# Patient Record
Sex: Female | Born: 1935
Health system: Southern US, Community
[De-identification: ages and names within clinical notes are randomized; demographics above are authoritative.]

## PROBLEM LIST (undated history)

## (undated) DIAGNOSIS — M8580 Other specified disorders of bone density and structure, unspecified site: Secondary | ICD-10-CM

## (undated) DIAGNOSIS — E785 Hyperlipidemia, unspecified: Secondary | ICD-10-CM

## (undated) DIAGNOSIS — M65842 Other synovitis and tenosynovitis, left hand: Secondary | ICD-10-CM

## (undated) DIAGNOSIS — D649 Anemia, unspecified: Secondary | ICD-10-CM

## (undated) DIAGNOSIS — E119 Type 2 diabetes mellitus without complications: Secondary | ICD-10-CM

## (undated) DIAGNOSIS — N289 Disorder of kidney and ureter, unspecified: Secondary | ICD-10-CM

## (undated) DIAGNOSIS — R569 Unspecified convulsions: Secondary | ICD-10-CM

## (undated) DIAGNOSIS — Z85038 Personal history of other malignant neoplasm of large intestine: Secondary | ICD-10-CM

## (undated) DIAGNOSIS — I639 Cerebral infarction, unspecified: Secondary | ICD-10-CM

## (undated) HISTORY — PX: REFRACTIVE SURGERY: SHX103

## (undated) HISTORY — DX: Personal history of other malignant neoplasm of large intestine: Z85.038

## (undated) HISTORY — DX: Anemia, unspecified: D64.9

## (undated) HISTORY — PX: CARPAL TUNNEL RELEASE: SHX101

## (undated) HISTORY — PX: TONSILLECTOMY AND ADENOIDECTOMY: SUR1326

## (undated) HISTORY — DX: Disorder of kidney and ureter, unspecified: N28.9

## (undated) HISTORY — DX: Type 2 diabetes mellitus without complications: E11.9

## (undated) HISTORY — DX: Other synovitis and tenosynovitis, left hand: M65.842

## (undated) HISTORY — DX: Cerebral infarction, unspecified: I63.9

## (undated) HISTORY — PX: APPENDECTOMY: SHX54

## (undated) HISTORY — DX: Other specified disorders of bone density and structure, unspecified site: M85.80

## (undated) HISTORY — DX: Hyperlipidemia, unspecified: E78.5

## (undated) HISTORY — DX: Unspecified convulsions: R56.9

## (undated) HISTORY — PX: OTHER SURGICAL HISTORY: SHX169

---

## 1996-06-04 DIAGNOSIS — Z85038 Personal history of other malignant neoplasm of large intestine: Secondary | ICD-10-CM

## 1996-06-04 HISTORY — DX: Personal history of other malignant neoplasm of large intestine: Z85.038

## 1996-06-04 HISTORY — PX: COLECTOMY: SHX59

## 1997-11-19 ENCOUNTER — Other Ambulatory Visit: Admission: RE | Admit: 1997-11-19 | Discharge: 1997-11-19 | Payer: Self-pay | Admitting: Gynecology

## 1999-03-29 ENCOUNTER — Ambulatory Visit (HOSPITAL_COMMUNITY): Admission: RE | Admit: 1999-03-29 | Discharge: 1999-03-29 | Payer: Self-pay | Admitting: Internal Medicine

## 1999-03-29 ENCOUNTER — Encounter: Payer: Self-pay | Admitting: Internal Medicine

## 1999-05-03 ENCOUNTER — Other Ambulatory Visit: Admission: RE | Admit: 1999-05-03 | Discharge: 1999-05-03 | Payer: Self-pay | Admitting: Obstetrics & Gynecology

## 2000-02-23 ENCOUNTER — Encounter: Admission: RE | Admit: 2000-02-23 | Discharge: 2000-03-01 | Payer: Self-pay | Admitting: Orthopedic Surgery

## 2002-07-15 ENCOUNTER — Other Ambulatory Visit: Admission: RE | Admit: 2002-07-15 | Discharge: 2002-07-15 | Payer: Self-pay | Admitting: Obstetrics & Gynecology

## 2004-04-18 ENCOUNTER — Ambulatory Visit: Payer: Self-pay | Admitting: Internal Medicine

## 2004-05-05 ENCOUNTER — Ambulatory Visit: Payer: Self-pay | Admitting: Internal Medicine

## 2004-06-04 HISTORY — PX: CATARACT EXTRACTION: SUR2

## 2004-08-02 ENCOUNTER — Other Ambulatory Visit: Admission: RE | Admit: 2004-08-02 | Discharge: 2004-08-02 | Payer: Self-pay | Admitting: Obstetrics & Gynecology

## 2004-08-21 ENCOUNTER — Ambulatory Visit: Payer: Self-pay | Admitting: Internal Medicine

## 2004-12-21 ENCOUNTER — Ambulatory Visit: Payer: Self-pay | Admitting: Internal Medicine

## 2004-12-26 ENCOUNTER — Ambulatory Visit: Payer: Self-pay | Admitting: Internal Medicine

## 2005-04-05 ENCOUNTER — Ambulatory Visit: Payer: Self-pay | Admitting: Internal Medicine

## 2005-08-21 ENCOUNTER — Ambulatory Visit: Payer: Self-pay | Admitting: Internal Medicine

## 2005-09-03 ENCOUNTER — Ambulatory Visit: Payer: Self-pay | Admitting: Internal Medicine

## 2006-03-11 ENCOUNTER — Ambulatory Visit: Payer: Self-pay | Admitting: Internal Medicine

## 2006-04-05 ENCOUNTER — Ambulatory Visit: Payer: Self-pay | Admitting: Internal Medicine

## 2006-05-03 ENCOUNTER — Ambulatory Visit: Payer: Self-pay | Admitting: Internal Medicine

## 2006-06-04 HISTORY — PX: COLONOSCOPY: SHX174

## 2006-06-19 ENCOUNTER — Ambulatory Visit: Payer: Self-pay | Admitting: Internal Medicine

## 2006-06-19 LAB — CONVERTED CEMR LAB
ALT: 23 units/L (ref 0–40)
AST: 20 units/L (ref 0–37)
BUN: 14 mg/dL (ref 6–23)
Creatinine, Ser: 0.9 mg/dL (ref 0.4–1.2)
Hgb A1c MFr Bld: 6.9 % — ABNORMAL HIGH (ref 4.6–6.0)
Triglycerides: 122 mg/dL (ref 0–149)
VLDL: 24 mg/dL (ref 0–40)

## 2006-07-15 ENCOUNTER — Ambulatory Visit: Payer: Self-pay | Admitting: Internal Medicine

## 2006-10-22 ENCOUNTER — Ambulatory Visit: Payer: Self-pay | Admitting: Internal Medicine

## 2006-10-22 LAB — CONVERTED CEMR LAB
AST: 23 units/L (ref 0–37)
Albumin: 3.9 g/dL (ref 3.5–5.2)
Alkaline Phosphatase: 58 units/L (ref 39–117)
BUN: 11 mg/dL (ref 6–23)
Basophils Absolute: 0 10*3/uL (ref 0.0–0.1)
Calcium: 9.7 mg/dL (ref 8.4–10.5)
Chloride: 109 meq/L (ref 96–112)
Creatinine, Ser: 0.9 mg/dL (ref 0.4–1.2)
Creatinine,U: 67.4 mg/dL
Eosinophils Absolute: 0.2 10*3/uL (ref 0.0–0.6)
GFR calc non Af Amer: 65 mL/min
HCT: 39.3 % (ref 36.0–46.0)
Hgb A1c MFr Bld: 7 % — ABNORMAL HIGH (ref 4.6–6.0)
MCHC: 33.9 g/dL (ref 30.0–36.0)
MCV: 91.5 fL (ref 78.0–100.0)
Monocytes Relative: 9.9 % (ref 3.0–11.0)
Platelets: 206 10*3/uL (ref 150–400)
RBC: 4.29 M/uL (ref 3.87–5.11)
RDW: 12.6 % (ref 11.5–14.6)
Sodium: 141 meq/L (ref 135–145)
TSH: 1.86 microintl units/mL (ref 0.35–5.50)
Total Bilirubin: 0.9 mg/dL (ref 0.3–1.2)

## 2006-10-23 DIAGNOSIS — E785 Hyperlipidemia, unspecified: Secondary | ICD-10-CM

## 2006-10-23 DIAGNOSIS — E1165 Type 2 diabetes mellitus with hyperglycemia: Secondary | ICD-10-CM | POA: Insufficient documentation

## 2006-10-29 ENCOUNTER — Encounter: Payer: Self-pay | Admitting: Internal Medicine

## 2006-10-29 ENCOUNTER — Ambulatory Visit: Payer: Self-pay | Admitting: Internal Medicine

## 2006-12-02 ENCOUNTER — Encounter: Payer: Self-pay | Admitting: Internal Medicine

## 2007-01-15 ENCOUNTER — Ambulatory Visit: Payer: Self-pay | Admitting: Gastroenterology

## 2007-01-30 ENCOUNTER — Encounter: Payer: Self-pay | Admitting: Internal Medicine

## 2007-01-30 ENCOUNTER — Ambulatory Visit: Payer: Self-pay | Admitting: Gastroenterology

## 2007-01-30 ENCOUNTER — Encounter: Payer: Self-pay | Admitting: Gastroenterology

## 2007-04-24 ENCOUNTER — Ambulatory Visit: Payer: Self-pay | Admitting: Internal Medicine

## 2007-06-02 ENCOUNTER — Telehealth (INDEPENDENT_AMBULATORY_CARE_PROVIDER_SITE_OTHER): Payer: Self-pay | Admitting: *Deleted

## 2007-09-03 ENCOUNTER — Encounter: Payer: Self-pay | Admitting: Internal Medicine

## 2007-09-05 ENCOUNTER — Telehealth (INDEPENDENT_AMBULATORY_CARE_PROVIDER_SITE_OTHER): Payer: Self-pay | Admitting: *Deleted

## 2007-11-13 ENCOUNTER — Ambulatory Visit: Payer: Self-pay | Admitting: Internal Medicine

## 2007-11-13 DIAGNOSIS — M8580 Other specified disorders of bone density and structure, unspecified site: Secondary | ICD-10-CM | POA: Insufficient documentation

## 2007-11-13 DIAGNOSIS — Z85038 Personal history of other malignant neoplasm of large intestine: Secondary | ICD-10-CM | POA: Insufficient documentation

## 2007-11-13 DIAGNOSIS — H4020X0 Unspecified primary angle-closure glaucoma, stage unspecified: Secondary | ICD-10-CM | POA: Insufficient documentation

## 2007-11-24 ENCOUNTER — Encounter: Payer: Self-pay | Admitting: Internal Medicine

## 2008-01-28 ENCOUNTER — Ambulatory Visit: Payer: Self-pay | Admitting: Internal Medicine

## 2008-01-28 ENCOUNTER — Encounter (INDEPENDENT_AMBULATORY_CARE_PROVIDER_SITE_OTHER): Payer: Self-pay | Admitting: *Deleted

## 2008-01-28 LAB — CONVERTED CEMR LAB: OCCULT 2: NEGATIVE

## 2008-02-25 DIAGNOSIS — C4491 Basal cell carcinoma of skin, unspecified: Secondary | ICD-10-CM

## 2008-02-25 HISTORY — DX: Basal cell carcinoma of skin, unspecified: C44.91

## 2008-03-10 ENCOUNTER — Ambulatory Visit: Payer: Self-pay | Admitting: Internal Medicine

## 2008-03-15 ENCOUNTER — Encounter (INDEPENDENT_AMBULATORY_CARE_PROVIDER_SITE_OTHER): Payer: Self-pay | Admitting: *Deleted

## 2008-04-20 ENCOUNTER — Telehealth (INDEPENDENT_AMBULATORY_CARE_PROVIDER_SITE_OTHER): Payer: Self-pay | Admitting: *Deleted

## 2008-06-14 ENCOUNTER — Telehealth (INDEPENDENT_AMBULATORY_CARE_PROVIDER_SITE_OTHER): Payer: Self-pay | Admitting: *Deleted

## 2008-09-07 ENCOUNTER — Ambulatory Visit: Payer: Self-pay | Admitting: Internal Medicine

## 2008-09-07 LAB — CONVERTED CEMR LAB: Hgb A1c MFr Bld: 6.9 % — ABNORMAL HIGH (ref 4.6–6.5)

## 2008-09-09 ENCOUNTER — Telehealth (INDEPENDENT_AMBULATORY_CARE_PROVIDER_SITE_OTHER): Payer: Self-pay | Admitting: *Deleted

## 2008-09-09 ENCOUNTER — Encounter (INDEPENDENT_AMBULATORY_CARE_PROVIDER_SITE_OTHER): Payer: Self-pay | Admitting: *Deleted

## 2008-10-04 ENCOUNTER — Ambulatory Visit: Payer: Self-pay | Admitting: Internal Medicine

## 2008-10-15 ENCOUNTER — Encounter (INDEPENDENT_AMBULATORY_CARE_PROVIDER_SITE_OTHER): Payer: Self-pay | Admitting: *Deleted

## 2008-10-21 ENCOUNTER — Encounter: Payer: Self-pay | Admitting: Internal Medicine

## 2008-11-15 ENCOUNTER — Encounter: Payer: Self-pay | Admitting: Internal Medicine

## 2009-01-17 ENCOUNTER — Ambulatory Visit: Payer: Self-pay | Admitting: Internal Medicine

## 2009-01-17 LAB — CONVERTED CEMR LAB
ALT: 14 U/L (ref 0–35)
AST: 17 U/L (ref 0–37)
Albumin: 3.8 g/dL (ref 3.5–5.2)
Alkaline Phosphatase: 49 U/L (ref 39–117)
BUN: 22 mg/dL (ref 6–23)
Basophils Absolute: 0.1 K/uL (ref 0.0–0.1)
Basophils Relative: 1.1 % (ref 0.0–3.0)
Bilirubin, Direct: 0 mg/dL (ref 0.0–0.3)
CO2: 22 meq/L (ref 19–32)
Calcium: 9.3 mg/dL (ref 8.4–10.5)
Chloride: 112 meq/L (ref 96–112)
Cholesterol: 122 mg/dL (ref 0–200)
Creatinine, Ser: 1.3 mg/dL — ABNORMAL HIGH (ref 0.4–1.2)
Creatinine,U: 124.3 mg/dL
Eosinophils Absolute: 0.6 K/uL (ref 0.0–0.7)
Eosinophils Relative: 8.7 % — ABNORMAL HIGH (ref 0.0–5.0)
GFR calc non Af Amer: 42.49 mL/min (ref >60)
Glucose, Bld: 63 mg/dL — ABNORMAL LOW (ref 70–99)
HCT: 34.3 % — ABNORMAL LOW (ref 36.0–46.0)
HDL: 36.5 mg/dL — ABNORMAL LOW (ref >39.00)
Hemoglobin: 11.5 g/dL — ABNORMAL LOW (ref 12.0–15.0)
Hgb A1c MFr Bld: 6.3 % (ref 4.6–6.5)
LDL Cholesterol: 68 mg/dL (ref 0–99)
Lymphocytes Relative: 29.2 % (ref 12.0–46.0)
Lymphs Abs: 2.1 K/uL (ref 0.7–4.0)
MCHC: 33.4 g/dL (ref 30.0–36.0)
MCV: 91.3 fL (ref 78.0–100.0)
Microalb Creat Ratio: 29.8 mg/g (ref 0.0–30.0)
Microalb, Ur: 3.7 mg/dL — ABNORMAL HIGH (ref 0.0–1.9)
Monocytes Absolute: 0.6 K/uL (ref 0.1–1.0)
Monocytes Relative: 8.8 % (ref 3.0–12.0)
Neutro Abs: 3.8 K/uL (ref 1.4–7.7)
Neutrophils Relative %: 52.2 % (ref 43.0–77.0)
Platelets: 163 K/uL (ref 150.0–400.0)
Potassium: 4.3 meq/L (ref 3.5–5.1)
RBC: 3.76 M/uL — ABNORMAL LOW (ref 3.87–5.11)
RDW: 13.9 % (ref 11.5–14.6)
Sodium: 142 meq/L (ref 135–145)
TSH: 1.75 u[IU]/mL (ref 0.35–5.50)
Total Bilirubin: 0.7 mg/dL (ref 0.3–1.2)
Total CHOL/HDL Ratio: 3
Total Protein: 6.7 g/dL (ref 6.0–8.3)
Triglycerides: 87 mg/dL (ref 0.0–149.0)
VLDL: 17.4 mg/dL (ref 0.0–40.0)
WBC: 7.2 10*3/microliter (ref 4.5–10.5)

## 2009-01-21 ENCOUNTER — Telehealth: Payer: Self-pay | Admitting: Internal Medicine

## 2009-01-24 ENCOUNTER — Ambulatory Visit: Payer: Self-pay | Admitting: Internal Medicine

## 2009-01-24 DIAGNOSIS — N183 Chronic kidney disease, stage 3 unspecified: Secondary | ICD-10-CM | POA: Insufficient documentation

## 2009-01-24 DIAGNOSIS — D62 Acute posthemorrhagic anemia: Secondary | ICD-10-CM | POA: Insufficient documentation

## 2009-01-24 DIAGNOSIS — N189 Chronic kidney disease, unspecified: Secondary | ICD-10-CM | POA: Insufficient documentation

## 2009-01-24 DIAGNOSIS — E1122 Type 2 diabetes mellitus with diabetic chronic kidney disease: Secondary | ICD-10-CM

## 2009-02-08 ENCOUNTER — Ambulatory Visit: Payer: Self-pay | Admitting: Internal Medicine

## 2009-02-08 ENCOUNTER — Encounter (INDEPENDENT_AMBULATORY_CARE_PROVIDER_SITE_OTHER): Payer: Self-pay | Admitting: *Deleted

## 2009-02-08 LAB — CONVERTED CEMR LAB: OCCULT 2: NEGATIVE

## 2009-03-31 ENCOUNTER — Ambulatory Visit: Payer: Self-pay | Admitting: Internal Medicine

## 2009-05-05 ENCOUNTER — Telehealth (INDEPENDENT_AMBULATORY_CARE_PROVIDER_SITE_OTHER): Payer: Self-pay | Admitting: *Deleted

## 2009-05-18 ENCOUNTER — Ambulatory Visit: Payer: Self-pay | Admitting: Internal Medicine

## 2009-05-23 LAB — CONVERTED CEMR LAB
Basophils Relative: 1 % (ref 0.0–3.0)
Eosinophils Absolute: 0.2 10*3/uL (ref 0.0–0.7)
Hemoglobin: 11.9 g/dL — ABNORMAL LOW (ref 12.0–15.0)
Lymphocytes Relative: 29 % (ref 12.0–46.0)
MCHC: 33 g/dL (ref 30.0–36.0)
MCV: 91.9 fL (ref 78.0–100.0)
Neutro Abs: 4.5 10*3/uL (ref 1.4–7.7)
RBC: 3.93 M/uL (ref 3.87–5.11)

## 2009-06-08 ENCOUNTER — Ambulatory Visit: Payer: Self-pay | Admitting: Internal Medicine

## 2009-06-08 LAB — CONVERTED CEMR LAB
Ketones, urine, test strip: NEGATIVE
Nitrite: NEGATIVE
Protein, U semiquant: NEGATIVE
Urobilinogen, UA: 0.2

## 2009-06-09 ENCOUNTER — Encounter: Payer: Self-pay | Admitting: Internal Medicine

## 2009-06-10 ENCOUNTER — Telehealth (INDEPENDENT_AMBULATORY_CARE_PROVIDER_SITE_OTHER): Payer: Self-pay | Admitting: *Deleted

## 2009-06-10 ENCOUNTER — Encounter (INDEPENDENT_AMBULATORY_CARE_PROVIDER_SITE_OTHER): Payer: Self-pay | Admitting: *Deleted

## 2009-06-10 LAB — CONVERTED CEMR LAB
BUN: 22 mg/dL (ref 6–23)
Hgb A1c MFr Bld: 6.6 % — ABNORMAL HIGH (ref 4.6–6.5)
Potassium: 5.3 meq/L — ABNORMAL HIGH (ref 3.5–5.1)

## 2009-06-13 ENCOUNTER — Ambulatory Visit: Payer: Self-pay | Admitting: Internal Medicine

## 2009-06-13 ENCOUNTER — Telehealth: Payer: Self-pay | Admitting: Internal Medicine

## 2009-06-13 ENCOUNTER — Encounter: Admission: RE | Admit: 2009-06-13 | Discharge: 2009-06-13 | Payer: Self-pay | Admitting: Internal Medicine

## 2009-06-13 DIAGNOSIS — R1900 Intra-abdominal and pelvic swelling, mass and lump, unspecified site: Secondary | ICD-10-CM | POA: Insufficient documentation

## 2009-06-13 DIAGNOSIS — I10 Essential (primary) hypertension: Secondary | ICD-10-CM | POA: Insufficient documentation

## 2009-06-14 ENCOUNTER — Telehealth (INDEPENDENT_AMBULATORY_CARE_PROVIDER_SITE_OTHER): Payer: Self-pay | Admitting: *Deleted

## 2009-06-14 ENCOUNTER — Encounter (INDEPENDENT_AMBULATORY_CARE_PROVIDER_SITE_OTHER): Payer: Self-pay | Admitting: *Deleted

## 2009-06-15 ENCOUNTER — Encounter: Payer: Self-pay | Admitting: Internal Medicine

## 2009-06-16 ENCOUNTER — Ambulatory Visit (HOSPITAL_COMMUNITY): Admission: RE | Admit: 2009-06-16 | Discharge: 2009-06-16 | Payer: Self-pay | Admitting: Urology

## 2009-07-28 ENCOUNTER — Ambulatory Visit: Payer: Self-pay | Admitting: Internal Medicine

## 2009-07-28 LAB — CONVERTED CEMR LAB
BUN: 21 mg/dL (ref 6–23)
Creatinine, Ser: 1.3 mg/dL — ABNORMAL HIGH (ref 0.4–1.2)

## 2009-08-04 ENCOUNTER — Ambulatory Visit: Payer: Self-pay | Admitting: Internal Medicine

## 2009-08-04 DIAGNOSIS — Z794 Long term (current) use of insulin: Secondary | ICD-10-CM

## 2009-08-04 DIAGNOSIS — E1121 Type 2 diabetes mellitus with diabetic nephropathy: Secondary | ICD-10-CM

## 2009-08-04 DIAGNOSIS — E118 Type 2 diabetes mellitus with unspecified complications: Secondary | ICD-10-CM | POA: Insufficient documentation

## 2009-08-04 DIAGNOSIS — E119 Type 2 diabetes mellitus without complications: Secondary | ICD-10-CM | POA: Insufficient documentation

## 2009-08-10 ENCOUNTER — Telehealth: Payer: Self-pay | Admitting: Internal Medicine

## 2009-08-19 ENCOUNTER — Ambulatory Visit: Payer: Self-pay | Admitting: Internal Medicine

## 2009-08-22 ENCOUNTER — Ambulatory Visit: Payer: Self-pay | Admitting: Internal Medicine

## 2009-08-22 LAB — CONVERTED CEMR LAB
ALT: 13 units/L (ref 0–35)
AST: 17 units/L (ref 0–37)
Alkaline Phosphatase: 69 units/L (ref 39–117)
Bilirubin, Direct: 0.1 mg/dL (ref 0.0–0.3)
Creatinine, Ser: 1.5 mg/dL — ABNORMAL HIGH (ref 0.4–1.2)
Creatinine,U: 91.2 mg/dL
Hgb A1c MFr Bld: 9.8 % — ABNORMAL HIGH (ref 4.6–6.5)
LDL Cholesterol: 97 mg/dL (ref 0–99)
Microalb Creat Ratio: 30.7 mg/g — ABNORMAL HIGH (ref 0.0–30.0)
Microalb, Ur: 2.8 mg/dL — ABNORMAL HIGH (ref 0.0–1.9)
Total CHOL/HDL Ratio: 3
Total Protein: 7.2 g/dL (ref 6.0–8.3)

## 2009-09-26 ENCOUNTER — Ambulatory Visit: Payer: Self-pay | Admitting: Internal Medicine

## 2009-09-29 LAB — CONVERTED CEMR LAB
Creatinine,U: 67.7 mg/dL
Microalb Creat Ratio: 32.5 mg/g — ABNORMAL HIGH (ref 0.0–30.0)

## 2009-10-03 ENCOUNTER — Ambulatory Visit: Payer: Self-pay | Admitting: Internal Medicine

## 2009-10-03 DIAGNOSIS — IMO0001 Reserved for inherently not codable concepts without codable children: Secondary | ICD-10-CM | POA: Insufficient documentation

## 2009-11-14 ENCOUNTER — Telehealth (INDEPENDENT_AMBULATORY_CARE_PROVIDER_SITE_OTHER): Payer: Self-pay | Admitting: *Deleted

## 2009-11-16 ENCOUNTER — Encounter: Payer: Self-pay | Admitting: Internal Medicine

## 2009-11-23 ENCOUNTER — Telehealth (INDEPENDENT_AMBULATORY_CARE_PROVIDER_SITE_OTHER): Payer: Self-pay | Admitting: *Deleted

## 2009-11-25 ENCOUNTER — Encounter (INDEPENDENT_AMBULATORY_CARE_PROVIDER_SITE_OTHER): Payer: Self-pay | Admitting: *Deleted

## 2009-12-13 ENCOUNTER — Ambulatory Visit: Payer: Self-pay | Admitting: Internal Medicine

## 2009-12-13 LAB — CONVERTED CEMR LAB
Albumin: 3.9 g/dL (ref 3.5–5.2)
BUN: 36 mg/dL — ABNORMAL HIGH (ref 6–23)
Basophils Absolute: 0.1 10*3/uL (ref 0.0–0.1)
Basophils Relative: 0.7 % (ref 0.0–3.0)
Cholesterol: 167 mg/dL (ref 0–200)
Eosinophils Absolute: 0.2 10*3/uL (ref 0.0–0.7)
Hemoglobin: 10.8 g/dL — ABNORMAL LOW (ref 12.0–15.0)
LDL Cholesterol: 100 mg/dL — ABNORMAL HIGH (ref 0–99)
Lymphocytes Relative: 28.3 % (ref 12.0–46.0)
MCHC: 33.6 g/dL (ref 30.0–36.0)
MCV: 81.9 fL (ref 78.0–100.0)
Monocytes Absolute: 0.7 10*3/uL (ref 0.1–1.0)
Neutro Abs: 4.6 10*3/uL (ref 1.4–7.7)
Potassium: 4.5 meq/L (ref 3.5–5.1)
RBC: 3.94 M/uL (ref 3.87–5.11)
RDW: 16.5 % — ABNORMAL HIGH (ref 11.5–14.6)
Total Bilirubin: 0.6 mg/dL (ref 0.3–1.2)
Triglycerides: 126 mg/dL (ref 0.0–149.0)
VLDL: 25.2 mg/dL (ref 0.0–40.0)

## 2009-12-16 ENCOUNTER — Ambulatory Visit: Payer: Self-pay | Admitting: Internal Medicine

## 2009-12-26 ENCOUNTER — Encounter: Payer: Self-pay | Admitting: Internal Medicine

## 2010-01-04 ENCOUNTER — Encounter: Payer: Self-pay | Admitting: Internal Medicine

## 2010-01-04 ENCOUNTER — Encounter (INDEPENDENT_AMBULATORY_CARE_PROVIDER_SITE_OTHER): Payer: Self-pay | Admitting: *Deleted

## 2010-03-06 ENCOUNTER — Encounter: Payer: Self-pay | Admitting: Internal Medicine

## 2010-03-09 ENCOUNTER — Encounter (INDEPENDENT_AMBULATORY_CARE_PROVIDER_SITE_OTHER): Payer: Self-pay | Admitting: *Deleted

## 2010-03-13 ENCOUNTER — Ambulatory Visit: Payer: Self-pay | Admitting: Gastroenterology

## 2010-03-27 ENCOUNTER — Ambulatory Visit: Payer: Self-pay | Admitting: Gastroenterology

## 2010-04-18 ENCOUNTER — Ambulatory Visit: Payer: Self-pay | Admitting: Internal Medicine

## 2010-04-24 LAB — CONVERTED CEMR LAB
Basophils Relative: 0.5 % (ref 0.0–3.0)
Creatinine, Ser: 1.4 mg/dL — ABNORMAL HIGH (ref 0.4–1.2)
Eosinophils Absolute: 0.2 10*3/uL (ref 0.0–0.7)
Eosinophils Relative: 2.7 % (ref 0.0–5.0)
Folate: >20 ng/mL
HCT: 33.9 % — ABNORMAL LOW (ref 36.0–46.0)
Hemoglobin: 11.1 g/dL — ABNORMAL LOW (ref 12.0–15.0)
Lymphs Abs: 1.8 10*3/uL (ref 0.7–4.0)
MCHC: 32.6 g/dL (ref 30.0–36.0)
MCV: 82.3 fL (ref 78.0–100.0)
Monocytes Absolute: 0.6 10*3/uL (ref 0.1–1.0)
Neutro Abs: 4.5 10*3/uL (ref 1.4–7.7)
RBC: 4.12 M/uL (ref 3.87–5.11)
WBC: 7.1 10*3/uL (ref 4.5–10.5)

## 2010-04-25 ENCOUNTER — Ambulatory Visit: Payer: Self-pay | Admitting: Internal Medicine

## 2010-06-19 ENCOUNTER — Ambulatory Visit
Admission: RE | Admit: 2010-06-19 | Discharge: 2010-06-19 | Payer: Self-pay | Source: Home / Self Care | Attending: Internal Medicine | Admitting: Internal Medicine

## 2010-06-19 ENCOUNTER — Other Ambulatory Visit: Payer: Self-pay | Admitting: Internal Medicine

## 2010-06-19 LAB — POTASSIUM: Potassium: 4.5 mEq/L (ref 3.5–5.1)

## 2010-06-19 LAB — BUN: BUN: 31 mg/dL — ABNORMAL HIGH (ref 6–23)

## 2010-06-19 LAB — CREATININE, SERUM: Creatinine, Ser: 1.3 mg/dL — ABNORMAL HIGH (ref 0.4–1.2)

## 2010-06-19 LAB — HEMOGLOBIN A1C: Hgb A1c MFr Bld: 10.7 % — ABNORMAL HIGH (ref 4.6–6.5)

## 2010-06-26 ENCOUNTER — Ambulatory Visit
Admission: RE | Admit: 2010-06-26 | Discharge: 2010-06-26 | Payer: Self-pay | Source: Home / Self Care | Attending: Internal Medicine | Admitting: Internal Medicine

## 2010-06-28 ENCOUNTER — Telehealth: Payer: Self-pay | Admitting: Internal Medicine

## 2010-06-29 ENCOUNTER — Encounter: Payer: Self-pay | Admitting: Internal Medicine

## 2010-07-02 LAB — CONVERTED CEMR LAB
Albumin: 4.1 g/dL (ref 3.5–5.2)
BUN: 16 mg/dL (ref 6–23)
Basophils Absolute: 0 10*3/uL (ref 0.0–0.1)
Basophils Absolute: 0.1 10*3/uL (ref 0.0–0.1)
Basophils Relative: 0.5 % (ref 0.0–1.0)
Cholesterol: 141 mg/dL (ref 0–200)
Creatinine, Ser: 0.9 mg/dL (ref 0.4–1.2)
Creatinine,U: 113.9 mg/dL
Eosinophils Absolute: 0.2 10*3/uL (ref 0.0–0.7)
Eosinophils Relative: 2.5 % (ref 0.0–5.0)
GFR calc Af Amer: 79 mL/min
GFR calc non Af Amer: 65 mL/min
HCT: 39.4 % (ref 36.0–46.0)
HDL: 35 mg/dL — ABNORMAL LOW (ref >39.0)
Hgb A1c MFr Bld: 6.5 % — ABNORMAL HIGH (ref 4.6–6.0)
Iron: 76 ug/dL (ref 42–145)
Lymphocytes Relative: 26.4 % (ref 12.0–46.0)
MCHC: 33.7 g/dL (ref 30.0–36.0)
MCV: 91.9 fL (ref 78.0–100.0)
Microalb Creat Ratio: 19.3 mg/g (ref 0.0–30.0)
Microalb, Ur: 2.2 mg/dL — ABNORMAL HIGH (ref 0.0–1.9)
Monocytes Absolute: 0.6 10*3/uL (ref 0.1–1.0)
Monocytes Relative: 8.1 % (ref 3.0–12.0)
Platelets: 172 10*3/uL (ref 150.0–400.0)
Platelets: 206 10*3/uL (ref 150–400)
RBC: 4.29 M/uL (ref 3.87–5.11)
RDW: 14 % (ref 11.5–14.6)
Total Bilirubin: 0.8 mg/dL (ref 0.3–1.2)
Transferrin: 271 mg/dL (ref 212.0–360.0)
VLDL: 20 mg/dL (ref 0–40)
Vitamin B-12: 872 pg/mL (ref 211–911)
WBC: 7.1 10*3/uL (ref 4.5–10.5)

## 2010-07-06 NOTE — Progress Notes (Signed)
Summary: Refill Request  Phone Note Refill Request Message from:  Patient on November 14, 2009 8:39 AM  Refills Requested: Medication #1:  LIPITOR 40 MG  TABS take one tablet at bedtime   Dosage confirmed as above?Dosage Confirmed   Supply Requested: 1 month Please call in rx to [facility] in [place]. Patients phone number [phone]  Initial call taken by: Harold Barban,  November 14, 2009 8:41 AM    Prescriptions: LIPITOR 40 MG  TABS (ATORVASTATIN CALCIUM) take one tablet at bedtime  #90 x 3   Entered by:   Shonna Chock   Authorized by:   Marga Melnick MD   Signed by:   Shonna Chock on 11/14/2009   Method used:   Electronically to        [facility]. [address] [phone]* (retail)       [address], [place]  [address]       Ph: [phone]       Fax: [phone]   RxID:   [id number]

## 2010-07-06 NOTE — Consult Note (Signed)
Summary: [facility]  [facility]   Imported By: Lanelle Bal 06/27/2009 10:37:09  _____________________________________________________________________  External Attachment:    Type:   Image     Comment:   External Document

## 2010-07-06 NOTE — Progress Notes (Signed)
Summary: start med  Phone Note Call from Patient Call back at [phone]   Caller: Daughter Summary of Call: pt was rx 2 meds today and is unsure if she still need to get these med since see is not going to get CT of the abdomen and pelvis. pt rx HYDROCODONE-ACETAMINOPHEN 5-500 MG TAB dr hopper pls advise  Follow-up for Phone Call        pt aware. to pick med up if pain persist  and other med is bp med...........Marland KitchenFelecia Deloach CMA  June 13, 2009 5:28 PM

## 2010-07-06 NOTE — Assessment & Plan Note (Signed)
Summary: rto 4 months/cbs   Vital Signs:  Patient profile:   77 year old female Weight:      133.6 pounds BMI:     26.19 Pulse rate:   72 / minute Resp:     16 per minute BP sitting:   136 / 70  (left arm) Cuff size:   large  Vitals Entered By: Shonna Chock CMA (April 25, 2010 8:59 AM) CC: 4 month follow-up ( copy of labs given) , Diarrhea, Type 2 diabetes mellitus follow-up   CC:  4 month follow-up ( copy of labs given) , Diarrhea, and Type 2 diabetes mellitus follow-up.  History of Present Illness:       Labs reviewed ; severely uncontrolled DM .A1c 10.6% which means average sugar = 258 & risk 112%. She increased Lantus to 10 u in am & 5 u at bedtime  rather than continuing once daily dosing.        An acute interval issue is diarrhea.She had diarrhea  only on 11/19  .  The patient reports 4 stools per day and semiformed/loose stools, "black" but w/o  blood in stool, mucus in stool, greasy stools, malodorous stools, or  fecal urgency.  Associated symptoms include nausea and vomiting X 2 on 11/19.  The patient denied  fever, abdominal pain, abdominal cramps, and lightheadedness. Rx: Pepto - Bismol with response.  She is now just weak. Type 2 Diabetes Mellitus Follow-Up        The patient reports weight loss, but denies polyuria, polydipsia, blurred vision, self managed hypoglycemia, and numbness of extremities.  The patient denies the following symptoms: neuropathic pain, chest pain, orthostatic symptoms, poor wound healing, vision loss, and foot ulcer.  Since the last visit the patient reports fair  dietary compliance and not exercising regularly.  The patient has been measuring capillary blood glucose before breakfast ( 150-170), 2 hrs after  lunch  and after dinner( < 280).    Current Medications (verified): 1)  Januvia 100 Mg  Tabs (Sitagliptin Phosphate) .... Take 1/2  Tablet By Mouth Every Day 2)  Freestyle Lite   Strp (Glucose Blood) .... Check Up To Three Times Daily As  Directed 3)  Lipitor 40 Mg  Tabs (Atorvastatin Calcium) .... Take One Tablet At Bedtime 4)  Ramipril 10 .Marland Kitchen.. 1 Once Daily 5)  Carvedilol 12.5 Mg Tabs (Carvedilol) .... Take 1 Tab Two Times A Day 6)  Lantus Solostar 100 Unit/ml Soln (Insulin Glargine) .Marland Kitchen.. 10 Units in Am, 5 Units Pm 7)  Bd Pen Needles 31g X 5/16 .Marland Kitchen.. 10units Daily and Increase By 2 Nightly Util Fbs Is  150-180 On Average .  Dx: 250.00 8)  Furosemide 40 Mg Tabs (Furosemide) .... Take 1 Tab Once Daily 9)  Osteo Bi-Flex Joint Shield  Tabs (Misc Natural Products) .Marland Kitchen.. 1 By Mouth Once Daily 10)  Aspirin 81 Mg Tabs (Aspirin) .Marland Kitchen.. 1 By Mouth Once Daily 11)  Vitamin D3 1000 Unit Tabs (Cholecalciferol) .Marland Kitchen.. 1 By Mouth Once Daily 12)  Folic Acid 400 Mcg Tabs (Folic Acid) .Marland Kitchen.. 1 By Mouth Once Daily 13)  Fish Oil 1000 Mg Caps (Omega-3 Fatty Acids) .Marland Kitchen.. 1 By Mouth Once Daily 14)  Calcium 600 Mg Tabs (Calcium) .Marland Kitchen.. 1 By Mouth Once Daily  Allergies: 1)  ! Pravachol  Physical Exam  General:  well-nourished,in no acute distress; alert,appropriate and cooperative throughout examination Eyes:  No corneal or conjunctival inflammation noted.No icterus Mouth:  Oral mucosa and oropharynx without lesions or exudates.  Tongue slightly dry. No pharyngeal erythema.   Lungs:  Normal respiratory effort, chest expands symmetrically. Lungs are clear to auscultation, no crackles or wheezes. Heart:  normal rate, regular rhythm, no gallop, no rub, no JVD, no HJR, and grade 1/2  /6 systolic murmur.   Abdomen:  Bowel sounds positive,abdomen soft and non-tender without masses, organomegaly or hernias noted. Pulses:  R and L carotid,radia  pulses are full and equal bilaterally. Pedapulses slightly decreased Extremities:  No clubbing, cyanosis, edema. Nails painted Neurologic:  alert & oriented X3 and sensation decreased  to light touch over feet except R sole.   Skin:  Intact without suspicious lesions or rashes. No jaundice. Slight tenting Cervical Nodes:   No lymphadenopathy noted Axillary Nodes:  No palpable lymphadenopathy Psych:  memory intact for recent and remote, normally interactive, and good eye contact.     Impression & Recommendations:  Problem # 1:  DIARRHEA (ICD-787.91) self limited  Problem # 2:  VOMITING (ICD-787.03) self limited  Problem # 3:  DIABETES MELLITUS, UNCONTROLLED (ICD-250.02)  The following medications were removed from the medication list:    Januvia 100 Mg Tabs (Sitagliptin phosphate) .Marland Kitchen... Take 1/2  tablet by mouth every day Her updated medication list for this problem includes:    Lantus Solostar 100 Unit/ml Soln (Insulin glargine) .Marland KitchenMarland KitchenMarland KitchenMarland Kitchen 10 units in am, 5 units pm    Aspirin 81 Mg Tabs (Aspirin) .Marland Kitchen... 1 by mouth once daily    Tradjenta 5 Mg Tabs (Linagliptin) .Marland Kitchen... 1 once daily with largest meal  Problem # 4:  RENAL INSUFFICIENCY (ICD-588.9)  Complete Medication List: 1)  Freestyle Lite Strp (Glucose blood) .... Check up to three times daily as directed 2)  Lipitor 40 Mg Tabs (Atorvastatin calcium) .... Take one tablet at bedtime 3)  Ramipril 10  .Marland Kitchen.. 1 once daily 4)  Carvedilol 12.5 Mg Tabs (Carvedilol) .... Take 1 tab two times a day 5)  Lantus Solostar 100 Unit/ml Soln (Insulin glargine) .Marland Kitchen.. 10 units in am, 5 units pm 6)  Bd Pen Needles 31g X 5/16  .Marland Kitchen.. 10units daily and increase by 2 nightly util fbs is  150-180 on average .  dx: 250.00 7)  Furosemide 40 Mg Tabs (Furosemide) .... Take 1 tab once daily 8)  Osteo Bi-flex Joint Shield Tabs (Misc natural products) .Marland Kitchen.. 1 by mouth once daily 9)  Aspirin 81 Mg Tabs (Aspirin) .Marland Kitchen.. 1 by mouth once daily 10)  Vitamin D3 1000 Unit Tabs (Cholecalciferol) .Marland Kitchen.. 1 by mouth once daily 11)  Folic Acid 400 Mcg Tabs (Folic acid) .Marland Kitchen.. 1 by mouth once daily 12)  Fish Oil 1000 Mg Caps (Omega-3 fatty acids) .Marland Kitchen.. 1 by mouth once daily 13)  Calcium 600 Mg Tabs (Calcium) .Marland Kitchen.. 1 by mouth once daily 14)  Tradjenta 5 Mg Tabs (Linagliptin) .Marland Kitchen.. 1 once daily with largest  meal  Patient Instructions: 1)  Avoid High Fructose Corn Syrup sugar as discussed.  Increase Lantus ONCE daily dose by 2 units / day every Mon if previous week's fasting glucose averages > 150 ( Ex increase to 17 units this week , etc) . 2)  Please schedule a follow-up appointment in 2 months. 3)  BUN,creat ,K+ prior to visit, ICD-9:588.9 4)  HbgA1C prior to visit, ICD-9:250.02 Prescriptions: TRADJENTA 5 MG TABS (LINAGLIPTIN) 1 once daily with largest meal  #30 x 2   Entered and Authorized by:   Marga Melnick MD   Signed by:   Marga Melnick MD on 04/25/2010   Method used:  Print then Give to Patient   RxID:   [id number]    Orders Added: 1)  Est. Patient Level IV [14782]

## 2010-07-06 NOTE — Letter (Signed)
Summary: Results Follow up Letter  [facility]  [address] [place], [place] [address]   Phone: [phone]  Fax: [phone]    03/15/2008 MRN: [id number]  Taylor Castaneda [address] [place], [place]  [address]  Dear [name],  The following are the results of your recent test(s):  Test         Result    Pap Smear:        Normal _____  Not Normal _____ Comments: ______________________________________________________ Cholesterol: LDL(Bad cholesterol):         Your goal is less than:         HDL (Good cholesterol):       Your goal is more than: Comments:  ______________________________________________________ Mammogram:        Normal _____  Not Normal _____ Comments:  ___________________________________________________________________ Hemoccult:        Normal _____  Not normal _______ Comments:    _____________________________________________________________________ Other Tests: PLEASE SEE ATTACHED LABS FROM 03/10/08 AND COMMENTS    We routinely do not discuss normal results over the telephone.  If you desire a copy of the results, or you have any questions about this information we can discuss them at your next office visit.   Sincerely,

## 2010-07-06 NOTE — Assessment & Plan Note (Signed)
Summary: f/u labs   Vital Signs:  Patient profile:   77 year old female Weight:      131.2 pounds Pulse rate:   64 / minute Resp:     17 per minute BP sitting:   148 / 72  (left arm) Cuff size:   large  Vitals Entered By: Shonna Chock (August 22, 2009 2:53 PM) CC: 1.) Follow-up on lbas (copy givne), refill meds if to continue  2.) ? If B/P med causing leg numbness-symptom started when med was increased Comments REVIEWED MED LIST, PATIENT AGREED DOSE AND INSTRUCTION CORRECT    CC:  1.) Follow-up on lbas (copy givne) and refill meds if to continue  2.) ? If B/P med causing leg numbness-symptom started when med was increased.  History of Present Illness: Labs reviewed ; A1c 9.8%, creat 1.5. Creat was 1.8 in 06/2009. BP dropping; average 134/69. FBS also dropping ; the FBS initially was > 250, now 130-180 range on 10 units of insulin.Two hours after a eal up to 400.Taking Januvia in am. No hypoglycemia. She has "semi numbness " of  feet & legs to knees bilaterally since Ramipril , insulin & Carvedilol  started  Allergies: 1)  ! Pravachol  Review of Systems GU:  Denies incontinence. Neuro:  Complains of numbness; denies brief paralysis, tingling, and weakness.  Physical Exam  General:   alert,appropriate and cooperative throughout examination Pulses:  R and L dorsalis pedis and posterior tibial pulses are full and equal bilaterally Extremities:  No clubbing, cyanosis, edema. Neurologic:  alert & oriented X3, strength normal in all extremities, sensation intact to light touch but sensation variable  to pinprick over legs , and DTRs symmetrical and normal.     Impression & Recommendations:  Problem # 1:  DIABETES MELLITUS, UNCONTROLLED (ICD-250.02)  Her updated medication list for this problem includes:    Januvia 100 Mg Tabs (Sitagliptin phosphate) .Marland Kitchen... Take one tablet by mouth every day    Lantus Solostar 100 Unit/ml Soln (Insulin glargine) .Marland KitchenMarland KitchenMarland KitchenMarland Kitchen 10 units at bedtime  Complete  Medication List: 1)  Januvia 100 Mg Tabs (Sitagliptin phosphate) .... Take one tablet by mouth every day 2)  Freestyle Lite Strp (Glucose blood) .... Check up to three times daily as directed 3)  Lipitor 40 Mg Tabs (Atorvastatin calcium) .... Take one tablet at bedtime 4)  Ramipril 10  .Marland Kitchen.. 1 once daily 5)  Carvedilol 12.5 Mg Tabs (Carvedilol) .... Take 1 tab two times a day 6)  Lantus Solostar 100 Unit/ml Soln (Insulin glargine) .Marland Kitchen.. 10 units at bedtime 7)  Bd Pen Needles 31g X 5/16  .Marland Kitchen.. 10units daily and increase by 2 nightly util fbs is  150-180 on average .  dx: 250.00 8)  Furosemide 40 Mg Tabs (Furosemide) .... Take 1 tab once daily  Patient Instructions: 1)  Check your blood sugars regularly. If your readings are usually above 150 on average prior week increase Lantus daily dose by 2 units  . Call if FBS  below 90 you should contact our office. 2)  Please schedule a follow-up appointment in 2 months. 3)  BUN,creat, K+ prior to visit, ICD-9: 4)  HbgA1C prior to visit, ICD-9:

## 2010-07-06 NOTE — Assessment & Plan Note (Signed)
Summary: rto 2 months review lab/cbs   Vital Signs:  Patient profile:   77 year old female Weight:      136.4 pounds Pulse rate:   72 / minute Resp:     17 per minute BP sitting:   130 / 78  (left arm) Cuff size:   large  Vitals Entered By: Shonna Chock CMA (December 16, 2009 10:03 AM) CC: 2 Month follow-up, discuss labs (copy given). BS Average lowest:158, Highest:25, Type 2 diabetes mellitus follow-up Comments REVIEWED MED LIST, PATIENT AGREED DOSE AND INSTRUCTION CORRECT    CC:  2 Month follow-up, discuss labs (copy given). BS Average lowest:158, Highest:25, and Type 2 diabetes mellitus follow-up.  History of Present Illness: Labs reviewed & risks discussed. A1c 9.7%(average sugar 232 & 94% increased risk(prev A1c 10%). The patient reports weight gain of 5#, but denies polyuria, polydipsia, blurred vision, self managed hypoglycemia, and numbness of extremities.  The patient denies the following symptoms: neuropathic pain, chest pain, vomiting, orthostatic symptoms, poor wound healing, intermittent claudication, vision loss, and foot ulcer.  Since the last visit the patient reports fair dietary compliance ("many weddings"), compliance with medications, not exercising regularly, and monitoring blood glucose.  The patient has been measuring capillary blood glucose before breakfast  (average 158 in May & 173 in June)and after dinner (< 256).  Since the last visit, the patient reports having had eye care by an ophthalmologist and no foot care. Hyperlipidemia Follow-Up      The patient also presents for Hyperlipidemia follow-up.  The patient denies muscle aches, GI upset, abdominal pain, flushing, itching, constipation, diarrhea, and fatigue.  The patient denies the following symptoms: chest pain/pressure, exercise intolerance, dypsnea, palpitations, syncope, and pedal edema.  Compliance with medications (by patient report) has been near 100%.  Adjunctive measures currently used by the patient  include ASA and fish oil supplements. Lipids are @ goal.                                                   Anemia slightly worse than in 08/10(HCT 34.7) but creat up to 1.5 from 1.4.Recalled for Colonoscopy .  Allergies: 1)  ! Pravachol  Review of Systems ENT:  Denies nosebleeds. Resp:  Denies coughing up blood. GI:  Denies bloody stools, dark tarry stools, and indigestion. GU:  Denies hematuria. Heme:  Denies abnormal bruising and bleeding.  Physical Exam  General:  Appears younger than age;well-nourished,in no acute distress; alert,appropriate and cooperative throughout examination Lungs:  Normal respiratory effort, chest expands symmetrically. Lungs are clear to auscultation, no crackles or wheezes. Heart:  Normal rate and regular rhythm. S1 and S2 normal without gallop, murmur, click, rub .S4 Abdomen:  Bowel sounds positive,abdomen soft and non-tender without masses, organomegaly or hernias noted. Pulses:  R and L carotid,radial,dorsalis pedis and posterior tibial pulses are full and equal bilaterally Extremities:  No clubbing, cyanosis, edema.Good nail health Neurologic:  alert & oriented X3 and sensation intact to light touch over feet.   Skin:  Intact without suspicious lesions or rashes Psych:  memory intact for recent and remote, normally interactive, and good eye contact.     Impression & Recommendations:  Problem # 1:  DIABETES MELLITUS, UNCONTROLLED (ICD-250.02)  Her updated medication list for this problem includes:    Januvia 100 Mg Tabs (Sitagliptin phosphate) .Marland Kitchen... Take 1/2  tablet  by mouth every day    Lantus Solostar 100 Unit/ml Soln (Insulin glargine) .Marland KitchenMarland KitchenMarland KitchenMarland Kitchen 10 units at bedtime    Aspirin 81 Mg Tabs (Aspirin) .Marland Kitchen... 1 by mouth once daily  Problem # 2:  HYPERTENSION (ICD-401.9) controlled Her updated medication list for this problem includes:    Carvedilol 12.5 Mg Tabs (Carvedilol) .Marland Kitchen... Take 1 tab two times a day    Furosemide 40 Mg Tabs (Furosemide) .Marland Kitchen... Take  1 tab once daily  Problem # 3:  RENAL INSUFFICIENCY (ICD-588.9)  Problem # 4:  ANEMIA-NOS (ICD-285.9) probably  Her updated medication list for this problem includes:    Folic Acid 400 Mcg Tabs (Folic acid) .Marland Kitchen... 1 by mouth once daily  Problem # 5:  HYPERLIPIDEMIA (ICD-272.4) Lipids @ goal Her updated medication list for this problem includes:    Lipitor 40 Mg Tabs (Atorvastatin calcium) .Marland Kitchen... Take one tablet at bedtime  Complete Medication List: 1)  Januvia 100 Mg Tabs (Sitagliptin phosphate) .... Take 1/2  tablet by mouth every day 2)  Freestyle Lite Strp (Glucose blood) .... Check up to three times daily as directed 3)  Lipitor 40 Mg Tabs (Atorvastatin calcium) .... Take one tablet at bedtime 4)  Ramipril 10  .Marland Kitchen.. 1 once daily 5)  Carvedilol 12.5 Mg Tabs (Carvedilol) .... Take 1 tab two times a day 6)  Lantus Solostar 100 Unit/ml Soln (Insulin glargine) .Marland Kitchen.. 10 units at bedtime 7)  Bd Pen Needles 31g X 5/16  .Marland Kitchen.. 10units daily and increase by 2 nightly util fbs is  150-180 on average .  dx: 250.00 8)  Furosemide 40 Mg Tabs (Furosemide) .... Take 1 tab once daily 9)  Osteo Bi-flex Joint Shield Tabs (Misc natural products) .Marland Kitchen.. 1 by mouth once daily 10)  Aspirin 81 Mg Tabs (Aspirin) .Marland Kitchen.. 1 by mouth once daily 11)  Vitamin D3 1000 Unit Tabs (Cholecalciferol) .Marland Kitchen.. 1 by mouth once daily 12)  Folic Acid 400 Mcg Tabs (Folic acid) .Marland Kitchen.. 1 by mouth once daily 13)  Fish Oil 1000 Mg Caps (Omega-3 fatty acids) .Marland Kitchen.. 1 by mouth once daily 14)  Calcium 600 Mg Tabs (Calcium) .Marland Kitchen.. 1 by mouth once daily  Patient Instructions: 1)  Low carb & < 30 grams of HFCS sugar /day as discussed. Increase Lantus by 2 units once daily every week until FBS approx 150.Check your blood sugars regularly. If your readings are usually above : 150 or below 90 you should contact our office. 2)  Please schedule a follow-up appointment in 4 months. 3)  BUN,creat, K+prior to visit, ICD-9:585 4)  HbgA1C prior to visit,  ICD-9:250.02 5)  CBC w/ Diff, iron panel, B12/folate prior to visit, ICD-9:285.9

## 2010-07-06 NOTE — Assessment & Plan Note (Signed)
Summary: 2 month followup///sph   Vital Signs:  Patient profile:   77 year old female Weight:      141.6 pounds BMI:     27.75 Pulse rate:   80 / minute Resp:     15 per minute BP sitting:   148 / 80  (left arm) Cuff size:   large  Vitals Entered By: Shonna Chock CMA (June 26, 2010 9:35 AM) CC: 1.) 2 month follow-up on labs (copy given)   2.) Discuss furosemide-? if patient needs to continue taking med and refill Lantus, Type 2 diabetes mellitus follow-up   CC:  1.) 2 month follow-up on labs (copy given)   2.) Discuss furosemide-? if patient needs to continue taking med and refill Lantus and Type 2 diabetes mellitus follow-up.  History of Present Illness:      This is a 77 year old woman who presents for Hypertension follow-up.  The patient reports fatigue, but denies lightheadedness, urinary frequency, headaches, and edema.  Associated symptoms include dyspnea.  The patient denies the following associated symptoms: chest pain, chest pressure, palpitations, and syncope.  Compliance with medications (by patient report) has been near 100%.  The patient reports exercising occasionally.  Adjunctive measures currently used by the patient include salt restriction.  BP not monitored regularly, but "good when I check it".  Type 2 Diabetes Mellitus Follow-Up      The patient is also here for Type 2 diabetes mellitus follow-up.  The patient reports weight gain of 8# overf the holidays, but denies polydipsia, blurred vision, self managed hypoglycemia, and numbness of extremities.  The patient denies the following symptoms: neuropathic pain, orthostatic symptoms, poor wound healing, intermittent claudication, vision loss, and foot ulcer.  Since the last visit the patient reports fair  dietary compliance.  The patient has been measuring capillary blood glucose before breakfast, average150-160  and after dinner, < 270.  Since the last visit, the patient reports having had no eye care and no foot care.     Current Medications (verified): 1)  Freestyle Lite   Strp (Glucose Blood) .... Check Up To Three Times Daily As Directed 2)  Lipitor 40 Mg  Tabs (Atorvastatin Calcium) .... Take One Tablet At Bedtime 3)  Ramipril 10 .Marland Kitchen.. 1 Once Daily 4)  Carvedilol 12.5 Mg Tabs (Carvedilol) .... Take 1 Tab Two Times A Day 5)  Lantus Solostar 100 Unit/ml Soln (Insulin Glargine) .Marland Kitchen.. 10 Units in Am, 5 Units Pm 6)  Bd Pen Needles 31g X 5/16 .Marland Kitchen.. 10units Daily and Increase By 2 Nightly Util Fbs Is  150-180 On Average .  Dx: 250.00 7)  Furosemide 40 Mg Tabs (Furosemide) .... Take 1 Tab Once Daily 8)  Osteo Bi-Flex Joint Shield  Tabs (Misc Natural Products) .Marland Kitchen.. 1 By Mouth Once Daily 9)  Aspirin 81 Mg Tabs (Aspirin) .Marland Kitchen.. 1 By Mouth Once Daily 10)  Vitamin D3 1000 Unit Tabs (Cholecalciferol) .Marland Kitchen.. 1 By Mouth Once Daily 11)  Folic Acid 400 Mcg Tabs (Folic Acid) .Marland Kitchen.. 1 By Mouth Once Daily 12)  Fish Oil 1000 Mg Caps (Omega-3 Fatty Acids) .Marland Kitchen.. 1 By Mouth Once Daily 13)  Calcium 600 Mg Tabs (Calcium) .Marland Kitchen.. 1 By Mouth Once Daily 14)  Tradjenta 5 Mg Tabs (Linagliptin) .Marland Kitchen.. 1 Once Daily With Largest Meal  Allergies: 1)  ! Pravachol  Physical Exam  General:  Appears younger than age,in no acute distress; alert,appropriate and cooperative throughout examination Lungs:  Normal respiratory effort, chest expands symmetrically. Lungs are clear to  auscultation, no crackles or wheezes. Heart:  normal rate, regular rhythm, no gallop, no rub, no JVD, and grade 1/2-1 /6 systolic murmur.   Pulses:  R and L carotid,radial,dorsalis pedis and posterior tibial pulses are full and equal bilaterally Extremities:  No clubbing, cyanosis, edema. Deformed R great toe Neurologic:  alert & oriented X3 and sensation intact to light touch.   Skin:  Intact without suspicious lesions or rashes,skin dry over feet Psych:  memory intact for recent and remote, normally interactive, and good eye contact.     Impression &  Recommendations:  Problem # 1:  DIABETES MELLITUS, UNCONTROLLED (ICD-250.02)  progressive; high post prnadial glucoses Her updated medication list for this problem includes:    Lantus Solostar 100 Unit/ml Soln (Insulin glargine) .Marland Kitchen...  24 units each am    Aspirin 81 Mg Tabs (Aspirin) .Marland Kitchen... 1 by mouth once daily    Tradjenta 5 Mg Tabs (Linagliptin) .Marland Kitchen... 1 once daily with largest meal  Orders: Endocrinology Referral (Endocrine)  Problem # 2:  HYPERTENSION (ICD-401.9)  suboptimal control Her updated medication list for this problem includes:    Furosemide 40 Mg Tabs (Furosemide) .Marland Kitchen... Take 1 tab once daily    Carvedilol 25 Mg Tabs (Carvedilol) .Marland Kitchen... 1 two times a day  Orders: Prescription Created Electronically [phone])  Problem # 3:  RENAL INSUFFICIENCY (ICD-588.9) stable to improved  Complete Medication List: 1)  Freestyle Lite Strp (Glucose blood) .... Check up to three times daily as directed 2)  Lipitor 40 Mg Tabs (Atorvastatin calcium) .... Take one tablet at bedtime 3)  Ramipril 10  .Marland Kitchen.. 1 once daily 4)  Carvedilol 25 Mg Tabs (carvedilol)  .... Take 1 tab two times a day 5)  Lantus Solostar 100 Unit/ml Soln (Insulin glargine) .... 30 units once daily 6)  Bd Pen Needles 31g X 5/16  7)  Furosemide 40 Mg Tabs (Furosemide) .... Take 1 tab once daily 8)  Osteo Bi-flex Joint Shield Tabs (Misc natural products) .Marland Kitchen.. 1 by mouth once daily 9)  Aspirin 81 Mg Tabs (Aspirin) .Marland Kitchen.. 1 by mouth once daily 10)  Vitamin D3 1000 Unit Tabs (Cholecalciferol) .Marland Kitchen.. 1 by mouth once daily 11)  Folic Acid 400 Mcg Tabs (Folic acid) .Marland Kitchen.. 1 by mouth once daily 12)  Fish Oil 1000 Mg Caps (Omega-3 fatty acids) .Marland Kitchen.. 1 by mouth once daily 13)  Calcium 600 Mg Tabs (Calcium) .Marland Kitchen.. 1 by mouth once daily 14)  Tradjenta 5 Mg Tabs (Linagliptin) .Marland Kitchen.. 1 once daily with largest meal 15)  Carvedilol 25 Mg Tabs (Carvedilol) .Marland Kitchen.. 1 two times a day  Patient Instructions: 1)  Check your Blood Pressure regularly. If  it is above: 135/85 ON AVERAGE with increased Carvedilol  you should  call.Consider Podiatry referral. 2)  Check your feet each night for sore areas, calluses or signs of infection. 3)  Check your blood sugars regularly. If your readings are usually above :  150 or below 90 fasting  you should contact our office. 4)  See your eye doctor yearly to check for diabetic eye damage. Prescriptions: LANTUS SOLOSTAR 100 UNIT/ML SOLN (INSULIN GLARGINE) 30 units once daily  #5 pens x 1   Entered and Authorized by:   Marga Melnick MD   Signed by:   Marga Melnick MD on 06/26/2010   Method used:   Faxed to ...       [facility] (retail)       [address]       [place],  [place]    [place]       Ph: [phone]       Fax: [phone]   RxID:   [id number] CARVEDILOL 25 MG TABS (CARVEDILOL) 1 two times a day  #180 x 1   Entered and Authorized by:   Marga Melnick MD   Signed by:   Marga Melnick MD on 06/26/2010   Method used:   Faxed to ...       [facility] (retail)       [address]       [place], [place]    [place]       Ph: [phone]       Fax: [phone]   RxID:   [id number] CARVEDILOL 25 MG TABS (CARVEDILOL) Take 1 tab two times a day  #180 x 1   Entered and Authorized by:   Marga Melnick MD   Signed by:   Marga Melnick MD on 06/26/2010   Method used:   Faxed to ...       [facility] (retail)       [address]       [place], [place]    [place]       Ph: [phone]       Fax: [phone]   RxID:   [id number]    Orders Added: 1)  Est. Patient Level IV [25427] 2)  Prescription Created Electronically [phone] 3)  Endocrinology Referral [Endocrine]

## 2010-07-06 NOTE — Assessment & Plan Note (Signed)
Summary: 2 month roa//lch   Vital Signs:  Patient profile:   77 year old female Weight:      131.8 pounds Pulse rate:   64 / minute Resp:     15 per minute BP sitting:   124 / 78  (left arm) Cuff size:   large  Vitals Entered By: Shonna Chock (Oct 03, 2009 9:14 AM) CC: 2 Month follow-up (discuss labs-copy given) Comments REVIEWED MED LIST, PATIENT AGREED DOSE AND INSTRUCTION CORRECT    CC:  2 Month follow-up (discuss labs-copy given).  History of Present Illness: FBS in April  averaged 182; A1c was 10% which is an average of 240 with 100% increased risk. She is on 12 units of Lantus in am. Labs reviewed & risks discussed. Creatinine has  improved to 1.4; she is drinking > 40 oz water / day.No hypoglycemia; weight stable. CVE as walking 2 mpd 3X/week w/o chest pain. She questions Carvedilol  as cause of dyspnea & paresthesias in feet.DOE  only with increased speed of walking  & with inclines. No DOE on treadmill @ 3 mph with 5% incline.  Allergies: 1)  ! Pravachol  Review of Systems CV:  Complains of shortness of breath with exertion; denies bluish discoloration of lips or nails, chest pain or discomfort, difficulty breathing at night, difficulty breathing while lying down, leg cramps with exertion, swelling of feet, and swelling of hands. Resp:  Denies cough and sputum productive. MS:  Complains of cramps; Nocturnal leg cramps. She is on 600 mg Ca++ once daily & vitamin D 1000 International Units once daily . Neuro:  Complains of numbness; denies tingling.  Physical Exam  General:  Appears yonger than age,well-nourished,in no acute distress; alert,appropriate and cooperative throughout examination Lungs:  Normal respiratory effort, chest expands symmetrically. Lungs are clear to auscultation, no crackles or wheezes. Heart:  normal rate, regular rhythm, no murmur, no gallop, no rub, no JVD, and no HJR.  Heart sounds distant Abdomen:  Bowel sounds positive,abdomen soft and  non-tender without masses, organomegaly or hernias noted. No AAA or bruits Pulses:  R and L carotid,radial,dorsalis pedis and posterior tibial pulses are full and equal bilaterally Extremities:  No clubbing, cyanosis, edema. Neurologic:  alert & oriented X3 and sensation intact to light touch over feet.   Skin:  Intact without suspicious lesions or rashes Psych:  memory intact for recent and remote, normally interactive, and good eye contact.     Impression & Recommendations:  Problem # 1:  DIABETES MELLITUS, UNCONTROLLED (ICD-250.02)  Her updated medication list for this problem includes:    Januvia 100 Mg Tabs (Sitagliptin phosphate) .Marland Kitchen... Take 1/2  tablet by mouth every day    Lantus Solostar 100 Unit/ml Soln (Insulin glargine) .Marland KitchenMarland KitchenMarland KitchenMarland Kitchen 10 units at bedtime  Problem # 2:  RENAL INSUFFICIENCY (ICD-588.9) improved  Problem # 3:  MUSCLE PAIN (ICD-729.1)  Problem # 4:  ANEMIA-NOS (ICD-285.9) PMH of  Problem # 5:  HYPERTENSION (ICD-401.9) controlled Her updated medication list for this problem includes:    Carvedilol 12.5 Mg Tabs (Carvedilol) .Marland Kitchen... Take 1 tab two times a day    Furosemide 40 Mg Tabs (Furosemide) .Marland Kitchen... Take 1 tab once daily  Problem # 6:  HYPERLIPIDEMIA (ICD-272.4)  Her updated medication list for this problem includes:    Lipitor 40 Mg Tabs (Atorvastatin calcium) .Marland Kitchen... Take one tablet at bedtime  Complete Medication List: 1)  Januvia 100 Mg Tabs (Sitagliptin phosphate) .... Take 1/2  tablet by mouth every day 2)  Freestyle Lite Strp (Glucose blood) .... Check up to three times daily as directed 3)  Lipitor 40 Mg Tabs (Atorvastatin calcium) .... Take one tablet at bedtime 4)  Ramipril 10  .Marland Kitchen.. 1 once daily 5)  Carvedilol 12.5 Mg Tabs (Carvedilol) .... Take 1 tab two times a day 6)  Lantus Solostar 100 Unit/ml Soln (Insulin glargine) .Marland Kitchen.. 10 units at bedtime 7)  Bd Pen Needles 31g X 5/16  .Marland Kitchen.. 10units daily and increase by 2 nightly util fbs is  150-180 on average .   dx: 250.00 8)  Furosemide 40 Mg Tabs (Furosemide) .... Take 1 tab once daily  Patient Instructions: 1)  Increase Lantus by 2 units every 3 days until FBS is < 160 on average . Increase calcium to twice a day. 2)  Please schedule a follow-up appointment in 2 months. 3)  BUN,creat, K+ prior to visit, ICD-9:588.9 4)  Hepatic Panel prior to visit, ICD-9:995.20 5)  Lipid Panel prior to visit, ICD-9:272.4 6)  CBC w/ Diff prior to visit, ICD-9:285.9 7)  HbgA1C prior to visit, ICD-9:250.02; vitamin D level : 268.9.

## 2010-07-06 NOTE — Letter (Signed)
Summary: Colonoscopy Letter  [facility]  [address] [place], [place] [address]   Phone: [phone]  Fax: [phone]      November 25, 2009 MRN: [id number]   Taylor Castaneda [address] [place], [place]  [address]   Dear [name],   According to your medical record, it is time for you to schedule a Colonoscopy. The American Cancer Society recommends this procedure as a method to detect early colon cancer. Patients with a family history of colon cancer, or a personal history of colon polyps or inflammatory bowel disease are at increased risk.  This letter has beeen generated based on the recommendations made at the time of your procedure. If you feel that in your particular situation this may no longer apply, please contact our office.  Please call our office at [phone] to schedule this appointment or to update your records at your earliest convenience.  Thank you for cooperating with [place] to provide you with the very best care possible.   Sincerely,  Judie Petit T. Russella Dar, M.D.  [facility] [phone]

## 2010-07-06 NOTE — Miscellaneous (Signed)
Summary: [facility] PV  Clinical Lists Changes  Medications: Added new medication of MOVIPREP 100 GM  SOLR (PEG-KCL-NACL-NASULF-NA ASC-C) As per prep instructions. - Signed Rx of MOVIPREP 100 GM  SOLR (PEG-KCL-NACL-NASULF-NA ASC-C) As per prep instructions.;  #1 x 0;  Signed;  Entered by: Ezra Sites RN;  Authorized by: Meryl Dare MD Clementeen Graham;  Method used: Electronically to [facility] [address], [place], [place]  [address], Ph: [phone], Fax: [phone] Observations: Added new observation of ALLERGY REV: Done (03/13/2010 9:36)    Prescriptions: MOVIPREP 100 GM  SOLR (PEG-KCL-NACL-NASULF-NA ASC-C) As per prep instructions.  #1 x 0   Entered by:   Ezra Sites RN   Authorized by:   Meryl Dare MD [facility]   Signed by:   Ezra Sites RN on 03/13/2010   Method used:   Electronically to        [facility] [address] (retail)       [address]       [place], [place]  [address]       Ph: [phone]       Fax: [phone]   RxID:   [id number]

## 2010-07-06 NOTE — Letter (Signed)
Summary: Moviprep Instructions  [facility]  [address]   [place], [place] [address]   Phone: [phone]  Fax: [phone]       Taylor Castaneda    1932-12-19    MRN: [id number]        Procedure Day Dorna Bloom: Monday, 03-27-10     Arrival Time: 8:00 a.m.     Procedure Time: 9:00 a.m.     Location of Procedure:                    x   [facility] (4th Floor)  PREPARATION FOR COLONOSCOPY WITH MOVIPREP   Starting 5 days prior to your procedure 03-22-10  do not eat nuts, seeds, popcorn, corn, beans, peas,  salads, or any raw vegetables.  Do not take any fiber supplements (e.g. Metamucil, Citrucel, and Benefiber).  THE DAY BEFORE YOUR PROCEDURE         DATE: 03-26-10   DAY: Sunday  1.  Drink clear liquids the entire day-NO SOLID FOOD  2.  Do not drink anything colored red or purple.  Avoid juices with pulp.  No orange juice.  3.  Drink at least 64 oz. (8 glasses) of fluid/clear liquids during the day to prevent dehydration and help the prep work efficiently.  CLEAR LIQUIDS INCLUDE: Water Jello Ice Popsicles Tea (sugar ok, no milk/cream) Powdered fruit flavored drinks Coffee (sugar ok, no milk/cream) Gatorade Juice: apple, white grape, white cranberry  Lemonade Clear bullion, consomm, broth Carbonated beverages (any kind) Strained chicken noodle soup Hard Candy                             4.  In the morning, mix first dose of MoviPrep solution:    Empty 1 Pouch A and 1 Pouch B into the disposable container    Add lukewarm drinking water to the top line of the container. Mix to dissolve    Refrigerate (mixed solution should be used within 24 hrs)  5.  Begin drinking the prep at 5:00 p.m. The MoviPrep container is divided by 4 marks.   Every 15 minutes drink the solution down to the next mark (approximately 8 oz) until the full liter is complete.   6.  Follow completed prep with 16 oz of clear liquid of your choice (Nothing red or purple).   Continue to drink clear liquids until bedtime.  7.  Before going to bed, mix second dose of MoviPrep solution:    Empty 1 Pouch A and 1 Pouch B into the disposable container    Add lukewarm drinking water to the top line of the container. Mix to dissolve    Refrigerate  THE DAY OF YOUR PROCEDURE      DATE: 03-27-10  DAY: Monday  Beginning at 4:00 a.m. (5 hours before procedure):         1. Every 15 minutes, drink the solution down to the next mark (approx 8 oz) until the full liter is complete.  2. Follow completed prep with 16 oz. of clear liquid of your choice.    3. You may drink clear liquids until  7:00 a.m.  (2 HOURS BEFORE PROCEDURE).   MEDICATION INSTRUCTIONS  Unless otherwise instructed, you should take regular prescription medications with a small sip of water   as early as possible the morning of your procedure.  Diabetic patients - see separate instructions.        OTHER  INSTRUCTIONS  You will need a responsible adult at least 75 years of age to accompany you and drive you home.   This person must remain in the waiting room during your procedure.  Wear loose fitting clothing that is easily removed.  Leave jewelry and other valuables at home.  However, you may wish to bring a book to read or  an iPod/MP3 player to listen to music as you wait for your procedure to start.  Remove all body piercing jewelry and leave at home.  Total time from sign-in until discharge is approximately 2-3 hours.  You should go home directly after your procedure and rest.  You can resume normal activities the  day after your procedure.  The day of your procedure you should not:   Drive   Make legal decisions   Operate machinery   Drink alcohol   Return to work  You will receive specific instructions about eating, activities and medications before you leave.    The above instructions have been reviewed and explained to me by   Ezra Sites RN  March 13, 2010 10:10  AM     I fully understand and can verbalize these instructions _____________________________ Date _________

## 2010-07-06 NOTE — Progress Notes (Signed)
Summary: PRE-CERT FOR CT ABD/PELVIS  Phone Note From Other Clinic   Caller: [facility] Summary of Call: PER PHONE CALL FROM [facility], PT SENT TO THEIR OFFICE FOR ABDOMINAL U/S.  A CT OF ABD & PELVIS NOW BEING ORDERED & THEY NEED ME TO PRE-CERT.  I HAVE PRE-CERTED W/PT'S INSURANCE, AND AUTHORIZATION IS [id number], GOOD THRU 07-12-2009.  I CALLED & S/W KAREN - [facility], PH [phone], SHE IS AWARE. Initial call taken by: Magdalen Spatz [facility],  June 13, 2009 3:48 PM

## 2010-07-06 NOTE — Progress Notes (Signed)
Summary: endo Referral status  Phone Note Call from Patient   Caller: Daughter--[name] Summary of Call: Patient daughter Misty Stanley left message on triage requesting the status of endo referral. She notes that maybe the patient could see Dr. Laurene Footman if he is network. Please calll her at [phone]. Initial call taken by: Lucious Groves CMA,  June 28, 2010 8:36 AM  Follow-up for Phone Call        I spoke with daughter Misty Stanley who asked that we not refer to Dr. Everardo All.  I also informed her that Dr. Dagoberto Ligas is no longer taking new patients.  Daughter Misty Stanley is aware we will refer patient to Dr. Talmage Nap if in network, call completed. Magdalen Spatz [facility]  June 28, 2010 8:51 AM

## 2010-07-06 NOTE — Assessment & Plan Note (Signed)
Summary: fu on labs/kdc   Vital Signs:  Patient profile:   77 year old female Weight:      133.4 pounds BMI:     26.15 Pulse rate:   72 / minute Resp:     16 per minute BP sitting:   164 / 98  (left arm) Cuff size:   large  Vitals Entered By: Shonna Chock (August 04, 2009 8:15 AM) CC: Follow-up visit: discuss labs, b/p and bloodsugars, Hypertension Management Comments REVIEWED MED LIST, PATIENT AGREED DOSE AND INSTRUCTION CORRECT    CC:  Follow-up visit: discuss labs, b/p and bloodsugars, and Hypertension Management.  History of Present Illness: Labs reviewed & risks discussed. Creatinine down from 1.8 to 1.3 with 40 oz water daily in spite of average BP 160/73(range 134/62-198/85). FBS 173-288; no hypoglycemia. Off Metformin & Glimiperide but still on Januvia.A1c up from 6.6 % to 9%.On decreased carb diet, no soft drinks.CVE as 3-4X/week on treadmill X 40 min @ 3.1 mph w/o C-P symptoms.  Hypertension History:      She denies headache, chest pain, palpitations, dyspnea with exertion, orthopnea, PND, peripheral edema, visual symptoms, neurologic problems, syncope, and side effects from treatment.  She notes no problems with any antihypertensive medication side effects.  DOE only with stairs, not on treadmill.  Further comments include: see BP data.        Positive major cardiovascular risk factors include female age 75 years old or older, diabetes, hyperlipidemia, hypertension, and family history for ischemic heart disease (males less than 46 years old).  Negative major cardiovascular risk factors include non-tobacco-user status.     Allergies: 1)  ! Pravachol  Review of Systems Eyes:  Denies blurring, double vision, and vision loss-both eyes; No DM retinopathy 03/2009. CV:  Denies leg cramps with exertion, lightheadness, and near fainting. Derm:  Denies poor wound healing. Neuro:  Denies numbness and tingling; No burning in feet. Endo:  Complains of excessive urination; denies  excessive hunger and excessive thirst.  Physical Exam  General:  Appears much younger than age,well-nourished,in no acute distress; alert,appropriate and cooperative throughout examination Lungs:  Normal respiratory effort, chest expands symmetrically. Lungs are clear to auscultation, no crackles or wheezes. Heart:  regular rhythm, no murmur, no gallop, no rub, no JVD, no HJR, bradycardia, S4 Abdomen:  Bowel sounds positive,abdomen soft and non-tender without masses, organomegaly or hernias noted. Pulses:  R and L carotid,radial,dorsalis pedis and posterior tibial pulses are full and equal bilaterally Extremities:  No clubbing, cyanosis, edema. Mild nail deformity Neurologic:  alert & oriented X3, sensation intact to light touch over feet, and DTRs symmetrical and normal.   Skin:  Intact without suspicious lesions or rashes Psych:  memory intact for recent and remote, normally interactive, and good eye contact.  Intelligent    Impression & Recommendations:  Problem # 1:  DIABETES MELLITUS, UNCONTROLLED (ICD-250.02)  The following medications were removed from the medication list:    Benazepril Hcl 40 Mg Tabs (Benazepril hcl) .Marland Kitchen... Take 1/2 tablet by mouth daily Her updated medication list for this problem includes:    Januvia 100 Mg Tabs (Sitagliptin phosphate) .Marland Kitchen... Take one tablet by mouth every day    Lantus Solostar 100 Unit/ml Soln (Insulin glargine) .Marland KitchenMarland KitchenMarland KitchenMarland Kitchen 10 units at bedtime  Orders: Prescription Created Electronically [phone])  Problem # 2:  HYPERTENSION (ICD-401.9)  Uncontrolled The following medications were removed from the medication list:    Benazepril Hcl 40 Mg Tabs (Benazepril hcl) .Marland Kitchen... Take 1/2 tablet by mouth daily  Metoprolol Tartrate 25 Mg Tabs (Metoprolol tartrate) .Marland Kitchen... 1 two times a day to keep bp < 140/90 Her updated medication list for this problem includes:    Carvedilol 6.25 Mg Tabs (Carvedilol) .Marland Kitchen... 1/2 two times a day  Orders: Prescription Created  Electronically [phone])  Problem # 3:  RENAL INSUFFICIENCY (ICD-588.9) Improved  Complete Medication List: 1)  Januvia 100 Mg Tabs (Sitagliptin phosphate) .... Take one tablet by mouth every day 2)  Freestyle Lite Strp (Glucose blood) .... Check up to three times daily as directed 3)  Lipitor 40 Mg Tabs (Atorvastatin calcium) .... Take one tablet at bedtime 4)  Ramipril 10  .Marland Kitchen.. 1 once daily 5)  Carvedilol 6.25 Mg Tabs (Carvedilol) .... 1/2 two times a day 6)  Lantus Solostar 100 Unit/ml Soln (Insulin glargine) .Marland Kitchen.. 10 units at bedtime 7)  Bd Pen Needles 31g X 5/16  .Marland Kitchen.. 10units daily and increase by 2 nightly util fbs is  150-180 on average .  dx: 250.00  Hypertension Assessment/Plan:      The patient's hypertensive risk group is category C: Target organ damage and/or diabetes.  Her calculated 10 year risk of coronary heart disease is 27 %.  Today's blood pressure is 164/98.    Patient Instructions: 1)  Please schedule a follow-up appointment in 2 months. 2)  BUN,creat,K+ prior to visit, ICD-9: 3)  Hepatic Panel prior to visit, ICD-9: 4)  Lipid Panel prior to visit, ICD-9: 5)  HbgA1C prior to visit, ICD-9: 6)  Urine Microalbumin prior to visit, ICD-9: Consume LESS THAN 25 grams of sugar /day from foods & drinks with High Fructose Corn Syrup as #1,2, or #3 on label. Increase Lantus by 2 units / day every week util FBS is  150-180 on average . 7)  Check your blood sugars regularly. If your readings are usually above :180  or below 90 you should contact our office. Prescriptions: BD PEN NEEDLES 31G X 5/16 10units daily and increase by 2 nightly util FBS is  150-180 on average .  DX: 250.00  #39mo supply x 3   Entered by:   Shonna Chock   Authorized by:   Marga Melnick MD   Signed by:   Shonna Chock on 08/04/2009   Method used:   Faxed to ...       [facility] [address] [phone]* (retail)       [address] [place], [place]  [address]       Ph: [phone]       Fax: [phone]    RxID:   [id number] LANTUS SOLOSTAR 100 UNIT/ML SOLN (INSULIN GLARGINE) 10 units at bedtime  #1 month x 2   Entered and Authorized by:   Marga Melnick MD   Signed by:   Marga Melnick MD on 08/04/2009   Method used:   Faxed to ...       [facility] [address] [phone]* (retail)       [address] [place], [place]  [address]       Ph: [phone]       Fax: [phone]   RxID:   [id number] CARVEDILOL 6.25 MG TABS (CARVEDILOL) 1/2 two times a day  #90 x 1   Entered and Authorized by:   Marga Melnick MD   Signed by:   Marga Melnick MD on 08/04/2009   Method used:   Faxed to ...       [facility]  [employer] [phone]* (retail)       [address], [place]  [address]       Ph: [phone]       Fax: [phone]   RxID:   [id number] RAMIPRIL 10 1 once daily  #90 x 1   Entered and Authorized by:   Marga Melnick MD   Signed by:   Marga Melnick MD on 08/04/2009   Method used:   Faxed to ...       [facility] [address] [phone]* (retail)       [address], [place]  [address]       Ph: [phone]       Fax: [phone]   RxID:   [id number]

## 2010-07-06 NOTE — Progress Notes (Signed)
Summary: CT Results  Phone Note Call from Patient Call back at [phone]   Caller: Daughter: Thalia Bloodgood Summary of Call: Messsage left on VM: Patient's daughter would like for [place] to call her house with CT Results, ? if she should continue Septra that was started at Urgent Care for UTI.   I spoke with Misty Stanley and discussed per Dr.Hopper she can stop Septra and just take pain med that was RX'ed. We will refer to urology. Copy of CT mailed, Patient will need Fasting labs BMP,Hepatic, CBCD, Uric Acid (789.09,592.0), will schedule once Urology appointment schedule, patient would like for them to be the same day./Chrae [facility]  June 14, 2009 10:17 AM

## 2010-07-06 NOTE — Letter (Signed)
Summary: Results Follow up Letter  [name] [facility]  [address] [place], [place] [address]   Phone: [phone]  Fax: [phone]    06/14/2009 MRN: [id number]  Taylor Castaneda [address] [place], [place]  [address]  Dear [name],  The following are the results of your recent test(s):  Test         Result    Pap Smear:        Normal _____  Not Normal _____ Comments: ______________________________________________________ Cholesterol: LDL(Bad cholesterol):         Your goal is less than:         HDL (Good cholesterol):       Your goal is more than: Comments:  ______________________________________________________ Mammogram:        Normal _____  Not Normal _____ Comments:  ___________________________________________________________________ Hemoccult:        Normal _____  Not normal _______ Comments:    _____________________________________________________________________ Other Tests: Please see attached CT done on 06/13/2009    We routinely do not discuss normal results over the telephone.  If you desire a copy of the results, or you have any questions about this information we can discuss them at your next office visit.   Sincerely,

## 2010-07-06 NOTE — Letter (Signed)
Summary: Results Follow up Letter  [place] [facility]  [address] [place], [place] [address]   Phone: [phone]  Fax: [phone]    06/10/2009 MRN: [id number]  Taylor Castaneda [address] [place], [place]  [address]  Dear Ms. Badia,  The following are the results of your recent test(s):  Test         Result    Pap Smear:        Normal _____  Not Normal _____ Comments: ______________________________________________________ Cholesterol: LDL(Bad cholesterol):         Your goal is less than:         HDL (Good cholesterol):       Your goal is more than: Comments:  ______________________________________________________ Mammogram:        Normal _____  Not Normal _____ Comments:  ___________________________________________________________________ Hemoccult:        Normal _____  Not normal _______ Comments:    _____________________________________________________________________ Other Tests:Please see attached labs.    We routinely do not discuss normal results over the telephone.  If you desire a copy of the results, or you have any questions about this information we can discuss them at your next office visit.   Sincerely,    Felecia,CMA

## 2010-07-06 NOTE — Procedures (Signed)
Summary: Colonoscopy  Patient: Taylor Castaneda Note: All result statuses are Final unless otherwise noted.  Tests: (1) Colonoscopy (COL)   COL Colonoscopy           DONE     [facility]     [address]     [place], [place]  [address]           COLONOSCOPY PROCEDURE REPORT           PATIENT:  [name], [name]  MR#:  [id number]     BIRTHDATE:  [birth year], 77 yrs. old  GENDER:  female     ENDOSCOPIST:  Judie Petit T. Russella Dar, MD, [facility]           PROCEDURE DATE:  03/27/2010     PROCEDURE:  Colonoscopy [address]     ASA CLASS:  Class II     INDICATIONS:  1) Elevated Risk Screening  2) history  of colon     cancer  3) history of pre-cancerous (adenomatous) colon polyps     ;T1, N0, 1998 and polyps in 01/2007.     MEDICATIONS:   Fentanyl 50 mcg IV, Versed 6 mg IV     DESCRIPTION OF PROCEDURE:   After the risks benefits and     alternatives of the procedure were thoroughly explained, informed     consent was obtained.  Digital rectal exam was performed and     revealed no abnormalities.   The LB [id number] endoscope     was introduced through the anus and advanced to the anastomosis,     without limitations.  The quality of the prep was good, using     MoviPrep.  The instrument was then slowly withdrawn as the colon     was fully examined.     <<PROCEDUREIMAGES>>     FINDINGS:  The right colon was surgically resected and an     ileo-colonic anastamosis was seen in the mid transverse colon.     The transverse, splenic flexure, descending, sigmoid colon, and     rectum appeared unremarkable. Retroflexed views in the rectum     revealed internal hemorrhoids, small.  The time to cecum =  2.25     minutes. The scope was then withdrawn (time =  6  min) from the     patient and the procedure completed.           COMPLICATIONS:  None           ENDOSCOPIC IMPRESSION:     1) Prior right hemi-colectomy     2) Anastomosis in the mid transverse colon     3) Internal hemorrhoids        RECOMMENDATIONS:     1) Repeat Colonoscopy in 3 years.           Venita Lick. Russella Dar, MD, [facility]           n.     eSIGNED:   Venita Lick. Stark at 03/27/2010 09:22 AM           Hessie Dibble, [id number]  Note: An exclamation mark (!) indicates a result that was not dispersed into the flowsheet. Document Creation Date: 03/27/2010 9:22 AM _______________________________________________________________________  (1) Order result status: Final Collection or observation date-time: 03/27/2010 09:17 Requested date-time:  Receipt date-time:  Reported date-time:  Referring Physician:   Ordering Physician: Claudette Head [phone]) Specimen Source:  Source: Launa Grill Order Number: [id number] Lab site:   Appended Document: Colonoscopy    Clinical Lists  Changes  Observations: Added new observation of COLONNXTDUE: 03/2013 (03/27/2010 13:33)

## 2010-07-06 NOTE — Letter (Signed)
Summary: Previsit letter  [facility]  [address] [place], [place] [address]   Phone: [phone]  Fax: [phone]       01/04/2010 MRN: [id number]  Taylor Castaneda [address] [place], [place]  [address]  Dear [name],  Welcome to the Gastroenterology Division at [facility].    You are scheduled to see a nurse for your pre-procedure visit on 03-13-10 at 10:00a.m. on the 3rd floor at [facility], [address]  We ask that you try to arrive at our office 15 minutes prior to your appointment time to allow for check-in.  Your nurse visit will consist of discussing your medical and surgical history, your immediate family medical history, and your medications.    Please bring a complete list of all your medications or, if you prefer, bring the medication bottles and we will list them.  We will need to be aware of both prescribed and over the counter drugs.  We will need to know exact dosage information as well.  If you are on blood thinners (Coumadin, Plavix, Aggrenox, Ticlid, etc.) please call our office today/prior to your appointment, as we need to consult with your physician about holding your medication.   Please be prepared to read and sign documents such as consent forms, a financial agreement, and acknowledgement forms.  If necessary, and with your consent, a friend or relative is welcome to sit-in on the nurse visit with you.  Please bring your insurance card so that we may make a copy of it.  If your insurance requires a referral to see a specialist, please bring your referral form from your primary care physician.  No co-pay is required for this nurse visit.     If you cannot keep your appointment, please call [phone] to cancel or reschedule prior to your appointment date.  This allows [place] the opportunity to schedule an appointment for another patient in need of care.    Thank you for choosing [facility] for your medical  needs.  We appreciate the opportunity to care for you.  Please visit [place] at our website  to learn more about our practice.                     Sincerely.                                                                                                                   The Gastroenterology Division

## 2010-07-06 NOTE — Assessment & Plan Note (Signed)
Summary: *2 lines-8am flu shot+ lab=bun-creat-k+:585-hbga1c:250.02-cbc...  Nurse Visit   Allergies: 1)  ! Pravachol  Orders Added: 1)  Venipuncture [36415] 2)  Specimen Handling [99000] 3)  TLB-Creatinine, Blood [82565-CREA] 4)  TLB-BUN (Urea Nitrogen) [84520-BUN] 5)  TLB-Potassium (K+) [84132-K] 6)  TLB-CBC Platelet - w/Differential [85025-CBCD] 7)  TLB-B12 + Folate Pnl [82746_82607-B12/FOL] 8)  TLB-A1C / Hgb A1C (Glycohemoglobin) [83036-A1C] 9)  TLB-Iron, (Fe) Total [83540-FE] 10)  Flu Vaccine 40yrs + MEDICARE PATIENTS [Q2039] 11)  Administration Flu vaccine - MCR [G0008]            Flu Vaccine Consent Questions     Do you have a history of severe allergic reactions to this vaccine? no    Any prior history of allergic reactions to egg and/or gelatin? no    Do you have a sensitivity to the preservative Thimersol? no    Do you have a past history of Guillan-Barre Syndrome? no    Do you currently have an acute febrile illness? no    Have you ever had a severe reaction to latex? no    Vaccine information given and explained to patient? yes    Are you currently pregnant? no    Lot Number:[id number]   Exp Date:12/02/2010   Site Given  Left Deltoid IM

## 2010-07-06 NOTE — Assessment & Plan Note (Signed)
Summary: kidney infection/kdc   Vital Signs:  Patient profile:   77 year old female Weight:      134.2 pounds Temp:     98.9 degrees F oral Pulse rate:   80 / minute Resp:     17 per minute BP sitting:   142 / 78  (left arm) Cuff size:   large  Vitals Entered By: Shonna Chock (June 08, 2009 3:04 PM) CC: Nauseated and left lower back pain since 8pm yesterday. Patient vomitted x 1 Comments REVIEWED MED LIST, PATIENT AGREED DOSE AND INSTRUCTION CORRECT    CC:  Nauseated and left lower back pain since 8pm yesterday. Patient vomitted x 1.  History of Present Illness: Intermittent  L flank pain up to 7-8  since 06/05/2009; constant since last night with N&V X1.Sleep affected. Rx: OTC Advil w/o benefit. No PMH recurrent UTI or calculi. DM : FBS averages 80; 39(!!!) this am.  Anticoagulation Management History:      Positive risk factors for bleeding include an age of 66 years or older and presence of serious comorbidities.  The bleeding index is 'intermediate risk'.  Positive CHADS2 values include History of Diabetes.  Negative CHADS2 values include Age > 61 years old.     Allergies: 1)  ! Pravachol  Review of Systems General:  Complains of chills; denies fever and sweats. GI:  Denies abdominal pain, bloody stools, and dark tarry stools. GU:  Complains of hematuria; denies discharge and dysuria. Derm:  Denies lesion(s) and rash.  Physical Exam  General:  Appears younger than age,in no acute distress; alert,appropriate and cooperative throughout examination Abdomen:  Bowel sounds positive,abdomen soft and non-tender without masses, organomegaly or hernias noted. Msk:  No deformity or scoliosis noted of thoracic or lumbar spine.   Sat up w/o help. Pain to percussion L flank  Extremities:  Neg SLR bilaterally Neurologic:  strength normal in all extremities and DTRs symmetrical and normal.   Skin:  Intact without suspicious lesions or rashes Psych:  memory intact for recent and  remote, normally interactive, and good eye contact.     Impression & Recommendations:  Problem # 1:  UTI (ICD-599.0)  Her updated medication list for this problem includes:    Ciprofloxacin Hcl 500 Mg Tabs (Ciprofloxacin hcl) .Marland Kitchen... 1 two times a day  Problem # 2:  DIABETES MELLITUS, CONTROLLED (ICD-250.00)  Hypoglycemia  Her updated medication list for this problem includes:    Januvia 100 Mg Tabs (Sitagliptin phosphate) .Marland Kitchen... Take one tablet by mouth every day    Benazepril Hcl 40 Mg Tabs (Benazepril hcl) .Marland Kitchen... Take 1/2 tablet by mouth daily    Metformin Hcl 1000 Mg Tabs (Metformin hcl) .Marland Kitchen... 1 by mouth bid  Orders: Venipuncture ([address]) TLB-Creatinine, Blood (82565-CREA) TLB-Potassium (K+) (84132-K) TLB-BUN (Urea Nitrogen) (84520-BUN) TLB-A1C / Hgb A1C (Glycohemoglobin) (83036-A1C)  Complete Medication List: 1)  Januvia 100 Mg Tabs (Sitagliptin phosphate) .... Take one tablet by mouth every day 2)  Benazepril Hcl 40 Mg Tabs (Benazepril hcl) .... Take 1/2 tablet by mouth daily 3)  Freestyle Lite Strp (Glucose blood) .... Check up to three times daily as directed 4)  Metformin Hcl 1000 Mg Tabs (Metformin hcl) .Marland Kitchen.. 1 by mouth bid 5)  Lipitor 40 Mg Tabs (Atorvastatin calcium) .... Take one tablet at bedtime 6)  Ciprofloxacin Hcl 500 Mg Tabs (Ciprofloxacin hcl) .Marland Kitchen.. 1 two times a day  Other Orders: T-Culture, Urine (98119-14782) UA Dipstick w/o Micro (manual) (95621)  Patient Instructions: 1)  Drink as  much fluid as you can tolerate for the next few days. Stop Glimiperide Prescriptions: CIPROFLOXACIN HCL 500 MG TABS (CIPROFLOXACIN HCL) 1 two times a day  #20 x 0   Entered and Authorized by:   Marga Melnick MD   Signed by:   Marga Melnick MD on 06/08/2009   Method used:   Faxed to ...       [facility] [address]* (retail)       [address]       [place], [place]  [address]       Ph: [phone]       Fax: [phone]   RxID:   [id number]   Laboratory  Results   Urine Tests    Routine Urinalysis   Color: straw Appearance: Cloudy Glucose: negative   (Normal Range: Negative) Bilirubin: negative   (Normal Range: Negative) Ketone: negative   (Normal Range: Negative) Spec. Gravity: 1.015   (Normal Range: 1.003-1.035) Blood: large   (Normal Range: Negative) pH: 6.0   (Normal Range: 5.0-8.0) Protein: negative   (Normal Range: Negative) Urobilinogen: 0.2   (Normal Range: 0-1) Nitrite: negative   (Normal Range: Negative) Leukocyte Esterace: large   (Normal Range: Negative)

## 2010-07-06 NOTE — Progress Notes (Signed)
Summary: lab results  Phone Note Outgoing Call   Call placed by: [facility] CMA,  June 10, 2009 10:08 AM Summary of Call: left message to call  office..............................Marland KitchenFelecia Deloach CMA  June 10, 2009 10:08 AM   Creatinine (kidney  function) was 1.3 in 01/2009. Stop Metformin. Stay well hydrated , drinking up to 40 oz of water daily. Recheck BUN, creat, K+, A1c in 7 weeks. Consume LESS THAN 25 grams of sugar / day from LABELED foods & drinks.  Follow-up for Phone Call        pt aware, letter mailed ...............Marland KitchenFelecia Deloach CMA  June 10, 2009 11:14 AM

## 2010-07-06 NOTE — Letter (Signed)
Summary: Diabetic Instructions  [facility]  [address]   [place], [place] [address]   Phone: [phone]  Fax: [phone]    Taylor Castaneda May 21, 1933 MRN: [id number]    x    ORAL DIABETIC MEDICATION INSTRUCTIONS  The day before your procedure:   Take your diabetic pill as you do normally  The day of your procedure:   Do not take your diabetic pill    We will check your blood sugar levels during the admission process and again in Recovery before discharging you home  ________________________________________________________________________  _  _   INSULIN (LONG ACTING) MEDICATION INSTRUCTIONS (Lantus, NPH, 70/30, Humulin, Novolin-N)   The day before your procedure:   Take  your regular evening dose    The day of your procedure:   Do not take your morning dose

## 2010-07-06 NOTE — Progress Notes (Signed)
Summary: reaction to med  Phone Note Call from Patient Call back at [facility] Phone [phone] Call back at [phone]   Caller: Patient Summary of Call: Pt left VM that since start ramipril 10mg , carvedilol 6.25mg  and lantus she has had swelling and numbness in both leg from knee down. pt denies any SOB, dizziness, chest pain  or pressure, tenderness, or warmness to touch. pt BP on average 170/80,  BS average is 200..............Marland KitchenFelecia Deloach CMA  August 10, 2009 8:22 AM   Follow-up for Phone Call        increase Carvedilol to 12.5 two times a day 6.25 mg two times a day ; add Furosemide 40 mg once daily #30; BUN,creat, K+ in 1 week with OV next day. Call for increasing SOB, swelling Follow-up by: Marga Melnick MD,  August 10, 2009 8:48 AM  Additional Follow-up for Phone Call Additional follow up Details #1::        pt aware, ov and labs scheduled, rx sent to pharmacy...............Marland KitchenFelecia Deloach CMA  August 10, 2009 9:25 AM     New/Updated Medications: CARVEDILOL 12.5 MG TABS (CARVEDILOL) Take 1 tab two times a day FUROSEMIDE 40 MG TABS (FUROSEMIDE) take 1 tab once daily Prescriptions: CARVEDILOL 12.5 MG TABS (CARVEDILOL) Take 1 tab two times a day  #90 x 0   Entered by:   Jeremy Johann CMA   Authorized by:   Marga Melnick MD   Signed by:   Jeremy Johann CMA on 08/10/2009   Method used:   Faxed to ...       [facility] [address] [phone]* (retail)       [address] [place], [place]  [address]       Ph: [phone]       Fax: [phone]   RxID:   [id number] FUROSEMIDE 40 MG TABS (FUROSEMIDE) take 1 tab once daily  #30 x 0   Entered by:   Jeremy Johann CMA   Authorized by:   Marga Melnick MD   Signed by:   Jeremy Johann CMA on 08/10/2009   Method used:   Faxed to ...       [facility] [address] [phone]* (retail)       [address] [place]  [address]       Ph: [phone]       Fax: [phone]   RxID:   [id number]

## 2010-07-06 NOTE — Letter (Signed)
Summary: [facility]   Imported By: Lanelle Bal 11/24/2009 12:44:34  _____________________________________________________________________  External Attachment:    Type:   Image     Comment:   External Document

## 2010-07-06 NOTE — Assessment & Plan Note (Signed)
Summary: F/U KIDNEY INF/RH......   Vital Signs:  Patient profile:   77 year old female Height:      60 inches Weight:      132 pounds Temp:     98.3 degrees F oral Pulse rate:   78 / minute Resp:     17 per minute BP sitting:   160 / 62  (left arm)  Vitals Entered By: Jeremy Johann CMA (June 13, 2009 12:20 PM) CC: f/u kidney infection, lower back pain, nausea Comments REVIEWED MED LIST, PATIENT AGREED DOSE AND INSTRUCTION CORRECT    CC:  f/u kidney infection, lower back pain, and nausea.  History of Present Illness: She was seen @ [facility] 06/12/2009 afternoon  in [place] for L flank pain  & nausea ; Dr Larina Bras   diagnosed pyelonephritis & gave her Septra DS & IM Rocephin.? AAA questioned by Dr Larina Bras; no Xrays done; UA & blood tests done. Tramadol increased to 1 every 3 hrs with ES Tylenol  with improvement. Labs here reviewed : creat 1.8 from 1.3 in 01/2009; A1c 6.6%. Metformin D/Ced as of last Weds (01/05).   Allergies: 1)  ! Pravachol  Review of Systems General:  Complains of chills; denies fever and sweats. GU:  Denies discharge, dysuria, and hematuria; "trace blood in urine @ UC".  Physical Exam  General:  in no acute distress; alert,appropriate and cooperative throughout examination Lungs:  Normal respiratory effort, chest expands symmetrically. Lungs are clear to auscultation, no crackles or wheezes. Heart:  normal rate, regular rhythm, no gallop, no rub, no JVD, no HJR, and grade 1 /6 systolic murmur.   Abdomen:  Bowel sounds positive,abdomen soft and non-tender without  organomegaly or hernias noted. Ill defined pulsatile mass L periumbilical area Msk:  Slight L flank tenderness Pulses:  R and L carotid,radial  and posterior tibial pulses are full and equal bilaterally. Decreased PTP Extremities:  No clubbing, cyanosis, edema. Neurologic:  alert & oriented X3.   Skin:  Intact without suspicious lesions or rashes Cervical Nodes:  No lymphadenopathy noted Axillary  Nodes:  No palpable lymphadenopathy Psych:  memory intact for recent and remote, normally interactive, and good eye contact.     Impression & Recommendations:  Problem # 1:  ABDOMINAL MASS (ICD-789.30)  L periumbilical area; R/O dissecting AAA compromising renal function & causing L flank pain : [place] of abdomen  Orders: Radiology Referral (Radiology)  Problem # 2:  RENAL INSUFFICIENCY (ICD-588.9)  Orders:Stop Metformin; avoid IV contrast if possible Radiology Referral (Radiology)  Problem # 3:  DIABETES MELLITUS, CONTROLLED (ICD-250.00)  The following medications were removed from the medication list:    Metformin Hcl 1000 Mg Tabs (Metformin hcl) .Marland Kitchen... 1 by mouth bid Her updated medication list for this problem includes:    Januvia 100 Mg Tabs (Sitagliptin phosphate) .Marland Kitchen... Take one tablet by mouth every day    Benazepril Hcl 40 Mg Tabs (Benazepril hcl) .Marland Kitchen... Take 1/2 tablet by mouth daily  Problem # 4:  HYPERTENSION (ICD-401.9)  Her updated medication list for this problem includes:    Benazepril Hcl 40 Mg Tabs (Benazepril hcl) .Marland Kitchen... Take 1/2 tablet by mouth daily    Metoprolol Tartrate 25 Mg Tabs (Metoprolol tartrate) .Marland Kitchen... 1 two times a day to keep bp < 140/90  Complete Medication List: 1)  Januvia 100 Mg Tabs (Sitagliptin phosphate) .... Take one tablet by mouth every day 2)  Benazepril Hcl 40 Mg Tabs (Benazepril hcl) .... Take 1/2 tablet by mouth daily 3)  Freestyle  Lite Strp (Glucose blood) .... Check up to three times daily as directed 4)  Lipitor 40 Mg Tabs (Atorvastatin calcium) .... Take one tablet at bedtime 5)  Tramadol Hcl 50 Mg Tabs (Tramadol hcl) .Marland Kitchen.. 1 q 6 hrs as needed pain 6)  Sulfamethoxazole-tmp Ds 800-160 Mg Tabs (Sulfamethoxazole-trimethoprim) .... Take 1 tab two times a day 7)  Hydrocodone-acetaminophen 5-500 Mg Tabs (Hydrocodone-acetaminophen) .Marland Kitchen.. 1 q 4 hrs as needed pain 8)  Metoprolol Tartrate 25 Mg Tabs (Metoprolol tartrate) .Marland Kitchen.. 1 two times a day to  keep bp < 140/90  Patient Instructions: 1)  Take typed  record to Xray & to all MDs seen. Prescriptions: METOPROLOL TARTRATE 25 MG TABS (METOPROLOL TARTRATE) 1 two times a day to keep BP < 140/90  #60 x 5   Entered and Authorized by:   Marga Melnick MD   Signed by:   Marga Melnick MD on 06/13/2009   Method used:   Print then Give to Patient   RxID:   [id number] HYDROCODONE-ACETAMINOPHEN 5-500 MG TABS (HYDROCODONE-ACETAMINOPHEN) 1 q 4 hrs as needed pain  #30 x 0   Entered and Authorized by:   Marga Melnick MD   Signed by:   Marga Melnick MD on 06/13/2009   Method used:   Print then Give to Patient   RxID:   [id number]

## 2010-07-06 NOTE — Progress Notes (Signed)
Summary: Refill Request  Phone Note Refill Request Message from:  Patient on November 23, 2009 8:55 AM  Refills Requested: Medication #1:  JANUVIA 100 MG  TABS Take 1/2  tablet by mouth every day   Dosage confirmed as above?Dosage Confirmed   Supply Requested: 1 month [facility] [address]  Next Appointment Scheduled: July 12th 2011 Initial call taken by: Lavell Islam,  November 23, 2009 8:55 AM    Prescriptions: JANUVIA 100 MG  TABS (SITAGLIPTIN PHOSPHATE) Take 1/2  tablet by mouth every day  #15 x 2   Entered by:   Shonna Chock   Authorized by:   Marga Melnick MD   Signed by:   Shonna Chock on 11/23/2009   Method used:   Electronically to        [facility] [address] [phone]* (retail)       [address] [place]  [address]       Ph: [phone]       Fax: [phone]   RxID:   [id number]

## 2010-07-07 NOTE — Medication Information (Signed)
[facility]   Imported By: Lanelle Bal 03/10/2010 12:19:57  _____________________________________________________________________  External Attachment:    Type:   Image     Comment:   External Document

## 2010-07-13 ENCOUNTER — Ambulatory Visit: Payer: Self-pay | Admitting: Endocrinology

## 2010-07-17 ENCOUNTER — Other Ambulatory Visit: Payer: Self-pay | Admitting: Obstetrics & Gynecology

## 2010-07-20 NOTE — Consult Note (Signed)
[facility]   Imported By: Maryln Gottron 07/10/2010 15:12:45  _____________________________________________________________________  External Attachment:    Type:   Image     Comment:   External Document

## 2010-07-28 ENCOUNTER — Telehealth (INDEPENDENT_AMBULATORY_CARE_PROVIDER_SITE_OTHER): Payer: Self-pay | Admitting: *Deleted

## 2010-08-01 NOTE — Progress Notes (Signed)
Summary: refill  Phone Note Refill Request Message from:  Fax from Pharmacy on July 28, 2010 11:35 AM  Refills Requested: Medication #1:  FREESTYLE LITE   STRP check up to three times daily as directed [facility] [address] - fax [phone]  Initial call taken by: Okey Regal Spring,  July 28, 2010 11:37 AM    Prescriptions: FREESTYLE LITE   STRP (GLUCOSE BLOOD) check up to three times daily as directed  #100 Each x 2   Entered by:   Shonna Chock CMA   Authorized by:   Marga Melnick MD   Signed by:   Shonna Chock CMA on 07/28/2010   Method used:   Electronically to        [facility] [address] (retail)       [address]       [place], [place]  [address]       Ph: [phone]       Fax: [phone]   RxID:   [id number]

## 2010-08-16 LAB — GLUCOSE, CAPILLARY

## 2010-08-20 LAB — GLUCOSE, CAPILLARY: Glucose-Capillary: 124 mg/dL — ABNORMAL HIGH (ref 70–99)

## 2010-08-26 ENCOUNTER — Other Ambulatory Visit: Payer: Self-pay | Admitting: Internal Medicine

## 2010-09-27 ENCOUNTER — Other Ambulatory Visit: Payer: Self-pay | Admitting: Internal Medicine

## 2010-10-03 ENCOUNTER — Encounter: Payer: Self-pay | Admitting: Internal Medicine

## 2010-10-05 ENCOUNTER — Ambulatory Visit (INDEPENDENT_AMBULATORY_CARE_PROVIDER_SITE_OTHER): Payer: Medicare Other | Admitting: Internal Medicine

## 2010-10-05 ENCOUNTER — Ambulatory Visit (INDEPENDENT_AMBULATORY_CARE_PROVIDER_SITE_OTHER)
Admission: RE | Admit: 2010-10-05 | Discharge: 2010-10-05 | Disposition: A | Discharge status: Home / Self Care | Payer: Medicare Other | Source: Ambulatory Visit | Attending: Internal Medicine | Admitting: Internal Medicine

## 2010-10-05 ENCOUNTER — Encounter: Payer: Self-pay | Admitting: Internal Medicine

## 2010-10-05 VITALS — BP 110/66 | HR 67 | Temp 98.3°F | Wt 143.0 lb

## 2010-10-05 DIAGNOSIS — R0609 Other forms of dyspnea: Secondary | ICD-10-CM

## 2010-10-05 DIAGNOSIS — R0600 Dyspnea, unspecified: Secondary | ICD-10-CM

## 2010-10-05 DIAGNOSIS — D649 Anemia, unspecified: Secondary | ICD-10-CM

## 2010-10-05 DIAGNOSIS — M949 Disorder of cartilage, unspecified: Secondary | ICD-10-CM

## 2010-10-05 DIAGNOSIS — M899 Disorder of bone, unspecified: Secondary | ICD-10-CM

## 2010-10-05 DIAGNOSIS — M8580 Other specified disorders of bone density and structure, unspecified site: Secondary | ICD-10-CM

## 2010-10-05 DIAGNOSIS — IMO0001 Reserved for inherently not codable concepts without codable children: Secondary | ICD-10-CM

## 2010-10-05 DIAGNOSIS — R0989 Other specified symptoms and signs involving the circulatory and respiratory systems: Secondary | ICD-10-CM

## 2010-10-05 LAB — CBC WITH DIFFERENTIAL/PLATELET
Basophils Relative: 0.6 % (ref 0.0–3.0)
Eosinophils Absolute: 0.2 10*3/uL (ref 0.0–0.7)
MCHC: 33.1 g/dL (ref 30.0–36.0)
MCV: 83 fl (ref 78.0–100.0)
Monocytes Absolute: 0.7 10*3/uL (ref 0.1–1.0)
Neutrophils Relative %: 61.5 % (ref 43.0–77.0)
RBC: 4.02 Mil/uL (ref 3.87–5.11)

## 2010-10-05 LAB — POTASSIUM: Potassium: 3.9 mEq/L (ref 3.5–5.1)

## 2010-10-05 NOTE — Patient Instructions (Addendum)
Complete stool cards. Please have a chest x-ray completed to assess the shortness of breath. Your EKG did not show any signs of ischemia to suggest that the shortness of breath is an anginal variant.

## 2010-10-05 NOTE — Assessment & Plan Note (Signed)
Dr Talmage Nap; A1c 9.7% in 06/2010

## 2010-10-05 NOTE — Progress Notes (Signed)
  Subjective:    Patient ID: Taylor Castaneda, female    DOB: 09/23/1932, 77 y.o.   MRN: [id number]  HPI She presents with exertional dyspnea. It's been present for several months but worse recently. Initially it was with walking a significant distance; now occurs after walking 100 feet.  She denies any chest pain ,palpitations or pleuritic chest pain. She denies any cough or sputum production. She's had no hemoptysis.  She does have some shortness of breath at times when she turns over in bed but no true PND.  She has a past history of anemia which she believes was caused by pravastatin. This was according to Dr. Dan Humphreys in [facility].  At this time she denies abdominal pain, rectal bleeding, or black or tarry stools. She also has no significant dyspepsia.  She does have muscle cramps and pain in the calves and thighs, particularly at night. She has history of osteopenia.  Colonoscopy has been negative in 2011.    Review of Systems      Objective:   Physical Exam on exam she is in no acute distress;  no increased work of breathing is present. O2 sats are 97% on room air.  Her skin is warm and dry without tenting or jaundice.  There is some pallor of conjunctivae.  Her chest is clear without rales, rhonchi, or wheezing  She has a regular rhythm without significant murmur or gallop.  She has no lymphadenopathy in the head, neck or axilla.  There is no neck vein distention or hepatojugular reflux. She is no abdominal tenderness or masses.  No edema is present. She has no clubbing or cyanosis. There is slight discomfort to compression of the right calf.  All pulses are intact but the posterior tibial pulses are decreased.        Assessment & Plan:  #1 exertional dyspnea  #2 conjunctival pallor in the context of past medical history of anemia  #3 diabetes uncontrolled; the A1c has improved slightly.  #4 muscle pain  #5 history of osteopenia Plan: #1 EKG  will be done to rule out an anginal variant in the context of uncontrolled diabetes  #2 chest x-ray will be ordered  #3 CBC, vitamin D, Mg, Calcium, K+, CPK  will be checked.

## 2010-10-13 ENCOUNTER — Other Ambulatory Visit: Payer: Self-pay | Admitting: *Deleted

## 2010-10-13 DIAGNOSIS — D649 Anemia, unspecified: Secondary | ICD-10-CM

## 2010-10-16 ENCOUNTER — Other Ambulatory Visit (INDEPENDENT_AMBULATORY_CARE_PROVIDER_SITE_OTHER): Payer: Medicare Other

## 2010-10-16 DIAGNOSIS — D649 Anemia, unspecified: Secondary | ICD-10-CM

## 2010-10-16 DIAGNOSIS — Z79899 Other long term (current) drug therapy: Secondary | ICD-10-CM

## 2010-10-16 LAB — FOLATE: Folate: >24.8 ng/mL (ref >5.9)

## 2010-10-16 LAB — VITAMIN B12: Vitamin B-12: 193 pg/mL — ABNORMAL LOW (ref 211–911)

## 2010-10-23 ENCOUNTER — Other Ambulatory Visit (INDEPENDENT_AMBULATORY_CARE_PROVIDER_SITE_OTHER): Payer: Medicare Other

## 2010-10-23 ENCOUNTER — Telehealth: Payer: Self-pay

## 2010-10-23 DIAGNOSIS — Z1211 Encounter for screening for malignant neoplasm of colon: Secondary | ICD-10-CM

## 2010-10-23 LAB — HEMOCCULT GUIAC POC 1CARD (OFFICE)
Card #2 Fecal Occult Blod, POC: NEGATIVE
Fecal Occult Blood, POC: NEGATIVE

## 2010-10-23 NOTE — Telephone Encounter (Signed)
Message copied by Stephan Minister on Mon Oct 23, 2010  2:52 PM ------      Message from: Marga Melnick      Created: Mon Oct 23, 2010  8:12 AM       Needs B12 shots      ----- Message -----         From: SYSTEM         Sent: 10/21/2010  12:00 AM           To: Pecola Lawless, MD

## 2010-10-23 NOTE — Telephone Encounter (Signed)
Reason for call: patient needs to schedule appointment for B12 injections

## 2010-10-23 NOTE — Progress Notes (Signed)
B12 deficiency is present; 1000 mcg injection weekly x4 and then monthly thereafter is recommended. After 6 months; B12 level should be rechecked along with a CBC and differential. (281.1) Hopp

## 2010-10-24 NOTE — Telephone Encounter (Signed)
Patient called back and left message on Triage VM stating she was returning call  I called patient back and left message on her Voice mail with Dr.Hopper's recommendation for b12 injection. Patient instructed to call and schedule nurse visit for B12 injection once weekly x 1 month then once monthly

## 2010-10-31 ENCOUNTER — Ambulatory Visit (INDEPENDENT_AMBULATORY_CARE_PROVIDER_SITE_OTHER): Payer: Medicare Other | Admitting: *Deleted

## 2010-10-31 DIAGNOSIS — E538 Deficiency of other specified B group vitamins: Secondary | ICD-10-CM

## 2010-10-31 MED ORDER — CYANOCOBALAMIN 1000 MCG/ML IJ SOLN
1000.0000 ug | Freq: Once | INTRAMUSCULAR | Status: AC
  Administered 2010-10-31: 1000 ug via INTRAMUSCULAR

## 2010-11-01 ENCOUNTER — Other Ambulatory Visit: Payer: Self-pay | Admitting: Internal Medicine

## 2010-11-03 ENCOUNTER — Encounter (HOSPITAL_BASED_OUTPATIENT_CLINIC_OR_DEPARTMENT_OTHER)
Admission: RE | Admit: 2010-11-03 | Discharge: 2010-11-03 | Disposition: A | Discharge status: Home / Self Care | Payer: Medicare Other | Source: Ambulatory Visit | Attending: Orthopedic Surgery | Admitting: Orthopedic Surgery

## 2010-11-03 ENCOUNTER — Telehealth: Payer: Self-pay | Admitting: Internal Medicine

## 2010-11-03 LAB — BASIC METABOLIC PANEL
BUN: 36 mg/dL — ABNORMAL HIGH (ref 6–23)
CO2: 25 mEq/L (ref 19–32)
Chloride: 104 mEq/L (ref 96–112)
Creatinine, Ser: 1.7 mg/dL — ABNORMAL HIGH (ref 0.4–1.2)
Glucose, Bld: 147 mg/dL — ABNORMAL HIGH (ref 70–99)

## 2010-11-03 NOTE — Telephone Encounter (Signed)
Ok to get b12 shots at [facility]

## 2010-11-07 ENCOUNTER — Ambulatory Visit (HOSPITAL_BASED_OUTPATIENT_CLINIC_OR_DEPARTMENT_OTHER)
Admission: RE | Admit: 2010-11-07 | Discharge: 2010-11-07 | Disposition: A | Discharge status: Home / Self Care | Payer: Medicare Other | Source: Ambulatory Visit | Attending: Orthopedic Surgery | Admitting: Orthopedic Surgery

## 2010-11-07 DIAGNOSIS — I10 Essential (primary) hypertension: Secondary | ICD-10-CM | POA: Insufficient documentation

## 2010-11-07 DIAGNOSIS — Z01812 Encounter for preprocedural laboratory examination: Secondary | ICD-10-CM | POA: Insufficient documentation

## 2010-11-07 DIAGNOSIS — E119 Type 2 diabetes mellitus without complications: Secondary | ICD-10-CM | POA: Insufficient documentation

## 2010-11-07 DIAGNOSIS — M65849 Other synovitis and tenosynovitis, unspecified hand: Secondary | ICD-10-CM | POA: Insufficient documentation

## 2010-11-07 DIAGNOSIS — M65839 Other synovitis and tenosynovitis, unspecified forearm: Secondary | ICD-10-CM | POA: Insufficient documentation

## 2010-11-07 DIAGNOSIS — Z01810 Encounter for preprocedural cardiovascular examination: Secondary | ICD-10-CM | POA: Insufficient documentation

## 2010-11-07 LAB — GLUCOSE, CAPILLARY: Glucose-Capillary: 207 mg/dL — ABNORMAL HIGH (ref 70–99)

## 2010-11-08 ENCOUNTER — Ambulatory Visit: Payer: Medicare Other

## 2010-11-08 ENCOUNTER — Ambulatory Visit (INDEPENDENT_AMBULATORY_CARE_PROVIDER_SITE_OTHER): Payer: Medicare Other | Admitting: Internal Medicine

## 2010-11-08 DIAGNOSIS — E538 Deficiency of other specified B group vitamins: Secondary | ICD-10-CM

## 2010-11-08 MED ORDER — CYANOCOBALAMIN 1000 MCG/ML IJ SOLN
1000.0000 ug | Freq: Once | INTRAMUSCULAR | Status: AC
  Administered 2010-11-08: 1000 ug via INTRAMUSCULAR

## 2010-11-08 NOTE — Progress Notes (Signed)
B-12 given IM right deltoid. 

## 2010-11-15 ENCOUNTER — Ambulatory Visit (INDEPENDENT_AMBULATORY_CARE_PROVIDER_SITE_OTHER): Payer: Medicare Other | Admitting: Family Medicine

## 2010-11-15 DIAGNOSIS — D649 Anemia, unspecified: Secondary | ICD-10-CM

## 2010-11-15 MED ORDER — CYANOCOBALAMIN 1000 MCG/ML IJ SOLN
1000.0000 ug | Freq: Once | INTRAMUSCULAR | Status: AC
  Administered 2010-11-15: 1000 ug via INTRAMUSCULAR

## 2010-11-16 NOTE — Progress Notes (Signed)
  Subjective:    Patient ID: Taylor Castaneda, female    DOB: [birth year], 77 y.o.   MRN: [id number]  HPI  Here for inj only  Review of Systems     Objective:   Physical Exam        Assessment & Plan:

## 2010-11-22 ENCOUNTER — Ambulatory Visit (INDEPENDENT_AMBULATORY_CARE_PROVIDER_SITE_OTHER): Payer: Medicare Other | Admitting: Family Medicine

## 2010-11-22 ENCOUNTER — Other Ambulatory Visit: Payer: Self-pay | Admitting: Internal Medicine

## 2010-11-22 DIAGNOSIS — D649 Anemia, unspecified: Secondary | ICD-10-CM

## 2010-11-22 MED ORDER — CYANOCOBALAMIN 1000 MCG/ML IJ SOLN
1000.0000 ug | Freq: Once | INTRAMUSCULAR | Status: AC
  Administered 2010-11-22: 1000 ug via INTRAMUSCULAR

## 2010-11-23 NOTE — Progress Notes (Signed)
  Subjective:    Patient ID: Taylor Castaneda, female    DOB: 08/27/1932, 78 y.o.   MRN: [id number]  HPI  Here for inj only  Review of Systems     Objective:   Physical Exam        Assessment & Plan:   

## 2010-11-24 ENCOUNTER — Telehealth: Payer: Self-pay | Admitting: Internal Medicine

## 2010-11-24 NOTE — Telephone Encounter (Signed)
Pt aware to recheck in 1 month then will be rechecked in 4 months.

## 2010-11-24 NOTE — Telephone Encounter (Signed)
Patient had 4th b-12 - is she to have inj  Once a month or should she have level checked before getting another b-12 inj

## 2010-12-04 ENCOUNTER — Other Ambulatory Visit: Payer: Self-pay | Admitting: Internal Medicine

## 2010-12-18 ENCOUNTER — Other Ambulatory Visit: Payer: Self-pay | Admitting: Internal Medicine

## 2010-12-18 NOTE — Telephone Encounter (Signed)
Patient needs to schedule CPX/Fasting labs

## 2010-12-22 NOTE — Op Note (Signed)
NAMEMarland Kitchen  JANAUTICA, NETZLEY NO.:  [id number]  MEDICAL RECORD NO.:  [id number]  LOCATION:                                 FACILITY:  PHYSICIAN:  Cindee Salt, M.D.            DATE OF BIRTH:  DATE OF PROCEDURE: DATE OF DISCHARGE:                              OPERATIVE REPORT   PREOPERATIVE DIAGNOSIS:  Stenosing tenosynovitis, right ring finger.  POSTOPERATIVE DIAGNOSIS:  Stenosing tenosynovitis, right ring finger.  OPERATION:  Release A1 pulley, debridement, flexor digitorum profundus, partial rupture.  SURGEON:  Cindee Salt, MD  ASSISTANT:  Betha Loa, MD  ANESTHESIA:  Forearm based IV regional with local infiltration.  DATE OF OPERATION:  November 07, 2010.  ANESTHESIOLOGIST:  Sheldon Silvan, MD  HISTORY:  The patient is a 77 year old female with a history of triggering of her right ring finger unresponsive to conservative treatment.  She recalls no history of injury.  She has elected to undergo surgical release.  Her postoperative course have been discussed along with risks and complications.  She is aware there is no guarantee with surgery, possibility of infection, recurrence of injury to arteries, nerves, tendons incomplete relief of symptoms, dystrophy. Preoperative area, the patient is seen.  The extremity marked by both the patient and surgeon.  Antibiotic given.  PROCEDURE:  The patient was brought to the operating room where forearm- based IV regional anesthetic was carried out without difficulty.  She was prepped using ChloraPrep, supine position, right arm free.  A 3- minute dry time was allowed.  Time-out taken confirming the patient procedure.  An oblique incision was made over the A1 pulley of the right ring finger, carried down through subcutaneous tissue.  Bleeders were electrocauterized with bipolar.  Retractor was placed.  The A1 pulley was identified.  A flexor sheath cyst was ruptured. The A1 pulley was then released in its radial aspect.   Small incision made in the central aspect of A2.  With flexion/extension of the finger, a portion of tendon was immediately apparent.  This was quite deep in the wound.  The wound was then extended.  A partial rupture of the profundus tendon was noted. Tenosynovectomy was performed.  The extent of the rupture was unable to be determined through the incision.  This was extended proximally in a zigzag manner.  The tenosynovectomy was completed.  A 20% rupture of the profundus tendon was noted.  This was entirely debrided.  No exostoses were palpable in the course of the incision.  No further triggering was noted.  The wound was irrigated.  Skin closed with interrupted 5-0 Vicryl Rapide sutures.  A local infiltration with 0.25% Marcaine without epinephrine was given, approximately 5 mL was used.  Sterile compressive dressing was applied.  Deflation of the tourniquet, all fingers immediately pinked.  She was taken to the recovery room for observation in satisfactory condition.  She will be discharged home to return to [facility] in 1 week on Vicodin.          ______________________________ Cindee Salt, M.D.     GK/MEDQ  D:  11/07/2010  T:  11/07/2010  Job:  [id number]  cc:   Titus Dubin. Alwyn Ren, MD,FACP,FCCP  Electronically Signed by Cindee Salt M.D. on 12/22/2010 09:16:17 AM

## 2010-12-25 ENCOUNTER — Ambulatory Visit (INDEPENDENT_AMBULATORY_CARE_PROVIDER_SITE_OTHER): Payer: Medicare Other | Admitting: *Deleted

## 2010-12-25 DIAGNOSIS — E538 Deficiency of other specified B group vitamins: Secondary | ICD-10-CM

## 2010-12-25 MED ORDER — CYANOCOBALAMIN 1000 MCG/ML IJ SOLN
1000.0000 ug | Freq: Once | INTRAMUSCULAR | Status: AC
  Administered 2010-12-25: 1000 ug via INTRAMUSCULAR

## 2011-01-01 ENCOUNTER — Other Ambulatory Visit: Payer: Self-pay | Admitting: Internal Medicine

## 2011-01-25 ENCOUNTER — Ambulatory Visit (INDEPENDENT_AMBULATORY_CARE_PROVIDER_SITE_OTHER): Payer: Medicare Other | Admitting: *Deleted

## 2011-01-25 DIAGNOSIS — E538 Deficiency of other specified B group vitamins: Secondary | ICD-10-CM

## 2011-01-25 MED ORDER — CYANOCOBALAMIN 1000 MCG/ML IJ SOLN
1000.0000 ug | Freq: Once | INTRAMUSCULAR | Status: AC
  Administered 2011-01-25: 1000 ug via INTRAMUSCULAR

## 2011-02-09 ENCOUNTER — Other Ambulatory Visit: Payer: Self-pay | Admitting: Internal Medicine

## 2011-02-09 MED ORDER — GLUCOSE BLOOD VI STRP: ORAL_STRIP | Status: DC

## 2011-02-09 NOTE — Telephone Encounter (Signed)
RX sent to pharmacy  

## 2011-02-23 ENCOUNTER — Ambulatory Visit (INDEPENDENT_AMBULATORY_CARE_PROVIDER_SITE_OTHER)
Admission: RE | Admit: 2011-02-23 | Discharge: 2011-02-23 | Disposition: A | Discharge status: Home / Self Care | Payer: Medicare Other | Source: Ambulatory Visit | Attending: Internal Medicine | Admitting: Internal Medicine

## 2011-02-23 ENCOUNTER — Ambulatory Visit (INDEPENDENT_AMBULATORY_CARE_PROVIDER_SITE_OTHER): Payer: Medicare Other | Admitting: Internal Medicine

## 2011-02-23 ENCOUNTER — Encounter: Payer: Self-pay | Admitting: Internal Medicine

## 2011-02-23 ENCOUNTER — Telehealth: Payer: Self-pay | Admitting: *Deleted

## 2011-02-23 ENCOUNTER — Ambulatory Visit (INDEPENDENT_AMBULATORY_CARE_PROVIDER_SITE_OTHER): Payer: Medicare Other | Admitting: *Deleted

## 2011-02-23 DIAGNOSIS — E785 Hyperlipidemia, unspecified: Secondary | ICD-10-CM

## 2011-02-23 DIAGNOSIS — R0600 Dyspnea, unspecified: Secondary | ICD-10-CM

## 2011-02-23 DIAGNOSIS — N259 Disorder resulting from impaired renal tubular function, unspecified: Secondary | ICD-10-CM

## 2011-02-23 DIAGNOSIS — I10 Essential (primary) hypertension: Secondary | ICD-10-CM

## 2011-02-23 DIAGNOSIS — R0989 Other specified symptoms and signs involving the circulatory and respiratory systems: Secondary | ICD-10-CM

## 2011-02-23 DIAGNOSIS — E538 Deficiency of other specified B group vitamins: Secondary | ICD-10-CM

## 2011-02-23 DIAGNOSIS — R0609 Other forms of dyspnea: Secondary | ICD-10-CM

## 2011-02-23 DIAGNOSIS — IMO0001 Reserved for inherently not codable concepts without codable children: Secondary | ICD-10-CM

## 2011-02-23 DIAGNOSIS — M25512 Pain in left shoulder: Secondary | ICD-10-CM

## 2011-02-23 DIAGNOSIS — D510 Vitamin B12 deficiency anemia due to intrinsic factor deficiency: Secondary | ICD-10-CM

## 2011-02-23 DIAGNOSIS — M25519 Pain in unspecified shoulder: Secondary | ICD-10-CM

## 2011-02-23 DIAGNOSIS — M25511 Pain in right shoulder: Secondary | ICD-10-CM

## 2011-02-23 LAB — LIPID PANEL
LDL Cholesterol: 48 mg/dL (ref 0–99)
Total CHOL/HDL Ratio: 3

## 2011-02-23 LAB — BASIC METABOLIC PANEL
CO2: 27 mEq/L (ref 19–32)
Calcium: 9.1 mg/dL (ref 8.4–10.5)
Chloride: 105 mEq/L (ref 96–112)
Glucose, Bld: 131 mg/dL — ABNORMAL HIGH (ref 70–99)
Potassium: 3.3 mEq/L — ABNORMAL LOW (ref 3.5–5.1)
Sodium: 143 mEq/L (ref 135–145)

## 2011-02-23 LAB — CBC WITH DIFFERENTIAL/PLATELET
Basophils Absolute: 0.1 10*3/uL (ref 0.0–0.1)
Eosinophils Relative: 4.2 % (ref 0.0–5.0)
HCT: 32.7 % — ABNORMAL LOW (ref 36.0–46.0)
Hemoglobin: 10.6 g/dL — ABNORMAL LOW (ref 12.0–15.0)
Lymphocytes Relative: 18 % (ref 12.0–46.0)
Lymphs Abs: 1.8 10*3/uL (ref 0.7–4.0)
Monocytes Relative: 5.9 % (ref 3.0–12.0)
Neutro Abs: 6.9 10*3/uL (ref 1.4–7.7)
RBC: 3.75 Mil/uL — ABNORMAL LOW (ref 3.87–5.11)
RDW: 20 % — ABNORMAL HIGH (ref 11.5–14.6)
WBC: 9.7 10*3/uL (ref 4.5–10.5)

## 2011-02-23 LAB — HEPATIC FUNCTION PANEL
ALT: 15 U/L (ref 0–35)
AST: 25 U/L (ref 0–37)
Albumin: 3.7 g/dL (ref 3.5–5.2)
Alkaline Phosphatase: 95 U/L (ref 39–117)
Bilirubin, Direct: 0.1 mg/dL (ref 0.0–0.3)
Total Protein: 7.2 g/dL (ref 6.0–8.3)

## 2011-02-23 LAB — SEDIMENTATION RATE: Sed Rate: 46 mm/hr — ABNORMAL HIGH (ref 0–22)

## 2011-02-23 MED ORDER — CYANOCOBALAMIN 1000 MCG/ML IJ SOLN
1000.0000 ug | Freq: Once | INTRAMUSCULAR | Status: AC
Start: 2011-02-23 — End: 2011-02-23
  Administered 2011-02-23: 1000 ug via INTRAMUSCULAR

## 2011-02-23 NOTE — Progress Notes (Signed)
Subjective:    Patient ID: Taylor Castaneda, female    DOB: 1932/11/23, 77 y.o.   MRN: [id number]  HPI #1 SHOULDER  PAIN: Location: R> L   Onset: 2 mos ago Trgger/Injury: no   Severity: up to 7 Pain is described as: dull, aching  Worse with: supine    Better with: Aleve 2 twice a day  Pain radiates to: to elbow   Impaired range of motion: yes History of repetitive motion:  no  History of overuse or hyperextension:  no  History of trauma:  no   Past history of similar problem:  no  Symptoms Numbness/tingling:  no  Weakness:  no  Red Flags Fever:  no  Rash: no Bowel/bladder dysfunction:  no  There is no personal or family history of significant rheumatologic issues.  #2 DYSPNEA: Onset:9 mos ago Character: DOE & with waist flexion; no PNDyspnea Cough:no Sputum production:no Hemoptysis:no Wheezing:@ night Chest pain, edema, palpitations:edema X 3 -4 weeks Treatment/efficacy:none Use of rescue inhaler or  maintenance inhaler:no Smoking:quit in 1973, smoked X 3 years  As 1 pack every 2 weeks Past medical history: Asthma, Allergies, emphysema:no Family history pulmonary disease: no     Review of Systems  Diabetes is well controlled with an average fasting blood sugar 123 with increased doses of Lantus and Humalog 3 times a day by Dr. Talmage Nap     Objective:   Physical Exam Gen.: Healthy and well-nourished in appearance. Alert, appropriate and cooperative throughout exam. Head: Normocephalic without obvious abnormalities Eyes: No corneal or conjunctival inflammation noted. Extraocular motion intact.  Mouth: Oral mucosa and oropharynx reveal no lesions or exudates. Dentures Neck: No deformities, masses, or tenderness noted. Range of motion & . Thyroid normal. There is no neck vein distention at 15 Lungs: Normal respiratory effort; chest expands symmetrically. Lungs are clear to auscultation without rales, wheezes, or increased work of breathing. Heart: Normal rate and  rhythm. Normal S1 and S2. No gallop, click, or rub. S4 w/o  murmur. Heart sounds are somewhat distant  due to  breast tissue. Abdomen: Bowel sounds normal; abdomen soft and nontender. No masses, organomegaly or hernias noted. She has no hepatojugular reflux                                                                                    Musculoskeletal/extremities: Lordosis noted of  the thoracic  ; asymmetry of the thoracic muscles with the right greater than the left suggesting possible subclinical scoliosis. No clubbing, cyanosis, or deformity noted. Range of motion decreased in the upper extremities. No significant crepitus noted at the shoulders .Tone & strength  Normal.Joints essentially normal; minimal DIP osteoarthritic changes. Nail health  good. Tissues over the ankles are tight with trace pitting edema. Vascular: Carotid, radial artery, dorsalis pedis and  posterior tibial pulses are full and equal. No bruits present. Neurologic: Alert and oriented x3. Deep tendon reflexes symmetrical and normal.          Skin: Intact without suspicious lesions or rashes. Lymph: No cervical, axillary  lymphadenopathy present. Psych: Mood and affect are normal. Normally interactive  Assessment & Plan:  #1 bilateral shoulder pain, rule out polymyalgia rheumatica. Problematic is her use of nonsteroidals with increased cardiac and gastric risk  #2 dyspnea, mainly dyspnea on exertion. Rule out cardiac etiology as well as pulmonary components. Waist flexion causing shortness of breath suggests hiatal hernia.  #3 diabetes, well controlled based on fasting blood sugars ;A1c was 8.2%  8/30  Plan: See orders and recommendations.   EKG reveals no ischemic changes. She has an incomplete right bundle branch block. Voltage is low as expected due to breast tissue. This also is the cause of the distant heart sounds noted  above.

## 2011-02-23 NOTE — Telephone Encounter (Signed)
Discuss with patient, xray order put in

## 2011-02-23 NOTE — Patient Instructions (Addendum)
Your BP goal = AVERAGE < 135/85. Avoid ingestion of  excess salt/sodium.Cook with pepper & other spices . Use the salt substitute "No Salt"(unless your potassium has been elevated) OR the Mrs Sharilyn Sites products to season food @ the table. Avoid foods which taste salty or "vinegary" as their sodium contentet will be high. Weigh weekly & keep a diary. Order for x-rays entered into  the computer; these will be performed at [address] [facility]. across from [facility]. No appointment is necessary.

## 2011-02-23 NOTE — Telephone Encounter (Signed)
Message copied by Verdene Rio on Fri Feb 23, 2011 12:43 PM ------      Message from: Pecola Lawless      Created: Fri Feb 23, 2011 12:17 PM       Chest x-ray suggests mild fluid overload, that is  slight heart failure. Please increase the Lasix to twice a day. Recheck the chest x-ray in one week. Restrict salt as discussed.

## 2011-03-01 ENCOUNTER — Other Ambulatory Visit: Payer: Self-pay | Admitting: Internal Medicine

## 2011-03-01 MED ORDER — FUROSEMIDE 40 MG PO TABS: 40.0000 mg | ORAL_TABLET | Freq: Every day | ORAL | Status: DC

## 2011-03-01 NOTE — Telephone Encounter (Signed)
RX sent

## 2011-03-02 ENCOUNTER — Telehealth: Payer: Self-pay

## 2011-03-02 ENCOUNTER — Encounter: Payer: Self-pay | Admitting: Internal Medicine

## 2011-03-02 ENCOUNTER — Ambulatory Visit: Payer: Medicare Other | Admitting: Internal Medicine

## 2011-03-02 ENCOUNTER — Ambulatory Visit (INDEPENDENT_AMBULATORY_CARE_PROVIDER_SITE_OTHER)
Admission: RE | Admit: 2011-03-02 | Discharge: 2011-03-02 | Disposition: A | Discharge status: Home / Self Care | Payer: Medicare Other | Source: Ambulatory Visit | Attending: Internal Medicine | Admitting: Internal Medicine

## 2011-03-02 DIAGNOSIS — R0609 Other forms of dyspnea: Secondary | ICD-10-CM

## 2011-03-02 DIAGNOSIS — R0989 Other specified symptoms and signs involving the circulatory and respiratory systems: Secondary | ICD-10-CM

## 2011-03-02 DIAGNOSIS — R0600 Dyspnea, unspecified: Secondary | ICD-10-CM

## 2011-03-02 NOTE — Telephone Encounter (Signed)
Message copied by Edgardo Roys on Fri Mar 02, 2011  4:22 PM ------      Message from: Pecola Lawless      Created: Fri Mar 02, 2011  4:06 PM       Good; the chest Xray reveals no active cardiac or pulmonary process. Decrease Lasix back to once daily. [employer]

## 2011-03-02 NOTE — Telephone Encounter (Signed)
Spoke with patient, patient informed about Chest Xray Results, patient ok'd all instruction. Copy of report to be mailed

## 2011-03-05 ENCOUNTER — Encounter: Payer: Self-pay | Admitting: Internal Medicine

## 2011-03-05 ENCOUNTER — Ambulatory Visit (INDEPENDENT_AMBULATORY_CARE_PROVIDER_SITE_OTHER): Payer: Medicare Other | Admitting: Internal Medicine

## 2011-03-05 DIAGNOSIS — E876 Hypokalemia: Secondary | ICD-10-CM

## 2011-03-05 DIAGNOSIS — IMO0001 Reserved for inherently not codable concepts without codable children: Secondary | ICD-10-CM

## 2011-03-05 DIAGNOSIS — M353 Polymyalgia rheumatica: Secondary | ICD-10-CM

## 2011-03-05 DIAGNOSIS — N259 Disorder resulting from impaired renal tubular function, unspecified: Secondary | ICD-10-CM

## 2011-03-05 DIAGNOSIS — J811 Chronic pulmonary edema: Secondary | ICD-10-CM

## 2011-03-05 DIAGNOSIS — D649 Anemia, unspecified: Secondary | ICD-10-CM

## 2011-03-05 DIAGNOSIS — Z79899 Other long term (current) drug therapy: Secondary | ICD-10-CM

## 2011-03-05 LAB — CBC WITH DIFFERENTIAL/PLATELET
Basophils Relative: 0.2 % (ref 0.0–3.0)
Eosinophils Absolute: 0.1 10*3/uL (ref 0.0–0.7)
Eosinophils Relative: 0.5 % (ref 0.0–5.0)
HCT: 39.6 % (ref 36.0–46.0)
Lymphs Abs: 4.4 10*3/uL — ABNORMAL HIGH (ref 0.7–4.0)
MCHC: 32.5 g/dL (ref 30.0–36.0)
MCV: 87.4 fl (ref 78.0–100.0)
Monocytes Absolute: 1.5 10*3/uL — ABNORMAL HIGH (ref 0.1–1.0)
Neutro Abs: 12.9 10*3/uL — ABNORMAL HIGH (ref 1.4–7.7)
Neutrophils Relative %: 68.2 % (ref 43.0–77.0)
RBC: 4.53 Mil/uL (ref 3.87–5.11)
WBC: 19 10*3/uL (ref 4.5–10.5)

## 2011-03-05 LAB — BASIC METABOLIC PANEL
CO2: 20 mEq/L (ref 19–32)
Chloride: 102 mEq/L (ref 96–112)
Creatinine, Ser: 2 mg/dL — ABNORMAL HIGH (ref 0.4–1.2)
Potassium: 4.8 mEq/L (ref 3.5–5.1)

## 2011-03-05 LAB — SEDIMENTATION RATE: Sed Rate: 20 mm/hr (ref 0–22)

## 2011-03-05 NOTE — Patient Instructions (Signed)
Your BP goal = AVERAGE < 135/85. Avoid ingestion of  excess salt/sodium.Cook with pepper & other spices . Use the salt substitute "No Salt"(unless your potassium has been elevated) OR the Mrs Sharilyn Sites products to season food @ the table. Avoid foods which taste salty or "vinegary" as their sodium contentet will be high.  Weigh  each Monday; report any weight gain greater than 5 pounds.

## 2011-03-05 NOTE — Progress Notes (Signed)
  Subjective:    Patient ID: Taylor Castaneda, female    DOB: 11-21-1932, 77 y.o.   MRN: [id number]  HPI She is clinically significantly improved  with the furosemide 40 mg twice a day and the prednisone 10 mg twice a day. She has lost about 15 pounds with diuresis. Chest x-ray 9/28 compared to 9/21 showed decreased  pulmonary edema. The shoulder girdle symptoms have improved significantly with the prednisone. Significant abnormalities on 9/21 included a potassium of 3.3, creatinine 1.6, CK 214, hematocrit 32.7 and sedimentation rate of 46.    Review of Systems     Objective:   Physical Exam She is in no acute distress; she appears healthy and well-nourished.  Chest is clear without increased work of breathing, rhonchi, rales or wheezes.  She has a slow S4; heart sounds are distant  She has no neck vein distention at 15. There is no hepatojugular reflux.  She has no pedal edema. Posterior tibial pulses are slightly decreased. There are no ischemic changes the feet.  She has markedly increased range of motion of the neck and upper extremities. Acutely she exhibited decreased mental sensorium. Her daughter realizes quickly. Clinically there was improvement with ingestion of 8.4 ounces of fruit during (20 g of sugar). Her mental status improved dramatically with this. Note: she took Lantus & Humalog this am & had not eaten.        Assessment & Plan:  #1 volume overload with mild pulmonary edema; clinical improvement following increased diuresis and sodium restriction #2 probable polymyalgia rheumatica #3 hypokalemia  #4 renal insufficiency  #5 anemia, probably secondary to #4  #5 diabetes, uncontrolled, labile  #7 acute hypoglycemic episode, responsive to fruit drink and a small piece of candy  Plan: #1 she obviously she has uncontrolled diabetes but marked response to the Humalog as noted. We'll work closely with her Endocrinologist.  #2 referral will be made to a  Rheumatologist for optimal management of presumed polymyalgia rheumatica. Problematic we'll be treating the PMR without exacerbating her diabetes  #3 see labs and orders.

## 2011-03-06 ENCOUNTER — Telehealth: Payer: Self-pay | Admitting: *Deleted

## 2011-03-06 NOTE — Telephone Encounter (Signed)
Discuss with patient  

## 2011-03-06 NOTE — Telephone Encounter (Signed)
Pt is scheduled to fly out to [place] on Thursday and is to return on Monday. Pt daughter wants to know if it is ok to travel and if not why.  Pt seen on yesterday. Please advise

## 2011-03-06 NOTE — Telephone Encounter (Signed)
Okay to fly to [place]; decrease potassium to 20 mEq once a day. Decrease Lasix to once a day as well. Repeat the kidney function test (BMET)  in 3 weeks. The potassium dose may need to be decreased further.

## 2011-03-07 ENCOUNTER — Telehealth: Payer: Self-pay | Admitting: *Deleted

## 2011-03-07 NOTE — Telephone Encounter (Signed)
Pt c/o decrease engery, wanting to sleep all the time and loss of appetite. Pt seen on 03-05-11. Pt also notes that she has since cancel her trip to [place]. Please advise

## 2011-03-07 NOTE — Telephone Encounter (Signed)
Recheck BMET at Monday 10/8 to follow renal function

## 2011-03-07 NOTE — Telephone Encounter (Signed)
Discuss with patient, appt scheduled. Pt also advise to watch for any black/tarry stool or blood in stool. Pt ok info.

## 2011-03-08 ENCOUNTER — Inpatient Hospital Stay (HOSPITAL_COMMUNITY)
Admission: EM | Admit: 2011-03-08 | Discharge: 2011-03-10 | DRG: 683 | Disposition: A | Discharge status: Home / Self Care | Payer: Medicare Other | Attending: Internal Medicine | Admitting: Internal Medicine

## 2011-03-08 ENCOUNTER — Emergency Department (HOSPITAL_COMMUNITY): Payer: Medicare Other

## 2011-03-08 DIAGNOSIS — N189 Chronic kidney disease, unspecified: Secondary | ICD-10-CM | POA: Diagnosis present

## 2011-03-08 DIAGNOSIS — N179 Acute kidney failure, unspecified: Principal | ICD-10-CM | POA: Diagnosis present

## 2011-03-08 DIAGNOSIS — Z794 Long term (current) use of insulin: Secondary | ICD-10-CM

## 2011-03-08 DIAGNOSIS — IMO0002 Reserved for concepts with insufficient information to code with codable children: Secondary | ICD-10-CM

## 2011-03-08 DIAGNOSIS — I129 Hypertensive chronic kidney disease with stage 1 through stage 4 chronic kidney disease, or unspecified chronic kidney disease: Secondary | ICD-10-CM | POA: Diagnosis present

## 2011-03-08 DIAGNOSIS — D72829 Elevated white blood cell count, unspecified: Secondary | ICD-10-CM | POA: Diagnosis present

## 2011-03-08 DIAGNOSIS — E119 Type 2 diabetes mellitus without complications: Secondary | ICD-10-CM | POA: Diagnosis present

## 2011-03-08 DIAGNOSIS — N390 Urinary tract infection, site not specified: Secondary | ICD-10-CM | POA: Diagnosis present

## 2011-03-08 DIAGNOSIS — E875 Hyperkalemia: Secondary | ICD-10-CM | POA: Diagnosis present

## 2011-03-08 DIAGNOSIS — E860 Dehydration: Secondary | ICD-10-CM | POA: Diagnosis present

## 2011-03-08 DIAGNOSIS — E785 Hyperlipidemia, unspecified: Secondary | ICD-10-CM | POA: Diagnosis present

## 2011-03-08 DIAGNOSIS — M353 Polymyalgia rheumatica: Secondary | ICD-10-CM | POA: Diagnosis present

## 2011-03-08 DIAGNOSIS — Z7982 Long term (current) use of aspirin: Secondary | ICD-10-CM

## 2011-03-08 LAB — URINALYSIS, ROUTINE W REFLEX MICROSCOPIC
Bilirubin Urine: NEGATIVE
Hgb urine dipstick: NEGATIVE
Protein, ur: NEGATIVE mg/dL
Specific Gravity, Urine: 1.016 (ref 1.005–1.030)
Urobilinogen, UA: 0.2 mg/dL (ref 0.0–1.0)
pH: 5 (ref 5.0–8.0)

## 2011-03-08 LAB — COMPREHENSIVE METABOLIC PANEL
AST: 16 U/L (ref 0–37)
Alkaline Phosphatase: 104 U/L (ref 39–117)
CO2: 20 mEq/L (ref 19–32)
Chloride: 99 mEq/L (ref 96–112)
Creatinine, Ser: 2.18 mg/dL — ABNORMAL HIGH (ref 0.50–1.10)
GFR calc non Af Amer: 21 mL/min — ABNORMAL LOW (ref >90)
Potassium: 5.4 mEq/L — ABNORMAL HIGH (ref 3.5–5.1)
Total Bilirubin: 0.5 mg/dL (ref 0.3–1.2)

## 2011-03-08 LAB — CBC
HCT: 41.6 % (ref 36.0–46.0)
MCV: 83.7 fL (ref 78.0–100.0)
Platelets: 301 10*3/uL (ref 150–400)
RBC: 4.97 MIL/uL (ref 3.87–5.11)
WBC: 20.9 10*3/uL — ABNORMAL HIGH (ref 4.0–10.5)

## 2011-03-08 LAB — DIFFERENTIAL
Basophils Absolute: 0 10*3/uL (ref 0.0–0.1)
Eosinophils Absolute: 0.1 10*3/uL (ref 0.0–0.7)
Lymphocytes Relative: 19 % (ref 12–46)
Lymphs Abs: 3.9 10*3/uL (ref 0.7–4.0)
Neutrophils Relative %: 71 % (ref 43–77)

## 2011-03-08 LAB — URINE MICROSCOPIC-ADD ON

## 2011-03-09 LAB — CBC
HCT: 38.1 % (ref 36.0–46.0)
Hemoglobin: 12.5 g/dL (ref 12.0–15.0)
MCH: 27.6 pg (ref 26.0–34.0)
MCHC: 32.8 g/dL (ref 30.0–36.0)
MCV: 84.1 fL (ref 78.0–100.0)
RBC: 4.53 MIL/uL (ref 3.87–5.11)

## 2011-03-09 LAB — COMPREHENSIVE METABOLIC PANEL
BUN: 91 mg/dL — ABNORMAL HIGH (ref 6–23)
CO2: 19 mEq/L (ref 19–32)
Calcium: 9.2 mg/dL (ref 8.4–10.5)
Creatinine, Ser: 1.97 mg/dL — ABNORMAL HIGH (ref 0.50–1.10)
GFR calc Af Amer: 27 mL/min — ABNORMAL LOW (ref >90)
GFR calc non Af Amer: 23 mL/min — ABNORMAL LOW (ref >90)
Glucose, Bld: 91 mg/dL (ref 70–99)
Total Protein: 6.6 g/dL (ref 6.0–8.3)

## 2011-03-09 LAB — TSH: TSH: 2.243 u[IU]/mL (ref 0.350–4.500)

## 2011-03-09 LAB — GLUCOSE, CAPILLARY: Glucose-Capillary: 142 mg/dL — ABNORMAL HIGH (ref 70–99)

## 2011-03-09 LAB — POCT I-STAT TROPONIN I

## 2011-03-09 LAB — MAGNESIUM: Magnesium: 2.5 mg/dL (ref 1.5–2.5)

## 2011-03-10 LAB — BASIC METABOLIC PANEL
BUN: 80 mg/dL — ABNORMAL HIGH (ref 6–23)
CO2: 19 mEq/L (ref 19–32)
Calcium: 10.4 mg/dL (ref 8.4–10.5)
Chloride: 102 mEq/L (ref 96–112)
Creatinine, Ser: 1.63 mg/dL — ABNORMAL HIGH (ref 0.50–1.10)
GFR calc Af Amer: 34 mL/min — ABNORMAL LOW (ref >90)

## 2011-03-10 LAB — CBC
HCT: 39.6 % (ref 36.0–46.0)
MCH: 28.4 pg (ref 26.0–34.0)
MCV: 84.6 fL (ref 78.0–100.0)
Platelets: 235 10*3/uL (ref 150–400)
RDW: 16.6 % — ABNORMAL HIGH (ref 11.5–15.5)
WBC: 12.5 10*3/uL — ABNORMAL HIGH (ref 4.0–10.5)

## 2011-03-12 ENCOUNTER — Other Ambulatory Visit: Payer: Medicare Other

## 2011-03-13 NOTE — Discharge Summary (Signed)
NAMETYERA, HANSLEY NO.:  [id number]  MEDICAL RECORD NO.:  [id number]  LOCATION:  5506                         FACILITY:  [facility]  PHYSICIAN:  Lonia Blood, M.D.       DATE OF BIRTH:  November 15, 1932  DATE OF ADMISSION:  03/08/2011 DATE OF DISCHARGE:  03/10/2011                              DISCHARGE SUMMARY   PRIMARY CARE PHYSICIAN:  Titus Dubin. Alwyn Ren, MD,FACP,FCCP  DISCHARGE DIAGNOSES: 1. Urinary tract infection. 2. Acute on chronic renal failure. 3. Diabetes mellitus type 2. 4. Polymyalgia rheumatica. 5. Hypertension. 6. Hyperlipidemia.  DISCHARGE MEDICATIONS: 1. Tylenol 650 mg by mouth every 4 hours as needed for pain. 2. Aspirin 81 mg daily. 3. Calcium 1 tablet daily. 4. Coreg 25 mg twice a day. 5. Cefuroxime 500 mg twice a day. 6. Fish oil 1000 mg daily. 7. Folic acid 0.4 mg daily. 8. Lasix 40 mg daily as needed. 9. Humalog 8 units 3 times a day with meals. 10.Lantus 40 units in the morning. 11.Lipitor 40 mg daily. 12.Osteo Bi-Flex 2 tablets over-the-counter daily. 13.Potassium chloride 20 mEq daily as needed if taking Lasix. 14.Prednisone 10 mg daily. 15.Ramipril 10 mg daily. 16.Vitamin B12 over-the-counter daily. 17.Vitamin D 1000 units daily. 18.Vitamin E 1 tablet daily.  CONDITION ON DISCHARGE:  Ms. Bulman was discharged in good condition.  PHYSICAL EXAMINATION:  GENERAL:  Alert, oriented, no acute distress. VITAL SIGNS:  Temperature 97.4, heart rate 67, respirations 20, blood pressure 130/71, saturation 98% on room air.  She is euvolemic without any shortness of breath, alert, oriented, able to ambulate the floors up and down without problems.  Discharge creatinine is 1.6.  Discharge BUN is 80.  The patient will follow up with Dr. Marga Melnick on Friday, March 16, 2011.  PROCEDURE DONE THIS ADMISSION:  No procedures done.  CONSULTATION:  No consultation obtained.  HISTORY AND PHYSICAL:  Refer to dictated H and P done by Dr.  Mikeal Hawthorne.  Briefly, Ms. Crumbley is a 77 year old woman with multiple medical problems, who presented to the emergency room with increased lethargy and weakness.  She had a creatinine elevated to 2.1.  She was referred for further workup and observation.  HOSPITAL COURSE: 1. Acute on chronic renal failure.  Ms. Steinhauser received 1 day of     intravenous fluids.  Her creatinine improved from a level of 2.1 to     level of 1.8.  This was closer to her baseline creatinine of 1.6.     The patient was titrated down on her steroids and on her Lasix and     told to follow up closely with her primary care physician, Dr.     Marga Melnick. 2. Urinary tract infection.  Ms. Scheper was treated with intravenous     Rocephin.  She was switched to oral cefuroxime to complete 7 days     of antibiotics. 3. Polymyalgia rheumatica.  Ms. Edgecombe was tapered down on prednisone     to 10 mg daily and she will follow up with her primary care     physician to see if the symptoms are improving.     Lonia Blood, M.D.  SL/MEDQ  D:  03/10/2011  T:  03/10/2011  Job:  [id number]  cc:   Titus Dubin. Alwyn Ren, MD,FACP,FCCP  Electronically Signed by Lonia Blood M.D. on 03/13/2011 05:08:13 PM

## 2011-03-15 LAB — CULTURE, BLOOD (ROUTINE X 2)
Culture  Setup Time: 201210051050
Culture: NO GROWTH

## 2011-03-16 ENCOUNTER — Encounter: Payer: Self-pay | Admitting: Internal Medicine

## 2011-03-16 ENCOUNTER — Ambulatory Visit (INDEPENDENT_AMBULATORY_CARE_PROVIDER_SITE_OTHER): Payer: Medicare Other | Admitting: Internal Medicine

## 2011-03-16 DIAGNOSIS — Z23 Encounter for immunization: Secondary | ICD-10-CM

## 2011-03-16 DIAGNOSIS — N259 Disorder resulting from impaired renal tubular function, unspecified: Secondary | ICD-10-CM

## 2011-03-16 DIAGNOSIS — N390 Urinary tract infection, site not specified: Secondary | ICD-10-CM

## 2011-03-16 DIAGNOSIS — M353 Polymyalgia rheumatica: Secondary | ICD-10-CM

## 2011-03-16 DIAGNOSIS — IMO0001 Reserved for inherently not codable concepts without codable children: Secondary | ICD-10-CM

## 2011-03-16 LAB — CBC WITH DIFFERENTIAL/PLATELET
Basophils Absolute: 0.1 10*3/uL (ref 0.0–0.1)
Basophils Relative: 0.4 % (ref 0.0–3.0)
Eosinophils Absolute: 0.3 10*3/uL (ref 0.0–0.7)
Lymphocytes Relative: 32 % (ref 12.0–46.0)
MCHC: 32.5 g/dL (ref 30.0–36.0)
MCV: 88.9 fl (ref 78.0–100.0)
Monocytes Absolute: 1.2 10*3/uL — ABNORMAL HIGH (ref 0.1–1.0)
Neutrophils Relative %: 59.7 % (ref 43.0–77.0)
Platelets: 211 10*3/uL (ref 150.0–400.0)
RBC: 4.33 Mil/uL (ref 3.87–5.11)
RDW: 18.2 % — ABNORMAL HIGH (ref 11.5–14.6)

## 2011-03-16 LAB — BASIC METABOLIC PANEL
BUN: 49 mg/dL — ABNORMAL HIGH (ref 6–23)
Chloride: 108 mEq/L (ref 96–112)
GFR: 35.01 mL/min — ABNORMAL LOW (ref >60.00)
Potassium: 4.5 mEq/L (ref 3.5–5.1)
Sodium: 140 mEq/L (ref 135–145)

## 2011-03-16 MED ORDER — PREDNISONE 2.5 MG PO TABS: 2.5000 mg | ORAL_TABLET | ORAL | Status: DC

## 2011-03-16 NOTE — Patient Instructions (Signed)
BUN, creatinine, and GFR  all assess kidney function. To protect the kidneys it  is important to control your blood pressure and sugar. You should also stay well hydrated. Drink to thirst, up to 40 ounces of water a day. Progressive kidney impairment is typically associated with anemia which is unrelated to  iron deficiency or B12 deficiency.   

## 2011-03-16 NOTE — Progress Notes (Signed)
  Subjective:    Patient ID: Taylor Castaneda, female    DOB: Apr 24, 1933, 77 y.o.   MRN: [id number]  HPI She was hospitalized 10/4-10/6 with a urinary tract infection and dehydration. Her BUN was 93 and creatinine 2.18. Hospital records reviewed and results discussed.  Despite white blood count of 20,900; blood cultures were negative. Results of urine culture are not in the EMR. She completed cefuroxime 500 mg twice a day yesterday.   She denied dysuria, hematuria, or pyuria. She presented with profound fatigue. She denied chest pain, abdominal pain, shortness of breath, melena, or rectal bleeding.  Her upper arm/shoulder arthralgia symptoms have dramatically improved with low dose prednisone. This was decreased to 10 mg in the hospital.  FBS since D/C: 93-138; no 2 hr post meal not checked.    Review of Systems     Objective:   Physical Exam   She appears healthy and well-nourished.  She has no lymphadenopathy and the neck or axilla.  Heart rhythm is regular; heart sounds are somewhat distant  Chest is clear with no rales or increased work of breathing  She has excellent range of motion of the shoulders with no pain.  Abdomen is soft and nontender with no masses or distention.        Assessment & Plan:  #1 urinary tract infection; clinically resolving  #2 polymyalgia rheumatica; dramatically improved on low-dose prednisone  #3 diabetes; control improving  #4 renal insufficiency with recent dehydration  Plan: #1 rheumatology assessment as ordered. Prednisone will be weaned in the interim  #2 followup with endocrinologist  #3 recheck renal function. Encourage hydration.

## 2011-03-16 NOTE — Progress Notes (Signed)
Addended by: Edgardo Roys on: 03/16/2011 09:13 AM   Modules accepted: Orders

## 2011-03-17 NOTE — H&P (Signed)
  [name], Taylor Castaneda NO.:  [id number]  MEDICAL RECORD NO.:  [id number]  LOCATION:  5506                         FACILITY:  [facility]  PHYSICIAN:  Lonia Blood, M.D.      DATE OF BIRTH:  [birth year]  DATE OF ADMISSION:  03/08/2011 DATE OF DISCHARGE:                             HISTORY & PHYSICAL   ADDENDUM.  PRIMARY CARE PHYSICIAN:  Titus Dubin. Alwyn Ren, MD, FACP, [facility]   Please refer to my consult note earlier which is now an admission H and P.  Further findings on the patient included evidence of acute renal insufficiency.  Her creatinine is now 2.18, apparently it was normal just a few weeks ago.  She seemed to be also mildly dehydrated with BUN of 93 when previously it was normal.  We will, therefore, proceed to admit the patient for observation and hydrate the patient overnight.  I will also continue with IV antibiotics for her UTI.  Her potassium was mildly elevated at 5.4 also, so we will work her off for possible hyperkalemia.     Lonia Blood, M.D.     Verlin Grills  D:  03/09/2011  T:  03/09/2011  Job:  [id number]  Electronically Signed by Lonia Blood M.D. on 03/17/2011 03:24:19 PM

## 2011-03-17 NOTE — Consult Note (Signed)
Taylor Castaneda, Taylor Castaneda NO.:  [id number]  MEDICAL RECORD NO.:  [id number]  LOCATION:  [facility]                         FACILITY:  [facility]  PHYSICIAN:  Lonia Blood, M.D.      DATE OF BIRTH:  Nov 08, 1932  DATE OF CONSULTATION:  03/09/2011 DATE OF DISCHARGE:                                CONSULTATION   PRIMARY CARE PHYSICIAN:  Titus Dubin. Alwyn Ren, MD, FACP, FCCP  PRESENTING COMPLAINT:  Fatigue and sleepiness.  HISTORY OF PRESENT ILLNESS:  The patient is a 77 year old female who presented to the emergency room with excessive sleepiness and tiredness for about a week.  She feels weaker, all she wants to do is to sleep, but she is awake, alert, oriented, no confusion.  She denied any fever or chills.  No nausea, vomiting, or diarrhea.  She was diagnosed with polymyalgia rheumatica about 2 weeks ago, started on prednisone taper. She is currently on 10 mg of prednisone a day.  The patient was found to have evidence of urinary tract infection in the ED, although she is afebrile, but she has leukocytosis.  She has been on steroid which may explain the leukocytosis and the probably mild immunosuppression from prednisone.  PAST MEDICAL HISTORY:  Significant for diabetes and hypertension as well as PMR, and hyperlipidemia.  ALLERGIES:  No known drug allergies.  MEDICATIONS:  Aspirin, Coreg, folic acid, Lantus, ramipril, Lasix, Lipitor, and Humulin U.  SOCIAL HISTORY:  The patient lives in [place] with family.  No tobacco.  No alcohol.  No IV drug use.  FAMILY HISTORY:  Noncontributory.  REVIEW OF SYSTEMS:  All system are reviewed and are negative except per HPI.  PHYSICAL EXAMINATION:  VITAL SIGNS:  Temperature 97.7, blood pressure 131/47, pulse 79, respiratory rate 24, and saturations 99% on room air. GENERAL:  She is awake, alert, oriented, very pleasant woman who is in no acute distress. HEENT:  PERRL.  EOMI.  No pallor.  No jaundice.  No rhinorrhea. NECK:  Supple.  No  visible JVD.  No lymphadenopathy. RESPIRATORY:  Good air entry bilaterally.  No wheezes.  No rales.  No crackles. CARDIOVASCULAR:  She has S1 and S2.  No audible murmur. ABDOMEN:  Soft, full, and nontender with positive bowel sounds. EXTREMITIES:  No edema, cyanosis, or clubbing. SKIN:  No rashes.  No ulcers.  LABORATORY DATA:  Urinalysis showed a hazy urine with positive leukocyte esterase, few epithelial cells, wbc's 11-20 with few bacteria.  White count 20.9 with left shift, ANC of 14.8, hemoglobin 14.4, and platelet 301.  Sodium 133, potassium 5.4, chloride 99, CO2 of 20, her glucose is 204, BUN 93, creatinine 2.18, and calcium of 10.4.  The rest of the LFTs within normal.  Chest x-ray showed no evidence of active pulmonary disease.  Her EKG showed normal sinus rhythm with a rate of 63, no significant ST-T wave changes.  ASSESSMENT:  This is a 77 year old female presenting with fatigue and evidence of urinary tract infection.  I have held discussion with the patient about options.  The patient will rather go home on oral antibiotics since she is not having any fever or chills.  Also, she is able to  eat and drink.  The only thing is she feels sleepy and weak and tired, but she is able to do all her ADLs.  My recommendation therefore will be to send the patient home on oral Septra double strength 1 tablet b.i.d. for the next 5 days.  I have discussed with the patient and her daughter that if she is not actually any better or if she has any spike of fever or gets weaker, we will be glad to see her back here and probably admit her for inpatient treatment.  She has already got a dose of 1 g Rocephin today, so she can resume with the Septra in the morning.     Lonia Blood, M.D.     Verlin Grills  D:  03/09/2011  T:  03/09/2011  Job:  [id number]  Electronically Signed by Lonia Blood M.D. on 03/17/2011 03:24:08 PM

## 2011-03-20 ENCOUNTER — Telehealth: Payer: Self-pay

## 2011-03-20 LAB — URINE CULTURE: Colony Count: >=100000

## 2011-03-20 MED ORDER — CIPROFLOXACIN HCL 500 MG PO TABS: ORAL_TABLET | ORAL | Status: DC

## 2011-03-20 NOTE — Telephone Encounter (Signed)
Left message on voicemail with patient's results and informed her rx called in, patient to call and schedule appointment for lab (urine sample) culture [phone]

## 2011-03-20 NOTE — Telephone Encounter (Signed)
Message copied by Edgardo Roys on Tue Mar 20, 2011  5:10 PM ------      Message from: Pecola Lawless      Created: Tue Mar 20, 2011  2:03 PM       Criteria for a significant Urinary Tract Infection (UTI) are: over 100,000 colonies of a single, not multiple  Bacteria.      Please take Rx for UTI; Cipro 500 mg twice a day X 10 days, then reculture urine.

## 2011-03-21 ENCOUNTER — Ambulatory Visit: Payer: Medicare Other

## 2011-03-23 ENCOUNTER — Ambulatory Visit (INDEPENDENT_AMBULATORY_CARE_PROVIDER_SITE_OTHER): Payer: Medicare Other | Admitting: *Deleted

## 2011-03-23 ENCOUNTER — Other Ambulatory Visit: Payer: Self-pay | Admitting: Internal Medicine

## 2011-03-23 DIAGNOSIS — Z23 Encounter for immunization: Secondary | ICD-10-CM

## 2011-03-26 ENCOUNTER — Other Ambulatory Visit: Payer: Self-pay | Admitting: Internal Medicine

## 2011-03-30 ENCOUNTER — Other Ambulatory Visit: Payer: Self-pay | Admitting: Internal Medicine

## 2011-03-30 DIAGNOSIS — N390 Urinary tract infection, site not specified: Secondary | ICD-10-CM

## 2011-04-02 ENCOUNTER — Other Ambulatory Visit: Payer: Medicare Other

## 2011-04-02 DIAGNOSIS — N390 Urinary tract infection, site not specified: Secondary | ICD-10-CM

## 2011-04-02 NOTE — Progress Notes (Signed)
Labs only

## 2011-04-04 LAB — URINE CULTURE: Colony Count: >=100000

## 2011-04-05 ENCOUNTER — Telehealth: Payer: Self-pay | Admitting: *Deleted

## 2011-04-05 MED ORDER — NITROFURANTOIN MONOHYD MACRO 100 MG PO CAPS: 100.0000 mg | ORAL_CAPSULE | Freq: Two times a day (BID) | ORAL | Status: AC

## 2011-04-05 NOTE — Telephone Encounter (Signed)
RX sent to pharm on file.

## 2011-04-05 NOTE — Telephone Encounter (Signed)
Message copied by Luisa Dago on Thu Apr 05, 2011  2:54 PM ------      Message from: Pecola Lawless      Created: Thu Apr 05, 2011  2:51 PM       Despite 10 days of ciprofloxacin; urinary tract  infection persists. Please take generic Macrobid 100 mg twice a day for 10 days and then reculture the urine. If this fails to eradicate infection; urology referral as indicated.

## 2011-04-08 ENCOUNTER — Other Ambulatory Visit: Payer: Self-pay | Admitting: Internal Medicine

## 2011-04-09 ENCOUNTER — Telehealth: Payer: Self-pay

## 2011-04-09 NOTE — Telephone Encounter (Signed)
Patient called back and requested for paperwork to be mailed. Done

## 2011-04-09 NOTE — Telephone Encounter (Signed)
Left message on voicemail for patient, informing her paperwork for insurance that was dropped off on 04-02-11 is ready. Patient to call and inform me if she would like paperwork faxed, mailed or pick-up

## 2011-04-17 ENCOUNTER — Other Ambulatory Visit: Payer: Self-pay | Admitting: Internal Medicine

## 2011-04-18 NOTE — Telephone Encounter (Signed)
Dr.Hopper please advise on request for prednisone 

## 2011-04-18 NOTE — Telephone Encounter (Signed)
Decrease dose to 5 mg daily dispense 30; office visit followup prior to next refill

## 2011-05-06 ENCOUNTER — Other Ambulatory Visit: Payer: Self-pay | Admitting: Internal Medicine

## 2011-05-15 ENCOUNTER — Other Ambulatory Visit: Payer: Self-pay | Admitting: Internal Medicine

## 2011-05-15 NOTE — Telephone Encounter (Signed)
OK but repeat sed rate monthly

## 2011-05-15 NOTE — Telephone Encounter (Signed)
Dr.Hopper please advise 

## 2011-06-13 ENCOUNTER — Other Ambulatory Visit: Payer: Self-pay | Admitting: Internal Medicine

## 2011-06-14 NOTE — Telephone Encounter (Signed)
OK times one; she should be monitoring her sedimentation rate every 4-6 weeks.

## 2011-06-14 NOTE — Telephone Encounter (Signed)
Please advise refill on Prednisone refill?

## 2011-06-15 ENCOUNTER — Other Ambulatory Visit: Payer: Self-pay | Admitting: Internal Medicine

## 2011-06-15 DIAGNOSIS — Z0000 Encounter for general adult medical examination without abnormal findings: Secondary | ICD-10-CM

## 2011-06-15 MED ORDER — PREDNISONE 5 MG PO TABS: 5.0000 mg | ORAL_TABLET | Freq: Every day | ORAL | Status: DC

## 2011-06-15 NOTE — Telephone Encounter (Signed)
Addended by: Court Joy on: 06/15/2011 08:14 AM   Modules accepted: Orders

## 2011-06-15 NOTE — Telephone Encounter (Signed)
Patient informed and is calling [facility] to make lab appt for sed rate

## 2011-06-18 ENCOUNTER — Other Ambulatory Visit: Payer: Medicare Other

## 2011-06-18 ENCOUNTER — Other Ambulatory Visit (INDEPENDENT_AMBULATORY_CARE_PROVIDER_SITE_OTHER): Payer: Medicare Other

## 2011-06-18 DIAGNOSIS — Z0000 Encounter for general adult medical examination without abnormal findings: Secondary | ICD-10-CM

## 2011-06-19 NOTE — Telephone Encounter (Signed)
Medication updated

## 2011-07-09 ENCOUNTER — Other Ambulatory Visit: Payer: Self-pay | Admitting: Internal Medicine

## 2011-07-17 ENCOUNTER — Other Ambulatory Visit: Payer: Self-pay | Admitting: Internal Medicine

## 2011-07-17 DIAGNOSIS — R700 Elevated erythrocyte sedimentation rate: Secondary | ICD-10-CM

## 2011-07-18 ENCOUNTER — Other Ambulatory Visit (INDEPENDENT_AMBULATORY_CARE_PROVIDER_SITE_OTHER): Payer: Medicare Other

## 2011-07-18 DIAGNOSIS — R700 Elevated erythrocyte sedimentation rate: Secondary | ICD-10-CM

## 2011-07-18 LAB — SEDIMENTATION RATE: Sed Rate: 48 mm/hr — ABNORMAL HIGH (ref 0–22)

## 2011-07-25 ENCOUNTER — Other Ambulatory Visit: Payer: Self-pay | Admitting: Internal Medicine

## 2011-07-26 NOTE — Telephone Encounter (Signed)
Please verify whether she has seen an endocrinologist; I believe I referred her. Endocrinologist should order this.

## 2011-07-26 NOTE — Telephone Encounter (Signed)
Dr.Hopper please advise: last  A1c 06/19/10  (10.7), last OV 03/16/11

## 2011-07-26 NOTE — Telephone Encounter (Signed)
Left message on voicemail for patient to call and verify if she is seeing a specialist, informed patient if yes she will need to contact he pharmacy also and inform them to send rx to specialist

## 2011-07-27 ENCOUNTER — Telehealth: Payer: Self-pay | Admitting: Internal Medicine

## 2011-07-27 NOTE — Telephone Encounter (Signed)
Noted  

## 2011-07-27 NOTE — Telephone Encounter (Signed)
Patient called & stated she is seeing Dr. Talmage Nap, for her Diabetes now.

## 2011-08-01 ENCOUNTER — Telehealth: Payer: Self-pay | Admitting: Internal Medicine

## 2011-08-01 NOTE — Telephone Encounter (Signed)
Patient states she needs to come in for labs. She would like to come on 3-20. What does she need?

## 2011-08-01 NOTE — Telephone Encounter (Signed)
Per Dr.Hopper Sed Rate

## 2011-08-22 ENCOUNTER — Other Ambulatory Visit (INDEPENDENT_AMBULATORY_CARE_PROVIDER_SITE_OTHER): Payer: Medicare Other

## 2011-08-22 DIAGNOSIS — C3490 Malignant neoplasm of unspecified part of unspecified bronchus or lung: Secondary | ICD-10-CM

## 2011-08-24 ENCOUNTER — Telehealth: Payer: Self-pay

## 2011-08-24 NOTE — Telephone Encounter (Signed)
Message copied by Maurice Small on Fri Aug 24, 2011  2:41 PM ------      Message from: Pecola Lawless      Created: Thu Aug 23, 2011  6:29 PM       Sedimentation rate is now normal; please see me before refilling the prednisone

## 2011-08-24 NOTE — Telephone Encounter (Signed)
Spoke with patient, patient to be seen Monday at 1:00 pm

## 2011-08-27 ENCOUNTER — Encounter: Payer: Self-pay | Admitting: Internal Medicine

## 2011-08-27 ENCOUNTER — Ambulatory Visit (INDEPENDENT_AMBULATORY_CARE_PROVIDER_SITE_OTHER): Payer: Medicare Other | Admitting: Internal Medicine

## 2011-08-27 VITALS — BP 140/68 | HR 83 | Wt 153.2 lb

## 2011-08-27 DIAGNOSIS — M810 Age-related osteoporosis without current pathological fracture: Secondary | ICD-10-CM

## 2011-08-27 DIAGNOSIS — M353 Polymyalgia rheumatica: Secondary | ICD-10-CM | POA: Insufficient documentation

## 2011-08-27 DIAGNOSIS — R0609 Other forms of dyspnea: Secondary | ICD-10-CM

## 2011-08-27 DIAGNOSIS — R0600 Dyspnea, unspecified: Secondary | ICD-10-CM

## 2011-08-27 DIAGNOSIS — R0989 Other specified symptoms and signs involving the circulatory and respiratory systems: Secondary | ICD-10-CM

## 2011-08-27 DIAGNOSIS — R635 Abnormal weight gain: Secondary | ICD-10-CM

## 2011-08-27 MED ORDER — PREDNISONE 2 MG PO TBEC: 2.0000 mg | DELAYED_RELEASE_TABLET | Freq: Every day | ORAL | Status: DC

## 2011-08-27 NOTE — Progress Notes (Signed)
  Subjective:    Patient ID: Taylor Castaneda, female    DOB: [birth year], 78 y.o.   MRN: [id number]  HPI She is not having shoulder pain since starting the prednisone in 9/12. She remains on 5 mg each evening. She has been on prednisone for approximately 6 months.  Her bone mineral density studies are being performed to an outside facility. She will verify when it was last done. The interval should be every 25 months normally. Because of her steroid administration and diagnosis of osteoporosis; bone density should be monitored every 12 months.    Review of Systems She sees Dr Talmage Nap who increased Lantus to 50 u & Humalog to 10 u pre each meal for A1c of 9%. F/U 09/26/11. She states her daughter feel she needs a stress test because of exertional dyspnea walking to mailbox. She is walking on a treadmill at 2-2.5 miles per hour for 30 minutes 3 times a week without chest pain, palpitations, shortness of breath, or claudication.  She's had a 20 pound weight gain since October; she questions whether this is related to Lantus     Objective:   Physical Exam Gen.: Healthy and well-nourished in appearance. Alert, appropriate and cooperative throughout exam. Appears younger than stated age   Eyes: No corneal or conjunctival inflammation noted.  Mouth: Oral mucosa and oropharynx reveal no lesions or exudates. Complete dentures Neck: No deformities, masses, or tenderness noted. Range of motion & Thyroid normal Lungs: Normal respiratory effort; chest expands symmetrically. Lungs are clear to auscultation without rales, wheezes, or increased work of breathing. Heart: Normal rate and rhythm. Normal S1 and S2. No gallop, click, or rub. S 4 w/o  murmur. Abdomen: Bowel sounds normal; abdomen soft and nontender. No masses, organomegaly or hernias noted.                                             Musculoskeletal/extremities: lordosis noted of  the thoracic  Spine; R thoracic musculature > L suggesting scoliosis. No  clubbing, cyanosis, edema, or deformity noted. Range of motion  normal .Tone & strength  Normal.Crepitus & fusiform changes L knee. Nail health  Good except R great toenail. Vascular: Carotid, radial artery, dorsalis pedis and  posterior tibial pulses are full and equal. No bruits present. Neurologic: Alert and oriented x3. Deep tendon reflexes symmetrical and normal.          Skin: Intact without suspicious lesions or rashes. Lymph: No cervical, axillary lymphadenopathy present. Psych: Mood and affect are normal. Normally interactive                                                                                         Assessment & Plan:  #1 dyspnea on exertion, variable.  EKG reveals early repolarization changes which are stable. There is no ischemic change. She was incomplete right bundle branch block which is also stable. Because of her daughter's concerns and the comorbidities; referral will be made to cardiology.  Plan: See orders and recommendations

## 2011-08-27 NOTE — Patient Instructions (Addendum)
Please  schedule sed rate in 3& 1/2 weeks. PLEASE BRING THESE INSTRUCTIONS TO FOLLOW UP  LAB APPOINTMENT.This will guarantee correct labs are drawn, eliminating need for repeat blood sampling ( needle sticks ! ). Diagnoses /Codes: PMR  Eat a low-fat diet with lots of fruits and vegetables, up to 7-9 servings per day. Consume less than 30 ( preferably ZERO) grams of sugar per day from foods & drinks with High Fructose Corn Syrup (HFCS) sugar as #1,2,3 or # 4 on label.Whole [employer], [employer] & [employer] do not carry products with HFCS. Follow a  low carb nutrition program such as [place] or [employer]  to prevent Diabetes progression . White carbohydrates (potatoes, rice, bread, and pasta) have a high spike of sugar and a high load of sugar. For example a  baked potato has a cup of sugar and a  french fry  2 teaspoons of sugar. Yams, wild  rice, whole grained bread &  wheat pasta have been much lower spike and load of  sugar. Portions should be the size of a deck of cards or your palm.

## 2011-08-27 NOTE — Assessment & Plan Note (Signed)
Bone mineral density should be performed  @ 12 months due  to the greater than 6 months of steroids for polymyalgia rheumatica

## 2011-08-27 NOTE — Assessment & Plan Note (Signed)
Prednisone will be decreased by 1 mg per month with monitor of  symptoms

## 2011-09-04 ENCOUNTER — Telehealth: Payer: Self-pay | Admitting: Internal Medicine

## 2011-09-04 ENCOUNTER — Other Ambulatory Visit: Payer: Self-pay | Admitting: Internal Medicine

## 2011-09-04 DIAGNOSIS — R0600 Dyspnea, unspecified: Secondary | ICD-10-CM

## 2011-09-04 DIAGNOSIS — M810 Age-related osteoporosis without current pathological fracture: Secondary | ICD-10-CM

## 2011-09-04 NOTE — Telephone Encounter (Signed)
Patient states it has been 2-yrs since her last bone density test & she would like to know if Alwyn Ren wants her to have another one? Also, she states she was to get a referral to a cardiologist & would like to follow up on that as well? Please call at [phone]

## 2011-09-04 NOTE — Telephone Encounter (Signed)
Dr.Hopper please advise 

## 2011-09-07 ENCOUNTER — Other Ambulatory Visit (INDEPENDENT_AMBULATORY_CARE_PROVIDER_SITE_OTHER): Payer: Medicare Other

## 2011-09-07 DIAGNOSIS — E785 Hyperlipidemia, unspecified: Secondary | ICD-10-CM

## 2011-09-18 ENCOUNTER — Ambulatory Visit (INDEPENDENT_AMBULATORY_CARE_PROVIDER_SITE_OTHER): Payer: Medicare Other | Admitting: Cardiovascular Disease

## 2011-09-18 ENCOUNTER — Encounter: Payer: Self-pay | Admitting: Cardiovascular Disease

## 2011-09-18 VITALS — BP 166/73 | HR 76 | Ht 60.0 in | Wt 155.0 lb

## 2011-09-18 DIAGNOSIS — IMO0001 Reserved for inherently not codable concepts without codable children: Secondary | ICD-10-CM

## 2011-09-18 DIAGNOSIS — E785 Hyperlipidemia, unspecified: Secondary | ICD-10-CM

## 2011-09-18 DIAGNOSIS — I10 Essential (primary) hypertension: Secondary | ICD-10-CM

## 2011-09-18 DIAGNOSIS — R0600 Dyspnea, unspecified: Secondary | ICD-10-CM

## 2011-09-18 DIAGNOSIS — R0989 Other specified symptoms and signs involving the circulatory and respiratory systems: Secondary | ICD-10-CM

## 2011-09-18 DIAGNOSIS — R0609 Other forms of dyspnea: Secondary | ICD-10-CM

## 2011-09-18 NOTE — Progress Notes (Signed)
Patient ID: Taylor Castaneda, female   DOB: [birth year], 78 y.o.   MRN: [id number] 78 yo with progressive dyspnea over two years.  Long standing DM on insulin last two years.  No chest pain but marked change in exertional dyspnea.  Had LE edema last year and palced on diuretic.  No previous stress test or echo.  Compliant with meds  Sees Balin for DM.  Thinks her weight has gone up since being on insulin.  Quit smoking in 70's with no history of chronic lung disease and normal CXR last year.  No cough, sputum fever.  No history of DVT or PE.  DM poorly controlled with A1c over 9 !!  ROS: Denies fever, malais, weight loss, blurry vision, decreased visual acuity, cough, sputum, SOB, hemoptysis, pleuritic pain, palpitaitons, heartburn, abdominal pain, melena, lower extremity edema, claudication, or rash.  All other systems reviewed and negative   General: Affect appropriate Healthy:  appears stated age HEENT: normal Neck supple with no adenopathy JVP normal no bruits no thyromegaly Lungs clear with no wheezing and good diaphragmatic motion Heart:  S1/S2 no murmur,rub, gallop or click PMI normal Abdomen: benighn, BS positve, no tenderness, no AAA no bruit.  No HSM or HJR Distal pulses intact with no bruits No edema Neuro non-focal Skin warm and dry No muscular weakness  Medications Current Outpatient Prescriptions  Medication Sig Dispense Refill  . aspirin 81 MG tablet Take 81 mg by mouth daily.        Marland Kitchen atorvastatin (LIPITOR) 40 MG tablet TAKE 1 TABLET BY MOUTH EVERY NIGHT AT BEDTIME  90 tablet  3  . B-D ULTRAFINE III SHORT PEN 31G X 8 MM MISC USE AS DIRECTED WITH INSULIN  100 each  3  . calcium carbonate (OS-CAL) 600 MG TABS Take 600 mg by mouth daily.        . carvedilol (COREG) 25 MG tablet TAKE 1 TABLET BY MOUTH TWICE DAILY  180 tablet  2  . Cholecalciferol (VITAMIN D-3) 5000 UNITS TABS Take 1 tablet by mouth daily.      Marland Kitchen CINNAMON PO Take by mouth daily.      . folic acid (FOLVITE) 400  MCG tablet Take 400 mcg by mouth daily.        . furosemide (LASIX) 40 MG tablet Take 40 mg by mouth. Patient to take only if she gains 2 pounds, if no weight gain patient is to hold       . glucose blood (FREESTYLE LITE) test strip Use as instructed  100 each  2  . insulin glargine (LANTUS SOLOSTAR) 100 UNIT/ML injection        . insulin lispro (HUMALOG KWIKPEN) 100 UNIT/ML injection Inject into the skin 3 (three) times daily before meals. 10 units three times daily with each meal      . Misc Natural Products (OSTEO BI-FLEX JOINT SHIELD) TABS Take by mouth. 2 by mouth daily      . Omega-3 Fatty Acids (FISH OIL) 1000 MG CAPS Take 1,000 mg by mouth daily.        . potassium chloride (KLOR-CON) 20 MEQ packet Take 20 mEq by mouth daily. Only when pt takes lasix      . potassium chloride SA (K-DUR,KLOR-CON) 20 MEQ tablet TAKE 1 TABLET BY MOUTH TWICE DAILY  60 tablet  5  . PredniSONE 2 MG TBEC Take 2 mg by mouth daily. 2 pills daily (4 mg total )  60 tablet  0  . ramipril (ALTACE)  10 MG capsule TAKE ONE CAPSULE BY MOUTH DAILY  90 capsule  2  . vitamin E 400 UNIT capsule Take 400 Units by mouth daily.          Allergies Pravastatin sodium  Family History: Family History  Problem Relation Age of Onset  . Heart attack Father 75  . Stroke Father 73  . Diabetes Father   . Hypertension Father   . Breast cancer Mother   . Diabetes Sister   . Breast cancer Sister     Social History: History   Social History  . Marital Status: Widowed    Spouse Name: N/A    Number of Children: N/A  . Years of Education: N/A   Occupational History  . Not on file.   Social History Main Topics  . Smoking status: Former Games developer  . Smokeless tobacco: Not on file   Comment: Smoked for 3 years in 1970's  . Alcohol Use: Yes     Social  . Drug Use: No  . Sexually Active: Not on file   Other Topics Concern  . Not on file   Social History Narrative  . No narrative on file     Electrocardiogram:  Assessment and Plan

## 2011-09-18 NOTE — Assessment & Plan Note (Signed)
Well controlled.  Continue current medications and low sodium Dash type diet.    

## 2011-09-18 NOTE — Assessment & Plan Note (Signed)
F/U Dr Romero Belling.  Intolerant to [place]  Consider different form of insulin given weight gain

## 2011-09-18 NOTE — Patient Instructions (Signed)
Your physician recommends that you schedule a follow-up appointment in: PENDING TEST RESULTS Your physician has requested that you have a lexiscan myoview. For further information please visit [email or web address] Please follow instruction sheet, as given. DX DYSPENA Your physician has requested that you have an echocardiogram. Echocardiography is a painless test that uses sound waves to create images of your heart. It provides your doctor with information about the size and shape of your heart and how well your heart's chambers and valves are working. This procedure takes approximately one hour. There are no restrictions for this procedure.  DX DYSPNEA

## 2011-09-18 NOTE — Assessment & Plan Note (Signed)
No obvious pulmonary etiology.  R/O anginal equivalent in IDDM with poor control.  Lexiscan myovue and echo

## 2011-09-18 NOTE — Assessment & Plan Note (Signed)
Cholesterol is at goal.  Continue current dose of statin and diet Rx.  No myalgias or side effects.  F/U  LFT's in 6 months. Lab Results  Component Value Date   LDLCALC 48 02/23/2011

## 2011-09-25 ENCOUNTER — Ambulatory Visit (HOSPITAL_COMMUNITY): Payer: Medicare Other | Attending: Cardiology

## 2011-09-25 ENCOUNTER — Other Ambulatory Visit: Payer: Self-pay

## 2011-09-25 DIAGNOSIS — E669 Obesity, unspecified: Secondary | ICD-10-CM | POA: Insufficient documentation

## 2011-09-25 DIAGNOSIS — R0989 Other specified symptoms and signs involving the circulatory and respiratory systems: Secondary | ICD-10-CM | POA: Insufficient documentation

## 2011-09-25 DIAGNOSIS — I519 Heart disease, unspecified: Secondary | ICD-10-CM | POA: Insufficient documentation

## 2011-09-25 DIAGNOSIS — E785 Hyperlipidemia, unspecified: Secondary | ICD-10-CM | POA: Insufficient documentation

## 2011-09-25 DIAGNOSIS — R0609 Other forms of dyspnea: Secondary | ICD-10-CM | POA: Insufficient documentation

## 2011-09-25 DIAGNOSIS — R0600 Dyspnea, unspecified: Secondary | ICD-10-CM

## 2011-09-25 DIAGNOSIS — I10 Essential (primary) hypertension: Secondary | ICD-10-CM | POA: Insufficient documentation

## 2011-09-25 DIAGNOSIS — E119 Type 2 diabetes mellitus without complications: Secondary | ICD-10-CM | POA: Insufficient documentation

## 2011-09-25 DIAGNOSIS — I059 Rheumatic mitral valve disease, unspecified: Secondary | ICD-10-CM | POA: Insufficient documentation

## 2011-09-26 ENCOUNTER — Ambulatory Visit (HOSPITAL_COMMUNITY): Payer: Medicare Other | Attending: Cardiovascular Disease | Admitting: Radiology

## 2011-09-26 ENCOUNTER — Telehealth: Payer: Self-pay | Admitting: Cardiovascular Disease

## 2011-09-26 VITALS — BP 208/85 | Ht 60.0 in | Wt 154.0 lb

## 2011-09-26 DIAGNOSIS — Z8249 Family history of ischemic heart disease and other diseases of the circulatory system: Secondary | ICD-10-CM | POA: Insufficient documentation

## 2011-09-26 DIAGNOSIS — R0600 Dyspnea, unspecified: Secondary | ICD-10-CM

## 2011-09-26 DIAGNOSIS — E119 Type 2 diabetes mellitus without complications: Secondary | ICD-10-CM | POA: Insufficient documentation

## 2011-09-26 DIAGNOSIS — Z794 Long term (current) use of insulin: Secondary | ICD-10-CM | POA: Insufficient documentation

## 2011-09-26 DIAGNOSIS — R0989 Other specified symptoms and signs involving the circulatory and respiratory systems: Secondary | ICD-10-CM | POA: Insufficient documentation

## 2011-09-26 DIAGNOSIS — E663 Overweight: Secondary | ICD-10-CM | POA: Insufficient documentation

## 2011-09-26 DIAGNOSIS — R0602 Shortness of breath: Secondary | ICD-10-CM

## 2011-09-26 DIAGNOSIS — E785 Hyperlipidemia, unspecified: Secondary | ICD-10-CM | POA: Insufficient documentation

## 2011-09-26 DIAGNOSIS — R0609 Other forms of dyspnea: Secondary | ICD-10-CM | POA: Insufficient documentation

## 2011-09-26 DIAGNOSIS — I10 Essential (primary) hypertension: Secondary | ICD-10-CM | POA: Insufficient documentation

## 2011-09-26 DIAGNOSIS — Z87891 Personal history of nicotine dependence: Secondary | ICD-10-CM | POA: Insufficient documentation

## 2011-09-26 MED ORDER — TECHNETIUM TC 99M TETROFOSMIN IV KIT
11.0000 | PACK | Freq: Once | INTRAVENOUS | Status: AC | PRN
  Administered 2011-09-26: 11 via INTRAVENOUS

## 2011-09-26 MED ORDER — REGADENOSON 0.4 MG/5ML IV SOLN
0.4000 mg | Freq: Once | INTRAVENOUS | Status: AC
  Administered 2011-09-26: 0.4 mg via INTRAVENOUS

## 2011-09-26 MED ORDER — TECHNETIUM TC 99M TETROFOSMIN IV KIT
33.0000 | PACK | Freq: Once | INTRAVENOUS | Status: AC | PRN
  Administered 2011-09-26: 33 via INTRAVENOUS

## 2011-09-26 NOTE — Progress Notes (Signed)
[facility] SITE 3 NUCLEAR MED [address] [place] [address] [phone]  Cardiology Nuclear Med Study  MALISSA SLAY is a 78 y.o. female     MRN : [id number]     DOB: 12/10/1932  Procedure Date: 09/26/2011  Nuclear Med Background Indication for Stress Test:  Evaluation for Ischemia History:  No previous documented CAD. Cardiac Risk Factors: Family History - CAD, History of Smoking, Hypertension, IDDM Type 2, Lipids and Overweight  Symptoms:  DOE   Nuclear Pre-Procedure Caffeine/Decaff Intake:  None NPO After: 9:30pm   Lungs:  clear O2 Sat: 98% on room air. IV 0.9% NS with Angio Cath:  22g  IV Site: R Hand  IV Started by:  Bonnita Levan, RN  Chest Size (in):  36 Cup Size: D  Height: 5' (1.524 m)  Weight:  154 lb (69.854 kg)  BMI:  Body mass index is 30.08 kg/(m^2). Tech Comments:  Took Coreg this AM, BS @ 5:30 AM=220    Nuclear Med Study 1 or 2 day study: 1 day  Stress Test Type:  Treadmill/Lexiscan  Reading MD: Charlton Haws, MD  Order Authorizing Provider:  Charlton Haws, MD  Resting Radionuclide: Technetium 81m Tetrofosmin  Resting Radionuclide Dose: 11.0 mCi   Stress Radionuclide:  Technetium 28m Tetrofosmin  Stress Radionuclide Dose: 33.0 mCi           Stress Protocol Rest HR: 63 Stress HR: 107  Rest BP: Sitting 208/85  Standing  183/79 Stress BP: 173/49  Exercise Time (min): 2:00 METS: n/a   Predicted Max HR: 143 bpm % Max HR: 74.83 bpm Rate Pressure Product: 62952   Dose of Adenosine (mg):  n/a Dose of Lexiscan: 0.4 mg  Dose of Atropine (mg): n/a Dose of Dobutamine: n/a mcg/kg/min (at max HR)  Stress Test Technologist: Smiley Houseman, CMA-N  Nuclear Technologist:  Domenic Polite, CNMT     Rest Procedure:  Myocardial perfusion imaging was performed at rest 45 minutes following the intravenous administration of Technetium 24m Tetrofosmin.  Rest ECG: No acute changes  Stress Procedure:  The patient received IV Lexiscan 0.4 mg over  15-seconds.  Technetium 65m Tetrofosmin injected at 30-seconds.  There were no significant changes with Lexiscan.  Quantitative spect images were obtained after a 45 minute delay.  Stress ECG: No significant change from baseline ECG  QPS Raw Data Images:  Normal; no motion artifact; normal heart/lung ratio. Stress Images:  Normal homogeneous uptake in all areas of the myocardium. Rest Images:  Normal homogeneous uptake in all areas of the myocardium. Subtraction (SDS):  Normal Transient Ischemic Dilatation (Normal <1.22):  1.11 Lung/Heart Ratio (Normal <0.45):  0.51  Quantitative Gated Spect Images QGS EDV:  38 ml QGS ESV:  8 ml  Impression Exercise Capacity:  Lexiscan with low level exercise. BP Response:  Normal blood pressure response. Clinical Symptoms:  No chest pain. ECG Impression:  No significant ST segment change suggestive of ischemia. Comparison with Prior Nuclear Study: No previous nuclear study performed  Overall Impression:  Normal stress nuclear study.  LV Ejection Fraction: 78%.  LV Wall Motion:  NL LV Function; NL Wall Motion   Charlton Haws

## 2011-09-26 NOTE — Telephone Encounter (Signed)
LMTCB ./CY 

## 2011-09-26 NOTE — Telephone Encounter (Signed)
Fu call °Pt returning your call  °

## 2011-09-27 ENCOUNTER — Other Ambulatory Visit (HOSPITAL_COMMUNITY): Payer: Medicare Other

## 2011-09-27 NOTE — Telephone Encounter (Signed)
PT AWARE OF ECHO AND MYOVIEW RESULTS .Taylor Castaneda

## 2011-09-27 NOTE — Telephone Encounter (Signed)
Follow-up:     Patient returned you phone call. Please call back.

## 2011-09-28 ENCOUNTER — Other Ambulatory Visit: Payer: Self-pay | Admitting: Internal Medicine

## 2011-10-18 ENCOUNTER — Other Ambulatory Visit: Payer: Self-pay | Admitting: Internal Medicine

## 2011-10-23 ENCOUNTER — Other Ambulatory Visit: Payer: Self-pay | Admitting: Internal Medicine

## 2011-10-24 NOTE — Telephone Encounter (Signed)
change Rx to prednisone 1 mg TWO pills daily , #60. Repeat sedimentation rate in 3 weeks

## 2011-10-24 NOTE — Telephone Encounter (Signed)
Dr.Hopper please advise 

## 2011-11-25 ENCOUNTER — Other Ambulatory Visit: Payer: Self-pay | Admitting: Internal Medicine

## 2011-11-25 DIAGNOSIS — M353 Polymyalgia rheumatica: Secondary | ICD-10-CM

## 2011-11-26 NOTE — Telephone Encounter (Signed)
Patient needs to call for a NON- fasting lab appointment (Per Dr.Hopper), future order placed

## 2011-12-03 ENCOUNTER — Other Ambulatory Visit (INDEPENDENT_AMBULATORY_CARE_PROVIDER_SITE_OTHER): Payer: Medicare Other

## 2011-12-03 DIAGNOSIS — M353 Polymyalgia rheumatica: Secondary | ICD-10-CM

## 2011-12-03 LAB — SEDIMENTATION RATE: Sed Rate: 20 mm/hr (ref 0–22)

## 2011-12-03 NOTE — Progress Notes (Signed)
Labs only

## 2011-12-10 ENCOUNTER — Other Ambulatory Visit: Payer: Self-pay | Admitting: Internal Medicine

## 2012-01-07 ENCOUNTER — Other Ambulatory Visit: Payer: Self-pay | Admitting: Internal Medicine

## 2012-01-08 NOTE — Telephone Encounter (Signed)
Dr.Hopper please advise 

## 2012-01-08 NOTE — Telephone Encounter (Signed)
No change in dose; recheck sedimentation rate in 4-6 weeks

## 2012-01-23 ENCOUNTER — Other Ambulatory Visit: Payer: Self-pay | Admitting: Internal Medicine

## 2012-01-23 DIAGNOSIS — M353 Polymyalgia rheumatica: Secondary | ICD-10-CM

## 2012-01-24 NOTE — Telephone Encounter (Signed)
Left message on VM informing patient appointment due for labs (future order placed)

## 2012-01-24 NOTE — Telephone Encounter (Signed)
Dr.Hopper please advise 

## 2012-01-24 NOTE — Telephone Encounter (Signed)
Sedimentation rate needs to be rechecked to see if the prednisone dose can be decreased. In the meantime refill #30 pills. Diagnosis polymyalgia rheumatica

## 2012-01-28 ENCOUNTER — Other Ambulatory Visit (INDEPENDENT_AMBULATORY_CARE_PROVIDER_SITE_OTHER): Payer: Medicare Other

## 2012-01-28 ENCOUNTER — Encounter: Payer: Self-pay | Admitting: Internal Medicine

## 2012-01-28 DIAGNOSIS — M353 Polymyalgia rheumatica: Secondary | ICD-10-CM

## 2012-02-18 ENCOUNTER — Other Ambulatory Visit: Payer: Self-pay | Admitting: Internal Medicine

## 2012-03-12 ENCOUNTER — Ambulatory Visit (INDEPENDENT_AMBULATORY_CARE_PROVIDER_SITE_OTHER): Payer: Medicare Other

## 2012-03-12 DIAGNOSIS — Z23 Encounter for immunization: Secondary | ICD-10-CM

## 2012-03-13 ENCOUNTER — Other Ambulatory Visit: Payer: Self-pay | Admitting: Internal Medicine

## 2012-03-14 NOTE — Telephone Encounter (Signed)
Ok # 30; sed rate in 2 weeks

## 2012-03-14 NOTE — Telephone Encounter (Signed)
Pt last seen on 09/04/11 and prednisone last refilled on 9/17. Please advise.

## 2012-04-09 ENCOUNTER — Other Ambulatory Visit: Payer: Self-pay | Admitting: Internal Medicine

## 2012-04-09 NOTE — Telephone Encounter (Signed)
Rx sent.    MW 

## 2012-04-14 ENCOUNTER — Other Ambulatory Visit: Payer: Self-pay | Admitting: Endocrinology

## 2012-04-14 NOTE — Telephone Encounter (Signed)
Please advise 

## 2012-04-14 NOTE — Telephone Encounter (Signed)
Chart shows no visit by me.  Please direst refill request to pcp

## 2012-04-15 NOTE — Telephone Encounter (Signed)
i can't see where i have seen this pt.  Please direct refill request to pcp

## 2012-04-15 NOTE — Telephone Encounter (Signed)
Ok to fill 

## 2012-04-16 ENCOUNTER — Other Ambulatory Visit: Payer: Self-pay | Admitting: Internal Medicine

## 2012-04-16 NOTE — Telephone Encounter (Signed)
Rx sent.    MW 

## 2012-05-19 ENCOUNTER — Other Ambulatory Visit: Payer: Self-pay | Admitting: Internal Medicine

## 2012-05-19 NOTE — Telephone Encounter (Signed)
Dr.Hopper please advise 

## 2012-05-19 NOTE — Telephone Encounter (Signed)
#   15 ; 1 pill M, W, F & Sun.Sed rate needed with OV before next refill

## 2012-05-20 NOTE — Telephone Encounter (Signed)
Pt scheduled for 06/09/12.

## 2012-06-09 ENCOUNTER — Ambulatory Visit (INDEPENDENT_AMBULATORY_CARE_PROVIDER_SITE_OTHER)
Admission: RE | Admit: 2012-06-09 | Discharge: 2012-06-09 | Disposition: A | Discharge status: Home / Self Care | Payer: Medicare Other | Source: Ambulatory Visit | Attending: Internal Medicine | Admitting: Internal Medicine

## 2012-06-09 ENCOUNTER — Encounter: Payer: Self-pay | Admitting: Internal Medicine

## 2012-06-09 ENCOUNTER — Ambulatory Visit (INDEPENDENT_AMBULATORY_CARE_PROVIDER_SITE_OTHER): Payer: Medicare Other | Admitting: Internal Medicine

## 2012-06-09 VITALS — BP 138/80 | HR 85 | Temp 98.2°F | Wt 150.0 lb

## 2012-06-09 DIAGNOSIS — R0600 Dyspnea, unspecified: Secondary | ICD-10-CM

## 2012-06-09 DIAGNOSIS — IMO0001 Reserved for inherently not codable concepts without codable children: Secondary | ICD-10-CM

## 2012-06-09 DIAGNOSIS — M353 Polymyalgia rheumatica: Secondary | ICD-10-CM

## 2012-06-09 DIAGNOSIS — D649 Anemia, unspecified: Secondary | ICD-10-CM

## 2012-06-09 DIAGNOSIS — R0609 Other forms of dyspnea: Secondary | ICD-10-CM

## 2012-06-09 DIAGNOSIS — R0989 Other specified symptoms and signs involving the circulatory and respiratory systems: Secondary | ICD-10-CM

## 2012-06-09 DIAGNOSIS — M810 Age-related osteoporosis without current pathological fracture: Secondary | ICD-10-CM

## 2012-06-09 LAB — CBC WITH DIFFERENTIAL/PLATELET
Basophils Relative: 0.5 % (ref 0.0–3.0)
Eosinophils Absolute: 0.3 10*3/uL (ref 0.0–0.7)
HCT: 38.1 % (ref 36.0–46.0)
Lymphs Abs: 2 10*3/uL (ref 0.7–4.0)
MCHC: 33.6 g/dL (ref 30.0–36.0)
MCV: 94.2 fl (ref 78.0–100.0)
Monocytes Absolute: 0.7 10*3/uL (ref 0.1–1.0)
Neutro Abs: 4.2 10*3/uL (ref 1.4–7.7)
Neutrophils Relative %: 58.6 % (ref 43.0–77.0)
RBC: 4.04 Mil/uL (ref 3.87–5.11)

## 2012-06-09 NOTE — Assessment & Plan Note (Addendum)
Prednisone 1 mg as of 01/28/12.  Sed Rate

## 2012-06-09 NOTE — Assessment & Plan Note (Signed)
R/O significant anemia

## 2012-06-09 NOTE — Assessment & Plan Note (Signed)
Vit D level.

## 2012-06-09 NOTE — Progress Notes (Signed)
  Subjective:    Patient ID: Taylor Castaneda, female    DOB: [birth year], 79 y.o.   MRN: [id number]  HPI She is here for F/U of PMR. She's been on 1 mg of prednisone daily for approximately 4 months  Past medical history/family history/social history were all reviewed and updated. Pertinent data entered into Problem List.    Review of Systems   She has some hip girdle discomfort with household tasks such as vacuuming. She has pain in her knees with walking. She is not having the shoulder girdle discomfort associated with polymyalgia rheumatica. Bone mineral density was completed 8/13 and was stable compared to 8/12. She believes she took Fosamax for approximately 2 years. Dr. Talmage Nap monitors her diabetes. She believes her last A1c was 7.3% . She describes dyspnea on exertion after 50 yards which has been stable over the past 2 years. She denies associated chest pain, palpitations, paroxysmal nocturnal dyspnea, edema, or claudication.      Objective:   Physical Exam Gen.:  well-nourished in appearance. Alert, appropriate and cooperative throughout exam. Mouth: Oral mucosa and oropharynx reveal no lesions or exudates. Dentures in good repair. Neck: No deformities, masses, or tenderness noted. Range of motion & Thyroid normal.No  NVD @ 5 degrees Lungs: Normal respiratory effort; chest expands symmetrically. Lungs are clear to auscultation without rales, wheezes, or increased work of breathing. Heart: Normal rate and rhythm. Normal S1 and S2. No gallop, click, or rub. No murmur. Abdomen: Bowel sounds normal; abdomen soft and nontender. No masses, organomegaly or hernias noted.No HJR                                                                   Musculoskeletal/extremities: Accentuated curvature of upper thoracic  spine. . No clubbing, cyanosis, edema, or deformity noted. Range of motion  normal .Tone & strength  normal.Joints normal. Nail health  Good. No knee effusion Vascular: Carotid, radial  artery, dorsalis pedis and  posterior tibial pulses are full and equal. No bruits present. Neurologic: Alert and oriented x3. Deep tendon reflexes symmetrical and normal.          Skin: Intact without suspicious lesions or rashes. Lymph: No cervical, axillary lymphadenopathy present. Psych: Mood and affect are normal. Normally interactive                                                                                         Assessment & Plan:

## 2012-06-09 NOTE — Patient Instructions (Addendum)
Order for x-rays entered into  the computer; these will be performed at [address] across from [facility]. No appointment is necessary.  Review and correct the record as indicated. Please share record with all medical staff seen.  

## 2012-06-12 LAB — VITAMIN D 1,25 DIHYDROXY: Vitamin D 1, 25 (OH)2 Total: 29 pg/mL (ref 18–72)

## 2012-06-13 ENCOUNTER — Encounter: Payer: Self-pay | Admitting: *Deleted

## 2012-06-18 ENCOUNTER — Telehealth: Payer: Self-pay | Admitting: *Deleted

## 2012-06-18 DIAGNOSIS — R0600 Dyspnea, unspecified: Secondary | ICD-10-CM

## 2012-06-18 NOTE — Telephone Encounter (Signed)
Pt called back stating that she would like to be referred to pulmonary. Left message to call office to advise Pt that referral has been place per result on x-ray.

## 2012-06-19 NOTE — Telephone Encounter (Signed)
Discuss with patient  

## 2012-07-04 ENCOUNTER — Encounter: Payer: Self-pay | Admitting: Emergency Medicine

## 2012-07-04 ENCOUNTER — Ambulatory Visit (INDEPENDENT_AMBULATORY_CARE_PROVIDER_SITE_OTHER): Payer: Medicare Other | Admitting: Emergency Medicine

## 2012-07-04 VITALS — BP 160/80 | HR 66 | Temp 97.7°F | Ht 60.0 in | Wt 154.6 lb

## 2012-07-04 DIAGNOSIS — R0600 Dyspnea, unspecified: Secondary | ICD-10-CM

## 2012-07-04 DIAGNOSIS — R0989 Other specified symptoms and signs involving the circulatory and respiratory systems: Secondary | ICD-10-CM

## 2012-07-04 DIAGNOSIS — R0609 Other forms of dyspnea: Secondary | ICD-10-CM

## 2012-07-04 NOTE — Progress Notes (Signed)
Subjective:    Patient ID: Taylor Castaneda, female    DOB: 04-20-1933, 79 y.o.   MRN: [id number]  HPI 79 yo woman, former tobacco (minimal exposure), hx of DM, renal insuff, HTN, colon CA, PMR on chronic steroids - was started on 10mg  about a year ago, weaning and just stopped this month (pain now better). She has been having exertional dyspnea for about a year. This prompted cardiac workup (reassuring) and a CXR. She does not have CP, cough or wheeze with exertion. She may hear some wheeze at night when supine but not assos w dyspnea. She has gained about 20 lbs over the last year. She has felt "bad" for the last few months, not just SOB, no energy. Hgb 12.5 in 1/14.    CXR 06/11/12 with pectus excavatum, cardiomegaly, no other abnormalities TTE 09/25/11 -- evidence for diastolic dysfxn Exercise nuclear scan 09/27/11 -- normal LV fxn, no evidence ischemia    Review of Systems  Constitutional: Positive for unexpected weight change. Negative for fever.  HENT: Negative for ear pain, nosebleeds, congestion, sore throat, rhinorrhea, sneezing, trouble swallowing, dental problem, postnasal drip and sinus pressure.   Eyes: Negative for redness and itching.  Respiratory: Positive for shortness of breath and wheezing. Negative for cough and chest tightness.   Cardiovascular: Negative for palpitations and leg swelling.  Gastrointestinal: Negative for nausea and vomiting.  Genitourinary: Negative for dysuria.  Musculoskeletal: Positive for arthralgias. Negative for joint swelling.  Skin: Negative for rash.  Neurological: Negative for headaches.  Hematological: Does not bruise/bleed easily.  Psychiatric/Behavioral: Negative for dysphoric mood. The patient is not nervous/anxious.    Past Medical History  Diagnosis Date  . Hyperlipemia   . Glaucoma(365)     Dr.Hecker  . History of colon cancer 1998    Dr. Kendrick [place]  . Diabetes mellitus, type II   . Renal insufficiency   . Anemia   . UTI (lower  urinary tract infection) 10/4-11/2010     Family History  Problem Relation Age of Onset  . Heart attack Father 49  . Stroke Father 25  . Diabetes Father   . Hypertension Father   . Breast cancer Mother   . Diabetes Sister   . Breast cancer Sister   . COPD Neg Hx   . Asthma Neg Hx      History   Social History  . Marital Status: Widowed    Spouse Name: N/A    Number of Children: 4  . Years of Education: N/A   Occupational History  . retired     Neurosurgeon   Social History Main Topics  . Smoking status: Former Smoker -- 2 years    Types: Cigarettes    Quit date: 06/04/1973  . Smokeless tobacco: Never Used     Comment: Smoked for 5 years 1965-1970 up to 1/2 pp week  . Alcohol Use: Yes     Comment:  Minimally  . Drug Use: No  . Sexually Active: Not on file   Other Topics Concern  . Not on file   Social History Narrative  . No narrative on file     Allergies  Allergen Reactions  . Pravastatin Sodium     REACTION: anemia as per Dr Dan Humphreys, [facility]     Outpatient Prescriptions Prior to Visit  Medication Sig Dispense Refill  . aspirin 81 MG tablet Take 81 mg by mouth daily.        Marland Kitchen atorvastatin (LIPITOR) 40 MG tablet TAKE 1 TABLET  BY MOUTH EVERY AT BEDTIME  90 tablet  0  . B-D ULTRAFINE III SHORT PEN 31G X 8 MM MISC USE AS DIRECTED WITH INSULIN  100 each  3  . calcium carbonate (OS-CAL) 600 MG TABS Take 600 mg by mouth daily.        . carvedilol (COREG) 25 MG tablet TAKE 1 TABLET BY MOUTH TWICE DAILY  180 tablet  2  . Cholecalciferol (VITAMIN D-3) 5000 UNITS TABS Take 1 tablet by mouth daily.      Marland Kitchen CINNAMON PO Take by mouth daily.      . folic acid (FOLVITE) 400 MCG tablet Take 400 mcg by mouth daily.        . furosemide (LASIX) 40 MG tablet TAKE 1 TABLET BY MOUTH EVERY DAY  30 tablet  5  . glucose blood (FREESTYLE LITE) test strip Use as instructed  100 each  2  . insulin glargine (LANTUS SOLOSTAR) 100 UNIT/ML injection Inject 45 Units into the skin daily.  RX'ed by Dr.Balan      . insulin lispro (HUMALOG KWIKPEN) 100 UNIT/ML injection Inject into the skin 3 (three) times daily before meals. 7 units three times daily with each meal, Rx'ed by Dr.Balan      . Misc Natural Products (OSTEO BI-FLEX JOINT SHIELD) TABS Take by mouth. 2 by mouth daily      . Omega-3 Fatty Acids (FISH OIL) 1000 MG CAPS Take 1,000 mg by mouth daily.        . potassium chloride SA (K-DUR,KLOR-CON) 20 MEQ tablet       . ramipril (ALTACE) 10 MG capsule TAKE ONE CAPSULE BY MOUTH DAILY  90 capsule  1  . vitamin E 400 UNIT capsule Take 400 Units by mouth daily.        . [DISCONTINUED] predniSONE (DELTASONE) 1 MG tablet TAKE 1 TABLET BY MOUTH EVERY DAY  30 tablet  0   Last reviewed on 06/09/2012  9:23 AM by Pecola Lawless, MD      Objective:   Physical Exam Filed Vitals:   07/04/12 1015  BP: 160/80  Pulse: 66  Temp: 97.7 F (36.5 C)   Gen: Pleasant, well-nourished, in no distress,  normal affect  ENT: No lesions,  mouth clear,  oropharynx clear, no postnasal drip  Neck: No JVD, no TMG, no carotid bruits  Lungs: No use of accessory muscles, no dullness to percussion, clear without rales or rhonchi  Cardiovascular: RRR, heart sounds normal, no murmur or gallops, no peripheral edema  Musculoskeletal: No deformities, no cyanosis or clubbing, pectus very mild on exam  Neuro: alert, non focal  Skin: Warm, no lesions or rashes    06/11/12 --  CHEST - 2 VIEW  Comparison: 03/08/2011  Findings: Mild to moderate pectus excavatum deformity. Lateral view  degraded by patient arm position.  Midline trachea. Mild cardiomegaly, accentuated by the pectus  deformity. No pleural effusion or pneumothorax. No congestive  failure.  Clear lungs.  IMPRESSION:  Cardiomegaly without congestive failure.   09/25/11 TTE --  Study Conclusions - Left ventricle: The cavity size was normal. Wall thickness was normal. Systolic function was normal. The estimated ejection fraction was  in the range of 60% to 65%. Wall motion was normal; there were no regional wall motion abnormalities. Features are consistent with a pseudonormal left ventricular filling pattern, with concomitant abnormal relaxation and increased filling pressure (grade 2 diastolic dysfunction). - Mitral valve: Calcified annulus. Mild regurgitation.       Assessment &  Plan:  Dyspnea Etiology unclear - I suspect multifactorial with wt gain, DM, deconditioning with her reassuring cards eval and absence of other pulm sx. The sx seemed to correspond with the pred for the last year (? Whether hyperglycemia contributed).  - stay of the pred if possible and manage DM  - full PFT  - walking oximetry - rov next available to review

## 2012-07-04 NOTE — Assessment & Plan Note (Addendum)
Etiology unclear - I suspect multifactorial with wt gain, DM, deconditioning with her reassuring cards eval and absence of other pulm sx. The sx seemed to correspond with the pred for the last year (? Whether hyperglycemia contributed).  - stay of the pred if possible and manage DM  - full PFT  - walking oximetry - rov next available to review

## 2012-07-04 NOTE — Patient Instructions (Addendum)
Walking oximetry today We will perform full pulmonary function testing at your next office visit Follow with Dr Delton Coombes next available with full PFT

## 2012-07-18 ENCOUNTER — Other Ambulatory Visit: Payer: Self-pay | Admitting: Internal Medicine

## 2012-07-21 ENCOUNTER — Telehealth: Payer: Self-pay | Admitting: Internal Medicine

## 2012-07-21 DIAGNOSIS — E785 Hyperlipidemia, unspecified: Secondary | ICD-10-CM

## 2012-07-21 DIAGNOSIS — T887XXA Unspecified adverse effect of drug or medicament, initial encounter: Secondary | ICD-10-CM

## 2012-07-21 MED ORDER — CARVEDILOL 25 MG PO TABS: ORAL_TABLET | ORAL | Status: DC

## 2012-07-21 NOTE — Telephone Encounter (Signed)
Last OV 06-09-12, last filled 04-09-12 #90, last lipid 02-14-11

## 2012-07-21 NOTE — Telephone Encounter (Signed)
Refill 30 days of each.Please  schedule fasting Labs : BMET,Lipids, hepatic panel, , TSH. Codes: 272.4,995.20,995.20

## 2012-07-21 NOTE — Telephone Encounter (Signed)
Refills x 2  1-Carvedolol 25MG  1po bid #180 no last fill date--last wrt 5.16.13  2-Atorvastatin 40mg  1 po qhs #90 also no last fill date -- last wrt 11.6.13

## 2012-07-22 MED ORDER — CARVEDILOL 25 MG PO TABS: ORAL_TABLET | ORAL | Status: DC

## 2012-07-22 MED ORDER — ATORVASTATIN CALCIUM 40 MG PO TABS: ORAL_TABLET | ORAL | Status: DC

## 2012-07-22 NOTE — Telephone Encounter (Signed)
Rx's sent, future orders placed

## 2012-08-08 ENCOUNTER — Ambulatory Visit: Payer: Medicare Other | Admitting: Emergency Medicine

## 2012-08-20 ENCOUNTER — Other Ambulatory Visit: Payer: Self-pay | Admitting: Internal Medicine

## 2012-08-20 NOTE — Telephone Encounter (Signed)
Future labs placed. 

## 2012-08-28 ENCOUNTER — Telehealth: Payer: Self-pay | Admitting: Internal Medicine

## 2012-08-28 NOTE — Telephone Encounter (Signed)
REFILLS NEEDED ON RAMIPAIL 10 MG CAPSULE#90  SIG: TAKE ONE CAPSULE BY MOUTH EVERY DAY. LAST FILLED : 12.23.14      AUTH REFILLS:0

## 2012-08-29 MED ORDER — RAMIPRIL 10 MG PO CAPS: ORAL_CAPSULE | ORAL | Status: DC

## 2012-08-29 NOTE — Telephone Encounter (Signed)
Sent electronically 

## 2012-09-02 ENCOUNTER — Ambulatory Visit (INDEPENDENT_AMBULATORY_CARE_PROVIDER_SITE_OTHER): Payer: Medicare Other | Admitting: Emergency Medicine

## 2012-09-02 ENCOUNTER — Encounter: Payer: Self-pay | Admitting: Emergency Medicine

## 2012-09-02 VITALS — BP 118/62 | HR 78 | Temp 99.4°F | Ht 60.0 in | Wt 146.0 lb

## 2012-09-02 DIAGNOSIS — R0600 Dyspnea, unspecified: Secondary | ICD-10-CM

## 2012-09-02 DIAGNOSIS — R0609 Other forms of dyspnea: Secondary | ICD-10-CM

## 2012-09-02 LAB — PULMONARY FUNCTION TEST

## 2012-09-02 NOTE — Progress Notes (Signed)
PFT done today. 

## 2012-09-02 NOTE — Progress Notes (Signed)
Subjective:    Patient ID: Taylor Castaneda, female    DOB: 12/29/1932, 79 y.o.   MRN: [id number]  HPI 79 yo woman, former tobacco (minimal exposure), hx of DM, renal insuff, HTN, colon CA, PMR on chronic steroids - was started on 10mg  about a year ago, weaning and just stopped this month (pain now better). She has been having exertional dyspnea for about a year. This prompted cardiac workup (reassuring) and a CXR. She does not have CP, cough or wheeze with exertion. She may hear some wheeze at night when supine but not assos w dyspnea. She has gained about 20 lbs over the last year. She has felt "bad" for the last few months, not just SOB, no energy. Hgb 12.5 in 1/14.    CXR 06/11/12 with pectus excavatum, cardiomegaly, no other abnormalities TTE 09/25/11 -- evidence for diastolic dysfxn Exercise nuclear scan 09/27/11 -- normal LV fxn, no evidence ischemia  ROV 09/02/12 -- f/u for exertional SOB. As above has had a reassuring cards eval. PFT today >> normal spiro, suggestive of restriction based on the F-V curve, mild restriction on volumes. Low DLCO that corrects for Va. She reports no real clinical changes since last time. She has lost 4 lbs over the last 3 weeks. Breathing may be slightly better.   PULMONARY FUNCTON TEST 09/02/2012  FVC 1.74  FEV1 1.37  FEV1/FVC 78.7  FVC  % Predicted 80  FEV % Predicted 94  FeF 25-75 1.33  FeF 25-75 % Predicted 1.81    Review of Systems  Constitutional: Positive for unexpected weight change. Negative for fever.  HENT: Negative for ear pain, nosebleeds, congestion, sore throat, rhinorrhea, sneezing, trouble swallowing, dental problem, postnasal drip and sinus pressure.   Eyes: Negative for redness and itching.  Respiratory: Positive for shortness of breath. Negative for cough, chest tightness and wheezing.   Cardiovascular: Negative for palpitations and leg swelling.  Gastrointestinal: Negative for nausea and vomiting.  Genitourinary: Negative for dysuria.   Musculoskeletal: Negative for joint swelling and arthralgias.  Skin: Negative for rash.  Neurological: Negative for headaches.  Hematological: Does not bruise/bleed easily.  Psychiatric/Behavioral: Negative for dysphoric mood. The patient is not nervous/anxious.       Objective:   Physical Exam Filed Vitals:   09/02/12 1401  BP: 118/62  Pulse: 78  Temp: 99.4 F (37.4 C)   Gen: Pleasant, well-nourished, in no distress,  normal affect  ENT: No lesions,  mouth clear,  oropharynx clear, no postnasal drip  Neck: No JVD, no TMG, no carotid bruits  Lungs: No use of accessory muscles, no dullness to percussion, clear without rales or rhonchi  Cardiovascular: RRR, heart sounds normal, no murmur or gallops, no peripheral edema  Musculoskeletal: No deformities, no cyanosis or clubbing, pectus very mild on exam  Neuro: alert, non focal  Skin: Warm, no lesions or rashes    06/11/12 --  CHEST - 2 VIEW  Comparison: 03/08/2011  Findings: Mild to moderate pectus excavatum deformity. Lateral view  degraded by patient arm position.  Midline trachea. Mild cardiomegaly, accentuated by the pectus  deformity. No pleural effusion or pneumothorax. No congestive  failure.  Clear lungs.  IMPRESSION:  Cardiomegaly without congestive failure.   09/25/11 TTE --  Study Conclusions - Left ventricle: The cavity size was normal. Wall thickness was normal. Systolic function was normal. The estimated ejection fraction was in the range of 60% to 65%. Wall motion was normal; there were no regional wall motion abnormalities. Features are consistent  with a pseudonormal left ventricular filling pattern, with concomitant abnormal relaxation and increased filling pressure (grade 2 diastolic dysfunction). - Mitral valve: Calcified annulus. Mild regurgitation.      Assessment & Plan:  Dyspnea PFT 4/1 are reassuring, show some mild restriction. At this point I believe that her dyspnea is largely due  to deconditioning and wt gain. Highest yield move is to increase her exercise regimen and work on conditioning. If she still has dyspnea at that time then would recommend CPST.

## 2012-09-02 NOTE — Patient Instructions (Addendum)
Your breathing tests today are reassuring.  Continue to work on your diet and exercise to build up your stamina.  Follow with Dr Delton Coombes in 6 months or sooner if you have any problems

## 2012-09-02 NOTE — Assessment & Plan Note (Signed)
PFT 4/1 are reassuring, show some mild restriction. At this point I believe that her dyspnea is largely due to deconditioning and wt gain. Highest yield move is to increase her exercise regimen and work on conditioning. If she still has dyspnea at that time then would recommend CPST.

## 2012-09-15 ENCOUNTER — Encounter: Payer: Self-pay | Admitting: Emergency Medicine

## 2012-09-22 ENCOUNTER — Other Ambulatory Visit: Payer: Self-pay | Admitting: Internal Medicine

## 2012-10-08 ENCOUNTER — Other Ambulatory Visit (INDEPENDENT_AMBULATORY_CARE_PROVIDER_SITE_OTHER): Payer: Medicare Other

## 2012-10-08 DIAGNOSIS — E785 Hyperlipidemia, unspecified: Secondary | ICD-10-CM

## 2012-10-08 DIAGNOSIS — T887XXA Unspecified adverse effect of drug or medicament, initial encounter: Secondary | ICD-10-CM

## 2012-10-08 LAB — LIPID PANEL
Cholesterol: 133 mg/dL (ref 0–200)
HDL: 34.3 mg/dL — ABNORMAL LOW (ref >39.00)
VLDL: 32.6 mg/dL (ref 0.0–40.0)

## 2012-10-08 LAB — HEPATIC FUNCTION PANEL
ALT: 28 U/L (ref 0–35)
AST: 25 U/L (ref 0–37)
Bilirubin, Direct: 0 mg/dL (ref 0.0–0.3)
Total Protein: 6.8 g/dL (ref 6.0–8.3)

## 2012-10-08 LAB — BASIC METABOLIC PANEL
GFR: 29.66 mL/min — ABNORMAL LOW (ref >60.00)
Potassium: 4.1 mEq/L (ref 3.5–5.1)
Sodium: 139 mEq/L (ref 135–145)

## 2012-10-29 ENCOUNTER — Encounter: Payer: Self-pay | Admitting: Internal Medicine

## 2012-10-29 ENCOUNTER — Ambulatory Visit (INDEPENDENT_AMBULATORY_CARE_PROVIDER_SITE_OTHER): Payer: Medicare Other | Admitting: Internal Medicine

## 2012-10-29 VITALS — BP 142/70 | HR 71 | Wt 149.0 lb

## 2012-10-29 DIAGNOSIS — E785 Hyperlipidemia, unspecified: Secondary | ICD-10-CM

## 2012-10-29 DIAGNOSIS — N259 Disorder resulting from impaired renal tubular function, unspecified: Secondary | ICD-10-CM

## 2012-10-29 DIAGNOSIS — M353 Polymyalgia rheumatica: Secondary | ICD-10-CM

## 2012-10-29 DIAGNOSIS — IMO0001 Reserved for inherently not codable concepts without codable children: Secondary | ICD-10-CM

## 2012-10-29 DIAGNOSIS — I10 Essential (primary) hypertension: Secondary | ICD-10-CM

## 2012-10-29 MED ORDER — ATORVASTATIN CALCIUM 40 MG PO TABS: ORAL_TABLET | ORAL | Status: DC

## 2012-10-29 NOTE — Assessment & Plan Note (Addendum)
Atorvastatin 40 mg has gotten her LDL to goal of less than 70. Liver function is normal.  Restrict HFCS to < 30 grams/day, preferably zero She denies myalgias but does have intermittent foot cramps. This is most likely related to pes planus. Arch supports would be recommended. Also exercises will be encouraged at bedtime.

## 2012-10-29 NOTE — Progress Notes (Signed)
  Subjective:    Patient ID: Taylor Castaneda, female    DOB: Jul 24, 1932, 79 y.o.   MRN: [id number]  HPI She returns for followup of her lipids, polymyalgia rheumatica and renal insufficiency. She sees Dr.Balan for diabetes.  She was on atorvastatin 40 mg daily until approximately 10/05/12. Labs done 5/7 revealed an LDL of 66, HDL of 34.3, and triglycerides of 163. Her prior triglycerides were 92 suggesting some dietary influence  Her creatinine has risen from 1.5-1.8. Her GFR has dropped to 29.66.      Review of Systems She denies myalgias at this time; she does have cramps in her feet at night. Her polymyalgia rheumatica is asymptomatic; her steroids were discontinued after slow wean over 18 months in January of this year.   Blood pressure average is 130/74 at home. She does have minor edema at times. Her exertional dyspnea is improving. She denies paroxysmal nocturnal dyspnea, chest pain, palpitations, or claudication. She has no abdominal pain, unexplained weight loss, melena, or rectal bleeding. A1c was 7.3% in 01/14; FBS average 115.     Objective:   Physical Exam Gen.:  well-nourished in appearance. Alert, appropriate and cooperative throughout exam.Appears younger than stated age   Eyes: No corneal or conjunctival inflammation noted. Neck: No deformities, masses, or tenderness noted.  Thyroid normal. Lungs: Normal respiratory effort; chest expands symmetrically. Lungs are clear to auscultation without rales, wheezes, or increased work of breathing. Heart: Normal rate and rhythm. Normal S1 and S2. No gallop, click, or rub. Grade 1/2 over 6 systolic murmur. Abdomen: Bowel sounds normal; abdomen soft and nontender. No masses, organomegaly or hernias noted.                               Musculoskeletal/extremities: No deformity or scoliosis noted of  the thoracic or lumbar spine.  Accentuated curvature of upper thoracic  Spine. There is some asymmetry of the posterior thoracic  musculature suggesting occult scoliosis. No clubbing, cyanosis, edema, or significant extremity  deformity noted. Tone & strength  Normal. Joints normal . Nail health good.Pes planus. Able to lie down & sit up w/o help.  Vascular: Carotid, radial artery, dorsalis pedis and  posterior tibial pulses are full and equal. No bruits present. Neurologic: Alert and oriented x3. Deep tendon reflexes symmetrical and normal.   Light touch normal over feet.  Skin: Intact without suspicious lesions or rashes. Lymph: No cervical, axillary lymphadenopathy present. Psych: Mood and affect are normal. Normally interactive                                                                                        Assessment & Plan:  See Current Assessment & Plan in Problem List under specific Diagnosis

## 2012-10-29 NOTE — Assessment & Plan Note (Signed)
To protect the kidneys it  is important to control your blood pressure and sugar. You should also stay well hydrated. Drink to thirst, up to 32 ounces of fluids per day.  Recheck BMET in 6 weeks @ Dr Willeen Cass office in 7/14

## 2012-10-29 NOTE — Assessment & Plan Note (Signed)
Because of age and comorbidities; presumed A1c goal is less than 8% unless goal is redefined by [facility]

## 2012-10-29 NOTE — Assessment & Plan Note (Signed)
Minimal Blood Pressure Goal= AVERAGE < 140/90;  Ideal is an AVERAGE < 135/85. This AVERAGE should be calculated from @ least 5-7 BP readings taken @ different times of day on different days of week. You should not respond to isolated BP readings , but rather the AVERAGE for that week .Please bring your  blood pressure cuff to office visits to verify that it is reliable.It  can also be checked against the blood pressure device at the pharmacy. Finger or wrist cuffs are not dependable; an arm cuff is.Report if BP averages > 140/90

## 2012-10-29 NOTE — Assessment & Plan Note (Signed)
Bone mineral density should be repeated in September 2015. Parenteral therapy should be considered if she becomes osteoporotic. Bisphosphonates are not recommended at this time because of her age and polypharmacy. The risk of steroids for PMR has been resolved

## 2012-10-29 NOTE — Patient Instructions (Addendum)
Wear arch supports in all shoes. Do the tennis ball  exercises for the arch ; 20 reps twice a day. You can soak  foot in warm Epsom salts before going to bed.Please perform isometric exercises before going to bed. Sit on side of the bed and raise up on toes to a count of 5. Then put pressure on the heels to a count of 5. Repeat this process 10 times. This will improve blood flow to the calves & help prevent cramps. BMET @ Dr Willeen Cass office in 7/14. Try to eliminate high fructose corn syrup from your diet or @ least consume less than 30 g/day from processed foods and drinks

## 2012-11-02 LAB — HM MAMMOGRAPHY

## 2013-01-16 ENCOUNTER — Encounter: Payer: Self-pay | Admitting: Gastroenterology

## 2013-01-18 ENCOUNTER — Other Ambulatory Visit: Payer: Self-pay | Admitting: Internal Medicine

## 2013-02-11 ENCOUNTER — Other Ambulatory Visit: Payer: Self-pay | Admitting: Internal Medicine

## 2013-02-22 ENCOUNTER — Other Ambulatory Visit: Payer: Self-pay | Admitting: Internal Medicine

## 2013-02-25 ENCOUNTER — Ambulatory Visit (INDEPENDENT_AMBULATORY_CARE_PROVIDER_SITE_OTHER): Payer: Medicare Other | Admitting: Emergency Medicine

## 2013-02-25 ENCOUNTER — Encounter: Payer: Self-pay | Admitting: Emergency Medicine

## 2013-02-25 VITALS — BP 118/60 | HR 61 | Temp 98.4°F | Ht 60.0 in | Wt 146.4 lb

## 2013-02-25 DIAGNOSIS — R0600 Dyspnea, unspecified: Secondary | ICD-10-CM

## 2013-02-25 DIAGNOSIS — R0609 Other forms of dyspnea: Secondary | ICD-10-CM

## 2013-02-25 NOTE — Telephone Encounter (Signed)
Med filled.  

## 2013-02-25 NOTE — Assessment & Plan Note (Signed)
With w/u that supports restriction and deconditioning. Discussed this with her today. We will defer CPST, continue to work on exercise and conditioning.

## 2013-02-25 NOTE — Progress Notes (Signed)
Subjective:    Patient ID: Taylor Castaneda, female    DOB: 12/13/1932, 79 y.o.   MRN: [id number]  HPI 79 yo woman, former tobacco (minimal exposure), hx of DM, renal insuff, HTN, colon CA, PMR on chronic steroids - was started on 10mg  about a year ago, weaning and just stopped this month (pain now better). She has been having exertional dyspnea for about a year. This prompted cardiac workup (reassuring) and a CXR. She does not have CP, cough or wheeze with exertion. She may hear some wheeze at night when supine but not assos w dyspnea. She has gained about 20 lbs over the last year. She has felt "bad" for the last few months, not just SOB, no energy. Hgb 12.5 in 1/14.   CXR 06/11/12 with pectus excavatum, cardiomegaly, no other abnormalities TTE 09/25/11 -- evidence for diastolic dysfxn Exercise nuclear scan 09/27/11 -- normal LV fxn, no evidence ischemia  ROV 09/02/12 -- f/u for exertional SOB. As above has had a reassuring cards eval. PFT today >> normal spiro, suggestive of restriction based on the F-V curve, mild restriction on volumes. Low DLCO that corrects for Va. She reports no real clinical changes since last time. She has lost 4 lbs over the last 3 weeks. Breathing may be slightly better.   ROV 02/25/13 -- f/u for SOB due to restriction. Last time we agreed to work on conditioning and wt loss. She hasn't really increased her exercise pattern because of leg pain w exertion, diabetic neuropathy. No flares of any kind. We discussed possible CPST, but I don't think this high yield since we suspect deconditioning.    PULMONARY FUNCTON TEST 09/02/2012  FVC 1.74  FEV1 1.37  FEV1/FVC 78.7  FVC  % Predicted 80  FEV % Predicted 94  FeF 25-75 1.33  FeF 25-75 % Predicted 1.81    Review of Systems  Constitutional: Negative for fever and unexpected weight change.  HENT: Negative for ear pain, nosebleeds, congestion, sore throat, rhinorrhea, sneezing, trouble swallowing, dental problem, postnasal drip  and sinus pressure.   Eyes: Negative for redness and itching.  Respiratory: Positive for shortness of breath. Negative for cough, chest tightness and wheezing.   Cardiovascular: Negative for palpitations and leg swelling.  Gastrointestinal: Negative for nausea and vomiting.  Genitourinary: Negative for dysuria.  Musculoskeletal: Positive for myalgias. Negative for joint swelling and arthralgias.  Skin: Negative for rash.  Neurological: Negative for headaches.  Hematological: Does not bruise/bleed easily.  Psychiatric/Behavioral: Negative for dysphoric mood. The patient is not nervous/anxious.       Objective:   Physical Exam Filed Vitals:   02/25/13 1046  BP: 118/60  Pulse: 61  Temp: 98.4 F (36.9 C)  TempSrc: Oral  Height: 5' (1.524 m)  Weight: 146 lb 6.4 oz (66.407 kg)  SpO2: 99%   Gen: Pleasant, well-nourished, in no distress,  normal affect  ENT: No lesions,  mouth clear,  oropharynx clear, no postnasal drip  Neck: No JVD, no TMG, no carotid bruits  Lungs: No use of accessory muscles, no dullness to percussion, clear without rales or rhonchi  Cardiovascular: RRR, heart sounds normal, no murmur or gallops, no peripheral edema  Musculoskeletal: No deformities, no cyanosis or clubbing, pectus very mild on exam  Neuro: alert, non focal  Skin: Warm, no lesions or rashes    06/11/12 --  CHEST - 2 VIEW  Comparison: 03/08/2011  Findings: Mild to moderate pectus excavatum deformity. Lateral view  degraded by patient arm position.  Midline  trachea. Mild cardiomegaly, accentuated by the pectus  deformity. No pleural effusion or pneumothorax. No congestive  failure.  Clear lungs.  IMPRESSION:  Cardiomegaly without congestive failure.   09/25/11 TTE --  Study Conclusions - Left ventricle: The cavity size was normal. Wall thickness was normal. Systolic function was normal. The estimated ejection fraction was in the range of 60% to 65%. Wall motion was normal; there  were no regional wall motion abnormalities. Features are consistent with a pseudonormal left ventricular filling pattern, with concomitant abnormal relaxation and increased filling pressure (grade 2 diastolic dysfunction). - Mitral valve: Calcified annulus. Mild regurgitation.      Assessment & Plan:  Dyspnea With w/u that supports restriction and deconditioning. Discussed this with her today. We will defer CPST, continue to work on exercise and conditioning.

## 2013-02-25 NOTE — Patient Instructions (Addendum)
We will continue to work on building up your exercise tolerance - keep going to the [facility] and exercising.  Follow with Dr Delton Coombes in 12 months or sooner if you have any problems

## 2013-03-18 ENCOUNTER — Ambulatory Visit (INDEPENDENT_AMBULATORY_CARE_PROVIDER_SITE_OTHER): Payer: Medicare Other

## 2013-03-18 DIAGNOSIS — Z23 Encounter for immunization: Secondary | ICD-10-CM

## 2013-04-15 ENCOUNTER — Encounter: Payer: Self-pay | Admitting: Gastroenterology

## 2013-05-12 ENCOUNTER — Other Ambulatory Visit: Payer: Self-pay | Admitting: *Deleted

## 2013-05-12 MED ORDER — FUROSEMIDE 40 MG PO TABS: ORAL_TABLET | ORAL | Status: DC

## 2013-05-12 NOTE — Telephone Encounter (Signed)
Furosemide refilled per protocol 

## 2013-06-01 ENCOUNTER — Other Ambulatory Visit: Payer: Self-pay | Admitting: *Deleted

## 2013-06-01 MED ORDER — RAMIPRIL 10 MG PO CAPS: ORAL_CAPSULE | ORAL | Status: DC

## 2013-06-04 HISTORY — PX: COLONOSCOPY: SHX174

## 2013-06-16 ENCOUNTER — Ambulatory Visit (AMBULATORY_SURGERY_CENTER): Payer: Self-pay

## 2013-06-16 VITALS — Ht 60.0 in | Wt 147.8 lb

## 2013-06-16 DIAGNOSIS — Z860100 Personal history of colon polyps, unspecified: Secondary | ICD-10-CM

## 2013-06-16 DIAGNOSIS — Z85038 Personal history of other malignant neoplasm of large intestine: Secondary | ICD-10-CM

## 2013-06-16 MED ORDER — MOVIPREP 100 G PO SOLR: ORAL | Status: DC

## 2013-06-23 ENCOUNTER — Encounter: Payer: Self-pay | Admitting: Gastroenterology

## 2013-06-30 ENCOUNTER — Encounter: Payer: Self-pay | Admitting: Gastroenterology

## 2013-06-30 ENCOUNTER — Ambulatory Visit (AMBULATORY_SURGERY_CENTER): Payer: Medicare Other | Admitting: Gastroenterology

## 2013-06-30 VITALS — BP 118/52 | HR 60 | Temp 97.4°F | Resp 12 | Ht 60.0 in | Wt 147.0 lb

## 2013-06-30 DIAGNOSIS — Z860100 Personal history of colon polyps, unspecified: Secondary | ICD-10-CM

## 2013-06-30 DIAGNOSIS — D126 Benign neoplasm of colon, unspecified: Secondary | ICD-10-CM

## 2013-06-30 DIAGNOSIS — Z85038 Personal history of other malignant neoplasm of large intestine: Secondary | ICD-10-CM

## 2013-06-30 LAB — GLUCOSE, CAPILLARY
Glucose-Capillary: 163 mg/dL — ABNORMAL HIGH (ref 70–99)
Glucose-Capillary: 161 mg/dL — ABNORMAL HIGH (ref 70–99)

## 2013-06-30 MED ORDER — SODIUM CHLORIDE 0.9 % IV SOLN: 500.0000 mL | INTRAVENOUS | Status: DC

## 2013-06-30 NOTE — Progress Notes (Signed)
Called to room to assist during endoscopic procedure.  Patient ID and intended procedure confirmed with present staff. Received instructions for my participation in the procedure from the performing physician.  

## 2013-06-30 NOTE — Patient Instructions (Signed)

## 2013-06-30 NOTE — Op Note (Signed)
[place]  [address]. [place], [address]   COLONOSCOPY PROCEDURE REPORT  PATIENT: Taylor Castaneda, Taylor Castaneda  MR#: [id number] BIRTHDATE: 01-29-1933 , 81  yrs. old GENDER: Female ENDOSCOPIST: Ladene Artist, MD, [facility] PROCEDURE DATE:  06/30/2013 PROCEDURE:   Colonoscopy with snare polypectomy First Screening Colonoscopy - Avg.  risk and is 50 yrs.  old or older - No.  Prior Negative Screening - Now for repeat screening. N/A  History of Adenoma - Now for follow-up colonoscopy & has been > or = to 3 yrs.  Yes hx of adenoma.  Has been 3 or more years since last colonoscopy.  Polyps Removed Today? Yes. ASA CLASS:   Class II INDICATIONS:High risk patient with personal history of colon cancer and Patient's personal history of adenomatous colon polyps. MEDICATIONS: MAC sedation, administered by CRNA and propofol (Diprivan) 250mg  IV DESCRIPTION OF PROCEDURE:   After the risks benefits and alternatives of the procedure were thoroughly explained, informed consent was obtained.  A digital rectal exam revealed internal hemorrhoids.   The LB [id number]  endoscope was introduced through the anus and advanced to the terminal ileum which was intubated for a short distance. No adverse events experienced. The quality of the prep was excellent, using MoviPrep  The instrument was then slowly withdrawn as the colon was fully examined.  COLON FINDINGS: A sessile polyp measuring 5 mm in size was found in the sigmoid colon.  A polypectomy was performed with a cold snare. The resection was complete and the polyp tissue was completely retrieved.   There was evidence of a prior ileocolonic surgical anastomosis.   The colon was otherwise normal.  There was no diverticulosis, inflammation, polyps or cancers unless previously stated.  Retroflexed views revealed small internal hemorrhoids. The time to cecum=2 minutes 29 seconds.  Withdrawal time=8 minutes 12 seconds.  The scope was withdrawn  and the procedure completed. COMPLICATIONS: There were no complications.  ENDOSCOPIC IMPRESSION: 1.   Sessile polyp measuring 5 mm in the sigmoid colon; polypectomy performed with a cold snare 2.   Prior ileocolonic surgical anastomosis 3.   Small internal hemorrhoids  RECOMMENDATIONS: 1.  Await pathology results 2.  Given your age, you will not need another colonoscopy for colon cancer screening or polyp surveillance.  These types of tests usually stop around the age 10.   eSigned:  Ladene Artist, MD, [facility] 06/30/2013 8:30 AM

## 2013-07-01 ENCOUNTER — Telehealth: Payer: Self-pay | Admitting: *Deleted

## 2013-07-01 NOTE — Telephone Encounter (Signed)
  Follow up Call-  Call back number 06/30/2013  Post procedure Call Back phone  # [phone]  Permission to leave phone message Yes     Patient questions:  Do you have a fever, pain , or abdominal swelling? no Pain Score  0 *  Have you tolerated food without any problems? yes  Have you been able to return to your normal activities? yes  Do you have any questions about your discharge instructions: Diet   no Medications  no Follow up visit  no  Do you have questions or concerns about your Care? no  Actions: * If pain score is 4 or above: No action needed, pain <4.

## 2013-07-08 ENCOUNTER — Encounter: Payer: Self-pay | Admitting: Gastroenterology

## 2013-07-20 ENCOUNTER — Other Ambulatory Visit: Payer: Self-pay | Admitting: Internal Medicine

## 2013-07-22 NOTE — Telephone Encounter (Signed)
Rx sent to the pharmacy by e-script.  Pt needs office visit for BP,Diabetes follow-up.//AB/CMA

## 2013-09-07 ENCOUNTER — Other Ambulatory Visit: Payer: Self-pay

## 2013-09-07 MED ORDER — FUROSEMIDE 40 MG PO TABS: ORAL_TABLET | ORAL | Status: DC

## 2013-10-07 ENCOUNTER — Other Ambulatory Visit: Payer: Self-pay | Admitting: Internal Medicine

## 2013-10-08 ENCOUNTER — Other Ambulatory Visit: Payer: Self-pay

## 2013-10-08 MED ORDER — CARVEDILOL 25 MG PO TABS: ORAL_TABLET | ORAL | Status: DC

## 2013-10-15 ENCOUNTER — Other Ambulatory Visit: Payer: Self-pay | Admitting: *Deleted

## 2013-10-15 DIAGNOSIS — M79609 Pain in unspecified limb: Secondary | ICD-10-CM

## 2013-10-23 ENCOUNTER — Other Ambulatory Visit: Payer: Self-pay

## 2013-10-23 DIAGNOSIS — E785 Hyperlipidemia, unspecified: Secondary | ICD-10-CM

## 2013-10-23 MED ORDER — ATORVASTATIN CALCIUM 40 MG PO TABS: ORAL_TABLET | ORAL | Status: DC

## 2013-11-02 HISTORY — PX: OTHER SURGICAL HISTORY: SHX169

## 2013-11-10 ENCOUNTER — Encounter: Payer: Self-pay | Admitting: Vascular Surgery

## 2013-11-11 ENCOUNTER — Encounter: Payer: Self-pay | Admitting: Vascular Surgery

## 2013-11-11 ENCOUNTER — Ambulatory Visit (HOSPITAL_COMMUNITY)
Admission: RE | Admit: 2013-11-11 | Discharge: 2013-11-11 | Disposition: A | Discharge status: Home / Self Care | Payer: Medicare Other | Source: Ambulatory Visit | Attending: Vascular Surgery | Admitting: Vascular Surgery

## 2013-11-11 ENCOUNTER — Ambulatory Visit (INDEPENDENT_AMBULATORY_CARE_PROVIDER_SITE_OTHER): Payer: Medicare Other | Admitting: Vascular Surgery

## 2013-11-11 VITALS — BP 174/64 | HR 63 | Ht 60.0 in | Wt 147.7 lb

## 2013-11-11 DIAGNOSIS — M79609 Pain in unspecified limb: Secondary | ICD-10-CM

## 2013-11-11 DIAGNOSIS — I739 Peripheral vascular disease, unspecified: Secondary | ICD-10-CM | POA: Insufficient documentation

## 2013-11-11 NOTE — Assessment & Plan Note (Signed)
I do not think her pain in her anterior legs is related to peripheral vascular disease. Her arterial Doppler study is normal and she has palpable pedal pulses. Likewise the pain is in the anterior part of her legs and sounds more musculoskeletal. I would suspect her to have calf claudication it is related to peripheral vascular disease. Likewise on exam I do not see evidence of significant chronic venous insufficiency. Have reassured her that I do not think this is anything serious and that she has excellent arterial flow bilaterally. I'll be happy to see her back at any time if any new vascular issues arise.

## 2013-11-11 NOTE — Progress Notes (Signed)
Patient ID: Taylor Castaneda, female   DOB: 1932/06/22, 80 y.o.   MRN: [id number]  Reason for Consult: New Evaluation  Claudication Referred by Hendricks Limes, MD  Subjective:     HPI:  [name] is a 80 y.o. female who for 4 months has been having some pain in her anterior legs when she walks. The pain is brought on by ambulation and takes some time to go away a once she has stopped. There were no other aggravating or alleviating factors. The pain is equal in both legs. It involved her anterior leg and not her calf muscles. She denies thigh or hip claudication. She denies any history of rest pain or history of nonhealing ulcers. She does occasionally get cramps at night.  Her risk factors for peripheral vascular disease include diabetes, hypertension, and hypercholesterolemia. In addition she has a family history of premature cardiovascular disease.  Past Medical History  Diagnosis Date  . Hyperlipemia   . Glaucoma     Dr.Hecker  . History of colon cancer 1998    Dr. Lennie Hummer  . Diabetes mellitus, type II   . Renal insufficiency   . Anemia   . UTI (lower urinary tract infection) 10/4-11/2010   Family History  Problem Relation Age of Onset  . Heart attack Father 60  . Stroke Father 60  . Diabetes Father   . Hypertension Father   . Heart disease Father     before age 64  . Breast cancer Mother   . Cancer Mother   . Diabetes Sister   . Cancer Sister   . Peripheral vascular disease Sister   . Breast cancer Sister   . COPD Neg Hx   . Asthma Neg Hx   . Colon cancer Neg Hx   . Esophageal cancer Neg Hx   . Rectal cancer Neg Hx   . Stomach cancer Neg Hx   . Hypertension Son    Past Surgical History  Procedure Laterality Date  . Colectomy  1998    For cancer  . Refractive surgery    . Carpal tunnel release      Bilateral   . Tonsillectomy and adenoidectomy    . Appendectomy    . Colonoscopy  2008    Dr.Stark, Due 2011  . Cataract extraction  2006    Implants  Bilaterally    Short Social History:  History  Substance Use Topics  . Smoking status: Former Smoker -- 2 years    Types: Cigarettes    Quit date: 06/04/1973  . Smokeless tobacco: Never Used     Comment: Smoked for 5 years 1965-1970 up to 1/2 pp week  . Alcohol Use: No     Comment:  Minimally   Allergies  Allergen Reactions  . Pravastatin Sodium     REACTION: anemia as per Dr Gilford Rile, [facility]   Review of Systems  Constitutional: Negative for chills and fever.  Eyes: Negative for loss of vision.  Respiratory: Negative for cough and wheezing.  Cardiovascular: Positive for dyspnea with exertion. Negative for chest pain, chest tightness, claudication, orthopnea and palpitations.  GI: Negative for blood in stool and vomiting.  GU: Negative for dysuria and hematuria.  Musculoskeletal: Negative for leg pain, joint pain and myalgias.  Skin: Negative for rash and wound.  Neurological: Negative for dizziness and speech difficulty.  Hematologic: Negative for bruises/bleeds easily. Psychiatric: Negative for depressed mood.     For VQI Use Only: Pre-ADM Living:  [    ]  Home   [    ]Nursing Home  [   ]Homeless Amb Status:  [   ] Walking [   ]Walking w/ assisitance  [   ]Wheelchair  [   ]Bed ridden     Objective:  Objective  Filed Vitals:   11/11/13 0900  BP: 174/64  Pulse: 63  Height: 5' (1.524 m)  Weight: 147 lb 11.2 oz (66.996 kg)  SpO2: 100%   Body mass index is 28.85 kg/(m^2).  Physical Exam  Constitutional: She is oriented to person, place, and time. She appears well-developed and well-nourished.  HENT:  Head: Normocephalic and atraumatic.  Neck: Neck supple. No JVD present. No thyromegaly present.  Cardiovascular: Normal rate, regular rhythm and normal heart sounds.  Exam reveals no friction rub.   No murmur heard. Pulses:      Radial pulses are 2+ on the right side, and 2+ on the left side.       Femoral pulses are 2+ on the right side, and 2+ on the left side.       Popliteal pulses are 2+ on the right side, and 2+ on the left side.       Dorsalis pedis pulses are 2+ on the right side, and 2+ on the left side.  Pulmonary/Chest: Breath sounds normal. She has no wheezes. She has no rales.  Abdominal: Soft. Bowel sounds are normal. There is no tenderness.  I do not palpate an aneurysm.  Musculoskeletal: Normal range of motion. She exhibits no edema.  Lymphadenopathy:    She has no cervical adenopathy.  Neurological: She is alert and oriented to person, place, and time. She has normal strength. No sensory deficit.  Skin: No lesion and no rash noted.  Psychiatric: She has a normal mood and affect.    Data: I have independently interpreted her arterial Doppler study which shows normal ABIs bilaterally. ABI is 100% on the right and 100% on the left.  Toe pressure on the right is 185 mmHg. Toe pressure on the left is 188 mmHg. Her right posterior tibial signal was triphasic. Her right dorsalis pedis signal is biphasic. Her left posterior tibial signal is biphasic. Her left dorsalis pedis signal was triphasic.  I have reviewed her records from Dr. Almetta Lovely office. She does have a history of essential hypertension which is been well controlled. Her diabetes has also been well controlled.      Assessment/Plan:     Pain in limb I do not think her pain in her anterior legs is related to peripheral vascular disease. Her arterial Doppler study is normal and she has palpable pedal pulses. Likewise the pain is in the anterior part of her legs and sounds more musculoskeletal. I would suspect her to have calf claudication it is related to peripheral vascular disease. Likewise on exam I do not see evidence of significant chronic venous insufficiency. Have reassured her that I do not think this is anything serious and that she has excellent arterial flow bilaterally. I'll be happy to see her back at any time if any new vascular issues arise.   [name]  MD [facility] #

## 2013-11-22 ENCOUNTER — Other Ambulatory Visit: Payer: Self-pay | Admitting: Internal Medicine

## 2013-12-02 ENCOUNTER — Encounter: Payer: Self-pay | Admitting: Family Medicine

## 2013-12-02 ENCOUNTER — Ambulatory Visit (INDEPENDENT_AMBULATORY_CARE_PROVIDER_SITE_OTHER): Payer: Medicare Other | Admitting: Family Medicine

## 2013-12-02 VITALS — BP 164/88 | HR 80 | Temp 98.1°F | Ht 60.25 in | Wt 148.5 lb

## 2013-12-02 DIAGNOSIS — E785 Hyperlipidemia, unspecified: Secondary | ICD-10-CM

## 2013-12-02 DIAGNOSIS — E1165 Type 2 diabetes mellitus with hyperglycemia: Secondary | ICD-10-CM

## 2013-12-02 DIAGNOSIS — M899 Disorder of bone, unspecified: Secondary | ICD-10-CM

## 2013-12-02 DIAGNOSIS — M79606 Pain in leg, unspecified: Secondary | ICD-10-CM

## 2013-12-02 DIAGNOSIS — I10 Essential (primary) hypertension: Secondary | ICD-10-CM

## 2013-12-02 DIAGNOSIS — M949 Disorder of cartilage, unspecified: Secondary | ICD-10-CM

## 2013-12-02 DIAGNOSIS — N259 Disorder resulting from impaired renal tubular function, unspecified: Secondary | ICD-10-CM

## 2013-12-02 DIAGNOSIS — M353 Polymyalgia rheumatica: Secondary | ICD-10-CM

## 2013-12-02 DIAGNOSIS — M79609 Pain in unspecified limb: Secondary | ICD-10-CM

## 2013-12-02 DIAGNOSIS — M8580 Other specified disorders of bone density and structure, unspecified site: Secondary | ICD-10-CM

## 2013-12-02 DIAGNOSIS — IMO0001 Reserved for inherently not codable concepts without codable children: Secondary | ICD-10-CM

## 2013-12-02 LAB — BASIC METABOLIC PANEL
BUN: 32 mg/dL — ABNORMAL HIGH (ref 6–23)
CHLORIDE: 107 meq/L (ref 96–112)
CO2: 25 meq/L (ref 19–32)
Calcium: 9.7 mg/dL (ref 8.4–10.5)
Creatinine, Ser: 1.7 mg/dL — ABNORMAL HIGH (ref 0.4–1.2)
GFR: 30.37 mL/min — ABNORMAL LOW (ref >60.00)
Glucose, Bld: 166 mg/dL — ABNORMAL HIGH (ref 70–99)
Potassium: 4.4 mEq/L (ref 3.5–5.1)
SODIUM: 141 meq/L (ref 135–145)

## 2013-12-02 LAB — HM DIABETES EYE EXAM

## 2013-12-02 LAB — CK: Total CK: 178 U/L — ABNORMAL HIGH (ref 7–177)

## 2013-12-02 MED ORDER — ATORVASTATIN CALCIUM 40 MG PO TABS: 40.0000 mg | ORAL_TABLET | Freq: Every day | ORAL | Status: DC

## 2013-12-02 MED ORDER — POTASSIUM CHLORIDE CRYS ER 20 MEQ PO TBCR: 20.0000 meq | EXTENDED_RELEASE_TABLET | Freq: Once | ORAL | Status: DC

## 2013-12-02 MED ORDER — RAMIPRIL 10 MG PO CAPS: ORAL_CAPSULE | ORAL | Status: DC

## 2013-12-02 MED ORDER — POTASSIUM CHLORIDE CRYS ER 10 MEQ PO TBCR: 10.0000 meq | EXTENDED_RELEASE_TABLET | Freq: Once | ORAL | Status: DC

## 2013-12-02 MED ORDER — CARVEDILOL 25 MG PO TABS: 25.0000 mg | ORAL_TABLET | Freq: Two times a day (BID) | ORAL | Status: DC

## 2013-12-02 NOTE — Patient Instructions (Addendum)
Let's restart vitamin D 2000 daily. Restart potassium. Blood work today. Let's try water aerobics for leg pain and let me know how you do. Return in next few months for fasting blood work and afterwards for wellness exam.

## 2013-12-02 NOTE — Assessment & Plan Note (Signed)
Hip girdle pain today - but pt denies similar sxs to prior PMR. Did not check ESR today. Consider next visit if persistent pain. 

## 2013-12-02 NOTE — Progress Notes (Signed)
Pre visit review using our clinic review tool, if applicable. No additional management support is needed unless otherwise documented below in the visit note. 

## 2013-12-02 NOTE — Assessment & Plan Note (Signed)
Chronic, stable. continue atorvastatin 40mg  daily. May need to do trial off statin to eval for improvement of leg pains. Check CPK today.

## 2013-12-02 NOTE — Assessment & Plan Note (Signed)
Followed by endo. Currently participant in study for injectable med in place of humalog. Endorses worse sugars since study started, rec discuss this with Dr. Chalmers Cater at upcoming appt.

## 2013-12-02 NOTE — Assessment & Plan Note (Signed)
Reviewed dx with patient. Encouraged restart vit D. Consider checking levels at next visit. F/u DEXA due 02/2014

## 2013-12-02 NOTE — Assessment & Plan Note (Signed)
Chronic. Elevated today. Attributes to white coat hypertension. On my manual recheck remaining very elevated. Will ask her to bring her home cuff to compare to our reading at next visit.

## 2013-12-02 NOTE — Progress Notes (Signed)
BP 164/88  Pulse 80  Temp(Src) 98.1 F (36.7 C) (Oral)  Ht 5' 0.25" (1.53 m)  Wt 148 lb 8 oz (67.359 kg)  BMI 28.77 kg/m2   CC: transfer from Dr. Linna Darner  Subjective:    Patient ID: Taylor Castaneda, female    DOB: June 07, 1932, 80 y.o.   MRN: [id number]  HPI: Taylor Castaneda is a 80 y.o. female presenting on 12/02/2013 for Establish Care   DM - followed by endo Dr Chalmers Cater (2002). lantus 50 u at bedtime and humalog 12 u tid with meals. Currently participating in diabetic study - taking another med instead of humalog. F/u appt 12/14/2013  Lab Results  Component Value Date   HGBA1C 10.7* 06/19/2010  last A1c was 8.3%.  HTN - has bp cuff at home. Checks QOD and running 120/70s at home. States running well controlled at home.  Renal insuff - has not established nephrology. Will check BMP today.  Noticing some leg pain with walking - describes anterior leg pain. Had normal ABIs by [facility] (1.0 and 1.03). Wants to start water aerobics. Denies back pain. H/o PMR presented in shoulders s/p prednisone therapy. States this feels different, no am stiffness/pain.  Osteopenia - last dexa 02/2012. stoped calcium and vit D.  Relevant past medical, surgical, family and social history reviewed and updated as indicated.  Allergies and medications reviewed and updated. Current Outpatient Prescriptions on File Prior to Visit  Medication Sig  . aspirin 81 MG tablet Take 81 mg by mouth daily.    . B-D ULTRAFINE III SHORT PEN 31G X 8 MM MISC USE AS DIRECTED WITH INSULIN  . furosemide (LASIX) 40 MG tablet TAKE 1 TABLET BY MOUTH EVERY DAY  . glucose blood (FREESTYLE LITE) test strip Use as instructed  . insulin glargine (LANTUS SOLOSTAR) 100 UNIT/ML injection Inject 50 Units into the skin at bedtime. RX'ed by Dr.Balan  . naproxen sodium (ANAPROX) 220 MG tablet Take 220 mg by mouth as needed.  . triamcinolone cream (KENALOG) 0.1 %   . Cholecalciferol (VITAMIN D-3 PO) Take 2,000 Units by mouth.  . insulin lispro  (HUMALOG KWIKPEN) 100 UNIT/ML injection Inject 12 Units into the skin 3 (three) times daily before meals.   . Omega-3 Fatty Acids (FISH OIL) 1000 MG CAPS Take 1,000 mg by mouth daily.     No current facility-administered medications on file prior to visit.    Review of Systems Per HPI unless specifically indicated above    Objective:    BP 164/88  Pulse 80  Temp(Src) 98.1 F (36.7 C) (Oral)  Ht 5' 0.25" (1.53 m)  Wt 148 lb 8 oz (67.359 kg)  BMI 28.77 kg/m2  Physical Exam  Nursing note and vitals reviewed. Constitutional: She appears well-developed and well-nourished. No distress.  HENT:  Mouth/Throat: Oropharynx is clear and moist. No oropharyngeal exudate.  Cardiovascular: Normal rate, regular rhythm, normal heart sounds and intact distal pulses.   No murmur heard. Pulmonary/Chest: Effort normal and breath sounds normal. No respiratory distress. She has no wheezes. She has no rales.  Musculoskeletal: She exhibits no edema.  2+ Dp bilaterally  Skin: Skin is warm and dry. No rash noted.  Psychiatric: She has a normal mood and affect.       Assessment & Plan:   Problem List Items Addressed This Visit   RENAL INSUFFICIENCY      Check Cr today. Creatinine, Ser  Date Value Ref Range Status  10/08/2012 1.8* 0.4 - 1.2 mg/dL Final  PMR (polymyalgia rheumatica)     Hip girdle pain today - but pt denies similar sxs to prior PMR. Did not check ESR today. Consider next visit if persistent pain.    Pain in limb     Normal ABIs recently. Consider neurogenic claudication. Will have her try water aerobics and then reassess. No significant CVI per [facility] recent eval. ?statin related - check CPK today.    Relevant Orders      CK   Osteopenia     Reviewed dx with patient. Encouraged restart vit D. Consider checking levels at next visit. F/u DEXA due 02/2014    HYPERTENSION - Primary     Chronic. Elevated today. Attributes to white coat hypertension. On my manual recheck  remaining very elevated. Will ask her to bring her home cuff to compare to our reading at next visit.    Relevant Medications      carvedilol (COREG) tablet      atorvastatin (LIPITOR) tablet      ramipril (ALTACE) capsule   Other Relevant Orders      Basic metabolic panel   HYPERLIPIDEMIA     Chronic, stable. continue atorvastatin 50m daily. May need to do trial off statin to eval for improvement of leg pains. Check CPK today.    Relevant Medications      carvedilol (COREG) tablet      atorvastatin (LIPITOR) tablet      ramipril (ALTACE) capsule   DIABETES MELLITUS, UNCONTROLLED     Followed by endo. Currently participant in study for injectable med in place of humalog. Endorses worse sugars since study started, rec discuss this with Dr. BChalmers Caterat upcoming appt.    Relevant Medications      atorvastatin (LIPITOR) tablet      ramipril (ALTACE) capsule       Follow up plan: Return in about 3 months (around 03/04/2014), or as needed, for medicare wellness.

## 2013-12-02 NOTE — Assessment & Plan Note (Signed)
Check Cr today. Creatinine, Ser  Date Value Ref Range Status  10/08/2012 1.8* 0.4 - 1.2 mg/dL Final

## 2013-12-02 NOTE — Assessment & Plan Note (Signed)
Normal ABIs recently. Consider neurogenic claudication. Will have her try water aerobics and then reassess. No significant CVI per [facility] recent eval. ?statin related - check CPK today.

## 2013-12-03 ENCOUNTER — Telehealth: Payer: Self-pay | Admitting: Family Medicine

## 2013-12-03 ENCOUNTER — Other Ambulatory Visit: Payer: Self-pay | Admitting: Family Medicine

## 2013-12-03 NOTE — Telephone Encounter (Signed)
Relevant patient education mailed to patient.  

## 2014-02-02 LAB — HEMOGLOBIN A1C: Hgb A1c MFr Bld: 7.6 % — AB (ref 4.0–6.0)

## 2014-03-01 ENCOUNTER — Other Ambulatory Visit: Payer: Self-pay

## 2014-03-01 NOTE — Telephone Encounter (Signed)
I see where you have seen this patient in the past. You are not listed as PCP

## 2014-03-01 NOTE — Telephone Encounter (Signed)
Seeing Dr Lynnae Sandhoff

## 2014-03-03 ENCOUNTER — Other Ambulatory Visit: Payer: Self-pay | Admitting: Internal Medicine

## 2014-03-10 ENCOUNTER — Encounter: Payer: Self-pay | Admitting: Family Medicine

## 2014-03-10 ENCOUNTER — Ambulatory Visit (INDEPENDENT_AMBULATORY_CARE_PROVIDER_SITE_OTHER): Payer: Medicare Other | Admitting: Family Medicine

## 2014-03-10 VITALS — BP 160/60 | HR 68 | Temp 98.0°F | Ht 60.25 in | Wt 148.8 lb

## 2014-03-10 DIAGNOSIS — E1129 Type 2 diabetes mellitus with other diabetic kidney complication: Secondary | ICD-10-CM

## 2014-03-10 DIAGNOSIS — E1121 Type 2 diabetes mellitus with diabetic nephropathy: Secondary | ICD-10-CM

## 2014-03-10 DIAGNOSIS — E1165 Type 2 diabetes mellitus with hyperglycemia: Secondary | ICD-10-CM

## 2014-03-10 DIAGNOSIS — E785 Hyperlipidemia, unspecified: Secondary | ICD-10-CM

## 2014-03-10 DIAGNOSIS — M79606 Pain in leg, unspecified: Secondary | ICD-10-CM

## 2014-03-10 DIAGNOSIS — Z0000 Encounter for general adult medical examination without abnormal findings: Secondary | ICD-10-CM | POA: Insufficient documentation

## 2014-03-10 DIAGNOSIS — N289 Disorder of kidney and ureter, unspecified: Secondary | ICD-10-CM

## 2014-03-10 DIAGNOSIS — Z7189 Other specified counseling: Secondary | ICD-10-CM

## 2014-03-10 DIAGNOSIS — Z23 Encounter for immunization: Secondary | ICD-10-CM

## 2014-03-10 DIAGNOSIS — M8580 Other specified disorders of bone density and structure, unspecified site: Secondary | ICD-10-CM

## 2014-03-10 DIAGNOSIS — IMO0002 Reserved for concepts with insufficient information to code with codable children: Secondary | ICD-10-CM

## 2014-03-10 DIAGNOSIS — I10 Essential (primary) hypertension: Secondary | ICD-10-CM

## 2014-03-10 LAB — RENAL FUNCTION PANEL
Albumin: 3.4 g/dL — ABNORMAL LOW (ref 3.5–5.2)
BUN: 40 mg/dL — ABNORMAL HIGH (ref 6–23)
CALCIUM: 9.6 mg/dL (ref 8.4–10.5)
CHLORIDE: 108 meq/L (ref 96–112)
CO2: 22 mEq/L (ref 19–32)
CREATININE: 1.8 mg/dL — AB (ref 0.4–1.2)
GFR: 29.17 mL/min — ABNORMAL LOW (ref >60.00)
Glucose, Bld: 161 mg/dL — ABNORMAL HIGH (ref 70–99)
Phosphorus: 3.4 mg/dL (ref 2.3–4.6)
Potassium: 5 mEq/L (ref 3.5–5.1)
Sodium: 140 mEq/L (ref 135–145)

## 2014-03-10 LAB — LIPID PANEL
Cholesterol: 155 mg/dL (ref 0–200)
HDL: 30.6 mg/dL — AB (ref >39.00)
NonHDL: 124.4
TRIGLYCERIDES: 235 mg/dL — AB (ref 0.0–149.0)
Total CHOL/HDL Ratio: 5
VLDL: 47 mg/dL — ABNORMAL HIGH (ref 0.0–40.0)

## 2014-03-10 LAB — MICROALBUMIN / CREATININE URINE RATIO
CREATININE, U: 119.4 mg/dL
MICROALB UR: 7.3 mg/dL — AB (ref 0.0–1.9)
MICROALB/CREAT RATIO: 6.1 mg/g (ref 0.0–30.0)

## 2014-03-10 LAB — CBC WITH DIFFERENTIAL/PLATELET
BASOS ABS: 0.1 10*3/uL (ref 0.0–0.1)
Basophils Relative: 0.7 % (ref 0.0–3.0)
EOS PCT: 4.5 % (ref 0.0–5.0)
Eosinophils Absolute: 0.4 10*3/uL (ref 0.0–0.7)
HEMATOCRIT: 37.7 % (ref 36.0–46.0)
Hemoglobin: 12.3 g/dL (ref 12.0–15.0)
LYMPHS PCT: 28.9 % (ref 12.0–46.0)
Lymphs Abs: 2.3 10*3/uL (ref 0.7–4.0)
MCHC: 32.6 g/dL (ref 30.0–36.0)
MCV: 97.6 fl (ref 78.0–100.0)
Monocytes Absolute: 0.8 10*3/uL (ref 0.1–1.0)
Monocytes Relative: 10.1 % (ref 3.0–12.0)
NEUTROS ABS: 4.5 10*3/uL (ref 1.4–7.7)
Neutrophils Relative %: 55.8 % (ref 43.0–77.0)
Platelets: 163 10*3/uL (ref 150.0–400.0)
RBC: 3.86 Mil/uL — AB (ref 3.87–5.11)
RDW: 14.1 % (ref 11.5–15.5)
WBC: 8.1 10*3/uL (ref 4.0–10.5)

## 2014-03-10 LAB — LDL CHOLESTEROL, DIRECT: Direct LDL: 86.7 mg/dL

## 2014-03-10 LAB — VITAMIN D 25 HYDROXY (VIT D DEFICIENCY, FRACTURES): VITD: 58.52 ng/mL (ref 30.00–100.00)

## 2014-03-10 NOTE — Patient Instructions (Addendum)
Flu shot today. prevnar today. Blood work today - depending on results we may start third blood pressure medication. See Rosaria Ferries about bone density scan. Bring me copy of your living will and health care power of attorney. Good to see you today. Call us with questions.

## 2014-03-10 NOTE — Assessment & Plan Note (Signed)
Again elevated today. Home bp cuff consistent with this. Pt attributes to white coat HTN.  Check labs today, consider starting amlodipine 2.5mg .

## 2014-03-10 NOTE — Assessment & Plan Note (Signed)
Chronic, stable. Continue lipitor 20mg  daily.

## 2014-03-10 NOTE — Assessment & Plan Note (Signed)
Chronic, stable. Followed by endo Dr Chalmers Cater. Foot exam today.

## 2014-03-10 NOTE — Addendum Note (Signed)
Addended by: Royann Shivers A on: 03/10/2014 10:24 AM   Modules accepted: Orders

## 2014-03-10 NOTE — Assessment & Plan Note (Signed)
Recheck dexa. Check vit D.

## 2014-03-10 NOTE — Assessment & Plan Note (Signed)
Normal ABIs recently. No significant CVI per [facility]. Gabapentin may be helping - will continue to monitor. Continue statin.

## 2014-03-10 NOTE — Progress Notes (Signed)
Pre visit review using our clinic review tool, if applicable. No additional management support is needed unless otherwise documented below in the visit note. 

## 2014-03-10 NOTE — Assessment & Plan Note (Signed)
Advanced directive - has living will at home. HCPOA is daughter Taylor Castaneda. Will bring me copy for our chart.

## 2014-03-10 NOTE — Assessment & Plan Note (Signed)
Recheck Cr today along with other fasting labwork.

## 2014-03-10 NOTE — Assessment & Plan Note (Signed)

## 2014-03-10 NOTE — Progress Notes (Signed)
BP 160/60  Pulse 68  Temp(Src) 98 F (36.7 C) (Oral)  Ht 5' 0.25" (1.53 m)  Wt 148 lb 12 oz (67.473 kg)  BMI 28.82 kg/m2   CC: medicare wellness visit  Subjective:    Patient ID: Taylor Castaneda, female    DOB: Nov 01, 1932, 80 y.o.   MRN: [id number]  HPI: Taylor Castaneda is a 80 y.o. female presenting on 03/10/2014 for Annual Exam   Brings bp cuff from home, 176/86 HR 70. Did not take bp meds this morning. No HA, vision changes, CP/tightness, SOB, leg swelling. BP Readings from Last 3 Encounters:  03/10/14 160/60  12/02/13 164/88  11/11/13 174/64    DM - followed by endo Dr. Suzette Battiest. Saw last month. A1c was 7.6%.  Passes hearing screen. Recent eye exam with eye doctor. Denies depression/sadness/anhedonia. No falls in the past year.  Preventative: COLONOSCOPY Date: 06/2013 1 hyperplastic polyp, ileocecal anastomosis Fuller Plan)  DEXA - Date: 02/2012 T score -2.3 at hip, rpt 2 yrs Well woman with OBGYN Dr Darin Engels - mammogram done last year. Will discuss with OBGYN Flu - today Pneumovax 2012, prevnar today  Td 1998 zostavax done per patient Advanced directive - has living will at home. HCPOA is daughter Taylor Castaneda. Will bring me copy for our chart.  Relevant past medical, surgical, family and social history reviewed and updated as indicated.  Allergies and medications reviewed and updated. Current Outpatient Prescriptions on File Prior to Visit  Medication Sig  . aspirin 81 MG tablet Take 81 mg by mouth daily.    Marland Kitchen atorvastatin (LIPITOR) 40 MG tablet Take 0.5 tablets (20 mg total) by mouth daily at 6 PM.  . B-D ULTRAFINE III SHORT PEN 31G X 8 MM MISC USE AS DIRECTED WITH INSULIN  . carvedilol (COREG) 25 MG tablet Take 1 tablet (25 mg total) by mouth 2 (two) times daily with a meal.  . Cholecalciferol (VITAMIN D-3 PO) Take 2,000 Units by mouth.  . furosemide (LASIX) 40 MG tablet TAKE 1 TABLET BY MOUTH EVERY DAY  . glucose blood (FREESTYLE LITE) test strip Use as  instructed  . insulin glargine (LANTUS SOLOSTAR) 100 UNIT/ML injection Inject 55 Units into the skin at bedtime. AG'TX by Dr.Balan  . insulin lispro (HUMALOG KWIKPEN) 100 UNIT/ML injection Inject 12 Units into the skin 3 (three) times daily before meals.   . naproxen sodium (ANAPROX) 220 MG tablet Take 220 mg by mouth as needed.  . Omega-3 Fatty Acids (FISH OIL) 1000 MG CAPS Take 1,000 mg by mouth daily.    . potassium chloride SA (K-DUR,KLOR-CON) 10 MEQ tablet Take 1 tablet (10 mEq total) by mouth once.  . ramipril (ALTACE) 10 MG capsule TAKE ONE CAPSULE BY MOUTH DAILY  . STUDY MEDICATION Inject 10 Units into the skin 3 (three) times daily before meals.  . triamcinolone cream (KENALOG) 0.1 %    No current facility-administered medications on file prior to visit.    Review of Systems Per HPI unless specifically indicated above    Objective:    BP 160/60  Pulse 68  Temp(Src) 98 F (36.7 C) (Oral)  Ht 5' 0.25" (1.53 m)  Wt 148 lb 12 oz (67.473 kg)  BMI 28.82 kg/m2  Physical Exam  Nursing note and vitals reviewed. Constitutional: She is oriented to person, place, and time. She appears well-developed and well-nourished. No distress.  HENT:  Head: Normocephalic and atraumatic.  Right Ear: Hearing, tympanic membrane, external ear and ear canal normal.  Left Ear: Hearing, tympanic membrane, external ear and ear canal normal.  Nose: Nose normal.  Mouth/Throat: Uvula is midline, oropharynx is clear and moist and mucous membranes are normal. No oropharyngeal exudate, posterior oropharyngeal edema or posterior oropharyngeal erythema.  Eyes: Conjunctivae and EOM are normal. Pupils are equal, round, and reactive to light. No scleral icterus.  Neck: Normal range of motion. Neck supple. Carotid bruit is not present. No thyromegaly present.  Cardiovascular: Normal rate, regular rhythm, normal heart sounds and intact distal pulses.   No murmur heard. Pulses:      Radial pulses are 2+ on the  right side, and 2+ on the left side.  Pulmonary/Chest: Effort normal and breath sounds normal. No respiratory distress. She has no wheezes. She has no rales.  Abdominal: Soft. Bowel sounds are normal. She exhibits no distension and no mass. There is no tenderness. There is no rebound and no guarding.  Musculoskeletal: Normal range of motion. She exhibits no edema.  Diabetic foot exam: Normal inspection No skin breakdown No calluses  Normal DP pulses Normal sensation to light touch and monofilament Nails normal  Lymphadenopathy:    She has no cervical adenopathy.  Neurological: She is alert and oriented to person, place, and time.  CN grossly intact, station and gait intact Recall 3/3  Calculation 5/5 serial 3s  Skin: Skin is warm and dry. No rash noted.  Psychiatric: She has a normal mood and affect. Her behavior is normal. Judgment and thought content normal.   Results for orders placed in visit on 03/10/14  HM MAMMOGRAPHY      Result Value Ref Range   HM Mammogram per pt with OBGYN    HEMOGLOBIN A1C      Result Value Ref Range   Hemoglobin A1C 7.6 (*) 4.0 - 6.0 %  HM DIABETES EYE EXAM      Result Value Ref Range   HM Diabetic Eye Exam No Retinopathy  No Retinopathy      Assessment & Plan:   Problem List Items Addressed This Visit   Uncontrolled type 2 diabetes mellitus with nephropathy     Chronic, stable. Followed by endo Dr Chalmers Cater. Foot exam today.    Relevant Orders      HM DIABETES FOOT EXAM (Completed)      Microalbumin / creatinine urine ratio   Renal insufficiency     Recheck Cr today along with other fasting labwork.    Relevant Orders      Renal function panel      Vit D  25 hydroxy (rtn osteoporosis monitoring)      CBC with Differential      PTH, Intact and Calcium   Pain in limb     Normal ABIs recently. No significant CVI per [facility]. Gabapentin may be helping - will continue to monitor. Continue statin.    Osteopenia     Recheck dexa. Check vit D.      Relevant Orders      DG Bone Density   Medicare annual wellness visit, subsequent - Primary     I have personally reviewed the Medicare Annual Wellness questionnaire and have noted 1. The patient's medical and social history 2. Their use of alcohol, tobacco or illicit drugs 3. Their current medications and supplements 4. The patient's functional ability including ADL's, fall risks, home safety risks and hearing or visual impairment. 5. Diet and physical activity 6. Evidence for depression or mood disorders The patients weight, height, BMI have been recorded in the  chart.  Hearing and vision has been addressed. I have made referrals, counseling and provided education to the patient based review of the above and I have provided the pt with a written personalized care plan for preventive services. Provider list updated - see scanned questionairre.  Reviewed preventative protocols and updated unless pt declined.    HLD (hyperlipidemia)     Chronic, stable. Continue lipitor 20mg  daily.    Relevant Orders      Lipid panel   Essential hypertension     Again elevated today. Home bp cuff consistent with this. Pt attributes to white coat HTN.  Check labs today, consider starting amlodipine 2.5mg .    Advanced care planning/counseling discussion     Advanced directive - has living will at home. HCPOA is daughter Taylor Castaneda. Will bring me copy for our chart.        Follow up plan: Return in about 4 months (around 07/11/2014), or as needed, for follow up visit.

## 2014-03-11 ENCOUNTER — Encounter: Payer: Self-pay | Admitting: *Deleted

## 2014-03-11 ENCOUNTER — Other Ambulatory Visit: Payer: Self-pay | Admitting: Family Medicine

## 2014-03-11 LAB — PTH, INTACT AND CALCIUM
Calcium: 9.4 mg/dL (ref 8.4–10.5)
PTH: 52 pg/mL (ref 14–64)

## 2014-03-11 MED ORDER — AMLODIPINE BESYLATE 2.5 MG PO TABS
2.5000 mg | ORAL_TABLET | Freq: Every day | ORAL | Status: DC
Start: 2014-03-11 — End: 2015-03-26

## 2014-03-11 MED ORDER — POTASSIUM CHLORIDE CRYS ER 10 MEQ PO TBCR: 10.0000 meq | EXTENDED_RELEASE_TABLET | ORAL | Status: DC

## 2014-03-15 ENCOUNTER — Other Ambulatory Visit: Payer: Self-pay | Admitting: *Deleted

## 2014-03-15 MED ORDER — CARVEDILOL 25 MG PO TABS: 25.0000 mg | ORAL_TABLET | Freq: Two times a day (BID) | ORAL | Status: DC

## 2014-03-15 MED ORDER — ATORVASTATIN CALCIUM 40 MG PO TABS: 20.0000 mg | ORAL_TABLET | Freq: Every day | ORAL | Status: DC

## 2014-03-31 LAB — HM DEXA SCAN

## 2014-04-06 ENCOUNTER — Encounter: Payer: Self-pay | Admitting: Family Medicine

## 2014-04-06 LAB — HM MAMMOGRAPHY: HM MAMMO: NORMAL

## 2014-04-07 ENCOUNTER — Encounter: Payer: Self-pay | Admitting: *Deleted

## 2014-04-07 ENCOUNTER — Encounter: Payer: Self-pay | Admitting: Family Medicine

## 2014-04-09 ENCOUNTER — Encounter: Payer: Self-pay | Admitting: *Deleted

## 2014-06-04 DIAGNOSIS — M65842 Other synovitis and tenosynovitis, left hand: Secondary | ICD-10-CM

## 2014-06-04 HISTORY — DX: Other synovitis and tenosynovitis, left hand: M65.842

## 2014-06-16 ENCOUNTER — Other Ambulatory Visit: Payer: Self-pay | Admitting: Family Medicine

## 2014-07-14 ENCOUNTER — Ambulatory Visit: Payer: Medicare Other | Admitting: Family Medicine

## 2014-07-21 ENCOUNTER — Encounter: Payer: Self-pay | Admitting: Family Medicine

## 2014-07-21 ENCOUNTER — Ambulatory Visit (INDEPENDENT_AMBULATORY_CARE_PROVIDER_SITE_OTHER): Payer: Medicare Other | Admitting: Family Medicine

## 2014-07-21 VITALS — BP 116/60 | HR 68 | Temp 97.9°F | Wt 148.5 lb

## 2014-07-21 DIAGNOSIS — E1165 Type 2 diabetes mellitus with hyperglycemia: Secondary | ICD-10-CM

## 2014-07-21 DIAGNOSIS — M8580 Other specified disorders of bone density and structure, unspecified site: Secondary | ICD-10-CM

## 2014-07-21 DIAGNOSIS — E1129 Type 2 diabetes mellitus with other diabetic kidney complication: Secondary | ICD-10-CM

## 2014-07-21 DIAGNOSIS — N289 Disorder of kidney and ureter, unspecified: Secondary | ICD-10-CM

## 2014-07-21 DIAGNOSIS — E785 Hyperlipidemia, unspecified: Secondary | ICD-10-CM

## 2014-07-21 DIAGNOSIS — I10 Essential (primary) hypertension: Secondary | ICD-10-CM

## 2014-07-21 DIAGNOSIS — IMO0002 Reserved for concepts with insufficient information to code with codable children: Secondary | ICD-10-CM

## 2014-07-21 DIAGNOSIS — E1121 Type 2 diabetes mellitus with diabetic nephropathy: Secondary | ICD-10-CM

## 2014-07-21 LAB — RENAL FUNCTION PANEL
Albumin: 3.9 g/dL (ref 3.5–5.2)
BUN: 44 mg/dL — ABNORMAL HIGH (ref 6–23)
CALCIUM: 9.7 mg/dL (ref 8.4–10.5)
CO2: 25 mEq/L (ref 19–32)
Chloride: 104 mEq/L (ref 96–112)
Creatinine, Ser: 1.89 mg/dL — ABNORMAL HIGH (ref 0.40–1.20)
GFR: 27.19 mL/min — ABNORMAL LOW (ref >60.00)
Glucose, Bld: 192 mg/dL — ABNORMAL HIGH (ref 70–99)
PHOSPHORUS: 4 mg/dL (ref 2.3–4.6)
Potassium: 4.7 mEq/L (ref 3.5–5.1)
SODIUM: 139 meq/L (ref 135–145)

## 2014-07-21 LAB — HEMOGLOBIN A1C: HEMOGLOBIN A1C: 8.1 % — AB (ref 4.6–6.5)

## 2014-07-21 LAB — HM DIABETES FOOT EXAM

## 2014-07-21 NOTE — Assessment & Plan Note (Signed)
Chronic, stable. Continue lipitor 20mg  daily.

## 2014-07-21 NOTE — Assessment & Plan Note (Signed)
Check A1c today and forward results to Dr Chalmers Cater. Foot exam today.  Pt now off study medication.

## 2014-07-21 NOTE — Assessment & Plan Note (Signed)
Recheck renal function panel today. On ACEI.

## 2014-07-21 NOTE — Patient Instructions (Addendum)
Continue current medicines as you are doing well. Blood work today - we will fax to Dr Almetta Lovely office as well. RTC 6 mo f/u visit

## 2014-07-21 NOTE — Progress Notes (Signed)
BP 116/60 mmHg  Pulse 68  Temp(Src) 97.9 F (36.6 C) (Oral)  Wt 148 lb 8 oz (67.359 kg)   CC: 4 mo f/u visit  Subjective:    Patient ID: Taylor Castaneda, female    DOB: 1932/12/18, 81 y.o.   MRN: [id number]  HPI: Taylor Castaneda is a 81 y.o. female presenting on 07/21/2014 for Follow-up   HTN - Compliant with current antihypertensive regimen of amlodipine 2.5mg  daily, carvedilol 25mg  bid, lasix 40mg  daily, and ramipril 10mg  daily.  Does check blood pressures at home: 938H systolic.  Has had some low blood pressure readings. Denies HA, vision changes, CP/tightness, SOB, leg swelling.   Noticing increasing leg cramps at night time. Taking OTC leg cramp med which helps some.  DM - followed by Dr Chalmers Cater. Last A1c was 8.3%. Has f/u appt next week.  Osteopenia - by recent DEXA. On cal/mg/vit D + extra 2000 IU vit D.  HLD - compliant with lipitor 20mg  daily without myalgias.  Relevant past medical, surgical, family and social history reviewed and updated as indicated. Interim medical history since our last visit reviewed. Allergies and medications reviewed and updated. Current Outpatient Prescriptions on File Prior to Visit  Medication Sig  . amLODipine (NORVASC) 2.5 MG tablet Take 1 tablet (2.5 mg total) by mouth daily.  Marland Kitchen aspirin 81 MG tablet Take 81 mg by mouth daily.    Marland Kitchen atorvastatin (LIPITOR) 40 MG tablet Take one-half tablet (20mg ) at daily 6PM  . B-D ULTRAFINE III SHORT PEN 31G X 8 MM MISC USE AS DIRECTED WITH INSULIN  . Calcium-Magnesium-Vitamin D 200-100-33.3 MG-MG-UNIT CAPS Take 1 tablet by mouth daily.  . carvedilol (COREG) 25 MG tablet Take 1 tablet (25 mg total) by mouth 2 (two) times daily with a meal.  . Cholecalciferol (VITAMIN D-3 PO) Take 2,000 Units by mouth.  . furosemide (LASIX) 40 MG tablet TAKE 1 TABLET BY MOUTH EVERY DAY  . gabapentin (NEURONTIN) 100 MG capsule Take 100 mg by mouth at bedtime.  . Glucosamine HCl (GLUCOSAMINE MAXIMUM STRENGTH PO) Take 1 tablet by  mouth daily.  Marland Kitchen glucose blood (FREESTYLE LITE) test strip Use as instructed  . insulin glargine (LANTUS SOLOSTAR) 100 UNIT/ML injection Inject 55 Units into the skin at bedtime. WE'XH by Dr.Balan  . insulin lispro (HUMALOG KWIKPEN) 100 UNIT/ML injection Inject 12 Units into the skin 3 (three) times daily before meals.   . Multiple Vitamin (MULTIVITAMIN) tablet Take 1 tablet by mouth daily.  . naproxen sodium (ANAPROX) 220 MG tablet Take 220 mg by mouth as needed.  . Omega-3 Fatty Acids (FISH OIL) 1000 MG CAPS Take 1,000 mg by mouth daily.    . potassium chloride (K-DUR,KLOR-CON) 10 MEQ tablet Take 1 tablet (10 mEq total) by mouth every Monday, Wednesday, and Friday.  . ramipril (ALTACE) 10 MG capsule TAKE ONE CAPSULE BY MOUTH DAILY  . triamcinolone cream (KENALOG) 0.1 %   . Biotin 10 MG TABS Take 1 tablet by mouth daily.   No current facility-administered medications on file prior to visit.    Review of Systems Per HPI unless specifically indicated above     Objective:    BP 116/60 mmHg  Pulse 68  Temp(Src) 97.9 F (36.6 C) (Oral)  Wt 148 lb 8 oz (67.359 kg)  Wt Readings from Last 3 Encounters:  07/21/14 148 lb 8 oz (67.359 kg)  03/10/14 148 lb 12 oz (67.473 kg)  12/02/13 148 lb 8 oz (67.359 kg)    Physical  Exam  Constitutional: She appears well-developed and well-nourished. No distress.  HENT:  Head: Normocephalic and atraumatic.  Right Ear: External ear normal.  Left Ear: External ear normal.  Nose: Nose normal.  Mouth/Throat: Oropharynx is clear and moist. No oropharyngeal exudate.  Eyes: Conjunctivae and EOM are normal. Pupils are equal, round, and reactive to light. No scleral icterus.  Neck: Normal range of motion. Neck supple.  Cardiovascular: Normal rate, regular rhythm, normal heart sounds and intact distal pulses.   No murmur heard. Pulmonary/Chest: Effort normal and breath sounds normal. No respiratory distress. She has no wheezes. She has no rales.    Musculoskeletal: She exhibits no edema.  See HPI for foot exam if done  Lymphadenopathy:    She has no cervical adenopathy.  Skin: Skin is warm and dry. No rash noted.  Psychiatric: She has a normal mood and affect.  Nursing note and vitals reviewed.  Results for orders placed or performed in visit on 04/07/14  HM DEXA SCAN  Result Value Ref Range   HM Dexa Scan T -2.2       Assessment & Plan:   Problem List Items Addressed This Visit    Uncontrolled type 2 diabetes mellitus with nephropathy - Primary    Check A1c today and forward results to Dr Chalmers Cater. Foot exam today.  Pt now off study medication.      Relevant Orders   Hemoglobin A1c   Renal insufficiency    Recheck renal function panel today. On ACEI.      Relevant Orders   Renal function panel   Osteopenia    Prior on fosamax x ?2 yrs. T score -2.2 at recent DEXA 04/2014.  Continue calcium and vit D 2000 IU daily.      HLD (hyperlipidemia)    Chronic, stable. Continue lipitor 20mg  daily.      Essential hypertension    Chronic, stable. Significant improvement with addition of amlodipine 2.5mg  daily - no pedal edema. Continue current regimen. Check Cr today.          Follow up plan: Return in about 6 months (around 01/19/2015), or if symptoms worsen or fail to improve, for follow up visit.

## 2014-07-21 NOTE — Assessment & Plan Note (Signed)
Prior on fosamax x ?2 yrs. T score -2.2 at recent DEXA 04/2014.  Continue calcium and vit D 2000 IU daily.

## 2014-07-21 NOTE — Progress Notes (Signed)
Pre visit review using our clinic review tool, if applicable. No additional management support is needed unless otherwise documented below in the visit note. 

## 2014-07-21 NOTE — Assessment & Plan Note (Signed)
Chronic, stable. Significant improvement with addition of amlodipine 2.5mg  daily - no pedal edema. Continue current regimen. Check Cr today.

## 2014-07-22 ENCOUNTER — Encounter: Payer: Self-pay | Admitting: *Deleted

## 2014-10-09 ENCOUNTER — Encounter: Payer: Self-pay | Admitting: Family Medicine

## 2014-12-13 LAB — HM DIABETES EYE EXAM

## 2014-12-14 ENCOUNTER — Encounter: Payer: Self-pay | Admitting: Family Medicine

## 2014-12-18 ENCOUNTER — Other Ambulatory Visit: Payer: Self-pay | Admitting: Family Medicine

## 2015-01-08 ENCOUNTER — Other Ambulatory Visit: Payer: Self-pay | Admitting: Family Medicine

## 2015-01-19 ENCOUNTER — Encounter: Payer: Self-pay | Admitting: Family Medicine

## 2015-01-19 ENCOUNTER — Ambulatory Visit (INDEPENDENT_AMBULATORY_CARE_PROVIDER_SITE_OTHER): Payer: Medicare Other | Admitting: Family Medicine

## 2015-01-19 VITALS — BP 142/64 | HR 68 | Temp 97.9°F | Wt 147.8 lb

## 2015-01-19 DIAGNOSIS — E1129 Type 2 diabetes mellitus with other diabetic kidney complication: Secondary | ICD-10-CM

## 2015-01-19 DIAGNOSIS — E1165 Type 2 diabetes mellitus with hyperglycemia: Secondary | ICD-10-CM | POA: Diagnosis not present

## 2015-01-19 DIAGNOSIS — IMO0002 Reserved for concepts with insufficient information to code with codable children: Secondary | ICD-10-CM

## 2015-01-19 DIAGNOSIS — E785 Hyperlipidemia, unspecified: Secondary | ICD-10-CM | POA: Diagnosis not present

## 2015-01-19 DIAGNOSIS — N289 Disorder of kidney and ureter, unspecified: Secondary | ICD-10-CM

## 2015-01-19 DIAGNOSIS — I10 Essential (primary) hypertension: Secondary | ICD-10-CM | POA: Diagnosis not present

## 2015-01-19 DIAGNOSIS — E1121 Type 2 diabetes mellitus with diabetic nephropathy: Secondary | ICD-10-CM

## 2015-01-19 LAB — RENAL FUNCTION PANEL
Albumin: 3.9 g/dL (ref 3.5–5.2)
BUN: 40 mg/dL — ABNORMAL HIGH (ref 6–23)
CO2: 25 meq/L (ref 19–32)
CREATININE: 1.86 mg/dL — AB (ref 0.40–1.20)
Calcium: 9.3 mg/dL (ref 8.4–10.5)
Chloride: 107 mEq/L (ref 96–112)
GFR: 27.67 mL/min — AB (ref >60.00)
Glucose, Bld: 176 mg/dL — ABNORMAL HIGH (ref 70–99)
PHOSPHORUS: 3.3 mg/dL (ref 2.3–4.6)
Potassium: 4.1 mEq/L (ref 3.5–5.1)
Sodium: 142 mEq/L (ref 135–145)

## 2015-01-19 LAB — LDL CHOLESTEROL, DIRECT: Direct LDL: 89 mg/dL

## 2015-01-19 LAB — HEMOGLOBIN A1C: Hgb A1c MFr Bld: 7.7 % — ABNORMAL HIGH (ref 4.6–6.5)

## 2015-01-19 MED ORDER — CARVEDILOL 25 MG PO TABS: 25.0000 mg | ORAL_TABLET | Freq: Two times a day (BID) | ORAL | Status: DC

## 2015-01-19 NOTE — Progress Notes (Signed)
Pre visit review using our clinic review tool, if applicable. No additional management support is needed unless otherwise documented below in the visit note. 

## 2015-01-19 NOTE — Progress Notes (Signed)
BP 142/64 mmHg  Pulse 68  Temp(Src) 97.9 F (36.6 C) (Oral)  Wt 147 lb 12 oz (67.019 kg)   CC: 6 mo f/u visit  Subjective:    Patient ID: Taylor Castaneda, female    DOB: [birth year], 81 y.o.   MRN: [id number]  HPI: Taylor Castaneda is a 81 y.o. female presenting on 01/19/2015 for Follow-up   COMPUTER SYSTEM WAS DOWN DURING THIS OFFICE VISIT - LATE ENTRY. LABS ORDERED.  DM - regularly does check sugars TID. Fasting well controlled, increase throughout rest of day. High 200s. Compliant with antihyperglycemic regimen which includes: humalog 12u with meals, lantus 55u PM. In donut hole, to discuss humalog alterantive with endo at next appt 9/25. Denies low sugars or hypoglycemic symptoms.  Denies paresthesias. Last diabetic eye exam 12/2014.  Pneumovax: 2012.  Prevnar: 2015. Lab Results  Component Value Date   HGBA1C 8.1* 07/21/2014   Diabetic Foot Exam - Simple   No data filed     HTN - Compliant with current antihypertensive regimen.  Does not check blood pressures at home. No low blood pressure symptoms of dizziness/syncope.  Denies HA, vision changes, CP/tightness, SOB, leg swelling.    HLD - compliant with lipitor without myalgias.  Relevant past medical, surgical, family and social history reviewed and updated as indicated. Interim medical history since our last visit reviewed. Allergies and medications reviewed and updated. Current Outpatient Prescriptions on File Prior to Visit  Medication Sig  . amLODipine (NORVASC) 2.5 MG tablet Take 1 tablet (2.5 mg total) by mouth daily.  Marland Kitchen aspirin 81 MG tablet Take 81 mg by mouth daily.    Marland Kitchen atorvastatin (LIPITOR) 40 MG tablet Take one-half tablet ('20mg'$ ) at daily 6PM  . B-D ULTRAFINE III SHORT PEN 31G X 8 MM MISC USE AS DIRECTED WITH INSULIN  . Biotin 10 MG TABS Take 1 tablet by mouth daily.  . Calcium-Magnesium-Vitamin D 200-100-33.3 MG-MG-UNIT CAPS Take 1 tablet by mouth daily.  . Cholecalciferol (VITAMIN D-3 PO) Take 2,000 Units by  mouth.  . furosemide (LASIX) 40 MG tablet TAKE 1 TABLET BY MOUTH EVERY DAY  . gabapentin (NEURONTIN) 100 MG capsule Take 100 mg by mouth at bedtime.  . Glucosamine HCl (GLUCOSAMINE MAXIMUM STRENGTH PO) Take 1 tablet by mouth daily.  Marland Kitchen glucose blood (FREESTYLE LITE) test strip Use as instructed  . insulin glargine (LANTUS SOLOSTAR) 100 UNIT/ML injection Inject 55 Units into the skin at bedtime. RW'ER by Dr.Balan  . insulin lispro (HUMALOG KWIKPEN) 100 UNIT/ML injection Inject 12 Units into the skin 3 (three) times daily before meals.   . naproxen sodium (ANAPROX) 220 MG tablet Take 220 mg by mouth as needed.  . Omega-3 Fatty Acids (FISH OIL) 1000 MG CAPS Take 1,000 mg by mouth daily.    . potassium chloride (K-DUR,KLOR-CON) 10 MEQ tablet Take 1 tablet (10 mEq total) by mouth every Monday, Wednesday, and Friday.  . ramipril (ALTACE) 10 MG capsule TAKE ONE CAPSULE BY MOUTH EVERY DAY  . triamcinolone cream (KENALOG) 0.1 %   . Multiple Vitamin (MULTIVITAMIN) tablet Take 1 tablet by mouth daily.   No current facility-administered medications on file prior to visit.    Review of Systems Per HPI unless specifically indicated above     Objective:    BP 142/64 mmHg  Pulse 68  Temp(Src) 97.9 F (36.6 C) (Oral)  Wt 147 lb 12 oz (67.019 kg)  Wt Readings from Last 3 Encounters:  01/19/15 147 lb 12 oz (67.019 kg)  07/21/14 148 lb 8 oz (67.359 kg)  03/10/14 148 lb 12 oz (67.473 kg)    Physical Exam  Constitutional: She appears well-developed and well-nourished. No distress.  HENT:  Head: Normocephalic and atraumatic.  Right Ear: External ear normal.  Left Ear: External ear normal.  Nose: Nose normal.  Mouth/Throat: Oropharynx is clear and moist. No oropharyngeal exudate.  Eyes: Conjunctivae and EOM are normal. Pupils are equal, round, and reactive to light. No scleral icterus.  Neck: Normal range of motion. Neck supple.  Cardiovascular: Normal rate, regular rhythm, normal heart sounds and  intact distal pulses.   No murmur heard. Pulmonary/Chest: Effort normal and breath sounds normal. No respiratory distress. She has no wheezes. She has no rales.  Musculoskeletal: She exhibits no edema.  See HPI for foot exam if done  Lymphadenopathy:    She has no cervical adenopathy.  Skin: Skin is warm and dry. No rash noted.  Psychiatric: She has a normal mood and affect.  Nursing note and vitals reviewed.  Results for orders placed or performed in visit on 01/19/15  HM DIABETES FOOT EXAM  Result Value Ref Range   HM Diabetic Foot Exam with endo        Assessment & Plan:   Problem List Items Addressed This Visit    Uncontrolled type 2 diabetes mellitus with nephropathy - Primary    Chronic but elevated readings. Check labs today. Has f/u scheduled with endo for next month. Will fax Dr Chalmers Cater today's labs.      HLD (hyperlipidemia)    Chronic, stable. Continue lipitor '40mg'$  daily without myalgias. Check d LDL today.      Relevant Medications   carvedilol (COREG) 25 MG tablet   Essential hypertension    Chronic, mildly elevated today but pt out of carvedilol over last 3 days. rec restart this - refilled to pharmacy. No other changes made today.      Relevant Medications   carvedilol (COREG) 25 MG tablet   Renal insufficiency    Check renal panel today. Continue ACEI. Avoid NSAIDs.          Follow up plan: No Follow-up on file.

## 2015-01-19 NOTE — Assessment & Plan Note (Signed)
Check renal panel today. Continue ACEI. Avoid NSAIDs.

## 2015-01-19 NOTE — Assessment & Plan Note (Signed)
Chronic but elevated readings. Check labs today. Has f/u scheduled with endo for next month. Will fax Dr Chalmers Cater today's labs.

## 2015-01-19 NOTE — Assessment & Plan Note (Addendum)
Chronic, stable. Continue lipitor '40mg'$  daily without myalgias. Check d LDL today.

## 2015-01-19 NOTE — Addendum Note (Signed)
Addended by: Daralene Milch C on: 01/19/2015 10:08 AM   Modules accepted: Orders

## 2015-01-19 NOTE — Assessment & Plan Note (Signed)
Chronic, mildly elevated today but pt out of carvedilol over last 3 days. rec restart this - refilled to pharmacy. No other changes made today.

## 2015-01-21 ENCOUNTER — Encounter: Payer: Self-pay | Admitting: *Deleted

## 2015-03-20 ENCOUNTER — Other Ambulatory Visit: Payer: Self-pay | Admitting: Family Medicine

## 2015-03-26 ENCOUNTER — Other Ambulatory Visit: Payer: Self-pay | Admitting: Family Medicine

## 2015-05-05 ENCOUNTER — Other Ambulatory Visit: Payer: Self-pay | Admitting: Family Medicine

## 2015-06-16 ENCOUNTER — Other Ambulatory Visit: Payer: Self-pay | Admitting: *Deleted

## 2015-06-16 MED ORDER — FUROSEMIDE 40 MG PO TABS: 40.0000 mg | ORAL_TABLET | Freq: Every day | ORAL | Status: DC

## 2015-07-13 DIAGNOSIS — E1165 Type 2 diabetes mellitus with hyperglycemia: Secondary | ICD-10-CM | POA: Diagnosis not present

## 2015-08-03 DIAGNOSIS — M1712 Unilateral primary osteoarthritis, left knee: Secondary | ICD-10-CM | POA: Diagnosis not present

## 2015-08-10 DIAGNOSIS — M1712 Unilateral primary osteoarthritis, left knee: Secondary | ICD-10-CM | POA: Diagnosis not present

## 2015-08-17 DIAGNOSIS — M1712 Unilateral primary osteoarthritis, left knee: Secondary | ICD-10-CM | POA: Diagnosis not present

## 2015-08-31 DIAGNOSIS — R809 Proteinuria, unspecified: Secondary | ICD-10-CM | POA: Diagnosis not present

## 2015-08-31 DIAGNOSIS — E1165 Type 2 diabetes mellitus with hyperglycemia: Secondary | ICD-10-CM | POA: Diagnosis not present

## 2015-08-31 DIAGNOSIS — I10 Essential (primary) hypertension: Secondary | ICD-10-CM | POA: Diagnosis not present

## 2015-08-31 DIAGNOSIS — E7800 Pure hypercholesterolemia, unspecified: Secondary | ICD-10-CM | POA: Diagnosis not present

## 2015-09-01 DIAGNOSIS — E1165 Type 2 diabetes mellitus with hyperglycemia: Secondary | ICD-10-CM | POA: Diagnosis not present

## 2015-09-21 ENCOUNTER — Other Ambulatory Visit: Payer: Self-pay | Admitting: *Deleted

## 2015-09-21 MED ORDER — RAMIPRIL 10 MG PO CAPS: 10.0000 mg | ORAL_CAPSULE | Freq: Every day | ORAL | Status: DC

## 2015-10-04 DIAGNOSIS — M1712 Unilateral primary osteoarthritis, left knee: Secondary | ICD-10-CM | POA: Diagnosis not present

## 2015-10-18 DIAGNOSIS — E1165 Type 2 diabetes mellitus with hyperglycemia: Secondary | ICD-10-CM | POA: Diagnosis not present

## 2015-11-22 ENCOUNTER — Other Ambulatory Visit: Payer: Self-pay | Admitting: Family Medicine

## 2015-12-01 DIAGNOSIS — E1165 Type 2 diabetes mellitus with hyperglycemia: Secondary | ICD-10-CM | POA: Diagnosis not present

## 2015-12-08 ENCOUNTER — Other Ambulatory Visit: Payer: Self-pay | Admitting: *Deleted

## 2015-12-08 MED ORDER — RAMIPRIL 10 MG PO CAPS: 10.0000 mg | ORAL_CAPSULE | Freq: Every day | ORAL | Status: DC

## 2015-12-08 MED ORDER — ATORVASTATIN CALCIUM 40 MG PO TABS: ORAL_TABLET | ORAL | Status: DC

## 2015-12-16 MED ORDER — RAMIPRIL 10 MG PO CAPS: 10.0000 mg | ORAL_CAPSULE | Freq: Every day | ORAL | Status: DC

## 2015-12-16 MED ORDER — ATORVASTATIN CALCIUM 40 MG PO TABS: ORAL_TABLET | ORAL | Status: DC

## 2015-12-16 NOTE — Addendum Note (Signed)
Addended by: Helene Shoe on: 12/16/2015 03:27 PM   Modules accepted: Orders

## 2015-12-16 NOTE — Telephone Encounter (Signed)
Taylor Castaneda with [employer] said pt requesting 90 day supply for ramipril and atorvastatin to get most benefit from ins coverage; refilled x 1; pt has cpx in 01/2016.

## 2015-12-19 DIAGNOSIS — H402212 Chronic angle-closure glaucoma, right eye, moderate stage: Secondary | ICD-10-CM | POA: Diagnosis not present

## 2015-12-19 DIAGNOSIS — H402222 Chronic angle-closure glaucoma, left eye, moderate stage: Secondary | ICD-10-CM | POA: Diagnosis not present

## 2015-12-19 DIAGNOSIS — H26492 Other secondary cataract, left eye: Secondary | ICD-10-CM | POA: Diagnosis not present

## 2015-12-19 DIAGNOSIS — Z961 Presence of intraocular lens: Secondary | ICD-10-CM | POA: Diagnosis not present

## 2015-12-26 DIAGNOSIS — E7800 Pure hypercholesterolemia, unspecified: Secondary | ICD-10-CM | POA: Diagnosis not present

## 2015-12-26 DIAGNOSIS — I10 Essential (primary) hypertension: Secondary | ICD-10-CM | POA: Diagnosis not present

## 2015-12-26 DIAGNOSIS — E1165 Type 2 diabetes mellitus with hyperglycemia: Secondary | ICD-10-CM | POA: Diagnosis not present

## 2015-12-26 DIAGNOSIS — R809 Proteinuria, unspecified: Secondary | ICD-10-CM | POA: Diagnosis not present

## 2015-12-26 DIAGNOSIS — R634 Abnormal weight loss: Secondary | ICD-10-CM | POA: Diagnosis not present

## 2016-01-13 DIAGNOSIS — E1165 Type 2 diabetes mellitus with hyperglycemia: Secondary | ICD-10-CM | POA: Diagnosis not present

## 2016-01-22 ENCOUNTER — Other Ambulatory Visit: Payer: Self-pay | Admitting: Family Medicine

## 2016-01-22 DIAGNOSIS — D649 Anemia, unspecified: Secondary | ICD-10-CM

## 2016-01-22 DIAGNOSIS — N289 Disorder of kidney and ureter, unspecified: Secondary | ICD-10-CM

## 2016-01-22 DIAGNOSIS — IMO0002 Reserved for concepts with insufficient information to code with codable children: Secondary | ICD-10-CM

## 2016-01-22 DIAGNOSIS — E1121 Type 2 diabetes mellitus with diabetic nephropathy: Secondary | ICD-10-CM

## 2016-01-22 DIAGNOSIS — E1165 Type 2 diabetes mellitus with hyperglycemia: Principal | ICD-10-CM

## 2016-01-22 DIAGNOSIS — E785 Hyperlipidemia, unspecified: Secondary | ICD-10-CM

## 2016-01-22 DIAGNOSIS — M353 Polymyalgia rheumatica: Secondary | ICD-10-CM

## 2016-01-23 ENCOUNTER — Other Ambulatory Visit (INDEPENDENT_AMBULATORY_CARE_PROVIDER_SITE_OTHER): Payer: Medicare Other

## 2016-01-23 DIAGNOSIS — E1165 Type 2 diabetes mellitus with hyperglycemia: Secondary | ICD-10-CM

## 2016-01-23 DIAGNOSIS — N289 Disorder of kidney and ureter, unspecified: Secondary | ICD-10-CM | POA: Diagnosis not present

## 2016-01-23 DIAGNOSIS — E785 Hyperlipidemia, unspecified: Secondary | ICD-10-CM | POA: Diagnosis not present

## 2016-01-23 DIAGNOSIS — R7989 Other specified abnormal findings of blood chemistry: Secondary | ICD-10-CM | POA: Diagnosis not present

## 2016-01-23 DIAGNOSIS — IMO0002 Reserved for concepts with insufficient information to code with codable children: Secondary | ICD-10-CM

## 2016-01-23 DIAGNOSIS — E1121 Type 2 diabetes mellitus with diabetic nephropathy: Secondary | ICD-10-CM

## 2016-01-23 DIAGNOSIS — M353 Polymyalgia rheumatica: Secondary | ICD-10-CM | POA: Diagnosis not present

## 2016-01-23 DIAGNOSIS — D649 Anemia, unspecified: Secondary | ICD-10-CM

## 2016-01-23 LAB — LIPID PANEL
Cholesterol: 200 mg/dL (ref 0–200)
HDL: 35.8 mg/dL — AB (ref >39.00)
NONHDL: 164.06
Total CHOL/HDL Ratio: 6
Triglycerides: 318 mg/dL — ABNORMAL HIGH (ref 0.0–149.0)
VLDL: 63.6 mg/dL — ABNORMAL HIGH (ref 0.0–40.0)

## 2016-01-23 LAB — CBC WITH DIFFERENTIAL/PLATELET
BASOS PCT: 0.9 % (ref 0.0–3.0)
Basophils Absolute: 0.1 10*3/uL (ref 0.0–0.1)
EOS PCT: 2.7 % (ref 0.0–5.0)
Eosinophils Absolute: 0.2 10*3/uL (ref 0.0–0.7)
HCT: 39.7 % (ref 36.0–46.0)
HEMOGLOBIN: 13.5 g/dL (ref 12.0–15.0)
Lymphocytes Relative: 43.5 % (ref 12.0–46.0)
Lymphs Abs: 3.5 10*3/uL (ref 0.7–4.0)
MCHC: 34 g/dL (ref 30.0–36.0)
MCV: 93.2 fl (ref 78.0–100.0)
MONO ABS: 0.9 10*3/uL (ref 0.1–1.0)
MONOS PCT: 11.2 % (ref 3.0–12.0)
Neutro Abs: 3.4 10*3/uL (ref 1.4–7.7)
Neutrophils Relative %: 41.7 % — ABNORMAL LOW (ref 43.0–77.0)
Platelets: 174 10*3/uL (ref 150.0–400.0)
RBC: 4.25 Mil/uL (ref 3.87–5.11)
RDW: 13.7 % (ref 11.5–15.5)
WBC: 8.1 10*3/uL (ref 4.0–10.5)

## 2016-01-23 LAB — RENAL FUNCTION PANEL
ALBUMIN: 4.2 g/dL (ref 3.5–5.2)
BUN: 45 mg/dL — AB (ref 6–23)
CHLORIDE: 104 meq/L (ref 96–112)
CO2: 26 meq/L (ref 19–32)
Calcium: 9.3 mg/dL (ref 8.4–10.5)
Creatinine, Ser: 1.61 mg/dL — ABNORMAL HIGH (ref 0.40–1.20)
GFR: 32.6 mL/min — ABNORMAL LOW (ref >60.00)
Glucose, Bld: 81 mg/dL (ref 70–99)
PHOSPHORUS: 4.6 mg/dL (ref 2.3–4.6)
Potassium: 4.2 mEq/L (ref 3.5–5.1)
Sodium: 140 mEq/L (ref 135–145)

## 2016-01-23 LAB — HEMOGLOBIN A1C: Hgb A1c MFr Bld: 8.3 % — ABNORMAL HIGH (ref 4.6–6.5)

## 2016-01-23 LAB — SEDIMENTATION RATE: Sed Rate: 27 mm/hr (ref 0–30)

## 2016-01-23 LAB — LDL CHOLESTEROL, DIRECT: LDL DIRECT: 119 mg/dL

## 2016-01-30 ENCOUNTER — Encounter: Payer: Self-pay | Admitting: Family Medicine

## 2016-01-30 ENCOUNTER — Ambulatory Visit (INDEPENDENT_AMBULATORY_CARE_PROVIDER_SITE_OTHER): Payer: Medicare Other | Admitting: Family Medicine

## 2016-01-30 VITALS — BP 138/62 | HR 84 | Temp 98.4°F | Ht 60.25 in | Wt 125.8 lb

## 2016-01-30 DIAGNOSIS — N183 Chronic kidney disease, stage 3 (moderate): Secondary | ICD-10-CM

## 2016-01-30 DIAGNOSIS — E1121 Type 2 diabetes mellitus with diabetic nephropathy: Secondary | ICD-10-CM

## 2016-01-30 DIAGNOSIS — I10 Essential (primary) hypertension: Secondary | ICD-10-CM

## 2016-01-30 DIAGNOSIS — IMO0002 Reserved for concepts with insufficient information to code with codable children: Secondary | ICD-10-CM

## 2016-01-30 DIAGNOSIS — Z23 Encounter for immunization: Secondary | ICD-10-CM | POA: Diagnosis not present

## 2016-01-30 DIAGNOSIS — E785 Hyperlipidemia, unspecified: Secondary | ICD-10-CM | POA: Diagnosis not present

## 2016-01-30 DIAGNOSIS — Z0000 Encounter for general adult medical examination without abnormal findings: Secondary | ICD-10-CM | POA: Diagnosis not present

## 2016-01-30 DIAGNOSIS — M8580 Other specified disorders of bone density and structure, unspecified site: Secondary | ICD-10-CM

## 2016-01-30 DIAGNOSIS — M79604 Pain in right leg: Secondary | ICD-10-CM

## 2016-01-30 DIAGNOSIS — Z7189 Other specified counseling: Secondary | ICD-10-CM

## 2016-01-30 DIAGNOSIS — M79605 Pain in left leg: Secondary | ICD-10-CM

## 2016-01-30 DIAGNOSIS — E1165 Type 2 diabetes mellitus with hyperglycemia: Secondary | ICD-10-CM

## 2016-01-30 DIAGNOSIS — M353 Polymyalgia rheumatica: Secondary | ICD-10-CM

## 2016-01-30 DIAGNOSIS — E1122 Type 2 diabetes mellitus with diabetic chronic kidney disease: Secondary | ICD-10-CM

## 2016-01-30 NOTE — Assessment & Plan Note (Signed)

## 2016-01-30 NOTE — Assessment & Plan Note (Signed)
Good pulses. Possible neurogenic claudication - has f/u with back doctor next month.  Gabapentin may be causing dizziness - will trial off and update me with effect.

## 2016-01-30 NOTE — Progress Notes (Addendum)
BP 138/62   Pulse 84   Temp 98.4 F (36.9 C) (Oral)   Ht 5' 0.25" (1.53 m)   Wt 125 lb 12 oz (57 kg)   BMI 24.36 kg/m    CC: CPE Subjective:    Patient ID: Taylor Castaneda, female    DOB: [birth year], 82 y.o.   MRN: [id number]  HPI: Taylor Castaneda is a 82 y.o. female presenting on 01/30/2016 for Annual Exam   Increasing dizziness - asks about amlodipine and gabapentin contribution.  Recently saw [facility] and received flexogenic injections. Saw Dr Reynaldo Minium. ?neurogenic claudication - pending appt with back specialist 02/16/2016. Unable to walk anymore (prior 3 mi/day).  HTN - has not been checking blood pressure at home.  DM - Endorses good sugar control (Dr Chalmers Cater). Last A1c 8% at endo.   Passes hearing screen. Recent eye exam with eye doctor. Denies depression/sadness/anhedonia. No falls in the past year.  Preventative: COLONOSCOPY Date: 06/2013 1 hyperplastic polyp, ileocecal anastomosis [facility] Plan)  DEXA - Date: 02/2012 T score -2.3 at hip, rpt 2 yrs. Calcium stopped by endo.  Well woman with OBGYN Dr Darin Engels - mammogram done last year. Will discuss with OBGYN Flu - today Pneumovax 2012, prevnar 2015 Td 1998 zostavax 2009 Advanced directive - has living will at home. HCPOA is daughter [name]. Will bring me copy for our chart. Seat belt use discussed Sunscreen use discussed. No changing moles on skin. Non smoker Alcohol - rare  Relevant past medical, surgical, family and social history reviewed and updated as indicated. Interim medical history since our last visit reviewed. Allergies and medications reviewed and updated. Current Outpatient Prescriptions on File Prior to Visit  Medication Sig  . amLODipine (NORVASC) 2.5 MG tablet TAKE 1 TABLET (2.5 MG TOTAL) BY MOUTH DAILY.  Marland Kitchen aspirin 81 MG tablet Take 81 mg by mouth daily.    Marland Kitchen atorvastatin (LIPITOR) 40 MG tablet TAKE 1/2 TABLET BY MOUTH EVERY DAY AT 6PM  . B-D ULTRAFINE III SHORT PEN 31G X 8 MM MISC USE AS  DIRECTED WITH INSULIN  . carvedilol (COREG) 25 MG tablet Take 1 tablet (25 mg total) by mouth 2 (two) times daily with a meal.  . Cholecalciferol (VITAMIN D-3 PO) Take 2,000 Units by mouth.  . furosemide (LASIX) 40 MG tablet TAKE ONE TABLET BY MOUTH ONCE DAILY  . gabapentin (NEURONTIN) 100 MG capsule Take 100 mg by mouth at bedtime.  Marland Kitchen glucose blood (FREESTYLE LITE) test strip Use as instructed  . insulin glargine (LANTUS SOLOSTAR) 100 UNIT/ML injection Inject 45 Units into the skin at bedtime. WU'JW by Dr.Balan  . insulin lispro (HUMALOG KWIKPEN) 100 UNIT/ML injection Inject 10 Units into the skin 3 (three) times daily before meals.   Marland Kitchen KLOR-CON M10 10 MEQ tablet TAKE 1 TABLET (10 MEQ TOTAL) BY MOUTH EVERY MONDAY, WEDNESDAY, AND FRIDAY.  . ramipril (ALTACE) 10 MG capsule Take 1 capsule (10 mg total) by mouth daily.  . naproxen sodium (ANAPROX) 220 MG tablet Take 220 mg by mouth as needed.   No current facility-administered medications on file prior to visit.     Review of Systems  Constitutional: Negative for activity change, appetite change, chills, fatigue, fever and unexpected weight change.  HENT: Negative for hearing loss.   Eyes: Negative for visual disturbance.  Respiratory: Negative for cough, chest tightness, shortness of breath and wheezing.   Cardiovascular: Negative for chest pain, palpitations and leg swelling.  Gastrointestinal: Negative for abdominal distention, abdominal pain,  blood in stool, constipation, diarrhea, nausea and vomiting.  Genitourinary: Negative for difficulty urinating and hematuria.  Musculoskeletal: Negative for arthralgias, myalgias and neck pain.  Skin: Negative for rash.  Neurological: Positive for dizziness (see HPI). Negative for seizures, syncope and headaches.  Hematological: Negative for adenopathy. Does not bruise/bleed easily.  Psychiatric/Behavioral: Negative for dysphoric mood. The patient is not nervous/anxious.    Per HPI unless  specifically indicated in ROS section     Objective:    BP 138/62   Pulse 84   Temp 98.4 F (36.9 C) (Oral)   Ht 5' 0.25" (1.53 m)   Wt 125 lb 12 oz (57 kg)   BMI 24.36 kg/m   Wt Readings from Last 3 Encounters:  01/30/16 125 lb 12 oz (57 kg)  01/19/15 147 lb 12 oz (67 kg)  07/21/14 148 lb 8 oz (67.4 kg)    Physical Exam  Constitutional: She is oriented to person, place, and time. She appears well-developed and well-nourished. No distress.  HENT:  Head: Normocephalic and atraumatic.  Right Ear: Hearing, tympanic membrane, external ear and ear canal normal.  Left Ear: Hearing, tympanic membrane, external ear and ear canal normal.  Nose: Nose normal.  Mouth/Throat: Uvula is midline, oropharynx is clear and moist and mucous membranes are normal. No oropharyngeal exudate, posterior oropharyngeal edema or posterior oropharyngeal erythema.  Eyes: Conjunctivae and EOM are normal. Pupils are equal, round, and reactive to light. No scleral icterus.  Neck: Normal range of motion. Neck supple. Carotid bruit is not present. No thyromegaly present.  Cardiovascular: Normal rate, regular rhythm, normal heart sounds and intact distal pulses.   No murmur heard. Pulses:      Radial pulses are 2+ on the right side, and 2+ on the left side.  Pulmonary/Chest: Effort normal and breath sounds normal. No respiratory distress. She has no wheezes. She has no rales.  Abdominal: Soft. Bowel sounds are normal. She exhibits no distension and no mass. There is no tenderness. There is no rebound and no guarding.  Musculoskeletal: Normal range of motion. She exhibits no edema.  See HPI for foot exam if done  Lymphadenopathy:    She has no cervical adenopathy.  Neurological: She is alert and oriented to person, place, and time.  CN grossly intact, station and gait intact Recall 3/3 Calculation 5/5 serial 7s  Skin: Skin is warm and dry. No rash noted.  Psychiatric: She has a normal mood and affect. Her  behavior is normal. Judgment and thought content normal.  Nursing note and vitals reviewed.  Results for orders placed or performed in visit on 01/23/16  Lipid panel  Result Value Ref Range   Cholesterol 200 0 - 200 mg/dL   Triglycerides 318.0 (H) 0.0 - 149.0 mg/dL   HDL 35.80 (L) >39.00 mg/dL   VLDL 63.6 (H) 0.0 - 40.0 mg/dL   Total CHOL/HDL Ratio 6    NonHDL 164.06   Hemoglobin A1c  Result Value Ref Range   Hgb A1c MFr Bld 8.3 (H) 4.6 - 6.5 %  Renal function panel  Result Value Ref Range   Sodium 140 135 - 145 mEq/L   Potassium 4.2 3.5 - 5.1 mEq/L   Chloride 104 96 - 112 mEq/L   CO2 26 19 - 32 mEq/L   Calcium 9.3 8.4 - 10.5 mg/dL   Albumin 4.2 3.5 - 5.2 g/dL   BUN 45 (H) 6 - 23 mg/dL   Creatinine, Ser 1.61 (H) 0.40 - 1.20 mg/dL   Glucose,  Bld 81 70 - 99 mg/dL   Phosphorus 4.6 2.3 - 4.6 mg/dL   GFR 32.60 (L) >60.00 mL/min  Sedimentation rate  Result Value Ref Range   Sed Rate 27 0 - 30 mm/hr  CBC with Differential/Platelet  Result Value Ref Range   WBC 8.1 4.0 - 10.5 K/uL   RBC 4.25 3.87 - 5.11 Mil/uL   Hemoglobin 13.5 12.0 - 15.0 g/dL   HCT 39.7 36.0 - 46.0 %   MCV 93.2 78.0 - 100.0 fl   MCHC 34.0 30.0 - 36.0 g/dL   RDW 13.7 11.5 - 15.5 %   Platelets 174.0 150.0 - 400.0 K/uL   Neutrophils Relative % 41.7 (L) 43.0 - 77.0 %   Lymphocytes Relative 43.5 12.0 - 46.0 %   Monocytes Relative 11.2 3.0 - 12.0 %   Eosinophils Relative 2.7 0.0 - 5.0 %   Basophils Relative 0.9 0.0 - 3.0 %   Neutro Abs 3.4 1.4 - 7.7 K/uL   Lymphs Abs 3.5 0.7 - 4.0 K/uL   Monocytes Absolute 0.9 0.1 - 1.0 K/uL   Eosinophils Absolute 0.2 0.0 - 0.7 K/uL   Basophils Absolute 0.1 0.0 - 0.1 K/uL  LDL cholesterol, direct  Result Value Ref Range   Direct LDL 119.0 mg/dL      Assessment & Plan:   Problem List Items Addressed This Visit    Advanced care planning/counseling discussion    Advanced directive - has living will at home. HCPOA is daughter [name]. Will bring me copy for our  chart.      CKD stage 3 due to type 2 diabetes mellitus ([place])    Discussed dx with patient, encouraged increased water intake.       Essential hypertension    Chronic stable. Continue current regimen.      Health maintenance examination    Preventative protocols reviewed and updated unless pt declined. Discussed healthy diet and lifestyle.       HLD (hyperlipidemia)    Chronic, deteriorated. Continue current regimen. Discussed high triglycerides and diet changes to improve readings.       Leg pain, bilateral    Good pulses. Possible neurogenic claudication - has f/u with back doctor next month.  Gabapentin may be causing dizziness - will trial off and update me with effect.       Medicare annual wellness visit, subsequent - Primary    I have personally reviewed the Medicare Annual Wellness questionnaire and have noted 1. The patient's medical and social history 2. Their use of alcohol, tobacco or illicit drugs 3. Their current medications and supplements 4. The patient's functional ability including ADL's, fall risks, home safety risks and hearing or visual impairment. Cognitive function has been assessed and addressed as indicated.  5. Diet and physical activity 6. Evidence for depression or mood disorders The patients weight, height, BMI have been recorded in the chart. I have made referrals, counseling and provided education to the patient based on review of the above and I have provided the pt with a written personalized care plan for preventive services. Provider list updated.. See scanned questionairre as needed for further documentation. Reviewed preventative protocols and updated unless pt declined.       Osteopenia    Reviewed vit D dosing.      PMR (polymyalgia rheumatica) ([facility])    ESR stable.      Uncontrolled type 2 diabetes mellitus with nephropathy ([facility])    Followed by endo Dr Chalmers Cater. No changes made today.  Foot exam today.        Other Visit  Diagnoses    Need for influenza vaccination       Relevant Orders   Flu Vaccine QUAD 36+ mos PF IM (Fluarix & Fluzone Quad PF) (Completed)       Follow up plan: Return in about 6 months (around 08/01/2016), or as needed, for follow up visit.  [name], MD

## 2016-01-30 NOTE — Assessment & Plan Note (Signed)
ESR stable.

## 2016-01-30 NOTE — Assessment & Plan Note (Signed)
Discussed dx with patient, encouraged increased water intake.

## 2016-01-30 NOTE — Assessment & Plan Note (Signed)
Reviewed vit D dosing.

## 2016-01-30 NOTE — Patient Instructions (Addendum)
Trial off gabapentin to see if dizziness improves. Update us with effect.  Goal blood pressure 120-140/70-90. Keep an eye on this at home, let us know if consistently high or low.  Bring me copy of your living will to update your chart.  Triglycerides were too high. Decrease added sugars, eliminate trans fats, increase fiber and limit alcohol.  All these changes together can drop triglycerides by almost 50%.  You are doing well today. Return as needed or in 6 months for follow up visit.   Health Maintenance, Female Adopting a healthy lifestyle and getting preventive care can go a long way to promote health and wellness. Talk with your health care provider about what schedule of regular examinations is right for you. This is a good chance for you to check in with your provider about disease prevention and staying healthy. In between checkups, there are plenty of things you can do on your own. Experts have done a lot of research about which lifestyle changes and preventive measures are most likely to keep you healthy. Ask your health care provider for more information. WEIGHT AND DIET  Eat a healthy diet  Be sure to include plenty of vegetables, fruits, low-fat dairy products, and lean protein.  Do not eat a lot of foods high in solid fats, added sugars, or salt.  Get regular exercise. This is one of the most important things you can do for your health.  Most adults should exercise for at least 150 minutes each week. The exercise should increase your heart rate and make you sweat (moderate-intensity exercise).  Most adults should also do strengthening exercises at least twice a week. This is in addition to the moderate-intensity exercise.  Maintain a healthy weight  Body mass index (BMI) is a measurement that can be used to identify possible weight problems. It estimates body fat based on height and weight. Your health care provider can help determine your BMI and help you achieve or maintain a  healthy weight.  For females 20 years of age and older:   A BMI below 18.5 is considered underweight.  A BMI of 18.5 to 24.9 is normal.  A BMI of 25 to 29.9 is considered overweight.  A BMI of 30 and above is considered obese.  Watch levels of cholesterol and blood lipids  You should start having your blood tested for lipids and cholesterol at 80 years of age, then have this test every 5 years.  You may need to have your cholesterol levels checked more often if:  Your lipid or cholesterol levels are high.  You are older than 80 years of age.  You are at high risk for heart disease.  CANCER SCREENING   Lung Cancer  Lung cancer screening is recommended for adults 55-80 years old who are at high risk for lung cancer because of a history of smoking.  A yearly low-dose CT scan of the lungs is recommended for people who:  Currently smoke.  Have quit within the past 15 years.  Have at least a 30-pack-year history of smoking. A pack year is smoking an average of one pack of cigarettes a day for 1 year.  Yearly screening should continue until it has been 15 years since you quit.  Yearly screening should stop if you develop a health problem that would prevent you from having lung cancer treatment.  Breast Cancer  Practice breast self-awareness. This means understanding how your breasts normally appear and feel.  It also means doing regular   breast self-exams. Let your health care provider know about any changes, no matter how small.  If you are in your 20s or 30s, you should have a clinical breast exam (CBE) by a health care provider every 1-3 years as part of a regular health exam.  If you are 63 or older, have a CBE every year. Also consider having a breast X-ray (mammogram) every year.  If you have a family history of breast cancer, talk to your health care provider about genetic screening.  If you are at high risk for breast cancer, talk to your health care provider  about having an MRI and a mammogram every year.  Breast cancer gene (BRCA) assessment is recommended for women who have family members with BRCA-related cancers. BRCA-related cancers include:  Breast.  Ovarian.  Tubal.  Peritoneal cancers.  Results of the assessment will determine the need for genetic counseling and BRCA1 and BRCA2 testing. Cervical Cancer Your health care provider may recommend that you be screened regularly for cancer of the pelvic organs (ovaries, uterus, and vagina). This screening involves a pelvic examination, including checking for microscopic changes to the surface of your cervix (Pap test). You may be encouraged to have this screening done every 3 years, beginning at age 36.  For women ages 10-65, health care providers may recommend pelvic exams and Pap testing every 3 years, or they may recommend the Pap and pelvic exam, combined with testing for human papilloma virus (HPV), every 5 years. Some types of HPV increase your risk of cervical cancer. Testing for HPV may also be done on women of any age with unclear Pap test results.  Other health care providers may not recommend any screening for nonpregnant women who are considered low risk for pelvic cancer and who do not have symptoms. Ask your health care provider if a screening pelvic exam is right for you.  If you have had past treatment for cervical cancer or a condition that could lead to cancer, you need Pap tests and screening for cancer for at least 20 years after your treatment. If Pap tests have been discontinued, your risk factors (such as having a new sexual partner) need to be reassessed to determine if screening should resume. Some women have medical problems that increase the chance of getting cervical cancer. In these cases, your health care provider may recommend more frequent screening and Pap tests. Colorectal Cancer  This type of cancer can be detected and often prevented.  Routine colorectal  cancer screening usually begins at 80 years of age and continues through 80 years of age.  Your health care provider may recommend screening at an earlier age if you have risk factors for colon cancer.  Your health care provider may also recommend using home test kits to check for hidden blood in the stool.  A small camera at the end of a tube can be used to examine your colon directly (sigmoidoscopy or colonoscopy). This is done to check for the earliest forms of colorectal cancer.  Routine screening usually begins at age 63.  Direct examination of the colon should be repeated every 5-10 years through 80 years of age. However, you may need to be screened more often if early forms of precancerous polyps or small growths are found. Skin Cancer  Check your skin from head to toe regularly.  Tell your health care provider about any new moles or changes in moles, especially if there is a change in a mole's shape or color.  Also tell your health care provider if you have a mole that is larger than the size of a pencil eraser.  Always use sunscreen. Apply sunscreen liberally and repeatedly throughout the day.  Protect yourself by wearing long sleeves, pants, a wide-brimmed hat, and sunglasses whenever you are outside. HEART DISEASE, DIABETES, AND HIGH BLOOD PRESSURE   High blood pressure causes heart disease and increases the risk of stroke. High blood pressure is more likely to develop in:  People who have blood pressure in the high end of the normal range (130-139/85-89 mm Hg).  People who are overweight or obese.  People who are African American.  If you are 18-39 years of age, have your blood pressure checked every 3-5 years. If you are 40 years of age or older, have your blood pressure checked every year. You should have your blood pressure measured twice--once when you are at a hospital or clinic, and once when you are not at a hospital or clinic. Record the average of the two  measurements. To check your blood pressure when you are not at a hospital or clinic, you can use:  An automated blood pressure machine at a pharmacy.  A home blood pressure monitor.  If you are between 55 years and 79 years old, ask your health care provider if you should take aspirin to prevent strokes.  Have regular diabetes screenings. This involves taking a blood sample to check your fasting blood sugar level.  If you are at a normal weight and have a low risk for diabetes, have this test once every three years after 80 years of age.  If you are overweight and have a high risk for diabetes, consider being tested at a younger age or more often. PREVENTING INFECTION  Hepatitis B  If you have a higher risk for hepatitis B, you should be screened for this virus. You are considered at high risk for hepatitis B if:  You were born in a country where hepatitis B is common. Ask your health care provider which countries are considered high risk.  Your parents were born in a high-risk country, and you have not been immunized against hepatitis B (hepatitis B vaccine).  You have HIV or AIDS.  You use needles to inject street drugs.  You live with someone who has hepatitis B.  You have had sex with someone who has hepatitis B.  You get hemodialysis treatment.  You take certain medicines for conditions, including cancer, organ transplantation, and autoimmune conditions. Hepatitis C  Blood testing is recommended for:  Everyone born from 1945 through 1965.  Anyone with known risk factors for hepatitis C. Sexually transmitted infections (STIs)  You should be screened for sexually transmitted infections (STIs) including gonorrhea and chlamydia if:  You are sexually active and are younger than 80 years of age.  You are older than 80 years of age and your health care provider tells you that you are at risk for this type of infection.  Your sexual activity has changed since you were  last screened and you are at an increased risk for chlamydia or gonorrhea. Ask your health care provider if you are at risk.  If you do not have HIV, but are at risk, it may be recommended that you take a prescription medicine daily to prevent HIV infection. This is called pre-exposure prophylaxis (PrEP). You are considered at risk if:  You are sexually active and do not regularly use condoms or know the HIV status of your   partner(s).  You take drugs by injection.  You are sexually active with a partner who has HIV. Talk with your health care provider about whether you are at high risk of being infected with HIV. If you choose to begin PrEP, you should first be tested for HIV. You should then be tested every 3 months for as long as you are taking PrEP.  PREGNANCY   If you are premenopausal and you may become pregnant, ask your health care provider about preconception counseling.  If you may become pregnant, take 400 to 800 micrograms (mcg) of folic acid every day.  If you want to prevent pregnancy, talk to your health care provider about birth control (contraception). OSTEOPOROSIS AND MENOPAUSE   Osteoporosis is a disease in which the bones lose minerals and strength with aging. This can result in serious bone fractures. Your risk for osteoporosis can be identified using a bone density scan.  If you are 56 years of age or older, or if you are at risk for osteoporosis and fractures, ask your health care provider if you should be screened.  Ask your health care provider whether you should take a calcium or vitamin D supplement to lower your risk for osteoporosis.  Menopause may have certain physical symptoms and risks.  Hormone replacement therapy may reduce some of these symptoms and risks. Talk to your health care provider about whether hormone replacement therapy is right for you.  HOME CARE INSTRUCTIONS   Schedule regular health, dental, and eye exams.  Stay current with your  immunizations.   Do not use any tobacco products including cigarettes, chewing tobacco, or electronic cigarettes.  If you are pregnant, do not drink alcohol.  If you are breastfeeding, limit how much and how often you drink alcohol.  Limit alcohol intake to no more than 1 drink per day for nonpregnant women. One drink equals 12 ounces of beer, 5 ounces of wine, or 1 ounces of hard liquor.  Do not use street drugs.  Do not share needles.  Ask your health care provider for help if you need support or information about quitting drugs.  Tell your health care provider if you often feel depressed.  Tell your health care provider if you have ever been abused or do not feel safe at home.   This information is not intended to replace advice given to you by your health care provider. Make sure you discuss any questions you have with your health care provider.   Document Released: 12/04/2010 Document Revised: 06/11/2014 Document Reviewed: 04/22/2013 Elsevier Interactive Patient Education [employer].

## 2016-01-30 NOTE — Assessment & Plan Note (Addendum)
Chronic, deteriorated. Continue current regimen. Discussed high triglycerides and diet changes to improve readings.

## 2016-01-30 NOTE — Progress Notes (Signed)
Pre visit review using our clinic review tool, if applicable. No additional management support is needed unless otherwise documented below in the visit note. 

## 2016-01-30 NOTE — Assessment & Plan Note (Signed)
Advanced directive - has living will at home. HCPOA is daughter Taylor Castaneda. Will bring me copy for our chart. 

## 2016-01-30 NOTE — Assessment & Plan Note (Signed)
Followed by endo Dr Chalmers Cater. No changes made today. Foot exam today.

## 2016-01-30 NOTE — Assessment & Plan Note (Signed)
Chronic stable. Continue current regimen.

## 2016-01-30 NOTE — Assessment & Plan Note (Signed)
Preventative protocols reviewed and updated unless pt declined. Discussed healthy diet and lifestyle.  

## 2016-02-16 DIAGNOSIS — M5136 Other intervertebral disc degeneration, lumbar region: Secondary | ICD-10-CM | POA: Diagnosis not present

## 2016-02-16 DIAGNOSIS — M79604 Pain in right leg: Secondary | ICD-10-CM | POA: Diagnosis not present

## 2016-02-25 DIAGNOSIS — M79604 Pain in right leg: Secondary | ICD-10-CM | POA: Diagnosis not present

## 2016-02-27 ENCOUNTER — Other Ambulatory Visit: Payer: Self-pay | Admitting: Family Medicine

## 2016-02-29 DIAGNOSIS — E1165 Type 2 diabetes mellitus with hyperglycemia: Secondary | ICD-10-CM | POA: Diagnosis not present

## 2016-03-05 DIAGNOSIS — M4807 Spinal stenosis, lumbosacral region: Secondary | ICD-10-CM | POA: Diagnosis not present

## 2016-03-05 DIAGNOSIS — M79604 Pain in right leg: Secondary | ICD-10-CM | POA: Diagnosis not present

## 2016-03-05 DIAGNOSIS — M5136 Other intervertebral disc degeneration, lumbar region: Secondary | ICD-10-CM | POA: Diagnosis not present

## 2016-03-27 DIAGNOSIS — M4807 Spinal stenosis, lumbosacral region: Secondary | ICD-10-CM | POA: Diagnosis not present

## 2016-04-09 ENCOUNTER — Other Ambulatory Visit: Payer: Self-pay | Admitting: Family Medicine

## 2016-04-10 DIAGNOSIS — E1165 Type 2 diabetes mellitus with hyperglycemia: Secondary | ICD-10-CM | POA: Diagnosis not present

## 2016-05-01 DIAGNOSIS — G609 Hereditary and idiopathic neuropathy, unspecified: Secondary | ICD-10-CM | POA: Diagnosis not present

## 2016-05-01 DIAGNOSIS — E7800 Pure hypercholesterolemia, unspecified: Secondary | ICD-10-CM | POA: Diagnosis not present

## 2016-05-01 DIAGNOSIS — E1165 Type 2 diabetes mellitus with hyperglycemia: Secondary | ICD-10-CM | POA: Diagnosis not present

## 2016-05-01 DIAGNOSIS — I10 Essential (primary) hypertension: Secondary | ICD-10-CM | POA: Diagnosis not present

## 2016-05-01 DIAGNOSIS — R809 Proteinuria, unspecified: Secondary | ICD-10-CM | POA: Diagnosis not present

## 2016-05-08 DIAGNOSIS — E1165 Type 2 diabetes mellitus with hyperglycemia: Secondary | ICD-10-CM | POA: Diagnosis not present

## 2016-05-21 DIAGNOSIS — E1165 Type 2 diabetes mellitus with hyperglycemia: Secondary | ICD-10-CM | POA: Diagnosis not present

## 2016-06-13 ENCOUNTER — Encounter: Payer: Self-pay | Admitting: Family Medicine

## 2016-06-13 ENCOUNTER — Ambulatory Visit (INDEPENDENT_AMBULATORY_CARE_PROVIDER_SITE_OTHER): Payer: Medicare Other | Admitting: Family Medicine

## 2016-06-13 VITALS — BP 136/60 | HR 62 | Temp 97.6°F | Wt 135.5 lb

## 2016-06-13 DIAGNOSIS — M79605 Pain in left leg: Secondary | ICD-10-CM | POA: Diagnosis not present

## 2016-06-13 DIAGNOSIS — M79604 Pain in right leg: Secondary | ICD-10-CM | POA: Diagnosis not present

## 2016-06-13 DIAGNOSIS — L853 Xerosis cutis: Secondary | ICD-10-CM | POA: Diagnosis not present

## 2016-06-13 DIAGNOSIS — R0600 Dyspnea, unspecified: Secondary | ICD-10-CM

## 2016-06-13 DIAGNOSIS — Z8673 Personal history of transient ischemic attack (TIA), and cerebral infarction without residual deficits: Secondary | ICD-10-CM | POA: Insufficient documentation

## 2016-06-13 DIAGNOSIS — I639 Cerebral infarction, unspecified: Secondary | ICD-10-CM | POA: Insufficient documentation

## 2016-06-13 LAB — BASIC METABOLIC PANEL
BUN: 33 mg/dL — ABNORMAL HIGH (ref 6–23)
CHLORIDE: 106 meq/L (ref 96–112)
CO2: 24 meq/L (ref 19–32)
Calcium: 9.3 mg/dL (ref 8.4–10.5)
Creatinine, Ser: 1.55 mg/dL — ABNORMAL HIGH (ref 0.40–1.20)
GFR: 34.03 mL/min — ABNORMAL LOW (ref >60.00)
GLUCOSE: 149 mg/dL — AB (ref 70–99)
Potassium: 4.7 mEq/L (ref 3.5–5.1)
SODIUM: 142 meq/L (ref 135–145)

## 2016-06-13 LAB — MAGNESIUM: Magnesium: 2.2 mg/dL (ref 1.5–2.5)

## 2016-06-13 LAB — CK: CK TOTAL: 84 U/L (ref 7–177)

## 2016-06-13 NOTE — Assessment & Plan Note (Signed)
Overall stable pulses on exam today but will proceed with arterial evaluation given age and comorbidities.  Check CPK, Mg, K today.  Pt agrees with plan.

## 2016-06-13 NOTE — Progress Notes (Signed)
BP 136/60   Pulse 62   Temp 97.6 F (36.4 C) (Oral)   Wt 135 lb 8 oz (61.5 kg)   SpO2 98%   BMI 26.24 kg/m    CC: bilateral leg pain Subjective:    Patient ID: Taylor Castaneda, female    DOB: Oct 11, 1932, 83 y.o.   MRN: [id number]  HPI: Taylor Castaneda is a 83 y.o. female presenting on 06/13/2016 for Leg Pain (both legs hurt and itch all the time when up walking or standing)   Main concern today is 1 year history bilateral leg pain most noticeable at onset of walking. Sitting and resting improves pain. She also notices pain from L hip to knee with prolonged driving. Describes dull ache from knees down to toes, not localized to calves. 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 Mild back pain but not too significant. Some leg cramps - relieved with OTC remedy.   Water aerobics 3x/wk - no leg pain with this exercise  Noticing leg itching as well over last 2-3 months - constantly using benadryl anti itch cream and OTC cortisone-10.  She had ortho evaluation for this (Dr Reynaldo Minium at [facility] ortho) s/p steroid injections into knees - with 2 wks of improvement. Then had ESI of lumbar spine with 1 month relief.   Off and on dyspnea with exertion. No chest pain, palpitations, or headaches/lightheadedness. Pt states she had normal workup 2 years ago. Fluid pill does help with this.   Relevant past medical, surgical, family and social history reviewed and updated as indicated. Interim medical history since our last visit reviewed. Allergies and medications reviewed and updated. Current Outpatient Prescriptions on File Prior to Visit  Medication Sig  . amLODipine (NORVASC) 2.5 MG tablet TAKE 1 TABLET (2.5 MG TOTAL) BY MOUTH DAILY.  Marland Kitchen aspirin 81 MG tablet Take 81 mg by mouth daily.    Marland Kitchen atorvastatin (LIPITOR) 40 MG tablet TAKE 1/2 TABLET BY MOUTH EVERY DAY AT 6PM  . B-D ULTRAFINE III SHORT PEN 31G X 8 MM MISC USE AS DIRECTED WITH INSULIN  . Biotin (BIOTIN 5000) 5 MG CAPS Take 1  capsule by mouth daily.  . carvedilol (COREG) 25 MG tablet TAKE ONE TABLET BY MOUTH TWICE DAILY  . Cholecalciferol (VITAMIN D-3 PO) Take 2,000 Units by mouth.  . furosemide (LASIX) 40 MG tablet TAKE ONE TABLET BY MOUTH ONCE DAILY  . glucose blood (FREESTYLE LITE) test strip Use as instructed  . insulin glargine (LANTUS SOLOSTAR) 100 UNIT/ML injection Inject 45 Units into the skin at bedtime. KX'FG by Dr.Balan  . insulin lispro (HUMALOG KWIKPEN) 100 UNIT/ML injection Inject 10 Units into the skin 3 (three) times daily before meals.   Marland Kitchen KLOR-CON M10 10 MEQ tablet TAKE 1 TABLET (10 MEQ TOTAL) BY MOUTH EVERY MONDAY, WEDNESDAY, AND FRIDAY.  . Multiple Vitamins-Minerals (PRESERVISION AREDS 2) CAPS Take 1 capsule by mouth daily.  . naproxen sodium (ANAPROX) 220 MG tablet Take 220 mg by mouth as needed.  . ramipril (ALTACE) 10 MG capsule Take 1 capsule (10 mg total) by mouth daily.  . vitamin B-12 (CYANOCOBALAMIN) 250 MCG tablet Take 250 mcg by mouth daily.  Marland Kitchen gabapentin (NEURONTIN) 100 MG capsule Take 100 mg by mouth at bedtime.   No current facility-administered medications on file prior to visit.     Review of Systems Per HPI unless specifically indicated in ROS section     Objective:    BP 136/60   Pulse 62  Temp 97.6 F (36.4 C) (Oral)   Wt 135 lb 8 oz (61.5 kg)   SpO2 98%   BMI 26.24 kg/m   Wt Readings from Last 3 Encounters:  06/13/16 135 lb 8 oz (61.5 kg)  01/30/16 125 lb 12 oz (57 kg)  01/19/15 147 lb 12 oz (67 kg)    Physical Exam  Constitutional: She is oriented to person, place, and time. She appears well-developed and well-nourished. No distress.  HENT:  Mouth/Throat: Oropharynx is clear and moist. No oropharyngeal exudate.  Neck: Normal range of motion. Neck supple. No thyromegaly present.  Cardiovascular: Normal rate, regular rhythm, normal heart sounds and intact distal pulses.   No murmur heard. Pulmonary/Chest: Effort normal and breath sounds normal. No  respiratory distress. She has no wheezes. She has no rales.  Musculoskeletal: She exhibits no edema.  2+ DP on right, 1+ DP on left FROM at knees without crepitus No popliteal fullness  Lymphadenopathy:    She has no cervical adenopathy.  Neurological: She is alert and oriented to person, place, and time.  Skin: Skin is warm and dry. No rash noted.  Excoriations along bilateral legs  Psychiatric: She has a normal mood and affect.  Nursing note and vitals reviewed.  Results for orders placed or performed in visit on 01/23/16  Lipid panel  Result Value Ref Range   Cholesterol 200 0 - 200 mg/dL   Triglycerides 318.0 (H) 0.0 - 149.0 mg/dL   HDL 35.80 (L) >39.00 mg/dL   VLDL 63.6 (H) 0.0 - 40.0 mg/dL   Total CHOL/HDL Ratio 6    NonHDL 164.06   Hemoglobin A1c  Result Value Ref Range   Hgb A1c MFr Bld 8.3 (H) 4.6 - 6.5 %  Renal function panel  Result Value Ref Range   Sodium 140 135 - 145 mEq/L   Potassium 4.2 3.5 - 5.1 mEq/L   Chloride 104 96 - 112 mEq/L   CO2 26 19 - 32 mEq/L   Calcium 9.3 8.4 - 10.5 mg/dL   Albumin 4.2 3.5 - 5.2 g/dL   BUN 45 (H) 6 - 23 mg/dL   Creatinine, Ser 1.61 (H) 0.40 - 1.20 mg/dL   Glucose, Bld 81 70 - 99 mg/dL   Phosphorus 4.6 2.3 - 4.6 mg/dL   GFR 32.60 (L) >60.00 mL/min  Sedimentation rate  Result Value Ref Range   Sed Rate 27 0 - 30 mm/hr  CBC with Differential/Platelet  Result Value Ref Range   WBC 8.1 4.0 - 10.5 K/uL   RBC 4.25 3.87 - 5.11 Mil/uL   Hemoglobin 13.5 12.0 - 15.0 g/dL   HCT 39.7 36.0 - 46.0 %   MCV 93.2 78.0 - 100.0 fl   MCHC 34.0 30.0 - 36.0 g/dL   RDW 13.7 11.5 - 15.5 %   Platelets 174.0 150.0 - 400.0 K/uL   Neutrophils Relative % 41.7 (L) 43.0 - 77.0 %   Lymphocytes Relative 43.5 12.0 - 46.0 %   Monocytes Relative 11.2 3.0 - 12.0 %   Eosinophils Relative 2.7 0.0 - 5.0 %   Basophils Relative 0.9 0.0 - 3.0 %   Neutro Abs 3.4 1.4 - 7.7 K/uL   Lymphs Abs 3.5 0.7 - 4.0 K/uL   Monocytes Absolute 0.9 0.1 - 1.0 K/uL    Eosinophils Absolute 0.2 0.0 - 0.7 K/uL   Basophils Absolute 0.1 0.0 - 0.1 K/uL  LDL cholesterol, direct  Result Value Ref Range   Direct LDL 119.0 mg/dL  Assessment & Plan:   Problem List Items Addressed This Visit    Dry skin dermatitis    Discussed lukewarm shower and moisturizer cream use.       Dyspnea    Exam WNL today. Check ambulatory pulse ox today.  Previously thought pulm restriction + deconditioning.       Leg pain, bilateral - Primary    Overall stable pulses on exam today but will proceed with arterial evaluation given age and comorbidities.  Check CPK, Mg, K today.  Pt agrees with plan.      Relevant Orders   Basic metabolic panel   Magnesium   VAS [place] LE ART SEG MULTI (Segm&LE Reynauds)   CK       Follow up plan: Return if symptoms worsen or fail to improve.  [name], MD

## 2016-06-13 NOTE — Assessment & Plan Note (Signed)
Discussed lukewarm shower and moisturizer cream use.

## 2016-06-13 NOTE — Assessment & Plan Note (Addendum)
Exam WNL today. Check ambulatory pulse ox today.  Previously thought pulm restriction + deconditioning.

## 2016-06-13 NOTE — Patient Instructions (Addendum)
Labs today. We will check arterial circulation Regularly use moisturizing cream like aveeno or eucerin twice daily, especially after showering Ambulatory pulse ox today to ensure oxygen level is ok.

## 2016-06-13 NOTE — Progress Notes (Signed)
Pre visit review using our clinic review tool, if applicable. No additional management support is needed unless otherwise documented below in the visit note. 

## 2016-06-14 ENCOUNTER — Other Ambulatory Visit: Payer: Self-pay | Admitting: Family Medicine

## 2016-06-18 ENCOUNTER — Encounter: Payer: Self-pay | Admitting: *Deleted

## 2016-06-26 ENCOUNTER — Other Ambulatory Visit: Payer: Self-pay | Admitting: Family Medicine

## 2016-06-26 DIAGNOSIS — I739 Peripheral vascular disease, unspecified: Secondary | ICD-10-CM

## 2016-06-27 DIAGNOSIS — H04123 Dry eye syndrome of bilateral lacrimal glands: Secondary | ICD-10-CM | POA: Diagnosis not present

## 2016-06-27 DIAGNOSIS — H21512 Anterior synechiae (iris), left eye: Secondary | ICD-10-CM | POA: Diagnosis not present

## 2016-06-27 DIAGNOSIS — H402232 Chronic angle-closure glaucoma, bilateral, moderate stage: Secondary | ICD-10-CM | POA: Diagnosis not present

## 2016-06-27 DIAGNOSIS — H02403 Unspecified ptosis of bilateral eyelids: Secondary | ICD-10-CM | POA: Diagnosis not present

## 2016-07-04 ENCOUNTER — Ambulatory Visit (HOSPITAL_COMMUNITY)
Admission: RE | Admit: 2016-07-04 | Discharge: 2016-07-04 | Disposition: A | Discharge status: Home / Self Care | Payer: Medicare Other | Source: Ambulatory Visit | Attending: Cardiovascular Disease | Admitting: Cardiovascular Disease

## 2016-07-04 DIAGNOSIS — I739 Peripheral vascular disease, unspecified: Secondary | ICD-10-CM | POA: Diagnosis not present

## 2016-07-05 ENCOUNTER — Encounter: Payer: Self-pay | Admitting: Family Medicine

## 2016-07-05 ENCOUNTER — Other Ambulatory Visit: Payer: Self-pay | Admitting: Family Medicine

## 2016-07-05 DIAGNOSIS — I739 Peripheral vascular disease, unspecified: Secondary | ICD-10-CM

## 2016-07-17 DIAGNOSIS — E1165 Type 2 diabetes mellitus with hyperglycemia: Secondary | ICD-10-CM | POA: Diagnosis not present

## 2016-07-20 ENCOUNTER — Ambulatory Visit: Payer: Medicare Other | Admitting: Internal Medicine

## 2016-07-20 ENCOUNTER — Encounter: Payer: Self-pay | Admitting: Internal Medicine

## 2016-07-20 ENCOUNTER — Ambulatory Visit (INDEPENDENT_AMBULATORY_CARE_PROVIDER_SITE_OTHER)
Admission: RE | Admit: 2016-07-20 | Discharge: 2016-07-20 | Disposition: A | Discharge status: Home / Self Care | Payer: Medicare Other | Source: Ambulatory Visit | Attending: Internal Medicine | Admitting: Internal Medicine

## 2016-07-20 VITALS — BP 102/60 | HR 75 | Temp 97.9°F

## 2016-07-20 DIAGNOSIS — M79672 Pain in left foot: Secondary | ICD-10-CM

## 2016-07-20 DIAGNOSIS — S99922A Unspecified injury of left foot, initial encounter: Secondary | ICD-10-CM | POA: Diagnosis not present

## 2016-07-20 NOTE — Progress Notes (Signed)
Subjective:    Patient ID: Taylor Castaneda, female    DOB: [birth year], 83 y.o.   MRN: [id number]  HPI Here due to fall and increasing pain Son [name] here with her  [address] in kitchen yesterday AM Sugar was low-- 60. Then drank something (didn't take humalog) Doesn't really remember what happened--fell on left side No LOC Able to get up on her own--seemed to be okay Went out to get hair done---- then "leg gave out" Seemed to be the whole leg--not knee in particular Got home and couldn't stand on it  Pain in knee but more in foot Some swelling--looks bruised Iced every 3 hours yesterday  Current Outpatient Prescriptions on File Prior to Visit  Medication Sig Dispense Refill  . amLODipine (NORVASC) 2.5 MG tablet TAKE 1 TABLET (2.5 MG TOTAL) BY MOUTH DAILY. 30 tablet 11  . aspirin 81 MG tablet Take 81 mg by mouth daily.      Marland Kitchen atorvastatin (LIPITOR) 40 MG tablet TAKE 1/2 TABLET BY MOUTH EVERY DAY AT 6PM 45 tablet 0  . B-D ULTRAFINE III SHORT PEN 31G X 8 MM MISC USE AS DIRECTED WITH INSULIN 100 each 3  . Biotin (BIOTIN 5000) 5 MG CAPS Take 1 capsule by mouth daily.    . carvedilol (COREG) 25 MG tablet TAKE ONE TABLET BY MOUTH TWICE DAILY 180 tablet 2  . Cholecalciferol (VITAMIN D-3 PO) Take 2,000 Units by mouth.    . furosemide (LASIX) 40 MG tablet TAKE ONE TABLET BY MOUTH ONCE DAILY 90 tablet 1  . glucose blood (FREESTYLE LITE) test strip Use as instructed 100 each 2  . insulin glargine (LANTUS SOLOSTAR) 100 UNIT/ML injection Inject 45 Units into the skin at bedtime. OX'BD by Dr.Balan    . insulin lispro (HUMALOG KWIKPEN) 100 UNIT/ML injection Inject 10 Units into the skin 3 (three) times daily before meals.     Marland Kitchen KLOR-CON M10 10 MEQ tablet TAKE ONE TABLET BY MOUTH ON MONDAY, WEDNESDAY, AND FRIDAY 36 tablet 3  . Multiple Vitamins-Minerals (PRESERVISION AREDS 2) CAPS Take 1 capsule by mouth daily.    . naproxen sodium (ANAPROX) 220 MG tablet Take 220 mg by mouth as needed.    . ramipril  (ALTACE) 10 MG capsule Take 1 capsule (10 mg total) by mouth daily. 90 capsule 0   No current facility-administered medications on file prior to visit.     Allergies  Allergen Reactions  . Pravastatin Sodium     REACTION: anemia as per Dr Gilford Rile, [facility]    Past Medical History:  Diagnosis Date  . Anemia   . Diabetes mellitus, type II ([place])   . Glaucoma    Dr.Hecker  . History of colon cancer 1998   Dr. Lennie Hummer  . Hyperlipemia   . Osteopenia 02/2012, 03/2014   DEXA hip -2.2  . Renal insufficiency   . Stenosing tenosynovitis of finger of left hand 2016   index - s/p steroid injection x2    Past Surgical History:  Procedure Laterality Date  . ABI  11/2013   WNL  . APPENDECTOMY    . CARPAL TUNNEL RELEASE     Bilateral   . CATARACT EXTRACTION Bilateral 2006   Implants  . COLECTOMY  1998   For cancer  . COLONOSCOPY  2008   Dr.Stark, Due 2011  . COLONOSCOPY  06/2013   1 hyperplastic polyp, ileocecal anastomosis [facility])  . dexa  02/2012, 03/2014   T score -2.2 at hip overall stable  .  REFRACTIVE SURGERY    . TONSILLECTOMY AND ADENOIDECTOMY      Family History  Problem Relation Age of Onset  . Heart attack Father 58  . Stroke Father 44  . Diabetes Father   . Hypertension Father   . Heart disease Father     before age 4  . Breast cancer Mother   . Cancer Mother   . Diabetes Sister   . Cancer Sister   . Peripheral vascular disease Sister   . Breast cancer Sister   . COPD Neg Hx   . Asthma Neg Hx   . Colon cancer Neg Hx   . Esophageal cancer Neg Hx   . Rectal cancer Neg Hx   . Stomach cancer Neg Hx   . Hypertension Son     Social History   Social History  . Marital status: Widowed    Spouse name: N/A  . Number of children: 4  . Years of education: N/A   Occupational History  . retired Retired    Regulatory affairs officer   Social History Main Topics  . Smoking status: Former Smoker    Years: 2.00    Types: Cigarettes    Quit date: 06/04/1973  . Smokeless  tobacco: Never Used     Comment: Smoked for 5 years 1965-1970 up to 1/2 pp week  . Alcohol use No     Comment: Wine once in a while  . Drug use: No  . Sexual activity: Not on file   Other Topics Concern  . Not on file   Social History Narrative         Advanced directive - has living will at home. HCPOA is daughter [name]. Will bring me copy for our chart.   Review of Systems Still on 10 humalog before meals--takes it right before eating    Objective:   Physical Exam  Musculoskeletal:  Left knee not swollen Resists full ROM but not really tender  Bruising over distal metatarsals 2-5 on left Mild tenderness but no specific point tenderness Worst is over 2nd metatarsal          Assessment & Plan:

## 2016-07-20 NOTE — Progress Notes (Signed)
Pre visit review using our clinic review tool, if applicable. No additional management support is needed unless otherwise documented below in the visit note. 

## 2016-07-20 NOTE — Assessment & Plan Note (Addendum)
Clearly bruised Was able to get up and actually drive out to hair place---but still could be fracture X-ray negative Discussed icing for another 24 hours  Tylenol Increase activity as tolerated Wrap if helps

## 2016-07-25 ENCOUNTER — Telehealth: Payer: Self-pay

## 2016-07-25 NOTE — Telephone Encounter (Signed)
Spoke to pt

## 2016-07-25 NOTE — Telephone Encounter (Signed)
I don't think soaking will help but heat (heating pad, etc) will probably help. The redness may be from changing colors of the bruise I saw on her foot

## 2016-07-25 NOTE — Telephone Encounter (Signed)
Pt left v/m; pt was seen 07/20/16 with foot pain; now lt big toe is red on top of toe but is not hurting and pt wants to know if anything needs to be done. Is still painful to bear weight on lt foot.  Pt noticed redness on 07/24/16. Pt wants to know if should continue heat and soaking. [facility] [address]. Pt request cb.

## 2016-07-30 ENCOUNTER — Other Ambulatory Visit: Payer: Self-pay | Admitting: Family Medicine

## 2016-08-01 ENCOUNTER — Encounter: Payer: Self-pay | Admitting: Family Medicine

## 2016-08-01 ENCOUNTER — Ambulatory Visit: Payer: Medicare Other | Admitting: Family Medicine

## 2016-08-01 ENCOUNTER — Ambulatory Visit (INDEPENDENT_AMBULATORY_CARE_PROVIDER_SITE_OTHER): Payer: Medicare Other | Admitting: Family Medicine

## 2016-08-01 VITALS — BP 120/56 | HR 64 | Temp 97.4°F | Wt 131.5 lb

## 2016-08-01 DIAGNOSIS — E1121 Type 2 diabetes mellitus with diabetic nephropathy: Secondary | ICD-10-CM | POA: Diagnosis not present

## 2016-08-01 DIAGNOSIS — I739 Peripheral vascular disease, unspecified: Secondary | ICD-10-CM

## 2016-08-01 DIAGNOSIS — E1165 Type 2 diabetes mellitus with hyperglycemia: Secondary | ICD-10-CM

## 2016-08-01 DIAGNOSIS — M79672 Pain in left foot: Secondary | ICD-10-CM | POA: Diagnosis not present

## 2016-08-01 DIAGNOSIS — N183 Chronic kidney disease, stage 3 unspecified: Secondary | ICD-10-CM

## 2016-08-01 DIAGNOSIS — E1122 Type 2 diabetes mellitus with diabetic chronic kidney disease: Secondary | ICD-10-CM | POA: Diagnosis not present

## 2016-08-01 DIAGNOSIS — I10 Essential (primary) hypertension: Secondary | ICD-10-CM | POA: Diagnosis not present

## 2016-08-01 DIAGNOSIS — IMO0002 Reserved for concepts with insufficient information to code with codable children: Secondary | ICD-10-CM

## 2016-08-01 NOTE — Progress Notes (Signed)
BP (!) 120/56 (BP Location: Right Arm, Cuff Size: Normal)   Pulse 64   Temp 97.4 F (36.3 C) (Oral)   Wt 131 lb 8 oz (59.6 kg)   BMI 25.47 kg/m    CC: 77mof/u visit Subjective:    Patient ID: JAYLEE LITTRELL female    DOB: [birth year] 83y.o.   MRN: [id number] HPI: JTENLEIGH BYERis a 83y.o. female presenting on 08/01/2016 for Follow-up   Here with son GDominica Severin   Seen last month by Dr LSilvio Pateafter fall with L foot injury, xray negative for fracture. Foot continues improving. Still some   Lower leg pain - ABIs showed L leg artery blockages - has been referred for vascular evaluation. Leg pain improved as she's been resting more after fall with L foot injury.   DM - followed by Dr BChalmers Cater On lantus 35u daily. Diabetic Foot Exam - Simple   Simple Foot Form Diabetic Foot exam was performed with the following findings:  Yes 08/01/2016 10:09 AM  Visual Inspection No deformities, no ulcerations, no other skin breakdown bilaterally:  Yes Sensation Testing See comments:  Yes Pulse Check Posterior Tibialis and Dorsalis pulse intact bilaterally:  Yes Comments 2+ DP on right, 1+ DP on left Diminished sensation to monofilament testing at soles      HTN - Compliant with current antihypertensive regimen of amlodipine 2.'5mg'$  daily, coreg '25mg'$  bid, lasix '40mg'$  daily, ramipril '10mg'$  daily. Does not check blood pressures at home. No low blood pressure readings or symptoms of dizziness/syncope.  Denies HA, vision changes, CP/tightness, leg swelling.  Chronic dyspnea   Relevant past medical, surgical, family and social history reviewed and updated as indicated. Interim medical history since our last visit reviewed. Allergies and medications reviewed and updated. Outpatient Medications Prior to Visit  Medication Sig Dispense Refill  . amLODipine (NORVASC) 2.5 MG tablet TAKE 1 TABLET (2.5 MG TOTAL) BY MOUTH DAILY. 30 tablet 11  . aspirin 81 MG tablet Take 81 mg by mouth daily.      .Marland Kitchenatorvastatin  (LIPITOR) 40 MG tablet TAKE 1/2 TABLET BY MOUTH EVERY DAY AT 6PM 45 tablet 0  . B-D ULTRAFINE III SHORT PEN 31G X 8 MM MISC USE AS DIRECTED WITH INSULIN 100 each 3  . Biotin (BIOTIN 5000) 5 MG CAPS Take 1 capsule by mouth daily.    . carvedilol (COREG) 25 MG tablet TAKE ONE TABLET BY MOUTH TWICE DAILY 180 tablet 2  . Cholecalciferol (VITAMIN D-3 PO) Take 2,000 Units by mouth.    . furosemide (LASIX) 40 MG tablet TAKE ONE TABLET BY MOUTH ONCE DAILY 90 tablet 1  . glucose blood (FREESTYLE LITE) test strip Use as instructed 100 each 2  . insulin glargine (LANTUS SOLOSTAR) 100 UNIT/ML injection Inject 35 Units into the skin at bedtime. RVH'QIby Dr.Balan    . insulin lispro (HUMALOG KWIKPEN) 100 UNIT/ML injection Inject 10 Units into the skin 3 (three) times daily before meals.     .Marland KitchenKLOR-CON M10 10 MEQ tablet TAKE ONE TABLET BY MOUTH ON MONDAY, WEDNESDAY, AND FRIDAY 36 tablet 3  . Multiple Vitamins-Minerals (PRESERVISION AREDS 2) CAPS Take 1 capsule by mouth daily.    . naproxen sodium (ANAPROX) 220 MG tablet Take 220 mg by mouth as needed.    . ramipril (ALTACE) 10 MG capsule TAKE ONE CAPSULE BY MOUTH ONCE DAILY 90 capsule 0   No facility-administered medications prior to visit.      Per HPI unless  specifically indicated in ROS section below Review of Systems     Objective:    BP (!) 120/56 (BP Location: Right Arm, Cuff Size: Normal)   Pulse 64   Temp 97.4 F (36.3 C) (Oral)   Wt 131 lb 8 oz (59.6 kg)   BMI 25.47 kg/m   Wt Readings from Last 3 Encounters:  08/01/16 131 lb 8 oz (59.6 kg)  06/13/16 135 lb 8 oz (61.5 kg)  01/30/16 125 lb 12 oz (57 kg)    Physical Exam  Constitutional: She appears well-developed and well-nourished. No distress.  Using walker after recent fall, planning on stopping walker as foot pain improves  HENT:  Head: Normocephalic and atraumatic.  Mouth/Throat: Oropharynx is clear and moist. No oropharyngeal exudate.  Eyes: Conjunctivae are normal. Pupils are  equal, round, and reactive to light.  Cardiovascular: Normal rate, regular rhythm, normal heart sounds and intact distal pulses.   No murmur heard. Pulmonary/Chest: Effort normal and breath sounds normal. No respiratory distress. She has no wheezes. She has no rales.  Musculoskeletal: She exhibits no edema.  Tender to palpation 2nd toe as well as at 1st MTPJ  Skin: Skin is warm and dry. No rash noted.  Resolving bruise of L sole of foot and 2nd toe  Psychiatric: She has a normal mood and affect.  Nursing note and vitals reviewed.  Results for orders placed or performed in visit on 57/32/20  Basic metabolic panel  Result Value Ref Range   Sodium 142 135 - 145 mEq/L   Potassium 4.7 3.5 - 5.1 mEq/L   Chloride 106 96 - 112 mEq/L   CO2 24 19 - 32 mEq/L   Glucose, Bld 149 (H) 70 - 99 mg/dL   BUN 33 (H) 6 - 23 mg/dL   Creatinine, Ser 1.55 (H) 0.40 - 1.20 mg/dL   Calcium 9.3 8.4 - 10.5 mg/dL   GFR 34.03 (L) >60.00 mL/min  Magnesium  Result Value Ref Range   Magnesium 2.2 1.5 - 2.5 mg/dL  CK  Result Value Ref Range   Total CK 84 7 - 177 U/L      Assessment & Plan:   Problem List Items Addressed This Visit    CKD stage 3 due to type 2 diabetes mellitus ([place])    Check labs next visit.       Essential hypertension - Primary    Chronic, stable. Continue current regimen.       Left foot pain    Anticipate bony bruise of left foot along with capsular strain of 1st MTPJ Improving.      PAD (peripheral artery disease) ([place])    Has been referred to [facility] for eval. Endorses leg pain has improved since she has rested more by necessity after recent foot injury.       Uncontrolled type 2 diabetes mellitus with nephropathy ([place])    Appreciate endo care. Foot exam today.           Follow up plan: Return in about 6 months (around 01/29/2017) for annual exam, prior fasting for blood work, medicare wellness visit.  [name], MD

## 2016-08-01 NOTE — Patient Instructions (Signed)
You are doing well today. Return as needed or in 6 months for medicare wellness visit and physical.

## 2016-08-01 NOTE — Assessment & Plan Note (Signed)
Anticipate bony bruise of left foot along with capsular strain of 1st MTPJ Improving.

## 2016-08-01 NOTE — Assessment & Plan Note (Signed)
Check labs next visit 

## 2016-08-01 NOTE — Assessment & Plan Note (Signed)
Appreciate endo care. Foot exam today.

## 2016-08-01 NOTE — Assessment & Plan Note (Signed)
Has been referred to [facility] for eval. Endorses leg pain has improved since she has rested more by necessity after recent foot injury.

## 2016-08-01 NOTE — Progress Notes (Signed)
Pre visit review using our clinic review tool, if applicable. No additional management support is needed unless otherwise documented below in the visit note. 

## 2016-08-01 NOTE — Assessment & Plan Note (Signed)
Chronic, stable. Continue current regimen. 

## 2016-08-02 ENCOUNTER — Encounter: Payer: Self-pay | Admitting: Vascular Surgery

## 2016-08-08 ENCOUNTER — Encounter: Payer: Medicare Other | Admitting: Vascular Surgery

## 2016-08-09 ENCOUNTER — Ambulatory Visit (INDEPENDENT_AMBULATORY_CARE_PROVIDER_SITE_OTHER): Payer: Medicare Other | Admitting: Vascular Surgery

## 2016-08-09 ENCOUNTER — Encounter: Payer: Self-pay | Admitting: Vascular Surgery

## 2016-08-09 VITALS — BP 132/64 | HR 62 | Temp 97.1°F | Resp 18 | Ht 60.25 in | Wt 132.6 lb

## 2016-08-09 DIAGNOSIS — I739 Peripheral vascular disease, unspecified: Secondary | ICD-10-CM | POA: Diagnosis not present

## 2016-08-09 NOTE — Progress Notes (Signed)
Patient name: Taylor Castaneda MRN: [id number] DOB: 1932-07-05 Sex: female  REASON FOR CONSULT: Peripheral vascular disease. Referred by Dr. Danise Mina.  HPI: Taylor Castaneda is a 83 y.o. female, referred for evaluation of peripheral vascular disease. She describes bilateral lower extremity claudication which is equal on both sides. Her pain is in the calf. Her symptoms are brought on by ambulation and relieved with rest. She denies any history of rest pain or nonhealing ulcers. She also has significant pain in her left knee from arthritis.  Risk factors for peripheral vascular disease include diabetes and hypertension. She denies any history of hypercholesterolemia. She is not a smoker.  Past Medical History:  Diagnosis Date  . Anemia   . Diabetes mellitus, type II ([place])   . Glaucoma    Dr.Hecker  . History of colon cancer 1998   Dr. Lennie Hummer  . Hyperlipemia   . Osteopenia 02/2012, 03/2014   DEXA hip -2.2  . Renal insufficiency   . Stenosing tenosynovitis of finger of left hand 2016   index - s/p steroid injection x2    Family History  Problem Relation Age of Onset  . Heart attack Father 39  . Stroke Father 60  . Diabetes Father   . Hypertension Father   . Heart disease Father     before age 46  . Breast cancer Mother   . Cancer Mother   . Diabetes Sister   . Cancer Sister   . Peripheral vascular disease Sister   . Breast cancer Sister   . Hypertension Son   . COPD Neg Hx   . Asthma Neg Hx   . Colon cancer Neg Hx   . Esophageal cancer Neg Hx   . Rectal cancer Neg Hx   . Stomach cancer Neg Hx     SOCIAL HISTORY: Social History   Social History  . Marital status: Widowed    Spouse name: N/A  . Number of children: 4  . Years of education: N/A   Occupational History  . retired Retired    Regulatory affairs officer   Social History Main Topics  . Smoking status: Former Smoker    Years: 2.00    Types: Cigarettes    Quit date: 06/04/1973  . Smokeless tobacco: Never Used   Comment: Smoked for 5 years 1965-1970 up to 1/2 pp week  . Alcohol use No     Comment: Wine once in a while  . Drug use: No  . Sexual activity: Not on file   Other Topics Concern  . Not on file   Social History Narrative         Advanced directive - has living will at home. HCPOA is daughter [name]. Will bring me copy for our chart.    Allergies  Allergen Reactions  . Pravastatin Sodium     REACTION: anemia as per Dr Gilford Rile, [facility]    Current Outpatient Prescriptions  Medication Sig Dispense Refill  . amLODipine (NORVASC) 2.5 MG tablet TAKE 1 TABLET (2.5 MG TOTAL) BY MOUTH DAILY. 30 tablet 11  . aspirin 81 MG tablet Take 81 mg by mouth daily.      Marland Kitchen atorvastatin (LIPITOR) 40 MG tablet TAKE 1/2 TABLET BY MOUTH EVERY DAY AT 6PM 45 tablet 0  . B-D ULTRAFINE III SHORT PEN 31G X 8 MM MISC USE AS DIRECTED WITH INSULIN 100 each 3  . Biotin (BIOTIN 5000) 5 MG CAPS Take 1 capsule by mouth daily.    . carvedilol (  COREG) 25 MG tablet TAKE ONE TABLET BY MOUTH TWICE DAILY 180 tablet 2  . Cholecalciferol (VITAMIN D-3 PO) Take 2,000 Units by mouth.    . furosemide (LASIX) 40 MG tablet TAKE ONE TABLET BY MOUTH ONCE DAILY 90 tablet 1  . glucose blood (FREESTYLE LITE) test strip Use as instructed 100 each 2  . insulin glargine (LANTUS SOLOSTAR) 100 UNIT/ML injection Inject 35 Units into the skin at bedtime. TD'DU by Dr.Balan    . insulin lispro (HUMALOG KWIKPEN) 100 UNIT/ML injection Inject 10 Units into the skin 3 (three) times daily before meals.     Marland Kitchen KLOR-CON M10 10 MEQ tablet TAKE ONE TABLET BY MOUTH ON MONDAY, WEDNESDAY, AND FRIDAY 36 tablet 3  . Multiple Vitamins-Minerals (PRESERVISION AREDS 2) CAPS Take 1 capsule by mouth daily.    . naproxen sodium (ANAPROX) 220 MG tablet Take 220 mg by mouth as needed.    . ramipril (ALTACE) 10 MG capsule TAKE ONE CAPSULE BY MOUTH ONCE DAILY 90 capsule 0   No current facility-administered medications for this visit.     REVIEW OF SYSTEMS:    '[X]'$  denotes positive finding, '[ ]'$  denotes negative finding Cardiac  Comments:  Chest pain or chest pressure:    Shortness of breath upon exertion: X   Short of breath when lying flat:    Irregular heart rhythm:        Vascular    Pain in calf, thigh, or hip brought on by ambulation: X   Pain in feet at night that wakes you up from your sleep:     Blood clot in your veins:    Leg swelling:         Pulmonary    Oxygen at home:    Productive cough:     Wheezing:         Neurologic    Sudden weakness in arms or legs:     Sudden numbness in arms or legs:     Sudden onset of difficulty speaking or slurred speech:    Temporary loss of vision in one eye:     Problems with dizziness:         Gastrointestinal    Blood in stool:     Vomited blood:         Genitourinary    Burning when urinating:     Blood in urine:        Psychiatric    Major depression:         Hematologic    Bleeding problems:    Problems with blood clotting too easily:        Skin    Rashes or ulcers:        Constitutional    Fever or chills:      PHYSICAL EXAM: Vitals:   08/09/16 0941  BP: 132/64  Pulse: 62  Resp: 18  Temp: 97.1 F (36.2 C)  TempSrc: Oral  SpO2: 99%  Weight: 132 lb 9.6 oz (60.1 kg)  Height: 5' 0.25" (1.53 m)    GENERAL: The patient is a well-nourished female, in no acute distress. The vital signs are documented above. CARDIAC: There is a regular rate and rhythm.  VASCULAR: I do not detect carotid bruits. On the right side, she has a palpable femoral, popliteal, and dorsalis pedis pulse. On the left side she has a palpable femoral pulse. I cannot palpate pedal pulses. She has no significant lower extremity swelling. PULMONARY: There is good air exchange bilaterally without  wheezing or rales. ABDOMEN: Soft and non-tender with normal pitched bowel sounds.  MUSCULOSKELETAL: There are no major deformities or cyanosis. NEUROLOGIC: No focal weakness or paresthesias are  detected. SKIN: There are no ulcers or rashes noted. PSYCHIATRIC: The patient has a normal affect.  DATA:   ARTERIAL DOPPLER STUDY: I reviewed her arterial Doppler study from 07/04/2016. She was noted to have monophasic Doppler signals in the right foot dorsalis pedis and posterior tibial positions with an ABI of 100%. Toe pressure was 149 mmHg. On the left side she had a monophasic dorsalis pedis and peroneal signal. There was no posterior tibial signal. ABI was 100%. Toe pressure was 98 mmHg. The ABIs are felt to be falsely elevated secondary to calcific disease.  ARTERIAL DUPLEX: I have reviewed her arterial duplex was done on 07/04/2016. This showed a moderate 30-49% stenosis in the right femoropopliteal segment. On the left side was a 50-75% stenosis in the tibial peroneal trunk.  MEDICAL ISSUES:  PERIPHERAL VASCULAR DISEASE: This patient has mild to moderate infrainguinal arterial occlusive disease bilaterally. She has stable claudication. Certainly given her age I would not recommend aggressive approach and I would not recommend arteriography at this point. I have encouraged her to stay as active as possible. Fortunately, she is not a smoker. I will see her back as needed.  Deitra Mayo [facility] [phone]

## 2016-08-14 DIAGNOSIS — E1165 Type 2 diabetes mellitus with hyperglycemia: Secondary | ICD-10-CM | POA: Diagnosis not present

## 2016-08-21 DIAGNOSIS — E1165 Type 2 diabetes mellitus with hyperglycemia: Secondary | ICD-10-CM | POA: Diagnosis not present

## 2016-08-21 DIAGNOSIS — I10 Essential (primary) hypertension: Secondary | ICD-10-CM | POA: Diagnosis not present

## 2016-08-21 DIAGNOSIS — R809 Proteinuria, unspecified: Secondary | ICD-10-CM | POA: Diagnosis not present

## 2016-08-21 DIAGNOSIS — E7800 Pure hypercholesterolemia, unspecified: Secondary | ICD-10-CM | POA: Diagnosis not present

## 2016-09-24 ENCOUNTER — Ambulatory Visit (INDEPENDENT_AMBULATORY_CARE_PROVIDER_SITE_OTHER): Payer: Medicare Other | Admitting: Family Medicine

## 2016-09-24 ENCOUNTER — Encounter: Payer: Self-pay | Admitting: Family Medicine

## 2016-09-24 VITALS — BP 126/60 | HR 74 | Temp 97.8°F | Wt 131.5 lb

## 2016-09-24 DIAGNOSIS — I10 Essential (primary) hypertension: Secondary | ICD-10-CM

## 2016-09-24 MED ORDER — CARVEDILOL 12.5 MG PO TABS: 12.5000 mg | ORAL_TABLET | Freq: Two times a day (BID) | ORAL | 3 refills | Status: DC

## 2016-09-24 MED ORDER — CARVEDILOL 12.5 MG PO TABS: 12.5000 mg | ORAL_TABLET | Freq: Two times a day (BID) | ORAL | 11 refills | Status: DC

## 2016-09-24 NOTE — Progress Notes (Signed)
Pre visit review using our clinic review tool, if applicable. No additional management support is needed unless otherwise documented below in the visit note. 

## 2016-09-24 NOTE — Patient Instructions (Addendum)
Let's decrease carvedilol to 12.'5mg'$  twice daily. New dose is at pharmacy.  If blood pressures are staying >160/100, may take whole carvedilol instead of half. Blood pressures today are looking good. Good to see you today, call us with questions.   DASH Eating Plan DASH stands for "Dietary Approaches to Stop Hypertension." The DASH eating plan is a healthy eating plan that has been shown to reduce high blood pressure (hypertension). It may also reduce your risk for type 2 diabetes, heart disease, and stroke. The DASH eating plan may also help with weight loss. What are tips for following this plan? General guidelines   Avoid eating more than 2,300 mg (milligrams) of salt (sodium) a day. If you have hypertension, you may need to reduce your sodium intake to 1,500 mg a day.  Limit alcohol intake to no more than 1 drink a day for nonpregnant women and 2 drinks a day for men. One drink equals 12 oz of beer, 5 oz of wine, or 1 oz of hard liquor.  Work with your health care provider to maintain a healthy body weight or to lose weight. Ask what an ideal weight is for you.  Get at least 30 minutes of exercise that causes your heart to beat faster (aerobic exercise) most days of the week. Activities may include walking, swimming, or biking.  Work with your health care provider or diet and nutrition specialist (dietitian) to adjust your eating plan to your individual calorie needs. Reading food labels   Check food labels for the amount of sodium per serving. Choose foods with less than 5 percent of the Daily Value of sodium. Generally, foods with less than 300 mg of sodium per serving fit into this eating plan.  To find whole grains, look for the word "whole" as the first word in the ingredient list. Shopping   Buy products labeled as "low-sodium" or "no salt added."  Buy fresh foods. Avoid canned foods and premade or frozen meals. Cooking   Avoid adding salt when cooking. Use salt-free  seasonings or herbs instead of table salt or sea salt. Check with your health care provider or pharmacist before using salt substitutes.  Do not fry foods. Cook foods using healthy methods such as baking, boiling, grilling, and broiling instead.  Cook with heart-healthy oils, such as olive, canola, soybean, or sunflower oil. Meal planning    Eat a balanced diet that includes:  5 or more servings of fruits and vegetables each day. At each meal, try to fill half of your plate with fruits and vegetables.  Up to 6-8 servings of whole grains each day.  Less than 6 oz of lean meat, poultry, or fish each day. A 3-oz serving of meat is about the same size as a deck of cards. One egg equals 1 oz.  2 servings of low-fat dairy each day.  A serving of nuts, seeds, or beans 5 times each week.  Heart-healthy fats. Healthy fats called Omega-3 fatty acids are found in foods such as flaxseeds and coldwater fish, like sardines, salmon, and mackerel.  Limit how much you eat of the following:  Canned or prepackaged foods.  Food that is high in trans fat, such as fried foods.  Food that is high in saturated fat, such as fatty meat.  Sweets, desserts, sugary drinks, and other foods with added sugar.  Full-fat dairy products.  Do not salt foods before eating.  Try to eat at least 2 vegetarian meals each week.  Eat more  home-cooked food and less restaurant, buffet, and fast food.  When eating at a restaurant, ask that your food be prepared with less salt or no salt, if possible. What foods are recommended? The items listed may not be a complete list. Talk with your dietitian about what dietary choices are best for you. Grains  Whole-grain or whole-wheat bread. Whole-grain or whole-wheat pasta. Brown rice. [place]. Bulgur. Whole-grain and low-sodium cereals. Pita bread. Low-fat, low-sodium crackers. Whole-wheat flour tortillas. Vegetables  Fresh or frozen vegetables (raw, steamed,  roasted, or grilled). Low-sodium or reduced-sodium tomato and vegetable juice. Low-sodium or reduced-sodium tomato sauce and tomato paste. Low-sodium or reduced-sodium canned vegetables. Fruits  All fresh, dried, or frozen fruit. Canned fruit in natural juice (without added sugar). Meat and other protein foods  Skinless chicken or [place]. Ground chicken or [place]. Pork with fat trimmed off. Fish and seafood. Egg whites. Dried beans, peas, or lentils. Unsalted nuts, nut butters, and seeds. Unsalted canned beans. Lean cuts of beef with fat trimmed off. Low-sodium, lean deli meat. Dairy  Low-fat (1%) or fat-free (skim) milk. Fat-free, low-fat, or reduced-fat cheeses. Nonfat, low-sodium ricotta or cottage cheese. Low-fat or nonfat yogurt. Low-fat, low-sodium cheese. Fats and oils  Soft margarine without trans fats. Vegetable oil. Low-fat, reduced-fat, or light mayonnaise and salad dressings (reduced-sodium). Canola, safflower, olive, soybean, and sunflower oils. Avocado. Seasoning and other foods  Herbs. Spices. Seasoning mixes without salt. Unsalted popcorn and pretzels. Fat-free sweets. What foods are not recommended? The items listed may not be a complete list. Talk with your dietitian about what dietary choices are best for you. Grains  Baked goods made with fat, such as croissants, muffins, or some breads. Dry pasta or rice meal packs. Vegetables  Creamed or fried vegetables. Vegetables in a cheese sauce. Regular canned vegetables (not low-sodium or reduced-sodium). Regular canned tomato sauce and paste (not low-sodium or reduced-sodium). Regular tomato and vegetable juice (not low-sodium or reduced-sodium). Angie Fava. Olives. Fruits  Canned fruit in a light or heavy syrup. Fried fruit. Fruit in cream or butter sauce. Meat and other protein foods  Fatty cuts of meat. Ribs. Fried meat. Berniece Salines. Sausage. Bologna and other processed lunch meats. Salami. Fatback. Hotdogs. Bratwurst. Salted nuts and  seeds. Canned beans with added salt. Canned or smoked fish. Whole eggs or egg yolks. Chicken or [place] with skin. Dairy  Whole or 2% milk, cream, and half-and-half. Whole or full-fat cream cheese. Whole-fat or sweetened yogurt. Full-fat cheese. Nondairy creamers. Whipped toppings. Processed cheese and cheese spreads. Fats and oils  Butter. Stick margarine. Lard. Shortening. Ghee. Bacon fat. Tropical oils, such as coconut, palm kernel, or palm oil. Seasoning and other foods  Salted popcorn and pretzels. Onion salt, garlic salt, seasoned salt, table salt, and sea salt. Worcestershire sauce. Tartar sauce. Barbecue sauce. Teriyaki sauce. Soy sauce, including reduced-sodium. Steak sauce. Canned and packaged gravies. Fish sauce. Oyster sauce. Cocktail sauce. Horseradish that you find on the shelf. Ketchup. Mustard. Meat flavorings and tenderizers. Bouillon cubes. Hot sauce and Tabasco sauce. Premade or packaged marinades. Premade or packaged taco seasonings. Relishes. Regular salad dressings. Where to find more information:  National Heart, Lung, and [place]: https://wilson-eaton.com/  American Heart Association: www.heart.org Summary  The DASH eating plan is a healthy eating plan that has been shown to reduce high blood pressure (hypertension). It may also reduce your risk for type 2 diabetes, heart disease, and stroke.  With the DASH eating plan, you should limit salt (sodium) intake to 2,300 mg a day.  If you have hypertension, you may need to reduce your sodium intake to 1,500 mg a day.  When on the DASH eating plan, aim to eat more fresh fruits and vegetables, whole grains, lean proteins, low-fat dairy, and heart-healthy fats.  Work with your health care provider or diet and nutrition specialist (dietitian) to adjust your eating plan to your individual calorie needs. This information is not intended to replace advice given to you by your health care provider. Make sure you discuss any questions  you have with your health care provider. Document Released: 05/10/2011 Document Revised: 05/14/2016 Document Reviewed: 05/14/2016 Elsevier Interactive Patient Education  2017 [employer].

## 2016-09-24 NOTE — Assessment & Plan Note (Addendum)
Blood pressures all well controlled in office today - home monitor comparable to in office manual cuff. Pt states she has decreased carvedilol to 12.'5mg'$  bid, with some high readings at home. She is also now off amlodipine. Recommended she continue current regimen, may take whole carvedilol '25mg'$  PRN high blood pressure readings >160/100. Pt agrees with plan. DASH diet reviewed, handout provided.

## 2016-09-24 NOTE — Progress Notes (Signed)
BP 126/60 (BP Location: Left Arm, Cuff Size: Large)   Pulse 74   Temp 97.8 F (36.6 C) (Oral)   Wt 131 lb 8 oz (59.6 kg)   BMI 25.47 kg/m    CC: check blood pressure Subjective:    Patient ID: Taylor Castaneda, female    DOB: Sep 22, 1932, 83 y.o.   MRN: [id number]  HPI: Taylor Castaneda is a 83 y.o. female presenting on 09/24/2016 for Blood Pressure Check (has been up and down)   HTN - Compliant with current antihypertensive regimen of coreg '25mg'$  bid, lasix '40mg'$  daily, ramipril '10mg'$  daily. Does check blood pressures at home: "up and down".  This morning 166/84. Today in office 100/52. Some orthostatic blood pressures noted along with occaisonal dizziness. Denies HA, vision changes, CP/tightness, leg swelling.  Chronic dyspnea.   She has stopped amlodipine.   In office: Home meter 135/77 77, 128/69 70 Office meter cuff 126/60  Relevant past medical, surgical, family and social history reviewed and updated as indicated. Interim medical history since our last visit reviewed. Allergies and medications reviewed and updated. Outpatient Medications Prior to Visit  Medication Sig Dispense Refill  . aspirin 81 MG tablet Take 81 mg by mouth daily.      Marland Kitchen atorvastatin (LIPITOR) 40 MG tablet TAKE 1/2 TABLET BY MOUTH EVERY DAY AT 6PM 45 tablet 0  . B-D ULTRAFINE III SHORT PEN 31G X 8 MM MISC USE AS DIRECTED WITH INSULIN 100 each 3  . Biotin (BIOTIN 5000) 5 MG CAPS Take 1 capsule by mouth daily.    . Cholecalciferol (VITAMIN D-3 PO) Take 2,000 Units by mouth.    . furosemide (LASIX) 40 MG tablet TAKE ONE TABLET BY MOUTH ONCE DAILY 90 tablet 1  . glucose blood (FREESTYLE LITE) test strip Use as instructed 100 each 2  . insulin glargine (LANTUS SOLOSTAR) 100 UNIT/ML injection Inject 35 Units into the skin at bedtime. YP'PJ by Dr.Balan    . insulin lispro (HUMALOG KWIKPEN) 100 UNIT/ML injection Inject 10 Units into the skin 3 (three) times daily before meals.     Marland Kitchen KLOR-CON M10 10 MEQ tablet TAKE  ONE TABLET BY MOUTH ON MONDAY, WEDNESDAY, AND FRIDAY 36 tablet 3  . Multiple Vitamins-Minerals (PRESERVISION AREDS 2) CAPS Take 1 capsule by mouth daily.    . naproxen sodium (ANAPROX) 220 MG tablet Take 220 mg by mouth as needed.    . ramipril (ALTACE) 10 MG capsule TAKE ONE CAPSULE BY MOUTH ONCE DAILY 90 capsule 0  . carvedilol (COREG) 25 MG tablet TAKE ONE TABLET BY MOUTH TWICE DAILY (Patient taking differently: TAKE ONE TABLET BY MOUTH TWICE DAILY, PATIENT STATED THAT SHE IS TAKING 1/2 TWICE A DAY) 180 tablet 2  . amLODipine (NORVASC) 2.5 MG tablet TAKE 1 TABLET (2.5 MG TOTAL) BY MOUTH DAILY. (Patient not taking: Reported on 09/24/2016) 30 tablet 11   No facility-administered medications prior to visit.      Per HPI unless specifically indicated in ROS section below Review of Systems     Objective:    BP 126/60 (BP Location: Left Arm, Cuff Size: Large)   Pulse 74   Temp 97.8 F (36.6 C) (Oral)   Wt 131 lb 8 oz (59.6 kg)   BMI 25.47 kg/m   Wt Readings from Last 3 Encounters:  09/24/16 131 lb 8 oz (59.6 kg)  08/09/16 132 lb 9.6 oz (60.1 kg)  08/01/16 131 lb 8 oz (59.6 kg)    Physical Exam  Constitutional:  She appears well-developed and well-nourished. No distress.  Using cane today  HENT:  Mouth/Throat: Oropharynx is clear and moist. No oropharyngeal exudate.  Cardiovascular: Normal rate, regular rhythm, normal heart sounds and intact distal pulses.   No murmur heard. Pulmonary/Chest: Effort normal and breath sounds normal. No respiratory distress. She has no wheezes. She has no rales.  Musculoskeletal: She exhibits no edema.  Skin: Skin is warm and dry. No rash noted.  Psychiatric: She has a normal mood and affect.  Nursing note and vitals reviewed.  Results for orders placed or performed in visit on 85/50/15  Basic metabolic panel  Result Value Ref Range   Sodium 142 135 - 145 mEq/L   Potassium 4.7 3.5 - 5.1 mEq/L   Chloride 106 96 - 112 mEq/L   CO2 24 19 - 32 mEq/L     Glucose, Bld 149 (H) 70 - 99 mg/dL   BUN 33 (H) 6 - 23 mg/dL   Creatinine, Ser 1.55 (H) 0.40 - 1.20 mg/dL   Calcium 9.3 8.4 - 10.5 mg/dL   GFR 34.03 (L) >60.00 mL/min  Magnesium  Result Value Ref Range   Magnesium 2.2 1.5 - 2.5 mg/dL  CK  Result Value Ref Range   Total CK 84 7 - 177 U/L      Assessment & Plan:   Problem List Items Addressed This Visit    Essential hypertension - Primary    Blood pressures all well controlled in office today - home monitor comparable to in office manual cuff. Pt states she has decreased carvedilol to 12.'5mg'$  bid, with some high readings at home. She is also now off amlodipine. Recommended she continue current regimen, may take whole carvedilol '25mg'$  PRN high blood pressure readings >160/100. Pt agrees with plan. DASH diet reviewed, handout provided.       Relevant Medications   carvedilol (COREG) 12.5 MG tablet       Follow up plan: Return as needed.  [name], MD

## 2016-10-18 DIAGNOSIS — E1165 Type 2 diabetes mellitus with hyperglycemia: Secondary | ICD-10-CM | POA: Diagnosis not present

## 2016-10-18 DIAGNOSIS — M1712 Unilateral primary osteoarthritis, left knee: Secondary | ICD-10-CM | POA: Diagnosis not present

## 2016-10-31 ENCOUNTER — Other Ambulatory Visit: Payer: Self-pay | Admitting: Family Medicine

## 2016-11-20 ENCOUNTER — Ambulatory Visit (INDEPENDENT_AMBULATORY_CARE_PROVIDER_SITE_OTHER)
Admission: RE | Admit: 2016-11-20 | Discharge: 2016-11-20 | Disposition: A | Discharge status: Home / Self Care | Payer: Medicare Other | Source: Ambulatory Visit | Attending: Family Medicine | Admitting: Family Medicine

## 2016-11-20 ENCOUNTER — Ambulatory Visit (INDEPENDENT_AMBULATORY_CARE_PROVIDER_SITE_OTHER): Payer: Medicare Other | Admitting: Family Medicine

## 2016-11-20 ENCOUNTER — Encounter: Payer: Self-pay | Admitting: Family Medicine

## 2016-11-20 VITALS — BP 136/78 | HR 65 | Temp 98.4°F | Ht 60.25 in | Wt 129.8 lb

## 2016-11-20 DIAGNOSIS — Z01818 Encounter for other preprocedural examination: Secondary | ICD-10-CM

## 2016-11-20 DIAGNOSIS — E1122 Type 2 diabetes mellitus with diabetic chronic kidney disease: Secondary | ICD-10-CM | POA: Diagnosis not present

## 2016-11-20 DIAGNOSIS — IMO0002 Reserved for concepts with insufficient information to code with codable children: Secondary | ICD-10-CM

## 2016-11-20 DIAGNOSIS — N183 Chronic kidney disease, stage 3 unspecified: Secondary | ICD-10-CM

## 2016-11-20 DIAGNOSIS — E1121 Type 2 diabetes mellitus with diabetic nephropathy: Secondary | ICD-10-CM | POA: Diagnosis not present

## 2016-11-20 DIAGNOSIS — M353 Polymyalgia rheumatica: Secondary | ICD-10-CM | POA: Diagnosis not present

## 2016-11-20 DIAGNOSIS — I739 Peripheral vascular disease, unspecified: Secondary | ICD-10-CM | POA: Diagnosis not present

## 2016-11-20 DIAGNOSIS — I10 Essential (primary) hypertension: Secondary | ICD-10-CM | POA: Diagnosis not present

## 2016-11-20 DIAGNOSIS — E1165 Type 2 diabetes mellitus with hyperglycemia: Secondary | ICD-10-CM | POA: Diagnosis not present

## 2016-11-20 LAB — CBC WITH DIFFERENTIAL/PLATELET
BASOS PCT: 0.9 % (ref 0.0–3.0)
Basophils Absolute: 0.1 10*3/uL (ref 0.0–0.1)
EOS PCT: 2.7 % (ref 0.0–5.0)
Eosinophils Absolute: 0.2 10*3/uL (ref 0.0–0.7)
HEMATOCRIT: 36.9 % (ref 36.0–46.0)
Hemoglobin: 12.5 g/dL (ref 12.0–15.0)
LYMPHS PCT: 27.2 % (ref 12.0–46.0)
Lymphs Abs: 2.4 10*3/uL (ref 0.7–4.0)
MCHC: 33.8 g/dL (ref 30.0–36.0)
MCV: 93.2 fl (ref 78.0–100.0)
MONOS PCT: 9.2 % (ref 3.0–12.0)
Monocytes Absolute: 0.8 10*3/uL (ref 0.1–1.0)
NEUTROS ABS: 5.4 10*3/uL (ref 1.4–7.7)
Neutrophils Relative %: 60 % (ref 43.0–77.0)
PLATELETS: 186 10*3/uL (ref 150.0–400.0)
RBC: 3.96 Mil/uL (ref 3.87–5.11)
RDW: 13.1 % (ref 11.5–15.5)
WBC: 9 10*3/uL (ref 4.0–10.5)

## 2016-11-20 LAB — PROTIME-INR
INR: 1.1 ratio — ABNORMAL HIGH (ref 0.8–1.0)
Prothrombin Time: 11.9 s (ref 9.6–13.1)

## 2016-11-20 LAB — COMPREHENSIVE METABOLIC PANEL
ALT: 14 U/L (ref 0–35)
AST: 17 U/L (ref 0–37)
Albumin: 4.2 g/dL (ref 3.5–5.2)
Alkaline Phosphatase: 101 U/L (ref 39–117)
BILIRUBIN TOTAL: 0.5 mg/dL (ref 0.2–1.2)
BUN: 30 mg/dL — ABNORMAL HIGH (ref 6–23)
CHLORIDE: 106 meq/L (ref 96–112)
CO2: 25 meq/L (ref 19–32)
Calcium: 10 mg/dL (ref 8.4–10.5)
Creatinine, Ser: 1.26 mg/dL — ABNORMAL HIGH (ref 0.40–1.20)
GFR: 43.17 mL/min — AB (ref >60.00)
GLUCOSE: 178 mg/dL — AB (ref 70–99)
POTASSIUM: 4.8 meq/L (ref 3.5–5.1)
Sodium: 141 mEq/L (ref 135–145)
Total Protein: 7 g/dL (ref 6.0–8.3)

## 2016-11-20 LAB — SEDIMENTATION RATE: Sed Rate: 32 mm/hr — ABNORMAL HIGH (ref 0–30)

## 2016-11-20 LAB — LDL CHOLESTEROL, DIRECT: Direct LDL: 92 mg/dL

## 2016-11-20 LAB — HEMOGLOBIN A1C: Hgb A1c MFr Bld: 8.1 % — ABNORMAL HIGH (ref 4.6–6.5)

## 2016-11-20 NOTE — Assessment & Plan Note (Signed)
S/p VVS eval - mild to moderate infrainguinal arterial occlusive disease bilaterally with stable claudication rec conservative management.

## 2016-11-20 NOTE — Assessment & Plan Note (Signed)
H/o this 2012 (ESR 46 at that time) s/p prednisone taper over 2 yrs, discontinued 2014. Update ESR.

## 2016-11-20 NOTE — Assessment & Plan Note (Signed)
Followed by endo. Pt endorses readings ranging from 150-200 at home. Latest recall A1c 7.8%. Update today.

## 2016-11-20 NOTE — Patient Instructions (Addendum)
Preop evaluation today with EKG and xray and labs.  Good to see you today. We will fax office note and results to Dr Alusio's office.  Good luck with upcoming surgery!  Let us know if any questions or concerns prior to surgery.

## 2016-11-20 NOTE — Assessment & Plan Note (Signed)
Healthy 83 yo. RCRI score = 1 (insulin dependent diabetic). She also has chronic kidney disease. Discussed intrinsic risks of any surgery including general anesthesia risks. She has tolerated this well in the past (last 1998). Update labs, update EKG and CXR today.  Assuming all normal, anticipate adequately low risk to proceed without further evaluation.

## 2016-11-20 NOTE — Progress Notes (Addendum)
BP 136/78   Pulse 65   Temp 98.4 F (36.9 C)   Ht 5' 0.25" (1.53 m)   Wt 129 lb 12 oz (58.9 kg)   SpO2 98%   BMI 25.13 kg/m    CC: preop evaluation  Subjective:    Patient ID: Taylor Castaneda, female    DOB: 1932/11/13, 83 y.o.   MRN: [id number]  HPI: Taylor Castaneda is a 83 y.o. female presenting on 11/20/2016 for Medical Clearance (Knee Replacement Taylor Castaneda)   Upcoming L TKA planned by Taylor Castaneda 12/17/2016. Activity limiting knee pain. Using cane over the past month. Previously participated regularly in water aerobics without dyspnea or chest discomfort, but over the past 2 months she has stopped this due to knee pain. Patient denies chest pain, tightness, shortness of breath, cough, fevers/chills, headaches, dizziness. She recently stopped gabapentin and cut carvedilol in half with improvement of dizziness.   She has had surgeries in the past - latest was colectomy for colon cancer 1998. Has tolerated GETA in the past.   DM - managed with insulin (lantus 30-35u QHS, humalog kwikpen 10u TID AC). She checks sugars 4 times daily (with each meal and at bedtime). States sugars acceptable range (usually 150-180 pre-meal, occasional 200s). She sees endocrinologist Taylor Castaneda.  Lab Results  Component Value Date   HGBA1C 8.3 (H) 01/23/2016     fmhx CAD, stroke.   PMR dx in chart - pt denies this. Not on prednisone.   She brings living will today. She states daughter Taylor Castaneda is HCPOA. Did not bring HCPOA form today, will bring this in.   Relevant past medical, surgical, family and social history reviewed and updated as indicated. Interim medical history since our last visit reviewed. Allergies and medications reviewed and updated. Outpatient Medications Prior to Visit  Medication Sig Dispense Refill  . aspirin 81 MG tablet Take 81 mg by mouth daily.      Marland Kitchen atorvastatin (LIPITOR) 40 MG tablet TAKE 1/2 TABLET BY MOUTH EVERY DAY AT 6PM 45 tablet 0  . B-D ULTRAFINE III SHORT PEN 31G X  8 MM MISC USE AS DIRECTED WITH INSULIN 100 each 3  . Biotin (BIOTIN 5000) 5 MG CAPS Take 1 capsule by mouth daily.    . Cholecalciferol (VITAMIN D-3 PO) Take 2,000 Units by mouth.    . furosemide (LASIX) 40 MG tablet TAKE ONE TABLET BY MOUTH ONCE DAILY (Patient taking differently: Take 1/2 Tablet Daily) 90 tablet 1  . glucose blood (FREESTYLE LITE) test strip Use as instructed 100 each 2  . insulin glargine (LANTUS SOLOSTAR) 100 UNIT/ML injection Inject 35 Units into the skin at bedtime. VO'ZD by Taylor Castaneda    . insulin lispro (HUMALOG KWIKPEN) 100 UNIT/ML injection Inject 10 Units into the skin 3 (three) times daily before meals.     Marland Kitchen KLOR-CON M10 10 MEQ tablet TAKE ONE TABLET BY MOUTH ON MONDAY, WEDNESDAY, AND FRIDAY 36 tablet 3  . Multiple Vitamins-Minerals (PRESERVISION AREDS 2) CAPS Take 2 capsules by mouth daily.     . naproxen sodium (ANAPROX) 220 MG tablet Take 220 mg by mouth as needed.    . ramipril (ALTACE) 10 MG capsule TAKE 1 CAPSULE BY MOUTH ONCE DAILY 90 capsule 1  . carvedilol (COREG) 12.5 MG tablet Take 1 tablet (12.5 mg total) by mouth 2 (two) times daily with a meal. (Patient taking differently: Take 12.5 mg by mouth 2 (two) times daily with a meal. Take 1/2 Tablet 2 x daily)  180 tablet 3   No facility-administered medications prior to visit.      Per HPI unless specifically indicated in ROS section below Review of Systems     Objective:    BP 136/78   Pulse 65   Temp 98.4 F (36.9 C)   Ht 5' 0.25" (1.53 m)   Wt 129 lb 12 oz (58.9 kg)   SpO2 98%   BMI 25.13 kg/m   Wt Readings from Last 3 Encounters:  11/20/16 129 lb 12 oz (58.9 kg)  09/24/16 131 lb 8 oz (59.6 kg)  08/09/16 132 lb 9.6 oz (60.1 kg)    Physical Exam  Constitutional: She appears well-developed and well-nourished. No distress.  HENT:  Mouth/Throat: Oropharynx is clear and moist. No oropharyngeal exudate.  Eyes: Conjunctivae are normal. Pupils are equal, round, and reactive to light.  Neck: Normal  range of motion. Neck supple.  Cardiovascular: Normal rate, regular rhythm, normal heart sounds and intact distal pulses.   No murmur heard. Pulmonary/Chest: Effort normal and breath sounds normal. No respiratory distress. She has no wheezes. She has no rales.  Musculoskeletal: She exhibits no edema.  Skin: Skin is warm and dry. No rash noted.  Psychiatric: She has a normal mood and affect.  Nursing note and vitals reviewed.  Results for orders placed or performed in visit on 83/25/49  Basic metabolic panel  Result Value Ref Range   Sodium 142 135 - 145 mEq/L   Potassium 4.7 3.5 - 5.1 mEq/L   Chloride 106 96 - 112 mEq/L   CO2 24 19 - 32 mEq/L   Glucose, Bld 149 (H) 70 - 99 mg/dL   BUN 33 (H) 6 - 23 mg/dL   Creatinine, Ser 1.55 (H) 0.40 - 1.20 mg/dL   Calcium 9.3 8.4 - 10.5 mg/dL   GFR 34.03 (L) >60.00 mL/min  Magnesium  Result Value Ref Range   Magnesium 2.2 1.5 - 2.5 mg/dL  CK  Result Value Ref Range   Total CK 84 7 - 177 U/L   EKG - NSR rate 60s, normal axis, intervals, no acute ST/T changes. Low voltage lateral leads.    Assessment & Plan:   Problem List Items Addressed This Visit    CKD stage 3 due to type 2 diabetes mellitus ([place])    Update labs (Cr, CBC). Will need close monitoring of fluid balance perioperatively. Already avoids NSAIDs      Relevant Orders   Comprehensive metabolic panel   CBC with Differential/Platelet   Essential hypertension   Relevant Medications   carvedilol (COREG) 12.5 MG tablet   PAD (peripheral artery disease) ([facility])    S/p VVS eval - mild to moderate infrainguinal arterial occlusive disease bilaterally with stable claudication rec conservative management.      Relevant Medications   carvedilol (COREG) 12.5 MG tablet   PMR (polymyalgia rheumatica) ([place])    H/o this 2012 (ESR 46 at that time) s/p prednisone taper over 2 yrs, discontinued 2014. Update ESR.       Relevant Orders   Sedimentation rate   Pre-op evaluation - Primary     Healthy 83 yo. RCRI score = 1 (insulin dependent diabetic). She also has chronic kidney disease. Discussed intrinsic risks of any surgery including general anesthesia risks. She has tolerated this well in the past (last 1998). Update labs, update EKG and CXR today.  Assuming all normal, anticipate adequately low risk to proceed without further evaluation.       Relevant Orders  Protime-INR   DG Chest 2 View (Completed)   EKG 12-Lead (Completed)   Uncontrolled type 2 diabetes mellitus with nephropathy ([place])    Followed by endo. Pt endorses readings ranging from 150-200 at home. Latest recall A1c 7.8%. Update today.       Relevant Orders   Hemoglobin A1c   LDL Cholesterol, Direct       Follow up plan: Return as needed.  [name], MD

## 2016-11-20 NOTE — Assessment & Plan Note (Addendum)
Update labs (Cr, CBC). Will need close monitoring of fluid balance perioperatively. Already avoids NSAIDs

## 2016-11-20 NOTE — Progress Notes (Signed)
Please place orders in EPIC as patient is being scheduled for a pre-op appointment! Thank you! 

## 2016-11-27 ENCOUNTER — Ambulatory Visit: Payer: Self-pay | Admitting: Orthopedic Surgery

## 2016-11-29 ENCOUNTER — Ambulatory Visit: Payer: Self-pay | Admitting: Orthopedic Surgery

## 2016-11-29 NOTE — Progress Notes (Signed)
Patient will need a repeat Hgb A1c on July 10th with her preop labs.  She had one recently but needs to be rechecked prior to surgery to make sure that her level is improving. Arlee Muslim, PA-C

## 2016-11-30 ENCOUNTER — Ambulatory Visit: Payer: Self-pay | Admitting: Orthopedic Surgery

## 2016-11-30 NOTE — H&P (Signed)
Lang Snow DOB: May 02, 1933 Widowed / Language: Cleophus Molt / Race: White Female Date of Admission:  12/17/2016 CC:  Left Knee Pain History of Present Illness The patient is a 83 year old female who comes in for a preoperative History and Physical. The patient is scheduled for a left total knee arthroplasty to be performed by Dr. Dione Plover. Aluisio, MD at [facility] on 12-17-2016. The patient is a 83 year old female who presented for follow up of their knee. The patient is being followed for their left knee pain and osteoarthritis. They are now over a year out from [place] series. Symptoms reported include: pain, swelling, aching, stiffness, grinding, giving way, instability and difficulty ambulating. The patient feels that they are doing poorly and report their pain level to be moderate to severe. The following medication has been used for pain control: Tylenol. The patient has not gotten any relief of their symptoms with viscosupplementation (she states the injections may have helped for about three weeks. She did not have cortisone prior to visco, since she is a diabetic.). Note for "Follow-up Knee": She states that she was doing water aerobics until she had a fall in February. She was evaluated by her pcp for a foot injury as well as knee sprain after the fall. She states that she tried to get back into the water exercises, but felt like she had more pain in her knee afterward. Unfortunately, her left knee is getting progressively worse with time. The injections have provided minimal benefit. Even though it has been a year since she had Euflexxa, she said it did not last long. She has gotten worse since she twisted the knee in February. She has not had swelling, but the knee is preventing her from doing things she desires. She is ready to get a more permanent fix for the knee. She is ready to proceed with surgery. They have been treated conservatively in the past for the above stated problem  and despite conservative measures, they continue to have progressive pain and severe functional limitations and dysfunction. They have failed non-operative management including home exercise, medications, and injections. It is felt that they would benefit from undergoing total joint replacement. Risks and benefits of the procedure have been discussed with the patient and they elect to proceed with surgery. There are no active contraindications to surgery such as ongoing infection or rapidly progressive neurological disease.    Problem List/Past Medical Bilateral lower extremity pain (M79.604, M79.605)  Acute low back pain, unspecified back pain laterality, with sciatica presence unspecified (M54.5)  Degenerative lumbar disc (M51.36)  Primary osteoarthritis of left knee (M17.12)  Lumbar spinal stenosis (M48.07)  Colon Cancer  Diabetes Mellitus, Type I  Hypercholesterolemia  Kidney Stone  Osteoporosis  Shingles  Glaucoma  Cataract  Diptheria  Measles  Mumps    Allergies Pravastatin Sodium *ANTIHYPERLIPIDEMICS*  Did not work  Family History Cancer  mother and sister Cerebrovascular Accident  father Diabetes Mellitus  father and sister Heart Disease  father and grandfather fathers side Heart disease in female family member before age 77  Father  Deceased, Myocardial infarction. age 65 Mother  Deceased, Cancer. age 52  Social History Alcohol use  current drinker; drinks wine; only occasionally per week Children  4 Current work status  retired Engineer, agricultural (Currently)  no Drug/Alcohol Rehab (Previously)  no Exercise  Inactive, running/walking, 45 - 60 minutes. Exercises rarely; does other Illicit drug use  no Living situation  live alone Marital status  widowed Number of flights of stairs before winded  1 Pain Contract  no Tobacco / smoke exposure  no Tobacco use  former smoker; smoke(d) less than 1/2 pack(s) per day Advance  Directives  Living Will, Healthcare POA Natural Steps with family  Medication History  Tylenol Arthritis Pain (650MG  Tablet ER, Oral) Active. Atorvastatin Calcium (20MG  Tablet, Oral) Active. Carvedilol (25MG  Tablet, Oral) Active. Furosemide (40MG  Tablet, Oral) Active. Klor-Con M10 (10MEQ Tablet ER, Oral) Active. Ramipril (10MG  Capsule, Oral) Active. HumaLOG KwikPen (100UNIT/ML Soln Pen-inj, Subcutaneous) Active. Lantus SoloStar (100UNIT/ML Soln Pen-inj, Subcutaneous) Active. Biotin (5000MCG Tablet, Oral) Active. PreserVision AREDS (Oral) Active. D3-1000 (1000UNIT Capsule, Oral) Active. Aspirin (81MG  Tablet, Oral) Active.   Past Surgical History Appendectomy  Carpal Tunnel Repair  bilateral Cataract Surgery  bilateral Colectomy  partial Colon Polyp Removal - Colonoscopy  Tonsillectomy  Tubal Ligation    Review of Systems General Not Present- Chills, Fatigue, Fever, Memory Loss, Night Sweats, Weight Gain and Weight Loss. Skin Not Present- Eczema, Hives, Itching, Lesions and Rash. HEENT Present- Glaucoma. Not Present- Dentures, Double Vision, Headache, Hearing Loss, Tinnitus and Visual Loss. Respiratory Present- Shortness of breath with exertion. Not Present- Allergies, Chronic Cough, Coughing up blood and Shortness of breath at rest. Cardiovascular Present- Difficulty Breathing On Exertion and Leg Cramps. Not Present- Chest Pain, Difficulty Breathing Lying Down, Murmur, Palpitations, Racing/skipping heartbeats and Swelling. Gastrointestinal Not Present- Abdominal Pain, Bloody Stool, Constipation, Diarrhea, Difficulty Swallowing, Heartburn, Jaundice, Loss of appetitie, Nausea and Vomiting. Female Genitourinary Not Present- Blood in Urine, Discharge, Flank Pain, Incontinence, Painful Urination, Urgency, Urinary frequency, Urinary Retention, Urinating at Night and Weak urinary stream. Musculoskeletal Present- Joint Stiffness and Leg Cramps. Not Present-  Back Pain, Joint Pain, Joint Swelling, Morning Stiffness, Muscle Pain, Muscle Weakness and Spasms. Neurological Present- Numbness. Not Present- Blackout spells, Difficulty with balance, Dizziness, Paralysis, Tremor and Weakness. Psychiatric Not Present- Insomnia.  Vitals  Weight: 129 lb Height: 60in Weight was reported by patient. Height was reported by patient. Body Surface Area: 1.55 m Body Mass Index: 25.19 kg/m  Pulse: 68 (Regular)  BP: 122/70 (Sitting, Right Arm, Standard)       Physical Exam General Mental Status -Alert, cooperative and good historian. General Appearance-pleasant, Not in acute distress. Orientation-Oriented X3. Build & Nutrition-Well nourished and Well developed.  Head and Neck Head-normocephalic, atraumatic . Neck Global Assessment - supple, no bruit auscultated on the right, no bruit auscultated on the left.  Eye Pupil - Bilateral-Regular and Round. Motion - Bilateral-EOMI.  ENMT Note: upper and lower dentures   Chest and Lung Exam Auscultation Breath sounds - clear at anterior chest wall and clear at posterior chest wall. Adventitious sounds - No Adventitious sounds.  Cardiovascular Auscultation Rhythm - Regular rate and rhythm. Heart Sounds - S1 WNL and S2 WNL. Murmurs & Other Heart Sounds - Auscultation of the heart reveals - No Murmurs.  Abdomen Palpation/Percussion Tenderness - Abdomen is non-tender to palpation. Rigidity (guarding) - Abdomen is soft. Auscultation Auscultation of the abdomen reveals - Bowel sounds normal.  Female Genitourinary Note: Not done, not pertinent to present illness   Musculoskeletal Note: She is alert and oriented, in no apparent distress. Evaluation of her left knee shows no effusion. Her range is about 5 to 125. There is marked crepitus on range of motion. She is tender on medial and lateral with no instability noted. Her hip exam on the left is normal. Her right knee, no  effusion, range about 0 to 130 with no tenderness or  instability.     Assessment & Plan  Primary osteoarthritis of left knee (M17.12)  Surgical Plans: Left Total Knee Replacement  Disposition: Home with Family, Plan for outpatient therapy at [facility] starting Friday 12/2016 following surgery. Prescription for Physical Therapy given.  PCP: Dr. Encarnacion Slates  IV TXA  Anesthesia Issues: None  Patient was instructed on what medications to stop prior to surgery.  Please note that the patient will have their HGB A1C level repeated on 12/11/2016 with her preop labs. Most recent HGB A1C level was noted to be high at 8.1% taken on 11/20/2016. This was discussed with the patient at her preop H&P and she was referred back to her PCP for recommendations. Her PCP did not suggest any further changes to her medications at that time and informed the patient that she could proceed with the planned orthopaedic procedure. Orthopedic studies suugest that preoperative HGB A1C levels >8% are associated with increased hospital LOS following surgery and increased complications. This was relayed to the patient and she stated that she understood the increased risks involved aasociated with the higher preop levels. She will be working on tighter glucose control prior to surgery and will have the level rechecked. If the level is improving, she will proceed with surgery. If the level increases, then the surgery will be cancelled.  Signed electronically by Joelene Millin, III PA-C

## 2016-12-03 ENCOUNTER — Ambulatory Visit: Payer: Self-pay | Admitting: Orthopedic Surgery

## 2016-12-06 ENCOUNTER — Other Ambulatory Visit: Payer: Self-pay | Admitting: Family Medicine

## 2016-12-10 NOTE — Progress Notes (Signed)
11-20-16 (EPIC) EKG, CXR, CMP (abnormal), HGA1C (elevated), CBC w/Diff (normal), PT/INR

## 2016-12-10 NOTE — Patient Instructions (Addendum)
MACKENIZE DELGADILLO  12/10/2016   Your procedure is scheduled on: 12-17-16   Report to [facility] Main  Entrance Report to Admitting at 9:05 AM   Call this number if you have problems the morning of surgery  [phone]   Remember: ONLY 1 PERSON MAY GO WITH YOU TO SHORT STAY TO GET  READY MORNING OF YOUR SURGERY.  Do not eat food or drink liquids :After Midnight.     Take these medicines the morning of surgery with A SIP OF WATER: Carvedilol (Coreg). You may also bring and use your eyedrops. DO NOT TAKE ANY DIABETIC MEDICATIONS DAY OF YOUR SURGERY                               You may not have any metal on your body including hair pins and              piercings  Do not wear jewelry, make-up, lotions, powders or perfumes, deodorant             Do not wear nail polish.  Do not shave  48 hours prior to surgery.              Do not bring valuables to the hospital. [place].  Contacts, dentures or bridgework may not be worn into surgery.  Leave suitcase in the car. After surgery it may be brought to your room.     Please read over the following fact sheets you were given: _____________________________________________________________________  How to Manage Your Diabetes Before and After Surgery  Why is it important to control my blood sugar before and after surgery? . Improving blood sugar levels before and after surgery helps healing and can limit problems. . A way of improving blood sugar control is eating a healthy diet by: o  Eating less sugar and carbohydrates o  Increasing activity/exercise o  Talking with your doctor about reaching your blood sugar goals . High blood sugars (greater than 180 mg/dL) can raise your risk of infections and slow your recovery, so you will need to focus on controlling your diabetes during the weeks before surgery. . Make sure that the doctor who takes care of your diabetes knows  about your planned surgery including the date and location.  How do I manage my blood sugar before surgery? . Check your blood sugar at least 4 times a day, starting 2 days before surgery, to make sure that the level is not too high or low. o Check your blood sugar the morning of your surgery when you wake up and every 2 hours until you get to the Short Stay unit. . If your blood sugar is less than 70 mg/dL, you will need to treat for low blood sugar: o Do not take insulin. o Treat a low blood sugar (less than 70 mg/dL) with  cup of clear juice (cranberry or apple), 4 glucose tablets, OR glucose gel. o Recheck blood sugar in 15 minutes after treatment (to make sure it is greater than 70 mg/dL). If your blood sugar is not greater than 70 mg/dL on recheck, call [phone] for further instructions. . Report your blood sugar to the short stay nurse when you get to Short Stay.  Marland Kitchen  If you are admitted to the hospital after surgery: o Your blood sugar will be checked by the staff and you will probably be given insulin after surgery (instead of oral diabetes medicines) to make sure you have good blood sugar levels. o The goal for blood sugar control after surgery is 80-180 mg/dL.   WHAT DO I DO ABOUT MY DIABETES MEDICATION?  Marland Kitchen Do not take oral diabetes medicines (pills) the morning of surgery. . Take your usual dose of Humalog the day before the procedure  . THE NIGHT BEFORE SURGERY, take  17  units of Lantus      insulin.     . . The day of surgery, do not take other diabetes injectables, including Byetta (exenatide), Bydureon (exenatide ER), Victoza (liraglutide), or Trulicity (dulaglutide).    Patient Signature:  Date:   Nurse Signature:  Date:   Reviewed and Endorsed by [facility] Patient Education Committee, August 2015           [facility] - Preparing for Surgery Before surgery, you can play an important role.  Because skin is not sterile, your skin needs to be as free of germs  as possible.  You can reduce the number of germs on your skin by washing with CHG (chlorahexidine gluconate) soap before surgery.  CHG is an antiseptic cleaner which kills germs and bonds with the skin to continue killing germs even after washing. Please DO NOT use if you have an allergy to CHG or antibacterial soaps.  If your skin becomes reddened/irritated stop using the CHG and inform your nurse when you arrive at Short Stay. Do not shave (including legs and underarms) for at least 48 hours prior to the first CHG shower.  You may shave your face/neck. Please follow these instructions carefully:  1.  Shower with CHG Soap the night before surgery and the  morning of Surgery.  2.  If you choose to wash your hair, wash your hair first as usual with your  normal  shampoo.  3.  After you shampoo, rinse your hair and body thoroughly to remove the  shampoo.                           4.  Use CHG as you would any other liquid soap.  You can apply chg directly  to the skin and wash                       Gently with a scrungie or clean washcloth.  5.  Apply the CHG Soap to your body ONLY FROM THE NECK DOWN.   Do not use on face/ open                           Wound or open sores. Avoid contact with eyes, ears mouth and genitals (private parts).                       Wash face,  Genitals (private parts) with your normal soap.             6.  Wash thoroughly, paying special attention to the area where your surgery  will be performed.  7.  Thoroughly rinse your body with warm water from the neck down.  8.  DO NOT shower/wash with your normal soap after using and rinsing off  the CHG Soap.  9.  Pat yourself dry with a clean towel.            10.  Wear clean pajamas.            11.  Place clean sheets on your bed the night of your first shower and do not  sleep with pets. Day of Surgery : Do not apply any lotions/deodorants the morning of surgery.  Please wear clean clothes to the hospital/surgery  center.  FAILURE TO FOLLOW THESE INSTRUCTIONS MAY RESULT IN THE CANCELLATION OF YOUR SURGERY PATIENT SIGNATURE_________________________________  NURSE SIGNATURE__________________________________  ________________________________________________________________________   Adam Phenix  An incentive spirometer is a tool that can help keep your lungs clear and active. This tool measures how well you are filling your lungs with each breath. Taking long deep breaths may help reverse or decrease the chance of developing breathing (pulmonary) problems (especially infection) following:  A long period of time when you are unable to move or be active. BEFORE THE PROCEDURE   If the spirometer includes an indicator to show your best effort, your nurse or respiratory therapist will set it to a desired goal.  If possible, sit up straight or lean slightly forward. Try not to slouch.  Hold the incentive spirometer in an upright position. INSTRUCTIONS FOR USE  1. Sit on the edge of your bed if possible, or sit up as far as you can in bed or on a chair. 2. Hold the incentive spirometer in an upright position. 3. Breathe out normally. 4. Place the mouthpiece in your mouth and seal your lips tightly around it. 5. Breathe in slowly and as deeply as possible, raising the piston or the ball toward the top of the column. 6. Hold your breath for 3-5 seconds or for as long as possible. Allow the piston or ball to fall to the bottom of the column. 7. Remove the mouthpiece from your mouth and breathe out normally. 8. Rest for a few seconds and repeat Steps 1 through 7 at least 10 times every 1-2 hours when you are awake. Take your time and take a few normal breaths between deep breaths. 9. The spirometer may include an indicator to show your best effort. Use the indicator as a goal to work toward during each repetition. 10. After each set of 10 deep breaths, practice coughing to be sure your lungs are  clear. If you have an incision (the cut made at the time of surgery), support your incision when coughing by placing a pillow or rolled up towels firmly against it. Once you are able to get out of bed, walk around indoors and cough well. You may stop using the incentive spirometer when instructed by your caregiver.  RISKS AND COMPLICATIONS  Take your time so you do not get dizzy or light-headed.  If you are in pain, you may need to take or ask for pain medication before doing incentive spirometry. It is harder to take a deep breath if you are having pain. AFTER USE  Rest and breathe slowly and easily.  It can be helpful to keep track of a log of your progress. Your caregiver can provide you with a simple table to help with this. If you are using the spirometer at home, follow these instructions: [place] IF:   You are having difficultly using the spirometer.  You have trouble using the spirometer as often as instructed.  Your pain medication is not giving enough relief while using the spirometer.  You  develop fever of 100.5 F (38.1 C) or higher. SEEK IMMEDIATE MEDICAL CARE IF:   You cough up bloody sputum that had not been present before.  You develop fever of 102 F (38.9 C) or greater.  You develop worsening pain at or near the incision site. MAKE SURE YOU:   Understand these instructions.  Will watch your condition.  Will get help right away if you are not doing well or get worse. Document Released: 10/01/2006 Document Revised: 08/13/2011 Document Reviewed: 12/02/2006 ExitCare Patient Information 2014 Exit[employer], [place].   ________________________________________________________________________  WHAT IS A BLOOD TRANSFUSION? Blood Transfusion Information  A transfusion is the replacement of blood or some of its parts. Blood is made up of multiple cells which provide different functions.  Red blood cells carry oxygen and are used for blood loss  replacement.  White blood cells fight against infection.  Platelets control bleeding.  Plasma helps clot blood.  Other blood products are available for specialized needs, such as hemophilia or other clotting disorders. BEFORE THE TRANSFUSION  Who gives blood for transfusions?   Healthy volunteers who are fully evaluated to make sure their blood is safe. This is blood bank blood. Transfusion therapy is the safest it has ever been in the practice of medicine. Before blood is taken from a donor, a complete history is taken to make sure that person has no history of diseases nor engages in risky social behavior (examples are intravenous drug use or sexual activity with multiple partners). The donor's travel history is screened to minimize risk of transmitting infections, such as malaria. The donated blood is tested for signs of infectious diseases, such as HIV and hepatitis. The blood is then tested to be sure it is compatible with you in order to minimize the chance of a transfusion reaction. If you or a relative donates blood, this is often done in anticipation of surgery and is not appropriate for emergency situations. It takes many days to process the donated blood. RISKS AND COMPLICATIONS Although transfusion therapy is very safe and saves many lives, the main dangers of transfusion include:   Getting an infectious disease.  Developing a transfusion reaction. This is an allergic reaction to something in the blood you were given. Every precaution is taken to prevent this. The decision to have a blood transfusion has been considered carefully by your caregiver before blood is given. Blood is not given unless the benefits outweigh the risks. AFTER THE TRANSFUSION  Right after receiving a blood transfusion, you will usually feel much better and more energetic. This is especially true if your red blood cells have gotten low (anemic). The transfusion raises the level of the red blood cells which  carry oxygen, and this usually causes an energy increase.  The nurse administering the transfusion will monitor you carefully for complications. HOME CARE INSTRUCTIONS  No special instructions are needed after a transfusion. You may find your energy is better. Speak with your caregiver about any limitations on activity for underlying diseases you may have. SEEK MEDICAL CARE IF:   Your condition is not improving after your transfusion.  You develop redness or irritation at the intravenous (IV) site. SEEK IMMEDIATE MEDICAL CARE IF:  Any of the following symptoms occur over the next 12 hours:  Shaking chills.  You have a temperature by mouth above 102 F (38.9 C), not controlled by medicine.  Chest, back, or muscle pain.  People around you feel you are not acting correctly or are confused.  Shortness of  breath or difficulty breathing.  Dizziness and fainting.  You get a rash or develop hives.  You have a decrease in urine output.  Your urine turns a dark color or changes to pink, red, or brown. Any of the following symptoms occur over the next 10 days:  You have a temperature by mouth above 102 F (38.9 C), not controlled by medicine.  Shortness of breath.  Weakness after normal activity.  The white part of the eye turns yellow (jaundice).  You have a decrease in the amount of urine or are urinating less often.  Your urine turns a dark color or changes to pink, red, or brown. Document Released: 05/18/2000 Document Revised: 08/13/2011 Document Reviewed: 01/05/2008 [facility] Patient Information 2014 [place], [place].  _______________________________________________________________________

## 2016-12-11 ENCOUNTER — Encounter (HOSPITAL_COMMUNITY): Payer: Self-pay

## 2016-12-11 ENCOUNTER — Encounter (HOSPITAL_COMMUNITY)
Admission: RE | Admit: 2016-12-11 | Discharge: 2016-12-11 | Disposition: A | Discharge status: Home / Self Care | Payer: Medicare Other | Source: Ambulatory Visit | Attending: Orthopedic Surgery | Admitting: Orthopedic Surgery

## 2016-12-11 DIAGNOSIS — Z01818 Encounter for other preprocedural examination: Secondary | ICD-10-CM | POA: Insufficient documentation

## 2016-12-11 DIAGNOSIS — M1712 Unilateral primary osteoarthritis, left knee: Secondary | ICD-10-CM | POA: Diagnosis not present

## 2016-12-11 LAB — PROTIME-INR
INR: 1.07
Prothrombin Time: 13.9 seconds (ref 11.4–15.2)

## 2016-12-11 LAB — COMPREHENSIVE METABOLIC PANEL
ALBUMIN: 4.3 g/dL (ref 3.5–5.0)
ALK PHOS: 97 U/L (ref 38–126)
ALT: 23 U/L (ref 14–54)
ANION GAP: 9 (ref 5–15)
AST: 21 U/L (ref 15–41)
BILIRUBIN TOTAL: 0.8 mg/dL (ref 0.3–1.2)
BUN: 38 mg/dL — ABNORMAL HIGH (ref 6–20)
CALCIUM: 9.8 mg/dL (ref 8.9–10.3)
CO2: 24 mmol/L (ref 22–32)
Chloride: 108 mmol/L (ref 101–111)
Creatinine, Ser: 1.09 mg/dL — ABNORMAL HIGH (ref 0.44–1.00)
GFR calc Af Amer: 53 mL/min — ABNORMAL LOW (ref >60)
GFR calc non Af Amer: 46 mL/min — ABNORMAL LOW (ref >60)
GLUCOSE: 126 mg/dL — AB (ref 65–99)
Potassium: 4.7 mmol/L (ref 3.5–5.1)
Sodium: 141 mmol/L (ref 135–145)
TOTAL PROTEIN: 7.3 g/dL (ref 6.5–8.1)

## 2016-12-11 LAB — ABO/RH: ABO/RH(D): O POS

## 2016-12-11 LAB — SURGICAL PCR SCREEN
MRSA, PCR: NEGATIVE
STAPHYLOCOCCUS AUREUS: NEGATIVE

## 2016-12-11 LAB — APTT: aPTT: 39 seconds — ABNORMAL HIGH (ref 24–36)

## 2016-12-11 LAB — GLUCOSE, CAPILLARY: Glucose-Capillary: 124 mg/dL — ABNORMAL HIGH (ref 65–99)

## 2016-12-11 NOTE — Progress Notes (Signed)
12-11-16 CMP result,  routed to Dr. Wynelle Link for review

## 2016-12-12 LAB — HEMOGLOBIN A1C
HEMOGLOBIN A1C: 7.1 % — AB (ref 4.8–5.6)
MEAN PLASMA GLUCOSE: 157 mg/dL

## 2016-12-17 ENCOUNTER — Inpatient Hospital Stay (HOSPITAL_COMMUNITY): Payer: Medicare Other | Admitting: Anesthesiology

## 2016-12-17 ENCOUNTER — Encounter (HOSPITAL_COMMUNITY)
Admission: RE | Disposition: A | Discharge status: Home / Self Care | Payer: Self-pay | Source: Ambulatory Visit | Attending: Orthopedic Surgery

## 2016-12-17 ENCOUNTER — Inpatient Hospital Stay (HOSPITAL_COMMUNITY)
Admission: RE | Admit: 2016-12-17 | Discharge: 2016-12-19 | DRG: 470 | Disposition: A | Discharge status: Home / Self Care | Payer: Medicare Other | Source: Ambulatory Visit | Attending: Orthopedic Surgery | Admitting: Orthopedic Surgery

## 2016-12-17 ENCOUNTER — Encounter (HOSPITAL_COMMUNITY): Payer: Self-pay | Admitting: *Deleted

## 2016-12-17 DIAGNOSIS — M1712 Unilateral primary osteoarthritis, left knee: Principal | ICD-10-CM | POA: Diagnosis present

## 2016-12-17 DIAGNOSIS — Z85038 Personal history of other malignant neoplasm of large intestine: Secondary | ICD-10-CM | POA: Diagnosis not present

## 2016-12-17 DIAGNOSIS — E785 Hyperlipidemia, unspecified: Secondary | ICD-10-CM | POA: Diagnosis present

## 2016-12-17 DIAGNOSIS — E119 Type 2 diabetes mellitus without complications: Secondary | ICD-10-CM | POA: Diagnosis present

## 2016-12-17 DIAGNOSIS — M179 Osteoarthritis of knee, unspecified: Secondary | ICD-10-CM | POA: Diagnosis present

## 2016-12-17 DIAGNOSIS — E1121 Type 2 diabetes mellitus with diabetic nephropathy: Secondary | ICD-10-CM

## 2016-12-17 DIAGNOSIS — M1710 Unilateral primary osteoarthritis, unspecified knee: Secondary | ICD-10-CM | POA: Diagnosis present

## 2016-12-17 DIAGNOSIS — IMO0002 Reserved for concepts with insufficient information to code with codable children: Secondary | ICD-10-CM

## 2016-12-17 DIAGNOSIS — E1122 Type 2 diabetes mellitus with diabetic chronic kidney disease: Secondary | ICD-10-CM | POA: Diagnosis not present

## 2016-12-17 DIAGNOSIS — H409 Unspecified glaucoma: Secondary | ICD-10-CM | POA: Diagnosis present

## 2016-12-17 DIAGNOSIS — M8580 Other specified disorders of bone density and structure, unspecified site: Secondary | ICD-10-CM | POA: Diagnosis not present

## 2016-12-17 DIAGNOSIS — G8918 Other acute postprocedural pain: Secondary | ICD-10-CM | POA: Diagnosis not present

## 2016-12-17 DIAGNOSIS — M25562 Pain in left knee: Secondary | ICD-10-CM | POA: Diagnosis present

## 2016-12-17 DIAGNOSIS — E1165 Type 2 diabetes mellitus with hyperglycemia: Secondary | ICD-10-CM

## 2016-12-17 DIAGNOSIS — Z87891 Personal history of nicotine dependence: Secondary | ICD-10-CM

## 2016-12-17 DIAGNOSIS — N183 Chronic kidney disease, stage 3 (moderate): Secondary | ICD-10-CM | POA: Diagnosis not present

## 2016-12-17 HISTORY — PX: TOTAL KNEE ARTHROPLASTY: SHX125

## 2016-12-17 LAB — GLUCOSE, CAPILLARY
GLUCOSE-CAPILLARY: 148 mg/dL — AB (ref 65–99)
GLUCOSE-CAPILLARY: 191 mg/dL — AB (ref 65–99)
Glucose-Capillary: 153 mg/dL — ABNORMAL HIGH (ref 65–99)
Glucose-Capillary: 252 mg/dL — ABNORMAL HIGH (ref 65–99)

## 2016-12-17 LAB — TYPE AND SCREEN
ABO/RH(D): O POS
Antibody Screen: NEGATIVE

## 2016-12-17 SURGERY — ARTHROPLASTY, KNEE, TOTAL
Anesthesia: Spinal | Site: Knee | Laterality: Left

## 2016-12-17 MED ORDER — POLYETHYLENE GLYCOL 3350 17 G PO PACK: 17.0000 g | PACK | Freq: Every day | ORAL | Status: DC | PRN

## 2016-12-17 MED ORDER — SODIUM CHLORIDE 0.9 % IV SOLN
1000.0000 mg | Freq: Once | INTRAVENOUS | Status: AC
  Administered 2016-12-17: 1000 mg via INTRAVENOUS
  Filled 2016-12-17: qty 1100

## 2016-12-17 MED ORDER — ACETAMINOPHEN 650 MG RE SUPP: 650.0000 mg | Freq: Four times a day (QID) | RECTAL | Status: DC | PRN

## 2016-12-17 MED ORDER — DEXAMETHASONE SODIUM PHOSPHATE 10 MG/ML IJ SOLN
10.0000 mg | Freq: Once | INTRAMUSCULAR | Status: AC
  Administered 2016-12-18: 10 mg via INTRAVENOUS
  Filled 2016-12-17: qty 1

## 2016-12-17 MED ORDER — SODIUM CHLORIDE 0.9 % IJ SOLN
INTRAMUSCULAR | Status: DC | PRN
  Administered 2016-12-17: 60 mL

## 2016-12-17 MED ORDER — PROMETHAZINE HCL 25 MG/ML IJ SOLN: 6.2500 mg | INTRAMUSCULAR | Status: DC | PRN

## 2016-12-17 MED ORDER — INSULIN GLARGINE 100 UNIT/ML ~~LOC~~ SOLN
35.0000 [IU] | Freq: Every day | SUBCUTANEOUS | Status: DC
  Administered 2016-12-18: 30 [IU] via SUBCUTANEOUS
  Filled 2016-12-17 (×2): qty 0.35

## 2016-12-17 MED ORDER — SODIUM CHLORIDE 0.9 % IJ SOLN
INTRAMUSCULAR | Status: AC
  Filled 2016-12-17: qty 50

## 2016-12-17 MED ORDER — FENTANYL CITRATE (PF) 100 MCG/2ML IJ SOLN
INTRAMUSCULAR | Status: AC
Start: 2016-12-17 — End: 2016-12-17
  Filled 2016-12-17: qty 2

## 2016-12-17 MED ORDER — SODIUM CHLORIDE 0.9 % IR SOLN
Status: DC | PRN
  Administered 2016-12-17: 1000 mL

## 2016-12-17 MED ORDER — DEXAMETHASONE SODIUM PHOSPHATE 10 MG/ML IJ SOLN
INTRAMUSCULAR | Status: AC
  Filled 2016-12-17: qty 1

## 2016-12-17 MED ORDER — BUPIVACAINE LIPOSOME 1.3 % IJ SUSP: 20.0000 mL | Freq: Once | INTRAMUSCULAR | Status: DC

## 2016-12-17 MED ORDER — BUPIVACAINE LIPOSOME 1.3 % IJ SUSP
20.0000 mL | Freq: Once | INTRAMUSCULAR | Status: DC
  Filled 2016-12-17: qty 20

## 2016-12-17 MED ORDER — LACTATED RINGERS IV SOLN: INTRAVENOUS | Status: DC

## 2016-12-17 MED ORDER — CLONIDINE HCL 0.1 MG PO TABS
0.1000 mg | ORAL_TABLET | Freq: Two times a day (BID) | ORAL | Status: DC | PRN
  Administered 2016-12-17: 0.1 mg via ORAL
  Filled 2016-12-17: qty 1

## 2016-12-17 MED ORDER — METHOCARBAMOL 1000 MG/10ML IJ SOLN
500.0000 mg | Freq: Four times a day (QID) | INTRAVENOUS | Status: DC | PRN
  Administered 2016-12-17: 500 mg via INTRAVENOUS
  Filled 2016-12-17: qty 550

## 2016-12-17 MED ORDER — TRAMADOL HCL 50 MG PO TABS: 50.0000 mg | ORAL_TABLET | Freq: Four times a day (QID) | ORAL | Status: DC | PRN

## 2016-12-17 MED ORDER — INSULIN ASPART 100 UNIT/ML ~~LOC~~ SOLN
10.0000 [IU] | Freq: Three times a day (TID) | SUBCUTANEOUS | Status: DC
  Administered 2016-12-18 – 2016-12-19 (×4): 10 [IU] via SUBCUTANEOUS

## 2016-12-17 MED ORDER — LACTATED RINGERS IV SOLN
INTRAVENOUS | Status: DC
  Administered 2016-12-17 (×2): via INTRAVENOUS

## 2016-12-17 MED ORDER — INSULIN LISPRO 100 UNIT/ML (KWIKPEN): 10.0000 [IU] | PEN_INJECTOR | Freq: Three times a day (TID) | SUBCUTANEOUS | Status: DC

## 2016-12-17 MED ORDER — METHOCARBAMOL 500 MG PO TABS
500.0000 mg | ORAL_TABLET | Freq: Four times a day (QID) | ORAL | Status: DC | PRN
  Administered 2016-12-18 – 2016-12-19 (×2): 500 mg via ORAL
  Filled 2016-12-17 (×2): qty 1

## 2016-12-17 MED ORDER — BUPIVACAINE LIPOSOME 1.3 % IJ SUSP
INTRAMUSCULAR | Status: DC | PRN
  Administered 2016-12-17: 20 mL

## 2016-12-17 MED ORDER — SODIUM CHLORIDE 0.9 % IJ SOLN
INTRAMUSCULAR | Status: AC
  Filled 2016-12-17: qty 10

## 2016-12-17 MED ORDER — METOCLOPRAMIDE HCL 5 MG PO TABS
5.0000 mg | ORAL_TABLET | Freq: Three times a day (TID) | ORAL | Status: DC | PRN
Start: 2016-12-17 — End: 2016-12-19

## 2016-12-17 MED ORDER — MIDAZOLAM HCL 5 MG/ML IJ SOLN: 1.0000 mg | INTRAMUSCULAR | Status: DC | PRN

## 2016-12-17 MED ORDER — SODIUM CHLORIDE 0.9 % IV SOLN
INTRAVENOUS | Status: DC
  Administered 2016-12-17 – 2016-12-18 (×2): via INTRAVENOUS

## 2016-12-17 MED ORDER — RAMIPRIL 10 MG PO CAPS
10.0000 mg | ORAL_CAPSULE | Freq: Every day | ORAL | Status: DC
  Administered 2016-12-18 – 2016-12-19 (×2): 10 mg via ORAL
  Filled 2016-12-17 (×2): qty 1

## 2016-12-17 MED ORDER — FLEET ENEMA 7-19 GM/118ML RE ENEM: 1.0000 | ENEMA | Freq: Once | RECTAL | Status: DC | PRN

## 2016-12-17 MED ORDER — MIDAZOLAM HCL 2 MG/2ML IJ SOLN
INTRAMUSCULAR | Status: AC
  Filled 2016-12-17: qty 2

## 2016-12-17 MED ORDER — ACETAMINOPHEN 325 MG PO TABS: 650.0000 mg | ORAL_TABLET | Freq: Four times a day (QID) | ORAL | Status: DC | PRN

## 2016-12-17 MED ORDER — OXYCODONE HCL 5 MG PO TABS
5.0000 mg | ORAL_TABLET | ORAL | Status: DC | PRN
  Administered 2016-12-17 – 2016-12-18 (×8): 5 mg via ORAL
  Administered 2016-12-19 (×2): 10 mg via ORAL
  Filled 2016-12-17: qty 2
  Filled 2016-12-17: qty 1
  Filled 2016-12-17: qty 2
  Filled 2016-12-17: qty 1
  Filled 2016-12-17: qty 2
  Filled 2016-12-17 (×2): qty 1
  Filled 2016-12-17 (×2): qty 2

## 2016-12-17 MED ORDER — FUROSEMIDE 20 MG PO TABS
20.0000 mg | ORAL_TABLET | Freq: Every day | ORAL | Status: DC
  Administered 2016-12-18 – 2016-12-19 (×2): 20 mg via ORAL
  Filled 2016-12-17 (×2): qty 1

## 2016-12-17 MED ORDER — ONDANSETRON HCL 4 MG/2ML IJ SOLN
INTRAMUSCULAR | Status: AC
  Filled 2016-12-17: qty 2

## 2016-12-17 MED ORDER — PROPOFOL 10 MG/ML IV BOLUS
INTRAVENOUS | Status: DC | PRN
  Administered 2016-12-17: 15 mg via INTRAVENOUS

## 2016-12-17 MED ORDER — CEFAZOLIN SODIUM-DEXTROSE 2-4 GM/100ML-% IV SOLN: 2.0000 g | INTRAVENOUS | Status: DC

## 2016-12-17 MED ORDER — RIVAROXABAN 10 MG PO TABS
10.0000 mg | ORAL_TABLET | Freq: Every day | ORAL | Status: DC
  Administered 2016-12-18 – 2016-12-19 (×2): 10 mg via ORAL
  Filled 2016-12-17 (×2): qty 1

## 2016-12-17 MED ORDER — PROPOFOL 500 MG/50ML IV EMUL
INTRAVENOUS | Status: DC | PRN
  Administered 2016-12-17: 50 ug/kg/min via INTRAVENOUS

## 2016-12-17 MED ORDER — FENTANYL CITRATE (PF) 100 MCG/2ML IJ SOLN
50.0000 ug | INTRAMUSCULAR | Status: DC | PRN
  Administered 2016-12-17: 75 ug via INTRAVENOUS

## 2016-12-17 MED ORDER — CEFAZOLIN SODIUM-DEXTROSE 1-4 GM/50ML-% IV SOLN
1.0000 g | Freq: Four times a day (QID) | INTRAVENOUS | Status: AC
  Administered 2016-12-17 – 2016-12-18 (×2): 1 g via INTRAVENOUS
  Filled 2016-12-17 (×2): qty 50

## 2016-12-17 MED ORDER — CHLORHEXIDINE GLUCONATE 4 % EX LIQD: 60.0000 mL | Freq: Once | CUTANEOUS | Status: DC

## 2016-12-17 MED ORDER — ONDANSETRON HCL 4 MG/2ML IJ SOLN: 4.0000 mg | Freq: Four times a day (QID) | INTRAMUSCULAR | Status: DC | PRN

## 2016-12-17 MED ORDER — MORPHINE SULFATE (PF) 4 MG/ML IV SOLN: 1.0000 mg | INTRAVENOUS | Status: DC | PRN

## 2016-12-17 MED ORDER — CARVEDILOL 12.5 MG PO TABS
12.5000 mg | ORAL_TABLET | Freq: Two times a day (BID) | ORAL | Status: DC
Start: 2016-12-17 — End: 2016-12-19
  Administered 2016-12-17 – 2016-12-19 (×4): 12.5 mg via ORAL
  Filled 2016-12-17 (×4): qty 1

## 2016-12-17 MED ORDER — TRANEXAMIC ACID 1000 MG/10ML IV SOLN
1000.0000 mg | INTRAVENOUS | Status: AC
  Administered 2016-12-17: 1000 mg via INTRAVENOUS
  Filled 2016-12-17: qty 1100

## 2016-12-17 MED ORDER — MENTHOL 3 MG MT LOZG: 1.0000 | LOZENGE | OROMUCOSAL | Status: DC | PRN

## 2016-12-17 MED ORDER — PHENOL 1.4 % MT LIQD: 1.0000 | OROMUCOSAL | Status: DC | PRN

## 2016-12-17 MED ORDER — PROPOFOL 10 MG/ML IV BOLUS
INTRAVENOUS | Status: AC
  Filled 2016-12-17: qty 40

## 2016-12-17 MED ORDER — ONDANSETRON HCL 4 MG PO TABS: 4.0000 mg | ORAL_TABLET | Freq: Four times a day (QID) | ORAL | Status: DC | PRN

## 2016-12-17 MED ORDER — RAMIPRIL 10 MG PO CAPS
10.0000 mg | ORAL_CAPSULE | Freq: Once | ORAL | Status: AC
  Administered 2016-12-17: 10 mg via ORAL
  Filled 2016-12-17: qty 1

## 2016-12-17 MED ORDER — DOCUSATE SODIUM 100 MG PO CAPS
100.0000 mg | ORAL_CAPSULE | Freq: Two times a day (BID) | ORAL | Status: DC
  Administered 2016-12-17 – 2016-12-19 (×4): 100 mg via ORAL
  Filled 2016-12-17 (×4): qty 1

## 2016-12-17 MED ORDER — BISACODYL 10 MG RE SUPP: 10.0000 mg | Freq: Every day | RECTAL | Status: DC | PRN

## 2016-12-17 MED ORDER — ACETAMINOPHEN 10 MG/ML IV SOLN
1000.0000 mg | Freq: Once | INTRAVENOUS | Status: AC
  Administered 2016-12-17: 1000 mg via INTRAVENOUS

## 2016-12-17 MED ORDER — CEFAZOLIN SODIUM-DEXTROSE 2-4 GM/100ML-% IV SOLN
2.0000 g | INTRAVENOUS | Status: AC
  Administered 2016-12-17: 2 g via INTRAVENOUS

## 2016-12-17 MED ORDER — ONDANSETRON HCL 4 MG/2ML IJ SOLN
INTRAMUSCULAR | Status: DC | PRN
  Administered 2016-12-17: 4 mg via INTRAVENOUS

## 2016-12-17 MED ORDER — FENTANYL CITRATE (PF) 100 MCG/2ML IJ SOLN
INTRAMUSCULAR | Status: AC
  Administered 2016-12-17: 75 ug via INTRAVENOUS
  Filled 2016-12-17: qty 2

## 2016-12-17 MED ORDER — CEFAZOLIN SODIUM-DEXTROSE 2-4 GM/100ML-% IV SOLN
INTRAVENOUS | Status: AC
  Filled 2016-12-17: qty 100

## 2016-12-17 MED ORDER — ACETAMINOPHEN 10 MG/ML IV SOLN
INTRAVENOUS | Status: AC
  Filled 2016-12-17: qty 100

## 2016-12-17 MED ORDER — ROPIVACAINE HCL 5 MG/ML IJ SOLN
INTRAMUSCULAR | Status: DC | PRN
  Administered 2016-12-17: 30 mL via PERINEURAL

## 2016-12-17 MED ORDER — DIPHENHYDRAMINE HCL 12.5 MG/5ML PO ELIX: 12.5000 mg | ORAL_SOLUTION | ORAL | Status: DC | PRN

## 2016-12-17 MED ORDER — HYDROMORPHONE HCL-NACL 0.5-0.9 MG/ML-% IV SOSY: 0.2500 mg | PREFILLED_SYRINGE | INTRAVENOUS | Status: DC | PRN

## 2016-12-17 MED ORDER — ATORVASTATIN CALCIUM 20 MG PO TABS
20.0000 mg | ORAL_TABLET | Freq: Every day | ORAL | Status: DC
  Administered 2016-12-17 – 2016-12-18 (×2): 20 mg via ORAL
  Filled 2016-12-17 (×2): qty 1

## 2016-12-17 MED ORDER — INSULIN ASPART 100 UNIT/ML ~~LOC~~ SOLN
0.0000 [IU] | Freq: Three times a day (TID) | SUBCUTANEOUS | Status: DC
  Administered 2016-12-17: 3 [IU] via SUBCUTANEOUS
  Administered 2016-12-18 (×2): 5 [IU] via SUBCUTANEOUS
  Administered 2016-12-18: 3 [IU] via SUBCUTANEOUS
  Administered 2016-12-19: 2 [IU] via SUBCUTANEOUS

## 2016-12-17 MED ORDER — DEXAMETHASONE SODIUM PHOSPHATE 10 MG/ML IJ SOLN
10.0000 mg | Freq: Once | INTRAMUSCULAR | Status: AC
  Administered 2016-12-17: 10 mg via INTRAVENOUS

## 2016-12-17 MED ORDER — FUROSEMIDE 20 MG PO TABS
20.0000 mg | ORAL_TABLET | Freq: Once | ORAL | Status: AC
  Administered 2016-12-17: 20 mg via ORAL
  Filled 2016-12-17: qty 1

## 2016-12-17 MED ORDER — METOCLOPRAMIDE HCL 5 MG/ML IJ SOLN: 5.0000 mg | Freq: Three times a day (TID) | INTRAMUSCULAR | Status: DC | PRN

## 2016-12-17 MED ORDER — ACETAMINOPHEN 500 MG PO TABS
1000.0000 mg | ORAL_TABLET | Freq: Four times a day (QID) | ORAL | Status: AC
  Administered 2016-12-17 – 2016-12-18 (×4): 1000 mg via ORAL
  Filled 2016-12-17 (×4): qty 2

## 2016-12-17 MED ORDER — BUPIVACAINE IN DEXTROSE 0.75-8.25 % IT SOLN
INTRATHECAL | Status: DC | PRN
  Administered 2016-12-17: 1.6 mL via INTRATHECAL

## 2016-12-17 SURGICAL SUPPLY — 50 items

## 2016-12-17 NOTE — Progress Notes (Signed)
Orthopedic Tech Progress Note Patient Details:  Taylor Castaneda 02/22/1933 [id number]  CPM Left Knee CPM Left Knee: On Left Knee Flexion (Degrees): 40 Left Knee Extension (Degrees): 10 Additional Comments: Trapeze bar   [name] 12/17/2016, 3:17 PM

## 2016-12-17 NOTE — Anesthesia Procedure Notes (Signed)
Date/Time: 12/17/2016 11:07 AM Performed by: Glory Buff Oxygen Delivery Method: Nasal cannula

## 2016-12-17 NOTE — Discharge Instructions (Addendum)
° °Dr. Frank Aluisio °Total Joint Specialist °[facility] °[address] °[place], [place] [address] °[phone] ° °TOTAL KNEE REPLACEMENT POSTOPERATIVE DIRECTIONS ° °Knee Rehabilitation, Guidelines Following Surgery  °Results after knee surgery are often greatly improved when you follow the exercise, range of motion and muscle strengthening exercises prescribed by your doctor. Safety measures are also important to protect the knee from further injury. Any time any of these exercises cause you to have increased pain or swelling in your knee joint, decrease the amount until you are comfortable again and slowly increase them. If you have problems or questions, call your caregiver or physical therapist for advice.  ° °HOME CARE INSTRUCTIONS  °Remove items at home which could result in a fall. This includes throw rugs or furniture in walking pathways.  °· ICE to the affected knee every three hours for 30 minutes at a time and then as needed for pain and swelling.  Continue to use ice on the knee for pain and swelling from surgery. You may notice swelling that will progress down to the foot and ankle.  This is normal after surgery.  Elevate the leg when you are not up walking on it.   °· Continue to use the breathing machine which will help keep your temperature down.  It is common for your temperature to cycle up and down following surgery, especially at night when you are not up moving around and exerting yourself.  The breathing machine keeps your lungs expanded and your temperature down. °· Do not place pillow under knee, focus on keeping the knee straight while resting ° °DIET °You may resume your previous home diet once your are discharged from the hospital. ° °DRESSING / WOUND CARE / SHOWERING °You may start showering once you are discharged home but do not submerge the incision under water. Just pat the incision dry and apply a dry gauze dressing on daily. °Change the surgical dressing  daily and reapply a dry dressing each time. ° °ACTIVITY °Walk with your walker as instructed. °Use walker as long as suggested by your caregivers. °Avoid periods of inactivity such as sitting longer than an hour when not asleep. This helps prevent blood clots.  °You may resume a sexual relationship in one month or when given the OK by your doctor.  °You may return to work once you are cleared by your doctor.  °Do not drive a car for 6 weeks or until released by you surgeon.  °Do not drive while taking narcotics. ° °WEIGHT BEARING °Weight bearing as tolerated with assist device (walker, cane, etc) as directed, use it as long as suggested by your surgeon or therapist, typically at least 4-6 weeks. ° °POSTOPERATIVE CONSTIPATION PROTOCOL °Constipation - defined medically as fewer than three stools per week and severe constipation as less than one stool per week. ° °One of the most common issues patients have following surgery is constipation.  Even if you have a regular bowel pattern at home, your normal regimen is likely to be disrupted due to multiple reasons following surgery.  Combination of anesthesia, postoperative narcotics, change in appetite and fluid intake all can affect your bowels.  In order to avoid complications following surgery, here are some recommendations in order to help you during your recovery period. ° °Colace (docusate) - Pick up an over-the-counter form of Colace or another stool softener and take twice a day as long as you are requiring postoperative pain medications.  Take with a full glass of water   daily.  If you experience loose stools or diarrhea, hold the colace until you stool forms back up.  If your symptoms do not get better within 1 week or if they get worse, check with your doctor. ° °Dulcolax (bisacodyl) - Pick up over-the-counter and take as directed by the product packaging as needed to assist with the movement of your bowels.  Take with a full glass of water.  Use this product as  needed if not relieved by Colace only.  ° °MiraLax (polyethylene glycol) - Pick up over-the-counter to have on hand.  MiraLax is a solution that will increase the amount of water in your bowels to assist with bowel movements.  Take as directed and can mix with a glass of water, juice, soda, coffee, or tea.  Take if you go more than two days without a movement. °Do not use MiraLax more than once per day. Call your doctor if you are still constipated or irregular after using this medication for 7 days in a row. ° °If you continue to have problems with postoperative constipation, please contact the office for further assistance and recommendations.  If you experience "the worst abdominal pain ever" or develop nausea or vomiting, please contact the office immediatly for further recommendations for treatment. ° °ITCHING ° If you experience itching with your medications, try taking only a single pain pill, or even half a pain pill at a time.  You can also use Benadryl over the counter for itching or also to help with sleep.  ° °TED HOSE STOCKINGS °Wear the elastic stockings on both legs for three weeks following surgery during the day but you may remove then at night for sleeping. ° °MEDICATIONS °See your medication summary on the “After Visit Summary” that the nursing staff will review with you prior to discharge.  You may have some home medications which will be placed on hold until you complete the course of blood thinner medication.  It is important for you to complete the blood thinner medication as prescribed by your surgeon.  Continue your approved medications as instructed at time of discharge. ° °PRECAUTIONS °If you experience chest pain or shortness of breath - call 911 immediately for transfer to the hospital emergency department.  °If you develop a fever greater that 101 F, purulent drainage from wound, increased redness or drainage from wound, foul odor from the wound/dressing, or calf pain - CONTACT YOUR  SURGEON.   °                                                °FOLLOW-UP APPOINTMENTS °Make sure you keep all of your appointments after your operation with your surgeon and caregivers. You should call the office at the above phone number and make an appointment for approximately two weeks after the date of your surgery or on the date instructed by your surgeon outlined in the "After Visit Summary". ° ° °RANGE OF MOTION AND STRENGTHENING EXERCISES  °Rehabilitation of the knee is important following a knee injury or an operation. After just a few days of immobilization, the muscles of the thigh which control the knee become weakened and shrink (atrophy). Knee exercises are designed to build up the tone and strength of the thigh muscles and to improve knee motion. Often times heat used for twenty to thirty minutes before working out will loosen   up your tissues and help with improving the range of motion but do not use heat for the first two weeks following surgery. These exercises can be done on a training (exercise) mat, on the floor, on a table or on a bed. Use what ever works the best and is most comfortable for you Knee exercises include:  °Leg Lifts - While your knee is still immobilized in a splint or cast, you can do straight leg raises. Lift the leg to 60 degrees, hold for 3 sec, and slowly lower the leg. Repeat 10-20 times 2-3 times daily. Perform this exercise against resistance later as your knee gets better.  °Quad and Hamstring Sets - Tighten up the muscle on the front of the thigh (Quad) and hold for 5-10 sec. Repeat this 10-20 times hourly. Hamstring sets are done by pushing the foot backward against an object and holding for 5-10 sec. Repeat as with quad sets.  °· Leg Slides: Lying on your back, slowly slide your foot toward your buttocks, bending your knee up off the floor (only go as far as is comfortable). Then slowly slide your foot back down until your leg is flat on the floor again. °· Angel Wings:  Lying on your back spread your legs to the side as far apart as you can without causing discomfort.  °A rehabilitation program following serious knee injuries can speed recovery and prevent re-injury in the future due to weakened muscles. Contact your doctor or a physical therapist for more information on knee rehabilitation.  ° °IF YOU ARE TRANSFERRED TO A SKILLED REHAB FACILITY °If the patient is transferred to a skilled rehab facility following release from the hospital, a list of the current medications will be sent to the facility for the patient to continue.  When discharged from the skilled rehab facility, please have the facility set up the patient's Home Health Physical Therapy prior to being released. Also, the skilled facility will be responsible for providing the patient with their medications at time of release from the facility to include their pain medication, the muscle relaxants, and their blood thinner medication. If the patient is still at the rehab facility at time of the two week follow up appointment, the skilled rehab facility will also need to assist the patient in arranging follow up appointment in our office and any transportation needs. ° °MAKE SURE YOU:  °Understand these instructions.  °Get help right away if you are not doing well or get worse.  ° ° °Pick up stool softner and laxative for home use following surgery while on pain medications. °Do not submerge incision under water. °Please use good hand washing techniques while changing dressing each day. °May shower starting three days after surgery. °Please use a clean towel to pat the incision dry following showers. °Continue to use ice for pain and swelling after surgery. °Do not use any lotions or creams on the incision until instructed by your surgeon. ° ° °Information on my medicine - XARELTO® (Rivaroxaban) ° °Why was Xarelto® prescribed for you? °Xarelto® was prescribed for you to reduce the risk of blood clots forming after  orthopedic surgery. The medical term for these abnormal blood clots is venous thromboembolism (VTE). ° °What do you need to know about xarelto® ? °Take your Xarelto® ONCE DAILY at the same time every day. °You may take it either with or without food. ° °If you have difficulty swallowing the tablet whole, you may crush it and mix in applesauce just prior to   taking your dose. ° °Take Xarelto® exactly as prescribed by your doctor and DO NOT stop taking Xarelto® without talking to the doctor who prescribed the medication.  Stopping without other VTE prevention medication to take the place of Xarelto® may increase your risk of developing a clot. ° °After discharge, you should have regular check-up appointments with your healthcare provider that is prescribing your Xarelto®.   ° °What do you do if you miss a dose? °If you miss a dose, take it as soon as you remember on the same day then continue your regularly scheduled once daily regimen the next day. Do not take two doses of Xarelto® on the same day.  ° °Important Safety Information °A possible side effect of Xarelto® is bleeding. You should call your healthcare provider right away if you experience any of the following: °? Bleeding from an injury or your nose that does not stop. °? Unusual colored urine (red or dark brown) or unusual colored stools (red or black). °? Unusual bruising for unknown reasons. °? A serious fall or if you hit your head (even if there is no bleeding). ° °Some medicines may interact with Xarelto® and might increase your risk of bleeding while on Xarelto®. To help avoid this, consult your healthcare provider or pharmacist prior to using any new prescription or non-prescription medications, including herbals, vitamins, non-steroidal anti-inflammatory drugs (NSAIDs) and supplements. ° °This website has more information on Xarelto®: www.xarelto.com. °

## 2016-12-17 NOTE — Anesthesia Procedure Notes (Signed)
Anesthesia Procedure Image    

## 2016-12-17 NOTE — Op Note (Signed)
OPERATIVE REPORT-TOTAL KNEE ARTHROPLASTY   Pre-operative diagnosis- Osteoarthritis  Left knee(s)  Post-operative diagnosis- Osteoarthritis Left knee(s)  Procedure-  Left  Total Knee Arthroplasty  Surgeon- Taylor Plover. Tommy Minichiello, MD  Assistant- Marcene Brawn, PA-C   Anesthesia-  Adductor canal block and spinal  EBL-* No blood loss amount entered *   Drains Hemovac  Tourniquet time- 29 minutes @ 193 mm Hg  Complications- None  Condition-PACU - hemodynamically stable.   Brief Clinical Note  Taylor Castaneda is a 83 y.o. year old female with end stage OA of her left knee with progressively worsening pain and dysfunction. She has constant pain, with activity and at rest and significant functional deficits with difficulties even with ADLs. She has had extensive non-op management including analgesics, injections of cortisone and viscosupplements, and home exercise program, but remains in significant pain with significant dysfunction. Radiographs show bone on bone arthritis medial and patellofemoral. She presents now for left Total Knee Arthroplasty.    Procedure in detail---   The patient is brought into the operating room and positioned supine on the operating table. After successful administration of  Adductor canal block and spinal,   a tourniquet is placed high on the  Left thigh(s) and the lower extremity is prepped and draped in the usual sterile fashion. Time out is performed by the operating team and then the  Left lower extremity is wrapped in Esmarch, knee flexed and the tourniquet inflated to 300 mmHg.       A midline incision is made with a ten blade through the subcutaneous tissue to the level of the extensor mechanism. A fresh blade is used to make a medial parapatellar arthrotomy. Soft tissue over the proximal medial tibia is subperiosteally elevated to the joint line with a knife and into the semimembranosus bursa with a Cobb elevator. Soft tissue over the proximal lateral tibia  is elevated with attention being paid to avoiding the patellar tendon on the tibial tubercle. The patella is everted, knee flexed 90 degrees and the ACL and PCL are removed. Findings are bone on bone medial and patellofemoral with large global osteophytes.        The drill is used to create a starting hole in the distal femur and the canal is thoroughly irrigated with sterile saline to remove the fatty contents. The 5 degree Left  valgus alignment guide is placed into the femoral canal and the distal femoral cutting block is pinned to remove 10 mm off the distal femur. Resection is made with an oscillating saw.      The tibia is subluxed forward and the menisci are removed. The extramedullary alignment guide is placed referencing proximally at the medial aspect of the tibial tubercle and distally along the second metatarsal axis and tibial crest. The block is pinned to remove 70mm off the more deficient medial  side. Resection is made with an oscillating saw. Size 2.5is the most appropriate size for the tibia and the proximal tibia is prepared with the modular drill and keel punch for that size.      The femoral sizing guide is placed and size 3 is most appropriate. Rotation is marked off the epicondylar axis and confirmed by creating a rectangular flexion gap at 90 degrees. The size 3 cutting block is pinned in this rotation and the anterior, posterior and chamfer cuts are made with the oscillating saw. The intercondylar block is then placed and that cut is made.      Trial size 2.5 tibial  component, trial size 3 posterior stabilized femur and a 12.5  mm posterior stabilized rotating platform insert trial is placed. Full extension is achieved with excellent varus/valgus and anterior/posterior balance throughout full range of motion. The patella is everted and thickness measured to be 22  mm. Free hand resection is taken to 12 mm, a 35 template is placed, lug holes are drilled, trial patella is placed, and it  tracks normally. Osteophytes are removed off the posterior femur with the trial in place. All trials are removed and the cut bone surfaces prepared with pulsatile lavage. Cement is mixed and once ready for implantation, the size 2.5 tibial implant, size  3 posterior stabilized femoral component, and the size 35 patella are cemented in place and the patella is held with the clamp. The trial insert is placed and the knee held in full extension. The Exparel (20 ml mixed with 60 ml saline) is injected into the extensor mechanism, posterior capsule, medial and lateral gutters and subcutaneous tissues.  All extruded cement is removed and once the cement is hard the permanent 12.5 mm posterior stabilized rotating platform insert is placed into the tibial tray.      The wound is copiously irrigated with saline solution and the extensor mechanism closed over a hemovac drain with #1 V-loc suture. The tourniquet is released for a total tourniquet time of 29  minutes. Flexion against gravity is 135 degrees and the patella tracks normally. Subcutaneous tissue is closed with 2.0 vicryl and subcuticular with running 4.0 Monocryl. The incision is cleaned and dried and steri-strips and a bulky sterile dressing are applied. The limb is placed into a knee immobilizer and the patient is awakened and transported to recovery in stable condition.      Please note that a surgical assistant was a medical necessity for this procedure in order to perform it in a safe and expeditious manner. Surgical assistant was necessary to retract the ligaments and vital neurovascular structures to prevent injury to them and also necessary for proper positioning of the limb to allow for anatomic placement of the prosthesis.   Taylor Plover Samiksha Pellicano, MD    12/17/2016, 12:13 PM

## 2016-12-17 NOTE — Transfer of Care (Signed)
Immediate Anesthesia Transfer of Care Note  Patient: Taylor Castaneda  Procedure(s) Performed: Procedure(s): LEFT TOTAL KNEE ARTHROPLASTY (Left)  Patient Location: PACU  Anesthesia Type:Spinal  Level of Consciousness: awake, alert  and oriented  Airway & Oxygen Therapy: Patient Spontanous Breathing and Patient connected to nasal cannula oxygen  Post-op Assessment: Report given to RN and Post -op Vital signs reviewed and stable  Post vital signs: Reviewed and stable  Last Vitals:  Vitals:   12/17/16 1034 12/17/16 1037  BP:  (!) 215/81  Pulse: 68 68  Resp: (!) 9 14    Last Pain:  Vitals:   12/17/16 0940  PainSc: 3       Patients Stated Pain Goal: 3 (19/41/74 0814)  Complications: No apparent anesthesia complications

## 2016-12-17 NOTE — Interval H&P Note (Signed)
History and Physical Interval Note:  12/17/2016 10:12 AM  Taylor Castaneda  has presented today for surgery, with the diagnosis of Osteoarthritis Left Knee  The various methods of treatment have been discussed with the patient and family. After consideration of risks, benefits and other options for treatment, the patient has consented to  Procedure(s): LEFT TOTAL KNEE ARTHROPLASTY (Left) as a surgical intervention .  The patient's history has been reviewed, patient examined, no change in status, stable for surgery.  I have reviewed the patient's chart and labs.  Questions were answered to the patient's satisfaction.     Gearlean Alf

## 2016-12-17 NOTE — Evaluation (Signed)
Physical Therapy Evaluation Patient Details Name: Taylor Castaneda MRN: [id number] DOB: 1932-11-07 Today's Date: 12/17/2016   History of Present Illness  Pt is an 83 year old female s/p L TKA  Clinical Impression  Pt is s/p TKA resulting in the deficits listed below (see PT Problem List).  Pt will benefit from skilled PT to increase their independence and safety with mobility to allow discharge to the venue listed below.  Pt assisted OOB to recliner POD #0 and plans to d/c back to her condo with her son assisting.  Deferred ambulation at this time due to high BP (RN aware and agreeable to mobility).     Follow Up Recommendations Outpatient PT;DC plan and follow up therapy as arranged by surgeon    Equipment Recommendations  Rolling walker with 5" wheels    Recommendations for Other Services       Precautions / Restrictions Precautions Precautions: Fall;Knee Required Braces or Orthoses: Knee Immobilizer - Left Restrictions Weight Bearing Restrictions: No Other Position/Activity Restrictions: WBAT      Mobility  Bed Mobility Overal bed mobility: Needs Assistance Bed Mobility: Supine to Sit     Supine to sit: Min assist;HOB elevated     General bed mobility comments: assist for trunk upright, pt received a hand to self assist  Transfers Overall transfer level: Needs assistance Equipment used: Rolling walker (2 wheeled) Transfers: Sit to/from [facility] Sit to Stand: Min assist;From elevated surface Stand pivot transfers: Min guard       General transfer comment: verbal cues for UE and LE positioning, assist to rise and steady  Ambulation/Gait                Stairs            Wheelchair Mobility    Modified Rankin (Stroke Patients Only)       Balance                                             Pertinent Vitals/Pain Pain Assessment: 0-10 Pain Score: 1  Pain Location: L knee Pain Descriptors / Indicators:  Sore Pain Intervention(s): Monitored during session;Limited activity within patient's tolerance;Repositioned    Home Living Family/patient expects to be discharged to:: Private residence Living Arrangements: Alone Available Help at Discharge: Family;Available 24 hours/day (son) Type of Home: Other(Comment) (Condo) Home Access: Elevator     Home Layout: One level Home Equipment: None      Prior Function Level of Independence: Independent               Hand Dominance        Extremity/Trunk Assessment        Lower Extremity Assessment Lower Extremity Assessment: LLE deficits/detail LLE Deficits / Details: able to perform SLR, utilized KI, ROM TBA       Communication   Communication: No difficulties  Cognition Arousal/Alertness: Awake/alert Behavior During Therapy: WFL for tasks assessed/performed Overall Cognitive Status: Within Functional Limits for tasks assessed                                        General Comments      Exercises     Assessment/Plan    PT Assessment Patient needs continued PT services  PT Problem List Decreased strength;Decreased range  of motion;Decreased mobility;Decreased knowledge of use of DME;Decreased activity tolerance;Pain;Decreased knowledge of precautions       PT Treatment Interventions Functional mobility training;Gait training;Therapeutic exercise;Therapeutic activities;Stair training;DME instruction;Patient/family education    PT Goals (Current goals can be found in the Care Plan section)  Acute Rehab PT Goals PT Goal Formulation: With patient Time For Goal Achievement: 12/22/16 Potential to Achieve Goals: Good    Frequency 7X/week   Barriers to discharge        Co-evaluation               AM-PAC PT "6 Clicks" Daily Activity  Outcome Measure Difficulty turning over in bed (including adjusting bedclothes, sheets and blankets)?: A Little Difficulty moving from lying on back to sitting  on the side of the bed? : Total Difficulty sitting down on and standing up from a chair with arms (e.g., wheelchair, bedside commode, etc,.)?: Total Help needed moving to and from a bed to chair (including a wheelchair)?: A Lot Help needed walking in hospital room?: A Lot Help needed climbing 3-5 steps with a railing? : Total 6 Click Score: 10    End of Session Equipment Utilized During Treatment: Gait belt;Left knee immobilizer Activity Tolerance: Patient tolerated treatment well Patient left: in chair;with call bell/phone within reach;with family/visitor present Nurse Communication: Mobility status PT Visit Diagnosis: Difficulty in walking, not elsewhere classified (R26.2)    Time: 1646-1700 PT Time Calculation (min) (ACUTE ONLY): 14 min   Charges:   PT Evaluation $PT Eval Low Complexity: 1 Procedure     PT G CodesCarmelia Bake, PT, DPT 12/17/2016 Pager: [phone]   York Ram E 12/17/2016, 5:26 PM

## 2016-12-17 NOTE — Progress Notes (Signed)
Assisted Dr. Kalman Shan with left, ultrasound guided, adductor canal block. Side rails up, monitors on throughout procedure. See vital signs in flow sheet. Tolerated Procedure well.

## 2016-12-17 NOTE — Anesthesia Preprocedure Evaluation (Addendum)
Anesthesia Evaluation  Patient identified by MRN, date of birth, ID band Patient awake    Reviewed: Allergy & Precautions, NPO status , Patient's Chart, lab work & pertinent test results  Airway Mallampati: II  TM Distance: >3 FB Neck ROM: Full    Dental no notable dental hx.    Pulmonary neg pulmonary ROS, former smoker,    Pulmonary exam normal breath sounds clear to auscultation       Cardiovascular hypertension, Normal cardiovascular exam Rhythm:Regular Rate:Normal     Neuro/Psych negative neurological ROS  negative psych ROS   GI/Hepatic negative GI ROS, Neg liver ROS,   Endo/Other  negative endocrine ROSdiabetes  Renal/GU Renal InsufficiencyRenal disease  negative genitourinary   Musculoskeletal negative musculoskeletal ROS (+)   Abdominal   Peds negative pediatric ROS (+)  Hematology negative hematology ROS (+)   Anesthesia Other Findings   Reproductive/Obstetrics negative OB ROS                             Anesthesia Physical Anesthesia Plan  ASA: II  Anesthesia Plan: Spinal   Post-op Pain Management:  Regional for Post-op pain   Induction: Intravenous  PONV Risk Score and Plan: 1 and Ondansetron and Dexamethasone  Airway Management Planned: Simple Face Mask  Additional Equipment:   Intra-op Plan:   Post-operative Plan: Extubation in OR  Informed Consent: I have reviewed the patients History and Physical, chart, labs and discussed the procedure including the risks, benefits and alternatives for the proposed anesthesia with the patient or authorized representative who has indicated his/her understanding and acceptance.   Dental advisory given  Plan Discussed with: CRNA and Surgeon  Anesthesia Plan Comments:         Anesthesia Quick Evaluation

## 2016-12-17 NOTE — Progress Notes (Signed)
Dr Kalman Shan aware of BP.  States pt may cont to go to room.  Instructed to ask floor to administer daily BP med Coreg asap.

## 2016-12-17 NOTE — Anesthesia Procedure Notes (Signed)
Spinal  Patient location during procedure: OR Start time: 12/17/2016 11:14 AM End time: 12/17/2016 11:18 AM Staffing Anesthesiologist: ROSE, Iona Beard Resident/CRNA: Glory Buff Performed: resident/CRNA  Preanesthetic Checklist Completed: patient identified, site marked, surgical consent, pre-op evaluation, timeout performed, IV checked, risks and benefits discussed and monitors and equipment checked Spinal Block Patient position: sitting Prep: DuraPrep Patient monitoring: heart rate, continuous pulse ox and blood pressure Approach: midline Location: L2-3 Injection technique: single-shot Needle Needle type: Quincke  Needle gauge: 22 G Needle length: 9 cm Needle insertion depth: 5 cm Assessment Sensory level: T6 Additional Notes Kit expiration date checked and verified.  -heme, -paraesthesia, +CSF pre and post injection.  Patient tolerated well.

## 2016-12-17 NOTE — H&P (View-Only) (Signed)
Patient will need a repeat Hgb A1c on July 10th with her preop labs.  She had one recently but needs to be rechecked prior to surgery to make sure that her level is improving. Arlee Muslim, PA-C

## 2016-12-17 NOTE — Anesthesia Procedure Notes (Signed)
Anesthesia Regional Block: Adductor canal block   Pre-Anesthetic Checklist: ,, timeout performed, Correct Patient, Correct Site, Correct Laterality, Correct Procedure, Correct Position, site marked, Risks and benefits discussed,  Surgical consent,  Pre-op evaluation,  At surgeon's request and post-op pain management  Laterality: Left  Prep: chloraprep       Needles:  Injection technique: Single-shot  Needle Type: Echogenic Needle     Needle Length: 9cm      Additional Needles:   Procedures: ultrasound guided,,,,,,,,  Narrative:  Start time: 12/17/2016 10:30 AM End time: 12/17/2016 10:39 AM Injection made incrementally with aspirations every 5 mL.  Performed by: Personally  Anesthesiologist: Teleshia Lemere  Additional Notes: Patient tolerated the procedure well without complications

## 2016-12-17 NOTE — Anesthesia Postprocedure Evaluation (Signed)
Anesthesia Post Note  Patient: Taylor Castaneda  Procedure(s) Performed: Procedure(s) (LRB): LEFT TOTAL KNEE ARTHROPLASTY (Left)     Patient location during evaluation: PACU Anesthesia Type: Spinal Level of consciousness: oriented and awake and alert Pain management: pain level controlled Vital Signs Assessment: post-procedure vital signs reviewed and stable Respiratory status: spontaneous breathing, respiratory function stable and patient connected to nasal cannula oxygen Cardiovascular status: blood pressure returned to baseline and stable Postop Assessment: no headache and no backache Anesthetic complications: no    Last Vitals:  Vitals:   12/17/16 1403 12/17/16 1501  BP: (!) 193/75 (!) 207/75  Pulse:  66  Resp: 16 16  Temp: (!) 36.3 C (!) 36.4 C    Last Pain:  Vitals:   12/17/16 1501  TempSrc: Oral  PainSc:                  Kamera Dubas S

## 2016-12-18 LAB — BASIC METABOLIC PANEL
ANION GAP: 10 (ref 5–15)
BUN: 31 mg/dL — ABNORMAL HIGH (ref 6–20)
CO2: 19 mmol/L — ABNORMAL LOW (ref 22–32)
Calcium: 8.4 mg/dL — ABNORMAL LOW (ref 8.9–10.3)
Chloride: 106 mmol/L (ref 101–111)
Creatinine, Ser: 1.27 mg/dL — ABNORMAL HIGH (ref 0.44–1.00)
GFR, EST AFRICAN AMERICAN: 44 mL/min — AB (ref >60)
GFR, EST NON AFRICAN AMERICAN: 38 mL/min — AB (ref >60)
Glucose, Bld: 263 mg/dL — ABNORMAL HIGH (ref 65–99)
POTASSIUM: 4.8 mmol/L (ref 3.5–5.1)
SODIUM: 135 mmol/L (ref 135–145)

## 2016-12-18 LAB — CBC
HCT: 31 % — ABNORMAL LOW (ref 36.0–46.0)
Hemoglobin: 10.7 g/dL — ABNORMAL LOW (ref 12.0–15.0)
MCH: 30.9 pg (ref 26.0–34.0)
MCHC: 34.5 g/dL (ref 30.0–36.0)
MCV: 89.6 fL (ref 78.0–100.0)
PLATELETS: 168 10*3/uL (ref 150–400)
RBC: 3.46 MIL/uL — AB (ref 3.87–5.11)
RDW: 12.8 % (ref 11.5–15.5)
WBC: 9.7 10*3/uL (ref 4.0–10.5)

## 2016-12-18 LAB — GLUCOSE, CAPILLARY
GLUCOSE-CAPILLARY: 232 mg/dL — AB (ref 65–99)
GLUCOSE-CAPILLARY: 194 mg/dL — AB (ref 65–99)
GLUCOSE-CAPILLARY: 147 mg/dL — AB (ref 65–99)
Glucose-Capillary: 244 mg/dL — ABNORMAL HIGH (ref 65–99)

## 2016-12-18 MED ORDER — OXYCODONE HCL 5 MG PO TABS: 5.0000 mg | ORAL_TABLET | ORAL | 0 refills | Status: DC | PRN

## 2016-12-18 MED ORDER — TIZANIDINE HCL 4 MG PO CAPS: 4.0000 mg | ORAL_CAPSULE | Freq: Three times a day (TID) | ORAL | 0 refills | Status: DC | PRN

## 2016-12-18 MED ORDER — TRAMADOL HCL 50 MG PO TABS: 50.0000 mg | ORAL_TABLET | Freq: Four times a day (QID) | ORAL | 0 refills | Status: DC | PRN

## 2016-12-18 MED ORDER — RIVAROXABAN 10 MG PO TABS: 10.0000 mg | ORAL_TABLET | Freq: Every day | ORAL | 0 refills | Status: DC

## 2016-12-18 NOTE — Progress Notes (Addendum)
Occupational Therapy Evaluation Patient Details Name: Taylor Castaneda MRN: [id number] DOB: 10/29/1932 Today's Date: 12/18/2016    History of Present Illness Pt is an 83 year old female s/p L TKA   Clinical Impression   PTA, pt lived alone and was independent with ADL and mobility. Previously active, driving and enjoyed water aerobics and walking. BP 153/56 after ADL and mobilizing to chair. Pt with min c/o dizziness. Will follow acutely to complete education on ADL and functional mobility for ADL to facilitate safe DC home with initial 24/7 S of son.     Follow Up Recommendations  No OT follow up;Supervision/Assistance - 24 hour (initially)    Equipment Recommendations  Other (comment) (youth RW)    Recommendations for Other Services       Precautions / Restrictions Precautions Precautions: Fall;Knee Required Braces or Orthoses: Knee Immobilizer - Left Restrictions Weight Bearing Restrictions: No Other Position/Activity Restrictions: WBAT      Mobility Bed Mobility Overal bed mobility: Modified Independent                Transfers Overall transfer level: Needs assistance Equipment used: Rolling walker (2 wheeled) Transfers: Sit to/from [employer] Transfers Sit to Stand: From elevated surface;Min guard Stand pivot transfers: Min guard       General transfer comment: vc for hand placement    Balance                                           ADL either performed or assessed with clinical judgement   ADL Overall ADL's : Needs assistance/impaired     Grooming: Set up;Sitting   Upper Body Bathing: Set up;Sitting   Lower Body Bathing: Minimal assistance;Sit to/from stand   Upper Body Dressing : Set up;Sitting   Lower Body Dressing: Minimal assistance;Sit to/from stand   Toilet Transfer: Minimal assistance;RW     Toileting - Clothing Manipulation Details (indicate cue type and reason): foley     Functional mobility during  ADLs: Rolling walker;Cueing for safety;Min guard General ADL Comments: Able to reach feet but trying to cross legs. Educated on need to bend forward to complete ADL at this time and avoid twisting of knee  Began education on home safety, home set up to maximize independence and reducing risk of falls; Educated pt on shower tranfer technique - will need to practice. Plans for son to be with her after DC     Vision Baseline Vision/History: Wears glasses Wears Glasses: Reading only       Perception     Praxis      Pertinent Vitals/Pain Pain Assessment: 0-10 Pain Score: 1  Pain Location: L knee Pain Descriptors / Indicators: Sore Pain Intervention(s): Limited activity within patient's tolerance;Repositioned     Hand Dominance Right   Extremity/Trunk Assessment Upper Extremity Assessment Upper Extremity Assessment: Overall WFL for tasks assessed   Lower Extremity Assessment Lower Extremity Assessment: Defer to PT evaluation LLE Deficits / Details: able to perform SLR   Cervical / Trunk Assessment Cervical / Trunk Assessment: Normal   Communication Communication Communication: No difficulties   Cognition Arousal/Alertness: Awake/alert Behavior During Therapy: WFL for tasks assessed/performed Overall Cognitive Status: Within Functional Limits for tasks assessed; STM deficits?  General Comments       Exercises     Shoulder Instructions      Home Living Family/patient expects to be discharged to:: Private residence Living Arrangements: Alone Available Help at Discharge: Family;Available 24 hours/day (son) Type of Home: Other(Comment) (Condo) Home Access: Elevator;Stairs to enter (1 step onto sidewalk)     Home Layout: One level     Bathroom Shower/Tub: Occupational psychologist: Handicapped height Bathroom Accessibility: Yes How Accessible: Accessible via wheelchair Home Equipment: Shower seat;Walker -  standard;Cane - single point;Wheelchair - manual;Bedside commode          Prior Functioning/Environment Level of Independence: Independent        Comments: drives; active; did water aerobics        OT Problem List: Decreased range of motion;Decreased knowledge of use of DME or AE;Decreased knowledge of precautions;Pain      OT Treatment/Interventions: Self-care/ADL training;DME and/or AE instruction;Therapeutic activities;Patient/family education    OT Goals(Current goals can be found in the care plan section) Acute Rehab OT Goals Patient Stated Goal: to get back to walking and doing aerobics OT Goal Formulation: With patient Time For Goal Achievement: 01/01/17 Potential to Achieve Goals: Good  OT Frequency: Min 2X/week   Barriers to D/C:            Co-evaluation              AM-PAC PT "6 Clicks" Daily Activity     Outcome Measure Help from another person eating meals?: None Help from another person taking care of personal grooming?: None Help from another person toileting, which includes using toliet, bedpan, or urinal?: A Little Help from another person bathing (including washing, rinsing, drying)?: A Little Help from another person to put on and taking off regular upper body clothing?: None Help from another person to put on and taking off regular lower body clothing?: A Little 6 Click Score: 21   End of Session Equipment Utilized During Treatment: Rolling walker CPM Left Knee CPM Left Knee: Off Additional Comments: Trapeze bar Nurse Communication: Mobility status  Activity Tolerance: Patient tolerated treatment well Patient left: in chair;with call bell/phone within reach  OT Visit Diagnosis: Other abnormalities of gait and mobility (R26.89);Pain Pain - Right/Left: Left Pain - part of body: Knee                Time: 1093-2355 OT Time Calculation (min): 25 min Charges:  OT General Charges $OT Visit: 1 Procedure OT Evaluation $OT Eval Low  Complexity: 1 Procedure OT Treatments $Self Care/Home Management : 8-22 mins G-Codes:     [facility], OT/L  [phone] 12/18/2016  Germain Koopmann,HILLARY 12/18/2016, 9:18 AM

## 2016-12-18 NOTE — Progress Notes (Signed)
Orthopedic Tech Progress Note Patient Details:  Taylor Castaneda 1932/11/09 [id number]  Patient ID: Taylor Castaneda, female   DOB: Oct 01, 1932, 83 y.o.   MRN: [id number]   [name] 12/18/2016, 8:32 PMPatient CPM log, Pt in CPM from 1415 till 1630 and 1830 till 2030, for a total of four hours 15 minutes.

## 2016-12-18 NOTE — Progress Notes (Signed)
Discharge plan:  Pt states she has been set up for outpatient PT at [facility] this Friday 12/21/16. Pt states she has RW, 3in1, Wheelchair and shower chair at home.  Marney Doctor RN,BSN,NCM [phone]

## 2016-12-18 NOTE — Progress Notes (Signed)
Physical Therapy Treatment Patient Details Name: Taylor Castaneda MRN: [id number] DOB: 07-11-1932 Today's Date: 12/18/2016    History of Present Illness Pt is an 83 year old female s/p L TKA    PT Comments    POD # 1 am session Pt OOB in recliner via OT.  Applied KI and instructed pt on use.  Assisted with amb a limited distance due to fatigue.  Pain min.  Returned to room and performed some TKR TE's followed by ICE.    Follow Up Recommendations  Outpatient PT;DC plan and follow up therapy as arranged by surgeon     Equipment Recommendations  Rolling walker with 5" wheels (youth)    Recommendations for Other Services       Precautions / Restrictions Precautions Precautions: Fall;Knee Precaution Comments: instructed on KI use for amb and stairs Required Braces or Orthoses: Knee Immobilizer - Left Restrictions Weight Bearing Restrictions: No Other Position/Activity Restrictions: WBAT    Mobility  Bed Mobility               General bed mobility comments: OOB in recliner   Transfers Overall transfer level: Needs assistance Equipment used: Rolling walker (2 wheeled) Transfers: Sit to/from [employer] Sit to Stand: Supervision;Min guard Stand pivot transfers: Supervision;Min guard       General transfer comment: 25% vc for hand placement and increased time  Ambulation/Gait Ambulation/Gait assistance: Min guard;Min assist Ambulation Distance (Feet): 28 Feet Assistive device: Rolling walker (2 wheeled) Gait Pattern/deviations: Step-to pattern;Step-through pattern;Decreased stance time - left;Trunk flexed Gait velocity: decreased   General Gait Details: 25% VC's on proper walker to self distance and to increase stance time on LE to increase equal gait   Stairs            Wheelchair Mobility    Modified Rankin (Stroke Patients Only)       Balance                                            Cognition  Arousal/Alertness: Awake/alert Behavior During Therapy: WFL for tasks assessed/performed Overall Cognitive Status: Within Functional Limits for tasks assessed                                        Exercises   Total Knee Replacement TE's 10 reps B LE ankle pumps 10 reps towel squeezes 10 reps knee presses 10 reps heel slides  10 reps SLR's 10 reps ABD Followed by ICE     General Comments        Pertinent Vitals/Pain Pain Assessment: 0-10 Pain Score: 3  Pain Location: L knee Pain Descriptors / Indicators: Aching;Operative site guarding Pain Intervention(s): Monitored during session;Repositioned;Ice applied;Premedicated before session    Home Living                      Prior Function            PT Goals (current goals can now be found in the care plan section) Progress towards PT goals: Progressing toward goals    Frequency    7X/week      PT Plan Current plan remains appropriate    Co-evaluation              AM-PAC PT "6 Clicks"  Daily Activity  Outcome Measure  Difficulty turning over in bed (including adjusting bedclothes, sheets and blankets)?: A Little Difficulty moving from lying on back to sitting on the side of the bed? : A Little Difficulty sitting down on and standing up from a chair with arms (e.g., wheelchair, bedside commode, etc,.)?: A Little Help needed moving to and from a bed to chair (including a wheelchair)?: A Little Help needed walking in hospital room?: A Little Help needed climbing 3-5 steps with a railing? : A Lot 6 Click Score: 17    End of Session Equipment Utilized During Treatment: Gait belt;Left knee immobilizer Activity Tolerance: Patient tolerated treatment well Patient left: in chair;with call bell/phone within reach;with family/visitor present Nurse Communication: Mobility status PT Visit Diagnosis: Difficulty in walking, not elsewhere classified (R26.2)     Time: 3833-3832 PT Time  Calculation (min) (ACUTE ONLY): 23 min  Charges:  $Gait Training: 8-22 mins $Therapeutic Exercise: 8-22 mins                    G Codes:       Rica Koyanagi  PTA [facility]  Acute  Rehab Pager      [phone]

## 2016-12-18 NOTE — Progress Notes (Signed)
Physical Therapy Treatment Patient Details Name: Taylor Castaneda MRN: [id number] DOB: 1932-08-17 Today's Date: 12/18/2016    History of Present Illness Pt is an 83 year old female s/p L TKA    PT Comments    POD # 1 pm session Assisted OOB to amb to bathroom.  Assisted with toilet transfer and VC's safety negociating in tight spaces.  Amb an increased distance.  Assisted back to bed for CPM.  Reapplied ICE  Follow Up Recommendations  Outpatient PT;DC plan and follow up therapy as arranged by surgeon     Equipment Recommendations  Pt HAS all equipment   Recommendations for Other Services       Precautions / Restrictions Precautions Precautions: Fall;Knee Precaution Comments: instructed on KI use for amb and stairs Required Braces or Orthoses: Knee Immobilizer - Left Restrictions Weight Bearing Restrictions: No Other Position/Activity Restrictions: WBAT    Mobility  Bed Mobility Overal bed mobility: Needs Assistance Bed Mobility: Sit to Supine;Supine to Sit     Supine to sit: Min guard Sit to supine: Min guard   General bed mobility comments: assist L LE and increased time  Transfers Overall transfer level: Needs assistance Equipment used: Rolling walker (2 wheeled) Transfers: Sit to/from [employer] Sit to Stand: Supervision;Min guard Stand pivot transfers: Supervision;Min guard       General transfer comment: 25% vc for hand placement and increased time  Ambulation/Gait Ambulation/Gait assistance: Min guard;Min assist Ambulation Distance (Feet): 38 Feet Assistive device: Rolling walker (2 wheeled) Gait Pattern/deviations: Step-to pattern;Step-through pattern;Decreased stance time - left;Trunk flexed Gait velocity: decreased   General Gait Details: 25% VC's on proper walker to self distance and to increase stance time on LE to increase equal gait    Tolerated an increased distance.   Stairs            Wheelchair Mobility     Modified Rankin (Stroke Patients Only)       Balance                                            Cognition Arousal/Alertness: Awake/alert Behavior During Therapy: WFL for tasks assessed/performed Overall Cognitive Status: Within Functional Limits for tasks assessed                                        Exercises      General Comments        Pertinent Vitals/Pain Pain Assessment: 0-10 Pain Score: 3  Pain Location: L knee Pain Descriptors / Indicators: Aching;Operative site guarding Pain Intervention(s): Monitored during session;Repositioned;Ice applied;Premedicated before session    Home Living                      Prior Function            PT Goals (current goals can now be found in the care plan section) Progress towards PT goals: Progressing toward goals    Frequency    7X/week      PT Plan Current plan remains appropriate    Co-evaluation              AM-PAC PT "6 Clicks" Daily Activity  Outcome Measure  Difficulty turning over in bed (including adjusting bedclothes, sheets and blankets)?: A Little Difficulty moving  from lying on back to sitting on the side of the bed? : A Little Difficulty sitting down on and standing up from a chair with arms (e.g., wheelchair, bedside commode, etc,.)?: A Little Help needed moving to and from a bed to chair (including a wheelchair)?: A Little Help needed walking in hospital room?: A Little Help needed climbing 3-5 steps with a railing? : A Lot 6 Click Score: 17    End of Session Equipment Utilized During Treatment: Gait belt;Left knee immobilizer Activity Tolerance: Patient tolerated treatment well Patient left: in bed;with call bell/phone within reach;with family/visitor present;with bed alarm set Nurse Communication: Mobility status PT Visit Diagnosis: Difficulty in walking, not elsewhere classified (R26.2)     Time: 9798-9211 PT Time Calculation (min)  (ACUTE ONLY): 16 min  Charges:  $Gait Training: 8-22 mins                     G Codes:       Rica Koyanagi  PTA [facility]  Acute  Rehab Pager      [phone]

## 2016-12-18 NOTE — Progress Notes (Signed)
   Subjective: 1 Day Post-Op Procedure(s) (LRB): LEFT TOTAL KNEE ARTHROPLASTY (Left) Patient reports pain as mild.   We will start therapy today.  Plan is to go Home after hospital stay.  Objective: Vital signs in last 24 hours: Temp:  [96.1 F (35.6 C)-97.7 F (36.5 C)] 97.6 F (36.4 C) (07/17 0917) Pulse Rate:  [57-74] 65 (07/17 0917) Resp:  [7-25] 16 (07/17 0917) BP: (137-217)/(53-93) 152/57 (07/17 0917) SpO2:  [98 %-100 %] 100 % (07/17 0917)  Intake/Output from previous day:  Intake/Output Summary (Last 24 hours) at 12/18/16 1024 Last data filed at 12/18/16 0917  Gross per 24 hour  Intake          2883.75 ml  Output             1805 ml  Net          1078.75 ml    Intake/Output this shift: Total I/O In: 120 [P.O.:120] Out: 230 [Urine:200; Drains:30]  Labs:  Recent Labs  12/18/16 0601  HGB 10.7*    Recent Labs  12/18/16 0601  WBC 9.7  RBC 3.46*  HCT 31.0*  PLT 168    Recent Labs  12/18/16 0601  NA 135  K 4.8  CL 106  CO2 19*  BUN 31*  CREATININE 1.27*  GLUCOSE 263*  CALCIUM 8.4*   No results for input(s): LABPT, INR in the last 72 hours.  EXAM General - Patient is Alert, Appropriate and Oriented Extremity - Neurologically intact Neurovascular intact No cellulitis present Compartment soft Dressing - dressing C/D/I Motor Function - intact, moving foot and toes well on exam.  Hemovac pulled without difficulty.  Past Medical History:  Diagnosis Date  . Anemia   . Diabetes mellitus, type II ([place])   . Glaucoma    Dr.Hecker  . History of colon cancer 1998   Dr. Lennie Hummer  . Hyperlipemia   . Osteopenia 02/2012, 03/2014   DEXA hip -2.2  . Renal insufficiency   . Stenosing tenosynovitis of finger of left hand 2016   index - s/p steroid injection x2    Assessment/Plan: 1 Day Post-Op Procedure(s) (LRB): LEFT TOTAL KNEE ARTHROPLASTY (Left) Principal Problem:   OA (osteoarthritis) of knee  BP much better controlled today Advance  diet Up with therapy D/C IV fluids Plan for discharge tomorrow  DVT Prophylaxis - Xarelto Weight-Bearing as tolerated to left leg   Taylor Castaneda 12/18/2016, 10:24 AM

## 2016-12-19 LAB — BASIC METABOLIC PANEL
ANION GAP: 7 (ref 5–15)
BUN: 38 mg/dL — AB (ref 6–20)
CHLORIDE: 108 mmol/L (ref 101–111)
CO2: 23 mmol/L (ref 22–32)
CREATININE: 1.29 mg/dL — AB (ref 0.44–1.00)
Calcium: 8.6 mg/dL — ABNORMAL LOW (ref 8.9–10.3)
GFR calc Af Amer: 43 mL/min — ABNORMAL LOW (ref >60)
GFR calc non Af Amer: 37 mL/min — ABNORMAL LOW (ref >60)
Glucose, Bld: 139 mg/dL — ABNORMAL HIGH (ref 65–99)
Potassium: 4.4 mmol/L (ref 3.5–5.1)
SODIUM: 138 mmol/L (ref 135–145)

## 2016-12-19 LAB — CBC
HCT: 30.9 % — ABNORMAL LOW (ref 36.0–46.0)
Hemoglobin: 10.3 g/dL — ABNORMAL LOW (ref 12.0–15.0)
MCH: 30 pg (ref 26.0–34.0)
MCHC: 33.3 g/dL (ref 30.0–36.0)
MCV: 90.1 fL (ref 78.0–100.0)
PLATELETS: 182 10*3/uL (ref 150–400)
RBC: 3.43 MIL/uL — AB (ref 3.87–5.11)
RDW: 13.2 % (ref 11.5–15.5)
WBC: 17 10*3/uL — AB (ref 4.0–10.5)

## 2016-12-19 LAB — GLUCOSE, CAPILLARY
Glucose-Capillary: 142 mg/dL — ABNORMAL HIGH (ref 65–99)
Glucose-Capillary: 95 mg/dL (ref 65–99)

## 2016-12-19 MED ORDER — OXYCODONE HCL 5 MG PO TABS
5.0000 mg | ORAL_TABLET | ORAL | Status: DC | PRN
  Administered 2016-12-19 (×2): 15 mg via ORAL
  Filled 2016-12-19 (×2): qty 3

## 2016-12-19 MED ORDER — OXYCODONE HCL 5 MG PO TABS: 5.0000 mg | ORAL_TABLET | ORAL | 0 refills | Status: DC | PRN

## 2016-12-19 NOTE — Progress Notes (Signed)
Physical Therapy Treatment Patient Details Name: Taylor Castaneda MRN: [id number] DOB: 1932/08/09 Today's Date: 12/19/2016    History of Present Illness Pt is an 83 year old female s/p L TKA    PT Comments    POD  # 2 Assisted with amb a greater distance in hallway.  Performed all supine TKR TE's following HEP handout.  Instructed on proper tech, freq as well as use of ICE.  OP starts Friday.  Pt instructed to perform HEP until then.  All mobility questions addressed.  Pt ready for D/C to home.    Follow Up Recommendations  Outpatient PT     Equipment Recommendations       Recommendations for Other Services       Precautions / Restrictions Precautions Precautions: Fall Precaution Booklet Issued: No Precaution Comments: Indep with SLR so did not use KI Required Braces or Orthoses: Knee Immobilizer - Left Restrictions Weight Bearing Restrictions: No LLE Weight Bearing: Weight bearing as tolerated    Mobility  Bed Mobility Overal bed mobility: Modified Independent Bed Mobility: Supine to Sit     Supine to sit: Modified independent (Device/Increase time)     General bed mobility comments: OOB in recliner  Transfers Overall transfer level: Needs assistance Equipment used: Rolling walker (2 wheeled) Transfers: Sit to/from Stand Sit to Stand: Supervision         General transfer comment: Supervision for safety. Cues for hand placement with RW  Ambulation/Gait Ambulation/Gait assistance: Supervision;Min guard Ambulation Distance (Feet): 55 Feet Assistive device: Rolling walker (2 wheeled) Gait Pattern/deviations: Step-to pattern;Step-through pattern;Decreased stance time - left;Trunk flexed Gait velocity: decreased   General Gait Details: <25% VC's on proper walker to self distance and to increase stance time on LE to increase equal gait    Tolerated an increased distance.   Stairs Stairs:  (no stairs at home)          Wheelchair Mobility    Modified  Rankin (Stroke Patients Only)       Balance Overall balance assessment: Needs assistance Sitting-balance support: Feet supported;No upper extremity supported Sitting balance-Leahy Scale: Good     Standing balance support: No upper extremity supported;During functional activity Standing balance-Leahy Scale: Fair Standing balance comment: Able to stand at sink and wash hands without UE support                            Cognition Arousal/Alertness: Awake/alert Behavior During Therapy: WFL for tasks assessed/performed Overall Cognitive Status: Within Functional Limits for tasks assessed                                        Exercises   Total Knee Replacement TE's 10 reps B LE ankle pumps 10 reps towel squeezes 10 reps knee presses 10 reps heel slides  10 reps SAQ's 10 reps SLR's 10 reps ABD Followed by ICE     General Comments        Pertinent Vitals/Pain Pain Assessment: 0-10 Pain Score: 6  Pain Location: L knee "more today" Pain Descriptors / Indicators: Aching;Sore;Operative site guarding Pain Intervention(s): Monitored during session;Repositioned;Ice applied    Home Living                      Prior Function            PT Goals (current goals  can now be found in the care plan section) Acute Rehab PT Goals Patient Stated Goal: go home today Progress towards PT goals: Progressing toward goals    Frequency           PT Plan Current plan remains appropriate    Co-evaluation              AM-PAC PT "6 Clicks" Daily Activity  Outcome Measure  Difficulty turning over in bed (including adjusting bedclothes, sheets and blankets)?: A Little Difficulty moving from lying on back to sitting on the side of the bed? : A Little Difficulty sitting down on and standing up from a chair with arms (e.g., wheelchair, bedside commode, etc,.)?: A Little Help needed moving to and from a bed to chair (including a wheelchair)?:  A Little Help needed walking in hospital room?: A Little Help needed climbing 3-5 steps with a railing? : A Little 6 Click Score: 18    End of Session Equipment Utilized During Treatment: Gait belt Activity Tolerance: Patient tolerated treatment well Patient left: in chair;with call bell/phone within reach;with family/visitor present Nurse Communication:  (pt ready for D/C to home one session PT) PT Visit Diagnosis: Difficulty in walking, not elsewhere classified (R26.2)     Time: 1975-8832 PT Time Calculation (min) (ACUTE ONLY): 31 min  Charges:  $Gait Training: 8-22 mins $Therapeutic Exercise: 8-22 mins                    G Codes:       {Shonteria Abeln  PTA [facility]  Acute  Rehab Pager      [phone]

## 2016-12-19 NOTE — Care Management Note (Signed)
Case Management Note  Patient Details  Name: Taylor Castaneda MRN: [id number] Date of Birth: [birth year]  Subjective/Objective:  83 y/o f admitted w/OA knee.                  Action/Plan:d/c home-otpt PT.   Expected Discharge Date:  12/19/16               Expected Discharge Plan:  Home/Self Care  In-House Referral:     Discharge planning Services  CM Consult  Post Acute Care Choice:    Choice offered to:     DME Arranged:    DME Agency:     HH Arranged:    HH Agency:     Status of Service:  Completed, signed off  If discussed at [employer] of Stay Meetings, dates discussed:    Additional Comments:  Dessa Phi, RN 12/19/2016, 1:08 PM

## 2016-12-19 NOTE — Progress Notes (Signed)
   Subjective: 2 Days Post-Op Procedure(s) (LRB): LEFT TOTAL KNEE ARTHROPLASTY (Left) Patient reports pain as moderate.   Patient seen in rounds for Dr. Wynelle Link. Patient is well, and has had no acute complaints or problems other than pain in the left knee. No SOB or chest pain. Voiding well. Positive flatus.    Objective: Vital signs in last 24 hours: Temp:  [97.7 F (36.5 C)-98.4 F (36.9 C)] 98.4 F (36.9 C) (07/18 0648) Pulse Rate:  [65-74] 74 (07/18 0806) Resp:  [16-18] 18 (07/18 0648) BP: (151-165)/(57-68) 155/57 (07/18 0806) SpO2:  [100 %] 100 % (07/18 0648)  Intake/Output from previous day:  Intake/Output Summary (Last 24 hours) at 12/19/16 1056 Last data filed at 12/19/16 0953  Gross per 24 hour  Intake             1080 ml  Output             2200 ml  Net            -1120 ml    Intake/Output this shift: Total I/O In: 240 [P.O.:240] Out: 0   Labs:  Recent Labs  12/18/16 0601 12/19/16 0541  HGB 10.7* 10.3*    Recent Labs  12/18/16 0601 12/19/16 0541  WBC 9.7 17.0*  RBC 3.46* 3.43*  HCT 31.0* 30.9*  PLT 168 182    Recent Labs  12/18/16 0601 12/19/16 0541  NA 135 138  K 4.8 4.4  CL 106 108  CO2 19* 23  BUN 31* 38*  CREATININE 1.27* 1.29*  GLUCOSE 263* 139*  CALCIUM 8.4* 8.6*    EXAM General - Patient is Alert and Oriented Extremity - Neurologically intact Intact pulses distally Dorsiflexion/Plantar flexion intact No cellulitis present Compartment soft Dressing/Incision - clean, dry, no drainage Motor Function - intact, moving foot and toes well on exam.   Past Medical History:  Diagnosis Date  . Anemia   . Diabetes mellitus, type II ([place])   . Glaucoma    Dr.Hecker  . History of colon cancer 1998   Dr. Lennie Hummer  . Hyperlipemia   . Osteopenia 02/2012, 03/2014   DEXA hip -2.2  . Renal insufficiency   . Stenosing tenosynovitis of finger of left hand 2016   index - s/p steroid injection x2    Assessment/Plan: 2 Days Post-Op  Procedure(s) (LRB): LEFT TOTAL KNEE ARTHROPLASTY (Left) Principal Problem:   OA (osteoarthritis) of knee  Estimated body mass index is 25 kg/m as calculated from the following:   Height as of this encounter: 5' (1.524 m).   Weight as of this encounter: 58.1 kg (128 lb). Advance diet Up with therapy  DVT Prophylaxis - Xarelto Weight-Bearing as tolerated   Continue therapy today. Will adjust pain meds slightly. Plan for DC home today with outpatient therapy starting Friday.    Ardeen Jourdain PA-C Orthopaedic Surgery 12/19/2016, 10:56 AM

## 2016-12-19 NOTE — Progress Notes (Signed)
Occupational Therapy Treatment Patient Details Name: Taylor Castaneda MRN: [id number] DOB: Jul 22, 1932 Today's Date: 12/19/2016    History of present illness Pt is an 83 year old female s/p L TKA   OT comments  Pt able to perform toilet transfer, grooming task standing at the sink, and walk in shower transfer with supervision. Educated on proper technique for shower transfer and LB ADL. D/c plan remains appropriate. Will continue to follow acutely.   Follow Up Recommendations  No OT follow up;Supervision/Assistance - 24 hour (initially)    Equipment Recommendations  Other (comment) (youth RW)    Recommendations for Other Services      Precautions / Restrictions Precautions Precautions: Fall;Knee Precaution Booklet Issued: No Required Braces or Orthoses: Knee Immobilizer - Left Restrictions Weight Bearing Restrictions: Yes LLE Weight Bearing: Weight bearing as tolerated       Mobility Bed Mobility Overal bed mobility: Modified Independent Bed Mobility: Supine to Sit     Supine to sit: Modified independent (Device/Increase time)     General bed mobility comments: HOB elevated. No physical assist needed  Transfers Overall transfer level: Needs assistance Equipment used: Rolling walker (2 wheeled) Transfers: Sit to/from Stand Sit to Stand: Supervision         General transfer comment: Supervision for safety. Cues for hand placement with RW    Balance Overall balance assessment: Needs assistance Sitting-balance support: Feet supported;No upper extremity supported Sitting balance-Leahy Scale: Good     Standing balance support: No upper extremity supported;During functional activity Standing balance-Leahy Scale: Fair Standing balance comment: Able to stand at sink and wash hands without UE support                           ADL either performed or assessed with clinical judgement   ADL Overall ADL's : Needs assistance/impaired     Grooming:  Supervision/safety;Standing;Wash/dry hands                 Lower Body Dressing Details (indicate cue type and reason): Educated on LB dressing technique; L leg into clothing first. Son to assist as needed upon return home Toilet Transfer: Insurance underwriter- Water quality scientist and Hygiene: Supervision/safety;Sit to/from stand   Tub/ Shower Transfer: Supervision/safety;Walk-in shower;Ambulation;Shower Technical sales engineer Details (indicate cue type and reason): Educated on walk in shower transfer technique; pt able to return demo with supervision. Discussed use of shower chair for safety and son home when attemtping to shower first few times Functional mobility during ADLs: Supervision/safety;Rolling walker       Vision       Perception     Praxis      Cognition Arousal/Alertness: Awake/alert Behavior During Therapy: WFL for tasks assessed/performed Overall Cognitive Status: Within Functional Limits for tasks assessed                                          Exercises     Shoulder Instructions       General Comments      Pertinent Vitals/ Pain       Pain Assessment: 0-10 Pain Score: 5  Pain Location: L knee Pain Descriptors / Indicators: Aching;Sore Pain Intervention(s): Monitored during session;Repositioned;Ice applied  Home Living  Prior Functioning/Environment              Frequency  Min 2X/week        Progress Toward Goals  OT Goals(current goals can now be found in the care plan section)  Progress towards OT goals: Progressing toward goals  Acute Rehab OT Goals Patient Stated Goal: go home today OT Goal Formulation: With patient/family  Plan Discharge plan remains appropriate    Co-evaluation                 AM-PAC PT "6 Clicks" Daily Activity     Outcome Measure   Help from another person  eating meals?: None Help from another person taking care of personal grooming?: A Little Help from another person toileting, which includes using toliet, bedpan, or urinal?: A Little Help from another person bathing (including washing, rinsing, drying)?: A Little Help from another person to put on and taking off regular upper body clothing?: None Help from another person to put on and taking off regular lower body clothing?: A Little 6 Click Score: 20    End of Session Equipment Utilized During Treatment: Rolling walker CPM Left Knee CPM Left Knee: Off  OT Visit Diagnosis: Other abnormalities of gait and mobility (R26.89);Pain Pain - Right/Left: Left Pain - part of body: Knee   Activity Tolerance Patient tolerated treatment well   Patient Left in chair;with call bell/phone within reach;with family/visitor present   Nurse Communication          Time: 1610-9604 OT Time Calculation (min): 13 min  Charges: OT General Charges $OT Visit: 1 Procedure OT Treatments $Self Care/Home Management : 8-22 mins  Vitor Overbaugh A. Ulice Brilliant, M.S., OTR/L Pager: [place] 12/19/2016, 9:06 AM

## 2016-12-20 NOTE — Discharge Summary (Signed)
Physician Discharge Summary   Patient ID: Taylor Castaneda MRN: [id number] DOB/AGE: 81-Sep-1934 81 y.o.  Admit date: 12/17/2016 Discharge date: 12/19/2016  Primary Diagnosis: Primary osteoarthritis left knee  Admission Diagnoses:  Past Medical History:  Diagnosis Date  . Anemia   . Diabetes mellitus, type II ([place])   . Glaucoma    Dr.Hecker  . History of colon cancer 1998   Dr. Lennie Hummer  . Hyperlipemia   . Osteopenia 02/2012, 03/2014   DEXA hip -2.2  . Renal insufficiency   . Stenosing tenosynovitis of finger of left hand 2016   index - s/p steroid injection x2   Discharge Diagnoses:   Principal Problem:   OA (osteoarthritis) of knee  Estimated body mass index is 25 kg/m as calculated from the following:   Height as of this encounter: 5' (1.524 m).   Weight as of this encounter: 58.1 kg (128 lb).  Procedure:  Procedure(s) (LRB): LEFT TOTAL KNEE ARTHROPLASTY (Left)   Consults: None  HPI: The patient is a 83 year old female who presented for follow up of their knee. The patient is being followed for their left knee pain and osteoarthritis. They are now over a year out from [place] series. Symptoms reported include: pain, swelling, aching, stiffness, grinding, giving way, instability and difficulty ambulating. The patient feels that they are doing poorly and report their pain level to be moderate to severe. The following medication has been used for pain control: Tylenol. The patient has not gotten any relief of their symptoms with viscosupplementation (she states the injections may have helped for about three weeks. She did not have cortisone prior to visco, since she is a diabetic.). Note for "Follow-up Knee": She states that she was doing water aerobics until she had a fall in February. She was evaluated by her pcp for a foot injury as well as knee sprain after the fall. She states that she tried to get back into the water exercises, but felt like she had more pain in her knee  afterward. Unfortunately, her left knee is getting progressively worse with time. The injections have provided minimal benefit. Even though it has been a year since she had Euflexxa, she said it did not last long. She has gotten worse since she twisted the knee in February. She has not had swelling, but the knee is preventing her from doing things she desires. She is ready to get a more permanent fix for the knee. She is ready to proceed with surgery. They have been treated conservatively in the past for the above stated problem and despite conservative measures, they continue to have progressive pain and severe functional limitations and dysfunction. They have failed non-operative management including home exercise, medications, and injections. It is felt that they would benefit from undergoing total joint replacement. Risks and benefits of the procedure have been discussed with the patient and they elect to proceed with surgery. There are no active contraindications to surgery such as ongoing infection or rapidly progressive neurological disease.  Laboratory Data: Admission on 12/17/2016, Discharged on 12/19/2016  Component Date Value Ref Range Status  . Glucose-Capillary 12/17/2016 153* 65 - 99 mg/dL Final  . Glucose-Capillary 12/17/2016 148* 65 - 99 mg/dL Final  . WBC 12/18/2016 9.7  4.0 - 10.5 K/uL Final  . RBC 12/18/2016 3.46* 3.87 - 5.11 MIL/uL Final  . Hemoglobin 12/18/2016 10.7* 12.0 - 15.0 g/dL Final  . HCT 12/18/2016 31.0* 36.0 - 46.0 % Final  . MCV 12/18/2016 89.6  78.0 -  100.0 fL Final  . MCH 12/18/2016 30.9  26.0 - 34.0 pg Final  . MCHC 12/18/2016 34.5  30.0 - 36.0 g/dL Final  . RDW 12/18/2016 12.8  11.5 - 15.5 % Final  . Platelets 12/18/2016 168  150 - 400 K/uL Final  . Sodium 12/18/2016 135  135 - 145 mmol/L Final  . Potassium 12/18/2016 4.8  3.5 - 5.1 mmol/L Final  . Chloride 12/18/2016 106  101 - 111 mmol/L Final  . CO2 12/18/2016 19* 22 - 32 mmol/L Final  . Glucose, Bld  12/18/2016 263* 65 - 99 mg/dL Final  . BUN 12/18/2016 31* 6 - 20 mg/dL Final  . Creatinine, Ser 12/18/2016 1.27* 0.44 - 1.00 mg/dL Final  . Calcium 12/18/2016 8.4* 8.9 - 10.3 mg/dL Final  . GFR calc non Af Amer 12/18/2016 38* >60 mL/min Final  . GFR calc Af Amer 12/18/2016 44* >60 mL/min Final   Comment: (NOTE) The eGFR has been calculated using the CKD EPI equation. This calculation has not been validated in all clinical situations. eGFR's persistently <60 mL/min signify possible Chronic Kidney Disease.   . Anion gap 12/18/2016 10  5 - 15 Final  . Glucose-Capillary 12/17/2016 191* 65 - 99 mg/dL Final  . Glucose-Capillary 12/17/2016 252* 65 - 99 mg/dL Final  . Glucose-Capillary 12/18/2016 232* 65 - 99 mg/dL Final  . Glucose-Capillary 12/18/2016 244* 65 - 99 mg/dL Final  . WBC 12/19/2016 17.0* 4.0 - 10.5 K/uL Final  . RBC 12/19/2016 3.43* 3.87 - 5.11 MIL/uL Final  . Hemoglobin 12/19/2016 10.3* 12.0 - 15.0 g/dL Final  . HCT 12/19/2016 30.9* 36.0 - 46.0 % Final  . MCV 12/19/2016 90.1  78.0 - 100.0 fL Final  . MCH 12/19/2016 30.0  26.0 - 34.0 pg Final  . MCHC 12/19/2016 33.3  30.0 - 36.0 g/dL Final  . RDW 12/19/2016 13.2  11.5 - 15.5 % Final  . Platelets 12/19/2016 182  150 - 400 K/uL Final  . Sodium 12/19/2016 138  135 - 145 mmol/L Final  . Potassium 12/19/2016 4.4  3.5 - 5.1 mmol/L Final  . Chloride 12/19/2016 108  101 - 111 mmol/L Final  . CO2 12/19/2016 23  22 - 32 mmol/L Final  . Glucose, Bld 12/19/2016 139* 65 - 99 mg/dL Final  . BUN 12/19/2016 38* 6 - 20 mg/dL Final  . Creatinine, Ser 12/19/2016 1.29* 0.44 - 1.00 mg/dL Final  . Calcium 12/19/2016 8.6* 8.9 - 10.3 mg/dL Final  . GFR calc non Af Amer 12/19/2016 37* >60 mL/min Final  . GFR calc Af Amer 12/19/2016 43* >60 mL/min Final   Comment: (NOTE) The eGFR has been calculated using the CKD EPI equation. This calculation has not been validated in all clinical situations. eGFR's persistently <60 mL/min signify possible Chronic  Kidney Disease.   . Anion gap 12/19/2016 7  5 - 15 Final  . Glucose-Capillary 12/18/2016 194* 65 - 99 mg/dL Final  . Glucose-Capillary 12/18/2016 147* 65 - 99 mg/dL Final  . Glucose-Capillary 12/19/2016 142* 65 - 99 mg/dL Final  . Glucose-Capillary 12/19/2016 95  65 - 99 mg/dL Final  Hospital Outpatient Visit on 12/11/2016  Component Date Value Ref Range Status  . aPTT 12/11/2016 39* 24 - 36 seconds Final   Comment:        IF BASELINE aPTT IS ELEVATED, SUGGEST PATIENT RISK ASSESSMENT BE USED TO DETERMINE APPROPRIATE ANTICOAGULANT THERAPY.   . Sodium 12/11/2016 141  135 - 145 mmol/L Final  . Potassium 12/11/2016 4.7  3.5 -  5.1 mmol/L Final  . Chloride 12/11/2016 108  101 - 111 mmol/L Final  . CO2 12/11/2016 24  22 - 32 mmol/L Final  . Glucose, Bld 12/11/2016 126* 65 - 99 mg/dL Final  . BUN 12/11/2016 38* 6 - 20 mg/dL Final  . Creatinine, Ser 12/11/2016 1.09* 0.44 - 1.00 mg/dL Final  . Calcium 12/11/2016 9.8  8.9 - 10.3 mg/dL Final  . Total Protein 12/11/2016 7.3  6.5 - 8.1 g/dL Final  . Albumin 12/11/2016 4.3  3.5 - 5.0 g/dL Final  . AST 12/11/2016 21  15 - 41 U/L Final  . ALT 12/11/2016 23  14 - 54 U/L Final  . Alkaline Phosphatase 12/11/2016 97  38 - 126 U/L Final  . Total Bilirubin 12/11/2016 0.8  0.3 - 1.2 mg/dL Final  . GFR calc non Af Amer 12/11/2016 46* >60 mL/min Final  . GFR calc Af Amer 12/11/2016 53* >60 mL/min Final   Comment: (NOTE) The eGFR has been calculated using the CKD EPI equation. This calculation has not been validated in all clinical situations. eGFR's persistently <60 mL/min signify possible Chronic Kidney Disease.   . Anion gap 12/11/2016 9  5 - 15 Final  . Prothrombin Time 12/11/2016 13.9  11.4 - 15.2 seconds Final  . INR 12/11/2016 1.07   Final  . ABO/RH(D) 12/11/2016 O POS   Final  . Antibody Screen 12/11/2016 NEG   Final  . Sample Expiration 12/11/2016 12/20/2016   Final  . Extend sample reason 12/11/2016 NO TRANSFUSIONS OR PREGNANCY IN THE  PAST 3 MONTHS   Final  . MRSA, PCR 12/11/2016 NEGATIVE  NEGATIVE Final  . Staphylococcus aureus 12/11/2016 NEGATIVE  NEGATIVE Final   Comment:        The Xpert SA Assay (FDA approved for NASAL specimens in patients over 73 years of age), is one component of a comprehensive surveillance program.  Test performance has been validated by [facility] for patients greater than or equal to 47 year old. It is not intended to diagnose infection nor to guide or monitor treatment.   . Hgb A1c MFr Bld 12/11/2016 7.1* 4.8 - 5.6 % Final   Comment: (NOTE)         Pre-diabetes: 5.7 - 6.4         Diabetes: >6.4         Glycemic control for adults with diabetes: <7.0   . Mean Plasma Glucose 12/11/2016 157  mg/dL Final   Comment: (NOTE) Performed At: [facility] [place], [place] [id number] Lindon Romp MD MG:[id number]   . Glucose-Capillary 12/11/2016 124* 65 - 99 mg/dL Final  . ABO/RH(D) 12/11/2016 O POS   Final  Office Visit on 11/20/2016  Component Date Value Ref Range Status  . Sodium 11/20/2016 141  135 - 145 mEq/L Final  . Potassium 11/20/2016 4.8  3.5 - 5.1 mEq/L Final  . Chloride 11/20/2016 106  96 - 112 mEq/L Final  . CO2 11/20/2016 25  19 - 32 mEq/L Final  . Glucose, Bld 11/20/2016 178* 70 - 99 mg/dL Final  . BUN 11/20/2016 30* 6 - 23 mg/dL Final  . Creatinine, Ser 11/20/2016 1.26* 0.40 - 1.20 mg/dL Final  . Total Bilirubin 11/20/2016 0.5  0.2 - 1.2 mg/dL Final  . Alkaline Phosphatase 11/20/2016 101  39 - 117 U/L Final  . AST 11/20/2016 17  0 - 37 U/L Final  . ALT 11/20/2016 14  0 - 35 U/L Final  . Total Protein 11/20/2016  7.0  6.0 - 8.3 g/dL Final  . Albumin 11/20/2016 4.2  3.5 - 5.2 g/dL Final  . Calcium 11/20/2016 10.0  8.4 - 10.5 mg/dL Final  . GFR 11/20/2016 43.17* >60.00 mL/min Final  . Hgb A1c MFr Bld 11/20/2016 8.1* 4.6 - 6.5 % Final   Glycemic Control Guidelines for People with Diabetes:Non Diabetic:  <6%Goal of Therapy: <7%Additional Action  Suggested:  >8%   . WBC 11/20/2016 9.0  4.0 - 10.5 K/uL Final  . RBC 11/20/2016 3.96  3.87 - 5.11 Mil/uL Final  . Hemoglobin 11/20/2016 12.5  12.0 - 15.0 g/dL Final  . HCT 11/20/2016 36.9  36.0 - 46.0 % Final  . MCV 11/20/2016 93.2  78.0 - 100.0 fl Final  . MCHC 11/20/2016 33.8  30.0 - 36.0 g/dL Final  . RDW 11/20/2016 13.1  11.5 - 15.5 % Final  . Platelets 11/20/2016 186.0  150.0 - 400.0 K/uL Final  . Neutrophils Relative % 11/20/2016 60.0  43.0 - 77.0 % Final  . Lymphocytes Relative 11/20/2016 27.2  12.0 - 46.0 % Final  . Monocytes Relative 11/20/2016 9.2  3.0 - 12.0 % Final  . Eosinophils Relative 11/20/2016 2.7  0.0 - 5.0 % Final  . Basophils Relative 11/20/2016 0.9  0.0 - 3.0 % Final  . Neutro Abs 11/20/2016 5.4  1.4 - 7.7 K/uL Final  . Lymphs Abs 11/20/2016 2.4  0.7 - 4.0 K/uL Final  . Monocytes Absolute 11/20/2016 0.8  0.1 - 1.0 K/uL Final  . Eosinophils Absolute 11/20/2016 0.2  0.0 - 0.7 K/uL Final  . Basophils Absolute 11/20/2016 0.1  0.0 - 0.1 K/uL Final  . INR 11/20/2016 1.1* 0.8 - 1.0 ratio Final  . Prothrombin Time 11/20/2016 11.9  9.6 - 13.1 sec Final  . Sed Rate 11/20/2016 32* 0 - 30 mm/hr Final  . Direct LDL 11/20/2016 92.0  mg/dL Final   Optimal:  <100 mg/dLNear or Above Optimal:  100-129 mg/dLBorderline High:  130-159 mg/dLHigh:  160-189 mg/dLVery High:  >190 mg/dL      Hospital Course: Taylor Castaneda is a 83 y.o. who was admitted to [facility]. They were brought to the operating room on 12/17/2016 and underwent Procedure(s): LEFT TOTAL KNEE ARTHROPLASTY.  Patient tolerated the procedure well and was later transferred to the recovery room and then to the orthopaedic floor for postoperative care.  They were given PO and IV analgesics for pain control following their surgery.  They were given 24 hours of postoperative antibiotics of  Anti-infectives    Start     Dose/Rate Route Frequency Ordered Stop   12/17/16 1800  ceFAZolin (ANCEF) IVPB 1 g/50 mL premix      1 g 100 mL/hr over 30 Minutes Intravenous Every 6 hours 12/17/16 1352 12/18/16 0201   12/17/16 0921  ceFAZolin (ANCEF) 2-4 GM/100ML-% IVPB    Comments:  Harvell, Gwendolyn  : cabinet override      12/17/16 0921 12/17/16 1119   12/17/16 0916  ceFAZolin (ANCEF) IVPB 2g/100 mL premix     2 g 200 mL/hr over 30 Minutes Intravenous On call to O.R. 12/17/16 0916 12/17/16 1149   12/17/16 0916  ceFAZolin (ANCEF) IVPB 2g/100 mL premix  Status:  Discontinued     2 g 200 mL/hr over 30 Minutes Intravenous On call to O.R. 12/17/16 [id number] 12/17/16 0919     and started on DVT prophylaxis in the form of Xarelto.   PT and OT were ordered for total joint protocol.  Discharge planning  consulted to help with postop disposition and equipment needs.  Patient had a fair night on the evening of surgery.  They started to get up OOB with therapy on day one. Hemovac drain was pulled without difficulty.  Continued to work with therapy into day two. She did have an increase in pain after therapy but responded with an adjustment in pain medication. Dressing was changed on day two and the incision was clean and dry. The patient had progressed with therapy and meeting their goals.  Incision was healing well.  Patient was seen in rounds and was ready to go home.   Diet: Cardiac diet Activity:WBAT Follow-up:in 2 weeks Disposition - Home Discharged Condition: stable   Discharge Instructions    Call MD / Call 911    Complete by:  As directed    If you experience chest pain or shortness of breath, CALL 911 and be transported to the hospital emergency room.  If you develope a fever above 101 F, pus (white drainage) or increased drainage or redness at the wound, or calf pain, call your surgeon's office.   Constipation Prevention    Complete by:  As directed    Drink plenty of fluids.  Prune juice may be helpful.  You may use a stool softener, such as Colace (over the counter) 100 mg twice a day.  Use MiraLax (over the counter)  for constipation as needed.   Diet - low sodium heart healthy    Complete by:  As directed    Diet Carb Modified    Complete by:  As directed    Discharge instructions    Complete by:  As directed    Dr. Gaynelle Arabian Total Joint Specialist [facility] [address] [place], [place] [address] [phone]  TOTAL KNEE REPLACEMENT POSTOPERATIVE DIRECTIONS  Knee Rehabilitation, Guidelines Following Surgery  Results after knee surgery are often greatly improved when you follow the exercise, range of motion and muscle strengthening exercises prescribed by your doctor. Safety measures are also important to protect the knee from further injury. Any time any of these exercises cause you to have increased pain or swelling in your knee joint, decrease the amount until you are comfortable again and slowly increase them. If you have problems or questions, call your caregiver or physical therapist for advice.   HOME CARE INSTRUCTIONS  Remove items at home which could result in a fall. This includes throw rugs or furniture in walking pathways.  ICE to the affected knee every three hours for 30 minutes at a time and then as needed for pain and swelling.  Continue to use ice on the knee for pain and swelling from surgery. You may notice swelling that will progress down to the foot and ankle.  This is normal after surgery.  Elevate the leg when you are not up walking on it.   Continue to use the breathing machine which will help keep your temperature down.  It is common for your temperature to cycle up and down following surgery, especially at night when you are not up moving around and exerting yourself.  The breathing machine keeps your lungs expanded and your temperature down. Do not place pillow under knee, focus on keeping the knee straight while resting  DIET You may resume your previous home diet once your are discharged from the hospital.  DRESSING / WOUND CARE /  SHOWERING You may start showering once you are discharged home but do not submerge the incision under  water. Just pat the incision dry and apply a dry gauze dressing on daily. Change the surgical dressing daily and reapply a dry dressing each time.  ACTIVITY Walk with your walker as instructed. Use walker as long as suggested by your caregivers. Avoid periods of inactivity such as sitting longer than an hour when not asleep. This helps prevent blood clots.  You may resume a sexual relationship in one month or when given the OK by your doctor.  You may return to work once you are cleared by your doctor.  Do not drive a car for 6 weeks or until released by you surgeon.  Do not drive while taking narcotics.  WEIGHT BEARING Weight bearing as tolerated with assist device (walker, cane, etc) as directed, use it as long as suggested by your surgeon or therapist, typically at least 4-6 weeks.  POSTOPERATIVE CONSTIPATION PROTOCOL Constipation - defined medically as fewer than three stools per week and severe constipation as less than one stool per week.  One of the most common issues patients have following surgery is constipation.  Even if you have a regular bowel pattern at home, your normal regimen is likely to be disrupted due to multiple reasons following surgery.  Combination of anesthesia, postoperative narcotics, change in appetite and fluid intake all can affect your bowels.  In order to avoid complications following surgery, here are some recommendations in order to help you during your recovery period.  Colace (docusate) - Pick up an over-the-counter form of Colace or another stool softener and take twice a day as long as you are requiring postoperative pain medications.  Take with a full glass of water daily.  If you experience loose stools or diarrhea, hold the colace until you stool forms back up.  If your symptoms do not get better within 1 week or if they get worse, check with your  doctor.  Dulcolax (bisacodyl) - Pick up over-the-counter and take as directed by the product packaging as needed to assist with the movement of your bowels.  Take with a full glass of water.  Use this product as needed if not relieved by Colace only.   MiraLax (polyethylene glycol) - Pick up over-the-counter to have on hand.  MiraLax is a solution that will increase the amount of water in your bowels to assist with bowel movements.  Take as directed and can mix with a glass of water, juice, soda, coffee, or tea.  Take if you go more than two days without a movement. Do not use MiraLax more than once per day. Call your doctor if you are still constipated or irregular after using this medication for 7 days in a row.  If you continue to have problems with postoperative constipation, please contact the office for further assistance and recommendations.  If you experience "the worst abdominal pain ever" or develop nausea or vomiting, please contact the office immediatly for further recommendations for treatment.  ITCHING  If you experience itching with your medications, try taking only a single pain pill, or even half a pain pill at a time.  You can also use Benadryl over the counter for itching or also to help with sleep.   TED HOSE STOCKINGS Wear the elastic stockings on both legs for three weeks following surgery during the day but you may remove then at night for sleeping.  MEDICATIONS See your medication summary on the "After Visit Summary" that the nursing staff will review with you prior to discharge.  You may have  some home medications which will be placed on hold until you complete the course of blood thinner medication.  It is important for you to complete the blood thinner medication as prescribed by your surgeon.  Continue your approved medications as instructed at time of discharge.  PRECAUTIONS If you experience chest pain or shortness of breath - call 911 immediately for transfer to the  hospital emergency department.  If you develop a fever greater that 101 F, purulent drainage from wound, increased redness or drainage from wound, foul odor from the wound/dressing, or calf pain - CONTACT YOUR SURGEON.                                                   FOLLOW-UP APPOINTMENTS Make sure you keep all of your appointments after your operation with your surgeon and caregivers. You should call the office at the above phone number and make an appointment for approximately two weeks after the date of your surgery or on the date instructed by your surgeon outlined in the "After Visit Summary".   RANGE OF MOTION AND STRENGTHENING EXERCISES  Rehabilitation of the knee is important following a knee injury or an operation. After just a few days of immobilization, the muscles of the thigh which control the knee become weakened and shrink (atrophy). Knee exercises are designed to build up the tone and strength of the thigh muscles and to improve knee motion. Often times heat used for twenty to thirty minutes before working out will loosen up your tissues and help with improving the range of motion but do not use heat for the first two weeks following surgery. These exercises can be done on a training (exercise) mat, on the floor, on a table or on a bed. Use what ever works the best and is most comfortable for you Knee exercises include:  Leg Lifts - While your knee is still immobilized in a splint or cast, you can do straight leg raises. Lift the leg to 60 degrees, hold for 3 sec, and slowly lower the leg. Repeat 10-20 times 2-3 times daily. Perform this exercise against resistance later as your knee gets better.  Quad and Hamstring Sets - Tighten up the muscle on the front of the thigh (Quad) and hold for 5-10 sec. Repeat this 10-20 times hourly. Hamstring sets are done by pushing the foot backward against an object and holding for 5-10 sec. Repeat as with quad sets.  Leg Slides: Lying on your back,  slowly slide your foot toward your buttocks, bending your knee up off the floor (only go as far as is comfortable). Then slowly slide your foot back down until your leg is flat on the floor again. Angel Wings: Lying on your back spread your legs to the side as far apart as you can without causing discomfort.  A rehabilitation program following serious knee injuries can speed recovery and prevent re-injury in the future due to weakened muscles. Contact your doctor or a physical therapist for more information on knee rehabilitation.   IF YOU ARE TRANSFERRED TO A SKILLED REHAB FACILITY If the patient is transferred to a skilled rehab facility following release from the hospital, a list of the current medications will be sent to the facility for the patient to continue.  When discharged from the skilled rehab facility, please have the facility set  up the patient's [place] prior to being released. Also, the skilled facility will be responsible for providing the patient with their medications at time of release from the facility to include their pain medication, the muscle relaxants, and their blood thinner medication. If the patient is still at the rehab facility at time of the two week follow up appointment, the skilled rehab facility will also need to assist the patient in arranging follow up appointment in our office and any transportation needs.  MAKE SURE YOU:  Understand these instructions.  Get help right away if you are not doing well or get worse.    Pick up stool softner and laxative for home use following surgery while on pain medications. Do not submerge incision under water. Please use good hand washing techniques while changing dressing each day. May shower starting three days after surgery. Please use a clean towel to pat the incision dry following showers. Continue to use ice for pain and swelling after surgery. Do not use any lotions or creams on the incision until  instructed by your surgeon.   Information on my medicine - XARELTO (Rivaroxaban)  Why was Xarelto prescribed for you? Xarelto was prescribed for you to reduce the risk of blood clots forming after orthopedic surgery. The medical term for these abnormal blood clots is venous thromboembolism (VTE).  What do you need to know about xarelto ? Take your Xarelto ONCE DAILY at the same time every day. You may take it either with or without food.  If you have difficulty swallowing the tablet whole, you may crush it and mix in applesauce just prior to taking your dose.  Take Xarelto exactly as prescribed by your doctor and DO NOT stop taking Xarelto without talking to the doctor who prescribed the medication.  Stopping without other VTE prevention medication to take the place of Xarelto may increase your risk of developing a clot.  After discharge, you should have regular check-up appointments with your healthcare provider that is prescribing your Xarelto.    What do you do if you miss a dose? If you miss a dose, take it as soon as you remember on the same day then continue your regularly scheduled once daily regimen the next day. Do not take two doses of Xarelto on the same day.   Important Safety Information A possible side effect of Xarelto is bleeding. You should call your healthcare provider right away if you experience any of the following: Bleeding from an injury or your nose that does not stop. Unusual colored urine (red or dark brown) or unusual colored stools (red or black). Unusual bruising for unknown reasons. A serious fall or if you hit your head (even if there is no bleeding).  Some medicines may interact with Xarelto and might increase your risk of bleeding while on Xarelto. To help avoid this, consult your healthcare provider or pharmacist prior to using any new prescription or non-prescription medications, including herbals, vitamins, non-steroidal anti-inflammatory  drugs (NSAIDs) and supplements.  This website has more information on Xarelto: [email or web address]   Increase activity slowly as tolerated    Complete by:  As directed      Allergies as of 12/19/2016      Reactions   Pravastatin Sodium    REACTION: anemia as per Dr Gilford Rile, [facility]      Medication List    STOP taking these medications   aspirin EC 81 MG tablet   BIOTIN 5000 5 MG Caps Generic drug:  Biotin   cholecalciferol 1000 units tablet Commonly known as:  VITAMIN D   ibuprofen 200 MG tablet Commonly known as:  ADVIL,MOTRIN   PRESERVISION AREDS 2 Caps   Vitamin B-12 2500 MCG Subl     TAKE these medications   atorvastatin 40 MG tablet Commonly known as:  LIPITOR TAKE ONE-HALF TABLET BY MOUTH EVERY DAY AT 6PM What changed:  See the new instructions.   B-D ULTRAFINE III SHORT PEN 31G X 8 MM Misc Generic drug:  Insulin Pen Needle USE AS DIRECTED WITH INSULIN   carvedilol 25 MG tablet Commonly known as:  COREG Take 12.5 mg by mouth 2 (two) times daily.   CLEAR EYES FOR DRY EYES 1-0.25 % Soln Generic drug:  Carboxymethylcellul-Glycerin Place 1-2 drops into both eyes 3 (three) times daily as needed (for dry/irritated eyes.).   furosemide 40 MG tablet Commonly known as:  LASIX TAKE ONE TABLET BY MOUTH ONCE DAILY What changed:  See the new instructions.   glucose blood test strip Commonly known as:  FREESTYLE LITE Use as instructed What changed:  how much to take  how to take this  when to take this  additional instructions   HUMALOG KWIKPEN 100 UNIT/ML KiwkPen Generic drug:  insulin lispro Inject 10 Units into the skin 3 (three) times daily before meals.   KLOR-CON M10 10 MEQ tablet Generic drug:  potassium chloride TAKE ONE TABLET BY MOUTH ON MONDAY, WEDNESDAY, AND FRIDAY What changed:  See the new instructions.   LANTUS SOLOSTAR 100 UNIT/ML Solostar Pen Generic drug:  Insulin Glargine Inject 35 Units into the skin at bedtime.   oxyCODONE 5  MG immediate release tablet Commonly known as:  Oxy IR/ROXICODONE Take 1-3 tablets (5-15 mg total) by mouth every 3 (three) hours as needed for breakthrough pain.   ramipril 10 MG capsule Commonly known as:  ALTACE TAKE 1 CAPSULE BY MOUTH ONCE DAILY   rivaroxaban 10 MG Tabs tablet Commonly known as:  XARELTO Take 1 tablet (10 mg total) by mouth daily with breakfast.   tiZANidine 4 MG capsule Commonly known as:  ZANAFLEX Take 1 capsule (4 mg total) by mouth 3 (three) times daily as needed for muscle spasms.   traMADol 50 MG tablet Commonly known as:  ULTRAM Take 1-2 tablets (50-100 mg total) by mouth every 6 (six) hours as needed for moderate pain.      Follow-up Information    Gaynelle Arabian, MD. Schedule an appointment as soon as possible for a visit on 01/01/2017.   Specialty:  Orthopedic Surgery Why:  Call [phone] tomorrow to make the appointment Contact information: [address] [place] [address] [phone]           Signed: Ardeen Jourdain, PA-C Orthopaedic Surgery 12/20/2016, 1:54 PM

## 2016-12-21 ENCOUNTER — Ambulatory Visit: Payer: Medicare Other | Attending: Orthopedic Surgery | Admitting: Physical Therapy

## 2016-12-21 DIAGNOSIS — Z96652 Presence of left artificial knee joint: Secondary | ICD-10-CM | POA: Diagnosis not present

## 2016-12-21 DIAGNOSIS — G8929 Other chronic pain: Secondary | ICD-10-CM

## 2016-12-21 DIAGNOSIS — M25562 Pain in left knee: Secondary | ICD-10-CM | POA: Insufficient documentation

## 2016-12-21 DIAGNOSIS — R262 Difficulty in walking, not elsewhere classified: Secondary | ICD-10-CM | POA: Diagnosis not present

## 2016-12-21 NOTE — Patient Instructions (Signed)
LEFS - 24/80  61m walk - 37.97 seconds   TUG - 41.32 seconds with RW  5x sit to stand -- essentially fully using her UEs with little to no use of her LLE   26-95 degrees with PT overpressure into flexion   0-124 on RLE   Banded HS curls with yellow t-band   Towel under heel   SLRs - mild quad lag   Gait observation - step to pattern with knee flexed throughout on LLE   No stairs in 24/7 help

## 2016-12-22 NOTE — Therapy (Addendum)
[facility] [address], [place], [address] Phone: [phone]   Fax:  [phone]  Physical Therapy Evaluation  Patient Details  Name: Taylor Castaneda MRN: [id number] Date of Birth: [birth year] No Data Recorded  Encounter Date: 12/21/2016      PT End of Session - 12/22/16 1903    Visit Number 1   Number of Visits 25   Date for PT Re-Evaluation 03/15/17   PT Start Time 0906   PT Stop Time 1002   PT Time Calculation (min) 56 min   Equipment Utilized During Treatment Gait belt   Activity Tolerance Patient tolerated treatment well   Behavior During Therapy [facility] for tasks assessed/performed      Past Medical History:  Diagnosis Date  . Anemia   . Diabetes mellitus, type II ([place])   . Glaucoma    Dr.Hecker  . History of colon cancer 1998   Dr. Lennie Hummer  . Hyperlipemia   . Osteopenia 02/2012, 03/2014   DEXA hip -2.2  . Renal insufficiency   . Stenosing tenosynovitis of finger of left hand 2016   index - s/p steroid injection x2    Past Surgical History:  Procedure Laterality Date  . ABI  11/2013   WNL  . APPENDECTOMY    . CARPAL TUNNEL RELEASE     Bilateral   . CATARACT EXTRACTION Bilateral 2006   Implants  . COLECTOMY  1998   For cancer  . COLONOSCOPY  2008   Dr.Stark, [place] 2011  . COLONOSCOPY  06/2013   1 hyperplastic polyp, ileocecal anastomosis [facility])  . dexa  02/2012, 03/2014   T score -2.2 at hip overall stable  . REFRACTIVE SURGERY    . TONSILLECTOMY AND ADENOIDECTOMY    . TOTAL KNEE ARTHROPLASTY Left 12/17/2016   Procedure: LEFT TOTAL KNEE ARTHROPLASTY;  Surgeon: Gaynelle Arabian, MD;  Location: [facility];  Service: Orthopedics;  Laterality: Left;    There were no vitals filed for this visit.       Subjective Assessment - 12/22/16 1904    Subjective Patient reports she had a L TKA on 12/17/2016. She had been a short community ambulator with a [place] prior to the operation. She had one  major fall in February on her L foot/twisting her L knee which led to her being in a w/c for roughly 2 months.    Limitations Standing;Walking;House hold activities   Diagnostic tests X-rays confirming end stage arthritis    Patient Stated Goals To be able to walk with less pain.    Currently in Pain? Yes   Pain Score 2    Pain Location Knee   Pain Orientation Left   Pain Descriptors / Indicators Aching;Operative site guarding   Pain Type Chronic pain;Surgical pain   Pain Onset More than a month ago   Pain Frequency Constant      LEFS - 24/80  84m walk - 37.97 seconds   TUG - 41.32 seconds with RW  5x sit to stand -- essentially fully using her UEs with little to no use of her LLE   26-95 degrees with PT overpressure into flexion   0-124 on RLE   Banded HS curls with yellow t-band - x 10 per side to emphasize intrinsic muscular output to increase knee flexion ROM   Towel under heel -- educated family and patient that she would need to have towel under heel, perform quad sets, to reduce knee extension deficit  SLRs - mild quad lag x 10 repetitions, though largely noted to be due to excessive knee extension loss. Appropriate for maintain for HEP  Gait observation - step to pattern with knee flexed throughout on LLE with RW, noted to be quite ineffecient and demonstrated Trendelenburg pattern bilaterally L>R. Decreased stride lengths and reduced hip extension bilaterally.   No stairs in 24/7 help            Objective measurements completed on examination: See above findings.                  PT Education - 12/22/16 1852    Education provided Yes   Education Details Necessity of increasing knee extension ROM, continue with ther-ex provided at hospital as it is still appropriate.    Person(s) Educated Patient;Child(ren)   Methods Demonstration;Explanation;Handout   Comprehension Verbalized understanding;Returned demonstration             PT Long  Term Goals - 12/22/16 1858      PT LONG TERM GOAL #1   Title Patient will report LEFS of greater than 40/80 to demonstrate improved tolerance for ADLs.    Time 12   Period Weeks   Status New     PT LONG TERM GOAL #2   Title Patient will ambulate 53m in less than 25" to demonstrate improved gait speed and reduced falls risk,    Time 12   Period Weeks   Status New     PT LONG TERM GOAL #3   Title Patient will TUG of less than 18" to demonstrate reduced falls risk with LRAD   Time 12   Period Weeks   Status New     PT LONG TERM GOAL #4   Title Patient will demonstrate less than 15 degrees of passive knee extension to demonstrate improved ability to ambulate efficiently.    Time 12   Period Weeks   Status New                Plan - 12/21/16 1256    Clinical Impression Statement Patient is an 83 y/o female s/p L TKA, she has had progressive detetioration of function prior to her surgery and demonstrates significant extension loss (24 degrees). Her loss of extension causes her gait to be inefficient and reduces her balance. She would benefit from skilled PT services to address her mobility deficits for a return to more independent lifestyle.    Clinical Presentation Stable   Clinical Decision Making Moderate   Rehab Potential Fair   PT Frequency 2x / week   PT Duration 12 weeks   PT Treatment/Interventions Cryotherapy;Electrical Stimulation;Biofeedback;Moist Heat;Gait Scientist, forensic;Therapeutic activities;Therapeutic exercise;Balance training;Taping;Neuromuscular re-education;Patient/family education;Passive range of motion;DME Instruction   PT Next Visit Plan Work on extension ROM   PT Home Exercise Plan SLR, HS curlks, knee extension with towel roll underneath her knee    Consulted and Agree with Plan of Care Patient      Patient will benefit from skilled therapeutic intervention in order to improve the following deficits and impairments:  Abnormal gait, Pain,  Decreased strength, Decreased range of motion, Decreased activity tolerance, Decreased balance, Decreased endurance, Difficulty walking, Decreased mobility, Decreased knowledge of use of DME  Visit Diagnosis: Status post left knee replacement - Plan: PT plan of care cert/re-cert  Difficulty in walking, not elsewhere classified - Plan: PT plan of care cert/re-cert  Chronic pain of left knee - Plan: PT plan of care cert/re-cert     Problem List  Patient Active Problem List   Diagnosis Date Noted  . OA (osteoarthritis) of knee 12/17/2016  . Pre-op evaluation 11/20/2016  . Left foot pain 07/20/2016  . Dry skin dermatitis 06/13/2016  . Health maintenance examination 01/30/2016  . Medicare annual wellness visit, subsequent 03/10/2014  . Advanced care planning/counseling discussion 03/10/2014  . PAD (peripheral artery disease) ([place]) 11/11/2013  . Dyspnea 09/18/2011  . PMR (polymyalgia rheumatica) ([place]) 08/27/2011  . Uncontrolled type 2 diabetes mellitus with nephropathy ([place]) 08/04/2009  . Essential hypertension 06/13/2009  . ANEMIA-NOS 01/24/2009  . CKD stage 3 due to type 2 diabetes mellitus ([place]) 01/24/2009  . GLAUCOMA, CLOSED-ANGLE 11/13/2007  . Osteopenia 11/13/2007  . COLON CANCER, HX OF 11/13/2007  . HLD (hyperlipidemia) 10/23/2006   Royce Macadamia PT, DPT, CSCS    Late Addendum exercise descriptions Royce Macadamia PT, DPT, CSCS   12/22/2016, 7:08 PM  [facility] [address] [place], [address] Phone: [phone]   Fax:  [phone]  Name: Taylor Castaneda MRN: [id number] Date of Birth: 04-May-1933

## 2016-12-24 ENCOUNTER — Other Ambulatory Visit: Payer: Self-pay | Admitting: Family Medicine

## 2016-12-25 ENCOUNTER — Ambulatory Visit: Payer: Medicare Other | Admitting: Physical Therapy

## 2016-12-25 DIAGNOSIS — R262 Difficulty in walking, not elsewhere classified: Secondary | ICD-10-CM

## 2016-12-25 DIAGNOSIS — G8929 Other chronic pain: Secondary | ICD-10-CM

## 2016-12-25 DIAGNOSIS — M25562 Pain in left knee: Secondary | ICD-10-CM

## 2016-12-25 DIAGNOSIS — Z96652 Presence of left artificial knee joint: Secondary | ICD-10-CM | POA: Diagnosis not present

## 2016-12-25 NOTE — Patient Instructions (Signed)
AROM 21-85  PROM 8-91  Sit to stands from elevated table   LAQs   SLRs  Quad sets with heel lift

## 2016-12-25 NOTE — Therapy (Signed)
[facility] [address], [place], [address] Phone: [phone]   Fax:  [phone]  Physical Therapy Treatment  Patient Details  Name: Taylor Castaneda MRN: [id number] Date of Birth: 12-Mar-1933 No Data Recorded  Encounter Date: 12/25/2016      PT End of Session - 12/25/16 1559    Visit Number 2   Number of Visits 25   Date for PT Re-Evaluation 03/15/17   PT Start Time 8127   PT Stop Time 1602   PT Time Calculation (min) 39 min   Equipment Utilized During Treatment Gait belt   Activity Tolerance Patient tolerated treatment well   Behavior During Therapy [facility] for tasks assessed/performed      Past Medical History:  Diagnosis Date  . Anemia   . Diabetes mellitus, type II ([place])   . Glaucoma    Dr.Hecker  . History of colon cancer 1998   Dr. Lennie Hummer  . Hyperlipemia   . Osteopenia 02/2012, 03/2014   DEXA hip -2.2  . Renal insufficiency   . Stenosing tenosynovitis of finger of left hand 2016   index - s/p steroid injection x2    Past Surgical History:  Procedure Laterality Date  . ABI  11/2013   WNL  . APPENDECTOMY    . CARPAL TUNNEL RELEASE     Bilateral   . CATARACT EXTRACTION Bilateral 2006   Implants  . COLECTOMY  1998   For cancer  . COLONOSCOPY  2008   Dr.Stark, Due 2011  . COLONOSCOPY  06/2013   1 hyperplastic polyp, ileocecal anastomosis [facility])  . dexa  02/2012, 03/2014   T score -2.2 at hip overall stable  . REFRACTIVE SURGERY    . TONSILLECTOMY AND ADENOIDECTOMY    . TOTAL KNEE ARTHROPLASTY Left 12/17/2016   Procedure: LEFT TOTAL KNEE ARTHROPLASTY;  Surgeon: Gaynelle Arabian, MD;  Location: [facility];  Service: Orthopedics;  Laterality: Left;    There were no vitals filed for this visit.      Subjective Assessment - 12/25/16 1552    Subjective Patient and children report she has been diligent with her HEP. They have had to adjust medications to keep her out of pain but she has  also had some hallucinations.    Limitations Standing;Walking;House hold activities   Diagnostic tests X-rays confirming end stage arthritis    Patient Stated Goals To be able to walk with less pain.    Currently in Pain? Yes   Pain Location Knee   Pain Orientation Left   Pain Descriptors / Indicators Aching;Operative site guarding   Pain Type Chronic pain;Surgical pain   Pain Onset More than a month ago   Pain Frequency Intermittent        AROM 21-85  PROM 8-91  Sit to stands from elevated table x 3 bouts x 6 repetitions with significant use of UEs, but notable for use of her LLE.  LAQs with 1.5# ankle weight x 12 repetitions x 3 sets with manual overpressure to go through full ROM into knee flexion   SLRs with 0.5-0.75# ankle weight x 10 for 3 sets to promote quadricep strengthening with more extended knee position  Quad sets with wedge underneath her heel x 10 for 2 sets with PT providing extra force/resistance into knee extension position on distal femur. Notable for increasing PROM from roughly 13-15 degrees to 8 degrees to conclude session.  PT Education - 12/25/16 1559    Education provided Yes   Person(s) Educated Patient;Child(ren)   Methods Explanation;Demonstration;Handout   Comprehension Verbalized understanding;Returned demonstration             PT Long Term Goals - 12/22/16 1858      PT LONG TERM GOAL #1   Title Patient will report LEFS of greater than 40/80 to demonstrate improved tolerance for ADLs.    Time 12   Period Weeks   Status New     PT LONG TERM GOAL #2   Title Patient will ambulate 59m in less than 25" to demonstrate improved gait speed and reduced falls risk,    Time 12   Period Weeks   Status New     PT LONG TERM GOAL #3   Title Patient will TUG of less than 18" to demonstrate reduced falls risk with LRAD   Time 12   Period Weeks   Status New     PT LONG TERM GOAL #4   Title Patient  will demonstrate less than 15 degrees of passive knee extension to demonstrate improved ability to ambulate efficiently.    Time 12   Period Weeks   Status New               Plan - 12/25/16 1600    Clinical Impression Statement Patient has made good progress with knee extension and flexion ROM indicative of positive soft tissue length changes. Her strength and pain tolerance are still quite low, impacting her mobility at this point, though she tolerates progression in loading this date. She will continue to benefit from skilled PT services to address her mobility limitations.    Clinical Presentation Stable   Clinical Decision Making Moderate   Rehab Potential Fair   PT Frequency 2x / week   PT Duration 12 weeks   PT Treatment/Interventions Cryotherapy;Electrical Stimulation;Biofeedback;Moist Heat;Gait Scientist, forensic;Therapeutic activities;Therapeutic exercise;Balance training;Taping;Neuromuscular re-education;Patient/family education;Passive range of motion;DME Instruction   PT Next Visit Plan Work on extension ROM   PT Home Exercise Plan SLR, HS curlks, knee extension with towel roll underneath her knee    Consulted and Agree with Plan of Care Patient      Patient will benefit from skilled therapeutic intervention in order to improve the following deficits and impairments:  Abnormal gait, Pain, Decreased strength, Decreased range of motion, Decreased activity tolerance, Decreased balance, Decreased endurance, Difficulty walking, Decreased mobility, Decreased knowledge of use of DME  Visit Diagnosis: Status post left knee replacement  Difficulty in walking, not elsewhere classified  Chronic pain of left knee     Problem List Patient Active Problem List   Diagnosis Date Noted  . OA (osteoarthritis) of knee 12/17/2016  . Pre-op evaluation 11/20/2016  . Left foot pain 07/20/2016  . Dry skin dermatitis 06/13/2016  . Health maintenance examination 01/30/2016  .  Medicare annual wellness visit, subsequent 03/10/2014  . Advanced care planning/counseling discussion 03/10/2014  . PAD (peripheral artery disease) ([place]) 11/11/2013  . Dyspnea 09/18/2011  . PMR (polymyalgia rheumatica) ([place]) 08/27/2011  . Uncontrolled type 2 diabetes mellitus with nephropathy ([place]) 08/04/2009  . Essential hypertension 06/13/2009  . ANEMIA-NOS 01/24/2009  . CKD stage 3 due to type 2 diabetes mellitus ([place]) 01/24/2009  . GLAUCOMA, CLOSED-ANGLE 11/13/2007  . Osteopenia 11/13/2007  . COLON CANCER, HX OF 11/13/2007  . HLD (hyperlipidemia) 10/23/2006   Royce Macadamia PT, DPT, CSCS    12/25/2016, 5:04 PM  [facility] [address] [place], [address] Phone: [phone]   Fax:  [phone]  Name: Taylor Castaneda MRN: [id number] Date of Birth: [birth year]

## 2016-12-28 ENCOUNTER — Ambulatory Visit: Payer: Medicare Other | Admitting: Physical Therapy

## 2016-12-28 ENCOUNTER — Other Ambulatory Visit: Payer: Self-pay | Admitting: Family Medicine

## 2016-12-28 DIAGNOSIS — Z96652 Presence of left artificial knee joint: Secondary | ICD-10-CM | POA: Diagnosis not present

## 2016-12-28 DIAGNOSIS — G8929 Other chronic pain: Secondary | ICD-10-CM

## 2016-12-28 DIAGNOSIS — M25562 Pain in left knee: Secondary | ICD-10-CM

## 2016-12-28 DIAGNOSIS — R262 Difficulty in walking, not elsewhere classified: Secondary | ICD-10-CM

## 2016-12-31 ENCOUNTER — Ambulatory Visit: Payer: Medicare Other | Admitting: Physical Therapy

## 2016-12-31 DIAGNOSIS — G8929 Other chronic pain: Secondary | ICD-10-CM

## 2016-12-31 DIAGNOSIS — E1165 Type 2 diabetes mellitus with hyperglycemia: Secondary | ICD-10-CM | POA: Diagnosis not present

## 2016-12-31 DIAGNOSIS — R262 Difficulty in walking, not elsewhere classified: Secondary | ICD-10-CM

## 2016-12-31 DIAGNOSIS — Z96652 Presence of left artificial knee joint: Secondary | ICD-10-CM

## 2016-12-31 DIAGNOSIS — M25562 Pain in left knee: Secondary | ICD-10-CM

## 2017-01-01 NOTE — Therapy (Signed)
[place] [facility] [address], [place], [address] Phone: [phone]   Fax:  [phone]  Physical Therapy Treatment  Patient Details  Name: Taylor Castaneda MRN: [id number] Date of Birth: 02-25-1933 No Data Recorded  Encounter Date: 12/31/2016      PT End of Session - 01/01/17 1835    Visit Number 4   Number of Visits 25   Date for PT Re-Evaluation 03/15/17   PT Start Time 7209   PT Stop Time 1530   PT Time Calculation (min) 45 min   Equipment Utilized During Treatment Gait belt   Activity Tolerance Patient tolerated treatment well   Behavior During Therapy [facility] for tasks assessed/performed      Past Medical History:  Diagnosis Date  . Anemia   . Diabetes mellitus, type II ([place])   . Glaucoma    Dr.Hecker  . History of colon cancer 1998   Dr. Lennie Hummer  . Hyperlipemia   . Osteopenia 02/2012, 03/2014   DEXA hip -2.2  . Renal insufficiency   . Stenosing tenosynovitis of finger of left hand 2016   index - s/p steroid injection x2    Past Surgical History:  Procedure Laterality Date  . ABI  11/2013   WNL  . APPENDECTOMY    . CARPAL TUNNEL RELEASE     Bilateral   . CATARACT EXTRACTION Bilateral 2006   Implants  . COLECTOMY  1998   For cancer  . COLONOSCOPY  2008   Dr.Stark, Due 2011  . COLONOSCOPY  06/2013   1 hyperplastic polyp, ileocecal anastomosis [facility])  . dexa  02/2012, 03/2014   T score -2.2 at hip overall stable  . REFRACTIVE SURGERY    . TONSILLECTOMY AND ADENOIDECTOMY    . TOTAL KNEE ARTHROPLASTY Left 12/17/2016   Procedure: LEFT TOTAL KNEE ARTHROPLASTY;  Surgeon: Gaynelle Arabian, MD;  Location: [facility];  Service: Orthopedics;  Laterality: Left;    There were no vitals filed for this visit.      Subjective Assessment - 01/01/17 1834    Subjective Patient and family report she is now walking more around the house and that her pain levels have continued to be mild, she will go in to  see PA for follow up tomorrow.    Limitations Standing;Walking;House hold activities   Diagnostic tests X-rays confirming end stage arthritis    Patient Stated Goals To be able to walk with less pain.    Currently in Pain? Yes  Reports as mild joint line pain medial>lateral   Pain Location Knee   Pain Orientation Left   Pain Descriptors / Indicators Aching   Pain Type Surgical pain;Chronic pain   Pain Onset More than a month ago   Pain Frequency Intermittent     Gait observation -- noted to have significant increase in knee extended position during gait, though still has some flexion which increase with prolonged gait. Much improved gait speed.  ROM 8-98  Leg Press 35# x 15 x 2 sets, 55# x 12 for 2 sets   Attempted step ups to 1 step, just riser with bilateral UE support.   TRX sit to stands 3 sets x 10 repetitions with cuing for how to properly use UEs   Standing hip abductions with yellow t-band x 8 for 2 sets with bilateral HHA   Manual overpressure into extension while laying supine with bolster under her ankle.  PT Education - 01/01/17 1835    Education provided Yes   Education Details Continue to focus on knee extension, will add strengthening exercises in follow up.    Person(s) Educated Patient;Child(ren)   Methods Explanation   Comprehension Verbalized understanding             PT Long Term Goals - 12/22/16 1858      PT LONG TERM GOAL #1   Title Patient will report LEFS of greater than 40/80 to demonstrate improved tolerance for ADLs.    Time 12   Period Weeks   Status New     PT LONG TERM GOAL #2   Title Patient will ambulate 63m in less than 25" to demonstrate improved gait speed and reduced falls risk,    Time 12   Period Weeks   Status New     PT LONG TERM GOAL #3   Title Patient will TUG of less than 18" to demonstrate reduced falls risk with LRAD   Time 12   Period Weeks   Status New     PT  LONG TERM GOAL #4   Title Patient will demonstrate less than 15 degrees of passive knee extension to demonstrate improved ability to ambulate efficiently.    Time 12   Period Weeks   Status New               Plan - 01/01/17 1835    Clinical Impression Statement Patient has made good progress towards knee flexion (to 98 degrees this date) and 8 degrees of knee extension. She is able to tolerate more progressive quadricep strengthening training, though will want to progress closed chain strengthening to tolerance. Her quad strength is still poor overall as she is unable to perform step ups to just a plastic riser even with bilateral UE support.    Clinical Presentation Stable   Clinical Decision Making Moderate   Rehab Potential Fair   PT Frequency 2x / week   PT Duration 12 weeks   PT Treatment/Interventions Cryotherapy;Electrical Stimulation;Biofeedback;Moist Heat;Gait Scientist, forensic;Therapeutic activities;Therapeutic exercise;Balance training;Taping;Neuromuscular re-education;Patient/family education;Passive range of motion;DME Instruction   PT Next Visit Plan Work on extension ROM   PT Home Exercise Plan SLR, HS curlks, knee extension with towel roll underneath her knee    Consulted and Agree with Plan of Care Patient      Patient will benefit from skilled therapeutic intervention in order to improve the following deficits and impairments:  Abnormal gait, Pain, Decreased strength, Decreased range of motion, Decreased activity tolerance, Decreased balance, Decreased endurance, Difficulty walking, Decreased mobility, Decreased knowledge of use of DME  Visit Diagnosis: Status post left knee replacement  Difficulty in walking, not elsewhere classified  Chronic pain of left knee     Problem List Patient Active Problem List   Diagnosis Date Noted  . OA (osteoarthritis) of knee 12/17/2016  . Pre-op evaluation 11/20/2016  . Left foot pain 07/20/2016  . Dry skin  dermatitis 06/13/2016  . Health maintenance examination 01/30/2016  . Medicare annual wellness visit, subsequent 03/10/2014  . Advanced care planning/counseling discussion 03/10/2014  . PAD (peripheral artery disease) ([place]) 11/11/2013  . Dyspnea 09/18/2011  . PMR (polymyalgia rheumatica) ([place]) 08/27/2011  . Uncontrolled type 2 diabetes mellitus with nephropathy ([place]) 08/04/2009  . Essential hypertension 06/13/2009  . ANEMIA-NOS 01/24/2009  . CKD stage 3 due to type 2 diabetes mellitus ([place]) 01/24/2009  . GLAUCOMA, CLOSED-ANGLE 11/13/2007  . Osteopenia 11/13/2007  . COLON CANCER, HX OF 11/13/2007  .  HLD (hyperlipidemia) 10/23/2006   Royce Macadamia PT, DPT, CSCS    01/01/2017, 6:38 PM   [place] [facility] [address] [place], [address] Phone: [phone]   Fax:  [phone]  Name: THOMASINA HOUSLEY MRN: [id number] Date of Birth: [birth year]

## 2017-01-01 NOTE — Therapy (Signed)
[place] [facility] [address] [place], [address] Phone: [phone]   Fax:  [phone]  Physical Therapy Treatment  Patient Details  Name: Taylor Castaneda MRN: [id number] Date of Birth: 23-Dec-1932 No Data Recorded  Encounter Date: 12/28/2016      PT End of Session - 01/01/17 0948    Visit Number 3   Number of Visits 25   Date for PT Re-Evaluation 03/15/17   PT Start Time 1130   PT Stop Time 1215   PT Time Calculation (min) 45 min   Equipment Utilized During Treatment Gait belt   Activity Tolerance Patient tolerated treatment well   Behavior During Therapy [facility] for tasks assessed/performed      Past Medical History:  Diagnosis Date  . Anemia   . Diabetes mellitus, type II ([place])   . Glaucoma    Dr.Hecker  . History of colon cancer 1998   Dr. Lennie Hummer  . Hyperlipemia   . Osteopenia 02/2012, 03/2014   DEXA hip -2.2  . Renal insufficiency   . Stenosing tenosynovitis of finger of left hand 2016   index - s/p steroid injection x2    Past Surgical History:  Procedure Laterality Date  . ABI  11/2013   WNL  . APPENDECTOMY    . CARPAL TUNNEL RELEASE     Bilateral   . CATARACT EXTRACTION Bilateral 2006   Implants  . COLECTOMY  1998   For cancer  . COLONOSCOPY  2008   Dr.Stark, Due 2011  . COLONOSCOPY  06/2013   1 hyperplastic polyp, ileocecal anastomosis [facility])  . dexa  02/2012, 03/2014   T score -2.2 at hip overall stable  . REFRACTIVE SURGERY    . TONSILLECTOMY AND ADENOIDECTOMY    . TOTAL KNEE ARTHROPLASTY Left 12/17/2016   Procedure: LEFT TOTAL KNEE ARTHROPLASTY;  Surgeon: Gaynelle Arabian, MD;  [facility];  Service: Orthopedics;  Laterality: Left;    There were no vitals filed for this visit.      Subjective Assessment - 01/01/17 0947    Subjective Patient and family report her HEP is going well, she is ambulating around the house better/more frequently and her pain has largely been  under control.    Limitations Standing;Walking;House hold activities   Diagnostic tests X-rays confirming end stage arthritis    Patient Stated Goals To be able to walk with less pain.    Currently in Pain? Other (Comment)  Reports mild discomfort in the knee joint, but very tolerable.   Pain Onset More than a month ago      ROM initially 9-90 degrees   Perform SLRs with [place] stim continuous on medial and lateral quadriceps musculature x 12 for 3 sets with 0.5, 0.75, 1.5# ankle weights progressively   Gait observed- noted to have mid-foot strike on LLE and resultant Trendelenburg due to knee extensor and glute med weakness. Cuing to have more heel strike to lock in knee and noted to have improved gait speed and upright posture during ambulation, reinforced cuing multiple times, though she appears to be limited now by passive rather than active ROM at the knee joint into extension.   Sit to stands from black mat table x 10 for 4 sets -- able to complete from minimal height from bottom on table with out use of UEs with improved LLE weightbearing.   Knee extension weighted holds and quad sets with 2-4# ankle weights over the knee joint to increase  knee ROM  Knee flexion mobs in sitting grade III x 2 minutes with rest breaks provided to tolerance.   Knee ROM to conclude session 7-96 degrees                            PT Education - 01/01/17 0948    Education provided Yes   Education Details Continue to work on knee extension ROM moreso than flexion for gait efficiency.    Person(s) Educated Patient;Child(ren)   Methods Explanation   Comprehension Verbalized understanding             PT Long Term Goals - 12/22/16 1858      PT LONG TERM GOAL #1   Title Patient will report LEFS of greater than 40/80 to demonstrate improved tolerance for ADLs.    Time 12   Period Weeks   Status New     PT LONG TERM GOAL #2   Title Patient will ambulate 54m in less than  25" to demonstrate improved gait speed and reduced falls risk,    Time 12   Period Weeks   Status New     PT LONG TERM GOAL #3   Title Patient will TUG of less than 18" to demonstrate reduced falls risk with LRAD   Time 12   Period Weeks   Status New     PT LONG TERM GOAL #4   Title Patient will demonstrate less than 15 degrees of passive knee extension to demonstrate improved ability to ambulate efficiently.    Time 12   Period Weeks   Status New               Plan - 01/01/17 0949    Clinical Impression Statement Patient demonstrates good progress with knee extension ROM throughout this session and since evaluation. She continues to have very mild lag with SLR, though much improved. She is able to increase stride length and transition from step to, into step through gait pattern, though verbal cues required. Her gait speed and balance are improving steadily with cuing.    Clinical Presentation Stable   Clinical Decision Making Moderate   Rehab Potential Fair   PT Frequency 2x / week   PT Duration 12 weeks   PT Treatment/Interventions Cryotherapy;Electrical Stimulation;Biofeedback;Moist Heat;Gait Scientist, forensic;Therapeutic activities;Therapeutic exercise;Balance training;Taping;Neuromuscular re-education;Patient/family education;Passive range of motion;DME Instruction   PT Next Visit Plan Work on extension ROM   PT Home Exercise Plan SLR, HS curlks, knee extension with towel roll underneath her knee    Consulted and Agree with Plan of Care Patient      Patient will benefit from skilled therapeutic intervention in order to improve the following deficits and impairments:  Abnormal gait, Pain, Decreased strength, Decreased range of motion, Decreased activity tolerance, Decreased balance, Decreased endurance, Difficulty walking, Decreased mobility, Decreased knowledge of use of DME  Visit Diagnosis: Status post left knee replacement  Difficulty in walking, not  elsewhere classified  Chronic pain of left knee     Problem List Patient Active Problem List   Diagnosis Date Noted  . OA (osteoarthritis) of knee 12/17/2016  . Pre-op evaluation 11/20/2016  . Left foot pain 07/20/2016  . Dry skin dermatitis 06/13/2016  . Health maintenance examination 01/30/2016  . Medicare annual wellness visit, subsequent 03/10/2014  . Advanced care planning/counseling discussion 03/10/2014  . PAD (peripheral artery disease) ([place]) 11/11/2013  . Dyspnea 09/18/2011  . PMR (polymyalgia rheumatica) ([place]) 08/27/2011  . Uncontrolled  type 2 diabetes mellitus with nephropathy ([place]) 08/04/2009  . Essential hypertension 06/13/2009  . ANEMIA-NOS 01/24/2009  . CKD stage 3 due to type 2 diabetes mellitus ([place]) 01/24/2009  . GLAUCOMA, CLOSED-ANGLE 11/13/2007  . Osteopenia 11/13/2007  . COLON CANCER, HX OF 11/13/2007  . HLD (hyperlipidemia) 10/23/2006    Royce Macadamia PT, DPT, CSCS    01/01/2017, 9:51 AM  [place] [facility] [address], [place], [address] Phone: [phone]   Fax:  [phone]  Name: Taylor Castaneda MRN: [id number] Date of Birth: May 08, 1933

## 2017-01-02 ENCOUNTER — Ambulatory Visit: Payer: Medicare Other | Attending: Orthopedic Surgery | Admitting: Physical Therapy

## 2017-01-02 DIAGNOSIS — G8929 Other chronic pain: Secondary | ICD-10-CM | POA: Insufficient documentation

## 2017-01-02 DIAGNOSIS — M25562 Pain in left knee: Secondary | ICD-10-CM | POA: Insufficient documentation

## 2017-01-02 DIAGNOSIS — Z96652 Presence of left artificial knee joint: Secondary | ICD-10-CM | POA: Insufficient documentation

## 2017-01-02 DIAGNOSIS — R262 Difficulty in walking, not elsewhere classified: Secondary | ICD-10-CM

## 2017-01-02 NOTE — Therapy (Signed)
[place] [facility] [address] [place], [address] Phone: [phone]   Fax:  [phone]  Physical Therapy Treatment  Patient Details  Name: Taylor Castaneda MRN: [id number] Date of Birth: May 28, 1933 No Data Recorded  Encounter Date: 01/02/2017      PT End of Session - 01/02/17 1248    Visit Number 5   Number of Visits 25   Date for PT Re-Evaluation 03/15/17   PT Start Time 1120   PT Stop Time 1200   PT Time Calculation (min) 40 min   Equipment Utilized During Treatment Gait belt   Activity Tolerance Patient tolerated treatment well   Behavior During Therapy [facility] for tasks assessed/performed      Past Medical History:  Diagnosis Date  . Anemia   . Diabetes mellitus, type II ([place])   . Glaucoma    Dr.Hecker  . History of colon cancer 1998   Dr. Lennie Hummer  . Hyperlipemia   . Osteopenia 02/2012, 03/2014   DEXA hip -2.2  . Renal insufficiency   . Stenosing tenosynovitis of finger of left hand 2016   index - s/p steroid injection x2    Past Surgical History:  Procedure Laterality Date  . ABI  11/2013   WNL  . APPENDECTOMY    . CARPAL TUNNEL RELEASE     Bilateral   . CATARACT EXTRACTION Bilateral 2006   Implants  . COLECTOMY  1998   For cancer  . COLONOSCOPY  2008   Dr.Stark, Due 2011  . COLONOSCOPY  06/2013   1 hyperplastic polyp, ileocecal anastomosis [facility] Plan)  . dexa  02/2012, 03/2014   T score -2.2 at hip overall stable  . REFRACTIVE SURGERY    . TONSILLECTOMY AND ADENOIDECTOMY    . TOTAL KNEE ARTHROPLASTY Left 12/17/2016   Procedure: LEFT TOTAL KNEE ARTHROPLASTY;  Surgeon: Gaynelle Arabian, MD;  Location: [facility];  Service: Orthopedics;  Laterality: Left;    There were no vitals filed for this visit.      Subjective Assessment - 01/02/17 1123    Subjective Patient reports she had her stitches/staples removed yesterday, and got a good report from her PA. She is scheduled to go back in 3 weeks.     Limitations Standing;Walking;House hold activities   Diagnostic tests X-rays confirming end stage arthritis    Patient Stated Goals To be able to walk with less pain.    Currently in Pain? Yes   Pain Onset More than a month ago      Initial extension ROM - 11 degrees  With multiple bouts of 4# ankle weight on knee and PT providing extension mobilizations was able to get to 5 degrees  Gait training -- cued patient to hit heel first, transitioning to her big toe (she had been striking with mid-foot) with cuing she was able to more consistently heel strike with RW, on video feedback this led to improved gait speed and stability. She continues to have functional limb length inequality due to passive knee extension loss (as well as quad strength loss)   Standing hip abductions with yellow t-band x 10 for 2 sets bilaterally while holding on to RW  TKEs with red t-bands x 20 for 3 sets on LLE with cuing to use quadricep to complete full extension (through the available range).  PT Education - 01/02/17 1247    Education provided Yes   Education Details Progressed HEP to TKEs and standing hip abduction with continued emphasis on knee extension mobilizations.    Person(s) Educated Patient;Child(ren)   Methods Explanation;Demonstration;Handout   Comprehension Verbalized understanding;Returned demonstration             PT Long Term Goals - 12/22/16 1858      PT LONG TERM GOAL #1   Title Patient will report LEFS of greater than 40/80 to demonstrate improved tolerance for ADLs.    Time 12   Period Weeks   Status New     PT LONG TERM GOAL #2   Title Patient will ambulate 1m in less than 25" to demonstrate improved gait speed and reduced falls risk,    Time 12   Period Weeks   Status New     PT LONG TERM GOAL #3   Title Patient will TUG of less than 18" to demonstrate reduced falls risk with LRAD   Time 12   Period Weeks   Status  New     PT LONG TERM GOAL #4   Title Patient will demonstrate less than 15 degrees of passive knee extension to demonstrate improved ability to ambulate efficiently.    Time 12   Period Weeks   Status New               Plan - 01/02/17 1248    Clinical Impression Statement Patient continues to improve with passive knee extension ROM and noted to be able to translate this with ambulation (able to improve flexion and extension cycling during ambulation). She will continue to benefit from extension focus before increasing on knee flexion.    Clinical Presentation Stable   Clinical Decision Making Moderate   Rehab Potential Fair   PT Frequency 2x / week   PT Duration 12 weeks   PT Treatment/Interventions Cryotherapy;Electrical Stimulation;Biofeedback;Moist Heat;Gait Scientist, forensic;Therapeutic activities;Therapeutic exercise;Balance training;Taping;Neuromuscular re-education;Patient/family education;Passive range of motion;DME Instruction   PT Next Visit Plan Work on extension ROM   PT Home Exercise Plan SLR, HS curlks, knee extension with towel roll underneath her knee    Consulted and Agree with Plan of Care Patient      Patient will benefit from skilled therapeutic intervention in order to improve the following deficits and impairments:  Abnormal gait, Pain, Decreased strength, Decreased range of motion, Decreased activity tolerance, Decreased balance, Decreased endurance, Difficulty walking, Decreased mobility, Decreased knowledge of use of DME  Visit Diagnosis: Status post left knee replacement  Chronic pain of left knee  Difficulty in walking, not elsewhere classified     Problem List Patient Active Problem List   Diagnosis Date Noted  . OA (osteoarthritis) of knee 12/17/2016  . Pre-op evaluation 11/20/2016  . Left foot pain 07/20/2016  . Dry skin dermatitis 06/13/2016  . Health maintenance examination 01/30/2016  . Medicare annual wellness visit,  subsequent 03/10/2014  . Advanced care planning/counseling discussion 03/10/2014  . PAD (peripheral artery disease) ([place]) 11/11/2013  . Dyspnea 09/18/2011  . PMR (polymyalgia rheumatica) ([place]) 08/27/2011  . Uncontrolled type 2 diabetes mellitus with nephropathy ([place]) 08/04/2009  . Essential hypertension 06/13/2009  . ANEMIA-NOS 01/24/2009  . CKD stage 3 due to type 2 diabetes mellitus ([place]) 01/24/2009  . GLAUCOMA, CLOSED-ANGLE 11/13/2007  . Osteopenia 11/13/2007  . COLON CANCER, HX OF 11/13/2007  . HLD (hyperlipidemia) 10/23/2006   Royce Macadamia PT, DPT, CSCS    01/02/2017, 12:50 PM  [place]  [place] [facility] [address] [place], [address] Phone: [phone]   Fax:  [phone]  Name: Taylor Castaneda MRN: [id number] Date of Birth: [birth year]

## 2017-01-04 ENCOUNTER — Encounter: Payer: Medicare Other | Admitting: Physical Therapy

## 2017-01-06 ENCOUNTER — Emergency Department
Admission: EM | Admit: 2017-01-06 | Discharge: 2017-01-07 | Disposition: A | Discharge status: Home / Self Care | Payer: Medicare Other | Attending: Emergency Medicine | Admitting: Emergency Medicine

## 2017-01-06 DIAGNOSIS — Z794 Long term (current) use of insulin: Secondary | ICD-10-CM | POA: Diagnosis not present

## 2017-01-06 DIAGNOSIS — Z96652 Presence of left artificial knee joint: Secondary | ICD-10-CM | POA: Diagnosis not present

## 2017-01-06 DIAGNOSIS — Z79899 Other long term (current) drug therapy: Secondary | ICD-10-CM | POA: Insufficient documentation

## 2017-01-06 DIAGNOSIS — R55 Syncope and collapse: Secondary | ICD-10-CM | POA: Diagnosis not present

## 2017-01-06 DIAGNOSIS — N183 Chronic kidney disease, stage 3 (moderate): Secondary | ICD-10-CM | POA: Insufficient documentation

## 2017-01-06 DIAGNOSIS — E1140 Type 2 diabetes mellitus with diabetic neuropathy, unspecified: Secondary | ICD-10-CM | POA: Insufficient documentation

## 2017-01-06 DIAGNOSIS — Z87891 Personal history of nicotine dependence: Secondary | ICD-10-CM | POA: Diagnosis not present

## 2017-01-06 DIAGNOSIS — I129 Hypertensive chronic kidney disease with stage 1 through stage 4 chronic kidney disease, or unspecified chronic kidney disease: Secondary | ICD-10-CM | POA: Insufficient documentation

## 2017-01-06 DIAGNOSIS — Z7901 Long term (current) use of anticoagulants: Secondary | ICD-10-CM | POA: Diagnosis not present

## 2017-01-06 LAB — BASIC METABOLIC PANEL
ANION GAP: 7 (ref 5–15)
BUN: 32 mg/dL — AB (ref 6–20)
CHLORIDE: 111 mmol/L (ref 101–111)
CO2: 22 mmol/L (ref 22–32)
Calcium: 8.3 mg/dL — ABNORMAL LOW (ref 8.9–10.3)
Creatinine, Ser: 1.53 mg/dL — ABNORMAL HIGH (ref 0.44–1.00)
GFR, EST AFRICAN AMERICAN: 35 mL/min — AB (ref >60)
GFR, EST NON AFRICAN AMERICAN: 31 mL/min — AB (ref >60)
Glucose, Bld: 103 mg/dL — ABNORMAL HIGH (ref 65–99)
POTASSIUM: 4.2 mmol/L (ref 3.5–5.1)
SODIUM: 140 mmol/L (ref 135–145)

## 2017-01-06 LAB — CBC
HEMATOCRIT: 28.7 % — AB (ref 35.0–47.0)
HEMOGLOBIN: 9.6 g/dL — AB (ref 12.0–16.0)
MCH: 31.4 pg (ref 26.0–34.0)
MCHC: 33.5 g/dL (ref 32.0–36.0)
MCV: 93.8 fL (ref 80.0–100.0)
Platelets: 216 10*3/uL (ref 150–440)
RBC: 3.05 MIL/uL — AB (ref 3.80–5.20)
RDW: 14.6 % — ABNORMAL HIGH (ref 11.5–14.5)
WBC: 8.4 10*3/uL (ref 3.6–11.0)

## 2017-01-06 LAB — TROPONIN I

## 2017-01-06 NOTE — ED Triage Notes (Signed)
Pt presents to ED via [facility] from home for a possible syncopal episode. Pt was eating supper at table when she fell asleep. Family woke her up and she went back out. EMS reports that she never fell out of chair. EMS reports BP of 68/34 initially. Gave 500 ml NS and brought BP up to 108/40. Pt denies pain, no complaints. CBG 127. Ate egg sandwich and ice cream for supper. Pt have knee replacement on L side 7/16, was started on oxy. Stopped that pain medicine and started on tramadol.

## 2017-01-06 NOTE — ED Provider Notes (Signed)
[facility] Emergency Department Provider Note   ____________________________________________   I have reviewed the triage vital signs and the nursing notes.   HISTORY  Chief Complaint Hypotension   History limited by: Not Limited   HPI Taylor Castaneda is a 83 y.o. female who presents to the emergency department today because of concerns for syncopal episode. This occurred at home. Occurred just prior to presentation. The patient was sitting down at the dinner table. Family felt she went to sleep so they woke her up. Shortly after she had a syncopal episode. Patient denies any concurrent chest pain or shortness of breath. No nausea or vomiting. The patient had recently been started on tramadol for the past 3 days and received 2 tramadol roughly 30 minutes prior to this episode. The time of examination patient states she feels back to baseline.   Past Medical History:  Diagnosis Date  . Anemia   . Diabetes mellitus, type II ([place])   . Glaucoma    Dr.Hecker  . History of colon cancer 1998   Dr. Lennie Hummer  . Hyperlipemia   . Osteopenia 02/2012, 03/2014   DEXA hip -2.2  . Renal insufficiency   . Stenosing tenosynovitis of finger of left hand 2016   index - s/p steroid injection x2    Patient Active Problem List   Diagnosis Date Noted  . OA (osteoarthritis) of knee 12/17/2016  . Pre-op evaluation 11/20/2016  . Left foot pain 07/20/2016  . Dry skin dermatitis 06/13/2016  . Health maintenance examination 01/30/2016  . Medicare annual wellness visit, subsequent 03/10/2014  . Advanced care planning/counseling discussion 03/10/2014  . PAD (peripheral artery disease) ([place]) 11/11/2013  . Dyspnea 09/18/2011  . PMR (polymyalgia rheumatica) ([place]) 08/27/2011  . Uncontrolled type 2 diabetes mellitus with nephropathy ([place]) 08/04/2009  . Essential hypertension 06/13/2009  . ANEMIA-NOS 01/24/2009  . CKD stage 3 due to type 2 diabetes mellitus ([place]) 01/24/2009  .  GLAUCOMA, CLOSED-ANGLE 11/13/2007  . Osteopenia 11/13/2007  . COLON CANCER, HX OF 11/13/2007  . HLD (hyperlipidemia) 10/23/2006    Past Surgical History:  Procedure Laterality Date  . ABI  11/2013   WNL  . APPENDECTOMY    . CARPAL TUNNEL RELEASE     Bilateral   . CATARACT EXTRACTION Bilateral 2006   Implants  . COLECTOMY  1998   For cancer  . COLONOSCOPY  2008   Dr.Stark, Due 2011  . COLONOSCOPY  06/2013   1 hyperplastic polyp, ileocecal anastomosis [facility] Plan)  . dexa  02/2012, 03/2014   T score -2.2 at hip overall stable  . REFRACTIVE SURGERY    . TONSILLECTOMY AND ADENOIDECTOMY    . TOTAL KNEE ARTHROPLASTY Left 12/17/2016   Procedure: LEFT TOTAL KNEE ARTHROPLASTY;  Surgeon: Gaynelle Arabian, MD;  Location: [facility];  Service: Orthopedics;  Laterality: Left;    Prior to Admission medications   Medication Sig Start Date End Date Taking? Authorizing Provider  atorvastatin (LIPITOR) 40 MG tablet TAKE ONE-HALF TABLET BY MOUTH EVERY DAY AT 6PM Patient taking differently: TAKE ONE-HALF TABLET(20 MG)  BY MOUTH EVERY DAY AT NIGHT 12/06/16   [name], MD  B-D ULTRAFINE III SHORT PEN 31G X 8 MM MISC USE AS DIRECTED WITH INSULIN 07/09/11   Hendricks Limes, MD  Carboxymethylcellul-Glycerin (CLEAR EYES FOR DRY EYES) 1-0.25 % SOLN Place 1-2 drops into both eyes 3 (three) times daily as needed (for dry/irritated eyes.).    [provider]  carvedilol (COREG) 25 MG tablet Take  12.5 mg by mouth 2 (two) times daily. 09/22/16   [provider]  furosemide (LASIX) 40 MG tablet TAKE ONE TABLET BY MOUTH ONCE DAILY 12/28/16   [name], MD  glucose blood (FREESTYLE LITE) test strip Use as instructed Patient taking differently: 1 each by Other route 4 (four) times daily. Use as instructed 02/09/11   Hendricks Limes, MD  HUMALOG KWIKPEN 100 UNIT/ML KiwkPen Inject 10 Units into the skin 3 (three) times daily before meals. 10/04/16   [provider]  KLOR-CON M10 10 MEQ  tablet TAKE ONE TABLET BY MOUTH ON MONDAY, [place], AND FRIDAY Patient taking differently: TAKE ONE TABLET BY MOUTH ON MONDAY, WEDNESDAY, AND FRIDAY IN THE MORNING 06/14/16   [name], MD  LANTUS SOLOSTAR 100 UNIT/ML Solostar Pen Inject 35 Units into the skin at bedtime. 11/12/16   [provider]  oxyCODONE (OXY IR/ROXICODONE) 5 MG immediate release tablet Take 1-3 tablets (5-15 mg total) by mouth every 3 (three) hours as needed for breakthrough pain. 12/19/16   Constable, Amber, PA-C  ramipril (ALTACE) 10 MG capsule TAKE 1 CAPSULE BY MOUTH ONCE DAILY 10/31/16   [name], MD  rivaroxaban (XARELTO) 10 MG TABS tablet Take 1 tablet (10 mg total) by mouth daily with breakfast. 12/19/16   Cecilio [name], Amber, PA-C  tiZANidine (ZANAFLEX) 4 MG capsule Take 1 capsule (4 mg total) by mouth 3 (three) times daily as needed for muscle spasms. 12/18/16   Constable, Amber, PA-C  traMADol (ULTRAM) 50 MG tablet Take 1-2 tablets (50-100 mg total) by mouth every 6 (six) hours as needed for moderate pain. 12/18/16   Cecilio [name], Amber, PA-C    Allergies Pravastatin sodium  Family History  Problem Relation Age of Onset  . Heart attack Father 63  . Stroke Father 72  . Diabetes Father   . Hypertension Father   . Heart disease Father        before age 73  . Breast cancer Mother   . Cancer Mother   . Diabetes Sister   . Cancer Sister   . Peripheral vascular disease Sister   . Breast cancer Sister   . Hypertension Son   . COPD Neg Hx   . Asthma Neg Hx   . Colon cancer Neg Hx   . Esophageal cancer Neg Hx   . Rectal cancer Neg Hx   . Stomach cancer Neg Hx     Social History Social History  Substance Use Topics  . Smoking status: Former Smoker    Years: 2.00    Types: Cigarettes    Quit date: 06/04/1973  . Smokeless tobacco: Never Used     Comment: Smoked for 5 years 1965-1970 up to 1/2 pp week  . Alcohol use Yes     Comment: Wine once in a while    Review of  Systems Constitutional: No fever/chills. Positive for syncopal episode. Eyes: No visual changes. ENT: No sore throat. Cardiovascular: Denies chest pain. Respiratory: Denies shortness of breath. Gastrointestinal: No abdominal pain.  No nausea, no vomiting.  No diarrhea.   Genitourinary: Negative for dysuria. Musculoskeletal: Positive for knee pain. Skin: Negative for rash. Neurological: Negative for headaches, focal weakness or numbness.  ____________________________________________   PHYSICAL EXAM:  VITAL SIGNS: ED Triage Vitals [01/06/17 2105]  Enc Vitals Group     BP (!) 129/47     Pulse Rate 70     Resp 16     Temp 98 F (36.7 C)     Temp Source  Oral     SpO2 98 %     Weight      Height      Head Circumference      Peak Flow      Pain Score 0   Constitutional: Alert and oriented. Well appearing and in no distress. Eyes: Conjunctivae are normal.  ENT   Head: Normocephalic and atraumatic.   Nose: No congestion/rhinnorhea.   Mouth/Throat: Mucous membranes are moist.   Neck: No stridor. Hematological/Lymphatic/Immunilogical: No cervical lymphadenopathy. Cardiovascular: Normal rate, regular rhythm.  No murmurs, rubs, or gallops.  Respiratory: Normal respiratory effort without tachypnea nor retractions. Breath sounds are clear and equal bilaterally. No wheezes/rales/rhonchi. Gastrointestinal: Soft and non tender. No rebound. No guarding.  Genitourinary: Deferred Musculoskeletal: Normal range of motion in all extremities.  Neurologic:  Normal speech and language. No gross focal neurologic deficits are appreciated.  Skin:  Skin is warm, dry and intact. No rash noted. Psychiatric: Mood and affect are normal. Speech and behavior are normal. Patient exhibits appropriate insight and judgment.  ____________________________________________    LABS (pertinent positives/negatives)  Labs Reviewed  BASIC METABOLIC PANEL - Abnormal; Notable for the following:        Result Value   Glucose, Bld 103 (*)    BUN 32 (*)    Creatinine, Ser 1.53 (*)    Calcium 8.3 (*)    GFR calc non Af Amer 31 (*)    GFR calc Af Amer 35 (*)    All other components within normal limits  CBC - Abnormal; Notable for the following:    RBC 3.05 (*)    Hemoglobin 9.6 (*)    HCT 28.7 (*)    RDW 14.6 (*)    All other components within normal limits  TROPONIN I  URINALYSIS, COMPLETE (UACMP) WITH MICROSCOPIC  TROPONIN I  CBG MONITORING, ED     ____________________________________________   EKG  I, Nance Pear, attending physician, personally viewed and interpreted this EKG  EKG Time: 2127 Rate: 67 Rhythm: normal sinus rhythm Axis: right axis deviation Intervals: qtc 429 QRS: low voltage, LPFB ST changes: no st elevation Impression: abnormal ekg  ____________________________________________    RADIOLOGY  None  ____________________________________________   PROCEDURES  Procedures  ____________________________________________   INITIAL IMPRESSION / ASSESSMENT AND PLAN / ED COURSE  Pertinent labs & imaging results that were available during my care of the patient were reviewed by me and considered in my medical decision making (see chart for details).  Patient presented to the emergency department today after syncopal episode at home. Will check blood work included troponin.  ----------------------------------------- 12:15 AM on 01/07/2017 -----------------------------------------   OBSERVATION CARE: This patient is being placed under observation care for the following reasons: Syncopal episode, repeat testing to rule out cardiac ischemia.   ----------------------------------------- 12:15 AM on 01/07/2017 -----------------------------------------  Patient continues states that she feels okay. 2nd troponin pending at time of sign out.    ____________________________________________   FINAL CLINICAL IMPRESSION(S) / ED  DIAGNOSES  Final diagnoses:  Syncope, unspecified syncope type     Note: This dictation was prepared with Dragon dictation. Any transcriptional errors that result from this process are unintentional     Nance Pear, MD 01/07/17 [phone]

## 2017-01-06 NOTE — ED Notes (Signed)
Pt denies dizziness upon standing, denies feeling weak. Family at bedside.

## 2017-01-07 ENCOUNTER — Ambulatory Visit: Payer: Medicare Other | Admitting: Physical Therapy

## 2017-01-07 DIAGNOSIS — R262 Difficulty in walking, not elsewhere classified: Secondary | ICD-10-CM

## 2017-01-07 DIAGNOSIS — Z96652 Presence of left artificial knee joint: Secondary | ICD-10-CM | POA: Diagnosis not present

## 2017-01-07 DIAGNOSIS — G8929 Other chronic pain: Secondary | ICD-10-CM

## 2017-01-07 DIAGNOSIS — M25562 Pain in left knee: Secondary | ICD-10-CM

## 2017-01-07 LAB — TROPONIN I

## 2017-01-07 NOTE — Therapy (Signed)
[facility] [address], [place], [address] Phone: [phone]   Fax:  [phone]  Physical Therapy Treatment  Patient Details  Name: Taylor Castaneda MRN: [id number] Date of Birth: 09-16-1932 No Data Recorded  Encounter Date: 01/07/2017      PT End of Session - 01/07/17 1007    Visit Number 6   Number of Visits 25   Date for PT Re-Evaluation 03/15/17   PT Start Time 0955   PT Stop Time 1040   PT Time Calculation (min) 45 min   Equipment Utilized During Treatment Gait belt   Activity Tolerance Patient tolerated treatment well   Behavior During Therapy [facility] for tasks assessed/performed      Past Medical History:  Diagnosis Date  . Anemia   . Diabetes mellitus, type II ([place])   . Glaucoma    Dr.Hecker  . History of colon cancer 1998   Dr. Lennie Hummer  . Hyperlipemia   . Osteopenia 02/2012, 03/2014   DEXA hip -2.2  . Renal insufficiency   . Stenosing tenosynovitis of finger of left hand 2016   index - s/p steroid injection x2    Past Surgical History:  Procedure Laterality Date  . ABI  11/2013   WNL  . APPENDECTOMY    . CARPAL TUNNEL RELEASE     Bilateral   . CATARACT EXTRACTION Bilateral 2006   Implants  . COLECTOMY  1998   For cancer  . COLONOSCOPY  2008   Dr.Stark, Due 2011  . COLONOSCOPY  06/2013   1 hyperplastic polyp, ileocecal anastomosis [facility])  . dexa  02/2012, 03/2014   T score -2.2 at hip overall stable  . REFRACTIVE SURGERY    . TONSILLECTOMY AND ADENOIDECTOMY    . TOTAL KNEE ARTHROPLASTY Left 12/17/2016   Procedure: LEFT TOTAL KNEE ARTHROPLASTY;  Surgeon: Gaynelle Arabian, MD;  Location: [facility];  Service: Orthopedics;  Laterality: Left;    There were no vitals filed for this visit.      Subjective Assessment - 01/07/17 1004    Subjective Patient and son report she was in the ED last night after a syncopal episode at the dinner table last night. Workup was largely negative,  and she was discharged home after fluids were provided. She reports feeling much better, her exercises have been going well.    Limitations Standing;Walking;House hold activities   Diagnostic tests X-rays confirming end stage arthritis    Patient Stated Goals To be able to walk with less pain.    Currently in Pain? No/denies      BP - 134/64 HR - 77  Knee extension - missing terminal 5 degrees still in supine, demonstrates roughly double that in gait   Prone knee hands with 9# of weights on leg (visibly able to get LE to near full extension) x 5 minutes   Extension to 3-4 degrees with bolster under heel   Steps x 3 bouts with HHA one step at a time as patient does not have adequate strength for alternating. She is able to go up steps with LLE, though difficult due to quadricep weakness.   Knee flexion mobilizations x 6 bouts x 30-45" per bout while sitting to increase knee flexion ROM, well tolerated  Flexion to 98 degrees in supine this date  Gait observation with RW on video and compared to last week, much improved swing phase and stride length, her knee extension is still lacking secondary to quadricep  weakness.                            PT Education - 01/07/17 1054    Education provided Yes   Education Details No exercises for today to allow any abnormal processes to manifest if they will before exercising again. Focus on knee flexion now.    Person(s) Educated Patient;Child(ren)   Methods Explanation;Demonstration;Handout   Comprehension Verbalized understanding;Returned demonstration             PT Long Term Goals - 12/22/16 1858      PT LONG TERM GOAL #1   Title Patient will report LEFS of greater than 40/80 to demonstrate improved tolerance for ADLs.    Time 12   Period Weeks   Status New     PT LONG TERM GOAL #2   Title Patient will ambulate 60m in less than 25" to demonstrate improved gait speed and reduced falls risk,    Time 12    Period Weeks   Status New     PT LONG TERM GOAL #3   Title Patient will TUG of less than 18" to demonstrate reduced falls risk with LRAD   Time 12   Period Weeks   Status New     PT LONG TERM GOAL #4   Title Patient will demonstrate less than 15 degrees of passive knee extension to demonstrate improved ability to ambulate efficiently.    Time 12   Period Weeks   Status New               Plan - 01/07/17 1007    Clinical Impression Statement Patient has made excellent progress with gait mechanics (now just lacking terminal knee extension otherwise within normal limits). Her knee extension ROM has improved tremendously and should normalize with low load long duration prone weight exercise provided today. Focus will now shift towards quadriceps strengthening to allow for use of SPC and ascending/descending steps.    Clinical Presentation Stable   Clinical Decision Making Moderate   Rehab Potential Fair   PT Frequency 2x / week   PT Duration 12 weeks   PT Treatment/Interventions Cryotherapy;Electrical Stimulation;Biofeedback;Moist Heat;Gait Scientist, forensic;Therapeutic activities;Therapeutic exercise;Balance training;Taping;Neuromuscular re-education;Patient/family education;Passive range of motion;DME Instruction   PT Next Visit Plan Work on extension ROM   PT Home Exercise Plan SLR, HS curlks, knee extension with towel roll underneath her knee    Consulted and Agree with Plan of Care Patient      Patient will benefit from skilled therapeutic intervention in order to improve the following deficits and impairments:  Abnormal gait, Pain, Decreased strength, Decreased range of motion, Decreased activity tolerance, Decreased balance, Decreased endurance, Difficulty walking, Decreased mobility, Decreased knowledge of use of DME  Visit Diagnosis: Status post left knee replacement  Chronic pain of left knee  Difficulty in walking, not elsewhere classified     Problem  List Patient Active Problem List   Diagnosis Date Noted  . OA (osteoarthritis) of knee 12/17/2016  . Pre-op evaluation 11/20/2016  . Left foot pain 07/20/2016  . Dry skin dermatitis 06/13/2016  . Health maintenance examination 01/30/2016  . Medicare annual wellness visit, subsequent 03/10/2014  . Advanced care planning/counseling discussion 03/10/2014  . PAD (peripheral artery disease) ([place]) 11/11/2013  . Dyspnea 09/18/2011  . PMR (polymyalgia rheumatica) ([place]) 08/27/2011  . Uncontrolled type 2 diabetes mellitus with nephropathy ([place]) 08/04/2009  . Essential hypertension 06/13/2009  . ANEMIA-NOS 01/24/2009  . CKD  stage 3 due to type 2 diabetes mellitus ([place]) 01/24/2009  . GLAUCOMA, CLOSED-ANGLE 11/13/2007  . Osteopenia 11/13/2007  . COLON CANCER, HX OF 11/13/2007  . HLD (hyperlipidemia) 10/23/2006   Royce Macadamia PT, DPT, CSCS    01/07/2017, 10:58 AM  [facility] [address], [place], [address] Phone: [phone]   Fax:  [phone]  Name: Taylor Castaneda MRN: [id number] Date of Birth: [birth year]

## 2017-01-07 NOTE — Discharge Instructions (Signed)
Fortunately today her blood work was reassuring and you did not have a heart attack. Please make an appointment to follow-up with your primary care physician in one week for reevaluation. Return to the emergency department sooner for any concerns.  It was a pleasure to take care of you today, and thank you for coming to our emergency department.  If you have any questions or concerns before leaving please ask the nurse to grab me and I'm more than happy to go through your aftercare instructions again.  If you were prescribed any opioid pain medication today such as Norco, Vicodin, Percocet, morphine, hydrocodone, or oxycodone please make sure you do not drive when you are taking this medication as it can alter your ability to drive safely.  If you have any concerns once you are home that you are not improving or are in fact getting worse before you can make it to your follow-up appointment, please do not hesitate to call [phone] and come back for further evaluation.  Darel Hong, MD  Results for orders placed or performed during the hospital encounter of 07/62/26  Basic metabolic panel  Result Value Ref Range   Sodium 140 135 - 145 mmol/L   Potassium 4.2 3.5 - 5.1 mmol/L   Chloride 111 101 - 111 mmol/L   CO2 22 22 - 32 mmol/L   Glucose, Bld 103 (H) 65 - 99 mg/dL   BUN 32 (H) 6 - 20 mg/dL   Creatinine, Ser 1.53 (H) 0.44 - 1.00 mg/dL   Calcium 8.3 (L) 8.9 - 10.3 mg/dL   GFR calc non Af Amer 31 (L) >60 mL/min   GFR calc Af Amer 35 (L) >60 mL/min   Anion gap 7 5 - 15  CBC  Result Value Ref Range   WBC 8.4 3.6 - 11.0 K/uL   RBC 3.05 (L) 3.80 - 5.20 MIL/uL   Hemoglobin 9.6 (L) 12.0 - 16.0 g/dL   HCT 28.7 (L) 35.0 - 47.0 %   MCV 93.8 80.0 - 100.0 fL   MCH 31.4 26.0 - 34.0 pg   MCHC 33.5 32.0 - 36.0 g/dL   RDW 14.6 (H) 11.5 - 14.5 %   Platelets 216 150 - 440 K/uL  Troponin I  Result Value Ref Range   Troponin I <0.03 <0.03 ng/mL  Troponin I  Result Value Ref Range   Troponin I <0.03  <0.03 ng/mL

## 2017-01-07 NOTE — ED Notes (Signed)
Pt ambulated up and down hallway with walker with steady gait noted. Pt denied any dizziness while walking.

## 2017-01-07 NOTE — Patient Instructions (Addendum)
BP - 134/64 HR - 77  Knee extension - missing terminal 5 degrees still in supine, demonstrates roughly double that in gait   Prone knee hands with 9# of weights on leg (visibly able to get LE to near full extension)  Extension to 3-4 degrees with bolster under heel

## 2017-01-07 NOTE — ED Provider Notes (Signed)
Care signed over from Dr. Archie Balboa pending second troponin. It is negative. No signs of cardiac ischemia. The patient feels comfortable going home with her daughter. Discharged home in improved condition.   Darel Hong, MD 01/07/17 [phone]

## 2017-01-09 ENCOUNTER — Ambulatory Visit: Payer: Medicare Other | Admitting: Physical Therapy

## 2017-01-09 DIAGNOSIS — G8929 Other chronic pain: Secondary | ICD-10-CM

## 2017-01-09 DIAGNOSIS — M25562 Pain in left knee: Secondary | ICD-10-CM

## 2017-01-09 DIAGNOSIS — R262 Difficulty in walking, not elsewhere classified: Secondary | ICD-10-CM

## 2017-01-09 DIAGNOSIS — Z96652 Presence of left artificial knee joint: Secondary | ICD-10-CM | POA: Diagnosis not present

## 2017-01-09 NOTE — Patient Instructions (Signed)
Gait observation   BP - 118/94 HR - 72  Knee lacking 5 degrees of extension to begin   BP 111/53  HR - 79  Total gym level 19 x 12, level 25 x 12 for 3 sets   Sit to stands x 7 with no HHA  (on 2nd set x 6 repetitions before fatigue) x 7 on the third set   Step ups to 1 riser x 6

## 2017-01-09 NOTE — Therapy (Signed)
[facility] [address], [place], [address] Phone: [phone]   Fax:  [phone]  Physical Therapy Treatment  Patient Details  Name: Taylor Castaneda MRN: [id number] Date of Birth: June 02, 1933 No Data Recorded  Encounter Date: 01/09/2017      PT End of Session - 01/09/17 1031    Visit Number 7   Number of Visits 25   Date for PT Re-Evaluation 03/15/17   PT Start Time 0945   PT Stop Time 1030   PT Time Calculation (min) 45 min   Equipment Utilized During Treatment Gait belt   Activity Tolerance Patient tolerated treatment well   Behavior During Therapy [facility] for tasks assessed/performed      Past Medical History:  Diagnosis Date  . Anemia   . Diabetes mellitus, type II ([place])   . Glaucoma    Dr.Hecker  . History of colon cancer 1998   Dr. Lennie Hummer  . Hyperlipemia   . Osteopenia 02/2012, 03/2014   DEXA hip -2.2  . Renal insufficiency   . Stenosing tenosynovitis of finger of left hand 2016   index - s/p steroid injection x2    Past Surgical History:  Procedure Laterality Date  . ABI  11/2013   WNL  . APPENDECTOMY    . CARPAL TUNNEL RELEASE     Bilateral   . CATARACT EXTRACTION Bilateral 2006   Implants  . COLECTOMY  1998   For cancer  . COLONOSCOPY  2008   Dr.Stark, Due 2011  . COLONOSCOPY  06/2013   1 hyperplastic polyp, ileocecal anastomosis [facility])  . dexa  02/2012, 03/2014   T score -2.2 at hip overall stable  . REFRACTIVE SURGERY    . TONSILLECTOMY AND ADENOIDECTOMY    . TOTAL KNEE ARTHROPLASTY Left 12/17/2016   Procedure: LEFT TOTAL KNEE ARTHROPLASTY;  Surgeon: Gaynelle Arabian, MD;  Location: [facility];  Service: Orthopedics;  Laterality: Left;    There were no vitals filed for this visit.      Subjective Assessment - 01/09/17 0948    Subjective Patient and son report sheis ambulating around the house much better, no further syncopal incidents. She does report she has been fatigued  from not sleeping well and would like to start sleeping on her side.    Limitations Standing;Walking;House hold activities   Diagnostic tests X-rays confirming end stage arthritis    Patient Stated Goals To be able to walk with less pain.    Currently in Pain? Other (Comment)  Patient reports some pain when she flexes her knee        Gait observation - excellent hip extension, roughly full knee extension at midstance and appropriate knee flexion in swing phase. She still lands more on plantar surface than her heel leading to slight knee flexion at "heel strike" however much improved from last week.  BP - 118/94 HR - 72  Knee lacking 5 degrees of extension to begin session (performed 5 bouts x 15" with bolster under her ankle of overpressure into extension to facilitate full knee extension   BP 111/53  HR - 79  Total gym level 19 x 12, level 25 x 12 for 3 sets   Sit to stands x 7 with no HHA  (on 2nd set x 6 repetitions before fatigue) x 7 on the third set   Step ups to 1 riser x 6 with bilateral HHA  PT Education - 01/09/17 1031    Education provided Yes   Education Details Keep working on getting out of the chair without use of UEs.    Person(s) Educated Patient;Child(ren)   Methods Explanation;Demonstration   Comprehension Verbalized understanding;Returned demonstration             PT Long Term Goals - 12/22/16 1858      PT LONG TERM GOAL #1   Title Patient will report LEFS of greater than 40/80 to demonstrate improved tolerance for ADLs.    Time 12   Period Weeks   Status New     PT LONG TERM GOAL #2   Title Patient will ambulate 18m in less than 25" to demonstrate improved gait speed and reduced falls risk,    Time 12   Period Weeks   Status New     PT LONG TERM GOAL #3   Title Patient will TUG of less than 18" to demonstrate reduced falls risk with LRAD   Time 12   Period Weeks   Status New     PT LONG TERM  GOAL #4   Title Patient will demonstrate less than 15 degrees of passive knee extension to demonstrate improved ability to ambulate efficiently.    Time 12   Period Weeks   Status New               Plan - 01/09/17 1032    Clinical Impression Statement Patient has made excellent progress with quadriceps strength (she is now able to transfer sit to stand without use of UEs) and is demonstrating near if not total knee extension in mid-stance. Her passive knee extension is still limited 3-5 degrees, but is being addressed in HEP.    Clinical Presentation Stable   Clinical Decision Making Moderate   Rehab Potential Fair   PT Frequency 2x / week   PT Duration 12 weeks   PT Treatment/Interventions Cryotherapy;Electrical Stimulation;Biofeedback;Moist Heat;Gait Scientist, forensic;Therapeutic activities;Therapeutic exercise;Balance training;Taping;Neuromuscular re-education;Patient/family education;Passive range of motion;DME Instruction   PT Next Visit Plan Work on extension ROM   PT Home Exercise Plan SLR, HS curlks, knee extension with towel roll underneath her knee    Consulted and Agree with Plan of Care Patient      Patient will benefit from skilled therapeutic intervention in order to improve the following deficits and impairments:  Abnormal gait, Pain, Decreased strength, Decreased range of motion, Decreased activity tolerance, Decreased balance, Decreased endurance, Difficulty walking, Decreased mobility, Decreased knowledge of use of DME  Visit Diagnosis: Status post left knee replacement  Chronic pain of left knee  Difficulty in walking, not elsewhere classified     Problem List Patient Active Problem List   Diagnosis Date Noted  . OA (osteoarthritis) of knee 12/17/2016  . Pre-op evaluation 11/20/2016  . Left foot pain 07/20/2016  . Dry skin dermatitis 06/13/2016  . Health maintenance examination 01/30/2016  . Medicare annual wellness visit, subsequent  03/10/2014  . Advanced care planning/counseling discussion 03/10/2014  . PAD (peripheral artery disease) ([place]) 11/11/2013  . Dyspnea 09/18/2011  . PMR (polymyalgia rheumatica) ([place]) 08/27/2011  . Uncontrolled type 2 diabetes mellitus with nephropathy ([place]) 08/04/2009  . Essential hypertension 06/13/2009  . ANEMIA-NOS 01/24/2009  . CKD stage 3 due to type 2 diabetes mellitus ([place]) 01/24/2009  . GLAUCOMA, CLOSED-ANGLE 11/13/2007  . Osteopenia 11/13/2007  . COLON CANCER, HX OF 11/13/2007  . HLD (hyperlipidemia) 10/23/2006   Royce Macadamia PT, DPT, CSCS    01/09/2017, 10:35 AM  [facility] [address] [place], [address] Phone: [phone]   Fax:  [phone]  Name: JASHAYLA GLATFELTER MRN: [id number] Date of Birth: 1932-06-06

## 2017-01-11 ENCOUNTER — Encounter: Payer: Medicare Other | Admitting: Occupational Therapy

## 2017-01-11 ENCOUNTER — Telehealth: Payer: Self-pay | Admitting: *Deleted

## 2017-01-11 NOTE — Telephone Encounter (Signed)
Patient daughter [name] notified

## 2017-01-11 NOTE — Telephone Encounter (Signed)
Let's hold ramipril over weekend. Thanks

## 2017-01-11 NOTE — Telephone Encounter (Signed)
Spoke to [facility], who states her mother has had "another little episode" yesterday am. She was prescribed 2 BP meds and [name] is wanting to know if they should hold one, or both of them until she is see on Monday. If she is not available, you are able to leave a detailed message with Dr Synthia Innocent advice. pls advise

## 2017-01-11 NOTE — Telephone Encounter (Signed)
pts daughter lisa shelton (dpr signed) lm at triage wanting to discuss pts BP meds. Attempted to contact lisa; lmom

## 2017-01-14 ENCOUNTER — Ambulatory Visit (INDEPENDENT_AMBULATORY_CARE_PROVIDER_SITE_OTHER): Payer: Medicare Other | Admitting: Family Medicine

## 2017-01-14 ENCOUNTER — Ambulatory Visit: Payer: Medicare Other | Admitting: Physical Therapy

## 2017-01-14 ENCOUNTER — Encounter: Payer: Self-pay | Admitting: Family Medicine

## 2017-01-14 VITALS — BP 148/62 | HR 73 | Temp 97.5°F | Wt 121.5 lb

## 2017-01-14 DIAGNOSIS — I10 Essential (primary) hypertension: Secondary | ICD-10-CM

## 2017-01-14 DIAGNOSIS — IMO0002 Reserved for concepts with insufficient information to code with codable children: Secondary | ICD-10-CM

## 2017-01-14 DIAGNOSIS — R262 Difficulty in walking, not elsewhere classified: Secondary | ICD-10-CM

## 2017-01-14 DIAGNOSIS — L89622 Pressure ulcer of left heel, stage 2: Secondary | ICD-10-CM | POA: Diagnosis not present

## 2017-01-14 DIAGNOSIS — N183 Chronic kidney disease, stage 3 unspecified: Secondary | ICD-10-CM

## 2017-01-14 DIAGNOSIS — E1121 Type 2 diabetes mellitus with diabetic nephropathy: Secondary | ICD-10-CM | POA: Diagnosis not present

## 2017-01-14 DIAGNOSIS — E1165 Type 2 diabetes mellitus with hyperglycemia: Secondary | ICD-10-CM

## 2017-01-14 DIAGNOSIS — M1712 Unilateral primary osteoarthritis, left knee: Secondary | ICD-10-CM

## 2017-01-14 DIAGNOSIS — R55 Syncope and collapse: Secondary | ICD-10-CM | POA: Diagnosis not present

## 2017-01-14 DIAGNOSIS — D62 Acute posthemorrhagic anemia: Secondary | ICD-10-CM

## 2017-01-14 DIAGNOSIS — Z96652 Presence of left artificial knee joint: Secondary | ICD-10-CM

## 2017-01-14 DIAGNOSIS — G8929 Other chronic pain: Secondary | ICD-10-CM

## 2017-01-14 DIAGNOSIS — E1122 Type 2 diabetes mellitus with diabetic chronic kidney disease: Secondary | ICD-10-CM

## 2017-01-14 DIAGNOSIS — M25562 Pain in left knee: Secondary | ICD-10-CM

## 2017-01-14 MED ORDER — FUROSEMIDE 40 MG PO TABS: 20.0000 mg | ORAL_TABLET | Freq: Every day | ORAL | 1 refills | Status: DC

## 2017-01-14 NOTE — Therapy (Signed)
[facility] [address], [place], [address] Phone: [phone]   Fax:  [phone]  Physical Therapy Treatment  Patient Details  Name: Taylor Castaneda MRN: [id number] Date of Birth: 01-Jan-1933 No Data Recorded  Encounter Date: 01/14/2017      PT End of Session - 01/14/17 1005    Visit Number 8   Number of Visits 25   Date for PT Re-Evaluation 03/15/17   PT Start Time 0920   PT Stop Time 0959   PT Time Calculation (min) 39 min   Equipment Utilized During Treatment Gait belt   Activity Tolerance Patient tolerated treatment well   Behavior During Therapy [facility] for tasks assessed/performed      Past Medical History:  Diagnosis Date  . Anemia   . Diabetes mellitus, type II ([place])   . Glaucoma    Dr.Hecker  . History of colon cancer 1998   Dr. Lennie Hummer  . Hyperlipemia   . Osteopenia 02/2012, 03/2014   DEXA hip -2.2  . Renal insufficiency   . Stenosing tenosynovitis of finger of left hand 2016   index - s/p steroid injection x2    Past Surgical History:  Procedure Laterality Date  . ABI  11/2013   WNL  . APPENDECTOMY    . CARPAL TUNNEL RELEASE     Bilateral   . CATARACT EXTRACTION Bilateral 2006   Implants  . COLECTOMY  1998   For cancer  . COLONOSCOPY  2008   Dr.Stark, Due 2011  . COLONOSCOPY  06/2013   1 hyperplastic polyp, ileocecal anastomosis [facility])  . dexa  02/2012, 03/2014   T score -2.2 at hip overall stable  . REFRACTIVE SURGERY    . TONSILLECTOMY AND ADENOIDECTOMY    . TOTAL KNEE ARTHROPLASTY Left 12/17/2016   Procedure: LEFT TOTAL KNEE ARTHROPLASTY;  Surgeon: Gaynelle Arabian, MD;  [facility];  Service: Orthopedics;  Laterality: Left;    There were no vitals filed for this visit.      Subjective Assessment - 01/14/17 0926    Subjective Patient reports she has continued to keep up with her HEP and has been ambulating around the house with a SPC and no loss of balance.     Limitations Standing;Walking;House hold activities   Diagnostic tests X-rays confirming end stage arthritis    Patient Stated Goals To be able to walk with less pain.    Currently in Pain? Other (Comment)  Very mild pain in the knee she reports.      Knee motion  7-102 degrees in supine   Total gym Level 22 squats bilateral x 12 repetitions, level 24 x 12, level 25 x 12 for 2 sets (felt a little light headed standing up)  BP - 147/43 HR -75  A-P joint mobs -- notable for swelling still in distal tibial area x 5 repetitions x 30" grade III mobilizations in maximal tolerated knee flexion position (to 103 degrees after)   Knee extension joint mobs with towel roll underneath her calcaneus and quad set/PT overpressure x 3 minutes)-- to 5 degrees after completion   Leg Press - 45# x12, 55# x12, 65# x10 repetitions   Step ups x 10 on LLE only to 2 blocks with step with bilateral HHA                             PT Education - 01/14/17 1004  Education provided Yes   Education Details Emphasize low load long duration knee flexion and extension.    Person(s) Educated Patient;Child(ren)   Methods Explanation;Demonstration   Comprehension Verbalized understanding;Returned demonstration             PT Long Term Goals - 12/22/16 1858      PT LONG TERM GOAL #1   Title Patient will report LEFS of greater than 40/80 to demonstrate improved tolerance for ADLs.    Time 12   Period Weeks   Status New     PT LONG TERM GOAL #2   Title Patient will ambulate 17m in less than 25" to demonstrate improved gait speed and reduced falls risk,    Time 12   Period Weeks   Status New     PT LONG TERM GOAL #3   Title Patient will TUG of less than 18" to demonstrate reduced falls risk with LRAD   Time 12   Period Weeks   Status New     PT LONG TERM GOAL #4   Title Patient will demonstrate less than 15 degrees of passive knee extension to demonstrate improved ability to  ambulate efficiently.    Time 12   Period Weeks   Status New               Plan - 01/14/17 1005    Clinical Impression Statement Patient continues to make excellent progress with quadricep strength, though her knee ROM is roughly unchaged from previous visit. Discussed with patient and son to switch to low load long duration stretching in both flexion and extension. Her balance in gait is much improved, and she is now appropriate for short to mid distances with a cane.    Clinical Presentation Stable   Clinical Decision Making Low   Rehab Potential Fair   PT Frequency 2x / week   PT Duration 12 weeks   PT Treatment/Interventions Cryotherapy;Electrical Stimulation;Biofeedback;Moist Heat;Gait Scientist, forensic;Therapeutic activities;Therapeutic exercise;Balance training;Taping;Neuromuscular re-education;Patient/family education;Passive range of motion;DME Instruction   PT Next Visit Plan Work on extension ROM   PT Home Exercise Plan SLR, HS curlks, knee extension with towel roll underneath her knee    Consulted and Agree with Plan of Care Patient      Patient will benefit from skilled therapeutic intervention in order to improve the following deficits and impairments:  Abnormal gait, Pain, Decreased strength, Decreased range of motion, Decreased activity tolerance, Decreased balance, Decreased endurance, Difficulty walking, Decreased mobility, Decreased knowledge of use of DME  Visit Diagnosis: Status post left knee replacement  Chronic pain of left knee  Difficulty in walking, not elsewhere classified     Problem List Patient Active Problem List   Diagnosis Date Noted  . OA (osteoarthritis) of knee 12/17/2016  . Pre-op evaluation 11/20/2016  . Left foot pain 07/20/2016  . Dry skin dermatitis 06/13/2016  . Health maintenance examination 01/30/2016  . Medicare annual wellness visit, subsequent 03/10/2014  . Advanced care planning/counseling discussion 03/10/2014   . PAD (peripheral artery disease) ([place]) 11/11/2013  . Dyspnea 09/18/2011  . PMR (polymyalgia rheumatica) ([place]) 08/27/2011  . Uncontrolled type 2 diabetes mellitus with nephropathy ([place]) 08/04/2009  . Essential hypertension 06/13/2009  . ANEMIA-NOS 01/24/2009  . CKD stage 3 due to type 2 diabetes mellitus ([place]) 01/24/2009  . GLAUCOMA, CLOSED-ANGLE 11/13/2007  . Osteopenia 11/13/2007  . COLON CANCER, HX OF 11/13/2007  . HLD (hyperlipidemia) 10/23/2006    Royce Macadamia PT, DPT, CSCS    01/14/2017, 10:10 AM  [facility] [address] [place], [address] Phone: [phone]   Fax:  [phone]  Name: Taylor Castaneda MRN: [id number] Date of Birth: Dec 20, 1932

## 2017-01-14 NOTE — Patient Instructions (Addendum)
Knee motion  7-102 degrees in supine   Total gym   BP - 147/43 HR -75  A-P joint mobs  Knee extension joint mobs  Leg Press - 45# x12, 55# x12, 65#   Step ups x 10 on LLE only to 2 blocks with step with bilateral HHA

## 2017-01-14 NOTE — Progress Notes (Signed)
BP (!) 148/62   Pulse 73   Temp (!) 97.5 F (36.4 C) (Oral)   Wt 121 lb 8 oz (55.1 kg)   SpO2 97%   BMI 23.73 kg/m    CC: ER f/u visit Subjective:    Patient ID: Taylor Castaneda, female    DOB: [birth year], 83 y.o.   MRN: [id number]  HPI: Taylor Castaneda is a 83 y.o. female presenting on 01/14/2017 for Hospitalization Follow-up   Patient was seen at ER last week after syncopal episode at home. EMS called, bp dropped to 60 systolic. Treated with IVF x1. This was in setting of 2 tramadol (new med). Records reviewed. Labs showed mild renal insufficiency with Cr 1.29 --> 1.53 (GFR 31) and Hgb 9.6. TnI neg x2.   Brings bp log - 108-148/60-80.   Over weekend we stopped ramipril due to persistent mild dizziness. She notes dizziness with home exercises.  Would like heel ulcer evaluated - obtained while recovering from recent L knee replacement.   Relevant past medical, surgical, family and social history reviewed and updated as indicated. Interim medical history since our last visit reviewed. Allergies and medications reviewed and updated. Outpatient Medications Prior to Visit  Medication Sig Dispense Refill  . atorvastatin (LIPITOR) 40 MG tablet TAKE ONE-HALF TABLET BY MOUTH EVERY DAY AT 6PM (Patient taking differently: TAKE ONE-HALF TABLET(20 MG)  BY MOUTH EVERY DAY AT NIGHT) 45 tablet 1  . B-D ULTRAFINE III SHORT PEN 31G X 8 MM MISC USE AS DIRECTED WITH INSULIN 100 each 3  . Carboxymethylcellul-Glycerin (CLEAR EYES FOR DRY EYES) 1-0.25 % SOLN Place 1-2 drops into both eyes 3 (three) times daily as needed (for dry/irritated eyes.).    Marland Kitchen carvedilol (COREG) 25 MG tablet Take 12.5 mg by mouth 2 (two) times daily.  2  . glucose blood (FREESTYLE LITE) test strip Use as instructed (Patient taking differently: 1 each by Other route 4 (four) times daily. Use as instructed) 100 each 2  . HUMALOG KWIKPEN 100 UNIT/ML KiwkPen Inject 10 Units into the skin 3 (three) times daily before meals.  4  .  KLOR-CON M10 10 MEQ tablet TAKE ONE TABLET BY MOUTH ON MONDAY, WEDNESDAY, AND FRIDAY (Patient taking differently: TAKE ONE TABLET BY MOUTH ON MONDAY, WEDNESDAY, AND FRIDAY IN THE MORNING) 36 tablet 3  . LANTUS SOLOSTAR 100 UNIT/ML Solostar Pen Inject 35 Units into the skin at bedtime.  3  . furosemide (LASIX) 40 MG tablet TAKE ONE TABLET BY MOUTH ONCE DAILY 90 tablet 0  . oxyCODONE (OXY IR/ROXICODONE) 5 MG immediate release tablet Take 1-3 tablets (5-15 mg total) by mouth every 3 (three) hours as needed for breakthrough pain. 80 tablet 0  . rivaroxaban (XARELTO) 10 MG TABS tablet Take 1 tablet (10 mg total) by mouth daily with breakfast. 20 tablet 0  . tiZANidine (ZANAFLEX) 4 MG capsule Take 1 capsule (4 mg total) by mouth 3 (three) times daily as needed for muscle spasms. 60 capsule 0  . traMADol (ULTRAM) 50 MG tablet Take 1-2 tablets (50-100 mg total) by mouth every 6 (six) hours as needed for moderate pain. 60 tablet 0  . ramipril (ALTACE) 10 MG capsule TAKE 1 CAPSULE BY MOUTH ONCE DAILY (Patient not taking: Reported on 01/14/2017) 90 capsule 1   No facility-administered medications prior to visit.      Per HPI unless specifically indicated in ROS section below Review of Systems     Objective:    BP (!) 148/62  Pulse 73   Temp (!) 97.5 F (36.4 C) (Oral)   Wt 121 lb 8 oz (55.1 kg)   SpO2 97%   BMI 23.73 kg/m   Wt Readings from Last 3 Encounters:  01/14/17 121 lb 8 oz (55.1 kg)  12/17/16 128 lb (58.1 kg)  12/11/16 128 lb 8 oz (58.3 kg)    Physical Exam  Constitutional: She appears well-developed and well-nourished. No distress.  HENT:  Mouth/Throat: Oropharynx is clear and moist. No oropharyngeal exudate.  Cardiovascular: Normal rate, regular rhythm, normal heart sounds and intact distal pulses.   No murmur heard. Pulmonary/Chest: Effort normal and breath sounds normal. No respiratory distress. She has no wheezes. She has no rales.  Musculoskeletal: She exhibits no edema.  L  knee with dressings c/d/i L lateral lower leg with patch of skin desquamation above ankle L heel with pressure ulcer with clean base without erythema  Skin: Skin is warm and dry. No rash noted.  Psychiatric: She has a normal mood and affect.  Nursing note and vitals reviewed.  Results for orders placed or performed during the hospital encounter of 62/95/28  Basic metabolic panel  Result Value Ref Range   Sodium 140 135 - 145 mmol/L   Potassium 4.2 3.5 - 5.1 mmol/L   Chloride 111 101 - 111 mmol/L   CO2 22 22 - 32 mmol/L   Glucose, Bld 103 (H) 65 - 99 mg/dL   BUN 32 (H) 6 - 20 mg/dL   Creatinine, Ser 1.53 (H) 0.44 - 1.00 mg/dL   Calcium 8.3 (L) 8.9 - 10.3 mg/dL   GFR calc non Af Amer 31 (L) >60 mL/min   GFR calc Af Amer 35 (L) >60 mL/min   Anion gap 7 5 - 15  CBC  Result Value Ref Range   WBC 8.4 3.6 - 11.0 K/uL   RBC 3.05 (L) 3.80 - 5.20 MIL/uL   Hemoglobin 9.6 (L) 12.0 - 16.0 g/dL   HCT 28.7 (L) 35.0 - 47.0 %   MCV 93.8 80.0 - 100.0 fL   MCH 31.4 26.0 - 34.0 pg   MCHC 33.5 32.0 - 36.0 g/dL   RDW 14.6 (H) 11.5 - 14.5 %   Platelets 216 150 - 440 K/uL  Troponin I  Result Value Ref Range   Troponin I <0.03 <0.03 ng/mL  Troponin I  Result Value Ref Range   Troponin I <0.03 <0.03 ng/mL      Assessment & Plan:   Problem List Items Addressed This Visit    CKD stage 3 due to type 2 diabetes mellitus (HCC)    Update Cr (after recent bump) and CBC (after recent surgery).      Relevant Orders   Renal function panel   CBC with Differential/Platelet   Essential hypertension    Ramipril on hold. She continues lasix 1/2 tab of 40mg  daily and carvedilol 12.5mg  bid. Will await labs then decide on antihypertensive regimen.       Relevant Medications   furosemide (LASIX) 40 MG tablet   OA (osteoarthritis) of knee    S/p recent L knee replacement, recovering well.       Postoperative anemia due to acute blood loss    Update CBC. rec start iron tablet daily.       Pressure  ulcer of left heel, stage 2    Wound care reviewed, discussed importance of avoiding pressure on area. Wound cleaned today and dressed with hydrocolloid dressing. Materials provided to pt/daughter. Discussed importance of nutrition.  Discussed update if not improving for [facility] wound referral. Pt and daughter agree with plan.       Syncope - Primary    With markedly low blood pressures recently. Ramipril on hold. Encouraged good hydration status. Update labs today then determine antihypertensive regimen.      Relevant Medications   furosemide (LASIX) 40 MG tablet   Uncontrolled type 2 diabetes mellitus with nephropathy ([place])    Endorses overall good control recently          Follow up plan: Return if symptoms worsen or fail to improve.  [name], MD

## 2017-01-14 NOTE — Patient Instructions (Addendum)
Labs today.  Keep an eye on pressure sore left heel - let me know if not healing.  Continue current medicines for now (off ramipril) until we receive labwork results.  Make sure to get good water intake and proteins with every meal (glucerna).  Try to get good iron rich foods or consider iron pill daily.

## 2017-01-15 DIAGNOSIS — R55 Syncope and collapse: Secondary | ICD-10-CM | POA: Insufficient documentation

## 2017-01-15 DIAGNOSIS — L89622 Pressure ulcer of left heel, stage 2: Secondary | ICD-10-CM | POA: Insufficient documentation

## 2017-01-15 LAB — RENAL FUNCTION PANEL
ALBUMIN: 4 g/dL (ref 3.5–5.2)
BUN: 32 mg/dL — ABNORMAL HIGH (ref 6–23)
CALCIUM: 9.4 mg/dL (ref 8.4–10.5)
CHLORIDE: 103 meq/L (ref 96–112)
CO2: 24 meq/L (ref 19–32)
Creatinine, Ser: 1.36 mg/dL — ABNORMAL HIGH (ref 0.40–1.20)
GFR: 39.51 mL/min — AB (ref >60.00)
GLUCOSE: 195 mg/dL — AB (ref 70–99)
POTASSIUM: 4.9 meq/L (ref 3.5–5.1)
Phosphorus: 3.9 mg/dL (ref 2.3–4.6)
Sodium: 138 mEq/L (ref 135–145)

## 2017-01-15 LAB — CBC WITH DIFFERENTIAL/PLATELET
BASOS PCT: 1.1 % (ref 0.0–3.0)
Basophils Absolute: 0.1 10*3/uL (ref 0.0–0.1)
EOS PCT: 3.8 % (ref 0.0–5.0)
Eosinophils Absolute: 0.3 10*3/uL (ref 0.0–0.7)
HCT: 35.1 % — ABNORMAL LOW (ref 36.0–46.0)
Hemoglobin: 11.5 g/dL — ABNORMAL LOW (ref 12.0–15.0)
LYMPHS ABS: 2.5 10*3/uL (ref 0.7–4.0)
Lymphocytes Relative: 29.4 % (ref 12.0–46.0)
MCHC: 32.7 g/dL (ref 30.0–36.0)
MCV: 95.9 fl (ref 78.0–100.0)
MONO ABS: 1.1 10*3/uL — AB (ref 0.1–1.0)
MONOS PCT: 13.1 % — AB (ref 3.0–12.0)
NEUTROS ABS: 4.4 10*3/uL (ref 1.4–7.7)
NEUTROS PCT: 52.6 % (ref 43.0–77.0)
Platelets: 276 10*3/uL (ref 150.0–400.0)
RBC: 3.66 Mil/uL — AB (ref 3.87–5.11)
RDW: 15.3 % (ref 11.5–15.5)
WBC: 8.3 10*3/uL (ref 4.0–10.5)

## 2017-01-15 NOTE — Assessment & Plan Note (Signed)
Update Cr (after recent bump) and CBC (after recent surgery).

## 2017-01-15 NOTE — Assessment & Plan Note (Signed)
Endorses overall good control recently

## 2017-01-15 NOTE — Assessment & Plan Note (Signed)
With markedly low blood pressures recently. Ramipril on hold. Encouraged good hydration status. Update labs today then determine antihypertensive regimen.

## 2017-01-15 NOTE — Assessment & Plan Note (Signed)
Ramipril on hold. She continues lasix 1/2 tab of 40mg  daily and carvedilol 12.5mg  bid. Will await labs then decide on antihypertensive regimen.

## 2017-01-15 NOTE — Assessment & Plan Note (Addendum)
S/p recent L knee replacement, recovering well.

## 2017-01-15 NOTE — Assessment & Plan Note (Signed)
Wound care reviewed, discussed importance of avoiding pressure on area. Wound cleaned today and dressed with hydrocolloid dressing. Materials provided to pt/daughter. Discussed importance of nutrition. Discussed update if not improving for [facility] wound referral. Pt and daughter agree with plan.

## 2017-01-15 NOTE — Assessment & Plan Note (Signed)
Update CBC. rec start iron tablet daily.

## 2017-01-16 ENCOUNTER — Ambulatory Visit: Payer: Medicare Other | Admitting: Physical Therapy

## 2017-01-16 DIAGNOSIS — Z96652 Presence of left artificial knee joint: Secondary | ICD-10-CM | POA: Diagnosis not present

## 2017-01-16 DIAGNOSIS — G8929 Other chronic pain: Secondary | ICD-10-CM

## 2017-01-16 DIAGNOSIS — R262 Difficulty in walking, not elsewhere classified: Secondary | ICD-10-CM

## 2017-01-16 DIAGNOSIS — M25562 Pain in left knee: Secondary | ICD-10-CM

## 2017-01-16 NOTE — Patient Instructions (Signed)
Prolonged low load long duration knee flexion stretch in supine with belt for overpressure (up to 104 degrees)   Prolonged low load long duration knee extension with towel roll under heel and 5# weight under heel with PT overpressure   Step ups to 1 riser x 15, 2 risers x 8, x 12 repetitions   Full squats x 15 with bilateral HHA to 90 degrees x 2 sets

## 2017-01-16 NOTE — Therapy (Signed)
[facility] [address], [place], [address] Phone: [phone]   Fax:  [phone]  Physical Therapy Treatment  Patient Details  Name: Taylor Castaneda MRN: [id number] Date of Birth: 1932-12-22 No Data Recorded  Encounter Date: 01/16/2017      PT End of Session - 01/16/17 1253    Visit Number 9   Number of Visits 25   Date for PT Re-Evaluation 03/15/17   PT Start Time 0954   PT Stop Time 1033   PT Time Calculation (min) 39 min   Equipment Utilized During Treatment Gait belt   Activity Tolerance Patient tolerated treatment well   Behavior During Therapy [facility] for tasks assessed/performed      Past Medical History:  Diagnosis Date  . Anemia   . Diabetes mellitus, type II ([place])   . Glaucoma    Dr.Hecker  . History of colon cancer 1998   Dr. Lennie Hummer  . Hyperlipemia   . Osteopenia 02/2012, 03/2014   DEXA hip -2.2  . Renal insufficiency   . Stenosing tenosynovitis of finger of left hand 2016   index - s/p steroid injection x2    Past Surgical History:  Procedure Laterality Date  . ABI  11/2013   WNL  . APPENDECTOMY    . CARPAL TUNNEL RELEASE     Bilateral   . CATARACT EXTRACTION Bilateral 2006   Implants  . COLECTOMY  1998   For cancer  . COLONOSCOPY  2008   Dr.Stark, Due 2011  . COLONOSCOPY  06/2013   1 hyperplastic polyp, ileocecal anastomosis [facility] Plan)  . dexa  02/2012, 03/2014   T score -2.2 at hip overall stable  . REFRACTIVE SURGERY    . TONSILLECTOMY AND ADENOIDECTOMY    . TOTAL KNEE ARTHROPLASTY Left 12/17/2016   Procedure: LEFT TOTAL KNEE ARTHROPLASTY;  Surgeon: Gaynelle Arabian, MD;  Location: [facility];  Service: Orthopedics;  Laterality: Left;    There were no vitals filed for this visit.      Subjective Assessment - 01/16/17 1002    Subjective Patient reports she has been diligent with her HEP, she is due to see Dr. Maureen Ralphs next Tuesday.    Limitations Standing;Walking;House hold  activities   Diagnostic tests X-rays confirming end stage arthritis    Patient Stated Goals To be able to walk with less pain.    Currently in Pain? No/denies        Prolonged low load long duration knee flexion stretch in supine with belt for overpressure (up to 104 degrees)   Prolonged low load long duration knee extension with towel roll under heel and 5# weight under heel with PT overpressure (down to 4-7 degrees of extension still). Observed gait and noted increased knee flexion to advance her operative limb, she still misses terminal knee extension in heel strike.   Step ups to 1 riser x 15, 2 risers x 8, x 12 repetitions   Full squats x 15 with bilateral HHA to 90 degrees x 2 sets                           PT Education - 01/16/17 1243    Education provided Yes   Education Details Continue with knee flexion and extension ROM exercises to maximize ROM at this point.    Person(s) Educated Patient;Child(ren)   Methods Explanation;Demonstration   Comprehension Verbalized understanding;Returned demonstration  PT Long Term Goals - 12/22/16 1858      PT LONG TERM GOAL #1   Title Patient will report LEFS of greater than 40/80 to demonstrate improved tolerance for ADLs.    Time 12   Period Weeks   Status New     PT LONG TERM GOAL #2   Title Patient will ambulate 51m in less than 25" to demonstrate improved gait speed and reduced falls risk,    Time 12   Period Weeks   Status New     PT LONG TERM GOAL #3   Title Patient will TUG of less than 18" to demonstrate reduced falls risk with LRAD   Time 12   Period Weeks   Status New     PT LONG TERM GOAL #4   Title Patient will demonstrate less than 15 degrees of passive knee extension to demonstrate improved ability to ambulate efficiently.    Time 12   Period Weeks   Status New               Plan - 01/16/17 1253    Clinical Impression Statement Patient presents with [facility], she is  now ambulating short community distances with [facility] without loss of balance or substantial increase in pain. She still appears "stiff" lacking knee flexion to advance her LLE, as well as lacking terminal knee extension still. Her quadricep strength has improved significantly and she is not demonstrating any flexion collapse at midstance.    Rehab Potential Fair   PT Frequency 2x / week   PT Duration 12 weeks   PT Treatment/Interventions Cryotherapy;Electrical Stimulation;Biofeedback;Moist Heat;Gait Scientist, forensic;Therapeutic activities;Therapeutic exercise;Balance training;Taping;Neuromuscular re-education;Patient/family education;Passive range of motion;DME Instruction   PT Next Visit Plan Work on extension ROM   PT Home Exercise Plan SLR, HS curlks, knee extension with towel roll underneath her knee    Consulted and Agree with Plan of Care Patient      Patient will benefit from skilled therapeutic intervention in order to improve the following deficits and impairments:  Abnormal gait, Pain, Decreased strength, Decreased range of motion, Decreased activity tolerance, Decreased balance, Decreased endurance, Difficulty walking, Decreased mobility, Decreased knowledge of use of DME  Visit Diagnosis: Status post left knee replacement  Difficulty in walking, not elsewhere classified  Chronic pain of left knee     Problem List Patient Active Problem List   Diagnosis Date Noted  . Pressure ulcer of left heel, stage 2 01/15/2017  . Syncope 01/15/2017  . OA (osteoarthritis) of knee 12/17/2016  . Pre-op evaluation 11/20/2016  . Dry skin dermatitis 06/13/2016  . Health maintenance examination 01/30/2016  . Medicare annual wellness visit, subsequent 03/10/2014  . Advanced care planning/counseling discussion 03/10/2014  . PAD (peripheral artery disease) ([place]) 11/11/2013  . Dyspnea 09/18/2011  . PMR (polymyalgia rheumatica) ([place]) 08/27/2011  . Uncontrolled type 2 diabetes mellitus with  nephropathy ([place]) 08/04/2009  . Essential hypertension 06/13/2009  . Postoperative anemia due to acute blood loss 01/24/2009  . CKD stage 3 due to type 2 diabetes mellitus ([place]) 01/24/2009  . GLAUCOMA, CLOSED-ANGLE 11/13/2007  . Osteopenia 11/13/2007  . COLON CANCER, HX OF 11/13/2007  . HLD (hyperlipidemia) 10/23/2006   Royce Macadamia PT, DPT, CSCS     01/16/2017, 12:59 PM  [facility] [address], [place], [address] Phone: [phone]   Fax:  [phone]  Name: Taylor Castaneda MRN: [id number] Date of Birth: [birth year]

## 2017-01-21 ENCOUNTER — Ambulatory Visit: Payer: Medicare Other | Admitting: Physical Therapy

## 2017-01-21 DIAGNOSIS — Z96652 Presence of left artificial knee joint: Secondary | ICD-10-CM

## 2017-01-21 DIAGNOSIS — M25562 Pain in left knee: Secondary | ICD-10-CM | POA: Diagnosis not present

## 2017-01-21 DIAGNOSIS — G8929 Other chronic pain: Secondary | ICD-10-CM | POA: Diagnosis not present

## 2017-01-21 DIAGNOSIS — R262 Difficulty in walking, not elsewhere classified: Secondary | ICD-10-CM

## 2017-01-21 NOTE — Patient Instructions (Signed)
11.35 seconds (TUG without device)  5x sit to stand with use of UEs -- 10.76 seconds  5x sit to stand without use of UEs - 12.18 seconds

## 2017-01-21 NOTE — Therapy (Signed)
[facility] [address], [place], [address] Phone: [phone]   Fax:  [phone]  Physical Therapy Treatment  Patient Details  Name: Taylor Castaneda MRN: [id number] Date of Birth: 15-Feb-1933 No Data Recorded  Encounter Date: 01/21/2017      PT End of Session - 01/21/17 1034    Visit Number 10   Number of Visits 25   Date for PT Re-Evaluation 03/15/17   PT Start Time 0947   PT Stop Time 1027   PT Time Calculation (min) 40 min   Equipment Utilized During Treatment Gait belt   Activity Tolerance Patient tolerated treatment well   Behavior During Therapy [facility] for tasks assessed/performed      Past Medical History:  Diagnosis Date  . Anemia   . Diabetes mellitus, type II ([place])   . Glaucoma    Dr.Hecker  . History of colon cancer 1998   Dr. Lennie Hummer  . Hyperlipemia   . Osteopenia 02/2012, 03/2014   DEXA hip -2.2  . Renal insufficiency   . Stenosing tenosynovitis of finger of left hand 2016   index - s/p steroid injection x2    Past Surgical History:  Procedure Laterality Date  . ABI  11/2013   WNL  . APPENDECTOMY    . CARPAL TUNNEL RELEASE     Bilateral   . CATARACT EXTRACTION Bilateral 2006   Implants  . COLECTOMY  1998   For cancer  . COLONOSCOPY  2008   Dr.Stark, Due 2011  . COLONOSCOPY  06/2013   1 hyperplastic polyp, ileocecal anastomosis [facility] Plan)  . dexa  02/2012, 03/2014   T score -2.2 at hip overall stable  . REFRACTIVE SURGERY    . TONSILLECTOMY AND ADENOIDECTOMY    . TOTAL KNEE ARTHROPLASTY Left 12/17/2016   Procedure: LEFT TOTAL KNEE ARTHROPLASTY;  Surgeon: Gaynelle Arabian, MD;  [facility];  Service: Orthopedics;  Laterality: Left;    There were no vitals filed for this visit.      Subjective Assessment - 01/21/17 0948    Subjective Patient reports she has been able to ambulate in the house without her cane, feels good about her balance.    Limitations  Standing;Walking;House hold activities   Diagnostic tests X-rays confirming end stage arthritis    Patient Stated Goals To be able to walk with less pain.    Currently in Pain? No/denies      11.35 seconds (TUG without device)  5x sit to stand with use of UEs -- 10.76 seconds  5x sit to stand without use of UEs - 12.18 seconds  FGA 1. 3 2. 3 3. 3 4. 3 5. 3 6. 2 7. 0 8. 1 9. 2 10. 2  22/30  Knee ROM 3-110   Gait analysis -- noted to still have "stiff" (decreased knee flexion in gait) appearance in her gait, terminal knee extension is still lacking, however much improved. Mild lateral sway as she has a functional limb length inequality, however no loss of balance noted.   Step ups to next step to emphasize knee flexion in gait x15 for 2 sets with bilateral HHA   45m walk - 11.32 seconds with no AD                           PT Education - 01/21/17 1033    Education provided Yes   Education Details Her ROM has improved  significantly, she is still lacking some extension.    Person(s) Educated Patient;Child(ren)   Methods Explanation;Demonstration   Comprehension Verbalized understanding;Returned demonstration             PT Long Term Goals - February 04, 2017 1036      PT LONG TERM GOAL #1   Title Patient will report LEFS of greater than 40/80 to demonstrate improved tolerance for ADLs.    Time 12   Period Weeks   Status On-going     PT LONG TERM GOAL #2   Title Patient will ambulate 35m in less than 25" to demonstrate improved gait speed and reduced falls risk,    Time 12   Period Weeks   Status Achieved     PT LONG TERM GOAL #3   Title Patient will TUG of less than 18" to demonstrate reduced falls risk with LRAD   Time 12   Period Weeks   Status Achieved     PT LONG TERM GOAL #4   Title Patient will demonstrate less than 15 degrees of passive knee extension to demonstrate improved ability to ambulate efficiently.    Time 12   Period  Weeks   Status Achieved               Plan - 04-Feb-2017 1035    Clinical Impression Statement Patient demonstrates significant improvement in balance, LE strength, LE power measures this date. She is demonstrating improving knee ROM (3-110 degrees), though she continues to lack full knee extension which reduces her dynamic balance in gait. She will see her MD this week and likely be dc'd from therapy after next visit.    Clinical Presentation Stable   Clinical Decision Making Low   Rehab Potential Fair   PT Frequency 2x / week   PT Duration 12 weeks   PT Treatment/Interventions Cryotherapy;Electrical Stimulation;Biofeedback;Moist Heat;Gait Scientist, forensic;Therapeutic activities;Therapeutic exercise;Balance training;Taping;Neuromuscular re-education;Patient/family education;Passive range of motion;DME Instruction   PT Next Visit Plan Work on extension ROM   PT Home Exercise Plan SLR, HS curlks, knee extension with towel roll underneath her knee    Consulted and Agree with Plan of Care Patient      Patient will benefit from skilled therapeutic intervention in order to improve the following deficits and impairments:  Abnormal gait, Pain, Decreased strength, Decreased range of motion, Decreased activity tolerance, Decreased balance, Decreased endurance, Difficulty walking, Decreased mobility, Decreased knowledge of use of DME  Visit Diagnosis: Status post left knee replacement  Chronic pain of left knee  Difficulty in walking, not elsewhere classified       G-Codes - Feb 04, 2017 1037    Functional Assessment Tool Used (Outpatient Only) 45m walk, TUG, LEFS, 5x sit to stand    Functional Limitation Mobility: Walking and moving around   Mobility: Walking and Moving Around Current Status [phone]) At least 1 percent but less than 20 percent impaired, limited or restricted   Mobility: Walking and Moving Around Goal Status [phone]) At least 1 percent but less than 20 percent impaired,  limited or restricted      Problem List Patient Active Problem List   Diagnosis Date Noted  . Pressure ulcer of left heel, stage 2 01/15/2017  . Syncope 01/15/2017  . OA (osteoarthritis) of knee 12/17/2016  . Pre-op evaluation 11/20/2016  . Dry skin dermatitis 06/13/2016  . Health maintenance examination 01/30/2016  . Medicare annual wellness visit, subsequent 03/10/2014  . Advanced care planning/counseling discussion 03/10/2014  . PAD (peripheral artery disease) ([place]) 11/11/2013  .  Dyspnea 09/18/2011  . PMR (polymyalgia rheumatica) ([place]) 08/27/2011  . Uncontrolled type 2 diabetes mellitus with nephropathy ([place]) 08/04/2009  . Essential hypertension 06/13/2009  . Postoperative anemia due to acute blood loss 01/24/2009  . CKD stage 3 due to type 2 diabetes mellitus ([place]) 01/24/2009  . GLAUCOMA, CLOSED-ANGLE 11/13/2007  . Osteopenia 11/13/2007  . COLON CANCER, HX OF 11/13/2007  . HLD (hyperlipidemia) 10/23/2006   Royce Macadamia PT, DPT, CSCS     01/21/2017, 10:37 AM  [facility] [address] [place], [address] Phone: [phone]   Fax:  [phone]  Name: ALORA GOREY MRN: [id number] Date of Birth: 1933/05/15

## 2017-01-22 DIAGNOSIS — Z96652 Presence of left artificial knee joint: Secondary | ICD-10-CM | POA: Diagnosis not present

## 2017-01-22 DIAGNOSIS — M7552 Bursitis of left shoulder: Secondary | ICD-10-CM | POA: Diagnosis not present

## 2017-01-22 DIAGNOSIS — Z471 Aftercare following joint replacement surgery: Secondary | ICD-10-CM | POA: Diagnosis not present

## 2017-01-23 ENCOUNTER — Encounter: Payer: Medicare Other | Admitting: Physical Therapy

## 2017-01-24 ENCOUNTER — Ambulatory Visit: Payer: Medicare Other | Admitting: Physical Therapy

## 2017-01-28 ENCOUNTER — Encounter: Payer: Medicare Other | Admitting: Physical Therapy

## 2017-01-30 ENCOUNTER — Encounter: Payer: Medicare Other | Admitting: Physical Therapy

## 2017-01-31 ENCOUNTER — Encounter: Payer: Medicare Other | Admitting: Physical Therapy

## 2017-02-01 DIAGNOSIS — E119 Type 2 diabetes mellitus without complications: Secondary | ICD-10-CM | POA: Diagnosis not present

## 2017-02-01 DIAGNOSIS — H402232 Chronic angle-closure glaucoma, bilateral, moderate stage: Secondary | ICD-10-CM | POA: Diagnosis not present

## 2017-02-01 DIAGNOSIS — H35033 Hypertensive retinopathy, bilateral: Secondary | ICD-10-CM | POA: Diagnosis not present

## 2017-02-01 LAB — HM DIABETES EYE EXAM

## 2017-02-06 ENCOUNTER — Ambulatory Visit: Payer: Medicare Other | Attending: Orthopedic Surgery | Admitting: Physical Therapy

## 2017-02-06 DIAGNOSIS — R262 Difficulty in walking, not elsewhere classified: Secondary | ICD-10-CM

## 2017-02-06 DIAGNOSIS — M25562 Pain in left knee: Secondary | ICD-10-CM | POA: Diagnosis not present

## 2017-02-06 DIAGNOSIS — Z96652 Presence of left artificial knee joint: Secondary | ICD-10-CM | POA: Diagnosis not present

## 2017-02-06 DIAGNOSIS — G8929 Other chronic pain: Secondary | ICD-10-CM

## 2017-02-06 NOTE — Therapy (Signed)
[place] [address], [place], [address] Phone: [phone]   Fax:  [phone]  Physical Therapy Treatment  Patient Details  Name: Taylor Castaneda MRN: [id number] Date of Birth: [birth year] No Data Recorded  Encounter Date: 02/06/2017      PT End of Session - 02/06/17 2217    Visit Number 11   Number of Visits 25   Date for PT Re-Evaluation 03/15/17   PT Start Time 0945   PT Stop Time 1015   PT Time Calculation (min) 30 min   Equipment Utilized During Treatment --   Activity Tolerance Patient tolerated treatment well   Behavior During Therapy [facility] for tasks assessed/performed      Past Medical History:  Diagnosis Date  . Anemia   . Diabetes mellitus, type II ([place])   . Glaucoma    Dr.Hecker  . History of colon cancer 1998   Dr. Lennie Hummer  . Hyperlipemia   . Osteopenia 02/2012, 03/2014   DEXA hip -2.2  . Renal insufficiency   . Stenosing tenosynovitis of finger of left hand 2016   index - s/p steroid injection x2    Past Surgical History:  Procedure Laterality Date  . ABI  11/2013   WNL  . APPENDECTOMY    . CARPAL TUNNEL RELEASE     Bilateral   . CATARACT EXTRACTION Bilateral 2006   Implants  . COLECTOMY  1998   For cancer  . COLONOSCOPY  2008   Dr.Stark, Due 2011  . COLONOSCOPY  06/2013   1 hyperplastic polyp, ileocecal anastomosis [facility] Plan)  . dexa  02/2012, 03/2014   T score -2.2 at hip overall stable  . REFRACTIVE SURGERY    . TONSILLECTOMY AND ADENOIDECTOMY    . TOTAL KNEE ARTHROPLASTY Left 12/17/2016   Procedure: LEFT TOTAL KNEE ARTHROPLASTY;  Surgeon: Gaynelle Arabian, MD;  [facility]: [facility];  Service: Orthopedics;  Laterality: Left;    There were no vitals filed for this visit.      Subjective Assessment - 02/06/17 2215    Subjective Patient and son report she is doing quite well and has been ambulating outside the house without her cane. Report she has been compliant with all  of her exercises.    Limitations Standing;Walking;House hold activities   Diagnostic tests X-rays confirming end stage arthritis    Patient Stated Goals To be able to walk with less pain.    Currently in Pain? No/denies      Assessed gait pattern without device- no lateral deviation or sway noted. She is noted to still lack terminal knee extension at heel strike on her LLE, otherwise symmetrical gait pattern with respect to temporo-spatial kinematics.   Assessed knee extension ROM in supine able to get to 5-7 degrees from full extension on LLE  She is able to perform sit to stands without using her hands  Patient and son asked PT to condense exercises. Selected exercises to address common deficit of quadricep strength and knee flexion ROM in TKA post-op patients. Included sit to stands, step ups, mini squats,and seated self knee flexion.                            PT Education - 02/06/17 2216    Education provided Yes   Education Details Revised HEP and discussed discharge plans.    Person(s) Educated Patient;Child(ren)   Methods Explanation;Demonstration   Comprehension Verbalized understanding;Returned  demonstration             PT Long Term Goals - 01/21/17 1036      PT LONG TERM GOAL #1   Title Patient will report LEFS of greater than 40/80 to demonstrate improved tolerance for ADLs.    Time 12   Period Weeks   Status On-going     PT LONG TERM GOAL #2   Title Patient will ambulate 56m in less than 25" to demonstrate improved gait speed and reduced falls risk,    Time 12   Period Weeks   Status Achieved     PT LONG TERM GOAL #3   Title Patient will TUG of less than 18" to demonstrate reduced falls risk with LRAD   Time 12   Period Weeks   Status Achieved     PT LONG TERM GOAL #4   Title Patient will demonstrate less than 15 degrees of passive knee extension to demonstrate improved ability to ambulate efficiently.    Time 12   Period Weeks    Status Achieved               Plan - 02/06/17 2217    Clinical Impression Statement Patient shows considerable improvement in balance and independence with mobility. She does still lack terminal knee extension in gait, though given the interventions and her adherence at home this is likely more anatomical. She is quite satisfied with her progress and safety with ambulation. She is appropriate for discharge.    Clinical Presentation Stable   Clinical Decision Making Low   Rehab Potential Fair   PT Frequency 2x / week   PT Duration 12 weeks   PT Treatment/Interventions Cryotherapy;Electrical Stimulation;Biofeedback;Moist Heat;Gait Scientist, forensic;Therapeutic activities;Therapeutic exercise;Balance training;Taping;Neuromuscular re-education;Patient/family education;Passive range of motion;DME Instruction   PT Next Visit Plan Work on extension ROM   PT Home Exercise Plan SLR, HS curlks, knee extension with towel roll underneath her knee    Consulted and Agree with Plan of Care Patient      Patient will benefit from skilled therapeutic intervention in order to improve the following deficits and impairments:  Abnormal gait, Pain, Decreased strength, Decreased range of motion, Decreased activity tolerance, Decreased balance, Decreased endurance, Difficulty walking, Decreased mobility, Decreased knowledge of use of DME  Visit Diagnosis: Status post left knee replacement  Difficulty in walking, not elsewhere classified  Chronic pain of left knee     Problem List Patient Active Problem List   Diagnosis Date Noted  . Pressure ulcer of left heel, stage 2 01/15/2017  . Syncope 01/15/2017  . OA (osteoarthritis) of knee 12/17/2016  . Pre-op evaluation 11/20/2016  . Dry skin dermatitis 06/13/2016  . Health maintenance examination 01/30/2016  . Medicare annual wellness visit, subsequent 03/10/2014  . Advanced care planning/counseling discussion 03/10/2014  . PAD (peripheral  artery disease) ([place]) 11/11/2013  . Dyspnea 09/18/2011  . PMR (polymyalgia rheumatica) ([place]) 08/27/2011  . Uncontrolled type 2 diabetes mellitus with nephropathy ([place]) 08/04/2009  . Essential hypertension 06/13/2009  . Postoperative anemia due to acute blood loss 01/24/2009  . CKD stage 3 due to type 2 diabetes mellitus ([place]) 01/24/2009  . GLAUCOMA, CLOSED-ANGLE 11/13/2007  . Osteopenia 11/13/2007  . COLON CANCER, HX OF 11/13/2007  . HLD (hyperlipidemia) 10/23/2006   Royce Macadamia PT, DPT, CSCS    02/06/2017, 10:19 PM  [facility] [address] [place], [address] Phone: [phone]   Fax:  [phone]  Name:  Taylor Castaneda MRN: [id number] Date of Birth: Mar 14, 1933

## 2017-02-07 ENCOUNTER — Ambulatory Visit (INDEPENDENT_AMBULATORY_CARE_PROVIDER_SITE_OTHER): Payer: Medicare Other

## 2017-02-07 ENCOUNTER — Other Ambulatory Visit: Payer: Self-pay | Admitting: Family Medicine

## 2017-02-07 VITALS — BP 140/70 | HR 73 | Temp 98.0°F | Ht 59.5 in | Wt 122.5 lb

## 2017-02-07 DIAGNOSIS — E1121 Type 2 diabetes mellitus with diabetic nephropathy: Secondary | ICD-10-CM

## 2017-02-07 DIAGNOSIS — IMO0002 Reserved for concepts with insufficient information to code with codable children: Secondary | ICD-10-CM

## 2017-02-07 DIAGNOSIS — E785 Hyperlipidemia, unspecified: Secondary | ICD-10-CM | POA: Diagnosis not present

## 2017-02-07 DIAGNOSIS — Z0000 Encounter for general adult medical examination without abnormal findings: Secondary | ICD-10-CM | POA: Diagnosis not present

## 2017-02-07 DIAGNOSIS — Z23 Encounter for immunization: Secondary | ICD-10-CM

## 2017-02-07 DIAGNOSIS — E1165 Type 2 diabetes mellitus with hyperglycemia: Secondary | ICD-10-CM

## 2017-02-07 LAB — LIPID PANEL
CHOLESTEROL: 145 mg/dL (ref 0–200)
HDL: 31.7 mg/dL — AB (ref >39.00)
NonHDL: 113.13
TRIGLYCERIDES: 228 mg/dL — AB (ref 0.0–149.0)
Total CHOL/HDL Ratio: 5
VLDL: 45.6 mg/dL — AB (ref 0.0–40.0)

## 2017-02-07 LAB — BASIC METABOLIC PANEL
BUN: 22 mg/dL (ref 6–23)
CALCIUM: 9.6 mg/dL (ref 8.4–10.5)
CO2: 24 meq/L (ref 19–32)
CREATININE: 1.14 mg/dL (ref 0.40–1.20)
Chloride: 108 mEq/L (ref 96–112)
GFR: 48.43 mL/min — ABNORMAL LOW (ref >60.00)
GLUCOSE: 141 mg/dL — AB (ref 70–99)
Potassium: 4.6 mEq/L (ref 3.5–5.1)
Sodium: 141 mEq/L (ref 135–145)

## 2017-02-07 LAB — HEMOGLOBIN A1C: Hgb A1c MFr Bld: 6.2 % (ref 4.6–6.5)

## 2017-02-07 LAB — LDL CHOLESTEROL, DIRECT: LDL DIRECT: 72 mg/dL

## 2017-02-07 NOTE — Patient Instructions (Signed)
Taylor Castaneda , Thank you for taking time to come for your Medicare Wellness Visit. I appreciate your ongoing commitment to your health goals. Please review the following plan we discussed and let me know if I can assist you in the future.   These are the goals we discussed: Goals    . Increase physical activity          Starting 02/07/2017, I will continue to do physical therapy exercises for 30 min daily.        This is a list of the screening recommended for you and due dates:  Health Maintenance  Topic Date Due  . DTaP/Tdap/Td vaccine (1 - Tdap) 02/08/2027*  . Tetanus Vaccine  02/08/2027*  . Hemoglobin A1C  06/13/2017  . Complete foot exam   08/01/2017  . Eye exam for diabetics  01/16/2018  . Flu Shot  Completed  . DEXA scan (bone density measurement)  Completed  . Pneumonia vaccines  Completed  *Topic was postponed. The date shown is not the original due date.   Preventive Care for Adults  A healthy lifestyle and preventive care can promote health and wellness. Preventive health guidelines for adults include the following key practices.  . A routine yearly physical is a good way to check with your health care provider about your health and preventive screening. It is a chance to share any concerns and updates on your health and to receive a thorough exam.  . Visit your dentist for a routine exam and preventive care every 6 months. Brush your teeth twice a day and floss once a day. Good oral hygiene prevents tooth decay and gum disease.  . The frequency of eye exams is based on your age, health, family medical history, use  of contact lenses, and other factors. Follow your health care provider's ecommendations for frequency of eye exams.  . Eat a healthy diet. Foods like vegetables, fruits, whole grains, low-fat dairy products, and lean protein foods contain the nutrients you need without too many calories. Decrease your intake of foods high in solid fats, added sugars, and salt.  Eat the right amount of calories for you. Get information about a proper diet from your health care provider, if necessary.  . Regular physical exercise is one of the most important things you can do for your health. Most adults should get at least 150 minutes of moderate-intensity exercise (any activity that increases your heart rate and causes you to sweat) each week. In addition, most adults need muscle-strengthening exercises on 2 or more days a week.  Silver Sneakers may be a benefit available to you. To determine eligibility, you may visit the website: www.silversneakers.com or contact program at (407)682-6108 Mon-Fri between 8AM-8PM.   . Maintain a healthy weight. The body mass index (BMI) is a screening tool to identify possible weight problems. It provides an estimate of body fat based on height and weight. Your health care provider can find your BMI and can help you achieve or maintain a healthy weight.   For adults 20 years and older: ? A BMI below 18.5 is considered underweight. ? A BMI of 18.5 to 24.9 is normal. ? A BMI of 25 to 29.9 is considered overweight. ? A BMI of 30 and above is considered obese.   . Maintain normal blood lipids and cholesterol levels by exercising and minimizing your intake of saturated fat. Eat a balanced diet with plenty of fruit and vegetables. Blood tests for lipids and cholesterol should begin at  age 59 and be repeated every 5 years. If your lipid or cholesterol levels are high, you are over 50, or you are at high risk for heart disease, you may need your cholesterol levels checked more frequently. Ongoing high lipid and cholesterol levels should be treated with medicines if diet and exercise are not working.  . If you smoke, find out from your health care provider how to quit. If you do not use tobacco, please do not start.  . If you choose to drink alcohol, please do not consume more than 2 drinks per day. One drink is considered to be 12 ounces (355  mL) of beer, 5 ounces (148 mL) of wine, or 1.5 ounces (44 mL) of liquor.  . If you are 63-37 years old, ask your health care provider if you should take aspirin to prevent strokes.  . Use sunscreen. Apply sunscreen liberally and repeatedly throughout the day. You should seek shade when your shadow is shorter than you. Protect yourself by wearing long sleeves, pants, a wide-brimmed hat, and sunglasses year round, whenever you are outdoors.  . Once a month, do a whole body skin exam, using a mirror to look at the skin on your back. Tell your health care provider of new moles, moles that have irregular borders, moles that are larger than a pencil eraser, or moles that have changed in shape or color.

## 2017-02-07 NOTE — Progress Notes (Signed)
PCP notes:   Health maintenance:  Flu vaccine - administered Tetanus - postponed/insurance A1C - completed Eye exam - per pt, exam in August 2018  Abnormal screenings:   Hearing - failed Fall risk - hx of fall with injury and medical treatment Depression score: 1  Patient concerns:   None  Nurse concerns:  None  Next PCP appt:   02/14/17 @ 1030

## 2017-02-07 NOTE — Progress Notes (Signed)
Pre visit review using our clinic review tool, if applicable. No additional management support is needed unless otherwise documented below in the visit note. 

## 2017-02-07 NOTE — Progress Notes (Signed)
Subjective:   Taylor Castaneda is a 83 y.o. female who presents for Medicare Annual (Subsequent) preventive examination.  Review of Systems: N/A Cardiac Risk Factors include: advanced age (>43men, >58 women);diabetes mellitus;hypertension;dyslipidemia     Objective:     Vitals: BP 140/70 (BP Location: Right Arm, Patient Position: Sitting, Cuff Size: Normal)   Pulse 73   Temp 98 F (36.7 C) (Oral)   Ht 4' 11.5" (1.511 m) Comment: no shoes  Wt 122 lb 8 oz (55.6 kg)   SpO2 99%   BMI 24.33 kg/m   Body mass index is 24.33 kg/m.   Tobacco History  Smoking Status  . Former Smoker  . Years: 2.00  . Types: Cigarettes  . Quit date: 06/04/1973  Smokeless Tobacco  . Never Used    Comment: Smoked for 5 years 1965-1970 up to 1/2 pp week     Counseling given: No   Past Medical History:  Diagnosis Date  . Anemia   . Diabetes mellitus, type II ([place])   . Glaucoma    Dr.Hecker  . History of colon cancer 1998   Dr. Lennie Hummer  . Hyperlipemia   . Osteopenia 02/2012, 03/2014   DEXA hip -2.2  . Renal insufficiency   . Stenosing tenosynovitis of finger of left hand 2016   index - s/p steroid injection x2   Past Surgical History:  Procedure Laterality Date  . ABI  11/2013   WNL  . APPENDECTOMY    . CARPAL TUNNEL RELEASE     Bilateral   . CATARACT EXTRACTION Bilateral 2006   Implants  . COLECTOMY  1998   For cancer  . COLONOSCOPY  2008   Dr.Stark, Due 2011  . COLONOSCOPY  06/2013   1 hyperplastic polyp, ileocecal anastomosis [facility])  . dexa  02/2012, 03/2014   T score -2.2 at hip overall stable  . REFRACTIVE SURGERY    . TONSILLECTOMY AND ADENOIDECTOMY    . TOTAL KNEE ARTHROPLASTY Left 12/17/2016   Procedure: LEFT TOTAL KNEE ARTHROPLASTY;  Surgeon: Gaynelle Arabian, MD;  Location: [facility];  Service: Orthopedics;  Laterality: Left;   Family History  Problem Relation Age of Onset  . Heart attack Father 29  . Stroke Father 2  . Diabetes Father   . Hypertension Father   .  Heart disease Father        before age 45  . Breast cancer Mother   . Cancer Mother   . Diabetes Sister   . Cancer Sister   . Peripheral vascular disease Sister   . Breast cancer Sister   . Hypertension Son   . COPD Neg Hx   . Asthma Neg Hx   . Colon cancer Neg Hx   . Esophageal cancer Neg Hx   . Rectal cancer Neg Hx   . Stomach cancer Neg Hx    History  Sexual Activity  . Sexual activity: No    Outpatient Encounter Prescriptions as of 02/07/2017  Medication Sig  . atorvastatin (LIPITOR) 40 MG tablet TAKE ONE-HALF TABLET BY MOUTH EVERY DAY AT 6PM (Patient taking differently: TAKE ONE-HALF TABLET(20 MG)  BY MOUTH EVERY DAY AT NIGHT)  . B-D ULTRAFINE III SHORT PEN 31G X 8 MM MISC USE AS DIRECTED WITH INSULIN  . Carboxymethylcellul-Glycerin (CLEAR EYES FOR DRY EYES) 1-0.25 % SOLN Place 1-2 drops into both eyes 3 (three) times daily as needed (for dry/irritated eyes.).  Marland Kitchen carvedilol (COREG) 25 MG tablet Take 12.5 mg by mouth 2 (two) times  daily.  . furosemide (LASIX) 40 MG tablet Take 0.5 tablets (20 mg total) by mouth daily.  Marland Kitchen glucose blood (FREESTYLE LITE) test strip Use as instructed (Patient taking differently: 1 each by Other route 4 (four) times daily. Use as instructed)  . HUMALOG KWIKPEN 100 UNIT/ML KiwkPen Inject 10 Units into the skin 3 (three) times daily before meals.  Marland Kitchen KLOR-CON M10 10 MEQ tablet TAKE ONE TABLET BY MOUTH ON MONDAY, WEDNESDAY, AND FRIDAY (Patient taking differently: TAKE ONE TABLET BY MOUTH ON MONDAY, WEDNESDAY, AND FRIDAY IN THE MORNING)  . LANTUS SOLOSTAR 100 UNIT/ML Solostar Pen Inject 35 Units into the skin at bedtime.  . [DISCONTINUED] ramipril (ALTACE) 10 MG capsule TAKE 1 CAPSULE BY MOUTH ONCE DAILY (Patient not taking: Reported on 01/14/2017)   No facility-administered encounter medications on file as of 02/07/2017.     Activities of Daily Living In your present state of health, do you have any difficulty performing the following activities: 02/07/2017  12/17/2016  Hearing? N N  Vision? N N  Difficulty concentrating or making decisions? N N  Walking or climbing stairs? Y Y  Dressing or bathing? N N  Doing errands, shopping? N N  Preparing Food and eating ? N -  Using the Toilet? N -  In the past six months, have you accidently leaked urine? N -  Do you have problems with loss of bowel control? N -  Managing your Medications? N -  Managing your Finances? N -  Housekeeping or managing your Housekeeping? N -  Some recent data might be hidden    Patient Care Team: [name], MD as PCP - General (Family Medicine) Jacelyn Pi, MD as Consulting Physician (Endocrinology)    Assessment:     Hearing Screening   125Hz  250Hz  500Hz  1000Hz  2000Hz  3000Hz  4000Hz  6000Hz  8000Hz   Right ear:   40 0 40  40    Left ear:   0 0 40  0    Vision Screening Comments: Last vision exam in August 2018 with Dr. Herbert Deaner   Exercise Activities and Dietary recommendations Current Exercise Habits: Home exercise routine, Type of exercise: Other - see comments (therapy exercises), Time (Minutes): 30, Frequency (Times/Week): 7, Weekly Exercise (Minutes/Week): 210, Intensity: Mild, Exercise limited by: orthopedic condition(s)  Goals    . Increase physical activity          Starting 02/07/2017, I will continue to do physical therapy exercises for 30 min daily.       Fall Risk Fall Risk  02/07/2017 01/30/2016 03/10/2014  Falls in the past year? Yes No No  Comment pt reports she blacked out and fell causing bruising to left foot - -  Number falls in past yr: 1 - -  Injury with Fall? Yes - -   Depression Screen PHQ 2/9 Scores 02/07/2017 01/30/2016 03/10/2014  PHQ - 2 Score 0 0 0  PHQ- 9 Score 1 - -     Cognitive Function MMSE - Mini Mental State Exam 02/07/2017  Orientation to time 5  Orientation to Place 5  Registration 3  Attention/ Calculation 0  Recall 3  Language- name 2 objects 0  Language- repeat 1  Language- follow 3 step command 3    Language- read & follow direction 0  Write a sentence 0  Copy design 0  Total score 20     PLEASE NOTE: A Mini-Cog screen was completed. Maximum score is 20. A value of 0 denotes this part of [place] MMSE was not  completed or the patient failed this part of the Mini-Cog screening.   Mini-Cog Screening Orientation to Time - Max 5 pts Orientation to Place - Max 5 pts Registration - Max 3 pts Recall - Max 3 pts Language Repeat - Max 1 pts Language Follow 3 Step Command - Max 3 pts     Immunization History  Administered Date(s) Administered  . Influenza Split 03/16/2011, 03/12/2012  . Influenza Whole 06/04/2002, 04/24/2007, 03/10/2008, 03/31/2009, 04/18/2010  . Influenza, High Dose Seasonal PF 03/18/2013  . Influenza,inj,Quad PF,6+ Mos 03/10/2014, 01/30/2016, 02/07/2017  . Pneumococcal Conjugate-13 03/10/2014  . Pneumococcal Polysaccharide-23 03/23/2011  . Td 06/08/1996  . Zoster 06/05/2007   Screening Tests Health Maintenance  Topic Date Due  . DTaP/Tdap/Td (1 - Tdap) 02/08/2027 (Originally 06/09/1996)  . TETANUS/TDAP  02/08/2027 (Originally 06/08/2006)  . HEMOGLOBIN A1C  06/13/2017  . FOOT EXAM  08/01/2017  . OPHTHALMOLOGY EXAM  01/16/2018  . INFLUENZA VACCINE  Completed  . DEXA SCAN  Completed  . PNA vac Low Risk Adult  Completed      Plan:     I have personally reviewed and addressed the Medicare Annual Wellness questionnaire and have noted the following in the patient's chart:  A. Medical and social history B. Use of alcohol, tobacco or illicit drugs  C. Current medications and supplements D. Functional ability and status E.  Nutritional status F.  Physical activity G. Advance directives H. List of other physicians I.  Hospitalizations, surgeries, and ER visits in previous 12 months J.  [place] to include hearing, vision, cognitive, depression L. Referrals and appointments - none  In addition, I have reviewed and discussed with patient certain  preventive protocols, quality metrics, and best practice recommendations. A written personalized care plan for preventive services as well as general preventive health recommendations were provided to patient.  See attached scanned questionnaire for additional information.   Signed,   Lindell Noe, MHA, BS, LPN Health Coach'

## 2017-02-08 ENCOUNTER — Encounter: Payer: Medicare Other | Admitting: Physical Therapy

## 2017-02-08 DIAGNOSIS — E1165 Type 2 diabetes mellitus with hyperglycemia: Secondary | ICD-10-CM | POA: Diagnosis not present

## 2017-02-11 ENCOUNTER — Encounter: Payer: Medicare Other | Admitting: Physical Therapy

## 2017-02-11 ENCOUNTER — Encounter: Payer: Self-pay | Admitting: Family Medicine

## 2017-02-13 ENCOUNTER — Encounter: Payer: Medicare Other | Admitting: Physical Therapy

## 2017-02-14 ENCOUNTER — Encounter: Payer: Medicare Other | Admitting: Physical Therapy

## 2017-02-14 ENCOUNTER — Ambulatory Visit (INDEPENDENT_AMBULATORY_CARE_PROVIDER_SITE_OTHER): Payer: Medicare Other | Admitting: Family Medicine

## 2017-02-14 ENCOUNTER — Encounter: Payer: Self-pay | Admitting: Family Medicine

## 2017-02-14 VITALS — BP 138/60 | HR 65 | Temp 97.8°F | Ht 60.0 in | Wt 122.5 lb

## 2017-02-14 DIAGNOSIS — R55 Syncope and collapse: Secondary | ICD-10-CM

## 2017-02-14 DIAGNOSIS — N183 Chronic kidney disease, stage 3 unspecified: Secondary | ICD-10-CM

## 2017-02-14 DIAGNOSIS — IMO0002 Reserved for concepts with insufficient information to code with codable children: Secondary | ICD-10-CM

## 2017-02-14 DIAGNOSIS — I10 Essential (primary) hypertension: Secondary | ICD-10-CM | POA: Diagnosis not present

## 2017-02-14 DIAGNOSIS — E1165 Type 2 diabetes mellitus with hyperglycemia: Secondary | ICD-10-CM

## 2017-02-14 DIAGNOSIS — E1122 Type 2 diabetes mellitus with diabetic chronic kidney disease: Secondary | ICD-10-CM

## 2017-02-14 DIAGNOSIS — Z0000 Encounter for general adult medical examination without abnormal findings: Secondary | ICD-10-CM | POA: Diagnosis not present

## 2017-02-14 DIAGNOSIS — E1121 Type 2 diabetes mellitus with diabetic nephropathy: Secondary | ICD-10-CM

## 2017-02-14 DIAGNOSIS — M1712 Unilateral primary osteoarthritis, left knee: Secondary | ICD-10-CM

## 2017-02-14 DIAGNOSIS — Z7189 Other specified counseling: Secondary | ICD-10-CM | POA: Diagnosis not present

## 2017-02-14 DIAGNOSIS — L89622 Pressure ulcer of left heel, stage 2: Secondary | ICD-10-CM

## 2017-02-14 DIAGNOSIS — E785 Hyperlipidemia, unspecified: Secondary | ICD-10-CM | POA: Diagnosis not present

## 2017-02-14 NOTE — Assessment & Plan Note (Signed)
Chronic, stable on lasix 20mg  daily and carvedilol 12.5mg  bid. rec continue to hold ramipril for now. Consider addition of low dose in future if Cr stays stable.

## 2017-02-14 NOTE — Assessment & Plan Note (Signed)
Cr stable to slightly improved. Continue to monitor.

## 2017-02-14 NOTE — Patient Instructions (Addendum)
If interested, check with pharmacy about new 2 shot shingles series (shingrix).  Check at home on health care power of attorney form (provided today)  Sometimes check sugars 2 hours after a meal instead of right before. Goal 120-180 after a meal. Keep appointment next week with Dr Chalmers Cater.  You are doing well today. Return as needed or in 6 months for follow up visit.   Health Maintenance, Female Adopting a healthy lifestyle and getting preventive care can go a long way to promote health and wellness. Talk with your health care provider about what schedule of regular examinations is right for you. This is a good chance for you to check in with your provider about disease prevention and staying healthy. In between checkups, there are plenty of things you can do on your own. Experts have done a lot of research about which lifestyle changes and preventive measures are most likely to keep you healthy. Ask your health care provider for more information. Weight and diet Eat a healthy diet  Be sure to include plenty of vegetables, fruits, low-fat dairy products, and lean protein.  Do not eat a lot of foods high in solid fats, added sugars, or salt.  Get regular exercise. This is one of the most important things you can do for your health. ? Most adults should exercise for at least 150 minutes each week. The exercise should increase your heart rate and make you sweat (moderate-intensity exercise). ? Most adults should also do strengthening exercises at least twice a week. This is in addition to the moderate-intensity exercise.  Maintain a healthy weight  Body mass index (BMI) is a measurement that can be used to identify possible weight problems. It estimates body fat based on height and weight. Your health care provider can help determine your BMI and help you achieve or maintain a healthy weight.  For females 74 years of age and older: ? A BMI below 18.5 is considered underweight. ? A BMI of 18.5 to  24.9 is normal. ? A BMI of 25 to 29.9 is considered overweight. ? A BMI of 30 and above is considered obese.  Watch levels of cholesterol and blood lipids  You should start having your blood tested for lipids and cholesterol at 81 years of age, then have this test every 5 years.  You may need to have your cholesterol levels checked more often if: ? Your lipid or cholesterol levels are high. ? You are older than 81 years of age. ? You are at high risk for heart disease.  Cancer screening Lung Cancer  Lung cancer screening is recommended for adults 45-14 years old who are at high risk for lung cancer because of a history of smoking.  A yearly low-dose CT scan of the lungs is recommended for people who: ? Currently smoke. ? Have quit within the past 15 years. ? Have at least a 30-pack-year history of smoking. A pack year is smoking an average of one pack of cigarettes a day for 1 year.  Yearly screening should continue until it has been 15 years since you quit.  Yearly screening should stop if you develop a health problem that would prevent you from having lung cancer treatment.  Breast Cancer  Practice breast self-awareness. This means understanding how your breasts normally appear and feel.  It also means doing regular breast self-exams. Let your health care provider know about any changes, no matter how small.  If you are in your 20s or 30s, you  should have a clinical breast exam (CBE) by a health care provider every 1-3 years as part of a regular health exam.  If you are 57 or older, have a CBE every year. Also consider having a breast X-ray (mammogram) every year.  If you have a family history of breast cancer, talk to your health care provider about genetic screening.  If you are at high risk for breast cancer, talk to your health care provider about having an MRI and a mammogram every year.  Breast cancer gene (BRCA) assessment is recommended for women who have family  members with BRCA-related cancers. BRCA-related cancers include: ? Breast. ? Ovarian. ? Tubal. ? Peritoneal cancers.  Results of the assessment will determine the need for genetic counseling and BRCA1 and BRCA2 testing.  Cervical Cancer Your health care provider may recommend that you be screened regularly for cancer of the pelvic organs (ovaries, uterus, and vagina). This screening involves a pelvic examination, including checking for microscopic changes to the surface of your cervix (Pap test). You may be encouraged to have this screening done every 3 years, beginning at age 29.  For women ages 57-65, health care providers may recommend pelvic exams and Pap testing every 3 years, or they may recommend the Pap and pelvic exam, combined with testing for human papilloma virus (HPV), every 5 years. Some types of HPV increase your risk of cervical cancer. Testing for HPV may also be done on women of any age with unclear Pap test results.  Other health care providers may not recommend any screening for nonpregnant women who are considered low risk for pelvic cancer and who do not have symptoms. Ask your health care provider if a screening pelvic exam is right for you.  If you have had past treatment for cervical cancer or a condition that could lead to cancer, you need Pap tests and screening for cancer for at least 20 years after your treatment. If Pap tests have been discontinued, your risk factors (such as having a new sexual partner) need to be reassessed to determine if screening should resume. Some women have medical problems that increase the chance of getting cervical cancer. In these cases, your health care provider may recommend more frequent screening and Pap tests.  Colorectal Cancer  This type of cancer can be detected and often prevented.  Routine colorectal cancer screening usually begins at 81 years of age and continues through 81 years of age.  Your health care provider may  recommend screening at an earlier age if you have risk factors for colon cancer.  Your health care provider may also recommend using home test kits to check for hidden blood in the stool.  A small camera at the end of a tube can be used to examine your colon directly (sigmoidoscopy or colonoscopy). This is done to check for the earliest forms of colorectal cancer.  Routine screening usually begins at age 26.  Direct examination of the colon should be repeated every 5-10 years through 81 years of age. However, you may need to be screened more often if early forms of precancerous polyps or small growths are found.  Skin Cancer  Check your skin from head to toe regularly.  Tell your health care provider about any new moles or changes in moles, especially if there is a change in a mole's shape or color.  Also tell your health care provider if you have a mole that is larger than the size of a pencil eraser.  Always use sunscreen. Apply sunscreen liberally and repeatedly throughout the day.  Protect yourself by wearing long sleeves, pants, a wide-brimmed hat, and sunglasses whenever you are outside.  Heart disease, diabetes, and high blood pressure  High blood pressure causes heart disease and increases the risk of stroke. High blood pressure is more likely to develop in: ? People who have blood pressure in the high end of the normal range (130-139/85-89 mm Hg). ? People who are overweight or obese. ? People who are African American.  If you are 70-28 years of age, have your blood pressure checked every 3-5 years. If you are 22 years of age or older, have your blood pressure checked every year. You should have your blood pressure measured twice-once when you are at a hospital or clinic, and once when you are not at a hospital or clinic. Record the average of the two measurements. To check your blood pressure when you are not at a hospital or clinic, you can use: ? An automated blood pressure  machine at a pharmacy. ? A home blood pressure monitor.  If you are between 28 years and 64 years old, ask your health care provider if you should take aspirin to prevent strokes.  Have regular diabetes screenings. This involves taking a blood sample to check your fasting blood sugar level. ? If you are at a normal weight and have a low risk for diabetes, have this test once every three years after 81 years of age. ? If you are overweight and have a high risk for diabetes, consider being tested at a younger age or more often. Preventing infection Hepatitis B  If you have a higher risk for hepatitis B, you should be screened for this virus. You are considered at high risk for hepatitis B if: ? You were born in a country where hepatitis B is common. Ask your health care provider which countries are considered high risk. ? Your parents were born in a high-risk country, and you have not been immunized against hepatitis B (hepatitis B vaccine). ? You have HIV or AIDS. ? You use needles to inject street drugs. ? You live with someone who has hepatitis B. ? You have had sex with someone who has hepatitis B. ? You get hemodialysis treatment. ? You take certain medicines for conditions, including cancer, organ transplantation, and autoimmune conditions.  Hepatitis C  Blood testing is recommended for: ? Everyone born from [birth year] through 1963. ? Anyone with known risk factors for hepatitis C.  Sexually transmitted infections (STIs)  You should be screened for sexually transmitted infections (STIs) including gonorrhea and chlamydia if: ? You are sexually active and are younger than 81 years of age. ? You are older than 81 years of age and your health care provider tells you that you are at risk for this type of infection. ? Your sexual activity has changed since you were last screened and you are at an increased risk for chlamydia or gonorrhea. Ask your health care provider if you are at  risk.  If you do not have HIV, but are at risk, it may be recommended that you take a prescription medicine daily to prevent HIV infection. This is called pre-exposure prophylaxis (PrEP). You are considered at risk if: ? You are sexually active and do not regularly use condoms or know the HIV status of your partner(s). ? You take drugs by injection. ? You are sexually active with a partner who has HIV.  Talk with  your health care provider about whether you are at high risk of being infected with HIV. If you choose to begin PrEP, you should first be tested for HIV. You should then be tested every 3 months for as long as you are taking PrEP. Pregnancy  If you are premenopausal and you may become pregnant, ask your health care provider about preconception counseling.  If you may become pregnant, take 400 to 800 micrograms (mcg) of folic acid every day.  If you want to prevent pregnancy, talk to your health care provider about birth control (contraception). Osteoporosis and menopause  Osteoporosis is a disease in which the bones lose minerals and strength with aging. This can result in serious bone fractures. Your risk for osteoporosis can be identified using a bone density scan.  If you are 34 years of age or older, or if you are at risk for osteoporosis and fractures, ask your health care provider if you should be screened.  Ask your health care provider whether you should take a calcium or vitamin D supplement to lower your risk for osteoporosis.  Menopause may have certain physical symptoms and risks.  Hormone replacement therapy may reduce some of these symptoms and risks. Talk to your health care provider about whether hormone replacement therapy is right for you. Follow these instructions at home:  Schedule regular health, dental, and eye exams.  Stay current with your immunizations.  Do not use any tobacco products including cigarettes, chewing tobacco, or electronic  cigarettes.  If you are pregnant, do not drink alcohol.  If you are breastfeeding, limit how much and how often you drink alcohol.  Limit alcohol intake to no more than 1 drink per day for nonpregnant women. One drink equals 12 ounces of beer, 5 ounces of wine, or 1 ounces of hard liquor.  Do not use street drugs.  Do not share needles.  Ask your health care provider for help if you need support or information about quitting drugs.  Tell your health care provider if you often feel depressed.  Tell your health care provider if you have ever been abused or do not feel safe at home. This information is not intended to replace advice given to you by your health care provider. Make sure you discuss any questions you have with your health care provider. Document Released: 12/04/2010 Document Revised: 10/27/2015 Document Reviewed: 02/22/2015 Elsevier Interactive Patient Education  [employer].

## 2017-02-14 NOTE — Progress Notes (Signed)
BP 138/60 (BP Location: Left Arm, Patient Position: Sitting, Cuff Size: Normal)   Pulse 65   Temp 97.8 F (36.6 C) (Oral)   Ht 5' (1.524 m)   Wt 122 lb 8 oz (55.6 kg)   SpO2 99%   BMI 23.92 kg/m    CC: CPE Subjective:    Patient ID: Taylor Castaneda, female    DOB: 12/26/1932, 83 y.o.   MRN: [id number]  HPI: Taylor Castaneda is a 83 y.o. female presenting on 02/14/2017 for Medicare Wellness (Provided record of recent BP readings)   Saw Taylor Castaneda last week for medicare wellness visit. Note reviewed. Failed hearing screen.   Recent L knee replacement. She has completed PT and was released from ortho care. She has home exercises, walking regularly.  She did have syncopal episode at home last month- feeling better since antihypertensives were changed - continued lasix 20mg  daily and carvedilol 12.5mg  bid. Ramipril was kept on hold. She brings bp log showing 100-140s/60-70s, HR 70-80s. Isolated to 124P systolic.   L heel ulcer - she continues neosporin and bandaids. Feels this is healing well.   Preventative: COLONOSCOPY Date: 06/2013 1 hyperplastic polyp, ileocecal anastomosis [facility] Plan)  DEXA - Date: 04/2014 T score stable at -2.2 at hip. Calcium stopped by endo.  Well woman with OBGYN Dr Stann Mainland @ [facility] - mammogram 2015. Last seen 2015. Declines repeat. She does breast exams at home.  Flu yearly Pneumovax 2012, prevnar 2015 Td 1998 zostavax 2009 shingrix - discussed Advanced directive - scanned and in chart 08/2016. HCPOA not included, but would want it to be daughter Taylor Castaneda. Asked to check at home for this, HCPOA form provided today.  Seat belt use discussed Sunscreen use discussed. No changing moles on skin. Ex smoker - quit 1975 Alcohol - 1 glass wine/month  Lives alone, no pets Neighbors keep an eye on each other - 12 unit condo.  Family nearby. Occ: retired Regulatory affairs officer Activity: walking Diet: good water, fruits/vegetables daily  Relevant past medical, surgical, family  and social history reviewed and updated as indicated. Interim medical history since our last visit reviewed. Allergies and medications reviewed and updated. Outpatient Medications Prior to Visit  Medication Sig Dispense Refill  . atorvastatin (LIPITOR) 40 MG tablet TAKE ONE-HALF TABLET BY MOUTH EVERY DAY AT 6PM (Patient taking differently: TAKE ONE-HALF TABLET(20 MG)  BY MOUTH EVERY DAY AT NIGHT) 45 tablet 1  . B-D ULTRAFINE III SHORT PEN 31G X 8 MM MISC USE AS DIRECTED WITH INSULIN 100 each 3  . Carboxymethylcellul-Glycerin (CLEAR EYES FOR DRY EYES) 1-0.25 % SOLN Place 1-2 drops into both eyes 3 (three) times daily as needed (for dry/irritated eyes.).    Marland Kitchen carvedilol (COREG) 25 MG tablet Take 12.5 mg by mouth 2 (two) times daily.  2  . furosemide (LASIX) 40 MG tablet Take 0.5 tablets (20 mg total) by mouth daily. 45 tablet 1  . glucose blood (FREESTYLE LITE) test strip Use as instructed (Patient taking differently: 1 each by Other route 4 (four) times daily. Use as instructed) 100 each 2  . HUMALOG KWIKPEN 100 UNIT/ML KiwkPen Inject 10 Units into the skin 3 (three) times daily before meals.  4  . KLOR-CON M10 10 MEQ tablet TAKE ONE TABLET BY MOUTH ON MONDAY, WEDNESDAY, AND FRIDAY (Patient taking differently: TAKE ONE TABLET BY MOUTH ON MONDAY, WEDNESDAY, AND FRIDAY IN THE MORNING) 36 tablet 3  . LANTUS SOLOSTAR 100 UNIT/ML Solostar Pen Inject 35 Units into the skin at bedtime.  3   No facility-administered medications prior to visit.      Per HPI unless specifically indicated in ROS section below Review of Systems  Constitutional: Negative for activity change, appetite change, chills, fatigue, fever and unexpected weight change.  HENT: Negative for hearing loss.   Eyes: Negative for visual disturbance.  Respiratory: Negative for cough, chest tightness, shortness of breath and wheezing.   Cardiovascular: Negative for chest pain, palpitations and leg swelling.  Gastrointestinal: Negative for  abdominal distention, abdominal pain, blood in stool, constipation, diarrhea, nausea and vomiting.  Genitourinary: Negative for difficulty urinating and hematuria.  Musculoskeletal: Negative for arthralgias, myalgias and neck pain.  Skin: Negative for rash.  Neurological: Negative for dizziness, seizures, syncope and headaches.  Hematological: Negative for adenopathy. Does not bruise/bleed easily.  Psychiatric/Behavioral: Negative for dysphoric mood. The patient is not nervous/anxious.        Objective:    BP 138/60 (BP Location: Left Arm, Patient Position: Sitting, Cuff Size: Normal)   Pulse 65   Temp 97.8 F (36.6 C) (Oral)   Ht 5' (1.524 m)   Wt 122 lb 8 oz (55.6 kg)   SpO2 99%   BMI 23.92 kg/m   Wt Readings from Last 3 Encounters:  02/14/17 122 lb 8 oz (55.6 kg)  02/07/17 122 lb 8 oz (55.6 kg)  01/14/17 121 lb 8 oz (55.1 kg)    Physical Exam  Constitutional: She is oriented to person, place, and time. She appears well-developed and well-nourished. No distress.  HENT:  Head: Normocephalic and atraumatic.  Right Ear: Hearing, tympanic membrane, external ear and ear canal normal.  Left Ear: Hearing, tympanic membrane, external ear and ear canal normal.  Nose: Nose normal.  Mouth/Throat: Uvula is midline, oropharynx is clear and moist and mucous membranes are normal. No oropharyngeal exudate, posterior oropharyngeal edema or posterior oropharyngeal erythema.  Eyes: Pupils are equal, round, and reactive to light. Conjunctivae and EOM are normal. No scleral icterus.  Neck: Normal range of motion. Neck supple. Carotid bruit is not present. No thyromegaly present.  Cardiovascular: Normal rate, regular rhythm, normal heart sounds and intact distal pulses.   No murmur heard. Pulses:      Radial pulses are 2+ on the right side, and 2+ on the left side.  Pulmonary/Chest: Effort normal and breath sounds normal. No respiratory distress. She has no wheezes. She has no rales.  Abdominal:  Soft. Bowel sounds are normal. She exhibits no distension and no mass. There is no tenderness. There is no rebound and no guarding.  Musculoskeletal: Normal range of motion. She exhibits no edema.  L knee incision healed well  Lymphadenopathy:    She has no cervical adenopathy.  Neurological: She is alert and oriented to person, place, and time.  CN grossly intact, station and gait intact  Skin: Skin is warm and dry. No rash noted.  Psychiatric: She has a normal mood and affect. Her behavior is normal. Judgment and thought content normal.  Nursing note and vitals reviewed.  Results for orders placed or performed in visit on 02/11/17  HM DIABETES EYE EXAM  Result Value Ref Range   HM Diabetic Eye Exam No Retinopathy No Retinopathy   Lab Results  Component Value Date   WBC 8.3 01/14/2017   HGB 11.5 (L) 01/14/2017   HCT 35.1 (L) 01/14/2017   MCV 95.9 01/14/2017   PLT 276.0 01/14/2017       Assessment & Plan:   Problem List Items Addressed This Visit  Advanced care planning/counseling discussion    Advanced directive - scanned and in chart 08/2016. HCPOA not included, but would want it to be daughter Taylor Castaneda. Asked to check at home for this, HCPOA form provided today.       CKD stage 3 due to type 2 diabetes mellitus (HCC)    Cr stable to slightly improved. Continue to monitor.       Relevant Medications   LANTUS SOLOSTAR 100 UNIT/ML Solostar Pen   Essential hypertension    Chronic, stable on lasix 20mg  daily and carvedilol 12.5mg  bid. rec continue to hold ramipril for now. Consider addition of low dose in future if Cr stays stable.       Health maintenance examination - Primary    Preventative protocols reviewed and updated unless pt declined. Discussed healthy diet and lifestyle.       HLD (hyperlipidemia)    Chronic. Continue lipitor. Reviewed diet choices to improve triglyceride levels.       OA (osteoarthritis) of knee    Continues healing well after recent  knee replacement.       Pressure ulcer of left heel, stage 2    This is healing well, scabbing over, no erythema or tenderness. Reviewed further home care.       RESOLVED: Syncope    No further syncope.       Uncontrolled type 2 diabetes mellitus with nephropathy ([place])    Followed by endo. Great control as evidenced by A1c 6.2%. Pt denies hypoglycemia symptoms or low sugar readings. Advised to check sugars occasionally 2 hrs after meal and bring log to endo appt next week. Continue current regimen.       Relevant Medications   LANTUS SOLOSTAR 100 UNIT/ML Solostar Pen       Follow up plan: Return in about 6 months (around 08/14/2017) for follow up visit.  [name], MD

## 2017-02-14 NOTE — Assessment & Plan Note (Signed)
Continues healing well after recent knee replacement.

## 2017-02-14 NOTE — Assessment & Plan Note (Signed)
Followed by endo. Great control as evidenced by A1c 6.2%. Pt denies hypoglycemia symptoms or low sugar readings. Advised to check sugars occasionally 2 hrs after meal and bring log to endo appt next week. Continue current regimen.

## 2017-02-14 NOTE — Assessment & Plan Note (Signed)
Chronic. Continue lipitor. Reviewed diet choices to improve triglyceride levels.

## 2017-02-14 NOTE — Assessment & Plan Note (Addendum)
Advanced directive - scanned and in chart 08/2016. HCPOA not included, but would want it to be daughter [name]. Asked to check at home for this, HCPOA form provided today.

## 2017-02-14 NOTE — Assessment & Plan Note (Signed)
This is healing well, scabbing over, no erythema or tenderness. Reviewed further home care.

## 2017-02-14 NOTE — Assessment & Plan Note (Signed)
Preventative protocols reviewed and updated unless pt declined. Discussed healthy diet and lifestyle.  

## 2017-02-14 NOTE — Assessment & Plan Note (Signed)
No further syncope.

## 2017-02-16 NOTE — Progress Notes (Signed)
I reviewed health advisor's note, was available for consultation, and agree with documentation and plan.  

## 2017-02-18 ENCOUNTER — Encounter: Payer: Medicare Other | Admitting: Physical Therapy

## 2017-02-20 ENCOUNTER — Encounter: Payer: Medicare Other | Admitting: Physical Therapy

## 2017-02-21 ENCOUNTER — Encounter: Payer: Medicare Other | Admitting: Physical Therapy

## 2017-02-21 DIAGNOSIS — R809 Proteinuria, unspecified: Secondary | ICD-10-CM | POA: Diagnosis not present

## 2017-02-21 DIAGNOSIS — E7800 Pure hypercholesterolemia, unspecified: Secondary | ICD-10-CM | POA: Diagnosis not present

## 2017-02-21 DIAGNOSIS — I10 Essential (primary) hypertension: Secondary | ICD-10-CM | POA: Diagnosis not present

## 2017-02-21 DIAGNOSIS — E1165 Type 2 diabetes mellitus with hyperglycemia: Secondary | ICD-10-CM | POA: Diagnosis not present

## 2017-02-25 ENCOUNTER — Encounter: Payer: Medicare Other | Admitting: Physical Therapy

## 2017-02-27 ENCOUNTER — Encounter: Payer: Medicare Other | Admitting: Physical Therapy

## 2017-02-28 ENCOUNTER — Encounter: Payer: Medicare Other | Admitting: Physical Therapy

## 2017-03-19 ENCOUNTER — Other Ambulatory Visit: Payer: Self-pay | Admitting: Family Medicine

## 2017-03-28 DIAGNOSIS — E1165 Type 2 diabetes mellitus with hyperglycemia: Secondary | ICD-10-CM | POA: Diagnosis not present

## 2017-04-29 ENCOUNTER — Ambulatory Visit: Payer: Medicare Other | Admitting: Internal Medicine

## 2017-04-29 ENCOUNTER — Encounter: Payer: Self-pay | Admitting: Internal Medicine

## 2017-04-29 VITALS — BP 134/64 | HR 62 | Temp 97.3°F | Wt 129.0 lb

## 2017-04-29 DIAGNOSIS — Z8739 Personal history of other diseases of the musculoskeletal system and connective tissue: Secondary | ICD-10-CM | POA: Insufficient documentation

## 2017-04-29 DIAGNOSIS — M109 Gout, unspecified: Secondary | ICD-10-CM

## 2017-04-29 LAB — RENAL FUNCTION PANEL
ALBUMIN: 4 g/dL (ref 3.5–5.2)
BUN: 33 mg/dL — AB (ref 6–23)
CO2: 29 mEq/L (ref 19–32)
CREATININE: 1.51 mg/dL — AB (ref 0.40–1.20)
Calcium: 9.7 mg/dL (ref 8.4–10.5)
Chloride: 100 mEq/L (ref 96–112)
GFR: 34.99 mL/min — ABNORMAL LOW (ref >60.00)
GLUCOSE: 247 mg/dL — AB (ref 70–99)
POTASSIUM: 4.7 meq/L (ref 3.5–5.1)
Phosphorus: 3.8 mg/dL (ref 2.3–4.6)
SODIUM: 139 meq/L (ref 135–145)

## 2017-04-29 LAB — URIC ACID: URIC ACID, SERUM: 10 mg/dL — AB (ref 2.4–7.0)

## 2017-04-29 MED ORDER — COLCHICINE-PROBENECID 0.5-500 MG PO TABS: 1.0000 | ORAL_TABLET | Freq: Two times a day (BID) | ORAL | 0 refills | Status: DC | PRN

## 2017-04-29 NOTE — Assessment & Plan Note (Signed)
Appears to have new onset of gout Very typical in right 1st MTP and now moved to more tenosynovitis of Achilles insertion on left Discussed NSAIDs---should limit Will give colchicine prn --discussed GI side effects Will check uric acid level No inciting meds noted

## 2017-04-29 NOTE — Progress Notes (Signed)
Subjective:    Patient ID: Taylor Castaneda, female    DOB: 08-03-1932, 83 y.o.   MRN: [id number]  HPI Here due to left heel pain  Last week, noted inflammation of right 1st MTP Some better now Then noticed inflammation and pain along left Achilles Very painful Has to use crutch to walk  Has tried 1 aleve --it helps some  Current Outpatient Medications on File Prior to Visit  Medication Sig Dispense Refill  . atorvastatin (LIPITOR) 40 MG tablet TAKE ONE-HALF TABLET BY MOUTH EVERY DAY AT 6PM (Patient taking differently: TAKE ONE-HALF TABLET(20 MG)  BY MOUTH EVERY DAY AT NIGHT) 45 tablet 1  . B-D ULTRAFINE III SHORT PEN 31G X 8 MM MISC USE AS DIRECTED WITH INSULIN 100 each 3  . Carboxymethylcellul-Glycerin (CLEAR EYES FOR DRY EYES) 1-0.25 % SOLN Place 1-2 drops into both eyes 3 (three) times daily as needed (for dry/irritated eyes.).    Marland Kitchen carvedilol (COREG) 25 MG tablet Take 12.5 mg by mouth 2 (two) times daily.  2  . Cholecalciferol (VITAMIN D3 PO) Take 2,500 Units by mouth daily.    . furosemide (LASIX) 40 MG tablet Take 0.5 tablets (20 mg total) by mouth daily. 45 tablet 1  . glucose blood (FREESTYLE LITE) test strip Use as instructed (Patient taking differently: 1 each by Other route 4 (four) times daily. Use as instructed) 100 each 2  . HUMALOG KWIKPEN 100 UNIT/ML KiwkPen Inject 10 Units into the skin 3 (three) times daily before meals.  4  . KLOR-CON M10 10 MEQ tablet TAKE ONE TABLET BY MOUTH ON MONDAY, WEDNESDAY, AND FRIDAY (Patient taking differently: TAKE ONE TABLET BY MOUTH ON MONDAY, WEDNESDAY, AND FRIDAY IN THE MORNING) 36 tablet 3  . LANTUS SOLOSTAR 100 UNIT/ML Solostar Pen Inject 30 Units into the skin at bedtime.     No current facility-administered medications on file prior to visit.     Allergies  Allergen Reactions  . Pravastatin Sodium     REACTION: anemia as per Dr Gilford Rile, [facility]    Past Medical History:  Diagnosis Date  . Anemia   . Diabetes mellitus,  type II ([place])   . Glaucoma    Dr.Hecker  . History of colon cancer 1998   Dr. Lennie Hummer  . Hyperlipemia   . Osteopenia 02/2012, 03/2014   DEXA hip -2.2  . Renal insufficiency   . Stenosing tenosynovitis of finger of left hand 2016   index - s/p steroid injection x2    Past Surgical History:  Procedure Laterality Date  . ABI  11/2013   WNL  . APPENDECTOMY    . CARPAL TUNNEL RELEASE     Bilateral   . CATARACT EXTRACTION Bilateral 2006   Implants  . COLECTOMY  1998   For cancer  . COLONOSCOPY  2008   Dr.Stark, Due 2011  . COLONOSCOPY  06/2013   1 hyperplastic polyp, ileocecal anastomosis [facility])  . dexa  02/2012, 03/2014   T score -2.2 at hip overall stable  . REFRACTIVE SURGERY    . TONSILLECTOMY AND ADENOIDECTOMY    . TOTAL KNEE ARTHROPLASTY Left 12/17/2016   Procedure: LEFT TOTAL KNEE ARTHROPLASTY;  Surgeon: Gaynelle Arabian, MD;  Location: [facility];  Service: Orthopedics;  Laterality: Left;    Family History  Problem Relation Age of Onset  . Heart attack Father 86  . Stroke Father 26  . Diabetes Father   . Hypertension Father   . Heart disease Father  before age 56  . Breast cancer Mother   . Cancer Mother   . Diabetes Sister   . Cancer Sister   . Peripheral vascular disease Sister   . Breast cancer Sister   . Hypertension Son   . COPD Neg Hx   . Asthma Neg Hx   . Colon cancer Neg Hx   . Esophageal cancer Neg Hx   . Rectal cancer Neg Hx   . Stomach cancer Neg Hx     Social History   Socioeconomic History  . Marital status: Widowed    Spouse name: Not on file  . Number of children: 4  . Years of education: Not on file  . Highest education level: Not on file  Social Needs  . Financial resource strain: Not on file  . Food insecurity - worry: Not on file  . Food insecurity - inability: Not on file  . Transportation needs - medical: Not on file  . Transportation needs - non-medical: Not on file  Occupational History  . Occupation: retired     Fish farm manager: Retired    Comment: Regulatory affairs officer  Tobacco Use  . Smoking status: Former Smoker    Years: 2.00    Types: Cigarettes    Last attempt to quit: 06/04/1973    Years since quitting: 43.9  . Smokeless tobacco: Never Used  . Tobacco comment: Smoked for 5 years 1965-1970 up to 1/2 pp week  Substance and Sexual Activity  . Alcohol use: Yes    Comment: 1 glass of wine per month  . Drug use: No  . Sexual activity: No  Other Topics Concern  . Not on file  Social History Narrative   Lives alone, no pets   Neighbors keep an eye on each other - 12 unit condo.    Family nearby.   Occ: retired Regulatory affairs officer   Activity: walking   Diet: good water, fruits/vegetables daily   Review of Systems  No fever No vomiting or diarrhea     Objective:   Physical Exam  Musculoskeletal:  Swelling of right 1st MTP but no longer tender. Minimal redness Red nodule along insertion of left Achilles into heel--very tender          Assessment & Plan:

## 2017-04-29 NOTE — Patient Instructions (Signed)
Please use the new medication up to twice a day until all the pain is gone (and then once a day for 2-3 more days). You can take 1 aleve daily also if the pain persists (and I will let you know about your kidney function)

## 2017-04-30 ENCOUNTER — Other Ambulatory Visit: Payer: Self-pay | Admitting: Family Medicine

## 2017-04-30 NOTE — Telephone Encounter (Signed)
Pt. Will have [facility] transfer her colchicine to her  [facility]erred [facility].

## 2017-04-30 NOTE — Telephone Encounter (Signed)
Copied from [place]. Topic: Quick Communication - Rx Refill/Question >> Apr 30, 2017  9:49 AM Scherrie Gerlach wrote: Has the patient contacted their pharmacy? {yes  Pt states the med colchicine-probenecid 0.5-500 MG tablet that was called to [facility] yesterday is not in stock and they do not know when they will get it.  Would like you to resend to [facility]   [phone]         Agent: Please be advised that RX refills may take up to 48 hours. We ask that you follow-up with your pharmacy.

## 2017-05-06 DIAGNOSIS — E1165 Type 2 diabetes mellitus with hyperglycemia: Secondary | ICD-10-CM | POA: Diagnosis not present

## 2017-06-24 ENCOUNTER — Other Ambulatory Visit: Payer: Self-pay | Admitting: Family Medicine

## 2017-08-02 ENCOUNTER — Ambulatory Visit: Payer: Medicare Other

## 2017-08-09 ENCOUNTER — Encounter: Payer: Medicare Other | Admitting: Family Medicine

## 2017-08-14 ENCOUNTER — Ambulatory Visit: Payer: Medicare Other | Admitting: Family Medicine

## 2017-08-20 ENCOUNTER — Encounter: Payer: Self-pay | Admitting: Family Medicine

## 2017-08-20 ENCOUNTER — Ambulatory Visit: Payer: Medicare Other | Admitting: Family Medicine

## 2017-08-20 VITALS — BP 120/70 | HR 74 | Temp 97.8°F | Wt 135.0 lb

## 2017-08-20 DIAGNOSIS — Z794 Long term (current) use of insulin: Secondary | ICD-10-CM

## 2017-08-20 DIAGNOSIS — E1122 Type 2 diabetes mellitus with diabetic chronic kidney disease: Secondary | ICD-10-CM | POA: Diagnosis not present

## 2017-08-20 DIAGNOSIS — I10 Essential (primary) hypertension: Secondary | ICD-10-CM

## 2017-08-20 DIAGNOSIS — E785 Hyperlipidemia, unspecified: Secondary | ICD-10-CM

## 2017-08-20 DIAGNOSIS — E1121 Type 2 diabetes mellitus with diabetic nephropathy: Secondary | ICD-10-CM | POA: Diagnosis not present

## 2017-08-20 DIAGNOSIS — N183 Chronic kidney disease, stage 3 (moderate): Secondary | ICD-10-CM | POA: Diagnosis not present

## 2017-08-20 LAB — RENAL FUNCTION PANEL
Albumin: 4 g/dL (ref 3.5–5.2)
BUN: 31 mg/dL — ABNORMAL HIGH (ref 6–23)
CALCIUM: 9.7 mg/dL (ref 8.4–10.5)
CO2: 27 mEq/L (ref 19–32)
Chloride: 103 mEq/L (ref 96–112)
Creatinine, Ser: 1.33 mg/dL — ABNORMAL HIGH (ref 0.40–1.20)
GFR: 40.48 mL/min — AB (ref >60.00)
GLUCOSE: 189 mg/dL — AB (ref 70–99)
Phosphorus: 3.8 mg/dL (ref 2.3–4.6)
Potassium: 4.6 mEq/L (ref 3.5–5.1)
Sodium: 139 mEq/L (ref 135–145)

## 2017-08-20 LAB — MICROALBUMIN / CREATININE URINE RATIO
Creatinine,U: 21.6 mg/dL
MICROALB/CREAT RATIO: 10.1 mg/g (ref 0.0–30.0)
Microalb, Ur: 2.2 mg/dL — ABNORMAL HIGH (ref 0.0–1.9)

## 2017-08-20 LAB — HEMOGLOBIN A1C: HEMOGLOBIN A1C: 8.7 % — AB (ref 4.6–6.5)

## 2017-08-20 NOTE — Assessment & Plan Note (Signed)
Chronic, stable. Continue lipitor.  

## 2017-08-20 NOTE — Assessment & Plan Note (Signed)
Chronic, stable. Continue lasix 20mg  daily, carvedilol 12.5mg  bid. Discussed possibly restarting ramipril if Cr stable/improved.

## 2017-08-20 NOTE — Patient Instructions (Addendum)
You are doing well. Labs today. Continue current medicines. We may restart ramipril pending lab results.

## 2017-08-20 NOTE — Progress Notes (Signed)
BP 120/70 (BP Location: Left Arm, Patient Position: Sitting, Cuff Size: Normal)   Pulse 74   Temp 97.8 F (36.6 C) (Oral)   Wt 135 lb (61.2 kg)   SpO2 95%   BMI 26.37 kg/m    CC: 6 mo f/u visit Subjective:    Patient ID: Taylor Castaneda, female    DOB: December 21, 1932, 84 y.o.   MRN: [id number]  HPI: Taylor Castaneda is a 84 y.o. female presenting on 08/20/2017 for 6 mo follow-up   DM - followed by endo Dr Chalmers Cater. On lantus and novolog. Last A1c 6.2% (02/2017) - upcoming appt later this month.  HLD - complaint with lipitor 40mg  1/2 tab daily without myalgias.  HTN - Compliant with current antihypertensive regimen of lasix 20mg  daily, carvedilol 12.5mg  bid. Has stayed off ramipril. Does check blood pressures at home: well controlled. No low blood pressure readings or symptoms of dizziness/syncope. Denies HA, vision changes, CP/tightness, SOB, leg swelling.  Diabetic Foot Exam - Simple   Simple Foot Form Diabetic Foot exam was performed with the following findings:  Yes 08/20/2017 10:17 AM  Visual Inspection No deformities, no ulcerations, no other skin breakdown bilaterally:  Yes Sensation Testing Intact to touch and monofilament testing bilaterally:  Yes Pulse Check Posterior Tibialis and Dorsalis pulse intact bilaterally:  Yes Comments      Knee is doing well - staying active with exercises and water aerobics.   Relevant past medical, surgical, family and social history reviewed and updated as indicated. Interim medical history since our last visit reviewed. Allergies and medications reviewed and updated. Outpatient Medications Prior to Visit  Medication Sig Dispense Refill  . atorvastatin (LIPITOR) 40 MG tablet TAKE 1/2 (ONE-HALF) TABLET BY MOUTH ONCE DAILY 45 tablet 0  . B-D ULTRAFINE III SHORT PEN 31G X 8 MM MISC USE AS DIRECTED WITH INSULIN 100 each 3  . Carboxymethylcellul-Glycerin (CLEAR EYES FOR DRY EYES) 1-0.25 % SOLN Place 1-2 drops into both eyes 3 (three) times daily as  needed (for dry/irritated eyes.).    Marland Kitchen carvedilol (COREG) 25 MG tablet Take 12.5 mg by mouth 2 (two) times daily.  2  . Cholecalciferol (VITAMIN D3 PO) Take 2,500 Units by mouth daily.    . colchicine-probenecid 0.5-500 MG tablet Take 1 tablet by mouth 2 (two) times daily as needed. 60 tablet 0  . furosemide (LASIX) 40 MG tablet Take 0.5 tablets (20 mg total) by mouth daily. 45 tablet 1  . glucose blood (FREESTYLE LITE) test strip Use as instructed (Patient taking differently: 1 each by Other route 4 (four) times daily. Use as instructed) 100 each 2  . HUMALOG KWIKPEN 100 UNIT/ML KiwkPen Inject 10 Units into the skin 3 (three) times daily before meals.  4  . KLOR-CON M10 10 MEQ tablet TAKE ONE TABLET BY MOUTH ON MONDAY, WEDNESDAY, AND FRIDAY 36 tablet 3  . LANTUS SOLOSTAR 100 UNIT/ML Solostar Pen Inject 30 Units into the skin at bedtime.    . furosemide (LASIX) 40 MG tablet TAKE 1 TABLET BY MOUTH ONCE DAILY 90 tablet 0   No facility-administered medications prior to visit.      Per HPI unless specifically indicated in ROS section below Review of Systems     Objective:    BP 120/70 (BP Location: Left Arm, Patient Position: Sitting, Cuff Size: Normal)   Pulse 74   Temp 97.8 F (36.6 C) (Oral)   Wt 135 lb (61.2 kg)   SpO2 95%   BMI 26.37 kg/m  Wt Readings from Last 3 Encounters:  08/20/17 135 lb (61.2 kg)  04/29/17 129 lb (58.5 kg)  02/14/17 122 lb 8 oz (55.6 kg)    Physical Exam  Constitutional: She appears well-developed and well-nourished. No distress.  HENT:  Head: Normocephalic and atraumatic.  Right Ear: External ear normal.  Left Ear: External ear normal.  Nose: Nose normal.  Mouth/Throat: Oropharynx is clear and moist. No oropharyngeal exudate.  Eyes: Conjunctivae and EOM are normal. Pupils are equal, round, and reactive to light. No scleral icterus.  Neck: Normal range of motion. Neck supple.  Cardiovascular: Normal rate, regular rhythm, normal heart sounds and  intact distal pulses.  No murmur heard. Pulmonary/Chest: Effort normal and breath sounds normal. No respiratory distress. She has no wheezes. She has no rales.  Musculoskeletal: She exhibits no edema.  See HPI for foot exam if done  Lymphadenopathy:    She has no cervical adenopathy.  Skin: Skin is warm and dry. No rash noted.  Psychiatric: She has a normal mood and affect.  Nursing note and vitals reviewed.  Results for orders placed or performed in visit on 04/29/17  Uric acid  Result Value Ref Range   Uric Acid, Serum 10.0 (H) 2.4 - 7.0 mg/dL  Renal function panel  Result Value Ref Range   Sodium 139 135 - 145 mEq/L   Potassium 4.7 3.5 - 5.1 mEq/L   Chloride 100 96 - 112 mEq/L   CO2 29 19 - 32 mEq/L   Calcium 9.7 8.4 - 10.5 mg/dL   Albumin 4.0 3.5 - 5.2 g/dL   BUN 33 (H) 6 - 23 mg/dL   Creatinine, Ser 1.51 (H) 0.40 - 1.20 mg/dL   Glucose, Bld 247 (H) 70 - 99 mg/dL   Phosphorus 3.8 2.3 - 4.6 mg/dL   GFR 34.99 (L) >60.00 mL/min      Assessment & Plan:   Problem List Items Addressed This Visit    CKD stage 3 due to type 2 diabetes mellitus ([place])    Update labs.       Relevant Orders   Renal function panel   Controlled type 2 diabetes mellitus with diabetic nephropathy, with long-term current use of insulin ([facility]) - Primary    Chronic, stable. Appreciate care of endo. Will update labs today including microalb and Cr. If Cr stable, discussed restarting ACEI (previously held for syncopal illness with ER eval). Continue f/u with endo.       Relevant Orders   Hemoglobin A1c   Microalbumin / creatinine urine ratio   Essential hypertension    Chronic, stable. Continue lasix 20mg  daily, carvedilol 12.5mg  bid. Discussed possibly restarting ramipril if Cr stable/improved.       HLD (hyperlipidemia)    Chronic, stable. Continue lipitor.           No orders of the defined types were placed in this encounter.  Orders Placed This Encounter  Procedures  . Hemoglobin A1c    . Renal function panel  . Microalbumin / creatinine urine ratio    Follow up plan: Return in about 6 months (around 02/20/2018) for annual exam, prior fasting for blood work, medicare wellness visit.  [name], MD

## 2017-08-20 NOTE — Assessment & Plan Note (Signed)
Update labs.  

## 2017-08-20 NOTE — Assessment & Plan Note (Signed)
Chronic, stable. Appreciate care of endo. Will update labs today including microalb and Cr. If Cr stable, discussed restarting ACEI (previously held for syncopal illness with ER eval). Continue f/u with endo.

## 2017-08-24 ENCOUNTER — Other Ambulatory Visit: Payer: Self-pay | Admitting: Family Medicine

## 2017-08-24 DIAGNOSIS — E1165 Type 2 diabetes mellitus with hyperglycemia: Secondary | ICD-10-CM | POA: Diagnosis not present

## 2017-08-24 MED ORDER — RAMIPRIL 2.5 MG PO CAPS: 2.5000 mg | ORAL_CAPSULE | Freq: Every day | ORAL | 6 refills | Status: DC

## 2017-08-29 DIAGNOSIS — R809 Proteinuria, unspecified: Secondary | ICD-10-CM | POA: Diagnosis not present

## 2017-08-29 DIAGNOSIS — I10 Essential (primary) hypertension: Secondary | ICD-10-CM | POA: Diagnosis not present

## 2017-08-29 DIAGNOSIS — E1165 Type 2 diabetes mellitus with hyperglycemia: Secondary | ICD-10-CM | POA: Diagnosis not present

## 2017-08-29 DIAGNOSIS — E7800 Pure hypercholesterolemia, unspecified: Secondary | ICD-10-CM | POA: Diagnosis not present

## 2017-09-30 ENCOUNTER — Other Ambulatory Visit: Payer: Self-pay | Admitting: Family Medicine

## 2017-09-30 NOTE — Telephone Encounter (Signed)
Electronic refill request Last office visit 08/1917 Last refill 06/24/17 #45 See allergy/contraindicaiton

## 2017-10-31 DIAGNOSIS — E1165 Type 2 diabetes mellitus with hyperglycemia: Secondary | ICD-10-CM | POA: Diagnosis not present

## 2017-12-09 DIAGNOSIS — E1165 Type 2 diabetes mellitus with hyperglycemia: Secondary | ICD-10-CM | POA: Diagnosis not present

## 2017-12-09 DIAGNOSIS — H35033 Hypertensive retinopathy, bilateral: Secondary | ICD-10-CM | POA: Diagnosis not present

## 2017-12-09 DIAGNOSIS — H402232 Chronic angle-closure glaucoma, bilateral, moderate stage: Secondary | ICD-10-CM | POA: Diagnosis not present

## 2017-12-09 DIAGNOSIS — E119 Type 2 diabetes mellitus without complications: Secondary | ICD-10-CM | POA: Diagnosis not present

## 2017-12-12 DIAGNOSIS — Z96652 Presence of left artificial knee joint: Secondary | ICD-10-CM | POA: Diagnosis not present

## 2017-12-12 DIAGNOSIS — Z471 Aftercare following joint replacement surgery: Secondary | ICD-10-CM | POA: Diagnosis not present

## 2017-12-12 DIAGNOSIS — M1712 Unilateral primary osteoarthritis, left knee: Secondary | ICD-10-CM | POA: Diagnosis not present

## 2017-12-23 ENCOUNTER — Other Ambulatory Visit: Payer: Self-pay | Admitting: Family Medicine

## 2017-12-24 NOTE — Telephone Encounter (Signed)
Pharmacy requests refill on Furosemide.  Patient's dose is 20mg  daily (1/2 of 40mg  tablet).  Last OV: 08/20/17 Next OV: 02/20/18 Last Refill: #45 on 01/14/17    Will refill X 1 to carry patient through [facility] on 02/20/18  Thanks.

## 2017-12-30 DIAGNOSIS — E7800 Pure hypercholesterolemia, unspecified: Secondary | ICD-10-CM | POA: Diagnosis not present

## 2017-12-30 DIAGNOSIS — I10 Essential (primary) hypertension: Secondary | ICD-10-CM | POA: Diagnosis not present

## 2017-12-30 DIAGNOSIS — E1165 Type 2 diabetes mellitus with hyperglycemia: Secondary | ICD-10-CM | POA: Diagnosis not present

## 2017-12-30 DIAGNOSIS — R809 Proteinuria, unspecified: Secondary | ICD-10-CM | POA: Diagnosis not present

## 2018-01-01 DIAGNOSIS — C44712 Basal cell carcinoma of skin of right lower limb, including hip: Secondary | ICD-10-CM | POA: Diagnosis not present

## 2018-01-01 DIAGNOSIS — L578 Other skin changes due to chronic exposure to nonionizing radiation: Secondary | ICD-10-CM | POA: Diagnosis not present

## 2018-01-01 DIAGNOSIS — Z85828 Personal history of other malignant neoplasm of skin: Secondary | ICD-10-CM | POA: Diagnosis not present

## 2018-01-01 DIAGNOSIS — L820 Inflamed seborrheic keratosis: Secondary | ICD-10-CM | POA: Diagnosis not present

## 2018-01-11 ENCOUNTER — Other Ambulatory Visit: Payer: Self-pay | Admitting: Family Medicine

## 2018-01-13 NOTE — Telephone Encounter (Signed)
Electronic refill request Last office visit 08/20/17 Last refill 10/01/17 #45 Upcoming appointment 02/20/18 See allergy/contraindication

## 2018-01-15 DIAGNOSIS — E1165 Type 2 diabetes mellitus with hyperglycemia: Secondary | ICD-10-CM | POA: Diagnosis not present

## 2018-01-15 NOTE — Telephone Encounter (Signed)
Noted  

## 2018-02-04 ENCOUNTER — Telehealth: Payer: Self-pay | Admitting: Family Medicine

## 2018-02-04 NOTE — Telephone Encounter (Signed)
Placed in Dr. Synthia Innocent box.

## 2018-02-04 NOTE — Telephone Encounter (Signed)
Pt dropped off parking placard form to be filled out.Placed in [place] tower

## 2018-02-04 NOTE — Telephone Encounter (Signed)
Placed in [name]'s box.

## 2018-02-05 NOTE — Telephone Encounter (Addendum)
Attempted to contact pt. No answer. No vm.  Need to notify pt her form is ready to pick up.  [Placed form at front office.]

## 2018-02-05 NOTE — Telephone Encounter (Signed)
Pt aware form is ready to be picked up.

## 2018-02-06 ENCOUNTER — Ambulatory Visit: Payer: Medicare Other

## 2018-02-06 NOTE — Telephone Encounter (Signed)
Noted  

## 2018-02-17 ENCOUNTER — Other Ambulatory Visit: Payer: Self-pay | Admitting: Family Medicine

## 2018-02-17 DIAGNOSIS — E1122 Type 2 diabetes mellitus with diabetic chronic kidney disease: Secondary | ICD-10-CM

## 2018-02-17 DIAGNOSIS — E1121 Type 2 diabetes mellitus with diabetic nephropathy: Secondary | ICD-10-CM

## 2018-02-17 DIAGNOSIS — M109 Gout, unspecified: Secondary | ICD-10-CM

## 2018-02-17 DIAGNOSIS — Z794 Long term (current) use of insulin: Secondary | ICD-10-CM

## 2018-02-17 DIAGNOSIS — N183 Chronic kidney disease, stage 3 (moderate): Secondary | ICD-10-CM

## 2018-02-17 DIAGNOSIS — E785 Hyperlipidemia, unspecified: Secondary | ICD-10-CM

## 2018-02-18 ENCOUNTER — Ambulatory Visit (INDEPENDENT_AMBULATORY_CARE_PROVIDER_SITE_OTHER): Payer: Medicare Other

## 2018-02-18 VITALS — BP 124/80 | HR 72 | Temp 98.0°F | Ht 59.5 in | Wt 134.0 lb

## 2018-02-18 DIAGNOSIS — N183 Chronic kidney disease, stage 3 unspecified: Secondary | ICD-10-CM

## 2018-02-18 DIAGNOSIS — E1121 Type 2 diabetes mellitus with diabetic nephropathy: Secondary | ICD-10-CM

## 2018-02-18 DIAGNOSIS — E785 Hyperlipidemia, unspecified: Secondary | ICD-10-CM | POA: Diagnosis not present

## 2018-02-18 DIAGNOSIS — E1122 Type 2 diabetes mellitus with diabetic chronic kidney disease: Secondary | ICD-10-CM

## 2018-02-18 DIAGNOSIS — Z23 Encounter for immunization: Secondary | ICD-10-CM | POA: Diagnosis not present

## 2018-02-18 DIAGNOSIS — Z794 Long term (current) use of insulin: Secondary | ICD-10-CM | POA: Diagnosis not present

## 2018-02-18 DIAGNOSIS — Z0000 Encounter for general adult medical examination without abnormal findings: Secondary | ICD-10-CM

## 2018-02-18 DIAGNOSIS — M109 Gout, unspecified: Secondary | ICD-10-CM

## 2018-02-18 LAB — CBC WITH DIFFERENTIAL/PLATELET
BASOS PCT: 1 % (ref 0.0–3.0)
Basophils Absolute: 0.1 10*3/uL (ref 0.0–0.1)
EOS PCT: 2.4 % (ref 0.0–5.0)
Eosinophils Absolute: 0.2 10*3/uL (ref 0.0–0.7)
HCT: 37.8 % (ref 36.0–46.0)
HEMOGLOBIN: 12.8 g/dL (ref 12.0–15.0)
LYMPHS ABS: 2.7 10*3/uL (ref 0.7–4.0)
Lymphocytes Relative: 31.8 % (ref 12.0–46.0)
MCHC: 33.7 g/dL (ref 30.0–36.0)
MCV: 95 fl (ref 78.0–100.0)
MONO ABS: 0.9 10*3/uL (ref 0.1–1.0)
Monocytes Relative: 11.2 % (ref 3.0–12.0)
Neutro Abs: 4.5 10*3/uL (ref 1.4–7.7)
Neutrophils Relative %: 53.6 % (ref 43.0–77.0)
Platelets: 156 10*3/uL (ref 150.0–400.0)
RBC: 3.98 Mil/uL (ref 3.87–5.11)
RDW: 13.1 % (ref 11.5–15.5)
WBC: 8.4 10*3/uL (ref 4.0–10.5)

## 2018-02-18 LAB — VITAMIN D 25 HYDROXY (VIT D DEFICIENCY, FRACTURES): VITD: 38.65 ng/mL (ref 30.00–100.00)

## 2018-02-18 LAB — COMPREHENSIVE METABOLIC PANEL
ALK PHOS: 96 U/L (ref 39–117)
ALT: 18 U/L (ref 0–35)
AST: 17 U/L (ref 0–37)
Albumin: 4.1 g/dL (ref 3.5–5.2)
BILIRUBIN TOTAL: 0.6 mg/dL (ref 0.2–1.2)
BUN: 47 mg/dL — ABNORMAL HIGH (ref 6–23)
CALCIUM: 9.9 mg/dL (ref 8.4–10.5)
CO2: 25 mEq/L (ref 19–32)
Chloride: 105 mEq/L (ref 96–112)
Creatinine, Ser: 1.6 mg/dL — ABNORMAL HIGH (ref 0.40–1.20)
GFR: 32.67 mL/min — AB (ref >60.00)
Glucose, Bld: 138 mg/dL — ABNORMAL HIGH (ref 70–99)
POTASSIUM: 5 meq/L (ref 3.5–5.1)
Sodium: 141 mEq/L (ref 135–145)
TOTAL PROTEIN: 7.2 g/dL (ref 6.0–8.3)

## 2018-02-18 LAB — URIC ACID: URIC ACID, SERUM: 10 mg/dL — AB (ref 2.4–7.0)

## 2018-02-18 LAB — LDL CHOLESTEROL, DIRECT: Direct LDL: 103 mg/dL

## 2018-02-18 LAB — LIPID PANEL
Cholesterol: 171 mg/dL (ref 0–200)
HDL: 31.5 mg/dL — ABNORMAL LOW (ref >39.00)
NONHDL: 139.03
TRIGLYCERIDES: 246 mg/dL — AB (ref 0.0–149.0)
Total CHOL/HDL Ratio: 5
VLDL: 49.2 mg/dL — ABNORMAL HIGH (ref 0.0–40.0)

## 2018-02-18 LAB — HEMOGLOBIN A1C: HEMOGLOBIN A1C: 8.5 % — AB (ref 4.6–6.5)

## 2018-02-18 NOTE — Patient Instructions (Addendum)
Taylor Castaneda , Thank you for taking time to come for your Medicare Wellness Visit. I appreciate your ongoing commitment to your health goals. Please review the following plan we discussed and let me know if I can assist you in the future.   These are the goals we discussed: Goals    . Increase physical activity     Starting 02/18/2018, I will continue to do water aerobics for 60 minutes 3-4 days per week and to walk at least 1/2 mile daily, weather permitting.        This is a list of the screening recommended for you and due dates:  Health Maintenance  Topic Date Due  . DTaP/Tdap/Td vaccine (1 - Tdap) 02/08/2027*  . Tetanus Vaccine  02/08/2027*  . Hemoglobin A1C  08/19/2018  . Complete foot exam   08/21/2018  . Eye exam for diabetics  02/02/2019  . Flu Shot  Completed  . DEXA scan (bone density measurement)  Completed  . Pneumonia vaccines  Completed  *Topic was postponed. The date shown is not the original due date.   Preventive Care for Adults  A healthy lifestyle and preventive care can promote health and wellness. Preventive health guidelines for adults include the following key practices.  . A routine yearly physical is a good way to check with your health care provider about your health and preventive screening. It is a chance to share any concerns and updates on your health and to receive a thorough exam.  . Visit your dentist for a routine exam and preventive care every 6 months. Brush your teeth twice a day and floss once a day. Good oral hygiene prevents tooth decay and gum disease.  . The frequency of eye exams is based on your age, health, family medical history, use  of contact lenses, and other factors. Follow your health care provider's recommendations for frequency of eye exams.  . Eat a healthy diet. Foods like vegetables, fruits, whole grains, low-fat dairy products, and lean protein foods contain the nutrients you need without too many calories. Decrease your  intake of foods high in solid fats, added sugars, and salt. Eat the right amount of calories for you. Get information about a proper diet from your health care provider, if necessary.  . Regular physical exercise is one of the most important things you can do for your health. Most adults should get at least 150 minutes of moderate-intensity exercise (any activity that increases your heart rate and causes you to sweat) each week. In addition, most adults need muscle-strengthening exercises on 2 or more days a week.  Silver Sneakers may be a benefit available to you. To determine eligibility, you may visit the website: www.silversneakers.com or contact program at (989)669-2537 Mon-Fri between 8AM-8PM.   . Maintain a healthy weight. The body mass index (BMI) is a screening tool to identify possible weight problems. It provides an estimate of body fat based on height and weight. Your health care provider can find your BMI and can help you achieve or maintain a healthy weight.   For adults 20 years and older: ? A BMI below 18.5 is considered underweight. ? A BMI of 18.5 to 24.9 is normal. ? A BMI of 25 to 29.9 is considered overweight. ? A BMI of 30 and above is considered obese.   . Maintain normal blood lipids and cholesterol levels by exercising and minimizing your intake of saturated fat. Eat a balanced diet with plenty of fruit and vegetables. Blood tests  for lipids and cholesterol should begin at age 51 and be repeated every 5 years. If your lipid or cholesterol levels are high, you are over 50, or you are at high risk for heart disease, you may need your cholesterol levels checked more frequently. Ongoing high lipid and cholesterol levels should be treated with medicines if diet and exercise are not working.  . If you smoke, find out from your health care provider how to quit. If you do not use tobacco, please do not start.  . If you choose to drink alcohol, please do not consume more than 2  drinks per day. One drink is considered to be 12 ounces (355 mL) of beer, 5 ounces (148 mL) of wine, or 1.5 ounces (44 mL) of liquor.  . If you are 25-54 years old, ask your health care provider if you should take aspirin to prevent strokes.  . Use sunscreen. Apply sunscreen liberally and repeatedly throughout the day. You should seek shade when your shadow is shorter than you. Protect yourself by wearing long sleeves, pants, a wide-brimmed hat, and sunglasses year round, whenever you are outdoors.  . Once a month, do a whole body skin exam, using a mirror to look at the skin on your back. Tell your health care provider of new moles, moles that have irregular borders, moles that are larger than a pencil eraser, or moles that have changed in shape or color.

## 2018-02-18 NOTE — Progress Notes (Signed)
PCP notes:   Health maintenance:  Flu vaccine - administered  Abnormal screenings:   None  Patient concerns:   None  Nurse concerns:  None  Next PCP appt:   02/20/18 @ 0930

## 2018-02-18 NOTE — Progress Notes (Signed)
Subjective:   Taylor Castaneda is a 84 y.o. female who presents for Medicare Annual (Subsequent) preventive examination.  Review of Systems:  N/A Cardiac Risk Factors include: advanced age (>85men, >78 women);diabetes mellitus;dyslipidemia;hypertension     Objective:     Vitals: BP 124/80 (BP Location: Right Arm, Patient Position: Sitting, Cuff Size: Normal)   Pulse 72   Temp 98 F (36.7 C) (Oral)   Ht 4' 11.5" (1.511 m) Comment: no shoes  Wt 134 lb (60.8 kg)   SpO2 98%   BMI 26.61 kg/m   Body mass index is 26.61 kg/m.  Advanced Directives 02/18/2018 02/07/2017 01/06/2017 12/17/2016 12/11/2016 08/09/2016  Does Patient Have a Medical Advance Directive? Yes Yes Yes Yes Yes Yes  Type of Paramedic of [place];Living will [place];Living will Healthcare Power of [place]  Does patient want to make changes to medical advance directive? - - - No - Patient declined - -  Copy of [place] in Chart? No - copy requested Yes - Yes Yes No - copy requested    Tobacco Social History   Tobacco Use  Smoking Status Former Smoker  . Years: 2.00  . Types: Cigarettes  . Last attempt to quit: 06/04/1973  . Years since quitting: 44.7  Smokeless Tobacco Never Used  Tobacco Comment   Smoked for 5 years 1965-1970 up to 1/2 pp week     Counseling given: No Comment: Smoked for 5 years 1965-1970 up to 1/2 pp week   Clinical Intake:  Pre-visit preparation completed: Yes  Pain : No/denies pain Pain Score: 0-No pain     Nutritional Status: BMI 25 -29 Overweight Nutritional Risks: None Diabetes: Yes CBG done?: No Did pt. bring in CBG monitor from home?: No  How often do you need to have someone help you when you read instructions, pamphlets, or other written materials from your doctor or pharmacy?: 1 - Never What is the last grade level  you completed in school?: 12th grade  Interpreter Needed?: No  Comments: pt is a widow and lives alone Information entered by :: LPinson, LPN  Past Medical History:  Diagnosis Date  . Anemia   . Diabetes mellitus, type II ([place])   . Glaucoma    Dr.Hecker  . History of colon cancer 1998   Dr. Lennie Hummer  . Hyperlipemia   . Osteopenia 02/2012, 03/2014   DEXA hip -2.2  . Renal insufficiency   . Stenosing tenosynovitis of finger of left hand 2016   index - s/p steroid injection x2   Past Surgical History:  Procedure Laterality Date  . ABI  11/2013   WNL  . APPENDECTOMY    . CARPAL TUNNEL RELEASE     Bilateral   . CATARACT EXTRACTION Bilateral 2006   Implants  . COLECTOMY  1998   For cancer  . COLONOSCOPY  2008   Dr.Stark, Due 2011  . COLONOSCOPY  06/2013   1 hyperplastic polyp, ileocecal anastomosis [facility])  . dexa  02/2012, 03/2014   T score -2.2 at hip overall stable  . REFRACTIVE SURGERY    . TONSILLECTOMY AND ADENOIDECTOMY    . TOTAL KNEE ARTHROPLASTY Left 12/17/2016   Procedure: LEFT TOTAL KNEE ARTHROPLASTY;  Surgeon: Gaynelle Arabian, MD;  Location: [facility];  Service: Orthopedics;  Laterality: Left;   Family History  Problem Relation Age of Onset  . Heart attack Father 29  .  Stroke Father 63  . Diabetes Father   . Hypertension Father   . Heart disease Father        before age 63  . Breast cancer Mother   . Cancer Mother   . Diabetes Sister   . Cancer Sister   . Peripheral vascular disease Sister   . Breast cancer Sister   . Hypertension Son   . COPD Neg Hx   . Asthma Neg Hx   . Colon cancer Neg Hx   . Esophageal cancer Neg Hx   . Rectal cancer Neg Hx   . Stomach cancer Neg Hx    Social History   Socioeconomic History  . Marital status: Widowed    Spouse name: Not on file  . Number of children: 4  . Years of education: Not on file  . Highest education level: Not on file  Occupational History  . Occupation: retired    Fish farm manager: Retired    Comment:  Scientist, research (physical sciences)  . Financial resource strain: Not on file  . Food insecurity:    Worry: Not on file    Inability: Not on file  . Transportation needs:    Medical: Not on file    Non-medical: Not on file  Tobacco Use  . Smoking status: Former Smoker    Years: 2.00    Types: Cigarettes    Last attempt to quit: 06/04/1973    Years since quitting: 44.7  . Smokeless tobacco: Never Used  . Tobacco comment: Smoked for 5 years 1965-1970 up to 1/2 pp week  Substance and Sexual Activity  . Alcohol use: Yes    Comment: 1 glass of wine per month  . Drug use: No  . Sexual activity: Never  Lifestyle  . Physical activity:    Days per week: Not on file    Minutes per session: Not on file  . Stress: Not on file  Relationships  . Social connections:    Talks on phone: Not on file    Gets together: Not on file    Attends religious service: Not on file    Active member of club or organization: Not on file    Attends meetings of clubs or organizations: Not on file    Relationship status: Not on file  Other Topics Concern  . Not on file  Social History Narrative   Lives alone, no pets   Neighbors keep an eye on each other - 12 unit condo.    Family nearby.   Occ: retired Regulatory affairs officer   Activity: walking   Diet: good water, fruits/vegetables daily    Outpatient Encounter Medications as of 02/18/2018  Medication Sig  . atorvastatin (LIPITOR) 40 MG tablet TAKE 1/2 (ONE-HALF) TABLET BY MOUTH ONCE DAILY  . B-D ULTRAFINE III SHORT PEN 31G X 8 MM MISC USE AS DIRECTED WITH INSULIN  . Carboxymethylcellul-Glycerin (CLEAR EYES FOR DRY EYES) 1-0.25 % SOLN Place 1-2 drops into both eyes 3 (three) times daily as needed (for dry/irritated eyes.).  Marland Kitchen carvedilol (COREG) 25 MG tablet Take 12.5 mg by mouth 2 (two) times daily.  . Cholecalciferol (VITAMIN D3 PO) Take 2,500 Units by mouth daily.  . colchicine-probenecid 0.5-500 MG tablet Take 1 tablet by mouth 2 (two) times daily as needed.  .  furosemide (LASIX) 40 MG tablet Take 0.5 tablets (20 mg total) by mouth daily.  Marland Kitchen glucose blood (FREESTYLE LITE) test strip Use as instructed (Patient taking differently: 1 each by Other route 4 (four) times  daily. Use as instructed)  . HUMALOG KWIKPEN 100 UNIT/ML KiwkPen Inject 10 Units into the skin 3 (three) times daily before meals.  Marland Kitchen KLOR-CON M10 10 MEQ tablet TAKE ONE TABLET BY MOUTH ON MONDAY, WEDNESDAY, AND FRIDAY  . LANTUS SOLOSTAR 100 UNIT/ML Solostar Pen Inject 30 Units into the skin at bedtime.  . ramipril (ALTACE) 2.5 MG capsule Take 1 capsule (2.5 mg total) by mouth daily.   No facility-administered encounter medications on file as of 02/18/2018.     Activities of Daily Living In your present state of health, do you have any difficulty performing the following activities: 02/18/2018  Hearing? N  Vision? N  Difficulty concentrating or making decisions? N  Walking or climbing stairs? N  Dressing or bathing? N  Doing errands, shopping? N  Preparing Food and eating ? N  Using the Toilet? N  In the past six months, have you accidently leaked urine? N  Do you have problems with loss of bowel control? N  Managing your Medications? N  Managing your Finances? N  Housekeeping or managing your Housekeeping? N  Some recent data might be hidden    Patient Care Team: [name], MD as PCP - General (Family Medicine) Jacelyn Pi, MD as Consulting Physician (Endocrinology)    Assessment:   This is a routine wellness examination for Ladawna.  Hearing Screening Comments: Hearing exam - not completed Vision Screening Comments: Vision exam in Aug 2019  Exercise Activities and Dietary recommendations Current Exercise Habits: Structured exercise class;Home exercise routine, Type of exercise: walking;Other - see comments(water aerobics 60 min 3-4x/wk; walking 1/2 mile daily), Time (Minutes): 60, Frequency (Times/Week): 4, Weekly Exercise (Minutes/Week): 240, Intensity: Moderate,  Exercise limited by: None identified  Goals    . Increase physical activity     Starting 02/18/2018, I will continue to do water aerobics for 60 minutes 3-4 days per week and to walk at least 1/2 mile daily, weather permitting.        Fall Risk Fall Risk  02/18/2018 02/07/2017 01/30/2016 03/10/2014  Falls in the past year? No Yes No No  Comment - pt reports she blacked out and fell causing bruising to left foot - -  Number falls in past yr: - 1 - -  Injury with Fall? - Yes - -   Depression Screen PHQ 2/9 Scores 02/18/2018 02/07/2017 01/30/2016 03/10/2014  PHQ - 2 Score 0 0 0 0  PHQ- 9 Score 0 1 - -     Cognitive Function MMSE - Mini Mental State Exam 02/18/2018 02/07/2017  Orientation to time 5 5  Orientation to Place 5 5  Registration 3 3  Attention/ Calculation 0 0  Recall 3 3  Language- name 2 objects 0 0  Language- repeat 1 1  Language- follow 3 step command 3 3  Language- read & follow direction 0 0  Write a sentence 0 0  Copy design 0 0  Total score 20 20       PLEASE NOTE: A Mini-Cog screen was completed. Maximum score is 20. A value of 0 denotes this part of Folstein MMSE was not completed or the patient failed this part of the Mini-Cog screening.   Mini-Cog Screening Orientation to Time - Max 5 pts Orientation to Place - Max 5 pts Registration - Max 3 pts Recall - Max 3 pts Language Repeat - Max 1 pts Language Follow 3 Step Command - Max 3 pts   Immunization History  Administered Date(s) Administered  . Influenza  Split 03/16/2011, 03/12/2012  . Influenza Whole 06/04/2002, 04/24/2007, 03/10/2008, 03/31/2009, 04/18/2010  . Influenza, High Dose Seasonal PF 03/18/2013  . Influenza,inj,Quad PF,6+ Mos 03/10/2014, 01/30/2016, 02/07/2017, 02/18/2018  . Pneumococcal Conjugate-13 03/10/2014  . Pneumococcal Polysaccharide-23 03/23/2011  . Td 06/08/1996  . Zoster 06/05/2007    Screening Tests Health Maintenance  Topic Date Due  . DTaP/Tdap/Td (1 - Tdap) 02/08/2027  (Originally 06/09/1996)  . TETANUS/TDAP  02/08/2027 (Originally 06/08/2006)  . HEMOGLOBIN A1C  08/19/2018  . FOOT EXAM  08/21/2018  . OPHTHALMOLOGY EXAM  02/02/2019  . INFLUENZA VACCINE  Completed  . DEXA SCAN  Completed  . PNA vac Low Risk Adult  Completed      Plan:   I have personally reviewed, addressed, and noted the following in the patient's chart:  A. Medical and social history B. Use of alcohol, tobacco or illicit drugs  C. Current medications and supplements D. Functional ability and status E.  Nutritional status F.  Physical activity G. Advance directives H. List of other physicians I.  Hospitalizations, surgeries, and ER visits in previous 12 months J.  [place] to include hearing, vision, cognitive, depression L. Referrals and appointments - none  In addition, I have reviewed and discussed with patient certain preventive protocols, quality metrics, and best practice recommendations. A written personalized care plan for preventive services as well as general preventive health recommendations were provided to patient.  See attached scanned questionnaire for additional information.   Signed,   Lindell Noe, MHA, BS, LPN Health Coach

## 2018-02-20 ENCOUNTER — Ambulatory Visit (INDEPENDENT_AMBULATORY_CARE_PROVIDER_SITE_OTHER): Payer: Medicare Other | Admitting: Family Medicine

## 2018-02-20 ENCOUNTER — Encounter: Payer: Self-pay | Admitting: Family Medicine

## 2018-02-20 VITALS — BP 130/62 | HR 62 | Temp 97.8°F | Ht 59.5 in | Wt 135.2 lb

## 2018-02-20 DIAGNOSIS — Z7189 Other specified counseling: Secondary | ICD-10-CM | POA: Diagnosis not present

## 2018-02-20 DIAGNOSIS — M109 Gout, unspecified: Secondary | ICD-10-CM

## 2018-02-20 DIAGNOSIS — Z794 Long term (current) use of insulin: Secondary | ICD-10-CM

## 2018-02-20 DIAGNOSIS — I10 Essential (primary) hypertension: Secondary | ICD-10-CM

## 2018-02-20 DIAGNOSIS — E785 Hyperlipidemia, unspecified: Secondary | ICD-10-CM

## 2018-02-20 DIAGNOSIS — E1122 Type 2 diabetes mellitus with diabetic chronic kidney disease: Secondary | ICD-10-CM

## 2018-02-20 DIAGNOSIS — I739 Peripheral vascular disease, unspecified: Secondary | ICD-10-CM | POA: Diagnosis not present

## 2018-02-20 DIAGNOSIS — M8580 Other specified disorders of bone density and structure, unspecified site: Secondary | ICD-10-CM

## 2018-02-20 DIAGNOSIS — E1121 Type 2 diabetes mellitus with diabetic nephropathy: Secondary | ICD-10-CM

## 2018-02-20 DIAGNOSIS — N183 Chronic kidney disease, stage 3 (moderate): Secondary | ICD-10-CM

## 2018-02-20 DIAGNOSIS — Z0000 Encounter for general adult medical examination without abnormal findings: Secondary | ICD-10-CM

## 2018-02-20 NOTE — Assessment & Plan Note (Signed)
Chronic adequate control.

## 2018-02-20 NOTE — Assessment & Plan Note (Signed)
Advanced directive - scanned and in chart 02/2018. HCPOA are [name] (daughter) then Herma Carson. Does not want prolonged life support if terminal condition.

## 2018-02-20 NOTE — Assessment & Plan Note (Signed)
Denies frequent flares. She has colchicine-probenecid PRN.

## 2018-02-20 NOTE — Assessment & Plan Note (Signed)
Chronic, trig elevated and LDL above goal. Encouraged healthy diet choices to help control cholesterol levels.  The ASCVD Risk score Taylor Bussing DC Jr., et al., 2013) failed to calculate for the following reasons:   The 2013 ASCVD risk score is only valid for ages 19 to 73

## 2018-02-20 NOTE — Assessment & Plan Note (Signed)
Chronic, deteriorated. Encouraged increased water intake (poor in general).

## 2018-02-20 NOTE — Assessment & Plan Note (Signed)
Chronic. Sees Dr Chalmers Cater. A1c elevated - encouraged closer adherence to diabetic diet.

## 2018-02-20 NOTE — Assessment & Plan Note (Signed)
Endorses good dietary calcium intake.  Continue regular walking routine and vitamin D.

## 2018-02-20 NOTE — Progress Notes (Signed)
BP 130/62 (BP Location: Left Arm, Patient Position: Sitting, Cuff Size: Normal)   Pulse 62   Temp 97.8 F (36.6 C) (Oral)   Ht 4' 11.5" (1.511 m)   Wt 135 lb 4 oz (61.3 kg)   SpO2 100%   BMI 26.86 kg/m    Hearing Screening   125Hz  250Hz  500Hz  1000Hz  2000Hz  3000Hz  4000Hz  6000Hz  8000Hz   Right ear:   40 40 20  20    Left ear:   40 40 20  40      CC: CPE Subjective:    Patient ID: Taylor Castaneda, female    DOB: Nov 07, 1932, 84 y.o.   MRN: [id number]  HPI: Taylor Castaneda is a 84 y.o. female presenting on 02/20/2018 for Annual Exam (Pt 2.)   Saw Katha Cabal this week for medicare wellness visit. Note reviewed.    Preventative: COLONOSCOPY Date: 06/2013 1 hyperplastic polyp, ileocecal anastomosis [facility])  Well woman with OBGYN Dr Stann Mainland @ [facility] - last mammogram 2015. Last seen 2015. Declines return. She does breast exams at home. suggested return to [facility] for mammo. DEXA - Date: 04/2014 T score stable at -2.2 at hip. Calcium stopped by endo. Good calcium in diet. Walks regularly.  Flu yearly Pneumovax 2012, prevnar 2015 Td 1998.  zostavax 2009 shingrix - discussed  Advanced directive - scanned and in chart 02/2018. HCPOA are [name] (daughter) then Herma Carson (son). Does not want prolonged life support if terminal condition.  Seat belt use discussed Sunscreen use discussed (doesn't use). No changing moles on skin. Ex smoker - quit 1975 Alcohol - 1 glass wine/week Dentist not regularly - has dentures Eye exam Q6 mo  Lives alone, no pets Neighbors keep an eye on each other - 12 unit condo.  Family nearby. Occ: retired Regulatory affairs officer Activity: walking, daily water aerobics at [employer] Diet: good water, fruits/vegetables daily  Relevant past medical, surgical, family and social history reviewed and updated as indicated. Interim medical history since our last visit reviewed. Allergies and medications reviewed and updated. Outpatient Medications Prior to Visit  Medication Sig  Dispense Refill  . atorvastatin (LIPITOR) 40 MG tablet TAKE 1/2 (ONE-HALF) TABLET BY MOUTH ONCE DAILY 45 tablet 1  . B-D ULTRAFINE III SHORT PEN 31G X 8 MM MISC USE AS DIRECTED WITH INSULIN 100 each 3  . Carboxymethylcellul-Glycerin (CLEAR EYES FOR DRY EYES) 1-0.25 % SOLN Place 1-2 drops into both eyes 3 (three) times daily as needed (for dry/irritated eyes.).    Marland Kitchen carvedilol (COREG) 25 MG tablet Take 12.5 mg by mouth 2 (two) times daily.  2  . Cholecalciferol (VITAMIN D3 PO) Take 2,500 Units by mouth daily.    . colchicine-probenecid 0.5-500 MG tablet Take 1 tablet by mouth 2 (two) times daily as needed. 60 tablet 0  . furosemide (LASIX) 40 MG tablet Take 0.5 tablets (20 mg total) by mouth daily. 45 tablet 0  . glucose blood (FREESTYLE LITE) test strip Use as instructed (Patient taking differently: 1 each by Other route 4 (four) times daily. Use as instructed) 100 each 2  . HUMALOG KWIKPEN 100 UNIT/ML KiwkPen Inject 10 Units into the skin 3 (three) times daily before meals.  4  . KLOR-CON M10 10 MEQ tablet TAKE ONE TABLET BY MOUTH ON MONDAY, WEDNESDAY, AND FRIDAY 36 tablet 3  . LANTUS SOLOSTAR 100 UNIT/ML Solostar Pen Inject 30 Units into the skin at bedtime.    . ramipril (ALTACE) 2.5 MG capsule Take 1 capsule (2.5 mg total)  by mouth daily. 30 capsule 6   No facility-administered medications prior to visit.      Per HPI unless specifically indicated in ROS section below Review of Systems  Constitutional: Negative for activity change, appetite change, chills, fatigue, fever and unexpected weight change.  HENT: Negative for hearing loss.   Eyes: Negative for visual disturbance.  Respiratory: Negative for cough, chest tightness, shortness of breath and wheezing.   Cardiovascular: Negative for chest pain, palpitations and leg swelling.  Gastrointestinal: Negative for abdominal distention, abdominal pain, blood in stool, constipation, diarrhea, nausea and vomiting.  Genitourinary: Negative for  difficulty urinating and hematuria.  Musculoskeletal: Negative for arthralgias, myalgias and neck pain.  Skin: Negative for rash.  Neurological: Negative for dizziness, seizures, syncope and headaches.  Hematological: Negative for adenopathy. Does not bruise/bleed easily.  Psychiatric/Behavioral: Negative for dysphoric mood. The patient is not nervous/anxious.        Objective:    BP 130/62 (BP Location: Left Arm, Patient Position: Sitting, Cuff Size: Normal)   Pulse 62   Temp 97.8 F (36.6 C) (Oral)   Ht 4' 11.5" (1.511 m)   Wt 135 lb 4 oz (61.3 kg)   SpO2 100%   BMI 26.86 kg/m   Wt Readings from Last 3 Encounters:  02/20/18 135 lb 4 oz (61.3 kg)  02/18/18 134 lb (60.8 kg)  08/20/17 135 lb (61.2 kg)    Physical Exam  Constitutional: She is oriented to person, place, and time. She appears well-developed and well-nourished. No distress.  HENT:  Head: Normocephalic and atraumatic.  Right Ear: Hearing, tympanic membrane, external ear and ear canal normal.  Left Ear: Hearing, tympanic membrane, external ear and ear canal normal.  Nose: Nose normal.  Mouth/Throat: Uvula is midline, oropharynx is clear and moist and mucous membranes are normal. No oropharyngeal exudate, posterior oropharyngeal edema or posterior oropharyngeal erythema.  Eyes: Pupils are equal, round, and reactive to light. Conjunctivae and EOM are normal. No scleral icterus.  Neck: Normal range of motion. Neck supple. No thyromegaly present.  Cardiovascular: Normal rate, regular rhythm, normal heart sounds and intact distal pulses.  No murmur heard. Pulses:      Radial pulses are 2+ on the right side, and 2+ on the left side.  Pulmonary/Chest: Effort normal and breath sounds normal. No respiratory distress. She has no wheezes. She has no rales.  Abdominal: Soft. Bowel sounds are normal. She exhibits no distension and no mass. There is no tenderness. There is no rebound and no guarding.  Musculoskeletal: Normal  range of motion. She exhibits no edema.  Lymphadenopathy:    She has no cervical adenopathy.  Neurological: She is alert and oriented to person, place, and time.  CN grossly intact, station and gait intact  Skin: Skin is warm and dry. No rash noted.  Psychiatric: She has a normal mood and affect. Her behavior is normal. Judgment and thought content normal.  Nursing note and vitals reviewed.  Results for orders placed or performed in visit on 02/18/18  VITAMIN D 25 Hydroxy (Vit-D Deficiency, Fractures)  Result Value Ref Range   VITD 38.65 30.00 - 100.00 ng/mL  Uric acid  Result Value Ref Range   Uric Acid, Serum 10.0 (H) 2.4 - 7.0 mg/dL  CBC with Differential/Platelet  Result Value Ref Range   WBC 8.4 4.0 - 10.5 K/uL   RBC 3.98 3.87 - 5.11 Mil/uL   Hemoglobin 12.8 12.0 - 15.0 g/dL   HCT 37.8 36.0 - 46.0 %   MCV  95.0 78.0 - 100.0 fl   MCHC 33.7 30.0 - 36.0 g/dL   RDW 13.1 11.5 - 15.5 %   Platelets 156.0 150.0 - 400.0 K/uL   Neutrophils Relative % 53.6 43.0 - 77.0 %   Lymphocytes Relative 31.8 12.0 - 46.0 %   Monocytes Relative 11.2 3.0 - 12.0 %   Eosinophils Relative 2.4 0.0 - 5.0 %   Basophils Relative 1.0 0.0 - 3.0 %   Neutro Abs 4.5 1.4 - 7.7 K/uL   Lymphs Abs 2.7 0.7 - 4.0 K/uL   Monocytes Absolute 0.9 0.1 - 1.0 K/uL   Eosinophils Absolute 0.2 0.0 - 0.7 K/uL   Basophils Absolute 0.1 0.0 - 0.1 K/uL  Hemoglobin A1c  Result Value Ref Range   Hgb A1c MFr Bld 8.5 (H) 4.6 - 6.5 %  Comprehensive metabolic panel  Result Value Ref Range   Sodium 141 135 - 145 mEq/L   Potassium 5.0 3.5 - 5.1 mEq/L   Chloride 105 96 - 112 mEq/L   CO2 25 19 - 32 mEq/L   Glucose, Bld 138 (H) 70 - 99 mg/dL   BUN 47 (H) 6 - 23 mg/dL   Creatinine, Ser 1.60 (H) 0.40 - 1.20 mg/dL   Total Bilirubin 0.6 0.2 - 1.2 mg/dL   Alkaline Phosphatase 96 39 - 117 U/L   AST 17 0 - 37 U/L   ALT 18 0 - 35 U/L   Total Protein 7.2 6.0 - 8.3 g/dL   Albumin 4.1 3.5 - 5.2 g/dL   Calcium 9.9 8.4 - 10.5 mg/dL   GFR  32.67 (L) >60.00 mL/min  Lipid panel  Result Value Ref Range   Cholesterol 171 0 - 200 mg/dL   Triglycerides 246.0 (H) 0.0 - 149.0 mg/dL   HDL 31.50 (L) >39.00 mg/dL   VLDL 49.2 (H) 0.0 - 40.0 mg/dL   Total CHOL/HDL Ratio 5    NonHDL 139.03   LDL cholesterol, direct  Result Value Ref Range   Direct LDL 103.0 mg/dL      Assessment & Plan:   Problem List Items Addressed This Visit    PAD (peripheral artery disease) (HCC)   Osteopenia    Endorses good dietary calcium intake.  Continue regular walking routine and vitamin D.       HLD (hyperlipidemia)    Chronic, trig elevated and LDL above goal. Encouraged healthy diet choices to help control cholesterol levels.  The ASCVD Risk score Mikey Bussing DC Jr., et al., 2013) failed to calculate for the following reasons:   The 2013 ASCVD risk score is only valid for ages 36 to 75       Health maintenance examination - Primary    Preventative protocols reviewed and updated unless pt declined. Discussed healthy diet and lifestyle.       Gout    Denies frequent flares. She has colchicine-probenecid PRN.       Essential hypertension    Chronic adequate control.       Controlled type 2 diabetes mellitus with diabetic nephropathy, with long-term current use of insulin (HCC)    Chronic. Sees Dr Chalmers Cater. A1c elevated - encouraged closer adherence to diabetic diet.       CKD stage 3 due to type 2 diabetes mellitus (HCC)    Chronic, deteriorated. Encouraged increased water intake (poor in general).       Advanced care planning/counseling discussion    Advanced directive - scanned and in chart 02/2018. HCPOA are [name] (daughter) then Herma Carson.  Does not want prolonged life support if terminal condition.          No orders of the defined types were placed in this encounter.  No orders of the defined types were placed in this encounter.   Follow up plan: Return in about 6 months (around 08/21/2018) for follow up  visit.  [name], MD

## 2018-02-20 NOTE — Assessment & Plan Note (Signed)
Preventative protocols reviewed and updated unless pt declined. Discussed healthy diet and lifestyle.  

## 2018-02-20 NOTE — Patient Instructions (Addendum)
You are doing well today.  Consider calling [facility] for repeat mammogram.  If interested, check with pharmacy about new 2 shot shingles series (shingrix).  Cholesterol and sugar were a bit elevated as was kidney function - increase water intake.  Return as needed or in 6 months for follow up visit.  Health Maintenance, Female Adopting a healthy lifestyle and getting preventive care can go a long way to promote health and wellness. Talk with your health care provider about what schedule of regular examinations is right for you. This is a good chance for you to check in with your provider about disease prevention and staying healthy. In between checkups, there are plenty of things you can do on your own. Experts have done a lot of research about which lifestyle changes and preventive measures are most likely to keep you healthy. Ask your health care provider for more information. Weight and diet Eat a healthy diet  Be sure to include plenty of vegetables, fruits, low-fat dairy products, and lean protein.  Do not eat a lot of foods high in solid fats, added sugars, or salt.  Get regular exercise. This is one of the most important things you can do for your health. ? Most adults should exercise for at least 150 minutes each week. The exercise should increase your heart rate and make you sweat (moderate-intensity exercise). ? Most adults should also do strengthening exercises at least twice a week. This is in addition to the moderate-intensity exercise.  Maintain a healthy weight  Body mass index (BMI) is a measurement that can be used to identify possible weight problems. It estimates body fat based on height and weight. Your health care provider can help determine your BMI and help you achieve or maintain a healthy weight.  For females 68 years of age and older: ? A BMI below 18.5 is considered underweight. ? A BMI of 18.5 to 24.9 is normal. ? A BMI of 25 to 29.9 is considered overweight. ? A  BMI of 30 and above is considered obese.  Watch levels of cholesterol and blood lipids  You should start having your blood tested for lipids and cholesterol at 82 years of age, then have this test every 5 years.  You may need to have your cholesterol levels checked more often if: ? Your lipid or cholesterol levels are high. ? You are older than 82 years of age. ? You are at high risk for heart disease.  Cancer screening Lung Cancer  Lung cancer screening is recommended for adults 70-56 years old who are at high risk for lung cancer because of a history of smoking.  A yearly low-dose CT scan of the lungs is recommended for people who: ? Currently smoke. ? Have quit within the past 15 years. ? Have at least a 30-pack-year history of smoking. A pack year is smoking an average of one pack of cigarettes a day for 1 year.  Yearly screening should continue until it has been 15 years since you quit.  Yearly screening should stop if you develop a health problem that would prevent you from having lung cancer treatment.  Breast Cancer  Practice breast self-awareness. This means understanding how your breasts normally appear and feel.  It also means doing regular breast self-exams. Let your health care provider know about any changes, no matter how small.  If you are in your 20s or 30s, you should have a clinical breast exam (CBE) by a health care provider every 1-3 years  as part of a regular health exam.  If you are 54 or older, have a CBE every year. Also consider having a breast X-ray (mammogram) every year.  If you have a family history of breast cancer, talk to your health care provider about genetic screening.  If you are at high risk for breast cancer, talk to your health care provider about having an MRI and a mammogram every year.  Breast cancer gene (BRCA) assessment is recommended for women who have family members with BRCA-related cancers. BRCA-related cancers  include: ? Breast. ? Ovarian. ? Tubal. ? Peritoneal cancers.  Results of the assessment will determine the need for genetic counseling and BRCA1 and BRCA2 testing.  Cervical Cancer Your health care provider may recommend that you be screened regularly for cancer of the pelvic organs (ovaries, uterus, and vagina). This screening involves a pelvic examination, including checking for microscopic changes to the surface of your cervix (Pap test). You may be encouraged to have this screening done every 3 years, beginning at age 69.  For women ages 40-65, health care providers may recommend pelvic exams and Pap testing every 3 years, or they may recommend the Pap and pelvic exam, combined with testing for human papilloma virus (HPV), every 5 years. Some types of HPV increase your risk of cervical cancer. Testing for HPV may also be done on women of any age with unclear Pap test results.  Other health care providers may not recommend any screening for nonpregnant women who are considered low risk for pelvic cancer and who do not have symptoms. Ask your health care provider if a screening pelvic exam is right for you.  If you have had past treatment for cervical cancer or a condition that could lead to cancer, you need Pap tests and screening for cancer for at least 20 years after your treatment. If Pap tests have been discontinued, your risk factors (such as having a new sexual partner) need to be reassessed to determine if screening should resume. Some women have medical problems that increase the chance of getting cervical cancer. In these cases, your health care provider may recommend more frequent screening and Pap tests.  Colorectal Cancer  This type of cancer can be detected and often prevented.  Routine colorectal cancer screening usually begins at 82 years of age and continues through 82 years of age.  Your health care provider may recommend screening at an earlier age if you have risk factors  for colon cancer.  Your health care provider may also recommend using home test kits to check for hidden blood in the stool.  A small camera at the end of a tube can be used to examine your colon directly (sigmoidoscopy or colonoscopy). This is done to check for the earliest forms of colorectal cancer.  Routine screening usually begins at age 60.  Direct examination of the colon should be repeated every 5-10 years through 82 years of age. However, you may need to be screened more often if early forms of precancerous polyps or small growths are found.  Skin Cancer  Check your skin from head to toe regularly.  Tell your health care provider about any new moles or changes in moles, especially if there is a change in a mole's shape or color.  Also tell your health care provider if you have a mole that is larger than the size of a pencil eraser.  Always use sunscreen. Apply sunscreen liberally and repeatedly throughout the day.  Protect yourself by  wearing long sleeves, pants, a wide-brimmed hat, and sunglasses whenever you are outside.  Heart disease, diabetes, and high blood pressure  High blood pressure causes heart disease and increases the risk of stroke. High blood pressure is more likely to develop in: ? People who have blood pressure in the high end of the normal range (130-139/85-89 mm Hg). ? People who are overweight or obese. ? People who are African American.  If you are 48-51 years of age, have your blood pressure checked every 3-5 years. If you are 17 years of age or older, have your blood pressure checked every year. You should have your blood pressure measured twice-once when you are at a hospital or clinic, and once when you are not at a hospital or clinic. Record the average of the two measurements. To check your blood pressure when you are not at a hospital or clinic, you can use: ? An automated blood pressure machine at a pharmacy. ? A home blood pressure monitor.  If  you are between 67 years and 58 years old, ask your health care provider if you should take aspirin to prevent strokes.  Have regular diabetes screenings. This involves taking a blood sample to check your fasting blood sugar level. ? If you are at a normal weight and have a low risk for diabetes, have this test once every three years after 82 years of age. ? If you are overweight and have a high risk for diabetes, consider being tested at a younger age or more often. Preventing infection Hepatitis B  If you have a higher risk for hepatitis B, you should be screened for this virus. You are considered at high risk for hepatitis B if: ? You were born in a country where hepatitis B is common. Ask your health care provider which countries are considered high risk. ? Your parents were born in a high-risk country, and you have not been immunized against hepatitis B (hepatitis B vaccine). ? You have HIV or AIDS. ? You use needles to inject street drugs. ? You live with someone who has hepatitis B. ? You have had sex with someone who has hepatitis B. ? You get hemodialysis treatment. ? You take certain medicines for conditions, including cancer, organ transplantation, and autoimmune conditions.  Hepatitis C  Blood testing is recommended for: ? Everyone born from [birth year] through 1963. ? Anyone with known risk factors for hepatitis C.  Sexually transmitted infections (STIs)  You should be screened for sexually transmitted infections (STIs) including gonorrhea and chlamydia if: ? You are sexually active and are younger than 82 years of age. ? You are older than 82 years of age and your health care provider tells you that you are at risk for this type of infection. ? Your sexual activity has changed since you were last screened and you are at an increased risk for chlamydia or gonorrhea. Ask your health care provider if you are at risk.  If you do not have HIV, but are at risk, it may be recommended  that you take a prescription medicine daily to prevent HIV infection. This is called pre-exposure prophylaxis (PrEP). You are considered at risk if: ? You are sexually active and do not regularly use condoms or know the HIV status of your partner(s). ? You take drugs by injection. ? You are sexually active with a partner who has HIV.  Talk with your health care provider about whether you are at high risk of being infected with  HIV. If you choose to begin PrEP, you should first be tested for HIV. You should then be tested every 3 months for as long as you are taking PrEP. Pregnancy  If you are premenopausal and you may become pregnant, ask your health care provider about preconception counseling.  If you may become pregnant, take 400 to 800 micrograms (mcg) of folic acid every day.  If you want to prevent pregnancy, talk to your health care provider about birth control (contraception). Osteoporosis and menopause  Osteoporosis is a disease in which the bones lose minerals and strength with aging. This can result in serious bone fractures. Your risk for osteoporosis can be identified using a bone density scan.  If you are 21 years of age or older, or if you are at risk for osteoporosis and fractures, ask your health care provider if you should be screened.  Ask your health care provider whether you should take a calcium or vitamin D supplement to lower your risk for osteoporosis.  Menopause may have certain physical symptoms and risks.  Hormone replacement therapy may reduce some of these symptoms and risks. Talk to your health care provider about whether hormone replacement therapy is right for you. Follow these instructions at home:  Schedule regular health, dental, and eye exams.  Stay current with your immunizations.  Do not use any tobacco products including cigarettes, chewing tobacco, or electronic cigarettes.  If you are pregnant, do not drink alcohol.  If you are  breastfeeding, limit how much and how often you drink alcohol.  Limit alcohol intake to no more than 1 drink per day for nonpregnant women. One drink equals 12 ounces of beer, 5 ounces of wine, or 1 ounces of hard liquor.  Do not use street drugs.  Do not share needles.  Ask your health care provider for help if you need support or information about quitting drugs.  Tell your health care provider if you often feel depressed.  Tell your health care provider if you have ever been abused or do not feel safe at home. This information is not intended to replace advice given to you by your health care provider. Make sure you discuss any questions you have with your health care provider. Document Released: 12/04/2010 Document Revised: 10/27/2015 Document Reviewed: 02/22/2015 Elsevier Interactive Patient Education  [employer].

## 2018-03-04 NOTE — Progress Notes (Signed)
I reviewed health advisor's note, was available for consultation, and agree with documentation and plan.  

## 2018-03-11 DIAGNOSIS — L578 Other skin changes due to chronic exposure to nonionizing radiation: Secondary | ICD-10-CM | POA: Diagnosis not present

## 2018-03-11 DIAGNOSIS — Z85828 Personal history of other malignant neoplasm of skin: Secondary | ICD-10-CM | POA: Diagnosis not present

## 2018-03-13 ENCOUNTER — Other Ambulatory Visit: Payer: Self-pay | Admitting: Family Medicine

## 2018-03-14 NOTE — Telephone Encounter (Signed)
Last labs and OV 02/2018. Last Rx 12/24/17 #45

## 2018-03-28 DIAGNOSIS — H04123 Dry eye syndrome of bilateral lacrimal glands: Secondary | ICD-10-CM | POA: Diagnosis not present

## 2018-03-28 DIAGNOSIS — H402232 Chronic angle-closure glaucoma, bilateral, moderate stage: Secondary | ICD-10-CM | POA: Diagnosis not present

## 2018-04-15 ENCOUNTER — Other Ambulatory Visit: Payer: Self-pay

## 2018-04-15 MED ORDER — RAMIPRIL 2.5 MG PO CAPS: 2.5000 mg | ORAL_CAPSULE | Freq: Every day | ORAL | 4 refills | Status: DC

## 2018-04-15 NOTE — Telephone Encounter (Signed)
Escribed

## 2018-04-22 DIAGNOSIS — H02403 Unspecified ptosis of bilateral eyelids: Secondary | ICD-10-CM | POA: Diagnosis not present

## 2018-04-22 DIAGNOSIS — H04123 Dry eye syndrome of bilateral lacrimal glands: Secondary | ICD-10-CM | POA: Diagnosis not present

## 2018-04-22 DIAGNOSIS — H402232 Chronic angle-closure glaucoma, bilateral, moderate stage: Secondary | ICD-10-CM | POA: Diagnosis not present

## 2018-05-05 DIAGNOSIS — E1165 Type 2 diabetes mellitus with hyperglycemia: Secondary | ICD-10-CM | POA: Diagnosis not present

## 2018-06-09 ENCOUNTER — Other Ambulatory Visit: Payer: Self-pay | Admitting: Family Medicine

## 2018-06-30 DIAGNOSIS — E1165 Type 2 diabetes mellitus with hyperglycemia: Secondary | ICD-10-CM | POA: Diagnosis not present

## 2018-06-30 DIAGNOSIS — E7800 Pure hypercholesterolemia, unspecified: Secondary | ICD-10-CM | POA: Diagnosis not present

## 2018-07-05 ENCOUNTER — Other Ambulatory Visit: Payer: Self-pay | Admitting: Family Medicine

## 2018-07-07 DIAGNOSIS — E7800 Pure hypercholesterolemia, unspecified: Secondary | ICD-10-CM | POA: Diagnosis not present

## 2018-07-07 DIAGNOSIS — I10 Essential (primary) hypertension: Secondary | ICD-10-CM | POA: Diagnosis not present

## 2018-07-07 DIAGNOSIS — R809 Proteinuria, unspecified: Secondary | ICD-10-CM | POA: Diagnosis not present

## 2018-07-07 DIAGNOSIS — E1165 Type 2 diabetes mellitus with hyperglycemia: Secondary | ICD-10-CM | POA: Diagnosis not present

## 2018-08-21 ENCOUNTER — Ambulatory Visit: Payer: Medicare Other | Admitting: Family Medicine

## 2018-08-25 ENCOUNTER — Other Ambulatory Visit: Payer: Self-pay | Admitting: Family Medicine

## 2018-09-11 ENCOUNTER — Ambulatory Visit: Payer: Medicare Other | Admitting: Family Medicine

## 2018-09-28 ENCOUNTER — Other Ambulatory Visit: Payer: Self-pay | Admitting: Family Medicine

## 2018-10-06 DIAGNOSIS — E1165 Type 2 diabetes mellitus with hyperglycemia: Secondary | ICD-10-CM | POA: Diagnosis not present

## 2018-10-06 DIAGNOSIS — E7800 Pure hypercholesterolemia, unspecified: Secondary | ICD-10-CM | POA: Diagnosis not present

## 2018-10-13 DIAGNOSIS — E1165 Type 2 diabetes mellitus with hyperglycemia: Secondary | ICD-10-CM | POA: Diagnosis not present

## 2018-10-13 DIAGNOSIS — R809 Proteinuria, unspecified: Secondary | ICD-10-CM | POA: Diagnosis not present

## 2018-10-13 DIAGNOSIS — I10 Essential (primary) hypertension: Secondary | ICD-10-CM | POA: Diagnosis not present

## 2018-10-13 DIAGNOSIS — E7800 Pure hypercholesterolemia, unspecified: Secondary | ICD-10-CM | POA: Diagnosis not present

## 2018-11-25 ENCOUNTER — Encounter: Payer: Self-pay | Admitting: Family Medicine

## 2018-11-25 ENCOUNTER — Other Ambulatory Visit: Payer: Self-pay

## 2018-11-25 ENCOUNTER — Ambulatory Visit (INDEPENDENT_AMBULATORY_CARE_PROVIDER_SITE_OTHER): Payer: Medicare Other | Admitting: Family Medicine

## 2018-11-25 VITALS — BP 136/62 | HR 74 | Temp 97.6°F | Ht 59.5 in | Wt 123.1 lb

## 2018-11-25 DIAGNOSIS — I739 Peripheral vascular disease, unspecified: Secondary | ICD-10-CM | POA: Diagnosis not present

## 2018-11-25 DIAGNOSIS — N183 Chronic kidney disease, stage 3 unspecified: Secondary | ICD-10-CM

## 2018-11-25 DIAGNOSIS — R252 Cramp and spasm: Secondary | ICD-10-CM

## 2018-11-25 DIAGNOSIS — E1122 Type 2 diabetes mellitus with diabetic chronic kidney disease: Secondary | ICD-10-CM

## 2018-11-25 DIAGNOSIS — Z794 Long term (current) use of insulin: Secondary | ICD-10-CM | POA: Diagnosis not present

## 2018-11-25 DIAGNOSIS — M109 Gout, unspecified: Secondary | ICD-10-CM | POA: Diagnosis not present

## 2018-11-25 DIAGNOSIS — I10 Essential (primary) hypertension: Secondary | ICD-10-CM

## 2018-11-25 DIAGNOSIS — E1121 Type 2 diabetes mellitus with diabetic nephropathy: Secondary | ICD-10-CM | POA: Diagnosis not present

## 2018-11-25 LAB — RENAL FUNCTION PANEL
Albumin: 3.9 g/dL (ref 3.5–5.2)
BUN: 36 mg/dL — ABNORMAL HIGH (ref 6–23)
CO2: 25 mEq/L (ref 19–32)
Calcium: 9.6 mg/dL (ref 8.4–10.5)
Chloride: 102 mEq/L (ref 96–112)
Creatinine, Ser: 1.37 mg/dL — ABNORMAL HIGH (ref 0.40–1.20)
GFR: 36.69 mL/min — ABNORMAL LOW (ref >60.00)
Glucose, Bld: 168 mg/dL — ABNORMAL HIGH (ref 70–99)
Phosphorus: 3.5 mg/dL (ref 2.3–4.6)
Potassium: 4.8 mEq/L (ref 3.5–5.1)
Sodium: 137 mEq/L (ref 135–145)

## 2018-11-25 LAB — POCT GLYCOSYLATED HEMOGLOBIN (HGB A1C): Hemoglobin A1C: 7.2 % — AB (ref 4.0–5.6)

## 2018-11-25 LAB — URIC ACID: Uric Acid, Serum: 7.7 mg/dL — ABNORMAL HIGH (ref 2.4–7.0)

## 2018-11-25 MED ORDER — COLCHICINE 0.6 MG PO TABS: 0.6000 mg | ORAL_TABLET | Freq: Every day | ORAL | 1 refills | Status: DC | PRN

## 2018-11-25 NOTE — Assessment & Plan Note (Signed)
Currently in acute flare - treat with colchicine 0.6mg  PRN. Update if not improving with treatment. Update urate level today. Consider allopurinol once gout flare has resolved in setting of CKD.

## 2018-11-25 NOTE — Patient Instructions (Signed)
You do have a gout flare.  Take colchicine sent to pharmacy (in place of colchicine-probenecid. Let us know if not improving with this.  Labs today to check kidneys and gout levels. A1c was improved.  We will refer you for circulation evaluation of legs.

## 2018-11-25 NOTE — Assessment & Plan Note (Signed)
Chronic, improved readings recently. Encouraged continued compliance with insulin and diabetic diet. Foot exam today.

## 2018-11-25 NOTE — Assessment & Plan Note (Signed)
Noticing worsening calf cramping with ambulation (she has recently increased walking due to decrease aquatic exercise opportunities). Update ABIs (last done ~2018).

## 2018-11-25 NOTE — Assessment & Plan Note (Signed)
Chronic, stable. Continue current regimen. 

## 2018-11-25 NOTE — Assessment & Plan Note (Signed)
Update renal panel today. Encouraged good hydration.

## 2018-11-25 NOTE — Progress Notes (Signed)
This visit was conducted in person.  BP 136/62 (BP Location: Left Arm, Patient Position: Sitting, Cuff Size: Normal)   Pulse 74   Temp 97.6 F (36.4 C) (Tympanic)   Ht 4' 11.5" (1.511 m)   Wt 123 lb 1 oz (55.8 kg)   SpO2 98%   BMI 24.44 kg/m    CC: 6 mo f/u visit Subjective:    Patient ID: Taylor Castaneda, female    DOB: 1933/04/14, 84 y.o.   MRN: [id number]  HPI: Taylor Castaneda is a 85 y.o. female presenting on 11/25/2018 for Follow-up (Here for 6 mo f/u. ) and Leg Pain (C/o leg cramps. )   Noticing increasing leg cramping when she tries to walk "charlie horse sensation". Has tried OTC leg cramp med with temporary benefit. Describes cramping of calves after walking a certain distance. She has decreased water aerobics due to Covid. She has increased walking. Improved when she sits to rest. Stretching legs also helps.   Gout - treated with PRN colchicine/probenecid. Some recent flares a few months ago.   CKD - last visit deterioration noted. She feels she stays well hydrated.   DM - followed by Dr Chalmers Cater. Latest A1c was 7.5% (last month). Does regularly check sugars - better controlled recently. Compliant with antihyperglycemic regimen which includes: lantus 30u daily, humalog SSI 5-12 u TID AC. Rare low sugars. Demoes paresthesias. Last diabetic eye exam 01/2018. Pneumovax: 2012. Prevnar: 2015. Glucometer brand: freestyle. DSME: about 10 yrs ago.  Lab Results  Component Value Date   HGBA1C 7.2 (A) 11/25/2018   Diabetic Foot Exam - Simple   Simple Foot Form Diabetic Foot exam was performed with the following findings: Yes 11/25/2018  9:20 AM  Visual Inspection No deformities, no ulcerations, no other skin breakdown bilaterally: Yes Sensation Testing Intact to touch and monofilament testing bilaterally: Yes Pulse Check See comments: Yes Comments Diminished pulses bilaterally    Lab Results  Component Value Date   MICROALBUR 2.2 (H) 08/20/2017         Relevant past  medical, surgical, family and social history reviewed and updated as indicated. Interim medical history since our last visit reviewed. Allergies and medications reviewed and updated. Outpatient Medications Prior to Visit  Medication Sig Dispense Refill  . atorvastatin (LIPITOR) 40 MG tablet TAKE 1/2 (ONE-HALF) TABLET BY MOUTH ONCE DAILY 45 tablet 1  . B-D ULTRAFINE III SHORT PEN 31G X 8 MM MISC USE AS DIRECTED WITH INSULIN 100 each 3  . Carboxymethylcellul-Glycerin (CLEAR EYES FOR DRY EYES) 1-0.25 % SOLN Place 1-2 drops into both eyes 3 (three) times daily as needed (for dry/irritated eyes.).    Marland Kitchen carvedilol (COREG) 25 MG tablet TAKE 1 TABLET BY MOUTH TWICE DAILY 180 tablet 2  . Cholecalciferol (VITAMIN D3 PO) Take 2,500 Units by mouth daily.    . furosemide (LASIX) 40 MG tablet TAKE 1/2 (ONE-HALF) TABLET BY MOUTH ONCE DAILY 45 tablet 1  . glucose blood (FREESTYLE LITE) test strip Use as instructed (Patient taking differently: 1 each by Other route 4 (four) times daily. Use as instructed) 100 each 2  . HUMALOG KWIKPEN 100 UNIT/ML KiwkPen Inject 10 Units into the skin 3 (three) times daily before meals.  4  . LANTUS SOLOSTAR 100 UNIT/ML Solostar Pen Inject 30 Units into the skin at bedtime.    . potassium chloride (K-DUR) 10 MEQ tablet TAKE 1 TABLET BY MOUTH ON MONDAY, WEDNESDAY AND FRIDAY 36 tablet 0  . ramipril (ALTACE) 2.5 MG capsule Take  1 capsule by mouth once daily 90 capsule 0  . colchicine-probenecid 0.5-500 MG tablet Take 1 tablet by mouth 2 (two) times daily as needed. 60 tablet 0   No facility-administered medications prior to visit.      Per HPI unless specifically indicated in ROS section below Review of Systems Objective:    BP 136/62 (BP Location: Left Arm, Patient Position: Sitting, Cuff Size: Normal)   Pulse 74   Temp 97.6 F (36.4 C) (Tympanic)   Ht 4' 11.5" (1.511 m)   Wt 123 lb 1 oz (55.8 kg)   SpO2 98%   BMI 24.44 kg/m   Wt Readings from Last 3 Encounters:   11/25/18 123 lb 1 oz (55.8 kg)  02/20/18 135 lb 4 oz (61.3 kg)  02/18/18 134 lb (60.8 kg)    Physical Exam Vitals signs and nursing note reviewed.  Constitutional:      General: She is not in acute distress.    Appearance: She is well-developed.  HENT:     Head: Normocephalic and atraumatic.     Right Ear: External ear normal.     Left Ear: External ear normal.     Nose: Nose normal.     Mouth/Throat:     Pharynx: No oropharyngeal exudate.  Eyes:     General: No scleral icterus.    Conjunctiva/sclera: Conjunctivae normal.     Pupils: Pupils are equal, round, and reactive to light.  Neck:     Musculoskeletal: Normal range of motion and neck supple.  Cardiovascular:     Rate and Rhythm: Normal rate and regular rhythm.     Heart sounds: Normal heart sounds. No murmur.  Pulmonary:     Effort: Pulmonary effort is normal. No respiratory distress.     Breath sounds: Normal breath sounds. No wheezing or rales.  Musculoskeletal:        General: Swelling present.     Right lower leg: No edema.     Left lower leg: No edema.     Comments:  See HPI for foot exam if done Diminished pulses BLE R medial ankle red, warm, tender  Lymphadenopathy:     Cervical: No cervical adenopathy.  Skin:    General: Skin is warm and dry.     Findings: Erythema present. No rash.       Results for orders placed or performed in visit on 11/25/18  POCT glycosylated hemoglobin (Hb A1C)  Result Value Ref Range   Hemoglobin A1C 7.2 (A) 4.0 - 5.6 %   HbA1c POC (<> result, manual entry)     HbA1c, POC (prediabetic range)     HbA1c, POC (controlled diabetic range)     Assessment & Plan:   Problem List Items Addressed This Visit    PAD (peripheral artery disease) ([place])    Noticing worsening calf cramping with ambulation (she has recently increased walking due to decrease aquatic exercise opportunities). Update ABIs (last done ~2018).       Relevant Orders   VAS [place] LE ART SEG MULTI (Segm&LE  Re[facility]s)   Essential hypertension    Chronic, stable. Continue current regimen.       Controlled type 2 diabetes mellitus with diabetic nephropathy, with long-term current use of insulin (HCC) - Primary    Chronic, improved readings recently. Encouraged continued compliance with insulin and diabetic diet. Foot exam today.       Relevant Orders   POCT glycosylated hemoglobin (Hb A1C) (Completed)   VAS [place] LE ART SEG  MULTI (Segm&LE [facility]auds)   CKD stage 3 due to type 2 diabetes mellitus ([place])    Update renal panel today. Encouraged good hydration.       Relevant Orders   Renal function panel   Acute gout    Currently in acute flare - treat with colchicine 0.6mg  PRN. Update if not improving with treatment. Update urate level today. Consider allopurinol once gout flare has resolved in setting of CKD.       Relevant Orders   Uric acid    Other Visit Diagnoses    Leg cramping       Relevant Orders   VAS [place] LE ART SEG MULTI (Segm&LE [facility]auds)       Meds ordered this encounter  Medications  . colchicine 0.6 MG tablet    Sig: Take 1 tablet (0.6 mg total) by mouth daily as needed (gout flare). First day of gout flare, may take 1 tablet twice daily.    Dispense:  30 tablet    Refill:  1   Orders Placed This Encounter  Procedures  . Renal function panel  . Uric acid  . POCT glycosylated hemoglobin (Hb A1C)    Follow up plan: No follow-ups on file.  [name], MD

## 2018-12-09 ENCOUNTER — Other Ambulatory Visit: Payer: Self-pay | Admitting: Family Medicine

## 2018-12-19 ENCOUNTER — Telehealth: Payer: Self-pay

## 2018-12-19 NOTE — Telephone Encounter (Signed)

## 2018-12-22 ENCOUNTER — Other Ambulatory Visit: Payer: Self-pay

## 2018-12-22 ENCOUNTER — Ambulatory Visit (INDEPENDENT_AMBULATORY_CARE_PROVIDER_SITE_OTHER): Payer: Medicare Other

## 2018-12-22 DIAGNOSIS — I739 Peripheral vascular disease, unspecified: Secondary | ICD-10-CM | POA: Diagnosis not present

## 2018-12-22 DIAGNOSIS — E1121 Type 2 diabetes mellitus with diabetic nephropathy: Secondary | ICD-10-CM

## 2018-12-22 DIAGNOSIS — Z794 Long term (current) use of insulin: Secondary | ICD-10-CM

## 2018-12-22 DIAGNOSIS — R252 Cramp and spasm: Secondary | ICD-10-CM | POA: Diagnosis not present

## 2019-01-08 DIAGNOSIS — H43393 Other vitreous opacities, bilateral: Secondary | ICD-10-CM | POA: Diagnosis not present

## 2019-01-08 DIAGNOSIS — H35033 Hypertensive retinopathy, bilateral: Secondary | ICD-10-CM | POA: Diagnosis not present

## 2019-01-08 DIAGNOSIS — H353132 Nonexudative age-related macular degeneration, bilateral, intermediate dry stage: Secondary | ICD-10-CM | POA: Diagnosis not present

## 2019-01-08 DIAGNOSIS — H402232 Chronic angle-closure glaucoma, bilateral, moderate stage: Secondary | ICD-10-CM | POA: Diagnosis not present

## 2019-01-28 ENCOUNTER — Ambulatory Visit (INDEPENDENT_AMBULATORY_CARE_PROVIDER_SITE_OTHER): Payer: Medicare Other | Admitting: Family Medicine

## 2019-01-28 ENCOUNTER — Other Ambulatory Visit: Payer: Self-pay

## 2019-01-28 ENCOUNTER — Encounter: Payer: Self-pay | Admitting: Family Medicine

## 2019-01-28 VITALS — BP 190/90 | HR 74 | Temp 97.8°F | Ht 59.5 in | Wt 120.4 lb

## 2019-01-28 DIAGNOSIS — N183 Chronic kidney disease, stage 3 unspecified: Secondary | ICD-10-CM

## 2019-01-28 DIAGNOSIS — E1122 Type 2 diabetes mellitus with diabetic chronic kidney disease: Secondary | ICD-10-CM

## 2019-01-28 DIAGNOSIS — I10 Essential (primary) hypertension: Secondary | ICD-10-CM

## 2019-01-28 DIAGNOSIS — R634 Abnormal weight loss: Secondary | ICD-10-CM | POA: Diagnosis not present

## 2019-01-28 DIAGNOSIS — I739 Peripheral vascular disease, unspecified: Secondary | ICD-10-CM

## 2019-01-28 DIAGNOSIS — Z85038 Personal history of other malignant neoplasm of large intestine: Secondary | ICD-10-CM | POA: Diagnosis not present

## 2019-01-28 LAB — COMPREHENSIVE METABOLIC PANEL
ALT: 23 U/L (ref 0–35)
AST: 23 U/L (ref 0–37)
Albumin: 4.1 g/dL (ref 3.5–5.2)
Alkaline Phosphatase: 136 U/L — ABNORMAL HIGH (ref 39–117)
BUN: 22 mg/dL (ref 6–23)
CO2: 24 mEq/L (ref 19–32)
Calcium: 9.6 mg/dL (ref 8.4–10.5)
Chloride: 105 mEq/L (ref 96–112)
Creatinine, Ser: 1.12 mg/dL (ref 0.40–1.20)
GFR: 46.28 mL/min — ABNORMAL LOW (ref >60.00)
Glucose, Bld: 155 mg/dL — ABNORMAL HIGH (ref 70–99)
Potassium: 4.1 mEq/L (ref 3.5–5.1)
Sodium: 139 mEq/L (ref 135–145)
Total Bilirubin: 0.6 mg/dL (ref 0.2–1.2)
Total Protein: 7.2 g/dL (ref 6.0–8.3)

## 2019-01-28 LAB — CBC WITH DIFFERENTIAL/PLATELET
Basophils Absolute: 0.1 10*3/uL (ref 0.0–0.1)
Basophils Relative: 0.8 % (ref 0.0–3.0)
Eosinophils Absolute: 0.1 10*3/uL (ref 0.0–0.7)
Eosinophils Relative: 1.9 % (ref 0.0–5.0)
HCT: 41.5 % (ref 36.0–46.0)
Hemoglobin: 13.6 g/dL (ref 12.0–15.0)
Lymphocytes Relative: 33.5 % (ref 12.0–46.0)
Lymphs Abs: 2.6 10*3/uL (ref 0.7–4.0)
MCHC: 32.9 g/dL (ref 30.0–36.0)
MCV: 94.6 fl (ref 78.0–100.0)
Monocytes Absolute: 0.7 10*3/uL (ref 0.1–1.0)
Monocytes Relative: 9.7 % (ref 3.0–12.0)
Neutro Abs: 4.2 10*3/uL (ref 1.4–7.7)
Neutrophils Relative %: 54.1 % (ref 43.0–77.0)
Platelets: 192 10*3/uL (ref 150.0–400.0)
RBC: 4.39 Mil/uL (ref 3.87–5.11)
RDW: 14.4 % (ref 11.5–15.5)
WBC: 7.7 10*3/uL (ref 4.0–10.5)

## 2019-01-28 LAB — TSH: TSH: 2.12 u[IU]/mL (ref 0.35–4.50)

## 2019-01-28 MED ORDER — RAMIPRIL 5 MG PO CAPS: 2.5000 mg | ORAL_CAPSULE | Freq: Every day | ORAL | 1 refills | Status: DC

## 2019-01-28 NOTE — Assessment & Plan Note (Signed)
Chronic, deteriorated. Update labs including Cr, CBC, TSH. Increase ramipril to 5mg  daily. Reviewed low sodium diet. DASH diet handout provided.

## 2019-01-28 NOTE — Assessment & Plan Note (Signed)
Noticing worsening claudication (described as calf cramping with exertion). Declines further vascular eval at this time.

## 2019-01-28 NOTE — Patient Instructions (Addendum)
BP is staying too high.  Increase ramipril to 5mg  daily.  Blood work today.  Your goal blood pressure is <140/90. Work on low salt/sodium diet - goal <1.5gm (1,500mg ) per day. Eat a diet high in fruits/vegetables and whole grains.  Look into mediterranean and DASH diet. Goal activity is 164min/wk of moderate intensity exercise.  This can be split into 30 minute chunks.  If you are not at this level, you can start with smaller 10-15 min increments and slowly build up activity. Look at [email or web address].org for more resources   DASH Eating Plan DASH stands for "Dietary Approaches to Stop Hypertension." The DASH eating plan is a healthy eating plan that has been shown to reduce high blood pressure (hypertension). It may also reduce your risk for type 2 diabetes, heart disease, and stroke. The DASH eating plan may also help with weight loss. What are tips for following this plan?  General guidelines  Avoid eating more than 2,300 mg (milligrams) of salt (sodium) a day. If you have hypertension, you may need to reduce your sodium intake to 1,500 mg a day.  Limit alcohol intake to no more than 1 drink a day for nonpregnant women and 2 drinks a day for men. One drink equals 12 oz of beer, 5 oz of wine, or 1 oz of hard liquor.  Work with your health care provider to maintain a healthy body weight or to lose weight. Ask what an ideal weight is for you.  Get at least 30 minutes of exercise that causes your heart to beat faster (aerobic exercise) most days of the week. Activities may include walking, swimming, or biking.  Work with your health care provider or diet and nutrition specialist (dietitian) to adjust your eating plan to your individual calorie needs. Reading food labels   Check food labels for the amount of sodium per serving. Choose foods with less than 5 percent of the Daily Value of sodium. Generally, foods with less than 300 mg of sodium per serving fit into this eating plan.  To find whole  grains, look for the word "whole" as the first word in the ingredient list. Shopping  Buy products labeled as "low-sodium" or "no salt added."  Buy fresh foods. Avoid canned foods and premade or frozen meals. Cooking  Avoid adding salt when cooking. Use salt-free seasonings or herbs instead of table salt or sea salt. Check with your health care provider or pharmacist before using salt substitutes.  Do not fry foods. Cook foods using healthy methods such as baking, boiling, grilling, and broiling instead.  Cook with heart-healthy oils, such as olive, canola, soybean, or sunflower oil. Meal planning  Eat a balanced diet that includes: ? 5 or more servings of fruits and vegetables each day. At each meal, try to fill half of your plate with fruits and vegetables. ? Up to 6-8 servings of whole grains each day. ? Less than 6 oz of lean meat, poultry, or fish each day. A 3-oz serving of meat is about the same size as a deck of cards. One egg equals 1 oz. ? 2 servings of low-fat dairy each day. ? A serving of nuts, seeds, or beans 5 times each week. ? Heart-healthy fats. Healthy fats called Omega-3 fatty acids are found in foods such as flaxseeds and coldwater fish, like sardines, salmon, and mackerel.  Limit how much you eat of the following: ? Canned or prepackaged foods. ? Food that is high in trans fat, such as fried foods. ?  Food that is high in saturated fat, such as fatty meat. ? Sweets, desserts, sugary drinks, and other foods with added sugar. ? Full-fat dairy products.  Do not salt foods before eating.  Try to eat at least 2 vegetarian meals each week.  Eat more home-cooked food and less restaurant, buffet, and fast food.  When eating at a restaurant, ask that your food be prepared with less salt or no salt, if possible. What foods are recommended? The items listed may not be a complete list. Talk with your dietitian about what dietary choices are best for  you. Grains Whole-grain or whole-wheat bread. Whole-grain or whole-wheat pasta. Brown rice. [place]. Bulgur. Whole-grain and low-sodium cereals. Pita bread. Low-fat, low-sodium crackers. Whole-wheat flour tortillas. Vegetables Fresh or frozen vegetables (raw, steamed, roasted, or grilled). Low-sodium or reduced-sodium tomato and vegetable juice. Low-sodium or reduced-sodium tomato sauce and tomato paste. Low-sodium or reduced-sodium canned vegetables. Fruits All fresh, dried, or frozen fruit. Canned fruit in natural juice (without added sugar). Meat and other protein foods Skinless chicken or [place]. Ground chicken or [place]. Pork with fat trimmed off. Fish and seafood. Egg whites. Dried beans, peas, or lentils. Unsalted nuts, nut butters, and seeds. Unsalted canned beans. Lean cuts of beef with fat trimmed off. Low-sodium, lean deli meat. Dairy Low-fat (1%) or fat-free (skim) milk. Fat-free, low-fat, or reduced-fat cheeses. Nonfat, low-sodium ricotta or cottage cheese. Low-fat or nonfat yogurt. Low-fat, low-sodium cheese. Fats and oils Soft margarine without trans fats. Vegetable oil. Low-fat, reduced-fat, or light mayonnaise and salad dressings (reduced-sodium). Canola, safflower, olive, soybean, and sunflower oils. Avocado. Seasoning and other foods Herbs. Spices. Seasoning mixes without salt. Unsalted popcorn and pretzels. Fat-free sweets. What foods are not recommended? The items listed may not be a complete list. Talk with your dietitian about what dietary choices are best for you. Grains Baked goods made with fat, such as croissants, muffins, or some breads. Dry pasta or rice meal packs. Vegetables Creamed or fried vegetables. Vegetables in a cheese sauce. Regular canned vegetables (not low-sodium or reduced-sodium). Regular canned tomato sauce and paste (not low-sodium or reduced-sodium). Regular tomato and vegetable juice (not low-sodium or reduced-sodium). Angie Fava.  Olives. Fruits Canned fruit in a light or heavy syrup. Fried fruit. Fruit in cream or butter sauce. Meat and other protein foods Fatty cuts of meat. Ribs. Fried meat. Berniece Salines. Sausage. Bologna and other processed lunch meats. Salami. Fatback. Hotdogs. Bratwurst. Salted nuts and seeds. Canned beans with added salt. Canned or smoked fish. Whole eggs or egg yolks. Chicken or [place] with skin. Dairy Whole or 2% milk, cream, and half-and-half. Whole or full-fat cream cheese. Whole-fat or sweetened yogurt. Full-fat cheese. Nondairy creamers. Whipped toppings. Processed cheese and cheese spreads. Fats and oils Butter. Stick margarine. Lard. Shortening. Ghee. Bacon fat. Tropical oils, such as coconut, palm kernel, or palm oil. Seasoning and other foods Salted popcorn and pretzels. Onion salt, garlic salt, seasoned salt, table salt, and sea salt. Worcestershire sauce. Tartar sauce. Barbecue sauce. Teriyaki sauce. Soy sauce, including reduced-sodium. Steak sauce. Canned and packaged gravies. Fish sauce. Oyster sauce. Cocktail sauce. Horseradish that you find on the shelf. Ketchup. Mustard. Meat flavorings and tenderizers. Bouillon cubes. Hot sauce and Tabasco sauce. Premade or packaged marinades. Premade or packaged taco seasonings. Relishes. Regular salad dressings. Where to find more information:  National Heart, Lung, and [name]: https://wilson-e[place].com/  American Heart Association: www.heart.org Summary  The DASH eating plan is a healthy eating plan that has been shown to reduce high blood  pressure (hypertension). It may also reduce your risk for type 2 diabetes, heart disease, and stroke.  With the DASH eating plan, you should limit salt (sodium) intake to 2,300 mg a day. If you have hypertension, you may need to reduce your sodium intake to 1,500 mg a day.  When on the DASH eating plan, aim to eat more fresh fruits and vegetables, whole grains, lean proteins, low-fat dairy, and heart-healthy  fats.  Work with your health care provider or diet and nutrition specialist (dietitian) to adjust your eating plan to your individual calorie needs. This information is not intended to replace advice given to you by your health care provider. Make sure you discuss any questions you have with your health care provider. Document Released: 05/10/2011 Document Revised: 05/03/2017 Document Reviewed: 05/14/2016 Elsevier Patient Education  2020 [employer].

## 2019-01-28 NOTE — Progress Notes (Signed)
This visit was conducted in person.  BP (!) 190/90   Pulse 74   Temp 97.8 F (36.6 C) (Skin)   Ht 4' 11.5" (1.511 m)   Wt 120 lb 7 oz (54.6 kg)   SpO2 98%   BMI 23.92 kg/m   Markedly high on repeat BP Readings from Last 3 Encounters:  01/28/19 (!) 190/90  11/25/18 136/62  02/20/18 130/62    CC: check BP Subjective:    Patient ID: Taylor Castaneda, female    DOB: 1933/04/21, 85 y.o.   MRN: [id number]  HPI: ZAKIRAH WEINGART is a 85 y.o. female presenting on 01/28/2019 for discuss blood pressure (getting high readings at home)   HTN - Compliant with current antihypertensive regimen of carvedilol 25mg  bid, lasix 20mg  daily, and ramipril 2.5mg  daily. Does check blood pressures at home: 671-245 systolic, checks 3-4 times a day. No low blood pressure readings or symptoms of dizziness/syncope.  Denies HA, vision changes, CP/tightness, SOB, leg swelling.  Noticing some leg cramping with ambulation, improves with rest.  Denies diet changes. Trying to drink good water. No new medicines or supplements.   No appetite. 15 lb weight loss in the last year. Denies dysphagia, abd pain, blood in stool or urine. Denies GERD or acid reflux. Mild early satiety - states has lost taste in food. Denies breast mass. Overdue for mammogram, declines.      Relevant past medical, surgical, family and social history reviewed and updated as indicated. Interim medical history since our last visit reviewed. Allergies and medications reviewed and updated. Outpatient Medications Prior to Visit  Medication Sig Dispense Refill  . atorvastatin (LIPITOR) 40 MG tablet TAKE 1/2 (ONE-HALF) TABLET BY MOUTH ONCE DAILY 45 tablet 1  . B-D ULTRAFINE III SHORT PEN 31G X 8 MM MISC USE AS DIRECTED WITH INSULIN 100 each 3  . Carboxymethylcellul-Glycerin (CLEAR EYES FOR DRY EYES) 1-0.25 % SOLN Place 1-2 drops into both eyes 3 (three) times daily as needed (for dry/irritated eyes.).    Marland Kitchen carvedilol (COREG) 25 MG tablet TAKE 1 TABLET  BY MOUTH TWICE DAILY 180 tablet 2  . Cholecalciferol (VITAMIN D3 PO) Take 2,500 Units by mouth daily.    . colchicine 0.6 MG tablet Take 1 tablet (0.6 mg total) by mouth daily as needed (gout flare). First day of gout flare, may take 1 tablet twice daily. 30 tablet 1  . furosemide (LASIX) 40 MG tablet TAKE 1/2 (ONE-HALF) TABLET BY MOUTH ONCE DAILY 45 tablet 1  . glucose blood (FREESTYLE LITE) test strip Use as instructed (Patient taking differently: 1 each by Other route 4 (four) times daily. Use as instructed) 100 each 2  . HUMALOG KWIKPEN 100 UNIT/ML KiwkPen Inject 10 Units into the skin 3 (three) times daily before meals.  4  . LANTUS SOLOSTAR 100 UNIT/ML Solostar Pen Inject 30 Units into the skin at bedtime.    . potassium chloride (K-DUR) 10 MEQ tablet TAKE 1 TABLET BY MOUTH ON MONDAY, WEDNESDAY AND FRIDAY 36 tablet 0  . ramipril (ALTACE) 2.5 MG capsule Take 1 capsule by mouth once daily 90 capsule 1   No facility-administered medications prior to visit.      Per HPI unless specifically indicated in ROS section below Review of Systems Objective:    BP (!) 190/90   Pulse 74   Temp 97.8 F (36.6 C) (Skin)   Ht 4' 11.5" (1.511 m)   Wt 120 lb 7 oz (54.6 kg)   SpO2 98%  BMI 23.92 kg/m   Wt Readings from Last 3 Encounters:  01/28/19 120 lb 7 oz (54.6 kg)  11/25/18 123 lb 1 oz (55.8 kg)  02/20/18 135 lb 4 oz (61.3 kg)    Physical Exam Vitals signs and nursing note reviewed.  Constitutional:      General: She is not in acute distress.    Appearance: Normal appearance. She is not ill-appearing.  HENT:     Head: Normocephalic and atraumatic.     Mouth/Throat:     Mouth: Mucous membranes are moist.     Pharynx: No posterior oropharyngeal erythema.  Eyes:     General: No scleral icterus.    Extraocular Movements: Extraocular movements intact.     Conjunctiva/sclera: Conjunctivae normal.     Pupils: Pupils are equal, round, and reactive to light.  Cardiovascular:     Rate and  Rhythm: Normal rate and regular rhythm.     Pulses: Normal pulses.     Heart sounds: Murmur (2/6 systolic RUSB) present.  Pulmonary:     Effort: Pulmonary effort is normal. No respiratory distress.     Breath sounds: Normal breath sounds. No wheezing, rhonchi or rales.  Abdominal:     General: Abdomen is flat. Bowel sounds are normal. There is no distension.     Palpations: Abdomen is soft. There is no mass.     Tenderness: There is abdominal tenderness (mild) in the epigastric area. There is no right CVA tenderness, left CVA tenderness, guarding or rebound.     Hernia: No hernia is present.  Musculoskeletal:     Right lower leg: No edema.     Left lower leg: No edema.  Skin:    Findings: No rash.  Neurological:     Mental Status: She is alert.  Psychiatric:        Mood and Affect: Mood normal.       Results for orders placed or performed in visit on 11/25/18  Renal function panel  Result Value Ref Range   Sodium 137 135 - 145 mEq/L   Potassium 4.8 3.5 - 5.1 mEq/L   Chloride 102 96 - 112 mEq/L   CO2 25 19 - 32 mEq/L   Calcium 9.6 8.4 - 10.5 mg/dL   Albumin 3.9 3.5 - 5.2 g/dL   BUN 36 (H) 6 - 23 mg/dL   Creatinine, Ser 1.37 (H) 0.40 - 1.20 mg/dL   Glucose, Bld 168 (H) 70 - 99 mg/dL   Phosphorus 3.5 2.3 - 4.6 mg/dL   GFR 36.69 (L) >60.00 mL/min  Uric acid  Result Value Ref Range   Uric Acid, Serum 7.7 (H) 2.4 - 7.0 mg/dL  POCT glycosylated hemoglobin (Hb A1C)  Result Value Ref Range   Hemoglobin A1C 7.2 (A) 4.0 - 5.6 %   HbA1c POC (<> result, manual entry)     HbA1c, POC (prediabetic range)     HbA1c, POC (controlled diabetic range)     Assessment & Plan:   Problem List Items Addressed This Visit    Weight loss    Noted today. Check labs, consider abd imaging to further eval pending lab results. Reassess at 3 wk f/u visit.       PAD (peripheral artery disease) ([place])    Noticing worsening claudication (described as calf cramping with exertion). Declines further  vascular eval at this time.       Relevant Medications   ramipril (ALTACE) 5 MG capsule   History of colon cancer   Essential hypertension -  Primary    Chronic, deteriorated. Update labs including Cr, CBC, TSH. Increase ramipril to 5mg  daily. Reviewed low sodium diet. DASH diet handout provided.       Relevant Medications   ramipril (ALTACE) 5 MG capsule   Other Relevant Orders   Comprehensive metabolic panel   TSH   CKD stage 3 due to type 2 diabetes mellitus (H[facility])   Relevant Medications   ramipril (ALTACE) 5 MG capsule   Other Relevant Orders   CBC with Differential/Platelet   Comprehensive metabolic panel       Meds ordered this encounter  Medications  . ramipril (ALTACE) 5 MG capsule    Sig: Take 1 capsule (5 mg total) by mouth daily.    Dispense:  90 capsule    Refill:  1    Note new sig   Orders Placed This Encounter  Procedures  . CBC with Differential/Platelet  . Comprehensive metabolic panel  . TSH    Follow up plan: Return in about 3 weeks (around 02/18/2019) for follow up visit.  [name], MD

## 2019-01-28 NOTE — Assessment & Plan Note (Addendum)
Noted today. Check labs, consider abd imaging to further eval pending lab results. Reassess at 3 wk f/u visit.

## 2019-01-30 ENCOUNTER — Other Ambulatory Visit: Payer: Self-pay | Admitting: Family Medicine

## 2019-02-10 IMAGING — DX DG CHEST 2V
2 series · 2 of 2 positions shown · non-contrast
Comparison: 06/09/2012

CLINICAL DATA: Preoperative evaluation

EXAM:
CHEST  2 VIEW

[chest pa]
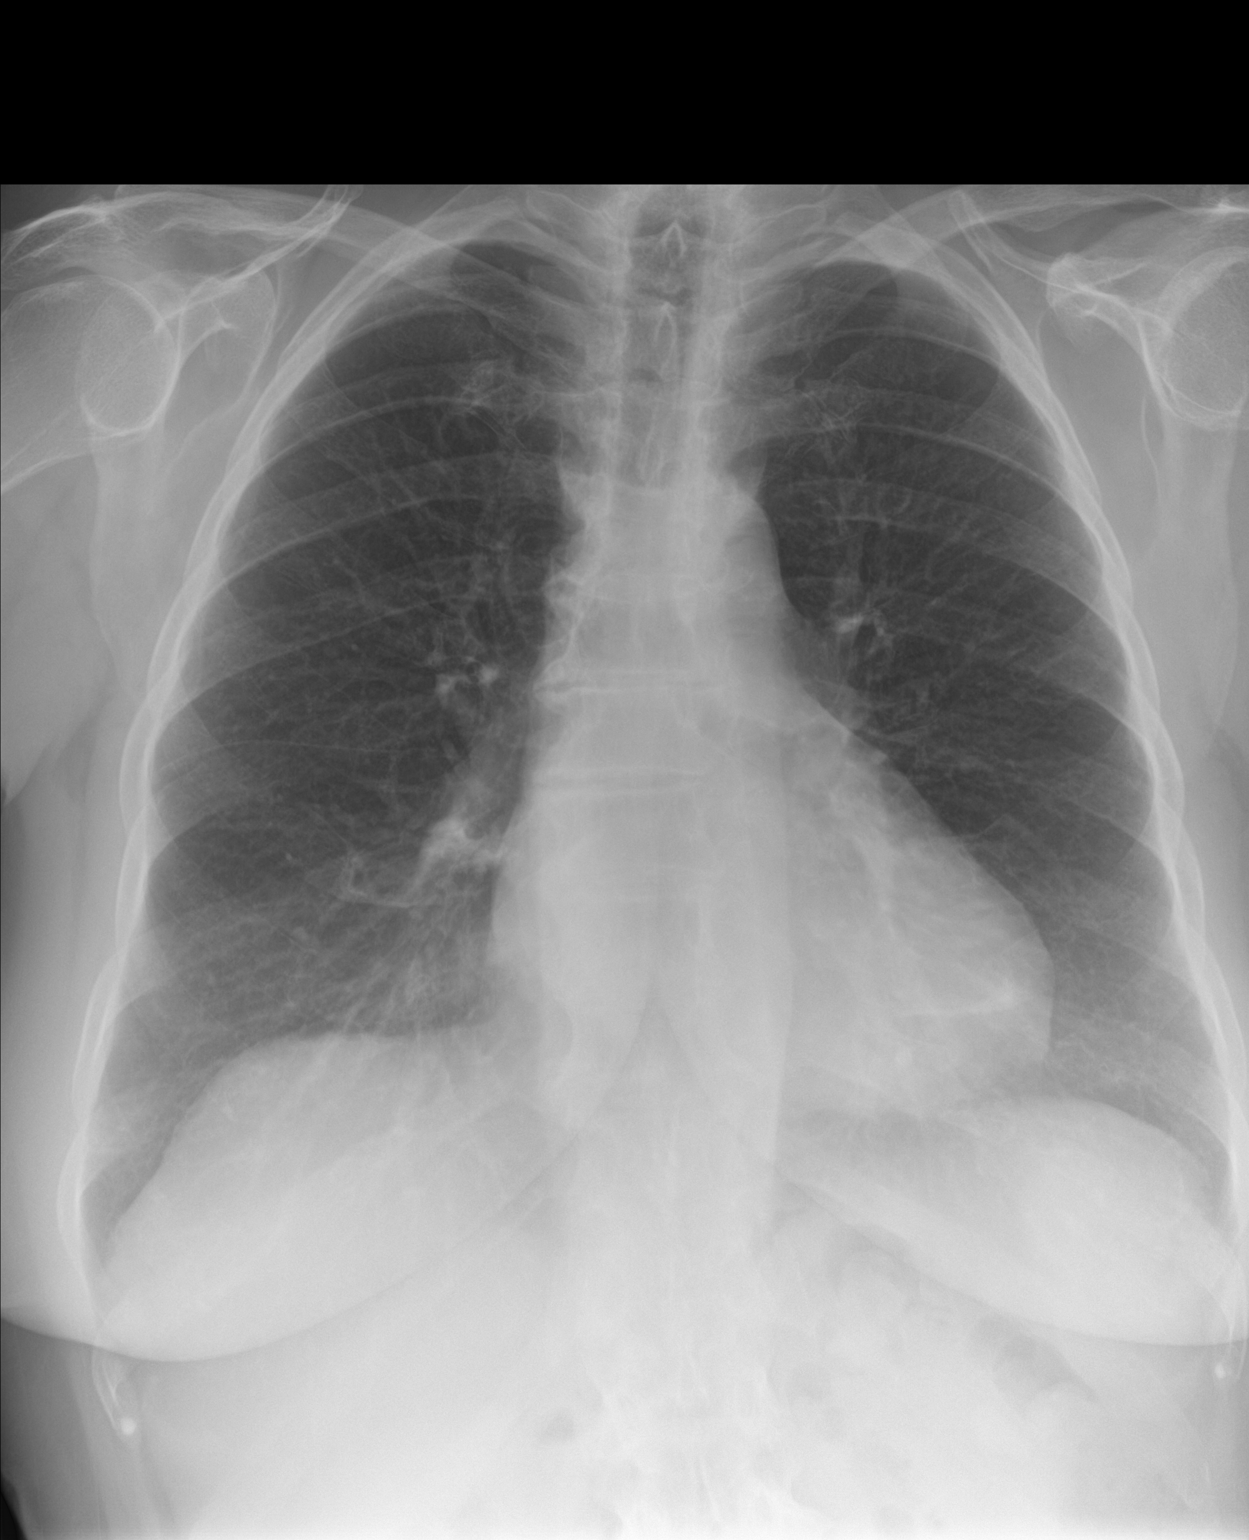

[chest lat]
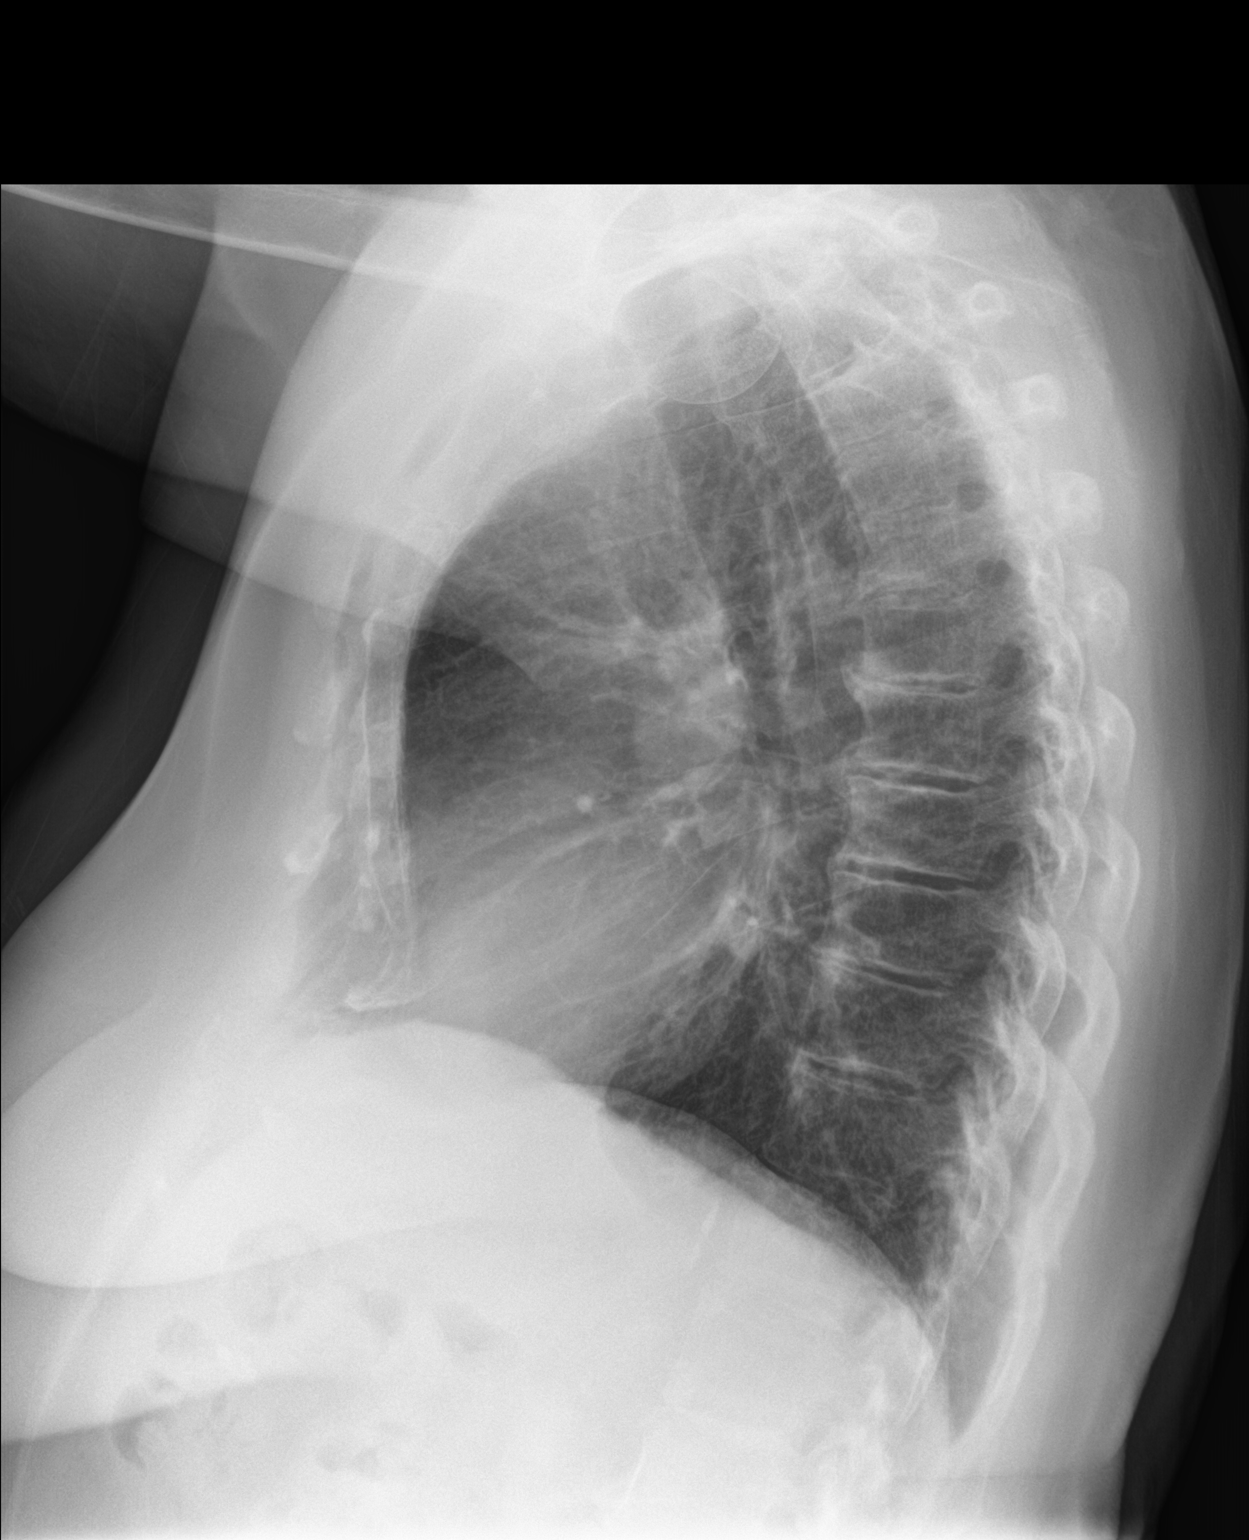

[2 of 2 positions shown; findings below may reference images not displayed]

FINDINGS: Cardiac shadow is at the upper limits of normal in size. The lungs
are well aerated bilaterally. No focal infiltrate or sizable
effusion is seen. Degenerative changes of the thoracic spine are
noted.
IMPRESSION: No acute abnormality noted.

## 2019-02-25 ENCOUNTER — Other Ambulatory Visit: Payer: Self-pay | Admitting: Family Medicine

## 2019-02-25 DIAGNOSIS — E1122 Type 2 diabetes mellitus with diabetic chronic kidney disease: Secondary | ICD-10-CM

## 2019-02-25 DIAGNOSIS — N183 Chronic kidney disease, stage 3 unspecified: Secondary | ICD-10-CM

## 2019-02-25 DIAGNOSIS — E1121 Type 2 diabetes mellitus with diabetic nephropathy: Secondary | ICD-10-CM

## 2019-02-25 DIAGNOSIS — I10 Essential (primary) hypertension: Secondary | ICD-10-CM

## 2019-02-25 DIAGNOSIS — E785 Hyperlipidemia, unspecified: Secondary | ICD-10-CM

## 2019-02-25 DIAGNOSIS — M109 Gout, unspecified: Secondary | ICD-10-CM

## 2019-02-26 ENCOUNTER — Ambulatory Visit: Payer: Medicare Other

## 2019-02-26 ENCOUNTER — Other Ambulatory Visit (INDEPENDENT_AMBULATORY_CARE_PROVIDER_SITE_OTHER): Payer: Medicare Other

## 2019-02-26 ENCOUNTER — Ambulatory Visit (INDEPENDENT_AMBULATORY_CARE_PROVIDER_SITE_OTHER): Payer: Medicare Other

## 2019-02-26 DIAGNOSIS — M109 Gout, unspecified: Secondary | ICD-10-CM

## 2019-02-26 DIAGNOSIS — Z0000 Encounter for general adult medical examination without abnormal findings: Secondary | ICD-10-CM

## 2019-02-26 DIAGNOSIS — N183 Chronic kidney disease, stage 3 (moderate): Secondary | ICD-10-CM | POA: Diagnosis not present

## 2019-02-26 DIAGNOSIS — E785 Hyperlipidemia, unspecified: Secondary | ICD-10-CM

## 2019-02-26 DIAGNOSIS — E1122 Type 2 diabetes mellitus with diabetic chronic kidney disease: Secondary | ICD-10-CM

## 2019-02-26 DIAGNOSIS — E1121 Type 2 diabetes mellitus with diabetic nephropathy: Secondary | ICD-10-CM

## 2019-02-26 DIAGNOSIS — Z794 Long term (current) use of insulin: Secondary | ICD-10-CM

## 2019-02-26 LAB — LIPID PANEL
Cholesterol: 275 mg/dL — ABNORMAL HIGH (ref 0–200)
HDL: 39.8 mg/dL (ref >39.00)
LDL Cholesterol: 206 mg/dL — ABNORMAL HIGH (ref 0–99)
NonHDL: 235.5
Total CHOL/HDL Ratio: 7
Triglycerides: 149 mg/dL (ref 0.0–149.0)
VLDL: 29.8 mg/dL (ref 0.0–40.0)

## 2019-02-26 LAB — COMPREHENSIVE METABOLIC PANEL
ALT: 17 U/L (ref 0–35)
AST: 18 U/L (ref 0–37)
Albumin: 4 g/dL (ref 3.5–5.2)
Alkaline Phosphatase: 115 U/L (ref 39–117)
BUN: 25 mg/dL — ABNORMAL HIGH (ref 6–23)
CO2: 25 mEq/L (ref 19–32)
Calcium: 9.6 mg/dL (ref 8.4–10.5)
Chloride: 103 mEq/L (ref 96–112)
Creatinine, Ser: 1.2 mg/dL (ref 0.40–1.20)
GFR: 42.73 mL/min — ABNORMAL LOW (ref >60.00)
Glucose, Bld: 82 mg/dL (ref 70–99)
Potassium: 3.9 mEq/L (ref 3.5–5.1)
Sodium: 139 mEq/L (ref 135–145)
Total Bilirubin: 0.7 mg/dL (ref 0.2–1.2)
Total Protein: 7 g/dL (ref 6.0–8.3)

## 2019-02-26 LAB — HEMOGLOBIN A1C: Hgb A1c MFr Bld: 7.4 % — ABNORMAL HIGH (ref 4.6–6.5)

## 2019-02-26 LAB — URIC ACID: Uric Acid, Serum: 7.1 mg/dL — ABNORMAL HIGH (ref 2.4–7.0)

## 2019-02-26 LAB — VITAMIN D 25 HYDROXY (VIT D DEFICIENCY, FRACTURES): VITD: 45.81 ng/mL (ref 30.00–100.00)

## 2019-02-26 NOTE — Patient Instructions (Signed)
Taylor Castaneda , Thank you for taking time to come for your Medicare Wellness Visit. I appreciate your ongoing commitment to your health goals. Please review the following plan we discussed and let me know if I can assist you in the future.   Screening recommendations/referrals: Colonoscopy: no longer required Mammogram: no longer required Bone Density: up to date, completed 03/31/2014 Recommended yearly ophthalmology/optometry visit for glaucoma screening and checkup Recommended yearly dental visit for hygiene and checkup  Vaccinations: Influenza vaccine: will get at office visit next week Pneumococcal vaccine: series completed Tdap vaccine: declined Shingles vaccine: received first dose about 2 years ago per patient     Advanced directives: copy in chart  Conditions/risks identified: diabetes, hypertension, hyperlipidemia  Next appointment: 03/03/2019 @ 8:30 am    Preventive Care 73 Years and Older, Female Preventive care refers to lifestyle choices and visits with your health care provider that can promote health and wellness. What does preventive care include?  A yearly physical exam. This is also called an annual well check.  Dental exams once or twice a year.  Routine eye exams. Ask your health care provider how often you should have your eyes checked.  Personal lifestyle choices, including:  Daily care of your teeth and gums.  Regular physical activity.  Eating a healthy diet.  Avoiding tobacco and drug use.  Limiting alcohol use.  Practicing safe sex.  Taking low-dose aspirin every day.  Taking vitamin and mineral supplements as recommended by your health care provider. What happens during an annual well check? The services and screenings done by your health care provider during your annual well check will depend on your age, overall health, lifestyle risk factors, and family history of disease. Counseling  Your health care provider may ask you questions about  your:  Alcohol use.  Tobacco use.  Drug use.  Emotional well-being.  Home and relationship well-being.  Sexual activity.  Eating habits.  History of falls.  Memory and ability to understand (cognition).  Work and work Statistician.  Reproductive health. Screening  You may have the following tests or measurements:  Height, weight, and BMI.  Blood pressure.  Lipid and cholesterol levels. These may be checked every 5 years, or more frequently if you are over 37 years old.  Skin check.  Lung cancer screening. You may have this screening every year starting at age 2 if you have a 30-pack-year history of smoking and currently smoke or have quit within the past 15 years.  Fecal occult blood test (FOBT) of the stool. You may have this test every year starting at age 8.  Flexible sigmoidoscopy or colonoscopy. You may have a sigmoidoscopy every 5 years or a colonoscopy every 10 years starting at age 11.  Hepatitis C blood test.  Hepatitis B blood test.  Sexually transmitted disease (STD) testing.  Diabetes screening. This is done by checking your blood sugar (glucose) after you have not eaten for a while (fasting). You may have this done every 1-3 years.  Bone density scan. This is done to screen for osteoporosis. You may have this done starting at age 13.  Mammogram. This may be done every 1-2 years. Talk to your health care provider about how often you should have regular mammograms. Talk with your health care provider about your test results, treatment options, and if necessary, the need for more tests. Vaccines  Your health care provider may recommend certain vaccines, such as:  Influenza vaccine. This is recommended every year.  Tetanus, diphtheria,  and acellular pertussis (Tdap, Td) vaccine. You may need a Td booster every 10 years.  Zoster vaccine. You may need this after age 50.  Pneumococcal 13-valent conjugate (PCV13) vaccine. One dose is recommended  after age 42.  Pneumococcal polysaccharide (PPSV23) vaccine. One dose is recommended after age 22. Talk to your health care provider about which screenings and vaccines you need and how often you need them. This information is not intended to replace advice given to you by your health care provider. Make sure you discuss any questions you have with your health care provider. Document Released: 06/17/2015 Document Revised: 02/08/2016 Document Reviewed: 03/22/2015 Elsevier Interactive Patient Education  2017 [place] Prevention in the Home Falls can cause injuries. They can happen to people of all ages. There are many things you can do to make your home safe and to help prevent falls. What can I do on the outside of my home?  Regularly fix the edges of walkways and driveways and fix any cracks.  Remove anything that might make you trip as you walk through a door, such as a raised step or threshold.  Trim any bushes or trees on the path to your home.  Use bright outdoor lighting.  Clear any walking paths of anything that might make someone trip, such as rocks or tools.  Regularly check to see if handrails are loose or broken. Make sure that both sides of any steps have handrails.  Any raised decks and porches should have guardrails on the edges.  Have any leaves, snow, or ice cleared regularly.  Use sand or salt on walking paths during winter.  Clean up any spills in your garage right away. This includes oil or grease spills. What can I do in the bathroom?  Use night lights.  Install grab bars by the toilet and in the tub and shower. Do not use towel bars as grab bars.  Use non-skid mats or decals in the tub or shower.  If you need to sit down in the shower, use a plastic, non-slip stool.  Keep the floor dry. Clean up any water that spills on the floor as soon as it happens.  Remove soap buildup in the tub or shower regularly.  Attach bath mats securely with  double-sided non-slip rug tape.  Do not have throw rugs and other things on the floor that can make you trip. What can I do in the bedroom?  Use night lights.  Make sure that you have a light by your bed that is easy to reach.  Do not use any sheets or blankets that are too big for your bed. They should not hang down onto the floor.  Have a firm chair that has side arms. You can use this for support while you get dressed.  Do not have throw rugs and other things on the floor that can make you trip. What can I do in the kitchen?  Clean up any spills right away.  Avoid walking on wet floors.  Keep items that you use a lot in easy-to-reach places.  If you need to reach something above you, use a strong step stool that has a grab bar.  Keep electrical cords out of the way.  Do not use floor polish or wax that makes floors slippery. If you must use wax, use non-skid floor wax.  Do not have throw rugs and other things on the floor that can make you trip. What can I do with my  stairs?  Do not leave any items on the stairs.  Make sure that there are handrails on both sides of the stairs and use them. Fix handrails that are broken or loose. Make sure that handrails are as long as the stairways.  Check any carpeting to make sure that it is firmly attached to the stairs. Fix any carpet that is loose or worn.  Avoid having throw rugs at the top or bottom of the stairs. If you do have throw rugs, attach them to the floor with carpet tape.  Make sure that you have a light switch at the top of the stairs and the bottom of the stairs. If you do not have them, ask someone to add them for you. What else can I do to help prevent falls?  Wear shoes that:  Do not have high heels.  Have rubber bottoms.  Are comfortable and fit you well.  Are closed at the toe. Do not wear sandals.  If you use a stepladder:  Make sure that it is fully opened. Do not climb a closed stepladder.  Make  sure that both sides of the stepladder are locked into place.  Ask someone to hold it for you, if possible.  Clearly mark and make sure that you can see:  Any grab bars or handrails.  First and last steps.  Where the edge of each step is.  Use tools that help you move around (mobility aids) if they are needed. These include:  Canes.  Walkers.  Scooters.  Crutches.  Turn on the lights when you go into a dark area. Replace any light bulbs as soon as they burn out.  Set up your furniture so you have a clear path. Avoid moving your furniture around.  If any of your floors are uneven, fix them.  If there are any pets around you, be aware of where they are.  Review your medicines with your doctor. Some medicines can make you feel dizzy. This can increase your chance of falling. Ask your doctor what other things that you can do to help prevent falls. This information is not intended to replace advice given to you by your health care provider. Make sure you discuss any questions you have with your health care provider. Document Released: 03/17/2009 Document Revised: 10/27/2015 Document Reviewed: 06/25/2014 Elsevier Interactive Patient Education  2017 [employer].

## 2019-02-26 NOTE — Progress Notes (Signed)
Subjective:   Taylor Castaneda is a 85 y.o. female who presents for Medicare Annual (Subsequent) preventive examination.  Review of Systems:    This visit is being conducted through telemedicine due to the COVID-19 pandemic. This patient has given me verbal consent via doximity to conduct this visit, patient states they are participating from their home address. Some vital signs may be absent or patient reported.    Patient identification: identified by name, DOB, and current address  Cardiac Risk Factors include: advanced age (>67men, >16 women);diabetes mellitus;dyslipidemia;hypertension     Objective:     Vitals: There were no vitals taken for this visit.  There is no height or weight on file to calculate BMI.  Advanced Directives 02/26/2019 02/18/2018 02/07/2017 01/06/2017 12/17/2016 12/11/2016 08/09/2016  Does Patient Have a Medical Advance Directive? Yes Yes Yes Yes Yes Yes Yes  Type of Paramedic of [place];Living will [place];Living will [place];Living will Healthcare Power of [place]  Does patient want to make changes to medical advance directive? - - - - No - Patient declined - -  Copy of [place] in Chart? Yes - validated most recent copy scanned in chart (See row information) No - copy requested Yes - Yes Yes No - copy requested    Tobacco Social History   Tobacco Use  Smoking Status Former Smoker   Years: 2.00   Types: Cigarettes   Quit date: 06/04/1973   Years since quitting: 45.7  Smokeless Tobacco Never Used  Tobacco Comment   Smoked for 5 years 1965-1970 up to 1/2 pp week     Counseling given: Not Answered Comment: Smoked for 5 years 1965-1970 up to 1/2 pp week   Clinical Intake:  Pre-visit preparation completed: Yes  Pain : No/denies pain     Nutritional Risks:  None Diabetes: Yes CBG done?: No Did pt. bring in CBG monitor from home?: No  How often do you need to have someone help you when you read instructions, pamphlets, or other written materials from your doctor or pharmacy?: 1 - Never What is the last grade level you completed in school?: 12th  Interpreter Needed?: No  Information entered by :: CJohnson, LPN  Past Medical History:  Diagnosis Date   Anemia    Diabetes mellitus, type II ([place])    Glaucoma    Dr.Hecker   History of colon cancer 1998   Dr. Lennie Hummer   Hyperlipemia    Osteopenia 02/2012, 03/2014   DEXA hip -2.2   Renal insufficiency    Stenosing tenosynovitis of finger of left hand 2016   index - s/p steroid injection x2   Past Surgical History:  Procedure Laterality Date   ABI  11/2013   WNL   APPENDECTOMY     CARPAL TUNNEL RELEASE     Bilateral    CATARACT EXTRACTION Bilateral 2006   Implants   COLECTOMY  1998   For cancer   COLONOSCOPY  2008   Dr.Stark, Due 2011   COLONOSCOPY  06/2013   1 hyperplastic polyp, ileocecal anastomosis [facility])   dexa  02/2012, 03/2014   T score -2.2 at hip overall stable   [place] Left 12/17/2016   Procedure: LEFT TOTAL KNEE ARTHROPLASTY;  Surgeon: Gaynelle Arabian, MD;  Location: [facility];  Service:  Orthopedics;  Laterality: Left;   Family History  Problem Relation Age of Onset   Heart attack Father 76   Stroke Father 13   Diabetes Father    Hypertension Father    Heart disease Father        before age 57   Breast cancer Mother    Cancer Mother    Diabetes Sister    Cancer Sister    Peripheral vascular disease Sister    Breast cancer Sister    Hypertension Son    COPD Neg Hx    Asthma Neg Hx    Colon cancer Neg Hx    Esophageal cancer Neg Hx    Rectal cancer Neg Hx    Stomach cancer Neg Hx    Social History   Socioeconomic History   Marital status:  Widowed    Spouse name: Not on file   Number of children: 4   Years of education: Not on file   Highest education level: Not on file  Occupational History   Occupation: retired    Fish farm manager: Retired    Comment: Training and development officer strain: Not hard at [employer] insecurity    Worry: Never true    Inability: Never true   Transportation needs    Medical: No    Non-medical: No  Tobacco Use   Smoking status: Former Smoker    Years: 2.00    Types: Cigarettes    Quit date: 06/04/1973    Years since quitting: 45.7   Smokeless tobacco: Never Used   Tobacco comment: Smoked for 5 years 1965-1970 up to 1/2 pp week  Substance and Sexual Activity   Alcohol use: Not Currently   Drug use: No   Sexual activity: Never  Lifestyle   Physical activity    Days per week: 3 days    Minutes per session: 60 min   Stress: Not at all  Relationships   Social connections    Talks on phone: Not on file    Gets together: Not on file    Attends religious service: Not on file    Active member of club or organization: Not on file    Attends meetings of clubs or organizations: Not on file    Relationship status: Not on file  Other Topics Concern   Not on file  Social History Narrative   Lives alone, no pets   Neighbors keep an eye on each other - 12 unit condo.    Family nearby.   Occ: retired Regulatory affairs officer   Activity: walking   Diet: good water, fruits/vegetables daily    Outpatient Encounter Medications as of 02/26/2019  Medication Sig   atorvastatin (LIPITOR) 40 MG tablet TAKE 1/2 (ONE-HALF) TABLET BY MOUTH ONCE DAILY   B-D ULTRAFINE III SHORT PEN 31G X 8 MM MISC USE AS DIRECTED WITH INSULIN   Carboxymethylcellul-Glycerin (CLEAR EYES FOR DRY EYES) 1-0.25 % SOLN Place 1-2 drops into both eyes 3 (three) times daily as needed (for dry/irritated eyes.).   carvedilol (COREG) 25 MG tablet TAKE 1 TABLET BY MOUTH TWICE DAILY   Cholecalciferol (VITAMIN D3 PO)  Take 2,500 Units by mouth daily.   colchicine 0.6 MG tablet Take 1 tablet (0.6 mg total) by mouth daily as needed (gout flare). First day of gout flare, may take 1 tablet twice daily.   furosemide (LASIX) 40 MG tablet Take 1/2 (one-half) tablet by mouth once daily   glucose blood (FREESTYLE LITE) test strip  Use as instructed (Patient taking differently: 1 each by Other route 4 (four) times daily. Use as instructed)   HUMALOG KWIKPEN 100 UNIT/ML KiwkPen Inject 10 Units into the skin 3 (three) times daily before meals.   LANTUS SOLOSTAR 100 UNIT/ML Solostar Pen Inject 30 Units into the skin at bedtime.   potassium chloride (K-DUR) 10 MEQ tablet TAKE 1  BY MOUTH ON MONDAY, WEDNESDAY AND FRIDAY   ramipril (ALTACE) 5 MG capsule Take 1 capsule (5 mg total) by mouth daily.   No facility-administered encounter medications on file as of 02/26/2019.     Activities of Daily Living In your present state of health, do you have any difficulty performing the following activities: 02/26/2019  Hearing? N  Vision? N  Difficulty concentrating or making decisions? N  Walking or climbing stairs? N  Dressing or bathing? N  Doing errands, shopping? N  Preparing Food and eating ? N  Using the Toilet? N  In the past six months, have you accidently leaked urine? N  Do you have problems with loss of bowel control? N  Managing your Medications? N  Managing your Finances? N  Housekeeping or managing your Housekeeping? N  Some recent data might be hidden    Patient Care Team: [name], MD as PCP - General (Family Medicine) Jacelyn Pi, MD as Consulting Physician (Endocrinology)    Assessment:   This is a routine wellness examination for [place].  Exercise Activities and Dietary recommendations Current Exercise Habits: Structured exercise class, Type of exercise: Other - see comments(water aerobics), Time (Minutes): 60, Frequency (Times/Week): 3, Weekly Exercise (Minutes/Week): 180, Intensity:  Moderate, Exercise limited by: None identified  Goals     Increase physical activity     Starting 02/18/2018, I will continue to do water aerobics for 60 minutes 3-4 days per week and to walk at least 1/2 mile daily, weather permitting.      Patient Stated     02/26/2019, Patient wants to maintain and continue medications as prescribed.       Fall Risk Fall Risk  02/26/2019 02/18/2018 02/07/2017 01/30/2016 03/10/2014  Falls in the past year? 0 No Yes No No  Comment - - pt reports she blacked out and fell causing bruising to left foot - -  Number falls in past yr: 0 - 1 - -  Injury with Fall? - - Yes - -  Risk for fall due to : Medication side effect - - - -  Follow up Falls evaluation completed;Falls prevention discussed - - - -   Is the patient's home free of loose throw rugs in walkways, pet beds, electrical cords, etc?   yes      Grab bars in the bathroom? yes      Handrails on the stairs?   yes      Adequate lighting?   yes  Timed Get Up and Go performed: n/a  Depression Screen PHQ 2/9 Scores 02/26/2019 02/18/2018 02/07/2017 01/30/2016  PHQ - 2 Score 0 0 0 0  PHQ- 9 Score 0 0 1 -     Cognitive Function MMSE - Mini Mental State Exam 02/26/2019 02/18/2018 02/07/2017  Orientation to time 5 5 5   Orientation to Place 5 5 5   Registration 3 3 3   Attention/ Calculation 5 0 0  Recall 3 3 3   Language- name 2 objects - 0 0  Language- repeat 1 1 1   Language- follow 3 step command - 3 3  Language- read & follow direction - 0  0  Write a sentence - 0 0  Copy design - 0 0  Total score - 20 20     Mini Cog  Mini-Cog screen was completed. Maximum score is 22. A value of 0 denotes this part of the MMSE was not completed or the patient failed this part of the Mini-Cog screening.    Immunization History  Administered Date(s) Administered   Influenza Split 03/16/2011, 03/12/2012   Influenza Whole 06/04/2002, 04/24/2007, 03/10/2008, 03/31/2009, 04/18/2010   Influenza, High Dose Seasonal PF  03/18/2013   Influenza,inj,Quad PF,6+ Mos 03/10/2014, 01/30/2016, 02/07/2017, 02/18/2018   Pneumococcal Conjugate-13 03/10/2014   Pneumococcal Polysaccharide-23 03/23/2011   Td 06/08/1996   Zoster 06/05/2007   Zoster Recombinat (Shingrix) 02/25/2017    Qualifies for Shingles Vaccine? yes  Screening Tests Health Maintenance  Topic Date Due   INFLUENZA VACCINE  01/03/2019   DTaP/Tdap/Td (1 - Tdap) 02/08/2027 (Originally 06/27/1951)   TETANUS/TDAP  02/08/2027 (Originally 06/08/2006)   HEMOGLOBIN A1C  05/27/2019   FOOT EXAM  11/25/2019   OPHTHALMOLOGY EXAM  12/26/2019   DEXA SCAN  Completed   PNA vac Low Risk Adult  Completed    Cancer Screenings: Lung: Low Dose CT Chest recommended if Age 62-80 years, 30 pack-year currently smoking OR have quit w/in 15years. Patient does not qualify. Breast:  Up to date on Mammogram? Yes, no longer required  Up to date of Bone Density/Dexa? Yes, completed 03/31/2014 Colorectal: no longer required  Additional Screenings: : Hepatitis C Screening: n/a     Plan:    Patient wants to maintain and continue taking medications as prescribed.    I have personally reviewed and noted the following in the patients chart:    Medical and social history  Use of alcohol, tobacco or illicit drugs   Current medications and supplements  Functional ability and status  Nutritional status  Physical activity  Advanced directives  List of other physicians  Hospitalizations, surgeries, and ER visits in previous 12 months  Vitals  Screenings to include cognitive, depression, and falls  Referrals and appointments  In addition, I have reviewed and discussed with patient certain preventive protocols, quality metrics, and best practice recommendations. A written personalized care plan for preventive services as well as general preventive health recommendations were provided to patient.     Andrez Grime, LPN  [id number]

## 2019-02-26 NOTE — Progress Notes (Signed)
PCP notes: none  Health Maintenance: Patient wants to get her flu vaccine at her physical next week.  Patient declined Tdap vaccine.  Advised patient that she is still due for Shingrix #2. Received first dose at Dr. Clayborn Heron office about 2 years ago.     Abnormal Screenings: none    Patient concerns: none    Nurse concerns: none    Next PCP appt.: 03/03/2019 @ 8:30 am

## 2019-03-03 ENCOUNTER — Other Ambulatory Visit: Payer: Self-pay

## 2019-03-03 ENCOUNTER — Ambulatory Visit (INDEPENDENT_AMBULATORY_CARE_PROVIDER_SITE_OTHER): Payer: Medicare Other | Admitting: Family Medicine

## 2019-03-03 ENCOUNTER — Telehealth: Payer: Self-pay | Admitting: Family Medicine

## 2019-03-03 ENCOUNTER — Encounter: Payer: Self-pay | Admitting: Family Medicine

## 2019-03-03 VITALS — BP 174/82 | HR 85 | Temp 97.7°F | Ht 60.0 in | Wt 122.4 lb

## 2019-03-03 DIAGNOSIS — M8580 Other specified disorders of bone density and structure, unspecified site: Secondary | ICD-10-CM

## 2019-03-03 DIAGNOSIS — I739 Peripheral vascular disease, unspecified: Secondary | ICD-10-CM

## 2019-03-03 DIAGNOSIS — Z23 Encounter for immunization: Secondary | ICD-10-CM

## 2019-03-03 DIAGNOSIS — Z0000 Encounter for general adult medical examination without abnormal findings: Secondary | ICD-10-CM | POA: Diagnosis not present

## 2019-03-03 DIAGNOSIS — E1121 Type 2 diabetes mellitus with diabetic nephropathy: Secondary | ICD-10-CM

## 2019-03-03 DIAGNOSIS — Z794 Long term (current) use of insulin: Secondary | ICD-10-CM

## 2019-03-03 DIAGNOSIS — E1122 Type 2 diabetes mellitus with diabetic chronic kidney disease: Secondary | ICD-10-CM | POA: Diagnosis not present

## 2019-03-03 DIAGNOSIS — Z85038 Personal history of other malignant neoplasm of large intestine: Secondary | ICD-10-CM

## 2019-03-03 DIAGNOSIS — E785 Hyperlipidemia, unspecified: Secondary | ICD-10-CM

## 2019-03-03 DIAGNOSIS — N183 Chronic kidney disease, stage 3 (moderate): Secondary | ICD-10-CM

## 2019-03-03 DIAGNOSIS — H4020X0 Unspecified primary angle-closure glaucoma, stage unspecified: Secondary | ICD-10-CM

## 2019-03-03 DIAGNOSIS — I10 Essential (primary) hypertension: Secondary | ICD-10-CM

## 2019-03-03 MED ORDER — RAMIPRIL 10 MG PO CAPS: 10.0000 mg | ORAL_CAPSULE | Freq: Every day | ORAL | 3 refills | Status: DC

## 2019-03-03 MED ORDER — CARVEDILOL 25 MG PO TABS: 25.0000 mg | ORAL_TABLET | Freq: Two times a day (BID) | ORAL | 3 refills | Status: DC

## 2019-03-03 MED ORDER — ATORVASTATIN CALCIUM 20 MG PO TABS: 20.0000 mg | ORAL_TABLET | Freq: Every day | ORAL | 3 refills | Status: DC

## 2019-03-03 MED ORDER — FUROSEMIDE 40 MG PO TABS: 20.0000 mg | ORAL_TABLET | Freq: Every day | ORAL | 3 refills | Status: DC

## 2019-03-03 MED ORDER — POTASSIUM CHLORIDE CRYS ER 10 MEQ PO TBCR: EXTENDED_RELEASE_TABLET | ORAL | 3 refills | Status: DC

## 2019-03-03 NOTE — Telephone Encounter (Signed)
I do not see any message/results showing someone from our office called pt.

## 2019-03-03 NOTE — Assessment & Plan Note (Signed)
Continue to encourage good calcium rich diet, weight bearing exercise.

## 2019-03-03 NOTE — Patient Instructions (Addendum)
Flu shot today If interested, check with pharmacy about new 2 shot shingles series (shingrix).  Call [facility] to schedule mammogram.  Sugars overall doing ok.  Cholesterol levels were much worse (LDL >200!). Make sure you're taking lipitor at home - I've refilled it for you.  Blood pressures staying too high - increase ramipril to 10mg  daily - new dose at pharmacy. Continue other medicines as up to now.  Touch base with Dr Chalmers Cater about possible continuous glucose meter.  Return in 1 month for blood pressure f/u visit.    Health Maintenance After Age 41 After age 16, you are at a higher risk for certain long-term diseases and infections as well as injuries from falls. Falls are a major cause of broken bones and head injuries in people who are older than age 14. Getting regular preventive care can help to keep you healthy and well. Preventive care includes getting regular testing and making lifestyle changes as recommended by your health care provider. Talk with your health care provider about:  Which screenings and tests you should have. A screening is a test that checks for a disease when you have no symptoms.  A diet and exercise plan that is right for you. What should I know about screenings and tests to prevent falls? Screening and testing are the best ways to find a health problem early. Early diagnosis and treatment give you the best chance of managing medical conditions that are common after age 27. Certain conditions and lifestyle choices may make you more likely to have a fall. Your health care provider may recommend:  Regular vision checks. Poor vision and conditions such as cataracts can make you more likely to have a fall. If you wear glasses, make sure to get your prescription updated if your vision changes.  Medicine review. Work with your health care provider to regularly review all of the medicines you are taking, including over-the-counter medicines. Ask your health care provider  about any side effects that may make you more likely to have a fall. Tell your health care provider if any medicines that you take make you feel dizzy or sleepy.  Osteoporosis screening. Osteoporosis is a condition that causes the bones to get weaker. This can make the bones weak and cause them to break more easily.  Blood pressure screening. Blood pressure changes and medicines to control blood pressure can make you feel dizzy.  Strength and balance checks. Your health care provider may recommend certain tests to check your strength and balance while standing, walking, or changing positions.  Foot health exam. Foot pain and numbness, as well as not wearing proper footwear, can make you more likely to have a fall.  Depression screening. You may be more likely to have a fall if you have a fear of falling, feel emotionally low, or feel unable to do activities that you used to do.  Alcohol use screening. Using too much alcohol can affect your balance and may make you more likely to have a fall. What actions can I take to lower my risk of falls? General instructions  Talk with your health care provider about your risks for falling. Tell your health care provider if: ? You fall. Be sure to tell your health care provider about all falls, even ones that seem minor. ? You feel dizzy, sleepy, or off-balance.  Take over-the-counter and prescription medicines only as told by your health care provider. These include any supplements.  Eat a healthy diet and maintain a healthy  weight. A healthy diet includes low-fat dairy products, low-fat (lean) meats, and fiber from whole grains, beans, and lots of fruits and vegetables. Home safety  Remove any tripping hazards, such as rugs, cords, and clutter.  Install safety equipment such as grab bars in bathrooms and safety rails on stairs.  Keep rooms and walkways well-lit. Activity   Follow a regular exercise program to stay fit. This will help you  maintain your balance. Ask your health care provider what types of exercise are appropriate for you.  If you need a cane or walker, use it as recommended by your health care provider.  Wear supportive shoes that have nonskid soles. Lifestyle  Do not drink alcohol if your health care provider tells you not to drink.  If you drink alcohol, limit how much you have: ? 0-1 drink a day for women. ? 0-2 drinks a day for men.  Be aware of how much alcohol is in your drink. In the [place] one drink equals one typical bottle of beer (12 oz), one-half glass of wine (5 oz), or one shot of hard liquor (1 oz).  Do not use any products that contain nicotine or tobacco, such as cigarettes and e-cigarettes. If you need help quitting, ask your health care provider. Summary  Having a healthy lifestyle and getting preventive care can help to protect your health and wellness after age 63.  Screening and testing are the best way to find a health problem early and help you avoid having a fall. Early diagnosis and treatment give you the best chance for managing medical conditions that are more common for people who are older than age 38.  Falls are a major cause of broken bones and head injuries in people who are older than age 17. Take precautions to prevent a fall at home.  Work with your health care provider to learn what changes you can make to improve your health and wellness and to prevent falls. This information is not intended to replace advice given to you by your health care provider. Make sure you discuss any questions you have with your health care provider. Document Released: 04/03/2017 Document Revised: 09/11/2018 Document Reviewed: 04/03/2017 Elsevier Patient Education  2020 [employer].

## 2019-03-03 NOTE — Assessment & Plan Note (Signed)
Chronic, deteriorated. Increase ramipril to 10mg  daily. Continue carvedilol and lasix. RTC 1 mo close f/u.

## 2019-03-03 NOTE — Assessment & Plan Note (Signed)
Chronic, stable. Sees Balan. To check with her about CGM - seems to qualify.

## 2019-03-03 NOTE — Assessment & Plan Note (Signed)
Chronic, marked deterioration. Pt initially thought she was regularly taking lipitor and that Dr Chalmers Cater has been filling lipitor. However I think she has accidentally stopped statin - as we last filled 01/2018 for 6 months only. Advised to check at home. I have refilled atorvastatin 20mg  daily to restart. She agrees with plan.  The ASCVD Risk score Mikey Bussing DC Jr., et al., 2013) failed to calculate for the following reasons:   The 2013 ASCVD risk score is only valid for ages 75 to 5

## 2019-03-03 NOTE — Assessment & Plan Note (Signed)
Chronic, stable. Continue to monitor.

## 2019-03-03 NOTE — Telephone Encounter (Signed)
Patient received a call from our office and she is not sure what it could be in regards to  I did not see anything documented

## 2019-03-03 NOTE — Progress Notes (Signed)
This visit was conducted in person.  BP (!) 174/82 (BP Location: Right Arm, Patient Position: Sitting, Cuff Size: Normal)   Pulse 85   Temp 97.7 F (36.5 C) (Temporal)   Ht 5' (1.524 m)   Wt 122 lb 7 oz (55.5 kg)   SpO2 97%   BMI 23.91 kg/m   BP markedly elevated on repeat  CC: CPE Subjective:    Patient ID: Taylor Castaneda, female    DOB: September 07, 1932, 85 y.o.   MRN: [id number]  HPI: Taylor Castaneda is a 85 y.o. female presenting on 03/03/2019 for Annual Exam (Prt 2. )   Saw health advisor last week for medicare wellness visit. Note reviewed.   No noted hearing loss during visit.  No exam data present    Clinical Support from 02/26/2019 in [place] [facility] [place]  PHQ-2 Total Score  0      Fall Risk  02/26/2019 02/18/2018 02/07/2017 01/30/2016 03/10/2014  Falls in the past year? 0 No Yes No No  Comment - - pt reports she blacked out and fell causing bruising to left foot - -  Number falls in past yr: 0 - 1 - -  Injury with Fall? - - Yes - -  Risk for fall due to : Medication side effect - - - -  Follow up Falls evaluation completed;Falls prevention discussed - - - -       HTN - last visit we increased ramipril to 5mg  daily - BP persistently elevated. Checks at home but doesn't remember readings.   DM - checks sugars 4 times a day, doing well on daily lantus and humalog TID AC. Followed by Dr Chalmers Cater endocrinology  Preventative: COLONOSCOPY Date: 06/2013 1 hyperplastic polyp, ileocecal anastomosis [facility] Plan)  Well woman with OBGYN Dr [email or web address] - last mammogram 2015. Last seen 2015. Declines return. She does breast exams at home without concerns. Suggested return to [facility] for mammo.  DEXA - Date:11/2015T scorestable at-2.2at hip.Calcium stopped by endo. Good calcium in diet. Walks regularly.  Fluyearly Pneumovax 2012, prevnar 2015 Td 1998.  zostavax 2009  shingrix - discussed - has not received yet  Advanced directive - scanned and in chart 02/2018. HCPOA  are 10-23-1969 (daughter) then [name] (son). Does not want prolonged life support if terminal condition.  Seat belt use discussed  Sunscreen use discussed. No changing moles on skin. Exsmoker- quit 1975 Alcohol -1 glass wine/week Dentist not regularly - has dentures Eye exam Q6 mo  Bowel - no constipation Bladder - no incontinence  Lives alone, no pets Neighbors keep an eye on each other - 12 unit condo.  Family nearby. Occ: retired [employer] Activity: walking, daily water aerobics at [employer] Diet: good water, fruits/vegetables daily     Relevant past medical, surgical, family and social history reviewed and updated as indicated. Interim medical history since our last visit reviewed. Allergies and medications reviewed and updated. Outpatient Medications Prior to Visit  Medication Sig Dispense Refill  . B-D ULTRAFINE III SHORT PEN 31G X 8 MM MISC USE AS DIRECTED WITH INSULIN 100 each 3  . Carboxymethylcellul-Glycerin (CLEAR EYES FOR DRY EYES) 1-0.25 % SOLN Place 1-2 drops into both eyes 3 (three) times daily as needed (for dry/irritated eyes.).    10-25-2001 Cholecalciferol (VITAMIN D3 PO) Take 2,500 Units by mouth daily.    . colchicine 0.6 MG tablet Take 1 tablet (0.6 mg total) by mouth daily as needed (gout flare). First day of gout flare, may  take 1 tablet twice daily. 30 tablet 1  . glucose blood (FREESTYLE LITE) test strip Use as instructed (Patient taking differently: 1 each by Other route 4 (four) times daily. Use as instructed) 100 each 2  . HUMALOG KWIKPEN 100 UNIT/ML KiwkPen Inject 10 Units into the skin 3 (three) times daily before meals.  4  . LANTUS SOLOSTAR 100 UNIT/ML Solostar Pen Inject 30 Units into the skin at bedtime.    Marland Kitchen atorvastatin (LIPITOR) 40 MG tablet TAKE 1/2 (ONE-HALF) TABLET BY MOUTH ONCE DAILY 45 tablet 1  . carvedilol (COREG) 25 MG tablet TAKE 1 TABLET BY MOUTH TWICE DAILY 180 tablet 2  . furosemide (LASIX) 40 MG tablet Take 1/2 (one-half) tablet by  mouth once daily 45 tablet 0  . potassium chloride (K-DUR) 10 MEQ tablet TAKE 1  BY MOUTH ON MONDAY, WEDNESDAY AND FRIDAY 36 tablet 0  . ramipril (ALTACE) 5 MG capsule Take 1 capsule (5 mg total) by mouth daily. 90 capsule 1   No facility-administered medications prior to visit.      Per HPI unless specifically indicated in ROS section below Review of Systems  Constitutional: Negative for activity change, appetite change, chills, fatigue, fever and unexpected weight change.  HENT: Negative for hearing loss.   Eyes: Negative for visual disturbance.  Respiratory: Negative for cough, chest tightness, shortness of breath and wheezing.   Cardiovascular: Negative for chest pain, palpitations and leg swelling.  Gastrointestinal: Negative for abdominal distention, abdominal pain, blood in stool, constipation, diarrhea, nausea and vomiting.  Genitourinary: Negative for difficulty urinating and hematuria.  Musculoskeletal: Negative for arthralgias, myalgias and neck pain.  Skin: Negative for rash.  Neurological: Negative for dizziness, seizures, syncope and headaches.  Hematological: Negative for adenopathy. Does not bruise/bleed easily.  Psychiatric/Behavioral: Negative for dysphoric mood. The patient is not nervous/anxious.    Objective:    BP (!) 174/82 (BP Location: Right Arm, Patient Position: Sitting, Cuff Size: Normal)   Pulse 85   Temp 97.7 F (36.5 C) (Temporal)   Ht 5' (1.524 m)   Wt 122 lb 7 oz (55.5 kg)   SpO2 97%   BMI 23.91 kg/m   Wt Readings from Last 3 Encounters:  03/03/19 122 lb 7 oz (55.5 kg)  01/28/19 120 lb 7 oz (54.6 kg)  11/25/18 123 lb 1 oz (55.8 kg)    Physical Exam Vitals signs and nursing note reviewed.  Constitutional:      General: She is not in acute distress.    Appearance: Normal appearance. She is well-developed. She is not ill-appearing.  HENT:     Head: Normocephalic and atraumatic.     Right Ear: Hearing, tympanic membrane, ear canal and external  ear normal.     Left Ear: Hearing, tympanic membrane, ear canal and external ear normal.     Nose: Nose normal.     Mouth/Throat:     Mouth: Mucous membranes are moist.     Pharynx: Oropharynx is clear. Uvula midline. No posterior oropharyngeal erythema.  Eyes:     General: No scleral icterus.    Extraocular Movements: Extraocular movements intact.     Conjunctiva/sclera: Conjunctivae normal.     Pupils: Pupils are equal, round, and reactive to light.  Neck:     Musculoskeletal: Normal range of motion and neck supple.  Cardiovascular:     Rate and Rhythm: Normal rate and regular rhythm.     Pulses: Normal pulses.          Radial pulses are  2+ on the right side and 2+ on the left side.     Heart sounds: Normal heart sounds. No murmur.  Pulmonary:     Effort: Pulmonary effort is normal. No respiratory distress.     Breath sounds: Normal breath sounds. No wheezing, rhonchi or rales.  Abdominal:     General: Abdomen is flat. Bowel sounds are normal. There is no distension.     Palpations: Abdomen is soft. There is no mass.     Tenderness: There is no abdominal tenderness. There is no guarding or rebound.     Hernia: No hernia is present.  Musculoskeletal: Normal range of motion.     Right lower leg: No edema.     Left lower leg: No edema.  Lymphadenopathy:     Cervical: No cervical adenopathy.  Skin:    General: Skin is warm and dry.     Findings: No rash.  Neurological:     General: No focal deficit present.     Mental Status: She is alert and oriented to person, place, and time.     Comments: CN grossly intact, station and gait intact  Psychiatric:        Mood and Affect: Mood normal.        Behavior: Behavior normal.        Thought Content: Thought content normal.        Judgment: Judgment normal.       Results for orders placed or performed in visit on 02/26/19  VITAMIN D 25 Hydroxy (Vit-D Deficiency, Fractures)  Result Value Ref Range   VITD 45.81 30.00 - 100.00  ng/mL  Uric acid  Result Value Ref Range   Uric Acid, Serum 7.1 (H) 2.4 - 7.0 mg/dL  Hemoglobin A1c  Result Value Ref Range   Hgb A1c MFr Bld 7.4 (H) 4.6 - 6.5 %  Comprehensive metabolic panel  Result Value Ref Range   Sodium 139 135 - 145 mEq/L   Potassium 3.9 3.5 - 5.1 mEq/L   Chloride 103 96 - 112 mEq/L   CO2 25 19 - 32 mEq/L   Glucose, Bld 82 70 - 99 mg/dL   BUN 25 (H) 6 - 23 mg/dL   Creatinine, Ser 1.20 0.40 - 1.20 mg/dL   Total Bilirubin 0.7 0.2 - 1.2 mg/dL   Alkaline Phosphatase 115 39 - 117 U/L   AST 18 0 - 37 U/L   ALT 17 0 - 35 U/L   Total Protein 7.0 6.0 - 8.3 g/dL   Albumin 4.0 3.5 - 5.2 g/dL   Calcium 9.6 8.4 - 10.5 mg/dL   GFR 42.73 (L) >60.00 mL/min  Lipid panel  Result Value Ref Range   Cholesterol 275 (H) 0 - 200 mg/dL   Triglycerides 149.0 0.0 - 149.0 mg/dL   HDL 39.80 >39.00 mg/dL   VLDL 29.8 0.0 - 40.0 mg/dL   LDL Cholesterol 206 (H) 0 - 99 mg/dL   Total CHOL/HDL Ratio 7    NonHDL 235.50    Assessment & Plan:   Problem List Items Addressed This Visit    Primary angle-closure glaucoma   PAD (peripheral artery disease) (HCC)    Continue aspirin, restart statin.  Previously endorsed claudication, declined further vascular evaluation for this.       Relevant Medications   atorvastatin (LIPITOR) 20 MG tablet   carvedilol (COREG) 25 MG tablet   ramipril (ALTACE) 10 MG capsule   furosemide (LASIX) 40 MG tablet   Osteopenia    Continue  to encourage good calcium rich diet, weight bearing exercise.       HLD (hyperlipidemia)    Chronic, marked deterioration. Pt initially thought she was regularly taking lipitor and that Dr Chalmers Cater has been filling lipitor. However I think she has accidentally stopped statin - as we last filled 01/2018 for 6 months only. Advised to check at home. I have refilled atorvastatin 20mg  daily to restart. She agrees with plan.  The ASCVD Risk score Mikey Bussing DC Jr., et al., 2013) failed to calculate for the following reasons:   The  2013 ASCVD risk score is only valid for ages 55 to 48       Relevant Medications   atorvastatin (LIPITOR) 20 MG tablet   carvedilol (COREG) 25 MG tablet   ramipril (ALTACE) 10 MG capsule   furosemide (LASIX) 40 MG tablet   History of colon cancer   Health maintenance examination - Primary    Preventative protocols reviewed and updated unless pt declined. Discussed healthy diet and lifestyle.       Essential hypertension    Chronic, deteriorated. Increase ramipril to 10mg  daily. Continue carvedilol and lasix. RTC 1 mo close f/u.       Relevant Medications   atorvastatin (LIPITOR) 20 MG tablet   carvedilol (COREG) 25 MG tablet   ramipril (ALTACE) 10 MG capsule   furosemide (LASIX) 40 MG tablet   Controlled type 2 diabetes mellitus with diabetic nephropathy, with long-term current use of insulin (HCC)    Chronic, stable. Sees Balan. To check with her about CGM - seems to qualify.       Relevant Medications   atorvastatin (LIPITOR) 20 MG tablet   ramipril (ALTACE) 10 MG capsule   CKD stage 3 due to type 2 diabetes mellitus (HCC)    Chronic, stable. Continue to monitor.      Relevant Medications   atorvastatin (LIPITOR) 20 MG tablet   ramipril (ALTACE) 10 MG capsule    Other Visit Diagnoses    Need for influenza vaccination       Relevant Orders   Flu Vaccine QUAD High Dose(Fluad) (Completed)       Meds ordered this encounter  Medications  . atorvastatin (LIPITOR) 20 MG tablet    Sig: Take 1 tablet (20 mg total) by mouth daily at 6 PM.    Dispense:  90 tablet    Refill:  3  . carvedilol (COREG) 25 MG tablet    Sig: Take 1 tablet (25 mg total) by mouth 2 (two) times daily.    Dispense:  180 tablet    Refill:  3  . ramipril (ALTACE) 10 MG capsule    Sig: Take 1 capsule (10 mg total) by mouth daily.    Dispense:  90 capsule    Refill:  3    Note new sig  . potassium chloride (K-DUR) 10 MEQ tablet    Sig: TAKE 1  BY MOUTH ON MONDAY, WEDNESDAY AND FRIDAY     Dispense:  36 tablet    Refill:  3  . furosemide (LASIX) 40 MG tablet    Sig: Take 0.5 tablets (20 mg total) by mouth daily.    Dispense:  45 tablet    Refill:  3   Orders Placed This Encounter  Procedures  . Flu Vaccine QUAD High Dose(Fluad)    Patient instructions: Flu shot today If interested, check with pharmacy about new 2 shot shingles series (shingrix).  Call [facility] to schedule mammogram.  Sugars overall doing ok.  Cholesterol levels were much worse (LDL >200!). Make sure you're taking lipitor at home - I've refilled it for you.  Blood pressures staying too high - increase ramipril to 10mg  daily - new dose at pharmacy. Continue other medicines as up to now.  Touch base with Dr Chalmers Cater about possible continuous glucose meter.  Return in 1 month for blood pressure f/u visit.   Follow up plan: Return in about 4 weeks (around 03/31/2019) for follow up visit.  [name], MD

## 2019-03-03 NOTE — Assessment & Plan Note (Signed)
Preventative protocols reviewed and updated unless pt declined. Discussed healthy diet and lifestyle.  

## 2019-03-03 NOTE — Assessment & Plan Note (Signed)
Continue aspirin, restart statin.  Previously endorsed claudication, declined further vascular evaluation for this.

## 2019-03-05 ENCOUNTER — Emergency Department (HOSPITAL_COMMUNITY): Payer: Medicare Other

## 2019-03-05 ENCOUNTER — Inpatient Hospital Stay (HOSPITAL_COMMUNITY)
Admission: EM | Admit: 2019-03-05 | Discharge: 2019-03-07 | DRG: 101 | Disposition: A | Discharge status: Home / Self Care | Payer: Medicare Other | Attending: Family Medicine | Admitting: Family Medicine

## 2019-03-05 ENCOUNTER — Inpatient Hospital Stay (HOSPITAL_COMMUNITY): Payer: Medicare Other

## 2019-03-05 ENCOUNTER — Other Ambulatory Visit: Payer: Self-pay

## 2019-03-05 DIAGNOSIS — Z85038 Personal history of other malignant neoplasm of large intestine: Secondary | ICD-10-CM

## 2019-03-05 DIAGNOSIS — Z79899 Other long term (current) drug therapy: Secondary | ICD-10-CM | POA: Diagnosis not present

## 2019-03-05 DIAGNOSIS — E118 Type 2 diabetes mellitus with unspecified complications: Secondary | ICD-10-CM

## 2019-03-05 DIAGNOSIS — M109 Gout, unspecified: Secondary | ICD-10-CM | POA: Diagnosis present

## 2019-03-05 DIAGNOSIS — M1710 Unilateral primary osteoarthritis, unspecified knee: Secondary | ICD-10-CM | POA: Diagnosis present

## 2019-03-05 DIAGNOSIS — I10 Essential (primary) hypertension: Secondary | ICD-10-CM | POA: Diagnosis not present

## 2019-03-05 DIAGNOSIS — R531 Weakness: Secondary | ICD-10-CM | POA: Diagnosis not present

## 2019-03-05 DIAGNOSIS — D631 Anemia in chronic kidney disease: Secondary | ICD-10-CM | POA: Diagnosis present

## 2019-03-05 DIAGNOSIS — E1122 Type 2 diabetes mellitus with diabetic chronic kidney disease: Secondary | ICD-10-CM | POA: Diagnosis not present

## 2019-03-05 DIAGNOSIS — Z794 Long term (current) use of insulin: Secondary | ICD-10-CM | POA: Diagnosis not present

## 2019-03-05 DIAGNOSIS — Z20828 Contact with and (suspected) exposure to other viral communicable diseases: Secondary | ICD-10-CM | POA: Diagnosis present

## 2019-03-05 DIAGNOSIS — N189 Chronic kidney disease, unspecified: Secondary | ICD-10-CM | POA: Diagnosis present

## 2019-03-05 DIAGNOSIS — H409 Unspecified glaucoma: Secondary | ICD-10-CM | POA: Diagnosis present

## 2019-03-05 DIAGNOSIS — M179 Osteoarthritis of knee, unspecified: Secondary | ICD-10-CM | POA: Diagnosis present

## 2019-03-05 DIAGNOSIS — R4701 Aphasia: Secondary | ICD-10-CM | POA: Diagnosis not present

## 2019-03-05 DIAGNOSIS — R414 Neurologic neglect syndrome: Secondary | ICD-10-CM | POA: Diagnosis present

## 2019-03-05 DIAGNOSIS — R29719 NIHSS score 19: Secondary | ICD-10-CM | POA: Diagnosis present

## 2019-03-05 DIAGNOSIS — N183 Chronic kidney disease, stage 3 unspecified: Secondary | ICD-10-CM | POA: Diagnosis present

## 2019-03-05 DIAGNOSIS — Z833 Family history of diabetes mellitus: Secondary | ICD-10-CM

## 2019-03-05 DIAGNOSIS — E119 Type 2 diabetes mellitus without complications: Secondary | ICD-10-CM

## 2019-03-05 DIAGNOSIS — Z9049 Acquired absence of other specified parts of digestive tract: Secondary | ICD-10-CM

## 2019-03-05 DIAGNOSIS — R0902 Hypoxemia: Secondary | ICD-10-CM | POA: Diagnosis not present

## 2019-03-05 DIAGNOSIS — R4182 Altered mental status, unspecified: Secondary | ICD-10-CM

## 2019-03-05 DIAGNOSIS — Z96652 Presence of left artificial knee joint: Secondary | ICD-10-CM | POA: Diagnosis not present

## 2019-03-05 DIAGNOSIS — R4702 Dysphasia: Secondary | ICD-10-CM | POA: Diagnosis not present

## 2019-03-05 DIAGNOSIS — I4891 Unspecified atrial fibrillation: Secondary | ICD-10-CM | POA: Diagnosis not present

## 2019-03-05 DIAGNOSIS — R29818 Other symptoms and signs involving the nervous system: Secondary | ICD-10-CM | POA: Diagnosis not present

## 2019-03-05 DIAGNOSIS — G40901 Epilepsy, unspecified, not intractable, with status epilepticus: Principal | ICD-10-CM | POA: Diagnosis present

## 2019-03-05 DIAGNOSIS — I129 Hypertensive chronic kidney disease with stage 1 through stage 4 chronic kidney disease, or unspecified chronic kidney disease: Secondary | ICD-10-CM | POA: Diagnosis present

## 2019-03-05 DIAGNOSIS — Z87891 Personal history of nicotine dependence: Secondary | ICD-10-CM

## 2019-03-05 DIAGNOSIS — E1151 Type 2 diabetes mellitus with diabetic peripheral angiopathy without gangrene: Secondary | ICD-10-CM | POA: Diagnosis present

## 2019-03-05 DIAGNOSIS — Z9181 History of falling: Secondary | ICD-10-CM | POA: Diagnosis not present

## 2019-03-05 DIAGNOSIS — R404 Transient alteration of awareness: Secondary | ICD-10-CM | POA: Diagnosis not present

## 2019-03-05 DIAGNOSIS — D72829 Elevated white blood cell count, unspecified: Secondary | ICD-10-CM | POA: Diagnosis present

## 2019-03-05 DIAGNOSIS — E785 Hyperlipidemia, unspecified: Secondary | ICD-10-CM | POA: Diagnosis present

## 2019-03-05 DIAGNOSIS — E1121 Type 2 diabetes mellitus with diabetic nephropathy: Secondary | ICD-10-CM

## 2019-03-05 DIAGNOSIS — E1165 Type 2 diabetes mellitus with hyperglycemia: Secondary | ICD-10-CM | POA: Diagnosis present

## 2019-03-05 DIAGNOSIS — I6522 Occlusion and stenosis of left carotid artery: Secondary | ICD-10-CM | POA: Diagnosis not present

## 2019-03-05 DIAGNOSIS — Z823 Family history of stroke: Secondary | ICD-10-CM

## 2019-03-05 LAB — COMPREHENSIVE METABOLIC PANEL
ALT: 19 U/L (ref 0–44)
AST: 21 U/L (ref 15–41)
Albumin: 3.8 g/dL (ref 3.5–5.0)
Alkaline Phosphatase: 111 U/L (ref 38–126)
Anion gap: 15 (ref 5–15)
BUN: 32 mg/dL — ABNORMAL HIGH (ref 8–23)
CO2: 20 mmol/L — ABNORMAL LOW (ref 22–32)
Calcium: 9.6 mg/dL (ref 8.9–10.3)
Chloride: 103 mmol/L (ref 98–111)
Creatinine, Ser: 1.22 mg/dL — ABNORMAL HIGH (ref 0.44–1.00)
GFR calc Af Amer: 47 mL/min — ABNORMAL LOW (ref >60)
GFR calc non Af Amer: 41 mL/min — ABNORMAL LOW (ref >60)
Glucose, Bld: 204 mg/dL — ABNORMAL HIGH (ref 70–99)
Potassium: 5 mmol/L (ref 3.5–5.1)
Sodium: 138 mmol/L (ref 135–145)
Total Bilirubin: 1.3 mg/dL — ABNORMAL HIGH (ref 0.3–1.2)
Total Protein: 6.9 g/dL (ref 6.5–8.1)

## 2019-03-05 LAB — DIFFERENTIAL
Abs Immature Granulocytes: 0.07 10*3/uL (ref 0.00–0.07)
Basophils Absolute: 0.1 10*3/uL (ref 0.0–0.1)
Basophils Relative: 0 %
Eosinophils Absolute: 0 10*3/uL (ref 0.0–0.5)
Eosinophils Relative: 0 %
Immature Granulocytes: 1 %
Lymphocytes Relative: 8 %
Lymphs Abs: 1.2 10*3/uL (ref 0.7–4.0)
Monocytes Absolute: 0.7 10*3/uL (ref 0.1–1.0)
Monocytes Relative: 5 %
Neutro Abs: 12.9 10*3/uL — ABNORMAL HIGH (ref 1.7–7.7)
Neutrophils Relative %: 86 %

## 2019-03-05 LAB — CBC
HCT: 40.9 % (ref 36.0–46.0)
Hemoglobin: 14.2 g/dL (ref 12.0–15.0)
MCH: 32.3 pg (ref 26.0–34.0)
MCHC: 34.7 g/dL (ref 30.0–36.0)
MCV: 93.2 fL (ref 80.0–100.0)
Platelets: 221 10*3/uL (ref 150–400)
RBC: 4.39 MIL/uL (ref 3.87–5.11)
RDW: 13.2 % (ref 11.5–15.5)
WBC: 14.9 10*3/uL — ABNORMAL HIGH (ref 4.0–10.5)
nRBC: 0 % (ref 0.0–0.2)

## 2019-03-05 LAB — APTT: aPTT: 33 seconds (ref 24–36)

## 2019-03-05 LAB — I-STAT CHEM 8, ED
BUN: 38 mg/dL — ABNORMAL HIGH (ref 8–23)
Calcium, Ion: 1.11 mmol/L — ABNORMAL LOW (ref 1.15–1.40)
Chloride: 107 mmol/L (ref 98–111)
Creatinine, Ser: 1 mg/dL (ref 0.44–1.00)
Glucose, Bld: 194 mg/dL — ABNORMAL HIGH (ref 70–99)
HCT: 39 % (ref 36.0–46.0)
Hemoglobin: 13.3 g/dL (ref 12.0–15.0)
Potassium: 5.4 mmol/L — ABNORMAL HIGH (ref 3.5–5.1)
Sodium: 138 mmol/L (ref 135–145)
TCO2: 22 mmol/L (ref 22–32)

## 2019-03-05 LAB — PROTIME-INR
INR: 1.1 (ref 0.8–1.2)
Prothrombin Time: 14.3 seconds (ref 11.4–15.2)

## 2019-03-05 LAB — CBG MONITORING, ED: Glucose-Capillary: 194 mg/dL — ABNORMAL HIGH (ref 70–99)

## 2019-03-05 LAB — ETHANOL: Alcohol, Ethyl (B): <10 mg/dL (ref <10)

## 2019-03-05 MED ORDER — INSULIN ASPART 100 UNIT/ML ~~LOC~~ SOLN
0.0000 [IU] | Freq: Three times a day (TID) | SUBCUTANEOUS | Status: DC
  Administered 2019-03-06: 1 [IU] via SUBCUTANEOUS
  Administered 2019-03-06 – 2019-03-07 (×2): 2 [IU] via SUBCUTANEOUS

## 2019-03-05 MED ORDER — RAMIPRIL 10 MG PO CAPS
10.0000 mg | ORAL_CAPSULE | Freq: Every day | ORAL | Status: DC
  Administered 2019-03-07: 10 mg via ORAL
  Filled 2019-03-05: qty 1

## 2019-03-05 MED ORDER — INSULIN ASPART 100 UNIT/ML ~~LOC~~ SOLN: 0.0000 [IU] | Freq: Every day | SUBCUTANEOUS | Status: DC

## 2019-03-05 MED ORDER — LORAZEPAM 2 MG/ML IJ SOLN
2.0000 mg | Freq: Once | INTRAMUSCULAR | Status: AC
  Administered 2019-03-05: 23:00:00 2 mg via INTRAVENOUS

## 2019-03-05 MED ORDER — ACETAMINOPHEN 650 MG RE SUPP: 650.0000 mg | Freq: Four times a day (QID) | RECTAL | Status: DC | PRN

## 2019-03-05 MED ORDER — HALOPERIDOL LACTATE 5 MG/ML IJ SOLN
2.0000 mg | Freq: Four times a day (QID) | INTRAMUSCULAR | Status: DC | PRN
  Administered 2019-03-05: 22:00:00 2 mg via INTRAVENOUS

## 2019-03-05 MED ORDER — INSULIN GLARGINE 100 UNIT/ML ~~LOC~~ SOLN
30.0000 [IU] | Freq: Every day | SUBCUTANEOUS | Status: DC
  Filled 2019-03-05 (×2): qty 0.3

## 2019-03-05 MED ORDER — LORAZEPAM 2 MG/ML IJ SOLN
INTRAMUSCULAR | Status: AC
  Filled 2019-03-05: qty 1

## 2019-03-05 MED ORDER — LEVETIRACETAM IN NACL 1000 MG/100ML IV SOLN
1000.0000 mg | Freq: Two times a day (BID) | INTRAVENOUS | Status: DC
  Administered 2019-03-06: 1000 mg via INTRAVENOUS
  Filled 2019-03-05: qty 100

## 2019-03-05 MED ORDER — COLCHICINE 0.6 MG PO TABS: 0.6000 mg | ORAL_TABLET | Freq: Every day | ORAL | Status: DC | PRN

## 2019-03-05 MED ORDER — FUROSEMIDE 20 MG PO TABS
20.0000 mg | ORAL_TABLET | Freq: Every day | ORAL | Status: DC
  Administered 2019-03-06: 20 mg via ORAL
  Filled 2019-03-05: qty 1

## 2019-03-05 MED ORDER — HALOPERIDOL LACTATE 5 MG/ML IJ SOLN
INTRAMUSCULAR | Status: AC
  Administered 2019-03-05: 22:00:00 2 mg via INTRAVENOUS
  Filled 2019-03-05: qty 1

## 2019-03-05 MED ORDER — CARVEDILOL 25 MG PO TABS
25.0000 mg | ORAL_TABLET | Freq: Two times a day (BID) | ORAL | Status: DC
  Administered 2019-03-06 – 2019-03-07 (×3): 25 mg via ORAL
  Filled 2019-03-05: qty 1
  Filled 2019-03-05: qty 2
  Filled 2019-03-05: qty 1

## 2019-03-05 MED ORDER — VITAMIN D3 25 MCG (1000 UNIT) PO TABS
2500.0000 [IU] | ORAL_TABLET | Freq: Every day | ORAL | Status: DC
  Administered 2019-03-07: 2500 [IU] via ORAL
  Filled 2019-03-05: qty 2.5
  Filled 2019-03-05: qty 3

## 2019-03-05 MED ORDER — ENOXAPARIN SODIUM 40 MG/0.4ML ~~LOC~~ SOLN: 40.0000 mg | SUBCUTANEOUS | Status: DC

## 2019-03-05 MED ORDER — ACETAMINOPHEN 325 MG PO TABS: 650.0000 mg | ORAL_TABLET | Freq: Four times a day (QID) | ORAL | Status: DC | PRN

## 2019-03-05 MED ORDER — ATORVASTATIN CALCIUM 10 MG PO TABS
20.0000 mg | ORAL_TABLET | Freq: Every day | ORAL | Status: DC
  Administered 2019-03-06: 20 mg via ORAL
  Filled 2019-03-05: qty 2

## 2019-03-05 MED ORDER — ONDANSETRON HCL 4 MG PO TABS: 4.0000 mg | ORAL_TABLET | Freq: Four times a day (QID) | ORAL | Status: DC | PRN

## 2019-03-05 MED ORDER — SODIUM CHLORIDE 0.9 % IV BOLUS
500.0000 mL | Freq: Once | INTRAVENOUS | Status: AC
  Administered 2019-03-06: 500 mL via INTRAVENOUS

## 2019-03-05 MED ORDER — ONDANSETRON HCL 4 MG/2ML IJ SOLN: 4.0000 mg | Freq: Four times a day (QID) | INTRAMUSCULAR | Status: DC | PRN

## 2019-03-05 MED ORDER — INSULIN LISPRO (1 UNIT DIAL) 100 UNIT/ML (KWIKPEN): 10.0000 [IU] | PEN_INJECTOR | Freq: Three times a day (TID) | SUBCUTANEOUS | Status: DC

## 2019-03-05 MED ORDER — LORAZEPAM 2 MG/ML IJ SOLN
INTRAMUSCULAR | Status: AC
  Administered 2019-03-05: 23:00:00 2 mg via INTRAVENOUS
  Filled 2019-03-05: qty 1

## 2019-03-05 MED ORDER — IOHEXOL 350 MG/ML SOLN
100.0000 mL | Freq: Once | INTRAVENOUS | Status: AC | PRN
  Administered 2019-03-05: 100 mL via INTRAVENOUS

## 2019-03-05 MED ORDER — LEVETIRACETAM IN NACL 1000 MG/100ML IV SOLN: 1000.0000 mg | Freq: Two times a day (BID) | INTRAVENOUS | Status: DC

## 2019-03-05 MED ORDER — SODIUM CHLORIDE 0.9 % IV SOLN: 100.0000 mL/h | INTRAVENOUS | Status: DC

## 2019-03-05 MED ORDER — SODIUM CHLORIDE 0.9 % IV SOLN
INTRAVENOUS | Status: DC
  Administered 2019-03-06: 01:00:00 via INTRAVENOUS
  Administered 2019-03-06: 12:00:00 1000 mL via INTRAVENOUS

## 2019-03-05 NOTE — ED Provider Notes (Signed)
[facility] EMERGENCY DEPARTMENT Provider Note   CSN: [id number] Arrival date & time: 03/05/19  2113  An emergency department physician performed an initial assessment on this suspected stroke patient at 2123.  History   Chief Complaint Chief Complaint  Patient presents with  . Code Stroke    HPI Taylor Castaneda is a 85 y.o. female.     HPI   The patient presents from home via EMS for altered mental status. She was found down at home and last seen normal at 8:00am. Ems noted on scene a right sided gaze deviation. She was transported to [facility] ED for further care.  On arrival, the patient was alert, disoriented, with a right sided gaze deviation and left sided neglect. ABC were intact. Unable to obtain further history from the patient. A Tier 2 code stroke was called on arrival.   Past Medical History:  Diagnosis Date  . Anemia   . Diabetes mellitus, type II ([place])   . Glaucoma    Dr.Hecker  . History of colon cancer 1998   Dr. Lennie Hummer  . Hyperlipemia   . Osteopenia 02/2012, 03/2014   DEXA hip -2.2  . Renal insufficiency   . Stenosing tenosynovitis of finger of left hand 2016   index - s/p steroid injection x2    Patient Active Problem List   Diagnosis Date Noted  . Status epilepticus ([place]) 03/05/2019  . Weight loss 01/28/2019  . Acute gout 04/29/2017  . OA (osteoarthritis) of knee 12/17/2016  . Dry skin dermatitis 06/13/2016  . Health maintenance examination 01/30/2016  . Medicare annual wellness visit, subsequent 03/10/2014  . Advanced care planning/counseling discussion 03/10/2014  . PAD (peripheral artery disease) ([place]) 11/11/2013  . Dyspnea 09/18/2011  . PMR (polymyalgia rheumatica) ([place]) 08/27/2011  . Controlled type 2 diabetes mellitus with diabetic nephropathy, with long-term current use of insulin ([place]) 08/04/2009  . Essential hypertension 06/13/2009  . CKD stage 3 due to type 2 diabetes mellitus ([place]) 01/24/2009  . Primary  angle-closure glaucoma 11/13/2007  . Osteopenia 11/13/2007  . History of colon cancer 11/13/2007  . HLD (hyperlipidemia) 10/23/2006    Past Surgical History:  Procedure Laterality Date  . ABI  11/2013   WNL  . APPENDECTOMY    . CARPAL TUNNEL RELEASE     Bilateral   . CATARACT EXTRACTION Bilateral 2006   Implants  . COLECTOMY  1998   For cancer  . COLONOSCOPY  2008   Dr.Stark, Due 2011  . COLONOSCOPY  06/2013   1 hyperplastic polyp, ileocecal anastomosis [facility])  . dexa  02/2012, 03/2014   T score -2.2 at hip overall stable  . REFRACTIVE SURGERY    . TONSILLECTOMY AND ADENOIDECTOMY    . TOTAL KNEE ARTHROPLASTY Left 12/17/2016   Procedure: LEFT TOTAL KNEE ARTHROPLASTY;  Surgeon: Gaynelle Arabian, MD;  Location: [facility] ORS;  Service: Orthopedics;  Laterality: Left;     OB History   No obstetric history on file.      Home Medications    Prior to Admission medications   Medication Sig Start Date End Date Taking? Authorizing Provider  atorvastatin (LIPITOR) 20 MG tablet Take 1 tablet (20 mg total) by mouth daily at 6 PM. 03/03/19  Yes [name], MD  Carboxymethylcellul-Glycerin (CLEAR EYES FOR DRY EYES) 1-0.25 % SOLN Place 1-2 drops into both eyes 3 (three) times daily as needed (for dry/irritated eyes.).   Yes [provider]  carvedilol (COREG) 25 MG tablet Take 1 tablet (25  mg total) by mouth 2 (two) times daily. 03/03/19  Yes [name], MD  Cholecalciferol (VITAMIN D3 PO) Take 2,500 Units by mouth daily.   Yes [provider]  colchicine 0.6 MG tablet Take 1 tablet (0.6 mg total) by mouth daily as needed (gout flare). First day of gout flare, may take 1 tablet twice daily. 11/25/18  Yes [name], MD  furosemide (LASIX) 40 MG tablet Take 0.5 tablets (20 mg total) by mouth daily. 03/03/19  Yes [name], MD  HUMALOG KWIKPEN 100 UNIT/ML KiwkPen Inject 1-10 Units into the skin 3 (three) times daily before meals. Sliding scale 10/04/16  Yes  [provider]  LANTUS SOLOSTAR 100 UNIT/ML Solostar Pen Inject 30 Units into the skin at bedtime. 02/14/17  Yes [name], MD  potassium chloride (K-DUR) 10 MEQ tablet TAKE 1  BY MOUTH ON MONDAY, [place] Patient taking differently: Take 10 mEq by mouth every Monday, Wednesday, and Friday.  03/03/19  Yes [name], MD  ramipril (ALTACE) 10 MG capsule Take 1 capsule (10 mg total) by mouth daily. 03/03/19  Yes [name], MD  B-D ULTRAFINE III SHORT PEN 31G X 8 MM MISC USE AS DIRECTED WITH INSULIN 07/09/11   Hendricks Limes, MD  glucose blood (FREESTYLE LITE) test strip Use as instructed Patient taking differently: 1 each by Other route 4 (four) times daily. Use as instructed 02/09/11   Hendricks Limes, MD    Family History Family History  Problem Relation Age of Onset  . Heart attack Father 38  . Stroke Father 21  . Diabetes Father   . Hypertension Father   . Heart disease Father        before age 17  . Breast cancer Mother   . Cancer Mother   . Diabetes Sister   . Cancer Sister   . Peripheral vascular disease Sister   . Breast cancer Sister   . Hypertension Son   . COPD Neg Hx   . Asthma Neg Hx   . Colon cancer Neg Hx   . Esophageal cancer Neg Hx   . Rectal cancer Neg Hx   . Stomach cancer Neg Hx     Social History Social History   Tobacco Use  . Smoking status: Former Smoker    Years: 2.00    Types: Cigarettes    Quit date: 06/04/1973    Years since quitting: 45.7  . Smokeless tobacco: Never Used  . Tobacco comment: Smoked for 5 years 1965-1970 up to 1/2 pp week  Substance Use Topics  . Alcohol use: Not Currently  . Drug use: No     Allergies   Pravastatin sodium   Review of Systems Review of Systems  Unable to perform ROS: Mental status change     Physical Exam Updated Vital Signs BP (!) 168/107   Pulse 75   Temp 99 F (37.2 C)   Resp (!) 25   Ht 5' (1.524 m)   Wt 55.5 kg   SpO2 98%   BMI 23.91 kg/m    Physical Exam Vitals signs and nursing note reviewed.  Constitutional:      Appearance: She is well-developed. She is ill-appearing.     Comments: Confused and disoriented, unable to answer questions, responded to her name with 'yes' but that was it  HENT:     Head: Normocephalic and atraumatic.  Eyes:     Conjunctiva/sclera: Conjunctivae normal.  Neck:     Musculoskeletal: Neck supple.  Cardiovascular:     Rate and Rhythm: Normal rate and regular rhythm.  Pulmonary:     Effort: Pulmonary effort is normal. No respiratory distress.     Breath sounds: Normal breath sounds.  Abdominal:     Palpations: Abdomen is soft.     Tenderness: There is no abdominal tenderness.  Skin:    General: Skin is warm and dry.  Neurological:     Mental Status: She is alert.     Comments: Mental Status: Responded to name with a 'yes' but did not follow commands. Unable to answer orientation questions.  Right sided gaze preference with left sided neglect. No facial droop. Tongue and uvula midline.   Mild bilateral upper extremity rigidity noted. Unable to follow commands but moving RHB more than LHB.      ED Treatments / Results  Labs (all labs ordered are listed, but only abnormal results are displayed) Labs Reviewed  CBC - Abnormal; Notable for the following components:      Result Value   WBC 14.9 (*)    All other components within normal limits  DIFFERENTIAL - Abnormal; Notable for the following components:   Neutro Abs 12.9 (*)    All other components within normal limits  COMPREHENSIVE METABOLIC PANEL - Abnormal; Notable for the following components:   CO2 20 (*)    Glucose, Bld 204 (*)    BUN 32 (*)    Creatinine, Ser 1.22 (*)    Total Bilirubin 1.3 (*)    GFR calc non Af Amer 41 (*)    GFR calc Af Amer 47 (*)    All other components within normal limits  URINALYSIS, ROUTINE W REFLEX MICROSCOPIC - Abnormal; Notable for the following components:   Specific Gravity, Urine >1.046 (*)     Glucose, UA 50 (*)    Hgb urine dipstick SMALL (*)    Ketones, ur 20 (*)    Protein, ur 30 (*)    All other components within normal limits  COMPREHENSIVE METABOLIC PANEL - Abnormal; Notable for the following components:   CO2 20 (*)    Glucose, Bld 166 (*)    BUN 29 (*)    Creatinine, Ser 1.25 (*)    Calcium 8.8 (*)    Total Protein 6.0 (*)    Albumin 3.1 (*)    GFR calc non Af Amer 39 (*)    GFR calc Af Amer 46 (*)    All other components within normal limits  CBC - Abnormal; Notable for the following components:   WBC 12.8 (*)    RBC 3.78 (*)    Hemoglobin 11.7 (*)    HCT 35.9 (*)    All other components within normal limits  CBG MONITORING, ED - Abnormal; Notable for the following components:   Glucose-Capillary 194 (*)    All other components within normal limits  I-STAT CHEM 8, ED - Abnormal; Notable for the following components:   Potassium 5.4 (*)    BUN 38 (*)    Glucose, Bld 194 (*)    Calcium, Ion 1.11 (*)    All other components within normal limits  SARS CORONAVIRUS 2 (TAT 6-24 HRS)  ETHANOL  PROTIME-INR  APTT  RAPID URINE DRUG SCREEN, HOSP PERFORMED    EKG None  Radiology Ct Code Stroke Cta Head W/wo Contrast  Result Date: 03/05/2019 CLINICAL DATA:  85 year old female code stroke presentation with rightward gaze deviation, seizure like activity. EXAM: CT ANGIOGRAPHY HEAD AND NECK TECHNIQUE: Multidetector CT  imaging of the head and neck was performed using the standard protocol during bolus administration of intravenous contrast. Multiplanar CT image reconstructions and MIPs were obtained to evaluate the vascular anatomy. Carotid stenosis measurements (when applicable) are obtained utilizing NASCET criteria, using the distal internal carotid diameter as the denominator. CONTRAST:  100 milliliters Omnipaque 350. COMPARISON:  Plain head CT 2139 hours today. FINDINGS: Intermittent motion artifact. CTA NECK Skeleton: Absent dentition. No acute osseous  abnormality identified. Upper chest: Negative upper lungs. Subcentimeter mediastinal lymph nodes are within normal limits. Other neck: No acute findings in the neck. Aortic arch: 3 vessel arch configuration. Mild to moderate arch atherosclerosis, most affecting the subclavian artery origins. Right carotid system: No brachiocephalic or right CCA origin stenosis. Negative right carotid bifurcation aside from minor right ECA origin calcified plaque. Negative cervical right ICA. Left carotid system: Negative left CCA. Mild calcified plaque at the left ECA origin. Negative cervical left ICA. Vertebral arteries: Calcified plaque at the right subclavian artery origin with 50% stenosis. Normal right vertebral artery origin. Patent and normal right vertebral artery to the skull base. Calcified plaque at the left subclavian artery origin with less than 50 % stenosis with respect to the distal vessel. Normal left vertebral artery origin. The left vertebral is non dominant, patent to the skull base. CTA HEAD Posterior circulation: No distal vertebral artery stenosis. Mildly dominant right V4 segment. Patent right PICA and dominant appearing left AICA origins. Patent vertebrobasilar junction without stenosis. The basilar artery is patent but diminutive. Basilar detail is mildly obscured by motion. The basilar appears to terminates at the SCA origins which are patent. Fetal type bilateral PCA origins. Bilateral PCA branches are within normal limits. Anterior circulation: Both ICA siphons are patent. On the left there is bulky cavernous segment calcified plaque with possible high-grade stenosis in the anterior genu. The left ICA terminus is patent. Normal left posterior communicating artery. On the right there is similar bulky distal cavernous and anterior genu segment calcified plaque with possible high-grade stenosis but the right ICA terminus is patent. Normal right posterior communicating artery origin. Patent MCA and ACA  origins. Anterior communicating artery and ACA branches are within normal limits. Left M1, bifurcation and left MCA branches are within normal limits. There is a greater degree of motion at the right MCA branches but the M1, right MCA bifurcation, and right MCA branches appear to remain patent. Venous sinuses: Early contrast timing, grossly patent. Anatomic variants: Dominant right vertebral artery. Fetal type bilateral PCA origins resulting in diminutive basilar artery. Review of the MIP images confirms the above findings IMPRESSION: 1. Intermittently degraded by motion but negative for large vessel occlusion. This was discussed by telephone with Dr. Samara Snide on 03/05/2019 at 2213 hours. 2. Possible high-grade bilateral ICA siphon stenosis due to calcified plaque. Stenosis appears maximal at the bilateral ICA anterior genu. 3. No posterior circulation stenosis. Bilateral subclavian artery origin plaque with up to 50% right subclavian stenosis. Electronically Signed   By: Genevie Ann M.D.   On: 03/05/2019 22:32   Ct Code Stroke Cta Neck W/wo Contrast  Result Date: 03/05/2019 CLINICAL DATA:  85 year old female code stroke presentation with rightward gaze deviation, seizure like activity. EXAM: CT ANGIOGRAPHY HEAD AND NECK TECHNIQUE: Multidetector CT imaging of the head and neck was performed using the standard protocol during bolus administration of intravenous contrast. Multiplanar CT image reconstructions and MIPs were obtained to evaluate the vascular anatomy. Carotid stenosis measurements (when applicable) are obtained utilizing NASCET criteria, using  the distal internal carotid diameter as the denominator. CONTRAST:  100 milliliters Omnipaque 350. COMPARISON:  Plain head CT 2139 hours today. FINDINGS: Intermittent motion artifact. CTA NECK Skeleton: Absent dentition. No acute osseous abnormality identified. Upper chest: Negative upper lungs. Subcentimeter mediastinal lymph nodes are within normal limits.  Other neck: No acute findings in the neck. Aortic arch: 3 vessel arch configuration. Mild to moderate arch atherosclerosis, most affecting the subclavian artery origins. Right carotid system: No brachiocephalic or right CCA origin stenosis. Negative right carotid bifurcation aside from minor right ECA origin calcified plaque. Negative cervical right ICA. Left carotid system: Negative left CCA. Mild calcified plaque at the left ECA origin. Negative cervical left ICA. Vertebral arteries: Calcified plaque at the right subclavian artery origin with 50% stenosis. Normal right vertebral artery origin. Patent and normal right vertebral artery to the skull base. Calcified plaque at the left subclavian artery origin with less than 50 % stenosis with respect to the distal vessel. Normal left vertebral artery origin. The left vertebral is non dominant, patent to the skull base. CTA HEAD Posterior circulation: No distal vertebral artery stenosis. Mildly dominant right V4 segment. Patent right PICA and dominant appearing left AICA origins. Patent vertebrobasilar junction without stenosis. The basilar artery is patent but diminutive. Basilar detail is mildly obscured by motion. The basilar appears to terminates at the SCA origins which are patent. Fetal type bilateral PCA origins. Bilateral PCA branches are within normal limits. Anterior circulation: Both ICA siphons are patent. On the left there is bulky cavernous segment calcified plaque with possible high-grade stenosis in the anterior genu. The left ICA terminus is patent. Normal left posterior communicating artery. On the right there is similar bulky distal cavernous and anterior genu segment calcified plaque with possible high-grade stenosis but the right ICA terminus is patent. Normal right posterior communicating artery origin. Patent MCA and ACA origins. Anterior communicating artery and ACA branches are within normal limits. Left M1, bifurcation and left MCA branches  are within normal limits. There is a greater degree of motion at the right MCA branches but the M1, right MCA bifurcation, and right MCA branches appear to remain patent. Venous sinuses: Early contrast timing, grossly patent. Anatomic variants: Dominant right vertebral artery. Fetal type bilateral PCA origins resulting in diminutive basilar artery. Review of the MIP images confirms the above findings IMPRESSION: 1. Intermittently degraded by motion but negative for large vessel occlusion. This was discussed by telephone with Dr. Samara Snide on 03/05/2019 at 2213 hours. 2. Possible high-grade bilateral ICA siphon stenosis due to calcified plaque. Stenosis appears maximal at the bilateral ICA anterior genu. 3. No posterior circulation stenosis. Bilateral subclavian artery origin plaque with up to 50% right subclavian stenosis. Electronically Signed   By: Genevie Ann M.D.   On: 03/05/2019 22:32   Mr Brain Wo Contrast  Result Date: 03/06/2019 CLINICAL DATA:  Altered mental status with unclear cause EXAM: MRI HEAD WITHOUT CONTRAST TECHNIQUE: Multiplanar, multiecho pulse sequences of the brain and surrounding structures were obtained without intravenous contrast. COMPARISON:  CT and CT perfusion from yesterday FINDINGS: Brain: No acute infarction, hemorrhage, hydrocephalus, extra-axial collection or mass lesion. Advanced cerebral atrophy with generalized cortical thinning. Mild small vessel ischemic gliosis in the cerebral white matter. 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 Vascular: Normal flow voids Skull and upper cervical spine: Normal marrow signal Sinuses/Orbits: Negative IMPRESSION: 1. No acute finding. 2. Advanced cerebral atrophy. Electronically Signed   By: Monte Fantasia M.D.   On: 03/06/2019  04:25   Ct Cerebral Perfusion W Contrast  Result Date: 03/05/2019 CLINICAL DATA:  85 year old female code stroke presentation with rightward gaze deviation, seizure like activity. EXAM: CT PERFUSION  BRAIN TECHNIQUE: Multiphase CT imaging of the brain was performed following IV bolus contrast injection. Subsequent parametric perfusion maps were calculated using RAPID software. CONTRAST:  165mL OMNIPAQUE IOHEXOL 350 MG/ML SOLN COMPARISON:  Plain head CT today 2139 hours.  CTA head and neck. FINDINGS: CT Brain Perfusion Findings: ASPECTS on noncontrast CT Head: 19 (age indeterminate right lentiform lacune) at 2139 hours today. CBF (<30%) Volume: None Perfusion (Tmax>6.0s) volume: None Mismatch Volume: Not applicable Infarction Location:Not applicable IMPRESSION: 1. No infarct core or ischemic penumbra detected by CT Perfusion. 2. CTA head and neck reported separately. Electronically Signed   By: Genevie Ann M.D.   On: 03/05/2019 22:35   Ct Head Code Stroke Wo Contrast  Result Date: 03/05/2019 CLINICAL DATA:  Code stroke. 85 year old female last seen normal at 0 800 hours. Rightward gaze deviation. EXAM: CT HEAD WITHOUT CONTRAST TECHNIQUE: Contiguous axial images were obtained from the base of the skull through the vertex without intravenous contrast. COMPARISON:  None. FINDINGS: Brain: Small dystrophic basal ganglia calcifications. Hypodense lacunar infarct tracking from the right corona radiata into the right lentiform. No associated hemorrhage or mass effect. Superimposed confluent bilateral cerebral white matter hypodensity. No midline shift, ventriculomegaly, mass effect, evidence of mass lesion, intracranial hemorrhage or evidence of cortically based acute infarction. Vascular: Calcified atherosclerosis at the skull base. No suspicious intracranial vascular hyperdensity. Skull: Osteopenia.  No acute osseous abnormality identified. Sinuses/Orbits: Trace paranasal sinus mucosal thickening. Other: Mild rightward gaze deviation. No other acute orbit or scalp soft tissue findings. ASPECTS [facility] Stroke Program Early CT Score) - Ganglionic level infarction (caudate, lentiform nuclei, internal capsule, insula,  M1-M3 cortex): 6 - Supraganglionic infarction (M4-M6 cortex): 3 Total score (0-10 with 10 being normal): 9 IMPRESSION: 1. Age indeterminate lacunar infarct at the right corona radiata and lentiform. No associated hemorrhage or mass effect. ASPECTS 9. 2. Underlying bilateral white matter disease. No acute intracranial hemorrhage or acute cortically based infarct identified. These results were communicated to Dr. Lorraine Lax at 9:48 pm on 03/05/2019 by text page via the [facility] messaging system. Electronically Signed   By: Genevie Ann M.D.   On: 03/05/2019 21:49    Procedures Procedures (including critical care time)  Medications Ordered in ED Medications  sodium chloride 0.9 % bolus 500 mL (0 mLs Intravenous Stopped 03/06/19 0057)    Followed by  0.9 %  sodium chloride infusion (100 mL/hr Intravenous Not Given 03/06/19 0010)  haloperidol lactate (HALDOL) injection 2 mg (2 mg Intravenous Given 03/05/19 2150)  levETIRAcetam (KEPPRA) IVPB 1000 mg/100 mL premix (0 mg Intravenous Stopped 03/06/19 0049)  insulin lispro (HUMALOG) KwikPen 10 Units (has no administration in time range)  cholecalciferol (VITAMIN D) tablet 2,500 Units (has no administration in time range)  Insulin Glargine (LANTUS) Solostar Pen 30 Units (30 Units Subcutaneous Not Given 03/06/19 0008)  colchicine tablet 0.6 mg (has no administration in time range)  atorvastatin (LIPITOR) tablet 20 mg (has no administration in time range)  carvedilol (COREG) tablet 25 mg (25 mg Oral Not Given 03/06/19 0008)  ramipril (ALTACE) capsule 10 mg (has no administration in time range)  furosemide (LASIX) tablet 20 mg (has no administration in time range)  insulin aspart (novoLOG) injection 0-9 Units (has no administration in time range)  insulin aspart (novoLOG) injection 0-5 Units (0 Units Subcutaneous Not Given 03/06/19 0009)  enoxaparin (LOVENOX) injection 40 mg (40 mg Subcutaneous Not Given 03/06/19 0056)  0.9 %  sodium chloride infusion ( Intravenous New  Bag/Given 03/06/19 0057)  acetaminophen (TYLENOL) tablet 650 mg (has no administration in time range)    Or  acetaminophen (TYLENOL) suppository 650 mg (has no administration in time range)  ondansetron (ZOFRAN) tablet 4 mg (has no administration in time range)    Or  ondansetron (ZOFRAN) injection 4 mg (has no administration in time range)  levETIRAcetam (KEPPRA) IVPB 1000 mg/100 mL premix (1,000 mg Intravenous Not Given 03/06/19 0009)  iohexol (OMNIPAQUE) 350 MG/ML injection 100 mL (100 mLs Intravenous Contrast Given 03/05/19 2222)  LORazepam (ATIVAN) injection 2 mg (2 mg Intravenous Given 03/05/19 2235)     Initial Impression / Assessment and Plan / ED Course  I have reviewed the triage vital signs and the nursing notes.  Pertinent labs & imaging results that were available during my care of the patient were reviewed by me and considered in my medical decision making (see chart for details).        The patient presented to the ED with the above neurologic findings concerning for acute CVA vs ongoing partial seizure. ABC intact on arrival, GCS 13. A CODE STROKE was called on arrival. tPA not administered as the patient was outside the window.  Vitals T 19F, BP 174/63, P 97, RR 32 with O2 92% on RA. Additional workup to include labs significant for WBC 14.9, Hgb 14.2, Plts 221. Na 138, K 5, CO2 20, BUN 32, Cr 1.22, Ca 9.6. BG 204. EKG with apparent new onset atrial fibrillation.  CT imaging performed and significant for no acute intracranial abnormality. Concern by neurology following CT imaging for status epilepticus with partial seizures. The patient was administered IV Ativan and Keppra. Her mental status improved following medication administration. On re-assessment, she was somnolent but protecting her airway. EEG was underway. Hospitalist medicine was consulted for admission for possible status epilepticus vs rule-out CVA.     Final Clinical Impressions(s) / ED Diagnoses   Final  diagnoses:  Left-sided weakness  Aphasia  Altered mental status, unspecified altered mental status type    ED Discharge Orders    None       Regan Lemming, MD 03/06/19 [id number]    Gareth Morgan, MD 03/06/19 2311

## 2019-03-05 NOTE — ED Notes (Signed)
Family is here outside.

## 2019-03-05 NOTE — Progress Notes (Signed)
EEG complete - results pending 

## 2019-03-05 NOTE — H&P (Signed)
History and Physical   Taylor Castaneda VZC:[id number] DOB: November 07, 1932 DOA: 03/05/2019  Referring MD/NP/PA: Dr. Billy Fischer  PCP: [name], MD   Outpatient Specialists: None  Patient coming from: Altered mental status  Chief Complaint: Altered mental status  HPI: Taylor Castaneda is a 85 y.o. female with medical history significant of diabetes, hypertension, glaucoma, anemia of chronic disease, chronic kidney disease and hyperlipidemia who was brought in secondary to altered mental status.  Patient was apparently found on the floor by family several hours after she was last seen normal.  Daughter last saw her normal around 8:00 in the morning but a neighbor apparently has seen her around noon.  The son went to check on her this evening and found her on the floor and called EMS.  A code stroke was initially activated in the ER but all work-up has been negative.  No TPA has been given.  Patient was seen by neurology and at this point suspected status epilepticus.  Patient has received Ativan and currently has come out and is awake.  Able to communicate.  No observed neurologic deficits.  No known sick contacts..  ED Course: Temperature is 99, blood pressure 174/63, pulse 97 respiratory 32 oxygen sat 92% on room air.  White count is 14.9, hemoglobin 14.2 and platelets 221.  Sodium is 138 potassium 5.0 chloride 103 CO2 20 BUN 32 creatinine 1.22 and calcium 9.6.  Glucose 204.  Head CT without contrast showed no acute findings.  CT angiogram showed intermittently degraded by motion but possible high-grade bilateral ICA stenosis due to plaques.  Patient will be admitted for both stroke rule out as well as treatment for status epilepticus  Review of Systems: As per HPI otherwise 10 point review of systems negative.    Past Medical History:  Diagnosis Date   Anemia    Diabetes mellitus, type II ([place])    Glaucoma    Dr.Hecker   History of colon cancer 1998   Dr. Lennie Hummer   Hyperlipemia      Osteopenia 02/2012, 03/2014   DEXA hip -2.2   Renal insufficiency    Stenosing tenosynovitis of finger of left hand 2016   index - s/p steroid injection x2    Past Surgical History:  Procedure Laterality Date   ABI  11/2013   WNL   APPENDECTOMY     CARPAL TUNNEL RELEASE     Bilateral    CATARACT EXTRACTION Bilateral 2006   Implants   COLECTOMY  1998   For cancer   COLONOSCOPY  2008   Dr.Stark, Due 2011   COLONOSCOPY  06/2013   1 hyperplastic polyp, ileocecal anastomosis [facility])   dexa  02/2012, 03/2014   T score -2.2 at hip overall stable   REFRACTIVE SURGERY     TONSILLECTOMY AND ADENOIDECTOMY     TOTAL KNEE ARTHROPLASTY Left 12/17/2016   Procedure: LEFT TOTAL KNEE ARTHROPLASTY;  Surgeon: Gaynelle Arabian, MD;  Location: [facility];  Service: Orthopedics;  Laterality: Left;     reports that she quit smoking about 45 years ago. Her smoking use included cigarettes. She quit after 2.00 years of use. She has never used smokeless tobacco. She reports previous alcohol use. She reports that she does not use drugs.  Allergies  Allergen Reactions   Pravastatin Sodium     REACTION: anemia as per Dr Gilford Rile, [facility]    Family History  Problem Relation Age of Onset   Heart attack Father 43   Stroke Father 45  Diabetes Father    Hypertension Father    Heart disease Father        before age 41   Breast cancer Mother    Cancer Mother    Diabetes Sister    Cancer Sister    Peripheral vascular disease Sister    Breast cancer Sister    Hypertension Son    COPD Neg Hx    Asthma Neg Hx    Colon cancer Neg Hx    Esophageal cancer Neg Hx    Rectal cancer Neg Hx    Stomach cancer Neg Hx      Prior to Admission medications   Medication Sig Start Date End Date Taking? Authorizing Provider  atorvastatin (LIPITOR) 20 MG tablet Take 1 tablet (20 mg total) by mouth daily at 6 PM. 03/03/19   [name], MD  B-D ULTRAFINE III SHORT PEN 31G X 8 MM  MISC USE AS DIRECTED WITH INSULIN 07/09/11   Hendricks Limes, MD  Carboxymethylcellul-Glycerin (CLEAR EYES FOR DRY EYES) 1-0.25 % SOLN Place 1-2 drops into both eyes 3 (three) times daily as needed (for dry/irritated eyes.).    [provider]  carvedilol (COREG) 25 MG tablet Take 1 tablet (25 mg total) by mouth 2 (two) times daily. 03/03/19   [name], MD  Cholecalciferol (VITAMIN D3 PO) Take 2,500 Units by mouth daily.    [provider]  colchicine 0.6 MG tablet Take 1 tablet (0.6 mg total) by mouth daily as needed (gout flare). First day of gout flare, may take 1 tablet twice daily. 11/25/18   [name], MD  furosemide (LASIX) 40 MG tablet Take 0.5 tablets (20 mg total) by mouth daily. 03/03/19   [name], MD  glucose blood (FREESTYLE LITE) test strip Use as instructed Patient taking differently: 1 each by Other route 4 (four) times daily. Use as instructed 02/09/11   Hendricks Limes, MD  HUMALOG KWIKPEN 100 UNIT/ML KiwkPen Inject 10 Units into the skin 3 (three) times daily before meals. 10/04/16   [provider]  LANTUS SOLOSTAR 100 UNIT/ML Solostar Pen Inject 30 Units into the skin at bedtime. 02/14/17   [name], MD  potassium chloride (K-DUR) 10 MEQ tablet TAKE 1  BY MOUTH ON Adelfa Koh, [facility] AND FRIDAY 03/03/19   [name], MD  ramipril (ALTACE) 10 MG capsule Take 1 capsule (10 mg total) by mouth daily. 03/03/19   [name], MD    Physical Exam: Vitals:   03/05/19 2215 03/05/19 2230 03/05/19 2242 03/05/19 2300  BP: (!) 162/93 (!) 160/97  (!) 174/63  Pulse: 97 87  94  Resp: (!) 32 (!) 27  17  Temp:      SpO2: 92% 94%  96%  Weight:   55.5 kg   Height:   5' (1.524 m)       Constitutional: NAD, confused but awake Vitals:   03/05/19 2215 03/05/19 2230 03/05/19 2242 03/05/19 2300  BP: (!) 162/93 (!) 160/97  (!) 174/63  Pulse: 97 87  94  Resp: (!) 32 (!) 27  17  Temp:      SpO2: 92% 94%  96%  Weight:   55.5  kg   Height:   5' (1.524 m)    Eyes: PERRL, lids and conjunctivae normal ENMT: Mucous membranes are moist. Posterior pharynx clear of any exudate or lesions.Normal dentition.  Neck: normal, supple, no masses, no thyromegaly Respiratory: clear to auscultation bilaterally, no wheezing, no crackles. Normal respiratory effort. No accessory  muscle use.  Cardiovascular: Irregularly irregular with controlled rate, no murmurs / rubs / gallops. No extremity edema. 2+ pedal pulses. No carotid bruits.  Abdomen: no tenderness, no masses palpated. No hepatosplenomegaly. Bowel sounds positive.  Musculoskeletal: no clubbing / cyanosis. No joint deformity upper and lower extremities. Good ROM, no contractures. Normal muscle tone.  Skin: no rashes, lesions, ulcers. No induration Neurologic: CN 2-12 grossly intact. Sensation intact, DTR normal. Strength 5/5 in all 4.  No focal neurologic deficit Psychiatric: Normal judgment and insight. Alert and oriented x 3. Normal mood.     Labs on Admission: I have personally reviewed following labs and imaging studies  CBC: Recent Labs  Lab 03/05/19 2132 03/05/19 2150  WBC  --  14.9*  NEUTROABS  --  12.9*  HGB 13.3 14.2  HCT 39.0 40.9  MCV  --  93.2  PLT  --  283   Basic Metabolic Panel: Recent Labs  Lab 03/05/19 2132 03/05/19 2150  NA 138 138  K 5.4* 5.0  CL 107 103  CO2  --  20*  GLUCOSE 194* 204*  BUN 38* 32*  CREATININE 1.00 1.22*  CALCIUM  --  9.6   GFR: Estimated Creatinine Clearance: 26.8 mL/min (A) (by C-G formula based on SCr of 1.22 mg/dL (H)). Liver Function Tests: Recent Labs  Lab 03/05/19 2150  AST 21  ALT 19  ALKPHOS 111  BILITOT 1.3*  PROT 6.9  ALBUMIN 3.8   No results for input(s): LIPASE, AMYLASE in the last 168 hours. No results for input(s): AMMONIA in the last 168 hours. Coagulation Profile: Recent Labs  Lab 03/05/19 2150  INR 1.1   Cardiac Enzymes: No results for input(s): CKTOTAL, CKMB, CKMBINDEX, TROPONINI  in the last 168 hours. BNP (last 3 results) No results for input(s): PROBNP in the last 8760 hours. HbA1C: No results for input(s): HGBA1C in the last 72 hours. CBG: Recent Labs  Lab 03/05/19 2125  GLUCAP 194*   Lipid Profile: No results for input(s): CHOL, HDL, LDLCALC, TRIG, CHOLHDL, LDLDIRECT in the last 72 hours. Thyroid Function Tests: No results for input(s): TSH, T4TOTAL, FREET4, T3FREE, THYROIDAB in the last 72 hours. Anemia Panel: No results for input(s): VITAMINB12, FOLATE, FERRITIN, TIBC, IRON, RETICCTPCT in the last 72 hours. Urine analysis:    Component Value Date/Time   COLORURINE YELLOW 03/08/2011 2011   APPEARANCEUR HAZY (A) 03/08/2011 2011   LABSPEC 1.016 03/08/2011 2011   PHURINE 5.0 03/08/2011 2011   GLUCOSEU NEGATIVE 03/08/2011 2011   HGBUR NEGATIVE 03/08/2011 2011   HGBUR large 06/08/2009 [place] 03/08/2011 2011   [place] 03/08/2011 2011   PROTEINUR NEGATIVE 03/08/2011 2011   UROBILINOGEN 0.2 03/08/2011 2011   NITRITE NEGATIVE 03/08/2011 2011   LEUKOCYTESUR LARGE (A) 03/08/2011 2011   Sepsis Labs: @LABRCNTIP (procalcitonin:4,lacticidven:4) )No results found for this or any previous visit (from the past 240 hour(s)).   Radiological Exams on Admission: Ct Code Stroke Cta Head W/wo Contrast  Result Date: 03/05/2019 CLINICAL DATA:  85 year old female code stroke presentation with rightward gaze deviation, seizure like activity. EXAM: CT ANGIOGRAPHY HEAD AND NECK TECHNIQUE: Multidetector CT imaging of the head and neck was performed using the standard protocol during bolus administration of intravenous contrast. Multiplanar CT image reconstructions and MIPs were obtained to evaluate the vascular anatomy. Carotid stenosis measurements (when applicable) are obtained utilizing NASCET criteria, using the distal internal carotid diameter as the denominator. CONTRAST:  100 milliliters Omnipaque 350. COMPARISON:  Plain head CT 2139 hours  today. FINDINGS: Intermittent motion artifact. CTA NECK Skeleton: Absent dentition. No acute osseous abnormality identified. Upper chest: Negative upper lungs. Subcentimeter mediastinal lymph nodes are within normal limits. Other neck: No acute findings in the neck. Aortic arch: 3 vessel arch configuration. Mild to moderate arch atherosclerosis, most affecting the subclavian artery origins. Right carotid system: No brachiocephalic or right CCA origin stenosis. Negative right carotid bifurcation aside from minor right ECA origin calcified plaque. Negative cervical right ICA. Left carotid system: Negative left CCA. Mild calcified plaque at the left ECA origin. Negative cervical left ICA. Vertebral arteries: Calcified plaque at the right subclavian artery origin with 50% stenosis. Normal right vertebral artery origin. Patent and normal right vertebral artery to the skull base. Calcified plaque at the left subclavian artery origin with less than 50 % stenosis with respect to the distal vessel. Normal left vertebral artery origin. The left vertebral is non dominant, patent to the skull base. CTA HEAD Posterior circulation: No distal vertebral artery stenosis. Mildly dominant right V4 segment. Patent right PICA and dominant appearing left AICA origins. Patent vertebrobasilar junction without stenosis. The basilar artery is patent but diminutive. Basilar detail is mildly obscured by motion. The basilar appears to terminates at the SCA origins which are patent. Fetal type bilateral PCA origins. Bilateral PCA branches are within normal limits. Anterior circulation: Both ICA siphons are patent. On the left there is bulky cavernous segment calcified plaque with possible high-grade stenosis in the anterior genu. The left ICA terminus is patent. Normal left posterior communicating artery. On the right there is similar bulky distal cavernous and anterior genu segment calcified plaque with possible high-grade stenosis but the  right ICA terminus is patent. Normal right posterior communicating artery origin. Patent MCA and ACA origins. Anterior communicating artery and ACA branches are within normal limits. Left M1, bifurcation and left MCA branches are within normal limits. There is a greater degree of motion at the right MCA branches but the M1, right MCA bifurcation, and right MCA branches appear to remain patent. Venous sinuses: Early contrast timing, grossly patent. Anatomic variants: Dominant right vertebral artery. Fetal type bilateral PCA origins resulting in diminutive basilar artery. Review of the MIP images confirms the above findings IMPRESSION: 1. Intermittently degraded by motion but negative for large vessel occlusion. This was discussed by telephone with Dr. Samara Snide on 03/05/2019 at 2213 hours. 2. Possible high-grade bilateral ICA siphon stenosis due to calcified plaque. Stenosis appears maximal at the bilateral ICA anterior genu. 3. No posterior circulation stenosis. Bilateral subclavian artery origin plaque with up to 50% right subclavian stenosis. Electronically Signed   By: Genevie Ann M.D.   On: 03/05/2019 22:32   Ct Code Stroke Cta Neck W/wo Contrast  Result Date: 03/05/2019 CLINICAL DATA:  85 year old female code stroke presentation with rightward gaze deviation, seizure like activity. EXAM: CT ANGIOGRAPHY HEAD AND NECK TECHNIQUE: Multidetector CT imaging of the head and neck was performed using the standard protocol during bolus administration of intravenous contrast. Multiplanar CT image reconstructions and MIPs were obtained to evaluate the vascular anatomy. Carotid stenosis measurements (when applicable) are obtained utilizing NASCET criteria, using the distal internal carotid diameter as the denominator. CONTRAST:  100 milliliters Omnipaque 350. COMPARISON:  Plain head CT 2139 hours today. FINDINGS: Intermittent motion artifact. CTA NECK Skeleton: Absent dentition. No acute osseous abnormality identified.  Upper chest: Negative upper lungs. Subcentimeter mediastinal lymph nodes are within normal limits. Other neck: No acute findings in the neck. Aortic arch: 3 vessel arch configuration.  Mild to moderate arch atherosclerosis, most affecting the subclavian artery origins. Right carotid system: No brachiocephalic or right CCA origin stenosis. Negative right carotid bifurcation aside from minor right ECA origin calcified plaque. Negative cervical right ICA. Left carotid system: Negative left CCA. Mild calcified plaque at the left ECA origin. Negative cervical left ICA. Vertebral arteries: Calcified plaque at the right subclavian artery origin with 50% stenosis. Normal right vertebral artery origin. Patent and normal right vertebral artery to the skull base. Calcified plaque at the left subclavian artery origin with less than 50 % stenosis with respect to the distal vessel. Normal left vertebral artery origin. The left vertebral is non dominant, patent to the skull base. CTA HEAD Posterior circulation: No distal vertebral artery stenosis. Mildly dominant right V4 segment. Patent right PICA and dominant appearing left AICA origins. Patent vertebrobasilar junction without stenosis. The basilar artery is patent but diminutive. Basilar detail is mildly obscured by motion. The basilar appears to terminates at the SCA origins which are patent. Fetal type bilateral PCA origins. Bilateral PCA branches are within normal limits. Anterior circulation: Both ICA siphons are patent. On the left there is bulky cavernous segment calcified plaque with possible high-grade stenosis in the anterior genu. The left ICA terminus is patent. Normal left posterior communicating artery. On the right there is similar bulky distal cavernous and anterior genu segment calcified plaque with possible high-grade stenosis but the right ICA terminus is patent. Normal right posterior communicating artery origin. Patent MCA and ACA origins. Anterior  communicating artery and ACA branches are within normal limits. Left M1, bifurcation and left MCA branches are within normal limits. There is a greater degree of motion at the right MCA branches but the M1, right MCA bifurcation, and right MCA branches appear to remain patent. Venous sinuses: Early contrast timing, grossly patent. Anatomic variants: Dominant right vertebral artery. Fetal type bilateral PCA origins resulting in diminutive basilar artery. Review of the MIP images confirms the above findings IMPRESSION: 1. Intermittently degraded by motion but negative for large vessel occlusion. This was discussed by telephone with Dr. Samara Snide on 03/05/2019 at 2213 hours. 2. Possible high-grade bilateral ICA siphon stenosis due to calcified plaque. Stenosis appears maximal at the bilateral ICA anterior genu. 3. No posterior circulation stenosis. Bilateral subclavian artery origin plaque with up to 50% right subclavian stenosis. Electronically Signed   By: Genevie Ann M.D.   On: 03/05/2019 22:32   Ct Cerebral Perfusion W Contrast  Result Date: 03/05/2019 CLINICAL DATA:  85 year old female code stroke presentation with rightward gaze deviation, seizure like activity. EXAM: CT PERFUSION BRAIN TECHNIQUE: Multiphase CT imaging of the brain was performed following IV bolus contrast injection. Subsequent parametric perfusion maps were calculated using RAPID software. CONTRAST:  178mL OMNIPAQUE IOHEXOL 350 MG/ML SOLN COMPARISON:  Plain head CT today 2139 hours.  CTA head and neck. FINDINGS: CT Brain Perfusion Findings: ASPECTS on noncontrast CT Head: 56 (age indeterminate right lentiform lacune) at 2139 hours today. CBF (<30%) Volume: None Perfusion (Tmax>6.0s) volume: None Mismatch Volume: Not applicable Infarction Location:Not applicable IMPRESSION: 1. No infarct core or ischemic penumbra detected by CT Perfusion. 2. CTA head and neck reported separately. Electronically Signed   By: Genevie Ann M.D.   On: 03/05/2019 22:35     Ct Head Code Stroke Wo Contrast  Result Date: 03/05/2019 CLINICAL DATA:  Code stroke. 85 year old female last seen normal at 0 800 hours. Rightward gaze deviation. EXAM: CT HEAD WITHOUT CONTRAST TECHNIQUE: Contiguous axial images were obtained from  the base of the skull through the vertex without intravenous contrast. COMPARISON:  None. FINDINGS: Brain: Small dystrophic basal ganglia calcifications. Hypodense lacunar infarct tracking from the right corona radiata into the right lentiform. No associated hemorrhage or mass effect. Superimposed confluent bilateral cerebral white matter hypodensity. No midline shift, ventriculomegaly, mass effect, evidence of mass lesion, intracranial hemorrhage or evidence of cortically based acute infarction. Vascular: Calcified atherosclerosis at the skull base. No suspicious intracranial vascular hyperdensity. Skull: Osteopenia.  No acute osseous abnormality identified. Sinuses/Orbits: Trace paranasal sinus mucosal thickening. Other: Mild rightward gaze deviation. No other acute orbit or scalp soft tissue findings. ASPECTS [facility] Stroke Program Early CT Score) - Ganglionic level infarction (caudate, lentiform nuclei, internal capsule, insula, M1-M3 cortex): 6 - Supraganglionic infarction (M4-M6 cortex): 3 Total score (0-10 with 10 being normal): 9 IMPRESSION: 1. Age indeterminate lacunar infarct at the right corona radiata and lentiform. No associated hemorrhage or mass effect. ASPECTS 9. 2. Underlying bilateral white matter disease. No acute intracranial hemorrhage or acute cortically based infarct identified. These results were communicated to Dr. Lorraine Lax at 9:48 pm on 03/05/2019 by text page via the [facility] messaging system. Electronically Signed   By: Genevie Ann M.D.   On: 03/05/2019 21:49    EKG: Independently reviewed.  It shows atrial fibrillation with a rate of 94.  No significant ST changes  Assessment/Plan Principal Problem:   Status epilepticus (HCC) Active  Problems:   Controlled type 2 diabetes mellitus with diabetic nephropathy, with long-term current use of insulin (HCC)   HLD (hyperlipidemia)   Essential hypertension   CKD stage 3 due to type 2 diabetes mellitus (HCC)   OA (osteoarthritis) of knee     #1 possible status epilepticus: Patient has responded to Ativan and is currently on Keppra.  Recommendations from neurology is to have 500 mg twice daily.  We will transition to that.  We will also get MRI of the brain as well as follow other neurology recommendations.  EEG will be done  #2 possible CVA: Again no evidence of stroke on CT but we will obtain MRI of the brain.  Continue work-up per neurology.  #3 diabetes: Sliding scale will be initiated.  Continue treatment  #4 essential hypertension: Continue home regimen.  Blood pressure at this point is controlled  #5 hyperlipidemia: Patient will need to be on statin.  #6 chronic kidney disease stage III: Stable at baseline  #7 atrial fibrillation: Seen on EKG.  Not previously documented.  Patient will definitely need some echocardiogram.   DVT prophylaxis: Lovenox Code Status: Full code Family Communication: Son Disposition Plan: To be determined Consults called: Dr. Lorraine Lax, neurology Admission status: Inpatient  Severity of Illness: The appropriate patient status for this patient is INPATIENT. Inpatient status is judged to be reasonable and necessary in order to provide the required intensity of service to ensure the patient's safety. The patient's presenting symptoms, physical exam findings, and initial radiographic and laboratory data in the context of their chronic comorbidities is felt to place them at high risk for further clinical deterioration. Furthermore, it is not anticipated that the patient will be medically stable for discharge from the hospital within 2 midnights of admission. The following factors support the patient status of inpatient.   " The patient's presenting  symptoms include confusion. " The worrisome physical exam findings include no observed neurologic deficit. " The initial radiographic and laboratory data are worrisome because of so far negative for his stroke. " The chronic co-morbidities include diabetes hypertension  hyperlipidemia.   * I certify that at the point of admission it is my clinical judgment that the patient will require inpatient hospital care spanning beyond 2 midnights from the point of admission due to high intensity of service, high risk for further deterioration and high frequency of surveillance required.Barbette Merino MD [facility] Pager [phone]  If 7PM-7AM, please contact night-coverage [email or web address] Password [email or web address]  03/05/2019, 11:12 PM

## 2019-03-05 NOTE — ED Triage Notes (Signed)
Patient was last seen normal at 08:00 this a.m. Per EMS, deviated gaze to the right and unequal pupils. 22 ga in left hand.

## 2019-03-05 NOTE — Consult Note (Addendum)
Requesting Physician: Dr. Shellee Milo    Chief Complaint: Altered mental status, right gaze deviation   History obtained from: Patient's daughter and Chart      HPI:                                                                                                                                       Taylor Castaneda is a 85 y.o. female with past medical history significant diabetes mellitus, colon cancer, hyperlipidemia who was found on the floor by her family confused.  Last known normal was 8 AM when she she spoke with her daughter, although her daughter provides information that she was seen by her neighbor outside the house around noon.  The patient's son went to check up on her and found her on the floor and called his sister and eventually EMS was called.  Patient arrived to [facility], ER and code stroke was activated.  NIH stroke scale was 19 with  patient not following commands, aphasia and neglect.  Her glucose was 194.  Blood pressure was 950 systolic.  Hello and check yes does have swelling EMR okay and would like to continue it at an MRI she received a stat CT head which ruled out parenchymal hemorrhage or subdural hemorrhage.  Patient did not receive TPA as she was outside the window.  CT angiogram was performed due to clinical suspicion for LVO however negative for any large vessel occlusion.  Patient required Haldol 2 mg and Ativan 2 mg due to agitation.  CT perfusion was also performed and showed no penumbra.  Due to patient still being confused with gaze deviation and left-sided neglect, weakness partial status versus seizure with postictal state was suspected. While in the CT scanner, patient had generalized tremors, appeared rhythmic.  Patient would forcibly close her eyes during this.  1 g of Keppra was ordered and and request for stat EEG was made.    Past Medical History:  Diagnosis Date  . Anemia   . Diabetes mellitus, type II ([place])   . Glaucoma    Dr.Hecker  . History of  colon cancer 1998   Dr. Lennie Hummer  . Hyperlipemia   . Osteopenia 02/2012, 03/2014   DEXA hip -2.2  . Renal insufficiency   . Stenosing tenosynovitis of finger of left hand 2016   index - s/p steroid injection x2    Past Surgical History:  Procedure Laterality Date  . ABI  11/2013   WNL  . APPENDECTOMY    . CARPAL TUNNEL RELEASE     Bilateral   . CATARACT EXTRACTION Bilateral 2006   Implants  . COLECTOMY  1998   For cancer  . COLONOSCOPY  2008   Dr.Stark, Due 2011  . COLONOSCOPY  06/2013   1 hyperplastic polyp, ileocecal anastomosis [facility])  . dexa  02/2012, 03/2014   T score -2.2 at hip overall stable  .  REFRACTIVE SURGERY    . TONSILLECTOMY AND ADENOIDECTOMY    . TOTAL KNEE ARTHROPLASTY Left 12/17/2016   Procedure: LEFT TOTAL KNEE ARTHROPLASTY;  Surgeon: Gaynelle Arabian, MD;  Location: [facility];  Service: Orthopedics;  Laterality: Left;    Family History  Problem Relation Age of Onset  . Heart attack Father 9  . Stroke Father 61  . Diabetes Father   . Hypertension Father   . Heart disease Father        before age 58  . Breast cancer Mother   . Cancer Mother   . Diabetes Sister   . Cancer Sister   . Peripheral vascular disease Sister   . Breast cancer Sister   . Hypertension Son   . COPD Neg Hx   . Asthma Neg Hx   . Colon cancer Neg Hx   . Esophageal cancer Neg Hx   . Rectal cancer Neg Hx   . Stomach cancer Neg Hx    Social History:  reports that she quit smoking about 45 years ago. Her smoking use included cigarettes. She quit after 2.00 years of use. She has never used smokeless tobacco. She reports previous alcohol use. She reports that she does not use drugs.  Allergies:  Allergies  Allergen Reactions  . Pravastatin Sodium     REACTION: anemia as per Dr Gilford Rile, [place]    Medications:                                                                                                                        I reviewed home medications   ROS:                                                                                                                                      Unable to obtain due to patient's mental status   Examination:  General: Appears well-developed . Psych: Affect appropriate to situation Eyes: No scleral injection HENT: No OP obstrucion Head: Normocephalic.  Cardiovascular: Irregular rate and rhythm Respiratory: Effort normal and breath sounds normal to anterior ascultation GI: Soft.  No distension. There is no tenderness.  Skin: WDI    Neurological Examination Mental Status: Alert, but not oriented. Answers "yes, no" but does not answer ay questions such her name. Does not name objects.  Cranial Nerves: II: Visual fields: reduced blink to threat on the left side  III,IV, VI: ptosis not present, extra-ocular motions intact bilaterally, pupils equal, round, reactive to light and accommodation VII: left facial droop  VIII: hearing normal bilaterally IX,X: uvula rises symmetrically XI: bilateral shoulder shrug XII: midline tongue extension Motor: Right : Upper extremity   5/5    Left:     Upper extremity   4/5  Lower extremity   3/5     Lower extremity   4/5 Tone and bulk:normal tone throughout; no atrophy noted Sensory: Pinprick and light touch intact throughout, bilaterally Plantars: Right: downgoing   Left: downgoing Cerebellar: Unable to assess due to mental status      Lab Results: Basic Metabolic Panel: Recent Labs  Lab 03/05/19 2132  NA 138  K 5.4*  CL 107  GLUCOSE 194*  BUN 38*  CREATININE 1.00    CBC: Recent Labs  Lab 03/05/19 2132  HGB 13.3  HCT 39.0    Coagulation Studies: No results for input(s): LABPROT, INR in the last 72 hours.  Imaging: No results found.   ASSESSMENT AND PLAN  85 y.o. female with past medical history significant diabetes mellitus, colon cancer,  hyperlipidemia who was found on the floor by her family altered and noted have gaze deviation and left side weakness.  CTA negative for LVO, CTP negative for core or perfusion deficit. Significant atrophy on CT head. Seizure with post ictal state/Todd's paresis vs partial status epilepticus likely compared to stroke.   Suspected partial seizures with postictal state/focal status epilepticus versus acute ischemic stroke due to distal vessel occlusion New onset atrial fibrillation   Recommendations Stat EEG obtaining: Shows generalized slowing, no epileptiform discharges was seen. Negative for non convulsive status epilepticus. Formal read pending Continue Keppra 500 mg twice daily MRI brain without contrast, and if positive for stroke recommend echocardiogram, aspirin, A1c and lipid profile, PT OT evaluation Seizure precautions and frequent neurochecks Check for urinary tract infection, CBC, CMP, urine drug screen: Pending     [facility] Pager Number [phone]

## 2019-03-06 ENCOUNTER — Inpatient Hospital Stay (HOSPITAL_COMMUNITY): Payer: Medicare Other

## 2019-03-06 DIAGNOSIS — E785 Hyperlipidemia, unspecified: Secondary | ICD-10-CM

## 2019-03-06 DIAGNOSIS — G40901 Epilepsy, unspecified, not intractable, with status epilepticus: Principal | ICD-10-CM

## 2019-03-06 DIAGNOSIS — E1121 Type 2 diabetes mellitus with diabetic nephropathy: Secondary | ICD-10-CM

## 2019-03-06 DIAGNOSIS — N183 Chronic kidney disease, stage 3 unspecified: Secondary | ICD-10-CM

## 2019-03-06 DIAGNOSIS — I4891 Unspecified atrial fibrillation: Secondary | ICD-10-CM

## 2019-03-06 DIAGNOSIS — Z794 Long term (current) use of insulin: Secondary | ICD-10-CM

## 2019-03-06 DIAGNOSIS — I10 Essential (primary) hypertension: Secondary | ICD-10-CM

## 2019-03-06 DIAGNOSIS — R4182 Altered mental status, unspecified: Secondary | ICD-10-CM

## 2019-03-06 LAB — COMPREHENSIVE METABOLIC PANEL
ALT: 17 U/L (ref 0–44)
AST: 19 U/L (ref 15–41)
Albumin: 3.1 g/dL — ABNORMAL LOW (ref 3.5–5.0)
Alkaline Phosphatase: 91 U/L (ref 38–126)
Anion gap: 13 (ref 5–15)
BUN: 29 mg/dL — ABNORMAL HIGH (ref 8–23)
CO2: 20 mmol/L — ABNORMAL LOW (ref 22–32)
Calcium: 8.8 mg/dL — ABNORMAL LOW (ref 8.9–10.3)
Chloride: 105 mmol/L (ref 98–111)
Creatinine, Ser: 1.25 mg/dL — ABNORMAL HIGH (ref 0.44–1.00)
GFR calc Af Amer: 46 mL/min — ABNORMAL LOW (ref >60)
GFR calc non Af Amer: 39 mL/min — ABNORMAL LOW (ref >60)
Glucose, Bld: 166 mg/dL — ABNORMAL HIGH (ref 70–99)
Potassium: 4.3 mmol/L (ref 3.5–5.1)
Sodium: 138 mmol/L (ref 135–145)
Total Bilirubin: 0.8 mg/dL (ref 0.3–1.2)
Total Protein: 6 g/dL — ABNORMAL LOW (ref 6.5–8.1)

## 2019-03-06 LAB — URINALYSIS, ROUTINE W REFLEX MICROSCOPIC
Bacteria, UA: NONE SEEN
Bilirubin Urine: NEGATIVE
Glucose, UA: 50 mg/dL — AB
Ketones, ur: 20 mg/dL — AB
Leukocytes,Ua: NEGATIVE
Nitrite: NEGATIVE
Protein, ur: 30 mg/dL — AB
Specific Gravity, Urine: >1.046 — ABNORMAL HIGH (ref 1.005–1.030)
pH: 5 (ref 5.0–8.0)

## 2019-03-06 LAB — CBG MONITORING, ED
Glucose-Capillary: 142 mg/dL — ABNORMAL HIGH (ref 70–99)
Glucose-Capillary: 115 mg/dL — ABNORMAL HIGH (ref 70–99)
Glucose-Capillary: 120 mg/dL — ABNORMAL HIGH (ref 70–99)

## 2019-03-06 LAB — GLUCOSE, CAPILLARY
Glucose-Capillary: 180 mg/dL — ABNORMAL HIGH (ref 70–99)
Glucose-Capillary: 79 mg/dL (ref 70–99)

## 2019-03-06 LAB — CBC
HCT: 35.9 % — ABNORMAL LOW (ref 36.0–46.0)
Hemoglobin: 11.7 g/dL — ABNORMAL LOW (ref 12.0–15.0)
MCH: 31 pg (ref 26.0–34.0)
MCHC: 32.6 g/dL (ref 30.0–36.0)
MCV: 95 fL (ref 80.0–100.0)
Platelets: 178 10*3/uL (ref 150–400)
RBC: 3.78 MIL/uL — ABNORMAL LOW (ref 3.87–5.11)
RDW: 13.5 % (ref 11.5–15.5)
WBC: 12.8 10*3/uL — ABNORMAL HIGH (ref 4.0–10.5)
nRBC: 0 % (ref 0.0–0.2)

## 2019-03-06 LAB — RAPID URINE DRUG SCREEN, HOSP PERFORMED
Amphetamines: NOT DETECTED
Barbiturates: NOT DETECTED
Benzodiazepines: NOT DETECTED
Cocaine: NOT DETECTED
Opiates: NOT DETECTED
Tetrahydrocannabinol: NOT DETECTED

## 2019-03-06 LAB — ECHOCARDIOGRAM COMPLETE
Height: 60 in
Weight: 1958.99 oz

## 2019-03-06 LAB — SARS CORONAVIRUS 2 (TAT 6-24 HRS): SARS Coronavirus 2: NEGATIVE

## 2019-03-06 MED ORDER — INSULIN ASPART 100 UNIT/ML ~~LOC~~ SOLN
10.0000 [IU] | Freq: Three times a day (TID) | SUBCUTANEOUS | Status: DC
  Administered 2019-03-06 – 2019-03-07 (×2): 10 [IU] via SUBCUTANEOUS

## 2019-03-06 MED ORDER — HYDRALAZINE HCL 10 MG PO TABS: 10.0000 mg | ORAL_TABLET | Freq: Four times a day (QID) | ORAL | Status: DC | PRN

## 2019-03-06 MED ORDER — ENOXAPARIN SODIUM 30 MG/0.3ML ~~LOC~~ SOLN
30.0000 mg | SUBCUTANEOUS | Status: DC
  Administered 2019-03-06 – 2019-03-07 (×2): 30 mg via SUBCUTANEOUS
  Filled 2019-03-06 (×2): qty 0.3

## 2019-03-06 MED ORDER — LEVETIRACETAM 250 MG PO TABS
250.0000 mg | ORAL_TABLET | Freq: Two times a day (BID) | ORAL | Status: DC
  Administered 2019-03-06 – 2019-03-07 (×2): 250 mg via ORAL
  Filled 2019-03-06 (×2): qty 1

## 2019-03-06 MED ORDER — LEVETIRACETAM IN NACL 500 MG/100ML IV SOLN
500.0000 mg | Freq: Two times a day (BID) | INTRAVENOUS | Status: DC
  Administered 2019-03-06: 500 mg via INTRAVENOUS
  Filled 2019-03-06 (×2): qty 100

## 2019-03-06 NOTE — ED Notes (Signed)
SDU  Breakfast ordered  

## 2019-03-06 NOTE — Progress Notes (Signed)
New Admission Note:   Arrival Method: Bed  Mental Orientation: Alert and oriented Telemetry: Assessment: Completed Skin: Intact  IV: right hand  Pain: 0/10  Tubes: none  Safety Measures: Safety Fall Prevention Plan has been given, discussed and signed Admission: Completed [facility] Orientation: Patient has been orientated to the room, unit and staff.  Family: Son  Orders have been reviewed and implemented. Will continue to monitor the patient. Call light has been placed within reach and bed alarm has been activated.   Kailee Essman RN [facility] Phone: [phone]

## 2019-03-06 NOTE — Progress Notes (Signed)
PROGRESS NOTE    Taylor Castaneda  JIR:[id number] DOB: [birth year] DOA: 03/05/2019 PCP: [name], MD   Brief Narrative: Taylor Castaneda is a 85 y.o. female with medical history significant of diabetes, hypertension, glaucoma, anemia of chronic disease, chronic kidney disease and hyperlipidemia. She presented secondary to altered mental status with concern for seizures.   Assessment & Plan:   Principal Problem:   Status epilepticus ([place]) Active Problems:   Controlled type 2 diabetes mellitus with diabetic nephropathy, with long-term current use of insulin (HCC)   HLD (hyperlipidemia)   Essential hypertension   CKD stage 3 due to type 2 diabetes mellitus (HCC)   OA (osteoarthritis) of knee   Altered mental status   Altered mental status Concern for possible partial seizure with postictal state/focal status epilepticus. Patient's mental status is improving steadily. MRI obtained which was negative for acute stroke but significant for cerebral atrophy. EEG non-specific. -Neurology recommendations -Telemetry floor -Continue Keppra per neurology  Diabetes mellitus, type 2 Patient is on insulin therapy as an outpatient -Continue Lantus and SSI  Essential hypertension Patient is on Coreg and lisinopril as an outpatient. Currently uncontrolled -Continue Coreg and lisinopril -Hydralazine prn  Hyperlipidemia Last LDL of 206 from labs in September. Patient is on Lipitor. -Continue lipitor  History of gout On colchicine as needed  Leukocytosis Mildly elevated. No evidence of infection.  CKD stage III Stable.  Atrial fibrillation Rate controlled. Apparently new diagnosis. Seemed sinus rhythm today. -Transthoracic Echocardiogram -Continue Coreg  Other medication Patient is on furosemide. Unsure for what diagnosis. -Hold furosemide for now   DVT prophylaxis: Lovenox Code Status:   Code Status: Full Code Family Communication: Daughter at bedside Disposition Plan:  Discharge pending PT/OT and neuro recs in addition to continued medical improvement   Consultants:   Neurology  Procedures:   10/2: EEG  Antimicrobials:  None    Subjective: Patient reports no issues.  Objective: Vitals:   03/06/19 0730 03/06/19 0900 03/06/19 1030 03/06/19 1100  BP: (!) 159/70 (!) 159/61 (!) 162/57 (!) 144/54  Pulse: 75 69 67 67  Resp: 20 20 19  (!) 26  Temp:      SpO2: 98% 99% 99% 95%  Weight:      Height:        Intake/Output Summary (Last 24 hours) at 03/06/2019 1137 Last data filed at 03/06/2019 0057 Gross per 24 hour  Intake 500 ml  Output --  Net 500 ml   Filed Weights   03/05/19 2242  Weight: 55.5 kg    Examination:  General exam: Appears calm and comfortable Respiratory system: Clear to auscultation. Respiratory effort normal. Cardiovascular system: S1 & S2 heard, RRR. No murmurs, rubs, gallops or clicks. Gastrointestinal system: Abdomen is nondistended, soft and nontender. No organomegaly or masses felt. Normal bowel sounds heard. Central nervous system: Alert and oriented to person and that she is in a hospital. She is oriented to the current president. No focal neurological deficits. Extremity weakness. Attempts to follow commands with some difficulty with coordination/task following on finger to nose Extremities: No edema. No calf tenderness Skin: No cyanosis. No rashes Psychiatry: Judgement and insight appear normal. Mood & affect appropriate.     Data Reviewed: I have personally reviewed following labs and imaging studies  CBC: Recent Labs  Lab 03/05/19 2132 03/05/19 2150 03/06/19 0312  WBC  --  14.9* 12.8*  NEUTROABS  --  12.9*  --   HGB 13.3 14.2 11.7*  HCT 39.0 40.9 35.9*  MCV  --  93.2 95.0  PLT  --  221 299   Basic Metabolic Panel: Recent Labs  Lab 03/05/19 2132 03/05/19 2150 03/06/19 0312  NA 138 138 138  K 5.4* 5.0 4.3  CL 107 103 105  CO2  --  20* 20*  GLUCOSE 194* 204* 166*  BUN 38* 32* 29*   CREATININE 1.00 1.22* 1.25*  CALCIUM  --  9.6 8.8*   GFR: Estimated Creatinine Clearance: 26.2 mL/min (A) (by C-G formula based on SCr of 1.25 mg/dL (H)). Liver Function Tests: Recent Labs  Lab 03/05/19 2150 03/06/19 0312  AST 21 19  ALT 19 17  ALKPHOS 111 91  BILITOT 1.3* 0.8  PROT 6.9 6.0*  ALBUMIN 3.8 3.1*   No results for input(s): LIPASE, AMYLASE in the last 168 hours. No results for input(s): AMMONIA in the last 168 hours. Coagulation Profile: Recent Labs  Lab 03/05/19 2150  INR 1.1   Cardiac Enzymes: No results for input(s): CKTOTAL, CKMB, CKMBINDEX, TROPONINI in the last 168 hours. BNP (last 3 results) No results for input(s): PROBNP in the last 8760 hours. HbA1C: No results for input(s): HGBA1C in the last 72 hours. CBG: Recent Labs  Lab 03/05/19 2125 03/06/19 0745  GLUCAP 194* 142*   Lipid Profile: No results for input(s): CHOL, HDL, LDLCALC, TRIG, CHOLHDL, LDLDIRECT in the last 72 hours. Thyroid Function Tests: No results for input(s): TSH, T4TOTAL, FREET4, T3FREE, THYROIDAB in the last 72 hours. Anemia Panel: No results for input(s): VITAMINB12, FOLATE, FERRITIN, TIBC, IRON, RETICCTPCT in the last 72 hours. Sepsis Labs: No results for input(s): PROCALCITON, LATICACIDVEN in the last 168 hours.  Recent Results (from the past 240 hour(s))  SARS CORONAVIRUS 2 (TAT 6-24 HRS) Nasopharyngeal Nasopharyngeal Swab     Status: None   Collection Time: 03/05/19 10:56 PM   Specimen: Nasopharyngeal Swab  Result Value Ref Range Status   SARS Coronavirus 2 NEGATIVE NEGATIVE Final    Comment: (NOTE) SARS-CoV-2 target nucleic acids are NOT DETECTED. The SARS-CoV-2 RNA is generally detectable in upper and lower respiratory specimens during the acute phase of infection. Negative results do not preclude SARS-CoV-2 infection, do not rule out co-infections with other pathogens, and should not be used as the sole basis for treatment or other patient management  decisions. Negative results must be combined with clinical observations, patient history, and epidemiological information. The expected result is Negative. Fact Sheet for Patients: SugarRoll.be Fact Sheet for Healthcare Providers: https://www.woods-mathews.com/ This test is not yet approved or cleared by the [place] FDA and  has been authorized for detection and/or diagnosis of SARS-CoV-2 by FDA under an Emergency Use Authorization (EUA). This EUA will remain  in effect (meaning this test can be used) for the duration of the COVID-19 declaration under Section 56 4(b)(1) of the Act, 21 U.S.C. section 360bbb-3(b)(1), unless the authorization is terminated or revoked sooner. Performed at [facility], [place] [address], [place], [place] [address]          Radiology Studies: Ct Code Stroke Cta Head W/wo Contrast  Result Date: 03/05/2019 CLINICAL DATA:  85 year old female code stroke presentation with rightward gaze deviation, seizure like activity. EXAM: CT ANGIOGRAPHY HEAD AND NECK TECHNIQUE: Multidetector CT imaging of the head and neck was performed using the standard protocol during bolus administration of intravenous contrast. Multiplanar CT image reconstructions and MIPs were obtained to evaluate the vascular anatomy. Carotid stenosis measurements (when applicable) are obtained utilizing NASCET criteria, using the distal internal carotid diameter as the denominator. CONTRAST:  100 milliliters Omnipaque 350. COMPARISON:  Plain head CT 2139 hours today. FINDINGS: Intermittent motion artifact. CTA NECK Skeleton: Absent dentition. No acute osseous abnormality identified. Upper chest: Negative upper lungs. Subcentimeter mediastinal lymph nodes are within normal limits. Other neck: No acute findings in the neck. Aortic arch: 3 vessel arch configuration. Mild to moderate arch atherosclerosis, most affecting the subclavian artery origins. Right  carotid system: No brachiocephalic or right CCA origin stenosis. Negative right carotid bifurcation aside from minor right ECA origin calcified plaque. Negative cervical right ICA. Left carotid system: Negative left CCA. Mild calcified plaque at the left ECA origin. Negative cervical left ICA. Vertebral arteries: Calcified plaque at the right subclavian artery origin with 50% stenosis. Normal right vertebral artery origin. Patent and normal right vertebral artery to the skull base. Calcified plaque at the left subclavian artery origin with less than 50 % stenosis with respect to the distal vessel. Normal left vertebral artery origin. The left vertebral is non dominant, patent to the skull base. CTA HEAD Posterior circulation: No distal vertebral artery stenosis. Mildly dominant right V4 segment. Patent right PICA and dominant appearing left AICA origins. Patent vertebrobasilar junction without stenosis. The basilar artery is patent but diminutive. Basilar detail is mildly obscured by motion. The basilar appears to terminates at the SCA origins which are patent. Fetal type bilateral PCA origins. Bilateral PCA branches are within normal limits. Anterior circulation: Both ICA siphons are patent. On the left there is bulky cavernous segment calcified plaque with possible high-grade stenosis in the anterior genu. The left ICA terminus is patent. Normal left posterior communicating artery. On the right there is similar bulky distal cavernous and anterior genu segment calcified plaque with possible high-grade stenosis but the right ICA terminus is patent. Normal right posterior communicating artery origin. Patent MCA and ACA origins. Anterior communicating artery and ACA branches are within normal limits. Left M1, bifurcation and left MCA branches are within normal limits. There is a greater degree of motion at the right MCA branches but the M1, right MCA bifurcation, and right MCA branches appear to remain patent. Venous  sinuses: Early contrast timing, grossly patent. Anatomic variants: Dominant right vertebral artery. Fetal type bilateral PCA origins resulting in diminutive basilar artery. Review of the MIP images confirms the above findings IMPRESSION: 1. Intermittently degraded by motion but negative for large vessel occlusion. This was discussed by telephone with Dr. Samara Snide on 03/05/2019 at 2213 hours. 2. Possible high-grade bilateral ICA siphon stenosis due to calcified plaque. Stenosis appears maximal at the bilateral ICA anterior genu. 3. No posterior circulation stenosis. Bilateral subclavian artery origin plaque with up to 50% right subclavian stenosis. Electronically Signed   By: Genevie Ann M.D.   On: 03/05/2019 22:32   Ct Code Stroke Cta Neck W/wo Contrast  Result Date: 03/05/2019 CLINICAL DATA:  85 year old female code stroke presentation with rightward gaze deviation, seizure like activity. EXAM: CT ANGIOGRAPHY HEAD AND NECK TECHNIQUE: Multidetector CT imaging of the head and neck was performed using the standard protocol during bolus administration of intravenous contrast. Multiplanar CT image reconstructions and MIPs were obtained to evaluate the vascular anatomy. Carotid stenosis measurements (when applicable) are obtained utilizing NASCET criteria, using the distal internal carotid diameter as the denominator. CONTRAST:  100 milliliters Omnipaque 350. COMPARISON:  Plain head CT 2139 hours today. FINDINGS: Intermittent motion artifact. CTA NECK Skeleton: Absent dentition. No acute osseous abnormality identified. Upper chest: Negative upper lungs. Subcentimeter mediastinal lymph nodes are within normal limits. Other neck: No  acute findings in the neck. Aortic arch: 3 vessel arch configuration. Mild to moderate arch atherosclerosis, most affecting the subclavian artery origins. Right carotid system: No brachiocephalic or right CCA origin stenosis. Negative right carotid bifurcation aside from minor right ECA  origin calcified plaque. Negative cervical right ICA. Left carotid system: Negative left CCA. Mild calcified plaque at the left ECA origin. Negative cervical left ICA. Vertebral arteries: Calcified plaque at the right subclavian artery origin with 50% stenosis. Normal right vertebral artery origin. Patent and normal right vertebral artery to the skull base. Calcified plaque at the left subclavian artery origin with less than 50 % stenosis with respect to the distal vessel. Normal left vertebral artery origin. The left vertebral is non dominant, patent to the skull base. CTA HEAD Posterior circulation: No distal vertebral artery stenosis. Mildly dominant right V4 segment. Patent right PICA and dominant appearing left AICA origins. Patent vertebrobasilar junction without stenosis. The basilar artery is patent but diminutive. Basilar detail is mildly obscured by motion. The basilar appears to terminates at the SCA origins which are patent. Fetal type bilateral PCA origins. Bilateral PCA branches are within normal limits. Anterior circulation: Both ICA siphons are patent. On the left there is bulky cavernous segment calcified plaque with possible high-grade stenosis in the anterior genu. The left ICA terminus is patent. Normal left posterior communicating artery. On the right there is similar bulky distal cavernous and anterior genu segment calcified plaque with possible high-grade stenosis but the right ICA terminus is patent. Normal right posterior communicating artery origin. Patent MCA and ACA origins. Anterior communicating artery and ACA branches are within normal limits. Left M1, bifurcation and left MCA branches are within normal limits. There is a greater degree of motion at the right MCA branches but the M1, right MCA bifurcation, and right MCA branches appear to remain patent. Venous sinuses: Early contrast timing, grossly patent. Anatomic variants: Dominant right vertebral artery. Fetal type bilateral PCA  origins resulting in diminutive basilar artery. Review of the MIP images confirms the above findings IMPRESSION: 1. Intermittently degraded by motion but negative for large vessel occlusion. This was discussed by telephone with Dr. Samara Snide on 03/05/2019 at 2213 hours. 2. Possible high-grade bilateral ICA siphon stenosis due to calcified plaque. Stenosis appears maximal at the bilateral ICA anterior genu. 3. No posterior circulation stenosis. Bilateral subclavian artery origin plaque with up to 50% right subclavian stenosis. Electronically Signed   By: Genevie Ann M.D.   On: 03/05/2019 22:32   Mr Brain Wo Contrast  Result Date: 03/06/2019 CLINICAL DATA:  Altered mental status with unclear cause EXAM: MRI HEAD WITHOUT CONTRAST TECHNIQUE: Multiplanar, multiecho pulse sequences of the brain and surrounding structures were obtained without intravenous contrast. COMPARISON:  CT and CT perfusion from yesterday FINDINGS: Brain: No acute infarction, hemorrhage, hydrocephalus, extra-axial collection or mass lesion. Advanced cerebral atrophy with generalized cortical thinning. Mild small vessel ischemic gliosis in the cerebral white matter. 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 Vascular: Normal flow voids Skull and upper cervical spine: Normal marrow signal Sinuses/Orbits: Negative IMPRESSION: 1. No acute finding. 2. Advanced cerebral atrophy. Electronically Signed   By: Monte Fantasia M.D.   On: 03/06/2019 04:25   Ct Cerebral Perfusion W Contrast  Result Date: 03/05/2019 CLINICAL DATA:  85 year old female code stroke presentation with rightward gaze deviation, seizure like activity. EXAM: CT PERFUSION BRAIN TECHNIQUE: Multiphase CT imaging of the brain was performed following IV bolus contrast injection. Subsequent parametric perfusion maps were calculated using  RAPID software. CONTRAST:  112mL OMNIPAQUE IOHEXOL 350 MG/ML SOLN COMPARISON:  Plain head CT today 2139 hours.  CTA head and neck.  FINDINGS: CT Brain Perfusion Findings: ASPECTS on noncontrast CT Head: 37 (age indeterminate right lentiform lacune) at 2139 hours today. CBF (<30%) Volume: None Perfusion (Tmax>6.0s) volume: None Mismatch Volume: Not applicable Infarction Location:Not applicable IMPRESSION: 1. No infarct core or ischemic penumbra detected by CT Perfusion. 2. CTA head and neck reported separately. Electronically Signed   By: Genevie Ann M.D.   On: 03/05/2019 22:35   Ct Head Code Stroke Wo Contrast  Result Date: 03/05/2019 CLINICAL DATA:  Code stroke. 85 year old female last seen normal at 0 800 hours. Rightward gaze deviation. EXAM: CT HEAD WITHOUT CONTRAST TECHNIQUE: Contiguous axial images were obtained from the base of the skull through the vertex without intravenous contrast. COMPARISON:  None. FINDINGS: Brain: Small dystrophic basal ganglia calcifications. Hypodense lacunar infarct tracking from the right corona radiata into the right lentiform. No associated hemorrhage or mass effect. Superimposed confluent bilateral cerebral white matter hypodensity. No midline shift, ventriculomegaly, mass effect, evidence of mass lesion, intracranial hemorrhage or evidence of cortically based acute infarction. Vascular: Calcified atherosclerosis at the skull base. No suspicious intracranial vascular hyperdensity. Skull: Osteopenia.  No acute osseous abnormality identified. Sinuses/Orbits: Trace paranasal sinus mucosal thickening. Other: Mild rightward gaze deviation. No other acute orbit or scalp soft tissue findings. ASPECTS [facility] Stroke Program Early CT Score) - Ganglionic level infarction (caudate, lentiform nuclei, internal capsule, insula, M1-M3 cortex): 6 - Supraganglionic infarction (M4-M6 cortex): 3 Total score (0-10 with 10 being normal): 9 IMPRESSION: 1. Age indeterminate lacunar infarct at the right corona radiata and lentiform. No associated hemorrhage or mass effect. ASPECTS 9. 2. Underlying bilateral white matter disease. No  acute intracranial hemorrhage or acute cortically based infarct identified. These results were communicated to Dr. Lorraine Lax at 9:48 pm on 03/05/2019 by text page via the [facility] messaging system. Electronically Signed   By: Genevie Ann M.D.   On: 03/05/2019 21:49        Scheduled Meds:  atorvastatin  20 mg Oral q1800   carvedilol  25 mg Oral BID   cholecalciferol  2,500 Units Oral Daily   enoxaparin (LOVENOX) injection  30 mg Subcutaneous Q24H   furosemide  20 mg Oral Daily   insulin aspart  0-5 Units Subcutaneous QHS   insulin aspart  0-9 Units Subcutaneous TID WC   insulin aspart  10 Units Subcutaneous TID WC   Insulin Glargine  30 Units Subcutaneous QHS   ramipril  10 mg Oral Daily   Continuous Infusions:  sodium chloride     sodium chloride 100 mL/hr at 03/06/19 0057   [id number]       LOS: 1 day     Cordelia Poche, MD [facility] 03/06/2019, 11:37 AM  If 7PM-7AM, please contact night-coverage [email or web address]

## 2019-03-06 NOTE — Procedures (Signed)
Patient Name: Taylor Castaneda  MRN: [id number]  Epilepsy Attending: Lora Havens  Referring Physician/Provider: Dr. Karena Addison Aroor Date: 03/05/2019 Duration: 23.44 minutes  Patient history: 85 year old female with altered mental status and possible gaze deviation.  EEG to assess for subclinical status.  Level of alertness: Sedated  AEDs during EEG study: Keppra, Ativan  Technical aspects: This EEG study was done with scalp electrodes positioned according to the 10-20 International system of electrode placement. Electrical activity was acquired at a sampling rate of 500Hz  and reviewed with a high frequency filter of 70Hz  and a low frequency filter of 1Hz . EEG data were recorded continuously and digitally stored.   Description: EEG showed continuous low voltage 2 to 3 Hz delta slowing.  EEG was reactive to tactile stimuli.  Hyperventilation and photic stimulation were not performed.  Abnormality -Continuous slow, generalized   IMPRESSION: This study is suggestive of moderate to severe diffuse encephalopathy, likely secondary to sedated state.  No seizures or epileptiform discharges were seen throughout the recording.  Byron Tipping Barbra Sarks

## 2019-03-06 NOTE — Progress Notes (Signed)
  Echocardiogram 2D Echocardiogram has been performed.  Taylor Castaneda 03/06/2019, 3:58 PM

## 2019-03-06 NOTE — ED Notes (Signed)
Urine culture sent with urine sample. 

## 2019-03-06 NOTE — Progress Notes (Signed)
Subjective: I spoke with the patient's son, on his discovering her down, she had rhythmic twitching of the right upper extremity associated with eye blinking and her eyes "rolled back in her head" this lasted for approximately 10 minutes stopping about the time her daughter got there.  Over the course of the day, she has continually improved.  She reports that she does have episodes where she "blanks out" and it is time at home, but these have not been witnessed by others, and significance is slightly less clear because daughter reports that she does have some trouble with her short-term memory.  Exam: Vitals:   03/06/19 1430 03/06/19 1500  BP: (!) 153/61 (!) 145/65  Pulse: 62 63  Resp: (!) 27 (!) 26  Temp:    SpO2: 99% 100%   Gen: In bed, NAD Resp: non-labored breathing, no acute distress Abd: soft, nt  Neuro: MS: Awake, alert, oriented to month but not year CN: Visual fields full, EOMI Motor: Moves all extremities well  Pertinent Labs: Creatinine 1.25, GFR 39 EEG and MRI are negative  Impression: 85 year old female with what I suspect is new onset seizures.  Given her short-term memory loss, likely secondary to early dementia.  Given the prolonged nature, and possible spells described at home, I would favor starting antiepileptic therapy.  She does have some mild renal insufficiency, so I would start with a low-dose of Keppra 250 twice daily.  Recommendations: 1) Keppra 250 twice daily 2) follow-up with outpatient neurology, neurology will sign off please call if further questions or concerns.  Roland Rack, MD [facility] [phone]  If 7pm- 7am, please page neurology on call as listed in [place].

## 2019-03-06 NOTE — ED Notes (Signed)
Patient transported to MRI 

## 2019-03-06 NOTE — Plan of Care (Signed)
  Problem: Activity: Goal: Risk for activity intolerance will decrease Outcome: Progressing   

## 2019-03-07 LAB — GLUCOSE, CAPILLARY
Glucose-Capillary: 105 mg/dL — ABNORMAL HIGH (ref 70–99)
Glucose-Capillary: 193 mg/dL — ABNORMAL HIGH (ref 70–99)

## 2019-03-07 MED ORDER — LEVETIRACETAM 250 MG PO TABS: 250.0000 mg | ORAL_TABLET | Freq: Two times a day (BID) | ORAL | 0 refills | Status: DC

## 2019-03-07 MED ORDER — ASPIRIN 81 MG PO TBEC: 81.0000 mg | DELAYED_RELEASE_TABLET | Freq: Every day | ORAL | 0 refills | Status: DC

## 2019-03-07 NOTE — Progress Notes (Signed)
Nsg Discharge Note  Admit Date:  03/05/2019 Discharge date: 03/07/2019   FLORINDA TAFLINGER to be D/C'd home with daughter per MD order.  AVS completed. Patient/caregiver able to verbalize understanding.  Discharge Medication: Allergies as of 03/07/2019      Reactions   Pravastatin Sodium    REACTION: anemia as per Dr Gilford Rile, [facility]      Medication List    TAKE these medications   aspirin 81 MG EC tablet Take 1 tablet (81 mg total) by mouth daily. Swallow whole.   atorvastatin 20 MG tablet Commonly known as: LIPITOR Take 1 tablet (20 mg total) by mouth daily at 6 PM.   B-D ULTRAFINE III SHORT PEN 31G X 8 MM Misc Generic drug: Insulin Pen Needle USE AS DIRECTED WITH INSULIN   carvedilol 25 MG tablet Commonly known as: COREG Take 1 tablet (25 mg total) by mouth 2 (two) times daily.   Clear Eyes for Dry Eyes 1-0.25 % Soln Generic drug: Carboxymethylcellul-Glycerin Place 1-2 drops into both eyes 3 (three) times daily as needed (for dry/irritated eyes.).   colchicine 0.6 MG tablet Take 1 tablet (0.6 mg total) by mouth daily as needed (gout flare). First day of gout flare, may take 1 tablet twice daily.   furosemide 40 MG tablet Commonly known as: LASIX Take 0.5 tablets (20 mg total) by mouth daily.   glucose blood test strip Commonly known as: FREESTYLE LITE Use as instructed What changed:   how much to take  how to take this  when to take this   HumaLOG KwikPen 100 UNIT/ML KwikPen Generic drug: insulin lispro Inject 1-10 Units into the skin 3 (three) times daily before meals. Sliding scale   Lantus SoloStar 100 UNIT/ML Solostar Pen Generic drug: Insulin Glargine Inject 30 Units into the skin at bedtime.   levETIRAcetam 250 MG tablet Commonly known as: KEPPRA Take 1 tablet (250 mg total) by mouth 2 (two) times daily.   potassium chloride 10 MEQ tablet Commonly known as: KLOR-CON TAKE 1  BY MOUTH ON MONDAY, WEDNESDAY AND FRIDAY What changed:   how much to  take  how to take this  when to take this  additional instructions   ramipril 10 MG capsule Commonly known as: ALTACE Take 1 capsule (10 mg total) by mouth daily.   VITAMIN D3 PO Take 2,500 Units by mouth daily.       Discharge Assessment: Vitals:   03/07/19 0439 03/07/19 0915  BP: (!) 153/50 (!) 160/60  Pulse: 64 66  Resp: 16 18  Temp: 98 F (36.7 C) 97.7 F (36.5 C)  SpO2: 99% 99%   Skin clean, dry and intact without evidence of skin break down, no evidence of skin tears noted. IV catheter discontinued intact. Site without signs and symptoms of complications - no redness or edema noted at insertion site, patient denies c/o pain - only slight tenderness at site.  Dressing with slight pressure applied.  D/c Instructions-Education: Discharge instructions given to patient/family with verbalized understanding. D/c education completed with patient/family including follow up instructions, medication list, d/c activities limitations if indicated, with other d/c instructions as indicated by MD - patient able to verbalize understanding, all questions fully answered. Patient instructed to return to ED, call 911, or call MD for any changes in condition.  Patient escorted via [place], and D/C home via private auto.  Atilano Ina, RN 03/07/2019 4:13 PM

## 2019-03-07 NOTE — Evaluation (Signed)
Physical Therapy Evaluation Patient Details Name: Taylor Castaneda MRN: [id number] DOB: July 16, 1932 Today's Date: 03/07/2019   History of Present Illness  Pt is an 85 y/o female admitted after being found down in the floor by family members. MRI of the brain was negative for any acute findings. Neurology was consulted and suspect new onset seizures. PMH including but not limited to DM, HTN, glaucoma and colon cancer.    Clinical Impression  Pt presented supine in bed with HOB elevated, awake and willing to participate in therapy session. Prior to admission, pt reported that she was independent with ADLs, IADLs and occasionally uses a cane to ambulate. Pt's daughter present throughout session as well. Pt lives alone but stated that she will have family with her 24/7 after d/c until she recovers. At the time of evaluation, pt moving well during bed mobility and transfers. However, very unsteady with ambulation and requiring frequent min A for balance. Pt would continue to benefit from skilled physical therapy services at this time while admitted and after d/c to address the below listed limitations in order to improve overall safety and independence with functional mobility.     Follow Up Recommendations Outpatient PT;Supervision/Assistance - 24 hour    Equipment Recommendations  None recommended by PT;Other (comment)(pt reported having a RW at home)    Recommendations for Other Services       Precautions / Restrictions Precautions Precautions: Fall Restrictions Weight Bearing Restrictions: No      Mobility  Bed Mobility Overal bed mobility: Needs Assistance Bed Mobility: Supine to Sit     Supine to sit: Min guard     General bed mobility comments: increased time and effort needed, min guard for safety  Transfers Overall transfer level: Needs assistance Equipment used: None Transfers: Sit to/from Stand Sit to Stand: Min guard         General transfer comment: min guard for  safety with transition into standing from seated position on bed  Ambulation/Gait Ambulation/Gait assistance: Min assist;Min guard Gait Distance (Feet): 50 Feet Assistive device: 1 person hand held assist;IV Pole Gait Pattern/deviations: Step-through pattern;Decreased step length - right;Decreased step length - left;Decreased stride length;Drifts right/left;Narrow base of support Gait velocity: decreased   General Gait Details: pt initially using a shuffling type gait pattern when navigating within her room. Once in the hallway pt with improved step length; however, consistant R drift requiring frequent min A to maintain balance. Pt also colliding with objects on her right frequently  Stairs            Wheelchair Mobility    Modified Rankin (Stroke Patients Only)       Balance Overall balance assessment: Needs assistance Sitting-balance support: Feet supported Sitting balance-Leahy Scale: Good     Standing balance support: Single extremity supported;Bilateral upper extremity supported Standing balance-Leahy Scale: Poor                               Pertinent Vitals/Pain Pain Assessment: No/denies pain    Home Living Family/patient expects to be discharged to:: Private residence Living Arrangements: Alone Available Help at Discharge: Family;Available 24 hours/day Type of Home: House Home Access: Level entry     Home Layout: One level Home Equipment: Walker - 2 wheels;Cane - single point      Prior Function Level of Independence: Independent with assistive device(s)         Comments: ambulates with use of a cane PRN  Hand Dominance        Extremity/Trunk Assessment   Upper Extremity Assessment Upper Extremity Assessment: Overall WFL for tasks assessed    Lower Extremity Assessment Lower Extremity Assessment: Overall WFL for tasks assessed    Cervical / Trunk Assessment Cervical / Trunk Assessment: Kyphotic  Communication    Communication: No difficulties  Cognition Arousal/Alertness: Awake/alert Behavior During Therapy: WFL for tasks assessed/performed Overall Cognitive Status: Impaired/Different from baseline Area of Impairment: Safety/judgement                         Safety/Judgement: Decreased awareness of safety;Decreased awareness of deficits            General Comments      Exercises     Assessment/Plan    PT Assessment Patient needs continued PT services  PT Problem List Decreased balance;Decreased mobility;Decreased coordination;Decreased safety awareness       PT Treatment Interventions DME instruction;Gait training;Stair training;Functional mobility training;Therapeutic activities;Therapeutic exercise;Balance training;Neuromuscular re-education;Patient/family education    PT Goals (Current goals can be found in the Care Plan section)  Acute Rehab PT Goals Patient Stated Goal: return to independence PT Goal Formulation: With patient/family Time For Goal Achievement: 03/21/19 Potential to Achieve Goals: Good    Frequency Min 3X/week   Barriers to discharge        Co-evaluation               AM-PAC PT "6 Clicks" Mobility  Outcome Measure Help needed turning from your back to your side while in a flat bed without using bedrails?: None Help needed moving from lying on your back to sitting on the side of a flat bed without using bedrails?: None Help needed moving to and from a bed to a chair (including a wheelchair)?: A Little Help needed standing up from a chair using your arms (e.g., wheelchair or bedside chair)?: A Little Help needed to walk in hospital room?: A Little Help needed climbing 3-5 steps with a railing? : A Lot 6 Click Score: 19    End of Session Equipment Utilized During Treatment: Gait belt Activity Tolerance: Patient tolerated treatment well Patient left: in bed;with call bell/phone within reach;with family/visitor present;Other  (comment)(seated EOB) Nurse Communication: Mobility status PT Visit Diagnosis: Other abnormalities of gait and mobility (R26.89);Difficulty in walking, not elsewhere classified (R26.2)    Time: 1126-1150 PT Time Calculation (min) (ACUTE ONLY): 24 min   Charges:   PT Evaluation $PT Eval Moderate Complexity: 1 Mod PT Treatments $Gait Training: 8-22 mins        Sherie Don, [place], DPT  Acute Rehabilitation Services Pager [phone] Office [place] 03/07/2019, 12:13 PM

## 2019-03-07 NOTE — Discharge Summary (Signed)
Physician Discharge Summary  NOA [name]:[id number] DOB: 1932-06-23 DOA: 03/05/2019  PCP: [name], MD  Admit date: 03/05/2019 Discharge date: 03/07/2019  Admitted From: Home Disposition: Home  Recommendations for Outpatient Follow-up:  1. Follow up with PCP in 1 week 2. Please obtain BMP/CBC in one week 3. Please follow up on the following pending results: None  Home Health: Outpatient PT Equipment/Devices: None (patient has a RW at home)  Discharge Condition: Stable CODE STATUS: Full code Diet recommendation: Heart healthy   Brief/Interim Summary:  Admission HPI written by Gala Romney, MD   Chief Complaint: Altered mental status  HPI: Taylor Castaneda is a 85 y.o. female with medical history significant of diabetes, hypertension, glaucoma, anemia of chronic disease, chronic kidney disease and hyperlipidemia who was brought in secondary to altered mental status.  Patient was apparently found on the floor by family several hours after she was last seen normal.  Daughter last saw her normal around 8:00 in the morning but a neighbor apparently has seen her around noon.  The son went to check on her this evening and found her on the floor and called EMS.  A code stroke was initially activated in the ER but all work-up has been negative.  No TPA has been given.  Patient was seen by neurology and at this point suspected status epilepticus.  Patient has received Ativan and currently has come out and is awake.  Able to communicate.  No observed neurologic deficits.  No known sick contacts..  ED Course: Temperature is 99, blood pressure 174/63, pulse 97 respiratory 32 oxygen sat 92% on room air.  White count is 14.9, hemoglobin 14.2 and platelets 221.  Sodium is 138 potassium 5.0 chloride 103 CO2 20 BUN 32 creatinine 1.22 and calcium 9.6.  Glucose 204.  Head CT without contrast showed no acute findings.  CT angiogram showed intermittently degraded by motion but possible  high-grade bilateral ICA stenosis due to plaques.  Patient will be admitted for both stroke rule out as well as treatment for status epilepticus    Hospital course:  Altered mental status Concern for possible partial seizure with postictal state/focal status epilepticus. Neurology consulted and patient started on Keppra 200 mg BID. Mental status improved to baseline. MRI negative for stroke. EEG non-specific.  Diabetes mellitus, type 2 Patient is on insulin therapy as an outpatient. Continue on discharge.  Essential hypertension Patient is on Coreg and lisinopril as an outpatient. Currently uncontrolled. Continue Coreg and Lisinopril.  Hyperlipidemia Last LDL of 206 from labs in September. Patient is on Lipitor. Continue on discharge.  History of gout On colchicine as needed  Leukocytosis Mildly elevated. No evidence of infection.  CKD stage III Stable.  Atrial fibrillation Transient. Did not start anticoagulation secondary to history of falls. Will need to follow-up with cardiology. Rate controlled on Coreg. Converted to sinus rhythm on discharge. Discharged with aspirin as well as Coreg.   Discharge Diagnoses:  Principal Problem:   Status epilepticus (Taylor Castaneda) Active Problems:   Controlled type 2 diabetes mellitus with diabetic nephropathy, with long-term current use of insulin (HCC)   HLD (hyperlipidemia)   Essential hypertension   CKD stage 3 due to type 2 diabetes mellitus (HCC)   OA (osteoarthritis) of knee   Altered mental status    Discharge Instructions  Discharge Instructions    Diet - low sodium heart healthy   Complete by: As directed    Increase activity slowly   Complete by: As directed  Allergies as of 03/07/2019      Reactions   Pravastatin Sodium    REACTION: anemia as per Dr Gilford Rile, [facility]      Medication List    TAKE these medications   aspirin 81 MG EC tablet Take 1 tablet (81 mg total) by mouth daily. Swallow whole.     atorvastatin 20 MG tablet Commonly known as: LIPITOR Take 1 tablet (20 mg total) by mouth daily at 6 PM.   B-D ULTRAFINE III SHORT PEN 31G X 8 MM Misc Generic drug: Insulin Pen Needle USE AS DIRECTED WITH INSULIN   carvedilol 25 MG tablet Commonly known as: COREG Take 1 tablet (25 mg total) by mouth 2 (two) times daily.   Clear Eyes for Dry Eyes 1-0.25 % Soln Generic drug: Carboxymethylcellul-Glycerin Place 1-2 drops into both eyes 3 (three) times daily as needed (for dry/irritated eyes.).   colchicine 0.6 MG tablet Take 1 tablet (0.6 mg total) by mouth daily as needed (gout flare). First day of gout flare, may take 1 tablet twice daily.   furosemide 40 MG tablet Commonly known as: LASIX Take 0.5 tablets (20 mg total) by mouth daily.   glucose blood test strip Commonly known as: FREESTYLE LITE Use as instructed What changed:   how much to take  how to take this  when to take this   HumaLOG KwikPen 100 UNIT/ML KwikPen Generic drug: insulin lispro Inject 1-10 Units into the skin 3 (three) times daily before meals. Sliding scale   Lantus SoloStar 100 UNIT/ML Solostar Pen Generic drug: Insulin Glargine Inject 30 Units into the skin at bedtime.   levETIRAcetam 250 MG tablet Commonly known as: KEPPRA Take 1 tablet (250 mg total) by mouth 2 (two) times daily.   potassium chloride 10 MEQ tablet Commonly known as: KLOR-CON TAKE 1  BY MOUTH ON MONDAY, WEDNESDAY AND FRIDAY What changed:   how much to take  how to take this  when to take this  additional instructions   ramipril 10 MG capsule Commonly known as: ALTACE Take 1 capsule (10 mg total) by mouth daily.   VITAMIN D3 PO Take 2,500 Units by mouth daily.      Follow-up Information    [name], MD. Schedule an appointment as soon as possible for a visit in 1 week(s).   Specialty: Family Medicine Contact information: [place] [address] [phone]         [facility]. Schedule an appointment as soon as possible for a visit in 2 week(s).   Specialty: Neurology Why: Seizures Contact information: [place] [phone]         Allergies  Allergen Reactions   Pravastatin Sodium     REACTION: anemia as per Dr Gilford Rile, [facility]    Consultations:  Neurology   Procedures/Studies: Ct Code Stroke Cta Head W/wo Contrast  Result Date: 03/05/2019 CLINICAL DATA:  85 year old female code stroke presentation with rightward gaze deviation, seizure like activity. EXAM: CT ANGIOGRAPHY HEAD AND NECK TECHNIQUE: Multidetector CT imaging of the head and neck was performed using the standard protocol during bolus administration of intravenous contrast. Multiplanar CT image reconstructions and MIPs were obtained to evaluate the vascular anatomy. Carotid stenosis measurements (when applicable) are obtained utilizing NASCET criteria, using the distal internal carotid diameter as the denominator. CONTRAST:  100 milliliters Omnipaque 350. COMPARISON:  Plain head CT 2139 hours today. FINDINGS: Intermittent motion artifact. CTA NECK Skeleton: Absent dentition. No  acute osseous abnormality identified. Upper chest: Negative upper lungs. Subcentimeter mediastinal lymph nodes are within normal limits. Other neck: No acute findings in the neck. Aortic arch: 3 vessel arch configuration. Mild to moderate arch atherosclerosis, most affecting the subclavian artery origins. Right carotid system: No brachiocephalic or right CCA origin stenosis. Negative right carotid bifurcation aside from minor right ECA origin calcified plaque. Negative cervical right ICA. Left carotid system: Negative left CCA. Mild calcified plaque at the left ECA origin. Negative cervical left ICA. Vertebral arteries: Calcified plaque at the right subclavian artery origin with 50% stenosis. Normal right vertebral artery origin. Patent and  normal right vertebral artery to the skull base. Calcified plaque at the left subclavian artery origin with less than 50 % stenosis with respect to the distal vessel. Normal left vertebral artery origin. The left vertebral is non dominant, patent to the skull base. CTA HEAD Posterior circulation: No distal vertebral artery stenosis. Mildly dominant right V4 segment. Patent right PICA and dominant appearing left AICA origins. Patent vertebrobasilar junction without stenosis. The basilar artery is patent but diminutive. Basilar detail is mildly obscured by motion. The basilar appears to terminates at the SCA origins which are patent. Fetal type bilateral PCA origins. Bilateral PCA branches are within normal limits. Anterior circulation: Both ICA siphons are patent. On the left there is bulky cavernous segment calcified plaque with possible high-grade stenosis in the anterior genu. The left ICA terminus is patent. Normal left posterior communicating artery. On the right there is similar bulky distal cavernous and anterior genu segment calcified plaque with possible high-grade stenosis but the right ICA terminus is patent. Normal right posterior communicating artery origin. Patent MCA and ACA origins. Anterior communicating artery and ACA branches are within normal limits. Left M1, bifurcation and left MCA branches are within normal limits. There is a greater degree of motion at the right MCA branches but the M1, right MCA bifurcation, and right MCA branches appear to remain patent. Venous sinuses: Early contrast timing, grossly patent. Anatomic variants: Dominant right vertebral artery. Fetal type bilateral PCA origins resulting in diminutive basilar artery. Review of the MIP images confirms the above findings IMPRESSION: 1. Intermittently degraded by motion but negative for large vessel occlusion. This was discussed by telephone with Dr. Samara Snide on 03/05/2019 at 2213 hours. 2. Possible high-grade bilateral ICA  siphon stenosis due to calcified plaque. Stenosis appears maximal at the bilateral ICA anterior genu. 3. No posterior circulation stenosis. Bilateral subclavian artery origin plaque with up to 50% right subclavian stenosis. Electronically Signed   By: Genevie Ann M.D.   On: 03/05/2019 22:32   Ct Code Stroke Cta Neck W/wo Contrast  Result Date: 03/05/2019 CLINICAL DATA:  85 year old female code stroke presentation with rightward gaze deviation, seizure like activity. EXAM: CT ANGIOGRAPHY HEAD AND NECK TECHNIQUE: Multidetector CT imaging of the head and neck was performed using the standard protocol during bolus administration of intravenous contrast. Multiplanar CT image reconstructions and MIPs were obtained to evaluate the vascular anatomy. Carotid stenosis measurements (when applicable) are obtained utilizing NASCET criteria, using the distal internal carotid diameter as the denominator. CONTRAST:  100 milliliters Omnipaque 350. COMPARISON:  Plain head CT 2139 hours today. FINDINGS: Intermittent motion artifact. CTA NECK Skeleton: Absent dentition. No acute osseous abnormality identified. Upper chest: Negative upper lungs. Subcentimeter mediastinal lymph nodes are within normal limits. Other neck: No acute findings in the neck. Aortic arch: 3 vessel arch configuration. Mild to moderate arch atherosclerosis, most affecting the subclavian artery origins.  Right carotid system: No brachiocephalic or right CCA origin stenosis. Negative right carotid bifurcation aside from minor right ECA origin calcified plaque. Negative cervical right ICA. Left carotid system: Negative left CCA. Mild calcified plaque at the left ECA origin. Negative cervical left ICA. Vertebral arteries: Calcified plaque at the right subclavian artery origin with 50% stenosis. Normal right vertebral artery origin. Patent and normal right vertebral artery to the skull base. Calcified plaque at the left subclavian artery origin with less than 50 %  stenosis with respect to the distal vessel. Normal left vertebral artery origin. The left vertebral is non dominant, patent to the skull base. CTA HEAD Posterior circulation: No distal vertebral artery stenosis. Mildly dominant right V4 segment. Patent right PICA and dominant appearing left AICA origins. Patent vertebrobasilar junction without stenosis. The basilar artery is patent but diminutive. Basilar detail is mildly obscured by motion. The basilar appears to terminates at the SCA origins which are patent. Fetal type bilateral PCA origins. Bilateral PCA branches are within normal limits. Anterior circulation: Both ICA siphons are patent. On the left there is bulky cavernous segment calcified plaque with possible high-grade stenosis in the anterior genu. The left ICA terminus is patent. Normal left posterior communicating artery. On the right there is similar bulky distal cavernous and anterior genu segment calcified plaque with possible high-grade stenosis but the right ICA terminus is patent. Normal right posterior communicating artery origin. Patent MCA and ACA origins. Anterior communicating artery and ACA branches are within normal limits. Left M1, bifurcation and left MCA branches are within normal limits. There is a greater degree of motion at the right MCA branches but the M1, right MCA bifurcation, and right MCA branches appear to remain patent. Venous sinuses: Early contrast timing, grossly patent. Anatomic variants: Dominant right vertebral artery. Fetal type bilateral PCA origins resulting in diminutive basilar artery. Review of the MIP images confirms the above findings IMPRESSION: 1. Intermittently degraded by motion but negative for large vessel occlusion. This was discussed by telephone with Dr. Samara Snide on 03/05/2019 at 2213 hours. 2. Possible high-grade bilateral ICA siphon stenosis due to calcified plaque. Stenosis appears maximal at the bilateral ICA anterior genu. 3. No posterior  circulation stenosis. Bilateral subclavian artery origin plaque with up to 50% right subclavian stenosis. Electronically Signed   By: Genevie Ann M.D.   On: 03/05/2019 22:32   Mr Brain Wo Contrast  Result Date: 03/06/2019 CLINICAL DATA:  Altered mental status with unclear cause EXAM: MRI HEAD WITHOUT CONTRAST TECHNIQUE: Multiplanar, multiecho pulse sequences of the brain and surrounding structures were obtained without intravenous contrast. COMPARISON:  CT and CT perfusion from yesterday FINDINGS: Brain: No acute infarction, hemorrhage, hydrocephalus, extra-axial collection or mass lesion. Advanced cerebral atrophy with generalized cortical thinning. Mild small vessel ischemic gliosis in the cerebral white matter. 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 Vascular: Normal flow voids Skull and upper cervical spine: Normal marrow signal Sinuses/Orbits: Negative IMPRESSION: 1. No acute finding. 2. Advanced cerebral atrophy. Electronically Signed   By: Monte Fantasia M.D.   On: 03/06/2019 04:25   Ct Cerebral Perfusion W Contrast  Result Date: 03/05/2019 CLINICAL DATA:  85 year old female code stroke presentation with rightward gaze deviation, seizure like activity. EXAM: CT PERFUSION BRAIN TECHNIQUE: Multiphase CT imaging of the brain was performed following IV bolus contrast injection. Subsequent parametric perfusion maps were calculated using RAPID software. CONTRAST:  152mL OMNIPAQUE IOHEXOL 350 MG/ML SOLN COMPARISON:  Plain head CT today 2139 hours.  CTA head and  neck. FINDINGS: CT Brain Perfusion Findings: ASPECTS on noncontrast CT Head: 40 (age indeterminate right lentiform lacune) at 2139 hours today. CBF (<30%) Volume: None Perfusion (Tmax>6.0s) volume: None Mismatch Volume: Not applicable Infarction Location:Not applicable IMPRESSION: 1. No infarct core or ischemic penumbra detected by CT Perfusion. 2. CTA head and neck reported separately. Electronically Signed   By: Genevie Ann M.D.   On:  03/05/2019 22:35   Ct Head Code Stroke Wo Contrast  Result Date: 03/05/2019 CLINICAL DATA:  Code stroke. 85 year old female last seen normal at 0 800 hours. Rightward gaze deviation. EXAM: CT HEAD WITHOUT CONTRAST TECHNIQUE: Contiguous axial images were obtained from the base of the skull through the vertex without intravenous contrast. COMPARISON:  None. FINDINGS: Brain: Small dystrophic basal ganglia calcifications. Hypodense lacunar infarct tracking from the right corona radiata into the right lentiform. No associated hemorrhage or mass effect. Superimposed confluent bilateral cerebral white matter hypodensity. No midline shift, ventriculomegaly, mass effect, evidence of mass lesion, intracranial hemorrhage or evidence of cortically based acute infarction. Vascular: Calcified atherosclerosis at the skull base. No suspicious intracranial vascular hyperdensity. Skull: Osteopenia.  No acute osseous abnormality identified. Sinuses/Orbits: Trace paranasal sinus mucosal thickening. Other: Mild rightward gaze deviation. No other acute orbit or scalp soft tissue findings. ASPECTS [facility] Stroke Program Early CT Score) - Ganglionic level infarction (caudate, lentiform nuclei, internal capsule, insula, M1-M3 cortex): 6 - Supraganglionic infarction (M4-M6 cortex): 3 Total score (0-10 with 10 being normal): 9 IMPRESSION: 1. Age indeterminate lacunar infarct at the right corona radiata and lentiform. No associated hemorrhage or mass effect. ASPECTS 9. 2. Underlying bilateral white matter disease. No acute intracranial hemorrhage or acute cortically based infarct identified. These results were communicated to Dr. Lorraine Lax at 9:48 pm on 03/05/2019 by text page via the [facility] messaging system. Electronically Signed   By: Genevie Ann M.D.   On: 03/05/2019 21:49     03/06/2019: Transthoracic Echocardiogram IMPRESSIONS    1. Left ventricular ejection fraction, by visual estimation, is 60 to 65%. The left ventricle has normal  function. There is moderately increased left ventricular hypertrophy.  2. Elevated left atrial and left ventricular end-diastolic pressures.  3. Left ventricular diastolic Doppler parameters are consistent with pseudonormalization pattern of LV diastolic filling.  4. Global right ventricle has normal systolic function.The right ventricular size is normal. No increase in right ventricular wall thickness.  5. Left atrial size was normal.  6. Right atrial size was normal.  7. Mild mitral annular calcification.  8. The mitral valve is abnormal. Mild mitral valve regurgitation.  9. The tricuspid valve is grossly normal. Tricuspid valve regurgitation was not visualized by color flow Doppler. 10. The aortic valve is tricuspid Aortic valve regurgitation was not visualized by color flow Doppler. Mild to moderate aortic valve sclerosis/calcification without any evidence of aortic stenosis. 11. The pulmonic valve was grossly normal. Pulmonic valve regurgitation is not visualized by color flow Doppler. 12. The inferior vena cava is normal in size with greater than 50% respiratory variability, suggesting right atrial pressure of 3 mmHg.  FINDINGS  Left Ventricle: Left ventricular ejection fraction, by visual estimation, is 60 to 65%. The left ventricle has normal function. There is moderately increased left ventricular hypertrophy. The left ventricular diastology could not be evaluatedsecondary  to atrial fibrillation. Spectral Doppler shows Left ventricular diastolic Doppler parameters are consistent with pseudonormalization pattern of LV diastolic filling. Elevated left atrial and left ventricular end-diastolic pressures.  Right Ventricle: The right ventricular size is normal. No increase in  right ventricular wall thickness. Global RV systolic function is has normal systolic function.  Left Atrium: Left atrial size was normal in size.  Right Atrium: Right atrial size was normal in size  Pericardium:  There is no evidence of pericardial effusion.  Mitral Valve: The mitral valve is abnormal. There is mild thickening of the mitral valve leaflet(s). Mild mitral annular calcification. MV peak gradient, 6.7 mmHg. Mild mitral valve regurgitation.  Tricuspid Valve: The tricuspid valve is grossly normal. Tricuspid valve regurgitation was not visualized by color flow Doppler.  Aortic Valve: The aortic valve is tricuspid. Aortic valve regurgitation was not visualized by color flow Doppler. Mild to moderate aortic valve sclerosis/calcification is present, without any evidence of aortic stenosis. Aortic valve mean gradient  measures 3.8 mmHg. Aortic valve peak gradient measures 6.5 mmHg. Aortic valve area, by VTI measures 1.75 cm.  Pulmonic Valve: The pulmonic valve was grossly normal. Pulmonic valve regurgitation is not visualized by color flow Doppler.  Aorta: The aortic root and ascending aorta are structurally normal, with no evidence of dilitation.  Venous: The inferior vena cava is normal in size with greater than 50% respiratory variability, suggesting right atrial pressure of 3 mmHg.   Subjective: No issues overnight.  Discharge Exam: Vitals:   03/07/19 0439 03/07/19 0915  BP: (!) 153/50 (!) 160/60  Pulse: 64 66  Resp: 16 18  Temp: 98 F (36.7 C) 97.7 F (36.5 C)  SpO2: 99% 99%   Vitals:   03/06/19 1545 03/06/19 1643 03/07/19 0439 03/07/19 0915  BP: (!) 155/63 (!) 156/74 (!) 153/50 (!) 160/60  Pulse: 65 63 64 66  Resp: 19 18 16 18   Temp:  98.5 F (36.9 C) 98 F (36.7 C) 97.7 F (36.5 C)  TempSrc:  Oral Oral Oral  SpO2: 100% 100% 99% 99%  Weight:   55.8 kg   Height:        General: Pt is alert, awake, not in acute distress Cardiovascular: RRR, S1/S2 +, no rubs, no gallops Respiratory: CTA bilaterally, no wheezing, no rhonchi Abdominal: Soft, NT, ND, bowel sounds + Extremities: no edema, no cyanosis    The results of significant diagnostics from this  hospitalization (including imaging, microbiology, ancillary and laboratory) are listed below for reference.     Microbiology: Recent Results (from the past 240 hour(s))  SARS CORONAVIRUS 2 (TAT 6-24 HRS) Nasopharyngeal Nasopharyngeal Swab     Status: None   Collection Time: 03/05/19 10:56 PM   Specimen: Nasopharyngeal Swab  Result Value Ref Range Status   SARS Coronavirus 2 NEGATIVE NEGATIVE Final    Comment: (NOTE) SARS-CoV-2 target nucleic acids are NOT DETECTED. The SARS-CoV-2 RNA is generally detectable in upper and lower respiratory specimens during the acute phase of infection. Negative results do not preclude SARS-CoV-2 infection, do not rule out co-infections with other pathogens, and should not be used as the sole basis for treatment or other patient management decisions. Negative results must be combined with clinical observations, patient history, and epidemiological information. The expected result is Negative. Fact Sheet for Patients: SugarRoll.be Fact Sheet for Healthcare Providers: https://www.woods-mathews.com/ This test is not yet approved or cleared by the [place] FDA and  has been authorized for detection and/or diagnosis of SARS-CoV-2 by FDA under an Emergency Use Authorization (EUA). This EUA will remain  in effect (meaning this test can be used) for the duration of the COVID-19 declaration under Section 56 4(b)(1) of the Act, 21 U.S.C. section 360bbb-3(b)(1), unless the authorization is terminated or revoked  sooner. Performed at [facility], [place] [address] [place], [place] [address]      Labs: BNP (last 3 results) No results for input(s): BNP in the last 8760 hours. Basic Metabolic Panel: Recent Labs  Lab 03/05/19 2132 03/05/19 2150 03/06/19 0312  NA 138 138 138  K 5.4* 5.0 4.3  CL 107 103 105  CO2  --  20* 20*  GLUCOSE 194* 204* 166*  BUN 38* 32* 29*  CREATININE 1.00 1.22* 1.25*  CALCIUM   --  9.6 8.8*   Liver Function Tests: Recent Labs  Lab 03/05/19 2150 03/06/19 0312  AST 21 19  ALT 19 17  ALKPHOS 111 91  BILITOT 1.3* 0.8  PROT 6.9 6.0*  ALBUMIN 3.8 3.1*   No results for input(s): LIPASE, AMYLASE in the last 168 hours. No results for input(s): AMMONIA in the last 168 hours. CBC: Recent Labs  Lab 03/05/19 2132 03/05/19 2150 03/06/19 0312  WBC  --  14.9* 12.8*  NEUTROABS  --  12.9*  --   HGB 13.3 14.2 11.7*  HCT 39.0 40.9 35.9*  MCV  --  93.2 95.0  PLT  --  221 178   Cardiac Enzymes: No results for input(s): CKTOTAL, CKMB, CKMBINDEX, TROPONINI in the last 168 hours. BNP: Invalid input(s): POCBNP CBG: Recent Labs  Lab 03/06/19 1225 03/06/19 1648 03/06/19 2048 03/07/19 0700 03/07/19 1108  GLUCAP 115* 180* 79 105* 193*   D-Dimer No results for input(s): DDIMER in the last 72 hours. Hgb A1c No results for input(s): HGBA1C in the last 72 hours. Lipid Profile No results for input(s): CHOL, HDL, LDLCALC, TRIG, CHOLHDL, LDLDIRECT in the last 72 hours. Thyroid function studies No results for input(s): TSH, T4TOTAL, T3FREE, THYROIDAB in the last 72 hours.  Invalid input(s): FREET3 Anemia work up No results for input(s): VITAMINB12, FOLATE, FERRITIN, TIBC, IRON, RETICCTPCT in the last 72 hours. Urinalysis    Component Value Date/Time   COLORURINE YELLOW 03/06/2019 0050   APPEARANCEUR CLEAR 03/06/2019 0050   LABSPEC >1.046 (H) 03/06/2019 0050   PHURINE 5.0 03/06/2019 0050   GLUCOSEU 50 (A) 03/06/2019 0050   HGBUR SMALL (A) 03/06/2019 0050   HGBUR large 06/08/2009 1435   BILIRUBINUR NEGATIVE 03/06/2019 0050   KETONESUR 20 (A) 03/06/2019 0050   PROTEINUR 30 (A) 03/06/2019 0050   UROBILINOGEN 0.2 03/08/2011 2011   NITRITE NEGATIVE 03/06/2019 0050   LEUKOCYTESUR NEGATIVE 03/06/2019 0050   Sepsis Labs Invalid input(s): PROCALCITONIN,  WBC,  LACTICIDVEN Microbiology Recent Results (from the past 240 hour(s))  SARS CORONAVIRUS 2 (TAT 6-24 HRS)  Nasopharyngeal Nasopharyngeal Swab     Status: None   Collection Time: 03/05/19 10:56 PM   Specimen: Nasopharyngeal Swab  Result Value Ref Range Status   SARS Coronavirus 2 NEGATIVE NEGATIVE Final    Comment: (NOTE) SARS-CoV-2 target nucleic acids are NOT DETECTED. The SARS-CoV-2 RNA is generally detectable in upper and lower respiratory specimens during the acute phase of infection. Negative results do not preclude SARS-CoV-2 infection, do not rule out co-infections with other pathogens, and should not be used as the sole basis for treatment or other patient management decisions. Negative results must be combined with clinical observations, patient history, and epidemiological information. The expected result is Negative. Fact Sheet for Patients: SugarRoll.be Fact Sheet for Healthcare Providers: https://www.woods-mathews.com/ This test is not yet approved or cleared by the [place] FDA and  has been authorized for detection and/or diagnosis of SARS-CoV-2 by FDA under an Emergency Use Authorization (EUA).  This EUA will remain  in effect (meaning this test can be used) for the duration of the COVID-19 declaration under Section 56 4(b)(1) of the Act, 21 U.S.C. section 360bbb-3(b)(1), unless the authorization is terminated or revoked sooner. Performed at [facility], [place] [address] [place], [place] [address]      Time coordinating discharge: 35 minutes  SIGNED:   Cordelia Poche, MD [facility] 03/07/2019, 1:39 PM

## 2019-03-07 NOTE — TOC Transition Note (Signed)
Transition of Care [facility]) - CM/SW Discharge Note   Patient Details  Name: Taylor Castaneda MRN: [id number] Date of Birth: 1932/07/08  [facility]) CM/SW Contact:  Carles Collet, RN Phone Number: 03/07/2019, 2:41 PM   Clinical Narrative:   Spoke to patient's daughter at bedside. They would like outpatient PT at [facility] off [address]. Referral placed through Epic    Final next level of care: Home/Self Care Barriers to Discharge: No Barriers Identified   Patient Goals and CMS Choice        Discharge Placement                       Discharge Plan and Services                                     Social Determinants of Health (SDOH) Interventions     Readmission Risk Interventions No flowsheet data found.

## 2019-03-07 NOTE — Discharge Instructions (Addendum)
Taylor Castaneda,  You were in the hospital because of a seizure. You were seen by a neurologist and started on seizure medications. Please follow-up with your primary care physician.  Per [facility] statutes, patients with seizures are not allowed to drive until  they have been seizure-free for six months. Use caution when using heavy equipment or power tools. Avoid working on ladders or at heights. Take showers instead of baths. Ensure the water temperature is not too high on the home water heater. Do not go swimming alone. When caring for infants or small children, sit down when holding, feeding, or changing them to minimize risk of injury to the child in the event you have a seizure.    Also, Maintain good sleep hygiene. Avoid alcohol.   --> Call 911 and bring the patient back to the ED if:               A.  The seizure lasts longer than 5 minutes.                  B.  The patient doesn't awaken shortly after the seizure             C.  The patient has new problems such as difficulty seeing, speaking or moving             D.  The patient was injured during the seizure             E.  The patient has a temperature over 102 F (39C)             F.  The patient vomited and now is having trouble breathing

## 2019-03-09 ENCOUNTER — Telehealth: Payer: Self-pay

## 2019-03-09 NOTE — Telephone Encounter (Signed)
Left message for patient to call back to complete TCM call. Patient already had an appointment for hospital follow up with DR G on 03/18/2019

## 2019-03-10 ENCOUNTER — Ambulatory Visit: Payer: Medicare Other | Admitting: Diagnostic Neuroimaging

## 2019-03-10 ENCOUNTER — Encounter: Payer: Self-pay | Admitting: Diagnostic Neuroimaging

## 2019-03-10 ENCOUNTER — Other Ambulatory Visit: Payer: Self-pay

## 2019-03-10 VITALS — BP 157/64 | HR 68 | Temp 96.6°F | Ht 60.0 in | Wt 123.6 lb

## 2019-03-10 DIAGNOSIS — R569 Unspecified convulsions: Secondary | ICD-10-CM | POA: Diagnosis not present

## 2019-03-10 DIAGNOSIS — R413 Other amnesia: Secondary | ICD-10-CM | POA: Diagnosis not present

## 2019-03-10 MED ORDER — LEVETIRACETAM 250 MG PO TABS: 250.0000 mg | ORAL_TABLET | Freq: Two times a day (BID) | ORAL | 4 refills | Status: DC

## 2019-03-10 NOTE — Patient Instructions (Addendum)
NEW ONSET SEIZURE - continue levetiracetam 250mg  twice a day  - According to [place] law, you can not drive unless you are seizure / syncope free for at least 6 months and under physician's care.  - Please maintain precautions. Do not participate in activities where a loss of awareness could harm you or someone else. No swimming alone, no tub bathing, no hot tubs, no driving, no operating motorized vehicles (cars, ATVs, motocycles, etc), lawnmowers, power tools or firearms. No standing at heights, such as rooftops, ladders or stairs. Avoid hot objects such as stoves, heaters, open fires. Wear a helmet when riding a bicycle, scooter, skateboard, etc. and avoid areas of traffic. Set your water heater to 120 degrees or less.    MEMORY LOSS (? mild dementia) - safety / supervision issues reviewed - caregiver resources provided - no driving; caution with finances

## 2019-03-10 NOTE — Telephone Encounter (Signed)
Please call daughter [name] back to go over TCM. She said pt is staying with her.   CB  [phone] Cell # [phone] if you can't reach on cb #.

## 2019-03-10 NOTE — Telephone Encounter (Signed)
LM requesting call back to complete TCM and confirm hospital follow up.   

## 2019-03-10 NOTE — Progress Notes (Signed)
[facility]  PATIENT: Taylor Castaneda DOB: [birth year]  REFERRING CLINICIAN: hospital HISTORY FROM: patient and daughter  REASON FOR VISIT: new consult    HISTORICAL  CHIEF COMPLAINT:  Chief Complaint  Patient presents with  . Seizures    rm 7 New Pt, hospital referral/FU, dgtr- [name], "work up for stroke was negative"    HISTORY OF PRESENT ILLNESS:   85 year old female here for evaluation of seizure.  Patient was found unresponsive at home on 03/05/2019 at 8:00 in the evening by her family who was trying to reach her by phone.  Neighbor noted she was acting erratically around 4 PM.  She was found on the kitchen floor with eye movements, shaking and convulsions.  Blood sugar at home was 202.  Patient was taken to the hospital for evaluation.  Patient had right gaze preference and left neglect.  Code stroke was activated.  Patient was diagnosed with status epilepticus and treated with antiseizure medications.  MRI of the brain showed no acute findings.  Of note patient has had some mild memory problems in the last few months.  She was living alone.  She was able to drive, take care of ADLs.  Now patient living with daughter.  She is not driving.  She is tolerating levetiracetam 250 mg twice a day.   REVIEW OF SYSTEMS: Full 14 system review of systems performed and negative with exception of: As per HPI.  ALLERGIES: Allergies  Allergen Reactions  . Pravastatin Sodium     REACTION: anemia as per Dr Gilford Rile, [facility] MEDICATIONS: Outpatient Medications Prior to Visit  Medication Sig Dispense Refill  . aspirin 81 MG EC tablet Take 1 tablet (81 mg total) by mouth daily. Swallow whole. 30 tablet 0  . atorvastatin (LIPITOR) 20 MG tablet Take 1 tablet (20 mg total) by mouth daily at 6 PM. 90 tablet 3  . B-D ULTRAFINE III SHORT PEN 31G X 8 MM MISC USE AS DIRECTED WITH INSULIN 100 each 3  . Carboxymethylcellul-Glycerin (CLEAR EYES FOR DRY EYES) 1-0.25 % SOLN  Place 1-2 drops into both eyes 3 (three) times daily as needed (for dry/irritated eyes.).    Marland Kitchen carvedilol (COREG) 25 MG tablet Take 1 tablet (25 mg total) by mouth 2 (two) times daily. 180 tablet 3  . Cholecalciferol (VITAMIN D3 PO) Take 2,500 Units by mouth daily.    . colchicine 0.6 MG tablet Take 1 tablet (0.6 mg total) by mouth daily as needed (gout flare). First day of gout flare, may take 1 tablet twice daily. 30 tablet 1  . furosemide (LASIX) 40 MG tablet Take 0.5 tablets (20 mg total) by mouth daily. 45 tablet 3  . glucose blood (FREESTYLE LITE) test strip Use as instructed (Patient taking differently: 1 each by Other route 4 (four) times daily. Use as instructed) 100 each 2  . HUMALOG KWIKPEN 100 UNIT/ML KiwkPen Inject 1-10 Units into the skin 3 (three) times daily before meals. Sliding scale  4  . LANTUS SOLOSTAR 100 UNIT/ML Solostar Pen Inject 30 Units into the skin at bedtime.    . levETIRAcetam (KEPPRA) 250 MG tablet Take 1 tablet (250 mg total) by mouth 2 (two) times daily. 60 tablet 0  . potassium chloride (K-DUR) 10 MEQ tablet TAKE 1  BY MOUTH ON MONDAY, [facility] AND FRIDAY (Patient taking differently: Take 10 mEq by mouth every Monday, Wednesday, and Friday. ) 36 tablet 3  . ramipril (ALTACE) 10 MG capsule Take 1 capsule (  10 mg total) by mouth daily. 90 capsule 3   No facility-administered medications prior to visit.     PAST MEDICAL HISTORY: Past Medical History:  Diagnosis Date  . Anemia   . Diabetes mellitus, type II ([place])   . Glaucoma    Dr.Hecker  . History of colon cancer 1998   Dr. Lennie Hummer  . Hyperlipemia   . Osteopenia 02/2012, 03/2014   DEXA hip -2.2  . Renal insufficiency   . Seizures ([place])   . Stenosing tenosynovitis of finger of left hand 2016   index - s/p steroid injection x2    PAST SURGICAL HISTORY: Past Surgical History:  Procedure Laterality Date  . ABI  11/2013   WNL  . APPENDECTOMY    . CARPAL TUNNEL RELEASE     Bilateral   . CATARACT  EXTRACTION Bilateral 2006   Implants  . COLECTOMY  1998   For cancer  . COLONOSCOPY  2008   Dr.Stark, Due 2011  . COLONOSCOPY  06/2013   1 hyperplastic polyp, ileocecal anastomosis [facility])  . dexa  02/2012, 03/2014   T score -2.2 at hip overall stable  . REFRACTIVE SURGERY    . TONSILLECTOMY AND ADENOIDECTOMY    . TOTAL KNEE ARTHROPLASTY Left 12/17/2016   Procedure: LEFT TOTAL KNEE ARTHROPLASTY;  Surgeon: Gaynelle Arabian, MD;  Location: [facility];  Service: Orthopedics;  Laterality: Left;    FAMILY HISTORY: Family History  Problem Relation Age of Onset  . Heart attack Father 8  . Stroke Father 55  . Diabetes Father   . Hypertension Father   . Heart disease Father        before age 71  . Breast cancer Mother   . Cancer Mother   . Diabetes Sister   . Cancer Sister   . Peripheral vascular disease Sister   . Breast cancer Sister   . Hypertension Son   . COPD Neg Hx   . Asthma Neg Hx   . Colon cancer Neg Hx   . Esophageal cancer Neg Hx   . Rectal cancer Neg Hx   . Stomach cancer Neg Hx     SOCIAL HISTORY: Social History   Socioeconomic History  . Marital status: Widowed    Spouse name: Not on file  . Number of children: 4  . Years of education: Not on file  . Highest education level: High school graduate  Occupational History  . Occupation: retired    Fish farm manager: Retired    Comment: Scientist, research (physical sciences)  . Financial resource strain: Not hard at all  . Food insecurity    Worry: Never true    Inability: Never true  . Transportation needs    Medical: No    Non-medical: No  Tobacco Use  . Smoking status: Former Smoker    Years: 2.00    Types: Cigarettes    Quit date: 06/04/1973    Years since quitting: 45.7  . Smokeless tobacco: Never Used  . Tobacco comment: Smoked for 5 years 1965-1970 up to 1/2 pp week  Substance and Sexual Activity  . Alcohol use: Not Currently  . Drug use: No  . Sexual activity: Never  Lifestyle  . Physical activity    Days per week: 3  days    Minutes per session: 60 min  . Stress: Not at all  Relationships  . Social Herbalist on phone: Not on file    Gets together: Not on file  Attends religious service: Not on file    Active member of club or organization: Not on file    Attends meetings of clubs or organizations: Not on file    Relationship status: Not on file  . Intimate partner violence    Fear of current or ex partner: No    Emotionally abused: No    Physically abused: No    Forced sexual activity: No  Other Topics Concern  . Not on file  Social History Narrative   03/10/19 Lives alone, no pets   Neighbors keep an eye on each other - 12 unit condo.    Family nearby.   Occ: retired Regulatory affairs officer   Activity: walking   Diet: good water, fruits/vegetables daily   Caffeine- some tea, occas soda     PHYSICAL EXAM  GENERAL EXAM/CONSTITUTIONAL: Vitals:  Vitals:   03/10/19 1358  BP: (!) 157/64  Pulse: 68  Temp: (!) 96.6 F (35.9 C)  Weight: 123 lb 9.6 oz (56.1 kg)  Height: 5' (1.524 m)     Body mass index is 24.14 kg/m. Wt Readings from Last 3 Encounters:  03/10/19 123 lb 9.6 oz (56.1 kg)  03/07/19 123 lb (55.8 kg)  03/03/19 122 lb 7 oz (55.5 kg)     Patient is in no distress; well developed, nourished and groomed; neck is supple  CARDIOVASCULAR:  Examination of carotid arteries is normal; no carotid bruits  Regular rate and rhythm, no murmurs  Examination of peripheral vascular system by observation and palpation is normal  EYES:  Ophthalmoscopic exam of optic discs and posterior segments is normal; no papilledema or hemorrhages  No exam data present  MUSCULOSKELETAL:  Gait, strength, tone, movements noted in Neurologic exam below  NEUROLOGIC: MENTAL STATUS:  MMSE - [place] Exam 03/10/2019 02/26/2019 02/18/2018  Orientation to time 4 5 5   Orientation to Place 4 5 5   Registration 3 3 3   Attention/ Calculation 1 5 0  Recall 2 3 3   Language- name 2 objects  2 - 0  Language- repeat 1 1 1   Language- follow 3 step command 3 - 3  Language- read & follow direction 1 - 0  Write a sentence 1 - 0  Copy design 0 - 0  Total score 22 - 20    awake, alert, oriented to person  [facility] memory   DECR attention and concentration  language fluent, comprehension intact, naming intact  fund of knowledge appropriate  CRANIAL NERVE:   2nd - no papilledema on fundoscopic exam  2nd, 3rd, 4th, 6th - pupils equal and reactive to light, visual fields full to confrontation, extraocular muscles intact, no nystagmus  5th - facial sensation symmetric  7th - facial strength symmetric  8th - hearing intact  9th - palate elevates symmetrically, uvula midline  11th - shoulder shrug symmetric  12th - tongue protrusion midline  MOTOR:   normal bulk and tone, full strength in the BUE, BLE  SENSORY:   normal and symmetric to light touch, temperature, vibration  COORDINATION:   finger-nose-finger, fine finger movements normal  REFLEXES:   deep tendon reflexes TRACE and symmetric  GAIT/STATION:   narrow based gait; USING WALKER     DIAGNOSTIC DATA (LABS, IMAGING, TESTING) - I reviewed patient records, labs, notes, testing and imaging myself where available.  Lab Results  Component Value Date   WBC 12.8 (H) 03/06/2019   HGB 11.7 (L) 03/06/2019   HCT 35.9 (L) 03/06/2019   MCV 95.0 03/06/2019  PLT 178 03/06/2019      Component Value Date/Time   NA 138 03/06/2019 0312   K 4.3 03/06/2019 0312   CL 105 03/06/2019 0312   CO2 20 (L) 03/06/2019 0312   GLUCOSE 166 (H) 03/06/2019 0312   BUN 29 (H) 03/06/2019 0312   CREATININE 1.25 (H) 03/06/2019 0312   CALCIUM 8.8 (L) 03/06/2019 0312   PROT 6.0 (L) 03/06/2019 0312   ALBUMIN 3.1 (L) 03/06/2019 0312   AST 19 03/06/2019 0312   ALT 17 03/06/2019 0312   ALKPHOS 91 03/06/2019 0312   BILITOT 0.8 03/06/2019 0312   GFRNONAA 39 (L) 03/06/2019 0312   GFRAA 46 (L) 03/06/2019 0312   Lab Results   Component Value Date   CHOL 275 (H) 02/26/2019   HDL 39.80 02/26/2019   LDLCALC 206 (H) 02/26/2019   LDLDIRECT 103.0 02/18/2018   TRIG 149.0 02/26/2019   CHOLHDL 7 02/26/2019   Lab Results  Component Value Date   HGBA1C 7.4 (H) 02/26/2019   Lab Results  Component Value Date   VITAMINB12 >1500 (H) 03/05/2011   Lab Results  Component Value Date   TSH 2.12 01/28/2019    03/06/19 EEG  - This study is suggestive of moderate to severe diffuse encephalopathy, likely secondary to sedated state.  No seizures or epileptiform discharges were seen throughout the recording.  03/06/19 MRI brain [I reviewed images myself and agree with interpretation. Moderate-severe atrophy. Mild chronic small vessel ischemic disease. -VRP]  1. No acute finding. 2. Advanced cerebral atrophy.   ASSESSMENT AND PLAN  85 y.o. year old female here with:  Dx:  1. Seizures ([place])   2. Memory loss      PLAN:  NEW ONSET SEIZURE (? due to mild dementia) - continue levetiracetam 250mg  twice a day  - According to [place] law, you can not drive unless you are seizure / syncope free for at least 6 months and under physician's care.  - Please maintain precautions. Do not participate in activities where a loss of awareness could harm you or someone else. No swimming alone, no tub bathing, no hot tubs, no driving, no operating motorized vehicles (cars, ATVs, motocycles, etc), lawnmowers, power tools or firearms. No standing at heights, such as rooftops, ladders or stairs. Avoid hot objects such as stoves, heaters, open fires. Wear a helmet when riding a bicycle, scooter, skateboard, etc. and avoid areas of traffic. Set your water heater to 120 degrees or less.   MEMORY LOSS (? MCI vs mild dementia) - safety / supervision issues reviewed - caregiver resources provided - no driving; caution with finances  Meds ordered this encounter  Medications  . levETIRAcetam (KEPPRA) 250 MG tablet    Sig: Take 1 tablet (250 mg  total) by mouth 2 (two) times daily.    Dispense:  180 tablet    Refill:  4   Return in about 6 months (around 09/08/2019) for with NP (Amy Lomax).    Penni Bombard, MD 25/0/0370, 4:88 PM Certified in Neurology, Neurophysiology and Neuroimaging  [facility] [address], [place], [place] [address] [phone]

## 2019-03-11 NOTE — Telephone Encounter (Signed)
Transition Care Management Follow-up Telephone Call Spoke with [name], patient's daughter, ok per DPR on file.   Date discharged? 03/07/2019   How have you been since you were released from the hospital? Spoke with [name], patient's daughter, ok per DPR on file. Patient is doing pretty good. Patient is using a walker to ambulate. No more issues with seizure-tolerating new medication well at this time. Patient has slept a lot more today. Patient is eating and drinking ok.   Do you understand why you were in the hospital? Yes somewhat per daughter.   Do you understand the discharge instructions? Yes somewhat per daughter.   Where were you discharged to? Home with her daughter and her husband   Items Reviewed:  Medications reviewed: yes  Allergies reviewed: yes  Dietary changes reviewed: yes  Referrals reviewed: PT is not set up yet for the patient-daughter stating they are working on getting this arranged.   Functional Questionnaire:   Activities of Daily Living (ADLs):   She states they are independent in the following: dressing, bathing, toileting, hygiene States they require assistance with the following: ambulation with a walker, daughter fixes medication and food at this time.   Any transportation issues/concerns?:  no   Any patient concerns? [name] states neurologist yesterday told them that patient may have start of mild dementia and that she should not be left alone. [name] does not feel like patient is that bad, they are aware she should not drive for at least 6 months. [name] just wanted to get some help, some insight on how to help patient stay independent to a certain degree. Patient lives with her and her husband right now but before that her home is a condominium and patient wants to be able to go back home. [name] wants help if possible on how to assist patient with all of this.  [name] will come with patient for her follow up next week to be able to go over all of this with  Dr. Darnell Level.   Confirmed importance and date/time of follow-up visits scheduled yes  Provider Appointment booked with Dr Darnell Level 03/18/2019  Confirmed with patient if condition begins to worsen call PCP or go to the ER.  Patient was given the office number and encouraged to call back with question or concerns.  : Yes

## 2019-03-12 ENCOUNTER — Encounter: Payer: Self-pay | Admitting: Diagnostic Neuroimaging

## 2019-03-13 ENCOUNTER — Telehealth: Payer: Self-pay | Admitting: Family Medicine

## 2019-03-13 NOTE — Telephone Encounter (Signed)
Patient's Daughter [name] called to check status of faxed order that needed to be signed to start physical therapy with the patient.

## 2019-03-13 NOTE — Telephone Encounter (Signed)
Filled and in [name]'s box.  Discharged from [facility] - rec outpatient PT - ordered.

## 2019-03-16 NOTE — Telephone Encounter (Signed)
Taylor Castaneda advised that papers were faxed on 03/13/2019

## 2019-03-18 ENCOUNTER — Encounter: Payer: Self-pay | Admitting: Family Medicine

## 2019-03-18 ENCOUNTER — Other Ambulatory Visit: Payer: Self-pay

## 2019-03-18 ENCOUNTER — Ambulatory Visit (INDEPENDENT_AMBULATORY_CARE_PROVIDER_SITE_OTHER): Payer: Medicare Other | Admitting: Family Medicine

## 2019-03-18 VITALS — BP 148/68 | HR 71 | Temp 97.6°F | Ht 60.0 in | Wt 119.3 lb

## 2019-03-18 DIAGNOSIS — E785 Hyperlipidemia, unspecified: Secondary | ICD-10-CM

## 2019-03-18 DIAGNOSIS — I4891 Unspecified atrial fibrillation: Secondary | ICD-10-CM | POA: Diagnosis not present

## 2019-03-18 DIAGNOSIS — I10 Essential (primary) hypertension: Secondary | ICD-10-CM | POA: Diagnosis not present

## 2019-03-18 DIAGNOSIS — G3184 Mild cognitive impairment, so stated: Secondary | ICD-10-CM

## 2019-03-18 DIAGNOSIS — R569 Unspecified convulsions: Secondary | ICD-10-CM

## 2019-03-18 MED ORDER — ATORVASTATIN CALCIUM 40 MG PO TABS: 40.0000 mg | ORAL_TABLET | Freq: Every day | ORAL | 0 refills | Status: DC

## 2019-03-18 MED ORDER — VITAMIN D3 25 MCG (1000 UT) PO CAPS: 1.0000 | ORAL_CAPSULE | Freq: Every day | ORAL | Status: DC

## 2019-03-18 NOTE — Patient Instructions (Signed)
Check EKG today for upcoming heart doctor appointment.  Good to see you today. Medicine list updated.  Continue atorvastatin 40mg  daily.

## 2019-03-18 NOTE — Progress Notes (Signed)
This visit was conducted in person.  BP (!) 148/68 (BP Location: Right Arm, Cuff Size: Normal)   Pulse 71   Temp 97.6 F (36.4 C) (Temporal)   Ht 5' (1.524 m)   Wt 119 lb 5 oz (54.1 kg)   SpO2 98%   BMI 23.30 kg/m    CC: hosp f/u visit Subjective:    Patient ID: Taylor Castaneda, female    DOB: 1933-01-24, 85 y.o.   MRN: [id number]  HPI: Taylor Castaneda is a 85 y.o. female presenting on 03/18/2019 for Hospitalization Follow-up (Wants to discuss some meds/vitamins.  Pt accompanied by daughter, [name]. )   Here with daughter [name].   Recent hospitalization for new onset seizures that started 03/05/2019. Records reviewed. MRI brain did not show CVA but did show advanced cerebral atrophy. EEG showed diffuse encephalopathy without obvious seizure activity. TTE unrevealing. Started on keppra 250mg  bid. She did have transient episode of atrial fibrillation, anticoagulation was not started. Discharged on aspirin 81mg  daily - she states she has been taking this for years (but was not on her med list prior). Upcoming cardiology appointment. Saw neurology in follow up, rec no driving for 6 months. Tolerating keppra well with increased sleepiness noted as only possible side effect. No mood changes.   No UTI symptoms, diarrhea, cellulitis, or cough.   Tolerating higher ramipril dose without dizziness, lightheadedness. No HA, vision changes, CP/tightness, SOB, leg swelling. Has not been checking BP at home.   Admit date: 03/05/2019 Discharge date: 03/07/2019 TCM hospital f/u phone call performed 03/09/2019  Admitted From: Home Disposition: Home  Recommendations for Outpatient Follow-up:  1. Follow up with PCP in 1 week 2. Please obtain BMP/CBC in one week 3. Please follow up on the following pending results: None  Home Health: Outpatient PT Equipment/Devices: None (patient has a RW at home)  Discharge Condition: Stable CODE STATUS: Full code Diet recommendation: Heart healthy   Discharge  Diagnoses:  Principal Problem:   Status epilepticus ([place]) Active Problems:   Controlled type 2 diabetes mellitus with diabetic nephropathy, with long-term current use of insulin (HCC)   HLD (hyperlipidemia)   Essential hypertension   CKD stage 3 due to type 2 diabetes mellitus (HCC)   OA (osteoarthritis) of knee   Altered mental status     Relevant past medical, surgical, family and social history reviewed and updated as indicated. Interim medical history since our last visit reviewed. Allergies and medications reviewed and updated. Outpatient Medications Prior to Visit  Medication Sig Dispense Refill  . aspirin 81 MG EC tablet Take 1 tablet (81 mg total) by mouth daily. Swallow whole. 30 tablet 0  . B-D ULTRAFINE III SHORT PEN 31G X 8 MM MISC USE AS DIRECTED WITH INSULIN 100 each 3  . Biotin 10000 MCG TABS Take 1 tablet by mouth daily.    . Carboxymethylcellul-Glycerin (CLEAR EYES FOR DRY EYES) 1-0.25 % SOLN Place 1-2 drops into both eyes 3 (three) times daily as needed (for dry/irritated eyes.).    Marland Kitchen carvedilol (COREG) 25 MG tablet Take 1 tablet (25 mg total) by mouth 2 (two) times daily. 180 tablet 3  . Cinnamon 500 MG capsule Take 500 mg by mouth daily.    . colchicine 0.6 MG tablet Take 1 tablet (0.6 mg total) by mouth daily as needed (gout flare). First day of gout flare, may take 1 tablet twice daily. 30 tablet 1  . Ferrous Sulfate (IRON) 325 (65 Fe) MG TABS Take 1 tablet by  mouth 2 (two) times a week. Takes Mon and [employer]    . furosemide (LASIX) 40 MG tablet Take 0.5 tablets (20 mg total) by mouth daily. 45 tablet 3  . glucose blood (FREESTYLE LITE) test strip Use as instructed (Patient taking differently: 1 each by Other route 4 (four) times daily. Use as instructed) 100 each 2  . HUMALOG KWIKPEN 100 UNIT/ML KiwkPen Inject 1-10 Units into the skin 3 (three) times daily before meals. Sliding scale  4  . LANTUS SOLOSTAR 100 UNIT/ML Solostar Pen Inject 30 Units into the skin at  bedtime.    . levETIRAcetam (KEPPRA) 250 MG tablet Take 1 tablet (250 mg total) by mouth 2 (two) times daily. 180 tablet 4  . Multiple Vitamins-Minerals (PRESERVISION AREDS 2 PO) Take by mouth 2 (two) times daily.    . potassium chloride (K-DUR) 10 MEQ tablet TAKE 1  BY MOUTH ON MONDAY, [facility] AND FRIDAY (Patient taking differently: Take 10 mEq by mouth every Monday, Wednesday, and Friday. ) 36 tablet 3  . ramipril (ALTACE) 10 MG capsule Take 1 capsule (10 mg total) by mouth daily. 90 capsule 3  . atorvastatin (LIPITOR) 20 MG tablet Take 1 tablet (20 mg total) by mouth daily at 6 PM. 90 tablet 3  . Cholecalciferol (VITAMIN D3 PO) Take 2,500 Units by mouth daily.    Marland Kitchen VITAMIN E PO Take by mouth.     No facility-administered medications prior to visit.      Per HPI unless specifically indicated in ROS section below Review of Systems Objective:    BP (!) 148/68 (BP Location: Right Arm, Cuff Size: Normal)   Pulse 71   Temp 97.6 F (36.4 C) (Temporal)   Ht 5' (1.524 m)   Wt 119 lb 5 oz (54.1 kg)   SpO2 98%   BMI 23.30 kg/m   Wt Readings from Last 3 Encounters:  03/18/19 119 lb 5 oz (54.1 kg)  03/10/19 123 lb 9.6 oz (56.1 kg)  03/07/19 123 lb (55.8 kg)    Physical Exam Vitals signs and nursing note reviewed.  Constitutional:      General: She is not in acute distress.    Appearance: Normal appearance. She is not ill-appearing.     Comments: Ambulates with cane  HENT:     Mouth/Throat:     Mouth: Mucous membranes are moist.     Pharynx: Oropharynx is clear. No posterior oropharyngeal erythema.  Eyes:     Extraocular Movements: Extraocular movements intact.     Pupils: Pupils are equal, round, and reactive to light.  Cardiovascular:     Rate and Rhythm: Normal rate and regular rhythm.     Pulses: Normal pulses.     Heart sounds: Normal heart sounds. No murmur.  Pulmonary:     Effort: Pulmonary effort is normal. No respiratory distress.     Breath sounds: Normal breath  sounds. No wheezing, rhonchi or rales.  Musculoskeletal:     Right lower leg: No edema.     Left lower leg: No edema.     Comments: CN grossly intact. Grip strength intact  Neurological:     Mental Status: She is alert.  Psychiatric:        Mood and Affect: Mood normal.        Behavior: Behavior normal.       Lab Results  Component Value Date   CHOL 275 (H) 02/26/2019   HDL 39.80 02/26/2019   LDLCALC 206 (H) 02/26/2019   LDLDIRECT  103.0 02/18/2018   TRIG 149.0 02/26/2019   CHOLHDL 7 02/26/2019    EKG - NSR rate 60, normal axis, intervals, no acute ST/T changes.  Assessment & Plan:   Problem List Items Addressed This Visit    Transient atrial fibrillation ([place])    New episodes of afib noted on EKG in hospital. Discussed benefits/risks of anticoagulation. She is already on aspirin 81mg  daily. She has cardiology virtual appointment next week. Sounds regular today - will update EKG today.       Relevant Medications   atorvastatin (LIPITOR) 40 MG tablet   Other Relevant Orders   EKG 12-Lead (Completed)   Seizure ([place]) - Primary    New onset seizure, presented to ER in status epilepticus, workup largely unrevealing. ?hypoglycemia related in diabetic with insulin use. Reinforced need to avoid driving for 6 months from last seizure. She is now on low dose keppra and tolerating well besides mild sedation noted. Appreciate neuro care.       MCI (mild cognitive impairment)    New diagnosis by neurology. She has not had significant memory impairment previously and had lived independently in her [place]. Now living with daughter for the forseeable future, discussed how prolonged seizure could negatively impact cognition. Her goal is to return to her condo. Encouraged they look into life alert system. Will continue to monitor.       HLD (hyperlipidemia)    Again reviewed with patient and daughter. Confusion last visit if she was taking atorvastatin. She was actually prescribed 40mg  dose by  endo - advisdd continue this dose, med list updated.       Relevant Medications   atorvastatin (LIPITOR) 40 MG tablet   Essential hypertension    Chronic, improving readings. Continue current regimen.       Relevant Medications   atorvastatin (LIPITOR) 40 MG tablet       Meds ordered this encounter  Medications  . atorvastatin (LIPITOR) 40 MG tablet    Sig: Take 1 tablet (40 mg total) by mouth daily at 6 PM.    Dispense:  1 tablet    Refill:  0    To update pt's med list - should be on 40mg  daily - has enough at home  . Cholecalciferol (VITAMIN D3) 25 MCG (1000 UT) CAPS    Sig: Take 1 capsule (1,000 Units total) by mouth daily.    Dispense:  30 capsule   Orders Placed This Encounter  Procedures  . EKG 12-Lead    Patient Instructions  Check EKG today for upcoming heart doctor appointment.  Good to see you today. Medicine list updated.  Continue atorvastatin 40mg  daily.    Follow up plan: Return in about 4 weeks (around 04/15/2019) for follow up visit.  [name], MD

## 2019-03-18 NOTE — Assessment & Plan Note (Signed)
New episodes of afib noted on EKG in hospital. Discussed benefits/risks of anticoagulation. She is already on aspirin 81mg  daily. She has cardiology virtual appointment next week. Sounds regular today - will update EKG today.

## 2019-03-18 NOTE — Assessment & Plan Note (Signed)
Chronic, improving readings. Continue current regimen.

## 2019-03-18 NOTE — Assessment & Plan Note (Signed)
New onset seizure, presented to ER in status epilepticus, workup largely unrevealing. ?hypoglycemia related in diabetic with insulin use. Reinforced need to avoid driving for 6 months from last seizure. She is now on low dose keppra and tolerating well besides mild sedation noted. Appreciate neuro care.

## 2019-03-18 NOTE — Assessment & Plan Note (Signed)
Again reviewed with patient and daughter. Confusion last visit if she was taking atorvastatin. She was actually prescribed 40mg  dose by endo - advisdd continue this dose, med list updated.

## 2019-03-18 NOTE — Assessment & Plan Note (Signed)
New diagnosis by neurology. She has not had significant memory impairment previously and had lived independently in her [place]. Now living with daughter for the forseeable future, discussed how prolonged seizure could negatively impact cognition. Her goal is to return to her condo. Encouraged they look into life alert system. Will continue to monitor.

## 2019-03-25 ENCOUNTER — Encounter: Payer: Self-pay | Admitting: Cardiovascular Disease

## 2019-03-25 ENCOUNTER — Telehealth: Payer: Self-pay

## 2019-03-25 ENCOUNTER — Telehealth (INDEPENDENT_AMBULATORY_CARE_PROVIDER_SITE_OTHER): Payer: Medicare Other | Admitting: Cardiovascular Disease

## 2019-03-25 DIAGNOSIS — I480 Paroxysmal atrial fibrillation: Secondary | ICD-10-CM

## 2019-03-25 DIAGNOSIS — E782 Mixed hyperlipidemia: Secondary | ICD-10-CM

## 2019-03-25 DIAGNOSIS — I10 Essential (primary) hypertension: Secondary | ICD-10-CM | POA: Diagnosis not present

## 2019-03-25 NOTE — Telephone Encounter (Signed)
Patient  aware of 10/21 AVS instructions and verbalized understanding.  Letter including After Visit Summary and any other necessary documents to be mailed to the patient's address on file.  Staff message sent to Medical Records pool to mail AVS to patient address.

## 2019-03-25 NOTE — Patient Instructions (Signed)
Medication Instructions:  none If you need a refill on your cardiac medications before your next appointment, please call your pharmacy.   Lab work: none If you have labs (blood work) drawn today and your tests are completely normal, you will receive your results only by: Marland Kitchen MyChart Message (if you have MyChart) OR . A paper copy in the mail If you have any lab test that is abnormal or we need to change your treatment, we will call you to review the results.  Testing/Procedures: none  Follow-Up: At [facility], you and your health needs are our priority.  As part of our continuing mission to provide you with exceptional heart care, we have created designated Provider Care Teams.  These Care Teams include your primary Cardiologist (physician) and Advanced Practice Providers (APPs -  Physician Assistants and Nurse Practitioners) who all work together to provide you with the care you need, when you need it. . You will need a follow up appointment as needed. You may see Dr. Gwenlyn Found or one of the following Advanced Practice Providers on your designated Care Team:   . Kerin Ransom, PA-C . Daleen Snook Kroeger, PA-C . Sande Rives, PA-C

## 2019-03-25 NOTE — Progress Notes (Signed)
Virtual Visit via Telephone Note   This visit type was conducted due to national recommendations for restrictions regarding the COVID-19 Pandemic (e.g. social distancing) in an effort to limit this patient's exposure and mitigate transmission in our community.  Due to her co-morbid illnesses, this patient is at least at moderate risk for complications without adequate follow up.  This format is felt to be most appropriate for this patient at this time.  The patient did not have access to video technology/had technical difficulties with video requiring transitioning to audio format only (telephone).  All issues noted in this document were discussed and addressed.  No physical exam could be performed with this format.  Please refer to the patient's chart for her  consent to telehealth for [facility].   Date:  03/25/2019   ID:  Taylor Castaneda, DOB Jan 23, 1980, MRN [id number]  Patient Location: Home Provider Location: Home  PCP:  [name], MD  Cardiologist: Dr. Quay Burow Electrophysiologist:  None   Evaluation Performed:  New Patient Evaluation  Chief Complaint: Atrial fibrillation/hypertension  History of Present Illness:    Taylor Castaneda is a 85 y.o. widowed Caucasian female mother of 4 children, grandmother of 1 grandchild referred by Dr. Danise Mina for initial cardiovascular valuation because of recent [place] documented atrial fibrillation.  She is accompanied by her daughter Taylor Castaneda today on a telemedicine phone first visit.  She worked as a Regulatory affairs officer in the past.  Her risk factors include remote tobacco abuse, treated hypertension, hyperlipidemia and family history with a father had a myocardial infarction.  She is also diabetic and has chronic kidney disease.  She is never had a heart attack or stroke.  She denies chest pain or shortness of breath.  Ordinarily she lives alone but now is living with her daughter.  She was hospitalized at [facility] from October 1-3.   She is found down on the floor with altered mental status.  Work-up suggested that she had a grand mal seizure and she was placed on Keppra.  She was briefly in atrial fibrillation and spontaneously converted to normal sinus rhythm.  She was not placed on oral anticoagulation because of fall risk.  The patient does not have symptoms concerning for COVID-19 infection (fever, chills, cough, or new shortness of breath).    Past Medical History:  Diagnosis Date  . Anemia   . Diabetes mellitus, type II ([place])   . Glaucoma    Dr.Hecker  . History of colon cancer 1998   Dr. Lennie Hummer  . Hyperlipemia   . Osteopenia 02/2012, 03/2014   DEXA hip -2.2  . Renal insufficiency   . Seizures ([place])   . Stenosing tenosynovitis of finger of left hand 2016   index - s/p steroid injection x2   Past Surgical History:  Procedure Laterality Date  . ABI  11/2013   WNL  . APPENDECTOMY    . CARPAL TUNNEL RELEASE     Bilateral   . CATARACT EXTRACTION Bilateral 2006   Implants  . COLECTOMY  1998   For cancer  . COLONOSCOPY  2008   Dr.Stark, Due 2011  . COLONOSCOPY  06/2013   1 hyperplastic polyp, ileocecal anastomosis [facility])  . dexa  02/2012, 03/2014   T score -2.2 at hip overall stable  . REFRACTIVE SURGERY    . TONSILLECTOMY AND ADENOIDECTOMY    . TOTAL KNEE ARTHROPLASTY Left 12/17/2016   Procedure: LEFT TOTAL KNEE ARTHROPLASTY;  Surgeon: Gaynelle Arabian, MD;  Location:  [facility] ORS;  Service: Orthopedics;  Laterality: Left;     Current Meds  Medication Sig  . aspirin 81 MG EC tablet Take 1 tablet (81 mg total) by mouth daily. Swallow whole.  Marland Kitchen atorvastatin (LIPITOR) 40 MG tablet Take 1 tablet (40 mg total) by mouth daily at 6 PM.  . B-D ULTRAFINE III SHORT PEN 31G X 8 MM MISC USE AS DIRECTED WITH INSULIN  . Biotin 10000 MCG TABS Take 1 tablet by mouth daily.  . Carboxymethylcellul-Glycerin (CLEAR EYES FOR DRY EYES) 1-0.25 % SOLN Place 1-2 drops into both eyes 3 (three) times daily as needed (for  dry/irritated eyes.).  Marland Kitchen carvedilol (COREG) 25 MG tablet Take 1 tablet (25 mg total) by mouth 2 (two) times daily.  . Cholecalciferol (VITAMIN D3) 25 MCG (1000 UT) CAPS Take 1 capsule (1,000 Units total) by mouth daily.  . Cinnamon 500 MG capsule Take 500 mg by mouth daily.  . colchicine 0.6 MG tablet Take 1 tablet (0.6 mg total) by mouth daily as needed (gout flare). First day of gout flare, may take 1 tablet twice daily.  . Ferrous Sulfate (IRON) 325 (65 Fe) MG TABS Take 1 tablet by mouth 2 (two) times a week. Takes Mon and [employer]  . furosemide (LASIX) 40 MG tablet Take 0.5 tablets (20 mg total) by mouth daily.  Marland Kitchen glucose blood (FREESTYLE LITE) test strip Use as instructed (Patient taking differently: 1 each by Other route 4 (four) times daily. Use as instructed)  . HUMALOG KWIKPEN 100 UNIT/ML KiwkPen Inject 1-10 Units into the skin 3 (three) times daily before meals. Sliding scale  . LANTUS SOLOSTAR 100 UNIT/ML Solostar Pen Inject 30 Units into the skin at bedtime.  . levETIRAcetam (KEPPRA) 250 MG tablet Take 1 tablet (250 mg total) by mouth 2 (two) times daily.  . Multiple Vitamins-Minerals (PRESERVISION AREDS 2 PO) Take by mouth 2 (two) times daily.  . potassium chloride (K-DUR) 10 MEQ tablet TAKE 1  BY MOUTH ON MONDAY, [facility] AND FRIDAY (Patient taking differently: Take 10 mEq by mouth every Monday, Wednesday, and Friday. )  . ramipril (ALTACE) 10 MG capsule Take 1 capsule (10 mg total) by mouth daily.     Allergies:   Pravastatin sodium   Social History   Tobacco Use  . Smoking status: Former Smoker    Years: 2.00    Types: Cigarettes    Quit date: 06/04/1973    Years since quitting: 45.8  . Smokeless tobacco: Never Used  . Tobacco comment: Smoked for 5 years 1965-1970 up to 1/2 pp week  Substance Use Topics  . Alcohol use: Not Currently  . Drug use: No     Family Hx: The patient's family history includes Breast cancer in her mother and sister; Cancer in her mother and  sister; Diabetes in her father and sister; Heart attack (age of onset: 68) in her father; Heart disease in her father; Hypertension in her father and son; Peripheral vascular disease in her sister; Stroke (age of onset: 35) in her father. There is no history of COPD, Asthma, Colon cancer, Esophageal cancer, Rectal cancer, or Stomach cancer.  ROS:   Please see the history of present illness.     All other systems reviewed and are negative.   Prior CV studies:   The following studies were reviewed today:  2D echocardiogram performed 03/06/2019 was entirely normal  Labs/Other Tests and Data Reviewed:    EKG:  No ECG reviewed.  Recent Labs: 01/28/2019: TSH 2.12  03/06/2019: ALT 17; BUN 29; Creatinine, Ser 1.25; Hemoglobin 11.7; Platelets 178; Potassium 4.3; Sodium 138   Recent Lipid Panel Lab Results  Component Value Date/Time   CHOL 275 (H) 02/26/2019 08:55 AM   TRIG 149.0 02/26/2019 08:55 AM   HDL 39.80 02/26/2019 08:55 AM   CHOLHDL 7 02/26/2019 08:55 AM   LDLCALC 206 (H) 02/26/2019 08:55 AM   LDLDIRECT 103.0 02/18/2018 09:36 AM    Wt Readings from Last 3 Encounters:  03/25/19 119 lb (54 kg)  03/18/19 119 lb 5 oz (54.1 kg)  03/10/19 123 lb 9.6 oz (56.1 kg)     Objective:    Vital Signs:  Ht 5' (1.524 m)   Wt 119 lb (54 kg)   BMI 23.24 kg/m    VITAL SIGNS:  reviewed blood pressure on Sunday was 126/58.  A complete physical exam was not performed today since this was a virtual telemedicine phone first visit.  ASSESSMENT & PLAN:    1. Paroxysmal atrial fibrillation-recent episode of PAF in the setting of seizure activity quickly converting to sinus rhythm spontaneously.  Given the patient's fall risk it was elected not to place her on oral anticoagulation. 2. Essential hypertension-history of essential hypertension on carvedilol and Lotensin with blood pressure measured at 126/58 this past Sunday followed by her PCP 3. Hyperlipidemia-history of hyperlipidemia with lipid  profile performed 02/26/2019 revealing total cholesterol 275, LDL of 206 and HDL 39 on atorvastatin 20 which was just increased to atorvastatin 40 mg a day followed by her PCP  COVID-19 Education: The signs and symptoms of COVID-19 were discussed with the patient and how to seek care for testing (follow up with PCP or arrange E-visit).  The importance of social distancing was discussed today.  Time:   Today, I have spent 8 minutes with the patient with telehealth technology discussing the above problems.     Medication Adjustments/Labs and Tests Ordered: Current medicines are reviewed at length with the patient today.  Concerns regarding medicines are outlined above.   Tests Ordered: No orders of the defined types were placed in this encounter.   Medication Changes: No orders of the defined types were placed in this encounter.   Follow Up:  In Person prn  Signed, Quay Burow, MD  03/25/2019 10:57 AM    [place]

## 2019-03-31 ENCOUNTER — Ambulatory Visit: Payer: Medicare Other | Attending: Family Medicine | Admitting: Physical Therapy

## 2019-03-31 ENCOUNTER — Encounter: Payer: Self-pay | Admitting: Physical Therapy

## 2019-03-31 ENCOUNTER — Other Ambulatory Visit: Payer: Self-pay

## 2019-03-31 VITALS — BP 151/59 | HR 59

## 2019-03-31 DIAGNOSIS — R2681 Unsteadiness on feet: Secondary | ICD-10-CM | POA: Insufficient documentation

## 2019-03-31 DIAGNOSIS — R262 Difficulty in walking, not elsewhere classified: Secondary | ICD-10-CM

## 2019-03-31 DIAGNOSIS — R2689 Other abnormalities of gait and mobility: Secondary | ICD-10-CM | POA: Insufficient documentation

## 2019-03-31 NOTE — Patient Instructions (Signed)
Exercises Standing with Head Rotation - 10 reps - 2 sets - 1x daily - 7x weekly Standing Tandem Balance with Counter Support - 2 reps - 1 sets - 30s hold - 2x daily - 7x weekly Standing Hip Extension Kicks - 10 reps - 2 sets - 3 sec hold - 2x daily - 7x weekly  Access Code: [email or web address]

## 2019-03-31 NOTE — Therapy (Addendum)
[place] MAIN [facility] SERVICES [address] [place], [place], [address] Phone: [phone]   Fax:  [phone]  Physical Therapy Evaluation  Patient Details  Name: Taylor Castaneda MRN: [id number] Date of Birth: 1933/02/26 Referring Provider (PT): [name]   Encounter Date: 03/31/2019  PT End of Session - 03/31/19 0918    Visit Number  1    Number of Visits  17    Date for PT Re-Evaluation  05/26/19    PT Start Time  0802    PT Stop Time  0905    PT Time Calculation (min)  63 min    Equipment Utilized During Treatment  Gait belt    Activity Tolerance  Patient tolerated treatment well    Behavior During Therapy  [facility] for tasks assessed/performed       Past Medical History:  Diagnosis Date  . Anemia   . Diabetes mellitus, type II ([place])   . Glaucoma    Dr.Hecker  . History of colon cancer 1998   Dr. Lennie Hummer  . Hyperlipemia   . Osteopenia 02/2012, 03/2014   DEXA hip -2.2  . Renal insufficiency   . Seizures ([place])   . Stenosing tenosynovitis of finger of left hand 2016   index - s/p steroid injection x2    Past Surgical History:  Procedure Laterality Date  . ABI  11/2013   WNL  . APPENDECTOMY    . CARPAL TUNNEL RELEASE     Bilateral   . CATARACT EXTRACTION Bilateral 2006   Implants  . COLECTOMY  1998   For cancer  . COLONOSCOPY  2008   Dr.Stark, Due 2011  . COLONOSCOPY  06/2013   1 hyperplastic polyp, ileocecal anastomosis [facility])  . dexa  02/2012, 03/2014   T score -2.2 at hip overall stable  . REFRACTIVE SURGERY    . TONSILLECTOMY AND ADENOIDECTOMY    . TOTAL KNEE ARTHROPLASTY Left 12/17/2016   Procedure: LEFT TOTAL KNEE ARTHROPLASTY;  Surgeon: Gaynelle Arabian, MD;  Location: [facility];  Service: Orthopedics;  Laterality: Left;    Vitals:   03/31/19 0758  BP: (!) 151/59  Pulse: (!) 59     Subjective Assessment - 03/31/19 0758    Subjective  Feels slightly unsteady on her feet and is walking slowly. Is  currently living at her daughter's house until they feel comfortable with her returning to her own private residence indpependently.    Pertinent History  Pt is an 85 y/o female admitted to hospital on 03/05/19 after being found down in the floor by family members. MRI of the brain was negative for any acute findings. Neurology was consulted and suspect new onset seizures. Discharged from hospital on 03/07/19. PMH including but not limited to DM, HTN, glaucoma and colon cancer. Patient was independent with ADLs/IADLs prior to hospital admission with occasional use of cane for ambulation. Patient was able to perform bed mobility and transfers but unsteady with ambulation with 1 HHA x50 feet and gait deviation to right with decreased RLE step length, also demonstrated poor balance AEB standing balance Leahy scale score of 'poor'. Now living with her daughter, prior to admission was living alone.    How long can you sit comfortably?  2-3 hours    How long can you stand comfortably?  5-10 minutes    How long can you walk comfortably?  Able to walk around house about 8 times without SPC.    Diagnostic tests  MRI confirming absence  of stroke    Patient Stated Goals  Taylor Castaneda to return to water aerobics and walking normally.    Currently in Pain?  No/denies    Pain Score  0-No pain    Multiple Pain Sites  No         [facility] PT Assessment - 03/31/19 0001      Assessment   Medical Diagnosis  Siezure    Referring Provider (PT)  [name]    Onset Date/Surgical Date  03/05/19    Hand Dominance  Right    Next MD Visit  04/21/2019    Prior Therapy  Yes   For knee replacement     Precautions   Precautions  Knee   Left knee replacement     Balance Screen   Has the patient fallen in the past 6 months  Yes    How many times?  1    Has the patient had a decrease in activity level because of a fear of falling?   No    Is the patient reluctant to leave their home because of a fear of falling?   No       Home Social worker  Private residence    Living Arrangements  Children    Available Help at Discharge  Family    Type of [place] to enter    Entrance Stairs-Number of Steps  3    [place]  One level    [place] - 2 wheels;Cane - single point;Shower seat;Shower seat - built in;Grab bars - toilet;Grab bars - tub/shower;Hand held shower head    Additional Comments  Living with daughter currently, pt lives in a condo normally.  Has elevator to enter 2nd floor of condo which is single level residence.       Prior Function   Level of Independence  Independent    Vocation  Retired    Leisure  [employer], walking      Cognition   Overall Cognitive Status  Within Functional Limits for tasks assessed      [employer] to Stand  Able to stand without using hands and stabilize independently    Standing Unsupported  Able to stand safely 2 minutes    Sitting with Back Unsupported but Feet Supported on Floor or Stool  Able to sit safely and securely 2 minutes    Stand to Sit  Sits safely with minimal use of hands    Transfers  Able to transfer safely, minor use of hands    Standing Unsupported with Eyes Closed  Able to stand 10 seconds with supervision    Standing Unsupported with Feet Together  Able to place feet together independently and stand for 1 minute with supervision   Postural sway   From Standing, Reach Forward with Outstretched Arm  Can reach forward >5 cm safely (2")    From Standing Position, Pick up Object from Floor  Able to pick up shoe safely and easily    From Standing Position, Turn to Look Behind Over each Shoulder  Turn sideways only but maintains balance    Turn 360 Degrees  Able to turn 360 degrees safely in 4 seconds or less    Standing Unsupported, Alternately Place Feet on Step/Stool  Able to stand independently and complete 8 steps >20 seconds    Standing  Unsupported,  One Foot in [place] to plae foot ahead of the other independently and hold 30 seconds    Standing on One Leg  Able to lift leg independently and hold equal to or more than 3 seconds    Total Score  46      PAIN: No pain this date.   POSTURE: WNL   PROM/AROM: WNL  STRENGTH:  Graded on a 0-5 scale Muscle Group Left Right  Hip Flex 4+ 5  Hip Abd 5 5  Hip Add 4 4  Hip Ext 4- 4-  Hip IR/ER 5 5  Knee Flex 5 5  Knee Ext 5 5  Ankle DF 5 5  Ankle PF 4+ 5    COORDINATION: Finger nose WNL Heel-shin; left heel does not touch shin,    FUNCTIONAL MOBILITY: Able to perform bed mobility inde[endently, sit to stand independent with minimal use of armrests.    BALANCE: Able to shift weight, has postural sway with narrow BOS (feet together, semi-tandem, tandem stance)    GAIT: Step-through gait, uses cane in public but no AD at home. Slight gait deviation to right with slow steps, short step length.   OUTCOME MEASURES: TEST Outcome Interpretation  5 times sit<>stand 12.02 sec >60 yo, >15 sec indicates increased risk for falls  10 meter walk test        0.86  m/s <1.0 m/s indicates increased risk for falls; limited community ambulator  Timed up and Go       13.2   sec <14 sec indicates increased risk for falls  Berg Balance Assessment 46/56 <36/56 (100% risk for falls), 37-45 (80% risk for falls); 46-51 (>50% risk for falls); 52-55 (lower risk <25% of falls)         Objective measurements completed on examination: See above findings.    Neuro Re-ed: Patient requires CGA during all standing interventions due to limited stability and patient fear of LOB. Cueing for reduction of UE support required as well as postural alignment for optimal stability within COM   Treatment:  NMRE:  Standing semi-tandem stance with head turns x10 each LE forward, unsupported; increased postural sway in direction of head turn, occasional UE support  Standing tandem stance hold x30  sec each LE forward; frequent UE support  Therex:  Bridges x10; no challenge for patient  Standing hip ext x10 BLE with red TB, BUE support  Pt educated throughout session about proper posture and technique with exercises. Improved exercise technique, movement at target joints, use of target muscles after min to mod verbal, visual, tactile cues.   Patient demonstrates good motivation throughout today's session. Patient performed static balance intervention and LE hip strengthening exercises, required min VC and demonstration for technique. Patient challenged by maintaining narrow BOS, which improved with regressing stance and repetition.          PT Education - 03/31/19 0945    Education provided  Yes    Education Details  HEP, POC    Person(s) Educated  Patient;Child(ren)    Methods  Explanation;Handout    Comprehension  Verbalized understanding       PT Short Term Goals - 03/31/19 0865      PT SHORT TERM GOAL #1   Title  Patient will be independent in initial home exercise program to improve strength/mobility for better functional independence with ADLs.    Time  4    Period  Weeks    Status  New    Target Date  04/28/19        PT Long Term Goals - 03/31/19 0929      PT LONG TERM GOAL #1   Title  Patient will be independent in finalized home exercise program to improve strength/mobility for better functional independence with ADLs.    Time  8    Period  Weeks    Status  New    Target Date  05/26/19      PT LONG TERM GOAL #2   Title  Patient will increase 10 meter walk test to >1.63m/s as to improve gait speed for better community ambulation and to reduce fall risk.    Baseline  03/31/19: 0.86 m/s;    Time  8    Period  Weeks    Status  New    Target Date  05/26/19      PT LONG TERM GOAL #3   Title  Patient will increase Berg Balance score by > 6 points to demonstrate decreased fall risk during functional activities.    Baseline  03/31/19: 46/56;    Time   8    Period  Weeks    Status  New    Target Date  05/26/19      PT LONG TERM GOAL #4   Title  Patient will increase six minute walk test distance to >1000 for progression to community ambulator and improve gait ability    Time  8    Period  Weeks    Status  New    Target Date  05/26/19             Plan - 03/31/19 3500    Clinical Impression Statement  Patient is a 85 year old female who presents to PT eval for imbalance and gait s/p siezure. Patient has decreased balance, gait speed, hip strength, and outcomes indicate a falls risk. Patient will benefit from skilled PT intervention to improve balance stability and gait safety.    Personal Factors and Comorbidities  Age;Comorbidity 3+    Comorbidities  HTN, DM, cancer (colon), hx of siezure    Examination-Activity Limitations  Carry;Reach Overhead;Locomotion Level;Lift;Stairs;Stand    Examination-Participation Restrictions  Yard Work;Community Activity    Stability/Clinical Decision Making  Stable/Uncomplicated    Clinical Decision Making  Low    Rehab Potential  Excellent    PT Frequency  2x / week    PT Duration  8 weeks    PT Treatment/Interventions  ADLs/Self Care Home Management;Aquatic Therapy;Moist Heat;DME Instruction;Gait training;Stair training;Functional mobility training;Therapeutic activities;Therapeutic exercise;Balance training;Neuromuscular re-education;Patient/family education;Manual techniques    PT Next Visit Plan  Assess 6MWT, perform static and dynamic balance activities    PT Home Exercise Plan  Tandem stance hold, semi-tandem head turns, hip ext red TB    Consulted and Agree with Plan of Care  Patient;Family member/caregiver    Family Member Consulted  Daughter       Patient will benefit from skilled therapeutic intervention in order to improve the following deficits and impairments:  Abnormal gait, Decreased balance, Decreased endurance, Decreased mobility, Difficulty walking, Cardiopulmonary status  limiting activity, Decreased strength, Decreased coordination, Decreased activity tolerance  Visit Diagnosis: Unsteadiness on feet - Plan: PT plan of care cert/re-cert  Other abnormalities of gait and mobility - Plan: PT plan of care cert/re-cert  Difficulty in walking, not elsewhere classified - Plan: PT plan of care cert/re-cert     Problem List Patient Active Problem List   Diagnosis Date Noted  . Seizure ([place]) 03/18/2019  .  Transient atrial fibrillation ([place]) 03/18/2019  . MCI (mild cognitive impairment) 03/18/2019  . Status epilepticus ([place]) 03/05/2019  . Weight loss 01/28/2019  . Acute gout 04/29/2017  . OA (osteoarthritis) of knee 12/17/2016  . Dry skin dermatitis 06/13/2016  . Health maintenance examination 01/30/2016  . Medicare annual wellness visit, subsequent 03/10/2014  . Advanced care planning/counseling discussion 03/10/2014  . PAD (peripheral artery disease) ([place]) 11/11/2013  . Dyspnea 09/18/2011  . PMR (polymyalgia rheumatica) ([place]) 08/27/2011  . Controlled type 2 diabetes mellitus with diabetic nephropathy, with long-term current use of insulin ([place]) 08/04/2009  . Essential hypertension 06/13/2009  . CKD stage 3 due to type 2 diabetes mellitus ([place]) 01/24/2009  . Primary angle-closure glaucoma 11/13/2007  . Osteopenia 11/13/2007  . History of colon cancer 11/13/2007  . HLD (hyperlipidemia) 10/23/2006   Jeneen Rinks A. Rosana Hoes, SPT This entire session was performed under direct supervision and direction of a licensed therapist/therapist assistant . I have personally read, edited and approve of the note as written.  Trotter,Margaret PT, DPT 03/31/2019, 10:18 AM  [place] MAIN [facility] SERVICES [address] [place], [place], [address] Phone: [phone]   Fax:  [phone]  Name: Taylor Castaneda MRN: [id number] Date of Birth: 25-Nov-1932

## 2019-04-01 ENCOUNTER — Ambulatory Visit: Payer: Medicare Other | Admitting: Family Medicine

## 2019-04-02 ENCOUNTER — Ambulatory Visit: Payer: Medicare Other | Admitting: Physical Therapy

## 2019-04-02 ENCOUNTER — Other Ambulatory Visit: Payer: Self-pay

## 2019-04-02 ENCOUNTER — Encounter: Payer: Self-pay | Admitting: Physical Therapy

## 2019-04-02 VITALS — BP 160/57 | HR 56

## 2019-04-02 DIAGNOSIS — R262 Difficulty in walking, not elsewhere classified: Secondary | ICD-10-CM | POA: Diagnosis not present

## 2019-04-02 DIAGNOSIS — R2689 Other abnormalities of gait and mobility: Secondary | ICD-10-CM | POA: Diagnosis not present

## 2019-04-02 DIAGNOSIS — R2681 Unsteadiness on feet: Secondary | ICD-10-CM

## 2019-04-02 NOTE — Therapy (Signed)
[place] MAIN [facility] SERVICES [address] [place], [place], [address] Phone: [phone]   Fax:  [phone]  Physical Therapy Treatment  Patient Details  Name: Taylor Castaneda MRN: [id number] Date of Birth: 05/24/1933 Referring Provider (PT): [name]   Encounter Date: 04/02/2019  PT End of Session - 04/02/19 0800    Visit Number  2    Number of Visits  17    Date for PT Re-Evaluation  05/26/19    PT Start Time  0802    PT Stop Time  0845    PT Time Calculation (min)  43 min    Equipment Utilized During Treatment  Gait belt    Activity Tolerance  Patient tolerated treatment well    Behavior During Therapy  [facility] for tasks assessed/performed       Past Medical History:  Diagnosis Date  . Anemia   . Diabetes mellitus, type II ([place])   . Glaucoma    Dr.Hecker  . History of colon cancer 1998   Dr. Lennie Hummer  . Hyperlipemia   . Osteopenia 02/2012, 03/2014   DEXA hip -2.2  . Renal insufficiency   . Seizures ([place])   . Stenosing tenosynovitis of finger of left hand 2016   index - s/p steroid injection x2    Past Surgical History:  Procedure Laterality Date  . ABI  11/2013   WNL  . APPENDECTOMY    . CARPAL TUNNEL RELEASE     Bilateral   . CATARACT EXTRACTION Bilateral 2006   Implants  . COLECTOMY  1998   For cancer  . COLONOSCOPY  2008   Dr.Stark, Due 2011  . COLONOSCOPY  06/2013   1 hyperplastic polyp, ileocecal anastomosis Fuller Plan)  . dexa  02/2012, 03/2014   T score -2.2 at hip overall stable  . REFRACTIVE SURGERY    . TONSILLECTOMY AND ADENOIDECTOMY    . TOTAL KNEE ARTHROPLASTY Left 12/17/2016   Procedure: LEFT TOTAL KNEE ARTHROPLASTY;  Surgeon: Gaynelle Arabian, MD;  Location: [facility];  Service: Orthopedics;  Laterality: Left;    Vitals:   04/02/19 0801  BP: (!) 160/57  Pulse: (!) 56  SpO2: 100%    Subjective Assessment - 04/02/19 0801    Subjective  Patient states she is doing well today, no new falls or  concerns. Has been HEP compliant. Daughter reports she has not been drinking much water.    Pertinent History  Pt is an 85 y/o female admitted to hospital on 03/05/19 after being found down in the floor by family members. MRI of the brain was negative for any acute findings. Neurology was consulted and suspect new onset seizures. Discharged from hospital on 03/07/19. PMH including but not limited to DM, HTN, glaucoma and colon cancer. Patient was independent with ADLs/IADLs prior to hospital admission with occasional use of cane for ambulation. Patient was able to perform bed mobility and transfers but unsteady with ambulation with 1 HHA x50 feet and gait deviation to right with decreased RLE step length, also demonstrated poor balance AEB standing balance Leahy scale score of 'poor'. Now living with her daughter, prior to admission was living alone.    How long can you sit comfortably?  2-3 hours    How long can you stand comfortably?  5-10 minutes    How long can you walk comfortably?  Able to walk around house about 8 times without SPC.    Diagnostic tests  MRI confirming absence of stroke  Patient Stated Goals  [name] to return to water aerobics and walking normally.    Currently in Pain?  No/denies    Pain Score  0-No pain       Neuro Re-ed: Patient requires CGA during all standing interventions due to limited stability and patient fear of LOB. Cueing for reduction of UE support required as well as postural alignment for optimal stability within COM   TREATMENT:  Assess 6MWT Vitals pre  BP: 160/57 HR: 56 bpm SpO2: 100% Vitals post  BP: 173/56 HR: 60 bpm SpO2: 100% Distance: 590 ft, able to walk for 4.5 min before needing to stop due to cramp in left calf. Reports not drinking much water.   NMRE:  Standing in semi-tandem stance, each LE forward:  -Head turns x10 side/side; postural sway in firection of head turn  -Eyes open/closed 3x10 sec hold; challenging for patient, postural sway and  utilized intermittent UE support  -Ball pass over shoulders x10; to challenge reaching outside BOS; not as difficult  Standing march x20 with cues to lift feet high to facilitate increased SLS time  Standing march on airex foam x20; very challenging for patient, unable to pick up feet high and exhibits postural sway, requiring corrective step or UE support  Alternating toe taps to 4" step x20; increasingly more difficult with lateral sway with fatigue, reports of calf cramps  Standing calf stretch leaning into treadmill rail 2x30 sec hold BLE; reports some relief from calf tightness  -Another set of alternating toe taps to 4" step with improved form and less  stumbling and lateral sway  Alternating step ups to 6" step x10; significant postural sway, requiring UE support and corrective stepping; improved with min VC to slow down and pause at top of step to ensure she is balanced before descending.   Pt educated throughout session about proper posture and technique with exercises. Improved exercise technique, movement at target joints, use of target muscles after min to mod verbal, visual, tactile cues.   Patient demonstrates excellent motivation throughout today's session. Assessed 6MWT this date, patient able to ambulate for 4:30 min and required a rest break due to calf cramping, ambulated 590 feet, indicating that she is a falls risk and not at the community ambulator level. Patient was able to perform balance interventions with narrow BOS and compliant surfaces, required frequent UE support and corrective stepping to regain balance. Patient continues to be challenged by dynamic activities that involve SLS resulting in significant lateral sway and LOB, which improved with VC to slow down movements and repetition. Patient would continue to benefit from skilled PT to address the deficits outlined in this note.       PT Education - 04/02/19 0915    Education provided  Yes    Education Details   HEP, balance    Person(s) Educated  Patient;Child(ren)    Methods  Explanation;Demonstration;Tactile cues;Verbal cues    Comprehension  Verbalized understanding;Returned demonstration;Verbal cues required;Need further instruction       PT Short Term Goals - 03/31/19 4315      PT SHORT TERM GOAL #1   Title  Patient will be independent in initial home exercise program to improve strength/mobility for better functional independence with ADLs.    Time  4    Period  Weeks    Status  New    Target Date  04/28/19        PT Long Term Goals - 03/31/19 4008      PT  LONG TERM GOAL #1   Title  Patient will be independent in finalized home exercise program to improve strength/mobility for better functional independence with ADLs.    Time  8    Period  Weeks    Status  New    Target Date  05/26/19      PT LONG TERM GOAL #2   Title  Patient will increase 10 meter walk test to >1.33m/s as to improve gait speed for better community ambulation and to reduce fall risk.    Baseline  03/31/19: 0.86 m/s;    Time  8    Period  Weeks    Status  New    Target Date  05/26/19      PT LONG TERM GOAL #3   Title  Patient will increase Berg Balance score by > 6 points to demonstrate decreased fall risk during functional activities.    Baseline  03/31/19: 46/56;    Time  8    Period  Weeks    Status  New    Target Date  05/26/19      PT LONG TERM GOAL #4   Title  Patient will increase six minute walk test distance to >1000 for progression to community ambulator and improve gait ability    Time  8    Period  Weeks    Status  New    Target Date  05/26/19            Plan - 04/02/19 0913    Clinical Impression Statement  Patient demonstrates excellent motivation throughout today's session. Assessed 6MWT this date, patient able to ambulate for 4:30 min and required a rest break due to calf cramping, ambulated 590 feet, indicating that she is a falls risk and not at the community ambulator level.  Patient was able to perform balance interventions with narrow BOS and compliant surfaces, required frequent UE support and corrective stepping to regain balance. Patient continues to be challenged by dynamic activities that involve SLS resulting in significant lateral sway and LOB, which improved with VC to slow down movements and repetition. Patient would continue to benefit from skilled PT to address the deficits outlined in this note.    Personal Factors and Comorbidities  Age;Comorbidity 3+    Comorbidities  HTN, DM, cancer (colon), hx of siezure    Examination-Activity Limitations  Carry;Reach Overhead;Locomotion Level;Lift;Stairs;Stand    Examination-Participation Restrictions  Yard Work;Community Activity    Stability/Clinical Decision Making  Stable/Uncomplicated    Rehab Potential  Excellent    PT Frequency  2x / week    PT Duration  8 weeks    PT Treatment/Interventions  ADLs/Self Care Home Management;Aquatic Therapy;Moist Heat;DME Instruction;Gait training;Stair training;Functional mobility training;Therapeutic activities;Therapeutic exercise;Balance training;Neuromuscular re-education;Patient/family education;Manual techniques    PT Next Visit Plan  Static and dynamic balance activities, challenge SLS and step negotiation    PT Home Exercise Plan  Tandem stance hold, semi-tandem head turns, hip ext red TB    Consulted and Agree with Plan of Care  Patient;Family member/caregiver    Family Member Consulted  Daughter       Patient will benefit from skilled therapeutic intervention in order to improve the following deficits and impairments:  Abnormal gait, Decreased balance, Decreased endurance, Decreased mobility, Difficulty walking, Cardiopulmonary status limiting activity, Decreased strength, Decreased coordination, Decreased activity tolerance  Visit Diagnosis: Unsteadiness on feet  Other abnormalities of gait and mobility  Difficulty in walking, not elsewhere  classified     Problem List  Patient Active Problem List   Diagnosis Date Noted  . Seizure ([place]) 03/18/2019  . Transient atrial fibrillation ([place]) 03/18/2019  . MCI (mild cognitive impairment) 03/18/2019  . Status epilepticus ([place]) 03/05/2019  . Weight loss 01/28/2019  . Acute gout 04/29/2017  . OA (osteoarthritis) of knee 12/17/2016  . Dry skin dermatitis 06/13/2016  . Health maintenance examination 01/30/2016  . Medicare annual wellness visit, subsequent 03/10/2014  . Advanced care planning/counseling discussion 03/10/2014  . PAD (peripheral artery disease) ([place]) 11/11/2013  . Dyspnea 09/18/2011  . PMR (polymyalgia rheumatica) ([place]) 08/27/2011  . Controlled type 2 diabetes mellitus with diabetic nephropathy, with long-term current use of insulin ([place]) 08/04/2009  . Essential hypertension 06/13/2009  . CKD stage 3 due to type 2 diabetes mellitus ([place]) 01/24/2009  . Primary angle-closure glaucoma 11/13/2007  . Osteopenia 11/13/2007  . History of colon cancer 11/13/2007  . HLD (hyperlipidemia) 10/23/2006   Jeneen Rinks A. Rosana Hoes, SPT This entire session was performed under direct supervision and direction of a licensed therapist/therapist assistant . I have personally read, edited and approve of the note as written.  Trotter,Margaret PT, DPT 04/02/2019, 12:14 PM  [place] MAIN [facility] SERVICES [address] [place], [place], [address] Phone: [phone]   Fax:  [phone]  Name: SHAWANNA ZANDERS MRN: [id number] Date of Birth: Mar 22, 1933

## 2019-04-06 ENCOUNTER — Other Ambulatory Visit: Payer: Self-pay

## 2019-04-06 ENCOUNTER — Encounter: Payer: Self-pay | Admitting: Physical Therapy

## 2019-04-06 ENCOUNTER — Ambulatory Visit: Payer: Medicare Other | Attending: Family Medicine | Admitting: Physical Therapy

## 2019-04-06 DIAGNOSIS — R2681 Unsteadiness on feet: Secondary | ICD-10-CM | POA: Insufficient documentation

## 2019-04-06 DIAGNOSIS — E1165 Type 2 diabetes mellitus with hyperglycemia: Secondary | ICD-10-CM | POA: Diagnosis not present

## 2019-04-06 DIAGNOSIS — E7800 Pure hypercholesterolemia, unspecified: Secondary | ICD-10-CM | POA: Diagnosis not present

## 2019-04-06 DIAGNOSIS — R2689 Other abnormalities of gait and mobility: Secondary | ICD-10-CM | POA: Diagnosis not present

## 2019-04-06 DIAGNOSIS — R262 Difficulty in walking, not elsewhere classified: Secondary | ICD-10-CM | POA: Insufficient documentation

## 2019-04-06 LAB — BASIC METABOLIC PANEL: Creatinine: 1.4 — AB (ref <=1.1)

## 2019-04-06 LAB — HEMOGLOBIN A1C: Hemoglobin A1C: 7

## 2019-04-06 NOTE — Therapy (Signed)
[place] MAIN [facility] SERVICES [address] [place], [place], [address] Phone: [phone]   Fax:  [phone]  Physical Therapy Treatment  Patient Details  Name: Taylor Castaneda MRN: [id number] Date of Birth: Feb 26, 1933 Referring Provider (PT): [name]   Encounter Date: 04/06/2019  PT End of Session - 04/06/19 0814    Visit Number  3    Number of Visits  17    Date for PT Re-Evaluation  05/26/19    PT Start Time  0801    PT Stop Time  0842    PT Time Calculation (min)  41 min    Equipment Utilized During Treatment  Gait belt    Activity Tolerance  Patient tolerated treatment well    Behavior During Therapy  [facility] for tasks assessed/performed       Past Medical History:  Diagnosis Date  . Anemia   . Diabetes mellitus, type II ([place])   . Glaucoma    Dr.Hecker  . History of colon cancer 1998   Dr. Lennie Hummer  . Hyperlipemia   . Osteopenia 02/2012, 03/2014   DEXA hip -2.2  . Renal insufficiency   . Seizures ([place])   . Stenosing tenosynovitis of finger of left hand 2016   index - s/p steroid injection x2    Past Surgical History:  Procedure Laterality Date  . ABI  11/2013   WNL  . APPENDECTOMY    . CARPAL TUNNEL RELEASE     Bilateral   . CATARACT EXTRACTION Bilateral 2006   Implants  . COLECTOMY  1998   For cancer  . COLONOSCOPY  2008   Dr.Stark, Due 2011  . COLONOSCOPY  06/2013   1 hyperplastic polyp, ileocecal anastomosis [facility] Plan)  . dexa  02/2012, 03/2014   T score -2.2 at hip overall stable  . REFRACTIVE SURGERY    . TONSILLECTOMY AND ADENOIDECTOMY    . TOTAL KNEE ARTHROPLASTY Left 12/17/2016   Procedure: LEFT TOTAL KNEE ARTHROPLASTY;  Surgeon: Gaynelle Arabian, MD;  Location: [facility];  Service: Orthopedics;  Laterality: Left;    There were no vitals filed for this visit.  Subjective Assessment - 04/06/19 0814    Subjective  Pt reports doing well today; She does report having difficulty with tandem stance/walk with  HEP; Denies any new falls; Denies any pain;    Pertinent History  Pt is an 85 y/o female admitted to hospital on 03/05/19 after being found down in the floor by family members. MRI of the brain was negative for any acute findings. Neurology was consulted and suspect new onset seizures. Discharged from hospital on 03/07/19. PMH including but not limited to DM, HTN, glaucoma and colon cancer. Patient was independent with ADLs/IADLs prior to hospital admission with occasional use of cane for ambulation. Patient was able to perform bed mobility and transfers but unsteady with ambulation with 1 HHA x50 feet and gait deviation to right with decreased RLE step length, also demonstrated poor balance AEB standing balance Leahy scale score of 'poor'. Now living with her daughter, prior to admission was living alone.    How long can you sit comfortably?  2-3 hours    How long can you stand comfortably?  5-10 minutes    How long can you walk comfortably?  Able to walk around house about 8 times without SPC.    Diagnostic tests  MRI confirming absence of stroke    Patient Stated Goals  Wans to return to water aerobics and  walking normally.    Currently in Pain?  No/denies    Multiple Pain Sites  No                 TREATMENT:   Patient instructed in advanced balance exercise  Standing in parallel bars:  Standing on airex foam: -alternate toe taps to 4 inch step with 2-1 rail assist x15 reps bilaterally; -Standing one foot on airex, one foot on 4 inch step, BUE ball pass side/side x10 reps each foot on step -Heel/toe raises x15 reps with rail assist for balance -feet together, eyes open unsupported 30 sec hold x3 sets, eyes closed unsupported 10 sec hold x3 reps; Pt exhibits increased lean to right side and posterior loss of balance requiring min A to avoid loss of balance, especially with eyes closed;  -modified tandem stance: head turns side/side, up/down x5 reps each foot in front; Patient  required min VCs for balance stability, including to increase trunk control for less loss of balance with smaller base of support  Standing on step: Heel off step calf stretch 20 sec hold x2 reps bilaterally with B rail assist for safety;   Standing on 1/2 foam: (Flat side up) -heel/toe rocks with feet apart heel/toe rocks x15 with rail assist for safety -feet apart, BUE wand flexion x10 reps with CGA for safety and cues to improve ankle strategies for better stance control  Stepping over 1/2 bolser: Forward/backward with 2-0 rail assist x5 reps  Side step with 2-0 rail assist x5 reps each direction Required min VCs to increase hip flexion and increase step length for better foot clearance   Exercise: Standing with red tband around BLE: -hip flexion x10 bilaterally -hip extension x10 bilaterally; -hip abduction x10 bilaterally; -Side stepping x10 feet x3 laps each direction;  Patient required min-moderate verbal/tactile cues for correct exercise technique including cues to avoid trunk lean for better hip strengthening;       Response to treatment: Patient tolerated session well. She did report intermittent cramping in BLE calf; Instructed patient in calf stretches and provided a few rest breaks for comfort. Patient continues to require min A with advanced balance exercise especially with less rail assist when standing on compliant surfaces.  Advanced HEP with standing tband exercise, see patient instructions; Patient verbalized understanding;          PT Education - 04/06/19 0814    Education provided  Yes    Education Details  balance/HEP    Person(s) Educated  Patient;Child(ren)    Methods  Explanation;Verbal cues    Comprehension  Verbalized understanding;Returned demonstration;Verbal cues required;Need further instruction       PT Short Term Goals - 03/31/19 0086      PT SHORT TERM GOAL #1   Title  Patient will be independent in initial home exercise program to  improve strength/mobility for better functional independence with ADLs.    Time  4    Period  Weeks    Status  New    Target Date  04/28/19        PT Long Term Goals - 03/31/19 0929      PT LONG TERM GOAL #1   Title  Patient will be independent in finalized home exercise program to improve strength/mobility for better functional independence with ADLs.    Time  8    Period  Weeks    Status  New    Target Date  05/26/19      PT LONG TERM GOAL #2  Title  Patient will increase 10 meter walk test to >1.43m/s as to improve gait speed for better community ambulation and to reduce fall risk.    Baseline  03/31/19: 0.86 m/s;    Time  8    Period  Weeks    Status  New    Target Date  05/26/19      PT LONG TERM GOAL #3   Title  Patient will increase Berg Balance score by > 6 points to demonstrate decreased fall risk during functional activities.    Baseline  03/31/19: 46/56;    Time  8    Period  Weeks    Status  New    Target Date  05/26/19      PT LONG TERM GOAL #4   Title  Patient will increase six minute walk test distance to >1000 for progression to community ambulator and improve gait ability    Time  8    Period  Weeks    Status  New    Target Date  05/26/19            Plan - 04/06/19 1610    Clinical Impression Statement  Patient motivated and participated well within session; Instructed patient in advanced balance tasks. She did have difficulty standing on compliant surfaces especially with eyes closed and narrow base of support. She requires min A for safety to avoid loss of balance. Instructed patient in advanced LE strengthening utilizing red tband for resistance. She requires min VCs for correct exercise technique for optimal muscle strengthening. Patient would benefit from additional skilled PT intervention to improve strength, balance and gait safety;    Personal Factors and Comorbidities  Age;Comorbidity 3+    Comorbidities  HTN, DM, cancer (colon), hx of  siezure    Examination-Activity Limitations  Carry;Reach Overhead;Locomotion Level;Lift;Stairs;Stand    Examination-Participation Restrictions  Yard Work;Community Activity    Stability/Clinical Decision Making  Stable/Uncomplicated    Rehab Potential  Excellent    PT Frequency  2x / week    PT Duration  8 weeks    PT Treatment/Interventions  ADLs/Self Care Home Management;Aquatic Therapy;Moist Heat;DME Instruction;Gait training;Stair training;Functional mobility training;Therapeutic activities;Therapeutic exercise;Balance training;Neuromuscular re-education;Patient/family education;Manual techniques    PT Next Visit Plan  Static and dynamic balance activities, challenge SLS and step negotiation    PT Home Exercise Plan  Tandem stance hold, semi-tandem head turns, hip ext red TB    Consulted and Agree with Plan of Care  Patient;Family member/caregiver    Family Member Consulted  Daughter       Patient will benefit from skilled therapeutic intervention in order to improve the following deficits and impairments:  Abnormal gait, Decreased balance, Decreased endurance, Decreased mobility, Difficulty walking, Cardiopulmonary status limiting activity, Decreased strength, Decreased coordination, Decreased activity tolerance  Visit Diagnosis: Unsteadiness on feet  Other abnormalities of gait and mobility  Difficulty in walking, not elsewhere classified     Problem List Patient Active Problem List   Diagnosis Date Noted  . Seizure ([place]) 03/18/2019  . Transient atrial fibrillation ([place]) 03/18/2019  . MCI (mild cognitive impairment) 03/18/2019  . Status epilepticus ([place]) 03/05/2019  . Weight loss 01/28/2019  . Acute gout 04/29/2017  . OA (osteoarthritis) of knee 12/17/2016  . Dry skin dermatitis 06/13/2016  . Health maintenance examination 01/30/2016  . Medicare annual wellness visit, subsequent 03/10/2014  . Advanced care planning/counseling discussion 03/10/2014  . PAD (peripheral  artery disease) ([place]) 11/11/2013  . Dyspnea 09/18/2011  . PMR (polymyalgia rheumatica) (  [place]) 08/27/2011  . Controlled type 2 diabetes mellitus with diabetic nephropathy, with long-term current use of insulin ([place]) 08/04/2009  . Essential hypertension 06/13/2009  . CKD stage 3 due to type 2 diabetes mellitus ([place]) 01/24/2009  . Primary angle-closure glaucoma 11/13/2007  . Osteopenia 11/13/2007  . History of colon cancer 11/13/2007  . HLD (hyperlipidemia) 10/23/2006    Gaspard Isbell PT, DPT 04/06/2019, 8:42 AM  [place] MAIN [facility] SERVICES [address] [place], [place], [address] Phone: [phone]   Fax:  [phone]  Name: Taylor Castaneda MRN: [id number] Date of Birth: May 26, 1933

## 2019-04-06 NOTE — Patient Instructions (Signed)
Access Code: [email or web address]  URL: [email or web address]  Date: 04/06/2019  Prepared by: Blanche East   Exercises  Standing Hip Extension with Resistance at Ankles and Counter Support - 10 reps - 2 sets - 1x daily - 7x weekly  Standing Hip Abduction with Resistance at Ankles and Counter Support - 10 reps - 2 sets - 1x daily - 7x weekly  Standing Hip Flexion with Resistance at Ankles and Counter Support - 10 reps - 2 sets - 1x daily - 7x weekly  Side Stepping with Resistance at Thighs - 3 reps - 2 sets - 1x daily - 7x weekly

## 2019-04-08 ENCOUNTER — Encounter: Payer: Self-pay | Admitting: Physical Therapy

## 2019-04-08 ENCOUNTER — Other Ambulatory Visit: Payer: Self-pay

## 2019-04-08 ENCOUNTER — Ambulatory Visit: Payer: Medicare Other | Admitting: Physical Therapy

## 2019-04-08 DIAGNOSIS — R262 Difficulty in walking, not elsewhere classified: Secondary | ICD-10-CM | POA: Diagnosis not present

## 2019-04-08 DIAGNOSIS — R2689 Other abnormalities of gait and mobility: Secondary | ICD-10-CM

## 2019-04-08 DIAGNOSIS — R2681 Unsteadiness on feet: Secondary | ICD-10-CM

## 2019-04-08 NOTE — Therapy (Signed)
[place] MAIN [facility] SERVICES [address] [place], [place], [address] Phone: [phone]   Fax:  [phone]  Physical Therapy Treatment  Patient Details  Name: Taylor Castaneda MRN: [id number] Date of Birth: [birth year] Referring Provider (PT): [name]   Encounter Date: 04/08/2019  PT End of Session - 04/08/19 0939    Visit Number  4    Number of Visits  17    Date for PT Re-Evaluation  05/26/19    PT Start Time  0932    PT Stop Time  1015    PT Time Calculation (min)  43 min    Equipment Utilized During Treatment  Gait belt    Activity Tolerance  Patient tolerated treatment well    Behavior During Therapy  [facility] for tasks assessed/performed       Past Medical History:  Diagnosis Date  . Anemia   . Diabetes mellitus, type II ([place])   . Glaucoma    Dr.Hecker  . History of colon cancer 1998   Dr. Lennie Hummer  . Hyperlipemia   . Osteopenia 02/2012, 03/2014   DEXA hip -2.2  . Renal insufficiency   . Seizures ([place])   . Stenosing tenosynovitis of finger of left hand 2016   index - s/p steroid injection x2    Past Surgical History:  Procedure Laterality Date  . ABI  11/2013   WNL  . APPENDECTOMY    . CARPAL TUNNEL RELEASE     Bilateral   . CATARACT EXTRACTION Bilateral 2006   Implants  . COLECTOMY  1998   For cancer  . COLONOSCOPY  2008   Dr.Stark, Due 2011  . COLONOSCOPY  06/2013   1 hyperplastic polyp, ileocecal anastomosis [facility])  . dexa  02/2012, 03/2014   T score -2.2 at hip overall stable  . REFRACTIVE SURGERY    . TONSILLECTOMY AND ADENOIDECTOMY    . TOTAL KNEE ARTHROPLASTY Left 12/17/2016   Procedure: LEFT TOTAL KNEE ARTHROPLASTY;  Surgeon: Gaynelle Arabian, MD;  Location: [facility];  Service: Orthopedics;  Laterality: Left;    There were no vitals filed for this visit.  Subjective Assessment - 04/08/19 0938    Subjective  Patient reports doing well today; denies any pain; Reports that her balance is "ok". She  denies any new falls or changes;    Pertinent History  Pt is an 85 y/o female admitted to hospital on 03/05/19 after being found down in the floor by family members. MRI of the brain was negative for any acute findings. Neurology was consulted and suspect new onset seizures. Discharged from hospital on 03/07/19. PMH including but not limited to DM, HTN, glaucoma and colon cancer. Patient was independent with ADLs/IADLs prior to hospital admission with occasional use of cane for ambulation. Patient was able to perform bed mobility and transfers but unsteady with ambulation with 1 HHA x50 feet and gait deviation to right with decreased RLE step length, also demonstrated poor balance AEB standing balance Leahy scale score of 'poor'. Now living with her daughter, prior to admission was living alone.    How long can you sit comfortably?  2-3 hours    How long can you stand comfortably?  5-10 minutes    How long can you walk comfortably?  Able to walk around house about 8 times without SPC.    Diagnostic tests  MRI confirming absence of stroke    Patient Stated Goals  Wans to return to water aerobics and walking  normally.    Currently in Pain?  No/denies    Multiple Pain Sites  No                TREATMENT: Warm up on Nustep BUE/BLE level 2 x4 min (unbilled);              Patient instructed in advanced balance exercise  Standing in parallel bars:  Standing on airex foam: -alternate toe taps to 4 inch step with 1-0 rail assist x15 reps bilaterally; -Standing one foot on airex, one foot on 4 inch step, BUE ball pass side/side x5 reps each foot on step with increased difficulty noted requiring min A for safety and cues for weight shift for better stance control;  -Heel/toe raises x15 reps without rail assist to challenge balance, decreased AROM noted due to weakness and imbalance;  Standing on foam pad: -side step up to 6 inch step and back to foam with 2-1-0 rail assist x10 reps each LE  leading with CGA for safety and mod VCs to increase step length for better foot clearance;  Standing on airex: -feet together, eyes open unsupported 30 sec hold x3 sets, eyes closed unsupported 15 sec hold x3 reps; Pt exhibits increased lean to right side and posterior loss of balance requiring min A to avoid loss of balance, especially with eyes closed;  -modified tandem stance unsupported with eyes open 15 sec hold x1 rep each foot in front;  -modified tandem stance: head turns side/side,  x5 reps each foot in front; Patient required min VCs for balance stability, including to increase trunk control for less loss of balance with smaller base of support  Standing on step: Heel off step calf stretch 20 sec hold x2 reps bilaterally with B rail assist for safety; Requires min VCs for proper positioning for optimal stretching;    Stepping over orange hurdles #2, reciprocally: Forward  with 2  rail assist x4 reps, after 4th repetition, PT noticed that patient was dragging her left arm on the parallel bars; She denies any pain or numbness but does report fatigue; BP assessed: 110/50, pt denies any dizziness just reports fatigue;   Side step over orange hurdle #2, with 2 HHA  x5 reps each direction Required min VCs to increase hip flexion and increase step length for better foot clearance     Response to treatment: Patient tolerated session well. She did report intermittent cramping in BLE calf; Instructed patient in calf stretches and provided a few rest breaks for comfort. Patient continues to require min A with advanced balance exercise especially with less rail assist when standing on compliant surfaces.  Reports moderate fatigue at end of session;               PT Education - 04/08/19 0939    Education provided  Yes    Education Details  balance/strengthening, HEP    Person(s) Educated  Patient    Methods  Explanation;Verbal cues    Comprehension  Verbalized  understanding;Returned demonstration;Verbal cues required;Need further instruction       PT Short Term Goals - 03/31/19 0928      PT SHORT TERM GOAL #1   Title  Patient will be independent in initial home exercise program to improve strength/mobility for better functional independence with ADLs.    Time  4    Period  Weeks    Status  New    Target Date  04/28/19        PT Long Term  Goals - 03/31/19 0929      PT LONG TERM GOAL #1   Title  Patient will be independent in finalized home exercise program to improve strength/mobility for better functional independence with ADLs.    Time  8    Period  Weeks    Status  New    Target Date  05/26/19      PT LONG TERM GOAL #2   Title  Patient will increase 10 meter walk test to >1.33m/s as to improve gait speed for better community ambulation and to reduce fall risk.    Baseline  03/31/19: 0.86 m/s;    Time  8    Period  Weeks    Status  New    Target Date  05/26/19      PT LONG TERM GOAL #3   Title  Patient will increase Berg Balance score by > 6 points to demonstrate decreased fall risk during functional activities.    Baseline  03/31/19: 46/56;    Time  8    Period  Weeks    Status  New    Target Date  05/26/19      PT LONG TERM GOAL #4   Title  Patient will increase six minute walk test distance to >1000 for progression to community ambulator and improve gait ability    Time  8    Period  Weeks    Status  New    Target Date  05/26/19            Plan - 04/08/19 1019    Clinical Impression Statement  Patient motivated and participated well within session; She was instructed in advanced balance exercise. Patient continues to have increased difficulty with reducing rail assist requiring min A especially while on compliant surfaces. She exhibits increased sway and loss of balance when standing with eyes closed. Patient requires min VCs for proper exercise technique and sequencing. She did have increased fatigue with  prolonged standing requiring intermittent rest breaks. Patient would benefit from additional skilled PT intervention to improve strength, balance and gait safety;    Personal Factors and Comorbidities  Age;Comorbidity 3+    Comorbidities  HTN, DM, cancer (colon), hx of siezure    Examination-Activity Limitations  Carry;Reach Overhead;Locomotion Level;Lift;Stairs;Stand    Examination-Participation Restrictions  Yard Work;Community Activity    Stability/Clinical Decision Making  Stable/Uncomplicated    Rehab Potential  Excellent    PT Frequency  2x / week    PT Duration  8 weeks    PT Treatment/Interventions  ADLs/Self Care Home Management;Aquatic Therapy;Moist Heat;DME Instruction;Gait training;Stair training;Functional mobility training;Therapeutic activities;Therapeutic exercise;Balance training;Neuromuscular re-education;Patient/family education;Manual techniques    PT Next Visit Plan  Static and dynamic balance activities, challenge SLS and step negotiation    PT Home Exercise Plan  Tandem stance hold, semi-tandem head turns, hip ext red TB    Consulted and Agree with Plan of Care  Patient;Family member/caregiver    Family Member Consulted  Daughter       Patient will benefit from skilled therapeutic intervention in order to improve the following deficits and impairments:  Abnormal gait, Decreased balance, Decreased endurance, Decreased mobility, Difficulty walking, Cardiopulmonary status limiting activity, Decreased strength, Decreased coordination, Decreased activity tolerance  Visit Diagnosis: Unsteadiness on feet  Other abnormalities of gait and mobility  Difficulty in walking, not elsewhere classified     Problem List Patient Active Problem List   Diagnosis Date Noted  . Seizure ([place]) 03/18/2019  . Transient atrial fibrillation ([place])  03/18/2019  . MCI (mild cognitive impairment) 03/18/2019  . Status epilepticus ([place]) 03/05/2019  . Weight loss 01/28/2019  . Acute gout  04/29/2017  . OA (osteoarthritis) of knee 12/17/2016  . Dry skin dermatitis 06/13/2016  . Health maintenance examination 01/30/2016  . Medicare annual wellness visit, subsequent 03/10/2014  . Advanced care planning/counseling discussion 03/10/2014  . PAD (peripheral artery disease) ([place]) 11/11/2013  . Dyspnea 09/18/2011  . PMR (polymyalgia rheumatica) ([place]) 08/27/2011  . Controlled type 2 diabetes mellitus with diabetic nephropathy, with long-term current use of insulin ([place]) 08/04/2009  . Essential hypertension 06/13/2009  . CKD stage 3 due to type 2 diabetes mellitus ([place]) 01/24/2009  . Primary angle-closure glaucoma 11/13/2007  . Osteopenia 11/13/2007  . History of colon cancer 11/13/2007  . HLD (hyperlipidemia) 10/23/2006    Trotter,Margaret PT, DPT 04/08/2019, 10:24 AM  [place] MAIN [facility] SERVICES [address] [place], [place], [address] Phone: [phone]   Fax:  [phone]  Name: Taylor Castaneda MRN: [id number] Date of Birth: 07/05/1932

## 2019-04-13 DIAGNOSIS — R809 Proteinuria, unspecified: Secondary | ICD-10-CM | POA: Diagnosis not present

## 2019-04-13 DIAGNOSIS — E1165 Type 2 diabetes mellitus with hyperglycemia: Secondary | ICD-10-CM | POA: Diagnosis not present

## 2019-04-13 DIAGNOSIS — E7800 Pure hypercholesterolemia, unspecified: Secondary | ICD-10-CM | POA: Diagnosis not present

## 2019-04-13 DIAGNOSIS — I10 Essential (primary) hypertension: Secondary | ICD-10-CM | POA: Diagnosis not present

## 2019-04-15 ENCOUNTER — Ambulatory Visit: Payer: Medicare Other | Admitting: Family Medicine

## 2019-04-16 ENCOUNTER — Other Ambulatory Visit: Payer: Self-pay

## 2019-04-16 ENCOUNTER — Encounter: Payer: Self-pay | Admitting: Physical Therapy

## 2019-04-16 ENCOUNTER — Ambulatory Visit: Payer: Medicare Other | Admitting: Physical Therapy

## 2019-04-16 DIAGNOSIS — R2689 Other abnormalities of gait and mobility: Secondary | ICD-10-CM

## 2019-04-16 DIAGNOSIS — R262 Difficulty in walking, not elsewhere classified: Secondary | ICD-10-CM

## 2019-04-16 DIAGNOSIS — R2681 Unsteadiness on feet: Secondary | ICD-10-CM

## 2019-04-16 NOTE — Therapy (Signed)
[place] MAIN [facility] SERVICES [address] [place], [place], [address] Phone: [phone]   Fax:  [phone]  Physical Therapy Treatment  Patient Details  Name: Taylor Castaneda MRN: [id number] Date of Birth: Sep 05, 1932 Referring Provider (PT): [name]   Encounter Date: 04/16/2019  PT End of Session - 04/16/19 0846    Visit Number  5    Number of Visits  17    Date for PT Re-Evaluation  05/26/19    PT Start Time  0804    PT Stop Time  0845    PT Time Calculation (min)  41 min    Equipment Utilized During Treatment  Gait belt    Activity Tolerance  Patient tolerated treatment well    Behavior During Therapy  [facility] for tasks assessed/performed       Past Medical History:  Diagnosis Date  . Anemia   . Diabetes mellitus, type II ([place])   . Glaucoma    Dr.Hecker  . History of colon cancer 1998   Dr. Lennie Hummer  . Hyperlipemia   . Osteopenia 02/2012, 03/2014   DEXA hip -2.2  . Renal insufficiency   . Seizures ([place])   . Stenosing tenosynovitis of finger of left hand 2016   index - s/p steroid injection x2    Past Surgical History:  Procedure Laterality Date  . ABI  11/2013   WNL  . APPENDECTOMY    . CARPAL TUNNEL RELEASE     Bilateral   . CATARACT EXTRACTION Bilateral 2006   Implants  . COLECTOMY  1998   For cancer  . COLONOSCOPY  2008   Dr.Stark, Due 2011  . COLONOSCOPY  06/2013   1 hyperplastic polyp, ileocecal anastomosis [facility])  . dexa  02/2012, 03/2014   T score -2.2 at hip overall stable  . REFRACTIVE SURGERY    . TONSILLECTOMY AND ADENOIDECTOMY    . TOTAL KNEE ARTHROPLASTY Left 12/17/2016   Procedure: LEFT TOTAL KNEE ARTHROPLASTY;  Surgeon: Gaynelle Arabian, MD;  Location: [facility];  Service: Orthopedics;  Laterality: Left;    There were no vitals filed for this visit.  Subjective Assessment - 04/16/19 0812    Subjective  Patient reports doing okay. She had a fall last week and bruised the right side of her  face. She reports she was getting up out of a chair and fell forward. She reports adherence to HEP with some improvement. She is still walking outside when the weather permits.    Pertinent History  Pt is an 85 y/o female admitted to hospital on 03/05/19 after being found down in the floor by family members. MRI of the brain was negative for any acute findings. Neurology was consulted and suspect new onset seizures. Discharged from hospital on 03/07/19. PMH including but not limited to DM, HTN, glaucoma and colon cancer. Patient was independent with ADLs/IADLs prior to hospital admission with occasional use of cane for ambulation. Patient was able to perform bed mobility and transfers but unsteady with ambulation with 1 HHA x50 feet and gait deviation to right with decreased RLE step length, also demonstrated poor balance AEB standing balance Leahy scale score of 'poor'. Now living with her daughter, prior to admission was living alone.    How long can you sit comfortably?  2-3 hours    How long can you stand comfortably?  5-10 minutes    How long can you walk comfortably?  Able to walk around house about 8 times without  SPC.    Diagnostic tests  MRI confirming absence of stroke    Patient Stated Goals  Wans to return to water aerobics and walking normally.    Currently in Pain?  No/denies    Multiple Pain Sites  No               TREATMENT: Gait in hallway: x200 feet forward walking with cues to increase speed x200 feet with horizontal head turns x200 feet with vertical head turns Required close supervision for safety and cues to increase step length and improve gait speed for better dynamic balance control;   Patient instructed in advanced balance exercise  Standing in parallel bars:  Standing on 1/2 foam: (Flat side up) -heel/toe rocks with feet apart heel/toe rocks x15 with rail assist for safety -feet apart, BUE wand flexion x10 reps with CGA for safety and cues to  improve ankle strategies for better stance control -tandem stance with 2-0 rail assist 10 sec hold x3 reps each foot in front with CGA to min A for safety and cues to improve erect posture and increase weight shift for better stance control   Advanced HEP: Standing on firm surface: Heel/toe raises x10 reps with rail assist Progressed to heel raises 3 sec hold with 1 HHA x10 reps with cues for weight shift and to utilize rail assist as needed for balance.  Standing alternate march with rail assist x5 reps Progressed to slow march with 3 sec hold to challenge SLS x10 reps each LE with 1 rail assist;  Patient required cues for proper weight shift and exercise technique. She did have increased difficulty slowing exercise down requiring rail assist for balance;  Forward/backward walking x10 feet x2 laps with 1 rail assist for safety with cues to increase step length for better dynamic balance control, supervision for safety;   Exercise Leg press: BLE plate 75# [id number] with min VCs for proper positioning and to slow down LE movement for better motor control; Leg press BLE plate 75# heel raises 2x12 with min VCs for positioning and sequencing for better muscle strengthening;   Standing with red tband around BLE: Side stepping x10 feet x2 laps each direction;    Response to treatment: Patient tolerated session well. She did report intermittent cramping in BLE calf; Instructed patient in calf stretches and provided a few rest breaks for comfort. Patient continues to require min A-CGA with advanced balance exercise especially with less rail assist when standing on compliant surfaces.  Reports moderate fatigue at end of session; Advanced HEP- see patient instructions;                   PT Education - 04/16/19 0846    Education provided  Yes    Education Details  balance, strengthening, HEP    Person(s) Educated  Patient    Methods  Explanation;Verbal cues    Comprehension   Verbalized understanding;Returned demonstration;Verbal cues required;Need further instruction       PT Short Term Goals - 03/31/19 0277      PT SHORT TERM GOAL #1   Title  Patient will be independent in initial home exercise program to improve strength/mobility for better functional independence with ADLs.    Time  4    Period  Weeks    Status  New    Target Date  04/28/19        PT Long Term Goals - 03/31/19 0929      PT LONG TERM GOAL #1  Title  Patient will be independent in finalized home exercise program to improve strength/mobility for better functional independence with ADLs.    Time  8    Period  Weeks    Status  New    Target Date  05/26/19      PT LONG TERM GOAL #2   Title  Patient will increase 10 meter walk test to >1.27m/s as to improve gait speed for better community ambulation and to reduce fall risk.    Baseline  03/31/19: 0.86 m/s;    Time  8    Period  Weeks    Status  New    Target Date  05/26/19      PT LONG TERM GOAL #3   Title  Patient will increase Berg Balance score by > 6 points to demonstrate decreased fall risk during functional activities.    Baseline  03/31/19: 46/56;    Time  8    Period  Weeks    Status  New    Target Date  05/26/19      PT LONG TERM GOAL #4   Title  Patient will increase six minute walk test distance to >1000 for progression to community ambulator and improve gait ability    Time  8    Period  Weeks    Status  New    Target Date  05/26/19            Plan - 04/16/19 0846    Clinical Impression Statement  Patient motivated and participated well within session. Instructed patient in advanced LE strengthneing utilizing leg press and resistance band for strengthening. She does require min VCs for proper positioning and exercise technique for optimal strengthening. Advanced balance exercise, instructing patient in slow movement to challenge stance control. Advanced HEP, see patient instructions. Patient continues to  require CGA to min A with advanced balance exercise. She would benefit from additional skilled PT intervention to improve strength, balance and gait safety;    Personal Factors and Comorbidities  Age;Comorbidity 3+    Comorbidities  HTN, DM, cancer (colon), hx of siezure    Examination-Activity Limitations  Carry;Reach Overhead;Locomotion Level;Lift;Stairs;Stand    Examination-Participation Restrictions  Yard Work;Community Activity    Stability/Clinical Decision Making  Stable/Uncomplicated    Rehab Potential  Excellent    PT Frequency  2x / week    PT Duration  8 weeks    PT Treatment/Interventions  ADLs/Self Care Home Management;Aquatic Therapy;Moist Heat;DME Instruction;Gait training;Stair training;Functional mobility training;Therapeutic activities;Therapeutic exercise;Balance training;Neuromuscular re-education;Patient/family education;Manual techniques    PT Next Visit Plan  Static and dynamic balance activities, challenge SLS and step negotiation    PT Home Exercise Plan  Tandem stance hold, semi-tandem head turns, hip ext red TB    Consulted and Agree with Plan of Care  Patient;Family member/caregiver    Family Member Consulted  Daughter       Patient will benefit from skilled therapeutic intervention in order to improve the following deficits and impairments:  Abnormal gait, Decreased balance, Decreased endurance, Decreased mobility, Difficulty walking, Cardiopulmonary status limiting activity, Decreased strength, Decreased coordination, Decreased activity tolerance  Visit Diagnosis: Unsteadiness on feet  Other abnormalities of gait and mobility  Difficulty in walking, not elsewhere classified     Problem List Patient Active Problem List   Diagnosis Date Noted  . Seizure ([place]) 03/18/2019  . Transient atrial fibrillation ([place]) 03/18/2019  . MCI (mild cognitive impairment) 03/18/2019  . Status epilepticus ([place]) 03/05/2019  . Weight loss 01/28/2019  .  Acute gout 04/29/2017   . OA (osteoarthritis) of knee 12/17/2016  . Dry skin dermatitis 06/13/2016  . Health maintenance examination 01/30/2016  . Medicare annual wellness visit, subsequent 03/10/2014  . Advanced care planning/counseling discussion 03/10/2014  . PAD (peripheral artery disease) ([place]) 11/11/2013  . Dyspnea 09/18/2011  . PMR (polymyalgia rheumatica) ([place]) 08/27/2011  . Controlled type 2 diabetes mellitus with diabetic nephropathy, with long-term current use of insulin ([place]) 08/04/2009  . Essential hypertension 06/13/2009  . CKD stage 3 due to type 2 diabetes mellitus ([place]) 01/24/2009  . Primary angle-closure glaucoma 11/13/2007  . Osteopenia 11/13/2007  . History of colon cancer 11/13/2007  . HLD (hyperlipidemia) 10/23/2006    Niv Darley PT, DPT 04/16/2019, 8:48 AM  [place] MAIN [facility] SERVICES [address] [place], [place], [address] Phone: [phone]   Fax:  [phone]  Name: SIREEN HALK MRN: [id number] Date of Birth: 04/06/1933

## 2019-04-16 NOTE — Patient Instructions (Signed)
Access Code: [email or web address]  URL: [email or web address]  Date: 04/16/2019  Prepared by: Blanche East   Exercises  Heel rises with counter support - 10 reps - 1 sets - 1x daily - 7x weekly  Standing Heel Raise with Toes Turned Out - 10 reps - 2 sets - 1x daily - 7x weekly  Standing March with Counter Support - 10 reps - 2 sets - 1x daily - 7x weekly  Side Stepping with Resistance at Ankles and Counter Support - 5 reps - 2 sets - 1x daily - 7x weekly  Forward and Backward Walking with Eyes Closed and Counter Support - 5 reps - 2 sets - 1x daily - 7x weekly

## 2019-04-20 ENCOUNTER — Encounter: Payer: Self-pay | Admitting: Family Medicine

## 2019-04-21 ENCOUNTER — Encounter: Payer: Self-pay | Admitting: Family Medicine

## 2019-04-21 ENCOUNTER — Ambulatory Visit (INDEPENDENT_AMBULATORY_CARE_PROVIDER_SITE_OTHER): Payer: Medicare Other | Admitting: Family Medicine

## 2019-04-21 ENCOUNTER — Other Ambulatory Visit: Payer: Self-pay

## 2019-04-21 VITALS — BP 134/62 | HR 61 | Temp 97.6°F | Ht 60.0 in | Wt 117.5 lb

## 2019-04-21 DIAGNOSIS — G40901 Epilepsy, unspecified, not intractable, with status epilepticus: Secondary | ICD-10-CM | POA: Diagnosis not present

## 2019-04-21 DIAGNOSIS — N183 Chronic kidney disease, stage 3 unspecified: Secondary | ICD-10-CM

## 2019-04-21 DIAGNOSIS — E1122 Type 2 diabetes mellitus with diabetic chronic kidney disease: Secondary | ICD-10-CM

## 2019-04-21 DIAGNOSIS — I10 Essential (primary) hypertension: Secondary | ICD-10-CM | POA: Diagnosis not present

## 2019-04-21 DIAGNOSIS — E1121 Type 2 diabetes mellitus with diabetic nephropathy: Secondary | ICD-10-CM | POA: Diagnosis not present

## 2019-04-21 DIAGNOSIS — Z794 Long term (current) use of insulin: Secondary | ICD-10-CM

## 2019-04-21 NOTE — Progress Notes (Signed)
This visit was conducted in person.  BP 134/62 (BP Location: Left Arm, Patient Position: Sitting, Cuff Size: Normal)   Pulse 61   Temp 97.6 F (36.4 C) (Temporal)   Ht 5' (1.524 m)   Wt 117 lb 8 oz (53.3 kg)   SpO2 99%   BMI 22.95 kg/m    CC: HTN f/u visit Subjective:    Patient ID: Taylor Castaneda, female    DOB: 07/16/1932, 85 y.o.   MRN: [id number]  HPI: Taylor Castaneda is a 85 y.o. female presenting on 04/21/2019 for Hypertension (Here for 1 mo f/u.  Pt accompanied by daughter, [name] (temp, 97.9).)   Daughter brings BP log - 108-150/50-70s.   HTN - Compliant with current antihypertensive regimen of carvedilol 25mg  bid and ramipril 10mg  daily as well as lasix 20mg  daily. Does check blood pressures at home: brings log (see above). No low blood pressure readings or symptoms of dizziness/syncope. Denies HA, vision changes, CP/tightness, SOB, leg swelling.  She had a fall Thursday Nov 5th after getting up too quickly.      Relevant past medical, surgical, family and social history reviewed and updated as indicated. Interim medical history since our last visit reviewed. Allergies and medications reviewed and updated. Outpatient Medications Prior to Visit  Medication Sig Dispense Refill  . aspirin 81 MG EC tablet Take 1 tablet (81 mg total) by mouth daily. Swallow whole. 30 tablet 0  . atorvastatin (LIPITOR) 40 MG tablet Take 1 tablet (40 mg total) by mouth daily at 6 PM. 1 tablet 0  . B-D ULTRAFINE III SHORT PEN 31G X 8 MM MISC USE AS DIRECTED WITH INSULIN 100 each 3  . Biotin 10000 MCG TABS Take 1 tablet by mouth daily.    . Carboxymethylcellul-Glycerin (CLEAR EYES FOR DRY EYES) 1-0.25 % SOLN Place 1-2 drops into both eyes 3 (three) times daily as needed (for dry/irritated eyes.).    Marland Kitchen carvedilol (COREG) 25 MG tablet Take 1 tablet (25 mg total) by mouth 2 (two) times daily. 180 tablet 3  . Cholecalciferol (VITAMIN D3) 25 MCG (1000 UT) CAPS Take 1 capsule (1,000 Units total) by mouth  daily. 30 capsule   . Cinnamon 500 MG capsule Take 500 mg by mouth daily.    . colchicine 0.6 MG tablet Take 1 tablet (0.6 mg total) by mouth daily as needed (gout flare). First day of gout flare, may take 1 tablet twice daily. 30 tablet 1  . Ferrous Sulfate (IRON) 325 (65 Fe) MG TABS Take 1 tablet by mouth 2 (two) times a week. Takes Mon and [employer]    . furosemide (LASIX) 40 MG tablet Take 0.5 tablets (20 mg total) by mouth daily. 45 tablet 3  . glucose blood (FREESTYLE LITE) test strip Use as instructed (Patient taking differently: 1 each by Other route 4 (four) times daily. Use as instructed) 100 each 2  . HUMALOG KWIKPEN 100 UNIT/ML KiwkPen Inject 1-10 Units into the skin 3 (three) times daily before meals. Sliding scale  4  . LANTUS SOLOSTAR 100 UNIT/ML Solostar Pen Inject 30 Units into the skin at bedtime.    . levETIRAcetam (KEPPRA) 250 MG tablet Take 1 tablet (250 mg total) by mouth 2 (two) times daily. 180 tablet 4  . Multiple Vitamins-Minerals (PRESERVISION AREDS 2 PO) Take by mouth 2 (two) times daily.    . potassium chloride (K-DUR) 10 MEQ tablet TAKE 1  BY MOUTH ON MONDAY, WEDNESDAY AND FRIDAY (Patient taking differently: Take 10 mEq  by mouth every Monday, Wednesday, and Friday. ) 36 tablet 3  . ramipril (ALTACE) 10 MG capsule Take 1 capsule (10 mg total) by mouth daily. 90 capsule 3   No facility-administered medications prior to visit.      Per HPI unless specifically indicated in ROS section below Review of Systems Objective:    BP 134/62 (BP Location: Left Arm, Patient Position: Sitting, Cuff Size: Normal)   Pulse 61   Temp 97.6 F (36.4 C) (Temporal)   Ht 5' (1.524 m)   Wt 117 lb 8 oz (53.3 kg)   SpO2 99%   BMI 22.95 kg/m   Wt Readings from Last 3 Encounters:  04/21/19 117 lb 8 oz (53.3 kg)  03/25/19 119 lb (54 kg)  03/18/19 119 lb 5 oz (54.1 kg)    Physical Exam Vitals signs and nursing note reviewed.  Constitutional:      General: She is not in acute distress.     Appearance: Normal appearance. She is not ill-appearing.  Cardiovascular:     Rate and Rhythm: Normal rate and regular rhythm.     Pulses: Normal pulses.     Heart sounds: Normal heart sounds. No murmur.  Pulmonary:     Effort: Pulmonary effort is normal. No respiratory distress.     Breath sounds: Normal breath sounds. No wheezing, rhonchi or rales.  Musculoskeletal:     Right lower leg: No edema.     Left lower leg: No edema.  Neurological:     Mental Status: She is alert.  Psychiatric:        Mood and Affect: Mood normal.        Behavior: Behavior normal.       Results for orders placed or performed in visit on 78/46/96  Basic metabolic panel  Result Value Ref Range   Creatinine 1.4 (A) 0.5 - 1.1  Hemoglobin A1c  Result Value Ref Range   Hemoglobin A1C 7    Assessment & Plan:   Problem List Items Addressed This Visit    Status epilepticus ([place])    Sounds regular today.       Essential hypertension - Primary    Chronic, improved on current regimen - continue. Reassess in 4 mo f/u visit.       Controlled type 2 diabetes mellitus with diabetic nephropathy, with long-term current use of insulin ([place])    Appreciate endo care.       CKD stage 3 due to type 2 diabetes mellitus (HCC)    Stable period           No orders of the defined types were placed in this encounter.  No orders of the defined types were placed in this encounter.  Patient Instructions  You are doing well today - continue current blood pressures.  Good to see you today! Return in 4 months for follow up visit.    Follow up plan: Return in about 4 months (around 08/19/2019), or if symptoms worsen or fail to improve, for follow up visit.  [name], MD

## 2019-04-21 NOTE — Patient Instructions (Addendum)
You are doing well today - continue current blood pressures.  Good to see you today! Return in 4 months for follow up visit.

## 2019-04-21 NOTE — Assessment & Plan Note (Signed)
Appreciate endo care.

## 2019-04-21 NOTE — Assessment & Plan Note (Signed)
Stable period.  

## 2019-04-21 NOTE — Assessment & Plan Note (Signed)
Chronic, improved on current regimen - continue. Reassess in 4 mo f/u visit.

## 2019-04-21 NOTE — Assessment & Plan Note (Signed)
Sounds regular today.  ?

## 2019-04-22 ENCOUNTER — Encounter: Payer: Self-pay | Admitting: Physical Therapy

## 2019-04-22 ENCOUNTER — Ambulatory Visit: Payer: Medicare Other | Admitting: Physical Therapy

## 2019-04-22 DIAGNOSIS — R2681 Unsteadiness on feet: Secondary | ICD-10-CM | POA: Diagnosis not present

## 2019-04-22 DIAGNOSIS — R2689 Other abnormalities of gait and mobility: Secondary | ICD-10-CM | POA: Diagnosis not present

## 2019-04-22 DIAGNOSIS — R262 Difficulty in walking, not elsewhere classified: Secondary | ICD-10-CM | POA: Diagnosis not present

## 2019-04-22 NOTE — Therapy (Signed)
[place] MAIN [facility] SERVICES [address] [place], [place], [address] Phone: [phone]   Fax:  [phone]  Physical Therapy Treatment  Patient Details  Name: Taylor Castaneda MRN: [id number] Date of Birth: 02-Jun-1933 Referring Provider (PT): [name]   Encounter Date: 04/22/2019  PT End of Session - 04/22/19 0849    Visit Number  6    Number of Visits  17    Date for PT Re-Evaluation  05/26/19    PT Start Time  0847    PT Stop Time  0930    PT Time Calculation (min)  43 min    Equipment Utilized During Treatment  Gait belt    Activity Tolerance  Patient tolerated treatment well    Behavior During Therapy  [facility] for tasks assessed/performed       Past Medical History:  Diagnosis Date  . Anemia   . Diabetes mellitus, type II ([place])   . Glaucoma    Dr.Hecker  . History of colon cancer 1998   Dr. Lennie Hummer  . Hyperlipemia   . Osteopenia 02/2012, 03/2014   DEXA hip -2.2  . Renal insufficiency   . Seizures ([place])   . Stenosing tenosynovitis of finger of left hand 2016   index - s/p steroid injection x2    Past Surgical History:  Procedure Laterality Date  . ABI  11/2013   WNL  . APPENDECTOMY    . CARPAL TUNNEL RELEASE     Bilateral   . CATARACT EXTRACTION Bilateral 2006   Implants  . COLECTOMY  1998   For cancer  . COLONOSCOPY  2008   Dr.Stark, [place] 2011  . COLONOSCOPY  06/2013   1 hyperplastic polyp, ileocecal anastomosis [facility])  . dexa  02/2012, 03/2014   T score -2.2 at hip overall stable  . REFRACTIVE SURGERY    . TONSILLECTOMY AND ADENOIDECTOMY    . TOTAL KNEE ARTHROPLASTY Left 12/17/2016   Procedure: LEFT TOTAL KNEE ARTHROPLASTY;  Surgeon: Gaynelle Arabian, MD;  Location: [facility];  Service: Orthopedics;  Laterality: Left;    There were no vitals filed for this visit.  Subjective Assessment - 04/22/19 0856    Subjective  Patient reports doing okay. denies any new falls. Reports adherence with HEP with minimal  difficulty;    Pertinent History  Pt is an 85 y/o female admitted to hospital on 03/05/19 after being found down in the floor by family members. MRI of the brain was negative for any acute findings. Neurology was consulted and suspect new onset seizures. Discharged from hospital on 03/07/19. PMH including but not limited to DM, HTN, glaucoma and colon cancer. Patient was independent with ADLs/IADLs prior to hospital admission with occasional use of cane for ambulation. Patient was able to perform bed mobility and transfers but unsteady with ambulation with 1 HHA x50 feet and gait deviation to right with decreased RLE step length, also demonstrated poor balance AEB standing balance Leahy scale score of 'poor'. Now living with her daughter, prior to admission was living alone.    How long can you sit comfortably?  2-3 hours    How long can you stand comfortably?  5-10 minutes    How long can you walk comfortably?  Able to walk around house about 8 times without SPC.    Diagnostic tests  MRI confirming absence of stroke    Patient Stated Goals  Wans to return to water aerobics and walking normally.    Currently in  Pain?  No/denies    Multiple Pain Sites  No             TREATMENT: Gait in hallway: x200 feet forward walking with cues to increase speed x200 feet with horizontal head turns x200 feet with vertical head turns Required close supervision for safety and cues to increase step length and improve gait speed for better dynamic balance control;   Patient instructed in advanced balance exercise  Standing in parallel bars:  Standing on 1/2 foam: (Flat side up) -heel/toe rocks with feet apart heel/toe rocks x15 with rail assist for safety -feet apart, BUE wand flexion x10 reps with CGA for safety and cues to improve ankle strategies for better stance control  Standing on 1/2 foam (round side up) Incline standing with feet apart 2-0 rail assist 30 sec hold x2 sets with min  A for safety and cues for forward weight shift; Patient exhibit heavy posterior lean with decreased hip/ankle strategies for balance recovery; Progressed to incline standing with eyes closed unsupported 30 sec hold x1 set with min A for safety;  Stepping over orange hurdles (#2): 2# ankle weights Forward reciprocal x10 laps Side stepping #2 x5 laps each direction Required intermittent rail assist for safety;   Standing on airex beam: -tandem stance with 2-0 rail assist 10 sec hold x2 reps each foot in front; -side stepping with finger tip hold x5 laps each direction;  -feet together BUE ball pass side/side x10 reps with CGA for safety; -tandem gait with 1-0 rail assist x4 laps with CGA for safety Required CGA  For most balance exercise and cues for proper foot placement and weight shift for better stance control;   Exercise Leg press: BLE plate 75# [id number] with min VCs for proper positioning and to slow down LE movement for better motor control; Leg press BLE plate 75# heel raises 2x15 with min VCs for positioning and sequencing for better muscle strengthening;   Response to treatment: Patient tolerated session well. She was able to tolerate increased standing this session with less seated rest breaks. Patient does continue to have intermittent soreness in BLE which is relieved with standing/seated rest break. Patient requires CGA for most balance exercise. She had increased difficulty with incline standing with increased posterior loss of balance.                    PT Education - 04/22/19 0849    Education provided  Yes    Education Details  balance/strengthening, HEP    Person(s) Educated  Patient    Methods  Explanation;Verbal cues    Comprehension  Verbalized understanding;Returned demonstration;Verbal cues required;Need further instruction       PT Short Term Goals - 03/31/19 0928      PT SHORT TERM GOAL #1   Title  Patient will be independent in initial  home exercise program to improve strength/mobility for better functional independence with ADLs.    Time  4    Period  Weeks    Status  New    Target Date  04/28/19        PT Long Term Goals - 03/31/19 0929      PT LONG TERM GOAL #1   Title  Patient will be independent in finalized home exercise program to improve strength/mobility for better functional independence with ADLs.    Time  8    Period  Weeks    Status  New    Target Date  05/26/19  PT LONG TERM GOAL #2   Title  Patient will increase 10 meter walk test to >1.48m/s as to improve gait speed for better community ambulation and to reduce fall risk.    Baseline  03/31/19: 0.86 m/s;    Time  8    Period  Weeks    Status  New    Target Date  05/26/19      PT LONG TERM GOAL #3   Title  Patient will increase Berg Balance score by > 6 points to demonstrate decreased fall risk during functional activities.    Baseline  03/31/19: 46/56;    Time  8    Period  Weeks    Status  New    Target Date  05/26/19      PT LONG TERM GOAL #4   Title  Patient will increase six minute walk test distance to >1000 for progression to community ambulator and improve gait ability    Time  8    Period  Weeks    Status  New    Target Date  05/26/19            Plan - 04/22/19 3536    Clinical Impression Statement  Patient motivated and participated well within session. instructed patient in advanced balance exercise, utilizing compliant surfaces to challenge ankle strategies and stance control. Patient did exhibit decreased ankle strategies with increased posterior loss of balance especially with incline standing. She required CGA for most balance exercise with intermittent rail assist. Progressed LE strengthening with increased repetition. Reinforced HEP. patient would benefit from additional skilled PT Intervention to improve strength, balance and gait safety;    Personal Factors and Comorbidities  Age;Comorbidity 3+    Comorbidities   HTN, DM, cancer (colon), hx of siezure    Examination-Activity Limitations  Carry;Reach Overhead;Locomotion Level;Lift;Stairs;Stand    Examination-Participation Restrictions  Yard Work;Community Activity    Stability/Clinical Decision Making  Stable/Uncomplicated    Rehab Potential  Excellent    PT Frequency  2x / week    PT Duration  8 weeks    PT Treatment/Interventions  ADLs/Self Care Home Management;Aquatic Therapy;Moist Heat;DME Instruction;Gait training;Stair training;Functional mobility training;Therapeutic activities;Therapeutic exercise;Balance training;Neuromuscular re-education;Patient/family education;Manual techniques    PT Next Visit Plan  Static and dynamic balance activities, challenge SLS and step negotiation    PT Home Exercise Plan  Tandem stance hold, semi-tandem head turns, hip ext red TB    Consulted and Agree with Plan of Care  Patient;Family member/caregiver    Family Member Consulted  Daughter       Patient will benefit from skilled therapeutic intervention in order to improve the following deficits and impairments:  Abnormal gait, Decreased balance, Decreased endurance, Decreased mobility, Difficulty walking, Cardiopulmonary status limiting activity, Decreased strength, Decreased coordination, Decreased activity tolerance  Visit Diagnosis: Unsteadiness on feet  Other abnormalities of gait and mobility  Difficulty in walking, not elsewhere classified     Problem List Patient Active Problem List   Diagnosis Date Noted  . Seizure ([place]) 03/18/2019  . Transient atrial fibrillation ([place]) 03/18/2019  . MCI (mild cognitive impairment) 03/18/2019  . Status epilepticus ([place]) 03/05/2019  . Weight loss 01/28/2019  . Acute gout 04/29/2017  . OA (osteoarthritis) of knee 12/17/2016  . Dry skin dermatitis 06/13/2016  . Health maintenance examination 01/30/2016  . Medicare annual wellness visit, subsequent 03/10/2014  . Advanced care planning/counseling discussion  03/10/2014  . PAD (peripheral artery disease) ([place]) 11/11/2013  . Dyspnea 09/18/2011  . PMR (polymyalgia  rheumatica) ([place]) 08/27/2011  . Controlled type 2 diabetes mellitus with diabetic nephropathy, with long-term current use of insulin ([place]) 08/04/2009  . Essential hypertension 06/13/2009  . CKD stage 3 due to type 2 diabetes mellitus ([place]) 01/24/2009  . Primary angle-closure glaucoma 11/13/2007  . Osteopenia 11/13/2007  . History of colon cancer 11/13/2007  . HLD (hyperlipidemia) 10/23/2006    , PT, DPT 04/22/2019, 9:31 AM  [place] [facility] SERVICES [address] [place], [place], [address] Phone: [phone]   Fax:  [phone]  Name: Taylor Castaneda MRN: [id number] Date of Birth: 09/28/1932

## 2019-04-24 DIAGNOSIS — H04123 Dry eye syndrome of bilateral lacrimal glands: Secondary | ICD-10-CM | POA: Diagnosis not present

## 2019-04-24 DIAGNOSIS — H402232 Chronic angle-closure glaucoma, bilateral, moderate stage: Secondary | ICD-10-CM | POA: Diagnosis not present

## 2019-04-28 ENCOUNTER — Other Ambulatory Visit: Payer: Self-pay

## 2019-04-28 ENCOUNTER — Encounter: Payer: Self-pay | Admitting: Physical Therapy

## 2019-04-28 ENCOUNTER — Ambulatory Visit: Payer: Medicare Other | Admitting: Physical Therapy

## 2019-04-28 DIAGNOSIS — R2689 Other abnormalities of gait and mobility: Secondary | ICD-10-CM

## 2019-04-28 DIAGNOSIS — R2681 Unsteadiness on feet: Secondary | ICD-10-CM | POA: Diagnosis not present

## 2019-04-28 DIAGNOSIS — R262 Difficulty in walking, not elsewhere classified: Secondary | ICD-10-CM | POA: Diagnosis not present

## 2019-04-28 NOTE — Therapy (Signed)
[place] MAIN [facility] SERVICES [address] [place], [place], [address] Phone: [phone]   Fax:  [phone]  Physical Therapy Treatment  Patient Details  Name: Taylor Castaneda MRN: [id number] Date of Birth: [birth year] Referring Provider (PT): [name]   Encounter Date: 04/28/2019  PT End of Session - 04/28/19 0904    Visit Number  7    Number of Visits  17    Date for PT Re-Evaluation  05/26/19    PT Start Time  0851    PT Stop Time  0935    PT Time Calculation (min)  44 min    Equipment Utilized During Treatment  Gait belt    Activity Tolerance  Patient tolerated treatment well    Behavior During Therapy  [facility] for tasks assessed/performed       Past Medical History:  Diagnosis Date  . Anemia   . Diabetes mellitus, type II ([place])   . Glaucoma    Dr.Hecker  . History of colon cancer 1998   Dr. Lennie Hummer  . Hyperlipemia   . Osteopenia 02/2012, 03/2014   DEXA hip -2.2  . Renal insufficiency   . Seizures ([place])   . Stenosing tenosynovitis of finger of left hand 2016   index - s/p steroid injection x2    Past Surgical History:  Procedure Laterality Date  . ABI  11/2013   WNL  . APPENDECTOMY    . CARPAL TUNNEL RELEASE     Bilateral   . CATARACT EXTRACTION Bilateral 2006   Implants  . COLECTOMY  1998   For cancer  . COLONOSCOPY  2008   Dr.Stark, Due 2011  . COLONOSCOPY  06/2013   1 hyperplastic polyp, ileocecal anastomosis [facility])  . dexa  02/2012, 03/2014   T score -2.2 at hip overall stable  . REFRACTIVE SURGERY    . TONSILLECTOMY AND ADENOIDECTOMY    . TOTAL KNEE ARTHROPLASTY Left 12/17/2016   Procedure: LEFT TOTAL KNEE ARTHROPLASTY;  Surgeon: Gaynelle Arabian, MD;  Location: [facility];  Service: Orthopedics;  Laterality: Left;    There were no vitals filed for this visit.  Subjective Assessment - 04/28/19 0903    Subjective  Patient reports doing well; She reports still trying to walk outside daily but admits its  been difficult with weather. She denies any new falls;    Pertinent History  Pt is an 85 y/o female admitted to hospital on 03/05/19 after being found down in the floor by family members. MRI of the brain was negative for any acute findings. Neurology was consulted and suspect new onset seizures. Discharged from hospital on 03/07/19. PMH including but not limited to DM, HTN, glaucoma and colon cancer. Patient was independent with ADLs/IADLs prior to hospital admission with occasional use of cane for ambulation. Patient was able to perform bed mobility and transfers but unsteady with ambulation with 1 HHA x50 feet and gait deviation to right with decreased RLE step length, also demonstrated poor balance AEB standing balance Leahy scale score of 'poor'. Now living with her daughter, prior to admission was living alone.    How long can you sit comfortably?  2-3 hours    How long can you stand comfortably?  5-10 minutes    How long can you walk comfortably?  Able to walk around house about 8 times without SPC.    Diagnostic tests  MRI confirming absence of stroke    Patient Stated Goals  Wans to return to water  aerobics and walking normally.    Currently in Pain?  No/denies    Multiple Pain Sites  No         [facility] PT Assessment - 04/28/19 0001      6 minute walk test results    Aerobic Endurance Distance Walked  775      Berg Balance Test   Sit to Stand  Able to stand without using hands and stabilize independently    Standing Unsupported  Able to stand safely 2 minutes    Sitting with Back Unsupported but Feet Supported on Floor or Stool  Able to sit safely and securely 2 minutes    Stand to Sit  Sits safely with minimal use of hands    Transfers  Able to transfer safely, minor use of hands    Standing Unsupported with Eyes Closed  Able to stand 10 seconds safely    Standing Unsupported with Feet Together  Able to place feet together independently and stand 1 minute safely    From Standing,  Reach Forward with Outstretched Arm  Can reach confidently >25 cm (10")    From Standing Position, Pick up Object from Floor  Able to pick up shoe safely and easily    From Standing Position, Turn to Look Behind Over each Shoulder  Looks behind from both sides and weight shifts well    Turn 360 Degrees  Able to turn 360 degrees safely but slowly    Standing Unsupported, Alternately Place Feet on Step/Stool  Able to stand independently and safely and complete 8 steps in 20 seconds    Standing Unsupported, One Foot in Front  Able to plae foot ahead of the other independently and hold 30 seconds    Standing on One Leg  Tries to lift leg/unable to hold 3 seconds but remains standing independently    Total Score  50    Berg comment:  50% risk for falls, improved from 46/56 on 03/31/19            OUTCOME MEASURES: TEST Outcome (03/31/19)  Outcome 04/28/19 Interpretation  5 times sit<>stand 12.02 sec  >60 yo, >15 sec indicates increased risk for falls  10 meter walk test        0.86  m/s  0.89 m/s without AD <1.0 m/s indicates increased risk for falls; limited community ambulator  Timed up and Go       13.2   sec  <14 sec indicates increased risk for falls  Berg Balance Assessment 46/56   50/56 <36/56 (100% risk for falls), 37-45 (80% risk for falls); 46-51 (>50% risk for falls); 52-55 (lower risk <25% of falls)        TREATMENT: Gait in hallway: x200 feet forward walking with cues to increase speed x200 feet with horizontal head turns x200 feet with vertical head turns Required close supervision for safety and cues to increase step length and improve gait speed for better dynamic balance control;When walking with head turns patient often slows down with decreased foot clearance;  Patient reports increased BLE calf pain with prolonged ambulation requiring short seated rest breaks. Reinforced HEP.                PT Short Term Goals - 04/28/19 0905      PT SHORT TERM GOAL  #1   Title  Patient will be independent in initial home exercise program to improve strength/mobility for better functional independence with ADLs.    Time  4  Period  Weeks    Status  Achieved    Target Date  04/28/19        PT Long Term Goals - 04/28/19 0905      PT LONG TERM GOAL #1   Title  Patient will be independent in finalized home exercise program to improve strength/mobility for better functional independence with ADLs.    Time  8    Period  Weeks    Status  Partially Met    Target Date  05/26/19      PT LONG TERM GOAL #2   Title  Patient will increase 10 meter walk test to >1.46ms as to improve gait speed for better community ambulation and to reduce fall risk.    Baseline  03/31/19: 0.86 m/s;    Time  8    Period  Weeks    Status  Partially Met    Target Date  05/26/19      PT LONG TERM GOAL #3   Title  Patient will increase Berg Balance score by > 6 points to demonstrate decreased fall risk during functional activities.    Baseline  03/31/19: 46/56;    Time  8    Period  Weeks    Status  Partially Met    Target Date  05/26/19      PT LONG TERM GOAL #4   Title  Patient will increase six minute walk test distance to >1000 for progression to community ambulator and improve gait ability    Time  8    Period  Weeks    Status  Partially Met    Target Date  05/26/19            Plan - 04/28/19 0947    Clinical Impression Statement  Patient motivated and participated well within session. She has made improvements in her balance and is able to walk short distances mod I without AD. Patient does continue to have slower gait speed making her a limited community ambulator and leading to a slight risk for falls. She reports increased BLE calf cramping with prolonged ambulation which is relieved with rest. Patient reports speaking with her doctor about this who recommended she drink more water. She has had sonogram done of BLE with normal results. Patient would  benefit from additional skilled PT intervention to improve strength, balance and mobility;    Personal Factors and Comorbidities  Age;Comorbidity 3+    Comorbidities  HTN, DM, cancer (colon), hx of siezure    Examination-Activity Limitations  Carry;Reach Overhead;Locomotion Level;Lift;Stairs;Stand    Examination-Participation Restrictions  Yard Work;Community Activity    Stability/Clinical Decision Making  Stable/Uncomplicated    Rehab Potential  Excellent    PT Frequency  2x / week    PT Duration  8 weeks    PT Treatment/Interventions  ADLs/Self Care Home Management;Aquatic Therapy;Moist Heat;DME Instruction;Gait training;Stair training;Functional mobility training;Therapeutic activities;Therapeutic exercise;Balance training;Neuromuscular re-education;Patient/family education;Manual techniques    PT Next Visit Plan  Static and dynamic balance activities, challenge SLS and step negotiation    PT Home Exercise Plan  Tandem stance hold, semi-tandem head turns, hip ext red TB    Consulted and Agree with Plan of Care  Patient;Family member/caregiver    Family Member Consulted  Daughter       Patient will benefit from skilled therapeutic intervention in order to improve the following deficits and impairments:  Abnormal gait, Decreased balance, Decreased endurance, Decreased mobility, Difficulty walking, Cardiopulmonary status limiting activity, Decreased strength, Decreased coordination, Decreased activity  tolerance  Visit Diagnosis: Unsteadiness on feet  Other abnormalities of gait and mobility  Difficulty in walking, not elsewhere classified     Problem List Patient Active Problem List   Diagnosis Date Noted  . Seizure ([place]) 03/18/2019  . Transient atrial fibrillation ([place]) 03/18/2019  . MCI (mild cognitive impairment) 03/18/2019  . Status epilepticus ([place]) 03/05/2019  . Weight loss 01/28/2019  . Acute gout 04/29/2017  . OA (osteoarthritis) of knee 12/17/2016  . Dry skin  dermatitis 06/13/2016  . Health maintenance examination 01/30/2016  . Medicare annual wellness visit, subsequent 03/10/2014  . Advanced care planning/counseling discussion 03/10/2014  . PAD (peripheral artery disease) ([place]) 11/11/2013  . Dyspnea 09/18/2011  . PMR (polymyalgia rheumatica) ([place]) 08/27/2011  . Controlled type 2 diabetes mellitus with diabetic nephropathy, with long-term current use of insulin ([place]) 08/04/2009  . Essential hypertension 06/13/2009  . CKD stage 3 due to type 2 diabetes mellitus ([place]) 01/24/2009  . Primary angle-closure glaucoma 11/13/2007  . Osteopenia 11/13/2007  . History of colon cancer 11/13/2007  . HLD (hyperlipidemia) 10/23/2006    Trotter,Margaret PT, DPT 04/28/2019, 9:50 AM  [place] MAIN [facility] SERVICES [address] [place], [place], [address] Phone: [phone]   Fax:  [phone]  Name: Taylor Castaneda MRN: [id number] Date of Birth: 1932/06/18

## 2019-05-05 ENCOUNTER — Encounter: Payer: Self-pay | Admitting: Physical Therapy

## 2019-05-05 ENCOUNTER — Other Ambulatory Visit: Payer: Self-pay

## 2019-05-05 ENCOUNTER — Ambulatory Visit: Payer: Medicare Other | Attending: Family Medicine | Admitting: Physical Therapy

## 2019-05-05 DIAGNOSIS — R2681 Unsteadiness on feet: Secondary | ICD-10-CM | POA: Insufficient documentation

## 2019-05-05 DIAGNOSIS — R262 Difficulty in walking, not elsewhere classified: Secondary | ICD-10-CM | POA: Insufficient documentation

## 2019-05-05 DIAGNOSIS — R2689 Other abnormalities of gait and mobility: Secondary | ICD-10-CM | POA: Diagnosis not present

## 2019-05-05 NOTE — Therapy (Signed)
[place] MAIN [facility] SERVICES [address] [place], [place], [address] Phone: [phone]   Fax:  [phone]  Physical Therapy Treatment  Patient Details  Name: Taylor Castaneda MRN: [id number] Date of Birth: 1933/01/02 Referring Provider (PT): [name]   Encounter Date: 05/05/2019  PT End of Session - 05/05/19 0946    Visit Number  8    Number of Visits  17    Date for PT Re-Evaluation  05/26/19    PT Start Time  0935    PT Stop Time  1015    PT Time Calculation (min)  40 min    Equipment Utilized During Treatment  Gait belt    Activity Tolerance  Patient tolerated treatment well    Behavior During Therapy  [facility] for tasks assessed/performed       Past Medical History:  Diagnosis Date  . Anemia   . Diabetes mellitus, type II ([place])   . Glaucoma    Dr.Hecker  . History of colon cancer 1998   Dr. Lennie Hummer  . Hyperlipemia   . Osteopenia 02/2012, 03/2014   DEXA hip -2.2  . Renal insufficiency   . Seizures ([place])   . Stenosing tenosynovitis of finger of left hand 2016   index - s/p steroid injection x2    Past Surgical History:  Procedure Laterality Date  . ABI  11/2013   WNL  . APPENDECTOMY    . CARPAL TUNNEL RELEASE     Bilateral   . CATARACT EXTRACTION Bilateral 2006   Implants  . COLECTOMY  1998   For cancer  . COLONOSCOPY  2008   Dr.Stark, Due 2011  . COLONOSCOPY  06/2013   1 hyperplastic polyp, ileocecal anastomosis [facility])  . dexa  02/2012, 03/2014   T score -2.2 at hip overall stable  . REFRACTIVE SURGERY    . TONSILLECTOMY AND ADENOIDECTOMY    . TOTAL KNEE ARTHROPLASTY Left 12/17/2016   Procedure: LEFT TOTAL KNEE ARTHROPLASTY;  Surgeon: Gaynelle Arabian, MD;  Location: [facility];  Service: Orthopedics;  Laterality: Left;    There were no vitals filed for this visit.  Subjective Assessment - 05/05/19 0944    Subjective  Patient reports doing about the same. She does report increased cramping this morning with  walking. She reports taking tylenol this morning and isn't sure why her legs have been cramping more. She reports having a hard time drinking water which could be contributing to cramping;    Pertinent History  Pt is an 85 y/o female admitted to hospital on 03/05/19 after being found down in the floor by family members. MRI of the brain was negative for any acute findings. Neurology was consulted and suspect new onset seizures. Discharged from hospital on 03/07/19. PMH including but not limited to DM, HTN, glaucoma and colon cancer. Patient was independent with ADLs/IADLs prior to hospital admission with occasional use of cane for ambulation. Patient was able to perform bed mobility and transfers but unsteady with ambulation with 1 HHA x50 feet and gait deviation to right with decreased RLE step length, also demonstrated poor balance AEB standing balance Leahy scale score of 'poor'. Now living with her daughter, prior to admission was living alone.    How long can you sit comfortably?  2-3 hours    How long can you stand comfortably?  5-10 minutes    How long can you walk comfortably?  Able to walk around house about 8 times without SPC.  Diagnostic tests  MRI confirming absence of stroke    Patient Stated Goals  Wans to return to water aerobics and walking normally.    Currently in Pain?  Yes    Pain Score  5     Pain Location  Calf    Pain Orientation  Right;Left    Pain Descriptors / Indicators  Cramping    Pain Type  Acute pain    Pain Onset  Today    Pain Frequency  Intermittent    Aggravating Factors   worse with prolonged standing/walking    Pain Relieving Factors  rest/massage/stretch    Effect of Pain on Daily Activities  decreased standing tolerance;    Multiple Pain Sites  No               TREATMENT: Gait in hallway: x200 feet forward walking with cues to increase speed x200 feet with horizontal head turns calling out cards to challenge cognition/dual task. Required close  supervision and cues to increase gait speed for better gait ability with dual task.  x100 feet forward/backward walking directional change with close supervision for safety; Patient exhibits decreased step length with backward walking;  Required close supervision for safety and cues to increase step length and improve gait speed for better dynamic balance control;  She reports increased cramping in bilateral calf after walking short distances requiring short seated rest break;   Standing calf stretch 30 sec hold x2 reps bilaterally;  Patient instructed in advanced balance exercise  Standing in parallel bars:  Standing on 1/2 foam: (Flat side up) -heel/toe rocks with feet apart heel/toe rocks x15 with rail assist for safety -feet apart:  Alternate UE lift x10 reps bilaterally;  Arms by side head turns side/side x5 reps each direction Required CGA for safety and cues to improve ankle strategies to recover balance with posterior loss of balance with unsupported standing;   Weaving around cones #7 x4 sets with min A for safety; Patient required cues to turn and increase step length for better cone negotiation. She was more unsteady when weaving and turning more requiring min A for balance recovery. She does report mild dizziness with increased turning, requiring short seated rest breaks.   Walking forward picking up cones #7 with CGA for safety. Patient did have difficulty keeping balance with initial picking up cones due to unsteadiness but was able to recover with increased repetition.   Exercise: Sit<>stand with 4# yellow ball chest press x10 reps with cues to increase forward lean for better transfer ability;   Leg press: BLE plate 90# [id number] with min VCs for proper positioning and to slow down LE movement for better motor control; Leg press BLE plate 90# heel raises 2x10 with min VCs for positioning and sequencing for better muscle strengthening;   Response to  treatment: Patient tolerated session well. She was able to tolerate increased standing this session with less seated rest breaks. Patient does continue to have intermittent soreness in BLE which is relieved with standing/seated rest break. Patient requires CGA-Min A for most balance exercise. She had increased difficulty with weaving around cones/turning.                   PT Education - 05/05/19 0946    Education provided  Yes    Education Details  balance/strengthening, HEP    Person(s) Educated  Patient    Methods  Explanation;Verbal cues    Comprehension  Verbalized understanding;Returned demonstration;Verbal cues required;Need further instruction  PT Short Term Goals - 04/28/19 0905      PT SHORT TERM GOAL #1   Title  Patient will be independent in initial home exercise program to improve strength/mobility for better functional independence with ADLs.    Time  4    Period  Weeks    Status  Achieved    Target Date  04/28/19        PT Long Term Goals - 04/28/19 0905      PT LONG TERM GOAL #1   Title  Patient will be independent in finalized home exercise program to improve strength/mobility for better functional independence with ADLs.    Time  8    Period  Weeks    Status  Partially Met    Target Date  05/26/19      PT LONG TERM GOAL #2   Title  Patient will increase 10 meter walk test to >1.90ms as to improve gait speed for better community ambulation and to reduce fall risk.    Baseline  03/31/19: 0.86 m/s;    Time  8    Period  Weeks    Status  Partially Met    Target Date  05/26/19      PT LONG TERM GOAL #3   Title  Patient will increase Berg Balance score by > 6 points to demonstrate decreased fall risk during functional activities.    Baseline  03/31/19: 46/56;    Time  8    Period  Weeks    Status  Partially Met    Target Date  05/26/19      PT LONG TERM GOAL #4   Title  Patient will increase six minute walk test distance to >1000 for  progression to community ambulator and improve gait ability    Time  8    Period  Weeks    Status  Partially Met    Target Date  05/26/19            Plan - 05/05/19 1014    Clinical Impression Statement  Patient motivated and participated well within session. She was instructed in advanced balance tasks with dynamic gait tasks. She had increased difficulty with directional changes and with turning requiring min A for balance recovery. Patient reports increased cramping in BLE calf with prolonged standing requiring intermittent seated rest breaks. Patient requires min A for safety with most balance exercise. She would benefit from additional skilled PT Intervention to improve strength, balance and gait safety; Reinforced HEP;    Personal Factors and Comorbidities  Age;Comorbidity 3+    Comorbidities  HTN, DM, cancer (colon), hx of siezure    Examination-Activity Limitations  Carry;Reach Overhead;Locomotion Level;Lift;Stairs;Stand    Examination-Participation Restrictions  Yard Work;Community Activity    Stability/Clinical Decision Making  Stable/Uncomplicated    Rehab Potential  Excellent    PT Frequency  2x / week    PT Duration  8 weeks    PT Treatment/Interventions  ADLs/Self Care Home Management;Aquatic Therapy;Moist Heat;DME Instruction;Gait training;Stair training;Functional mobility training;Therapeutic activities;Therapeutic exercise;Balance training;Neuromuscular re-education;Patient/family education;Manual techniques    PT Next Visit Plan  Static and dynamic balance activities, challenge SLS and step negotiation    PT Home Exercise Plan  Tandem stance hold, semi-tandem head turns, hip ext red TB    Consulted and Agree with Plan of Care  Patient;Family member/caregiver    Family Member Consulted  Daughter       Patient will benefit from skilled therapeutic intervention in order to improve the  following deficits and impairments:  Abnormal gait, Decreased balance, Decreased  endurance, Decreased mobility, Difficulty walking, Cardiopulmonary status limiting activity, Decreased strength, Decreased coordination, Decreased activity tolerance  Visit Diagnosis: Unsteadiness on feet  Other abnormalities of gait and mobility  Difficulty in walking, not elsewhere classified     Problem List Patient Active Problem List   Diagnosis Date Noted  . Seizure ([place]) 03/18/2019  . Transient atrial fibrillation ([place]) 03/18/2019  . MCI (mild cognitive impairment) 03/18/2019  . Status epilepticus ([place]) 03/05/2019  . Weight loss 01/28/2019  . Acute gout 04/29/2017  . OA (osteoarthritis) of knee 12/17/2016  . Dry skin dermatitis 06/13/2016  . Health maintenance examination 01/30/2016  . Medicare annual wellness visit, subsequent 03/10/2014  . Advanced care planning/counseling discussion 03/10/2014  . PAD (peripheral artery disease) ([place]) 11/11/2013  . Dyspnea 09/18/2011  . PMR (polymyalgia rheumatica) ([place]) 08/27/2011  . Controlled type 2 diabetes mellitus with diabetic nephropathy, with long-term current use of insulin ([place]) 08/04/2009  . Essential hypertension 06/13/2009  . CKD stage 3 due to type 2 diabetes mellitus ([place]) 01/24/2009  . Primary angle-closure glaucoma 11/13/2007  . Osteopenia 11/13/2007  . History of colon cancer 11/13/2007  . HLD (hyperlipidemia) 10/23/2006    Trotter,Margaret PT, DPT 05/05/2019, 10:15 AM  [place] [facility] MAIN [facility] SERVICES [address] [place], [place], [address] Phone: [phone]   Fax:  [phone]  Name: Taylor Castaneda MRN: [id number] Date of Birth: 29-Nov-1932

## 2019-05-07 ENCOUNTER — Other Ambulatory Visit: Payer: Self-pay

## 2019-05-07 ENCOUNTER — Encounter: Payer: Self-pay | Admitting: Physical Therapy

## 2019-05-07 ENCOUNTER — Ambulatory Visit: Payer: Medicare Other | Admitting: Physical Therapy

## 2019-05-07 DIAGNOSIS — R2681 Unsteadiness on feet: Secondary | ICD-10-CM | POA: Diagnosis not present

## 2019-05-07 DIAGNOSIS — R262 Difficulty in walking, not elsewhere classified: Secondary | ICD-10-CM

## 2019-05-07 DIAGNOSIS — R2689 Other abnormalities of gait and mobility: Secondary | ICD-10-CM

## 2019-05-07 NOTE — Therapy (Signed)
[place] MAIN [facility] SERVICES [address] [place], [place], [address] Phone: [phone]   Fax:  [phone]  Physical Therapy Treatment  Patient Details  Name: Taylor Castaneda MRN: [id number] Date of Birth: 06-Jul-1932 Referring Provider (PT): [name]   Encounter Date: 05/07/2019  PT End of Session - 05/07/19 0801    Visit Number  9    Number of Visits  17    Date for PT Re-Evaluation  05/26/19    PT Start Time  0802    PT Stop Time  0845    PT Time Calculation (min)  43 min    Equipment Utilized During Treatment  Gait belt    Activity Tolerance  Patient tolerated treatment well    Behavior During Therapy  [facility] for tasks assessed/performed       Past Medical History:  Diagnosis Date  . Anemia   . Diabetes mellitus, type II ([place])   . Glaucoma    Dr.Hecker  . History of colon cancer 1998   Dr. Lennie Hummer  . Hyperlipemia   . Osteopenia 02/2012, 03/2014   DEXA hip -2.2  . Renal insufficiency   . Seizures ([place])   . Stenosing tenosynovitis of finger of left hand 2016   index - s/p steroid injection x2    Past Surgical History:  Procedure Laterality Date  . ABI  11/2013   WNL  . APPENDECTOMY    . CARPAL TUNNEL RELEASE     Bilateral   . CATARACT EXTRACTION Bilateral 2006   Implants  . COLECTOMY  1998   For cancer  . COLONOSCOPY  2008   Dr.Stark, Due 2011  . COLONOSCOPY  06/2013   1 hyperplastic polyp, ileocecal anastomosis [facility])  . dexa  02/2012, 03/2014   T score -2.2 at hip overall stable  . REFRACTIVE SURGERY    . TONSILLECTOMY AND ADENOIDECTOMY    . TOTAL KNEE ARTHROPLASTY Left 12/17/2016   Procedure: LEFT TOTAL KNEE ARTHROPLASTY;  Surgeon: Gaynelle Arabian, MD;  Location: [facility];  Service: Orthopedics;  Laterality: Left;    There were no vitals filed for this visit.  Subjective Assessment - 05/07/19 0945    Subjective  Patient reports doing well. She reports taking some medicine this morning to help with leg  cramps. She reports adherence with HEP. Denies any new falls.    Pertinent History  Pt is an 85 y/o female admitted to hospital on 03/05/19 after being found down in the floor by family members. MRI of the brain was negative for any acute findings. Neurology was consulted and suspect new onset seizures. Discharged from hospital on 03/07/19. PMH including but not limited to DM, HTN, glaucoma and colon cancer. Patient was independent with ADLs/IADLs prior to hospital admission with occasional use of cane for ambulation. Patient was able to perform bed mobility and transfers but unsteady with ambulation with 1 HHA x50 feet and gait deviation to right with decreased RLE step length, also demonstrated poor balance AEB standing balance Leahy scale score of 'poor'. Now living with her daughter, prior to admission was living alone.    How long can you sit comfortably?  2-3 hours    How long can you stand comfortably?  5-10 minutes    How long can you walk comfortably?  Able to walk around house about 8 times without SPC.    Diagnostic tests  MRI confirming absence of stroke    Patient Stated Goals  Wans to return to  water aerobics and walking normally.    Currently in Pain?  No/denies    Pain Onset  Today    Multiple Pain Sites  No           TREATMENT: Warm up on Nustep BUE/BLE level 2 x4 min (Unbilled); Exercise: Standing with green tband around BLE -hip abduction x10 reps bilaterally; -hip extension x10 bilaterally; -hip flexion x10 bilaterally; -side stepping x10 feet x2 laps each;  Seated LAQ with ankle DF x10 reps bilaterally to facilitate increased ROM and reduce cramping in BLE:   Sit<>stand with 4# yellow ball chest press x10 reps with cues to increase forward lean for better transfer ability;  NMR: Instructed patient in advanced balance exercise:   Resisted walking 12.5# forward/backward x2 laps (2 way) and side stepping to right, eccentric left x2 laps; Patient required min A for  safety and min VCS to increase step length especially with backwards walking for better reciprocal gait pattern;  She reports increased cramping in bilateral calf after walking short distances requiring short seated rest break;   Standing in parallel bars:  Standing on 1/2 foam: (Flat side up) -heel/toe rocks with feet apart heel/toe rocks x15 with rail assist for safety -feet apart:             BUE wand lift x10 reps bilaterally;  Holding wand in BUE vertically with trunk rotation side/side x10 reps bilaterally;  Unsupported standing mini squats x10 reps Required MinA for safety and cues to improve ankle strategies to recover balance with posterior loss of balance with unsupported standing;    Standing on 1/2 bolster (flat side up) -tandem stance with 2-0 rail assist 10 sec hold x2 reps each foot in front; -tandem stance unsupported standing with head turns side/side x5 reps each foot in front; -tandem stance unsupported standing with alternate UE lift x5 reps each foot in front; Required min A for safety; Patient able to exhibit good stance control unsupported. She had increased difficulty with head turns and UE lift requiring min A to avoid loss of balance.   Weaving around cones #6 x2 sets with CGA for safety; Patient required cues to turn and increase step length for better cone negotiation.   Side stepping over cones #6 x1 lap each direction with min A for safety and cues to increase hip flexion/increase step length for better cone negotiation;  Walking forward picking up cones #6 with CGA for safety. Patient did have difficulty keeping balance with initial picking up cones due to unsteadiness but was able to recover with increased repetition.    Response to treatment: Patient tolerated session well.She was able to tolerate increased standing this session with less seated rest breaks. Patient does continue to have intermittent soreness in BLE which is relieved with  standing/seated rest break. Patient requires CGA-Min A for most balance exercise. She had less difficulty with weaving around cones. Patient does have difficulty with compliant surfaces particularly bolster with decreased ankle strategies, often reaching for rail assist for balance recovery.   Advanced HEP providing more challenging tband for resistance.               PT Education - 05/07/19 0801    Education provided  Yes    Education Details  balance/strengthening, HEP    Person(s) Educated  Patient    Methods  Explanation;Verbal cues    Comprehension  Verbalized understanding;Returned demonstration;Verbal cues required;Need further instruction       PT Short Term Goals - 04/28/19 [phone]  PT SHORT TERM GOAL #1   Title  Patient will be independent in initial home exercise program to improve strength/mobility for better functional independence with ADLs.    Time  4    Period  Weeks    Status  Achieved    Target Date  04/28/19        PT Long Term Goals - 04/28/19 0905      PT LONG TERM GOAL #1   Title  Patient will be independent in finalized home exercise program to improve strength/mobility for better functional independence with ADLs.    Time  8    Period  Weeks    Status  Partially Met    Target Date  05/26/19      PT LONG TERM GOAL #2   Title  Patient will increase 10 meter walk test to >1.46ms as to improve gait speed for better community ambulation and to reduce fall risk.    Baseline  03/31/19: 0.86 m/s;    Time  8    Period  Weeks    Status  Partially Met    Target Date  05/26/19      PT LONG TERM GOAL #3   Title  Patient will increase Berg Balance score by > 6 points to demonstrate decreased fall risk during functional activities.    Baseline  03/31/19: 46/56;    Time  8    Period  Weeks    Status  Partially Met    Target Date  05/26/19      PT LONG TERM GOAL #4   Title  Patient will increase six minute walk test distance to >1000 for  progression to community ambulator and improve gait ability    Time  8    Period  Weeks    Status  Partially Met    Target Date  05/26/19            Plan - 05/07/19 0830    Clinical Impression Statement  Patient motivated and participated well within session. She had less calf pain this session requiring fewer rest breaks. Patient instructed in advanced strengthening exercise, increasing resistance with tband exercise for HEP. Patient instructed in advanced balance exercise to challenge stance control. Utilized compliant surfaces to challenge ankle strategies. Patient requires CGA to min A for most balance tasks. She did exhibit less unsteadiness weaving around cones this session compared to last session. She would benefit from additional skilled PT Intervention to improve strength, balance and gait safety;    Personal Factors and Comorbidities  Age;Comorbidity 3+    Comorbidities  HTN, DM, cancer (colon), hx of siezure    Examination-Activity Limitations  Carry;Reach Overhead;Locomotion Level;Lift;Stairs;Stand    Examination-Participation Restrictions  Yard Work;Community Activity    Stability/Clinical Decision Making  Stable/Uncomplicated    Rehab Potential  Excellent    PT Frequency  2x / week    PT Duration  8 weeks    PT Treatment/Interventions  ADLs/Self Care Home Management;Aquatic Therapy;Moist Heat;DME Instruction;Gait training;Stair training;Functional mobility training;Therapeutic activities;Therapeutic exercise;Balance training;Neuromuscular re-education;Patient/family education;Manual techniques    PT Next Visit Plan  Static and dynamic balance activities, challenge SLS and step negotiation    PT Home Exercise Plan  Tandem stance hold, semi-tandem head turns, hip ext red TB    Consulted and Agree with Plan of Care  Patient;Family member/caregiver    Family Member Consulted  Daughter       Patient will benefit from skilled therapeutic intervention in order to improve the  following deficits and impairments:  Abnormal gait, Decreased balance, Decreased endurance, Decreased mobility, Difficulty walking, Cardiopulmonary status limiting activity, Decreased strength, Decreased coordination, Decreased activity tolerance  Visit Diagnosis: Unsteadiness on feet  Other abnormalities of gait and mobility  Difficulty in walking, not elsewhere classified     Problem List Patient Active Problem List   Diagnosis Date Noted  . Seizure ([place]) 03/18/2019  . Transient atrial fibrillation ([place]) 03/18/2019  . MCI (mild cognitive impairment) 03/18/2019  . Status epilepticus ([place]) 03/05/2019  . Weight loss 01/28/2019  . Acute gout 04/29/2017  . OA (osteoarthritis) of knee 12/17/2016  . Dry skin dermatitis 06/13/2016  . Health maintenance examination 01/30/2016  . Medicare annual wellness visit, subsequent 03/10/2014  . Advanced care planning/counseling discussion 03/10/2014  . PAD (peripheral artery disease) ([place]) 11/11/2013  . Dyspnea 09/18/2011  . PMR (polymyalgia rheumatica) ([place]) 08/27/2011  . Controlled type 2 diabetes mellitus with diabetic nephropathy, with long-term current use of insulin ([place]) 08/04/2009  . Essential hypertension 06/13/2009  . CKD stage 3 due to type 2 diabetes mellitus ([place]) 01/24/2009  . Primary angle-closure glaucoma 11/13/2007  . Osteopenia 11/13/2007  . History of colon cancer 11/13/2007  . HLD (hyperlipidemia) 10/23/2006    Ruther Ephraim PT, DPT 05/07/2019, 9:49 AM  [place] MAIN [facility] SERVICES [address] [place], [place], [address] Phone: [phone]   Fax:  [phone]  Name: Taylor Castaneda MRN: [id number] Date of Birth: 05/31/1933

## 2019-05-12 ENCOUNTER — Ambulatory Visit: Payer: Medicare Other

## 2019-05-12 ENCOUNTER — Other Ambulatory Visit: Payer: Self-pay

## 2019-05-12 DIAGNOSIS — R2681 Unsteadiness on feet: Secondary | ICD-10-CM | POA: Diagnosis not present

## 2019-05-12 DIAGNOSIS — R2689 Other abnormalities of gait and mobility: Secondary | ICD-10-CM

## 2019-05-12 DIAGNOSIS — R262 Difficulty in walking, not elsewhere classified: Secondary | ICD-10-CM

## 2019-05-12 NOTE — Therapy (Signed)
[place] MAIN [facility] SERVICES [address] [place], [place], [address] Phone: [phone]   Fax:  [phone]  Physical Therapy Treatment Physical Therapy Progress Note   Dates of reporting period  03/31/19   to   05/12/19  Patient Details  Name: Taylor Castaneda MRN: [id number] Date of Birth: January 11, 1933 Referring Provider (PT): [name]   Encounter Date: 05/12/2019  PT End of Session - 05/12/19 0935    Visit Number  10    Number of Visits  17    Date for PT Re-Evaluation  05/26/19    PT Start Time  0856    PT Stop Time  0931    PT Time Calculation (min)  35 min    Equipment Utilized During Treatment  Gait belt    Activity Tolerance  Patient tolerated treatment well    Behavior During Therapy  [facility] for tasks assessed/performed       Past Medical History:  Diagnosis Date  . Anemia   . Diabetes mellitus, type II ([place])   . Glaucoma    Dr.Hecker  . History of colon cancer 1998   Dr. Lennie Hummer  . Hyperlipemia   . Osteopenia 02/2012, 03/2014   DEXA hip -2.2  . Renal insufficiency   . Seizures ([place])   . Stenosing tenosynovitis of finger of left hand 2016   index - s/p steroid injection x2    Past Surgical History:  Procedure Laterality Date  . ABI  11/2013   WNL  . APPENDECTOMY    . CARPAL TUNNEL RELEASE     Bilateral   . CATARACT EXTRACTION Bilateral 2006   Implants  . COLECTOMY  1998   For cancer  . COLONOSCOPY  2008   Dr.Stark, Due 2011  . COLONOSCOPY  06/2013   1 hyperplastic polyp, ileocecal anastomosis [facility])  . dexa  02/2012, 03/2014   T score -2.2 at hip overall stable  . REFRACTIVE SURGERY    . TONSILLECTOMY AND ADENOIDECTOMY    . TOTAL KNEE ARTHROPLASTY Left 12/17/2016   Procedure: LEFT TOTAL KNEE ARTHROPLASTY;  Surgeon: Gaynelle Arabian, MD;  Location: [facility];  Service: Orthopedics;  Laterality: Left;    There were no vitals filed for this visit.  Subjective Assessment - 05/12/19 0933    Subjective   Patient presents late for PT session today. Is having some cramps in her calves this morning but denies any pain or falls. Reports compliance with HEP.    Pertinent History  Pt is an 85 y/o female admitted to hospital on 03/05/19 after being found down in the floor by family members. MRI of the brain was negative for any acute findings. Neurology was consulted and suspect new onset seizures. Discharged from hospital on 03/07/19. PMH including but not limited to DM, HTN, glaucoma and colon cancer. Patient was independent with ADLs/IADLs prior to hospital admission with occasional use of cane for ambulation. Patient was able to perform bed mobility and transfers but unsteady with ambulation with 1 HHA x50 feet and gait deviation to right with decreased RLE step length, also demonstrated poor balance AEB standing balance Leahy scale score of 'poor'. Now living with her daughter, prior to admission was living alone.    How long can you sit comfortably?  2-3 hours    How long can you stand comfortably?  5-10 minutes    How long can you walk comfortably?  Able to walk around house about 8 times without SPC.  Diagnostic tests  MRI confirming absence of stroke    Patient Stated Goals  Wans to return to water aerobics and walking normally.    Currently in Pain?  No/denies    Pain Onset  Today         [facility] PT Assessment - 05/12/19 0001      Berg Balance Test   Sit to Stand  Able to stand without using hands and stabilize independently    Standing Unsupported  Able to stand safely 2 minutes    Sitting with Back Unsupported but Feet Supported on Floor or Stool  Able to sit safely and securely 2 minutes    Stand to Sit  Sits safely with minimal use of hands    Transfers  Able to transfer safely, minor use of hands    Standing Unsupported with Eyes Closed  Able to stand 10 seconds with supervision    Standing Unsupported with Feet Together  Able to place feet together independently and stand 1 minute safely     From Standing, Reach Forward with Outstretched Arm  Can reach confidently >25 cm (10")    From Standing Position, Pick up Object from Floor  Able to pick up shoe safely and easily    From Standing Position, Turn to Look Behind Over each Shoulder  Looks behind from both sides and weight shifts well    Turn 360 Degrees  Able to turn 360 degrees safely but slowly    Standing Unsupported, Alternately Place Feet on Step/Stool  Able to stand independently and complete 8 steps >20 seconds    Standing Unsupported, One Foot in Front  Able to plae foot ahead of the other independently and hold 30 seconds    Standing on One Leg  Able to lift leg independently and hold 5-10 seconds    Total Score  50           Progress note  HEP compliance: reports compliance  10 MWT: 13 seconds first trial; 11 seconds second trial= 0.9 m/s  BERG: 50/56  6 MWT: 460 ft with 3 rest breaks due to calf cramping, no AD     Standing on 1/2 foam: (Flat side up) -heel/toe rocks with feet apart heel/toe rocks x15 with rail assist for safety  Step over and back orange hurdle; decreasing UE support from BUE support to SUE   airex pad: weighted ball(1000 gr) raise 10x ; twist and pass ball to PT 5x each direction for 10x total.  airex pad: horizontal head turns 60 seconds, vertical head turns 60 seconds   Patient's condition has the potential to improve in response to therapy. Maximum improvement is yet to be obtained. The anticipated improvement is attainable and reasonable in a generally predictable time.  Patient reports she is getting better at keeping her balance but still has difficulty walking fast and without losing her balance.   Patient presents late for PT session limiting session duration. Her goals were performed with patient demonstrating progression with gait and balance. Her velocity of ambulation improved with repeated testing due to distraction with first test. Her ability to perform SLS is improved  with prolonged holds without LOB.  Patient's condition has the potential to improve in response to therapy. Maximum improvement is yet to be obtained. The anticipated improvement is attainable and reasonable in a generally predictable time.She would benefit from additional skilled PT Intervention to improve strength, balance and gait safety;          PT Education -  05/12/19 0934    Education provided  Yes    Education Details  goals, balance, body mechanics    Person(s) Educated  Patient    Methods  Explanation;Demonstration;Tactile cues;Verbal cues    Comprehension  Verbalized understanding;Returned demonstration;Verbal cues required;Tactile cues required       PT Short Term Goals - 05/12/19 0938      PT SHORT TERM GOAL #1   Title  Patient will be independent in initial home exercise program to improve strength/mobility for better functional independence with ADLs.    Time  4    Period  Weeks    Status  Achieved    Target Date  04/28/19        PT Long Term Goals - 05/12/19 0939      PT LONG TERM GOAL #1   Title  Patient will be independent in finalized home exercise program to improve strength/mobility for better functional independence with ADLs.    Baseline  12/8: HEP compliant    Time  8    Period  Weeks    Status  Achieved      PT LONG TERM GOAL #2   Title  Patient will increase 10 meter walk test to >1.70ms as to improve gait speed for better community ambulation and to reduce fall risk.    Baseline  03/31/19: 0.86 m/s; 12/8: 0.9 m/s    Time  8    Period  Weeks    Status  Partially Met    Target Date  05/26/19      PT LONG TERM GOAL #3   Title  Patient will increase Berg Balance score by > 6 points to demonstrate decreased fall risk during functional activities.    Baseline  03/31/19: 46/56; 12/8: 50/56    Time  8    Period  Weeks    Status  Partially Met    Target Date  05/26/19      PT LONG TERM GOAL #4   Title  Patient will increase six minute walk  test distance to >1000 for progression to community ambulator and improve gait ability    Baseline  12/8: 460 ft , 3 rest breaks due to calf cramps    Time  8    Period  Weeks    Status  Partially Met    Target Date  05/26/19            Plan - 05/12/19 08828   Clinical Impression Statement  Patient presents late for PT session limiting session duration. Her goals were performed with patient demonstrating progression with gait and balance. Her velocity of ambulation improved with repeated testing due to distraction with first test. Her ability to perform SLS is improved with prolonged holds without LOB.  Patient's condition has the potential to improve in response to therapy. Maximum improvement is yet to be obtained. The anticipated improvement is attainable and reasonable in a generally predictable time.She would benefit from additional skilled PT Intervention to improve strength, balance and gait safety;    Personal Factors and Comorbidities  Age;Comorbidity 3+    Comorbidities  HTN, DM, cancer (colon), hx of siezure    Examination-Activity Limitations  Carry;Reach Overhead;Locomotion Level;Lift;Stairs;Stand    Examination-Participation Restrictions  Yard Work;Community Activity    Stability/Clinical Decision Making  Stable/Uncomplicated    Rehab Potential  Excellent    PT Frequency  2x / week    PT Duration  8 weeks    PT Treatment/Interventions  ADLs/Self Care Home  Management;Aquatic Therapy;Moist Heat;DME Instruction;Gait training;Stair training;Functional mobility training;Therapeutic activities;Therapeutic exercise;Balance training;Neuromuscular re-education;Patient/family education;Manual techniques    PT Next Visit Plan  Static and dynamic balance activities, challenge SLS and step negotiation    PT Home Exercise Plan  Tandem stance hold, semi-tandem head turns, hip ext red TB    Consulted and Agree with Plan of Care  Patient;Family member/caregiver    Family Member Consulted   Daughter       Patient will benefit from skilled therapeutic intervention in order to improve the following deficits and impairments:  Abnormal gait, Decreased balance, Decreased endurance, Decreased mobility, Difficulty walking, Cardiopulmonary status limiting activity, Decreased strength, Decreased coordination, Decreased activity tolerance  Visit Diagnosis: Unsteadiness on feet  Other abnormalities of gait and mobility  Difficulty in walking, not elsewhere classified     Problem List Patient Active Problem List   Diagnosis Date Noted  . Seizure ([place]) 03/18/2019  . Transient atrial fibrillation ([place]) 03/18/2019  . MCI (mild cognitive impairment) 03/18/2019  . Status epilepticus ([place]) 03/05/2019  . Weight loss 01/28/2019  . Acute gout 04/29/2017  . OA (osteoarthritis) of knee 12/17/2016  . Dry skin dermatitis 06/13/2016  . Health maintenance examination 01/30/2016  . Medicare annual wellness visit, subsequent 03/10/2014  . Advanced care planning/counseling discussion 03/10/2014  . PAD (peripheral artery disease) ([place]) 11/11/2013  . Dyspnea 09/18/2011  . PMR (polymyalgia rheumatica) ([place]) 08/27/2011  . Controlled type 2 diabetes mellitus with diabetic nephropathy, with long-term current use of insulin ([place]) 08/04/2009  . Essential hypertension 06/13/2009  . CKD stage 3 due to type 2 diabetes mellitus ([place]) 01/24/2009  . Primary angle-closure glaucoma 11/13/2007  . Osteopenia 11/13/2007  . History of colon cancer 11/13/2007  . HLD (hyperlipidemia) 10/23/2006   Janna Arch, PT, DPT   05/12/2019, 9:42 AM  [place] MAIN [facility] SERVICES [address] [place], [place], [address] Phone: [phone]   Fax:  [phone]  Name: Taylor Castaneda MRN: [id number] Date of Birth: December 18, 1932

## 2019-05-14 ENCOUNTER — Ambulatory Visit: Payer: Medicare Other | Admitting: Physical Therapy

## 2019-05-14 ENCOUNTER — Telehealth: Payer: Self-pay | Admitting: Family Medicine

## 2019-05-14 ENCOUNTER — Other Ambulatory Visit: Payer: Self-pay

## 2019-05-14 ENCOUNTER — Encounter: Payer: Self-pay | Admitting: Physical Therapy

## 2019-05-14 DIAGNOSIS — R262 Difficulty in walking, not elsewhere classified: Secondary | ICD-10-CM | POA: Diagnosis not present

## 2019-05-14 DIAGNOSIS — R2681 Unsteadiness on feet: Secondary | ICD-10-CM | POA: Diagnosis not present

## 2019-05-14 DIAGNOSIS — R2689 Other abnormalities of gait and mobility: Secondary | ICD-10-CM

## 2019-05-14 NOTE — Telephone Encounter (Signed)
Spoke with pt's daughter, [name] (on dpr), relaying Dr. Synthia Innocent message/instructions.  Verbalizes understanding and will call back Mon, 05/18/19 with an update.

## 2019-05-14 NOTE — Telephone Encounter (Addendum)
Agree these BPs are too low - let's decrease carvedilol to 12.5mg  twice daly (1/2 tab). If still with low BP, decrease ramipril to 5mg  daily (1/2 tab).  Call us Monday with update on BP readings and episodes.  Ensure staying well hydrated as well.  Also check sugars when she has these episodes to ensure not hypoglycemia related.

## 2019-05-14 NOTE — Therapy (Signed)
[place] MAIN [facility] SERVICES [address] [place], [place], [address] Phone: [phone]   Fax:  [phone]  Physical Therapy Treatment  Patient Details  Name: Taylor Castaneda MRN: [id number] Date of Birth: 1933/03/07 Referring Provider (PT): [name]   Encounter Date: 05/14/2019  PT End of Session - 05/14/19 0847    Visit Number  11    Number of Visits  17    Date for PT Re-Evaluation  05/26/19    PT Start Time  0846    PT Stop Time  0930    PT Time Calculation (min)  44 min    Equipment Utilized During Treatment  Gait belt    Activity Tolerance  Patient tolerated treatment well    Behavior During Therapy  [facility] for tasks assessed/performed       Past Medical History:  Diagnosis Date  . Anemia   . Diabetes mellitus, type II ([place])   . Glaucoma    Dr.Hecker  . History of colon cancer 1998   Dr. Lennie Hummer  . Hyperlipemia   . Osteopenia 02/2012, 03/2014   DEXA hip -2.2  . Renal insufficiency   . Seizures ([place])   . Stenosing tenosynovitis of finger of left hand 2016   index - s/p steroid injection x2    Past Surgical History:  Procedure Laterality Date  . ABI  11/2013   WNL  . APPENDECTOMY    . CARPAL TUNNEL RELEASE     Bilateral   . CATARACT EXTRACTION Bilateral 2006   Implants  . COLECTOMY  1998   For cancer  . COLONOSCOPY  2008   Dr.Stark, Due 2011  . COLONOSCOPY  06/2013   1 hyperplastic polyp, ileocecal anastomosis [facility] Plan)  . dexa  02/2012, 03/2014   T score -2.2 at hip overall stable  . REFRACTIVE SURGERY    . TONSILLECTOMY AND ADENOIDECTOMY    . TOTAL KNEE ARTHROPLASTY Left 12/17/2016   Procedure: LEFT TOTAL KNEE ARTHROPLASTY;  Surgeon: Gaynelle Arabian, MD;  Location: [facility];  Service: Orthopedics;  Laterality: Left;    There were no vitals filed for this visit.  Subjective Assessment - 05/14/19 0858    Subjective  Patient reports doing well. Her daughter reports that they are taking Ms Taylor Castaneda home at  times but she is still living with her daughter for supervision. She reports having a spell over the weekend where she zoned out and was unresponsive. Concerned for silent seizures. BP has been monitored and was lower on the weekend but still WNL. Patient reports continued soreness in BLE calf with increased cramping.    Pertinent History  Pt is an 85 y/o female admitted to hospital on 03/05/19 after being found down in the floor by family members. MRI of the brain was negative for any acute findings. Neurology was consulted and suspect new onset seizures. Discharged from hospital on 03/07/19. PMH including but not limited to DM, HTN, glaucoma and colon cancer. Patient was independent with ADLs/IADLs prior to hospital admission with occasional use of cane for ambulation. Patient was able to perform bed mobility and transfers but unsteady with ambulation with 1 HHA x50 feet and gait deviation to right with decreased RLE step length, also demonstrated poor balance AEB standing balance Leahy scale score of 'poor'. Now living with her daughter, prior to admission was living alone.    How long can you sit comfortably?  2-3 hours    How long can you stand comfortably?  5-10  minutes    How long can you walk comfortably?  Able to walk around house about 8 times without SPC.    Diagnostic tests  MRI confirming absence of stroke    Patient Stated Goals  Wans to return to water aerobics and walking normally.    Currently in Pain?  Yes    Pain Score  5     Pain Location  Calf    Pain Orientation  Right;Left    Pain Descriptors / Indicators  Cramping    Pain Type  Chronic pain    Pain Onset  1 to 4 weeks ago    Pain Frequency  Intermittent    Aggravating Factors   worse with prolonged standing/walking    Pain Relieving Factors  rest/massage/stretch    Effect of Pain on Daily Activities  decreased standing tolerance;    Multiple Pain Sites  No            TREATMENT: NMR:  Gait in hallway: x200 feet  forward walking with cues to increase speed x100 feet with horizontal head turns  x100 feet with vertical head turns Required close supervision and cues to increase gait speed for better gait ability with dual task.  x100 feet forward/backward walking directional change with close supervision for safety; Patient exhibits decreased step length with backward walking;  Required close supervision for safety and cues to increase step length and improve gait speed for better dynamic balance control;  Instructed patient in gait in gym with dual tasks including: -picking up cones in various places from various heights on counter, floor, etc x7 cones with close supervision and cues for finding cones -picking up flower pot with poinsettia and carrying x20 feet x2 reps with close supervision. Patient exhibits good safety awareness being able to negotiate obstacles without loss of balance;   She reports increased cramping in bilateral calf after walking short distances requiring short seated rest break;   Exercise: Standing with green tband around BLE -hip abduction x15 reps bilaterally; -hip extension x15 bilaterally; -hip flexion x15 bilaterally; -side stepping x15 feet x2 laps each direction without rail assist to challenge dynamic balance;  Patient required min-moderate verbal/tactile cues for correct exercise technique including cues to avoid trunk lean and to improve foot placement for better hip strengthening;    NMR: Instructed patient in advanced balance exercise:    Standing in parallel bars:  Standing on 1/2 foam: (Flat side up) -heel/toe rocks with feet apart heel/toe rocks x15 with rail assist for safety -feet apart: BUE wand lift x10 reps bilaterally;             Holding wand in BUE vertically with trunk rotation side/side x10 reps bilaterally;             Unsupported standing mini squats x10 reps Required MinA for safety and cues to improve ankle strategies  to recover balance with posterior loss of balance with unsupported standing;    Standing on 1/2 bolster (flat side up) -tandem stance with 2-0 rail assist 10 sec hold x2 reps each foot in front; -tandem stance unsupported standing with head turns side/side, up/down x5 reps each foot in front; -tandem stance unsupported standing with alternate UE lift x5 reps each foot in front; Required min A for safety; Patient able to exhibit good stance control unsupported. She had increased difficulty with head turns and UE lift requiring min A to avoid loss of balance.    Response to treatment: Patient tolerated session well.She continues to  have calf pain requiring intermittent seated rest break.Patient requires CGA-Min Afor most balance exercise. She had less difficulty with weaving around cones. Patient does have difficulty with compliant surfaces particularly bolster with decreased ankle strategies, often reaching for rail assist for balance recovery.   Advanced HEP providing more challenging tband for resistance.                       PT Education - 05/14/19 0846    Education provided  Yes    Education Details  balance, gait safety, HEP    Person(s) Educated  Patient    Methods  Explanation;Verbal cues    Comprehension  Verbalized understanding;Returned demonstration;Verbal cues required;Need further instruction       PT Short Term Goals - 05/12/19 6153      PT SHORT TERM GOAL #1   Title  Patient will be independent in initial home exercise program to improve strength/mobility for better functional independence with ADLs.    Time  4    Period  Weeks    Status  Achieved    Target Date  04/28/19        PT Long Term Goals - 05/12/19 0939      PT LONG TERM GOAL #1   Title  Patient will be independent in finalized home exercise program to improve strength/mobility for better functional independence with ADLs.    Baseline  12/8: HEP compliant    Time  8     Period  Weeks    Status  Achieved      PT LONG TERM GOAL #2   Title  Patient will increase 10 meter walk test to >1.48ms as to improve gait speed for better community ambulation and to reduce fall risk.    Baseline  03/31/19: 0.86 m/s; 12/8: 0.9 m/s    Time  8    Period  Weeks    Status  Partially Met    Target Date  05/26/19      PT LONG TERM GOAL #3   Title  Patient will increase Berg Balance score by > 6 points to demonstrate decreased fall risk during functional activities.    Baseline  03/31/19: 46/56; 12/8: 50/56    Time  8    Period  Weeks    Status  Partially Met    Target Date  05/26/19      PT LONG TERM GOAL #4   Title  Patient will increase six minute walk test distance to >1000 for progression to community ambulator and improve gait ability    Baseline  12/8: 460 ft , 3 rest breaks due to calf cramps    Time  8    Period  Weeks    Status  Partially Met    Target Date  05/26/19            Plan - 05/14/19 07943   Clinical Impression Statement  Patient motivated and participated well within session. Instructed patient in advanced gait tasks to challeng dynamic balance with walking. She requires intermittent seated rest breaks due to increased calf cramping and soreness.Patient was able to carry items well without unsteadiness. Patient instructed in advanced LE strengthening with increased repetition. Patient does fatigue quickly and requires min VCs for correct positioning. Patient instructed in ankle ROM/strategies to improve flexibility and reduce soreness. Patient would benefit from additional skilled PT intervention to improve strength, balance and mobility;    Personal Factors and Comorbidities  Age;Comorbidity 3+  Comorbidities  HTN, DM, cancer (colon), hx of siezure    Examination-Activity Limitations  Carry;Reach Overhead;Locomotion Level;Lift;Stairs;Stand    Examination-Participation Restrictions  Yard Work;Community Activity    Stability/Clinical Decision  Making  Stable/Uncomplicated    Rehab Potential  Excellent    PT Frequency  2x / week    PT Duration  8 weeks    PT Treatment/Interventions  ADLs/Self Care Home Management;Aquatic Therapy;Moist Heat;DME Instruction;Gait training;Stair training;Functional mobility training;Therapeutic activities;Therapeutic exercise;Balance training;Neuromuscular re-education;Patient/family education;Manual techniques    PT Next Visit Plan  Static and dynamic balance activities, challenge SLS and step negotiation    PT Home Exercise Plan  Tandem stance hold, semi-tandem head turns, hip ext red TB    Consulted and Agree with Plan of Care  Patient;Family member/caregiver    Family Member Consulted  Daughter       Patient will benefit from skilled therapeutic intervention in order to improve the following deficits and impairments:  Abnormal gait, Decreased balance, Decreased endurance, Decreased mobility, Difficulty walking, Cardiopulmonary status limiting activity, Decreased strength, Decreased coordination, Decreased activity tolerance  Visit Diagnosis: Unsteadiness on feet  Other abnormalities of gait and mobility  Difficulty in walking, not elsewhere classified     Problem List Patient Active Problem List   Diagnosis Date Noted  . Seizure ([place]) 03/18/2019  . Transient atrial fibrillation ([place]) 03/18/2019  . MCI (mild cognitive impairment) 03/18/2019  . Status epilepticus ([place]) 03/05/2019  . Weight loss 01/28/2019  . Acute gout 04/29/2017  . OA (osteoarthritis) of knee 12/17/2016  . Dry skin dermatitis 06/13/2016  . Health maintenance examination 01/30/2016  . Medicare annual wellness visit, subsequent 03/10/2014  . Advanced care planning/counseling discussion 03/10/2014  . PAD (peripheral artery disease) ([place]) 11/11/2013  . Dyspnea 09/18/2011  . PMR (polymyalgia rheumatica) ([place]) 08/27/2011  . Controlled type 2 diabetes mellitus with diabetic nephropathy, with long-term current use of insulin  ([place]) 08/04/2009  . Essential hypertension 06/13/2009  . CKD stage 3 due to type 2 diabetes mellitus ([place]) 01/24/2009  . Primary angle-closure glaucoma 11/13/2007  . Osteopenia 11/13/2007  . History of colon cancer 11/13/2007  . HLD (hyperlipidemia) 10/23/2006    , PT, DPT 05/14/2019, 9:33 AM  [place] MAIN [facility] SERVICES [address] [place], [place], [address] Phone: [phone]   Fax:  [phone]  Name: DELA SWEENY MRN: [id number] Date of Birth: 06-06-1932

## 2019-05-14 NOTE — Telephone Encounter (Signed)
Received call from daughter [name] Pt has been having seizures, low BP readings back in October, seen in ED. No history of seizures.  Seen by Neurology. Stroke ruled out. Pt has been staying with daughter due to having frequent falls recently - seems to "zone out", not seizure like behavior. Most recent episode patient was standing at the counter taking her medications and she just "zoned out" and started going down, Patients Son was able to lower her to a sitting position on the floor,  BP 100/65 , HR 56.  Patient becomes somewhat incoherent during episodes.   ---- BP today AM was 78/51 left arm, sitting HR 64  Checked about 5 minutes later 96/59 left arm, sitting HR 63  Checked about 1 hr later 111/57 right arm, supine HR 57  Today patient had PT this morning, she says she is doing fine and feels okay, son is supposed to check on her later today. Daughter is concerned with low BP readings today.   Please advise, thanks.

## 2019-05-19 ENCOUNTER — Ambulatory Visit: Payer: Medicare Other

## 2019-05-19 ENCOUNTER — Other Ambulatory Visit: Payer: Self-pay

## 2019-05-19 DIAGNOSIS — R262 Difficulty in walking, not elsewhere classified: Secondary | ICD-10-CM

## 2019-05-19 DIAGNOSIS — R2689 Other abnormalities of gait and mobility: Secondary | ICD-10-CM

## 2019-05-19 DIAGNOSIS — R2681 Unsteadiness on feet: Secondary | ICD-10-CM | POA: Diagnosis not present

## 2019-05-19 MED ORDER — CARVEDILOL 12.5 MG PO TABS: 12.5000 mg | ORAL_TABLET | Freq: Two times a day (BID) | ORAL | 1 refills | Status: DC

## 2019-05-19 NOTE — Therapy (Signed)
[place] MAIN [facility] SERVICES [address] [place], [place], [address] Phone: [phone]   Fax:  [phone]  Physical Therapy Treatment  Patient Details  Name: Taylor Castaneda  MRN: [id number] Date of Birth: 1933-05-31 Referring Provider (PT): [name]   Encounter Date: 05/19/2019  PT End of Session - 05/19/19 0926    Visit Number  12    Number of Visits  17    Date for PT Re-Evaluation  05/26/19    PT Start Time  0846    PT Stop Time  0930    PT Time Calculation (min)  44 min    Equipment Utilized During Treatment  Gait belt    Activity Tolerance  Patient tolerated treatment well    Behavior During Therapy  [facility] for tasks assessed/performed       Past Medical History:  Diagnosis Date  . Anemia   . Diabetes mellitus, type II ([place])   . Glaucoma    Dr.Hecker  . History of colon cancer 1998   Dr. Lennie Hummer  . Hyperlipemia   . Osteopenia 02/2012, 03/2014   DEXA hip -2.2  . Renal insufficiency   . Seizures ([place])   . Stenosing tenosynovitis of finger of left hand 2016   index - s/p steroid injection x2    Past Surgical History:  Procedure Laterality Date  . ABI  11/2013   WNL  . APPENDECTOMY    . CARPAL TUNNEL RELEASE     Bilateral   . CATARACT EXTRACTION Bilateral 2006   Implants  . COLECTOMY  1998   For cancer  . COLONOSCOPY  2008   Dr.Stark, Due 2011  . COLONOSCOPY  06/2013   1 hyperplastic polyp, ileocecal anastomosis [facility] Plan)  . dexa  02/2012, 03/2014   T score -2.2 at hip overall stable  . REFRACTIVE SURGERY    . TONSILLECTOMY AND ADENOIDECTOMY    . TOTAL KNEE ARTHROPLASTY Left 12/17/2016   Procedure: LEFT TOTAL KNEE ARTHROPLASTY;  Surgeon: Gaynelle Arabian, MD;  Location: [facility];  Service: Orthopedics;  Laterality: Left;    There were no vitals filed for this visit.  Subjective Assessment - 05/19/19 0923    Subjective  Patient's daughter reports patient's blood pressure has been low the past few days. Was a  systolic in the 59D this morning. Patient reports not sleeping well last night.    Pertinent History  Pt is an 85 y/o female admitted to hospital on 03/05/19 after being found down in the floor by family members. MRI of the brain was negative for any acute findings. Neurology was consulted and suspect new onset seizures. Discharged from hospital on 03/07/19. PMH including but not limited to DM, HTN, glaucoma and colon cancer. Patient was independent with ADLs/IADLs prior to hospital admission with occasional use of cane for ambulation. Patient was able to perform bed mobility and transfers but unsteady with ambulation with 1 HHA x50 feet and gait deviation to right with decreased RLE step length, also demonstrated poor balance AEB standing balance Leahy scale score of 'poor'. Now living with her daughter, prior to admission was living alone.    How long can you sit comfortably?  2-3 hours    How long can you stand comfortably?  5-10 minutes    How long can you walk comfortably?  Able to walk around house about 8 times without SPC.    Diagnostic tests  MRI confirming absence of stroke    Patient Stated Goals  [name]  to return to water aerobics and walking normally.    Currently in Pain?  No/denies          Patient's daughter reports patient's blood pressure has been low the past few days. Was a systolic in the 32I this morning. Patient reports not sleeping well last night.   Vitals at start of session : 135/53 pulse 59        TREATMENT: NMR:   Gait in hallway: x160 feet with horizontal ball twists x160 feet with vertical ball lifts x160 ft with dual task of naming shopping list  Required close supervision and cues due to frequent LOB with ball movements Required close supervision for safety and cues to increase step length and improve gait speed for better dynamic balance control;       In // bars: Airex pad: one foot on 6" step one foot on airex pad: hold 30 seconds each position, more  challenging with LLE back.  Airex pad: 6" step toe taps no UE support 20x ; challenging to stabilize on LLE Step over orange hurdle SUE support 15x, challenging initially, improved with cue "knee to sky"  airex pad: ball tosses into basketball hoops x 4 minutes reaching inside outside BOS   Exercise technique  Standing with green tband around BLE -hip abduction x15 reps bilaterally; -hip extension x15 bilaterally; -hip flexion x15 bilaterally; -side stepping x15 feet x2 laps each direction without rail assist to challenge dynamic balance;   10x STS with arms crossed from standard height chair.    Patient required min-moderate verbal/tactile cues for correct exercise technique including cues to avoid trunk lean and to improve foot placement for better hip strengthening;    Patient required min-moderate verbal/tactile cues for correct exercise technique including cues to avoid trunk lean and to improve foot placement for better hip strengthening;                     PT Education - 05/19/19 0923    Education provided  Yes    Education Details  exercise technique, body mechanics    Person(s) Educated  Patient    Methods  Explanation;Demonstration;Tactile cues;Verbal cues    Comprehension  Verbalized understanding;Returned demonstration;Verbal cues required;Tactile cues required       PT Short Term Goals - 05/12/19 0938      PT SHORT TERM GOAL #1   Title  Patient will be independent in initial home exercise program to improve strength/mobility for better functional independence with ADLs.    Time  4    Period  Weeks    Status  Achieved    Target Date  04/28/19        PT Long Term Goals - 05/12/19 0939      PT LONG TERM GOAL #1   Title  Patient will be independent in finalized home exercise program to improve strength/mobility for better functional independence with ADLs.    Baseline  12/8: HEP compliant    Time  8    Period  Weeks    Status  Achieved       PT LONG TERM GOAL #2   Title  Patient will increase 10 meter walk test to >1.57ms as to improve gait speed for better community ambulation and to reduce fall risk.    Baseline  03/31/19: 0.86 m/s; 12/8: 0.9 m/s    Time  8    Period  Weeks    Status  Partially Met    Target Date  05/26/19      PT LONG TERM GOAL #3   Title  Patient will increase Berg Balance score by > 6 points to demonstrate decreased fall risk during functional activities.    Baseline  03/31/19: 46/56; 12/8: 50/56    Time  8    Period  Weeks    Status  Partially Met    Target Date  05/26/19      PT LONG TERM GOAL #4   Title  Patient will increase six minute walk test distance to >1000 for progression to community ambulator and improve gait ability    Baseline  12/8: 460 ft , 3 rest breaks due to calf cramps    Time  8    Period  Weeks    Status  Partially Met    Target Date  05/26/19            Plan - 05/19/19 0940    Clinical Impression Statement  Patient presents with excellent motivation. Blood pressure taken due to low levels in previous days however pressure was within functional ranges. Patient is challenged with dual tasking with increased shuffle steps with task addition. Standing on LLE is more challenging than RLE and fatigues quicker. No calf pain or tightness reported throughout session.  Patient would benefit from additional skilled PT intervention to improve strength, balance and mobility;    Personal Factors and Comorbidities  Age;Comorbidity 3+    Comorbidities  HTN, DM, cancer (colon), hx of siezure    Examination-Activity Limitations  Carry;Reach Overhead;Locomotion Level;Lift;Stairs;Stand    Examination-Participation Restrictions  Yard Work;Community Activity    Stability/Clinical Decision Making  Stable/Uncomplicated    Rehab Potential  Excellent    PT Frequency  2x / week    PT Duration  8 weeks    PT Treatment/Interventions  ADLs/Self Care Home Management;Aquatic Therapy;Moist Heat;DME  Instruction;Gait training;Stair training;Functional mobility training;Therapeutic activities;Therapeutic exercise;Balance training;Neuromuscular re-education;Patient/family education;Manual techniques    PT Next Visit Plan  Static and dynamic balance activities, challenge SLS and step negotiation    PT Home Exercise Plan  Tandem stance hold, semi-tandem head turns, hip ext red TB    Consulted and Agree with Plan of Care  Patient;Family member/caregiver    Family Member Consulted  Daughter       Patient will benefit from skilled therapeutic intervention in order to improve the following deficits and impairments:  Abnormal gait, Decreased balance, Decreased endurance, Decreased mobility, Difficulty walking, Cardiopulmonary status limiting activity, Decreased strength, Decreased coordination, Decreased activity tolerance  Visit Diagnosis: Unsteadiness on feet  Other abnormalities of gait and mobility  Difficulty in walking, not elsewhere classified     Problem List Patient Active Problem List   Diagnosis Date Noted  . Seizure ([place]) 03/18/2019  . Transient atrial fibrillation ([place]) 03/18/2019  . MCI (mild cognitive impairment) 03/18/2019  . Status epilepticus ([place]) 03/05/2019  . Weight loss 01/28/2019  . Acute gout 04/29/2017  . OA (osteoarthritis) of knee 12/17/2016  . Dry skin dermatitis 06/13/2016  . Health maintenance examination 01/30/2016  . Medicare annual wellness visit, subsequent 03/10/2014  . Advanced care planning/counseling discussion 03/10/2014  . PAD (peripheral artery disease) ([place]) 11/11/2013  . Dyspnea 09/18/2011  . PMR (polymyalgia rheumatica) ([place]) 08/27/2011  . Controlled type 2 diabetes mellitus with diabetic nephropathy, with long-term current use of insulin ([place]) 08/04/2009  . Essential hypertension 06/13/2009  . CKD stage 3 due to type 2 diabetes mellitus ([place]) 01/24/2009  . Primary angle-closure glaucoma 11/13/2007  . Osteopenia 11/13/2007  .  History of  colon cancer 11/13/2007  . HLD (hyperlipidemia) 10/23/2006   Janna Arch, PT, DPT   05/19/2019, 9:42 AM  [place] MAIN [facility] SERVICES [address] [place], [place], [address] Phone: [phone]   Fax:  [phone]  Name: JOYLENE WESCOTT MRN: [id number] Date of Birth: [birth year]

## 2019-05-19 NOTE — Telephone Encounter (Signed)
[name] calling BP readings as requested.  05/15/2019 10:19 AM BP 104/55 P67                    11:50 AM BP  155/74 P68                     5:45 PM  BP  118/64 P66 05/16/19     8:20 AM   BP   109/54 P 67                    11:00 AM  BP   119/63 P64                     7:40 PM   BP   110/61 P67                     10:00PM  BP   114/62 P72 05/17/19      9:30AM   BP    121/66 P65                     12:30 PM BP   132/66 P65                      6:05 PM BP   132/68 P65 05/18/19       10:58 AM BP  124/61 P60                       12:10 PM BP  120/63 P 67                       4:00PM   BP   137/64 P64 05/19/19         8:15 AM  BP   Rt arm 87/49 P 66                                               Lt arm 97/64 P65  Pt got up at 7:45 and pt drank protein shake and went to therapy.Not sure why BP was low early this morning. At therapy rechecked  At 8:45 AM BP 135/53 P 59  pt is presently taking carvedilol 12.5 mg taking 1/2 tab po bid and pt is taking ramipril 10 mg in the morning. No H/A,dizziness, CP or SOB. Taylor Castaneda request cb after Dr Danise Mina reviews BP readings.

## 2019-05-19 NOTE — Telephone Encounter (Addendum)
Thanks for updated blood pressures - overall doing better. If she's feeling well, continue lower carvedilol dose of 12.5mg  bid. I have sent new dose to pharmacy.  Continue other meds as present as well.

## 2019-05-19 NOTE — Addendum Note (Signed)
Addended by: [name] on: 05/19/2019 05:33 PM   Modules accepted: Orders

## 2019-05-20 NOTE — Telephone Encounter (Signed)
Spoke with pt's daughter, [name], relaying Dr. Synthia Innocent message.  Verbalizes understanding and states pt is doing good on lower carvedilol dose.  Fyi to Dr. Darnell Level.

## 2019-05-21 ENCOUNTER — Encounter: Payer: Self-pay | Admitting: Physical Therapy

## 2019-05-21 ENCOUNTER — Ambulatory Visit: Payer: Medicare Other | Admitting: Physical Therapy

## 2019-05-21 ENCOUNTER — Other Ambulatory Visit: Payer: Self-pay

## 2019-05-21 VITALS — BP 131/60 | HR 68

## 2019-05-21 DIAGNOSIS — R2689 Other abnormalities of gait and mobility: Secondary | ICD-10-CM | POA: Diagnosis not present

## 2019-05-21 DIAGNOSIS — R262 Difficulty in walking, not elsewhere classified: Secondary | ICD-10-CM

## 2019-05-21 DIAGNOSIS — R2681 Unsteadiness on feet: Secondary | ICD-10-CM

## 2019-05-21 NOTE — Therapy (Signed)
[place] MAIN [facility] SERVICES [address] [place], [place], [address] Phone: [phone]   Fax:  [phone]  Physical Therapy Treatment  Patient Details  Name: Taylor Castaneda MRN: [id number] Date of Birth: 03-May-1933 Referring Provider (PT): [name]   Encounter Date: 05/21/2019  PT End of Session - 05/21/19 0851    Visit Number  13    Number of Visits  17    Date for PT Re-Evaluation  05/26/19    PT Start Time  0847    PT Stop Time  0930    PT Time Calculation (min)  43 min    Equipment Utilized During Treatment  Gait belt    Activity Tolerance  Patient tolerated treatment well    Behavior During Therapy  [facility] for tasks assessed/performed       Past Medical History:  Diagnosis Date  . Anemia   . Diabetes mellitus, type II ([place])   . Glaucoma    Dr.Hecker  . History of colon cancer 1998   Dr. Lennie Hummer  . Hyperlipemia   . Osteopenia 02/2012, 03/2014   DEXA hip -2.2  . Renal insufficiency   . Seizures ([place])   . Stenosing tenosynovitis of finger of left hand 2016   index - s/p steroid injection x2    Past Surgical History:  Procedure Laterality Date  . ABI  11/2013   WNL  . APPENDECTOMY    . CARPAL TUNNEL RELEASE     Bilateral   . CATARACT EXTRACTION Bilateral 2006   Implants  . COLECTOMY  1998   For cancer  . COLONOSCOPY  2008   Dr.Stark, Due 2011  . COLONOSCOPY  06/2013   1 hyperplastic polyp, ileocecal anastomosis [facility])  . dexa  02/2012, 03/2014   T score -2.2 at hip overall stable  . REFRACTIVE SURGERY    . TONSILLECTOMY AND ADENOIDECTOMY    . TOTAL KNEE ARTHROPLASTY Left 12/17/2016   Procedure: LEFT TOTAL KNEE ARTHROPLASTY;  Surgeon: Gaynelle Arabian, MD;  Location: [facility];  Service: Orthopedics;  Laterality: Left;    Vitals:   05/21/19 0850  BP: 131/60  Pulse: 68    Subjective Assessment - 05/21/19 0849    Subjective  Patient reports doing fair today. She denies any new spells and reports her  blood pressure has been better. She has been taking some things to help with her leg cramps. She denies any new falls and denies any soreness.    Pertinent History  Pt is an 85 y/o female admitted to hospital on 03/05/19 after being found down in the floor by family members. MRI of the brain was negative for any acute findings. Neurology was consulted and suspect new onset seizures. Discharged from hospital on 03/07/19. PMH including but not limited to DM, HTN, glaucoma and colon cancer. Patient was independent with ADLs/IADLs prior to hospital admission with occasional use of cane for ambulation. Patient was able to perform bed mobility and transfers but unsteady with ambulation with 1 HHA x50 feet and gait deviation to right with decreased RLE step length, also demonstrated poor balance AEB standing balance Leahy scale score of 'poor'. Now living with her daughter, prior to admission was living alone.    How long can you sit comfortably?  2-3 hours    How long can you stand comfortably?  5-10 minutes    How long can you walk comfortably?  Able to walk around house about 8 times without SPC.  Diagnostic tests  MRI confirming absence of stroke    Patient Stated Goals  Wans to return to water aerobics and walking normally.    Currently in Pain?  No/denies              TREATMENT: NMR:  Gait in hallway: x200 feet forward/backward walking x100 feet forward with ball pass side/side x100 feet forward with ball vertical toss and catch x30 feet side step with ball toss x1 lap each direction;  Required close supervision and cues to increase gait speed for better gait ability with dual task. Patient exhibits decreased step length with backward walking; Required close supervision for safety and cues to increase step length   She reports increased cramping in bilateral calf after walking short distances requiring short seated rest break;   Exercise: Standing calf stretch heel off step  stretch 30 sec hold x2 reps each LE;   Leg press: BLE plate 90# 8Z66 with min VCs for proper positioning and to slow down LE movement for better motor control;  Leg press BLE heel raises 90# 2x15 with min VCs for proper positioning including to keep knee straight for better calf strengthening;   Sit<>stand unsupported with arms across chest x10 reps with close supervision for safety;   NMR: Instructed patient in advanced balance exercise:   Standing on 1/2 foam: (Flat side up) -heel/toe rocks with feet apart heel/toe rocks x15 with rail assist for safety -feet apart: Unsupported standing 30 sec hold with CGA for safety;  Unsupported standing head turns side/side x10 reps with CGA for safety  Unsupported standing alternate UE lift x10 reps each with CGA for safety Requiredmin verbal cues to improve ankle strategies to recover balance with posterior loss of balance with unsupported standing;   Standing on 1/2 bolster (flat side up) -tandem stance with 2-0 rail assist 10 sec hold x2 reps each foot in front; -tandem stance unsupported standing with head turns side/side, up/down x5 reps each foot in front; Required min A for safety; Patient able to exhibit good stance control unsupported. She had increased difficulty with head turns requiring min A to avoid loss of balance.  Resisted walking 12.5# forward/backward, side/side x4 way (x2 laps) each direction with min A for safety. Patient did require min Vcs to increase step length for better gait safety. She reports increased cramping with backwards walking requiring short standing rest break.     Response to treatment: Patient tolerated session well.She continues to have calf pain requiring intermittent seated rest break.Patient requires CGA-Min Afor most balance exercise. Patient does have difficulty with compliant surfaces particularly bolster with decreased ankle strategies, often reaching for rail assist for  balance recovery.                      PT Education - 05/21/19 0849    Education provided  Yes    Education Details  balance/exercse technique, HEP    Person(s) Educated  Patient    Methods  Explanation;Verbal cues    Comprehension  Verbalized understanding;Returned demonstration;Verbal cues required;Need further instruction       PT Short Term Goals - 05/12/19 2947      PT SHORT TERM GOAL #1   Title  Patient will be independent in initial home exercise program to improve strength/mobility for better functional independence with ADLs.    Time  4    Period  Weeks    Status  Achieved    Target Date  04/28/19  PT Long Term Goals - 05/12/19 0939      PT LONG TERM GOAL #1   Title  Patient will be independent in finalized home exercise program to improve strength/mobility for better functional independence with ADLs.    Baseline  12/8: HEP compliant    Time  8    Period  Weeks    Status  Achieved      PT LONG TERM GOAL #2   Title  Patient will increase 10 meter walk test to >1.73ms as to improve gait speed for better community ambulation and to reduce fall risk.    Baseline  03/31/19: 0.86 m/s; 12/8: 0.9 m/s    Time  8    Period  Weeks    Status  Partially Met    Target Date  05/26/19      PT LONG TERM GOAL #3   Title  Patient will increase Berg Balance score by > 6 points to demonstrate decreased fall risk during functional activities.    Baseline  03/31/19: 46/56; 12/8: 50/56    Time  8    Period  Weeks    Status  Partially Met    Target Date  05/26/19      PT LONG TERM GOAL #4   Title  Patient will increase six minute walk test distance to >1000 for progression to community ambulator and improve gait ability    Baseline  12/8: 460 ft , 3 rest breaks due to calf cramps    Time  8    Period  Weeks    Status  Partially Met    Target Date  05/26/19            Plan - 05/21/19 07416   Clinical Impression Statement  Patient motivated  and participated well within session. She does continue to require short seated rest breaks due to calf cramping and soreness in legs. She responded well with cues for proper exercise technique exhibiting good motor control and positioning. Patient instructed in advanced balance exercise. She did require CGA to min A with advanced balance tasks especially when standing on compliant surfaces. She would benefit from additional skilled PT intervention to improve strength, balance and gait safety; Reinforced HEP    Personal Factors and Comorbidities  Age;Comorbidity 3+    Comorbidities  HTN, DM, cancer (colon), hx of siezure    Examination-Activity Limitations  Carry;Reach Overhead;Locomotion Level;Lift;Stairs;Stand    Examination-Participation Restrictions  Yard Work;Community Activity    Stability/Clinical Decision Making  Stable/Uncomplicated    Rehab Potential  Excellent    PT Frequency  2x / week    PT Duration  8 weeks    PT Treatment/Interventions  ADLs/Self Care Home Management;Aquatic Therapy;Moist Heat;DME Instruction;Gait training;Stair training;Functional mobility training;Therapeutic activities;Therapeutic exercise;Balance training;Neuromuscular re-education;Patient/family education;Manual techniques    PT Next Visit Plan  Static and dynamic balance activities, challenge SLS and step negotiation    PT Home Exercise Plan  Tandem stance hold, semi-tandem head turns, hip ext red TB    Consulted and Agree with Plan of Care  Patient;Family member/caregiver    Family Member Consulted  Daughter       Patient will benefit from skilled therapeutic intervention in order to improve the following deficits and impairments:  Abnormal gait, Decreased balance, Decreased endurance, Decreased mobility, Difficulty walking, Cardiopulmonary status limiting activity, Decreased strength, Decreased coordination, Decreased activity tolerance  Visit Diagnosis: Unsteadiness on feet  Other abnormalities of gait  and mobility  Difficulty in walking, not elsewhere classified  Problem List Patient Active Problem List   Diagnosis Date Noted  . Seizure ([place]) 03/18/2019  . Transient atrial fibrillation ([place]) 03/18/2019  . MCI (mild cognitive impairment) 03/18/2019  . Status epilepticus ([place]) 03/05/2019  . Weight loss 01/28/2019  . Acute gout 04/29/2017  . OA (osteoarthritis) of knee 12/17/2016  . Dry skin dermatitis 06/13/2016  . Health maintenance examination 01/30/2016  . Medicare annual wellness visit, subsequent 03/10/2014  . Advanced care planning/counseling discussion 03/10/2014  . PAD (peripheral artery disease) ([place]) 11/11/2013  . Dyspnea 09/18/2011  . PMR (polymyalgia rheumatica) ([place]) 08/27/2011  . Controlled type 2 diabetes mellitus with diabetic nephropathy, with long-term current use of insulin ([place]) 08/04/2009  . Essential hypertension 06/13/2009  . CKD stage 3 due to type 2 diabetes mellitus ([place]) 01/24/2009  . Primary angle-closure glaucoma 11/13/2007  . Osteopenia 11/13/2007  . History of colon cancer 11/13/2007  . HLD (hyperlipidemia) 10/23/2006    Eugina Row PT, DPT 05/21/2019, 9:15 AM  [place] MAIN [facility] SERVICES [address] [place], [place], [address] Phone: [phone]   Fax:  [phone]  Name: Taylor Castaneda MRN: [id number] Date of Birth: 1932/12/15

## 2019-05-26 ENCOUNTER — Other Ambulatory Visit: Payer: Self-pay

## 2019-05-26 ENCOUNTER — Ambulatory Visit: Payer: Medicare Other

## 2019-05-26 ENCOUNTER — Encounter: Payer: Self-pay | Admitting: Physical Therapy

## 2019-05-26 DIAGNOSIS — R2681 Unsteadiness on feet: Secondary | ICD-10-CM | POA: Diagnosis not present

## 2019-05-26 DIAGNOSIS — R2689 Other abnormalities of gait and mobility: Secondary | ICD-10-CM

## 2019-05-26 DIAGNOSIS — R262 Difficulty in walking, not elsewhere classified: Secondary | ICD-10-CM | POA: Diagnosis not present

## 2019-05-26 NOTE — Therapy (Addendum)
[place] MAIN [facility] SERVICES [address] [place], [place], [address] Phone: [phone]   Fax:  [phone]  Physical Therapy Treatment Physical Therapy Progress Note   Dates of reporting period 03/31/2019    to   05/26/2019   Patient Details  Name: Taylor Castaneda MRN: [id number] Date of Birth: Feb 05, 1933 Referring Provider (PT): [name]   Encounter Date: 05/26/2019  PT End of Session - 05/26/19 0841    Visit Number  14    Number of Visits  17    Date for PT Re-Evaluation  05/26/19    PT Start Time  0845    PT Stop Time  0928    PT Time Calculation (min)  43 min    Equipment Utilized During Treatment  Gait belt    Activity Tolerance  Patient tolerated treatment well    Behavior During Therapy  [facility] for tasks assessed/performed       Past Medical History:  Diagnosis Date  . Anemia   . Diabetes mellitus, type II ([place])   . Glaucoma    Dr.Hecker  . History of colon cancer 1998   Dr. Lennie Hummer  . Hyperlipemia   . Osteopenia 02/2012, 03/2014   DEXA hip -2.2  . Renal insufficiency   . Seizures ([place])   . Stenosing tenosynovitis of finger of left hand 2016   index - s/p steroid injection x2    Past Surgical History:  Procedure Laterality Date  . ABI  11/2013   WNL  . APPENDECTOMY    . CARPAL TUNNEL RELEASE     Bilateral   . CATARACT EXTRACTION Bilateral 2006   Implants  . COLECTOMY  1998   For cancer  . COLONOSCOPY  2008   Dr.Stark, Due 2011  . COLONOSCOPY  06/2013   1 hyperplastic polyp, ileocecal anastomosis [facility])  . dexa  02/2012, 03/2014   T score -2.2 at hip overall stable  . REFRACTIVE SURGERY    . TONSILLECTOMY AND ADENOIDECTOMY    . TOTAL KNEE ARTHROPLASTY Left 12/17/2016   Procedure: LEFT TOTAL KNEE ARTHROPLASTY;  Surgeon: Gaynelle Arabian, MD;  Location: [facility];  Service: Orthopedics;  Laterality: Left;    There were no vitals filed for this visit.  Subjective Assessment - 05/26/19 0845     Subjective  Patient reported that she is doing well today, no complaints.    Pertinent History  Pt is an 85 y/o female admitted to hospital on 03/05/19 after being found down in the floor by family members. MRI of the brain was negative for any acute findings. Neurology was consulted and suspect new onset seizures. Discharged from hospital on 03/07/19. PMH including but not limited to DM, HTN, glaucoma and colon cancer. Patient was independent with ADLs/IADLs prior to hospital admission with occasional use of cane for ambulation. Patient was able to perform bed mobility and transfers but unsteady with ambulation with 1 HHA x50 feet and gait deviation to right with decreased RLE step length, also demonstrated poor balance AEB standing balance Leahy scale score of 'poor'. Now living with her daughter, prior to admission was living alone.    Limitations  Standing;Walking;House hold activities    How long can you sit comfortably?  2-3 hours    How long can you stand comfortably?  5-10 minutes    How long can you walk comfortably?  Able to walk around house about 8 times without SPC.    Diagnostic tests  MRI confirming absence of  stroke    Patient Stated Goals  Taylor Castaneda to return to water aerobics and walking normally.    Currently in Pain?  No/denies         [facility] PT Assessment - 05/26/19 0001      Berg Balance Test   Sit to Stand  Able to stand without using hands and stabilize independently    Standing Unsupported  Able to stand safely 2 minutes    Sitting with Back Unsupported but Feet Supported on Floor or Stool  Able to sit safely and securely 2 minutes    Stand to Sit  Sits safely with minimal use of hands    Transfers  Able to transfer safely, minor use of hands    Standing Unsupported with Eyes Closed  Able to stand 10 seconds with supervision    Standing Unsupported with Feet Together  Able to place feet together independently and stand 1 minute safely    From Standing, Reach Forward with  Outstretched Arm  Can reach confidently >25 cm (10")    From Standing Position, Pick up Object from Floor  Able to pick up shoe safely and easily    From Standing Position, Turn to Look Behind Over each Shoulder  Looks behind from both sides and weight shifts well    Turn 360 Degrees  Able to turn 360 degrees safely in 4 seconds or less    Standing Unsupported, Alternately Place Feet on Step/Stool  Able to stand independently and complete 8 steps >20 seconds    Standing Unsupported, One Foot in Front  Able to plae foot ahead of the other independently and hold 30 seconds    Standing on One Leg  Able to lift leg independently and hold 5-10 seconds    Total Score  52        TREATMENT: Vitals pre 6 MWT: HR: 66, spO2:100% BP: 148/77  Vitals post 6 MWT: 149/55, HR 74 spO2100%  6 MWT: 575f with no rest breaks  10MWT:  Self selected: .59 m/s Fastest: 1.11 m/s BERG: 52   Sit<>stand unsupported with arms across chest x10 reps with close supervision for safety;    NMR: Instructed patient in advanced balance exercise:    Standing on 1/2 foam: (Flat side up) -heel/toe rocks with feet apart heel/toe rocks x15 with rail assist for safety -feet apart:             Unsupported standing 30 sec hold with CGA for safety;             Unsupported standing head turns side/side x30sec with minA for anterior lean 80% of the time             Unsupported standing alternate UE lift x30sec with minA 80% of the time for anterior leanRequired min verbal cues to improve ankle strategies to recover balance with anterior loss of balance with unsupported standing;     Response to treatment: Overall the patient demonstrated progression towards goals. The patient was able to increase 6MWT distance as well as decreased rest breaks needed. The patient continued to demonstrate improvement in fastest gait velocity but at a self selected speed still demonstrates significantly decreased gait velocity. The patient  exhibited deficits in higher level balance and postural strategies. The patient would benefit from further skilled PT intervention to continue to progress towards goals to maximize safety, mobility, and decrease risk of falls.    PT Education - 05/26/19 [phone]   Education provided  Yes  Education Details  HEP, balance/exercise technique, POC    Person(s) Educated  Patient    Methods  Explanation;Verbal cues    Comprehension  Verbalized understanding;Returned demonstration;Verbal cues required;Need further instruction       PT Short Term Goals - 05/12/19 0865      PT SHORT TERM GOAL #1   Title  Patient will be independent in initial home exercise program to improve strength/mobility for better functional independence with ADLs.    Time  4    Period  Weeks    Status  Achieved    Target Date  04/28/19        PT Long Term Goals - 05/26/19 1156      PT LONG TERM GOAL #1   Title  Patient will be independent in finalized home exercise program to improve strength/mobility for better functional independence with ADLs.    Baseline  12/8: HEP compliant    Time  8    Period  Weeks    Status  Achieved      PT LONG TERM GOAL #2   Title  Patient will increase 10 meter walk test to >1.9ms as to improve gait speed for better community ambulation and to reduce fall risk.    Baseline  03/31/19: 0.86 m/s; 12/8: 0.9 m/s; 12/22: 1.11 m/s, self selected .56 m/s    Time  8    Period  Weeks    Status  Partially Met    Target Date  06/09/19      PT LONG TERM GOAL #3   Title  Patient will increase Berg Balance score by > 6 points to demonstrate decreased fall risk during functional activities.    Baseline  03/31/19: 46/56; 12/8: 50/56; 12/22 52/56    Time  8    Period  Weeks    Status  Achieved      PT LONG TERM GOAL #4   Title  Patient will increase six minute walk test distance to >1000 for progression to community ambulator and improve gait ability    Baseline  12/8: 460 ft , 3 rest  breaks due to calf cramps 12/22; no rest breaks this session, 5937f   Time  8    Period  Weeks    Status  Partially Met    Target Date  06/09/19            Plan - 05/26/19 0912    Clinical Impression Statement  Overall the patient demonstrated progression towards goals. The patient was able to increase 6MWT distance as well as decreased rest breaks needed. The patient continued to demonstrate improvement in fastest gait velocity but at a self selected speed still demonstrates significantly decreased gait velocity. The patient exhibited deficits in higher level balance and postural strategies. The patient would benefit from further skilled PT intervention to continue to progress towards goals to maximize safety, mobility, and decrease risk of falls.    Personal Factors and Comorbidities  Age;Comorbidity 3+    Comorbidities  HTN, DM, cancer (colon), hx of siezure    Examination-Activity Limitations  Carry;Reach Overhead;Locomotion Level;Lift;Stairs;Stand    Examination-Participation Restrictions  [employer]    Rehab Potential  Excellent    PT Frequency  2x / week    PT Duration  2 weeks    PT Treatment/Interventions  ADLs/Self Care Home Management;Aquatic Therapy;Moist Heat;DME Instruction;Gait training;Stair training;Functional mobility training;Therapeutic activities;Therapeutic exercise;Balance training;Neuromuscular re-education;Patient/family education;Manual techniques    PT Next Visit Plan  Static and dynamic balance  activities, challenge SLS and step negotiation    PT Home Exercise Plan  Tandem stance hold, semi-tandem head turns, hip ext and abduction greenTB    Consulted and Agree with Plan of Care  Patient    Family Member Consulted  Daughter       Patient will benefit from skilled therapeutic intervention in order to improve the following deficits and impairments:  Abnormal gait, Decreased balance, Decreased endurance, Decreased mobility, Difficulty walking,  Cardiopulmonary status limiting activity, Decreased strength, Decreased coordination, Decreased activity tolerance  Visit Diagnosis: Unsteadiness on feet - Plan: PT plan of care cert/re-cert  Difficulty in walking, not elsewhere classified - Plan: PT plan of care cert/re-cert  Other abnormalities of gait and mobility - Plan: PT plan of care cert/re-cert     Problem List Patient Active Problem List   Diagnosis Date Noted  . Seizure ([place]) 03/18/2019  . Transient atrial fibrillation ([place]) 03/18/2019  . MCI (mild cognitive impairment) 03/18/2019  . Status epilepticus ([place]) 03/05/2019  . Weight loss 01/28/2019  . Acute gout 04/29/2017  . OA (osteoarthritis) of knee 12/17/2016  . Dry skin dermatitis 06/13/2016  . Health maintenance examination 01/30/2016  . Medicare annual wellness visit, subsequent 03/10/2014  . Advanced care planning/counseling discussion 03/10/2014  . PAD (peripheral artery disease) ([place]) 11/11/2013  . Dyspnea 09/18/2011  . PMR (polymyalgia rheumatica) ([place]) 08/27/2011  . Controlled type 2 diabetes mellitus with diabetic nephropathy, with long-term current use of insulin ([place]) 08/04/2009  . Essential hypertension 06/13/2009  . CKD stage 3 due to type 2 diabetes mellitus ([place]) 01/24/2009  . Primary angle-closure glaucoma 11/13/2007  . Osteopenia 11/13/2007  . History of colon cancer 11/13/2007  . HLD (hyperlipidemia) 10/23/2006    Lieutenant Diego PT, DPT 12:00 PM,05/26/19   [place] MAIN [facility] SERVICES [address] [place], [place], [address] Phone: [phone]   Fax:  [phone]  Name: Taylor Castaneda MRN: [id number] Date of Birth: 19-Oct-1932

## 2019-06-02 ENCOUNTER — Encounter: Payer: Self-pay | Admitting: Internal Medicine

## 2019-06-02 ENCOUNTER — Ambulatory Visit: Payer: Medicare Other | Admitting: Physical Therapy

## 2019-06-02 ENCOUNTER — Telehealth: Payer: Self-pay | Admitting: Family Medicine

## 2019-06-02 ENCOUNTER — Other Ambulatory Visit: Payer: Self-pay

## 2019-06-02 ENCOUNTER — Encounter: Payer: Self-pay | Admitting: Physical Therapy

## 2019-06-02 ENCOUNTER — Ambulatory Visit (INDEPENDENT_AMBULATORY_CARE_PROVIDER_SITE_OTHER): Payer: Medicare Other | Admitting: Internal Medicine

## 2019-06-02 DIAGNOSIS — R2681 Unsteadiness on feet: Secondary | ICD-10-CM | POA: Diagnosis not present

## 2019-06-02 DIAGNOSIS — E1121 Type 2 diabetes mellitus with diabetic nephropathy: Secondary | ICD-10-CM | POA: Diagnosis not present

## 2019-06-02 DIAGNOSIS — Z794 Long term (current) use of insulin: Secondary | ICD-10-CM | POA: Diagnosis not present

## 2019-06-02 DIAGNOSIS — E441 Mild protein-calorie malnutrition: Secondary | ICD-10-CM

## 2019-06-02 DIAGNOSIS — R2689 Other abnormalities of gait and mobility: Secondary | ICD-10-CM

## 2019-06-02 DIAGNOSIS — I9589 Other hypotension: Secondary | ICD-10-CM | POA: Diagnosis not present

## 2019-06-02 DIAGNOSIS — R262 Difficulty in walking, not elsewhere classified: Secondary | ICD-10-CM

## 2019-06-02 DIAGNOSIS — I959 Hypotension, unspecified: Secondary | ICD-10-CM | POA: Insufficient documentation

## 2019-06-02 NOTE — Therapy (Signed)
[facility] [address] [place], [place], [address] Phone: [phone]   Fax:  [phone]  Physical Therapy Treatment  Patient Details  Name: Taylor Castaneda MRN: [id number] Date of Birth: 01/17/1933 Referring Provider (PT): Javier Gutierrez   Encounter Date: 06/02/2019  PT End of Session - 06/02/19 0850    Visit Number  15    Number of Visits  17    Date for PT Re-Evaluation  06/09/19    PT Start Time  0848    PT Stop Time  0930    PT Time Calculation (min)  42 min    Equipment Utilized During Treatment  Gait belt    Activity Tolerance  Patient tolerated treatment well    Behavior During Therapy  WFL for tasks assessed/performed       Past Medical History:  Diagnosis Date  . Anemia   . Diabetes mellitus, type II (HCC)   . Glaucoma    Dr.Hecker  . History of colon cancer 1998   Dr. Tim Davis  . Hyperlipemia   . Osteopenia 02/2012, 03/2014   DEXA hip -2.2  . Renal insufficiency   . Seizures (HCC)   . Stenosing tenosynovitis of finger of left hand 2016   index - s/p steroid injection x2    Past Surgical History:  Procedure Laterality Date  . ABI  11/2013   WNL  . APPENDECTOMY    . CARPAL TUNNEL RELEASE     Bilateral   . CATARACT EXTRACTION Bilateral 2006   Implants  . COLECTOMY  1998   For cancer  . COLONOSCOPY  2008   Dr.Stark, Due 2011  . COLONOSCOPY  06/2013   1 hyperplastic polyp, ileocecal anastomosis (Stark)  . dexa  02/2012, 03/2014   T score -2.2 at hip overall stable  . REFRACTIVE SURGERY    . TONSILLECTOMY AND ADENOIDECTOMY    . TOTAL KNEE ARTHROPLASTY Left 12/17/2016   Procedure: LEFT TOTAL KNEE ARTHROPLASTY;  Surgeon: Aluisio, Frank, MD;  Location: [facility];  Service: Orthopedics;  Laterality: Left;    There were no vitals filed for this visit.  Subjective Assessment - 06/02/19 0853    Subjective  Patient reports doing well. She reports less calf pain overall. She is still staying with  her daughter Taylor Castaneda but will go home and be by herself during the day. She feels close to her PLOF, reporting no dizziness upon standing and being able to pick up things.    Pertinent History  Pt is an 85 y/o female admitted to hospital on 03/05/19 after being found down in the floor by family members. MRI of the brain was negative for any acute findings. Neurology was consulted and suspect new onset seizures. Discharged from hospital on 03/07/19. PMH including but not limited to DM, HTN, glaucoma and colon cancer. Patient was independent with ADLs/IADLs prior to hospital admission with occasional use of cane for ambulation. Patient was able to perform bed mobility and transfers but unsteady with ambulation with 1 HHA x50 feet and gait deviation to right with decreased RLE step length, also demonstrated poor balance AEB standing balance Leahy scale score of 'poor'. Now living with her daughter, prior to admission was living alone.    Limitations  Standing;Walking;House hold activities    How long can you sit comfortably?  2-3 hours    How long can you stand comfortably?  5-10 minutes    How long can you walk comfortably?  Able to   walk around house about 8 times without SPC.    Diagnostic tests  MRI confirming absence of stroke    Patient Stated Goals  Wans to return to water aerobics and walking normally.    Currently in Pain?  No/denies             TREATMENT: Warm up on Nustep BUE/BLE level 2 x4 min (unbilled);   Exercise: Leg press: BLE plate 105# 6L84 with min VCs for proper positioning and to slow down LE movement for better motor control;  Leg press BLE heel raises 105# 2x10 with min VCs for proper positioning including to keep knee straight for better calf strengthening;   NMR: Instructed patient in advanced balance exercise:  Weaving around cones: #6 x2 sets with cones close together to challenge dynamic balance with tight turns, required supervision for safety; Side stepping over  cones #6 x2 sets each direction with min A for safety with multiple episodes of knocking over cones and decreased step length requiring mod VCs to increase hip flexion and increase step length for better dynamic balance;  Standing on 1/2 foam: (Flat side up) -heel/toe rocks with feet apart heel/toe rocks x15 with rail assist for safety -feet apart: Unsupported standing 30 sec hold with CGA for safety;             Unsupported standing head turns side/side x10 reps with CGA for safety             Unsupported standing alternate UE lift x10 reps each with CGA for safety Requiredmin verbal cues to improve ankle strategies to recover balance with posterior loss of balance with unsupported standing;   Standing on 1/2 bolster (flat side up) -tandem stance with BUE wand flexion x10 reps each foot in front; Required CGA for safety; Patient able to exhibit good stance control unsupported.   Resisted walking 12.5# forward/backward, side/side x4 way (x2 laps) each direction with min A for safety. Patient did require min Vcs to increase step length for better gait safety. She reports increased cramping with backwards walking requiring short standing rest break. Patient fatigues quickly in BLE.     Response to treatment: Patient tolerated session fair. Upon end of session, patient had episode of blanking out. She was responsive but was slow to respond and was staggering and had slightly slurred speech. BP was assessed, initially was 86/57, after 2 min reassessed 86/58, after 4 min increased to 107/67, although re-assessed after 5-6 min and dropped back down to 90/56; Patient's glucose was assessed at 131. She denies any dizziness and denies any shortness of breath/chest pain. PT reached out to MD for further management. Her MD made her an appointment for same day for assessment. Patient was sent home with her daughter.                        PT Education - 06/02/19  0854    Education provided  Yes    Education Details  HEP reinforced, balance/exercise    Person(s) Educated  Patient    Methods  Explanation;Verbal cues    Comprehension  Verbalized understanding;Returned demonstration;Verbal cues required;Need further instruction       PT Short Term Goals - 05/12/19 5364      PT SHORT TERM GOAL #1   Title  Patient will be independent in initial home exercise program to improve strength/mobility for better functional independence with ADLs.    Time  4    Period  Weeks  Status  Achieved    Target Date  04/28/19        PT Long Term Goals - 05/26/19 1156      PT LONG TERM GOAL #1   Title  Patient will be independent in finalized home exercise program to improve strength/mobility for better functional independence with ADLs.    Baseline  12/8: HEP compliant    Time  8    Period  Weeks    Status  Achieved      PT LONG TERM GOAL #2   Title  Patient will increase 10 meter walk test to >1.0m/s as to improve gait speed for better community ambulation and to reduce fall risk.    Baseline  03/31/19: 0.86 m/s; 12/8: 0.9 m/s; 12/22: 1.11 m/s, self selected .56 m/s    Time  8    Period  Weeks    Status  Partially Met    Target Date  06/09/19      PT LONG TERM GOAL #3   Title  Patient will increase Berg Balance score by > 6 points to demonstrate decreased fall risk during functional activities.    Baseline  03/31/19: 46/56; 12/8: 50/56; 12/22 52/56    Time  8    Period  Weeks    Status  Achieved      PT LONG TERM GOAL #4   Title  Patient will increase six minute walk test distance to >1000 for progression to community ambulator and improve gait ability    Baseline  12/8: 460 ft , 3 rest breaks due to calf cramps 12/22; no rest breaks this session, 595ft    Time  8    Period  Weeks    Status  Partially Met    Target Date  06/09/19            Plan - 06/02/19 0907    Clinical Impression Statement  Patient motivated and participated well  within session. She continues to fatigue quickly with prolonged standing requiring short seated rest breaks. Patient reports intermittent calf discomfort/cramping with prolonged standing. She also reports increased faitgue. Patient admits she hasn't been able to do as much walking at home because of cold weather outside. She is still adherent to standing HEP working at least 15-20 min each day. Patient instructed in advanced balance exercise to challenge ankle strategies and weight shift. She requires min A with less rail assist and min VCs for proper positioning. At end of session patient had a blank episode where she was less resonsive and glazed over. PT guided patient over to a chair and assessed vitals. BP was low, blood sugars were WFL. Patient's PCP was contacted for further management.  Patient would benefit from additional skilled PT intervention to improve strength, balance and gait safety;    Personal Factors and Comorbidities  Age;Comorbidity 3+    Comorbidities  HTN, DM, cancer (colon), hx of siezure    Examination-Activity Limitations  Carry;Reach Overhead;Locomotion Level;Lift;Stairs;Stand    Examination-Participation Restrictions  Yard Work;Community Activity    Rehab Potential  Excellent    PT Frequency  2x / week    PT Duration  2 weeks    PT Treatment/Interventions  ADLs/Self Care Home Management;Aquatic Therapy;Moist Heat;DME Instruction;Gait training;Stair training;Functional mobility training;Therapeutic activities;Therapeutic exercise;Balance training;Neuromuscular re-education;Patient/family education;Manual techniques    PT Next Visit Plan  Static and dynamic balance activities, challenge SLS and step negotiation    PT Home Exercise Plan  Tandem stance hold, semi-tandem head turns, hip ext and   abduction greenTB    Consulted and Agree with Plan of Care  Patient    Family Member Consulted  Daughter       Patient will benefit from skilled therapeutic intervention in order to  improve the following deficits and impairments:  Abnormal gait, Decreased balance, Decreased endurance, Decreased mobility, Difficulty walking, Cardiopulmonary status limiting activity, Decreased strength, Decreased coordination, Decreased activity tolerance  Visit Diagnosis: Unsteadiness on feet  Difficulty in walking, not elsewhere classified  Other abnormalities of gait and mobility     Problem List Patient Active Problem List   Diagnosis Date Noted  . Hypotension 06/02/2019  . Malnutrition of mild degree (HCC) 06/02/2019  . Seizure (HCC) 03/18/2019  . Transient atrial fibrillation (HCC) 03/18/2019  . MCI (mild cognitive impairment) 03/18/2019  . Status epilepticus (HCC) 03/05/2019  . Weight loss 01/28/2019  . Acute gout 04/29/2017  . OA (osteoarthritis) of knee 12/17/2016  . Dry skin dermatitis 06/13/2016  . Health maintenance examination 01/30/2016  . Medicare annual wellness visit, subsequent 03/10/2014  . Advanced care planning/counseling discussion 03/10/2014  . PAD (peripheral artery disease) (HCC) 11/11/2013  . Dyspnea 09/18/2011  . PMR (polymyalgia rheumatica) (HCC) 08/27/2011  . Controlled type 2 diabetes mellitus with diabetic nephropathy, with long-term current use of insulin (HCC) 08/04/2009  . Essential hypertension 06/13/2009  . CKD stage 3 due to type 2 diabetes mellitus (HCC) 01/24/2009  . Primary angle-closure glaucoma 11/13/2007  . Osteopenia 11/13/2007  . History of colon cancer 11/13/2007  . HLD (hyperlipidemia) 10/23/2006    ,  PT, DPT 06/02/2019, 1:02 PM  [facility] [address] [place], [place], [address] Phone: [phone]   Fax:  [phone]  Name: Shellsea B Bunyard MRN: [id number] Date of Birth: 06/22/1932   

## 2019-06-02 NOTE — Assessment & Plan Note (Signed)
Probably adding to the low BP Lost sense of taste about 6 months ago Discussed strategies for better nutrition

## 2019-06-02 NOTE — Patient Instructions (Signed)
Please have a whole glucerna (or Boost/Ensure) for breakfast. You can add some ice cream if it tastes better that way. Have peanut butter crackers (or equivalent) for lunch. Eat a healthy dinner Monitor your weight several times a week.

## 2019-06-02 NOTE — Telephone Encounter (Signed)
Noted Dr Darnell Level recently reduced the carvedilol and planned to reduce the ramipril if the BP stayed low

## 2019-06-02 NOTE — Assessment & Plan Note (Signed)
BP Readings from Last 3 Encounters:  06/02/19 104/60  05/21/19 131/60  04/21/19 134/62   Consistently low Will cut the ramipril dose as Dr Darnell Level had planned Not bradycardic now

## 2019-06-02 NOTE — Telephone Encounter (Signed)
Spoke with Dr Danise Mina - he was okay with the patient seeing another Provider this morning if not wanting to wait until this afternoon.  The patient daughter preferred her seeing someone this morning so that they dont go home and her BP bottom out again there.   Scheduled with Dr Silvio Pate at 11:15 .   Will send to Dr Silvio Pate and Larene Beach as Juluis Rainier

## 2019-06-02 NOTE — Telephone Encounter (Signed)
Herma Carson from [facility] PT called - requesting recommendations or OV for patient.  During her PT session this morning the patient "blanked out" / had a "glazed over look" suddenly.  Beth checked her BS which was normal.  BP was low when checked - 85/57, 1 minute later after rest 107/67, few minute later BP dropped back down 90/56 No dizziness, nausea, CP or SOB.  Pt is now coherent -- during episode she was "out of it"  Current dose of Coreg still at 1/2 tab in AM and PM Pt has only had two episodes like this since last OV.   Beth states that she may need to be seen to be evaluated -- They are holding her at PT at this time until be call back with an appt time today. Dr Darnell Level does not have any openings this morning.   Do you want to see her this afternoon or have her come on over and see someone this morning?  Please advise, thanks.

## 2019-06-02 NOTE — Assessment & Plan Note (Signed)
Lab Results  Component Value Date   HGBA1C 7 04/06/2019   Discussed avoiding hypoglycemia

## 2019-06-02 NOTE — Progress Notes (Signed)
Subjective:    Patient ID: Taylor Castaneda, female    DOB: Aug 27, 1932, 85 y.o.   MRN: [id number]  HPI Here due to persistent low blood pressure Here with daughter [name]  This visit occurred during the SARS-CoV-2 public health emergency.  Safety protocols were in place, including screening questions prior to the visit, additional usage of staff PPE, and extensive cleaning of exam room while observing appropriate contact time as indicated for disinfecting solutions.   Doing PT and blood pressure is low Had seizure in October---still working on her balance Seemed to "zone out" with therapist today--and BP low (see phone note) She thinks she never lost consciousness ("but not all there")  No chest pain Breathing is fine Not dizzy now  Did have increases in BP meds when it went up in July No clear explanation for that  Sugars are okay Checking 4 times per day No hypoglycemic reactions of note (was 57 once but didn't feel bad)  Currently living with daughter  Current Outpatient Medications on File Prior to Visit  Medication Sig Dispense Refill  . aspirin 81 MG EC tablet Take 1 tablet (81 mg total) by mouth daily. Swallow whole. 30 tablet 0  . atorvastatin (LIPITOR) 40 MG tablet Take 1 tablet (40 mg total) by mouth daily at 6 PM. 1 tablet 0  . B-D ULTRAFINE III SHORT PEN 31G X 8 MM MISC USE AS DIRECTED WITH INSULIN 100 each 3  . Biotin 10000 MCG TABS Take 1 tablet by mouth daily.    . Carboxymethylcellul-Glycerin (CLEAR EYES FOR DRY EYES) 1-0.25 % SOLN Place 1-2 drops into both eyes 3 (three) times daily as needed (for dry/irritated eyes.).    Marland Kitchen carvedilol (COREG) 12.5 MG tablet Take 1 tablet (12.5 mg total) by mouth 2 (two) times daily. 180 tablet 1  . Cholecalciferol (VITAMIN D3) 25 MCG (1000 UT) CAPS Take 1 capsule (1,000 Units total) by mouth daily. 30 capsule   . Cinnamon 500 MG capsule Take 500 mg by mouth daily.    . colchicine 0.6 MG tablet Take 1 tablet (0.6 mg total) by mouth  daily as needed (gout flare). First day of gout flare, may take 1 tablet twice daily. 30 tablet 1  . Ferrous Sulfate (IRON) 325 (65 Fe) MG TABS Take 1 tablet by mouth 2 (two) times a week. Takes Mon and [employer]    . furosemide (LASIX) 40 MG tablet Take 0.5 tablets (20 mg total) by mouth daily. 45 tablet 3  . glucose blood (FREESTYLE LITE) test strip Use as instructed (Patient taking differently: 1 each by Other route 4 (four) times daily. Use as instructed) 100 each 2  . HUMALOG KWIKPEN 100 UNIT/ML KiwkPen Inject 1-10 Units into the skin 3 (three) times daily before meals. Sliding scale  4  . LANTUS SOLOSTAR 100 UNIT/ML Solostar Pen Inject 30 Units into the skin at bedtime.    . levETIRAcetam (KEPPRA) 250 MG tablet Take 1 tablet (250 mg total) by mouth 2 (two) times daily. 180 tablet 4  . Multiple Vitamins-Minerals (PRESERVISION AREDS 2 PO) Take by mouth 2 (two) times daily.    . potassium chloride (K-DUR) 10 MEQ tablet TAKE 1  BY MOUTH ON MONDAY, [facility] AND FRIDAY (Patient taking differently: Take 10 mEq by mouth every Monday, Wednesday, and Friday. ) 36 tablet 3  . ramipril (ALTACE) 10 MG capsule Take 1 capsule (10 mg total) by mouth daily. 90 capsule 3   No current facility-administered medications on file prior  to visit.    Allergies  Allergen Reactions  . Pravastatin Sodium     REACTION: anemia as per Dr Gilford Rile, [facility]    Past Medical History:  Diagnosis Date  . Anemia   . Diabetes mellitus, type II ([place])   . Glaucoma    Dr.Hecker  . History of colon cancer 1998   Dr. Lennie Hummer  . Hyperlipemia   . Osteopenia 02/2012, 03/2014   DEXA hip -2.2  . Renal insufficiency   . Seizures ([place])   . Stenosing tenosynovitis of finger of left hand 2016   index - s/p steroid injection x2    Past Surgical History:  Procedure Laterality Date  . ABI  11/2013   WNL  . APPENDECTOMY    . CARPAL TUNNEL RELEASE     Bilateral   . CATARACT EXTRACTION Bilateral 2006   Implants  . COLECTOMY   1998   For cancer  . COLONOSCOPY  2008   Dr.Stark, Due 2011  . COLONOSCOPY  06/2013   1 hyperplastic polyp, ileocecal anastomosis [facility])  . dexa  02/2012, 03/2014   T score -2.2 at hip overall stable  . REFRACTIVE SURGERY    . TONSILLECTOMY AND ADENOIDECTOMY    . TOTAL KNEE ARTHROPLASTY Left 12/17/2016   Procedure: LEFT TOTAL KNEE ARTHROPLASTY;  Surgeon: Gaynelle Arabian, MD;  Location: [facility];  Service: Orthopedics;  Laterality: Left;    Family History  Problem Relation Age of Onset  . Heart attack Father 54  . Stroke Father 72  . Diabetes Father   . Hypertension Father   . Heart disease Father        before age 26  . Breast cancer Mother   . Cancer Mother   . Diabetes Sister   . Cancer Sister   . Peripheral vascular disease Sister   . Breast cancer Sister   . Hypertension Son   . COPD Neg Hx   . Asthma Neg Hx   . Colon cancer Neg Hx   . Esophageal cancer Neg Hx   . Rectal cancer Neg Hx   . Stomach cancer Neg Hx     Social History   Socioeconomic History  . Marital status: Widowed    Spouse name: Not on file  . Number of children: 4  . Years of education: Not on file  . Highest education level: High school graduate  Occupational History  . Occupation: retired    Fish farm manager: Retired    Comment: Regulatory affairs officer  Tobacco Use  . Smoking status: Former Smoker    Years: 2.00    Types: Cigarettes    Quit date: 06/04/1973    Years since quitting: 46.0  . Smokeless tobacco: Never Used  . Tobacco comment: Smoked for 5 years 1965-1970 up to 1/2 pp week  Substance and Sexual Activity  . Alcohol use: Not Currently  . Drug use: No  . Sexual activity: Never  Other Topics Concern  . Not on file  Social History Narrative   03/10/19 Lives alone, no pets   Neighbors keep an eye on each other - 12 unit condo.    Family nearby.   Occ: retired Regulatory affairs officer   Activity: walking   Diet: good water, fruits/vegetables daily   Caffeine- some tea, occas soda   Social Determinants of  Health   Financial Resource Strain: Low Risk   . Difficulty of Paying Living Expenses: Not hard at all  Food Insecurity: No Food Insecurity  . Worried About Charity fundraiser  in the Last Year: Never true  . Ran Out of Food in the Last Year: Never true  Transportation Needs: No Transportation Needs  . Lack of Transportation (Medical): No  . Lack of Transportation (Non-Medical): No  Physical Activity: Sufficiently Active  . Days of Exercise per Week: 3 days  . Minutes of Exercise per Session: 60 min  Stress: No Stress Concern Present  . Feeling of Stress : Not at all  Social Connections:   . Frequency of Communication with Friends and Family: Not on file  . Frequency of Social Gatherings with Friends and Family: Not on file  . Attends Religious Services: Not on file  . Active Member of Clubs or Organizations: Not on file  . Attends Archivist Meetings: Not on file  . Marital Status: Not on file  Intimate Partner Violence: Not At Risk  . Fear of Current or Ex-Partner: No  . Emotionally Abused: No  . Physically Abused: No  . Sexually Abused: No   Review of Systems Eats little--has no taste Has lost 10# since October Drinks 1/2 of high protein shake for breakfast, cookies for lunch and then reasonable dinner    Objective:   Physical Exam  Constitutional: No distress.  Some wasting  Cardiovascular: Normal rate, regular rhythm and normal heart sounds. Exam reveals no gallop.  No murmur heard. Respiratory: Effort normal and breath sounds normal. No respiratory distress. She has no wheezes. She has no rales.  Musculoskeletal:        General: No tenderness or edema.  Psychiatric: She has a normal mood and affect. Her behavior is normal.           Assessment & Plan:

## 2019-06-02 NOTE — Telephone Encounter (Signed)
Noted! Thank you

## 2019-06-04 ENCOUNTER — Encounter: Payer: Self-pay | Admitting: Physical Therapy

## 2019-06-04 ENCOUNTER — Ambulatory Visit: Payer: Medicare Other

## 2019-06-04 ENCOUNTER — Other Ambulatory Visit: Payer: Self-pay

## 2019-06-04 DIAGNOSIS — R262 Difficulty in walking, not elsewhere classified: Secondary | ICD-10-CM | POA: Diagnosis not present

## 2019-06-04 DIAGNOSIS — R2681 Unsteadiness on feet: Secondary | ICD-10-CM

## 2019-06-04 DIAGNOSIS — R2689 Other abnormalities of gait and mobility: Secondary | ICD-10-CM

## 2019-06-04 NOTE — Therapy (Signed)
[place] MAIN [facility] SERVICES [address] [place], [place], [address] Phone: [phone]   Fax:  [phone]  Physical Therapy Treatment  Patient Details  Name: Taylor Castaneda MRN: [id number] Date of Birth: 1933/03/20 Referring Provider (PT): [name]   Encounter Date: 06/04/2019  PT End of Session - 06/04/19 0841    Visit Number  16    Number of Visits  17    Date for PT Re-Evaluation  06/09/19    PT Start Time  0845    PT Stop Time  0930    PT Time Calculation (min)  45 min    Equipment Utilized During Treatment  Gait belt    Activity Tolerance  Patient tolerated treatment well    Behavior During Therapy  [facility] for tasks assessed/performed       Past Medical History:  Diagnosis Date  . Anemia   . Diabetes mellitus, type II ([place])   . Glaucoma    Dr.Hecker  . History of colon cancer 1998   Dr. Lennie Hummer  . Hyperlipemia   . Osteopenia 02/2012, 03/2014   DEXA hip -2.2  . Renal insufficiency   . Seizures ([place])   . Stenosing tenosynovitis of finger of left hand 2016   index - s/p steroid injection x2    Past Surgical History:  Procedure Laterality Date  . ABI  11/2013   WNL  . APPENDECTOMY    . CARPAL TUNNEL RELEASE     Bilateral   . CATARACT EXTRACTION Bilateral 2006   Implants  . COLECTOMY  1998   For cancer  . COLONOSCOPY  2008   Dr.Stark, Due 2011  . COLONOSCOPY  06/2013   1 hyperplastic polyp, ileocecal anastomosis Fuller Plan)  . dexa  02/2012, 03/2014   T score -2.2 at hip overall stable  . REFRACTIVE SURGERY    . TONSILLECTOMY AND ADENOIDECTOMY    . TOTAL KNEE ARTHROPLASTY Left 12/17/2016   Procedure: LEFT TOTAL KNEE ARTHROPLASTY;  Surgeon: Gaynelle Arabian, MD;  Location: [facility];  Service: Orthopedics;  Laterality: Left;    There were no vitals filed for this visit.   Subjective Assessment - 06/04/19 0847    Subjective  Patient reported she is doing well today. Stated the MD was unable to tell her exactly  why she had her blank spell. They did decrease her BP medications per pt and chart but pt unable to recall by how much, PT requested an update next session. Pt without further inicident since PT visit.    Pertinent History  Pt is an 85 y/o female admitted to hospital on 03/05/19 after being found down in the floor by family members. MRI of the brain was negative for any acute findings. Neurology was consulted and suspect new onset seizures. Discharged from hospital on 03/07/19. PMH including but not limited to DM, HTN, glaucoma and colon cancer. Patient was independent with ADLs/IADLs prior to hospital admission with occasional use of cane for ambulation. Patient was able to perform bed mobility and transfers but unsteady with ambulation with 1 HHA x50 feet and gait deviation to right with decreased RLE step length, also demonstrated poor balance AEB standing balance Leahy scale score of 'poor'. Now living with her daughter, prior to admission was living alone.    Limitations  Standing;Walking;House hold activities    How long can you sit comfortably?  2-3 hours    How long can you stand comfortably?  5-10 minutes    How long  can you walk comfortably?  Able to walk around house about 8 times without SPC.    Diagnostic tests  MRI confirming absence of stroke    Patient Stated Goals  Wans to return to water aerobics and walking normally.    Currently in Pain?  No/denies       Vitals: 134/59 start of session 72: HR 100: SpO2  122/54 mid session 70: HR 100: spO2  111/55 end of session 71: HR 100: SpO2  131/48 at end of session (2 mins later) 74: HR 100: spO2   TREATMENT: Warm up on Nustep BUE/BLE level 3 x4 min (unbilled);   Exercise: Leg press: BLE plate 105# 6I68 with min VCs for proper positioning and to slow down LE movement for better motor control;  Leg press BLE heel raises 105# 2x10 with min VCs for proper positioning including to keep knee straight for better calf  strengthening;  NMR: Instructed patient in advanced balance exercise:  Weaving around cones: #6 x3 sets with cones close together to challenge dynamic balance with tight turns, required CGA for safety 1-2 LOB noted with minA to correct; Side stepping over cones #6 x3 sets each direction with min A for safety with multiple episodes of knocking over cones and decreased step length requiring mod VCs to increase hip flexion and increase step length for better dynamic balance;  Standing on 1/2 foam: (Flat side up) -heel/toe rocks with feet apart heel/toe rocks x15 with rail assist for safety -feet apart: Unsupported standing 30 sec hold with CGA for safety; Unsupported standing head turns side/side x10 reps with CGA for safety Unsupported standing alternate UE lift x10 reps each with CGA for safety Requiredmin verbalcues to improve ankle strategies to recover balance with posterior loss of balance with unsupported standing;  Resisted walking 7.5# forward/backward, side/side x4 way (x2 laps) each direction with min A for safety 70% of the time, CGA for remainder. Patient did require min-mod Vcs to increase step length for better gait safety. She reports increased cramping with backwards walking requiring short standing about the same compared to last session.   Response to treatment/clinical impression: The patient reported fatigue with session and requested several short seated rest breaks.  Most challenged by higher dynamic balance activities as well as resisted ambulation, weight decreased from last session due to patient fatigue. BP assessed at several times during session for safety; family informed to check her BP upon returning home and if diastolic blood pressure was still in the 40s to call her MD. Of note, the patient would also like her next session to be her last PT session.      PT Education - 06/04/19 0840    Education provided  Yes     Education Details  exercise form/technique    Person(s) Educated  Patient    Methods  Explanation;Verbal cues    Comprehension  Verbalized understanding;Returned demonstration;Verbal cues required;Need further instruction       PT Short Term Goals - 05/12/19 0321      PT SHORT TERM GOAL #1   Title  Patient will be independent in initial home exercise program to improve strength/mobility for better functional independence with ADLs.    Time  4    Period  Weeks    Status  Achieved    Target Date  04/28/19        PT Long Term Goals - 05/26/19 1156      PT LONG TERM GOAL #1   Title  Patient  will be independent in finalized home exercise program to improve strength/mobility for better functional independence with ADLs.    Baseline  12/8: HEP compliant    Time  8    Period  Weeks    Status  Achieved      PT LONG TERM GOAL #2   Title  Patient will increase 10 meter walk test to >1.42ms as to improve gait speed for better community ambulation and to reduce fall risk.    Baseline  03/31/19: 0.86 m/s; 12/8: 0.9 m/s; 12/22: 1.11 m/s, self selected .56 m/s    Time  8    Period  Weeks    Status  Partially Met    Target Date  06/09/19      PT LONG TERM GOAL #3   Title  Patient will increase Berg Balance score by > 6 points to demonstrate decreased fall risk during functional activities.    Baseline  03/31/19: 46/56; 12/8: 50/56; 12/22 52/56    Time  8    Period  Weeks    Status  Achieved      PT LONG TERM GOAL #4   Title  Patient will increase six minute walk test distance to >1000 for progression to community ambulator and improve gait ability    Baseline  12/8: 460 ft , 3 rest breaks due to calf cramps 12/22; no rest breaks this session, 5951f   Time  8    Period  Weeks    Status  Partially Met    Target Date  06/09/19            Plan - 06/04/19 [id number]  Clinical Impression Statement  Reported that she was able to park further away and walk to the clinic which is a  further distance (~10061f She is also not using a walker or a cane. During session the patient attempted bridges, which caused a back spasm, that returned to baseline with lower trunk rotations. Overall the patient reported that her back pain was completely resolved at end of session. The patient would benefit from further skilled PT intervention to continue to progress towards goals (carried forward due to recent assessment this visit) and to maximize functional abilities, decrease risk of falls and continued pain management.    Personal Factors and Comorbidities  Age;Comorbidity 3+    Comorbidities  HTN, DM, cancer (colon), hx of siezure    Examination-Activity Limitations  Carry;Reach Overhead;Locomotion Level;Lift;Stairs;Stand    Examination-Participation Restrictions  Yard Work;Community Activity    Stability/Clinical Decision Making  Stable/Uncomplicated    Rehab Potential  Excellent    PT Frequency  2x / week    PT Duration  2 weeks    PT Treatment/Interventions  ADLs/Self Care Home Management;Aquatic Therapy;Moist Heat;DME Instruction;Gait training;Stair training;Functional mobility training;Therapeutic activities;Therapeutic exercise;Balance training;Neuromuscular re-education;Patient/family education;Manual techniques    PT Next Visit Plan  Static and dynamic balance activities, challenge SLS and step negotiation    PT Home Exercise Plan  Tandem stance hold, semi-tandem head turns, hip ext and abduction greenTB    Consulted and Agree with Plan of Care  Patient    Family Member Consulted  Daughter       Patient will benefit from skilled therapeutic intervention in order to improve the following deficits and impairments:  Abnormal gait, Decreased balance, Decreased endurance, Decreased mobility, Difficulty walking, Cardiopulmonary status limiting activity, Decreased strength, Decreased coordination, Decreased activity tolerance  Visit Diagnosis: Unsteadiness on feet  Difficulty in  walking, not elsewhere classified  Other abnormalities of gait and mobility     Problem List Patient Active Problem List   Diagnosis Date Noted  . Hypotension 06/02/2019  . Malnutrition of mild degree ([place]) 06/02/2019  . Seizure ([place]) 03/18/2019  . Transient atrial fibrillation ([place]) 03/18/2019  . MCI (mild cognitive impairment) 03/18/2019  . Status epilepticus ([place]) 03/05/2019  . Weight loss 01/28/2019  . Acute gout 04/29/2017  . OA (osteoarthritis) of knee 12/17/2016  . Dry skin dermatitis 06/13/2016  . Health maintenance examination 01/30/2016  . Medicare annual wellness visit, subsequent 03/10/2014  . Advanced care planning/counseling discussion 03/10/2014  . PAD (peripheral artery disease) ([place]) 11/11/2013  . Dyspnea 09/18/2011  . PMR (polymyalgia rheumatica) ([place]) 08/27/2011  . Controlled type 2 diabetes mellitus with diabetic nephropathy, with long-term current use of insulin ([place]) 08/04/2009  . Essential hypertension 06/13/2009  . CKD stage 3 due to type 2 diabetes mellitus ([place]) 01/24/2009  . Primary angle-closure glaucoma 11/13/2007  . Osteopenia 11/13/2007  . History of colon cancer 11/13/2007  . HLD (hyperlipidemia) 10/23/2006    Lieutenant Diego PT, DPT 2:54 PM,06/04/19   [place] MAIN [facility] SERVICES [address] [place], [place], [address] Phone: [phone]   Fax:  [phone]  Name: Taylor Castaneda MRN: [id number] Date of Birth: 11-09-1932

## 2019-06-09 ENCOUNTER — Other Ambulatory Visit: Payer: Self-pay

## 2019-06-09 ENCOUNTER — Ambulatory Visit: Payer: Medicare Other | Attending: Family Medicine

## 2019-06-09 DIAGNOSIS — R2689 Other abnormalities of gait and mobility: Secondary | ICD-10-CM | POA: Insufficient documentation

## 2019-06-09 DIAGNOSIS — R2681 Unsteadiness on feet: Secondary | ICD-10-CM | POA: Diagnosis not present

## 2019-06-09 DIAGNOSIS — R262 Difficulty in walking, not elsewhere classified: Secondary | ICD-10-CM | POA: Diagnosis not present

## 2019-06-09 NOTE — Therapy (Signed)
[place] MAIN [facility] SERVICES [address] [place], [place], [address] Phone: [phone]   Fax:  [phone]  Physical Therapy Treatment and Discharge Summary   Patient Details  Name: Taylor Castaneda MRN: [id number] Date of Birth: 31-Dec-1932 Referring Provider (PT): [name]   Encounter Date: 06/09/2019  PT End of Session - 06/09/19 1116    Visit Number  17    Number of Visits  17    Date for PT Re-Evaluation  06/09/19    PT Start Time  1107    PT Stop Time  1130    PT Time Calculation (min)  23 min    Activity Tolerance  Patient tolerated treatment well;No increased pain    Behavior During Therapy  WFL for tasks assessed/performed       Past Medical History:  Diagnosis Date  . Anemia   . Diabetes mellitus, type II ([place])   . Glaucoma    Dr.Hecker  . History of colon cancer 1998   Dr. Lennie Hummer  . Hyperlipemia   . Osteopenia 02/2012, 03/2014   DEXA hip -2.2  . Renal insufficiency   . Seizures ([place])   . Stenosing tenosynovitis of finger of left hand 2016   index - s/p steroid injection x2    Past Surgical History:  Procedure Laterality Date  . ABI  11/2013   WNL  . APPENDECTOMY    . CARPAL TUNNEL RELEASE     Bilateral   . CATARACT EXTRACTION Bilateral 2006   Implants  . COLECTOMY  1998   For cancer  . COLONOSCOPY  2008   Dr.Stark, Due 2011  . COLONOSCOPY  06/2013   1 hyperplastic polyp, ileocecal anastomosis [facility] Plan)  . dexa  02/2012, 03/2014   T score -2.2 at hip overall stable  . REFRACTIVE SURGERY    . TONSILLECTOMY AND ADENOIDECTOMY    . TOTAL KNEE ARTHROPLASTY Left 12/17/2016   Procedure: LEFT TOTAL KNEE ARTHROPLASTY;  Surgeon: Gaynelle Arabian, MD;  Location: [facility];  Service: Orthopedics;  Laterality: Left;    There were no vitals filed for this visit.  Subjective Assessment - 06/09/19 1109    Subjective  Pt doing well today, no recent updates. Pt denies pain.    Pertinent History  Pt is an 86 y/o female  admitted to hospital on 03/05/19 after being found down in the floor by family members. MRI of the brain was negative for any acute findings. Neurology was consulted and suspect new onset seizures. Discharged from hospital on 03/07/19. PMH including but not limited to DM, HTN, glaucoma and colon cancer. Patient was independent with ADLs/IADLs prior to hospital admission with occasional use of cane for ambulation. Patient was able to perform bed mobility and transfers but unsteady with ambulation with 1 HHA x50 feet and gait deviation to right with decreased RLE step length, also demonstrated poor balance AEB standing balance Leahy scale score of 'poor'. Now living with her daughter, prior to admission was living alone.    Currently in Pain?  No/denies       TREATMENT: -overground AMB, 75ft, no device, 0.66m/s  -leg press 2x10 120lb *updated resistance this session -leg press ankle PF 1x15 @ 120lbs  5xSTS: 8.76sec hands free (12sec at eval)  TUG: 10.69sec (13.2sec at eval)      PT Short Term Goals - 05/12/19 1937      PT SHORT TERM GOAL #1   Title  Patient will be independent in initial home exercise  program to improve strength/mobility for better functional independence with ADLs.    Time  4    Period  Weeks    Status  Achieved    Target Date  04/28/19        PT Long Term Goals - 06/09/19 1118      PT LONG TERM GOAL #1   Title  Patient will be independent in finalized home exercise program to improve strength/mobility for better functional independence with ADLs.    Baseline  12/8: HEP compliant    Time  8    Period  Weeks    Status  Achieved    Target Date  05/25/20      PT LONG TERM GOAL #2   Title  Patient will increase 10 meter walk test to >1.85m/s as to improve gait speed for better community ambulation and to reduce fall risk.    Baseline  03/31/19: 0.86 m/s; 12/8: 0.9 m/s; 12/22: 1.11 m/s, self selected .56 m/s    Time  8    Period  Weeks    Status  Achieved     Target Date  06/09/19      PT LONG TERM GOAL #3   Title  Patient will increase Berg Balance score by > 6 points to demonstrate decreased fall risk during functional activities.    Baseline  03/31/19: 46/56; 12/8: 50/56; 12/22 52/56    Time  8    Period  Weeks    Status  Achieved      PT LONG TERM GOAL #4   Title  Patient will increase six minute walk test distance to >1000 for progression to community ambulator and improve gait ability    Baseline  12/8: 460 ft , 3 rest breaks due to calf cramps 12/22; no rest breaks this session, 561ft; on 06/08/18 able to AMB 799ft  in 0.18m/s.    Time  8    Period  Weeks    Status  Achieved            Plan - 06/09/19 1131    Clinical Impression Statement  Pt reports she feels ready for DC this date. Extensive progress demonstrated by functional outcome measures and testing. Pt reports confidence in independent HEP performance going forward. Pt will be DC from PT.    Personal Factors and Comorbidities  Age;Comorbidity 3+    Comorbidities  HTN, DM, cancer (colon), hx of siezure    Examination-Activity Limitations  Carry;Reach Overhead;Locomotion Level;Lift;Stairs;Stand    Examination-Participation Restrictions  Yard Work;Community Activity    Stability/Clinical Decision Making  Stable/Uncomplicated    Clinical Decision Making  Low    Rehab Potential  Excellent    PT Frequency  2x / week    PT Duration  2 weeks    PT Treatment/Interventions  ADLs/Self Care Home Management;Aquatic Therapy;Moist Heat;DME Instruction;Gait training;Stair training;Functional mobility training;Therapeutic activities;Therapeutic exercise;Balance training;Neuromuscular re-education;Patient/family education;Manual techniques    PT Next Visit Plan  Pt now DC    PT Home Exercise Plan  Tandem stance hold, semi-tandem head turns, hip ext and abduction greenTB    Consulted and Agree with Plan of Care  Patient       Patient will benefit from skilled therapeutic intervention  in order to improve the following deficits and impairments:  Abnormal gait, Decreased balance, Decreased endurance, Decreased mobility, Difficulty walking, Cardiopulmonary status limiting activity, Decreased strength, Decreased coordination, Decreased activity tolerance  Visit Diagnosis: Unsteadiness on feet  Difficulty in walking, not elsewhere classified  Other abnormalities  of gait and mobility     Problem List Patient Active Problem List   Diagnosis Date Noted  . Hypotension 06/02/2019  . Malnutrition of mild degree ([place]) 06/02/2019  . Seizure ([place]) 03/18/2019  . Transient atrial fibrillation ([place]) 03/18/2019  . MCI (mild cognitive impairment) 03/18/2019  . Status epilepticus ([place]) 03/05/2019  . Weight loss 01/28/2019  . Acute gout 04/29/2017  . OA (osteoarthritis) of knee 12/17/2016  . Dry skin dermatitis 06/13/2016  . Health maintenance examination 01/30/2016  . Medicare annual wellness visit, subsequent 03/10/2014  . Advanced care planning/counseling discussion 03/10/2014  . PAD (peripheral artery disease) ([place]) 11/11/2013  . Dyspnea 09/18/2011  . PMR (polymyalgia rheumatica) ([place]) 08/27/2011  . Controlled type 2 diabetes mellitus with diabetic nephropathy, with long-term current use of insulin ([place]) 08/04/2009  . Essential hypertension 06/13/2009  . CKD stage 3 due to type 2 diabetes mellitus ([place]) 01/24/2009  . Primary angle-closure glaucoma 11/13/2007  . Osteopenia 11/13/2007  . History of colon cancer 11/13/2007  . HLD (hyperlipidemia) 10/23/2006   11:35 AM, 06/09/19 Etta Grandchild, PT, DPT Physical Therapist - [facility]  Outpatient Physical Therapy- [place] [phone]     Etta Grandchild 06/09/2019, 11:35 AM  [place] MAIN [facility] SERVICES [address] [place], [place], [address] Phone: [phone]   Fax:  [phone]  Name: Taylor Castaneda MRN: [id number] Date of Birth:  10-10-1932

## 2019-06-16 ENCOUNTER — Ambulatory Visit: Payer: Medicare Other | Admitting: Physical Therapy

## 2019-06-23 ENCOUNTER — Ambulatory Visit: Payer: Medicare Other | Admitting: Physical Therapy

## 2019-07-01 ENCOUNTER — Ambulatory Visit: Payer: Medicare Other | Admitting: Physical Therapy

## 2019-07-06 ENCOUNTER — Ambulatory Visit: Payer: Medicare Other | Admitting: Physical Therapy

## 2019-07-08 ENCOUNTER — Ambulatory Visit: Payer: Medicare Other | Admitting: Physical Therapy

## 2019-07-13 ENCOUNTER — Ambulatory Visit: Payer: Medicare Other | Admitting: Physical Therapy

## 2019-07-15 ENCOUNTER — Ambulatory Visit: Payer: Medicare Other | Admitting: Physical Therapy

## 2019-07-20 ENCOUNTER — Ambulatory Visit: Payer: Medicare Other | Admitting: Physical Therapy

## 2019-07-22 ENCOUNTER — Ambulatory Visit: Payer: Medicare Other | Admitting: Physical Therapy

## 2019-07-27 ENCOUNTER — Ambulatory Visit: Payer: Medicare Other | Admitting: Physical Therapy

## 2019-07-29 ENCOUNTER — Ambulatory Visit: Payer: Medicare Other | Admitting: Physical Therapy

## 2019-08-19 ENCOUNTER — Ambulatory Visit (INDEPENDENT_AMBULATORY_CARE_PROVIDER_SITE_OTHER): Payer: Medicare Other | Admitting: Family Medicine

## 2019-08-19 ENCOUNTER — Encounter: Payer: Self-pay | Admitting: Family Medicine

## 2019-08-19 ENCOUNTER — Other Ambulatory Visit: Payer: Self-pay

## 2019-08-19 VITALS — BP 140/62 | HR 62 | Temp 97.5°F | Ht 60.0 in | Wt 116.1 lb

## 2019-08-19 DIAGNOSIS — I9589 Other hypotension: Secondary | ICD-10-CM

## 2019-08-19 DIAGNOSIS — I10 Essential (primary) hypertension: Secondary | ICD-10-CM

## 2019-08-19 DIAGNOSIS — E1121 Type 2 diabetes mellitus with diabetic nephropathy: Secondary | ICD-10-CM

## 2019-08-19 DIAGNOSIS — E441 Mild protein-calorie malnutrition: Secondary | ICD-10-CM | POA: Diagnosis not present

## 2019-08-19 DIAGNOSIS — Z794 Long term (current) use of insulin: Secondary | ICD-10-CM

## 2019-08-19 MED ORDER — FUROSEMIDE 20 MG PO TABS: 20.0000 mg | ORAL_TABLET | Freq: Every day | ORAL | 1 refills | Status: DC

## 2019-08-19 NOTE — Patient Instructions (Addendum)
I do recommend covid vaccine.  Look at this website for information: ShippingScam.co.uk Or if you want to sign up at the [facility] site: www.[facility]vax.org  You are doing well today Continue current medicines. Return as needed or after 03/02/2020 for your wellness visit/physical.

## 2019-08-19 NOTE — Assessment & Plan Note (Signed)
Chronic, stable. Continue current regimen. 

## 2019-08-19 NOTE — Progress Notes (Signed)
This visit was conducted in person.  BP 140/62 (BP Location: Left Arm, Patient Position: Sitting, Cuff Size: Normal)   Pulse 62   Temp (!) 97.5 F (36.4 C) (Temporal)   Ht 5' (1.524 m)   Wt 116 lb 1 oz (52.6 kg)   SpO2 99%   BMI 22.67 kg/m    CC: 4 mo f/u visit  Subjective:    Patient ID: Taylor Castaneda, female    DOB: 04/01/1933, 86 y.o.   MRN: [id number]  HPI: Taylor Castaneda is a 86 y.o. female presenting on 08/19/2019 for Hypertension (Here for 4 mo f/u.  Pt accompanied by daughter, Taylor Castaneda- temp 97.7.)   HTN - Compliant with current antihypertensive regimen of ramipril 5mg  daily, carvedilol 12.5mg  bid, and lasix 20mg  daily with potassium 20mEq MWF. Does check blood pressures at home: "pretty good". No low blood pressure readings or symptoms of dizziness/syncope. Denies HA, vision changes, CP/tightness, SOB, leg swelling.    DM - now on lantus 16 at night time, humalog 1-10u TID per sliding scale through endo (this is lower than previous 30u lantus dose).   Eating better. She has moved back home 07/2019 - now again living alone but wears safety alert button daily. Son eats lunch with her every day - which has helped. She is taking a glucerna or atkins shake every morning for breakfast.       Relevant past medical, surgical, family and social history reviewed and updated as indicated. Interim medical history since our last visit reviewed. Allergies and medications reviewed and updated. Outpatient Medications Prior to Visit  Medication Sig Dispense Refill  . aspirin 81 MG EC tablet Take 1 tablet (81 mg total) by mouth daily. Swallow whole. 30 tablet 0  . atorvastatin (LIPITOR) 40 MG tablet Take 1 tablet (40 mg total) by mouth daily at 6 PM. 1 tablet 0  . B-D ULTRAFINE III SHORT PEN 31G X 8 MM MISC USE AS DIRECTED WITH INSULIN 100 each 3  . Biotin 10000 MCG TABS Take 1 tablet by mouth daily.    . Carboxymethylcellul-Glycerin (CLEAR EYES FOR DRY EYES) 1-0.25 % SOLN Place 1-2 drops  into both eyes 3 (three) times daily as needed (for dry/irritated eyes.).    Marland Kitchen carvedilol (COREG) 12.5 MG tablet Take 1 tablet (12.5 mg total) by mouth 2 (two) times daily. 180 tablet 1  . Cholecalciferol (VITAMIN D3) 25 MCG (1000 UT) CAPS Take 1 capsule (1,000 Units total) by mouth daily. 30 capsule   . Cinnamon 500 MG capsule Take 500 mg by mouth daily.    . colchicine 0.6 MG tablet Take 1 tablet (0.6 mg total) by mouth daily as needed (gout flare). First day of gout flare, may take 1 tablet twice daily. 30 tablet 1  . Ferrous Sulfate (IRON) 325 (65 Fe) MG TABS Take 1 tablet by mouth 2 (two) times a week. Takes Mon and [employer]    . HUMALOG KWIKPEN 100 UNIT/ML KiwkPen Inject 1-10 Units into the skin 3 (three) times daily before meals. Sliding scale  4  . LANTUS SOLOSTAR 100 UNIT/ML Solostar Pen Inject 16 Units into the skin at bedtime.    . levETIRAcetam (KEPPRA) 250 MG tablet Take 1 tablet (250 mg total) by mouth 2 (two) times daily. 180 tablet 4  . Multiple Vitamins-Minerals (PRESERVISION AREDS 2 PO) Take by mouth 2 (two) times daily.    . potassium chloride (K-DUR) 10 MEQ tablet TAKE 1  BY MOUTH ON MONDAY, WEDNESDAY AND FRIDAY (  Patient taking differently: Take 10 mEq by mouth every Monday, Wednesday, and Friday. ) 36 tablet 3  . ramipril (ALTACE) 5 MG capsule Take 5 mg by mouth daily.    . furosemide (LASIX) 40 MG tablet Take 0.5 tablets (20 mg total) by mouth daily. 45 tablet 3  . LANTUS SOLOSTAR 100 UNIT/ML Solostar Pen Inject 30 Units into the skin at bedtime.    Marland Kitchen glucose blood (FREESTYLE LITE) test strip Use as instructed (Patient taking differently: 1 each by Other route 4 (four) times daily. Use as instructed) 100 each 2   No facility-administered medications prior to visit.     Per HPI unless specifically indicated in ROS section below Review of Systems Objective:    BP 140/62 (BP Location: Left Arm, Patient Position: Sitting, Cuff Size: Normal)   Pulse 62   Temp (!) 97.5 F (36.4  C) (Temporal)   Ht 5' (1.524 m)   Wt 116 lb 1 oz (52.6 kg)   SpO2 99%   BMI 22.67 kg/m   Wt Readings from Last 3 Encounters:  08/19/19 116 lb 1 oz (52.6 kg)  06/02/19 113 lb (51.3 kg)  04/21/19 117 lb 8 oz (53.3 kg)    Physical Exam Vitals and nursing note reviewed.  Constitutional:      Appearance: Normal appearance. She is not ill-appearing.  Cardiovascular:     Rate and Rhythm: Normal rate and regular rhythm.     Pulses: Normal pulses.     Heart sounds: Normal heart sounds. No murmur.  Pulmonary:     Effort: Pulmonary effort is normal. No respiratory distress.     Breath sounds: Normal breath sounds. No wheezing, rhonchi or rales.  Musculoskeletal:     Right lower leg: No edema.     Left lower leg: No edema.  Skin:    Findings: No rash.  Neurological:     Mental Status: She is alert.  Psychiatric:        Mood and Affect: Mood normal.        Behavior: Behavior normal.       Results for orders placed or performed in visit on 16/10/96  Basic metabolic panel  Result Value Ref Range   Creatinine 1.4 (A) 0.5 - 1.1  Hemoglobin A1c  Result Value Ref Range   Hemoglobin A1C 7    Lab Results  Component Value Date   CREATININE 1.4 (A) 04/06/2019   BUN 29 (H) 03/06/2019   NA 138 03/06/2019   K 4.3 03/06/2019   CL 105 03/06/2019   CO2 20 (L) 03/06/2019    Assessment & Plan:  This visit occurred during the SARS-CoV-2 public health emergency.  Safety protocols were in place, including screening questions prior to the visit, additional usage of staff PPE, and extensive cleaning of exam room while observing appropriate contact time as indicated for disinfecting solutions.  Discussed COVID vaccines. Info provided on local sites where she and family may receive injection.  Problem List Items Addressed This Visit    Malnutrition of mild degree ([place])    Significant improvement with more regular meals, with some weight gain noted. Continue glucerna/atkins daily.        Hypotension    No further episodes.       Relevant Medications   furosemide (LASIX) 20 MG tablet   Essential hypertension - Primary    Chronic, stable. Continue current regimen.       Relevant Medications   furosemide (LASIX) 20 MG tablet   Controlled  type 2 diabetes mellitus with diabetic nephropathy, with long-term current use of insulin ([place])    Appreciate endo care.  Insulin doses have been adjusted, no further low sugars or hypoglycemic symptoms.       Relevant Medications   LANTUS SOLOSTAR 100 UNIT/ML Solostar Pen       Meds ordered this encounter  Medications  . furosemide (LASIX) 20 MG tablet    Sig: Take 1 tablet (20 mg total) by mouth daily.    Dispense:  90 tablet    Refill:  1   No orders of the defined types were placed in this encounter.   Patient Instructions  I do recommend covid vaccine.  Look at this website for information: ShippingScam.co.uk Or if you want to sign up at the [facility] site: www.[place].org  You are doing well today Continue current medicines. Return as needed or after 03/02/2020 for your wellness visit/physical.    Follow up plan: Return in about 6 months (around 02/19/2020) for annual exam, prior fasting for blood work, medicare wellness visit.  [name], MD

## 2019-08-19 NOTE — Assessment & Plan Note (Signed)
Appreciate endo care.  Insulin doses have been adjusted, no further low sugars or hypoglycemic symptoms.

## 2019-08-19 NOTE — Assessment & Plan Note (Signed)
No further episodes

## 2019-08-19 NOTE — Assessment & Plan Note (Addendum)
Significant improvement with more regular meals, with some weight gain noted. Continue glucerna/atkins daily.

## 2019-08-31 ENCOUNTER — Ambulatory Visit: Payer: Medicare Other | Attending: Internal Medicine

## 2019-08-31 DIAGNOSIS — Z23 Encounter for immunization: Secondary | ICD-10-CM

## 2019-08-31 NOTE — Progress Notes (Signed)
   Covid-19 Vaccination Clinic  Name:  Taylor Castaneda    MRN: [id number] DOB: 01-10-1933  08/31/2019  Ms. Kirchner was observed post Covid-19 immunization for 15 minutes without incident. She was provided with Vaccine Information Sheet and instruction to access the V-Safe system.   Ms. Mcgowen was instructed to call 911 with any severe reactions post vaccine: Marland Kitchen Difficulty breathing  . Swelling of face and throat  . A fast heartbeat  . A bad rash all over body  . Dizziness and weakness   Immunizations Administered    Name Date Dose VIS Date Route   Pfizer COVID-19 Vaccine 08/31/2019  2:40 PM 0.3 mL 05/15/2019 Intramuscular   Manufacturer: [place]   Lot: [id number]   [place]: [address]-5

## 2019-09-08 ENCOUNTER — Encounter: Payer: Self-pay | Admitting: Family Medicine

## 2019-09-08 ENCOUNTER — Other Ambulatory Visit: Payer: Self-pay

## 2019-09-08 ENCOUNTER — Ambulatory Visit: Payer: Medicare Other | Admitting: Family Medicine

## 2019-09-08 VITALS — BP 171/65 | HR 64 | Temp 97.6°F | Ht 60.0 in | Wt 113.8 lb

## 2019-09-08 DIAGNOSIS — R569 Unspecified convulsions: Secondary | ICD-10-CM | POA: Diagnosis not present

## 2019-09-08 NOTE — Progress Notes (Signed)
PATIENT: Taylor Castaneda DOB: 01-17-1933  REASON FOR VISIT: follow up HISTORY FROM: patient  Chief Complaint  Patient presents with  . Follow-up    Rm corner, daugher, [name], no seizures  . Seizures     HISTORY OF PRESENT ILLNESS: Today 09/09/19 Taylor Castaneda is a 86 y.o. female here today for follow up for seizures. She is doing well. She continues levetiracetam 250mg  twice daily. No obvious adverse effects. No seizure activity. She is living alone, again. She lives in a [place]. She uses a medical alert necklace in the event of an emergency. She feels that memory is stable. She is able to perform ADL's independently. She has not driven since seizure but wishes to resume local driving as needed.   HISTORY: (copied from Dr Gladstone Lighter note on 03/10/2019)  86 year old female here for evaluation of seizure.  Patient was found unresponsive at home on 03/05/2019 at 8:00 in the evening by her family who was trying to reach her by phone.  Neighbor noted she was acting erratically around 4 PM.  She was found on the kitchen floor with eye movements, shaking and convulsions.  Blood sugar at home was 202.  Patient was taken to the hospital for evaluation.  Patient had right gaze preference and left neglect.  Code stroke was activated.  Patient was diagnosed with status epilepticus and treated with antiseizure medications.  MRI of the brain showed no acute findings.  Of note patient has had some mild memory problems in the last few months.  She was living alone.  She was able to drive, take care of ADLs.  Now patient living with daughter.  She is not driving.  She is tolerating levetiracetam 250 mg twice a day.   REVIEW OF SYSTEMS: Out of a complete 14 system review of symptoms, the patient complains only of the following symptoms, seizure, and all other reviewed systems are negative.   ALLERGIES: Allergies  Allergen Reactions  . Pravastatin Sodium     REACTION: anemia as per Dr Gilford Rile,  [facility] MEDICATIONS: Outpatient Medications Prior to Visit  Medication Sig Dispense Refill  . aspirin 81 MG EC tablet Take 1 tablet (81 mg total) by mouth daily. Swallow whole. 30 tablet 0  . atorvastatin (LIPITOR) 40 MG tablet Take 1 tablet (40 mg total) by mouth daily at 6 PM. 1 tablet 0  . B-D ULTRAFINE III SHORT PEN 31G X 8 MM MISC USE AS DIRECTED WITH INSULIN 100 each 3  . Biotin 10000 MCG TABS Take 1 tablet by mouth daily.    . Carboxymethylcellul-Glycerin (CLEAR EYES FOR DRY EYES) 1-0.25 % SOLN Place 1-2 drops into both eyes 3 (three) times daily as needed (for dry/irritated eyes.).    Marland Kitchen carvedilol (COREG) 12.5 MG tablet Take 1 tablet (12.5 mg total) by mouth 2 (two) times daily. 180 tablet 1  . Cholecalciferol (VITAMIN D3) 25 MCG (1000 UT) CAPS Take 1 capsule (1,000 Units total) by mouth daily. 30 capsule   . Cinnamon 500 MG capsule Take 500 mg by mouth daily.    . colchicine 0.6 MG tablet Take 1 tablet (0.6 mg total) by mouth daily as needed (gout flare). First day of gout flare, may take 1 tablet twice daily. 30 tablet 1  . Ferrous Sulfate (IRON) 325 (65 Fe) MG TABS Take 1 tablet by mouth 2 (two) times a week. Takes Mon and [employer]    . furosemide (LASIX) 20 MG tablet Take 1 tablet (20  mg total) by mouth daily. 90 tablet 1  . HUMALOG KWIKPEN 100 UNIT/ML KiwkPen Inject 1-10 Units into the skin 3 (three) times daily before meals. Sliding scale  4  . LANTUS SOLOSTAR 100 UNIT/ML Solostar Pen Inject 16 Units into the skin at bedtime.    . levETIRAcetam (KEPPRA) 250 MG tablet Take 1 tablet (250 mg total) by mouth 2 (two) times daily. 180 tablet 4  . Multiple Vitamins-Minerals (PRESERVISION AREDS 2 PO) Take by mouth 2 (two) times daily.    . potassium chloride (K-DUR) 10 MEQ tablet TAKE 1  BY MOUTH ON MONDAY, [facility] AND FRIDAY (Patient taking differently: Take 10 mEq by mouth every Monday, Wednesday, and Friday. ) 36 tablet 3  . ramipril (ALTACE) 5 MG capsule Take 5 mg by mouth  daily.     No facility-administered medications prior to visit.    PAST MEDICAL HISTORY: Past Medical History:  Diagnosis Date  . Anemia   . Diabetes mellitus, type II ([place])   . Glaucoma    Dr.Hecker  . History of colon cancer 1998   Dr. Lennie Hummer  . Hyperlipemia   . Osteopenia 02/2012, 03/2014   DEXA hip -2.2  . Renal insufficiency   . Seizures ([place])   . Stenosing tenosynovitis of finger of left hand 2016   index - s/p steroid injection x2    PAST SURGICAL HISTORY: Past Surgical History:  Procedure Laterality Date  . ABI  11/2013   WNL  . APPENDECTOMY    . CARPAL TUNNEL RELEASE     Bilateral   . CATARACT EXTRACTION Bilateral 2006   Implants  . COLECTOMY  1998   For cancer  . COLONOSCOPY  2008   Dr.Stark, Due 2011  . COLONOSCOPY  06/2013   1 hyperplastic polyp, ileocecal anastomosis [facility])  . dexa  02/2012, 03/2014   T score -2.2 at hip overall stable  . REFRACTIVE SURGERY    . TONSILLECTOMY AND ADENOIDECTOMY    . TOTAL KNEE ARTHROPLASTY Left 12/17/2016   Procedure: LEFT TOTAL KNEE ARTHROPLASTY;  Surgeon: Gaynelle Arabian, MD;  Location: [facility];  Service: Orthopedics;  Laterality: Left;    FAMILY HISTORY: Family History  Problem Relation Age of Onset  . Heart attack Father 77  . Stroke Father 14  . Diabetes Father   . Hypertension Father   . Heart disease Father        before age 46  . Breast cancer Mother   . Cancer Mother   . Diabetes Sister   . Cancer Sister   . Peripheral vascular disease Sister   . Breast cancer Sister   . Hypertension Son   . COPD Neg Hx   . Asthma Neg Hx   . Colon cancer Neg Hx   . Esophageal cancer Neg Hx   . Rectal cancer Neg Hx   . Stomach cancer Neg Hx     SOCIAL HISTORY: Social History   Socioeconomic History  . Marital status: Widowed    Spouse name: Not on file  . Number of children: 4  . Years of education: Not on file  . Highest education level: High school graduate  Occupational History  . Occupation: retired     Fish farm manager: Retired    Comment: Regulatory affairs officer  Tobacco Use  . Smoking status: Former Smoker    Years: 2.00    Types: Cigarettes    Quit date: 06/04/1973    Years since quitting: 46.2  . Smokeless tobacco: Never Used  . Tobacco  comment: Smoked for 5 years 1965-1970 up to 1/2 pp week  Substance and Sexual Activity  . Alcohol use: Not Currently  . Drug use: No  . Sexual activity: Never  Other Topics Concern  . Not on file  Social History Narrative   03/10/19 Lives alone, no pets   Neighbors keep an eye on each other - 12 unit condo.    Family nearby.   Occ: retired Regulatory affairs officer   Activity: walking   Diet: good water, fruits/vegetables daily   Caffeine- some tea, occas soda   Social Determinants of Health   Financial Resource Strain: Low Risk   . Difficulty of Paying Living Expenses: Not hard at all  Food Insecurity: No Food Insecurity  . Worried About Charity fundraiser in the Last Year: Never true  . Ran Out of Food in the Last Year: Never true  Transportation Needs: No Transportation Needs  . Lack of Transportation (Medical): No  . Lack of Transportation (Non-Medical): No  Physical Activity: Sufficiently Active  . Days of Exercise per Week: 3 days  . Minutes of Exercise per Session: 60 min  Stress: No Stress Concern Present  . Feeling of Stress : Not at all  Social Connections:   . Frequency of Communication with Friends and Family:   . Frequency of Social Gatherings with Friends and Family:   . Attends Religious Services:   . Active Member of Clubs or Organizations:   . Attends Archivist Meetings:   Marland Kitchen Marital Status:   Intimate Partner Violence: Not At Risk  . Fear of Current or Ex-Partner: No  . Emotionally Abused: No  . Physically Abused: No  . Sexually Abused: No      PHYSICAL EXAM  Vitals:   09/08/19 1455  BP: (!) 171/65  Pulse: 64  Temp: 97.6 F (36.4 C)  Weight: 113 lb 12.8 oz (51.6 kg)  Height: 5' (1.524 m)   Body mass index is 22.23  kg/m.  Generalized: Well developed, in no acute distress  Cardiology: normal rate and rhythm, no murmur noted Respiratory: clear to auscultation bilaterally  Neurological examination  Mentation: Alert oriented to time, place, history taking. Follows all commands speech and language fluent Cranial nerve II-XII: Pupils were equal round reactive to light. Extraocular movements were full, visual field were full on confrontational test. Facial sensation and strength were normal. Head turning and shoulder shrug  were normal and symmetric. Motor: The motor testing reveals 5 over 5 strength of all 4 extremities. Good symmetric motor tone is noted throughout.  Sensory: Sensory testing is intact to soft touch on all 4 extremities. No evidence of extinction is noted.  Coordination: Cerebellar testing reveals good finger-nose-finger and heel-to-shin bilaterally.  Gait and station: Gait is normal.   DIAGNOSTIC DATA (LABS, IMAGING, TESTING) - I reviewed patient records, labs, notes, testing and imaging myself where available.  MMSE - Mini Mental State Exam 09/08/2019 03/10/2019 02/26/2019  Orientation to time 5 4 5   Orientation to Place 2 4 5   Registration 3 3 3   Attention/ Calculation 5 1 5   Recall 3 2 3   Language- name 2 objects 2 2 -  Language- repeat 1 1 1   Language- follow 3 step command 3 3 -  Language- read & follow direction 1 1 -  Write a sentence 1 1 -  Copy design 1 0 -  Total score 27 22 -     Lab Results  Component Value Date   WBC 12.8 (H) 03/06/2019  HGB 11.7 (L) 03/06/2019   HCT 35.9 (L) 03/06/2019   MCV 95.0 03/06/2019   PLT 178 03/06/2019      Component Value Date/Time   NA 138 03/06/2019 0312   K 4.3 03/06/2019 0312   CL 105 03/06/2019 0312   CO2 20 (L) 03/06/2019 0312   GLUCOSE 166 (H) 03/06/2019 0312   BUN 29 (H) 03/06/2019 0312   CREATININE 1.4 (A) 04/06/2019 0000   CREATININE 1.25 (H) 03/06/2019 0312   CALCIUM 8.8 (L) 03/06/2019 0312   PROT 6.0 (L) 03/06/2019  0312   ALBUMIN 3.1 (L) 03/06/2019 0312   AST 19 03/06/2019 0312   ALT 17 03/06/2019 0312   ALKPHOS 91 03/06/2019 0312   BILITOT 0.8 03/06/2019 0312   GFRNONAA 39 (L) 03/06/2019 0312   GFRAA 46 (L) 03/06/2019 0312   Lab Results  Component Value Date   CHOL 275 (H) 02/26/2019   HDL 39.80 02/26/2019   LDLCALC 206 (H) 02/26/2019   LDLDIRECT 103.0 02/18/2018   TRIG 149.0 02/26/2019   CHOLHDL 7 02/26/2019   Lab Results  Component Value Date   HGBA1C 7 04/06/2019   Lab Results  Component Value Date   VITAMINB12 >1500 (H) 03/05/2011   Lab Results  Component Value Date   TSH 2.12 01/28/2019       ASSESSMENT AND PLAN 86 y.o. year old female  has a past medical history of Anemia, Diabetes mellitus, type II ([place]), Glaucoma, History of colon cancer (1998), Hyperlipemia, Osteopenia (02/2012, 03/2014), Renal insufficiency, Seizures ([place]), and Stenosing tenosynovitis of finger of left hand (2016). here with     ICD-10-CM   1. Seizures ([place])  R56.9     Arlee is doing very well on levetiracetam 250mg  twice daily. We will continue current treatment plan. She is living alone and independent. MMSE is 27/30 today. She was encouraged to use caution with driving. I recommend local, daytime driving for now. I have advised that she monitor BP closely. It is elevated today but patient reports white coat syndrome. She checks regularly at home and readings are 130-140/70-80.  She will stay well hydrated. Well balanced, low sodium diet advised. Continue use of medical alert necklace as indicated. Fall precautions. Follow up with me in 1 year, sooner if needed.    No orders of the defined types were placed in this encounter.    No orders of the defined types were placed in this encounter.     I spent 30 minutes with the patient. 50% of this time was spent counseling and educating patient on plan of care and medications.    Debbora Presto, FNP-C 09/09/2019, 9:27 AM [facility] [address], [place], [place] [address] [phone]

## 2019-09-08 NOTE — Patient Instructions (Signed)
Continue levetiracetam 250mg  twice daily  Caution with driving, local, daytime driving recommended  Follow up in 1 year, sooner if needed   Seizure, Adult A seizure is a sudden burst of abnormal electrical activity in the brain. Seizures usually last from 30 seconds to 2 minutes. They can cause many different symptoms. Usually, seizures are not harmful unless they last a long time. What are the causes? Common causes of this condition include:  Fever or infection.  Conditions that affect the brain, such as: ? A brain abnormality that you were born with. ? A brain or head injury. ? Bleeding in the brain. ? A tumor. ? Stroke. ? Brain disorders such as autism or cerebral palsy.  Low blood sugar.  Conditions that are passed from parent to child (are inherited).  Problems with substances, such as: ? Having a reaction to a drug or a medicine. ? Suddenly stopping the use of a substance (withdrawal). In some cases, the cause may not be known. A person who has repeated seizures over time without a clear cause has a condition called epilepsy. What increases the risk? You are more likely to get this condition if you have:  A family history of epilepsy.  Had a seizure in the past.  A brain disorder.  A history of head injury, lack of oxygen at birth, or strokes. What are the signs or symptoms? There are many types of seizures. The symptoms vary depending on the type of seizure you have. Examples of symptoms during a seizure include:  Shaking (convulsions).  Stiffness in the body.  Passing out (losing consciousness).  Head nodding.  Staring.  Not responding to sound or touch.  Loss of bladder control and bowel control. Some people have symptoms right before and right after a seizure happens. Symptoms before a seizure may include:  Fear.  Worry (anxiety).  Feeling like you may vomit (nauseous).  Feeling like the room is spinning (vertigo).  Feeling like you saw  or heard something before (dj vu).  Odd tastes or smells.  Changes in how you see. You may see flashing lights or spots. Symptoms after a seizure happens can include:  Confusion.  Sleepiness.  Headache.  Weakness on one side of the body. How is this treated? Most seizures will stop on their own in under 5 minutes. In these cases, no treatment is needed. Seizures that last longer than 5 minutes will usually need treatment. Treatment can include:  Medicines given through an IV tube.  Avoiding things that are known to cause your seizures. These can include medicines that you take for another condition.  Medicines to treat epilepsy.  Surgery to stop the seizures. This may be needed if medicines do not help. Follow these instructions at home: Medicines  Take over-the-counter and prescription medicines only as told by your doctor.  Do not eat or drink anything that may keep your medicine from working, such as alcohol. Activity  Do not do any activities that would be dangerous if you had another seizure, like driving or swimming. Wait until your doctor says it is safe for you to do them.  If you live in the [place] ask your local DMV (department of motor vehicles) when you can drive.  Get plenty of rest. Teaching others Teach friends and family what to do when you have a seizure. They should:  Lay you on the ground.  Protect your head and body.  Loosen any tight clothing around your neck.  Turn you on  your side.  Not hold you down.  Not put anything into your mouth.  Know whether or not you need emergency care.  Stay with you until you are better.  General instructions  Contact your doctor each time you have a seizure.  Avoid anything that gives you seizures.  Keep a seizure diary. Write down: ? What you think caused each seizure. ? What you remember about each seizure.  Keep all follow-up visits as told by your doctor. This is important. Contact a doctor  if:  You have another seizure.  You have seizures more often.  There is any change in what happens during your seizures.  You keep having seizures with treatment.  You have symptoms of being sick or having an infection. Get help right away if:  You have a seizure that: ? Lasts longer than 5 minutes. ? Is different than seizures you had before. ? Makes it harder to breathe. ? Happens after you hurt your head.  You have any of these symptoms after a seizure: ? Not being able to speak. ? Not being able to use a part of your body. ? Confusion. ? A bad headache.  You have two or more seizures in a row.  You do not wake up right after a seizure.  You get hurt during a seizure. These symptoms may be an emergency. Do not wait to see if the symptoms will go away. Get medical help right away. Call your local emergency services (911 in the [place] Do not drive yourself to the hospital. Summary  Seizures usually last from 30 seconds to 2 minutes. Usually, they are not harmful unless they last a long time.  Do not eat or drink anything that may keep your medicine from working, such as alcohol.  Teach friends and family what to do when you have a seizure.  Contact your doctor each time you have a seizure. This information is not intended to replace advice given to you by your health care provider. Make sure you discuss any questions you have with your health care provider. Document Revised: 08/08/2018 Document Reviewed: 08/08/2018 Elsevier Patient Education  [employer].   Memory Compensation Strategies  1. Use "WARM" strategy.  W= write it down  A= associate it  R= repeat it  M= make a mental note  2.   You can keep a Social worker.  Use a 3-ring notebook with sections for the following: calendar, important names and phone numbers,  medications, doctors' names/phone numbers, lists/reminders, and a section to journal what you did  each day.   3.    Use a calendar to  write appointments down.  4.    Write yourself a schedule for the day.  This can be placed on the calendar or in a separate section of the Memory Notebook.  Keeping a  regular schedule can help memory.  5.    Use medication organizer with sections for each day or morning/evening pills.  You may need help loading it  6.    Keep a basket, or pegboard by the door.  Place items that you need to take out with you in the basket or on the pegboard.  You may also want to  include a message board for reminders.  7.    Use sticky notes.  Place sticky notes with reminders in a place where the task is performed.  For example: " turn off the  stove" placed by the stove, "lock the door" placed  on the door at eye level, " take your medications" on  the bathroom mirror or by the place where you normally take your medications.  8.    Use alarms/timers.  Use while cooking to remind yourself to check on food or as a reminder to take your medicine, or as a  reminder to make a call, or as a reminder to perform another task, etc.

## 2019-09-09 ENCOUNTER — Encounter: Payer: Self-pay | Admitting: Family Medicine

## 2019-09-23 ENCOUNTER — Ambulatory Visit: Payer: Medicare Other | Attending: Internal Medicine

## 2019-09-23 DIAGNOSIS — Z23 Encounter for immunization: Secondary | ICD-10-CM

## 2019-09-23 NOTE — Progress Notes (Signed)
   Covid-19 Vaccination [facility]  Name:  Taylor Castaneda    MRN: [id number] DOB: 1932/10/31  09/23/2019  Ms. Taylor Castaneda was observed post Covid-19 immunization for 15 minutes without incident. She was provided with Vaccine Information Sheet and instruction to access the V-Safe system.   Ms. Taylor Castaneda was instructed to call 911 with any severe reactions post vaccine: Marland Kitchen Difficulty breathing  . Swelling of face and throat  . A fast heartbeat  . A bad rash all over body  . Dizziness and weakness   Immunizations Administered    Name Date Dose VIS Date Route   Pfizer COVID-19 Vaccine 09/23/2019  9:30 AM 0.3 mL 07/29/2018 Intramuscular   Manufacturer: [place]   Lot: [id number]   Shirley: [id number]

## 2019-09-24 NOTE — Progress Notes (Signed)
I reviewed note and agree with plan.   Shandelle Borrelli R. Reese Senk, MD 09/24/2019, 2:23 PM Certified in Neurology, Neurophysiology and Neuroimaging  [facility] [address], [address] [place], [place] [address] [phone]  

## 2019-10-12 DIAGNOSIS — E1165 Type 2 diabetes mellitus with hyperglycemia: Secondary | ICD-10-CM | POA: Diagnosis not present

## 2019-10-12 DIAGNOSIS — G609 Hereditary and idiopathic neuropathy, unspecified: Secondary | ICD-10-CM | POA: Diagnosis not present

## 2019-10-12 DIAGNOSIS — I10 Essential (primary) hypertension: Secondary | ICD-10-CM | POA: Diagnosis not present

## 2019-10-12 DIAGNOSIS — R809 Proteinuria, unspecified: Secondary | ICD-10-CM | POA: Diagnosis not present

## 2019-10-29 ENCOUNTER — Telehealth: Payer: Self-pay | Admitting: Family Medicine

## 2019-10-29 NOTE — Progress Notes (Signed)
°  Chronic Care Management   Note  10/29/2019 Name: Taylor Castaneda MRN: [id number] DOB: Nov 25, 1932  Taylor Castaneda is a 86 y.o. year old female who is a primary care patient of [name], MD. I reached out to [name] by phone today in response to a referral sent by Taylor Castaneda's PCP, [name], MD.   Taylor Castaneda was given information about Chronic Care Management services today including:  1. CCM service includes personalized support from designated clinical staff supervised by her physician, including individualized plan of care and coordination with other care providers 2. 24/7 contact phone numbers for assistance for urgent and routine care needs. 3. Service will only be billed when office clinical staff spend 20 minutes or more in a month to coordinate care. 4. Only one practitioner may furnish and bill the service in a calendar month. 5. The patient may stop CCM services at any time (effective at the end of the month) by phone call to the office staff.   Patient agreed to services and verbal consent obtained.   This note is not being shared with the patient for the following reason: To respect privacy (The patient or proxy has requested that the information not be shared).  Follow up plan:   Earney Hamburg [employer] Scheduler

## 2019-11-03 DIAGNOSIS — H353132 Nonexudative age-related macular degeneration, bilateral, intermediate dry stage: Secondary | ICD-10-CM | POA: Diagnosis not present

## 2019-11-03 DIAGNOSIS — E119 Type 2 diabetes mellitus without complications: Secondary | ICD-10-CM | POA: Diagnosis not present

## 2019-11-03 DIAGNOSIS — H35033 Hypertensive retinopathy, bilateral: Secondary | ICD-10-CM | POA: Diagnosis not present

## 2019-11-03 DIAGNOSIS — H402232 Chronic angle-closure glaucoma, bilateral, moderate stage: Secondary | ICD-10-CM | POA: Diagnosis not present

## 2019-11-15 ENCOUNTER — Other Ambulatory Visit: Payer: Self-pay | Admitting: Family Medicine

## 2020-01-04 ENCOUNTER — Telehealth: Payer: Self-pay | Admitting: Family Medicine

## 2020-01-04 NOTE — Progress Notes (Signed)
  Chronic Care Management   Note  01/04/2020 Name: Taylor Castaneda MRN: [id number] DOB: September 05, 1932  Taylor Castaneda is a 86 y.o. year old female who is a primary care patient of [name], MD. I reached out to [name] by phone today in response to a referral sent by [name]'s PCP, [name], MD.   [name] was given information about Chronic Care Management services today including:  1. CCM service includes personalized support from designated clinical staff supervised by her physician, including individualized plan of care and coordination with other care providers 2. 24/7 contact phone numbers for assistance for urgent and routine care needs. 3. Service will only be billed when office clinical staff spend 20 minutes or more in a month to coordinate care. 4. Only one practitioner may furnish and bill the service in a calendar month. 5. The patient may stop CCM services at any time (effective at the end of the month) by phone call to the office staff.   Patient agreed to services and verbal consent obtained.   Follow up plan:   Carley Perdue [employer] Scheduler

## 2020-01-05 ENCOUNTER — Telehealth: Payer: Self-pay | Admitting: Family Medicine

## 2020-01-05 NOTE — Progress Notes (Signed)
  Chronic Care Management   Note  01/05/2020 Name: KAIRI HARSHBARGER MRN: [id number] DOB: 08-14-1932  ALECE KOPPEL is a 86 y.o. year old female who is a primary care patient of [name], MD. I reached out to [name] by phone today in response to a referral sent by Ms. [name] Gumina's PCP, [name], MD.   Ms. Carrasco was given information about Chronic Care Management services today including:  1. CCM service includes personalized support from designated clinical staff supervised by her physician, including individualized plan of care and coordination with other care providers 2. 24/7 contact phone numbers for assistance for urgent and routine care needs. 3. Service will only be billed when office clinical staff spend 20 minutes or more in a month to coordinate care. 4. Only one practitioner may furnish and bill the service in a calendar month. 5. The patient may stop CCM services at any time (effective at the end of the month) by phone call to the office staff.   Patient agreed to services and verbal consent obtained.   Follow up plan:   Carley Perdue [employer] Scheduler

## 2020-01-07 ENCOUNTER — Telehealth: Payer: Medicare Other

## 2020-01-21 ENCOUNTER — Telehealth: Payer: Medicare Other

## 2020-02-11 ENCOUNTER — Other Ambulatory Visit: Payer: Self-pay | Admitting: Family Medicine

## 2020-02-11 DIAGNOSIS — E1122 Type 2 diabetes mellitus with diabetic chronic kidney disease: Secondary | ICD-10-CM

## 2020-02-11 DIAGNOSIS — E1121 Type 2 diabetes mellitus with diabetic nephropathy: Secondary | ICD-10-CM

## 2020-02-11 DIAGNOSIS — M109 Gout, unspecified: Secondary | ICD-10-CM

## 2020-02-11 DIAGNOSIS — E782 Mixed hyperlipidemia: Secondary | ICD-10-CM

## 2020-02-12 ENCOUNTER — Other Ambulatory Visit (INDEPENDENT_AMBULATORY_CARE_PROVIDER_SITE_OTHER): Payer: Medicare Other

## 2020-02-12 ENCOUNTER — Other Ambulatory Visit: Payer: Self-pay

## 2020-02-12 DIAGNOSIS — E1121 Type 2 diabetes mellitus with diabetic nephropathy: Secondary | ICD-10-CM

## 2020-02-12 DIAGNOSIS — E1122 Type 2 diabetes mellitus with diabetic chronic kidney disease: Secondary | ICD-10-CM | POA: Diagnosis not present

## 2020-02-12 DIAGNOSIS — Z794 Long term (current) use of insulin: Secondary | ICD-10-CM | POA: Diagnosis not present

## 2020-02-12 DIAGNOSIS — N183 Chronic kidney disease, stage 3 unspecified: Secondary | ICD-10-CM | POA: Diagnosis not present

## 2020-02-12 DIAGNOSIS — E782 Mixed hyperlipidemia: Secondary | ICD-10-CM | POA: Diagnosis not present

## 2020-02-12 DIAGNOSIS — M109 Gout, unspecified: Secondary | ICD-10-CM | POA: Diagnosis not present

## 2020-02-12 LAB — CBC WITH DIFFERENTIAL/PLATELET
Basophils Absolute: 0.1 10*3/uL (ref 0.0–0.1)
Basophils Relative: 0.9 % (ref 0.0–3.0)
Eosinophils Absolute: 0.3 10*3/uL (ref 0.0–0.7)
Eosinophils Relative: 3.2 % (ref 0.0–5.0)
HCT: 36.3 % (ref 36.0–46.0)
Hemoglobin: 12.4 g/dL (ref 12.0–15.0)
Lymphocytes Relative: 27.3 % (ref 12.0–46.0)
Lymphs Abs: 2.4 10*3/uL (ref 0.7–4.0)
MCHC: 34.1 g/dL (ref 30.0–36.0)
MCV: 94.9 fl (ref 78.0–100.0)
Monocytes Absolute: 1 10*3/uL (ref 0.1–1.0)
Monocytes Relative: 12 % (ref 3.0–12.0)
Neutro Abs: 4.9 10*3/uL (ref 1.4–7.7)
Neutrophils Relative %: 56.6 % (ref 43.0–77.0)
Platelets: 158 10*3/uL (ref 150.0–400.0)
RBC: 3.83 Mil/uL — ABNORMAL LOW (ref 3.87–5.11)
RDW: 12.8 % (ref 11.5–15.5)
WBC: 8.7 10*3/uL (ref 4.0–10.5)

## 2020-02-12 LAB — COMPREHENSIVE METABOLIC PANEL
ALT: 24 U/L (ref 0–35)
AST: 21 U/L (ref 0–37)
Albumin: 3.9 g/dL (ref 3.5–5.2)
Alkaline Phosphatase: 134 U/L — ABNORMAL HIGH (ref 39–117)
BUN: 47 mg/dL — ABNORMAL HIGH (ref 6–23)
CO2: 28 mEq/L (ref 19–32)
Calcium: 9.5 mg/dL (ref 8.4–10.5)
Chloride: 108 mEq/L (ref 96–112)
Creatinine, Ser: 1.41 mg/dL — ABNORMAL HIGH (ref 0.40–1.20)
GFR: 35.39 mL/min — ABNORMAL LOW (ref >60.00)
Glucose, Bld: 158 mg/dL — ABNORMAL HIGH (ref 70–99)
Potassium: 4.5 mEq/L (ref 3.5–5.1)
Sodium: 144 mEq/L (ref 135–145)
Total Bilirubin: 0.6 mg/dL (ref 0.2–1.2)
Total Protein: 6.7 g/dL (ref 6.0–8.3)

## 2020-02-12 LAB — LIPID PANEL
Cholesterol: 118 mg/dL (ref 0–200)
HDL: 32.3 mg/dL — ABNORMAL LOW (ref >39.00)
LDL Cholesterol: 60 mg/dL (ref 0–99)
NonHDL: 85.83
Total CHOL/HDL Ratio: 4
Triglycerides: 127 mg/dL (ref 0.0–149.0)
VLDL: 25.4 mg/dL (ref 0.0–40.0)

## 2020-02-12 LAB — VITAMIN D 25 HYDROXY (VIT D DEFICIENCY, FRACTURES): VITD: 41.03 ng/mL (ref 30.00–100.00)

## 2020-02-12 LAB — HEMOGLOBIN A1C: Hgb A1c MFr Bld: 8.7 % — ABNORMAL HIGH (ref 4.6–6.5)

## 2020-02-12 LAB — URIC ACID: Uric Acid, Serum: 9.5 mg/dL — ABNORMAL HIGH (ref 2.4–7.0)

## 2020-02-14 ENCOUNTER — Other Ambulatory Visit: Payer: Self-pay | Admitting: Family Medicine

## 2020-02-15 LAB — PARATHYROID HORMONE, INTACT (NO CA): PTH: 87 pg/mL — ABNORMAL HIGH (ref 14–64)

## 2020-02-16 NOTE — Chronic Care Management (AMB) (Signed)
Chronic Care Management Pharmacy  Name: FLORAINE BUECHLER  MRN: [id number] DOB: 18-Feb-1933  Chief Complaint/ HPI  Taylor Castaneda,  86 y.o. , female presents for their Initial CCM visit with the clinical pharmacist via telephone due to COVID-19 Pandemic.  PCP : [name], MD  Their chronic conditions include: hypertension, PAD, atrial fibrillation, type 2 diabetes, osteopenia, hyperlipidemia, gout, seizure.   Office Visits: 09/23/2019 - COVID vaccine.  08/31/2019 - COVID vaccine.  08/19/2019 - significant improvement with regular meals and weight gain. Continue glucerna daily. Recommend COVID vaccine.  Consult Visit: 09/08/2019 - neuro - Luana is doing very well on levetiracetam 231m twice daily. We will continue current treatment plan. Medications: Outpatient Encounter Medications as of 02/19/2020  Medication Sig  . B-D ULTRAFINE III SHORT PEN 31G X 8 MM MISC USE AS DIRECTED WITH INSULIN  . Biotin 10000 MCG TABS Take 1 tablet by mouth daily.  . Carboxymethylcellul-Glycerin (CLEAR EYES FOR DRY EYES) 1-0.25 % SOLN Place 1-2 drops into both eyes 3 (three) times daily as needed (for dry/irritated eyes.).  .Marland KitchenCholecalciferol (VITAMIN D3) 25 MCG (1000 UT) CAPS Take 1 capsule (1,000 Units total) by mouth daily.  . Cinnamon 500 MG capsule Take 500 mg by mouth daily.  .Marland KitchenHUMALOG KWIKPEN 100 UNIT/ML KiwkPen Inject 1-10 Units into the skin 3 (three) times daily before meals. Sliding scale  . LANTUS SOLOSTAR 100 UNIT/ML Solostar Pen Inject 10-16 Units into the skin at bedtime.   . levETIRAcetam (KEPPRA) 250 MG tablet Take 1 tablet (250 mg total) by mouth 2 (two) times daily.  . Multiple Vitamins-Minerals (PRESERVISION AREDS 2 PO) Take by mouth 2 (two) times daily.  . potassium chloride (K-DUR) 10 MEQ tablet TAKE 1  BY MOUTH ON MONDAY, [facility] (Patient taking differently: Take 10 mEq by mouth every Monday, Wednesday, and Friday. )  . [DISCONTINUED] atorvastatin (LIPITOR) 40 MG  tablet Take 1 tablet (40 mg total) by mouth daily at 6 PM.  . [DISCONTINUED] colchicine 0.6 MG tablet Take 1 tablet (0.6 mg total) by mouth daily as needed (gout flare). First day of gout flare, may take 1 tablet twice daily.  . [DISCONTINUED] ramipril (ALTACE) 5 MG capsule Take 1 capsule by mouth once daily  . aspirin 81 MG EC tablet Take 1 tablet (81 mg total) by mouth daily. Swallow whole.  . Ferrous Sulfate (IRON) 325 (65 Fe) MG TABS Take 1 tablet by mouth 2 (two) times a week. Takes Mon and TSmithfield Foods . [DISCONTINUED] carvedilol (COREG) 12.5 MG tablet Take 1 tablet (12.5 mg total) by mouth 2 (two) times daily.  . [DISCONTINUED] furosemide (LASIX) 20 MG tablet Take 1 tablet (20 mg total) by mouth daily.   No facility-administered encounter medications on file as of 02/19/2020.   Allergies  Allergen Reactions  . Pravastatin Sodium     REACTION: anemia [facility]  SDOH Screenings   Alcohol Screen:   . Last Alcohol Screening Score (AUDIT): Not on file  Depression (PHQ2-9): Medium Risk  . PHQ-2 Score: 8  Financial Resource Strain: Low Risk   . Difficulty of Paying Living Expenses: Not hard at all  Food Insecurity: No Food Insecurity  . Worried About RCharity fundraiserin the Last Year: Never true  . Ran Out of Food in the Last Year: Never true  Housing:   . Last Housing Risk Score: Not on file  Physical Activity: Sufficiently Active  . Days of Exercise per Week:  3 days  . Minutes of Exercise per Session: 60 min  Social Connections:   . Frequency of Communication with Friends and Family: Not on file  . Frequency of Social Gatherings with Friends and Family: Not on file  . Attends Religious Services: Not on file  . Active Member of Clubs or Organizations: Not on file  . Attends Archivist Meetings: Not on file  . Marital Status: Not on file  Stress: No Stress Concern Present  . Feeling of Stress : Not at all  Tobacco Use: Medium Risk  . Smoking Tobacco  Use: Former Smoker  . Smokeless Tobacco Use: Never Used  Transportation Needs: No Transportation Needs  . Lack of Transportation (Medical): No  . Lack of Transportation (Non-Medical): No   Current Diagnosis/Assessment:  Goals Addressed            This Visit's Progress   . Pharmacy Care Plan       CARE PLAN ENTRY (see longitudinal plan of care for additional care plan information)  Current Barriers:  . Chronic Disease Management support, education, and care coordination needs related to Hypertension, Hyperlipidemia, and Diabetes   Hypertension BP Readings from Last 3 Encounters:  09/08/19 (!) 171/65  08/19/19 140/62  06/02/19 104/60   . Pharmacist Clinical Goal(s): o Over the next 90 days, patient will work with PharmD and providers to achieve BP goal <140/90 . Current regimen:   Carvedilol 12.5 mg bid  Furosemide 20 mg daily  Ramipril 5 mg daily . Interventions: o Reviewed medication list.  o Patient reports lower blood pressure readings at home. Provider encouraged her to check her blood pressure cuff against manual cuff or bring in office to compare.  o Patient encouraged to continue exercise regimen.  . Patient self care activities - Over the next 90 days, patient will: o Check BP daily, document, and provide at future appointments o Ensure daily salt intake < 2300 mg/day  Hyperlipidemia Lab Results  Component Value Date/Time   LDLCALC 60 02/12/2020 07:59 AM   LDLDIRECT 103.0 02/18/2018 09:36 AM   . Pharmacist Clinical Goal(s): o Over the next 90 days, patient will work with PharmD and providers to maintain LDL goal < 70  . Current regimen:  . Atorvastatin 40 mg daily at 6 pm . Aspirin 81 EC mg  . Interventions: o Reviewed medication list.  o Patient reports taking as directed and denies adverse effects.  . Patient self care activities - Over the next 90 days, patient will: o Continue to take medication as prescribed.   Diabetes Lab Results  Component  Value Date/Time   HGBA1C 8.7 (H) 02/12/2020 07:59 AM   HGBA1C 7 04/06/2019 12:00 AM   HGBA1C 7.4 (H) 02/26/2019 08:55 AM   . Pharmacist Clinical Goal(s): o Over the next 90 days, patient will work with PharmD and providers to achieve A1c goal <8% . Current regimen:   Humalog kwikpen 1-10 units tid (sliding scale)  Lantus Solostar 16 units into the skin at bedtime  Cinnamon 500 mg daily  BD Ultrafine Pen needle . Interventions: o Recommend patient provide blood sugar log at upcoming Endocrinology appointment.  . Patient self care activities - Over the next 90 days, patient will: o Check blood sugar once daily, document, and provide at future appointments o Contact provider with any episodes of hypoglycemia Medication management . Pharmacist Clinical Goal(s): o Over the next 90 days, patient will work with PharmD and providers to maintain optimal medication adherence . Current pharmacy:  [facility] . Interventions o Comprehensive medication review performed. o Continue current medication management strategy . Patient self care activities - Over the next 90 days, patient will: o Focus on medication adherence by continuing to use pill box.  o Take medications as prescribed o Report any questions or concerns to PharmD and/or provider(s)  Initial goal documentation        Diabetes   Recent Relevant Labs: Lab Results  Component Value Date/Time   HGBA1C 8.7 (H) 02/12/2020 07:59 AM   HGBA1C 7 04/06/2019 12:00 AM   HGBA1C 7.4 (H) 02/26/2019 08:55 AM   MICROALBUR 2.2 (H) 08/20/2017 10:22 AM   MICROALBUR 7.3 (H) 03/10/2014 09:12 AM     Checking BG: 3x per Day  Recent FBG Readings: 131-220  Patient has failed these meds in past: tradjenta Patient is currently uncontrolled on the following medications:   Humalog kwikpen 1-10 units tid (sliding scale)  Lantus Solostar 10-16 units into the skin at bedtime  Cinnamon 500 mg daily  BD Ultrafine Pen needle  Last  diabetic Foot exam:  Lab Results  Component Value Date/Time   HMDIABEYEEXA No Retinopathy 02/01/2017 12:00 AM    Last diabetic Eye exam:  Lab Results  Component Value Date/Time   HMDIABFOOTEX with endo  07/21/2014 12:00 AM     We discussed: diet and exercise extensively  Diet: her oldest son comes to eat lunch with her every day.She doesn't cook a lot. Patient eats salads or sandwiches because doesn't cook for just 1 person. Patient is drinking an Atkins shake for breakfast but seems to run her blood sugar up. Patient doesn't have much appetite and little taste. Eats peanut butter crackers at night to help stabilize blood sugar overnight. Patient reports eating ice cream during the day.   Exercise: Patient takes 2 walks each day. Patient uses cane during outside walks.   Affordability -  Patient reports insulin is a little expensive. Pharmacist encouraged patient to have her daughter reach out to discuss patient assistance options. Patient reports daughter picks up all medications and fills pill box.   Plan  Continue current medications and  Hypertension   BP today is:  <140/90  Office blood pressures are  BP Readings from Last 3 Encounters:  02/19/20 140/66  09/08/19 (!) 171/65  08/19/19 140/62    Patient has failed these meds in the past: amlodipine,  Patient is currently controlled on the following medications:   Carvedilol 12.5 mg bid  Furosemide 20 mg daily  Ramipril 5 mg daily Patient checks BP at home daily  Patient home BP readings are ranging: 95/64, 103/56,  pulse 72, 75  We discussed diet and exercise extensively. Patient uses a cane outside to avoid falls. Patient denies swelling. Patient will take blood pressure readings with her to appointment this afternoon.   Plan  Continue current medications     Hyperlipidemia   LDL goal < 70  Lipid Panel     Component Value Date/Time   CHOL 118 02/12/2020 0759   TRIG 127.0 02/12/2020 0759   HDL 32.30 (L)  02/12/2020 0759   LDLCALC 60 02/12/2020 0759   LDLDIRECT 103.0 02/18/2018 0936    Hepatic Function Latest Ref Rng & Units 02/12/2020 03/06/2019 03/05/2019  Total Protein 6.0 - 8.3 g/dL 6.7 6.0(L) 6.9  Albumin 3.5 - 5.2 g/dL 3.9 3.1(L) 3.8  AST 0 - 37 U/L '21 19 21  ' ALT 0 - 35 U/L '24 17 19  ' Alk Phosphatase 39 - 117 U/L 134(H) 91  111  Total Bilirubin 0.2 - 1.2 mg/dL 0.6 0.8 1.3(H)  Bilirubin, Direct 0.0 - 0.3 mg/dL - - -     The ASCVD Risk score Mikey Bussing DC Jr., et al., 2013) failed to calculate for the following reasons:   The 2013 ASCVD risk score is only valid for ages 19 to 73   Patient has failed these meds in past: n/a Patient is currently controlled on the following medications:  . Atorvastatin 40 mg daily at 6 pm . Aspirin 81 EC mg   We discussed:  diet and exercise extensively  Plan  Continue current medications  GOUT   Patient has failed these meds in past: n/a Patient is currently controlled on the following medications:  . Colchicine 0.6 mg daily prn for gout flare (bid on first day)  We discussed:  Denies any issues with gout.   Plan  Continue current medications   Seizure   Patient has failed these meds in past: n/a Patient is currently controlled on the following medications:  . Levetiracetam 250 mg bid  We discussed:  Patient denies seizures. Stable on medication dose.   Plan  Continue current medications   Osteopenia / Osteoporosis   Last DEXA Scan: 04/07/2014   T-Score total hip: -2.2   VITD  Date Value Ref Range Status  02/12/2020 41.03 30.00 - 100.00 ng/mL Final     Patient has failed these meds in past: alendronate Patient is currently controlled on the following medications:  Marland Kitchen Vitamin D3 1000 unit capsule  We discussed:  Recommend [phone] units of vitamin D daily. Recommend 1200 mg of calcium daily from dietary and supplemental sources. Recommend weight-bearing and muscle strengthening exercises for building and maintaining bone  density.  Plan  Continue current medications   Health Maintenance   Patient is currently controlled on the following medications:  . Ferrous sulfate 325 mg twice weekly - patient doesn't think she is taking currently . Biotin 10,000 mcg daily - supplementation . Clear Eyes for dry eyes 1-2 drops into both eyes tid prn dry/irritated eyes . Preservision AREDS 2 bid - supplementation . Potassium Chloride 10 meq daily M/W/F - supplementation  We discussed:  Patient reports medications are going well. She reports 8-10 hours of sleep most nights but still sleepy during the day. She plans to discuss at her appointment this afternoon.   Plan  Continue current medications  Vaccines   Reviewed and discussed patient's vaccination history.    Immunization History  Administered Date(s) Administered  . Fluad Quad(high Dose 65+) 03/03/2019, 02/19/2020  . Influenza Split 03/16/2011, 03/12/2012  . Influenza Whole 06/04/2002, 04/24/2007, 03/10/2008, 03/31/2009, 04/18/2010  . Influenza, High Dose Seasonal PF 03/18/2013  . Influenza,inj,Quad PF,6+ Mos 03/10/2014, 01/30/2016, 02/07/2017, 02/18/2018  . PFIZER SARS-COV-2 Vaccination 08/31/2019, 09/23/2019  . Pneumococcal Conjugate-13 03/10/2014  . Pneumococcal Polysaccharide-23 03/23/2011  . Td 06/08/1996  . Zoster 06/05/2007    Plan  Recommended patient receive annual flu vaccine in office.   Medication Management   Pt uses [facility] for all medications Uses pill box? Yes Pt endorses 100% compliance  We discussed: Daughter manages refills and picks up medications at [facility]. Daughter prepares medication in a pill box for her. She denies missed doses. [facility] is a preferred pharmacy for her medicare plan.   Plan  Continue current medication management strategy    Follow up: 6 month phone visit

## 2020-02-19 ENCOUNTER — Encounter: Payer: Self-pay | Admitting: Family Medicine

## 2020-02-19 ENCOUNTER — Other Ambulatory Visit: Payer: Self-pay

## 2020-02-19 ENCOUNTER — Ambulatory Visit: Payer: Medicare Other

## 2020-02-19 ENCOUNTER — Ambulatory Visit (INDEPENDENT_AMBULATORY_CARE_PROVIDER_SITE_OTHER): Payer: Medicare Other | Admitting: Family Medicine

## 2020-02-19 VITALS — BP 140/66 | HR 70 | Temp 97.6°F | Ht 59.0 in | Wt 114.2 lb

## 2020-02-19 DIAGNOSIS — I4891 Unspecified atrial fibrillation: Secondary | ICD-10-CM

## 2020-02-19 DIAGNOSIS — E1122 Type 2 diabetes mellitus with diabetic chronic kidney disease: Secondary | ICD-10-CM

## 2020-02-19 DIAGNOSIS — M8580 Other specified disorders of bone density and structure, unspecified site: Secondary | ICD-10-CM

## 2020-02-19 DIAGNOSIS — E785 Hyperlipidemia, unspecified: Secondary | ICD-10-CM

## 2020-02-19 DIAGNOSIS — Z0000 Encounter for general adult medical examination without abnormal findings: Secondary | ICD-10-CM | POA: Diagnosis not present

## 2020-02-19 DIAGNOSIS — E1169 Type 2 diabetes mellitus with other specified complication: Secondary | ICD-10-CM

## 2020-02-19 DIAGNOSIS — N183 Chronic kidney disease, stage 3 unspecified: Secondary | ICD-10-CM

## 2020-02-19 DIAGNOSIS — I10 Essential (primary) hypertension: Secondary | ICD-10-CM

## 2020-02-19 DIAGNOSIS — Z794 Long term (current) use of insulin: Secondary | ICD-10-CM

## 2020-02-19 DIAGNOSIS — Z8739 Personal history of other diseases of the musculoskeletal system and connective tissue: Secondary | ICD-10-CM

## 2020-02-19 DIAGNOSIS — G3184 Mild cognitive impairment, so stated: Secondary | ICD-10-CM

## 2020-02-19 DIAGNOSIS — E1121 Type 2 diabetes mellitus with diabetic nephropathy: Secondary | ICD-10-CM

## 2020-02-19 DIAGNOSIS — N2581 Secondary hyperparathyroidism of renal origin: Secondary | ICD-10-CM

## 2020-02-19 DIAGNOSIS — I739 Peripheral vascular disease, unspecified: Secondary | ICD-10-CM

## 2020-02-19 DIAGNOSIS — Z23 Encounter for immunization: Secondary | ICD-10-CM

## 2020-02-19 DIAGNOSIS — R4584 Anhedonia: Secondary | ICD-10-CM

## 2020-02-19 DIAGNOSIS — R569 Unspecified convulsions: Secondary | ICD-10-CM

## 2020-02-19 MED ORDER — FUROSEMIDE 20 MG PO TABS: 20.0000 mg | ORAL_TABLET | Freq: Every day | ORAL | 3 refills | Status: DC

## 2020-02-19 MED ORDER — COLCHICINE 0.6 MG PO TABS: 0.6000 mg | ORAL_TABLET | Freq: Every day | ORAL | 3 refills | Status: DC | PRN

## 2020-02-19 MED ORDER — ATORVASTATIN CALCIUM 40 MG PO TABS: 40.0000 mg | ORAL_TABLET | Freq: Every day | ORAL | 3 refills | Status: DC

## 2020-02-19 MED ORDER — CARVEDILOL 12.5 MG PO TABS: 12.5000 mg | ORAL_TABLET | Freq: Two times a day (BID) | ORAL | 3 refills | Status: DC

## 2020-02-19 MED ORDER — RAMIPRIL 5 MG PO CAPS
5.0000 mg | ORAL_CAPSULE | Freq: Every day | ORAL | 3 refills | Status: DC
Start: 2020-02-19 — End: 2020-05-13

## 2020-02-19 NOTE — Patient Instructions (Addendum)
Flu shot today  Bring home BP cuff to next visit to compare. Or check at home manually an ensure accurate readings.  If interested, check with pharmacy about new 2 shot shingles series (shingrix).  Contact neurology for keppra refill.  You are doing well today  Return as needed or in 6 months for blood pressure follow up visit.   Health Maintenance After Age 84 After age 20, you are at a higher risk for certain long-term diseases and infections as well as injuries from falls. Falls are a major cause of broken bones and head injuries in people who are older than age 56. Getting regular preventive care can help to keep you healthy and well. Preventive care includes getting regular testing and making lifestyle changes as recommended by your health care provider. Talk with your health care provider about:  Which screenings and tests you should have. A screening is a test that checks for a disease when you have no symptoms.  A diet and exercise plan that is right for you. What should I know about screenings and tests to prevent falls? Screening and testing are the best ways to find a health problem early. Early diagnosis and treatment give you the best chance of managing medical conditions that are common after age 33. Certain conditions and lifestyle choices may make you more likely to have a fall. Your health care provider may recommend:  Regular vision checks. Poor vision and conditions such as cataracts can make you more likely to have a fall. If you wear glasses, make sure to get your prescription updated if your vision changes.  Medicine review. Work with your health care provider to regularly review all of the medicines you are taking, including over-the-counter medicines. Ask your health care provider about any side effects that may make you more likely to have a fall. Tell your health care provider if any medicines that you take make you feel dizzy or sleepy.  Osteoporosis screening.  Osteoporosis is a condition that causes the bones to get weaker. This can make the bones weak and cause them to break more easily.  Blood pressure screening. Blood pressure changes and medicines to control blood pressure can make you feel dizzy.  Strength and balance checks. Your health care provider may recommend certain tests to check your strength and balance while standing, walking, or changing positions.  Foot health exam. Foot pain and numbness, as well as not wearing proper footwear, can make you more likely to have a fall.  Depression screening. You may be more likely to have a fall if you have a fear of falling, feel emotionally low, or feel unable to do activities that you used to do.  Alcohol use screening. Using too much alcohol can affect your balance and may make you more likely to have a fall. What actions can I take to lower my risk of falls? General instructions  Talk with your health care provider about your risks for falling. Tell your health care provider if: ? You fall. Be sure to tell your health care provider about all falls, even ones that seem minor. ? You feel dizzy, sleepy, or off-balance.  Take over-the-counter and prescription medicines only as told by your health care provider. These include any supplements.  Eat a healthy diet and maintain a healthy weight. A healthy diet includes low-fat dairy products, low-fat (lean) meats, and fiber from whole grains, beans, and lots of fruits and vegetables. Home safety  Remove any tripping hazards, such as  rugs, cords, and clutter.  Install safety equipment such as grab bars in bathrooms and safety rails on stairs.  Keep rooms and walkways well-lit. Activity   Follow a regular exercise program to stay fit. This will help you maintain your balance. Ask your health care provider what types of exercise are appropriate for you.  If you need a cane or walker, use it as recommended by your health care provider.  Wear  supportive shoes that have nonskid soles. Lifestyle  Do not drink alcohol if your health care provider tells you not to drink.  If you drink alcohol, limit how much you have: ? 0-1 drink a day for women. ? 0-2 drinks a day for men.  Be aware of how much alcohol is in your drink. In the [place] one drink equals one typical bottle of beer (12 oz), one-half glass of wine (5 oz), or one shot of hard liquor (1 oz).  Do not use any products that contain nicotine or tobacco, such as cigarettes and e-cigarettes. If you need help quitting, ask your health care provider. Summary  Having a healthy lifestyle and getting preventive care can help to protect your health and wellness after age 84.  Screening and testing are the best way to find a health problem early and help you avoid having a fall. Early diagnosis and treatment give you the best chance for managing medical conditions that are more common for people who are older than age 83.  Falls are a major cause of broken bones and head injuries in people who are older than age 47. Take precautions to prevent a fall at home.  Work with your health care provider to learn what changes you can make to improve your health and wellness and to prevent falls. This information is not intended to replace advice given to you by your health care provider. Make sure you discuss any questions you have with your health care provider. Document Revised: 09/11/2018 Document Reviewed: 04/03/2017 Elsevier Patient Education  2020 [employer].

## 2020-02-19 NOTE — Progress Notes (Signed)
This visit was conducted in person.  BP 140/66 (BP Location: Left Arm, Patient Position: Sitting, Cuff Size: Normal)   Pulse 70   Temp 97.6 F (36.4 C) (Temporal)   Ht 4\' 11"  (1.499 m)   Wt 114 lb 4 oz (51.8 kg)   SpO2 98%   BMI 23.08 kg/m   BP Readings from Last 3 Encounters:  02/19/20 140/66  09/08/19 (!) 171/65  08/19/19 140/62    CC: AMW Subjective:    Patient ID: Taylor Castaneda, female    DOB: 02-May-1933, 86 y.o.   MRN: [id number]  HPI: Taylor Castaneda is a 86 y.o. female presenting on 02/19/2020 for Medicare Wellness (Pt accompanied by daughter, Taylor Castaneda- temp 98.4.)   Did not see health advisor this year.    Hearing Screening   125Hz  250Hz  500Hz  1000Hz  2000Hz  3000Hz  4000Hz  6000Hz  8000Hz   Right ear:   40 40 20  40    Left ear:   25 25 20   40    Vision Screening Comments: Last eye exam, 11/2019.    Office Visit from 02/19/2020 in [facility] [place]  PHQ-2 Total Score 2      Fall Risk  02/19/2020 03/10/2019 02/26/2019 02/18/2018 02/07/2017  Falls in the past year? 1 1 0 No Yes  Comment - - - - pt reports she blacked out and fell causing bruising to left foot  Number falls in past yr: 0 0 0 - 1  Injury with Fall? 0 0 - - Yes  Risk for fall due to : - - Medication side effect - -  Follow up - - Falls evaluation completed;Falls prevention discussed - -   DM - on insulin followed by endo Taylor Castaneda. Brings cbg's - 130-200 fasting, high 180s-200s at lunch and dinner. Takes 6-8u with "some" meals and lantus 14 units daily. Next endo appt is next month.   HTN - 90-130/50-100, HR 70s. Denies low BP readings.   Daughter brings up concern of possible depression - pt denies significant trouble with this but notes some anhedonia after pandemic - whoe routine changed with social distancing recommendations. No longer interested in aquatic exercises but has restarted morning prayer groups at church.   No appetite - lost sense of taste for years. Sense of smell preserved.    Preventative: COLONOSCOPY Date: 06/2013 1 hyperplastic polyp, ileocecal anastomosis Taylor Castaneda). H/o colon cancer.  Well woman with OBGYN [email or web address] -lastmammogram 2015. Last seen 2015. Declinesreturn.She does breast exams at home without concerns. Suggested return to [facility] for mammo - she declines.  DEXA - Date:11/2015T scorestable at-2.2at hip.Calcium stopped by endo.Good calcium in diet. Walks regularly. Fluyearly Pneumovax 2012, prevnar 2015 [place] 08/2019, 09/2019 Td 1998. zostavax 2009  shingrix - discussed- has not received yet  Advanced directive - scanned and in chart 02/2018. HCPOA are 10-23-1969 (daughter) then Taylor Castaneda). Does not want prolonged life support if terminal condition. Seat belt use discussed  Sunscreen use discussed. No changing moles on skin.  Exsmoker- quit 1975 Alcohol - none Dentist not regularly - has dentures Eye exam Q6 mo  Bowel - no constipation Bladder - no incontinence  Lives alone, no pets Neighbors keep an eye on each other - 12 unit condo.  Family nearby. Occ: retired Taylor Castaneda Activity: walking  Diet: good water, fruits/vegetables daily      Relevant past medical, surgical, family and social history reviewed and updated as indicated. Interim medical history since our last visit reviewed.  Allergies and medications reviewed and updated. Outpatient Medications Prior to Visit  Medication Sig Dispense Refill  . aspirin 81 MG EC tablet Take 1 tablet (81 mg total) by mouth daily. Swallow whole. 30 tablet 0  . B-D ULTRAFINE III SHORT PEN 31G X 8 MM MISC USE AS DIRECTED WITH INSULIN 100 each 3  . Biotin 10000 MCG TABS Take 1 tablet by mouth daily.    . Carboxymethylcellul-Glycerin (CLEAR EYES FOR DRY EYES) 1-0.25 % SOLN Place 1-2 drops into both eyes 3 (three) times daily as needed (for dry/irritated eyes.).    Marland Kitchen Cholecalciferol (VITAMIN D3) 25 MCG (1000 UT) CAPS Take 1 capsule (1,000 Units  total) by mouth daily. 30 capsule   . Cinnamon 500 MG capsule Take 500 mg by mouth daily.    . Ferrous Sulfate (IRON) 325 (65 Fe) MG TABS Take 1 tablet by mouth 2 (two) times a week. Takes Mon and [employer]    . HUMALOG KWIKPEN 100 UNIT/ML KiwkPen Inject 1-10 Units into the skin 3 (three) times daily before meals. Sliding scale  4  . LANTUS SOLOSTAR 100 UNIT/ML Solostar Pen Inject 10-16 Units into the skin at bedtime.     . levETIRAcetam (KEPPRA) 250 MG tablet Take 1 tablet (250 mg total) by mouth 2 (two) times daily. 180 tablet 4  . Multiple Vitamins-Minerals (PRESERVISION AREDS 2 PO) Take by mouth 2 (two) times daily.    . potassium chloride (K-DUR) 10 MEQ tablet TAKE 1  BY MOUTH ON MONDAY, [facility] AND FRIDAY (Patient taking differently: Take 10 mEq by mouth every Monday, Wednesday, and Friday. ) 36 tablet 3  . atorvastatin (LIPITOR) 40 MG tablet Take 1 tablet (40 mg total) by mouth daily at 6 PM. 1 tablet 0  . carvedilol (COREG) 12.5 MG tablet Take 1 tablet (12.5 mg total) by mouth 2 (two) times daily. 180 tablet 1  . colchicine 0.6 MG tablet Take 1 tablet (0.6 mg total) by mouth daily as needed (gout flare). First day of gout flare, may take 1 tablet twice daily. 30 tablet 1  . furosemide (LASIX) 20 MG tablet Take 1 tablet (20 mg total) by mouth daily. 90 tablet 1  . ramipril (ALTACE) 5 MG capsule Take 1 capsule by mouth once daily 90 capsule 0   No facility-administered medications prior to visit.     Per HPI unless specifically indicated in ROS section below Review of Systems  Constitutional: Positive for appetite change (decreased). Negative for activity change, chills, fatigue, fever and unexpected weight change.  HENT: Negative for hearing loss.   Eyes: Negative for visual disturbance.  Respiratory: Negative for cough, chest tightness, shortness of breath and wheezing.   Cardiovascular: Negative for chest pain, palpitations and leg swelling.  Gastrointestinal: Negative for abdominal  distention, abdominal pain, blood in stool, constipation, diarrhea, nausea and vomiting.  Genitourinary: Negative for difficulty urinating and hematuria.  Musculoskeletal: Negative for arthralgias, myalgias and neck pain.  Skin: Negative for rash.  Neurological: Negative for dizziness (unsteadiness), seizures, syncope and headaches.  Hematological: Negative for adenopathy. Does not bruise/bleed easily.  Psychiatric/Behavioral: Negative for dysphoric mood. The patient is not nervous/anxious.    Objective:  BP 140/66 (BP Location: Left Arm, Patient Position: Sitting, Cuff Size: Normal)   Pulse 70   Temp 97.6 F (36.4 C) (Temporal)   Ht 4\' 11"  (1.499 m)   Wt 114 lb 4 oz (51.8 kg)   SpO2 98%   BMI 23.08 kg/m   Wt Readings from Last 3 Encounters:  02/19/20 114 lb 4 oz (51.8 kg)  09/08/19 113 lb 12.8 oz (51.6 kg)  08/19/19 116 lb 1 oz (52.6 kg)      Physical Exam Vitals and nursing note reviewed.  Constitutional:      General: She is not in acute distress.    Appearance: Normal appearance. She is well-developed. She is not ill-appearing.     Comments: Has life alert on   HENT:     Head: Normocephalic and atraumatic.     Right Ear: Hearing, tympanic membrane, ear canal and external ear normal.     Left Ear: Hearing, tympanic membrane, ear canal and external ear normal.  Eyes:     General: No scleral icterus.    Extraocular Movements: Extraocular movements intact.     Conjunctiva/sclera: Conjunctivae normal.     Pupils: Pupils are equal, round, and reactive to light.  Neck:     Thyroid: No thyroid mass or thyromegaly.     Vascular: No carotid bruit.  Cardiovascular:     Rate and Rhythm: Normal rate and regular rhythm.     Pulses: Normal pulses.          Radial pulses are 2+ on the right side and 2+ on the left side.     Heart sounds: Normal heart sounds. No murmur heard.   Pulmonary:     Effort: Pulmonary effort is normal. No respiratory distress.     Breath sounds: Normal  breath sounds. No wheezing, rhonchi or rales.  Abdominal:     General: Abdomen is flat. Bowel sounds are normal. There is no distension.     Palpations: Abdomen is soft. There is no mass.     Tenderness: There is no abdominal tenderness. There is no guarding or rebound.     Hernia: No hernia is present.  Musculoskeletal:        General: Normal range of motion.     Cervical back: Normal range of motion and neck supple.     Right lower leg: No edema.     Left lower leg: No edema.  Lymphadenopathy:     Cervical: No cervical adenopathy.  Skin:    General: Skin is warm and dry.     Findings: No rash.     Comments: Sebaceous cyst to R lateral upper back   Neurological:     General: No focal deficit present.     Mental Status: She is alert and oriented to person, place, and time.     Comments:  CN grossly intact, station and gait intact Recall 3/3 Calculation 5/5 GNIYLF  Psychiatric:        Mood and Affect: Mood normal.        Behavior: Behavior normal.        Thought Content: Thought content normal.        Judgment: Judgment normal.       Results for orders placed or performed in visit on 02/12/20  Parathyroid hormone, intact (no Ca)  Result Value Ref Range   PTH 87 (H) 14 - 64 pg/mL  CBC with Differential/Platelet  Result Value Ref Range   WBC 8.7 4.0 - 10.5 K/uL   RBC 3.83 (L) 3.87 - 5.11 Mil/uL   Hemoglobin 12.4 12.0 - 15.0 g/dL   HCT 36.3 36 - 46 %   MCV 94.9 78.0 - 100.0 fl   MCHC 34.1 30.0 - 36.0 g/dL   RDW 12.8 11.5 - 15.5 %   Platelets 158.0 150 - 400 K/uL  Neutrophils Relative % 56.6 43 - 77 %   Lymphocytes Relative 27.3 12 - 46 %   Monocytes Relative 12.0 3 - 12 %   Eosinophils Relative 3.2 0 - 5 %   Basophils Relative 0.9 0 - 3 %   Neutro Abs 4.9 1.4 - 7.7 K/uL   Lymphs Abs 2.4 0.7 - 4.0 K/uL   Monocytes Absolute 1.0 0 - 1 K/uL   Eosinophils Absolute 0.3 0 - 0 K/uL   Basophils Absolute 0.1 0 - 0 K/uL  Uric acid  Result Value Ref Range   Uric Acid, Serum  9.5 (H) 2.4 - 7.0 mg/dL  Hemoglobin A1c  Result Value Ref Range   Hgb A1c MFr Bld 8.7 (H) 4.6 - 6.5 %  Comprehensive metabolic panel  Result Value Ref Range   Sodium 144 135 - 145 mEq/L   Potassium 4.5 3.5 - 5.1 mEq/L   Chloride 108 96 - 112 mEq/L   CO2 28 19 - 32 mEq/L   Glucose, Bld 158 (H) 70 - 99 mg/dL   BUN 47 (H) 6 - 23 mg/dL   Creatinine, Ser 1.41 (H) 0.40 - 1.20 mg/dL   Total Bilirubin 0.6 0.2 - 1.2 mg/dL   Alkaline Phosphatase 134 (H) 39 - 117 U/L   AST 21 0 - 37 U/L   ALT 24 0 - 35 U/L   Total Protein 6.7 6.0 - 8.3 g/dL   Albumin 3.9 3.5 - 5.2 g/dL   GFR 35.39 (L) >60.00 mL/min   Calcium 9.5 8.4 - 10.5 mg/dL  Lipid panel  Result Value Ref Range   Cholesterol 118 0 - 200 mg/dL   Triglycerides 127.0 0 - 149 mg/dL   HDL 32.30 (L) >39.00 mg/dL   VLDL 25.4 0.0 - 40.0 mg/dL   LDL Cholesterol 60 0 - 99 mg/dL   Total CHOL/HDL Ratio 4    NonHDL 85.83   VITAMIN D 25 Hydroxy (Vit-D Deficiency, Fractures)  Result Value Ref Range   VITD 41.03 30.00 - 100.00 ng/mL   Depression screen [facility] 2/9 02/19/2020 02/19/2020 02/26/2019 02/18/2018 02/07/2017  Decreased Interest 2 1 0 0 0  Down, Depressed, Hopeless 0 0 0 0 0  PHQ - 2 Score 2 1 0 0 0  Altered sleeping 2 - 0 0 0  Tired, decreased energy 2 - 0 0 0  Change in appetite 2 - 0 0 1  Feeling bad or failure about yourself  0 - 0 0 0  Trouble concentrating 0 - 0 0 0  Moving slowly or fidgety/restless 0 - 0 0 0  Suicidal thoughts 0 - 0 0 0  PHQ-9 Score 8 - 0 0 1  Difficult doing work/chores - - Not difficult at all Not difficult at all Somewhat difficult  Some recent data might be hidden   Assessment & Castaneda:  This visit occurred during the SARS-CoV-2 public health emergency.  Safety protocols were in place, including screening questions prior to the visit, additional usage of staff PPE, and extensive cleaning of exam room while observing appropriate contact time as indicated for disinfecting solutions.   Problem List Items Addressed  This Visit    Transient atrial fibrillation ([place])    Sounds regular.      Relevant Medications   atorvastatin (LIPITOR) 40 MG tablet   carvedilol (COREG) 12.5 MG tablet   furosemide (LASIX) 20 MG tablet   ramipril (ALTACE) 5 MG capsule   Seizure ([place])    New seizure fall 2020 ?hypoglylcemia related -  now on Keppra BID. Has decided to stop driving.       Secondary hyperparathyroidism of renal origin ([place])    Will continue to monitor PTH, phosphorus, vit D and Ca. If Ph elevated, would discuss dietary phosphorus restriction      PAD (peripheral artery disease) (HCC)    Continue aspirin and statin. Does not endorse claudication symptoms.       Relevant Medications   atorvastatin (LIPITOR) 40 MG tablet   carvedilol (COREG) 12.5 MG tablet   furosemide (LASIX) 20 MG tablet   ramipril (ALTACE) 5 MG capsule   Osteopenia    Latest DEXA 2015 with T score of -2.2 at hip - consider rpt.       Medicare annual wellness visit, subsequent - Primary    I have personally reviewed the Medicare Annual Wellness questionnaire and have noted 1. The patient's medical and social history 2. Their use of alcohol, tobacco or illicit drugs 3. Their current medications and supplements 4. The patient's functional ability including ADL's, fall risks, home safety risks and hearing or visual impairment. Cognitive function has been assessed and addressed as indicated.  5. Diet and physical activity 6. Evidence for depression or mood disorders The patients weight, height, BMI have been recorded in the chart. I have made referrals, counseling and provided education to the patient based on review of the above and I have provided the pt with a written personalized care Castaneda for preventive services. Provider list updated.. See scanned questionairre as needed for further documentation. Reviewed preventative protocols and updated unless pt declined.       MCI (mild cognitive impairment)    No concerns noted  today.  Back to living alone with supportive neighbors and family nearby. Has life alert.  Has decided to give up driving.       Hyperlipidemia associated with type 2 diabetes mellitus (HCC)    Chronic, stable with LDL at goal on atorvastatin. Continue. The ASCVD Risk score Mikey Bussing DC Jr., et al., 2013) failed to calculate for the following reasons:   The 2013 ASCVD risk score is only valid for ages 25 to 72       Relevant Medications   atorvastatin (LIPITOR) 40 MG tablet   ramipril (ALTACE) 5 MG capsule   History of gout    No recent flares. Has colchicine PRN. Not on regular urate lowering medication.       Health maintenance examination    Preventative protocols reviewed and updated unless pt declined. Discussed healthy diet and lifestyle.       Essential hypertension    Fluctuating readings some lows based on home log she brings however traditionally BP runs high. Today in office well controlled. No med changes made today. They will have friend RN ensure home BP cuff is accurate.       Relevant Medications   atorvastatin (LIPITOR) 40 MG tablet   carvedilol (COREG) 12.5 MG tablet   furosemide (LASIX) 20 MG tablet   ramipril (ALTACE) 5 MG capsule   Controlled type 2 diabetes mellitus with diabetic nephropathy, with long-term current use of insulin (HCC)    Followed by endo - appt next month.  A1c was elevated, she is not regular with mealtime insulin. Discussed will likely need higher basal insulin dose given high fasting sugars - advised they call Taylor Castaneda to review recent cbg's for med titration if needed.       Relevant Medications   atorvastatin (LIPITOR) 40 MG tablet   ramipril (  ALTACE) 5 MG capsule   CKD stage 3 due to type 2 diabetes mellitus ([place])    CKD stage 3 over the years - anticipate HTN and DM related. Continue to monitor. PTH elevation noted - see below.       Relevant Medications   atorvastatin (LIPITOR) 40 MG tablet   ramipril (ALTACE) 5 MG capsule    Anhedonia    Discussed this as well as interventions to help manage mood. Encouraged regular socialization, regular exercise routine. Update if worsening or overwhelming depression.  PHQ = 8       Other Visit Diagnoses    Need for influenza vaccination       Relevant Orders   Flu Vaccine QUAD High Dose(Fluad) (Completed)       Meds ordered this encounter  Medications  . atorvastatin (LIPITOR) 40 MG tablet    Sig: Take 1 tablet (40 mg total) by mouth daily at 6 PM.    Dispense:  90 tablet    Refill:  3  . carvedilol (COREG) 12.5 MG tablet    Sig: Take 1 tablet (12.5 mg total) by mouth 2 (two) times daily.    Dispense:  180 tablet    Refill:  3  . colchicine 0.6 MG tablet    Sig: Take 1 tablet (0.6 mg total) by mouth daily as needed (gout flare). First day of gout flare, may take 1 tablet twice daily.    Dispense:  30 tablet    Refill:  3  . furosemide (LASIX) 20 MG tablet    Sig: Take 1 tablet (20 mg total) by mouth daily.    Dispense:  90 tablet    Refill:  3  . ramipril (ALTACE) 5 MG capsule    Sig: Take 1 capsule (5 mg total) by mouth daily.    Dispense:  90 capsule    Refill:  3   Orders Placed This Encounter  Procedures  . Flu Vaccine QUAD High Dose(Fluad)    Patient instructions: Flu shot today  Bring home BP cuff to next visit to compare. Or check at home manually an ensure accurate readings.  If interested, check with pharmacy about new 2 shot shingles series (shingrix).  Contact neurology for keppra refill.  You are doing well today  Return as needed or in 6 months for blood pressure follow up visit.   Follow up Castaneda: Return in about 6 months (around 08/18/2020), or if symptoms worsen or fail to improve, for follow up visit.  [name], MD

## 2020-02-20 DIAGNOSIS — R4589 Other symptoms and signs involving emotional state: Secondary | ICD-10-CM | POA: Insufficient documentation

## 2020-02-20 DIAGNOSIS — R4584 Anhedonia: Secondary | ICD-10-CM | POA: Insufficient documentation

## 2020-02-20 DIAGNOSIS — N2581 Secondary hyperparathyroidism of renal origin: Secondary | ICD-10-CM | POA: Insufficient documentation

## 2020-02-20 NOTE — Assessment & Plan Note (Addendum)
Latest DEXA 2015 with T score of -2.2 at hip - consider rpt.

## 2020-02-20 NOTE — Assessment & Plan Note (Signed)
Sounds regular.

## 2020-02-20 NOTE — Assessment & Plan Note (Signed)
Preventative protocols reviewed and updated unless pt declined. Discussed healthy diet and lifestyle.  

## 2020-02-20 NOTE — Assessment & Plan Note (Signed)
New seizure fall 2020 ?hypoglylcemia related - now on Keppra BID. Has decided to stop driving.

## 2020-02-20 NOTE — Assessment & Plan Note (Signed)
Discussed this as well as interventions to help manage mood. Encouraged regular socialization, regular exercise routine. Update if worsening or overwhelming depression.  PHQ = 8

## 2020-02-20 NOTE — Assessment & Plan Note (Signed)
Chronic, stable with LDL at goal on atorvastatin. Continue. The ASCVD Risk score Mikey Bussing DC Jr., et al., 2013) failed to calculate for the following reasons:   The 2013 ASCVD risk score is only valid for ages 59 to 45

## 2020-02-20 NOTE — Assessment & Plan Note (Addendum)
CKD stage 3 over the years - anticipate HTN and DM related. Continue to monitor. PTH elevation noted - see below.

## 2020-02-20 NOTE — Assessment & Plan Note (Signed)
Continue aspirin and statin. Does not endorse claudication symptoms.

## 2020-02-20 NOTE — Assessment & Plan Note (Addendum)
No recent flares. Has colchicine PRN. Not on regular urate lowering medication.

## 2020-02-20 NOTE — Assessment & Plan Note (Addendum)
Will continue to monitor PTH, phosphorus, vit D and Ca. If Ph elevated, would discuss dietary phosphorus restriction

## 2020-02-20 NOTE — Assessment & Plan Note (Signed)
Fluctuating readings some lows based on home log she brings however traditionally BP runs high. Today in office well controlled. No med changes made today. They will have friend RN ensure home BP cuff is accurate.

## 2020-02-20 NOTE — Assessment & Plan Note (Addendum)
No concerns noted today.  Back to living alone with supportive neighbors and family nearby. Has life alert.  Has decided to give up driving.

## 2020-02-20 NOTE — Assessment & Plan Note (Signed)
Followed by endo - appt next month.  A1c was elevated, she is not regular with mealtime insulin. Discussed will likely need higher basal insulin dose given high fasting sugars - advised they call Dr Chalmers Cater to review recent cbg's for med titration if needed.

## 2020-02-20 NOTE — Assessment & Plan Note (Signed)

## 2020-02-22 NOTE — Patient Instructions (Addendum)
Visit Information  Thank you for your time discussing your medications. I look forward to working with you to achieve your health care goals. Below is a summary of what we talked about during our visit.   Goals Addressed            This Visit's Progress   . Pharmacy Care Plan       CARE PLAN ENTRY (see longitudinal plan of care for additional care plan information)  Current Barriers:  . Chronic Disease Management support, education, and care coordination needs related to Hypertension, Hyperlipidemia, and Diabetes   Hypertension BP Readings from Last 3 Encounters:  09/08/19 (!) 171/65  08/19/19 140/62  06/02/19 104/60   . Pharmacist Clinical Goal(s): o Over the next 90 days, patient will work with PharmD and providers to achieve BP goal <140/90 . Current regimen:   Carvedilol 12.5 mg bid  Furosemide 20 mg daily  Ramipril 5 mg daily . Interventions: o Reviewed medication list.  o Patient reports lower blood pressure readings at home. Provider encouraged her to check her blood pressure cuff against manual cuff or bring in office to compare.  o Patient encouraged to continue exercise regimen.  . Patient self care activities - Over the next 90 days, patient will: o Check BP daily, document, and provide at future appointments o Ensure daily salt intake < 2300 mg/day  Hyperlipidemia Lab Results  Component Value Date/Time   LDLCALC 60 02/12/2020 07:59 AM   LDLDIRECT 103.0 02/18/2018 09:36 AM   . Pharmacist Clinical Goal(s): o Over the next 90 days, patient will work with PharmD and providers to maintain LDL goal < 70  . Current regimen:  . Atorvastatin 40 mg daily at 6 pm . Aspirin 81 EC mg  . Interventions: o Reviewed medication list.  o Patient reports taking as directed and denies adverse effects.  . Patient self care activities - Over the next 90 days, patient will: o Continue to take medication as prescribed.   Diabetes Lab Results  Component Value Date/Time    HGBA1C 8.7 (H) 02/12/2020 07:59 AM   HGBA1C 7 04/06/2019 12:00 AM   HGBA1C 7.4 (H) 02/26/2019 08:55 AM   . Pharmacist Clinical Goal(s): o Over the next 90 days, patient will work with PharmD and providers to achieve A1c goal <8% . Current regimen:   Humalog kwikpen 1-10 units tid (sliding scale)  Lantus Solostar 16 units into the skin at bedtime  Cinnamon 500 mg daily  BD Ultrafine Pen needle . Interventions: o Recommend patient provide blood sugar log at upcoming Endocrinology appointment.  . Patient self care activities - Over the next 90 days, patient will: o Check blood sugar once daily, document, and provide at future appointments o Contact provider with any episodes of hypoglycemia Medication management . Pharmacist Clinical Goal(s): o Over the next 90 days, patient will work with PharmD and providers to maintain optimal medication adherence . Current pharmacy: [facility] . Interventions o Comprehensive medication review performed. o Continue current medication management strategy . Patient self care activities - Over the next 90 days, patient will: o Focus on medication adherence by continuing to use pill box.  o Take medications as prescribed o Report any questions or concerns to PharmD and/or provider(s)  Initial goal documentation        Taylor Castaneda was given information about Chronic Care Management services today including:  1. CCM service includes personalized support from designated clinical staff supervised by her physician, including individualized plan of care  and coordination with other care providers 2. 24/7 contact phone numbers for assistance for urgent and routine care needs. 3. Standard insurance, coinsurance, copays and deductibles apply for chronic care management only during months in which we provide at least 20 minutes of these services. Most insurances cover these services at 100%, however patients may be responsible for any copay,  coinsurance and/or deductible if applicable. This service may help you avoid the need for more expensive face-to-face services. 4. Only one practitioner may furnish and bill the service in a calendar month. 5. The patient may stop CCM services at any time (effective at the end of the month) by phone call to the office staff.  Patient agreed to services and verbal consent obtained.   The patient verbalized understanding of instructions provided today and declined a print copy of patient instruction materials.  Telephone follow up appointment with pharmacy team member scheduled for: March 2022  Sherre Poot, PharmD Clinical Pharmacist [place] [phone] (office) [phone] (mobile)

## 2020-02-23 NOTE — Progress Notes (Signed)
I have collaborated with the care management provider regarding care management and care coordination activities outlined in this encounter and have reviewed this encounter including documentation in the note and care plan. I am certifying that I agree with the content of this note and encounter as supervising physician.  

## 2020-02-29 ENCOUNTER — Ambulatory Visit: Payer: Medicare Other

## 2020-03-08 ENCOUNTER — Ambulatory Visit: Payer: Medicare Other

## 2020-03-25 ENCOUNTER — Other Ambulatory Visit: Payer: Self-pay | Admitting: Diagnostic Neuroimaging

## 2020-03-28 ENCOUNTER — Encounter: Payer: Self-pay | Admitting: Family Medicine

## 2020-04-13 DIAGNOSIS — G609 Hereditary and idiopathic neuropathy, unspecified: Secondary | ICD-10-CM | POA: Diagnosis not present

## 2020-04-13 DIAGNOSIS — I10 Essential (primary) hypertension: Secondary | ICD-10-CM | POA: Diagnosis not present

## 2020-04-13 DIAGNOSIS — E1165 Type 2 diabetes mellitus with hyperglycemia: Secondary | ICD-10-CM | POA: Diagnosis not present

## 2020-04-13 DIAGNOSIS — R809 Proteinuria, unspecified: Secondary | ICD-10-CM | POA: Diagnosis not present

## 2020-04-19 ENCOUNTER — Other Ambulatory Visit: Payer: Self-pay | Admitting: Family Medicine

## 2020-05-04 DIAGNOSIS — I639 Cerebral infarction, unspecified: Secondary | ICD-10-CM

## 2020-05-04 HISTORY — DX: Cerebral infarction, unspecified: I63.9

## 2020-05-05 DIAGNOSIS — H402232 Chronic angle-closure glaucoma, bilateral, moderate stage: Secondary | ICD-10-CM | POA: Diagnosis not present

## 2020-05-05 LAB — HM DIABETES EYE EXAM

## 2020-05-06 ENCOUNTER — Encounter: Payer: Self-pay | Admitting: Family Medicine

## 2020-05-06 DIAGNOSIS — H353190 Nonexudative age-related macular degeneration, unspecified eye, stage unspecified: Secondary | ICD-10-CM | POA: Insufficient documentation

## 2020-05-09 ENCOUNTER — Encounter: Payer: Self-pay | Admitting: Family Medicine

## 2020-05-13 ENCOUNTER — Other Ambulatory Visit: Payer: Self-pay | Admitting: Family Medicine

## 2020-05-29 ENCOUNTER — Emergency Department
Admission: EM | Admit: 2020-05-29 | Discharge: 2020-05-30 | Disposition: A | Discharge status: Still In Hospital | Payer: Medicare Other | Attending: Emergency Medicine | Admitting: Emergency Medicine

## 2020-05-29 ENCOUNTER — Emergency Department: Payer: Medicare Other

## 2020-05-29 DIAGNOSIS — K8021 Calculus of gallbladder without cholecystitis with obstruction: Secondary | ICD-10-CM

## 2020-05-29 DIAGNOSIS — E1151 Type 2 diabetes mellitus with diabetic peripheral angiopathy without gangrene: Secondary | ICD-10-CM | POA: Diagnosis not present

## 2020-05-29 DIAGNOSIS — M549 Dorsalgia, unspecified: Secondary | ICD-10-CM | POA: Diagnosis present

## 2020-05-29 DIAGNOSIS — R531 Weakness: Secondary | ICD-10-CM | POA: Diagnosis not present

## 2020-05-29 DIAGNOSIS — Z794 Long term (current) use of insulin: Secondary | ICD-10-CM | POA: Insufficient documentation

## 2020-05-29 DIAGNOSIS — E1169 Type 2 diabetes mellitus with other specified complication: Secondary | ICD-10-CM | POA: Insufficient documentation

## 2020-05-29 DIAGNOSIS — W19XXXA Unspecified fall, initial encounter: Secondary | ICD-10-CM | POA: Diagnosis not present

## 2020-05-29 DIAGNOSIS — R569 Unspecified convulsions: Secondary | ICD-10-CM | POA: Insufficient documentation

## 2020-05-29 DIAGNOSIS — Z20822 Contact with and (suspected) exposure to covid-19: Secondary | ICD-10-CM | POA: Diagnosis not present

## 2020-05-29 DIAGNOSIS — R7401 Elevation of levels of liver transaminase levels: Secondary | ICD-10-CM | POA: Diagnosis not present

## 2020-05-29 DIAGNOSIS — R079 Chest pain, unspecified: Secondary | ICD-10-CM | POA: Diagnosis not present

## 2020-05-29 DIAGNOSIS — Z96652 Presence of left artificial knee joint: Secondary | ICD-10-CM | POA: Insufficient documentation

## 2020-05-29 DIAGNOSIS — Z7982 Long term (current) use of aspirin: Secondary | ICD-10-CM | POA: Insufficient documentation

## 2020-05-29 DIAGNOSIS — E785 Hyperlipidemia, unspecified: Secondary | ICD-10-CM | POA: Insufficient documentation

## 2020-05-29 DIAGNOSIS — Z87891 Personal history of nicotine dependence: Secondary | ICD-10-CM | POA: Insufficient documentation

## 2020-05-29 DIAGNOSIS — R109 Unspecified abdominal pain: Secondary | ICD-10-CM

## 2020-05-29 DIAGNOSIS — Z79899 Other long term (current) drug therapy: Secondary | ICD-10-CM | POA: Insufficient documentation

## 2020-05-29 DIAGNOSIS — K8020 Calculus of gallbladder without cholecystitis without obstruction: Secondary | ICD-10-CM | POA: Diagnosis not present

## 2020-05-29 DIAGNOSIS — I129 Hypertensive chronic kidney disease with stage 1 through stage 4 chronic kidney disease, or unspecified chronic kidney disease: Secondary | ICD-10-CM | POA: Insufficient documentation

## 2020-05-29 DIAGNOSIS — N1832 Chronic kidney disease, stage 3b: Secondary | ICD-10-CM | POA: Insufficient documentation

## 2020-05-29 DIAGNOSIS — K8050 Calculus of bile duct without cholangitis or cholecystitis without obstruction: Secondary | ICD-10-CM | POA: Diagnosis present

## 2020-05-29 DIAGNOSIS — K760 Fatty (change of) liver, not elsewhere classified: Secondary | ICD-10-CM | POA: Diagnosis not present

## 2020-05-29 DIAGNOSIS — Z85038 Personal history of other malignant neoplasm of large intestine: Secondary | ICD-10-CM | POA: Insufficient documentation

## 2020-05-29 DIAGNOSIS — R52 Pain, unspecified: Secondary | ICD-10-CM

## 2020-05-29 DIAGNOSIS — R935 Abnormal findings on diagnostic imaging of other abdominal regions, including retroperitoneum: Secondary | ICD-10-CM | POA: Diagnosis not present

## 2020-05-29 LAB — CBC WITH DIFFERENTIAL/PLATELET
Abs Immature Granulocytes: 0.11 10*3/uL — ABNORMAL HIGH (ref 0.00–0.07)
Basophils Absolute: 0 10*3/uL (ref 0.0–0.1)
Basophils Relative: 0 %
Eosinophils Absolute: 0 10*3/uL (ref 0.0–0.5)
Eosinophils Relative: 0 %
HCT: 35 % — ABNORMAL LOW (ref 36.0–46.0)
Hemoglobin: 11.4 g/dL — ABNORMAL LOW (ref 12.0–15.0)
Immature Granulocytes: 1 %
Lymphocytes Relative: 3 %
Lymphs Abs: 0.6 10*3/uL — ABNORMAL LOW (ref 0.7–4.0)
MCH: 32 pg (ref 26.0–34.0)
MCHC: 32.6 g/dL (ref 30.0–36.0)
MCV: 98.3 fL (ref 80.0–100.0)
Monocytes Absolute: 0.7 10*3/uL (ref 0.1–1.0)
Monocytes Relative: 4 %
Neutro Abs: 16.3 10*3/uL — ABNORMAL HIGH (ref 1.7–7.7)
Neutrophils Relative %: 92 %
Platelets: 156 10*3/uL (ref 150–400)
RBC: 3.56 MIL/uL — ABNORMAL LOW (ref 3.87–5.11)
RDW: 12.2 % (ref 11.5–15.5)
WBC: 17.8 10*3/uL — ABNORMAL HIGH (ref 4.0–10.5)
nRBC: 0 % (ref 0.0–0.2)

## 2020-05-29 LAB — COMPREHENSIVE METABOLIC PANEL
ALT: 235 U/L — ABNORMAL HIGH (ref 0–44)
AST: 315 U/L — ABNORMAL HIGH (ref 15–41)
Albumin: 3.2 g/dL — ABNORMAL LOW (ref 3.5–5.0)
Alkaline Phosphatase: 282 U/L — ABNORMAL HIGH (ref 38–126)
Anion gap: 13 (ref 5–15)
BUN: 37 mg/dL — ABNORMAL HIGH (ref 8–23)
CO2: 22 mmol/L (ref 22–32)
Calcium: 8.8 mg/dL — ABNORMAL LOW (ref 8.9–10.3)
Chloride: 104 mmol/L (ref 98–111)
Creatinine, Ser: 1.4 mg/dL — ABNORMAL HIGH (ref 0.44–1.00)
GFR, Estimated: 37 mL/min — ABNORMAL LOW (ref >60)
Glucose, Bld: 205 mg/dL — ABNORMAL HIGH (ref 70–99)
Potassium: 4.3 mmol/L (ref 3.5–5.1)
Sodium: 139 mmol/L (ref 135–145)
Total Bilirubin: 3.3 mg/dL — ABNORMAL HIGH (ref 0.3–1.2)
Total Protein: 6.3 g/dL — ABNORMAL LOW (ref 6.5–8.1)

## 2020-05-29 LAB — LIPASE, BLOOD: Lipase: 25 U/L (ref 11–51)

## 2020-05-29 LAB — LACTIC ACID, PLASMA
Lactic Acid, Venous: 4 mmol/L (ref 0.5–1.9)
Lactic Acid, Venous: 2.6 mmol/L (ref 0.5–1.9)

## 2020-05-29 LAB — RESP PANEL BY RT-PCR (FLU A&B, COVID) ARPGX2
Influenza A by PCR: NEGATIVE
Influenza B by PCR: NEGATIVE
SARS Coronavirus 2 by RT PCR: NEGATIVE

## 2020-05-29 LAB — TROPONIN I (HIGH SENSITIVITY)
Troponin I (High Sensitivity): 21 ng/L — ABNORMAL HIGH (ref <18)
Troponin I (High Sensitivity): 18 ng/L — ABNORMAL HIGH (ref <18)

## 2020-05-29 MED ORDER — LEVETIRACETAM IN NACL 1000 MG/100ML IV SOLN
1000.0000 mg | Freq: Once | INTRAVENOUS | Status: AC
  Administered 2020-05-29: 1000 mg via INTRAVENOUS
  Filled 2020-05-29: qty 100

## 2020-05-29 MED ORDER — SODIUM CHLORIDE 0.9 % IV BOLUS
500.0000 mL | Freq: Once | INTRAVENOUS | Status: AC
  Administered 2020-05-29: 500 mL via INTRAVENOUS

## 2020-05-29 NOTE — ED Provider Notes (Signed)
[facility] Emergency Department Provider Note ____________________________________________   Event Date/Time   First MD Initiated Contact with Patient 05/29/20 1934     (approximate)  I have reviewed the triage vital signs and the nursing notes.  HISTORY  Chief Complaint Seizures (Seizure/Fall/Chest pain)   HPI Taylor Castaneda is a 86 y.o. femalewho presents to the ED for evaluation of seizure and possible fall.  Chart review indicates history of DM on insulin, seizure disorder on Keppra, HTN. Patient lives in an apartment by herself and handles her own medications.  Patient presents to the ED with complaints of back pain today and a seizure-like episode.  Patient reports being in her typical state of health yesterday, and reports "feeling poorly" today and reports atraumatic back pain alongside this.  She reports talking to her daughter over the phone, where she further reported back pain and feeling poorly so the daughter came over to visit her.  Daughter reportedly found the patient on the floor after a fall versus seizure, but patient was awake and daughter helped her up onto a chair.  EMS reports finding her "awake but slow," and they further reports a witnessed generalized tonic-clonic seizure lasting about 1 minute and self resolving.  Patient was incontinent of urine during this episode.  They did not provide any abortive medications.  They deny any additional seizure episodes or a significant postictal period.  Here in the ED, patient reports that she feels well and has no complaints.  She denies any chest pain, back pain, headache or recent falls.   Past Medical History:  Diagnosis Date  . Anemia   . Diabetes mellitus, type II ([place])   . Glaucoma    Dr.Hecker  . History of colon cancer 1998   Dr. Lennie Hummer  . Hyperlipemia   . Osteopenia 02/2012, 03/2014   DEXA hip -2.2  . Renal insufficiency   . Seizures ([place])   . Stenosing tenosynovitis of  finger of left hand 2016   index - s/p steroid injection x2    Patient Active Problem List   Diagnosis Date Noted  . Dry age-related macular degeneration 05/06/2020  . Secondary hyperparathyroidism of renal origin ([place]) 02/20/2020  . Anhedonia 02/20/2020  . Seizure ([place]) 03/18/2019  . Transient atrial fibrillation ([place]) 03/18/2019  . MCI (mild cognitive impairment) 03/18/2019  . History of gout 04/29/2017  . OA (osteoarthritis) of knee 12/17/2016  . Dry skin dermatitis 06/13/2016  . Health maintenance examination 01/30/2016  . Medicare annual wellness visit, subsequent 03/10/2014  . Advanced care planning/counseling discussion 03/10/2014  . PAD (peripheral artery disease) ([place]) 11/11/2013  . Dyspnea 09/18/2011  . PMR (polymyalgia rheumatica) ([place]) 08/27/2011  . Controlled type 2 diabetes mellitus with diabetic nephropathy, with long-term current use of insulin ([place]) 08/04/2009  . Essential hypertension 06/13/2009  . CKD stage 3 due to type 2 diabetes mellitus ([place]) 01/24/2009  . Primary angle-closure glaucoma 11/13/2007  . Osteopenia 11/13/2007  . History of colon cancer 11/13/2007  . Hyperlipidemia associated with type 2 diabetes mellitus ([place]) 10/23/2006    Past Surgical History:  Procedure Laterality Date  . ABI  11/2013   WNL  . APPENDECTOMY    . CARPAL TUNNEL RELEASE     Bilateral   . CATARACT EXTRACTION Bilateral 2006   Implants  . COLECTOMY  1998   For cancer  . COLONOSCOPY  2008   Dr.Stark, Due 2011  . COLONOSCOPY  06/2013   1 hyperplastic polyp, ileocecal anastomosis [facility])  .  dexa  02/2012, 03/2014   T score -2.2 at hip overall stable  . REFRACTIVE SURGERY    . TONSILLECTOMY AND ADENOIDECTOMY    . TOTAL KNEE ARTHROPLASTY Left 12/17/2016   Procedure: LEFT TOTAL KNEE ARTHROPLASTY;  Surgeon: Gaynelle Arabian, MD;  Location: [facility] ORS;  Service: Orthopedics;  Laterality: Left;    Prior to Admission medications   Medication Sig Start Date End Date Taking?  Authorizing Provider  aspirin 81 MG EC tablet Take 1 tablet (81 mg total) by mouth daily. Swallow whole. 03/07/19   Mariel Aloe, MD  atorvastatin (LIPITOR) 40 MG tablet Take 1 tablet (40 mg total) by mouth daily at 6 PM. 02/19/20   [name], MD  B-D ULTRAFINE III SHORT PEN 31G X 8 MM MISC USE AS DIRECTED WITH INSULIN 07/09/11   Hendricks Limes, MD  Biotin 10000 MCG TABS Take 1 tablet by mouth daily.    [provider]  Carboxymethylcellul-Glycerin (CLEAR EYES FOR DRY EYES) 1-0.25 % SOLN Place 1-2 drops into both eyes 3 (three) times daily as needed (for dry/irritated eyes.).    [provider]  carvedilol (COREG) 12.5 MG tablet Take 1 tablet (12.5 mg total) by mouth 2 (two) times daily. 02/19/20   [name], MD  Cholecalciferol (VITAMIN D3) 25 MCG (1000 UT) CAPS Take 1 capsule (1,000 Units total) by mouth daily. 03/18/19   [name], MD  Cinnamon 500 MG capsule Take 500 mg by mouth daily.    [provider]  colchicine 0.6 MG tablet Take 1 tablet (0.6 mg total) by mouth daily as needed (gout flare). First day of gout flare, may take 1 tablet twice daily. 02/19/20   [name], MD  Ferrous Sulfate (IRON) 325 (65 Fe) MG TABS Take 1 tablet by mouth 2 (two) times a week. Takes Mon and [employer], Historical, MD  furosemide (LASIX) 20 MG tablet Take 1 tablet by mouth once daily 05/13/20   [name], MD  HUMALOG KWIKPEN 100 UNIT/ML KiwkPen Inject 1-10 Units into the skin 3 (three) times daily before meals. Sliding scale 10/04/16   [provider]  LANTUS SOLOSTAR 100 UNIT/ML Solostar Pen Inject 10-16 Units into the skin at bedtime.  08/19/19   [name], MD  levETIRAcetam (KEPPRA) 250 MG tablet Take 1 tablet by mouth twice daily 03/25/20   Lomax, Amy, NP  Multiple Vitamins-Minerals (PRESERVISION AREDS 2 PO) Take by mouth 2 (two) times daily.    [provider]  potassium chloride (KLOR-CON) 10 MEQ tablet TAKE 1  TABLET BY MOUTH ON Adelfa [facility] AND FRIDAY 04/20/20   [name], MD  ramipril (ALTACE) 5 MG capsule Take 1 capsule by mouth once daily 05/13/20   [name], MD    Allergies Pravastatin sodium  Family History  Problem Relation Age of Onset  . Heart attack Father 69  . Stroke Father 81  . Diabetes Father   . Hypertension Father   . Heart disease Father        before age 12  . Breast cancer Mother   . Cancer Mother   . Diabetes Sister   . Cancer Sister   . Peripheral vascular disease Sister   . Breast cancer Sister   . Hypertension Son   . COPD Neg Hx   . Asthma Neg Hx   . Colon cancer Neg Hx   . Esophageal cancer Neg Hx   . Rectal cancer Neg Hx   . Stomach cancer Neg Hx  Social History Social History   Tobacco Use  . Smoking status: Former Smoker    Years: 2.00    Types: Cigarettes    Quit date: 06/04/1973    Years since quitting: 47.0  . Smokeless tobacco: Never Used  . Tobacco comment: Smoked for 5 years 1965-1970 up to 1/2 pp week  Vaping Use  . Vaping Use: Never used  Substance Use Topics  . Alcohol use: Not Currently  . Drug use: No    Review of Systems  Constitutional: No fever/chills Eyes: No visual changes. ENT: No sore throat. Cardiovascular: Denies chest pain. Respiratory: Denies shortness of breath. Gastrointestinal: No abdominal pain.  No nausea, no vomiting.  No diarrhea.  No constipation. Genitourinary: Negative for dysuria. Musculoskeletal: Negative for back pain. Skin: Negative for rash. Neurological: Negative for headaches, focal weakness or numbness.  Positive for seizure-like activity  ____________________________________________   PHYSICAL EXAM:  VITAL SIGNS: Vitals:   05/29/20 1937  BP: (!) 105/48  Pulse: 72  Resp: 18  Temp: 97.8 F (36.6 C)  SpO2: 100%    Constitutional: Alert and oriented. Well appearing and in no acute distress. Eyes: Conjunctivae are normal. PERRL. EOMI. Head: Atraumatic. Nose:  No congestion/rhinnorhea. Mouth/Throat: Mucous membranes are moist.  Oropharynx non-erythematous. Neck: No stridor. No cervical spine tenderness to palpation. Cardiovascular: Normal rate, regular rhythm. Grossly normal heart sounds.  Good peripheral circulation. Respiratory: Normal respiratory effort.  No retractions. Lungs CTAB. Gastrointestinal: Soft , nondistended, nontender to palpation. No CVA tenderness. Musculoskeletal: No lower extremity tenderness nor edema.  No joint effusions.  No signs of acute trauma to the back.  No spinal step-offs.  Vague tenderness to palpation to her lower T-spine.  Neurologic:  Normal speech and language. No gross focal neurologic deficits are appreciated.  Cranial nerves II through XII intact 5/5 strength and sensation in all 4 extremities Skin:  Skin is warm, dry and intact. No rash noted. Psychiatric: Mood and affect are normal. Speech and behavior are normal.  ____________________________________________   LABS (all labs ordered are listed, but only abnormal results are displayed)  Labs Reviewed  CBC WITH DIFFERENTIAL/PLATELET - Abnormal; Notable for the following components:      Result Value   WBC 17.8 (*)    RBC 3.56 (*)    Hemoglobin 11.4 (*)    HCT 35.0 (*)    Neutro Abs 16.3 (*)    Lymphs Abs 0.6 (*)    Abs Immature Granulocytes 0.11 (*)    All other components within normal limits  COMPREHENSIVE METABOLIC PANEL - Abnormal; Notable for the following components:   Glucose, Bld 205 (*)    BUN 37 (*)    Creatinine, Ser 1.40 (*)    Calcium 8.8 (*)    Total Protein 6.3 (*)    Albumin 3.2 (*)    AST 315 (*)    ALT 235 (*)    Alkaline Phosphatase 282 (*)    Total Bilirubin 3.3 (*)    GFR, Estimated 37 (*)    All other components within normal limits  LACTIC ACID, PLASMA - Abnormal; Notable for the following components:   Lactic Acid, Venous 4.0 (*)    All other components within normal limits  LACTIC ACID, PLASMA - Abnormal; Notable  for the following components:   Lactic Acid, Venous 2.6 (*)    All other components within normal limits  TROPONIN I (HIGH SENSITIVITY) - Abnormal; Notable for the following components:   Troponin I (High Sensitivity) 21 (*)  All other components within normal limits  TROPONIN I (HIGH SENSITIVITY) - Abnormal; Notable for the following components:   Troponin I (High Sensitivity) 18 (*)    All other components within normal limits  RESP PANEL BY RT-PCR (FLU A&B, COVID) ARPGX2  LIPASE, BLOOD  URINALYSIS, COMPLETE (UACMP) WITH MICROSCOPIC   ____________________________________________  12 Lead EKG  Sinus rhythm, rate of 73 bpm.  Normal axis.  Normal intervals.  No evidence of acute ischemia. ____________________________________________  RADIOLOGY  ED MD interpretation: CT head and spine reviewed by me without evidence of acute ICH or vertebral fracture, respectively RUQ ultrasound reviewed by me with evidence of cholelithiasis  Official radiology report(s): CT Head Wo Contrast  Result Date: 05/29/2020 CLINICAL DATA:  Seizures.  Found on ground. EXAM: CT HEAD WITHOUT CONTRAST CT CERVICAL SPINE WITHOUT CONTRAST TECHNIQUE: Multidetector CT imaging of the head and cervical spine was performed following the standard protocol without intravenous contrast. Multiplanar CT image reconstructions of the cervical spine were also generated. COMPARISON:  Head CT 03/05/2019, head and neck CTA 03/05/2019, and head MRI 03/06/2019 FINDINGS: CT HEAD FINDINGS Brain: There is no evidence of an acute infarct, intracranial hemorrhage, mass, midline shift, or extra-axial fluid collection. Hypodensities in the cerebral white matter bilaterally are unchanged and nonspecific but compatible with mild chronic small vessel ischemic disease. A chronic lacunar infarct is again noted in the right basal ganglia. Moderately advanced cerebral atrophy is unchanged. Vascular: Calcified atherosclerosis at the skull base. No  hyperdense vessel. Skull: No fracture or suspicious osseous lesion. Sinuses/Orbits: Visualized paranasal sinuses and mastoid air cells are clear. Bilateral cataract extraction. Other: None. CT CERVICAL SPINE FINDINGS Alignment: Cervical spine straightening. Trace anterolisthesis of C4 on C5. Skull base and vertebrae: No acute fracture or suspicious osseous lesion. Moderate median C1-2 arthropathy. Soft tissues and spinal canal: No prevertebral fluid or swelling. No visible canal hematoma. Disc levels: Severe disc space narrowing at C5-6 and C6-7 with uncovertebral spurring resulting in only mild bilateral neural foraminal stenosis. Mild cervical facet arthrosis, most notable on the left at C4-5. Upper chest: Clear lung apices. Other: Mild carotid atherosclerosis. IMPRESSION: 1. No evidence of acute intracranial abnormality. 2. Mild chronic small vessel ischemic disease and moderately advanced cerebral atrophy. 3. No acute cervical spine fracture. Electronically Signed   By: Logan Bores M.D.   On: 05/29/2020 20:13   CT Cervical Spine Wo Contrast  Result Date: 05/29/2020 CLINICAL DATA:  Seizures.  Found on ground. EXAM: CT HEAD WITHOUT CONTRAST CT CERVICAL SPINE WITHOUT CONTRAST TECHNIQUE: Multidetector CT imaging of the head and cervical spine was performed following the standard protocol without intravenous contrast. Multiplanar CT image reconstructions of the cervical spine were also generated. COMPARISON:  Head CT 03/05/2019, head and neck CTA 03/05/2019, and head MRI 03/06/2019 FINDINGS: CT HEAD FINDINGS Brain: There is no evidence of an acute infarct, intracranial hemorrhage, mass, midline shift, or extra-axial fluid collection. Hypodensities in the cerebral white matter bilaterally are unchanged and nonspecific but compatible with mild chronic small vessel ischemic disease. A chronic lacunar infarct is again noted in the right basal ganglia. Moderately advanced cerebral atrophy is unchanged. Vascular:  Calcified atherosclerosis at the skull base. No hyperdense vessel. Skull: No fracture or suspicious osseous lesion. Sinuses/Orbits: Visualized paranasal sinuses and mastoid air cells are clear. Bilateral cataract extraction. Other: None. CT CERVICAL SPINE FINDINGS Alignment: Cervical spine straightening. Trace anterolisthesis of C4 on C5. Skull base and vertebrae: No acute fracture or suspicious osseous lesion. Moderate median C1-2 arthropathy. Soft tissues and  spinal canal: No prevertebral fluid or swelling. No visible canal hematoma. Disc levels: Severe disc space narrowing at C5-6 and C6-7 with uncovertebral spurring resulting in only mild bilateral neural foraminal stenosis. Mild cervical facet arthrosis, most notable on the left at C4-5. Upper chest: Clear lung apices. Other: Mild carotid atherosclerosis. IMPRESSION: 1. No evidence of acute intracranial abnormality. 2. Mild chronic small vessel ischemic disease and moderately advanced cerebral atrophy. 3. No acute cervical spine fracture. Electronically Signed   By: Logan Bores M.D.   On: 05/29/2020 20:13   CT Thoracic Spine Wo Contrast  Result Date: 05/29/2020 CLINICAL DATA:  Seizures.  Found on ground. EXAM: CT THORACIC AND LUMBAR SPINE WITHOUT CONTRAST TECHNIQUE: Multidetector CT imaging of the thoracic and lumbar spine was performed without contrast. Multiplanar CT image reconstructions were also generated. COMPARISON:  CT abdomen and pelvis 06/13/2009 FINDINGS: CT THORACIC SPINE FINDINGS Alignment: Mild thoracic dextroscoliosis.  No listhesis. Vertebrae: No acute fracture or suspicious osseous lesion. Paraspinal and other soft tissues: Aortic and coronary atherosclerosis. Disc levels: Mild-to-moderate multilevel disc degeneration and moderate facet arthrosis in the thoracic spine. No evidence of high-grade spinal stenosis. Moderate left neural foraminal stenosis at T11-12 due to prominent facet spurring. CT LUMBAR SPINE FINDINGS Segmentation: 5  lumbar type vertebrae. Alignment: Mild lumbar levoscoliosis.  No listhesis. Vertebrae: No acute fracture or suspicious osseous lesion. Paraspinal and other soft tissues: Nonobstructing 1.5 cm left renal stone, much larger than in 2011. Partially visualized left renal cyst measuring at least 3.8 cm. Abdominal aortic atherosclerosis without aneurysm. Disc levels: Mild disc space narrowing at L2-3 with severe narrowing at L4-5. Disc bulging and posterior element hypertrophy result in severe spinal stenosis at L4-5 and mild spinal stenosis at L3-4 greater than L2-3. There is moderate multilevel neural foraminal stenosis. IMPRESSION: 1. No acute osseous abnormality identified in the thoracic or lumbar spine. 2. Thoracic and lumbar disc and facet degeneration as above. Severe spinal stenosis at L4-5. 3. Large nonobstructing left renal stone. 4. Aortic Atherosclerosis (ICD10-I70.0). Electronically Signed   By: Logan Bores M.D.   On: 05/29/2020 20:53   CT Lumbar Spine Wo Contrast  Result Date: 05/29/2020 CLINICAL DATA:  Seizures.  Found on ground. EXAM: CT THORACIC AND LUMBAR SPINE WITHOUT CONTRAST TECHNIQUE: Multidetector CT imaging of the thoracic and lumbar spine was performed without contrast. Multiplanar CT image reconstructions were also generated. COMPARISON:  CT abdomen and pelvis 06/13/2009 FINDINGS: CT THORACIC SPINE FINDINGS Alignment: Mild thoracic dextroscoliosis.  No listhesis. Vertebrae: No acute fracture or suspicious osseous lesion. Paraspinal and other soft tissues: Aortic and coronary atherosclerosis. Disc levels: Mild-to-moderate multilevel disc degeneration and moderate facet arthrosis in the thoracic spine. No evidence of high-grade spinal stenosis. Moderate left neural foraminal stenosis at T11-12 due to prominent facet spurring. CT LUMBAR SPINE FINDINGS Segmentation: 5 lumbar type vertebrae. Alignment: Mild lumbar levoscoliosis.  No listhesis. Vertebrae: No acute fracture or suspicious osseous  lesion. Paraspinal and other soft tissues: Nonobstructing 1.5 cm left renal stone, much larger than in 2011. Partially visualized left renal cyst measuring at least 3.8 cm. Abdominal aortic atherosclerosis without aneurysm. Disc levels: Mild disc space narrowing at L2-3 with severe narrowing at L4-5. Disc bulging and posterior element hypertrophy result in severe spinal stenosis at L4-5 and mild spinal stenosis at L3-4 greater than L2-3. There is moderate multilevel neural foraminal stenosis. IMPRESSION: 1. No acute osseous abnormality identified in the thoracic or lumbar spine. 2. Thoracic and lumbar disc and facet degeneration as above. Severe spinal stenosis at L4-5.  3. Large nonobstructing left renal stone. 4. Aortic Atherosclerosis (ICD10-I70.0). Electronically Signed   By: Logan Bores M.D.   On: 05/29/2020 20:53   US Abdomen Limited RUQ (LIVER/GB)  Result Date: 05/29/2020 CLINICAL DATA:  86 year old female with right upper quadrant abdominal pain. EXAM: ULTRASOUND ABDOMEN LIMITED RIGHT UPPER QUADRANT COMPARISON:  Right upper quadrant ultrasound dated 06/13/2009. FINDINGS: Gallbladder: There is sludge and small stones within the gallbladder. There is no gallbladder wall thickening or pericholecystic fluid. Negative sonographic Murphy sign. Common bile duct: Diameter: 5 mm Liver: Coarsened liver echotexture with slight surface nodularity consistent morphologic changes of cirrhosis. Portal vein is patent on color Doppler imaging with normal direction of blood flow towards the liver. Other: None. IMPRESSION: 1. Cholelithiasis without sonographic evidence of acute cholecystitis. 2. Cirrhosis. 3. Patent main portal vein with hepatopetal flow. Electronically Signed   By: Anner Crete M.D.   On: 05/29/2020 22:19    ____________________________________________   PROCEDURES and INTERVENTIONS  Procedure(s) performed (including Critical Care):  .1-3 Lead EKG Interpretation Performed by: Vladimir Crofts,  MD Authorized by: Vladimir Crofts, MD     Interpretation: normal     ECG rate:  70   ECG rate assessment: normal     Rhythm: sinus rhythm     Ectopy: none     Conduction: normal      Medications  levETIRAcetam (KEPPRA) IVPB 1000 mg/100 mL premix (0 mg Intravenous Stopped 05/29/20 2148)  sodium chloride 0.9 % bolus 500 mL (0 mLs Intravenous Stopped 05/29/20 2149)    ____________________________________________   MDM / ED COURSE   86 year old woman with history of seizures presents from home with possible seizure-like activity, and abdominal/back pain, most attributable to cholelithiasis with possible choledocholithiasis.  Normal vitals on room air.  Exam is quite reassuring without evidence of distress, neurologic deficits, significant trauma or peritoneal abdomen.  She has some very mild RUQ tenderness to palpation but she minimizes, otherwise has a normal exam.  Blood work without evidence of biliary obstruction and transaminitis, which was not expected and precipitated RUQ ultrasound demonstrated cholelithiasis without evidence of cholecystitis or choledocholithiasis.  Due to her blood work derangements, I spoke with GI on-call, who recommends MRCP to assess for CBD stone.  Patient signed out to oncoming provider to follow-up in the study.  If no evidence of choledocholithiasis, anticipate should be amenable to outpatient management with close surgery follow-up to discuss cholecystectomy.  If evidence of choledocholithiasis, she will need ERCP and she may require transfer for this due to institutional limitations.  Clinical Course as of 05/29/20 2255  Nancy Fetter May 29, 2020  2049 Reassessed.  Patient reports she feels well.  Daughter now at the bedside and provides additional history.  Daughter indicates that patient seems normal at this time. [DS]  2142 Educated patient and daughter on blood work with transaminitis and possible biliary obstruction.  We discussed need for RUQ ultrasound and  possibly CT imaging thereafter.  Patient reports no pain at this time, but does report some mild RUQ tenderness to my palpation. [DS]  2228 Spoke with Dr. Marius Ditch, GI on call, and she recommends MRCP without CT imaging of abdomen/pelvis.  She further informs me that Dr. Allen Norris is currently on vacation and will not be here tomorrow/Monday, and may be here on Tuesday.  If this patient has choledocholithiasis, transfer may be necessary [DS]  2254 Reassessed.  Patient reports feeling well.  We discussed cholelithiasis and the possibility of choledocholithiasis.  We discussed need for MRCP to rule  this out.  We discussed disposition possibilities based off of these results. [DS]    Clinical Course User Index [DS] Vladimir Crofts, MD    ____________________________________________   FINAL CLINICAL IMPRESSION(S) / ED DIAGNOSES  Final diagnoses:  Pain  Calculus of gallbladder with biliary obstruction but without cholecystitis  Transaminitis     ED Discharge Orders    None       Saranda Legrande   Note:  This document was prepared using Dragon voice recognition software and may include unintentional dictation errors.   Vladimir Crofts, MD 05/29/20 [phone]

## 2020-05-29 NOTE — ED Notes (Signed)
Date and time results received: 05/29/20 2023 (use smartphrase ".now" to insert current time)  Test: lactic acid Critical Value: 4.0  Name of Provider Notified: [place]  Orders Received? Or Actions Taken?: awaiting orders

## 2020-05-29 NOTE — ED Triage Notes (Signed)
Pt with history of Sz found on groud today by family. Pt was placed in chair when EMS arrived, pt seized again. Generalized seizure. Pt was also incont of bladder. Pt arrived with 22 g in L hand

## 2020-05-30 ENCOUNTER — Other Ambulatory Visit: Payer: Self-pay

## 2020-05-30 ENCOUNTER — Encounter (HOSPITAL_COMMUNITY): Payer: Self-pay

## 2020-05-30 ENCOUNTER — Other Ambulatory Visit: Payer: Self-pay | Admitting: Physician Assistant

## 2020-05-30 ENCOUNTER — Inpatient Hospital Stay (HOSPITAL_COMMUNITY)
Admission: RE | Admit: 2020-05-30 | Discharge: 2020-06-10 | DRG: 417 | Disposition: A | Discharge status: Rehabilitation Facility | Payer: Medicare Other | Source: Other Acute Inpatient Hospital | Attending: Internal Medicine | Admitting: Internal Medicine

## 2020-05-30 DIAGNOSIS — K8689 Other specified diseases of pancreas: Secondary | ICD-10-CM | POA: Diagnosis present

## 2020-05-30 DIAGNOSIS — D62 Acute posthemorrhagic anemia: Secondary | ICD-10-CM | POA: Diagnosis not present

## 2020-05-30 DIAGNOSIS — K8030 Calculus of bile duct with cholangitis, unspecified, without obstruction: Secondary | ICD-10-CM | POA: Diagnosis not present

## 2020-05-30 DIAGNOSIS — K7460 Unspecified cirrhosis of liver: Secondary | ICD-10-CM | POA: Diagnosis present

## 2020-05-30 DIAGNOSIS — Z823 Family history of stroke: Secondary | ICD-10-CM

## 2020-05-30 DIAGNOSIS — K760 Fatty (change of) liver, not elsewhere classified: Secondary | ICD-10-CM | POA: Diagnosis present

## 2020-05-30 DIAGNOSIS — Z79899 Other long term (current) drug therapy: Secondary | ICD-10-CM

## 2020-05-30 DIAGNOSIS — K319 Disease of stomach and duodenum, unspecified: Secondary | ICD-10-CM | POA: Diagnosis present

## 2020-05-30 DIAGNOSIS — E1169 Type 2 diabetes mellitus with other specified complication: Secondary | ICD-10-CM | POA: Diagnosis not present

## 2020-05-30 DIAGNOSIS — G459 Transient cerebral ischemic attack, unspecified: Secondary | ICD-10-CM | POA: Diagnosis not present

## 2020-05-30 DIAGNOSIS — I6330 Cerebral infarction due to thrombosis of unspecified cerebral artery: Secondary | ICD-10-CM

## 2020-05-30 DIAGNOSIS — K831 Obstruction of bile duct: Secondary | ICD-10-CM

## 2020-05-30 DIAGNOSIS — N1831 Chronic kidney disease, stage 3a: Secondary | ICD-10-CM | POA: Diagnosis not present

## 2020-05-30 DIAGNOSIS — K8070 Calculus of gallbladder and bile duct without cholecystitis without obstruction: Secondary | ICD-10-CM

## 2020-05-30 DIAGNOSIS — R7401 Elevation of levels of liver transaminase levels: Secondary | ICD-10-CM

## 2020-05-30 DIAGNOSIS — I69320 Aphasia following cerebral infarction: Secondary | ICD-10-CM | POA: Diagnosis not present

## 2020-05-30 DIAGNOSIS — L89151 Pressure ulcer of sacral region, stage 1: Secondary | ICD-10-CM | POA: Diagnosis not present

## 2020-05-30 DIAGNOSIS — K8010 Calculus of gallbladder with chronic cholecystitis without obstruction: Secondary | ICD-10-CM | POA: Diagnosis not present

## 2020-05-30 DIAGNOSIS — I480 Paroxysmal atrial fibrillation: Secondary | ICD-10-CM | POA: Diagnosis not present

## 2020-05-30 DIAGNOSIS — R4701 Aphasia: Secondary | ICD-10-CM | POA: Diagnosis not present

## 2020-05-30 DIAGNOSIS — I639 Cerebral infarction, unspecified: Secondary | ICD-10-CM | POA: Diagnosis not present

## 2020-05-30 DIAGNOSIS — R935 Abnormal findings on diagnostic imaging of other abdominal regions, including retroperitoneum: Secondary | ICD-10-CM | POA: Diagnosis not present

## 2020-05-30 DIAGNOSIS — G40409 Other generalized epilepsy and epileptic syndromes, not intractable, without status epilepticus: Secondary | ICD-10-CM | POA: Diagnosis present

## 2020-05-30 DIAGNOSIS — K8020 Calculus of gallbladder without cholecystitis without obstruction: Secondary | ICD-10-CM | POA: Diagnosis present

## 2020-05-30 DIAGNOSIS — E1151 Type 2 diabetes mellitus with diabetic peripheral angiopathy without gangrene: Secondary | ICD-10-CM | POA: Diagnosis not present

## 2020-05-30 DIAGNOSIS — H409 Unspecified glaucoma: Secondary | ICD-10-CM | POA: Diagnosis not present

## 2020-05-30 DIAGNOSIS — K8063 Calculus of gallbladder and bile duct with acute cholecystitis with obstruction: Principal | ICD-10-CM | POA: Diagnosis present

## 2020-05-30 DIAGNOSIS — N200 Calculus of kidney: Secondary | ICD-10-CM | POA: Diagnosis present

## 2020-05-30 DIAGNOSIS — K8040 Calculus of bile duct with cholecystitis, unspecified, without obstruction: Secondary | ICD-10-CM | POA: Diagnosis not present

## 2020-05-30 DIAGNOSIS — R7989 Other specified abnormal findings of blood chemistry: Secondary | ICD-10-CM | POA: Diagnosis present

## 2020-05-30 DIAGNOSIS — K838 Other specified diseases of biliary tract: Secondary | ICD-10-CM | POA: Diagnosis not present

## 2020-05-30 DIAGNOSIS — D696 Thrombocytopenia, unspecified: Secondary | ICD-10-CM | POA: Diagnosis not present

## 2020-05-30 DIAGNOSIS — Z7982 Long term (current) use of aspirin: Secondary | ICD-10-CM

## 2020-05-30 DIAGNOSIS — Z803 Family history of malignant neoplasm of breast: Secondary | ICD-10-CM

## 2020-05-30 DIAGNOSIS — Z96652 Presence of left artificial knee joint: Secondary | ICD-10-CM | POA: Diagnosis not present

## 2020-05-30 DIAGNOSIS — R4189 Other symptoms and signs involving cognitive functions and awareness: Secondary | ICD-10-CM | POA: Diagnosis not present

## 2020-05-30 DIAGNOSIS — I679 Cerebrovascular disease, unspecified: Secondary | ICD-10-CM | POA: Diagnosis not present

## 2020-05-30 DIAGNOSIS — I6522 Occlusion and stenosis of left carotid artery: Secondary | ICD-10-CM | POA: Diagnosis not present

## 2020-05-30 DIAGNOSIS — N179 Acute kidney failure, unspecified: Secondary | ICD-10-CM | POA: Diagnosis not present

## 2020-05-30 DIAGNOSIS — D649 Anemia, unspecified: Secondary | ICD-10-CM

## 2020-05-30 DIAGNOSIS — Z87891 Personal history of nicotine dependence: Secondary | ICD-10-CM

## 2020-05-30 DIAGNOSIS — Z888 Allergy status to other drugs, medicaments and biological substances status: Secondary | ICD-10-CM

## 2020-05-30 DIAGNOSIS — N189 Chronic kidney disease, unspecified: Secondary | ICD-10-CM | POA: Diagnosis present

## 2020-05-30 DIAGNOSIS — Z794 Long term (current) use of insulin: Secondary | ICD-10-CM | POA: Diagnosis not present

## 2020-05-30 DIAGNOSIS — E1122 Type 2 diabetes mellitus with diabetic chronic kidney disease: Secondary | ICD-10-CM | POA: Diagnosis not present

## 2020-05-30 DIAGNOSIS — E119 Type 2 diabetes mellitus without complications: Secondary | ICD-10-CM | POA: Diagnosis not present

## 2020-05-30 DIAGNOSIS — I951 Orthostatic hypotension: Secondary | ICD-10-CM | POA: Diagnosis not present

## 2020-05-30 DIAGNOSIS — I5032 Chronic diastolic (congestive) heart failure: Secondary | ICD-10-CM | POA: Diagnosis present

## 2020-05-30 DIAGNOSIS — Z85038 Personal history of other malignant neoplasm of large intestine: Secondary | ICD-10-CM | POA: Diagnosis not present

## 2020-05-30 DIAGNOSIS — Z8249 Family history of ischemic heart disease and other diseases of the circulatory system: Secondary | ICD-10-CM

## 2020-05-30 DIAGNOSIS — M48061 Spinal stenosis, lumbar region without neurogenic claudication: Secondary | ICD-10-CM | POA: Diagnosis present

## 2020-05-30 DIAGNOSIS — N1832 Chronic kidney disease, stage 3b: Secondary | ICD-10-CM | POA: Diagnosis not present

## 2020-05-30 DIAGNOSIS — I6621 Occlusion and stenosis of right posterior cerebral artery: Secondary | ICD-10-CM | POA: Diagnosis not present

## 2020-05-30 DIAGNOSIS — K921 Melena: Secondary | ICD-10-CM | POA: Diagnosis not present

## 2020-05-30 DIAGNOSIS — I130 Hypertensive heart and chronic kidney disease with heart failure and stage 1 through stage 4 chronic kidney disease, or unspecified chronic kidney disease: Secondary | ICD-10-CM | POA: Diagnosis present

## 2020-05-30 DIAGNOSIS — E872 Acidosis: Secondary | ICD-10-CM | POA: Diagnosis present

## 2020-05-30 DIAGNOSIS — K3189 Other diseases of stomach and duodenum: Secondary | ICD-10-CM | POA: Diagnosis not present

## 2020-05-30 DIAGNOSIS — K8050 Calculus of bile duct without cholangitis or cholecystitis without obstruction: Secondary | ICD-10-CM | POA: Diagnosis not present

## 2020-05-30 DIAGNOSIS — K2950 Unspecified chronic gastritis without bleeding: Secondary | ICD-10-CM | POA: Diagnosis not present

## 2020-05-30 DIAGNOSIS — M8580 Other specified disorders of bone density and structure, unspecified site: Secondary | ICD-10-CM | POA: Diagnosis present

## 2020-05-30 DIAGNOSIS — D72829 Elevated white blood cell count, unspecified: Secondary | ICD-10-CM | POA: Diagnosis not present

## 2020-05-30 DIAGNOSIS — Z9049 Acquired absence of other specified parts of digestive tract: Secondary | ICD-10-CM | POA: Diagnosis not present

## 2020-05-30 DIAGNOSIS — I10 Essential (primary) hypertension: Secondary | ICD-10-CM | POA: Diagnosis not present

## 2020-05-30 DIAGNOSIS — I6381 Other cerebral infarction due to occlusion or stenosis of small artery: Secondary | ICD-10-CM | POA: Diagnosis not present

## 2020-05-30 DIAGNOSIS — Z833 Family history of diabetes mellitus: Secondary | ICD-10-CM

## 2020-05-30 DIAGNOSIS — R55 Syncope and collapse: Secondary | ICD-10-CM | POA: Diagnosis present

## 2020-05-30 DIAGNOSIS — K2970 Gastritis, unspecified, without bleeding: Secondary | ICD-10-CM | POA: Diagnosis not present

## 2020-05-30 DIAGNOSIS — E785 Hyperlipidemia, unspecified: Secondary | ICD-10-CM | POA: Diagnosis not present

## 2020-05-30 DIAGNOSIS — K660 Peritoneal adhesions (postprocedural) (postinfection): Secondary | ICD-10-CM | POA: Diagnosis present

## 2020-05-30 DIAGNOSIS — I129 Hypertensive chronic kidney disease with stage 1 through stage 4 chronic kidney disease, or unspecified chronic kidney disease: Secondary | ICD-10-CM | POA: Diagnosis not present

## 2020-05-30 DIAGNOSIS — G40909 Epilepsy, unspecified, not intractable, without status epilepticus: Secondary | ICD-10-CM | POA: Diagnosis not present

## 2020-05-30 DIAGNOSIS — R479 Unspecified speech disturbances: Secondary | ICD-10-CM | POA: Diagnosis not present

## 2020-05-30 DIAGNOSIS — E1165 Type 2 diabetes mellitus with hyperglycemia: Secondary | ICD-10-CM | POA: Diagnosis not present

## 2020-05-30 DIAGNOSIS — K8060 Calculus of gallbladder and bile duct with cholecystitis, unspecified, without obstruction: Secondary | ICD-10-CM | POA: Diagnosis not present

## 2020-05-30 DIAGNOSIS — R569 Unspecified convulsions: Secondary | ICD-10-CM | POA: Diagnosis not present

## 2020-05-30 DIAGNOSIS — D631 Anemia in chronic kidney disease: Secondary | ICD-10-CM | POA: Diagnosis present

## 2020-05-30 DIAGNOSIS — I6389 Other cerebral infarction: Secondary | ICD-10-CM | POA: Diagnosis not present

## 2020-05-30 DIAGNOSIS — Z20822 Contact with and (suspected) exposure to covid-19: Secondary | ICD-10-CM | POA: Diagnosis not present

## 2020-05-30 DIAGNOSIS — N183 Chronic kidney disease, stage 3 unspecified: Secondary | ICD-10-CM | POA: Diagnosis not present

## 2020-05-30 LAB — CBC WITH DIFFERENTIAL/PLATELET
Abs Immature Granulocytes: 0.27 10*3/uL — ABNORMAL HIGH (ref 0.00–0.07)
Basophils Absolute: 0.1 10*3/uL (ref 0.0–0.1)
Basophils Relative: 0 %
Eosinophils Absolute: 0 10*3/uL (ref 0.0–0.5)
Eosinophils Relative: 0 %
HCT: 31.4 % — ABNORMAL LOW (ref 36.0–46.0)
Hemoglobin: 10.7 g/dL — ABNORMAL LOW (ref 12.0–15.0)
Immature Granulocytes: 1 %
Lymphocytes Relative: 3 %
Lymphs Abs: 0.8 10*3/uL (ref 0.7–4.0)
MCH: 32.6 pg (ref 26.0–34.0)
MCHC: 34.1 g/dL (ref 30.0–36.0)
MCV: 95.7 fL (ref 80.0–100.0)
Monocytes Absolute: 0.8 10*3/uL (ref 0.1–1.0)
Monocytes Relative: 3 %
Neutro Abs: 23.7 10*3/uL — ABNORMAL HIGH (ref 1.7–7.7)
Neutrophils Relative %: 93 %
Platelets: 147 10*3/uL — ABNORMAL LOW (ref 150–400)
RBC: 3.28 MIL/uL — ABNORMAL LOW (ref 3.87–5.11)
RDW: 12.3 % (ref 11.5–15.5)
Smear Review: NORMAL
WBC: 25.6 10*3/uL — ABNORMAL HIGH (ref 4.0–10.5)
nRBC: 0 % (ref 0.0–0.2)

## 2020-05-30 LAB — HEPATIC FUNCTION PANEL
ALT: 207 U/L — ABNORMAL HIGH (ref 0–44)
AST: 219 U/L — ABNORMAL HIGH (ref 15–41)
Albumin: 3.1 g/dL — ABNORMAL LOW (ref 3.5–5.0)
Alkaline Phosphatase: 270 U/L — ABNORMAL HIGH (ref 38–126)
Bilirubin, Direct: 2.2 mg/dL — ABNORMAL HIGH (ref 0.0–0.2)
Indirect Bilirubin: 1.5 mg/dL — ABNORMAL HIGH (ref 0.3–0.9)
Total Bilirubin: 3.7 mg/dL — ABNORMAL HIGH (ref 0.3–1.2)
Total Protein: 6.2 g/dL — ABNORMAL LOW (ref 6.5–8.1)

## 2020-05-30 LAB — BASIC METABOLIC PANEL
Anion gap: 13 (ref 5–15)
BUN: 34 mg/dL — ABNORMAL HIGH (ref 8–23)
CO2: 24 mmol/L (ref 22–32)
Calcium: 8.7 mg/dL — ABNORMAL LOW (ref 8.9–10.3)
Chloride: 103 mmol/L (ref 98–111)
Creatinine, Ser: 1.76 mg/dL — ABNORMAL HIGH (ref 0.44–1.00)
GFR, Estimated: 28 mL/min — ABNORMAL LOW (ref >60)
Glucose, Bld: 157 mg/dL — ABNORMAL HIGH (ref 70–99)
Potassium: 4.4 mmol/L (ref 3.5–5.1)
Sodium: 140 mmol/L (ref 135–145)

## 2020-05-30 LAB — LACTIC ACID, PLASMA
Lactic Acid, Venous: 2.2 mmol/L (ref 0.5–1.9)
Lactic Acid, Venous: 1.9 mmol/L (ref 0.5–1.9)
Lactic Acid, Venous: 2.4 mmol/L (ref 0.5–1.9)

## 2020-05-30 LAB — GLUCOSE, CAPILLARY: Glucose-Capillary: 158 mg/dL — ABNORMAL HIGH (ref 70–99)

## 2020-05-30 LAB — CBG MONITORING, ED: Glucose-Capillary: 131 mg/dL — ABNORMAL HIGH (ref 70–99)

## 2020-05-30 LAB — CK: Total CK: 75 U/L (ref 38–234)

## 2020-05-30 MED ORDER — POLYVINYL ALCOHOL 1.4 % OP SOLN
1.0000 [drp] | Freq: Three times a day (TID) | OPHTHALMIC | Status: DC | PRN
  Administered 2020-05-31 – 2020-06-01 (×2): 1 [drp] via OPHTHALMIC
  Filled 2020-05-30 (×2): qty 15

## 2020-05-30 MED ORDER — ENOXAPARIN SODIUM 30 MG/0.3ML ~~LOC~~ SOLN
30.0000 mg | SUBCUTANEOUS | Status: DC
  Filled 2020-05-30: qty 0.3

## 2020-05-30 MED ORDER — INSULIN GLARGINE 100 UNIT/ML ~~LOC~~ SOLN
8.0000 [IU] | Freq: Every day | SUBCUTANEOUS | Status: DC
  Administered 2020-05-30 – 2020-06-09 (×11): 8 [IU] via SUBCUTANEOUS
  Filled 2020-05-30 (×13): qty 0.08

## 2020-05-30 MED ORDER — PIPERACILLIN-TAZOBACTAM 3.375 G IVPB 30 MIN
3.3750 g | Freq: Once | INTRAVENOUS | Status: AC
  Administered 2020-05-30: 3.375 g via INTRAVENOUS
  Filled 2020-05-30: qty 50

## 2020-05-30 MED ORDER — METRONIDAZOLE 500 MG PO TABS
500.0000 mg | ORAL_TABLET | Freq: Three times a day (TID) | ORAL | Status: DC
  Administered 2020-05-30 (×2): 500 mg via ORAL
  Filled 2020-05-30 (×3): qty 1

## 2020-05-30 MED ORDER — ALBUTEROL SULFATE (2.5 MG/3ML) 0.083% IN NEBU: 2.5000 mg | INHALATION_SOLUTION | Freq: Four times a day (QID) | RESPIRATORY_TRACT | Status: DC | PRN

## 2020-05-30 MED ORDER — PIPERACILLIN-TAZOBACTAM 3.375 G IVPB: 3.3750 g | Freq: Three times a day (TID) | INTRAVENOUS | Status: DC

## 2020-05-30 MED ORDER — SODIUM CHLORIDE 0.9 % IV SOLN
2.0000 g | INTRAVENOUS | Status: DC
  Administered 2020-05-30 – 2020-06-05 (×7): 2 g via INTRAVENOUS
  Filled 2020-05-30 (×3): qty 20
  Filled 2020-05-30: qty 2
  Filled 2020-05-30 (×3): qty 20
  Filled 2020-05-30: qty 2
  Filled 2020-05-30: qty 20

## 2020-05-30 MED ORDER — SODIUM CHLORIDE 0.9% FLUSH
3.0000 mL | Freq: Two times a day (BID) | INTRAVENOUS | Status: DC
  Administered 2020-06-01 – 2020-06-10 (×17): 3 mL via INTRAVENOUS

## 2020-05-30 MED ORDER — SODIUM CHLORIDE 0.9 % IV SOLN: INTRAVENOUS | Status: DC

## 2020-05-30 MED ORDER — CARBOXYMETHYLCELLUL-GLYCERIN 1-0.25 % OP SOLN: 1.0000 [drp] | Freq: Three times a day (TID) | OPHTHALMIC | Status: DC | PRN

## 2020-05-30 MED ORDER — GADOBUTROL 1 MMOL/ML IV SOLN
4.0000 mL | Freq: Once | INTRAVENOUS | Status: AC | PRN
  Administered 2020-05-30: 7.5 mL via INTRAVENOUS

## 2020-05-30 MED ORDER — INSULIN GLARGINE 100 UNIT/ML SOLOSTAR PEN: 16.0000 [IU] | PEN_INJECTOR | Freq: Every day | SUBCUTANEOUS | Status: DC

## 2020-05-30 MED ORDER — LEVETIRACETAM 250 MG PO TABS
250.0000 mg | ORAL_TABLET | Freq: Two times a day (BID) | ORAL | Status: DC
  Administered 2020-05-30 – 2020-06-02 (×7): 250 mg via ORAL
  Filled 2020-05-30 (×10): qty 1

## 2020-05-30 MED ORDER — ASPIRIN EC 81 MG PO TBEC
81.0000 mg | DELAYED_RELEASE_TABLET | Freq: Every day | ORAL | Status: DC
  Administered 2020-05-30 – 2020-06-02 (×3): 81 mg via ORAL
  Filled 2020-05-30 (×4): qty 1

## 2020-05-30 MED ORDER — CARVEDILOL 12.5 MG PO TABS
12.5000 mg | ORAL_TABLET | Freq: Two times a day (BID) | ORAL | Status: DC
  Administered 2020-05-30 – 2020-06-10 (×21): 12.5 mg via ORAL
  Filled 2020-05-30 (×22): qty 1

## 2020-05-30 MED ORDER — ONDANSETRON HCL 4 MG PO TABS: 4.0000 mg | ORAL_TABLET | Freq: Four times a day (QID) | ORAL | Status: DC | PRN

## 2020-05-30 MED ORDER — ONDANSETRON HCL 4 MG/2ML IJ SOLN: 4.0000 mg | Freq: Four times a day (QID) | INTRAMUSCULAR | Status: DC | PRN

## 2020-05-30 MED ORDER — RAMIPRIL 5 MG PO CAPS
5.0000 mg | ORAL_CAPSULE | Freq: Every day | ORAL | Status: DC
  Administered 2020-05-30 – 2020-06-10 (×11): 5 mg via ORAL
  Filled 2020-05-30 (×13): qty 1

## 2020-05-30 MED ORDER — LACTATED RINGERS IV SOLN: INTRAVENOUS | Status: DC

## 2020-05-30 NOTE — Plan of Care (Signed)
  Problem: Nutrition Goal: Nutritional status is improving Description: Monitor and assess patient for malnutrition (ex- brittle hair, bruises, dry skin, pale skin and conjunctiva, muscle wasting, smooth red tongue, and disorientation). Collaborate with interdisciplinary team and initiate plan and interventions as ordered.  Monitor patient's weight and dietary intake as ordered or per policy. Utilize nutrition screening tool and intervene per policy. Determine patient's food preferences and provide high-protein, high-caloric foods as appropriate.  Outcome: Progressing

## 2020-05-30 NOTE — ED Notes (Addendum)
Per Dr. Creig Hines, pt should stay NPO until she is sent to [facility]. Explained this to pt and son. Both verbalized understanding. Pt ambulated to the restroom. Pt missed hat, unable to obtain urine sample.

## 2020-05-30 NOTE — Progress Notes (Signed)
On call md paged regarding CT of abd order. Still waiting for response.

## 2020-05-30 NOTE — Plan of Care (Signed)
  Problem: Education: Goal: Knowledge of General Education information will improve Description Including pain rating scale, medication(s)/side effects and non-pharmacologic comfort measures Outcome: Progressing   

## 2020-05-30 NOTE — H&P (Signed)
History and Physical    Taylor Castaneda YQI:[id number] DOB: 1932-06-21 DOA: 05/30/2020  Referring MD/NP/PA: Carlynn Purl, MD PCP: [name], MD  Patient coming from: Transfer from [facility]  Chief Complaint: Passed out I have personally briefly reviewed patient's old medical records in [place]   HPI: Taylor Castaneda is a 86 y.o. female with medical history significant of diabetes mellitus type 2, hyperlipidemia, seizure disorder, history colon cancer s/p resection, and anemia presented with complaints of passing out while at her condo.  The patient's daughter found her on the floor and was able to help her get to a chair.  However when EMS arrived patient had a witnessed generalized tonic-clonic seizure with urinary incontinence lasting approximately 1 minute prior to self resolving.  They did not have to give any abortive medications.  She had been compliant with her medications and last reported having a seizure over a year ago.  At baseline patient lives alone and handles all of her own medications.  Patient had not been feeling well earlier in the day with complaints of epigastric abdominal pain with radiation to her back.  Denied any recent trauma prior to onset of symptoms.  Symptoms lasted approximately 6 to 7 hours prior to self resolving.  She had taken Tums and Tylenol prior to coming into the hospital. CT scan of the head and cervical spine did not note any acute abnormalities.  CT imaging of the thoracic and lumbar spine noted severe spinal stenosis at L4-5 and large nonobstructing left renal stone.  Bowel sounds were noted to be stable.  Labs at the outside facility yesterday had been significant for WBC 17.8, hemoglobin 11.4, BUN 37, creatinine 1.4, alkaline phosphatase 283, AST 315, ALT 235, total bilirubin 3.3 and lactic acid initially 4->2.6.  COVID-19 and influenza screening was negative.  Right upper quadrant ultrasound revealed cholelithiasis without evidence of acute  cholecystitis and cirrhosis.  Case had been discussed with GI here at [facility] who recommended MRCP which revealed cholelithiasis without signs of acute cholecystitis, suspected small common bile duct stones distally with no duct dilatation, and pancreatic atrophy with dilatation in the mid body tail region with abrupt transition point concerning for possible stricture.  She had been given Keppra 1000 mg IV and 500 mL of normal saline IV fluids.  Patient had been accepted to the hospitalist service to a medical telemetry bed.  Repeat labs revealed WBC 25.6, hemoglobin 10.7, alkaline phosphatase 270, AST 219, ALT 207, and total bilirubin 3.7, lactic acid 2.2.   ED Course: As seen above.  Review of Systems  Constitutional: Negative for fever and malaise/fatigue.  HENT: Negative for ear pain and nosebleeds.   Eyes: Negative for photophobia and pain.  Cardiovascular: Negative for chest pain and leg swelling.  Gastrointestinal: Positive for abdominal pain. Negative for nausea and vomiting.  Genitourinary: Negative for dysuria.  Musculoskeletal: Positive for back pain and falls.  Neurological: Positive for seizures.    Past Medical History:  Diagnosis Date  . Anemia   . Diabetes mellitus, type II ([place])   . Glaucoma    Dr.Hecker  . History of colon cancer 1998   Dr. Lennie Hummer  . Hyperlipemia   . Osteopenia 02/2012, 03/2014   DEXA hip -2.2  . Renal insufficiency   . Seizures ([place])   . Stenosing tenosynovitis of finger of left hand 2016   index - s/p steroid injection x2    Past Surgical History:  Procedure Laterality Date  .  ABI  11/2013   WNL  . APPENDECTOMY    . CARPAL TUNNEL RELEASE     Bilateral   . CATARACT EXTRACTION Bilateral 2006   Implants  . COLECTOMY  1998   For cancer  . COLONOSCOPY  2008   Dr.Stark, [place] 2011  . COLONOSCOPY  06/2013   1 hyperplastic polyp, ileocecal anastomosis [facility])  . dexa  02/2012, 03/2014   T score -2.2 at hip overall stable  . REFRACTIVE  SURGERY    . TONSILLECTOMY AND ADENOIDECTOMY    . TOTAL KNEE ARTHROPLASTY Left 12/17/2016   Procedure: LEFT TOTAL KNEE ARTHROPLASTY;  Surgeon: Gaynelle Arabian, MD;  Location: [facility]S;  Service: Orthopedics;  Laterality: Left;     reports that she quit smoking about 47 years ago. Her smoking use included cigarettes. She quit after 2.00 years of use. She has never used smokeless tobacco. She reports previous alcohol use. She reports that she does not use drugs.  Allergies  Allergen Reactions  . Pravastatin Sodium     REACTION: anemia as per Dr Gilford Rile, [facility]    Family History  Problem Relation Age of Onset  . Heart attack Father 80  . Stroke Father 9  . Diabetes Father   . Hypertension Father   . Heart disease Father        before age 40  . Breast cancer Mother   . Cancer Mother   . Diabetes Sister   . Cancer Sister   . Peripheral vascular disease Sister   . Breast cancer Sister   . Hypertension Son   . COPD Neg Hx   . Asthma Neg Hx   . Colon cancer Neg Hx   . Esophageal cancer Neg Hx   . Rectal cancer Neg Hx   . Stomach cancer Neg Hx     Prior to Admission medications   Medication Sig Start Date End Date Taking? Authorizing Provider  aspirin 81 MG EC tablet Take 1 tablet (81 mg total) by mouth daily. Swallow whole. 03/07/19   Mariel Aloe, MD  atorvastatin (LIPITOR) 40 MG tablet Take 1 tablet (40 mg total) by mouth daily at 6 PM. 02/19/20   [name], MD  B-D ULTRAFINE III SHORT PEN 31G X 8 MM MISC USE AS DIRECTED WITH INSULIN 07/09/11   Hendricks Limes, MD  Biotin 10000 MCG TABS Take 1 tablet by mouth daily.    [provider]  Carboxymethylcellul-Glycerin 1-0.25 % SOLN Place 1-2 drops into both eyes 3 (three) times daily as needed (for dry/irritated eyes.).    [provider]  carvedilol (COREG) 12.5 MG tablet Take 1 tablet (12.5 mg total) by mouth 2 (two) times daily. 02/19/20   [name], MD  Cholecalciferol (VITAMIN D3) 25 MCG (1000  UT) CAPS Take 1 capsule (1,000 Units total) by mouth daily. Patient taking differently: Take 2,500 Units by mouth daily. 03/18/19   [name], MD  Cinnamon 500 MG capsule Take 1,000 mg by mouth daily.    [provider]  colchicine 0.6 MG tablet Take 1 tablet (0.6 mg total) by mouth daily as needed (gout flare). First day of gout flare, may take 1 tablet twice daily. 02/19/20   [name], MD  Ferrous Sulfate (IRON) 325 (65 Fe) MG TABS Take 1 tablet by mouth 2 (two) times a week. Takes Mon and [employer], Historical, MD  furosemide (LASIX) 20 MG tablet Take 1 tablet by mouth once daily 05/13/20   [name],  MD  HUMALOG KWIKPEN 100 UNIT/ML KiwkPen Inject 1-10 Units into the skin 3 (three) times daily before meals. Sliding scale 10/04/16   [provider]  LANTUS SOLOSTAR 100 UNIT/ML Solostar Pen Inject 16 Units into the skin at bedtime. 08/19/19   [name], MD  levETIRAcetam (KEPPRA) 250 MG tablet Take 1 tablet by mouth twice daily 03/25/20   Lomax, Amy, NP  Multiple Vitamins-Minerals (PRESERVISION AREDS 2 PO) Take by mouth 2 (two) times daily.    [provider]  potassium chloride (KLOR-CON) 10 MEQ tablet TAKE 1 TABLET BY MOUTH ON Adelfa Koh [facility] AND FRIDAY 04/20/20   [name], MD  ramipril (ALTACE) 5 MG capsule Take 1 capsule by mouth once daily 05/13/20   [name], MD    Physical Exam:  Constitutional: Elderly female currently in no acute distress Vitals:   05/30/20 1407  BP: (!) 141/53  Pulse: 71  Resp: 16  Temp: 99.1 F (37.3 C)  TempSrc: Oral  SpO2: 100%   Eyes: PERRL, lids and conjunctivae normal ENMT: Mucous membranes are dry. Posterior pharynx clear of any exudate or lesions.  Neck: normal, supple, no masses, no thyromegaly Respiratory: clear to auscultation bilaterally, no wheezing, no crackles. Normal respiratory effort. No accessory muscle use.  Cardiovascular: Regular rate and rhythm, no  murmurs / rubs / gallops. No extremity edema. 2+ pedal pulses. No carotid bruits.  Abdomen: no tenderness, no masses palpated. No hepatosplenomegaly. Bowel sounds positive.  Musculoskeletal: no clubbing / cyanosis. No joint deformity upper and lower extremities. Good ROM, no contractures. Normal muscle tone.  Skin: no rashes, lesions, ulcers. No induration Neurologic: CN 2-12 grossly intact. Sensation intact, DTR normal. Strength 5/5 in all 4.  Psychiatric: Normal judgment and insight. Alert and oriented x 3. Normal mood.     Labs on Admission: I have personally reviewed following labs and imaging studies  CBC: Recent Labs  Lab 05/29/20 1932 05/30/20 0139  WBC 17.8* 25.6*  NEUTROABS 16.3* 23.7*  HGB 11.4* 10.7*  HCT 35.0* 31.4*  MCV 98.3 95.7  PLT 156 643*   Basic Metabolic Panel: Recent Labs  Lab 05/29/20 1932  NA 139  K 4.3  CL 104  CO2 22  GLUCOSE 205*  BUN 37*  CREATININE 1.40*  CALCIUM 8.8*   GFR: Estimated Creatinine Clearance: 20 mL/min (A) (by C-G formula based on SCr of 1.4 mg/dL (H)). Liver Function Tests: Recent Labs  Lab 05/29/20 1932 05/30/20 0139  AST 315* 219*  ALT 235* 207*  ALKPHOS 282* 270*  BILITOT 3.3* 3.7*  PROT 6.3* 6.2*  ALBUMIN 3.2* 3.1*   Recent Labs  Lab 05/29/20 2230  LIPASE 25   No results for input(s): AMMONIA in the last 168 hours. Coagulation Profile: No results for input(s): INR, PROTIME in the last 168 hours. Cardiac Enzymes: No results for input(s): CKTOTAL, CKMB, CKMBINDEX, TROPONINI in the last 168 hours. BNP (last 3 results) No results for input(s): PROBNP in the last 8760 hours. HbA1C: No results for input(s): HGBA1C in the last 72 hours. CBG: Recent Labs  Lab 05/30/20 1306  GLUCAP 131*   Lipid Profile: No results for input(s): CHOL, HDL, LDLCALC, TRIG, CHOLHDL, LDLDIRECT in the last 72 hours. Thyroid Function Tests: No results for input(s): TSH, T4TOTAL, FREET4, T3FREE, THYROIDAB in the last 72  hours. Anemia Panel: No results for input(s): VITAMINB12, FOLATE, FERRITIN, TIBC, IRON, RETICCTPCT in the last 72 hours. Urine analysis:    Component Value Date/Time   COLORURINE YELLOW 03/06/2019 0050   APPEARANCEUR  CLEAR 03/06/2019 0050   LABSPEC >1.046 (H) 03/06/2019 0050   PHURINE 5.0 03/06/2019 0050   GLUCOSEU 50 (A) 03/06/2019 0050   HGBUR SMALL (A) 03/06/2019 0050   HGBUR large 06/08/2009 1435   BILIRUBINUR NEGATIVE 03/06/2019 0050   KETONESUR 20 (A) 03/06/2019 0050   PROTEINUR 30 (A) 03/06/2019 0050   UROBILINOGEN 0.2 03/08/2011 2011   NITRITE NEGATIVE 03/06/2019 0050   LEUKOCYTESUR NEGATIVE 03/06/2019 0050   Sepsis Labs: Recent Results (from the past 240 hour(s))  Resp Panel by RT-PCR (Flu A&B, Covid) Nasopharyngeal Swab     Status: None   Collection Time: 05/29/20  9:49 PM   Specimen: Nasopharyngeal Swab; Nasopharyngeal(NP) swabs in vial transport medium  Result Value Ref Range Status   SARS Coronavirus 2 by RT PCR NEGATIVE NEGATIVE Final    Comment: (NOTE) SARS-CoV-2 target nucleic acids are NOT DETECTED.  The SARS-CoV-2 RNA is generally detectable in upper respiratory specimens during the acute phase of infection. The lowest concentration of SARS-CoV-2 viral copies this assay can detect is 138 copies/mL. A negative result does not preclude SARS-Cov-2 infection and should not be used as the sole basis for treatment or other patient management decisions. A negative result may occur with  improper specimen collection/handling, submission of specimen other than nasopharyngeal swab, presence of viral mutation(s) within the areas targeted by this assay, and inadequate number of viral copies(<138 copies/mL). A negative result must be combined with clinical observations, patient history, and epidemiological information. The expected result is Negative.  Fact Sheet for Patients:  EntrepreneurPulse.com.au  Fact Sheet for Healthcare Providers:   IncredibleEmployment.be  This test is no t yet approved or cleared by the [place] FDA and  has been authorized for detection and/or diagnosis of SARS-CoV-2 by FDA under an Emergency Use Authorization (EUA). This EUA will remain  in effect (meaning this test can be used) for the duration of the COVID-19 declaration under Section 564(b)(1) of the Act, 21 U.S.C.section 360bbb-3(b)(1), unless the authorization is terminated  or revoked sooner.       Influenza A by PCR NEGATIVE NEGATIVE Final   Influenza B by PCR NEGATIVE NEGATIVE Final    Comment: (NOTE) The Xpert Xpress SARS-CoV-2/FLU/RSV plus assay is intended as an aid in the diagnosis of influenza from Nasopharyngeal swab specimens and should not be used as a sole basis for treatment. Nasal washings and aspirates are unacceptable for Xpert Xpress SARS-CoV-2/FLU/RSV testing.  Fact Sheet for Patients: EntrepreneurPulse.com.au  Fact Sheet for Healthcare Providers: IncredibleEmployment.be  This test is not yet approved or cleared by the [place] FDA and has been authorized for detection and/or diagnosis of SARS-CoV-2 by FDA under an Emergency Use Authorization (EUA). This EUA will remain in effect (meaning this test can be used) for the duration of the COVID-19 declaration under Section 564(b)(1) of the Act, 21 U.S.C. section 360bbb-3(b)(1), unless the authorization is terminated or revoked.  Performed at [facility], [address], [place], [place] [address]      Radiological Exams on Admission: CT Head Wo Contrast  Result Date: 05/29/2020 CLINICAL DATA:  Seizures.  Found on ground. EXAM: CT HEAD WITHOUT CONTRAST CT CERVICAL SPINE WITHOUT CONTRAST TECHNIQUE: Multidetector CT imaging of the head and cervical spine was performed following the standard protocol without intravenous contrast. Multiplanar CT image reconstructions of the cervical spine  were also generated. COMPARISON:  Head CT 03/05/2019, head and neck CTA 03/05/2019, and head MRI 03/06/2019 FINDINGS: CT HEAD FINDINGS Brain: There is no evidence of an  acute infarct, intracranial hemorrhage, mass, midline shift, or extra-axial fluid collection. Hypodensities in the cerebral white matter bilaterally are unchanged and nonspecific but compatible with mild chronic small vessel ischemic disease. A chronic lacunar infarct is again noted in the right basal ganglia. Moderately advanced cerebral atrophy is unchanged. Vascular: Calcified atherosclerosis at the skull base. No hyperdense vessel. Skull: No fracture or suspicious osseous lesion. Sinuses/Orbits: Visualized paranasal sinuses and mastoid air cells are clear. Bilateral cataract extraction. Other: None. CT CERVICAL SPINE FINDINGS Alignment: Cervical spine straightening. Trace anterolisthesis of C4 on C5. Skull base and vertebrae: No acute fracture or suspicious osseous lesion. Moderate median C1-2 arthropathy. Soft tissues and spinal canal: No prevertebral fluid or swelling. No visible canal hematoma. Disc levels: Severe disc space narrowing at C5-6 and C6-7 with uncovertebral spurring resulting in only mild bilateral neural foraminal stenosis. Mild cervical facet arthrosis, most notable on the left at C4-5. Upper chest: Clear lung apices. Other: Mild carotid atherosclerosis. IMPRESSION: 1. No evidence of acute intracranial abnormality. 2. Mild chronic small vessel ischemic disease and moderately advanced cerebral atrophy. 3. No acute cervical spine fracture. Electronically Signed   By: Logan Bores M.D.   On: 05/29/2020 20:13   CT Cervical Spine Wo Contrast  Result Date: 05/29/2020 CLINICAL DATA:  Seizures.  Found on ground. EXAM: CT HEAD WITHOUT CONTRAST CT CERVICAL SPINE WITHOUT CONTRAST TECHNIQUE: Multidetector CT imaging of the head and cervical spine was performed following the standard protocol without intravenous contrast. Multiplanar CT  image reconstructions of the cervical spine were also generated. COMPARISON:  Head CT 03/05/2019, head and neck CTA 03/05/2019, and head MRI 03/06/2019 FINDINGS: CT HEAD FINDINGS Brain: There is no evidence of an acute infarct, intracranial hemorrhage, mass, midline shift, or extra-axial fluid collection. Hypodensities in the cerebral white matter bilaterally are unchanged and nonspecific but compatible with mild chronic small vessel ischemic disease. A chronic lacunar infarct is again noted in the right basal ganglia. Moderately advanced cerebral atrophy is unchanged. Vascular: Calcified atherosclerosis at the skull base. No hyperdense vessel. Skull: No fracture or suspicious osseous lesion. Sinuses/Orbits: Visualized paranasal sinuses and mastoid air cells are clear. Bilateral cataract extraction. Other: None. CT CERVICAL SPINE FINDINGS Alignment: Cervical spine straightening. Trace anterolisthesis of C4 on C5. Skull base and vertebrae: No acute fracture or suspicious osseous lesion. Moderate median C1-2 arthropathy. Soft tissues and spinal canal: No prevertebral fluid or swelling. No visible canal hematoma. Disc levels: Severe disc space narrowing at C5-6 and C6-7 with uncovertebral spurring resulting in only mild bilateral neural foraminal stenosis. Mild cervical facet arthrosis, most notable on the left at C4-5. Upper chest: Clear lung apices. Other: Mild carotid atherosclerosis. IMPRESSION: 1. No evidence of acute intracranial abnormality. 2. Mild chronic small vessel ischemic disease and moderately advanced cerebral atrophy. 3. No acute cervical spine fracture. Electronically Signed   By: Logan Bores M.D.   On: 05/29/2020 20:13   CT Thoracic Spine Wo Contrast  Result Date: 05/29/2020 CLINICAL DATA:  Seizures.  Found on ground. EXAM: CT THORACIC AND LUMBAR SPINE WITHOUT CONTRAST TECHNIQUE: Multidetector CT imaging of the thoracic and lumbar spine was performed without contrast. Multiplanar CT image  reconstructions were also generated. COMPARISON:  CT abdomen and pelvis 06/13/2009 FINDINGS: CT THORACIC SPINE FINDINGS Alignment: Mild thoracic dextroscoliosis.  No listhesis. Vertebrae: No acute fracture or suspicious osseous lesion. Paraspinal and other soft tissues: Aortic and coronary atherosclerosis. Disc levels: Mild-to-moderate multilevel disc degeneration and moderate facet arthrosis in the thoracic spine. No evidence of high-grade spinal stenosis.  Moderate left neural foraminal stenosis at T11-12 due to prominent facet spurring. CT LUMBAR SPINE FINDINGS Segmentation: 5 lumbar type vertebrae. Alignment: Mild lumbar levoscoliosis.  No listhesis. Vertebrae: No acute fracture or suspicious osseous lesion. Paraspinal and other soft tissues: Nonobstructing 1.5 cm left renal stone, much larger than in 2011. Partially visualized left renal cyst measuring at least 3.8 cm. Abdominal aortic atherosclerosis without aneurysm. Disc levels: Mild disc space narrowing at L2-3 with severe narrowing at L4-5. Disc bulging and posterior element hypertrophy result in severe spinal stenosis at L4-5 and mild spinal stenosis at L3-4 greater than L2-3. There is moderate multilevel neural foraminal stenosis. IMPRESSION: 1. No acute osseous abnormality identified in the thoracic or lumbar spine. 2. Thoracic and lumbar disc and facet degeneration as above. Severe spinal stenosis at L4-5. 3. Large nonobstructing left renal stone. 4. Aortic Atherosclerosis (ICD10-I70.0). Electronically Signed   By: Logan Bores M.D.   On: 05/29/2020 20:53   CT Lumbar Spine Wo Contrast  Result Date: 05/29/2020 CLINICAL DATA:  Seizures.  Found on ground. EXAM: CT THORACIC AND LUMBAR SPINE WITHOUT CONTRAST TECHNIQUE: Multidetector CT imaging of the thoracic and lumbar spine was performed without contrast. Multiplanar CT image reconstructions were also generated. COMPARISON:  CT abdomen and pelvis 06/13/2009 FINDINGS: CT THORACIC SPINE FINDINGS  Alignment: Mild thoracic dextroscoliosis.  No listhesis. Vertebrae: No acute fracture or suspicious osseous lesion. Paraspinal and other soft tissues: Aortic and coronary atherosclerosis. Disc levels: Mild-to-moderate multilevel disc degeneration and moderate facet arthrosis in the thoracic spine. No evidence of high-grade spinal stenosis. Moderate left neural foraminal stenosis at T11-12 due to prominent facet spurring. CT LUMBAR SPINE FINDINGS Segmentation: 5 lumbar type vertebrae. Alignment: Mild lumbar levoscoliosis.  No listhesis. Vertebrae: No acute fracture or suspicious osseous lesion. Paraspinal and other soft tissues: Nonobstructing 1.5 cm left renal stone, much larger than in 2011. Partially visualized left renal cyst measuring at least 3.8 cm. Abdominal aortic atherosclerosis without aneurysm. Disc levels: Mild disc space narrowing at L2-3 with severe narrowing at L4-5. Disc bulging and posterior element hypertrophy result in severe spinal stenosis at L4-5 and mild spinal stenosis at L3-4 greater than L2-3. There is moderate multilevel neural foraminal stenosis. IMPRESSION: 1. No acute osseous abnormality identified in the thoracic or lumbar spine. 2. Thoracic and lumbar disc and facet degeneration as above. Severe spinal stenosis at L4-5. 3. Large nonobstructing left renal stone. 4. Aortic Atherosclerosis (ICD10-I70.0). Electronically Signed   By: Logan Bores M.D.   On: 05/29/2020 20:53   MR 3D Recon At Scanner  Result Date: 05/30/2020 CLINICAL DATA:  Right upper quadrant abdominal pain. Cholelithiasis. EXAM: MRI ABDOMEN WITHOUT AND WITH CONTRAST (INCLUDING MRCP) TECHNIQUE: Multiplanar multisequence MR imaging of the abdomen was performed both before and after the administration of intravenous contrast. Heavily T2-weighted images of the biliary and pancreatic ducts were obtained, and three-dimensional MRCP images were rendered by post processing. CONTRAST:  7.42mL GADAVIST GADOBUTROL 1 MMOL/ML IV  SOLN COMPARISON:  Ultrasound examination, same date. FINDINGS: Examination is limited due to respiratory motion. Lower chest: The lung bases are clear of an acute process. No pleural effusions or pericardial effusion. Hepatobiliary: No hepatic lesions are identified. No intrahepatic biliary dilatation. Layering dependent gallstones are noted in the gallbladder but no MR findings suspicious for acute cholecystitis. No common bile duct dilatation. Suspect small common bile duct stones distally. Low insertion of the cystic. Pancreas: Significant pancreatic atrophy. This is most notable in the distal body/tail regions there is virtually no pancreatic tissue. Pancreatic duct  is abruptly dilated at the midbody level no pancreatic mass is identified. This could be due to a ductal stricture. Spleen:  Normal size. No focal lesions. Adrenals/Urinary Tract:  The adrenal glands are unremarkable. There are simple renal cysts and a 10 mm left renal calculus. No worrisome renal lesions or hydronephrosis. Stomach/Bowel: The stomach, duodenum, visualized small bowel and visualized colon are grossly normal. Vascular/Lymphatic: Advanced atherosclerotic calcifications involving the aorta but aneurysm dissection. The branch vessels are patent. The major venous structures are patent. Other: No ascites or abdominal hernia. No obvious free air or free fluid. Musculoskeletal: No significant bony findings. IMPRESSION: 1. Examination is limited due to respiratory motion. 2. Cholelithiasis but no MR findings for acute cholecystitis. 3. Suspect small common bile duct stones distally. No common bile duct dilatation. 4. Significant pancreatic atrophy. There is also pancreatic duct dilatation in the mid body tail region with an abrupt transition but no obvious obstructing lesion. Possible pancreatic stricture. CT abdomen with pancreatic protocol may be helpful for further evaluation. 5. Simple renal cysts and a 10 mm left renal calculus.  Electronically Signed   By: Marijo Sanes M.D.   On: 05/30/2020 05:13   MR ABDOMEN MRCP W WO CONTAST  Result Date: 05/30/2020 CLINICAL DATA:  Right upper quadrant abdominal pain. Cholelithiasis. EXAM: MRI ABDOMEN WITHOUT AND WITH CONTRAST (INCLUDING MRCP) TECHNIQUE: Multiplanar multisequence MR imaging of the abdomen was performed both before and after the administration of intravenous contrast. Heavily T2-weighted images of the biliary and pancreatic ducts were obtained, and three-dimensional MRCP images were rendered by post processing. CONTRAST:  7.96mL GADAVIST GADOBUTROL 1 MMOL/ML IV SOLN COMPARISON:  Ultrasound examination, same date. FINDINGS: Examination is limited due to respiratory motion. Lower chest: The lung bases are clear of an acute process. No pleural effusions or pericardial effusion. Hepatobiliary: No hepatic lesions are identified. No intrahepatic biliary dilatation. Layering dependent gallstones are noted in the gallbladder but no MR findings suspicious for acute cholecystitis. No common bile duct dilatation. Suspect small common bile duct stones distally. Low insertion of the cystic. Pancreas: Significant pancreatic atrophy. This is most notable in the distal body/tail regions there is virtually no pancreatic tissue. Pancreatic duct is abruptly dilated at the midbody level no pancreatic mass is identified. This could be due to a ductal stricture. Spleen:  Normal size. No focal lesions. Adrenals/Urinary Tract:  The adrenal glands are unremarkable. There are simple renal cysts and a 10 mm left renal calculus. No worrisome renal lesions or hydronephrosis. Stomach/Bowel: The stomach, duodenum, visualized small bowel and visualized colon are grossly normal. Vascular/Lymphatic: Advanced atherosclerotic calcifications involving the aorta but aneurysm dissection. The branch vessels are patent. The major venous structures are patent. Other: No ascites or abdominal hernia. No obvious free air or free  fluid. Musculoskeletal: No significant bony findings. IMPRESSION: 1. Examination is limited due to respiratory motion. 2. Cholelithiasis but no MR findings for acute cholecystitis. 3. Suspect small common bile duct stones distally. No common bile duct dilatation. 4. Significant pancreatic atrophy. There is also pancreatic duct dilatation in the mid body tail region with an abrupt transition but no obvious obstructing lesion. Possible pancreatic stricture. CT abdomen with pancreatic protocol may be helpful for further evaluation. 5. Simple renal cysts and a 10 mm left renal calculus. Electronically Signed   By: Marijo Sanes M.D.   On: 05/30/2020 05:13   US Abdomen Limited RUQ (LIVER/GB)  Result Date: 05/29/2020 CLINICAL DATA:  86 year old female with right upper quadrant abdominal pain. EXAM: ULTRASOUND ABDOMEN  LIMITED RIGHT UPPER QUADRANT COMPARISON:  Right upper quadrant ultrasound dated 06/13/2009. FINDINGS: Gallbladder: There is sludge and small stones within the gallbladder. There is no gallbladder wall thickening or pericholecystic fluid. Negative sonographic Murphy sign. Common bile duct: Diameter: 5 mm Liver: Coarsened liver echotexture with slight surface nodularity consistent morphologic changes of cirrhosis. Portal vein is patent on color Doppler imaging with normal direction of blood flow towards the liver. Other: None. IMPRESSION: 1. Cholelithiasis without sonographic evidence of acute cholecystitis. 2. Cirrhosis. 3. Patent main portal vein with hepatopetal flow. Electronically Signed   By: Anner Crete M.D.   On: 05/29/2020 22:19    EKG: Independently reviewed.  Sinus rhythm at 73 beats per min  Assessment/Plan Choledocholithiasis: Acute.  Patient present with complaints of abdominal pain with radiation to her back.  Initial CT imaging studies significant for cholelithiasis without signs of cholecystitis and cirrhosis.  GI has been initially consulted and MRCP was recommended.  MRCP  revealed cholelithiasis without cholecystitis, small gallstones distally without clear duct dilatation, pancreatic duct dilatation in the mid body region with an abrupt transition, but no obvious obstructing lesion.  Patient transferred for need of ERCP -Admit to a medical telemetry -Check blood cultures -Carb modified diet and n.p.o. after -Start empiric antibiotics of Rocephin and metronidazole -[facility] consult, will follow-up for further recommendations  Pancreatic duct dilatated: As seen above.  Lipase was limits and no significant signs of inflammation noted on imaging. -CT of the abdomen with and without contrast -Follow-up CEA and CA 19-9 -Per GI  Leukocytosis and lactic acidosis: Acute.  WBC elevated from 17.8 to 25.6 with lactic acid initially elevated up to 4, but slowly trending down.  Patient vital signs were noted to be relatively within normal limits not necessarily meeting sepsis criteria. -Follow-up blood cultures -Check urinalysis and culture if necessary -Continue IV fluids -Trend lactic acid levels  Seizure disorder: Patient had witnessed generalized tonic-clonic seizure lasting 1 minute with continence of urine yesterday.  Suspect this could have been the reason patient had fallen on the floor as well.  Home medications include Keppra 250 mg twice daily. Patient had received 1000 mg IV yesterday. -Seizure precaution  -Continue home regimen of Keppra  Essential hypertension: Home blood pressure medications include Altace 5 mg daily, Coreg 12.5 mg twice daily, and furosemide 20 mg daily. -Hold furosemide and Altace -Continue Coreg  Acute kidney injury superimposed on chronic kidney disease IIIb: Creatinine was initially noted to be 1.4 (around patient's baseline), but repeat check today 1.76 with BUN 34. -Check CK -Continue normal saline IV fluids at 100 mL/h -Hold nephrotoxic agents -Recheck kidney function in a.m.  Normocytic anemia: Acute.  Hemoglobin 10.7 g/dL  on admission. -Continue to monitor  Diabetes mellitus type 2, uncontrolled: On admission glucose elevated up to 205.  Last hemoglobin A1c noted to be 8.7 on 02/12/2020.  Home medication includes Lantus 16 units nightly. -Hypoglycemic protocol -Check hemoglobin A1c -Continue half dose of Lantus -CBGs before every meal with moderate SSI   Elevated liver enzymes and hyperbilirubinemia: Acute.  Patient found to have AST 315->219, ALT 235->207, and total bilirubin 3.3->3.7. -Continue to monitor liver enzymes  Hyperlipidemia -Hold atorvastatin due to elevated liver enzymes  DVT prophylaxis: Lovenox  Code Status: Full  Family Communication: Daughter updated at bedside Disposition Plan: Likely discharge home in 2 to 3 days Consults called: GI Admission status: Inpatient, require more than 2 midnight stay  Norval Morton MD [facility]   If 7PM-7AM, please contact  night-coverage   05/30/2020, 2:13 PM

## 2020-05-30 NOTE — ED Notes (Signed)
Report given to care link for transport to [facility]. ETA 20 minutes

## 2020-05-30 NOTE — Consult Note (Signed)
[facility] Consult: 3:53 PM 05/30/2020  LOS: 0 days    Referring Provider: Dr Harvest Forest  Primary Care Physician:  [name], MD Primary Gastroenterologist:  Dr. Lucio Edward    Reason for Consultation: Elevated LFTs, cholelithiasis, cirrhosis   HPI: Taylor Castaneda is a 86 y.o. female.  PMH IDDM.  Hypertension.  Seizures. Remote colon cancer resected 1998. Most recent colonoscopy was in 06/2013, age 81.  He removed a 5 mm sessile polyp (hyperplastic) from the sigmoid and noted a healthy-appearing ileocolonic anastomosis as well as small nonbleeding internal hemorrhoids.  Dr. Fuller Plan did not suggest pursuing another surveillance colonoscopy due to patient's age.  Presented to [facility] and now transferred to [place]. Family found her on the ground today.  Suspect she'd had seizure as while  awaiting EMS to arrive she had a seizure. For several hours yesterday patient was experiencing pain in her right upper quadrant, epigastrium that radiated around to the ipsilateral scapula/shoulder.  There was no nausea or vomiting.  Once the pain subside it did not return.  No previous similar pain.  Appetite has been poor for over a year. CMET revealed T bili 3.7, alk phos 315.  AST/ALT 315/235.  Lipase 25. Elevated venous lactic acid is improving.  WBCs 25.6. Abdominal ultrasound shows worsened liver consistent with cirrhosis.  PV patent with normal directional flow.  Cholelithiasis but no cholecystitis.  5 mm CBD. MRI/MRCP: Study limited due to respiratory artifact.  Uncomplicated cholelithiasis.  Suspect small stone at distal CBD but duct is not dilated.  Pancreas atrophic.  Pancreatic duct dilated in the region of the mid body/tail with abrupt transition but no obvious obstructing lesion. ? Pancreatic stricture?Marland Kitchen   For further evaluation pancreatic protocol CT may provide better visualization.  Patient used to be obese in past years. No history of any alcohol intake.  No family history of liver disease.     Past Medical History:  Diagnosis Date  . Anemia   . Diabetes mellitus, type II ([place])   . Glaucoma    Dr.Hecker  . History of colon cancer 1998   Dr. Lennie Hummer  . Hyperlipemia   . Osteopenia 02/2012, 03/2014   DEXA hip -2.2  . Renal insufficiency   . Seizures ([place])   . Stenosing tenosynovitis of finger of left hand 2016   index - s/p steroid injection x2    Past Surgical History:  Procedure Laterality Date  . ABI  11/2013   WNL  . APPENDECTOMY    . CARPAL TUNNEL RELEASE     Bilateral   . CATARACT EXTRACTION Bilateral 2006   Implants  . COLECTOMY  1998   For cancer  . COLONOSCOPY  2008   Dr.Stark, Due 2011  . COLONOSCOPY  06/2013   1 hyperplastic polyp, ileocecal anastomosis Fuller Plan)  . dexa  02/2012, 03/2014   T score -2.2 at hip overall stable  . REFRACTIVE SURGERY    . TONSILLECTOMY AND ADENOIDECTOMY    . TOTAL KNEE ARTHROPLASTY Left 12/17/2016   Procedure: LEFT TOTAL KNEE ARTHROPLASTY;  Surgeon: Gaynelle Arabian, MD;  Location: [facility];  Service: Orthopedics;  Laterality: Left;    Prior to Admission medications   Medication Sig Start Date End Date Taking? Authorizing Provider  acetaminophen (TYLENOL) 325 MG tablet Take 325-650 mg by mouth every 6 (six) hours as needed (for pain).   Yes [provider]  aspirin 81 MG EC tablet Take 1 tablet (81 mg total) by mouth daily. Swallow whole. 03/07/19  Yes Mariel Aloe, MD  atorvastatin (LIPITOR) 40 MG tablet Take 1 tablet (40 mg total) by mouth daily at 6 PM. 02/19/20  Yes [name], MD  Biotin 10000 MCG TABS Take 10,000 mcg by mouth daily.   Yes [provider]  Carboxymethylcellul-Glycerin 1-0.25 % SOLN Place 1-2 drops into both eyes 3 (three) times daily as needed (for dry/irritated eyes.).   Yes [provider]  carvedilol (COREG) 12.5 MG tablet Take 1 tablet (12.5 mg total) by mouth 2 (two) times daily. 02/19/20  Yes [name], MD  Cholecalciferol (VITAMIN D3) 25 MCG (1000 UT) CAPS Take 1 capsule (1,000 Units total) by mouth daily. Patient taking differently: Take 2,000 Units by mouth daily. 03/18/19  Yes [name], MD  Cinnamon 500 MG capsule Take 1,000 mg by mouth daily.   Yes [provider]  colchicine 0.6 MG tablet Take 1 tablet (0.6 mg total) by mouth daily as needed (gout flare). First day of gout flare, may take 1 tablet twice daily. Patient taking differently: Take 0.6 mg by mouth daily as needed (for gout flares, and may take 0.6 mg TWICE DAILY on 1st day of flare). 02/19/20  Yes [name], MD  Ferrous Sulfate (IRON) 325 (65 Fe) MG TABS Take 325 mg by mouth See admin instructions. Take 325 mg by mouth in the evening on Mondays and Thursdays only   Yes [provider]  furosemide (LASIX) 20 MG tablet Take 1 tablet by mouth once daily Patient taking differently: Take 20 mg by mouth in the morning. 05/13/20  Yes [name], MD  HUMALOG KWIKPEN 100 UNIT/ML KiwkPen Inject 1-10 Units into the skin See admin instructions. Inject 1-10 units into the skin three times a day with meals, PER SLIDING SCALE 10/04/16  Yes [provider]  LANTUS SOLOSTAR 100 UNIT/ML Solostar Pen Inject 16 Units into the skin at bedtime. 08/19/19  Yes [name], MD  levETIRAcetam (KEPPRA) 250 MG tablet Take 1 tablet by mouth twice daily Patient taking differently: Take 250 mg by mouth 2 (two) times daily. 03/25/20  Yes Lomax, Amy, NP  Multiple Vitamins-Minerals (PRESERVISION AREDS 2 PO) Take 1 capsule by mouth 2 (two) times daily.   Yes [provider]  potassium chloride (KLOR-CON) 10 MEQ tablet TAKE 1 TABLET BY MOUTH ON MONDAY, [place] Patient taking differently: Take 10 mEq by mouth every Monday, Wednesday, and Friday. 04/20/20   Yes [name], MD  ramipril (ALTACE) 5 MG capsule Take 1 capsule by mouth once daily Patient taking differently: Take 5 mg by mouth daily. 05/13/20  Yes [name], MD  B-D ULTRAFINE III SHORT PEN 31G X 8 MM MISC USE AS DIRECTED WITH INSULIN 07/09/11   Hendricks Limes, MD    Scheduled Meds: . enoxaparin (LOVENOX) injection  30 mg Subcutaneous Q24H  . metroNIDAZOLE  500 mg Oral Q8H  . sodium chloride flush  3 mL Intravenous Q12H   Infusions: . sodium chloride 100 mL/hr at 05/30/20 1545  . cefTRIAXone (ROCEPHIN)  IV  PRN Meds: albuterol, ondansetron **OR** ondansetron (ZOFRAN) IV   Allergies as of 05/30/2020 - Review Complete 05/30/2020  Allergen Reaction Noted  . Gabapentin Other (See Comments) 04/13/2020  . Pravastatin sodium Other (See Comments)     Family History  Problem Relation Age of Onset  . Heart attack Father 6  . Stroke Father 65  . Diabetes Father   . Hypertension Father   . Heart disease Father        before age 46  . Breast cancer Mother   . Cancer Mother   . Diabetes Sister   . Cancer Sister   . Peripheral vascular disease Sister   . Breast cancer Sister   . Hypertension Son   . COPD Neg Hx   . Asthma Neg Hx   . Colon cancer Neg Hx   . Esophageal cancer Neg Hx   . Rectal cancer Neg Hx   . Stomach cancer Neg Hx     Social History   Socioeconomic History  . Marital status: Widowed    Spouse name: Not on file  . Number of children: 4  . Years of education: Not on file  . Highest education level: High school graduate  Occupational History  . Occupation: retired    Fish farm manager: Retired    Comment: Regulatory affairs officer  Tobacco Use  . Smoking status: Former Smoker    Years: 2.00    Types: Cigarettes    Quit date: 06/04/1973    Years since quitting: 47.0  . Smokeless tobacco: Never Used  . Tobacco comment: Smoked for 5 years 1965-1970 up to 1/2 pp week  Vaping Use  . Vaping Use: Never used  Substance and Sexual Activity  . Alcohol use:  Not Currently  . Drug use: No  . Sexual activity: Never  Other Topics Concern  . Not on file  Social History Narrative   03/10/19 Lives alone, no pets   Neighbors keep an eye on each other - 12 unit condo.    Family nearby.   Occ: retired Regulatory affairs officer   Activity: walking   Diet: good water, fruits/vegetables daily   Caffeine- some tea, occas soda   Social Determinants of Radio broadcast assistant Strain: Not on file  Food Insecurity: Not on file  Transportation Needs: Not on file  Physical Activity: Not on file  Stress: Not on file  Social Connections: Not on file  Intimate Partner Violence: Not on file    REVIEW OF SYSTEMS: Constitutional: No fatigue, malaise or weakness. ENT:  No nose bleeds Pulm: Cough or shortness of breath CV:  No palpitations, no LE edema.  No angina GU:  No hematuria, no frequency.  No dark-colored urine GI: See HPI Heme: [place] unusual or excessive bleeding or bruising Transfusions: None Neuro: Agents altered sensorium has resolved and she is able to provide history and answer all questions.  No headaches, no peripheral tingling or numbness Derm:  No itching, no rash or sores.  Endocrine:  No sweats or chills.  No polyuria or dysuria Immunization: Vaccinated x3 for COVID-19. Travel:  None beyond local counties in last few months.    PHYSICAL EXAM: Vital signs in last 24 hours: Vitals:   05/30/20 1407  BP: (!) 141/53  Pulse: 71  Resp: 16  Temp: 99.1 F (37.3 C)  SpO2: 100%   Wt Readings from Last 3 Encounters:  05/29/20 50.8 kg  02/19/20 51.8 kg  09/08/19 51.6 kg    General: Pleasant, looks younger than stated age.  Comfortable and not ill-looking Head: No facial asymmetry, no signs of head trauma. Eyes: No scleral icterus or conjunctival pallor Ears: No hearing deficit Nose: No congestion or discharge Mouth: Dentures in place.  Mucosa is pink, moist, clear.  Tongue is midline. Neck: No JVD, masses, thyromegaly Lungs: Clear  bilaterally with good breath sounds.  No cough or dyspnea Heart: RRR.  No MRG.  S1, S2 present Abdomen: Soft.  Minimal tenderness in the right upper quadrant.  Bowel sounds are active.  No distention.  No masses, HSM, bruits, hernias.   Rectal: Deferred Musc/Skeltl: No signs of trauma to the limbs or trunk Extremities: No CCE Neurologic:.  Oriented x3.  Alert.  Fluid speech.  Moves all 4 limbs without tremor, strength not tested Skin: No jaundice.  No telangiectasia. Nodes: No cervical adenopathy Psych: Calm, cooperative, pleasant.  Intake/Output from previous day: No intake/output data recorded. Intake/Output this shift: No intake/output data recorded.  LAB RESULTS: Recent Labs    05/29/20 1932 05/30/20 0139  WBC 17.8* 25.6*  HGB 11.4* 10.7*  HCT 35.0* 31.4*  PLT 156 147*   BMET Lab Results  Component Value Date   NA 139 05/29/2020   NA 144 02/12/2020   NA 138 03/06/2019   K 4.3 05/29/2020   K 4.5 02/12/2020   K 4.3 03/06/2019   CL 104 05/29/2020   CL 108 02/12/2020   CL 105 03/06/2019   CO2 22 05/29/2020   CO2 28 02/12/2020   CO2 20 (L) 03/06/2019   GLUCOSE 205 (H) 05/29/2020   GLUCOSE 158 (H) 02/12/2020   GLUCOSE 166 (H) 03/06/2019   BUN 37 (H) 05/29/2020   BUN 47 (H) 02/12/2020   BUN 29 (H) 03/06/2019   CREATININE 1.40 (H) 05/29/2020   CREATININE 1.41 (H) 02/12/2020   CREATININE 1.4 (A) 04/06/2019   CALCIUM 8.8 (L) 05/29/2020   CALCIUM 9.5 02/12/2020   CALCIUM 8.8 (L) 03/06/2019   LFT Recent Labs    05/29/20 1932 05/30/20 0139  PROT 6.3* 6.2*  ALBUMIN 3.2* 3.1*  AST 315* 219*  ALT 235* 207*  ALKPHOS 282* 270*  BILITOT 3.3* 3.7*  BILIDIR  --  2.2*  IBILI  --  1.5*   PT/INR Lab Results  Component Value Date   INR 1.1 03/05/2019   INR 1.07 12/11/2016   INR 1.1 (H) 11/20/2016   Hepatitis Panel No results for input(s): HEPBSAG, HCVAB, HEPAIGM, HEPBIGM in the last 72 hours. C-Diff No components found for: CDIFF Lipase     Component Value  Date/Time   LIPASE 25 05/29/2020 2230    Drugs of Abuse     Component Value Date/Time   LABOPIA NONE DETECTED 03/06/2019 0050   COCAINSCRNUR NONE DETECTED 03/06/2019 0050   LABBENZ NONE DETECTED 03/06/2019 0050   AMPHETMU NONE DETECTED 03/06/2019 0050   THCU NONE DETECTED 03/06/2019 0050   LABBARB NONE DETECTED 03/06/2019 0050     RADIOLOGY STUDIES: CT Head Wo Contrast  Result Date: 05/29/2020 CLINICAL DATA:  Seizures.  Found on ground. EXAM: CT HEAD WITHOUT CONTRAST CT CERVICAL SPINE WITHOUT CONTRAST TECHNIQUE: Multidetector CT imaging of the head and cervical spine was performed following the standard protocol without intravenous contrast. Multiplanar CT image reconstructions of the cervical spine were also generated. COMPARISON:  Head CT 03/05/2019, head and neck CTA 03/05/2019, and head MRI 03/06/2019 FINDINGS: CT HEAD FINDINGS Brain: There is no evidence of an acute infarct, intracranial hemorrhage, mass, midline shift, or extra-axial fluid collection. Hypodensities in the cerebral white matter bilaterally  are unchanged and nonspecific but compatible with mild chronic small vessel ischemic disease. A chronic lacunar infarct is again noted in the right basal ganglia. Moderately advanced cerebral atrophy is unchanged. Vascular: Calcified atherosclerosis at the skull base. No hyperdense vessel. Skull: No fracture or suspicious osseous lesion. Sinuses/Orbits: Visualized paranasal sinuses and mastoid air cells are clear. Bilateral cataract extraction. Other: None. CT CERVICAL SPINE FINDINGS Alignment: Cervical spine straightening. Trace anterolisthesis of C4 on C5. Skull base and vertebrae: No acute fracture or suspicious osseous lesion. Moderate median C1-2 arthropathy. Soft tissues and spinal canal: No prevertebral fluid or swelling. No visible canal hematoma. Disc levels: Severe disc space narrowing at C5-6 and C6-7 with uncovertebral spurring resulting in only mild bilateral neural foraminal  stenosis. Mild cervical facet arthrosis, most notable on the left at C4-5. Upper chest: Clear lung apices. Other: Mild carotid atherosclerosis. IMPRESSION: 1. No evidence of acute intracranial abnormality. 2. Mild chronic small vessel ischemic disease and moderately advanced cerebral atrophy. 3. No acute cervical spine fracture. Electronically Signed   By: Logan Bores M.D.   On: 05/29/2020 20:13   CT Cervical Spine Wo Contrast  Result Date: 05/29/2020 CLINICAL DATA:  Seizures.  Found on ground. EXAM: CT HEAD WITHOUT CONTRAST CT CERVICAL SPINE WITHOUT CONTRAST TECHNIQUE: Multidetector CT imaging of the head and cervical spine was performed following the standard protocol without intravenous contrast. Multiplanar CT image reconstructions of the cervical spine were also generated. COMPARISON:  Head CT 03/05/2019, head and neck CTA 03/05/2019, and head MRI 03/06/2019 FINDINGS: CT HEAD FINDINGS Brain: There is no evidence of an acute infarct, intracranial hemorrhage, mass, midline shift, or extra-axial fluid collection. Hypodensities in the cerebral white matter bilaterally are unchanged and nonspecific but compatible with mild chronic small vessel ischemic disease. A chronic lacunar infarct is again noted in the right basal ganglia. Moderately advanced cerebral atrophy is unchanged. Vascular: Calcified atherosclerosis at the skull base. No hyperdense vessel. Skull: No fracture or suspicious osseous lesion. Sinuses/Orbits: Visualized paranasal sinuses and mastoid air cells are clear. Bilateral cataract extraction. Other: None. CT CERVICAL SPINE FINDINGS Alignment: Cervical spine straightening. Trace anterolisthesis of C4 on C5. Skull base and vertebrae: No acute fracture or suspicious osseous lesion. Moderate median C1-2 arthropathy. Soft tissues and spinal canal: No prevertebral fluid or swelling. No visible canal hematoma. Disc levels: Severe disc space narrowing at C5-6 and C6-7 with uncovertebral spurring  resulting in only mild bilateral neural foraminal stenosis. Mild cervical facet arthrosis, most notable on the left at C4-5. Upper chest: Clear lung apices. Other: Mild carotid atherosclerosis. IMPRESSION: 1. No evidence of acute intracranial abnormality. 2. Mild chronic small vessel ischemic disease and moderately advanced cerebral atrophy. 3. No acute cervical spine fracture. Electronically Signed   By: Logan Bores M.D.   On: 05/29/2020 20:13   CT Thoracic Spine Wo Contrast  Result Date: 05/29/2020 CLINICAL DATA:  Seizures.  Found on ground. EXAM: CT THORACIC AND LUMBAR SPINE WITHOUT CONTRAST TECHNIQUE: Multidetector CT imaging of the thoracic and lumbar spine was performed without contrast. Multiplanar CT image reconstructions were also generated. COMPARISON:  CT abdomen and pelvis 06/13/2009 FINDINGS: CT THORACIC SPINE FINDINGS Alignment: Mild thoracic dextroscoliosis.  No listhesis. Vertebrae: No acute fracture or suspicious osseous lesion. Paraspinal and other soft tissues: Aortic and coronary atherosclerosis. Disc levels: Mild-to-moderate multilevel disc degeneration and moderate facet arthrosis in the thoracic spine. No evidence of high-grade spinal stenosis. Moderate left neural foraminal stenosis at T11-12 due to prominent facet spurring. CT LUMBAR SPINE FINDINGS Segmentation: 5  lumbar type vertebrae. Alignment: Mild lumbar levoscoliosis.  No listhesis. Vertebrae: No acute fracture or suspicious osseous lesion. Paraspinal and other soft tissues: Nonobstructing 1.5 cm left renal stone, much larger than in 2011. Partially visualized left renal cyst measuring at least 3.8 cm. Abdominal aortic atherosclerosis without aneurysm. Disc levels: Mild disc space narrowing at L2-3 with severe narrowing at L4-5. Disc bulging and posterior element hypertrophy result in severe spinal stenosis at L4-5 and mild spinal stenosis at L3-4 greater than L2-3. There is moderate multilevel neural foraminal stenosis.  IMPRESSION: 1. No acute osseous abnormality identified in the thoracic or lumbar spine. 2. Thoracic and lumbar disc and facet degeneration as above. Severe spinal stenosis at L4-5. 3. Large nonobstructing left renal stone. 4. Aortic Atherosclerosis (ICD10-I70.0). Electronically Signed   By: Logan Bores M.D.   On: 05/29/2020 20:53   CT Lumbar Spine Wo Contrast  Result Date: 05/29/2020 CLINICAL DATA:  Seizures.  Found on ground. EXAM: CT THORACIC AND LUMBAR SPINE WITHOUT CONTRAST TECHNIQUE: Multidetector CT imaging of the thoracic and lumbar spine was performed without contrast. Multiplanar CT image reconstructions were also generated. COMPARISON:  CT abdomen and pelvis 06/13/2009 FINDINGS: CT THORACIC SPINE FINDINGS Alignment: Mild thoracic dextroscoliosis.  No listhesis. Vertebrae: No acute fracture or suspicious osseous lesion. Paraspinal and other soft tissues: Aortic and coronary atherosclerosis. Disc levels: Mild-to-moderate multilevel disc degeneration and moderate facet arthrosis in the thoracic spine. No evidence of high-grade spinal stenosis. Moderate left neural foraminal stenosis at T11-12 due to prominent facet spurring. CT LUMBAR SPINE FINDINGS Segmentation: 5 lumbar type vertebrae. Alignment: Mild lumbar levoscoliosis.  No listhesis. Vertebrae: No acute fracture or suspicious osseous lesion. Paraspinal and other soft tissues: Nonobstructing 1.5 cm left renal stone, much larger than in 2011. Partially visualized left renal cyst measuring at least 3.8 cm. Abdominal aortic atherosclerosis without aneurysm. Disc levels: Mild disc space narrowing at L2-3 with severe narrowing at L4-5. Disc bulging and posterior element hypertrophy result in severe spinal stenosis at L4-5 and mild spinal stenosis at L3-4 greater than L2-3. There is moderate multilevel neural foraminal stenosis. IMPRESSION: 1. No acute osseous abnormality identified in the thoracic or lumbar spine. 2. Thoracic and lumbar disc and facet  degeneration as above. Severe spinal stenosis at L4-5. 3. Large nonobstructing left renal stone. 4. Aortic Atherosclerosis (ICD10-I70.0). Electronically Signed   By: Logan Bores M.D.   On: 05/29/2020 20:53   MR 3D Recon At Scanner  Result Date: 05/30/2020 CLINICAL DATA:  Right upper quadrant abdominal pain. Cholelithiasis. EXAM: MRI ABDOMEN WITHOUT AND WITH CONTRAST (INCLUDING MRCP) TECHNIQUE: Multiplanar multisequence MR imaging of the abdomen was performed both before and after the administration of intravenous contrast. Heavily T2-weighted images of the biliary and pancreatic ducts were obtained, and three-dimensional MRCP images were rendered by post processing. CONTRAST:  7.37m GADAVIST GADOBUTROL 1 MMOL/ML IV SOLN COMPARISON:  Ultrasound examination, same date. FINDINGS: Examination is limited due to respiratory motion. Lower chest: The lung bases are clear of an acute process. No pleural effusions or pericardial effusion. Hepatobiliary: No hepatic lesions are identified. No intrahepatic biliary dilatation. Layering dependent gallstones are noted in the gallbladder but no MR findings suspicious for acute cholecystitis. No common bile duct dilatation. Suspect small common bile duct stones distally. Low insertion of the cystic. Pancreas: Significant pancreatic atrophy. This is most notable in the distal body/tail regions there is virtually no pancreatic tissue. Pancreatic duct is abruptly dilated at the midbody level no pancreatic mass is identified. This could be due to a  ductal stricture. Spleen:  Normal size. No focal lesions. Adrenals/Urinary Tract:  The adrenal glands are unremarkable. There are simple renal cysts and a 10 mm left renal calculus. No worrisome renal lesions or hydronephrosis. Stomach/Bowel: The stomach, duodenum, visualized small bowel and visualized colon are grossly normal. Vascular/Lymphatic: Advanced atherosclerotic calcifications involving the aorta but aneurysm dissection. The  branch vessels are patent. The major venous structures are patent. Other: No ascites or abdominal hernia. No obvious free air or free fluid. Musculoskeletal: No significant bony findings. IMPRESSION: 1. Examination is limited due to respiratory motion. 2. Cholelithiasis but no MR findings for acute cholecystitis. 3. Suspect small common bile duct stones distally. No common bile duct dilatation. 4. Significant pancreatic atrophy. There is also pancreatic duct dilatation in the mid body tail region with an abrupt transition but no obvious obstructing lesion. Possible pancreatic stricture. CT abdomen with pancreatic protocol may be helpful for further evaluation. 5. Simple renal cysts and a 10 mm left renal calculus. Electronically Signed   By: Marijo Sanes M.D.   On: 05/30/2020 05:13   MR ABDOMEN MRCP W WO CONTAST  Result Date: 05/30/2020 CLINICAL DATA:  Right upper quadrant abdominal pain. Cholelithiasis. EXAM: MRI ABDOMEN WITHOUT AND WITH CONTRAST (INCLUDING MRCP) TECHNIQUE: Multiplanar multisequence MR imaging of the abdomen was performed both before and after the administration of intravenous contrast. Heavily T2-weighted images of the biliary and pancreatic ducts were obtained, and three-dimensional MRCP images were rendered by post processing. CONTRAST:  7.74m GADAVIST GADOBUTROL 1 MMOL/ML IV SOLN COMPARISON:  Ultrasound examination, same date. FINDINGS: Examination is limited due to respiratory motion. Lower chest: The lung bases are clear of an acute process. No pleural effusions or pericardial effusion. Hepatobiliary: No hepatic lesions are identified. No intrahepatic biliary dilatation. Layering dependent gallstones are noted in the gallbladder but no MR findings suspicious for acute cholecystitis. No common bile duct dilatation. Suspect small common bile duct stones distally. Low insertion of the cystic. Pancreas: Significant pancreatic atrophy. This is most notable in the distal body/tail regions  there is virtually no pancreatic tissue. Pancreatic duct is abruptly dilated at the midbody level no pancreatic mass is identified. This could be due to a ductal stricture. Spleen:  Normal size. No focal lesions. Adrenals/Urinary Tract:  The adrenal glands are unremarkable. There are simple renal cysts and a 10 mm left renal calculus. No worrisome renal lesions or hydronephrosis. Stomach/Bowel: The stomach, duodenum, visualized small bowel and visualized colon are grossly normal. Vascular/Lymphatic: Advanced atherosclerotic calcifications involving the aorta but aneurysm dissection. The branch vessels are patent. The major venous structures are patent. Other: No ascites or abdominal hernia. No obvious free air or free fluid. Musculoskeletal: No significant bony findings. IMPRESSION: 1. Examination is limited due to respiratory motion. 2. Cholelithiasis but no MR findings for acute cholecystitis. 3. Suspect small common bile duct stones distally. No common bile duct dilatation. 4. Significant pancreatic atrophy. There is also pancreatic duct dilatation in the mid body tail region with an abrupt transition but no obvious obstructing lesion. Possible pancreatic stricture. CT abdomen with pancreatic protocol may be helpful for further evaluation. 5. Simple renal cysts and a 10 mm left renal calculus. Electronically Signed   By: PMarijo SanesM.D.   On: 05/30/2020 05:13   [place]dom[place] Limited RUQ (LIVER/GB)  Result Date: 05/29/2020 CLINICAL DATA:  86year old female with right upper quadrant abdominal pain. EXAM: ULTRASOUND ABDOMEN LIMITED RIGHT UPPER QUADRANT COMPARISON:  Right upper quadrant ultrasound dated 06/13/2009. FINDINGS: Gallbladder: There is sludge and  small stones within the gallbladder. There is no gallbladder wall thickening or pericholecystic fluid. Negative sonographic Murphy sign. Common bile duct: Diameter: 5 mm Liver: Coarsened liver echotexture with slight surface nodularity consistent morphologic  changes of cirrhosis. Portal vein is patent on color Doppler imaging with normal direction of blood flow towards the liver. Other: None. IMPRESSION: 1. Cholelithiasis without sonographic evidence of acute cholecystitis. 2. Cirrhosis. 3. Patent main portal vein with hepatopetal flow. Electronically Signed   By: Anner Crete M.D.   On: 05/29/2020 22:19      IMPRESSION:   *   Acute seizure activity in pt on antiepileptic meds.  *    Elevated LFTs.  Several hours of right upper quadrant/right shoulder pain yesterday.  Between the ultrasound and MRCP there is possible small stone in the distal CBD, dilated PD w possible pancreatic stricture, cirrhotic appearing liver.  *   Remote colon cancer.  At most recent colonoscopy in 2015 Dr. Rockney Ghee dark removed a small hyperplastic polyp.  He does not recommend further cancer surveillance colonoscopies given her age.    *   New diagnosis of cirrhosis of the liver.  Most likely this is evolution of fatty liver but on ultrasound of 2011 and CT scan of 2011 the liver was not fatty.  *   Normocytic anemia.  Hb 10.7.  Baseline looks to be about 11.5 to 14 as of 03/2019 labs.  Takes chronic iron twice weekly on Mondays, Thursdays.  *    IDDM.    PLAN:     *   Per Dr Lyndel Safe.  ERCP is set up for tomorrow around 1 PM. I discontinued orders for the Lovenox, replaced with order for PAS hose.  *   Dr. Tamala Julian has already ordered oral Flagyl and daily Rocephin so will not add additional pre-ERCP antibiotics.  *    As there is no nausea I am going to let her have carb modified diet tonight and n.p.o. after midnight.    *   CA 19-9. CEA level  *   Pancreatic protocol CT.     Azucena Freed  05/30/2020, 3:53 PM Phone [phone]   Attending physician's note   I have taken an interval history, reviewed the chart and examined the patient. I agree with the Advanced Practitioner's note, impression and recommendations.   Choledocholithiasis with ?mild asc  cholangitis and abn LFTs. Pt with cholelithiasis Mid PD stricture with upstream dilatation ?etiology Benign vs malignant. H/O wt loss (38lb over 1 year) is concerning ?Early cirrhosis on [place] without portal HTN (incidental finding) H/O recent SZ Remote H/O colon ca s/p R hemicolectomy 1988. Last colon 06/2013- neg  Plan: -IV ABx -MRCP reviewed. There is resp artifact. However, 3D recon images are good. -Proceed with CT pancreatic protocol as suggested by radiology. -ERCP in AM.  Discussed risks and benefits with pt and daughter including small but definite risks of pancreatitis, bleeding, perforation, risks of anesthesia.  Also try to evaluate PD if possible. -Check CA 19-9, CEA, IgG4 -Trend CBC, LFTs    Carmell Austria, MD [facility] [phone]

## 2020-05-30 NOTE — H&P (View-Only) (Signed)
[facility] Consult: 3:53 PM 05/30/2020  LOS: 0 days    Referring Provider: Dr Harvest Forest  Primary Care Physician:  [name], MD Primary Gastroenterologist:  Dr. Lucio Edward    Reason for Consultation: Elevated LFTs, cholelithiasis, cirrhosis   HPI: Taylor Castaneda is a 86 y.o. female.  PMH IDDM.  Hypertension.  Seizures. Remote colon cancer resected 1998. Most recent colonoscopy was in 06/2013, age 54.  He removed a 5 mm sessile polyp (hyperplastic) from the sigmoid and noted a healthy-appearing ileocolonic anastomosis as well as small nonbleeding internal hemorrhoids.  Dr. Fuller Plan did not suggest pursuing another surveillance colonoscopy due to patient's age.  Presented to [facility] and now transferred to [place]. Family found her on the ground today.  Suspect she'd had seizure as while  awaiting EMS to arrive she had a seizure. For several hours yesterday patient was experiencing pain in her right upper quadrant, epigastrium that radiated around to the ipsilateral scapula/shoulder.  There was no nausea or vomiting.  Once the pain subside it did not return.  No previous similar pain.  Appetite has been poor for over a year. CMET revealed T bili 3.7, alk phos 315.  AST/ALT 315/235.  Lipase 25. Elevated venous lactic acid is improving.  WBCs 25.6. Abdominal ultrasound shows worsened liver consistent with cirrhosis.  PV patent with normal directional flow.  Cholelithiasis but no cholecystitis.  5 mm CBD. MRI/MRCP: Study limited due to respiratory artifact.  Uncomplicated cholelithiasis.  Suspect small stone at distal CBD but duct is not dilated.  Pancreas atrophic.  Pancreatic duct dilated in the region of the mid body/tail with abrupt transition but no obvious obstructing lesion. ? Pancreatic stricture?Marland Kitchen   For further evaluation pancreatic protocol CT may provide better visualization.  Patient used to be obese in past years. No history of any alcohol intake.  No family history of liver disease.     Past Medical History:  Diagnosis Date   Anemia    Diabetes mellitus, type II ([place])    Glaucoma    Dr.Hecker   History of colon cancer 1998   Dr. Lennie Hummer   Hyperlipemia    Osteopenia 02/2012, 03/2014   DEXA hip -2.2   Renal insufficiency    Seizures ([facility])    Stenosing tenosynovitis of finger of left hand 2016   index - s/p steroid injection x2    Past Surgical History:  Procedure Laterality Date   ABI  11/2013   WNL   APPENDECTOMY     CARPAL TUNNEL RELEASE     Bilateral    CATARACT EXTRACTION Bilateral 2006   Implants   COLECTOMY  1998   For cancer   COLONOSCOPY  2008   Dr.Stark, Due 2011   COLONOSCOPY  06/2013   1 hyperplastic polyp, ileocecal anastomosis Fuller Plan)   dexa  02/2012, 03/2014   T score -2.2 at hip overall stable   REFRACTIVE SURGERY     TONSILLECTOMY AND ADENOIDECTOMY     TOTAL KNEE ARTHROPLASTY Left 12/17/2016   Procedure: LEFT TOTAL KNEE ARTHROPLASTY;  Surgeon: Gaynelle Arabian, MD;  Location: [facility];  Service: Orthopedics;  Laterality: Left;    Prior to Admission medications   Medication Sig Start Date End Date Taking? Authorizing Provider  acetaminophen (TYLENOL) 325 MG tablet Take 325-650 mg by mouth every 6 (six) hours as needed (for pain).   Yes [provider]  aspirin 81 MG EC tablet Take 1 tablet (81 mg total) by mouth daily. Swallow whole. 03/07/19  Yes Mariel Aloe, MD  atorvastatin (LIPITOR) 40 MG tablet Take 1 tablet (40 mg total) by mouth daily at 6 PM. 02/19/20  Yes [name], MD  Biotin 10000 MCG TABS Take 10,000 mcg by mouth daily.   Yes [provider]  Carboxymethylcellul-Glycerin 1-0.25 % SOLN Place 1-2 drops into both eyes 3 (three) times daily as needed (for dry/irritated eyes.).   Yes [provider]  carvedilol (COREG) 12.5 MG tablet Take 1 tablet (12.5 mg total) by mouth 2 (two) times daily. 02/19/20  Yes [name], MD  Cholecalciferol (VITAMIN D3) 25 MCG (1000 UT) CAPS Take 1 capsule (1,000 Units total) by mouth daily. Patient taking differently: Take 2,000 Units by mouth daily. 03/18/19  Yes [name], MD  Cinnamon 500 MG capsule Take 1,000 mg by mouth daily.   Yes [provider]  colchicine 0.6 MG tablet Take 1 tablet (0.6 mg total) by mouth daily as needed (gout flare). First day of gout flare, may take 1 tablet twice daily. Patient taking differently: Take 0.6 mg by mouth daily as needed (for gout flares, and may take 0.6 mg TWICE DAILY on 1st day of flare). 02/19/20  Yes [name], MD  Ferrous Sulfate (IRON) 325 (65 Fe) MG TABS Take 325 mg by mouth See admin instructions. Take 325 mg by mouth in the evening on Mondays and Thursdays only   Yes [provider]  furosemide (LASIX) 20 MG tablet Take 1 tablet by mouth once daily Patient taking differently: Take 20 mg by mouth in the morning. 05/13/20  Yes [name], MD  HUMALOG KWIKPEN 100 UNIT/ML KiwkPen Inject 1-10 Units into the skin See admin instructions. Inject 1-10 units into the skin three times a day with meals, PER SLIDING SCALE 10/04/16  Yes [provider]  LANTUS SOLOSTAR 100 UNIT/ML Solostar Pen Inject 16 Units into the skin at bedtime. 08/19/19  Yes [name], MD  levETIRAcetam (KEPPRA) 250 MG tablet Take 1 tablet by mouth twice daily Patient taking differently: Take 250 mg by mouth 2 (two) times daily. 03/25/20  Yes Lomax, Amy, NP  Multiple Vitamins-Minerals (PRESERVISION AREDS 2 PO) Take 1 capsule by mouth 2 (two) times daily.   Yes [provider]  potassium chloride (KLOR-CON) 10 MEQ tablet TAKE 1 TABLET BY MOUTH ON MONDAY, [place] Patient taking differently: Take 10 mEq by mouth every Monday, Wednesday, and Friday. 04/20/20   Yes [name], MD  ramipril (ALTACE) 5 MG capsule Take 1 capsule by mouth once daily Patient taking differently: Take 5 mg by mouth daily. 05/13/20  Yes [name], MD  B-D ULTRAFINE III SHORT PEN 31G X 8 MM MISC USE AS DIRECTED WITH INSULIN 07/09/11   Hendricks Limes, MD    Scheduled Meds:  enoxaparin (LOVENOX) injection  30 mg Subcutaneous Q24H   metroNIDAZOLE  500 mg Oral Q8H   sodium chloride flush  3 mL Intravenous Q12H   Infusions:  sodium chloride 100 mL/hr at 05/30/20 1545   cefTRIAXone (ROCEPHIN)  IV  PRN Meds: albuterol, ondansetron **OR** ondansetron (ZOFRAN) IV   Allergies as of 05/30/2020 - Review Complete 05/30/2020  Allergen Reaction Noted   Gabapentin Other (See Comments) 04/13/2020   Pravastatin sodium Other (See Comments)     Family History  Problem Relation Age of Onset   Heart attack Father 73   Stroke Father 20   Diabetes Father    Hypertension Father    Heart disease Father        before age 76   Breast cancer Mother    Cancer Mother    Diabetes Sister    Cancer Sister    Peripheral vascular disease Sister    Breast cancer Sister    Hypertension Son    COPD Neg Hx    Asthma Neg Hx    Colon cancer Neg Hx    Esophageal cancer Neg Hx    Rectal cancer Neg Hx    Stomach cancer Neg Hx     Social History   Socioeconomic History   Marital status: Widowed    Spouse name: Not on file   Number of children: 4   Years of education: Not on file   Highest education level: High school graduate  Occupational History   Occupation: retired    Fish farm manager: Retired    Comment: Regulatory affairs officer  Tobacco Use   Smoking status: Former Smoker    Years: 2.00    Types: Cigarettes    Quit date: 06/04/1973    Years since quitting: 47.0   Smokeless tobacco: Never Used   Tobacco comment: Smoked for 5 years 1965-1970 up to 1/2 pp week  Vaping Use   Vaping Use: Never used  Substance and Sexual Activity   Alcohol use:  Not Currently   Drug use: No   Sexual activity: Never  Other Topics Concern   Not on file  Social History Narrative   03/10/19 Lives alone, no pets   Neighbors keep an eye on each other - 12 unit condo.    Family nearby.   Occ: retired Regulatory affairs officer   Activity: walking   Diet: good water, fruits/vegetables daily   Caffeine- some tea, occas soda   Social Determinants of Radio broadcast assistant Strain: Not on file  Food Insecurity: Not on file  Transportation Needs: Not on file  Physical Activity: Not on file  Stress: Not on file  Social Connections: Not on file  Intimate Partner Violence: Not on file    REVIEW OF SYSTEMS: Constitutional: No fatigue, malaise or weakness. ENT:  No nose bleeds Pulm: Cough or shortness of breath CV:  No palpitations, no LE edema.  No angina GU:  No hematuria, no frequency.  No dark-colored urine GI: See HPI Heme: [place] unusual or excessive bleeding or bruising Transfusions: None Neuro: Agents altered sensorium has resolved and she is able to provide history and answer all questions.  No headaches, no peripheral tingling or numbness Derm:  No itching, no rash or sores.  Endocrine:  No sweats or chills.  No polyuria or dysuria Immunization: Vaccinated x3 for COVID-19. Travel:  None beyond local counties in last few months.    PHYSICAL EXAM: Vital signs in last 24 hours: Vitals:   05/30/20 1407  BP: (!) 141/53  Pulse: 71  Resp: 16  Temp: 99.1 F (37.3 C)  SpO2: 100%   Wt Readings from Last 3 Encounters:  05/29/20 50.8 kg  02/19/20 51.8 kg  09/08/19 51.6 kg    General: Pleasant, looks younger than stated age.  Comfortable and not ill-looking Head: No facial asymmetry, no signs of head trauma. Eyes: No scleral icterus or conjunctival pallor Ears: No hearing deficit Nose: No congestion or discharge Mouth: Dentures in place.  Mucosa is pink, moist, clear.  Tongue is midline. Neck: No JVD, masses, thyromegaly Lungs: Clear  bilaterally with good breath sounds.  No cough or dyspnea Heart: RRR.  No MRG.  S1, S2 present Abdomen: Soft.  Minimal tenderness in the right upper quadrant.  Bowel sounds are active.  No distention.  No masses, HSM, bruits, hernias.   Rectal: Deferred Musc/Skeltl: No signs of trauma to the limbs or trunk Extremities: No CCE Neurologic:.  Oriented x3.  Alert.  Fluid speech.  Moves all 4 limbs without tremor, strength not tested Skin: No jaundice.  No telangiectasia. Nodes: No cervical adenopathy Psych: Calm, cooperative, pleasant.  Intake/Output from previous day: No intake/output data recorded. Intake/Output this shift: No intake/output data recorded.  LAB RESULTS: Recent Labs    05/29/20 1932 05/30/20 0139  WBC 17.8* 25.6*  HGB 11.4* 10.7*  HCT 35.0* 31.4*  PLT 156 147*   BMET Lab Results  Component Value Date   NA 139 05/29/2020   NA 144 02/12/2020   NA 138 03/06/2019   K 4.3 05/29/2020   K 4.5 02/12/2020   K 4.3 03/06/2019   CL 104 05/29/2020   CL 108 02/12/2020   CL 105 03/06/2019   CO2 22 05/29/2020   CO2 28 02/12/2020   CO2 20 (L) 03/06/2019   GLUCOSE 205 (H) 05/29/2020   GLUCOSE 158 (H) 02/12/2020   GLUCOSE 166 (H) 03/06/2019   BUN 37 (H) 05/29/2020   BUN 47 (H) 02/12/2020   BUN 29 (H) 03/06/2019   CREATININE 1.40 (H) 05/29/2020   CREATININE 1.41 (H) 02/12/2020   CREATININE 1.4 (A) 04/06/2019   CALCIUM 8.8 (L) 05/29/2020   CALCIUM 9.5 02/12/2020   CALCIUM 8.8 (L) 03/06/2019   LFT Recent Labs    05/29/20 1932 05/30/20 0139  PROT 6.3* 6.2*  ALBUMIN 3.2* 3.1*  AST 315* 219*  ALT 235* 207*  ALKPHOS 282* 270*  BILITOT 3.3* 3.7*  BILIDIR  --  2.2*  IBILI  --  1.5*   PT/INR Lab Results  Component Value Date   INR 1.1 03/05/2019   INR 1.07 12/11/2016   INR 1.1 (H) 11/20/2016   Hepatitis Panel No results for input(s): HEPBSAG, HCVAB, HEPAIGM, HEPBIGM in the last 72 hours. C-Diff No components found for: CDIFF Lipase     Component Value  Date/Time   LIPASE 25 05/29/2020 2230    Drugs of Abuse     Component Value Date/Time   LABOPIA NONE DETECTED 03/06/2019 0050   COCAINSCRNUR NONE DETECTED 03/06/2019 0050   LABBENZ NONE DETECTED 03/06/2019 0050   AMPHETMU NONE DETECTED 03/06/2019 0050   THCU NONE DETECTED 03/06/2019 0050   LABBARB NONE DETECTED 03/06/2019 0050     RADIOLOGY STUDIES: CT Head Wo Contrast  Result Date: 05/29/2020 CLINICAL DATA:  Seizures.  Found on ground. EXAM: CT HEAD WITHOUT CONTRAST CT CERVICAL SPINE WITHOUT CONTRAST TECHNIQUE: Multidetector CT imaging of the head and cervical spine was performed following the standard protocol without intravenous contrast. Multiplanar CT image reconstructions of the cervical spine were also generated. COMPARISON:  Head CT 03/05/2019, head and neck CTA 03/05/2019, and head MRI 03/06/2019 FINDINGS: CT HEAD FINDINGS Brain: There is no evidence of an acute infarct, intracranial hemorrhage, mass, midline shift, or extra-axial fluid collection. Hypodensities in the cerebral white matter bilaterally  are unchanged and nonspecific but compatible with mild chronic small vessel ischemic disease. A chronic lacunar infarct is again noted in the right basal ganglia. Moderately advanced cerebral atrophy is unchanged. Vascular: Calcified atherosclerosis at the skull base. No hyperdense vessel. Skull: No fracture or suspicious osseous lesion. Sinuses/Orbits: Visualized paranasal sinuses and mastoid air cells are clear. Bilateral cataract extraction. Other: None. CT CERVICAL SPINE FINDINGS Alignment: Cervical spine straightening. Trace anterolisthesis of C4 on C5. Skull base and vertebrae: No acute fracture or suspicious osseous lesion. Moderate median C1-2 arthropathy. Soft tissues and spinal canal: No prevertebral fluid or swelling. No visible canal hematoma. Disc levels: Severe disc space narrowing at C5-6 and C6-7 with uncovertebral spurring resulting in only mild bilateral neural foraminal  stenosis. Mild cervical facet arthrosis, most notable on the left at C4-5. Upper chest: Clear lung apices. Other: Mild carotid atherosclerosis. IMPRESSION: 1. No evidence of acute intracranial abnormality. 2. Mild chronic small vessel ischemic disease and moderately advanced cerebral atrophy. 3. No acute cervical spine fracture. Electronically Signed   By: Logan Bores M.D.   On: 05/29/2020 20:13   CT Cervical Spine Wo Contrast  Result Date: 05/29/2020 CLINICAL DATA:  Seizures.  Found on ground. EXAM: CT HEAD WITHOUT CONTRAST CT CERVICAL SPINE WITHOUT CONTRAST TECHNIQUE: Multidetector CT imaging of the head and cervical spine was performed following the standard protocol without intravenous contrast. Multiplanar CT image reconstructions of the cervical spine were also generated. COMPARISON:  Head CT 03/05/2019, head and neck CTA 03/05/2019, and head MRI 03/06/2019 FINDINGS: CT HEAD FINDINGS Brain: There is no evidence of an acute infarct, intracranial hemorrhage, mass, midline shift, or extra-axial fluid collection. Hypodensities in the cerebral white matter bilaterally are unchanged and nonspecific but compatible with mild chronic small vessel ischemic disease. A chronic lacunar infarct is again noted in the right basal ganglia. Moderately advanced cerebral atrophy is unchanged. Vascular: Calcified atherosclerosis at the skull base. No hyperdense vessel. Skull: No fracture or suspicious osseous lesion. Sinuses/Orbits: Visualized paranasal sinuses and mastoid air cells are clear. Bilateral cataract extraction. Other: None. CT CERVICAL SPINE FINDINGS Alignment: Cervical spine straightening. Trace anterolisthesis of C4 on C5. Skull base and vertebrae: No acute fracture or suspicious osseous lesion. Moderate median C1-2 arthropathy. Soft tissues and spinal canal: No prevertebral fluid or swelling. No visible canal hematoma. Disc levels: Severe disc space narrowing at C5-6 and C6-7 with uncovertebral spurring  resulting in only mild bilateral neural foraminal stenosis. Mild cervical facet arthrosis, most notable on the left at C4-5. Upper chest: Clear lung apices. Other: Mild carotid atherosclerosis. IMPRESSION: 1. No evidence of acute intracranial abnormality. 2. Mild chronic small vessel ischemic disease and moderately advanced cerebral atrophy. 3. No acute cervical spine fracture. Electronically Signed   By: Logan Bores M.D.   On: 05/29/2020 20:13   CT Thoracic Spine Wo Contrast  Result Date: 05/29/2020 CLINICAL DATA:  Seizures.  Found on ground. EXAM: CT THORACIC AND LUMBAR SPINE WITHOUT CONTRAST TECHNIQUE: Multidetector CT imaging of the thoracic and lumbar spine was performed without contrast. Multiplanar CT image reconstructions were also generated. COMPARISON:  CT abdomen and pelvis 06/13/2009 FINDINGS: CT THORACIC SPINE FINDINGS Alignment: Mild thoracic dextroscoliosis.  No listhesis. Vertebrae: No acute fracture or suspicious osseous lesion. Paraspinal and other soft tissues: Aortic and coronary atherosclerosis. Disc levels: Mild-to-moderate multilevel disc degeneration and moderate facet arthrosis in the thoracic spine. No evidence of high-grade spinal stenosis. Moderate left neural foraminal stenosis at T11-12 due to prominent facet spurring. CT LUMBAR SPINE FINDINGS Segmentation: 5  lumbar type vertebrae. Alignment: Mild lumbar levoscoliosis.  No listhesis. Vertebrae: No acute fracture or suspicious osseous lesion. Paraspinal and other soft tissues: Nonobstructing 1.5 cm left renal stone, much larger than in 2011. Partially visualized left renal cyst measuring at least 3.8 cm. Abdominal aortic atherosclerosis without aneurysm. Disc levels: Mild disc space narrowing at L2-3 with severe narrowing at L4-5. Disc bulging and posterior element hypertrophy result in severe spinal stenosis at L4-5 and mild spinal stenosis at L3-4 greater than L2-3. There is moderate multilevel neural foraminal stenosis.  IMPRESSION: 1. No acute osseous abnormality identified in the thoracic or lumbar spine. 2. Thoracic and lumbar disc and facet degeneration as above. Severe spinal stenosis at L4-5. 3. Large nonobstructing left renal stone. 4. Aortic Atherosclerosis (ICD10-I70.0). Electronically Signed   By: Logan Bores M.D.   On: 05/29/2020 20:53   CT Lumbar Spine Wo Contrast  Result Date: 05/29/2020 CLINICAL DATA:  Seizures.  Found on ground. EXAM: CT THORACIC AND LUMBAR SPINE WITHOUT CONTRAST TECHNIQUE: Multidetector CT imaging of the thoracic and lumbar spine was performed without contrast. Multiplanar CT image reconstructions were also generated. COMPARISON:  CT abdomen and pelvis 06/13/2009 FINDINGS: CT THORACIC SPINE FINDINGS Alignment: Mild thoracic dextroscoliosis.  No listhesis. Vertebrae: No acute fracture or suspicious osseous lesion. Paraspinal and other soft tissues: Aortic and coronary atherosclerosis. Disc levels: Mild-to-moderate multilevel disc degeneration and moderate facet arthrosis in the thoracic spine. No evidence of high-grade spinal stenosis. Moderate left neural foraminal stenosis at T11-12 due to prominent facet spurring. CT LUMBAR SPINE FINDINGS Segmentation: 5 lumbar type vertebrae. Alignment: Mild lumbar levoscoliosis.  No listhesis. Vertebrae: No acute fracture or suspicious osseous lesion. Paraspinal and other soft tissues: Nonobstructing 1.5 cm left renal stone, much larger than in 2011. Partially visualized left renal cyst measuring at least 3.8 cm. Abdominal aortic atherosclerosis without aneurysm. Disc levels: Mild disc space narrowing at L2-3 with severe narrowing at L4-5. Disc bulging and posterior element hypertrophy result in severe spinal stenosis at L4-5 and mild spinal stenosis at L3-4 greater than L2-3. There is moderate multilevel neural foraminal stenosis. IMPRESSION: 1. No acute osseous abnormality identified in the thoracic or lumbar spine. 2. Thoracic and lumbar disc and facet  degeneration as above. Severe spinal stenosis at L4-5. 3. Large nonobstructing left renal stone. 4. Aortic Atherosclerosis (ICD10-I70.0). Electronically Signed   By: Logan Bores M.D.   On: 05/29/2020 20:53   MR 3D Recon At Scanner  Result Date: 05/30/2020 CLINICAL DATA:  Right upper quadrant abdominal pain. Cholelithiasis. EXAM: MRI ABDOMEN WITHOUT AND WITH CONTRAST (INCLUDING MRCP) TECHNIQUE: Multiplanar multisequence MR imaging of the abdomen was performed both before and after the administration of intravenous contrast. Heavily T2-weighted images of the biliary and pancreatic ducts were obtained, and three-dimensional MRCP images were rendered by post processing. CONTRAST:  7.10m GADAVIST GADOBUTROL 1 MMOL/ML IV SOLN COMPARISON:  Ultrasound examination, same date. FINDINGS: Examination is limited due to respiratory motion. Lower chest: The lung bases are clear of an acute process. No pleural effusions or pericardial effusion. Hepatobiliary: No hepatic lesions are identified. No intrahepatic biliary dilatation. Layering dependent gallstones are noted in the gallbladder but no MR findings suspicious for acute cholecystitis. No common bile duct dilatation. Suspect small common bile duct stones distally. Low insertion of the cystic. Pancreas: Significant pancreatic atrophy. This is most notable in the distal body/tail regions there is virtually no pancreatic tissue. Pancreatic duct is abruptly dilated at the midbody level no pancreatic mass is identified. This could be due to a  ductal stricture. Spleen:  Normal size. No focal lesions. Adrenals/Urinary Tract:  The adrenal glands are unremarkable. There are simple renal cysts and a 10 mm left renal calculus. No worrisome renal lesions or hydronephrosis. Stomach/Bowel: The stomach, duodenum, visualized small bowel and visualized colon are grossly normal. Vascular/Lymphatic: Advanced atherosclerotic calcifications involving the aorta but aneurysm dissection. The  branch vessels are patent. The major venous structures are patent. Other: No ascites or abdominal hernia. No obvious free air or free fluid. Musculoskeletal: No significant bony findings. IMPRESSION: 1. Examination is limited due to respiratory motion. 2. Cholelithiasis but no MR findings for acute cholecystitis. 3. Suspect small common bile duct stones distally. No common bile duct dilatation. 4. Significant pancreatic atrophy. There is also pancreatic duct dilatation in the mid body tail region with an abrupt transition but no obvious obstructing lesion. Possible pancreatic stricture. CT abdomen with pancreatic protocol may be helpful for further evaluation. 5. Simple renal cysts and a 10 mm left renal calculus. Electronically Signed   By: Marijo Sanes M.D.   On: 05/30/2020 05:13   MR ABDOMEN MRCP W WO CONTAST  Result Date: 05/30/2020 CLINICAL DATA:  Right upper quadrant abdominal pain. Cholelithiasis. EXAM: MRI ABDOMEN WITHOUT AND WITH CONTRAST (INCLUDING MRCP) TECHNIQUE: Multiplanar multisequence MR imaging of the abdomen was performed both before and after the administration of intravenous contrast. Heavily T2-weighted images of the biliary and pancreatic ducts were obtained, and three-dimensional MRCP images were rendered by post processing. CONTRAST:  7.92m GADAVIST GADOBUTROL 1 MMOL/ML IV SOLN COMPARISON:  Ultrasound examination, same date. FINDINGS: Examination is limited due to respiratory motion. Lower chest: The lung bases are clear of an acute process. No pleural effusions or pericardial effusion. Hepatobiliary: No hepatic lesions are identified. No intrahepatic biliary dilatation. Layering dependent gallstones are noted in the gallbladder but no MR findings suspicious for acute cholecystitis. No common bile duct dilatation. Suspect small common bile duct stones distally. Low insertion of the cystic. Pancreas: Significant pancreatic atrophy. This is most notable in the distal body/tail regions  there is virtually no pancreatic tissue. Pancreatic duct is abruptly dilated at the midbody level no pancreatic mass is identified. This could be due to a ductal stricture. Spleen:  Normal size. No focal lesions. Adrenals/Urinary Tract:  The adrenal glands are unremarkable. There are simple renal cysts and a 10 mm left renal calculus. No worrisome renal lesions or hydronephrosis. Stomach/Bowel: The stomach, duodenum, visualized small bowel and visualized colon are grossly normal. Vascular/Lymphatic: Advanced atherosclerotic calcifications involving the aorta but aneurysm dissection. The branch vessels are patent. The major venous structures are patent. Other: No ascites or abdominal hernia. No obvious free air or free fluid. Musculoskeletal: No significant bony findings. IMPRESSION: 1. Examination is limited due to respiratory motion. 2. Cholelithiasis but no MR findings for acute cholecystitis. 3. Suspect small common bile duct stones distally. No common bile duct dilatation. 4. Significant pancreatic atrophy. There is also pancreatic duct dilatation in the mid body tail region with an abrupt transition but no obvious obstructing lesion. Possible pancreatic stricture. CT abdomen with pancreatic protocol may be helpful for further evaluation. 5. Simple renal cysts and a 10 mm left renal calculus. Electronically Signed   By: PMarijo SanesM.D.   On: 05/30/2020 05:13   [place]dom[place] Limited RUQ (LIVER/GB)  Result Date: 05/29/2020 CLINICAL DATA:  86year old female with right upper quadrant abdominal pain. EXAM: ULTRASOUND ABDOMEN LIMITED RIGHT UPPER QUADRANT COMPARISON:  Right upper quadrant ultrasound dated 06/13/2009. FINDINGS: Gallbladder: There is sludge and  small stones within the gallbladder. There is no gallbladder wall thickening or pericholecystic fluid. Negative sonographic Murphy sign. Common bile duct: Diameter: 5 mm Liver: Coarsened liver echotexture with slight surface nodularity consistent morphologic  changes of cirrhosis. Portal vein is patent on color Doppler imaging with normal direction of blood flow towards the liver. Other: None. IMPRESSION: 1. Cholelithiasis without sonographic evidence of acute cholecystitis. 2. Cirrhosis. 3. Patent main portal vein with hepatopetal flow. Electronically Signed   By: Anner Crete M.D.   On: 05/29/2020 22:19      IMPRESSION:   *   Acute seizure activity in pt on antiepileptic meds.  *    Elevated LFTs.  Several hours of right upper quadrant/right shoulder pain yesterday.  Between the ultrasound and MRCP there is possible small stone in the distal CBD, dilated PD w possible pancreatic stricture, cirrhotic appearing liver.  *   Remote colon cancer.  At most recent colonoscopy in 2015 Dr. Rockney Ghee dark removed a small hyperplastic polyp.  He does not recommend further cancer surveillance colonoscopies given her age.    *   New diagnosis of cirrhosis of the liver.  Most likely this is evolution of fatty liver but on ultrasound of 2011 and CT scan of 2011 the liver was not fatty.  *   Normocytic anemia.  Hb 10.7.  Baseline looks to be about 11.5 to 14 as of 03/2019 labs.  Takes chronic iron twice weekly on Mondays, Thursdays.  *    IDDM.    PLAN:     *   Per Dr Lyndel Safe.  ERCP is set up for tomorrow around 1 PM. I discontinued orders for the Lovenox, replaced with order for PAS hose.  *   Dr. Tamala Julian has already ordered oral Flagyl and daily Rocephin so will not add additional pre-ERCP antibiotics.  *    As there is no nausea I am going to let her have carb modified diet tonight and n.p.o. after midnight.    *   CA 19-9. CEA level  *   Pancreatic protocol CT.     Azucena Freed  05/30/2020, 3:53 PM Phone [phone]   Attending physician's note   I have taken an interval history, reviewed the chart and examined the patient. I agree with the Advanced Practitioner's note, impression and recommendations.   Choledocholithiasis with ?mild asc  cholangitis and abn LFTs. Pt with cholelithiasis Mid PD stricture with upstream dilatation ?etiology Benign vs malignant. H/O wt loss (38lb over 1 year) is concerning ?Early cirrhosis on [place] without portal HTN (incidental finding) H/O recent SZ Remote H/O colon ca s/p R hemicolectomy 1988. Last colon 06/2013- neg  Plan: -IV ABx -MRCP reviewed. There is resp artifact. However, 3D recon images are good. -Proceed with CT pancreatic protocol as suggested by radiology. -ERCP in AM.  Discussed risks and benefits with pt and daughter including small but definite risks of pancreatitis, bleeding, perforation, risks of anesthesia.  Also try to evaluate PD if possible. -Check CA 19-9, CEA, IgG4 -Trend CBC, LFTs    Carmell Austria, MD [facility] [phone]

## 2020-05-30 NOTE — ED Provider Notes (Signed)
Radiologist calls back to say that Ms. Ahn has debris in the distal common bile duct and gallbladder and some sludge there may be some signal around the wall that shows mild acute cholecystitis.  There is some pancreatic atrophy as well.  Radiologist will have the body MRI doctor review this in the morning.  He said there was some movement artifact.  Patient herself is awake alert comfortable and having absolutely no abdominal pain anymore.  Since her lactic acid was elevated and her white count was elevated and her liver looked inflamed on the blood work I will repeat these.  If she is indeed doing better we will try to let her go home follow-up with neurology and surgery.  If she is not doing better we will get her in the hospital.   Nena Polio, MD 05/30/20 [phone]

## 2020-05-31 ENCOUNTER — Encounter (HOSPITAL_COMMUNITY)
Admission: RE | Disposition: A | Discharge status: Rehabilitation Facility | Payer: Self-pay | Source: Other Acute Inpatient Hospital | Attending: Internal Medicine

## 2020-05-31 ENCOUNTER — Inpatient Hospital Stay (HOSPITAL_COMMUNITY): Payer: Medicare Other | Admitting: Certified Registered Nurse Anesthetist

## 2020-05-31 ENCOUNTER — Inpatient Hospital Stay (HOSPITAL_COMMUNITY): Payer: Medicare Other

## 2020-05-31 ENCOUNTER — Other Ambulatory Visit: Payer: Self-pay

## 2020-05-31 DIAGNOSIS — K831 Obstruction of bile duct: Secondary | ICD-10-CM

## 2020-05-31 DIAGNOSIS — N183 Chronic kidney disease, stage 3 unspecified: Secondary | ICD-10-CM | POA: Diagnosis not present

## 2020-05-31 DIAGNOSIS — K8050 Calculus of bile duct without cholangitis or cholecystitis without obstruction: Secondary | ICD-10-CM

## 2020-05-31 DIAGNOSIS — K8020 Calculus of gallbladder without cholecystitis without obstruction: Secondary | ICD-10-CM | POA: Diagnosis not present

## 2020-05-31 DIAGNOSIS — K2970 Gastritis, unspecified, without bleeding: Secondary | ICD-10-CM

## 2020-05-31 HISTORY — PX: ENDOSCOPIC RETROGRADE CHOLANGIOPANCREATOGRAPHY (ERCP) WITH PROPOFOL: SHX5810

## 2020-05-31 HISTORY — PX: BIOPSY: SHX5522

## 2020-05-31 HISTORY — PX: BILIARY DILATION: SHX6850

## 2020-05-31 HISTORY — PX: REMOVAL OF STONES: SHX5545

## 2020-05-31 HISTORY — PX: SPHINCTEROTOMY: SHX5544

## 2020-05-31 LAB — CBC
HCT: 27.5 % — ABNORMAL LOW (ref 36.0–46.0)
Hemoglobin: 9 g/dL — ABNORMAL LOW (ref 12.0–15.0)
MCH: 31.8 pg (ref 26.0–34.0)
MCHC: 32.7 g/dL (ref 30.0–36.0)
MCV: 97.2 fL (ref 80.0–100.0)
Platelets: 121 10*3/uL — ABNORMAL LOW (ref 150–400)
RBC: 2.83 MIL/uL — ABNORMAL LOW (ref 3.87–5.11)
RDW: 12.7 % (ref 11.5–15.5)
WBC: 12.2 10*3/uL — ABNORMAL HIGH (ref 4.0–10.5)
nRBC: 0 % (ref 0.0–0.2)

## 2020-05-31 LAB — COMPREHENSIVE METABOLIC PANEL
ALT: 119 U/L — ABNORMAL HIGH (ref 0–44)
AST: 86 U/L — ABNORMAL HIGH (ref 15–41)
Albumin: 2.4 g/dL — ABNORMAL LOW (ref 3.5–5.0)
Alkaline Phosphatase: 208 U/L — ABNORMAL HIGH (ref 38–126)
Anion gap: 9 (ref 5–15)
BUN: 31 mg/dL — ABNORMAL HIGH (ref 8–23)
CO2: 25 mmol/L (ref 22–32)
Calcium: 8.2 mg/dL — ABNORMAL LOW (ref 8.9–10.3)
Chloride: 107 mmol/L (ref 98–111)
Creatinine, Ser: 1.68 mg/dL — ABNORMAL HIGH (ref 0.44–1.00)
GFR, Estimated: 30 mL/min — ABNORMAL LOW (ref >60)
Glucose, Bld: 193 mg/dL — ABNORMAL HIGH (ref 70–99)
Potassium: 4 mmol/L (ref 3.5–5.1)
Sodium: 141 mmol/L (ref 135–145)
Total Bilirubin: 3.1 mg/dL — ABNORMAL HIGH (ref 0.3–1.2)
Total Protein: 5.1 g/dL — ABNORMAL LOW (ref 6.5–8.1)

## 2020-05-31 LAB — GLUCOSE, CAPILLARY
Glucose-Capillary: 108 mg/dL — ABNORMAL HIGH (ref 70–99)
Glucose-Capillary: 152 mg/dL — ABNORMAL HIGH (ref 70–99)
Glucose-Capillary: 150 mg/dL — ABNORMAL HIGH (ref 70–99)
Glucose-Capillary: 196 mg/dL — ABNORMAL HIGH (ref 70–99)

## 2020-05-31 SURGERY — ENDOSCOPIC RETROGRADE CHOLANGIOPANCREATOGRAPHY (ERCP) WITH PROPOFOL
Anesthesia: General

## 2020-05-31 MED ORDER — PROPOFOL 10 MG/ML IV BOLUS
INTRAVENOUS | Status: DC | PRN
Start: 2020-05-31 — End: 2020-05-31
  Administered 2020-05-31: 100 mg via INTRAVENOUS

## 2020-05-31 MED ORDER — GLUCAGON HCL RDNA (DIAGNOSTIC) 1 MG IJ SOLR
INTRAMUSCULAR | Status: AC
  Filled 2020-05-31: qty 1

## 2020-05-31 MED ORDER — EPHEDRINE SULFATE 50 MG/ML IJ SOLN
INTRAMUSCULAR | Status: DC | PRN
  Administered 2020-05-31: 10 mg via INTRAVENOUS

## 2020-05-31 MED ORDER — GLUCAGON HCL RDNA (DIAGNOSTIC) 1 MG IJ SOLR
INTRAMUSCULAR | Status: DC | PRN
  Administered 2020-05-31 (×5): .25 mg via INTRAVENOUS

## 2020-05-31 MED ORDER — INDOMETHACIN 50 MG RE SUPP
RECTAL | Status: DC | PRN
  Administered 2020-05-31: 100 mg via RECTAL

## 2020-05-31 MED ORDER — SODIUM CHLORIDE 0.9 % IV SOLN
INTRAVENOUS | Status: DC | PRN
  Administered 2020-05-31: 20 mL

## 2020-05-31 MED ORDER — ROCURONIUM BROMIDE 10 MG/ML (PF) SYRINGE
PREFILLED_SYRINGE | INTRAVENOUS | Status: DC | PRN
  Administered 2020-05-31: 60 mg via INTRAVENOUS

## 2020-05-31 MED ORDER — ONDANSETRON HCL 4 MG/2ML IJ SOLN
INTRAMUSCULAR | Status: DC | PRN
  Administered 2020-05-31: 4 mg via INTRAVENOUS

## 2020-05-31 MED ORDER — INDOMETHACIN 50 MG RE SUPP
RECTAL | Status: AC
  Filled 2020-05-31: qty 2

## 2020-05-31 MED ORDER — FENTANYL CITRATE (PF) 250 MCG/5ML IJ SOLN
INTRAMUSCULAR | Status: DC | PRN
  Administered 2020-05-31: 100 ug via INTRAVENOUS

## 2020-05-31 MED ORDER — FENTANYL CITRATE (PF) 100 MCG/2ML IJ SOLN
INTRAMUSCULAR | Status: AC
  Filled 2020-05-31: qty 2

## 2020-05-31 MED ORDER — DEXAMETHASONE SODIUM PHOSPHATE 10 MG/ML IJ SOLN
INTRAMUSCULAR | Status: DC | PRN
  Administered 2020-05-31: 4 mg via INTRAVENOUS

## 2020-05-31 MED ORDER — LIDOCAINE 2% (20 MG/ML) 5 ML SYRINGE
INTRAMUSCULAR | Status: DC | PRN
  Administered 2020-05-31: 60 mg via INTRAVENOUS

## 2020-05-31 NOTE — Interval H&P Note (Signed)
History and Physical Interval Note:  05/31/2020 9:56 AM  Taylor Castaneda  has presented today for surgery, with the diagnosis of CBD stone.  Abnormal appearing pancreatic duct.  Several hours of right upper quadrant radiating to back pain.  Elevated LFTs.  The various methods of treatment have been discussed with the patient and family. After consideration of risks, benefits and other options for treatment, the patient has consented to  Procedure(s): ENDOSCOPIC RETROGRADE CHOLANGIOPANCREATOGRAPHY (ERCP) WITH PROPOFOL (N/A) as a surgical intervention.  The patient's history has been reviewed, patient examined, no change in status, stable for surgery.  I have reviewed the patient's chart and labs.  Questions were answered to the patient's satisfaction.    I have also gone over the visual aid with the patient's daughter.  The risks including risks of pancreatitis, bleeding, perforation, risks of anesthesia were explained.  All the questions were answered.    Jackquline Denmark

## 2020-05-31 NOTE — Progress Notes (Signed)
On call NP Lang Snow notified that per radiology, they are not able to do CT of abdomen with IV contrast due to patient's kidney function GFR of 28. No  orders at this time.

## 2020-05-31 NOTE — Progress Notes (Signed)
PROGRESS NOTE    JACOBI RYANT  JEH:[id number] DOB: Oct 19, 1932 DOA: 05/30/2020 PCP: [name], MD   Brief Narrative:  HPI per Dr. Fuller Plan on 05/30/20 Taylor Castaneda is a 86 y.o. female with medical history significant of diabetes mellitus type 2, hyperlipidemia, seizure disorder, history colon cancer s/p resection, and anemia presented with complaints of passing out while at her condo.  The patient's daughter found her on the floor and was able to help her get to a chair.  However when EMS arrived patient had a witnessed generalized tonic-clonic seizure with urinary incontinence lasting approximately 1 minute prior to self resolving.  They did not have to give any abortive medications.  She had been compliant with her medications and last reported having a seizure over a year ago.  At baseline patient lives alone and handles all of her own medications.  Patient had not been feeling well earlier in the day with complaints of epigastric abdominal pain with radiation to her back.  Denied any recent trauma prior to onset of symptoms.  Symptoms lasted approximately 6 to 7 hours prior to self resolving.  She had taken Tums and Tylenol prior to coming into the hospital. CT scan of the head and cervical spine did not note any acute abnormalities.  CT imaging of the thoracic and lumbar spine noted severe spinal stenosis at L4-5 and large nonobstructing left renal stone.  Bowel sounds were noted to be stable.  Labs at the outside facility yesterday had been significant for WBC 17.8, hemoglobin 11.4, BUN 37, creatinine 1.4, alkaline phosphatase 283, AST 315, ALT 235, total bilirubin 3.3 and lactic acid initially 4->2.6.  COVID-19 and influenza screening was negative.  Right upper quadrant ultrasound revealed cholelithiasis without evidence of acute cholecystitis and cirrhosis.  Case had been discussed with GI here at [facility] who recommended MRCP which revealed cholelithiasis without signs of acute  cholecystitis, suspected small common bile duct stones distally with no duct dilatation, and pancreatic atrophy with dilatation in the mid body tail region with abrupt transition point concerning for possible stricture.  She had been given Keppra 1000 mg IV and 500 mL of normal saline IV fluids.  Patient had been accepted to the hospitalist service to a medical telemetry bed.  Repeat labs revealed WBC 25.6, hemoglobin 10.7, alkaline phosphatase 270, AST 219, ALT 207, and total bilirubin 3.7, lactic acid 2.2.  **Interim History  Patient underwent an ERCP today and she had a periampullary diverticulum and she was noted to have choledocholithiasis which is status post biliary sphincterotomy and sphincteroplasty and balloon extraction.  She had cholelithiasis without cholecystitis and she had a mild gastritis.  Pancreatogram was not obtained and GI recommended watching for pancreatitis, bleeding or perforation and cholangitis.  Recommend a clear liquid diet and continue current medications and continue antibiotics for 5 days total and stopping the Flagyl.  He also recommended obtaining a surgical consultation while inpatient for questionable lap chole and following the results of the CA 19-9, CEA, IgG for.  Dr. Lyndel Safe feels that she will require an EUS as an outpatient for evaluation of pancreatic duct stricture.  After procedure she was sedated and very somnolent and drowsy and unable to be aroused given that she was asleep  Assessment & Plan:   Principal Problem:   Cholelithiasis Active Problems:   Hyperlipidemia associated with type 2 diabetes mellitus ([place])   CKD (chronic kidney disease), stage III (HCC)   Hyperbilirubinemia   Normocytic anemia   Transaminitis  Pancreatic duct dilated   Common bile duct (CBD) obstruction   Choledocholithiasis Obstructive Jaundice with Hyperbilirubinemia and Abnormal LFTs -Acute.  Patient present with complaints of abdominal pain with radiation to her back.    -Initial CT imaging studies significant for cholelithiasis without signs of cholecystitis and cirrhosis.   -GI has been initially consulted and MRCP was recommended.  MRCP revealed cholelithiasis without cholecystitis, small gallstones distally without clear duct dilatation, pancreatic duct dilatation in the mid body region with an abrupt transition, but no obvious obstructing lesion.   -Patient transferred for need of ERCP and this was done today  -Admit to a medical telemetry -Check blood cultures and showed NGTD <24 hours  -Carb modified diet and n.p.o. after; Now on CLD diet per GI -Start empiric antibiotics of Rocephin and metronidazole; GI recommending stopping Flagyl  -Follow up on CA19-9, CEA, IgG4 -[place] [facility] consult, will follow-up for further recommendations -Underwent ERCP today and she had a periampullary diverticulum and she was noted to have choledocholithiasis which is status post biliary sphincterotomy and sphincteroplasty and balloon extraction.  She had cholelithiasis without cholecystitis and she had a mild gastritis.   -Pancreatogram was not obtained and GI recommended watching for pancreatitis, bleeding or perforation and cholangitis.   -Dr. Lyndel Safe Recommend a clear liquid diet and continue current medications and continue antibiotics for 5 days total and stopping the Flagyl. He also recommended obtaining a surgical consultation while inpatient for questionable lap chole and following the results of the CA 19-9, CEA, IgG4. -T Bili went from 3.3 -> 3.7 -> 3.1 -General Surgery consulted for further evaluation  -Will need PT/OT to evaluate and Treat to determine safe discharge disposition   Pancreatic Duct Dilatation  -As seen above.  Lipase was limits and no significant signs of inflammation noted on imaging. -CT of the abdomen with and without contrast -Follow-up CEA and CA 19-9 and IgG4 -Further Care Per GI -Dr. Lyndel Safe feels that she will require an EUS as an outpatient  for evaluation of pancreatic duct stricture as she did not have a Pancreatogram done here  Leukocytosis and lactic acidosis -Acute.  -WBC elevated from 17.8 to 25.6 with lactic acid initially elevated up to 4, but slowly trending down.  -Patient vital signs were noted to be relatively within normal limits not necessarily meeting sepsis criteria. -WBC is trending down and is now 12.2 and LA is trending down and LA was last 2.4 -Follow-up blood cultures and pending  -Check urinalysis and culture if necessary -Continue IV fluids -Trend lactic acid levels  Seizure Disorder -Patient had witnessed generalized tonic-clonic seizure lasting 1 minute with continence of urine yesterday.   -Suspect this could have been the reason patient had fallen on the floor as well.   -Home medications include Keppra 250 mg twice daily. Patient had received 1000 mg IV yesterday. -Seizure precaution  -Continue home regimen of Keppra for now  -May need to get an EEG  Essential Hypertension -Home blood pressure medications include Altace 5 mg daily, Coreg 12.5 mg twice daily, and furosemide 20 mg daily. -Continue to Hold furosemide but resumed Ramipril 5 mg po Daily -Continue Carvedilol 12.5 mg po BID -Continue to Monitor BP per Protocol   Acute kidney injury superimposed on chronic kidney disease IIIb -Creatinine was initially noted to be 1.4 (around patient's baseline), but repeat check today 1.76 with BUN 34 on admission; Now BUN/cr is 31/1.68 -Check CK and was 75 -Continue normal saline IV fluids at 100 mL/h -Avoid nephrotoxic medications,  contrast dyes, hypotension and renally dose medications -Repeat CMP in a.m.  Normocytic Anemia -Acute.    Hemoglobin/hematocrit has been slowly dropping from 11.4/35.0-trended down to 10.7/31.4 yesterday and today is now 9.0/27.5 -Check anemia panel in the a.m. -Continue to monitor for signs and symptoms of bleeding; currently no overt bleeding noted -Repeat CBC  in a.m.  Diabetes mellitus type 2, uncontrolled -On admission glucose elevated up to 205.  Last hemoglobin A1c noted to be 8.7 on 02/12/2020.  Home medication includes Lantus 16 units nightly. -Hypoglycemic protocol -Check hemoglobin A1c in the AM  -Continue half dose of Lantus -CBGs before every meal with moderate SSI  -CBGs ranging from 131-158  Abnormal LFTs and Hyperbilirubinemia:  -Acute.  Patient found to have AST 315->219 -> 86, ALT 235->207 -> 119, and total bilirubin 3.3->3.7 -> 3.1. -Continue to monitor and trend Hepatic Fxn Panel -Repeat CMP in the AM  Hyperlipidemia -Hold Atorvastatin due to elevated liver enzymes  DVT prophylaxis: SCDs Code Status: FULL CODE Family Communication: Spoke with Daughter at bedside  Disposition Plan: Pending further evaluation by PT OT, clearance by gastroenterology as well as general surgery  Status is: Inpatient  Remains inpatient appropriate because:Unsafe d/c plan, IV treatments appropriate due to intensity of illness or inability to take PO and Inpatient level of care appropriate due to severity of illness   Dispo: The patient is from: Home              Anticipated d/c is to: SNF              Anticipated d/c date is: 2 days              Patient currently is not medically stable to d/c.   Consultants:   Gastroenterology  General Surgery    Procedures:  ERCP Findings:      The scout film was normal. The upper GI tract was traversed under direct       vision without detailed examination. Mild antral gastritis was noted.      The major papilla was located at lower lip of periampullary diverticula.       The bile duct was deeply cannulated with the short-nosed traction       sphincterotome. Contrast was injected. I personally interpreted the bile       duct images. There was brisk flow of contrast through the ducts. Image       quality was adequate. Contrast extended to the entire biliary tree. 3       filling defects c/w  choledocholithiasis was found in CBD which was       mildly dilated to 10 mm. Low insertion of cystic duct was noted. Cystic       duct did fill. Multiple filling defects in the gallbladder consistent       with cholelithiasis.      A 6 mm biliary sphincterotomy was made with a monofilament traction       (standard) sphincterotome using ERBE electrocautery at 12 o'clock       position. There was no post-sphincterotomy bleeding. Due to       periampullary diverticula, we elected to proceed with sphincteroplasty.       Sphincteroplasty was performed using 03-15-11 mm x 5.5 cm CRE balloon to       a maximum balloon size of 10 mm. The biliary tree was swept multiple       times with a 12 mm balloon starting at the bifurcation. All  3 stones       were removed. Nothing was found on postocclusion cholangiogram. The bile       was green. There was no pus.      Pancreatogram was not obtained. We did try few times but the wire would       not find pancreatic duct after the sphincterotomy.      Mild antral gastritis was biopsied with a cold forceps for histology to       r/o HP. Impression:               -Periampullary diverticulum.                           -Choledocholithiasis s/p biliary sphincterotomy,                            sphincteroplasty and balloon extraction.                           -Cholelithiasis without cholecystitis.                           -Mild gastritis.                           -Pancreatogram not obtained. Recommendation:           - Watch for pancreatitis, bleeding, perforation,                            and cholangitis.                           - Clear liquid diet.                           - Continue present medications.                           - Continue antibiotics x total of 5 days. Can stop                            Flagyl.                           - Can obtain surgical consultation while inpatient                            ?Lap chole.                            - Follow results of CA 19?9/CEA/IgG4. I do believe                            she will require EUS as an outpatient for                            evaluation of PD stricture. I will run it by Dr.  Jacobs/Dr. Mansouraty                           - The findings and recommendations were discussed                            with the patient's family.  Antimicrobials: Anti-infectives (From admission, onward)   Start     Dose/Rate Route Frequency Ordered Stop   05/30/20 1615  cefTRIAXone (ROCEPHIN) 2 g in sodium chloride 0.9 % 100 mL IVPB        2 g 200 mL/hr over 30 Minutes Intravenous Every 24 hours 05/30/20 1516     05/30/20 1615  metroNIDAZOLE (FLAGYL) tablet 500 mg        500 mg Oral Every 8 hours 05/30/20 1516          Subjective: Seen And examined at bedside and she discomfort from her ERCP and was extremely somnolent and sedated.  She appeared comfortable and in no acute distress.  Daughter at bedside.  She is unable to provide a subjective history.  Objective: Vitals:   05/31/20 1205 05/31/20 1213 05/31/20 1223 05/31/20 1315  BP: (!) 181/72 (!) 175/58 (!) 174/55 (!) 153/82  Pulse: 74 63 61 64  Resp: 17 17 10 19   Temp: (!) 97.5 F (36.4 C)   97.7 F (36.5 C)  TempSrc: Temporal   Oral  SpO2: 100% 96% 94% 96%  Weight:      Height:        Intake/Output Summary (Last 24 hours) at 05/31/2020 1503 Last data filed at 05/31/2020 1159 Gross per 24 hour  Intake 1974.24 ml  Output --  Net 1974.24 ml   Filed Weights   05/30/20 1645 05/31/20 0912  Weight: 50.8 kg 50.8 kg   Examination: Physical Exam:  Constitutional: WN/WD elderly Caucasian jaundiced female Eyes: Lids and conjunctivae normal, sclerae anicteric  ENMT: External Ears, Nose appear normal. Neck: Appears normal, supple, no cervical masses, normal ROM, no appreciable thyromegaly; no JVD Respiratory: Diminished to auscultation bilaterally, no wheezing, rales, rhonchi or crackles.  Normal respiratory effort and patient is not tachypenic. No accessory muscle use.  Unlabored breathing Cardiovascular: RRR, no murmurs / rubs / gallops. S1 and S2 auscultated.  Minimal extremity edema Abdomen: Soft, non-tender, distended secondary body habitus bowel sounds positive.  GU: Deferred. Musculoskeletal: No clubbing / cyanosis of digits/nails. No joint deformity upper and lower extremities.  Skin: Patient has a yellowish hue because of her jaundice but has no appreciable rashes or lesions on limited skin evaluation. No induration; Warm and dry.  Neurologic: CN 2-12 grossly intact with no focal deficits. Romberg sign and cerebellar reflexes not assessed.  Psychiatric: Impaired judgment and insight.  She is somnolent and drowsy. Calm mood and appropriate affect.   Data Reviewed: I have personally reviewed following labs and imaging studies  CBC: Recent Labs  Lab 05/29/20 1932 05/30/20 0139 05/31/20 0043  WBC 17.8* 25.6* 12.2*  NEUTROABS 16.3* 23.7*  --   HGB 11.4* 10.7* 9.0*  HCT 35.0* 31.4* 27.5*  MCV 98.3 95.7 97.2  PLT 156 147* 010*   Basic Metabolic Panel: Recent Labs  Lab 05/29/20 1932 05/30/20 1655 05/31/20 0043  NA 139 140 141  K 4.3 4.4 4.0  CL 104 103 107  CO2 22 24 25   GLUCOSE 205* 157* 193*  BUN 37* 34* 31*  CREATININE 1.40* 1.76* 1.68*  CALCIUM 8.8* 8.7* 8.2*  GFR: Estimated Creatinine Clearance: 16.7 mL/min (A) (by C-G formula based on SCr of 1.68 mg/dL (H)). Liver Function Tests: Recent Labs  Lab 05/29/20 1932 05/30/20 0139 05/31/20 0043  AST 315* 219* 86*  ALT 235* 207* 119*  ALKPHOS 282* 270* 208*  BILITOT 3.3* 3.7* 3.1*  PROT 6.3* 6.2* 5.1*  ALBUMIN 3.2* 3.1* 2.4*   Recent Labs  Lab 05/29/20 2230  LIPASE 25   No results for input(s): AMMONIA in the last 168 hours. Coagulation Profile: No results for input(s): INR, PROTIME in the last 168 hours. Cardiac Enzymes: Recent Labs  Lab 05/30/20 1717  CKTOTAL 75   BNP (last 3  results) No results for input(s): PROBNP in the last 8760 hours. HbA1C: No results for input(s): HGBA1C in the last 72 hours. CBG: Recent Labs  Lab 05/30/20 1306 05/30/20 2105 05/31/20 0745 05/31/20 1316  GLUCAP 131* 158* 108* 152*   Lipid Profile: No results for input(s): CHOL, HDL, LDLCALC, TRIG, CHOLHDL, LDLDIRECT in the last 72 hours. Thyroid Function Tests: No results for input(s): TSH, T4TOTAL, FREET4, T3FREE, THYROIDAB in the last 72 hours. Anemia Panel: No results for input(s): VITAMINB12, FOLATE, FERRITIN, TIBC, IRON, RETICCTPCT in the last 72 hours. Sepsis Labs: Recent Labs  Lab 05/29/20 2135 05/30/20 0139 05/30/20 1516 05/30/20 1655  LATICACIDVEN 2.6* 2.2* 1.9 2.4*    Recent Results (from the past 240 hour(s))  Resp Panel by RT-PCR (Flu A&B, Covid) Nasopharyngeal Swab     Status: None   Collection Time: 05/29/20  9:49 PM   Specimen: Nasopharyngeal Swab; Nasopharyngeal(NP) swabs in vial transport medium  Result Value Ref Range Status   SARS Coronavirus 2 by RT PCR NEGATIVE NEGATIVE Final    Comment: (NOTE) SARS-CoV-2 target nucleic acids are NOT DETECTED.  The SARS-CoV-2 RNA is generally detectable in upper respiratory specimens during the acute phase of infection. The lowest concentration of SARS-CoV-2 viral copies this assay can detect is 138 copies/mL. A negative result does not preclude SARS-Cov-2 infection and should not be used as the sole basis for treatment or other patient management decisions. A negative result may occur with  improper specimen collection/handling, submission of specimen other than nasopharyngeal swab, presence of viral mutation(s) within the areas targeted by this assay, and inadequate number of viral copies(<138 copies/mL). A negative result must be combined with clinical observations, patient history, and epidemiological information. The expected result is Negative.  Fact Sheet for Patients:   EntrepreneurPulse.com.au  Fact Sheet for Healthcare Providers:  IncredibleEmployment.be  This test is no t yet approved or cleared by the [place] FDA and  has been authorized for detection and/or diagnosis of SARS-CoV-2 by FDA under an Emergency Use Authorization (EUA). This EUA will remain  in effect (meaning this test can be used) for the duration of the COVID-19 declaration under Section 564(b)(1) of the Act, 21 U.S.C.section 360bbb-3(b)(1), unless the authorization is terminated  or revoked sooner.       Influenza A by PCR NEGATIVE NEGATIVE Final   Influenza B by PCR NEGATIVE NEGATIVE Final    Comment: (NOTE) The Xpert Xpress SARS-CoV-2/FLU/RSV plus assay is intended as an aid in the diagnosis of influenza from Nasopharyngeal swab specimens and should not be used as a sole basis for treatment. Nasal washings and aspirates are unacceptable for Xpert Xpress SARS-CoV-2/FLU/RSV testing.  Fact Sheet for Patients: EntrepreneurPulse.com.au  Fact Sheet for Healthcare Providers: IncredibleEmployment.be  This test is not yet approved or cleared by the [place] FDA and has been authorized for detection  and/or diagnosis of SARS-CoV-2 by FDA under an Emergency Use Authorization (EUA). This EUA will remain in effect (meaning this test can be used) for the duration of the COVID-19 declaration under Section 564(b)(1) of the Act, 21 U.S.C. section 360bbb-3(b)(1), unless the authorization is terminated or revoked.  Performed at [facility], [address] [place], [place] [address]   Culture, blood (x 2)     Status: None (Preliminary result)   Collection Time: 05/30/20  5:00 PM   Specimen: BLOOD RIGHT ARM  Result Value Ref Range Status   Specimen Description BLOOD RIGHT ARM  Final   Special Requests   Final    BOTTLES DRAWN AEROBIC ONLY Blood Culture results may not be optimal due to an  inadequate volume of blood received in culture bottles   Culture   Final    NO GROWTH < 24 HOURS Performed at [facility] [address], [place], [place] [address]    Report Status PENDING  Incomplete  Culture, blood (x 2)     Status: None (Preliminary result)   Collection Time: 05/30/20  5:03 PM   Specimen: BLOOD RIGHT ARM  Result Value Ref Range Status   Specimen Description BLOOD RIGHT ARM  Final   Special Requests   Final    BOTTLES DRAWN AEROBIC ONLY Blood Culture results may not be optimal due to an inadequate volume of blood received in culture bottles   Culture   Final    NO GROWTH < 24 HOURS Performed at [facility], [place] [address], [place], [place] [address]    Report Status PENDING  Incomplete     RN Pressure Injury Documentation:     Estimated body mass index is 22.62 kg/m as calculated from the following:   Height as of this encounter: 4\' 11"  (1.499 m).   Weight as of this encounter: 50.8 kg.  Malnutrition Type:  Nutrition Problem: Inadequate oral intake Etiology: altered GI function   Malnutrition Characteristics:  Signs/Symptoms: NPO status   Nutrition Interventions:  Interventions: Refer to RD note for recommendations   Radiology Studies: CT Head Wo Contrast  Result Date: 05/29/2020 CLINICAL DATA:  Seizures.  Found on ground. EXAM: CT HEAD WITHOUT CONTRAST CT CERVICAL SPINE WITHOUT CONTRAST TECHNIQUE: Multidetector CT imaging of the head and cervical spine was performed following the standard protocol without intravenous contrast. Multiplanar CT image reconstructions of the cervical spine were also generated. COMPARISON:  Head CT 03/05/2019, head and neck CTA 03/05/2019, and head MRI 03/06/2019 FINDINGS: CT HEAD FINDINGS Brain: There is no evidence of an acute infarct, intracranial hemorrhage, mass, midline shift, or extra-axial fluid collection. Hypodensities in the cerebral white matter bilaterally are unchanged and nonspecific but  compatible with mild chronic small vessel ischemic disease. A chronic lacunar infarct is again noted in the right basal ganglia. Moderately advanced cerebral atrophy is unchanged. Vascular: Calcified atherosclerosis at the skull base. No hyperdense vessel. Skull: No fracture or suspicious osseous lesion. Sinuses/Orbits: Visualized paranasal sinuses and mastoid air cells are clear. Bilateral cataract extraction. Other: None. CT CERVICAL SPINE FINDINGS Alignment: Cervical spine straightening. Trace anterolisthesis of C4 on C5. Skull base and vertebrae: No acute fracture or suspicious osseous lesion. Moderate median C1-2 arthropathy. Soft tissues and spinal canal: No prevertebral fluid or swelling. No visible canal hematoma. Disc levels: Severe disc space narrowing at C5-6 and C6-7 with uncovertebral spurring resulting in only mild bilateral neural foraminal stenosis. Mild cervical facet arthrosis, most notable on the left at C4-5.  Upper chest: Clear lung apices. Other: Mild carotid atherosclerosis. IMPRESSION: 1. No evidence of acute intracranial abnormality. 2. Mild chronic small vessel ischemic disease and moderately advanced cerebral atrophy. 3. No acute cervical spine fracture. Electronically Signed   By: Logan Bores M.D.   On: 05/29/2020 20:13   CT Cervical Spine Wo Contrast  Result Date: 05/29/2020 CLINICAL DATA:  Seizures.  Found on ground. EXAM: CT HEAD WITHOUT CONTRAST CT CERVICAL SPINE WITHOUT CONTRAST TECHNIQUE: Multidetector CT imaging of the head and cervical spine was performed following the standard protocol without intravenous contrast. Multiplanar CT image reconstructions of the cervical spine were also generated. COMPARISON:  Head CT 03/05/2019, head and neck CTA 03/05/2019, and head MRI 03/06/2019 FINDINGS: CT HEAD FINDINGS Brain: There is no evidence of an acute infarct, intracranial hemorrhage, mass, midline shift, or extra-axial fluid collection. Hypodensities in the cerebral white matter  bilaterally are unchanged and nonspecific but compatible with mild chronic small vessel ischemic disease. A chronic lacunar infarct is again noted in the right basal ganglia. Moderately advanced cerebral atrophy is unchanged. Vascular: Calcified atherosclerosis at the skull base. No hyperdense vessel. Skull: No fracture or suspicious osseous lesion. Sinuses/Orbits: Visualized paranasal sinuses and mastoid air cells are clear. Bilateral cataract extraction. Other: None. CT CERVICAL SPINE FINDINGS Alignment: Cervical spine straightening. Trace anterolisthesis of C4 on C5. Skull base and vertebrae: No acute fracture or suspicious osseous lesion. Moderate median C1-2 arthropathy. Soft tissues and spinal canal: No prevertebral fluid or swelling. No visible canal hematoma. Disc levels: Severe disc space narrowing at C5-6 and C6-7 with uncovertebral spurring resulting in only mild bilateral neural foraminal stenosis. Mild cervical facet arthrosis, most notable on the left at C4-5. Upper chest: Clear lung apices. Other: Mild carotid atherosclerosis. IMPRESSION: 1. No evidence of acute intracranial abnormality. 2. Mild chronic small vessel ischemic disease and moderately advanced cerebral atrophy. 3. No acute cervical spine fracture. Electronically Signed   By: Logan Bores M.D.   On: 05/29/2020 20:13   CT Thoracic Spine Wo Contrast  Result Date: 05/29/2020 CLINICAL DATA:  Seizures.  Found on ground. EXAM: CT THORACIC AND LUMBAR SPINE WITHOUT CONTRAST TECHNIQUE: Multidetector CT imaging of the thoracic and lumbar spine was performed without contrast. Multiplanar CT image reconstructions were also generated. COMPARISON:  CT abdomen and pelvis 06/13/2009 FINDINGS: CT THORACIC SPINE FINDINGS Alignment: Mild thoracic dextroscoliosis.  No listhesis. Vertebrae: No acute fracture or suspicious osseous lesion. Paraspinal and other soft tissues: Aortic and coronary atherosclerosis. Disc levels: Mild-to-moderate multilevel disc  degeneration and moderate facet arthrosis in the thoracic spine. No evidence of high-grade spinal stenosis. Moderate left neural foraminal stenosis at T11-12 due to prominent facet spurring. CT LUMBAR SPINE FINDINGS Segmentation: 5 lumbar type vertebrae. Alignment: Mild lumbar levoscoliosis.  No listhesis. Vertebrae: No acute fracture or suspicious osseous lesion. Paraspinal and other soft tissues: Nonobstructing 1.5 cm left renal stone, much larger than in 2011. Partially visualized left renal cyst measuring at least 3.8 cm. Abdominal aortic atherosclerosis without aneurysm. Disc levels: Mild disc space narrowing at L2-3 with severe narrowing at L4-5. Disc bulging and posterior element hypertrophy result in severe spinal stenosis at L4-5 and mild spinal stenosis at L3-4 greater than L2-3. There is moderate multilevel neural foraminal stenosis. IMPRESSION: 1. No acute osseous abnormality identified in the thoracic or lumbar spine. 2. Thoracic and lumbar disc and facet degeneration as above. Severe spinal stenosis at L4-5. 3. Large nonobstructing left renal stone. 4. Aortic Atherosclerosis (ICD10-I70.0). Electronically Signed   By: Seymour Bars.D.  On: 05/29/2020 20:53   CT Lumbar Spine Wo Contrast  Result Date: 05/29/2020 CLINICAL DATA:  Seizures.  Found on ground. EXAM: CT THORACIC AND LUMBAR SPINE WITHOUT CONTRAST TECHNIQUE: Multidetector CT imaging of the thoracic and lumbar spine was performed without contrast. Multiplanar CT image reconstructions were also generated. COMPARISON:  CT abdomen and pelvis 06/13/2009 FINDINGS: CT THORACIC SPINE FINDINGS Alignment: Mild thoracic dextroscoliosis.  No listhesis. Vertebrae: No acute fracture or suspicious osseous lesion. Paraspinal and other soft tissues: Aortic and coronary atherosclerosis. Disc levels: Mild-to-moderate multilevel disc degeneration and moderate facet arthrosis in the thoracic spine. No evidence of high-grade spinal stenosis. Moderate left  neural foraminal stenosis at T11-12 due to prominent facet spurring. CT LUMBAR SPINE FINDINGS Segmentation: 5 lumbar type vertebrae. Alignment: Mild lumbar levoscoliosis.  No listhesis. Vertebrae: No acute fracture or suspicious osseous lesion. Paraspinal and other soft tissues: Nonobstructing 1.5 cm left renal stone, much larger than in 2011. Partially visualized left renal cyst measuring at least 3.8 cm. Abdominal aortic atherosclerosis without aneurysm. Disc levels: Mild disc space narrowing at L2-3 with severe narrowing at L4-5. Disc bulging and posterior element hypertrophy result in severe spinal stenosis at L4-5 and mild spinal stenosis at L3-4 greater than L2-3. There is moderate multilevel neural foraminal stenosis. IMPRESSION: 1. No acute osseous abnormality identified in the thoracic or lumbar spine. 2. Thoracic and lumbar disc and facet degeneration as above. Severe spinal stenosis at L4-5. 3. Large nonobstructing left renal stone. 4. Aortic Atherosclerosis (ICD10-I70.0). Electronically Signed   By: Logan Bores M.D.   On: 05/29/2020 20:53   MR 3D Recon At Scanner  Result Date: 05/30/2020 CLINICAL DATA:  Right upper quadrant abdominal pain. Cholelithiasis. EXAM: MRI ABDOMEN WITHOUT AND WITH CONTRAST (INCLUDING MRCP) TECHNIQUE: Multiplanar multisequence MR imaging of the abdomen was performed both before and after the administration of intravenous contrast. Heavily T2-weighted images of the biliary and pancreatic ducts were obtained, and three-dimensional MRCP images were rendered by post processing. CONTRAST:  7.71mL GADAVIST GADOBUTROL 1 MMOL/ML IV SOLN COMPARISON:  Ultrasound examination, same date. FINDINGS: Examination is limited due to respiratory motion. Lower chest: The lung bases are clear of an acute process. No pleural effusions or pericardial effusion. Hepatobiliary: No hepatic lesions are identified. No intrahepatic biliary dilatation. Layering dependent gallstones are noted in the  gallbladder but no MR findings suspicious for acute cholecystitis. No common bile duct dilatation. Suspect small common bile duct stones distally. Low insertion of the cystic. Pancreas: Significant pancreatic atrophy. This is most notable in the distal body/tail regions there is virtually no pancreatic tissue. Pancreatic duct is abruptly dilated at the midbody level no pancreatic mass is identified. This could be due to a ductal stricture. Spleen:  Normal size. No focal lesions. Adrenals/Urinary Tract:  The adrenal glands are unremarkable. There are simple renal cysts and a 10 mm left renal calculus. No worrisome renal lesions or hydronephrosis. Stomach/Bowel: The stomach, duodenum, visualized small bowel and visualized colon are grossly normal. Vascular/Lymphatic: Advanced atherosclerotic calcifications involving the aorta but aneurysm dissection. The branch vessels are patent. The major venous structures are patent. Other: No ascites or abdominal hernia. No obvious free air or free fluid. Musculoskeletal: No significant bony findings. IMPRESSION: 1. Examination is limited due to respiratory motion. 2. Cholelithiasis but no MR findings for acute cholecystitis. 3. Suspect small common bile duct stones distally. No common bile duct dilatation. 4. Significant pancreatic atrophy. There is also pancreatic duct dilatation in the mid body tail region with an abrupt transition but no  obvious obstructing lesion. Possible pancreatic stricture. CT abdomen with pancreatic protocol may be helpful for further evaluation. 5. Simple renal cysts and a 10 mm left renal calculus. Electronically Signed   By: Marijo Sanes M.D.   On: 05/30/2020 05:13   DG ERCP BILIARY & PANCREATIC DUCTS  Result Date: 05/31/2020 CLINICAL DATA:  ERCP. EXAM: ERCP TECHNIQUE: Multiple spot images obtained with the fluoroscopic device and submitted for interpretation post-procedure. COMPARISON:  MRCP-05/30/2020 FLUOROSCOPY TIME:  2 minutes, 33 seconds  (22.1 mGy) FINDINGS: Twelve spot intraoperative fluoroscopic images of the right upper abdominal quadrant during ERCP are provided for review Initial image demonstrates an ERCP probe overlying the right upper abdominal quadrant Subsequent images demonstrate selective cannulation and opacification of the CBD which appears moderately dilated. There is a lenticular for filling defect within the distal aspect of the CBD likely compatible with questioned choledocholithiasis on preceding MRCP. Subsequent images demonstrate plasty at the level of the biliary ampulla with subsequent biliary sweeping and sphincterotomy. There is minimal opacification the cystic duct and gallbladder lumen. There is minimal opacification of the intrahepatic biliary tree which appears nondilated. There is no definitive opacification of the pancreatic duct. IMPRESSION: ERCP with findings of choledocholithiasis, biliary plasty, sweeping and presumed sphincterotomy. These images were submitted for radiologic interpretation only. Please see the procedural report for the amount of contrast and the fluoroscopy time utilized. Electronically Signed   By: Sandi Mariscal M.D.   On: 05/31/2020 13:09   MR ABDOMEN MRCP W WO CONTAST  Result Date: 05/30/2020 CLINICAL DATA:  Right upper quadrant abdominal pain. Cholelithiasis. EXAM: MRI ABDOMEN WITHOUT AND WITH CONTRAST (INCLUDING MRCP) TECHNIQUE: Multiplanar multisequence MR imaging of the abdomen was performed both before and after the administration of intravenous contrast. Heavily T2-weighted images of the biliary and pancreatic ducts were obtained, and three-dimensional MRCP images were rendered by post processing. CONTRAST:  7.39mL GADAVIST GADOBUTROL 1 MMOL/ML IV SOLN COMPARISON:  Ultrasound examination, same date. FINDINGS: Examination is limited due to respiratory motion. Lower chest: The lung bases are clear of an acute process. No pleural effusions or pericardial effusion. Hepatobiliary: No hepatic  lesions are identified. No intrahepatic biliary dilatation. Layering dependent gallstones are noted in the gallbladder but no MR findings suspicious for acute cholecystitis. No common bile duct dilatation. Suspect small common bile duct stones distally. Low insertion of the cystic. Pancreas: Significant pancreatic atrophy. This is most notable in the distal body/tail regions there is virtually no pancreatic tissue. Pancreatic duct is abruptly dilated at the midbody level no pancreatic mass is identified. This could be due to a ductal stricture. Spleen:  Normal size. No focal lesions. Adrenals/Urinary Tract:  The adrenal glands are unremarkable. There are simple renal cysts and a 10 mm left renal calculus. No worrisome renal lesions or hydronephrosis. Stomach/Bowel: The stomach, duodenum, visualized small bowel and visualized colon are grossly normal. Vascular/Lymphatic: Advanced atherosclerotic calcifications involving the aorta but aneurysm dissection. The branch vessels are patent. The major venous structures are patent. Other: No ascites or abdominal hernia. No obvious free air or free fluid. Musculoskeletal: No significant bony findings. IMPRESSION: 1. Examination is limited due to respiratory motion. 2. Cholelithiasis but no MR findings for acute cholecystitis. 3. Suspect small common bile duct stones distally. No common bile duct dilatation. 4. Significant pancreatic atrophy. There is also pancreatic duct dilatation in the mid body tail region with an abrupt transition but no obvious obstructing lesion. Possible pancreatic stricture. CT abdomen with pancreatic protocol may be helpful for further evaluation. 5.  Simple renal cysts and a 10 mm left renal calculus. Electronically Signed   By: Marijo Sanes M.D.   On: 05/30/2020 05:13   US Abdomen Limited RUQ (LIVER/GB)  Result Date: 05/29/2020 CLINICAL DATA:  86 year old female with right upper quadrant abdominal pain. EXAM: ULTRASOUND ABDOMEN LIMITED RIGHT  UPPER QUADRANT COMPARISON:  Right upper quadrant ultrasound dated 06/13/2009. FINDINGS: Gallbladder: There is sludge and small stones within the gallbladder. There is no gallbladder wall thickening or pericholecystic fluid. Negative sonographic Murphy sign. Common bile duct: Diameter: 5 mm Liver: Coarsened liver echotexture with slight surface nodularity consistent morphologic changes of cirrhosis. Portal vein is patent on color Doppler imaging with normal direction of blood flow towards the liver. Other: None. IMPRESSION: 1. Cholelithiasis without sonographic evidence of acute cholecystitis. 2. Cirrhosis. 3. Patent main portal vein with hepatopetal flow. Electronically Signed   By: Anner Crete M.D.   On: 05/29/2020 22:19   Scheduled Meds: . aspirin EC  81 mg Oral Daily  . carvedilol  12.5 mg Oral BID  . insulin glargine  8 Units Subcutaneous QHS  . levETIRAcetam  250 mg Oral BID  . metroNIDAZOLE  500 mg Oral Q8H  . ramipril  5 mg Oral Daily  . sodium chloride flush  3 mL Intravenous Q12H   Continuous Infusions: . sodium chloride 500 mL/hr at 05/31/20 1001  . cefTRIAXone (ROCEPHIN)  IV 100 mL/hr at 05/31/20 0329    LOS: 1 day   Kerney Elbe, DO [facility] PAGER is on [place]  If 7PM-7AM, please contact night-coverage [email or web address]

## 2020-05-31 NOTE — Progress Notes (Signed)
POST ERCP NOTE:  Pt doing well. No abdominal pain or nausea or melena.  Tolerating clear liquids. vitals stable. Abdominal Exam: soft, nontender.   They are willing to proceed with lap chole in AM as suggested by surgery. Follow results of CA 19-9, CEA, IgG-4. Likely EUS as an outpt for PD stricture (will expedite if CA 19-9 is elevated)  D/W pt's daughter.     Carmell Austria MD

## 2020-05-31 NOTE — Op Note (Addendum)
[facility] Patient Name: Taylor Castaneda Procedure Date : 05/31/2020 MRN: [id number] Attending MD: Jackquline Denmark , MD Date of Birth: 01-04-1933 CSN: [id number] Age: 86 Admit Type: Inpatient Procedure:                ERCP Indications:              Common bile duct stone(s) Providers:                Jackquline Denmark, MD, Josie Dixon, RN, Benetta Spar, Technician, Elmer Sow Referring MD:              Medicines:                General Anesthesia. She is already on IV Rocephin.                            Intra-Op Indocin suppositories were given. Complications:            No immediate complications. The cautery unit did                            malfunction prolonging the procedure. Estimated Blood Loss:     Estimated blood loss: none. Procedure:                Pre-Anesthesia Assessment:                           - Prior to the procedure, a History and Physical                            was performed, and patient medications and                            allergies were reviewed. The patient's tolerance of                            previous anesthesia was also reviewed. The risks                            and benefits of the procedure and the sedation                            options and risks were discussed with the patient.                            All questions were answered, and informed consent                            was obtained. Prior Anticoagulants: The patient has                            taken no previous anticoagulant or antiplatelet  agents. ASA Grade Assessment: III - A patient with                            severe systemic disease. After reviewing the risks                            and benefits, the patient was deemed in                            satisfactory condition to undergo the procedure.                           After obtaining informed consent, the scope was                             passed under direct vision. Throughout the                            procedure, the patient's blood pressure, pulse, and                            oxygen saturations were monitored continuously. The                            [id number]-Q180V ([id number] Olympus duodenoscope was                            introduced through the mouth, and used to inject                            contrast into and used to inject contrast into the                            bile duct. The ERCP was accomplished without                            difficulty. The patient tolerated the procedure                            well. Scope In: Scope Out: Findings:      The scout film was normal. The upper GI tract was traversed under direct       vision without detailed examination. Mild antral gastritis was noted.      The major papilla was located at lower lip of periampullary diverticula.       The bile duct was deeply cannulated with the short-nosed traction       sphincterotome. Contrast was injected. I personally interpreted the bile       duct images. There was brisk flow of contrast through the ducts. Image       quality was adequate. Contrast extended to the entire biliary tree. 3       filling defects c/w choledocholithiasis was found in CBD which was       mildly dilated to 10 mm. Low insertion of cystic duct was noted. Cystic  duct did fill. Multiple filling defects in the gallbladder consistent       with cholelithiasis.      A 6 mm biliary sphincterotomy was made with a monofilament traction       (standard) sphincterotome using ERBE electrocautery at 12 o'clock       position. There was no post-sphincterotomy bleeding. Due to       periampullary diverticula, we elected to proceed with sphincteroplasty.       Sphincteroplasty was performed using 03-15-11 mm x 5.5 cm CRE balloon to       a maximum balloon size of 10 mm. The biliary tree was swept multiple       times with a 12 mm balloon starting at  the bifurcation. All 3 stones       were removed. Nothing was found on postocclusion cholangiogram. The bile       was green. There was no pus.      Pancreatogram was not obtained. We did try few times but the wire would       not find pancreatic duct after the sphincterotomy.      Mild antral gastritis was biopsied with a cold forceps for histology to       r/o HP. Impression:               -Periampullary diverticulum.                           -Choledocholithiasis s/p biliary sphincterotomy,                            sphincteroplasty and balloon extraction.                           -Cholelithiasis without cholecystitis.                           -Mild gastritis.                           -Pancreatogram not obtained. Recommendation:           - Watch for pancreatitis, bleeding, perforation,                            and cholangitis.                           - Clear liquid diet.                           - Continue present medications.                           - Continue antibiotics x total of 5 days. Can stop                            Flagyl.                           - Can obtain surgical consultation while inpatient                            ?  Lap chole.                           - Follow results of CA 19?9/CEA/IgG4. I do believe                            she will require EUS as an outpatient for                            evaluation of PD stricture. I will run it by Dr.                            Wendie Agreste. Mansouraty                           - The findings and recommendations were discussed                            with the patient's family. Procedure Code(s):        --- Professional ---                           [phone], 71, Endoscopic retrograde                            cholangiopancreatography (ERCP); with                            trans-endoscopic balloon dilation of                            biliary/pancreatic duct(s) or of ampulla                             (sphincteroplasty), including sphincterotomy, when                            performed, each duct                           43264, Endoscopic retrograde                            cholangiopancreatography (ERCP); with removal of                            calculi/debris from biliary/pancreatic duct(s)                           43261, 59, Endoscopic retrograde                            cholangiopancreatography (ERCP); with biopsy,                            single or multiple  74328, Endoscopic catheterization of the biliary                            ductal system, radiological supervision and                            interpretation Diagnosis Code(s):        --- Professional ---                           K80.50, Calculus of bile duct without cholangitis                            or cholecystitis without obstruction                           K83.8, Other specified diseases of biliary tract CPT copyright 2019 [employer]. All rights reserved. The codes documented in this report are preliminary and upon coder review may  be revised to meet current compliance requirements. Jackquline Denmark, MD 05/31/2020 12:06:49 PM This report has been signed electronically. Number of Addenda: 0

## 2020-05-31 NOTE — Plan of Care (Signed)
  Problem: Nutrition Goal: Nutritional status is improving Description: Monitor and assess patient for malnutrition (ex- brittle hair, bruises, dry skin, pale skin and conjunctiva, muscle wasting, smooth red tongue, and disorientation). Collaborate with interdisciplinary team and initiate plan and interventions as ordered.  Monitor patient's weight and dietary intake as ordered or per policy. Utilize nutrition screening tool and intervene per policy. Determine patient's food preferences and provide high-protein, high-caloric foods as appropriate.  Outcome: Progressing   Problem: Education: Goal: Knowledge of General Education information will improve Description: Including pain rating scale, medication(s)/side effects and non-pharmacologic comfort measures Outcome: Progressing   Problem: Health Behavior/Discharge Planning: Goal: Ability to manage health-related needs will improve Outcome: Progressing   Problem: Clinical Measurements: Goal: Ability to maintain clinical measurements within normal limits will improve Outcome: Progressing Goal: Will remain free from infection Outcome: Progressing Goal: Diagnostic test results will improve Outcome: Progressing Goal: Respiratory complications will improve Outcome: Progressing Goal: Cardiovascular complication will be avoided Outcome: Progressing   Problem: Activity: Goal: Risk for activity intolerance will decrease Outcome: Progressing   Problem: Nutrition: Goal: Adequate nutrition will be maintained Outcome: Progressing   Problem: Coping: Goal: Level of anxiety will decrease Outcome: Progressing   Problem: Elimination: Goal: Will not experience complications related to bowel motility Outcome: Progressing Goal: Will not experience complications related to urinary retention Outcome: Progressing   Problem: Pain Managment: Goal: General experience of comfort will improve Outcome: Progressing   Problem: Safety: Goal: Ability to  remain free from injury will improve Outcome: Progressing   Problem: Skin Integrity: Goal: Risk for impaired skin integrity will decrease Outcome: Progressing

## 2020-05-31 NOTE — Progress Notes (Signed)
Patient is still asleep since coming up on the floor. Agreed with family in the room that we will wait until patient is awake to take medications.

## 2020-05-31 NOTE — Transfer of Care (Signed)
Immediate Anesthesia Transfer of Care Note  Patient: ELAH AVELLINO  Procedure(s) Performed: ENDOSCOPIC RETROGRADE CHOLANGIOPANCREATOGRAPHY (ERCP) WITH PROPOFOL (N/A ) SPHINCTEROTOMY REMOVAL OF STONES BILIARY DILATION  Patient Location: PACU  Anesthesia Type:General  Level of Consciousness: drowsy  Airway & Oxygen Therapy: Patient Spontanous Breathing and Patient connected to face mask oxygen  Post-op Assessment: Report given to RN and Post -op Vital signs reviewed and stable  Post vital signs: Reviewed and stable  Last Vitals:  Vitals Value Taken Time  BP 181/72 05/31/20 1205  Temp 36.4 C 05/31/20 1205  Pulse 64 05/31/20 1210  Resp 17 05/31/20 1210  SpO2 98 % 05/31/20 1210  Vitals shown include unvalidated device data.  Last Pain:  Vitals:   05/31/20 1205  TempSrc: Temporal  PainSc: Asleep         Complications: No complications documented.

## 2020-05-31 NOTE — Anesthesia Postprocedure Evaluation (Signed)
Anesthesia Post Note  Patient: Taylor Castaneda  Procedure(s) Performed: ENDOSCOPIC RETROGRADE CHOLANGIOPANCREATOGRAPHY (ERCP) WITH PROPOFOL (N/A ) SPHINCTEROTOMY REMOVAL OF STONES BILIARY DILATION BIOPSY     Patient location during evaluation: Endoscopy Anesthesia Type: General Level of consciousness: awake and alert Pain management: pain level controlled Vital Signs Assessment: post-procedure vital signs reviewed and stable Respiratory status: spontaneous breathing, nonlabored ventilation and respiratory function stable Cardiovascular status: blood pressure returned to baseline and stable Postop Assessment: no apparent nausea or vomiting Anesthetic complications: no   No complications documented.  Last Vitals:  Vitals:   05/31/20 1213 05/31/20 1223  BP: (!) 175/58 (!) 174/55  Pulse: 63 61  Resp: 17 10  Temp:    SpO2: 96% 94%    Last Pain:  Vitals:   05/31/20 1223  TempSrc:   PainSc: 0-No pain                 Lidia Collum

## 2020-05-31 NOTE — Anesthesia Procedure Notes (Signed)
Procedure Name: Intubation Date/Time: 05/31/2020 10:20 AM Performed by: Bryson Corona, CRNA Pre-anesthesia Checklist: Patient identified, Emergency Drugs available, Suction available and Patient being monitored Patient Re-evaluated:Patient Re-evaluated prior to induction Oxygen Delivery Method: Circle System Utilized Preoxygenation: Pre-oxygenation with 100% oxygen Induction Type: IV induction Ventilation: Mask ventilation without difficulty Laryngoscope Size: Mac and 3 Grade View: Grade I Tube type: Oral Tube size: 7.0 mm Number of attempts: 1 Airway Equipment and Method: Stylet Placement Confirmation: ETT inserted through vocal cords under direct vision,  positive ETCO2 and breath sounds checked- equal and bilateral Secured at: 21 cm Tube secured with: Tape Dental Injury: Teeth and Oropharynx as per pre-operative assessment

## 2020-05-31 NOTE — Consult Note (Addendum)
[facility] Surgery Consult Note  SYNDEY Castaneda 09-20-1932  [id number].    Requesting MD: Raiford Noble Chief Complaint/Reason for Consult: choledocholithiasis  HPI:  Taylor Castaneda is an 86yo female PMH HTN, HLD, DM, transient atrial fibrillation, seizure disorder, and CKD who presented to [facility] 12/26 complaining of epigastric pain and generally feeling poor. Her daughter found her on the floor of her apartment, and when EMS arrived she had a witnessed generalized tonic-clonic seizure with urinary incontinence lasting approximately 1 minute prior to self resolving.  She had developed epigastric abdominal pain that radiated into her back earlier in the day. She tried taking Tums and Tylenol but this did not help. In the ED she underwent abdominal u/s which shows Cholelithiasis without sonographic evidence of acute cholecystitis. WBC 17.8, lactic acid 4.0>>1.9. LFTs elevated with AST 315, ALT 235, Alk phos 282, Tbili 3.3. Lipase 25. MRCP was obtained and revealed cholelithiasis without cholecystitis; suspect small common bile duct stones distally; pancreatic duct dilatation in the mid body tail region with an abrupt transition but no obvious obstructing lesion.  She was transferred to [facility] for GI evaluation. GI was consulted and took the patient for ERCP today. Choledocholithiasis found s/p biliary sphincterotomy, sphincteroplasty and balloon extraction; cholelithiasis without cholecystitis, no pus was found; pancreatogram not obtained. General surgery asked to see to discuss possible cholecystectomy.  Abdominal surgical history: appendectomy, colectomy Anticoagulants: none Former smoker, quit in 1975 Denies alcohol or illicit drug use Lives at home alone but has family close by and has life alert  ROS  All systems reviewed and otherwise negative except for as above  Family History  Problem Relation Age of Onset  . Heart attack Father 57  . Stroke Father 32  . Diabetes Father   .  Hypertension Father   . Heart disease Father        before age 9  . Breast cancer Mother   . Cancer Mother   . Diabetes Sister   . Cancer Sister   . Peripheral vascular disease Sister   . Breast cancer Sister   . Hypertension Son   . COPD Neg Hx   . Asthma Neg Hx   . Colon cancer Neg Hx   . Esophageal cancer Neg Hx   . Rectal cancer Neg Hx   . Stomach cancer Neg Hx     Past Medical History:  Diagnosis Date  . Anemia   . Diabetes mellitus, type II ([place])   . Glaucoma    Dr.Hecker  . History of colon cancer 1998   Dr. Lennie Hummer  . Hyperlipemia   . Osteopenia 02/2012, 03/2014   DEXA hip -2.2  . Renal insufficiency   . Seizures ([place])   . Stenosing tenosynovitis of finger of left hand 2016   index - s/p steroid injection x2    Past Surgical History:  Procedure Laterality Date  . ABI  11/2013   WNL  . APPENDECTOMY    . CARPAL TUNNEL RELEASE     Bilateral   . CATARACT EXTRACTION Bilateral 2006   Implants  . COLECTOMY  1998   For cancer  . COLONOSCOPY  2008   Dr.Stark, Due 2011  . COLONOSCOPY  06/2013   1 hyperplastic polyp, ileocecal anastomosis [facility])  . dexa  02/2012, 03/2014   T score -2.2 at hip overall stable  . REFRACTIVE SURGERY    . TONSILLECTOMY AND ADENOIDECTOMY    . TOTAL KNEE ARTHROPLASTY Left 12/17/2016   Procedure: LEFT TOTAL KNEE  ARTHROPLASTY;  Surgeon: Gaynelle Arabian, MD;  Location: [facility] ORS;  Service: Orthopedics;  Laterality: Left;    Social History:  reports that she quit smoking about 47 years ago. Her smoking use included cigarettes. She quit after 2.00 years of use. She has never used smokeless tobacco. She reports previous alcohol use. She reports that she does not use drugs.  Allergies:  Allergies  Allergen Reactions  . Gabapentin Other (See Comments)    Dizziness  . Pravastatin Sodium Other (See Comments)    "Anemia," per Dr Gilford Rile, [facility]    Medications Prior to Admission  Medication Sig Dispense Refill  . acetaminophen (TYLENOL)  325 MG tablet Take 325-650 mg by mouth every 6 (six) hours as needed (for pain).    Marland Kitchen aspirin 81 MG EC tablet Take 1 tablet (81 mg total) by mouth daily. Swallow whole. 30 tablet 0  . atorvastatin (LIPITOR) 40 MG tablet Take 1 tablet (40 mg total) by mouth daily at 6 PM. 90 tablet 3  . Biotin 10000 MCG TABS Take 10,000 mcg by mouth daily.    . Carboxymethylcellul-Glycerin 1-0.25 % SOLN Place 1-2 drops into both eyes 3 (three) times daily as needed (for dry/irritated eyes.).    Marland Kitchen carvedilol (COREG) 12.5 MG tablet Take 1 tablet (12.5 mg total) by mouth 2 (two) times daily. 180 tablet 3  . Cholecalciferol (VITAMIN D3) 25 MCG (1000 UT) CAPS Take 1 capsule (1,000 Units total) by mouth daily. (Patient taking differently: Take 2,000 Units by mouth daily.) 30 capsule   . Cinnamon 500 MG capsule Take 1,000 mg by mouth daily.    . colchicine 0.6 MG tablet Take 1 tablet (0.6 mg total) by mouth daily as needed (gout flare). First day of gout flare, may take 1 tablet twice daily. (Patient taking differently: Take 0.6 mg by mouth daily as needed (for gout flares, and may take 0.6 mg TWICE DAILY on 1st day of flare).) 30 tablet 3  . Ferrous Sulfate (IRON) 325 (65 Fe) MG TABS Take 325 mg by mouth See admin instructions. Take 325 mg by mouth in the evening on Mondays and Thursdays only    . furosemide (LASIX) 20 MG tablet Take 1 tablet by mouth once daily (Patient taking differently: Take 20 mg by mouth in the morning.) 90 tablet 3  . HUMALOG KWIKPEN 100 UNIT/ML KiwkPen Inject 1-10 Units into the skin See admin instructions. Inject 1-10 units into the skin three times a day with meals, PER SLIDING SCALE  4  . LANTUS SOLOSTAR 100 UNIT/ML Solostar Pen Inject 16 Units into the skin at bedtime.    . levETIRAcetam (KEPPRA) 250 MG tablet Take 1 tablet by mouth twice daily (Patient taking differently: Take 250 mg by mouth 2 (two) times daily.) 180 tablet 1  . Multiple Vitamins-Minerals (PRESERVISION AREDS 2 PO) Take 1 capsule  by mouth 2 (two) times daily.    . potassium chloride (KLOR-CON) 10 MEQ tablet TAKE 1 TABLET BY MOUTH ON MONDAY, [facility] AND FRIDAY (Patient taking differently: Take 10 mEq by mouth every Monday, Wednesday, and Friday.) 36 tablet 3  . ramipril (ALTACE) 5 MG capsule Take 1 capsule by mouth once daily (Patient taking differently: Take 5 mg by mouth daily.) 90 capsule 3  . B-D ULTRAFINE III SHORT PEN 31G X 8 MM MISC USE AS DIRECTED WITH INSULIN 100 each 3    Prior to Admission medications   Medication Sig Start Date End Date Taking? Authorizing Provider  acetaminophen (TYLENOL) 325 MG tablet  Take 325-650 mg by mouth every 6 (six) hours as needed (for pain).   Yes [provider]  aspirin 81 MG EC tablet Take 1 tablet (81 mg total) by mouth daily. Swallow whole. 03/07/19  Yes Mariel Aloe, MD  atorvastatin (LIPITOR) 40 MG tablet Take 1 tablet (40 mg total) by mouth daily at 6 PM. 02/19/20  Yes [name], MD  Biotin 10000 MCG TABS Take 10,000 mcg by mouth daily.   Yes [provider]  Carboxymethylcellul-Glycerin 1-0.25 % SOLN Place 1-2 drops into both eyes 3 (three) times daily as needed (for dry/irritated eyes.).   Yes [provider]  carvedilol (COREG) 12.5 MG tablet Take 1 tablet (12.5 mg total) by mouth 2 (two) times daily. 02/19/20  Yes [name], MD  Cholecalciferol (VITAMIN D3) 25 MCG (1000 UT) CAPS Take 1 capsule (1,000 Units total) by mouth daily. Patient taking differently: Take 2,000 Units by mouth daily. 03/18/19  Yes [name], MD  Cinnamon 500 MG capsule Take 1,000 mg by mouth daily.   Yes [provider]  colchicine 0.6 MG tablet Take 1 tablet (0.6 mg total) by mouth daily as needed (gout flare). First day of gout flare, may take 1 tablet twice daily. Patient taking differently: Take 0.6 mg by mouth daily as needed (for gout flares, and may take 0.6 mg TWICE DAILY on 1st day of flare). 02/19/20  Yes [name], MD   Ferrous Sulfate (IRON) 325 (65 Fe) MG TABS Take 325 mg by mouth See admin instructions. Take 325 mg by mouth in the evening on Mondays and Thursdays only   Yes [provider]  furosemide (LASIX) 20 MG tablet Take 1 tablet by mouth once daily Patient taking differently: Take 20 mg by mouth in the morning. 05/13/20  Yes [name], MD  HUMALOG KWIKPEN 100 UNIT/ML KiwkPen Inject 1-10 Units into the skin See admin instructions. Inject 1-10 units into the skin three times a day with meals, PER SLIDING SCALE 10/04/16  Yes [provider]  LANTUS SOLOSTAR 100 UNIT/ML Solostar Pen Inject 16 Units into the skin at bedtime. 08/19/19  Yes [name], MD  levETIRAcetam (KEPPRA) 250 MG tablet Take 1 tablet by mouth twice daily Patient taking differently: Take 250 mg by mouth 2 (two) times daily. 03/25/20  Yes Lomax, Amy, NP  Multiple Vitamins-Minerals (PRESERVISION AREDS 2 PO) Take 1 capsule by mouth 2 (two) times daily.   Yes [provider]  potassium chloride (KLOR-CON) 10 MEQ tablet TAKE 1 TABLET BY MOUTH ON MONDAY, [place] Patient taking differently: Take 10 mEq by mouth every Monday, Wednesday, and Friday. 04/20/20  Yes [name], MD  ramipril (ALTACE) 5 MG capsule Take 1 capsule by mouth once daily Patient taking differently: Take 5 mg by mouth daily. 05/13/20  Yes [name], MD  B-D ULTRAFINE III SHORT PEN 31G X 8 MM MISC USE AS DIRECTED WITH INSULIN 07/09/11   Hendricks Limes, MD    Blood pressure (!) 153/82, pulse 64, temperature 97.7 F (36.5 C), temperature source Oral, resp. rate 19, height _0  (1.499 m), weight 50.8 kg, SpO2 96 %. Physical Exam: General: pleasant, elderly female who is laying in bed in NAD HEENT: head is normocephalic, atraumatic.  Sclera are noninjected.  PERRL.  Ears and nose without any masses or lesions.  Mouth is pink and moist. Dentures have been removed and currently has no teeth Heart: regular,  rate, and rhythm.  Normal s1,s2. No obvious murmurs,  gallops, or rubs noted.  Palpable pedal pulses bilaterally  Lungs: CTAB, no wheezes, rhonchi, or rales noted.  Respiratory effort nonlabored Abd: soft, minimally tender in epigastrium, ND, +BS, no masses, hernias, or organomegaly MS: all 4 extremities are symmetrical with no cyanosis, clubbing, or edema Skin: warm and dry with no masses, lesions, or rashes Psych: A&Ox4 with an appropriate affect Neuro: cranial nerves grossly intact, normal sensation throughout  Results for orders placed or performed during the hospital encounter of 05/30/20 (from the past 48 hour(s))  Lactic acid, plasma     Status: None   Collection Time: 05/30/20  3:16 PM  Result Value Ref Range   Lactic Acid, Venous 1.9 0.5 - 1.9 mmol/L    Comment: Performed at [facility], [address], [place], [place] [address]  Lactic acid, plasma     Status: Abnormal   Collection Time: 05/30/20  4:55 PM  Result Value Ref Range   Lactic Acid, Venous 2.4 (HH) 0.5 - 1.9 mmol/L    Comment: CRITICAL RESULT CALLED TO, READ BACK BY AND VERIFIED WITH: Hilda Lias [id number] 05/30/2020 WBOND Performed at [facility], [place] [address] [place], [place] [address]   Basic metabolic panel     Status: Abnormal   Collection Time: 05/30/20  4:55 PM  Result Value Ref Range   Sodium 140 135 - 145 mmol/L   Potassium 4.4 3.5 - 5.1 mmol/L   Chloride 103 98 - 111 mmol/L   CO2 24 22 - 32 mmol/L   Glucose, Bld 157 (H) 70 - 99 mg/dL    Comment: Glucose reference range applies only to samples taken after fasting for at least 8 hours.   BUN 34 (H) 8 - 23 mg/dL   Creatinine, Ser 1.76 (H) 0.44 - 1.00 mg/dL   Calcium 8.7 (L) 8.9 - 10.3 mg/dL   GFR, Estimated 28 (L) >60 mL/min    Comment: (NOTE) Calculated using the CKD-EPI Creatinine Equation (2021)    Anion gap 13 5 - 15    Comment: Performed at [facility] [address], [place], [place] [address]  Culture, blood (x 2)      Status: None (Preliminary result)   Collection Time: 05/30/20  5:00 PM   Specimen: BLOOD RIGHT ARM  Result Value Ref Range   Specimen Description BLOOD RIGHT ARM    Special Requests      BOTTLES DRAWN AEROBIC ONLY Blood Culture results may not be optimal due to an inadequate volume of blood received in culture bottles   Culture      NO GROWTH < 24 HOURS Performed at [facility] [address], [place], [place] [address]    Report Status PENDING   Culture, blood (x 2)     Status: None (Preliminary result)   Collection Time: 05/30/20  5:03 PM   Specimen: BLOOD RIGHT ARM  Result Value Ref Range   Specimen Description BLOOD RIGHT ARM    Special Requests      BOTTLES DRAWN AEROBIC ONLY Blood Culture results may not be optimal due to an inadequate volume of blood received in culture bottles   Culture      NO GROWTH < 24 HOURS Performed at [facility] [address], [place], [place] [address]    Report Status PENDING   CK     Status: None   Collection Time: 05/30/20  5:17 PM  Result Value Ref Range   Total CK 75 38 -  234 U/L    Comment: Performed at [facility], [place] [address] [place], [place] [address]  Glucose, capillary     Status: Abnormal   Collection Time: 05/30/20  9:05 PM  Result Value Ref Range   Glucose-Capillary 158 (H) 70 - 99 mg/dL    Comment: Glucose reference range applies only to samples taken after fasting for at least 8 hours.  CBC     Status: Abnormal   Collection Time: 05/31/20 12:43 AM  Result Value Ref Range   WBC 12.2 (H) 4.0 - 10.5 K/uL   RBC 2.83 (L) 3.87 - 5.11 MIL/uL   Hemoglobin 9.0 (L) 12.0 - 15.0 g/dL   HCT 27.5 (L) 36.0 - 46.0 %   MCV 97.2 80.0 - 100.0 fL   MCH 31.8 26.0 - 34.0 pg   MCHC 32.7 30.0 - 36.0 g/dL   RDW 12.7 11.5 - 15.5 %   Platelets 121 (L) 150 - 400 K/uL    Comment: REPEATED TO VERIFY   nRBC 0.0 0.0 - 0.2 %    Comment: Performed at [facility], [place] [address] [place], [place] [address]   Comprehensive metabolic panel     Status: Abnormal   Collection Time: 05/31/20 12:43 AM  Result Value Ref Range   Sodium 141 135 - 145 mmol/L   Potassium 4.0 3.5 - 5.1 mmol/L   Chloride 107 98 - 111 mmol/L   CO2 25 22 - 32 mmol/L   Glucose, Bld 193 (H) 70 - 99 mg/dL    Comment: Glucose reference range applies only to samples taken after fasting for at least 8 hours.   BUN 31 (H) 8 - 23 mg/dL   Creatinine, Ser 1.68 (H) 0.44 - 1.00 mg/dL   Calcium 8.2 (L) 8.9 - 10.3 mg/dL   Total Protein 5.1 (L) 6.5 - 8.1 g/dL   Albumin 2.4 (L) 3.5 - 5.0 g/dL   AST 86 (H) 15 - 41 U/L   ALT 119 (H) 0 - 44 U/L   Alkaline Phosphatase 208 (H) 38 - 126 U/L   Total Bilirubin 3.1 (H) 0.3 - 1.2 mg/dL   GFR, Estimated 30 (L) >60 mL/min    Comment: (NOTE) Calculated using the CKD-EPI Creatinine Equation (2021)    Anion gap 9 5 - 15    Comment: Performed at [facility], [place] [address], [place], [place] [address]  Glucose, capillary     Status: Abnormal   Collection Time: 05/31/20  7:45 AM  Result Value Ref Range   Glucose-Capillary 108 (H) 70 - 99 mg/dL    Comment: Glucose reference range applies only to samples taken after fasting for at least 8 hours.  Glucose, capillary     Status: Abnormal   Collection Time: 05/31/20  1:16 PM  Result Value Ref Range   Glucose-Capillary 152 (H) 70 - 99 mg/dL    Comment: Glucose reference range applies only to samples taken after fasting for at least 8 hours.   CT Head Wo Contrast  Result Date: 05/29/2020 CLINICAL DATA:  Seizures.  Found on ground. EXAM: CT HEAD WITHOUT CONTRAST CT CERVICAL SPINE WITHOUT CONTRAST TECHNIQUE: Multidetector CT imaging of the head and cervical spine was performed following the standard protocol without intravenous contrast. Multiplanar CT image reconstructions of the cervical spine were also generated. COMPARISON:  Head CT 03/05/2019, head and neck CTA 03/05/2019, and head MRI 03/06/2019 FINDINGS: CT HEAD FINDINGS Brain: There is no  evidence of an acute infarct, intracranial  hemorrhage, mass, midline shift, or extra-axial fluid collection. Hypodensities in the cerebral white matter bilaterally are unchanged and nonspecific but compatible with mild chronic small vessel ischemic disease. A chronic lacunar infarct is again noted in the right basal ganglia. Moderately advanced cerebral atrophy is unchanged. Vascular: Calcified atherosclerosis at the skull base. No hyperdense vessel. Skull: No fracture or suspicious osseous lesion. Sinuses/Orbits: Visualized paranasal sinuses and mastoid air cells are clear. Bilateral cataract extraction. Other: None. CT CERVICAL SPINE FINDINGS Alignment: Cervical spine straightening. Trace anterolisthesis of C4 on C5. Skull base and vertebrae: No acute fracture or suspicious osseous lesion. Moderate median C1-2 arthropathy. Soft tissues and spinal canal: No prevertebral fluid or swelling. No visible canal hematoma. Disc levels: Severe disc space narrowing at C5-6 and C6-7 with uncovertebral spurring resulting in only mild bilateral neural foraminal stenosis. Mild cervical facet arthrosis, most notable on the left at C4-5. Upper chest: Clear lung apices. Other: Mild carotid atherosclerosis. IMPRESSION: 1. No evidence of acute intracranial abnormality. 2. Mild chronic small vessel ischemic disease and moderately advanced cerebral atrophy. 3. No acute cervical spine fracture. Electronically Signed   By: Logan Bores M.D.   On: 05/29/2020 20:13   CT Cervical Spine Wo Contrast  Result Date: 05/29/2020 CLINICAL DATA:  Seizures.  Found on ground. EXAM: CT HEAD WITHOUT CONTRAST CT CERVICAL SPINE WITHOUT CONTRAST TECHNIQUE: Multidetector CT imaging of the head and cervical spine was performed following the standard protocol without intravenous contrast. Multiplanar CT image reconstructions of the cervical spine were also generated. COMPARISON:  Head CT 03/05/2019, head and neck CTA 03/05/2019, and head MRI 03/06/2019  FINDINGS: CT HEAD FINDINGS Brain: There is no evidence of an acute infarct, intracranial hemorrhage, mass, midline shift, or extra-axial fluid collection. Hypodensities in the cerebral white matter bilaterally are unchanged and nonspecific but compatible with mild chronic small vessel ischemic disease. A chronic lacunar infarct is again noted in the right basal ganglia. Moderately advanced cerebral atrophy is unchanged. Vascular: Calcified atherosclerosis at the skull base. No hyperdense vessel. Skull: No fracture or suspicious osseous lesion. Sinuses/Orbits: Visualized paranasal sinuses and mastoid air cells are clear. Bilateral cataract extraction. Other: None. CT CERVICAL SPINE FINDINGS Alignment: Cervical spine straightening. Trace anterolisthesis of C4 on C5. Skull base and vertebrae: No acute fracture or suspicious osseous lesion. Moderate median C1-2 arthropathy. Soft tissues and spinal canal: No prevertebral fluid or swelling. No visible canal hematoma. Disc levels: Severe disc space narrowing at C5-6 and C6-7 with uncovertebral spurring resulting in only mild bilateral neural foraminal stenosis. Mild cervical facet arthrosis, most notable on the left at C4-5. Upper chest: Clear lung apices. Other: Mild carotid atherosclerosis. IMPRESSION: 1. No evidence of acute intracranial abnormality. 2. Mild chronic small vessel ischemic disease and moderately advanced cerebral atrophy. 3. No acute cervical spine fracture. Electronically Signed   By: Logan Bores M.D.   On: 05/29/2020 20:13   CT Thoracic Spine Wo Contrast  Result Date: 05/29/2020 CLINICAL DATA:  Seizures.  Found on ground. EXAM: CT THORACIC AND LUMBAR SPINE WITHOUT CONTRAST TECHNIQUE: Multidetector CT imaging of the thoracic and lumbar spine was performed without contrast. Multiplanar CT image reconstructions were also generated. COMPARISON:  CT abdomen and pelvis 06/13/2009 FINDINGS: CT THORACIC SPINE FINDINGS Alignment: Mild thoracic  dextroscoliosis.  No listhesis. Vertebrae: No acute fracture or suspicious osseous lesion. Paraspinal and other soft tissues: Aortic and coronary atherosclerosis. Disc levels: Mild-to-moderate multilevel disc degeneration and moderate facet arthrosis in the thoracic spine. No evidence of high-grade spinal stenosis. Moderate left neural  foraminal stenosis at T11-12 due to prominent facet spurring. CT LUMBAR SPINE FINDINGS Segmentation: 5 lumbar type vertebrae. Alignment: Mild lumbar levoscoliosis.  No listhesis. Vertebrae: No acute fracture or suspicious osseous lesion. Paraspinal and other soft tissues: Nonobstructing 1.5 cm left renal stone, much larger than in 2011. Partially visualized left renal cyst measuring at least 3.8 cm. Abdominal aortic atherosclerosis without aneurysm. Disc levels: Mild disc space narrowing at L2-3 with severe narrowing at L4-5. Disc bulging and posterior element hypertrophy result in severe spinal stenosis at L4-5 and mild spinal stenosis at L3-4 greater than L2-3. There is moderate multilevel neural foraminal stenosis. IMPRESSION: 1. No acute osseous abnormality identified in the thoracic or lumbar spine. 2. Thoracic and lumbar disc and facet degeneration as above. Severe spinal stenosis at L4-5. 3. Large nonobstructing left renal stone. 4. Aortic Atherosclerosis (ICD10-I70.0). Electronically Signed   By: Logan Bores M.D.   On: 05/29/2020 20:53   CT Lumbar Spine Wo Contrast  Result Date: 05/29/2020 CLINICAL DATA:  Seizures.  Found on ground. EXAM: CT THORACIC AND LUMBAR SPINE WITHOUT CONTRAST TECHNIQUE: Multidetector CT imaging of the thoracic and lumbar spine was performed without contrast. Multiplanar CT image reconstructions were also generated. COMPARISON:  CT abdomen and pelvis 06/13/2009 FINDINGS: CT THORACIC SPINE FINDINGS Alignment: Mild thoracic dextroscoliosis.  No listhesis. Vertebrae: No acute fracture or suspicious osseous lesion. Paraspinal and other soft tissues:  Aortic and coronary atherosclerosis. Disc levels: Mild-to-moderate multilevel disc degeneration and moderate facet arthrosis in the thoracic spine. No evidence of high-grade spinal stenosis. Moderate left neural foraminal stenosis at T11-12 due to prominent facet spurring. CT LUMBAR SPINE FINDINGS Segmentation: 5 lumbar type vertebrae. Alignment: Mild lumbar levoscoliosis.  No listhesis. Vertebrae: No acute fracture or suspicious osseous lesion. Paraspinal and other soft tissues: Nonobstructing 1.5 cm left renal stone, much larger than in 2011. Partially visualized left renal cyst measuring at least 3.8 cm. Abdominal aortic atherosclerosis without aneurysm. Disc levels: Mild disc space narrowing at L2-3 with severe narrowing at L4-5. Disc bulging and posterior element hypertrophy result in severe spinal stenosis at L4-5 and mild spinal stenosis at L3-4 greater than L2-3. There is moderate multilevel neural foraminal stenosis. IMPRESSION: 1. No acute osseous abnormality identified in the thoracic or lumbar spine. 2. Thoracic and lumbar disc and facet degeneration as above. Severe spinal stenosis at L4-5. 3. Large nonobstructing left renal stone. 4. Aortic Atherosclerosis (ICD10-I70.0). Electronically Signed   By: Logan Bores M.D.   On: 05/29/2020 20:53   MR 3D Recon At Scanner  Result Date: 05/30/2020 CLINICAL DATA:  Right upper quadrant abdominal pain. Cholelithiasis. EXAM: MRI ABDOMEN WITHOUT AND WITH CONTRAST (INCLUDING MRCP) TECHNIQUE: Multiplanar multisequence MR imaging of the abdomen was performed both before and after the administration of intravenous contrast. Heavily T2-weighted images of the biliary and pancreatic ducts were obtained, and three-dimensional MRCP images were rendered by post processing. CONTRAST:  7.42m GADAVIST GADOBUTROL 1 MMOL/ML IV SOLN COMPARISON:  Ultrasound examination, same date. FINDINGS: Examination is limited due to respiratory motion. Lower chest: The lung bases are clear  of an acute process. No pleural effusions or pericardial effusion. Hepatobiliary: No hepatic lesions are identified. No intrahepatic biliary dilatation. Layering dependent gallstones are noted in the gallbladder but no MR findings suspicious for acute cholecystitis. No common bile duct dilatation. Suspect small common bile duct stones distally. Low insertion of the cystic. Pancreas: Significant pancreatic atrophy. This is most notable in the distal body/tail regions there is virtually no pancreatic tissue. Pancreatic duct is abruptly dilated  at the midbody level no pancreatic mass is identified. This could be due to a ductal stricture. Spleen:  Normal size. No focal lesions. Adrenals/Urinary Tract:  The adrenal glands are unremarkable. There are simple renal cysts and a 10 mm left renal calculus. No worrisome renal lesions or hydronephrosis. Stomach/Bowel: The stomach, duodenum, visualized small bowel and visualized colon are grossly normal. Vascular/Lymphatic: Advanced atherosclerotic calcifications involving the aorta but aneurysm dissection. The branch vessels are patent. The major venous structures are patent. Other: No ascites or abdominal hernia. No obvious free air or free fluid. Musculoskeletal: No significant bony findings. IMPRESSION: 1. Examination is limited due to respiratory motion. 2. Cholelithiasis but no MR findings for acute cholecystitis. 3. Suspect small common bile duct stones distally. No common bile duct dilatation. 4. Significant pancreatic atrophy. There is also pancreatic duct dilatation in the mid body tail region with an abrupt transition but no obvious obstructing lesion. Possible pancreatic stricture. CT abdomen with pancreatic protocol may be helpful for further evaluation. 5. Simple renal cysts and a 10 mm left renal calculus. Electronically Signed   By: Marijo Sanes M.D.   On: 05/30/2020 05:13   DG ERCP BILIARY & PANCREATIC DUCTS  Result Date: 05/31/2020 CLINICAL DATA:  ERCP.  EXAM: ERCP TECHNIQUE: Multiple spot images obtained with the fluoroscopic device and submitted for interpretation post-procedure. COMPARISON:  MRCP-05/30/2020 FLUOROSCOPY TIME:  2 minutes, 33 seconds (22.1 mGy) FINDINGS: Twelve spot intraoperative fluoroscopic images of the right upper abdominal quadrant during ERCP are provided for review Initial image demonstrates an ERCP probe overlying the right upper abdominal quadrant Subsequent images demonstrate selective cannulation and opacification of the CBD which appears moderately dilated. There is a lenticular for filling defect within the distal aspect of the CBD likely compatible with questioned choledocholithiasis on preceding MRCP. Subsequent images demonstrate plasty at the level of the biliary ampulla with subsequent biliary sweeping and sphincterotomy. There is minimal opacification the cystic duct and gallbladder lumen. There is minimal opacification of the intrahepatic biliary tree which appears nondilated. There is no definitive opacification of the pancreatic duct. IMPRESSION: ERCP with findings of choledocholithiasis, biliary plasty, sweeping and presumed sphincterotomy. These images were submitted for radiologic interpretation only. Please see the procedural report for the amount of contrast and the fluoroscopy time utilized. Electronically Signed   By: Sandi Mariscal M.D.   On: 05/31/2020 13:09   MR ABDOMEN MRCP W WO CONTAST  Result Date: 05/30/2020 CLINICAL DATA:  Right upper quadrant abdominal pain. Cholelithiasis. EXAM: MRI ABDOMEN WITHOUT AND WITH CONTRAST (INCLUDING MRCP) TECHNIQUE: Multiplanar multisequence MR imaging of the abdomen was performed both before and after the administration of intravenous contrast. Heavily T2-weighted images of the biliary and pancreatic ducts were obtained, and three-dimensional MRCP images were rendered by post processing. CONTRAST:  7.53m GADAVIST GADOBUTROL 1 MMOL/ML IV SOLN COMPARISON:  Ultrasound examination,  same date. FINDINGS: Examination is limited due to respiratory motion. Lower chest: The lung bases are clear of an acute process. No pleural effusions or pericardial effusion. Hepatobiliary: No hepatic lesions are identified. No intrahepatic biliary dilatation. Layering dependent gallstones are noted in the gallbladder but no MR findings suspicious for acute cholecystitis. No common bile duct dilatation. Suspect small common bile duct stones distally. Low insertion of the cystic. Pancreas: Significant pancreatic atrophy. This is most notable in the distal body/tail regions there is virtually no pancreatic tissue. Pancreatic duct is abruptly dilated at the midbody level no pancreatic mass is identified. This could be due to a ductal stricture. Spleen:  Normal size. No focal lesions. Adrenals/Urinary Tract:  The adrenal glands are unremarkable. There are simple renal cysts and a 10 mm left renal calculus. No worrisome renal lesions or hydronephrosis. Stomach/Bowel: The stomach, duodenum, visualized small bowel and visualized colon are grossly normal. Vascular/Lymphatic: Advanced atherosclerotic calcifications involving the aorta but aneurysm dissection. The branch vessels are patent. The major venous structures are patent. Other: No ascites or abdominal hernia. No obvious free air or free fluid. Musculoskeletal: No significant bony findings. IMPRESSION: 1. Examination is limited due to respiratory motion. 2. Cholelithiasis but no MR findings for acute cholecystitis. 3. Suspect small common bile duct stones distally. No common bile duct dilatation. 4. Significant pancreatic atrophy. There is also pancreatic duct dilatation in the mid body tail region with an abrupt transition but no obvious obstructing lesion. Possible pancreatic stricture. CT abdomen with pancreatic protocol may be helpful for further evaluation. 5. Simple renal cysts and a 10 mm left renal calculus. Electronically Signed   By: Marijo Sanes M.D.    On: 05/30/2020 05:13   US Abdomen Limited RUQ (LIVER/GB)  Result Date: 05/29/2020 CLINICAL DATA:  86 year old female with right upper quadrant abdominal pain. EXAM: ULTRASOUND ABDOMEN LIMITED RIGHT UPPER QUADRANT COMPARISON:  Right upper quadrant ultrasound dated 06/13/2009. FINDINGS: Gallbladder: There is sludge and small stones within the gallbladder. There is no gallbladder wall thickening or pericholecystic fluid. Negative sonographic Murphy sign. Common bile duct: Diameter: 5 mm Liver: Coarsened liver echotexture with slight surface nodularity consistent morphologic changes of cirrhosis. Portal vein is patent on color Doppler imaging with normal direction of blood flow towards the liver. Other: None. IMPRESSION: 1. Cholelithiasis without sonographic evidence of acute cholecystitis. 2. Cirrhosis. 3. Patent main portal vein with hepatopetal flow. Electronically Signed   By: Anner Crete M.D.   On: 05/29/2020 22:19    Anti-infectives (From admission, onward)   Start     Dose/Rate Route Frequency Ordered Stop   05/30/20 1615  cefTRIAXone (ROCEPHIN) 2 g in sodium chloride 0.9 % 100 mL IVPB        2 g 200 mL/hr over 30 Minutes Intravenous Every 24 hours 05/30/20 1516     05/30/20 1615  metroNIDAZOLE (FLAGYL) tablet 500 mg        500 mg Oral Every 8 hours 05/30/20 1516         Assessment/Plan HTN HLD DM Transient atrial fibrillation - currently in NSR Seizure disorder AKI on CKD Pancreatic duct dilatation - lipase WNL, no significant signs of inflammation noted on imagine, follow up CEA and Ca 19-9. Unable to perform pancreatogram on ERCP, per GI may require EUS as an outpatient for evaluation of PD stricture  Choledocholithiasis - no signs of cholecystitis on imaging - s/p successful ERCP 12/28 with biliary sphincterotomy, sphincteroplasty and balloon extraction - Discussed the possibility of cholecystectomy with the patient. Currently she is thinking about surgery, but almost  certainly wants to proceed with lap chole.  I recommended this as it is standard of care and she does not have so many comorbidities that this should be prohibited.  We discussed risks and complications with patient and her daughter and both think surgery is a good idea, but patient is going to tell me definitively tomorrow if she wants to proceed.  She will be NPO p MN with the anticipation that we can proceed tomorrow.  ID - rocephin/flagyl 12/27>> VTE - SCDs, ok for chemical DVT prophylaxis from surgical standpoint FEN - IVF, CLD, NPO p MN Foley - none  Follow up - TBD  Henreitta Cea, [facility] Surgery 05/31/2020, 4:02 PM Please see Amion for pager number during day hours 7:00am-4:30pm

## 2020-05-31 NOTE — Anesthesia Preprocedure Evaluation (Signed)
Anesthesia Evaluation  Patient identified by MRN, date of birth, ID band Patient awake    Reviewed: Allergy & Precautions, NPO status , Patient's Chart, lab work & pertinent test results, reviewed documented beta blocker date and time   History of Anesthesia Complications Negative for: history of anesthetic complications  Airway Mallampati: II  TM Distance: >3 FB Neck ROM: Full    Dental  (+) Edentulous Upper, Edentulous Lower   Pulmonary neg pulmonary ROS, former smoker,    Pulmonary exam normal        Cardiovascular hypertension, Pt. on home beta blockers and Pt. on medications + Peripheral Vascular Disease  Normal cardiovascular exam+ dysrhythmias Atrial Fibrillation      Neuro/Psych Seizures -,  negative psych ROS   GI/Hepatic Neg liver ROS, Choledocholithiasis H/o colon cancer   Endo/Other  diabetes, Type 2, Insulin Dependent  Renal/GU Renal Insufficiency and ARFRenal disease  negative genitourinary   Musculoskeletal  (+) Arthritis ,   Abdominal   Peds  Hematology  (+) anemia ,   Anesthesia Other Findings   Reproductive/Obstetrics                             Anesthesia Physical Anesthesia Plan  ASA: III  Anesthesia Plan: General   Post-op Pain Management:    Induction: Intravenous  PONV Risk Score and Plan: 3 and Ondansetron, Dexamethasone, Treatment may vary due to age or medical condition and Midazolam  Airway Management Planned: Oral ETT  Additional Equipment: None  Intra-op Plan:   Post-operative Plan: Extubation in OR  Informed Consent: I have reviewed the patients History and Physical, chart, labs and discussed the procedure including the risks, benefits and alternatives for the proposed anesthesia with the patient or authorized representative who has indicated his/her understanding and acceptance.     Dental advisory given  Plan Discussed with:    Anesthesia Plan Comments:         Anesthesia Quick Evaluation

## 2020-05-31 NOTE — Progress Notes (Signed)
Initial Nutrition Assessment  DOCUMENTATION CODES:   Not applicable  INTERVENTION:   -RD will follow for diet advancement and add supplements as appropriate  NUTRITION DIAGNOSIS:   Inadequate oral intake related to altered GI function as evidenced by NPO status.  GOAL:   Patient will meet greater than or equal to 90% of their needs  MONITOR:   Supplement acceptance,PO intake,Diet advancement,Labs,Weight trends,Skin,I & O's  REASON FOR ASSESSMENT:   Malnutrition Screening Tool    ASSESSMENT:   Taylor Castaneda is a 85 y.o. female with medical history significant of diabetes mellitus type 2, hyperlipidemia, seizure disorder, history colon cancer s/p resection, and anemia presented with complaints of passing out while at her condo.  Pt admitted with choledocholithiasis.   Reviewed I/O's:+1.2 L x 24 hours  Pt unavailable at time of attempted contact. Unable to obtain further nutriton-related history or complete nutrition-focused physical exam at this time.   Per GI notes, plan for ERCP today. Pt currently NPO for procedure. No meal completion records available to assess at this time.   Reviewed wt hx; wt has been stable over the past year.   Medications reviewed and include keppra, flagyl, and 0.9% sodium chloride infusion @ 100 ml/hr.   Lab Results  Component Value Date   HGBA1C 8.7 (H) 02/12/2020   PTA DM medications are 3 units insulin lispro TID before meals and 16 units insulin glargine daily.   Labs reviewed: CBGS: 108-158 (inpatient orders for glycemic control are 8 units insulin glargine daily at bedtime).   Diet Order:   Diet Order            Diet NPO time specified Except for: Sips with Meds  Diet effective midnight                 EDUCATION NEEDS:   No education needs have been identified at this time  Skin:  Skin Assessment: Reviewed RN Assessment  Last BM:  05/29/20  Height:   Ht Readings from Last 1 Encounters:  05/31/20 4\' 11"  (1.499 m)     Weight:   Wt Readings from Last 1 Encounters:  05/31/20 50.8 kg    Ideal Body Weight:  44.5 kg  BMI:  Body mass index is 22.62 kg/m.  Estimated Nutritional Needs:   Kcal:  1350-1550  Protein:  65-80 grams  Fluid:  > 1.3 L    Loistine Chance, RD, LDN, [place] Registered Dietitian II Certified Diabetes Care and Education Specialist Please refer to [facility] for RD and/or RD on-call/weekend/after hours pager

## 2020-06-01 ENCOUNTER — Encounter (HOSPITAL_COMMUNITY): Payer: Self-pay | Admitting: Internal Medicine

## 2020-06-01 ENCOUNTER — Inpatient Hospital Stay (HOSPITAL_COMMUNITY): Payer: Medicare Other | Admitting: Certified Registered"

## 2020-06-01 ENCOUNTER — Encounter (HOSPITAL_COMMUNITY)
Admission: RE | Disposition: A | Discharge status: Rehabilitation Facility | Payer: Self-pay | Source: Other Acute Inpatient Hospital | Attending: Internal Medicine

## 2020-06-01 DIAGNOSIS — K831 Obstruction of bile duct: Secondary | ICD-10-CM | POA: Diagnosis not present

## 2020-06-01 DIAGNOSIS — N183 Chronic kidney disease, stage 3 unspecified: Secondary | ICD-10-CM | POA: Diagnosis not present

## 2020-06-01 DIAGNOSIS — D72829 Elevated white blood cell count, unspecified: Secondary | ICD-10-CM

## 2020-06-01 DIAGNOSIS — K8020 Calculus of gallbladder without cholecystitis without obstruction: Secondary | ICD-10-CM | POA: Diagnosis not present

## 2020-06-01 HISTORY — PX: CHOLECYSTECTOMY: SHX55

## 2020-06-01 LAB — COMPREHENSIVE METABOLIC PANEL
ALT: 93 U/L — ABNORMAL HIGH (ref 0–44)
AST: 70 U/L — ABNORMAL HIGH (ref 15–41)
Albumin: 2.3 g/dL — ABNORMAL LOW (ref 3.5–5.0)
Alkaline Phosphatase: 235 U/L — ABNORMAL HIGH (ref 38–126)
Anion gap: 12 (ref 5–15)
BUN: 33 mg/dL — ABNORMAL HIGH (ref 8–23)
CO2: 18 mmol/L — ABNORMAL LOW (ref 22–32)
Calcium: 7.8 mg/dL — ABNORMAL LOW (ref 8.9–10.3)
Chloride: 112 mmol/L — ABNORMAL HIGH (ref 98–111)
Creatinine, Ser: 1.65 mg/dL — ABNORMAL HIGH (ref 0.44–1.00)
GFR, Estimated: 30 mL/min — ABNORMAL LOW (ref >60)
Glucose, Bld: 255 mg/dL — ABNORMAL HIGH (ref 70–99)
Potassium: 4.8 mmol/L (ref 3.5–5.1)
Sodium: 142 mmol/L (ref 135–145)
Total Bilirubin: 3 mg/dL — ABNORMAL HIGH (ref 0.3–1.2)
Total Protein: 5.2 g/dL — ABNORMAL LOW (ref 6.5–8.1)

## 2020-06-01 LAB — CBC WITH DIFFERENTIAL/PLATELET
Abs Immature Granulocytes: 0.11 10*3/uL — ABNORMAL HIGH (ref 0.00–0.07)
Basophils Absolute: 0 10*3/uL (ref 0.0–0.1)
Basophils Relative: 0 %
Eosinophils Absolute: 0 10*3/uL (ref 0.0–0.5)
Eosinophils Relative: 0 %
HCT: 29.8 % — ABNORMAL LOW (ref 36.0–46.0)
Hemoglobin: 10 g/dL — ABNORMAL LOW (ref 12.0–15.0)
Immature Granulocytes: 1 %
Lymphocytes Relative: 6 %
Lymphs Abs: 0.8 10*3/uL (ref 0.7–4.0)
MCH: 33 pg (ref 26.0–34.0)
MCHC: 33.6 g/dL (ref 30.0–36.0)
MCV: 98.3 fL (ref 80.0–100.0)
Monocytes Absolute: 0.6 10*3/uL (ref 0.1–1.0)
Monocytes Relative: 5 %
Neutro Abs: 11.6 10*3/uL — ABNORMAL HIGH (ref 1.7–7.7)
Neutrophils Relative %: 88 %
Platelets: 113 10*3/uL — ABNORMAL LOW (ref 150–400)
RBC: 3.03 MIL/uL — ABNORMAL LOW (ref 3.87–5.11)
RDW: 12.7 % (ref 11.5–15.5)
WBC: 13.2 10*3/uL — ABNORMAL HIGH (ref 4.0–10.5)
nRBC: 0 % (ref 0.0–0.2)

## 2020-06-01 LAB — RETICULOCYTES
Immature Retic Fract: 10 % (ref 2.3–15.9)
RBC.: 3.06 MIL/uL — ABNORMAL LOW (ref 3.87–5.11)
Retic Count, Absolute: 35.5 10*3/uL (ref 19.0–186.0)
Retic Ct Pct: 1.2 % (ref 0.4–3.1)

## 2020-06-01 LAB — SURGICAL PATHOLOGY

## 2020-06-01 LAB — IRON AND TIBC
Iron: 50 ug/dL (ref 28–170)
Saturation Ratios: 32 % — ABNORMAL HIGH (ref 10.4–31.8)
TIBC: 155 ug/dL — ABNORMAL LOW (ref 250–450)
UIBC: 105 ug/dL

## 2020-06-01 LAB — FOLATE: Folate: 21.4 ng/mL (ref >5.9)

## 2020-06-01 LAB — VITAMIN B12: Vitamin B-12: 588 pg/mL (ref 180–914)

## 2020-06-01 LAB — IGG 4: IgG, Subclass 4: 49 mg/dL (ref 2–96)

## 2020-06-01 LAB — GLUCOSE, CAPILLARY
Glucose-Capillary: 165 mg/dL — ABNORMAL HIGH (ref 70–99)
Glucose-Capillary: 155 mg/dL — ABNORMAL HIGH (ref 70–99)
Glucose-Capillary: 167 mg/dL — ABNORMAL HIGH (ref 70–99)
Glucose-Capillary: 229 mg/dL — ABNORMAL HIGH (ref 70–99)

## 2020-06-01 LAB — MAGNESIUM: Magnesium: 1.8 mg/dL (ref 1.7–2.4)

## 2020-06-01 LAB — CEA: CEA: 3.2 ng/mL (ref 0.0–4.7)

## 2020-06-01 LAB — PHOSPHORUS: Phosphorus: 4.3 mg/dL (ref 2.5–4.6)

## 2020-06-01 LAB — SURGICAL PCR SCREEN
MRSA, PCR: NEGATIVE
Staphylococcus aureus: NEGATIVE

## 2020-06-01 LAB — FERRITIN: Ferritin: 393 ng/mL — ABNORMAL HIGH (ref 11–307)

## 2020-06-01 LAB — CANCER ANTIGEN 19-9: CA 19-9: <2 U/mL (ref 0–35)

## 2020-06-01 SURGERY — LAPAROSCOPIC CHOLECYSTECTOMY WITH INTRAOPERATIVE CHOLANGIOGRAM
Anesthesia: General | Site: Abdomen

## 2020-06-01 MED ORDER — LACTATED RINGERS IV SOLN: INTRAVENOUS | Status: DC

## 2020-06-01 MED ORDER — FENTANYL CITRATE (PF) 100 MCG/2ML IJ SOLN
12.5000 ug | INTRAMUSCULAR | Status: DC | PRN
  Administered 2020-06-01: 12.5 ug via INTRAVENOUS
  Filled 2020-06-01: qty 2

## 2020-06-01 MED ORDER — ROCURONIUM BROMIDE 10 MG/ML (PF) SYRINGE
PREFILLED_SYRINGE | INTRAVENOUS | Status: DC | PRN
  Administered 2020-06-01: 30 mg via INTRAVENOUS

## 2020-06-01 MED ORDER — ACETAMINOPHEN 500 MG PO TABS
1000.0000 mg | ORAL_TABLET | Freq: Four times a day (QID) | ORAL | Status: DC
  Administered 2020-06-01 – 2020-06-06 (×12): 1000 mg via ORAL
  Filled 2020-06-01 (×17): qty 2

## 2020-06-01 MED ORDER — DEXAMETHASONE SODIUM PHOSPHATE 10 MG/ML IJ SOLN
INTRAMUSCULAR | Status: DC | PRN
  Administered 2020-06-01: 5 mg via INTRAVENOUS

## 2020-06-01 MED ORDER — FENTANYL CITRATE (PF) 250 MCG/5ML IJ SOLN
INTRAMUSCULAR | Status: DC | PRN
  Administered 2020-06-01: 50 ug via INTRAVENOUS
  Administered 2020-06-01: 25 ug via INTRAVENOUS

## 2020-06-01 MED ORDER — HEMOSTATIC AGENTS (NO CHARGE) OPTIME
TOPICAL | Status: DC | PRN
  Administered 2020-06-01: 1 via TOPICAL

## 2020-06-01 MED ORDER — PROPOFOL 10 MG/ML IV BOLUS
INTRAVENOUS | Status: DC | PRN
  Administered 2020-06-01: 80 mg via INTRAVENOUS

## 2020-06-01 MED ORDER — LIDOCAINE 2% (20 MG/ML) 5 ML SYRINGE
INTRAMUSCULAR | Status: AC
  Filled 2020-06-01: qty 5

## 2020-06-01 MED ORDER — FENTANYL CITRATE (PF) 250 MCG/5ML IJ SOLN
INTRAMUSCULAR | Status: AC
  Filled 2020-06-01: qty 5

## 2020-06-01 MED ORDER — ROCURONIUM BROMIDE 10 MG/ML (PF) SYRINGE
PREFILLED_SYRINGE | INTRAVENOUS | Status: AC
  Filled 2020-06-01: qty 10

## 2020-06-01 MED ORDER — ONDANSETRON HCL 4 MG/2ML IJ SOLN
INTRAMUSCULAR | Status: DC | PRN
  Administered 2020-06-01: 4 mg via INTRAVENOUS

## 2020-06-01 MED ORDER — SODIUM CHLORIDE 0.9 % IR SOLN
Status: DC | PRN
  Administered 2020-06-01: 1000 mL

## 2020-06-01 MED ORDER — BUPIVACAINE HCL 0.25 % IJ SOLN
INTRAMUSCULAR | Status: DC | PRN
  Administered 2020-06-01: 12 mL

## 2020-06-01 MED ORDER — SUGAMMADEX SODIUM 200 MG/2ML IV SOLN
INTRAVENOUS | Status: DC | PRN
  Administered 2020-06-01: 100 mg via INTRAVENOUS
  Administered 2020-06-01 (×2): 50 mg via INTRAVENOUS

## 2020-06-01 MED ORDER — LIDOCAINE 2% (20 MG/ML) 5 ML SYRINGE
INTRAMUSCULAR | Status: DC | PRN
  Administered 2020-06-01: 60 mg via INTRAVENOUS

## 2020-06-01 MED ORDER — DEXTROSE 5 % IV SOLN
INTRAVENOUS | Status: DC | PRN
  Administered 2020-06-01: 2 g via INTRAVENOUS

## 2020-06-01 MED ORDER — ONDANSETRON HCL 4 MG/2ML IJ SOLN
INTRAMUSCULAR | Status: AC
  Filled 2020-06-01: qty 2

## 2020-06-01 MED ORDER — TRAMADOL HCL 50 MG PO TABS: 50.0000 mg | ORAL_TABLET | Freq: Two times a day (BID) | ORAL | Status: DC | PRN

## 2020-06-01 MED ORDER — SUGAMMADEX SODIUM 200 MG/2ML IV SOLN
INTRAVENOUS | Status: DC | PRN
  Administered 2020-05-31: 200 mg via INTRAVENOUS

## 2020-06-01 MED ORDER — PROPOFOL 10 MG/ML IV BOLUS
INTRAVENOUS | Status: AC
  Filled 2020-06-01: qty 20

## 2020-06-01 MED ORDER — BUPIVACAINE HCL (PF) 0.25 % IJ SOLN
INTRAMUSCULAR | Status: AC
  Filled 2020-06-01: qty 30

## 2020-06-01 MED ORDER — SODIUM CHLORIDE 0.9 % IV SOLN: INTRAVENOUS | Status: AC

## 2020-06-01 MED ORDER — CHLORHEXIDINE GLUCONATE 0.12 % MT SOLN
15.0000 mL | OROMUCOSAL | Status: AC
  Administered 2020-06-01: 15 mL via OROMUCOSAL
  Filled 2020-06-01 (×2): qty 15

## 2020-06-01 MED ORDER — 0.9 % SODIUM CHLORIDE (POUR BTL) OPTIME
TOPICAL | Status: DC | PRN
  Administered 2020-06-01: 1000 mL

## 2020-06-01 MED ORDER — DEXAMETHASONE SODIUM PHOSPHATE 10 MG/ML IJ SOLN
INTRAMUSCULAR | Status: AC
  Filled 2020-06-01: qty 1

## 2020-06-01 MED ORDER — AMISULPRIDE (ANTIEMETIC) 5 MG/2ML IV SOLN: 10.0000 mg | Freq: Once | INTRAVENOUS | Status: DC | PRN

## 2020-06-01 MED ORDER — FENTANYL CITRATE (PF) 100 MCG/2ML IJ SOLN: 25.0000 ug | INTRAMUSCULAR | Status: DC | PRN

## 2020-06-01 SURGICAL SUPPLY — 43 items
ADH SKN CLS APL DERMABOND .7 (GAUZE/BANDAGES/DRESSINGS) ×1
APL PRP STRL LF DISP 70% ISPRP (MISCELLANEOUS) ×1
APPLIER CLIP 5 13 M/L LIGAMAX5 (MISCELLANEOUS) ×2
APR CLP MED LRG 5 ANG JAW (MISCELLANEOUS) ×1
BAG SPEC RTRVL LRG 6X4 10 (ENDOMECHANICALS) ×1
CANISTER SUCT 3000ML PPV (MISCELLANEOUS) ×2 IMPLANT
CHLORAPREP W/TINT 26 (MISCELLANEOUS) ×2 IMPLANT
CLIP APPLIE 5 13 M/L LIGAMAX5 (MISCELLANEOUS) ×1 IMPLANT
CNTNR URN SCR LID CUP LEK RST (MISCELLANEOUS) ×1 IMPLANT
CONT SPEC 4OZ STRL OR WHT (MISCELLANEOUS) ×2
COVER MAYO STAND STRL (DRAPES) IMPLANT
COVER SURGICAL LIGHT HANDLE (MISCELLANEOUS) ×2 IMPLANT
COVER WAND RF STERILE (DRAPES) ×2 IMPLANT
DERMABOND ADVANCED (GAUZE/BANDAGES/DRESSINGS) ×1
DERMABOND ADVANCED .7 DNX12 (GAUZE/BANDAGES/DRESSINGS) ×1 IMPLANT
DRAPE C-ARM 42X120 X-RAY (DRAPES) IMPLANT
ELECT REM PT RETURN 9FT ADLT (ELECTROSURGICAL) ×2
ELECTRODE REM PT RTRN 9FT ADLT (ELECTROSURGICAL) ×1 IMPLANT
GLOVE BIO SURGEON STRL SZ 6 (GLOVE) ×2 IMPLANT
GLOVE INDICATOR 6.5 STRL GRN (GLOVE) ×2 IMPLANT
GOWN STRL REUS W/ TWL LRG LVL3 (GOWN DISPOSABLE) ×3 IMPLANT
GOWN STRL REUS W/TWL LRG LVL3 (GOWN DISPOSABLE) ×6
GRASPER SUT TROCAR 14GX15 (MISCELLANEOUS) ×2 IMPLANT
HEMOSTAT SNOW SURGICEL 2X4 (HEMOSTASIS) ×1 IMPLANT
KIT BASIN OR (CUSTOM PROCEDURE TRAY) ×2 IMPLANT
KIT TURNOVER KIT B (KITS) ×2 IMPLANT
NDL INSUFFLATION 14GA 120MM (NEEDLE) ×1 IMPLANT
NEEDLE INSUFFLATION 14GA 120MM (NEEDLE) ×2 IMPLANT
NS IRRIG 1000ML POUR BTL (IV SOLUTION) ×2 IMPLANT
PAD ARMBOARD 7.5X6 YLW CONV (MISCELLANEOUS) ×2 IMPLANT
POUCH SPECIMEN RETRIEVAL 10MM (ENDOMECHANICALS) ×2 IMPLANT
SCISSORS LAP 5X35 DISP (ENDOMECHANICALS) ×2 IMPLANT
SET CHOLANGIOGRAPH 5 50 .035 (SET/KITS/TRAYS/PACK) IMPLANT
SET IRRIG TUBING LAPAROSCOPIC (IRRIGATION / IRRIGATOR) ×2 IMPLANT
SET TUBE SMOKE EVAC HIGH FLOW (TUBING) ×2 IMPLANT
SLEEVE ENDOPATH XCEL 5M (ENDOMECHANICALS) ×4 IMPLANT
SUT MNCRL AB 4-0 PS2 18 (SUTURE) ×2 IMPLANT
TOWEL GREEN STERILE (TOWEL DISPOSABLE) ×2 IMPLANT
TOWEL GREEN STERILE FF (TOWEL DISPOSABLE) ×2 IMPLANT
TRAY LAPAROSCOPIC MC (CUSTOM PROCEDURE TRAY) ×2 IMPLANT
TROCAR XCEL NON-BLD 11X100MML (ENDOMECHANICALS) ×2 IMPLANT
TROCAR XCEL NON-BLD 5MMX100MML (ENDOMECHANICALS) ×2 IMPLANT
WATER STERILE IRR 1000ML POUR (IV SOLUTION) ×2 IMPLANT

## 2020-06-01 NOTE — Transfer of Care (Signed)
Immediate Anesthesia Transfer of Care Note  Patient: Taylor Castaneda  Procedure(s) Performed: LAPAROSCOPIC CHOLECYSTECTOMY (N/A Abdomen)  Patient Location: PACU  Anesthesia Type:General  Level of Consciousness: awake, drowsy and patient cooperative  Airway & Oxygen Therapy: Patient Spontanous Breathing  Post-op Assessment: Report given to RN, Post -op Vital signs reviewed and stable and Patient moving all extremities  Post vital signs: Reviewed and stable  Last Vitals:  Vitals Value Taken Time  BP 146/65 06/01/20 1402  Temp    Pulse 56 06/01/20 1403  Resp 32 06/01/20 1403  SpO2 96 % 06/01/20 1403  Vitals shown include unvalidated device data.  Last Pain:  Vitals:   06/01/20 0550  TempSrc: Oral  PainSc:          Complications: No complications documented.

## 2020-06-01 NOTE — Op Note (Signed)
Operative Note  Taylor Castaneda 86 y.o. female [id number]  06/01/2020  Surgeon: Clovis Riley MD FACS  Procedure performed: Laparoscopic Cholecystectomy  Procedure classification: urgent  Preop diagnosis: Choledocholithiasis status post ERCP Post-op diagnosis/intraop findings: same  Specimens: gallbladder  Retained items: none  EBL: minimal  Complications: none  Description of procedure: After obtaining informed consent the patient was brought to the operating room. Antibiotics were administered. SCD's were applied. General endotracheal anesthesia was initiated and a formal time-out was performed. The abdomen was prepped and draped in the usual sterile fashion and the abdomen was entered using a left subcostal Veress needle after instilling the site with local. Insufflation to 42mmHg was obtained without incident, a right subcostal 52mm trocar and camera inserted, and inspection demonstrated diffuse omental adhesions to the anterior abdominal wall.  An additional 5 mm trocar was placed laterally under direct visualization after infiltration with local and blunt and sharp dissection were used to clear enough abdominal wall for placement of our supraumbilical 5 mm camera trocar. An 26mm trocar was placed in the epigastrium. The gallbladder was retracted cephalad and the infundibulum was retracted laterally.  Omental adhesions to the gallbladder were taken down bluntly and with cautery.  A combination of hook electrocautery and blunt dissection was utilized to clear the peritoneum from the neck and cystic duct, circumferentially isolating the cystic artery and cystic duct and lifting the gallbladder from the cystic plate. The critical view of safety was achieved with the cystic artery, cystic duct, and liver bed visualized between them with no other structures. The artery was clipped with 2 clips proximally and 1 distally and divided as was the cystic duct with three clips on the proximal end.  The gallbladder was dissected from the liver plate using electrocautery. Once freed the gallbladder was placed in an endocatch bag and removed intact through the epigastric trocar site. A small amount of bleeding on the liver bed was controlled with cautery.  Surgicel snow was placed in the liver bed. The right upper quadrant was irrigated and aspirated, the effluent was clear. Hemostasis was once again confirmed, and reinspection of the abdomen revealed no injuries. The clips were well opposed without any bile leak from the duct or the liver bed. The 42mm trocar site in the epigastrium was closed with a 0 vicryl in the fascia under direct visualization using a PMI device. The abdomen was desufflated and all trocars removed. The skin incisions were closed with subcuticular monocryl and Dermabond. The patient was awakened, extubated and transported to the recovery room in stable condition.    All counts were correct at the completion of the case.

## 2020-06-01 NOTE — Plan of Care (Signed)

## 2020-06-01 NOTE — Plan of Care (Signed)
  Problem: Nutrition Goal: Nutritional status is improving Description: Monitor and assess patient for malnutrition (ex- brittle hair, bruises, dry skin, pale skin and conjunctiva, muscle wasting, smooth red tongue, and disorientation). Collaborate with interdisciplinary team and initiate plan and interventions as ordered.  Monitor patient's weight and dietary intake as ordered or per policy. Utilize nutrition screening tool and intervene per policy. Determine patient's food preferences and provide high-protein, high-caloric foods as appropriate.  06/01/2020 0800 by Bethann Punches, RN Outcome: Progressing 06/01/2020 0800 by Bethann Punches, RN Outcome: Progressing   Problem: Education: Goal: Knowledge of General Education information will improve Description: Including pain rating scale, medication(s)/side effects and non-pharmacologic comfort measures 06/01/2020 0800 by Bethann Punches, RN Outcome: Progressing 06/01/2020 0800 by Bethann Punches, RN Outcome: Progressing   Problem: Health Behavior/Discharge Planning: Goal: Ability to manage health-related needs will improve 06/01/2020 0800 by Bethann Punches, RN Outcome: Progressing 06/01/2020 0800 by Bethann Punches, RN Outcome: Progressing   Problem: Clinical Measurements: Goal: Ability to maintain clinical measurements within normal limits will improve 06/01/2020 0800 by Bethann Punches, RN Outcome: Progressing 06/01/2020 0800 by Bethann Punches, RN Outcome: Progressing Goal: Will remain free from infection 06/01/2020 0800 by Bethann Punches, RN Outcome: Progressing 06/01/2020 0800 by Bethann Punches, RN Outcome: Progressing Goal: Diagnostic test results will improve 06/01/2020 0800 by Bethann Punches, RN Outcome: Progressing 06/01/2020 0800 by Bethann Punches, RN Outcome: Progressing Goal: Respiratory complications will improve 06/01/2020 0800 by Bethann Punches, RN Outcome: Progressing 06/01/2020 0800 by Bethann Punches, RN Outcome: Progressing Goal: Cardiovascular complication will be avoided 06/01/2020 0800 by Bethann Punches, RN Outcome: Progressing 06/01/2020 0800 by Bethann Punches, RN Outcome: Progressing   Problem: Activity: Goal: Risk for activity intolerance will decrease 06/01/2020 0800 by Bethann Punches, RN Outcome: Progressing 06/01/2020 0800 by Bethann Punches, RN Outcome: Progressing   Problem: Nutrition: Goal: Adequate nutrition will be maintained 06/01/2020 0800 by Bethann Punches, RN Outcome: Progressing 06/01/2020 0800 by Bethann Punches, RN Outcome: Progressing   Problem: Coping: Goal: Level of anxiety will decrease 06/01/2020 0800 by Bethann Punches, RN Outcome: Progressing 06/01/2020 0800 by Bethann Punches, RN Outcome: Progressing   Problem: Elimination: Goal: Will not experience complications related to bowel motility 06/01/2020 0800 by Bethann Punches, RN Outcome: Progressing 06/01/2020 0800 by Bethann Punches, RN Outcome: Progressing Goal: Will not experience complications related to urinary retention 06/01/2020 0800 by Bethann Punches, RN Outcome: Progressing 06/01/2020 0800 by Bethann Punches, RN Outcome: Progressing   Problem: Pain Managment: Goal: General experience of comfort will improve 06/01/2020 0800 by Bethann Punches, RN Outcome: Progressing 06/01/2020 0800 by Bethann Punches, RN Outcome: Progressing   Problem: Safety: Goal: Ability to remain free from injury will improve 06/01/2020 0800 by Bethann Punches, RN Outcome: Progressing 06/01/2020 0800 by Bethann Punches, RN Outcome: Progressing   Problem: Skin Integrity: Goal: Risk for impaired skin integrity will decrease 06/01/2020 0800 by Bethann Punches, RN Outcome: Progressing 06/01/2020 0800 by Bethann Punches, RN Outcome: Progressing

## 2020-06-01 NOTE — Progress Notes (Signed)
1 Day Post-Op   Subjective/Chief Complaint: Reports minimal ruq pain this am   Objective: Vital signs in last 24 hours: Temp:  [97.5 F (36.4 C)-97.8 F (36.6 C)] 97.8 F (36.6 C) (12/29 0550) Pulse Rate:  [60-74] 63 (12/29 0550) Resp:  [10-19] 18 (12/29 0550) BP: (153-181)/(55-82) 157/74 (12/29 0550) SpO2:  [94 %-100 %] 99 % (12/29 0550) Weight:  [50.8 kg] 50.8 kg (12/28 0912) Last BM Date: 05/29/20  Intake/Output from previous day: 12/28 0701 - 12/29 0700 In: 800 [I.V.:800] Out: -  Intake/Output this shift: No intake/output data recorded.  General appearance: alert and cooperative Resp: unlabored Cardio: regular rate and rhythm GI: soft, minimal subjective tenderness RUQ, well healed midline laparotomy scar Skin: Skin color, texture, turgor normal. No rashes or lesions Neurologic: Grossly normal  Lab Results:  Recent Labs    05/31/20 0043 06/01/20 0035  WBC 12.2* 13.2*  HGB 9.0* 10.0*  HCT 27.5* 29.8*  PLT 121* 113*   BMET Recent Labs    05/31/20 0043 06/01/20 0035  NA 141 142  K 4.0 4.8  CL 107 112*  CO2 25 18*  GLUCOSE 193* 255*  BUN 31* 33*  CREATININE 1.68* 1.65*  CALCIUM 8.2* 7.8*   PT/INR No results for input(s): LABPROT, INR in the last 72 hours. ABG No results for input(s): PHART, HCO3 in the last 72 hours.  Invalid input(s): PCO2, PO2  Studies/Results: DG ERCP BILIARY & PANCREATIC DUCTS  Result Date: 05/31/2020 CLINICAL DATA:  ERCP. EXAM: ERCP TECHNIQUE: Multiple spot images obtained with the fluoroscopic device and submitted for interpretation post-procedure. COMPARISON:  MRCP-05/30/2020 FLUOROSCOPY TIME:  2 minutes, 33 seconds (22.1 mGy) FINDINGS: Twelve spot intraoperative fluoroscopic images of the right upper abdominal quadrant during ERCP are provided for review Initial image demonstrates an ERCP probe overlying the right upper abdominal quadrant Subsequent images demonstrate selective cannulation and opacification of the CBD which  appears moderately dilated. There is a lenticular for filling defect within the distal aspect of the CBD likely compatible with questioned choledocholithiasis on preceding MRCP. Subsequent images demonstrate plasty at the level of the biliary ampulla with subsequent biliary sweeping and sphincterotomy. There is minimal opacification the cystic duct and gallbladder lumen. There is minimal opacification of the intrahepatic biliary tree which appears nondilated. There is no definitive opacification of the pancreatic duct. IMPRESSION: ERCP with findings of choledocholithiasis, biliary plasty, sweeping and presumed sphincterotomy. These images were submitted for radiologic interpretation only. Please see the procedural report for the amount of contrast and the fluoroscopy time utilized. Electronically Signed   By: Sandi Mariscal M.D.   On: 05/31/2020 13:09    Anti-infectives: Anti-infectives (From admission, onward)   Start     Dose/Rate Route Frequency Ordered Stop   05/30/20 1615  cefTRIAXone (ROCEPHIN) 2 g in sodium chloride 0.9 % 100 mL IVPB        2 g 200 mL/hr over 30 Minutes Intravenous Every 24 hours 05/30/20 1516     05/30/20 1615  metroNIDAZOLE (FLAGYL) tablet 500 mg  Status:  Discontinued        500 mg Oral Every 8 hours 05/30/20 1516 05/31/20 1611      Assessment/Plan: HTN HLD DM Transient atrial fibrillation - currently in NSR Seizure disorder AKI on CKD Pancreatic duct dilatation - lipase WNL, no significant signs of inflammation noted on imagine, follow up CEA and Ca 19-9. Unable to perform pancreatogram on ERCP, per GI may require EUS as an outpatient for evaluation of PD stricture  Choledocholithiasis -  no signs of cholecystitis on imaging - s/p successful ERCP 12/28 with biliary sphincterotomy, sphincteroplasty and balloon extraction - She has decided she wishes to proceed with cholecystectomy this admission. Discussed risks of surgery including bleeding, pain, scarring,  intraabdominal injury specifically to the common bile duct and sequelae, bile leak, conversion to open surgery, blood clot, pneumonia, heart attack, stroke, failure to resolve symptoms, etc. Questions welcomed and answered. Plan OR this morning.  ID - rocephin/flagyl 12/27>> VTE - SCDs, ok for chemical DVT prophylaxis from surgical standpoint FEN - NPO for surgery Foley - none Follow up - TBD  LOS: 2 days    Clovis Riley 06/01/2020

## 2020-06-01 NOTE — Progress Notes (Signed)
PROGRESS NOTE    Taylor Castaneda  NWG:[id number] DOB: [birth year] DOA: 05/30/2020 PCP: [name], MD   Brief Narrative:  HPI per Dr. Fuller Plan on 05/30/20 Taylor Castaneda is a 86 y.o. female with medical history significant of diabetes mellitus type 2, hyperlipidemia, seizure disorder, history colon cancer s/p resection, and anemia presented with complaints of passing out while at her condo.  The patient's daughter found her on the floor and was able to help her get to a chair.  However when EMS arrived patient had a witnessed generalized tonic-clonic seizure with urinary incontinence lasting approximately 1 minute prior to self resolving.  They did not have to give any abortive medications.  She had been compliant with her medications and last reported having a seizure over a year ago.  At baseline patient lives alone and handles all of her own medications.  Patient had not been feeling well earlier in the day with complaints of epigastric abdominal pain with radiation to her back.  Denied any recent trauma prior to onset of symptoms.  Symptoms lasted approximately 6 to 7 hours prior to self resolving.  She had taken Tums and Tylenol prior to coming into the hospital. CT scan of the head and cervical spine did not note any acute abnormalities.  CT imaging of the thoracic and lumbar spine noted severe spinal stenosis at L4-5 and large nonobstructing left renal stone.  Bowel sounds were noted to be stable.  Labs at the outside facility yesterday had been significant for WBC 17.8, hemoglobin 11.4, BUN 37, creatinine 1.4, alkaline phosphatase 283, AST 315, ALT 235, total bilirubin 3.3 and lactic acid initially 4->2.6.  COVID-19 and influenza screening was negative.  Right upper quadrant ultrasound revealed cholelithiasis without evidence of acute cholecystitis and cirrhosis.  Case had been discussed with GI here at [facility] who recommended MRCP which revealed cholelithiasis without signs of acute  cholecystitis, suspected small common bile duct stones distally with no duct dilatation, and pancreatic atrophy with dilatation in the mid body tail region with abrupt transition point concerning for possible stricture.  She had been given Keppra 1000 mg IV and 500 mL of normal saline IV fluids.  Patient had been accepted to the hospitalist service to a medical telemetry bed.  Repeat labs revealed WBC 25.6, hemoglobin 10.7, alkaline phosphatase 270, AST 219, ALT 207, and total bilirubin 3.7, lactic acid 2.2.  **Interim History  Patient underwent an ERCP today and she had a periampullary diverticulum and she was noted to have choledocholithiasis which is status post biliary sphincterotomy and sphincteroplasty and balloon extraction.  She had cholelithiasis without cholecystitis and she had a mild gastritis.  Pancreatogram was not obtained and GI recommended watching for pancreatitis, bleeding or perforation and cholangitis.  Recommend a clear liquid diet and continue current medications and continue antibiotics for 5 days total and stopping the Flagyl.  He also recommended obtaining a surgical consultation while inpatient for questionable lap chole and following the results of the CA 19-9, CEA, IgG for.  Dr. Lyndel Safe feels that she will require an EUS as an outpatient for evaluation of pancreatic duct stricture.  After procedure she was sedated and very somnolent and drowsy and unable to be aroused given that she was asleep  General surgery was consulted on 05/31/2020 and after discussion with the patient they recommended a cholecystectomy which the patient was agreeable to.  Patient went earlier this morning for cholecystectomy and she has just, the packing is seen on the floor.  Assessment & Plan:   Principal Problem:   Cholelithiasis Active Problems:   Hyperlipidemia associated with type 2 diabetes mellitus (HCC)   CKD (chronic kidney disease), stage III (HCC)   Hyperbilirubinemia   Normocytic  anemia   Transaminitis   Pancreatic duct dilated   Common bile duct (CBD) obstruction   Cholelithiasis and choledocholithiasis status post ERCP on 05/31/2020 and now cholecystectomy postoperative day 0 Obstructive Jaundice with Hyperbilirubinemia and Abnormal LFTs -Acute.  Patient present with complaints of abdominal pain with radiation to her back.   -Initial CT imaging studies significant for cholelithiasis without signs of cholecystitis and cirrhosis.   -GI has been initially consulted and MRCP was recommended.  MRCP revealed cholelithiasis without cholecystitis, small gallstones distally without clear duct dilatation, pancreatic duct dilatation in the mid body region with an abrupt transition, but no obvious obstructing lesion.   -Patient transferred for need of ERCP and this was done today  -Admit to a medical telemetry -Check blood cultures and showed NGTD at 2 days -Carb modified diet and n.p.o. after; Now on CLD diet per GI -Start empiric antibiotics of Rocephin and metronidazole; GI recommending stopping Flagyl  -Follow up on CA19-9 was less than 2, CEA was 3.2, IgG4 was 49 -[place] [facility] consult, will follow-up for further recommendations -Underwent ERCP today and she had a periampullary diverticulum and she was noted to have choledocholithiasis which is status post biliary sphincterotomy and sphincteroplasty and balloon extraction.  She had cholelithiasis without cholecystitis and she had a mild gastritis.   -Pancreatogram was not obtained and GI recommended watching for pancreatitis, bleeding or perforation and cholangitis.   -Dr. Lyndel Safe Recommend a clear liquid diet and continue current medications and continue antibiotics for 5 days total and stopping the Flagyl. He also recommended obtaining a surgical consultation while inpatient for questionable lap chole and following the results of the CA 19-9, CEA, IgG4. -T Bili went from 3.3 -> 3.7 -> 3.1 and it is now 3.0 -General Surgery  consulted for further evaluation and they they discussed the possibility of cholecystectomy with the patient.  She is agreeable to surgical intervention and decided to proceed with cholecystectomy and this was done on 06/01/2020 -Will need PT/OT to evaluate and Treat to determine safe discharge disposition and this can be done after her surgery and this is still pending  Pancreatic Duct Dilatation  -As seen above.  Lipase was limits and no significant signs of inflammation noted on imaging. -CT of the abdomen with and without contrast -Further Care Per GI -Dr. Lyndel Safe feels that she will require an EUS as an outpatient for evaluation of pancreatic duct stricture as she did not have a Pancreatogram done here  Leukocytosis and lactic acidosis -Acute.  -WBC elevated from 17.8 to 25.6 with lactic acid initially elevated up to 4, but slowly trending down.  -Patient vital signs were noted to be relatively within normal limits not necessarily meeting sepsis criteria. -WBC was trending down and is now 12.2 yesterday but slightly bumped and went to 13.2 today.   -LA is trending down and LA was last 2.4 -Follow-up blood cultures and pending  -Check urinalysis and culture if necessary -Continue IV fluids and reduce and stop after 1 more day -Trend lactic acid levels  Seizure Disorder -Patient had witnessed generalized tonic-clonic seizure lasting 1 minute with continence of urine yesterday.   -Suspect this could have been the reason patient had fallen on the floor as well.   -Home medications include Keppra 250 mg twice  daily. Patient had received 1000 mg IV yesterday. -Seizure precaution  -Continue home regimen of Keppra for now  -May need to get an EEG  Essential Hypertension -Home blood pressure medications include Altace 5 mg daily, Coreg 12.5 mg twice daily, and furosemide 20 mg daily. -Continue to Hold furosemide but resumed Ramipril 5 mg po Daily -Continue Carvedilol 12.5 mg po  BID -Continue to Monitor BP per Protocol   Acute kidney injury superimposed on chronic kidney disease IIIb Metabolic acidosis -Creatinine was initially noted to be 1.4 (around patient's baseline), but repeat check today 1.76 with BUN 34 on admission; Now BUN/Cr was 31/1.68 today is now 33/1.65 -Check CK and was 75 -Patient has a small acidosis now with a CO2 of 18, chloride level 112, and anion gap of 12 -Continue normal saline IV fluids at 100 mL/h but will reduce the fluid rate to 75 MLS per hour for another day -Avoid nephrotoxic medications, contrast dyes, hypotension and renally dose medications -Repeat CMP in a.m.  Normocytic Anemia -Acute.     -Hemoglobin/hematocrit has been slowly dropping from 11.4/35.0 and trended down to 10.0/29.8 today -Check anemia panel and showed an iron level of 50, TIBC 105, TIBC 155, saturation ratio 32%, ferritin level of 393, folate of 21.4, and vitamin B12 of 588 -Continue to monitor for signs and symptoms of bleeding; currently no overt bleeding noted -Repeat CBC in a.m.  Diabetes mellitus type 2, uncontrolled -On admission glucose elevated up to 205.  Last hemoglobin A1c noted to be 8.7 on 02/12/2020.  Home medication includes Lantus 16 units nightly. -Hypoglycemic protocol -Check hemoglobin A1c in the AM  -Continue half dose of Lantus -CBGs before every meal with moderate SSI  -CBGs ranging from 152-196  Abnormal LFTs and Hyperbilirubinemia, slowly improving -Acute.  Patient found to have AST 315->219 -> 86 and now AST is 70, ALT 235->207 -> 119 and now ALT is 93, and total bilirubin 3.3->3.7 -> 3.1 and now is 3.0. -Continue to monitor and trend Hepatic Fxn Panel -Repeat CMP in the AM  Elevated Alk Phos -Was elevated to 282 on admission is trended down and today is 235 -Continue to monitor and trend repeat CMP in a.m.  Hyperlipidemia -Hold Atorvastatin due to elevated liver enzymes and resume once they are normalized  DVT  prophylaxis: SCDs Code Status: FULL CODE Family Communication: Spoke with son at bedside Disposition Plan: Pending further evaluation by PT OT, clearance by gastroenterology as well as general surgery  Status is: Inpatient  Remains inpatient appropriate because:Unsafe d/c plan, IV treatments appropriate due to intensity of illness or inability to take PO and Inpatient level of care appropriate due to severity of illness   Dispo: The patient is from: Home              Anticipated d/c is to: SNF              Anticipated d/c date is: 2 days              Patient currently is not medically stable to d/c.   Consultants:   Gastroenterology  General Surgery    Procedures:  ERCP Findings:      The scout film was normal. The upper GI tract was traversed under direct       vision without detailed examination. Mild antral gastritis was noted.      The major papilla was located at lower lip of periampullary diverticula.       The bile duct was  deeply cannulated with the short-nosed traction       sphincterotome. Contrast was injected. I personally interpreted the bile       duct images. There was brisk flow of contrast through the ducts. Image       quality was adequate. Contrast extended to the entire biliary tree. 3       filling defects c/w choledocholithiasis was found in CBD which was       mildly dilated to 10 mm. Low insertion of cystic duct was noted. Cystic       duct did fill. Multiple filling defects in the gallbladder consistent       with cholelithiasis.      A 6 mm biliary sphincterotomy was made with a monofilament traction       (standard) sphincterotome using ERBE electrocautery at 12 o'clock       position. There was no post-sphincterotomy bleeding. Due to       periampullary diverticula, we elected to proceed with sphincteroplasty.       Sphincteroplasty was performed using 03-15-11 mm x 5.5 cm CRE balloon to       a maximum balloon size of 10 mm. The biliary tree was  swept multiple       times with a 12 mm balloon starting at the bifurcation. All 3 stones       were removed. Nothing was found on postocclusion cholangiogram. The bile       was green. There was no pus.      Pancreatogram was not obtained. We did try few times but the wire would       not find pancreatic duct after the sphincterotomy.      Mild antral gastritis was biopsied with a cold forceps for histology to       r/o HP. Impression:               -Periampullary diverticulum.                           -Choledocholithiasis s/p biliary sphincterotomy,                            sphincteroplasty and balloon extraction.                           -Cholelithiasis without cholecystitis.                           -Mild gastritis.                           -Pancreatogram not obtained. Recommendation:           - Watch for pancreatitis, bleeding, perforation,                            and cholangitis.                           - Clear liquid diet.                           - Continue present medications.                           -  Continue antibiotics x total of 5 days. Can stop                            Flagyl.                           - Can obtain surgical consultation while inpatient                            ?Lap chole.                           - Follow results of CA 19?9/CEA/IgG4. I do believe                            she will require EUS as an outpatient for                            evaluation of PD stricture. I will run it by Dr.                            Wendie Agreste. Mansouraty                           - The findings and recommendations were discussed                            with the patient's family.  Laparoscopic cholecystectomy was performed by Dr. Romana Juniper on 06/01/2020  Antimicrobials: Anti-infectives (From admission, onward)   Start     Dose/Rate Route Frequency Ordered Stop   05/30/20 1615  [MAR Hold]  cefTRIAXone (ROCEPHIN) 2 g in sodium chloride 0.9 %  100 mL IVPB        (MAR Hold since Wed 06/01/2020 at 1023.Hold Reason: Transfer to a Procedural area.)   2 g 200 mL/hr over 30 Minutes Intravenous Every 24 hours 05/30/20 1516     05/30/20 1615  metroNIDAZOLE (FLAGYL) tablet 500 mg  Status:  Discontinued        500 mg Oral Every 8 hours 05/30/20 1516 05/31/20 1611        Subjective: Seen And examined at bedside and she is a little drowsy still but denies any nausea or vomiting.  No complaints or pain.  Felt okay.  No shortness of breath.  No other concerns or plans this time.  Objective: Vitals:   06/01/20 1045 06/01/20 1400 06/01/20 1415 06/01/20 1430  BP:  (!) 146/65 (!) 157/62 (!) 165/65  Pulse:  (!) 57 (!) 56 (!) 55  Resp:  '15 14 14  ' Temp:  (!) 97.2 F (36.2 C)  (!) 97 F (36.1 C)  TempSrc:      SpO2:  96% 96% 98%  Weight: 50.8 kg     Height: '4\' 11"'  (1.499 m)       Intake/Output Summary (Last 24 hours) at 06/01/2020 1446 Last data filed at 06/01/2020 1347 Gross per 24 hour  Intake 1000 ml  Output 100 ml  Net 900 ml   Filed Weights   05/30/20 1645 05/31/20 0912 06/01/20 1045  Weight: 50.8 kg 50.8 kg 50.8 kg   Examination: Physical  Exam:  Constitutional: WN/WD elderly Caucasian jaundiced female in no acute distress appears calm and comfortable is little drowsy Eyes: Lids and conjunctivae normal, sclerae anicteric  ENMT: External Ears, Nose appear normal. Grossly normal hearing.  Neck: Appears normal, supple, no cervical masses, normal ROM, no appreciable thyromegaly: No JVD Respiratory: Diminished to auscultation bilaterally with coarse breath sounds, no wheezing, rales, rhonchi or crackles. Normal respiratory effort and patient is not tachypenic. No accessory muscle use.  Labored breathing Cardiovascular: RRR, no murmurs / rubs / gallops. S1 and S2 auscultated.  No edema Abdomen: Soft, non-tender, very slightly distended.  Abdominal incisions appear clean dry and intact.  Bowel sounds positive.  GU:  Deferred. Musculoskeletal: No clubbing / cyanosis of digits/nails. No joint deformity upper and lower extremities.  Skin: No rashes, lesions, ulcers on a limited skin evaluation. No induration; Warm and dry.  Neurologic: CN 2-12 grossly intact with no focal deficits. Romberg sign and cerebellar reflexes not assessed.  Psychiatric: Normal judgment and insight. Slightly drowsy. Normal mood and appropriate affect.   Data Reviewed: I have personally reviewed following labs and imaging studies  CBC: Recent Labs  Lab 05/29/20 1932 05/30/20 0139 05/31/20 0043 06/01/20 0035  WBC 17.8* 25.6* 12.2* 13.2*  NEUTROABS 16.3* 23.7*  --  11.6*  HGB 11.4* 10.7* 9.0* 10.0*  HCT 35.0* 31.4* 27.5* 29.8*  MCV 98.3 95.7 97.2 98.3  PLT 156 147* 121* 446*   Basic Metabolic Panel: Recent Labs  Lab 05/29/20 1932 05/30/20 1655 05/31/20 0043 06/01/20 0035  NA 139 140 141 142  K 4.3 4.4 4.0 4.8  CL 104 103 107 112*  CO2 '22 24 25 ' 18*  GLUCOSE 205* 157* 193* 255*  BUN 37* 34* 31* 33*  CREATININE 1.40* 1.76* 1.68* 1.65*  CALCIUM 8.8* 8.7* 8.2* 7.8*  MG  --   --   --  1.8  PHOS  --   --   --  4.3   GFR: Estimated Creatinine Clearance: 17 mL/min (A) (by C-G formula based on SCr of 1.65 mg/dL (H)). Liver Function Tests: Recent Labs  Lab 05/29/20 1932 05/30/20 0139 05/31/20 0043 06/01/20 0035  AST 315* 219* 86* 70*  ALT 235* 207* 119* 93*  ALKPHOS 282* 270* 208* 235*  BILITOT 3.3* 3.7* 3.1* 3.0*  PROT 6.3* 6.2* 5.1* 5.2*  ALBUMIN 3.2* 3.1* 2.4* 2.3*   Recent Labs  Lab 05/29/20 2230  LIPASE 25   No results for input(s): AMMONIA in the last 168 hours. Coagulation Profile: No results for input(s): INR, PROTIME in the last 168 hours. Cardiac Enzymes: Recent Labs  Lab 05/30/20 1717  CKTOTAL 75   BNP (last 3 results) No results for input(s): PROBNP in the last 8760 hours. HbA1C: No results for input(s): HGBA1C in the last 72 hours. CBG: Recent Labs  Lab 05/31/20 1316 05/31/20 1624  05/31/20 2122 06/01/20 1012 06/01/20 1406  GLUCAP 152* 150* 196* 165* 155*   Lipid Profile: No results for input(s): CHOL, HDL, LDLCALC, TRIG, CHOLHDL, LDLDIRECT in the last 72 hours. Thyroid Function Tests: No results for input(s): TSH, T4TOTAL, FREET4, T3FREE, THYROIDAB in the last 72 hours. Anemia Panel: Recent Labs    06/01/20 0035  VITAMINB12 588  FOLATE 21.4  FERRITIN 393*  TIBC 155*  IRON 50  RETICCTPCT 1.2   Sepsis Labs: Recent Labs  Lab 05/29/20 2135 05/30/20 0139 05/30/20 1516 05/30/20 1655  LATICACIDVEN 2.6* 2.2* 1.9 2.4*    Recent Results (from the past 240 hour(s))  Resp Panel by RT-PCR (Flu  A&B, Covid) Nasopharyngeal Swab     Status: None   Collection Time: 05/29/20  9:49 PM   Specimen: Nasopharyngeal Swab; Nasopharyngeal(NP) swabs in vial transport medium  Result Value Ref Range Status   SARS Coronavirus 2 by RT PCR NEGATIVE NEGATIVE Final    Comment: (NOTE) SARS-CoV-2 target nucleic acids are NOT DETECTED.  The SARS-CoV-2 RNA is generally detectable in upper respiratory specimens during the acute phase of infection. The lowest concentration of SARS-CoV-2 viral copies this assay can detect is 138 copies/mL. A negative result does not preclude SARS-Cov-2 infection and should not be used as the sole basis for treatment or other patient management decisions. A negative result may occur with  improper specimen collection/handling, submission of specimen other than nasopharyngeal swab, presence of viral mutation(s) within the areas targeted by this assay, and inadequate number of viral copies(<138 copies/mL). A negative result must be combined with clinical observations, patient history, and epidemiological information. The expected result is Negative.  Fact Sheet for Patients:  EntrepreneurPulse.com.au  Fact Sheet for Healthcare Providers:  IncredibleEmployment.be  This test is no t yet approved or cleared by the  [place] FDA and  has been authorized for detection and/or diagnosis of SARS-CoV-2 by FDA under an Emergency Use Authorization (EUA). This EUA will remain  in effect (meaning this test can be used) for the duration of the COVID-19 declaration under Section 564(b)(1) of the Act, 21 U.S.C.section 360bbb-3(b)(1), unless the authorization is terminated  or revoked sooner.       Influenza A by PCR NEGATIVE NEGATIVE Final   Influenza B by PCR NEGATIVE NEGATIVE Final    Comment: (NOTE) The Xpert Xpress SARS-CoV-2/FLU/RSV plus assay is intended as an aid in the diagnosis of influenza from Nasopharyngeal swab specimens and should not be used as a sole basis for treatment. Nasal washings and aspirates are unacceptable for Xpert Xpress SARS-CoV-2/FLU/RSV testing.  Fact Sheet for Patients: EntrepreneurPulse.com.au  Fact Sheet for Healthcare Providers: IncredibleEmployment.be  This test is not yet approved or cleared by the [place] FDA and has been authorized for detection and/or diagnosis of SARS-CoV-2 by FDA under an Emergency Use Authorization (EUA). This EUA will remain in effect (meaning this test can be used) for the duration of the COVID-19 declaration under Section 564(b)(1) of the Act, 21 U.S.C. section 360bbb-3(b)(1), unless the authorization is terminated or revoked.  Performed at [facility], [address] [place], [place] [address]   Culture, blood (x 2)     Status: None (Preliminary result)   Collection Time: 05/30/20  5:00 PM   Specimen: BLOOD RIGHT ARM  Result Value Ref Range Status   Specimen Description BLOOD RIGHT ARM  Final   Special Requests   Final    BOTTLES DRAWN AEROBIC ONLY Blood Culture results may not be optimal due to an inadequate volume of blood received in culture bottles   Culture   Final    NO GROWTH 2 DAYS Performed at [facility], [place] [address], [place], [place] [address]    Report  Status PENDING  Incomplete  Culture, blood (x 2)     Status: None (Preliminary result)   Collection Time: 05/30/20  5:03 PM   Specimen: BLOOD RIGHT ARM  Result Value Ref Range Status   Specimen Description BLOOD RIGHT ARM  Final   Special Requests   Final    BOTTLES DRAWN AEROBIC ONLY Blood Culture results may not be optimal due to an inadequate volume of blood received in  culture bottles   Culture   Final    NO GROWTH 2 DAYS Performed at [facility], [place] [address], [place], [place] [address]    Report Status PENDING  Incomplete  Surgical pcr screen     Status: None   Collection Time: 05/31/20  8:24 PM   Specimen: Nasal Mucosa; Nasal Swab  Result Value Ref Range Status   MRSA, PCR NEGATIVE NEGATIVE Final   Staphylococcus aureus NEGATIVE NEGATIVE Final    Comment: (NOTE) The Xpert SA Assay (FDA approved for NASAL specimens in patients 29 years of age and older), is one component of a comprehensive surveillance program. It is not intended to diagnose infection nor to guide or monitor treatment. Performed at [facility], [place] [address] [place],  [address]      RN Pressure Injury Documentation:     Estimated body mass index is 22.62 kg/m as calculated from the following:   Height as of this encounter: '4\' 11"'  (1.499 m).   Weight as of this encounter: 50.8 kg.  Malnutrition Type:  Nutrition Problem: Inadequate oral intake Etiology: altered GI function   Malnutrition Characteristics:  Signs/Symptoms: NPO status   Nutrition Interventions:  Interventions: Refer to RD note for recommendations   Radiology Studies: DG ERCP BILIARY & PANCREATIC DUCTS  Result Date: 05/31/2020 CLINICAL DATA:  ERCP. EXAM: ERCP TECHNIQUE: Multiple spot images obtained with the fluoroscopic device and submitted for interpretation post-procedure. COMPARISON:  MRCP-05/30/2020 FLUOROSCOPY TIME:  2 minutes, 33 seconds (22.1 mGy) FINDINGS: Twelve spot intraoperative  fluoroscopic images of the right upper abdominal quadrant during ERCP are provided for review Initial image demonstrates an ERCP probe overlying the right upper abdominal quadrant Subsequent images demonstrate selective cannulation and opacification of the CBD which appears moderately dilated. There is a lenticular for filling defect within the distal aspect of the CBD likely compatible with questioned choledocholithiasis on preceding MRCP. Subsequent images demonstrate plasty at the level of the biliary ampulla with subsequent biliary sweeping and sphincterotomy. There is minimal opacification the cystic duct and gallbladder lumen. There is minimal opacification of the intrahepatic biliary tree which appears nondilated. There is no definitive opacification of the pancreatic duct. IMPRESSION: ERCP with findings of choledocholithiasis, biliary plasty, sweeping and presumed sphincterotomy. These images were submitted for radiologic interpretation only. Please see the procedural report for the amount of contrast and the fluoroscopy time utilized. Electronically Signed   By: Sandi Mariscal M.D.   On: 05/31/2020 13:09   Scheduled Meds:  [MAR Hold] aspirin EC  81 mg Oral Daily   [MAR Hold] carvedilol  12.5 mg Oral BID   [MAR Hold] insulin glargine  8 Units Subcutaneous QHS   [MAR Hold] levETIRAcetam  250 mg Oral BID   [MAR Hold] ramipril  5 mg Oral Daily   [MAR Hold] sodium chloride flush  3 mL Intravenous Q12H   Continuous Infusions:  sodium chloride 100 mL/hr at 05/31/20 1628   [MAR Hold] cefTRIAXone (ROCEPHIN)  IV 2 g (05/31/20 1629)   lactated ringers 10 mL/hr at 06/01/20 1243    LOS: 2 days   Kerney Elbe, DO [facility] is on AMION  If 7PM-7AM, please contact night-coverage [email or web address]

## 2020-06-01 NOTE — Anesthesia Preprocedure Evaluation (Signed)
Anesthesia Evaluation  Patient identified by MRN, date of birth, ID band Patient awake    Reviewed: Allergy & Precautions, NPO status , Patient's Chart, lab work & pertinent test results  Airway Mallampati: II  TM Distance: >3 FB Neck ROM: Full    Dental  (+) Dental Advisory Given   Pulmonary former smoker,    breath sounds clear to auscultation       Cardiovascular hypertension, Pt. on medications and Pt. on home beta blockers + Peripheral Vascular Disease   Rhythm:Regular Rate:Normal     Neuro/Psych Seizures -,     GI/Hepatic negative GI ROS, Neg liver ROS,   Endo/Other  diabetes, Type 2, Insulin Dependent  Renal/GU Renal InsufficiencyRenal disease     Musculoskeletal  (+) Arthritis ,   Abdominal   Peds  Hematology  (+) anemia ,   Anesthesia Other Findings   Reproductive/Obstetrics                             Anesthesia Physical Anesthesia Plan  ASA: III  Anesthesia Plan: General   Post-op Pain Management:    Induction: Intravenous  PONV Risk Score and Plan: 3 and Dexamethasone, Ondansetron and Treatment may vary due to age or medical condition  Airway Management Planned: Oral ETT  Additional Equipment:   Intra-op Plan:   Post-operative Plan: Extubation in OR  Informed Consent: I have reviewed the patients History and Physical, chart, labs and discussed the procedure including the risks, benefits and alternatives for the proposed anesthesia with the patient or authorized representative who has indicated his/her understanding and acceptance.     Dental advisory given  Plan Discussed with: CRNA  Anesthesia Plan Comments:         Anesthesia Quick Evaluation

## 2020-06-01 NOTE — Anesthesia Procedure Notes (Signed)
Procedure Name: Intubation Date/Time: 06/01/2020 1:02 PM Performed by: Rande Brunt, CRNA Pre-anesthesia Checklist: Patient identified, Emergency Drugs available, Suction available and Patient being monitored Patient Re-evaluated:Patient Re-evaluated prior to induction Oxygen Delivery Method: Circle system utilized Preoxygenation: Pre-oxygenation with 100% oxygen Induction Type: IV induction Ventilation: Mask ventilation without difficulty Laryngoscope Size: Miller and 2 Grade View: Grade I Tube type: Oral Tube size: 7.0 mm Number of attempts: 1 Airway Equipment and Method: Stylet and Oral airway Placement Confirmation: ETT inserted through vocal cords under direct vision,  positive ETCO2 and breath sounds checked- equal and bilateral Secured at: 21 cm Tube secured with: Tape Dental Injury: Teeth and Oropharynx as per pre-operative assessment

## 2020-06-01 NOTE — Addendum Note (Signed)
Addendum  created 06/01/20 0753 by Josephine Igo, CRNA   Intraprocedure Meds edited

## 2020-06-01 NOTE — Discharge Instructions (Addendum)
[facility] ° °Please arrive at least 30 min before your appointment to complete your check in paperwork.  If you are unable to arrive 30 min prior to your appointment time we may have to cancel or reschedule you. °LAPAROSCOPIC SURGERY: POST OP INSTRUCTIONS °Always review your discharge instruction sheet given to you by the facility where your surgery was performed. °IF YOU HAVE DISABILITY OR FAMILY LEAVE FORMS, YOU MUST BRING THEM TO THE OFFICE FOR PROCESSING.   °DO NOT GIVE THEM TO YOUR DOCTOR. ° °PAIN CONTROL ° °1. First take acetaminophen (Tylenol) AND/or ibuprofen (Advil) to control your pain after surgery.  Follow directions on package.  Taking acetaminophen (Tylenol) and/or ibuprofen (Advil) regularly after surgery will help to control your pain and lower the amount of prescription pain medication you may need.  You should not take more than 4,000 mg (4 grams) of acetaminophen (Tylenol) in 24 hours.  You should not take ibuprofen (Advil), aleve, motrin, naprosyn or other NSAIDS if you have a history of stomach ulcers or chronic kidney disease.  °2. A prescription for pain medication may be given to you upon discharge.  Take your pain medication as prescribed, if you still have uncontrolled pain after taking acetaminophen (Tylenol) or ibuprofen (Advil). °3. Use ice packs to help control pain. °4. If you need a refill on your pain medication, please contact your pharmacy.  They will contact our office to request authorization. Prescriptions will not be filled after 5pm or on week-ends. ° °HOME MEDICATIONS °5. Take your usually prescribed medications unless otherwise directed. ° °DIET °6. You should follow a light diet the first few days after arrival home.  Be sure to include lots of fluids daily. Avoid fatty, fried foods.  ° °CONSTIPATION °7. It is common to experience some constipation after surgery and if you are taking pain medication.  Increasing fluid intake and taking a stool  softener (such as Colace) will usually help or prevent this problem from occurring.  A mild laxative (Milk of Magnesia or Miralax) should be taken according to package instructions if there are no bowel movements after 48 hours. ° °WOUND/INCISION CARE °8. Most patients will experience some swelling and bruising in the area of the incisions.  Ice packs will help.  Swelling and bruising can take several days to resolve.  °9. Unless discharge instructions indicate otherwise, follow guidelines below  °a. STERI-STRIPS - you may remove your outer bandages 48 hours after surgery, and you may shower at that time.  You have steri-strips (small skin tapes) in place directly over the incision.  These strips should be left on the skin for 7-10 days.   °b. DERMABOND/SKIN GLUE - you may shower in 24 hours.  The glue will flake off over the next 2-3 weeks. °10. Any sutures or staples will be removed at the office during your follow-up visit. ° °ACTIVITIES °11. You may resume regular (light) daily activities beginning the next day--such as daily self-care, walking, climbing stairs--gradually increasing activities as tolerated.  You may have sexual intercourse when it is comfortable.  Refrain from any heavy lifting or straining until approved by your doctor. °a. You may drive when you are no longer taking prescription pain medication, you can comfortably wear a seatbelt, and you can safely maneuver your car and apply brakes. ° °FOLLOW-UP °12. You should see your doctor in the office for a follow-up appointment approximately 2-3 weeks after your surgery.  You should have been given your post-op/follow-up appointment when   your surgery was scheduled.  If you did not receive a post-op/follow-up appointment, make sure that you call for this appointment within a day or two after you arrive home to insure a convenient appointment time. ° °OTHER INSTRUCTIONS ° °WHEN TO CALL YOUR DOCTOR: °1. Fever over 101.0 °2. Inability to  urinate °3. Continued bleeding from incision. °4. Increased pain, redness, or drainage from the incision. °5. Increasing abdominal pain ° °The clinic staff is available to answer your questions during regular business hours.  Please don’t hesitate to call and ask to speak to one of the nurses for clinical concerns.  If you have a medical emergency, go to the nearest emergency room or call 911.  A surgeon from [facility] is always on call at the hospital. °[address], [address], [place], [place]  [address] ? [address] [address], [place], [place]   [address] °[phone] ? [phone] ? FAX [phone] ° ° ° °

## 2020-06-01 NOTE — Progress Notes (Signed)
Patient received from PACU, patient is still feeling a little groggy and was asking if her surgery was done already, reoriented her and told her that she was back on the unit and her surgery went well, she asked her son what they did to her and he told her that they took her gallbladder out. Will continue to round on patient and reorient as needed.

## 2020-06-02 ENCOUNTER — Inpatient Hospital Stay (HOSPITAL_COMMUNITY): Payer: Medicare Other

## 2020-06-02 ENCOUNTER — Encounter (HOSPITAL_COMMUNITY): Payer: Self-pay | Admitting: Surgery

## 2020-06-02 DIAGNOSIS — R479 Unspecified speech disturbances: Secondary | ICD-10-CM

## 2020-06-02 DIAGNOSIS — R569 Unspecified convulsions: Secondary | ICD-10-CM

## 2020-06-02 DIAGNOSIS — G459 Transient cerebral ischemic attack, unspecified: Secondary | ICD-10-CM

## 2020-06-02 DIAGNOSIS — K8020 Calculus of gallbladder without cholecystitis without obstruction: Secondary | ICD-10-CM | POA: Diagnosis not present

## 2020-06-02 DIAGNOSIS — R4701 Aphasia: Secondary | ICD-10-CM

## 2020-06-02 DIAGNOSIS — N183 Chronic kidney disease, stage 3 unspecified: Secondary | ICD-10-CM | POA: Diagnosis not present

## 2020-06-02 DIAGNOSIS — K831 Obstruction of bile duct: Secondary | ICD-10-CM | POA: Diagnosis not present

## 2020-06-02 LAB — COMPREHENSIVE METABOLIC PANEL
ALT: 89 U/L — ABNORMAL HIGH (ref 0–44)
AST: 70 U/L — ABNORMAL HIGH (ref 15–41)
Albumin: 2.4 g/dL — ABNORMAL LOW (ref 3.5–5.0)
Alkaline Phosphatase: 242 U/L — ABNORMAL HIGH (ref 38–126)
Anion gap: 11 (ref 5–15)
BUN: 37 mg/dL — ABNORMAL HIGH (ref 8–23)
CO2: 18 mmol/L — ABNORMAL LOW (ref 22–32)
Calcium: 7.6 mg/dL — ABNORMAL LOW (ref 8.9–10.3)
Chloride: 111 mmol/L (ref 98–111)
Creatinine, Ser: 1.64 mg/dL — ABNORMAL HIGH (ref 0.44–1.00)
GFR, Estimated: 30 mL/min — ABNORMAL LOW (ref >60)
Glucose, Bld: 243 mg/dL — ABNORMAL HIGH (ref 70–99)
Potassium: 4.8 mmol/L (ref 3.5–5.1)
Sodium: 140 mmol/L (ref 135–145)
Total Bilirubin: 1.4 mg/dL — ABNORMAL HIGH (ref 0.3–1.2)
Total Protein: 5.7 g/dL — ABNORMAL LOW (ref 6.5–8.1)

## 2020-06-02 LAB — GLUCOSE, CAPILLARY
Glucose-Capillary: 173 mg/dL — ABNORMAL HIGH (ref 70–99)
Glucose-Capillary: 216 mg/dL — ABNORMAL HIGH (ref 70–99)
Glucose-Capillary: 201 mg/dL — ABNORMAL HIGH (ref 70–99)
Glucose-Capillary: 180 mg/dL — ABNORMAL HIGH (ref 70–99)
Glucose-Capillary: 178 mg/dL — ABNORMAL HIGH (ref 70–99)

## 2020-06-02 LAB — HEMOGLOBIN A1C
Hgb A1c MFr Bld: 7.9 % — ABNORMAL HIGH (ref 4.8–5.6)
Mean Plasma Glucose: 180.03 mg/dL

## 2020-06-02 LAB — CBC
HCT: 32.1 % — ABNORMAL LOW (ref 36.0–46.0)
Hemoglobin: 10.9 g/dL — ABNORMAL LOW (ref 12.0–15.0)
MCH: 32.8 pg (ref 26.0–34.0)
MCHC: 34 g/dL (ref 30.0–36.0)
MCV: 96.7 fL (ref 80.0–100.0)
Platelets: 124 10*3/uL — ABNORMAL LOW (ref 150–400)
RBC: 3.32 MIL/uL — ABNORMAL LOW (ref 3.87–5.11)
RDW: 12.9 % (ref 11.5–15.5)
WBC: 12.1 10*3/uL — ABNORMAL HIGH (ref 4.0–10.5)
nRBC: 0 % (ref 0.0–0.2)

## 2020-06-02 LAB — SURGICAL PATHOLOGY

## 2020-06-02 LAB — MAGNESIUM: Magnesium: 1.9 mg/dL (ref 1.7–2.4)

## 2020-06-02 LAB — VITAMIN B12: Vitamin B-12: 550 pg/mL (ref 180–914)

## 2020-06-02 LAB — TSH: TSH: 1.712 u[IU]/mL (ref 0.350–4.500)

## 2020-06-02 MED ORDER — SODIUM CHLORIDE 0.9 % IV SOLN: INTRAVENOUS | Status: DC

## 2020-06-02 MED ORDER — INSULIN ASPART 100 UNIT/ML ~~LOC~~ SOLN
0.0000 [IU] | Freq: Every day | SUBCUTANEOUS | Status: DC
  Administered 2020-06-04: 2 [IU] via SUBCUTANEOUS

## 2020-06-02 MED ORDER — INSULIN ASPART 100 UNIT/ML ~~LOC~~ SOLN
0.0000 [IU] | Freq: Three times a day (TID) | SUBCUTANEOUS | Status: DC
  Administered 2020-06-04 (×2): 1 [IU] via SUBCUTANEOUS
  Administered 2020-06-05: 2 [IU] via SUBCUTANEOUS
  Administered 2020-06-05: 1 [IU] via SUBCUTANEOUS
  Administered 2020-06-06 – 2020-06-07 (×4): 2 [IU] via SUBCUTANEOUS
  Administered 2020-06-08: 3 [IU] via SUBCUTANEOUS
  Administered 2020-06-08: 2 [IU] via SUBCUTANEOUS
  Administered 2020-06-09: 3 [IU] via SUBCUTANEOUS
  Administered 2020-06-09: 1 [IU] via SUBCUTANEOUS
  Administered 2020-06-10 (×2): 2 [IU] via SUBCUTANEOUS

## 2020-06-02 NOTE — Progress Notes (Signed)
When administering medications this morning patient had a moment where she was unable to get her words out and form a clear sentence. Then after several attempts to get words out, the only word we got was free, and then she had a moment where she was able to form sentences without an issue and then reverted back to having trouble getting words out.  PT has just reported having the same issue with her,  "Hi the patient has issues of word finding and time to get out a statement, then has normal speech and no problem at times. Family and even pt have noticed this, not sure of reason. LE strength is great, HHPT to go home and pt has walking devices. family going to stay with her initially." - Mee Hives PT  Message sent to Dr. Alfredia Ferguson

## 2020-06-02 NOTE — Progress Notes (Signed)
PROGRESS NOTE    Taylor Castaneda  PJA:[id number] DOB: Jul 30, 1932 DOA: 05/30/2020 PCP: [name], MD   Brief Narrative:  HPI per Dr. Fuller Plan on 05/30/20 LITTIE CHIEM is a 86 y.o. female with medical history significant of diabetes mellitus type 2, hyperlipidemia, seizure disorder, history colon cancer s/p resection, and anemia presented with complaints of passing out while at her condo.  The patient's daughter found her on the floor and was able to help her get to a chair.  However when EMS arrived patient had a witnessed generalized tonic-clonic seizure with urinary incontinence lasting approximately 1 minute prior to self resolving.  They did not have to give any abortive medications.  She had been compliant with her medications and last reported having a seizure over a year ago.  At baseline patient lives alone and handles all of her own medications.  Patient had not been feeling well earlier in the day with complaints of epigastric abdominal pain with radiation to her back.  Denied any recent trauma prior to onset of symptoms.  Symptoms lasted approximately 6 to 7 hours prior to self resolving.  She had taken Tums and Tylenol prior to coming into the hospital. CT scan of the head and cervical spine did not note any acute abnormalities.  CT imaging of the thoracic and lumbar spine noted severe spinal stenosis at L4-5 and large nonobstructing left renal stone.  Bowel sounds were noted to be stable.  Labs at the outside facility yesterday had been significant for WBC 17.8, hemoglobin 11.4, BUN 37, creatinine 1.4, alkaline phosphatase 283, AST 315, ALT 235, total bilirubin 3.3 and lactic acid initially 4->2.6.  COVID-19 and influenza screening was negative.  Right upper quadrant ultrasound revealed cholelithiasis without evidence of acute cholecystitis and cirrhosis.  Case had been discussed with GI here at [facility] who recommended MRCP which revealed cholelithiasis without signs of acute  cholecystitis, suspected small common bile duct stones distally with no duct dilatation, and pancreatic atrophy with dilatation in the mid body tail region with abrupt transition point concerning for possible stricture.  She had been given Keppra 1000 mg IV and 500 mL of normal saline IV fluids.  Patient had been accepted to the hospitalist service to a medical telemetry bed.  Repeat labs revealed WBC 25.6, hemoglobin 10.7, alkaline phosphatase 270, AST 219, ALT 207, and total bilirubin 3.7, lactic acid 2.2.  **Interim History  Patient underwent an ERCP today and she had a periampullary diverticulum and she was noted to have choledocholithiasis which is status post biliary sphincterotomy and sphincteroplasty and balloon extraction.  She had cholelithiasis without cholecystitis and she had a mild gastritis.  Pancreatogram was not obtained and GI recommended watching for pancreatitis, bleeding or perforation and cholangitis.  Recommend a clear liquid diet and continue current medications and continue antibiotics for 5 days total and stopping the Flagyl.  He also recommended obtaining a surgical consultation while inpatient for questionable lap chole and following the results of the CA 19-9, CEA, IgG for.  Dr. Lyndel Safe feels that she will require an EUS as an outpatient for evaluation of pancreatic duct stricture.  After procedure she was sedated and very somnolent and drowsy and unable to be aroused given that she was asleep  General surgery was consulted on 05/31/2020 and after discussion with the patient they recommended a cholecystectomy which the patient was agreeable to.  Patient underwent a cholecystectomy on 06/01/2020 and is doing well postoperatively.  This morning she is slated to  be discharged however started having some word finding difficulties and could not articulate her words properly.  This was an acute change from her baseline and daughter was at bedside.  When I went to evaluate her if she was  talking normally but did also have some disarticulation of her words and after discussion with neurology a code stroke was called.  Neurology evaluated and symptoms had resolved but they felt she still needed further imaging so we are obtaining MRI of the brain and she was transferred to [address] for neuro checks closely.  Assessment & Plan:   Principal Problem:   Cholelithiasis Active Problems:   Hyperlipidemia associated with type 2 diabetes mellitus (HCC)   CKD (chronic kidney disease), stage III (HCC)   Hyperbilirubinemia   Normocytic anemia   Transaminitis   Pancreatic duct dilated   Common bile duct (CBD) obstruction   Cholelithiasis and choledocholithiasis status post ERCP on 05/31/2020 and now cholecystectomy postoperative day 1 Obstructive Jaundice with Hyperbilirubinemia and Abnormal LFTs, improving -Acute.  Patient present with complaints of abdominal pain with radiation to her back.   -Initial CT imaging studies significant for cholelithiasis without signs of cholecystitis and cirrhosis.   -GI has been initially consulted and MRCP was recommended.  MRCP revealed cholelithiasis without cholecystitis, small gallstones distally without clear duct dilatation, pancreatic duct dilatation in the mid body region with an abrupt transition, but no obvious obstructing lesion.   -Patient transferred for need of ERCP and this was done today  -Admit to a medical telemetry -Check blood cultures and showed NGTD at 3 days -Diet had been advanced but she was made n.p.o. when she started having difficulty with her speech and word finding -Start empiric antibiotics of Rocephin and metronidazole; GI recommending stopping Flagyl  -Follow up on CA19-9 was less than 2, CEA was 3.2, IgG4 was 49 -[facility] consult, will follow-up for further recommendations -Underwent ERCP today and she had a periampullary diverticulum and she was noted to have choledocholithiasis which is status post biliary  sphincterotomy and sphincteroplasty and balloon extraction.  She had cholelithiasis without cholecystitis and she had a mild gastritis.   -Pancreatogram was not obtained and GI recommended watching for pancreatitis, bleeding or perforation and cholangitis.   -Dr. Lyndel Safe Recommend a clear liquid diet and continue current medications and continue antibiotics for 5 days total and stopping the Flagyl. He also recommended obtaining a surgical consultation while inpatient for questionable lap chole and following the results of the CA 19-9, CEA, IgG4. -T Bili went from 3.3 -> 3.7 -> 3.1 and it is now 3.0 yesterday and today it is 1.4 -General Surgery consulted for further evaluation and they they discussed the possibility of cholecystectomy with the patient.  She is agreeable to surgical intervention and decided to proceed with cholecystectomy and this was done on 06/01/2020 -Will need PT/OT to evaluate and Treat to determine safe discharge disposition and this can be done after her surgery -Surgery has evaluated patient and cleared her for discharge and PT OT recommending home health PT and OT  Expressive Aphasia -Patient had significant difficulty time finding her words today and when she did she repeat herself and kept saying "free" -When I went to evaluate her she was improving but still had some difficulty articulating her words -Spoke with neurology who recommended calling a code stroke and a stat head CT was ordered but when she was evaluated by neurology her symptoms resolved so code stroke was canceled and MRI was ordered -patient  was transferred to [address] for continuing neurochecks every 2 hours next-continue monitor carefully and follow-up with neurology recommendations and follow-up on MRI -EEG will be ordered to rule out Seizures -Follow-up on neurology recommendations  Pancreatic Duct Dilatation  -As seen above.  Lipase was limits and no significant signs of inflammation noted on imaging. -CT  of the abdomen with and without contrast -Further Care Per GI -Dr. Lyndel Safe feels that she will require an EUS as an outpatient for evaluation of pancreatic duct stricture as she did not have a Pancreatogram done here  Leukocytosis and lactic acidosis, improving -Acute.  -WBC elevated from 17.8 to 25.6 with lactic acid initially elevated up to 4, but slowly trending down.  -Patient vital signs were noted to be relatively within normal limits not necessarily meeting sepsis criteria. -WBC was trending down again and went from 13.2 is now 12.1 -LA is trending down and LA was last 2.4 -Follow-up blood cultures and pending  -Check urinalysis and culture if necessary -Continue IV fluids and reduce and stop after 1 more day -Trend lactic acid levels  Seizure Disorder -Patient had witnessed generalized tonic-clonic seizure lasting 1 minute with continence of urine yesterday.   -Suspect this could have been the reason patient had fallen on the floor as well.   -Home medications include Keppra 250 mg twice daily. Patient had received 1000 mg IV yesterday. -Seizure precaution  -Continue home regimen of Keppra for now  -May need to get an EEG and discussion with neurology.  We will order an EEG given her difficulty finding her words and articulating them this morning concern for a CVA -MRI of the brain is also being obtained  Essential Hypertension -Home blood pressure medications include Altace 5 mg daily, Coreg 12.5 mg twice daily, and furosemide 20 mg daily. -Continue to Hold furosemide but resumed Ramipril 5 mg po Daily -Continue Carvedilol 12.5 mg po BID -Continue to Monitor BP per Protocol  -Last blood pressure was 156/82  Acute kidney injury superimposed on chronic kidney disease IIIb Metabolic acidosis -Creatinine was initially noted to be 1.4 (around patient's baseline), but repeat check today 1.76 with BUN 34 on admission; Now BUN/Cr was 31/1.68 a day before yesterday and was 33/1.65  yesterday and today it is 37/1.64 -Check CK and was 75 -Patient has a small acidosis now with a CO2 of 18, chloride level 112, and anion gap of 12 -IV fluid hydration is now stopped -Avoid nephrotoxic medications, contrast dyes, hypotension and renally dose medications -Repeat CMP in a.m.  Normocytic Anemia -Acute.     -Hemoglobin/hematocrit is relatively stable at 10.9/32.1 -Check anemia panel and showed an iron level of 50, TIBC 105, TIBC 155, saturation ratio 32%, ferritin level of 393, folate of 21.4, and vitamin B12 of 588 -Continue to monitor for signs and symptoms of bleeding; currently no overt bleeding noted -Repeat CBC in a.m.  Diabetes mellitus type 2, uncontrolled -On admission glucose elevated up to 205.  Last hemoglobin A1c noted to be 8.7 on 02/12/2020.  Home medication includes Lantus 16 units nightly. -Hypoglycemic protocol -Check hemoglobin A1c in the AM  -Continue half dose of Lantus -CBGs before every meal with moderate SSI  -Diabetes education coronary is following closely -CBGs ranging from 155-229  Abnormal LFTs and Hyperbilirubinemia, slowly improving -Acute.  Patient found to have AST 315->219 -> 86 -> 70 -> 70, ALT 235->207 -> 119 -> 93 -> 89, and total bilirubin 3.3->3.7 -> 3.1 -> 3.0 -> 1.4. -Continue to monitor and  trend Hepatic Fxn Panel -Repeat CMP in the AM  Elevated Alk Phos -Was elevated to 282 on admission is trended down and today is 235 -Continue to monitor and trend repeat CMP in a.m.  Hyperlipidemia -Hold Atorvastatin due to elevated liver enzymes and resume once they are normalized  DVT prophylaxis: SCDs Code Status: FULL CODE Family Communication: Spoke with daughter at the bedside Disposition Plan: Pending further evaluation by PT OT, clearance by gastroenterology as well as general surgery.  Now that she had some neurological issues will transfer to [address] and obtain further head imaging and discussed with neurology.  She will need  neurology clearance prior to safe discharge disposition and likely will go home with home health  Status is: Inpatient  Remains inpatient appropriate because:Unsafe d/c plan, IV treatments appropriate due to intensity of illness or inability to take PO and Inpatient level of care appropriate due to severity of illness   Dispo: The patient is from: Home              Anticipated d/c is to: SNF              Anticipated d/c date is: 2 days              Patient currently is not medically stable to d/c.  Consultants:   Gastroenterology  General Surgery    Procedures:  ERCP Findings:      The scout film was normal. The upper GI tract was traversed under direct       vision without detailed examination. Mild antral gastritis was noted.      The major papilla was located at lower lip of periampullary diverticula.       The bile duct was deeply cannulated with the short-nosed traction       sphincterotome. Contrast was injected. I personally interpreted the bile       duct images. There was brisk flow of contrast through the ducts. Image       quality was adequate. Contrast extended to the entire biliary tree. 3       filling defects c/w choledocholithiasis was found in CBD which was       mildly dilated to 10 mm. Low insertion of cystic duct was noted. Cystic       duct did fill. Multiple filling defects in the gallbladder consistent       with cholelithiasis.      A 6 mm biliary sphincterotomy was made with a monofilament traction       (standard) sphincterotome using ERBE electrocautery at 12 o'clock       position. There was no post-sphincterotomy bleeding. Due to       periampullary diverticula, we elected to proceed with sphincteroplasty.       Sphincteroplasty was performed using 03-15-11 mm x 5.5 cm CRE balloon to       a maximum balloon size of 10 mm. The biliary tree was swept multiple       times with a 12 mm balloon starting at the bifurcation. All 3 stones       were  removed. Nothing was found on postocclusion cholangiogram. The bile       was green. There was no pus.      Pancreatogram was not obtained. We did try few times but the wire would       not find pancreatic duct after the sphincterotomy.      Mild antral gastritis was biopsied with a  cold forceps for histology to       r/o HP. Impression:               -Periampullary diverticulum.                           -Choledocholithiasis s/p biliary sphincterotomy,                            sphincteroplasty and balloon extraction.                           -Cholelithiasis without cholecystitis.                           -Mild gastritis.                           -Pancreatogram not obtained. Recommendation:           - Watch for pancreatitis, bleeding, perforation,                            and cholangitis.                           - Clear liquid diet.                           - Continue present medications.                           - Continue antibiotics x total of 5 days. Can stop                            Flagyl.                           - Can obtain surgical consultation while inpatient                            ?Lap chole.                           - Follow results of CA 19?9/CEA/IgG4. I do believe                            she will require EUS as an outpatient for                            evaluation of PD stricture. I will run it by Dr.                            Wendie Agreste. Mansouraty                           - The findings and recommendations were discussed  with the patient's family.  Laparoscopic cholecystectomy was performed by Dr. Romana Juniper on 06/01/2020  Antimicrobials: Anti-infectives (From admission, onward)   Start     Dose/Rate Route Frequency Ordered Stop   05/30/20 1615  cefTRIAXone (ROCEPHIN) 2 g in sodium chloride 0.9 % 100 mL IVPB        2 g 200 mL/hr over 30 Minutes Intravenous Every 24 hours 05/30/20 1516     05/30/20 1615   metroNIDAZOLE (FLAGYL) tablet 500 mg  Status:  Discontinued        500 mg Oral Every 8 hours 05/30/20 1516 05/31/20 1611        Subjective: Seen And examined at bedside this a.m. she was doing well and was stated to be discharged once PT OT evaluated her but that subsequently this afternoon she was started having expressive aphasia and cannot articulate her words and had a very difficult time speaking.  After I reevaluated her, her speech was somewhat normal but she still had some difficulty speaking so code stroke was called.  Neurology was consulted and her symptoms have not completely resolved by the time neurology evaluated herself code stroke was canceled and MRI was ordered for further evaluation.  EEG will also be ordered.  She is transferred to [address] and family was updated and at bedside.  Objective: Vitals:   06/02/20 1314 06/02/20 1316 06/02/20 1400 06/02/20 1510  BP: (!) 158/85 (!) 143/71 (!) 167/67 (!) 156/82  Pulse: 66 70 (!) 59 64  Resp: _0 Temp:    98.3 F (36.8 C)  TempSrc:    Oral  SpO2: 100% 100% 100% 100%  Weight:      Height:        Intake/Output Summary (Last 24 hours) at 06/02/2020 1605 Last data filed at 06/02/2020 0900 Gross per 24 hour  Intake 1490.71 ml  Output 695 ml  Net 795.71 ml   Filed Weights   05/30/20 1645 05/31/20 0912 06/01/20 1045  Weight: 50.8 kg 50.8 kg 50.8 kg   Examination: Physical Exam:  Constitutional: WN/WD elderly Caucasian female who is slightly jaundiced in no acute distress appears calm comfortable and is much improved from yesterday and then today but then subsequently later this afternoon had a very difficult time finding her words Eyes: Lids and conjunctivae normal, sclerae anicteric  ENMT: External Ears, Nose appear normal. Grossly normal hearing.  Neck: Appears normal, supple, no cervical masses, normal ROM, no appreciable thyromegaly; no JVD Respiratory: Clear to auscultation bilaterally, no wheezing, rales,  rhonchi or crackles. Normal respiratory effort and patient is not tachypenic. No accessory muscle use.  Diminished unlabored breathing Cardiovascular: RRR, no murmurs / rubs / gallops. S1 and S2 auscultated.  No appreciable extremity edema Abdomen: Soft, non-tender, non-distended.  Incisions appear clean dry and intact.  Bowel sounds positive.  GU: Deferred. Musculoskeletal: No clubbing / cyanosis of digits/nails. No joint deformity upper and lower extremities.  Skin: No rashes, lesions, ulcers on to skin evaluation. No induration; Warm and dry.  Neurologic: CN 2-12 grossly intact with no focal deficits but then this afternoon had very difficult time articulating her words. Romberg sign and cerebellar reflexes not assessed.  Psychiatric: Normal judgment and insight. Alert and oriented x 3. Normal mood and appropriate affect.   Data Reviewed: I have personally reviewed following labs and imaging studies  CBC: Recent Labs  Lab 05/29/20 1932 05/30/20 0139 05/31/20 0043 06/01/20 0035 06/02/20 0022  WBC 17.8* 25.6* 12.2* 13.2* 12.1*  NEUTROABS  16.3* 23.7*  --  11.6*  --   HGB 11.4* 10.7* 9.0* 10.0* 10.9*  HCT 35.0* 31.4* 27.5* 29.8* 32.1*  MCV 98.3 95.7 97.2 98.3 96.7  PLT 156 147* 121* 113* 417*   Basic Metabolic Panel: Recent Labs  Lab 05/29/20 1932 05/30/20 1655 05/31/20 0043 06/01/20 0035 06/02/20 0022  NA 139 140 141 142 140  K 4.3 4.4 4.0 4.8 4.8  CL 104 103 107 112* 111  CO2 _0 18* 18*  GLUCOSE 205* 157* 193* 255* 243*  BUN 37* 34* 31* 33* 37*  CREATININE 1.40* 1.76* 1.68* 1.65* 1.64*  CALCIUM 8.8* 8.7* 8.2* 7.8* 7.6*  MG  --   --   --  1.8 1.9  PHOS  --   --   --  4.3  --    GFR: Estimated Creatinine Clearance: 17.1 mL/min (A) (by C-G formula based on SCr of 1.64 mg/dL (H)). Liver Function Tests: Recent Labs  Lab 05/29/20 1932 05/30/20 0139 05/31/20 0043 06/01/20 0035 06/02/20 0022  AST 315* 219* 86* 70* 70*  ALT 235* 207* 119* 93* 89*  ALKPHOS 282*  270* 208* 235* 242*  BILITOT 3.3* 3.7* 3.1* 3.0* 1.4*  PROT 6.3* 6.2* 5.1* 5.2* 5.7*  ALBUMIN 3.2* 3.1* 2.4* 2.3* 2.4*   Recent Labs  Lab 05/29/20 2230  LIPASE 25   No results for input(s): AMMONIA in the last 168 hours. Coagulation Profile: No results for input(s): INR, PROTIME in the last 168 hours. Cardiac Enzymes: Recent Labs  Lab 05/30/20 1717  CKTOTAL 75   BNP (last 3 results) No results for input(s): PROBNP in the last 8760 hours. HbA1C: No results for input(s): HGBA1C in the last 72 hours. CBG: Recent Labs  Lab 06/01/20 1647 06/01/20 2101 06/02/20 0756 06/02/20 1204 06/02/20 1308  GLUCAP 167* 229* 173* 216* 201*   Lipid Profile: No results for input(s): CHOL, HDL, LDLCALC, TRIG, CHOLHDL, LDLDIRECT in the last 72 hours. Thyroid Function Tests: No results for input(s): TSH, T4TOTAL, FREET4, T3FREE, THYROIDAB in the last 72 hours. Anemia Panel: Recent Labs    06/01/20 0035  VITAMINB12 588  FOLATE 21.4  FERRITIN 393*  TIBC 155*  IRON 50  RETICCTPCT 1.2   Sepsis Labs: Recent Labs  Lab 05/29/20 2135 05/30/20 0139 05/30/20 1516 05/30/20 1655  LATICACIDVEN 2.6* 2.2* 1.9 2.4*    Recent Results (from the past 240 hour(s))  Resp Panel by RT-PCR (Flu A&B, Covid) Nasopharyngeal Swab     Status: None   Collection Time: 05/29/20  9:49 PM   Specimen: Nasopharyngeal Swab; Nasopharyngeal(NP) swabs in vial transport medium  Result Value Ref Range Status   SARS Coronavirus 2 by RT PCR NEGATIVE NEGATIVE Final    Comment: (NOTE) SARS-CoV-2 target nucleic acids are NOT DETECTED.  The SARS-CoV-2 RNA is generally detectable in upper respiratory specimens during the acute phase of infection. The lowest concentration of SARS-CoV-2 viral copies this assay can detect is 138 copies/mL. A negative result does not preclude SARS-Cov-2 infection and should not be used as the sole basis for treatment or other patient management decisions. A negative result may occur with   improper specimen collection/handling, submission of specimen other than nasopharyngeal swab, presence of viral mutation(s) within the areas targeted by this assay, and inadequate number of viral copies(<138 copies/mL). A negative result must be combined with clinical observations, patient history, and epidemiological information. The expected result is Negative.  Fact Sheet for Patients:  EntrepreneurPulse.com.au  Fact Sheet for Healthcare Providers:  IncredibleEmployment.be  This test is no t yet approved or cleared by the [place] and  has been authorized for detection and/or diagnosis of SARS-CoV-2 by FDA under an Emergency Use Authorization (EUA). This EUA will remain  in effect (meaning this test can be used) for the duration of the COVID-19 declaration under Section 564(b)(1) of the Act, 21 U.S.C.section 360bbb-3(b)(1), unless the authorization is terminated  or revoked sooner.       Influenza A by PCR NEGATIVE NEGATIVE Final   Influenza B by PCR NEGATIVE NEGATIVE Final    Comment: (NOTE) The Xpert Xpress SARS-CoV-2/FLU/RSV plus assay is intended as an aid in the diagnosis of influenza from Nasopharyngeal swab specimens and should not be used as a sole basis for treatment. Nasal washings and aspirates are unacceptable for Xpert Xpress SARS-CoV-2/FLU/RSV testing.  Fact Sheet for Patients: EntrepreneurPulse.com.au  Fact Sheet for Healthcare Providers: IncredibleEmployment.be  This test is not yet approved or cleared by the [place] FDA and has been authorized for detection and/or diagnosis of SARS-CoV-2 by FDA under an Emergency Use Authorization (EUA). This EUA will remain in effect (meaning this test can be used) for the duration of the COVID-19 declaration under Section 564(b)(1) of the Act, 21 U.S.C. section 360bbb-3(b)(1), unless the authorization is terminated  or revoked.  Performed at [facility], [address] [place], [place] [address]   Culture, blood (x 2)     Status: None (Preliminary result)   Collection Time: 05/30/20  5:00 PM   Specimen: BLOOD RIGHT ARM  Result Value Ref Range Status   Specimen Description BLOOD RIGHT ARM  Final   Special Requests   Final    BOTTLES DRAWN AEROBIC ONLY Blood Culture results may not be optimal due to an inadequate volume of blood received in culture bottles   Culture   Final    NO GROWTH 3 DAYS Performed at [facility], [place] [address], [place], [place] [address]    Report Status PENDING  Incomplete  Culture, blood (x 2)     Status: None (Preliminary result)   Collection Time: 05/30/20  5:03 PM   Specimen: BLOOD RIGHT ARM  Result Value Ref Range Status   Specimen Description BLOOD RIGHT ARM  Final   Special Requests   Final    BOTTLES DRAWN AEROBIC ONLY Blood Culture results may not be optimal due to an inadequate volume of blood received in culture bottles   Culture   Final    NO GROWTH 3 DAYS Performed at [facility], [place] [address] [place], [place] [id number]    Report Status PENDING  Incomplete  Surgical pcr screen     Status: None   Collection Time: 05/31/20  8:24 PM   Specimen: Nasal Mucosa; Nasal Swab  Result Value Ref Range Status   MRSA, PCR NEGATIVE NEGATIVE Final   Staphylococcus aureus NEGATIVE NEGATIVE Final    Comment: (NOTE) The Xpert SA Assay (FDA approved for NASAL specimens in patients 85 years of age and older), is one component of a comprehensive surveillance program. It is not intended to diagnose infection nor to guide or monitor treatment. Performed at [facility], [place] [address] [place], [place] [address]      RN Pressure Injury Documentation:     Estimated body mass index is 22.62 kg/m as calculated from the following:   Height as of this encounter: _0  (1.499 m).   Weight as of this encounter: 50.8 kg.  Malnutrition  Type:  Nutrition Problem: Inadequate oral intake Etiology: altered GI function   Malnutrition Characteristics:  Signs/Symptoms: NPO status   Nutrition Interventions:  Interventions: Refer to RD note for recommendations   Radiology Studies: No results found. Scheduled Meds: . acetaminophen  1,000 mg Oral Q6H  . aspirin EC  81 mg Oral Daily  . carvedilol  12.5 mg Oral BID  . insulin glargine  8 Units Subcutaneous QHS  . levETIRAcetam  250 mg Oral BID  . ramipril  5 mg Oral Daily  . sodium chloride flush  3 mL Intravenous Q12H   Continuous Infusions: . sodium chloride    . cefTRIAXone (ROCEPHIN)  IV 2 g (06/01/20 1507)  . lactated ringers 10 mL/hr at 06/01/20 1243    LOS: 3 days   Kerney Elbe, DO [facility] PAGER is on AMION  If 7PM-7AM, please contact night-coverage [email or web address]

## 2020-06-02 NOTE — Care Management Important Message (Signed)
Important Message  Patient Details  Name: Taylor Castaneda MRN: [id number] Date of Birth: 1932/09/01   Medicare Important Message Given:  Yes     Orbie Pyo 06/02/2020, 4:19 PM

## 2020-06-02 NOTE — Plan of Care (Signed)
  Problem: Nutrition Goal: Nutritional status is improving Description: Monitor and assess patient for malnutrition (ex- brittle hair, bruises, dry skin, pale skin and conjunctiva, muscle wasting, smooth red tongue, and disorientation). Collaborate with interdisciplinary team and initiate plan and interventions as ordered.  Monitor patient's weight and dietary intake as ordered or per policy. Utilize nutrition screening tool and intervene per policy. Determine patient's food preferences and provide high-protein, high-caloric foods as appropriate.  Outcome: Progressing   Problem: Education: Goal: Knowledge of General Education information will improve Description: Including pain rating scale, medication(s)/side effects and non-pharmacologic comfort measures Outcome: Progressing   Problem: Health Behavior/Discharge Planning: Goal: Ability to manage health-related needs will improve Outcome: Progressing   Problem: Clinical Measurements: Goal: Ability to maintain clinical measurements within normal limits will improve Outcome: Progressing Goal: Will remain free from infection Outcome: Progressing Goal: Diagnostic test results will improve Outcome: Progressing Goal: Respiratory complications will improve Outcome: Progressing Goal: Cardiovascular complication will be avoided Outcome: Progressing   Problem: Activity: Goal: Risk for activity intolerance will decrease Outcome: Progressing   Problem: Nutrition: Goal: Adequate nutrition will be maintained Outcome: Progressing   Problem: Coping: Goal: Level of anxiety will decrease Outcome: Progressing   Problem: Elimination: Goal: Will not experience complications related to bowel motility Outcome: Progressing Goal: Will not experience complications related to urinary retention Outcome: Progressing   Problem: Pain Managment: Goal: General experience of comfort will improve Outcome: Progressing   Problem: Safety: Goal: Ability to  remain free from injury will improve Outcome: Progressing   Problem: Skin Integrity: Goal: Risk for impaired skin integrity will decrease Outcome: Progressing   Problem: Education: Goal: Required Educational Video(s) Outcome: Progressing   Problem: Clinical Measurements: Goal: Postoperative complications will be avoided or minimized Outcome: Progressing   Problem: Skin Integrity: Goal: Demonstration of wound healing without infection will improve Outcome: Progressing

## 2020-06-02 NOTE — Consult Note (Signed)
Referring Physician: Dr. Alfredia Ferguson    Chief Complaint: Acute onset of aphasia  HPI: Taylor Castaneda is an 86 y.o. female with a history of seizures and colon CA s/p resection, who presented to [facility] on 12/26 after she was found on the ground by family. The patient was awake, but could not get up on her own. There was no evidence for trauma. It was uncertain what caused her to fall. When EMS arrived, the patient was in a chair "awake but slow". She then had a generalized seizure with bladder incontinence but no tongue biting, which lasted for about 1 minute and resolved on its own. There was no significant postictal period. ED work up revealed lactate of 4 and elevated WBC. Imaging for abdominal pain revealed mild acute cholecystitis. She was then transferred to [facility] for possible surgery. Her home dosing regimen of Keppra 250 mg BID was continued on admission. She underwent laparascopic cholecystectomy without complications on 69/48.   Today, the patient had an episode of aphasia which was documented by her RN as follows: "When administering medications this morning patient had a moment where she was unable to get her words out and form a clear sentence. Then after several attempts to get words out, the only word we got was free, and then she had a moment where she was able to form sentences without an issue and then reverted back to having trouble getting words out." PT had reported having noticed a similar deficit today. Hospitalist came to bedside to see and witnessed dysphasia as well. Code Stroke was then called.   LSN: [id number] tPA Given: No: Symptoms resolved  Past Medical History:  Diagnosis Date  . Anemia   . Diabetes mellitus, type II ([place])   . Glaucoma    Dr.Hecker  . History of colon cancer 1998   Dr. Lennie Hummer  . Hyperlipemia   . Osteopenia 02/2012, 03/2014   DEXA hip -2.2  . Renal insufficiency   . Seizures ([place])   . Stenosing tenosynovitis of finger of left hand 2016   index - s/p steroid  injection x2    Past Surgical History:  Procedure Laterality Date  . ABI  11/2013   WNL  . APPENDECTOMY    . BILIARY DILATION  05/31/2020   Procedure: BILIARY DILATION;  Surgeon: Jackquline Denmark, MD;  Location: [facility] ENDOSCOPY;  Service: Endoscopy;;  . BIOPSY  05/31/2020   Procedure: BIOPSY;  Surgeon: Jackquline Denmark, MD;  Location: [place];  Service: Endoscopy;;  . CARPAL TUNNEL RELEASE     Bilateral   . CATARACT EXTRACTION Bilateral 2006   Implants  . CHOLECYSTECTOMY N/A 06/01/2020   Procedure: LAPAROSCOPIC CHOLECYSTECTOMY;  Surgeon: Clovis Riley, MD;  Location: [place];  Service: General;  Laterality: N/A;  . COLECTOMY  1998   For cancer  . COLONOSCOPY  2008   Dr.Stark, [place] 2011  . COLONOSCOPY  06/2013   1 hyperplastic polyp, ileocecal anastomosis [facility])  . dexa  02/2012, 03/2014   T score -2.2 at hip overall stable  . ENDOSCOPIC RETROGRADE CHOLANGIOPANCREATOGRAPHY (ERCP) WITH PROPOFOL N/A 05/31/2020   Procedure: ENDOSCOPIC RETROGRADE CHOLANGIOPANCREATOGRAPHY (ERCP) WITH PROPOFOL;  Surgeon: Jackquline Denmark, MD;  Location[facility] ENDOSCOPY;  Service: Endoscopy;  Laterality: N/A;  . REFRACTIVE SURGERY    . REMOVAL OF STONES  05/31/2020   Procedure: REMOVAL OF STONES;  Surgeon: Jackquline Denmark, MD;  Location: [facility] ENDOSCOPY;  Service: Endoscopy;;  . Taylor Castaneda  05/31/2020   Procedure: Taylor Castaneda;  Surgeon: Jackquline Denmark, MD;  Location: [facility] ENDOSCOPY;  Service: Endoscopy;;  . TONSILLECTOMY AND ADENOIDECTOMY    . TOTAL KNEE ARTHROPLASTY Left 12/17/2016   Procedure: LEFT TOTAL KNEE ARTHROPLASTY;  Surgeon: Gaynelle Arabian, MD;  Location: [facility] ORS;  Service: Orthopedics;  Laterality: Left;    Family History  Problem Relation Age of Onset  . Heart attack Father 75  . Stroke Father 33  . Diabetes Father   . Hypertension Father   . Heart disease Father        before age 36  . Breast cancer Mother   . Cancer Mother   . Diabetes Sister   . Cancer Sister   . Peripheral vascular disease  Sister   . Breast cancer Sister   . Hypertension Son   . COPD Neg Hx   . Asthma Neg Hx   . Colon cancer Neg Hx   . Esophageal cancer Neg Hx   . Rectal cancer Neg Hx   . Stomach cancer Neg Hx    Social History:  reports that she quit smoking about 47 years ago. Her smoking use included cigarettes. She quit after 2.00 years of use. She has never used smokeless tobacco. She reports previous alcohol use. She reports that she does not use drugs.  Allergies:  Allergies  Allergen Reactions  . Gabapentin Other (See Comments)    Dizziness  . Pravastatin Sodium Other (See Comments)    "Anemia," per Dr Gilford Rile, [facility]    Medications:  Scheduled: . acetaminophen  1,000 mg Oral Q6H  . aspirin EC  81 mg Oral Daily  . carvedilol  12.5 mg Oral BID  . insulin aspart  0-5 Units Subcutaneous QHS  . [START ON 06/03/2020] insulin aspart  0-9 Units Subcutaneous TID WC  . insulin glargine  8 Units Subcutaneous QHS  . levETIRAcetam  250 mg Oral BID  . ramipril  5 mg Oral Daily  . sodium chloride flush  3 mL Intravenous Q12H   Continuous: . sodium chloride 100 mL/hr at 06/02/20 1628  . cefTRIAXone (ROCEPHIN)  IV 2 g (06/02/20 1656)  . lactated ringers 10 mL/hr at 06/01/20 1243    ROS: As per HPI. Also denies any facial weakness, vision change, limb weakness or limb numbness.   Physical Examination: Blood pressure 139/64, pulse 61, temperature 97.8 F (36.6 C), temperature source Oral, resp. rate 20, height 4\' 11"  (1.499 m), weight 50.8 kg, SpO2 99 %.  HEENT: [place]/AT Lungs: Respirations unlabored Ext: No edema  Neurologic Examination: Ment: Awake and alert. Oriented to "November" and did not identify the year correctly, but did give the correct day as well as the city and the state. She is well oriented to her current hospitalization and circumstances. Speech is fluent with intact comprehension and naming for common items, but required circumlocutory speech to verbally identify a badge. Able  to recite the months of the year forwards quickly and correctly, but became confused when attempting to recite months backwards. No paraphasias noted.  CN: PERRL. VFF. EOMI. Temp sensation intact. Smile symmetric. Phonation intact.  Motor: 5/5 x 4 Sensory: FT and temp intact x 4. No definite extinction to DSS.  Reflexes: Normoactive.  Cerebellar: No ataxia with FNF bilaterally, but movements are slow and deliberate. The patient initially seemed to have trouble grasping the essence of the task.  Gait: Sits stably without assistance. Stands with own power.    Results for orders placed or performed during the hospital encounter of 05/30/20 (from the past 48 hour(s))  CBC with Differential/Platelet  Status: Abnormal   Collection Time: 06/01/20 12:35 AM  Result Value Ref Range   WBC 13.2 (H) 4.0 - 10.5 K/uL   RBC 3.03 (L) 3.87 - 5.11 MIL/uL   Hemoglobin 10.0 (L) 12.0 - 15.0 g/dL   HCT 29.8 (L) 36.0 - 46.0 %   MCV 98.3 80.0 - 100.0 fL   MCH 33.0 26.0 - 34.0 pg   MCHC 33.6 30.0 - 36.0 g/dL   RDW 12.7 11.5 - 15.5 %   Platelets 113 (L) 150 - 400 K/uL    Comment: REPEATED TO VERIFY PLATELET COUNT CONFIRMED BY SMEAR Immature Platelet Fraction may be clinically indicated, consider ordering this additional test [id number]    nRBC 0.0 0.0 - 0.2 %   Neutrophils Relative % 88 %   Neutro Abs 11.6 (H) 1.7 - 7.7 K/uL   Lymphocytes Relative 6 %   Lymphs Abs 0.8 0.7 - 4.0 K/uL   Monocytes Relative 5 %   Monocytes Absolute 0.6 0.1 - 1.0 K/uL   Eosinophils Relative 0 %   Eosinophils Absolute 0.0 0.0 - 0.5 K/uL   Basophils Relative 0 %   Basophils Absolute 0.0 0.0 - 0.1 K/uL   Immature Granulocytes 1 %   Abs Immature Granulocytes 0.11 (H) 0.00 - 0.07 K/uL    Comment: Performed at [facility] [address] [place], [place] [address]  Comprehensive metabolic panel     Status: Abnormal   Collection Time: 06/01/20 12:35 AM  Result Value Ref Range   Sodium 142 135 - 145 mmol/L    Potassium 4.8 3.5 - 5.1 mmol/L   Chloride 112 (H) 98 - 111 mmol/L   CO2 18 (L) 22 - 32 mmol/L   Glucose, Bld 255 (H) 70 - 99 mg/dL    Comment: Glucose reference range applies only to samples taken after fasting for at least 8 hours.   BUN 33 (H) 8 - 23 mg/dL   Creatinine, Ser 1.65 (H) 0.44 - 1.00 mg/dL   Calcium 7.8 (L) 8.9 - 10.3 mg/dL   Total Protein 5.2 (L) 6.5 - 8.1 g/dL   Albumin 2.3 (L) 3.5 - 5.0 g/dL   AST 70 (H) 15 - 41 U/L   ALT 93 (H) 0 - 44 U/L   Alkaline Phosphatase 235 (H) 38 - 126 U/L   Total Bilirubin 3.0 (H) 0.3 - 1.2 mg/dL   GFR, Estimated 30 (L) >60 mL/min    Comment: (NOTE) Calculated using the CKD-EPI Creatinine Equation (2021)    Anion gap 12 5 - 15    Comment: Performed at [facility], [place] [address], [place], [place] [address]  Phosphorus     Status: None   Collection Time: 06/01/20 12:35 AM  Result Value Ref Range   Phosphorus 4.3 2.5 - 4.6 mg/dL    Comment: Performed at [facility] [address] [place], [place] [address]  Magnesium     Status: None   Collection Time: 06/01/20 12:35 AM  Result Value Ref Range   Magnesium 1.8 1.7 - 2.4 mg/dL    Comment: Performed at [facility] [address], [place],  [address]  Vitamin B12     Status: None   Collection Time: 06/01/20 12:35 AM  Result Value Ref Range   Vitamin B-12 588 180 - 914 pg/mL    Comment: (NOTE) This assay is not validated for testing neonatal or myeloproliferative syndrome specimens for Vitamin B12 levels. Performed at [facility],  [address], [place], [place] [address]   Folate     Status: None   Collection Time: 06/01/20 12:35 AM  Result Value Ref Range   Folate 21.4 >5.9 ng/mL    Comment: Performed at [facility] [address] [place], [place] [address]  Iron and TIBC     Status: Abnormal   Collection Time: 06/01/20 12:35 AM  Result Value Ref Range   Iron 50 28 - 170 ug/dL   TIBC 155 (L) 250 - 450 ug/dL   Saturation Ratios 32  (H) 10.4 - 31.8 %   UIBC 105 ug/dL    Comment: Performed at [facility], [place] [address], , [place] [address]  Ferritin     Status: Abnormal   Collection Time: 06/01/20 12:35 AM  Result Value Ref Range   Ferritin 393 (H) 11 - 307 ng/mL    Comment: Performed at [facility], [place] [address] , [place] [address]  Reticulocytes     Status: Abnormal   Collection Time: 06/01/20 12:35 AM  Result Value Ref Range   Retic Ct Pct 1.2 0.4 - 3.1 %   RBC. 3.06 (L) 3.87 - 5.11 MIL/uL   Retic Count, Absolute 35.5 19.0 - 186.0 K/uL   Immature Retic Fract 10.0 2.3 - 15.9 %    Comment: Performed at [facility] [address], [place], [place] [address]  Glucose, capillary     Status: Abnormal   Collection Time: 06/01/20 10:12 AM  Result Value Ref Range   Glucose-Capillary 165 (H) 70 - 99 mg/dL    Comment: Glucose reference range applies only to samples taken after fasting for at least 8 hours.  Surgical pathology     Status: None   Collection Time: 06/01/20  1:19 PM  Result Value Ref Range   SURGICAL PATHOLOGY      SURGICAL PATHOLOGY CASE: [id number] PATIENT: Jeannene Patella Surgical Pathology Report     Clinical History: Cholecystitis (cm)     FINAL MICROSCOPIC DIAGNOSIS:  A. GALLBLADDER, CHOLECYSTECTOMY: - Chronic cholecystitis with cholelithiasis.   GROSS DESCRIPTION:  Specimen: Gallbladder, received fresh. Size/?Intact: 6.7 x 3 x 2.2 cm, intact. Serosal surface: Yellow-pink, focally granular. Mucosa/Wall: Pink-green, smooth/Up to 0.6 cm thick. Contents: Multiple black friable choleliths ranging from minute fragments to 0.4 cm. Cystic duct: Patent. Block Summary: Representative sections in one block.  SW 06/01/2020    Final Diagnosis performed by Gillie Manners, MD.   Electronically signed 06/02/2020 Technical and / or Professional components performed at [facility]. [facility], [place] [address], [place], [place] [address].   Immunohistochemistry Technical component (if applicable) was performed at [facility]. [address], [place], [place], [place] [address].   IMMUNOHISTOCHEMISTRY DISCLAIMER (if applicable): Some of these immunohistochemical stains may have been developed and the performance characteristics determine by [facility]. Some may not have been cleared or approved by the U.S. Food and Drug Administration. The FDA has determined that such clearance or approval is not necessary. This test is used for clinical purposes. It should not be regarded as investigational or for research. This laboratory is certified under the [employer] (CLIA-88) as qualified to perform high complexity clinical laboratory testing.  The controls stained appropriately.   Glucose, capillary     Status: Abnormal   Collection Time: 06/01/20  2:06 PM  Result Value Ref Range   Glucose-Capillary 155 (H) 70 - 99 mg/dL  Comment: Glucose reference range applies only to samples taken after fasting for at least 8 hours.  Glucose, capillary     Status: Abnormal   Collection Time: 06/01/20  4:47 PM  Result Value Ref Range   Glucose-Capillary 167 (H) 70 - 99 mg/dL    Comment: Glucose reference range applies only to samples taken after fasting for at least 8 hours.  Glucose, capillary     Status: Abnormal   Collection Time: 06/01/20  9:01 PM  Result Value Ref Range   Glucose-Capillary 229 (H) 70 - 99 mg/dL    Comment: Glucose reference range applies only to samples taken after fasting for at least 8 hours.  CBC     Status: Abnormal   Collection Time: 06/02/20 12:22 AM  Result Value Ref Range   WBC 12.1 (H) 4.0 - 10.5 K/uL   RBC 3.32 (L) 3.87 - 5.11 MIL/uL   Hemoglobin 10.9 (L) 12.0 - 15.0 g/dL   HCT 32.1 (L) 36.0 - 46.0 %   MCV 96.7 80.0 - 100.0 fL   MCH 32.8 26.0 - 34.0 pg   MCHC 34.0 30.0 - 36.0 g/dL   RDW 12.9 11.5 - 15.5 %   Platelets 124 (L) 150 -  400 K/uL    Comment: REPEATED TO VERIFY   nRBC 0.0 0.0 - 0.2 %    Comment: Performed at [facility], [place] [address] [place], [place] [address]  Magnesium     Status: None   Collection Time: 06/02/20 12:22 AM  Result Value Ref Range   Magnesium 1.9 1.7 - 2.4 mg/dL    Comment: Performed at [facility], [place] [address] [place], [place] [address]  Comprehensive metabolic panel     Status: Abnormal   Collection Time: 06/02/20 12:22 AM  Result Value Ref Range   Sodium 140 135 - 145 mmol/L   Potassium 4.8 3.5 - 5.1 mmol/L   Chloride 111 98 - 111 mmol/L   CO2 18 (L) 22 - 32 mmol/L   Glucose, Bld 243 (H) 70 - 99 mg/dL    Comment: Glucose reference range applies only to samples taken after fasting for at least 8 hours.   BUN 37 (H) 8 - 23 mg/dL   Creatinine, Ser 1.64 (H) 0.44 - 1.00 mg/dL   Calcium 7.6 (L) 8.9 - 10.3 mg/dL   Total Protein 5.7 (L) 6.5 - 8.1 g/dL   Albumin 2.4 (L) 3.5 - 5.0 g/dL   AST 70 (H) 15 - 41 U/L   ALT 89 (H) 0 - 44 U/L   Alkaline Phosphatase 242 (H) 38 - 126 U/L   Total Bilirubin 1.4 (H) 0.3 - 1.2 mg/dL   GFR, Estimated 30 (L) >60 mL/min    Comment: (NOTE) Calculated using the CKD-EPI Creatinine Equation (2021)    Anion gap 11 5 - 15    Comment: Performed at [facility], [place] [address] [place], [place] [address]  Glucose, capillary     Status: Abnormal   Collection Time: 06/02/20  7:56 AM  Result Value Ref Range   Glucose-Capillary 173 (H) 70 - 99 mg/dL    Comment: Glucose reference range applies only to samples taken after fasting for at least 8 hours.  Glucose, capillary     Status: Abnormal   Collection Time: 06/02/20 12:04 PM  Result Value Ref Range   Glucose-Capillary 216 (H) 70 - 99 mg/dL    Comment: Glucose reference range applies only to samples taken after fasting  for at least 8 hours.  Glucose, capillary     Status: Abnormal   Collection Time: 06/02/20  1:08 PM  Result Value Ref Range   Glucose-Capillary 201 (H) 70 - 99 mg/dL     Comment: Glucose reference range applies only to samples taken after fasting for at least 8 hours.  TSH     Status: None   Collection Time: 06/02/20  2:30 PM  Result Value Ref Range   TSH 1.712 0.350 - 4.500 uIU/mL    Comment: Performed by a 3rd Generation assay with a functional sensitivity of <=0.01 uIU/mL. Performed at [facility], [place] [address], [place], [place] [address]   Vitamin B12     Status: None   Collection Time: 06/02/20  2:30 PM  Result Value Ref Range   Vitamin B-12 550 180 - 914 pg/mL    Comment: (NOTE) This assay is not validated for testing neonatal or myeloproliferative syndrome specimens for Vitamin B12 levels. Performed at [facility], [place] [address] [place], [place] [address]   Glucose, capillary     Status: Abnormal   Collection Time: 06/02/20  5:33 PM  Result Value Ref Range   Glucose-Capillary 180 (H) 70 - 99 mg/dL    Comment: Glucose reference range applies only to samples taken after fasting for at least 8 hours.  Glucose, capillary     Status: Abnormal   Collection Time: 06/02/20  9:35 PM  Result Value Ref Range   Glucose-Capillary 178 (H) 70 - 99 mg/dL    Comment: Glucose reference range applies only to samples taken after fasting for at least 8 hours.   No results found.  Assessment: 86 y.o. female with a history of seizures, on Keppra, currently post-op following laparascopic cholecystectomy, who experienced acute transient episodes of expressive aphasia today.  1. Exam reveals mild cognitive deficits related to orientation and concentration. Also had some difficulty comprehending instructions during cerebellar testing and could not name an uncommon item, but with naming intact otherwise. Speech is fluent.  2. Positive orthostatics at time of acute stroke evaluation 3. DDx for the event includes transient confusional state, subclinical seizure, TIA and AMS secondary to hypotensive episode 4. Stroke Risk Factors - DM2, HLD and  history of cancer 5. Not a candidate for tPA due to resolved symptoms.   Recommendations: 1. HgbA1c, fasting lipid panel 2. MRI, MRA of the brain without contrast 3. PT consult, OT consult, Speech consult 4. Echocardiogram 5. Carotid dopplers 6. Increase ASA to 325 mg po qd 7. Permissive HTN x 24 hours. [place] modified parameters given advanced age: Treat if SBP > 180 8. IVF 9. EEG 10. Telemetry monitoring 11. Frequent neuro checks   @Electronically  signed: Dr. Kerney Elbe   06/02/2020, 9:42 PM

## 2020-06-02 NOTE — Procedures (Signed)
Patient Name: ARION MORGAN  MRN: [id number]  Epilepsy Attending: Lora Havens  Referring Physician/Provider: Dr Carlisle Cater Date: 06/02/2020 Duration: 23.50 mins  Patient history:  86yo F with acute transient episodes of expressive aphasia today. EEG to evaluate for siezure  Level of alertness: Awake, asleep  AEDs during EEG study: LEV  Technical aspects: This EEG study was done with scalp electrodes positioned according to the 10-20 International system of electrode placement. Electrical activity was acquired at a sampling rate of 500Hz  and reviewed with a high frequency filter of 70Hz  and a low frequency filter of 1Hz . EEG data were recorded continuously and digitally stored.   Description: The posterior dominant rhythm consists of 7.5 Hz activity of moderate voltage (25-35 uV) seen predominantly in posterior head regions, symmetric and reactive to eye opening and eye closing. Sleep was characterized by vertex waves, sleep spindles (12 to 14 Hz), maximal frontocentral region.  EEG showed intermittent right>left temporal 2-3Hz  delta slowing.  Hyperventilation and photic stimulation were not performed.     ABNORMALITY -Intermittent slow, right>left temporal region  IMPRESSION: This study is suggestive of cortical dysfunction in right more than left temporal region, non specific etiology. No seizures or epileptiform discharges were seen throughout the recording.  Abdimalik Mayorquin Barbra Sarks

## 2020-06-02 NOTE — Evaluation (Signed)
Physical Therapy Evaluation Patient Details Name: Taylor Castaneda MRN: [id number] DOB: 12-28-1932 Today's Date: 06/02/2020   History of Present Illness  86 yo female with onset of witnessed seizure and syncope was brought to ED, had elevated BS, AKI found and had CBD stone.  Had cholelithiasis and cholecystectomy on 12/29, noted presurgical lactic acidosis and leukocytosis. PMHx:  cirrhosis, elevated liver enzymes, uncontrolled DM, HTN, CKD  Clinical Impression  Pt was seen for initial eval after a cholecystectomy on 12/29, and was able to walk carefully with help but also concerning for new symptoms since admission.  Pt was distracted during gait, but then had speech difficulties that resolved.  Asked daughter if this was normal, and learned that these episodes had begun recently.  MD was given the information and CT ordered to see how these symptoms might have started.  Follow up with her as tolerated and check for further orders which may evolve with symptoms as pt continues.  HHPT is planned unless pt condition warrants a change in dc location.    Follow Up Recommendations Home health PT;Supervision for mobility/OOB;Supervision/Assistance - 24 hour    Equipment Recommendations  None recommended by PT (has both cane and walker at home)    Recommendations for Other Services       Precautions / Restrictions Precautions Precautions: Fall Precaution Comments: monitor for vitals and neuro symptoms Restrictions Weight Bearing Restrictions: No      Mobility  Bed Mobility Overal bed mobility: Needs Assistance Bed Mobility: Supine to Sit;Sit to Supine     Supine to sit: Min assist Sit to supine: Min assist        Transfers Overall transfer level: Needs assistance Equipment used: Rolling walker (2 wheeled);1 person hand held assist Transfers: Sit to/from Stand Sit to Stand: Min assist         General transfer comment: minor help to correct hand placement and power  up  Ambulation/Gait Ambulation/Gait assistance: Min assist Gait Distance (Feet): 50 Feet Assistive device: Rolling walker (2 wheeled);1 person hand held assist Gait Pattern/deviations: Step-to pattern;Step-through pattern;Decreased stride length;Wide base of support Gait velocity: reduced Gait velocity interpretation: <1.8 ft/sec, indicate of risk for recurrent falls General Gait Details: pt lapsed into mild shuffling gait with speech changes and inattention to her surroundings, catching walker wheels on obstacles  Stairs            Wheelchair Mobility    Modified Rankin (Stroke Patients Only)       Balance Overall balance assessment: Needs assistance Sitting-balance support: Feet supported Sitting balance-Leahy Scale: Fair     Standing balance support: Bilateral upper extremity supported Standing balance-Leahy Scale: Poor                               Pertinent Vitals/Pain Pain Assessment: Faces Faces Pain Scale: Hurts a little bit Pain Location: abdomen    Home Living Family/patient expects to be discharged to:: Skilled nursing facility Living Arrangements: Alone Available Help at Discharge: Family;Available PRN/intermittently;Available 24 hours/day (continual help initially) Type of Home: House Home Access: Stairs to enter Entrance Stairs-Rails: Can reach both Entrance Stairs-Number of Steps: 2 Home Layout: One level Home Equipment: Walker - 2 wheels;Cane - single point;Bedside commode;Grab bars - toilet;Grab bars - tub/shower;Shower seat Additional Comments: has a great deal of equipment from caring for her husband    Prior Function Level of Independence: Independent;Independent with assistive device(s)  Comments: using AD at times     Hand Dominance   Dominant Hand: Right    Extremity/Trunk Assessment   Upper Extremity Assessment Upper Extremity Assessment: Overall WFL for tasks assessed    Lower Extremity  Assessment Lower Extremity Assessment: Generalized weakness    Cervical / Trunk Assessment Cervical / Trunk Assessment: Kyphotic  Communication   Communication: Expressive difficulties (had moments of slower speech with word finding difficulties)  Cognition Arousal/Alertness: Awake/alert Behavior During Therapy: WFL for tasks assessed/performed Overall Cognitive Status: Impaired/Different from baseline Area of Impairment: Awareness (had difficulties in the moment with expressing herself)                           Awareness: Intellectual   General Comments: pt is gnerally not demonstrating focal weakness but has speech changes that cleared up with word finding and ability to retrieve ST memory information      General Comments General comments (skin integrity, edema, etc.): pt had concerning moments of inattention to clearing doorways and word findign which was reported to MD    Exercises     Assessment/Plan    PT Assessment    PT Problem List         PT Treatment Interventions      PT Goals (Current goals can be found in the Care Plan section)  Acute Rehab PT Goals Patient Stated Goal: to go home with her children PT Goal Formulation: With patient/family Time For Goal Achievement: 06/16/20 Potential to Achieve Goals: Good    Frequency     Barriers to discharge        Co-evaluation               AM-PAC PT "6 Clicks" Mobility  Outcome Measure Help needed turning from your back to your side while in a flat bed without using bedrails?: A Little Help needed moving from lying on your back to sitting on the side of a flat bed without using bedrails?: A Little Help needed moving to and from a bed to a chair (including a wheelchair)?: A Little Help needed standing up from a chair using your arms (e.g., wheelchair or bedside chair)?: A Little Help needed to walk in hospital room?: A Little Help needed climbing 3-5 steps with a railing? : A Lot 6 Click  Score: 17    End of Session Equipment Utilized During Treatment: Gait belt Activity Tolerance: Treatment limited secondary to medical complications (Comment) Patient left: in bed;with call bell/phone within reach;with bed alarm set;with family/visitor present Nurse Communication: Mobility status;Other (comment) (concerning verbal symptoms) PT Visit Diagnosis: Unsteadiness on feet (R26.81);Muscle weakness (generalized) (M62.81);Other symptoms and signs involving the nervous system (R29.898)    Time: 7858-8502 PT Time Calculation (min) (ACUTE ONLY): 24 min   Charges:   PT Evaluation $PT Eval Moderate Complexity: 1 Mod PT Treatments $Gait Training: 8-22 mins       Ramond Dial 06/02/2020, 11:31 PM  Mee Hives, PT MS Acute Rehab Dept. Number: [place] and [place]

## 2020-06-02 NOTE — Progress Notes (Signed)
Taylor Castaneda Team evaluated patient and decided she needs Q2 neuro checks, transfer orders needed for 3[facility].  Dr. Alfredia Ferguson made aware.

## 2020-06-02 NOTE — TOC Initial Note (Signed)
Transition of Care [facility]) - Initial/Assessment Note    Patient Details  Name: Taylor Castaneda MRN: [id number] Date of Birth: [birth year]  Transition of Care [facility]) CM/SW Contact:    Marilu Favre, RN Phone Number: 06/02/2020, 12:34 PM  Clinical Narrative:                  Spoke to patient and daughter at bedside. Confirmed face sheet information. Daughter and her brothers are going to take turns staying with their mother at her address. Patient already has DME.  Patient and daughter reviewing [email or web address] home health list will follow up  Expected Discharge Plan: [place]     Patient Goals and CMS Choice Patient states their goals for this hospitalization and ongoing recovery are:: to return to home CMS Medicare.gov Compare Post Acute Care list provided to:: Patient Choice offered to / list presented to : [place]  Expected Discharge Plan and Services Expected Discharge Plan: [place]   Discharge Planning Services: CM Consult Post Acute Care Choice: Durable Medical Equipment Living arrangements for the past 2 months: Apartment                 DME Arranged: N/A                    Prior Living Arrangements/Services Living arrangements for the past 2 months: Apartment Lives with:: Self Patient language and need for interpreter reviewed:: Yes        Need for Family Participation in Patient Care: Yes (Comment) Care giver support system in place?: Yes (comment) Current home services: DME Criminal Activity/Legal Involvement Pertinent to Current Situation/Hospitalization: No - Comment as needed  Activities of Daily Living Home Assistive Devices/Equipment: Gilford Rile (specify type) ADL Screening (condition at time of admission) Patient's cognitive ability adequate to safely complete daily activities?: Yes Is the patient deaf or have difficulty hearing?: No Does the patient have difficulty seeing, even when wearing  glasses/contacts?: No Does the patient have difficulty concentrating, remembering, or making decisions?: No Patient able to express need for assistance with ADLs?: Yes Does the patient have difficulty dressing or bathing?: No Independently performs ADLs?: No Communication: Independent Dressing (OT): Independent Grooming: Independent Feeding: Independent Bathing: Independent Toileting: Independent In/Out Bed: Independent Walks in Home: Independent Does the patient have difficulty walking or climbing stairs?: No Weakness of Legs: Both Weakness of Arms/Hands: None  Permission Sought/Granted   Permission granted to share information with : Yes, Verbal Permission Granted  Share Information with NAME: daughter           Emotional Assessment Appearance:: Appears stated age Attitude/Demeanor/Rapport: Engaged Affect (typically observed): Accepting Orientation: : Oriented to Self,Oriented to Place,Oriented to  Time,Oriented to Situation Alcohol / Substance Use: Not Applicable Psych Involvement: No (comment)  Admission diagnosis:  Cholelithiasis [K80.20] Patient Active Problem List   Diagnosis Date Noted  . Common bile duct (CBD) obstruction   . Choledocholithiasis 05/30/2020  . Cholelithiasis 05/30/2020  . Hyperbilirubinemia 05/30/2020  . Normocytic anemia 05/30/2020  . Transaminitis 05/30/2020  . Pancreatic duct dilated 05/30/2020  . Dry age-related macular degeneration 05/06/2020  . Secondary hyperparathyroidism of renal origin ([place]) 02/20/2020  . Anhedonia 02/20/2020  . Seizure ([place]) 03/18/2019  . Transient atrial fibrillation ([place]) 03/18/2019  . MCI (mild cognitive impairment) 03/18/2019  . History of gout 04/29/2017  . OA (osteoarthritis) of knee 12/17/2016  . Dry skin dermatitis 06/13/2016  . Health maintenance examination 01/30/2016  . Medicare annual wellness  visit, subsequent 03/10/2014  . Advanced care planning/counseling discussion 03/10/2014  . PAD (peripheral  artery disease) ([place]) 11/11/2013  . Dyspnea 09/18/2011  . PMR (polymyalgia rheumatica) ([place]) 08/27/2011  . Controlled type 2 diabetes mellitus with diabetic nephropathy, with long-term current use of insulin ([place]) 08/04/2009  . Essential hypertension 06/13/2009  . CKD (chronic kidney disease), stage III ([place]) 01/24/2009  . Primary angle-closure glaucoma 11/13/2007  . Osteopenia 11/13/2007  . History of colon cancer 11/13/2007  . Hyperlipidemia associated with type 2 diabetes mellitus ([place]) 10/23/2006   PCP:  [name], MD Pharmacy:   [facility] [address] [place] [address] [place] [address] Phone: [phone] Fax: [phone]     Social Determinants of Health (SDOH) Interventions    Readmission Risk Interventions No flowsheet data found.

## 2020-06-02 NOTE — Progress Notes (Signed)
EEG complete - results pending 

## 2020-06-02 NOTE — Progress Notes (Signed)
OT Cancellation Note  Patient Details Name: Taylor Castaneda MRN: [id number] DOB: March 24, 1933   Cancelled Treatment:    Reason Eval/Treat Not Completed: Medical issues which prohibited therapy;Other (comment). Noted pt with word finding difficulties this AM during PT eval. Pt now with new code stroke orders in place, as well as stat CT. Will follow-up for OT eval as appropriate.   Layla Maw 06/02/2020, 1:01 PM

## 2020-06-02 NOTE — Progress Notes (Signed)
Inpatient Diabetes Program Recommendations  AACE/ADA: New Consensus Statement on Inpatient Glycemic Control (2015)  Target Ranges:  Prepandial:   less than 140 mg/dL      Peak postprandial:   less than 180 mg/dL (1-2 hours)      Critically ill patients:  140 - 180 mg/dL   Lab Results  Component Value Date   GLUCAP 201 (H) 06/02/2020   HGBA1C 8.7 (H) 02/12/2020    Review of Glycemic Control Results for IWALANI, [place] (MRN [id number]) as of 06/02/2020 14:05  Ref. Range 06/01/2020 10:12 06/01/2020 14:06 06/01/2020 16:47 06/01/2020 21:01 06/02/2020 07:56 06/02/2020 12:04 06/02/2020 13:08  Glucose-Capillary Latest Ref Range: 70 - 99 mg/dL 165 (H) 155 (H) 167 (H) 229 (H) 173 (H) 216 (H) 201 (H)   Diabetes history:  DM2 Outpatient Diabetes medications:  Lantus 16 units QHS Humalog 0-10 units TID Current orders for Inpatient glycemic control:  Lantus 8 units QHS  Inpatient Diabetes Program Recommendations:     Novolog 0-9 units TID  Will continue to follow while inpatient.  Thank you, Reche Dixon, RN, BSN Diabetes Coordinator Inpatient Diabetes Program [phone] (team pager from 8a-5p)

## 2020-06-02 NOTE — Code Documentation (Signed)
Stroke Response Nurse Documentation Code Documentation  Jurnie Garritano Mofield is a 86 y.o. female admitted to [facility] on 05/30/2020 with past medical hx of fall, seizures, and gallstones. Patient had an unwitness fall with a seizure to follow on Sunday 12/26 per family. She was taken to [facility] where she was evaluated. During evaluation, they noted gallstones and transferred patient to [facility] for [facility].   This morning after giving patient meds, pt was noted to have some trouble getting her words out. Lasted five minutes and resolved. At 1015, pt was at her baseline. At 1220, Physical Therapy arrived to assess patient and noted trouble speaking.   Code stroke was activated by Dr. Jettie Pagan. Stroke team at the bedside post activation. NIHSS 0, see documentation for details and code stroke times. No deficits noted during the assessment. Per MD Cheral Marker, patient to have MRI and no need to get CT at this time. Orthostatics completed and noted to be positive. Patient is not a candidate for tPA due to resolved symptoms. Care/Plan: q2 mNIHSS/VS. Transfer to [facility]. MRI. Bedside handoff with RN Paulette.    Kathrin Greathouse  Stroke Response RN

## 2020-06-02 NOTE — Progress Notes (Signed)
Patient ID: Taylor Castaneda, female   DOB: 16-Jan-1933, 86 y.o.   MRN: [id number] [facility] Surgery Progress Note:   1 Day Post-Op  Subjective: Mental status is clear; .  Complaints none. Objective: Vital signs in last 24 hours: Temp:  [97 F (36.1 C)-97.6 F (36.4 C)] 97.5 F (36.4 C) (12/30 0534) Pulse Rate:  [55-63] 61 (12/30 0534) Resp:  [14-17] 16 (12/30 0534) BP: (146-175)/(61-102) 175/86 (12/30 0534) SpO2:  [96 %-100 %] 100 % (12/30 0534) Weight:  [50.8 kg] 50.8 kg (12/29 1045)  Intake/Output from previous day: 12/29 0701 - 12/30 0700 In: 2190.7 [P.O.:120; I.V.:2070.7] Out: 770 [Urine:770] Intake/Output this shift: No intake/output data recorded.  Physical Exam: Work of breathing is normal.  Incisions are bland.    Lab Results:  Results for orders placed or performed during the hospital encounter of 05/30/20 (from the past 48 hour(s))  Surgical pathology     Status: None   Collection Time: 05/31/20 11:40 AM  Result Value Ref Range   SURGICAL PATHOLOGY      SURGICAL PATHOLOGY CASE: [id number] PATIENT: Taylor Castaneda Surgical Pathology Report     Clinical History: CBD stone, abnormal appearing pancreatic duct, several hours of right upper quadrant radiating to back pain, elevated LFTs, R/O H. Pylori (cm)     FINAL MICROSCOPIC DIAGNOSIS:  A. STOMACH, BIOPSY: - Mild chronic gastritis.  Mild reactive gastropathy. - Warthin-Starry stain is negative for Helicobacter pylori.   GROSS DESCRIPTION:  Received in formalin are tan-pink soft tissue fragments that are submitted in toto. Number: 2 Size: 0.1-0.25 cm Blocks: 1  SW 05/31/2020    Final Diagnosis performed by Gillie Manners, MD.   Electronically signed 06/01/2020 Technical and / or Professional components performed at [facility]. [facility], [place] [address], [place], [place] [address].  Immunohistochemistry Technical component (if applicable) was performed at [facility]. [address] [place], [place], [place] [address].    IMMUNOHISTOCHEMISTRY DISCLAIMER (if applicable): Some of these immunohistochemical stains may have been developed and the performance characteristics determine by [facility]. Some may not have been cleared or approved by the U.S. Food and Drug Administration. The FDA has determined that such clearance or approval is not necessary. This test is used for clinical purposes. It should not be regarded as investigational or for research. This laboratory is certified under the [place] (CLIA-88) as qualified to perform high complexity clinical laboratory testing.  The controls stained appropriately.   Glucose, capillary     Status: Abnormal   Collection Time: 05/31/20  1:16 PM  Result Value Ref Range   Glucose-Capillary 152 (H) 70 - 99 mg/dL    Comment: Glucose reference range applies only to samples taken after fasting for at least 8 hours.  Glucose, capillary     Status: Abnormal   Collection Time: 05/31/20  4:24 PM  Result Value Ref Range   Glucose-Capillary 150 (H) 70 - 99 mg/dL    Comment: Glucose reference range applies only to samples taken after fasting for at least 8 hours.  Surgical pcr screen     Status: None   Collection Time: 05/31/20  8:24 PM   Specimen: Nasal Mucosa; Nasal Swab  Result Value Ref Range   MRSA, PCR NEGATIVE NEGATIVE   Staphylococcus aureus NEGATIVE NEGATIVE    Comment: (NOTE) The Xpert SA Assay (FDA approved for NASAL specimens in patients 58 years of age and older), is one component of a comprehensive surveillance  program. It is not intended to diagnose infection nor to guide or monitor treatment. Performed at [facility], [place] [address] [place], [place] [address]   Glucose, capillary     Status: Abnormal   Collection Time: 05/31/20  9:22 PM  Result Value Ref Range   Glucose-Capillary 196 (H) 70 - 99 mg/dL    Comment:  Glucose reference range applies only to samples taken after fasting for at least 8 hours.  CBC with Differential/Platelet     Status: Abnormal   Collection Time: 06/01/20 12:35 AM  Result Value Ref Range   WBC 13.2 (H) 4.0 - 10.5 K/uL   RBC 3.03 (L) 3.87 - 5.11 MIL/uL   Hemoglobin 10.0 (L) 12.0 - 15.0 g/dL   HCT 29.8 (L) 36.0 - 46.0 %   MCV 98.3 80.0 - 100.0 fL   MCH 33.0 26.0 - 34.0 pg   MCHC 33.6 30.0 - 36.0 g/dL   RDW 12.7 11.5 - 15.5 %   Platelets 113 (L) 150 - 400 K/uL    Comment: REPEATED TO VERIFY PLATELET COUNT CONFIRMED BY SMEAR Immature Platelet Fraction may be clinically indicated, consider ordering this additional test [id number]    nRBC 0.0 0.0 - 0.2 %   Neutrophils Relative % 88 %   Neutro Abs 11.6 (H) 1.7 - 7.7 K/uL   Lymphocytes Relative 6 %   Lymphs Abs 0.8 0.7 - 4.0 K/uL   Monocytes Relative 5 %   Monocytes Absolute 0.6 0.1 - 1.0 K/uL   Eosinophils Relative 0 %   Eosinophils Absolute 0.0 0.0 - 0.5 K/uL   Basophils Relative 0 %   Basophils Absolute 0.0 0.0 - 0.1 K/uL   Immature Granulocytes 1 %   Abs Immature Granulocytes 0.11 (H) 0.00 - 0.07 K/uL    Comment: Performed at [place] [address], [place], [place] [address]  Comprehensive metabolic panel     Status: Abnormal   Collection Time: 06/01/20 12:35 AM  Result Value Ref Range   Sodium 142 135 - 145 mmol/L   Potassium 4.8 3.5 - 5.1 mmol/L   Chloride 112 (H) 98 - 111 mmol/L   CO2 18 (L) 22 - 32 mmol/L   Glucose, Bld 255 (H) 70 - 99 mg/dL    Comment: Glucose reference range applies only to samples taken after fasting for at least 8 hours.   BUN 33 (H) 8 - 23 mg/dL   Creatinine, Ser 1.65 (H) 0.44 - 1.00 mg/dL   Calcium 7.8 (L) 8.9 - 10.3 mg/dL   Total Protein 5.2 (L) 6.5 - 8.1 g/dL   Albumin 2.3 (L) 3.5 - 5.0 g/dL   AST 70 (H) 15 - 41 U/L   ALT 93 (H) 0 - 44 U/L   Alkaline Phosphatase 235 (H) 38 - 126 U/L   Total Bilirubin 3.0 (H) 0.3 - 1.2 mg/dL   GFR, Estimated 30 (L) >60 mL/min     Comment: (NOTE) Calculated using the CKD-EPI Creatinine Equation (2021)    Anion gap 12 5 - 15    Comment: Performed at [facility], [place] [address] [place], [place] [address]  Phosphorus     Status: None   Collection Time: 06/01/20 12:35 AM  Result Value Ref Range   Phosphorus 4.3 2.5 - 4.6 mg/dL    Comment: Performed at [place] [address], [place], [place] [address]  Magnesium     Status: None   Collection Time: 06/01/20 12:35 AM  Result Value Ref Range   Magnesium 1.8 1.7 - 2.4 mg/dL    Comment: Performed at [place] [address], [place], [place] [address]  Vitamin B12     Status: None   Collection Time: 06/01/20 12:35 AM  Result Value Ref Range   Vitamin B-12 588 180 - 914 pg/mL    Comment: (NOTE) This assay is not validated for testing neonatal or myeloproliferative syndrome specimens for Vitamin B12 levels. Performed at [facility], [place] [address], [place], [place] [address]   Folate     Status: None   Collection Time: 06/01/20 12:35 AM  Result Value Ref Range   Folate 21.4 >5.9 ng/mL    Comment: Performed at [place] [address], [place], [place] [address]  Iron and TIBC     Status: Abnormal   Collection Time: 06/01/20 12:35 AM  Result Value Ref Range   Iron 50 28 - 170 ug/dL   TIBC 155 (L) 250 - 450 ug/dL   Saturation Ratios 32 (H) 10.4 - 31.8 %   UIBC 105 ug/dL    Comment: Performed at [facility], [place] [address] [place], [place] [address]  Ferritin     Status: Abnormal   Collection Time: 06/01/20 12:35 AM  Result Value Ref Range   Ferritin 393 (H) 11 - 307 ng/mL    Comment: Performed at [facility], [place] [address], [place], [place] [address]  Reticulocytes     Status: Abnormal   Collection Time: 06/01/20 12:35 AM  Result Value Ref Range   Retic Ct Pct 1.2 0.4 - 3.1 %   RBC. 3.06 (L) 3.87 - 5.11 MIL/uL   Retic Count, Absolute 35.5 19.0 - 186.0 K/uL   Immature Retic Fract 10.0 2.3 - 15.9  %    Comment: Performed at [place] [address] [place], [place] [address]  Glucose, capillary     Status: Abnormal   Collection Time: 06/01/20 10:12 AM  Result Value Ref Range   Glucose-Capillary 165 (H) 70 - 99 mg/dL    Comment: Glucose reference range applies only to samples taken after fasting for at least 8 hours.  Glucose, capillary     Status: Abnormal   Collection Time: 06/01/20  2:06 PM  Result Value Ref Range   Glucose-Capillary 155 (H) 70 - 99 mg/dL    Comment: Glucose reference range applies only to samples taken after fasting for at least 8 hours.  Glucose, capillary     Status: Abnormal   Collection Time: 06/01/20  4:47 PM  Result Value Ref Range   Glucose-Capillary 167 (H) 70 - 99 mg/dL    Comment: Glucose reference range applies only to samples taken after fasting for at least 8 hours.  Glucose, capillary     Status: Abnormal   Collection Time: 06/01/20  9:01 PM  Result Value Ref Range   Glucose-Capillary 229 (H) 70 - 99 mg/dL    Comment: Glucose reference range applies only to samples taken after fasting for at least 8 hours.  CBC     Status: Abnormal   Collection Time: 06/02/20 12:22 AM  Result Value Ref Range   WBC 12.1 (H) 4.0 - 10.5 K/uL   RBC 3.32 (L) 3.87 - 5.11 MIL/uL   Hemoglobin 10.9 (L) 12.0 - 15.0 g/dL   HCT 32.1 (L) 36.0 - 46.0 %   MCV 96.7 80.0 - 100.0 fL   MCH 32.8 26.0 -  34.0 pg   MCHC 34.0 30.0 - 36.0 g/dL   RDW 12.9 11.5 - 15.5 %   Platelets 124 (L) 150 - 400 K/uL    Comment: REPEATED TO VERIFY   nRBC 0.0 0.0 - 0.2 %    Comment: Performed at [facility], [place] [address], [place], [place] [address]  Magnesium     Status: None   Collection Time: 06/02/20 12:22 AM  Result Value Ref Range   Magnesium 1.9 1.7 - 2.4 mg/dL    Comment: Performed at [facility], [place] [address], [place], [place] [address]  Comprehensive metabolic panel     Status: Abnormal   Collection Time: 06/02/20 12:22 AM  Result Value Ref Range    Sodium 140 135 - 145 mmol/L   Potassium 4.8 3.5 - 5.1 mmol/L   Chloride 111 98 - 111 mmol/L   CO2 18 (L) 22 - 32 mmol/L   Glucose, Bld 243 (H) 70 - 99 mg/dL    Comment: Glucose reference range applies only to samples taken after fasting for at least 8 hours.   BUN 37 (H) 8 - 23 mg/dL   Creatinine, Ser 1.64 (H) 0.44 - 1.00 mg/dL   Calcium 7.6 (L) 8.9 - 10.3 mg/dL   Total Protein 5.7 (L) 6.5 - 8.1 g/dL   Albumin 2.4 (L) 3.5 - 5.0 g/dL   AST 70 (H) 15 - 41 U/L   ALT 89 (H) 0 - 44 U/L   Alkaline Phosphatase 242 (H) 38 - 126 U/L   Total Bilirubin 1.4 (H) 0.3 - 1.2 mg/dL   GFR, Estimated 30 (L) >60 mL/min    Comment: (NOTE) Calculated using the CKD-EPI Creatinine Equation (2021)    Anion gap 11 5 - 15    Comment: Performed at [facility], [place] [address], [place], [place] [address]  Glucose, capillary     Status: Abnormal   Collection Time: 06/02/20  7:56 AM  Result Value Ref Range   Glucose-Capillary 173 (H) 70 - 99 mg/dL    Comment: Glucose reference range applies only to samples taken after fasting for at least 8 hours.    Radiology/Results: DG ERCP BILIARY & PANCREATIC DUCTS  Result Date: 05/31/2020 CLINICAL DATA:  ERCP. EXAM: ERCP TECHNIQUE: Multiple spot images obtained with the fluoroscopic device and submitted for interpretation post-procedure. COMPARISON:  MRCP-05/30/2020 FLUOROSCOPY TIME:  2 minutes, 33 seconds (22.1 mGy) FINDINGS: Twelve spot intraoperative fluoroscopic images of the right upper abdominal quadrant during ERCP are provided for review Initial image demonstrates an ERCP probe overlying the right upper abdominal quadrant Subsequent images demonstrate selective cannulation and opacification of the CBD which appears moderately dilated. There is a lenticular for filling defect within the distal aspect of the CBD likely compatible with questioned choledocholithiasis on preceding MRCP. Subsequent images demonstrate plasty at the level of the biliary ampulla with  subsequent biliary sweeping and sphincterotomy. There is minimal opacification the cystic duct and gallbladder lumen. There is minimal opacification of the intrahepatic biliary tree which appears nondilated. There is no definitive opacification of the pancreatic duct. IMPRESSION: ERCP with findings of choledocholithiasis, biliary plasty, sweeping and presumed sphincterotomy. These images were submitted for radiologic interpretation only. Please see the procedural report for the amount of contrast and the fluoroscopy time utilized. Electronically Signed   By: Sandi Mariscal M.D.   On: 05/31/2020 13:09    Anti-infectives: Anti-infectives (From admission, onward)   Start     Dose/Rate Route Frequency Ordered Stop  05/30/20 1615  cefTRIAXone (ROCEPHIN) 2 g in sodium chloride 0.9 % 100 mL IVPB        2 g 200 mL/hr over 30 Minutes Intravenous Every 24 hours 05/30/20 1516     05/30/20 1615  metroNIDAZOLE (FLAGYL) tablet 500 mg  Status:  Discontinued        500 mg Oral Every 8 hours 05/30/20 1516 05/31/20 1611      Assessment/Plan: Problem List: Patient Active Problem List   Diagnosis Date Noted  . Common bile duct (CBD) obstruction   . Choledocholithiasis 05/30/2020  . Cholelithiasis 05/30/2020  . Hyperbilirubinemia 05/30/2020  . Normocytic anemia 05/30/2020  . Transaminitis 05/30/2020  . Pancreatic duct dilated 05/30/2020  . Dry age-related macular degeneration 05/06/2020  . Secondary hyperparathyroidism of renal origin ([place]) 02/20/2020  . Anhedonia 02/20/2020  . Seizure ([place]) 03/18/2019  . Transient atrial fibrillation ([place]) 03/18/2019  . MCI (mild cognitive impairment) 03/18/2019  . History of gout 04/29/2017  . OA (osteoarthritis) of knee 12/17/2016  . Dry skin dermatitis 06/13/2016  . Health maintenance examination 01/30/2016  . Medicare annual wellness visit, subsequent 03/10/2014  . Advanced care planning/counseling discussion 03/10/2014  . PAD (peripheral artery disease) ([place])  11/11/2013  . Dyspnea 09/18/2011  . PMR (polymyalgia rheumatica) ([place]) 08/27/2011  . Controlled type 2 diabetes mellitus with diabetic nephropathy, with long-term current use of insulin ([place]) 08/04/2009  . Essential hypertension 06/13/2009  . CKD (chronic kidney disease), stage III ([place]) 01/24/2009  . Primary angle-closure glaucoma 11/13/2007  . Osteopenia 11/13/2007  . History of colon cancer 11/13/2007  . Hyperlipidemia associated with type 2 diabetes mellitus ([place]) 10/23/2006    Doing well post ERCP and lap chole.  OK for discharge today.  Followup in [place] office with Dr. Kae Heller.   1 Day Post-Op    LOS: 3 days   Matt B. Hassell Done, MD, [facility] [phone] to reach the surgeon on call.    06/02/2020 8:37 AM

## 2020-06-03 ENCOUNTER — Inpatient Hospital Stay (HOSPITAL_COMMUNITY): Payer: Medicare Other

## 2020-06-03 DIAGNOSIS — I679 Cerebrovascular disease, unspecified: Secondary | ICD-10-CM | POA: Diagnosis not present

## 2020-06-03 DIAGNOSIS — I6389 Other cerebral infarction: Secondary | ICD-10-CM

## 2020-06-03 DIAGNOSIS — N1831 Chronic kidney disease, stage 3a: Secondary | ICD-10-CM | POA: Diagnosis not present

## 2020-06-03 DIAGNOSIS — I6330 Cerebral infarction due to thrombosis of unspecified cerebral artery: Secondary | ICD-10-CM | POA: Insufficient documentation

## 2020-06-03 DIAGNOSIS — R569 Unspecified convulsions: Secondary | ICD-10-CM | POA: Diagnosis not present

## 2020-06-03 DIAGNOSIS — I951 Orthostatic hypotension: Secondary | ICD-10-CM

## 2020-06-03 DIAGNOSIS — K831 Obstruction of bile duct: Secondary | ICD-10-CM | POA: Diagnosis not present

## 2020-06-03 DIAGNOSIS — K8020 Calculus of gallbladder without cholecystitis without obstruction: Secondary | ICD-10-CM | POA: Diagnosis not present

## 2020-06-03 DIAGNOSIS — R7401 Elevation of levels of liver transaminase levels: Secondary | ICD-10-CM | POA: Diagnosis not present

## 2020-06-03 LAB — CBC WITH DIFFERENTIAL/PLATELET
Abs Immature Granulocytes: 0.24 10*3/uL — ABNORMAL HIGH (ref 0.00–0.07)
Basophils Absolute: 0 10*3/uL (ref 0.0–0.1)
Basophils Relative: 0 %
Eosinophils Absolute: 0.1 10*3/uL (ref 0.0–0.5)
Eosinophils Relative: 1 %
HCT: 29.1 % — ABNORMAL LOW (ref 36.0–46.0)
Hemoglobin: 9.4 g/dL — ABNORMAL LOW (ref 12.0–15.0)
Immature Granulocytes: 2 %
Lymphocytes Relative: 20 %
Lymphs Abs: 2.4 10*3/uL (ref 0.7–4.0)
MCH: 32.2 pg (ref 26.0–34.0)
MCHC: 32.3 g/dL (ref 30.0–36.0)
MCV: 99.7 fL (ref 80.0–100.0)
Monocytes Absolute: 0.8 10*3/uL (ref 0.1–1.0)
Monocytes Relative: 7 %
Neutro Abs: 8.4 10*3/uL — ABNORMAL HIGH (ref 1.7–7.7)
Neutrophils Relative %: 70 %
Platelets: 119 10*3/uL — ABNORMAL LOW (ref 150–400)
RBC: 2.92 MIL/uL — ABNORMAL LOW (ref 3.87–5.11)
RDW: 13.1 % (ref 11.5–15.5)
WBC: 12 10*3/uL — ABNORMAL HIGH (ref 4.0–10.5)
nRBC: 0 % (ref 0.0–0.2)

## 2020-06-03 LAB — ECHOCARDIOGRAM COMPLETE
Area-P 1/2: 4.15 cm2
Calc EF: 64.6 %
Height: 59 in
S' Lateral: 2 cm
Single Plane A2C EF: 55 %
Single Plane A4C EF: 74.2 %
Weight: 1792 oz

## 2020-06-03 LAB — COMPREHENSIVE METABOLIC PANEL
ALT: 69 U/L — ABNORMAL HIGH (ref 0–44)
AST: 53 U/L — ABNORMAL HIGH (ref 15–41)
Albumin: 2.3 g/dL — ABNORMAL LOW (ref 3.5–5.0)
Alkaline Phosphatase: 192 U/L — ABNORMAL HIGH (ref 38–126)
Anion gap: 9 (ref 5–15)
BUN: 36 mg/dL — ABNORMAL HIGH (ref 8–23)
CO2: 19 mmol/L — ABNORMAL LOW (ref 22–32)
Calcium: 7.9 mg/dL — ABNORMAL LOW (ref 8.9–10.3)
Chloride: 114 mmol/L — ABNORMAL HIGH (ref 98–111)
Creatinine, Ser: 1.67 mg/dL — ABNORMAL HIGH (ref 0.44–1.00)
GFR, Estimated: 30 mL/min — ABNORMAL LOW (ref >60)
Glucose, Bld: 127 mg/dL — ABNORMAL HIGH (ref 70–99)
Potassium: 4.2 mmol/L (ref 3.5–5.1)
Sodium: 142 mmol/L (ref 135–145)
Total Bilirubin: 0.9 mg/dL (ref 0.3–1.2)
Total Protein: 4.9 g/dL — ABNORMAL LOW (ref 6.5–8.1)

## 2020-06-03 LAB — GLUCOSE, CAPILLARY
Glucose-Capillary: 90 mg/dL (ref 70–99)
Glucose-Capillary: 96 mg/dL (ref 70–99)
Glucose-Capillary: 85 mg/dL (ref 70–99)
Glucose-Capillary: 109 mg/dL — ABNORMAL HIGH (ref 70–99)

## 2020-06-03 LAB — PHOSPHORUS: Phosphorus: 2.6 mg/dL (ref 2.5–4.6)

## 2020-06-03 LAB — RPR: RPR Ser Ql: NONREACTIVE

## 2020-06-03 LAB — MAGNESIUM: Magnesium: 1.9 mg/dL (ref 1.7–2.4)

## 2020-06-03 MED ORDER — CLOPIDOGREL BISULFATE 75 MG PO TABS
75.0000 mg | ORAL_TABLET | Freq: Every day | ORAL | Status: DC
  Administered 2020-06-03 – 2020-06-10 (×8): 75 mg via ORAL
  Filled 2020-06-03 (×8): qty 1

## 2020-06-03 MED ORDER — ASPIRIN EC 325 MG PO TBEC
325.0000 mg | DELAYED_RELEASE_TABLET | Freq: Every day | ORAL | Status: DC
  Administered 2020-06-03 – 2020-06-10 (×8): 325 mg via ORAL
  Filled 2020-06-03 (×8): qty 1

## 2020-06-03 MED ORDER — LEVETIRACETAM 500 MG PO TABS
500.0000 mg | ORAL_TABLET | Freq: Two times a day (BID) | ORAL | Status: DC
  Administered 2020-06-03 – 2020-06-10 (×15): 500 mg via ORAL
  Filled 2020-06-03 (×15): qty 1

## 2020-06-03 NOTE — Anesthesia Postprocedure Evaluation (Signed)
Anesthesia Post Note  Patient: Taylor Castaneda  Procedure(s) Performed: LAPAROSCOPIC CHOLECYSTECTOMY (N/A Abdomen)     Anesthesia Type: General Anesthetic complications: no   No complications documented.  Last Vitals:  Vitals:   06/03/20 1725 06/03/20 1726  BP: (!) 153/84 (!) 154/52  Pulse: 64 70  Resp:    Temp:    SpO2:      Last Pain:  Vitals:   06/03/20 1620  TempSrc: Oral  PainSc:    Pain Goal: Patients Stated Pain Goal: 0 (06/01/20 1541)                 Tiajuana Amass

## 2020-06-03 NOTE — Evaluation (Signed)
Speech Language Pathology Evaluation Patient Details Name: Taylor Castaneda MRN: [id number] DOB: 1933-04-15 Today's Date: 06/03/2020 Time: 6195-0932 SLP Time Calculation (min) (ACUTE ONLY): 24 min  Problem List:  Patient Active Problem List   Diagnosis Date Noted  . Cerebral thrombosis with cerebral infarction 06/03/2020  . Common bile duct (CBD) obstruction   . Choledocholithiasis 05/30/2020  . Cholelithiasis 05/30/2020  . Hyperbilirubinemia 05/30/2020  . Normocytic anemia 05/30/2020  . Transaminitis 05/30/2020  . Pancreatic duct dilated 05/30/2020  . Dry age-related macular degeneration 05/06/2020  . Secondary hyperparathyroidism of renal origin ([place]) 02/20/2020  . Anhedonia 02/20/2020  . Seizure ([place]) 03/18/2019  . Transient atrial fibrillation ([place]) 03/18/2019  . MCI (mild cognitive impairment) 03/18/2019  . History of gout 04/29/2017  . OA (osteoarthritis) of knee 12/17/2016  . Dry skin dermatitis 06/13/2016  . Health maintenance examination 01/30/2016  . Medicare annual wellness visit, subsequent 03/10/2014  . Advanced care planning/counseling discussion 03/10/2014  . PAD (peripheral artery disease) ([place]) 11/11/2013  . Dyspnea 09/18/2011  . PMR (polymyalgia rheumatica) ([place]) 08/27/2011  . Controlled type 2 diabetes mellitus with diabetic nephropathy, with long-term current use of insulin ([place]) 08/04/2009  . Essential hypertension 06/13/2009  . CKD (chronic kidney disease), stage III ([place]) 01/24/2009  . Primary angle-closure glaucoma 11/13/2007  . Osteopenia 11/13/2007  . History of colon cancer 11/13/2007  . Hyperlipidemia associated with type 2 diabetes mellitus ([place]) 10/23/2006   Past Medical History:  Past Medical History:  Diagnosis Date  . Anemia   . Diabetes mellitus, type II ([place])   . Glaucoma    Dr.Hecker  . History of colon cancer 1998   Dr. Lennie Hummer  . Hyperlipemia   . Osteopenia 02/2012, 03/2014   DEXA hip -2.2  . Renal insufficiency   . Seizures  ([place])   . Stenosing tenosynovitis of finger of left hand 2016   index - s/p steroid injection x2   Past Surgical History:  Past Surgical History:  Procedure Laterality Date  . ABI  11/2013   WNL  . APPENDECTOMY    . BILIARY DILATION  05/31/2020   Procedure: BILIARY DILATION;  Surgeon: Jackquline Denmark, MD;  Location: [facility] ENDOSCOPY;  Service: Endoscopy;;  . BIOPSY  05/31/2020   Procedure: BIOPSY;  Surgeon: Jackquline Denmark, MD;  Location: [facility];  Service: Endoscopy;;  . CARPAL TUNNEL RELEASE     Bilateral   . CATARACT EXTRACTION Bilateral 2006   Implants  . CHOLECYSTECTOMY N/A 06/01/2020   Procedure: LAPAROSCOPIC CHOLECYSTECTOMY;  Surgeon: Clovis Riley, MD;  Location: [facility];  Service: General;  Laterality: N/A;  . COLECTOMY  1998   For cancer  . COLONOSCOPY  2008   Dr.Stark, [place] 2011  . COLONOSCOPY  06/2013   1 hyperplastic polyp, ileocecal anastomosis [facility])  . dexa  02/2012, 03/2014   T score -2.2 at hip overall stable  . ENDOSCOPIC RETROGRADE CHOLANGIOPANCREATOGRAPHY (ERCP) WITH PROPOFOL N/A 05/31/2020   Procedure: ENDOSCOPIC RETROGRADE CHOLANGIOPANCREATOGRAPHY (ERCP) WITH PROPOFOL;  Surgeon: Jackquline Denmark, MD;  Location[facility] ENDOSCOPY;  Service: Endoscopy;  Laterality: N/A;  . REFRACTIVE SURGERY    . REMOVAL OF STONES  05/31/2020   Procedure: REMOVAL OF STONES;  Surgeon: Jackquline Denmark, MD;  Location[facility] ENDOSCOPY;  Service: Endoscopy;;  . [name]  05/31/2020   Procedure: [name];  Surgeon: Jackquline Denmark, MD;  Location: [facility] ENDOSCOPY;  Service: Endoscopy;;  . TONSILLECTOMY AND ADENOIDECTOMY    . TOTAL KNEE ARTHROPLASTY Left 12/17/2016   Procedure: LEFT TOTAL KNEE ARTHROPLASTY;  Surgeon:  Gaynelle Arabian, MD;  Location: [facility] ORS;  Service: Orthopedics;  Laterality: Left;   HPI:  86 y.o. female with a history of seizures, on Keppra, currently post-op following laparascopic cholecystectomy, who experienced acute transient episodes of expressive aphasia today. MRI from  06/03/20 shows Multiple punctate foci of acute ischemia within the right centrum  semiovale. No hemorrhage or mass effect. SLE requested.   Assessment / Plan / Recommendation Clinical Impression  Cognitive linguistic evaluation completed at bedside with Pt's son present. Pt reports that she has noticed occasional difficulty "getting words out" for the past several weeks, however exacerbated following her recent surgery. Pt lives alone, does not drive, is independent with taking her medications after set up weekly with family, and does not cook. Her daughter comes by to assist as needed. Pt indicates that she sleeps much of the day, occasionally watches television [employer] and the news). She currently presents with mild expressive aphasia with suspected motor planning deficits characterized by transient dysfluency and dysnomia impacting independence with expressive communication. When she is unable to retrieve the words, she looks to her son for help or states, "that's about all". As stated previously, Pt with some dysnomia noted over the past several weeks, but appears worse following recent surgery. Pt was encouraged to utilize word finding strategies (describe by function, provide salient features, and stop and think about something else before returning to task). Her son indicated that she appeared to have more trouble communicating on the phone last night with him, suspect fatigue is negatively impacting expressive communication as well. Recommend f/u SLP services at next venue of care to address mild expressive aphasia and dysfluency. Pt and son are in agreement with plan of care. No further acute SLP services indicated at this time.    SLP Assessment  SLP Recommendation/Assessment: All further Speech Lanaguage Pathology  needs can be addressed in the next venue of care SLP Visit Diagnosis: Aphasia (R47.01)    Follow Up Recommendations  Home health SLP    Frequency and Duration            SLP Evaluation Cognition  Overall Cognitive Status: Within Functional Limits for tasks assessed Arousal/Alertness: Awake/alert Orientation Level: Oriented X4 Memory: Impaired (recalled 2 of 4 words after 5 minute delay; 4/4 with cue) Memory Impairment: Storage deficit Awareness: Appears intact Problem Solving: Appears intact Safety/Judgment: Appears intact       Comprehension  Auditory Comprehension Overall Auditory Comprehension: Appears within functional limits for tasks assessed Yes/No Questions: Within Functional Limits Commands: Within Functional Limits Conversation: Simple Visual Recognition/Discrimination Discrimination: Within Function Limits Reading Comprehension Reading Status: Within funtional limits    Expression Expression Primary Mode of Expression: Verbal Verbal Expression Overall Verbal Expression: Impaired Initiation: No impairment Automatic Speech: Name;Social Response;Month of year Level of Generative/Spontaneous Verbalization: Conversation Repetition: Impaired Level of Impairment: Sentence level Naming: Impairment Responsive: 76-100% accurate Confrontation: Within functional limits Convergent: Not tested Divergent: 50-74% accurate Verbal Errors: Aware of errors;Phonemic paraphasias (hesitations/dysfluency) Pragmatics: No impairment Effective Techniques: Phonemic cues Non-Verbal Means of Communication: Not applicable Written Expression Dominant Hand: Right Written Expression: Not tested   Oral / Motor  Oral Motor/Sensory Function Overall Oral Motor/Sensory Function: Within functional limits Motor Speech Overall Motor Speech: Impaired Respiration: Within functional limits Phonation: Normal Resonance: Within functional limits Articulation: Within functional limitis Intelligibility: Intelligibility reduced Word: 75-100% accurate Phrase: 75-100% accurate Sentence: 75-100% accurate Conversation: 75-100% accurate Motor Planning: Impaired Level  of Impairment: Phrase Motor Speech Errors: Aware Effective Techniques: Slow rate;Pause (stop,  take a break)   Thank you,  Genene Churn, [place]                     [place] 06/03/2020, 9:41 AM

## 2020-06-03 NOTE — Progress Notes (Signed)
Carotid artery duplex completed. Refer to "CV Proc" under chart review to view preliminary results.  06/03/2020 10:06 AM Kelby Aline., MHA, RVT, RDCS, RDMS

## 2020-06-03 NOTE — Progress Notes (Signed)
PROGRESS NOTE    Taylor Castaneda  PJA:[id number] DOB: Jul 30, 1932 DOA: 05/30/2020 PCP: [name], MD   Brief Narrative:  HPI per Dr. Fuller Plan on 05/30/20 Taylor Castaneda is a 86 y.o. female with medical history significant of diabetes mellitus type 2, hyperlipidemia, seizure disorder, history colon cancer s/p resection, and anemia presented with complaints of passing out while at her condo.  The patient's daughter found her on the floor and was able to help her get to a chair.  However when EMS arrived patient had a witnessed generalized tonic-clonic seizure with urinary incontinence lasting approximately 1 minute prior to self resolving.  They did not have to give any abortive medications.  She had been compliant with her medications and last reported having a seizure over a year ago.  At baseline patient lives alone and handles all of her own medications.  Patient had not been feeling well earlier in the day with complaints of epigastric abdominal pain with radiation to her back.  Denied any recent trauma prior to onset of symptoms.  Symptoms lasted approximately 6 to 7 hours prior to self resolving.  She had taken Tums and Tylenol prior to coming into the hospital. CT scan of the head and cervical spine did not note any acute abnormalities.  CT imaging of the thoracic and lumbar spine noted severe spinal stenosis at L4-5 and large nonobstructing left renal stone.  Bowel sounds were noted to be stable.  Labs at the outside facility yesterday had been significant for WBC 17.8, hemoglobin 11.4, BUN 37, creatinine 1.4, alkaline phosphatase 283, AST 315, ALT 235, total bilirubin 3.3 and lactic acid initially 4->2.6.  COVID-19 and influenza screening was negative.  Right upper quadrant ultrasound revealed cholelithiasis without evidence of acute cholecystitis and cirrhosis.  Case had been discussed with GI here at [facility] who recommended MRCP which revealed cholelithiasis without signs of acute  cholecystitis, suspected small common bile duct stones distally with no duct dilatation, and pancreatic atrophy with dilatation in the mid body tail region with abrupt transition point concerning for possible stricture.  She had been given Keppra 1000 mg IV and 500 mL of normal saline IV fluids.  Patient had been accepted to the hospitalist service to a medical telemetry bed.  Repeat labs revealed WBC 25.6, hemoglobin 10.7, alkaline phosphatase 270, AST 219, ALT 207, and total bilirubin 3.7, lactic acid 2.2.  **Interim History  Patient underwent an ERCP today and she had a periampullary diverticulum and she was noted to have choledocholithiasis which is status post biliary sphincterotomy and sphincteroplasty and balloon extraction.  She had cholelithiasis without cholecystitis and she had a mild gastritis.  Pancreatogram was not obtained and GI recommended watching for pancreatitis, bleeding or perforation and cholangitis.  Recommend a clear liquid diet and continue current medications and continue antibiotics for 5 days total and stopping the Flagyl.  He also recommended obtaining a surgical consultation while inpatient for questionable lap chole and following the results of the CA 19-9, CEA, IgG for.  Dr. Lyndel Safe feels that she will require an EUS as an outpatient for evaluation of pancreatic duct stricture.  After procedure she was sedated and very somnolent and drowsy and unable to be aroused given that she was asleep  General surgery was consulted on 05/31/2020 and after discussion with the patient they recommended a cholecystectomy which the patient was agreeable to.  Patient underwent a cholecystectomy on 06/01/2020 and is doing well postoperatively.  This morning she is slated to  be discharged however started having some word finding difficulties and could not articulate her words properly.  This was an acute change from her baseline and daughter was at bedside.  When I went to evaluate her if she was  talking normally but did also have some disarticulation of her words and after discussion with neurology a code stroke was called.  Neurology evaluated and symptoms had resolved but they felt she still needed further imaging so we are obtaining MRI of the brain and she was transferred to 4 W. for neuro checks closely.  MRI done and showed multiple punctate foci of acute ischemia within the right centrum semiovale with no hemorrhages or mass-effect seen.  Neurology recommended full stroke work-up and she ended up going for an MRA of the head without contrast, echocardiogram as well as Carotid Dopplers.  Neurology felt that she had an acute CVA versus subacute CVA and recommended aspirin and Plavix 325 mg and 75 mg p.o. respectively for 3 months and then just Plavix alone given her severe intracranial stenosis.  PT OT now recommending CIR so we have consulted to CIR for further evaluation and placement at inpatient rehab  Assessment & Plan:   Principal Problem:   Cholelithiasis Active Problems:   Hyperlipidemia associated with type 2 diabetes mellitus ([place])   CKD (chronic kidney disease), stage III (H[facility])   Hyperbilirubinemia   Normocytic anemia   Transaminitis   Pancreatic duct dilated   Common bile duct (CBD) obstruction   Cerebral thrombosis with cerebral infarction   Cholelithiasis and choledocholithiasis status post ERCP on 05/31/2020 and now cholecystectomy postoperative day 2 Obstructive Jaundice with Hyperbilirubinemia and Abnormal LFTs, improving -Acute.  Patient present with complaints of abdominal pain with radiation to her back.   -Initial CT imaging studies significant for cholelithiasis without signs of cholecystitis and cirrhosis.   -GI has been initially consulted and MRCP was recommended.  MRCP revealed cholelithiasis without cholecystitis, small gallstones distally without clear duct dilatation, pancreatic duct dilatation in the mid body region with an abrupt transition, but no  obvious obstructing lesion.   -Patient transferred for need of ERCP and this was done today  -Admit to a medical telemetry -Check blood cultures and showed NGTD at 3 days -Diet had been advanced but she was made n.p.o. when she started having difficulty with her speech and word finding -Start empiric antibiotics of Rocephin and metronidazole; GI recommending stopping Flagyl  -Follow up on CA19-9 was less than 2, CEA was 3.2, IgG4 was 49 -[place] [facility] consult, will follow-up for further recommendations -Underwent ERCP today and she had a periampullary diverticulum and she was noted to have choledocholithiasis which is status post biliary sphincterotomy and sphincteroplasty and balloon extraction.  She had cholelithiasis without cholecystitis and she had a mild gastritis.   -Pancreatogram was not obtained and GI recommended watching for pancreatitis, bleeding or perforation and cholangitis.   -Dr. Lyndel Safe Recommend a clear liquid diet and continue current medications and continue antibiotics for 5 days total and stopping the Flagyl. He also recommended obtaining a surgical consultation while inpatient for questionable lap chole and following the results of the CA 19-9, CEA, IgG4. -T Bili went from 3.3 -> 3.7 -> 3.1 and it is now 3.0 the day before yesterday and yesterday it is 1.4 and today it is 0.9 -General Surgery consulted for further evaluation and they they discussed the possibility of cholecystectomy with the patient.  She is agreeable to surgical intervention and decided to proceed with cholecystectomy and  this was done on 06/01/2020 -Will need PT/OT to evaluate and Treat to determine safe discharge disposition and this can be done after her surgery -Surgery has evaluated patient and cleared her for discharge and PT OT recommending home health PT and OT  Expressive Aphasia in the setting of acute versus subacute CVA the setting of large vessel disease -Patient had significant difficulty time  finding her words on 05/07/1999 and when she did she repeat herself and kept saying "free" -When I went to evaluate her she was improving but still had some difficulty articulating her words -Spoke with neurology who recommended calling a code stroke and a stat head CT was ordered but when she was evaluated by neurology her symptoms resolved so code stroke was canceled and MRI was ordered -patient was transferred to 3 W. for continuing neurochecks every 2 hours next-continue monitor carefully and follow-up with neurology recommendations and follow-up on MRI -MRI was done and showed "Multiple punctate foci of acute ischemia within the right centrum semiovale. No hemorrhage or mass effect." -MRA of the Head w/o Contrast was recommended and showed "Advanced intracranial atherosclerosis. No large vessel occlusion identified, but  Severe stenosis at the Right MCA bifurcation, with long segment involvement of the superior M2. Severe stenosis distal Left ICA siphon and Left MCA superior M2 branch origin. Moderate stenosis Right PCA P2 segment." -Carotid Dopplers done and showed "Velocities in the right ICA are consistent with a 1-39% stenosis.   Left Carotid: Velocities in the left ICA are consistent with a 1-39% stenosis." -ECHOCardiogram done and showed EF of 60 to 65% with grade 2 status function noted within right ventricular systolic function with mildly reduced with no mention of thrombus noted -EEG will be ordered to rule out Seizures showed that the study was suggestive of cortical dysfunction in the right more than left temporal region with nonspecific etiology with no seizures or epileptiform discharges seen throughout the recording -Surgery evaluated and felt that her speech is worse when she stands up; Will continue to observe and get Orthostatics; Will need TED Hose; Repeat Orthostatic VS pending  -SLP evaluated and reccommending Home Health SLP -Neurology recommending ASA 325 mg po Daily and Plavix  75 mg po Daily for 3 months then Plavix along given her sever Intracranial Stenosis -Statin Held due to Abnormal LFTs -PT/OT now recommending CIR so will consult Rehab for Screening and MD for Evaluation to CIR -Follow-up on neurology recommendations  Pancreatic Duct Dilatation  -As seen above.  Lipase was limits and no significant signs of inflammation noted on imaging. -CT of the abdomen with and without contrast -Further Care Per GI -Dr. Lyndel Safe feels that she will require an EUS as an outpatient for evaluation of pancreatic duct stricture as she did not have a Pancreatogram done here  Leukocytosis and lactic acidosis, improving -Acute.  -WBC elevated from 17.8 to 25.6 with lactic acid initially elevated up to 4, but slowly trending down.  -Patient vital signs were noted to be relatively within normal limits not necessarily meeting sepsis criteria. -WBC was trending down again and went from 13.2 is now 12.1 yesterday and is 12.0 today  -LA is trending down and LA was last 2.4 -Follow-up blood cultures and pending  -Check urinalysis and culture if necessary -She is on Abx and on IV Ceftriaxone 2 g q24h -Continue IV fluids and reduce and stop after 1 more day -Trend lactic acid levels  Seizure Disorder -Patient had witnessed generalized tonic-clonic seizure lasting 1 minute with continence  of urine yesterday.   -Suspect this could have been the reason patient had fallen on the floor as well.   -Home medications include Keppra 250 mg twice daily. Patient had received 1000 mg IV yesterday. -Seizure precaution  -Continue home regimen of Keppra for now and Neurology has increased Keppra to 500 mg po BID -She evaluated and she follows up with Dr. Andrey Spearman and [facility] and because of this admission she is found at home with tremorous activity and EEG showed cortical dysfunction.  Neurology thinks that she could have had some cerebral hypoperfusion with orthostatic hypotension so we will  recheck orthostatics in the morning -May need to get an EEG and discussion with neurology.  We will order an EEG given her difficulty finding her words and articulating them this morning concern for a CVA -MRI of the brain is also being obtained  Essential Hypertension but now with Orthostatic Hypotension -Home blood pressure medications include Altace 5 mg daily, Coreg 12.5 mg twice daily, and furosemide 20 mg daily. -Continue to Hold furosemide but resumed Ramipril 5 mg po Daily -Continue Carvedilol 12.5 mg po BID -Continue to Monitor BP per Protocol  -Repeat Orthostatic VS pending  -Last blood pressure was 154/52  HX of A Fib -Currently in NSR -Not on Anticoagulation due to Fall risk -Follow up with Cardiology as an outpateint and Neurology does not feel she has had an embolic stroke  Acute kidney injury superimposed on chronic kidney disease IIIb Metabolic Acidosis -Creatinine was initially noted to be 1.4 (around patient's baseline), but repeat check today 1.76 with BUN 34 on admission; Now BUN/Cr stable at 36/1.67 -Check CK and was 75 -Patient has a small acidosis now with a CO2 of 19, chloride level 114, and anion gap of 9 -IV fluid hydration is now stopped -Avoid nephrotoxic medications, contrast dyes, hypotension and renally dose medications -Repeat CMP in a.m.  Normocytic Anemia -Acute.     -Hemoglobin/hematocrit is relatively stable at 10.9/32.1 yesterday and is now 9.4/29.1 -Check anemia panel and showed an iron level of 50, TIBC 105, TIBC 155, saturation ratio 32%, ferritin level of 393, folate of 21.4, and vitamin B12 of 588 -Continue to monitor for signs and symptoms of bleeding; currently no overt bleeding noted -Repeat CBC in a.m.  Diabetes mellitus type 2, uncontrolled -On admission glucose elevated up to 205.  Last hemoglobin A1c noted to be 8.7 on 02/12/2020.  Home medication includes Lantus 16 units nightly. -Hypoglycemic protocol -Check hemoglobin A1c was  7.9 -Continue half dose of Lantus -CBGs before every meal with moderate SSI  -Diabetes education coronary is following closely -CBGs ranging from 155-229  Abnormal LFTs and Hyperbilirubinemia, slowly improving -Acute.  Patient found to have AST 315->219 -> 86 -> 70 -> 70 -Z> 53, ALT 235->207 -> 119 -> 93 -> 89, and total bilirubin 3.3->3.7 -> 3.1 -> 3.0 -> 1.4 -> 69. -Continue to monitor and trend Hepatic Fxn Panel -Repeat CMP in the AM  Thrombocytopenia -Patient's Platelet count has gone from 113 -> 124 -> 119 -Continue to Monitor and Trend and Continue to Monitor for S/Sx of Bleeding -Repeat CBC in the AM   Elevated Alk Phos -Was elevated to 282 on admission is trended down and is now 192 -Continue to monitor and trend repeat CMP in a.m.  Hyperlipidemia -Hold Atorvastatin due to elevated liver enzymes and resume once they are normalized  DVT prophylaxis: SCDs Code Status: FULL CODE Family Communication: Spoke with daughter at the bedside Disposition Plan: Pending  further evaluation by PT OT, clearance by gastroenterology as well as general surgery.  Now that she had some neurological issues will transfer to 3 W. and obtain further head imaging and discussed with neurology.  She will need neurology clearance prior to safe discharge disposition and likely will go home with home health  Status is: Inpatient  Remains inpatient appropriate because:Unsafe d/c plan, IV treatments appropriate due to intensity of illness or inability to take PO and Inpatient level of care appropriate due to severity of illness   Dispo: The patient is from: Home              Anticipated d/c is to: SNF              Anticipated d/c date is: 2 days              Patient currently is not medically stable to d/c.  Consultants:   Gastroenterology  General Surgery    Procedures:  ERCP Findings:      The scout film was normal. The upper GI tract was traversed under direct       vision without  detailed examination. Mild antral gastritis was noted.      The major papilla was located at lower lip of periampullary diverticula.       The bile duct was deeply cannulated with the short-nosed traction       sphincterotome. Contrast was injected. I personally interpreted the bile       duct images. There was brisk flow of contrast through the ducts. Image       quality was adequate. Contrast extended to the entire biliary tree. 3       filling defects c/w choledocholithiasis was found in CBD which was       mildly dilated to 10 mm. Low insertion of cystic duct was noted. Cystic       duct did fill. Multiple filling defects in the gallbladder consistent       with cholelithiasis.      A 6 mm biliary sphincterotomy was made with a monofilament traction       (standard) sphincterotome using ERBE electrocautery at 12 o'clock       position. There was no post-sphincterotomy bleeding. Due to       periampullary diverticula, we elected to proceed with sphincteroplasty.       Sphincteroplasty was performed using 03-15-11 mm x 5.5 cm CRE balloon to       a maximum balloon size of 10 mm. The biliary tree was swept multiple       times with a 12 mm balloon starting at the bifurcation. All 3 stones       were removed. Nothing was found on postocclusion cholangiogram. The bile       was green. There was no pus.      Pancreatogram was not obtained. We did try few times but the wire would       not find pancreatic duct after the sphincterotomy.      Mild antral gastritis was biopsied with a cold forceps for histology to       r/o HP. Impression:               -Periampullary diverticulum.                           -Choledocholithiasis s/p biliary sphincterotomy,  sphincteroplasty and balloon extraction.                           -Cholelithiasis without cholecystitis.                           -Mild gastritis.                           -Pancreatogram not  obtained. Recommendation:           - Watch for pancreatitis, bleeding, perforation,                            and cholangitis.                           - Clear liquid diet.                           - Continue present medications.                           - Continue antibiotics x total of 5 days. Can stop                            Flagyl.                           - Can obtain surgical consultation while inpatient                            ?Lap chole.                           - Follow results of CA 19?9/CEA/IgG4. I do believe                            she will require EUS as an outpatient for                            evaluation of PD stricture. I will run it by Dr.                            Wendie Agreste. Mansouraty                           - The findings and recommendations were discussed                            with the patient's family.  Laparoscopic cholecystectomy was performed by Dr. Romana Juniper on 06/01/2020  Antimicrobials: Anti-infectives (From admission, onward)   Start     Dose/Rate Route Frequency Ordered Stop   05/30/20 1615  cefTRIAXone (ROCEPHIN) 2 g in sodium chloride 0.9 % 100 mL IVPB        2 g 200 mL/hr over 30 Minutes Intravenous Every 24 hours 05/30/20 1516     05/30/20 1615  metroNIDAZOLE (FLAGYL) tablet 500  mg  Status:  Discontinued        500 mg Oral Every 8 hours 05/30/20 1516 05/31/20 1611        Subjective: Seen And examined at bedside and she states that she still felt the same and she is getting her echocardiogram.  She is able to articulate better today than she did yesterday.  No nausea or vomiting.  Denies any abdominal pain.  No other concerns or complaints at this time but speech gets worse reportedly when she stands up.  We will continue to monitor and repeat orthostatics and have added TED hose.  Objective: Vitals:   06/03/20 1620 06/03/20 1724 06/03/20 1725 06/03/20 1726  BP: (!) 163/67 (!) 147/77 (!) 153/84 (!) 154/52  Pulse: 60  62 64 70  Resp: 18     Temp: 97.6 F (36.4 C)     TempSrc: Oral     SpO2: 100%     Weight:      Height:        Intake/Output Summary (Last 24 hours) at 06/03/2020 1737 Last data filed at 06/03/2020 1300 Gross per 24 hour  Intake 671.14 ml  Output --  Net 671.14 ml   Filed Weights   05/30/20 1645 05/31/20 0912 06/01/20 1045  Weight: 50.8 kg 50.8 kg 50.8 kg   Examination: Physical Exam:  Constitutional: WN/WD elderly Caucasian female who is in no acute distress but does appear slightly uncomfortable getting echocardiogram done. Eyes: Lids and conjunctivae normal, sclerae anicteric  ENMT: External Ears, Nose appear normal. Grossly normal hearing.   Neck: Appears normal, supple, no cervical masses, normal ROM, no appreciable thyromegaly; no JVD Respiratory: Diminished to auscultation bilaterally, no wheezing, rales, rhonchi or crackles. Normal respiratory effort and patient is not tachypenic. No accessory muscle use.  Unlabored breathing Cardiovascular: RRR, no murmurs / rubs / gallops. S1 and S2 auscultated.  Minimal extremity edema Abdomen: Soft, non-tender, distended mildly. Bowel sounds positive.  GU: Deferred. Musculoskeletal: No clubbing / cyanosis of digits/nails. No joint deformity upper and lower extremities.  Skin: No rashes, lesions, ulcers. No induration; Warm and dry.  Neurologic: CN 2-12 grossly intact with no focal deficits. Romberg sign cerebellar reflexes not assessed.  Psychiatric: Normal judgment and insight. Alert and oriented x 3. Slightly anxious mood and appropriate affect.   Data Reviewed: I have personally reviewed following labs and imaging studies  CBC: Recent Labs  Lab 05/29/20 1932 05/30/20 0139 05/31/20 0043 06/01/20 0035 06/02/20 0022 06/03/20 0330  WBC 17.8* 25.6* 12.2* 13.2* 12.1* 12.0*  NEUTROABS 16.3* 23.7*  --  11.6*  --  8.4*  HGB 11.4* 10.7* 9.0* 10.0* 10.9* 9.4*  HCT 35.0* 31.4* 27.5* 29.8* 32.1* 29.1*  MCV 98.3 95.7 97.2 98.3  96.7 99.7  PLT 156 147* 121* 113* 124* 109*   Basic Metabolic Panel: Recent Labs  Lab 05/30/20 1655 05/31/20 0043 06/01/20 0035 06/02/20 0022 06/03/20 0330  NA 140 141 142 140 142  K 4.4 4.0 4.8 4.8 4.2  CL 103 107 112* 111 114*  CO2 24 25 18* 18* 19*  GLUCOSE 157* 193* 255* 243* 127*  BUN 34* 31* 33* 37* 36*  CREATININE 1.76* 1.68* 1.65* 1.64* 1.67*  CALCIUM 8.7* 8.2* 7.8* 7.6* 7.9*  MG  --   --  1.8 1.9 1.9  PHOS  --   --  4.3  --  2.6   GFR: Estimated Creatinine Clearance: 16.8 mL/min (A) (by C-G formula based on SCr of 1.67 mg/dL (H)). Liver Function Tests: Recent  Labs  Lab 05/30/20 0139 05/31/20 0043 06/01/20 0035 06/02/20 0022 06/03/20 0330  AST 219* 86* 70* 70* 53*  ALT 207* 119* 93* 89* 69*  ALKPHOS 270* 208* 235* 242* 192*  BILITOT 3.7* 3.1* 3.0* 1.4* 0.9  PROT 6.2* 5.1* 5.2* 5.7* 4.9*  ALBUMIN 3.1* 2.4* 2.3* 2.4* 2.3*   Recent Labs  Lab 05/29/20 2230  LIPASE 25   No results for input(s): AMMONIA in the last 168 hours. Coagulation Profile: No results for input(s): INR, PROTIME in the last 168 hours. Cardiac Enzymes: Recent Labs  Lab 05/30/20 1717  CKTOTAL 75   BNP (last 3 results) No results for input(s): PROBNP in the last 8760 hours. HbA1C: Recent Labs    06/02/20 2116  HGBA1C 7.9*   CBG: Recent Labs  Lab 06/02/20 1733 06/02/20 2135 06/03/20 0609 06/03/20 1204 06/03/20 1639  GLUCAP 180* 178* 90 96 85   Lipid Profile: No results for input(s): CHOL, HDL, LDLCALC, TRIG, CHOLHDL, LDLDIRECT in the last 72 hours. Thyroid Function Tests: Recent Labs    06/02/20 1430  TSH 1.712   Anemia Panel: Recent Labs    06/01/20 0035 06/02/20 1430  VITAMINB12 588 550  FOLATE 21.4  --   FERRITIN 393*  --   TIBC 155*  --   IRON 50  --   RETICCTPCT 1.2  --    Sepsis Labs: Recent Labs  Lab 05/29/20 2135 05/30/20 0139 05/30/20 1516 05/30/20 1655  LATICACIDVEN 2.6* 2.2* 1.9 2.4*    Recent Results (from the past 240 hour(s))  Resp  Panel by RT-PCR (Flu A&B, Covid) Nasopharyngeal Swab     Status: None   Collection Time: 05/29/20  9:49 PM   Specimen: Nasopharyngeal Swab; Nasopharyngeal(NP) swabs in vial transport medium  Result Value Ref Range Status   SARS Coronavirus 2 by RT PCR NEGATIVE NEGATIVE Final    Comment: (NOTE) SARS-CoV-2 target nucleic acids are NOT DETECTED.  The SARS-CoV-2 RNA is generally detectable in upper respiratory specimens during the acute phase of infection. The lowest concentration of SARS-CoV-2 viral copies this assay can detect is 138 copies/mL. A negative result does not preclude SARS-Cov-2 infection and should not be used as the sole basis for treatment or other patient management decisions. A negative result may occur with  improper specimen collection/handling, submission of specimen other than nasopharyngeal swab, presence of viral mutation(s) within the areas targeted by this assay, and inadequate number of viral copies(<138 copies/mL). A negative result must be combined with clinical observations, patient history, and epidemiological information. The expected result is Negative.  Fact Sheet for Patients:  EntrepreneurPulse.com.au  Fact Sheet for Healthcare Providers:  IncredibleEmployment.be  This test is no t yet approved or cleared by the [place] FDA and  has been authorized for detection and/or diagnosis of SARS-CoV-2 by FDA under an Emergency Use Authorization (EUA). This EUA will remain  in effect (meaning this test can be used) for the duration of the COVID-19 declaration under Section 564(b)(1) of the Act, 21 U.S.C.section 360bbb-3(b)(1), unless the authorization is terminated  or revoked sooner.       Influenza A by PCR NEGATIVE NEGATIVE Final   Influenza B by PCR NEGATIVE NEGATIVE Final    Comment: (NOTE) The Xpert Xpress SARS-CoV-2/FLU/RSV plus assay is intended as an aid in the diagnosis of influenza from Nasopharyngeal  swab specimens and should not be used as a sole basis for treatment. Nasal washings and aspirates are unacceptable for Xpert Xpress SARS-CoV-2/FLU/RSV testing.  Fact Sheet for Patients:  EntrepreneurPulse.com.au  Fact Sheet for Healthcare Providers: IncredibleEmployment.be  This test is not yet approved or cleared by the [place] FDA and has been authorized for detection and/or diagnosis of SARS-CoV-2 by FDA under an Emergency Use Authorization (EUA). This EUA will remain in effect (meaning this test can be used) for the duration of the COVID-19 declaration under Section 564(b)(1) of the Act, 21 U.S.C. section 360bbb-3(b)(1), unless the authorization is terminated or revoked.  Performed at [facility], [address] [place], [place] [address]   Culture, blood (x 2)     Status: None (Preliminary result)   Collection Time: 05/30/20  5:00 PM   Specimen: BLOOD RIGHT ARM  Result Value Ref Range Status   Specimen Description BLOOD RIGHT ARM  Final   Special Requests   Final    BOTTLES DRAWN AEROBIC ONLY Blood Culture results may not be optimal due to an inadequate volume of blood received in culture bottles   Culture   Final    NO GROWTH 4 DAYS Performed at [facility], [place] [address] [place], [place] [address]    Report Status PENDING  Incomplete  Culture, blood (x 2)     Status: None (Preliminary result)   Collection Time: 05/30/20  5:03 PM   Specimen: BLOOD RIGHT ARM  Result Value Ref Range Status   Specimen Description BLOOD RIGHT ARM  Final   Special Requests   Final    BOTTLES DRAWN AEROBIC ONLY Blood Culture results may not be optimal due to an inadequate volume of blood received in culture bottles   Culture   Final    NO GROWTH 4 DAYS Performed at [facility], [place] [address], [place], [place] [address]    Report Status PENDING  Incomplete  Surgical pcr screen     Status: None   Collection Time:  05/31/20  8:24 PM   Specimen: Nasal Mucosa; Nasal Swab  Result Value Ref Range Status   MRSA, PCR NEGATIVE NEGATIVE Final   Staphylococcus aureus NEGATIVE NEGATIVE Final    Comment: (NOTE) The Xpert SA Assay (FDA approved for NASAL specimens in patients 33 years of age and older), is one component of a comprehensive surveillance program. It is not intended to diagnose infection nor to guide or monitor treatment. Performed at [facility], [place] [address], [place], [place] [address]      RN Pressure Injury Documentation:     Estimated body mass index is 22.62 kg/m as calculated from the following:   Height as of this encounter: _0  (1.499 m).   Weight as of this encounter: 50.8 kg.  Malnutrition Type:  Nutrition Problem: Inadequate oral intake Etiology: altered GI function   Malnutrition Characteristics:  Signs/Symptoms: NPO status   Nutrition Interventions:  Interventions: Refer to RD note for recommendations   Radiology Studies: MR ANGIO HEAD WO CONTRAST  Result Date: 06/03/2020 CLINICAL DATA:  86 year old female with transient aphasia and punctate foci of acute ischemia in the right hemisphere white matter on MRI this morning. EXAM: MRA HEAD WITHOUT CONTRAST TECHNIQUE: Angiographic images of the Circle of Willis were obtained using MRA technique without intravenous contrast. COMPARISON:  Brain MRI 0414 hours today. CTA head and neck 03/05/2019. FINDINGS: And no intracranial mass effect or ventriculomegaly. Antegrade flow in the posterior circulation with codominant patent distal vertebral arteries. No distal vertebral or vertebrobasilar junction stenosis. Patent right PICA origin, left AICA which may be dominant. Patent basilar artery although diminutive distally with  mild to moderate mid basilar stenosis on series 1055, image 10. The basilar artery functionally terminates in SCAs. Fetal type bilateral PCA origins. Both SCA is are patent. Bilateral PCAs are patent,  with mild irregularity on the left and mild to moderate right PCA P2 irregularity and stenosis. Antegrade flow in both ICA siphons. On the right there is mild siphon irregularity and stenosis. But there is severe left siphon supraclinoid stenosis as demonstrated on series 1045, image 16. Posterior communicating artery origins are normal. Patent bilateral carotid termini. Patent MCA and ACA origins. Diminutive or absent anterior communicating artery. Bilateral visible ACA branches are within normal limits. Left MCA M1 segment bifurcates early without stenosis but there is severe stenosis at the origin of a superior left MCA M2 branch on series 1045 image 13. Other left MCA branches are within normal limits. Right MCA M1 is patent but there is severe stenosis at the right MCA bifurcation also affecting the proximal superior M2 branch, as well as moderate stenosis at the origin of the inferior M2 branch (series 1045, image 10). No complete occlusion of a right MCA branch identified. IMPRESSION: 1. Advanced intracranial atherosclerosis. No large vessel occlusion identified, but: - Severe stenosis at the Right MCA bifurcation, with long segment involvement of the superior M2. - Severe stenosis distal Left ICA siphon and Left MCA superior M2 branch origin. 2. Moderate stenosis Right PCA P2 segment. Electronically Signed   By: Genevie Ann M.D.   On: 06/03/2020 11:33   MR BRAIN WO CONTRAST  Result Date: 06/03/2020 CLINICAL DATA:  Transient expressive aphasia EXAM: MRI HEAD WITHOUT CONTRAST TECHNIQUE: Multiplanar, multiecho pulse sequences of the brain and surrounding structures were obtained without intravenous contrast. COMPARISON:  03/06/2019 FINDINGS: Brain: There are multiple punctate foci of abnormal diffusion restriction within the right centrum semiovale. No contralateral diffusion abnormality. No acute or chronic hemorrhage. There is multifocal hyperintense T2-weighted signal within the white matter. Generalized  volume loss without a clear lobar predilection. The midline structures are normal. Vascular: Major flow voids are preserved. Skull and upper cervical spine: Normal calvarium and skull base. Visualized upper cervical spine and soft tissues are normal. Sinuses/Orbits:No paranasal sinus fluid levels or advanced mucosal thickening. No mastoid or middle ear effusion. Normal orbits. IMPRESSION: Multiple punctate foci of acute ischemia within the right centrum semiovale. No hemorrhage or mass effect. Electronically Signed   By: Ulyses Jarred M.D.   On: 06/03/2020 04:50   EEG adult  Result Date: 06/02/2020 Lora Havens, MD     06/02/2020 11:09 PM Patient Name: Taylor Castaneda MRN: [id number] Epilepsy Attending: Lora Havens Referring Physician/Provider: Dr Carlisle Cater Date: 06/02/2020 Duration: 23.50 mins Patient history:  86yo F with acute transient episodes of expressive aphasia today. EEG to evaluate for siezure Level of alertness: Awake, asleep AEDs during EEG study: LEV Technical aspects: This EEG study was done with scalp electrodes positioned according to the 10-20 International system of electrode placement. Electrical activity was acquired at a sampling rate of 500Hz and reviewed with a high frequency filter of 70Hz and a low frequency filter of 1Hz. EEG data were recorded continuously and digitally stored. Description: The posterior dominant rhythm consists of 7.5 Hz activity of moderate voltage (25-35 uV) seen predominantly in posterior head regions, symmetric and reactive to eye opening and eye closing. Sleep was characterized by vertex waves, sleep spindles (12 to 14 Hz), maximal frontocentral region.  EEG showed intermittent right>left temporal 2-3Hz delta slowing.  Hyperventilation and photic stimulation  were not performed.   ABNORMALITY -Intermittent slow, right>left temporal region IMPRESSION: This study is suggestive of cortical dysfunction in right more than left temporal region, non  specific etiology. No seizures or epileptiform discharges were seen throughout the recording. Lora Havens   ECHOCARDIOGRAM COMPLETE  Result Date: 06/03/2020    ECHOCARDIOGRAM REPORT   Patient Name:   Taylor Castaneda Date of Exam: 06/03/2020 Medical Rec #:  [id number]      Height:       59.0 in Accession #:    [id number]     Weight:       112.0 lb Date of Birth:  January 23, 1933      BSA:          1.442 m Patient Age:    64 years       BP:           157/67 mmHg Patient Gender: F              HR:           61 bpm. Exam Location:  Inpatient Procedure: 2D Echo, Cardiac Doppler and Color Doppler Indications:    Stroke  History:        Patient has prior history of Echocardiogram examinations, most                 recent 03/06/2019. Abnormal ECG, Stroke, Arrythmias:Atrial                 Fibrillation, Signs/Symptoms:Dyspnea; Risk Factors:Hypertension                 and Diabetes.  Sonographer:    Roseanna Rainbow RDCS Referring Phys: [id number] [place] [facility]  Sonographer Comments: Technically difficult study due to poor echo windows. Image acquisition challenging due to patient body habitus. IMPRESSIONS  1. Left ventricular ejection fraction, by estimation, is 60 to 65%. The left ventricle has normal function. The left ventricle has no regional wall motion abnormalities. Left ventricular diastolic parameters are consistent with Grade II diastolic dysfunction (pseudonormalization).  2. Right ventricular systolic function is mildly reduced. The right ventricular size is normal. There is severely elevated pulmonary artery systolic pressure. The estimated right ventricular systolic pressure is 10.2 mmHg.  3. The mitral valve is normal in structure. Mild mitral valve regurgitation. No evidence of mitral stenosis. Moderate mitral annular calcification.  4. The aortic valve is tricuspid. Aortic valve regurgitation is not visualized. Mild aortic valve sclerosis is present, with no evidence of aortic valve stenosis.  5. The inferior  vena cava is normal in size with <50% respiratory variability, suggesting right atrial pressure of 8 mmHg.  6. There is a pleural effusion. FINDINGS  Left Ventricle: Left ventricular ejection fraction, by estimation, is 60 to 65%. The left ventricle has normal function. The left ventricle has no regional wall motion abnormalities. The left ventricular internal cavity size was normal in size. There is  no left ventricular hypertrophy. Left ventricular diastolic parameters are consistent with Grade II diastolic dysfunction (pseudonormalization). Right Ventricle: The right ventricular size is normal. No increase in right ventricular wall thickness. Right ventricular systolic function is mildly reduced. There is severely elevated pulmonary artery systolic pressure. The tricuspid regurgitant velocity is 3.63 m/s, and with an assumed right atrial pressure of 8 mmHg, the estimated right ventricular systolic pressure is 72.5 mmHg. Left Atrium: Left atrial size was normal in size. Right Atrium: Right atrial size was normal in size. Pericardium: There is a pleural effusion. There is  no evidence of pericardial effusion. Mitral Valve: The mitral valve is normal in structure. There is mild calcification of the mitral valve leaflet(s). Moderate mitral annular calcification. Mild mitral valve regurgitation. No evidence of mitral valve stenosis. MV peak gradient, 8.4 mmHg. The mean mitral valve gradient is 2.0 mmHg. Tricuspid Valve: The tricuspid valve is normal in structure. Tricuspid valve regurgitation is trivial. Aortic Valve: The aortic valve is tricuspid. Aortic valve regurgitation is not visualized. Mild aortic valve sclerosis is present, with no evidence of aortic valve stenosis. Pulmonic Valve: The pulmonic valve was normal in structure. Pulmonic valve regurgitation is not visualized. Aorta: The aortic root is normal in size and structure. Venous: The inferior vena cava is normal in size with less than 50% respiratory  variability, suggesting right atrial pressure of 8 mmHg. IAS/Shunts: No atrial level shunt detected by color flow Doppler.  LEFT VENTRICLE PLAX 2D LVIDd:         3.15 cm     Diastology LVIDs:         2.00 cm     LV e' medial:    3.57 cm/s LV PW:         1.05 cm     LV E/e' medial:  19.8 LV IVS:        1.00 cm     LV e' lateral:   2.76 cm/s LVOT diam:     1.50 cm     LV E/e' lateral: 25.6 LV SV:         41 LV SV Index:   29 LVOT Area:     1.77 cm  LV Volumes (MOD) LV vol d, MOD A2C: 34.0 ml LV vol d, MOD A4C: 40.7 ml LV vol s, MOD A2C: 15.3 ml LV vol s, MOD A4C: 10.5 ml LV SV MOD A2C:     18.7 ml LV SV MOD A4C:     40.7 ml LV SV MOD BP:      24.1 ml RIGHT VENTRICLE            IVC RV S prime:     5.77 cm/s  IVC diam: 1.80 cm TAPSE (M-mode): 1.2 cm LEFT ATRIUM             Index       RIGHT ATRIUM           Index LA diam:        3.30 cm 2.29 cm/m  RA Area:     10.30 cm LA Vol (A2C):   15.9 ml 11.03 ml/m RA Volume:   19.80 ml  13.74 ml/m LA Vol (A4C):   19.1 ml 13.25 ml/m LA Biplane Vol: 17.7 ml 12.28 ml/m  AORTIC VALVE LVOT Vmax:   101.00 cm/s LVOT Vmean:  64.900 cm/s LVOT VTI:    0.234 m  AORTA Ao Root diam: 2.30 cm MITRAL VALVE               TRICUSPID VALVE MV Area (PHT): 4.15 cm    TR Peak grad:   52.7 mmHg MV Peak grad:  8.4 mmHg    TR Vmax:        363.00 cm/s MV Mean grad:  2.0 mmHg MV Vmax:       1.45 m/s    SHUNTS MV Vmean:      56.3 cm/s   Systemic VTI:  0.23 m MV Decel Time: 183 msec    Systemic Diam: 1.50 cm MV E velocity: 70.78 cm/s Loralie Champagne MD Electronically signed  by Loralie Champagne MD Signature Date/Time: 06/03/2020/12:42:02 PM    Final    VAS US CAROTID  Result Date: 06/03/2020 Carotid Arterial Duplex Study Indications:  Speech disturbance. Risk Factors: Hyperlipidemia, Diabetes. Performing Technologist: Maudry Mayhew MHA, RDMS, RVT, RDCS  Examination Guidelines: A complete evaluation includes B-mode imaging, spectral Doppler, color Doppler, and power Doppler as needed of all  accessible portions of each vessel. Bilateral testing is considered an integral part of a complete examination. Limited examinations for reoccurring indications may be performed as noted.  Right Carotid Findings: +----------+-------+-------+--------+---------------------------------+--------+           PSV    EDV    StenosisPlaque Description               Comments           cm/s   cm/s                                                     +----------+-------+-------+--------+---------------------------------+--------+ CCA Prox  55     7                                                        +----------+-------+-------+--------+---------------------------------+--------+ CCA Distal62     9              smooth and heterogenous                   +----------+-------+-------+--------+---------------------------------+--------+ ICA Prox  54     16             smooth and heterogenous                   +----------+-------+-------+--------+---------------------------------+--------+ ICA Distal73     20                                                       +----------+-------+-------+--------+---------------------------------+--------+ ECA       68                    heterogenous, irregular and                                               calcific                                  +----------+-------+-------+--------+---------------------------------+--------+ +----------+--------+-------+----------------+-------------------+           PSV cm/sEDV cmsDescribe        Arm Pressure (mmHG) +----------+--------+-------+----------------+-------------------+ [id number]             Multiphasic, WNL                    +----------+--------+-------+----------------+-------------------+ +---------+--------+--+--------+-+---------+ VertebralPSV cm/s36EDV cm/s7Antegrade +---------+--------+--+--------+-+---------+  Left Carotid Findings:  +----------+--------+--------+--------+---------------------+------------------+  PSV cm/sEDV cm/sStenosisPlaque Description   Comments           +----------+--------+--------+--------+---------------------+------------------+ CCA Prox  58      9                                    intimal thickening +----------+--------+--------+--------+---------------------+------------------+ CCA Distal70      13              smooth and                                                                heterogenous                            +----------+--------+--------+--------+---------------------+------------------+ ICA Prox  71      14              heterogenous and                                                          irregular                               +----------+--------+--------+--------+---------------------+------------------+ ICA Distal80      12                                                      +----------+--------+--------+--------+---------------------+------------------+ ECA       87                      smooth, heterogenous                                                      and calcific                            +----------+--------+--------+--------+---------------------+------------------+ +----------+--------+--------+----------------+-------------------+           PSV cm/sEDV cm/sDescribe        Arm Pressure (mmHG) +----------+--------+--------+----------------+-------------------+ [id number]              Multiphasic, WNL                    +----------+--------+--------+----------------+-------------------+ +---------+--------+--+--------+-+---------+ VertebralPSV cm/s37EDV cm/s6Antegrade +---------+--------+--+--------+-+---------+   Summary: Right Carotid: Velocities in the right ICA are consistent with a 1-39% stenosis. Left Carotid: Velocities in the left ICA are consistent with a 1-39% stenosis.  Vertebrals:  Bilateral vertebral arteries demonstrate antegrade flow. Subclavians: Normal flow hemodynamics were seen in bilateral subclavian              arteries. *See table(s)  above for measurements and observations.     Preliminary    Scheduled Meds: . acetaminophen  1,000 mg Oral Q6H  . aspirin EC  325 mg Oral Daily  . carvedilol  12.5 mg Oral BID  . clopidogrel  75 mg Oral Daily  . insulin aspart  0-5 Units Subcutaneous QHS  . insulin aspart  0-9 Units Subcutaneous TID WC  . insulin glargine  8 Units Subcutaneous QHS  . levETIRAcetam  500 mg Oral BID  . ramipril  5 mg Oral Daily  . sodium chloride flush  3 mL Intravenous Q12H   Continuous Infusions: . sodium chloride Stopped (06/02/20 1700)  . cefTRIAXone (ROCEPHIN)  IV Stopped (06/02/20 1733)  . lactated ringers 10 mL/hr at 06/01/20 1243    LOS: 4 days   Kerney Elbe, DO [facility] PAGER is on AMION  If 7PM-7AM, please contact night-coverage [email or web address]

## 2020-06-03 NOTE — Progress Notes (Signed)
Patient ID: Taylor Castaneda, female   DOB: 1932-07-31, 86 y.o.   MRN: [id number] [facility] Surgery Progress Note:   1 Day Post-Op  Subjective: Speaking clearly this morning to me, however she states her speech is worse when she stands up.  Had a little bit of pain around the incisions early after surgery and took some Tylenol and felt better.  No abdominal complaints this morning. Objective: Vital signs in last 24 hours: Temp:  [97.5 F (36.4 C)-98.3 F (36.8 C)] 97.7 F (36.5 C) (12/31 0808) Pulse Rate:  [59-70] 61 (12/31 0808) Resp:  [16-20] 18 (12/31 0808) BP: (134-171)/(64-118) 157/67 (12/31 0808) SpO2:  [99 %-100 %] 100 % (12/31 0808)  Intake/Output from previous day: 12/30 0701 - 12/31 0700 In: 411.1 [P.O.:300; I.V.:111.1] Out: 25 [Urine:25] Intake/Output this shift: No intake/output data recorded.  Physical Exam: Work of breathing is normal.  Incisions are bland.    Lab Results:  Results for orders placed or performed during the hospital encounter of 05/30/20 (from the past 48 hour(s))  Glucose, capillary     Status: Abnormal   Collection Time: 06/01/20 10:12 AM  Result Value Ref Range   Glucose-Capillary 165 (H) 70 - 99 mg/dL    Comment: Glucose reference range applies only to samples taken after fasting for at least 8 hours.  Surgical pathology     Status: None   Collection Time: 06/01/20  1:19 PM  Result Value Ref Range   SURGICAL PATHOLOGY      SURGICAL PATHOLOGY CASE: [id number] PATIENT: Taylor Castaneda Surgical Pathology Report     Clinical History: Cholecystitis (cm)     FINAL MICROSCOPIC DIAGNOSIS:  A. GALLBLADDER, CHOLECYSTECTOMY: - Chronic cholecystitis with cholelithiasis.   GROSS DESCRIPTION:  Specimen: Gallbladder, received fresh. Size/?Intact: 6.7 x 3 x 2.2 cm, intact. Serosal surface: Yellow-pink, focally granular. Mucosa/Wall: Pink-green, smooth/Up to 0.6 cm thick. Contents: Multiple black friable choleliths ranging from  minute fragments to 0.4 cm. Cystic duct: Patent. Block Summary: Representative sections in one block.  SW 06/01/2020    Final Diagnosis performed by Gillie Manners, MD.   Electronically signed 06/02/2020 Technical and / or Professional components performed at [facility]. [facility], [place] [address] [place], [place] [address].  Immunohistochemistry Technical component (if applicable) was performed at [facility]. [address] [place], [place], [place] [address].   IMMUNOHISTOCHEMISTRY DISCLAIMER (if applicable): Some of these immunohistochemical stains may have been developed and the performance characteristics determine by [facility]. Some may not have been cleared or approved by the U.S. Food and Drug Administration. The FDA has determined that such clearance or approval is not necessary. This test is used for clinical purposes. It should not be regarded as investigational or for research. This laboratory is certified under the [place] (CLIA-88) as qualified to perform high complexity clinical laboratory testing.  The controls stained appropriately.   Glucose, capillary     Status: Abnormal   Collection Time: 06/01/20  2:06 PM  Result Value Ref Range   Glucose-Capillary 155 (H) 70 - 99 mg/dL    Comment: Glucose reference range applies only to samples taken after fasting for at least 8 hours.  Glucose, capillary     Status: Abnormal   Collection Time: 06/01/20  4:47 PM  Result Value Ref Range   Glucose-Capillary 167 (H) 70 - 99 mg/dL    Comment: Glucose reference range applies only to samples taken after fasting for at least 8  hours.  Glucose, capillary     Status: Abnormal   Collection Time: 06/01/20  9:01 PM  Result Value Ref Range   Glucose-Capillary 229 (H) 70 - 99 mg/dL    Comment: Glucose reference range applies only to samples taken after fasting for at least 8 hours.  CBC      Status: Abnormal   Collection Time: 06/02/20 12:22 AM  Result Value Ref Range   WBC 12.1 (H) 4.0 - 10.5 K/uL   RBC 3.32 (L) 3.87 - 5.11 MIL/uL   Hemoglobin 10.9 (L) 12.0 - 15.0 g/dL   HCT 32.1 (L) 36.0 - 46.0 %   MCV 96.7 80.0 - 100.0 fL   MCH 32.8 26.0 - 34.0 pg   MCHC 34.0 30.0 - 36.0 g/dL   RDW 12.9 11.5 - 15.5 %   Platelets 124 (L) 150 - 400 K/uL    Comment: REPEATED TO VERIFY   nRBC 0.0 0.0 - 0.2 %    Comment: Performed at [facility], [place] [address], [place], [place] [address]  Magnesium     Status: None   Collection Time: 06/02/20 12:22 AM  Result Value Ref Range   Magnesium 1.9 1.7 - 2.4 mg/dL    Comment: Performed at [facility], [place] [address], [place], [place] [address]  Comprehensive metabolic panel     Status: Abnormal   Collection Time: 06/02/20 12:22 AM  Result Value Ref Range   Sodium 140 135 - 145 mmol/L   Potassium 4.8 3.5 - 5.1 mmol/L   Chloride 111 98 - 111 mmol/L   CO2 18 (L) 22 - 32 mmol/L   Glucose, Bld 243 (H) 70 - 99 mg/dL    Comment: Glucose reference range applies only to samples taken after fasting for at least 8 hours.   BUN 37 (H) 8 - 23 mg/dL   Creatinine, Ser 1.64 (H) 0.44 - 1.00 mg/dL   Calcium 7.6 (L) 8.9 - 10.3 mg/dL   Total Protein 5.7 (L) 6.5 - 8.1 g/dL   Albumin 2.4 (L) 3.5 - 5.0 g/dL   AST 70 (H) 15 - 41 U/L   ALT 89 (H) 0 - 44 U/L   Alkaline Phosphatase 242 (H) 38 - 126 U/L   Total Bilirubin 1.4 (H) 0.3 - 1.2 mg/dL   GFR, Estimated 30 (L) >60 mL/min    Comment: (NOTE) Calculated using the CKD-EPI Creatinine Equation (2021)    Anion gap 11 5 - 15    Comment: Performed at [facility], [place] [address], [place], [place] [address]  Glucose, capillary     Status: Abnormal   Collection Time: 06/02/20  7:56 AM  Result Value Ref Range   Glucose-Capillary 173 (H) 70 - 99 mg/dL    Comment: Glucose reference range applies only to samples taken after fasting for at least 8 hours.  Glucose, capillary     Status: Abnormal    Collection Time: 06/02/20 12:04 PM  Result Value Ref Range   Glucose-Capillary 216 (H) 70 - 99 mg/dL    Comment: Glucose reference range applies only to samples taken after fasting for at least 8 hours.  Glucose, capillary     Status: Abnormal   Collection Time: 06/02/20  1:08 PM  Result Value Ref Range   Glucose-Capillary 201 (H) 70 - 99 mg/dL    Comment: Glucose reference range applies only to samples taken after fasting for at least 8 hours.  TSH     Status: None  Collection Time: 06/02/20  2:30 PM  Result Value Ref Range   TSH 1.712 0.350 - 4.500 uIU/mL    Comment: Performed by a 3rd Generation assay with a functional sensitivity of <=0.01 uIU/mL. Performed at [facility], [place] [address], [place], [place] [address]   Vitamin B12     Status: None   Collection Time: 06/02/20  2:30 PM  Result Value Ref Range   Vitamin B-12 550 180 - 914 pg/mL    Comment: (NOTE) This assay is not validated for testing neonatal or myeloproliferative syndrome specimens for Vitamin B12 levels. Performed at [facility], [place] [address] [place], [place] [address]   Glucose, capillary     Status: Abnormal   Collection Time: 06/02/20  5:33 PM  Result Value Ref Range   Glucose-Capillary 180 (H) 70 - 99 mg/dL    Comment: Glucose reference range applies only to samples taken after fasting for at least 8 hours.  Hemoglobin A1c     Status: Abnormal   Collection Time: 06/02/20  9:16 PM  Result Value Ref Range   Hgb A1c MFr Bld 7.9 (H) 4.8 - 5.6 %    Comment: (NOTE) Pre diabetes:          5.7%-6.4%  Diabetes:              >6.4%  Glycemic control for   <7.0% adults with diabetes    Mean Plasma Glucose 180.03 mg/dL    Comment: Performed at [facility] [address] [place], [place] [address]  Glucose, capillary     Status: Abnormal   Collection Time: 06/02/20  9:35 PM  Result Value Ref Range   Glucose-Capillary 178 (H) 70 - 99 mg/dL    Comment: Glucose reference range  applies only to samples taken after fasting for at least 8 hours.  CBC with Differential/Platelet     Status: Abnormal   Collection Time: 06/03/20  3:30 AM  Result Value Ref Range   WBC 12.0 (H) 4.0 - 10.5 K/uL   RBC 2.92 (L) 3.87 - 5.11 MIL/uL   Hemoglobin 9.4 (L) 12.0 - 15.0 g/dL   HCT 29.1 (L) 36.0 - 46.0 %   MCV 99.7 80.0 - 100.0 fL   MCH 32.2 26.0 - 34.0 pg   MCHC 32.3 30.0 - 36.0 g/dL   RDW 13.1 11.5 - 15.5 %   Platelets 119 (L) 150 - 400 K/uL    Comment: REPEATED TO VERIFY PLATELET COUNT CONFIRMED BY SMEAR    nRBC 0.0 0.0 - 0.2 %   Neutrophils Relative % 70 %   Neutro Abs 8.4 (H) 1.7 - 7.7 K/uL   Lymphocytes Relative 20 %   Lymphs Abs 2.4 0.7 - 4.0 K/uL   Monocytes Relative 7 %   Monocytes Absolute 0.8 0.1 - 1.0 K/uL   Eosinophils Relative 1 %   Eosinophils Absolute 0.1 0.0 - 0.5 K/uL   Basophils Relative 0 %   Basophils Absolute 0.0 0.0 - 0.1 K/uL   Immature Granulocytes 2 %   Abs Immature Granulocytes 0.24 (H) 0.00 - 0.07 K/uL    Comment: Performed at [facility], [address] [place], [place] [address]  Comprehensive metabolic panel     Status: Abnormal   Collection Time: 06/03/20  3:30 AM  Result Value Ref Range   Sodium 142 135 - 145 mmol/L   Potassium 4.2 3.5 - 5.1 mmol/L   Chloride 114 (H) 98 - 111 mmol/L  CO2 19 (L) 22 - 32 mmol/L   Glucose, Bld 127 (H) 70 - 99 mg/dL    Comment: Glucose reference range applies only to samples taken after fasting for at least 8 hours.   BUN 36 (H) 8 - 23 mg/dL   Creatinine, Ser 1.67 (H) 0.44 - 1.00 mg/dL   Calcium 7.9 (L) 8.9 - 10.3 mg/dL   Total Protein 4.9 (L) 6.5 - 8.1 g/dL   Albumin 2.3 (L) 3.5 - 5.0 g/dL   AST 53 (H) 15 - 41 U/L   ALT 69 (H) 0 - 44 U/L   Alkaline Phosphatase 192 (H) 38 - 126 U/L   Total Bilirubin 0.9 0.3 - 1.2 mg/dL   GFR, Estimated 30 (L) >60 mL/min    Comment: (NOTE) Calculated using the CKD-EPI Creatinine Equation (2021)    Anion gap 9 5 - 15    Comment: Performed at [facility], [place] [address], [place], [place] [address]  Phosphorus     Status: None   Collection Time: 06/03/20  3:30 AM  Result Value Ref Range   Phosphorus 2.6 2.5 - 4.6 mg/dL    Comment: Performed at [facility] [address] [place], [place] [address]  Magnesium     Status: None   Collection Time: 06/03/20  3:30 AM  Result Value Ref Range   Magnesium 1.9 1.7 - 2.4 mg/dL    Comment: Performed at [facility] [address] [place], [place] [address]  Glucose, capillary     Status: None   Collection Time: 06/03/20  6:09 AM  Result Value Ref Range   Glucose-Capillary 90 70 - 99 mg/dL    Comment: Glucose reference range applies only to samples taken after fasting for at least 8 hours.    Radiology/Results: MR BRAIN WO CONTRAST  Result Date: 06/03/2020 CLINICAL DATA:  Transient expressive aphasia EXAM: MRI HEAD WITHOUT CONTRAST TECHNIQUE: Multiplanar, multiecho pulse sequences of the brain and surrounding structures were obtained without intravenous contrast. COMPARISON:  03/06/2019 FINDINGS: Brain: There are multiple punctate foci of abnormal diffusion restriction within the right centrum semiovale. No contralateral diffusion abnormality. No acute or chronic hemorrhage. There is multifocal hyperintense T2-weighted signal within the white matter. Generalized volume loss without a clear lobar predilection. The midline structures are normal. Vascular: Major flow voids are preserved. Skull and upper cervical spine: Normal calvarium and skull base. Visualized upper cervical spine and soft tissues are normal. Sinuses/Orbits:No paranasal sinus fluid levels or advanced mucosal thickening. No mastoid or middle ear effusion. Normal orbits. IMPRESSION: Multiple punctate foci of acute ischemia within the right centrum semiovale. No hemorrhage or mass effect. Electronically Signed   By: Ulyses Jarred M.D.   On: 06/03/2020 04:50   EEG adult  Result Date: 06/02/2020 Lora Havens, MD      06/02/2020 11:09 PM Patient Name: Taylor Castaneda MRN: [id number] Epilepsy Attending: Lora Havens Referring Physician/Provider: Dr Carlisle Cater Date: 06/02/2020 Duration: 23.50 mins Patient history:  86yo F with acute transient episodes of expressive aphasia today. EEG to evaluate for siezure Level of alertness: Awake, asleep AEDs during EEG study: LEV Technical aspects: This EEG study was done with scalp electrodes positioned according to the 10-20 International system of electrode placement. Electrical activity was acquired at a sampling rate of 500Hz  and reviewed with a high frequency filter of 70Hz  and a low frequency filter of 1Hz . EEG data were recorded continuously and digitally stored. Description: The posterior dominant  rhythm consists of 7.5 Hz activity of moderate voltage (25-35 uV) seen predominantly in posterior head regions, symmetric and reactive to eye opening and eye closing. Sleep was characterized by vertex waves, sleep spindles (12 to 14 Hz), maximal frontocentral region.  EEG showed intermittent right>left temporal 2-3Hz  delta slowing.  Hyperventilation and photic stimulation were not performed.   ABNORMALITY -Intermittent slow, right>left temporal region IMPRESSION: This study is suggestive of cortical dysfunction in right more than left temporal region, non specific etiology. No seizures or epileptiform discharges were seen throughout the recording. Priyanka Barbra Sarks    Anti-infectives: Anti-infectives (From admission, onward)   Start     Dose/Rate Route Frequency Ordered Stop   05/30/20 1615  cefTRIAXone (ROCEPHIN) 2 g in sodium chloride 0.9 % 100 mL IVPB        2 g 200 mL/hr over 30 Minutes Intravenous Every 24 hours 05/30/20 1516     05/30/20 1615  metroNIDAZOLE (FLAGYL) tablet 500 mg  Status:  Discontinued        500 mg Oral Every 8 hours 05/30/20 1516 05/31/20 1611      Assessment/Plan: Problem List: Patient Active Problem List   Diagnosis Date Noted  . Cerebral  thrombosis with cerebral infarction 06/03/2020  . Common bile duct (CBD) obstruction   . Choledocholithiasis 05/30/2020  . Cholelithiasis 05/30/2020  . Hyperbilirubinemia 05/30/2020  . Normocytic anemia 05/30/2020  . Transaminitis 05/30/2020  . Pancreatic duct dilated 05/30/2020  . Dry age-related macular degeneration 05/06/2020  . Secondary hyperparathyroidism of renal origin ([place]) 02/20/2020  . Anhedonia 02/20/2020  . Seizure ([place]) 03/18/2019  . Transient atrial fibrillation ([place]) 03/18/2019  . MCI (mild cognitive impairment) 03/18/2019  . History of gout 04/29/2017  . OA (osteoarthritis) of knee 12/17/2016  . Dry skin dermatitis 06/13/2016  . Health maintenance examination 01/30/2016  . Medicare annual wellness visit, subsequent 03/10/2014  . Advanced care planning/counseling discussion 03/10/2014  . PAD (peripheral artery disease) ([place]) 11/11/2013  . Dyspnea 09/18/2011  . PMR (polymyalgia rheumatica) ([place]) 08/27/2011  . Controlled type 2 diabetes mellitus with diabetic nephropathy, with long-term current use of insulin ([place]) 08/04/2009  . Essential hypertension 06/13/2009  . CKD (chronic kidney disease), stage III ([place]) 01/24/2009  . Primary angle-closure glaucoma 11/13/2007  . Osteopenia 11/13/2007  . History of colon cancer 11/13/2007  . Hyperlipidemia associated with type 2 diabetes mellitus ([place]) 10/23/2006    Doing well post ERCP and lap chole.   Aphasia developed yesterday, undergoing work-up from medicine and neurology teams Surgery team will be available as needed, please call with any questions.   LOS: 4 days   Felicie Morn, [facility] [phone] to reach the surgeon on call.    06/03/2020 9:06 AM

## 2020-06-03 NOTE — Evaluation (Addendum)
Occupational Therapy Evaluation Patient Details Name: Taylor Castaneda MRN: [id number] DOB: 1933-05-12 Today's Date: 06/03/2020    History of Present Illness 86 y.o. female presenting after syncopal episode followed by seizure. RUQ ultrasound (+) cholelithiasis w/o acute cholecystitis and cirrhosis s/p ERCP on 12/28 by Dr. Lyndel Safe. Patient also found to have AKI on CKD. CT head (-) for acute findings. Evidence of mild chronic small vessel ischemic disease and moderately advanced cerebral atrophy. MRI (+) intracranial stenosis and multiple new punctate foci of acute ischemia within the right centrum  semiovale. PMHx significant for DMII, HLD, seizures, and Hx colon CA s/p resection.   Clinical Impression   PTA patient was living alone in a 2nd floor apartment style condo with elevator access and was Mod I with ADLs/IADLs grossly. Son present at bedside provided home set-up and PLOF as patient was unable to. Son reports patient used a SPC intermittently in home and used a RW at all times in community dwellings. Patient had recently stopped cooking because she was afraid she'd forget to turn off the burner. Patient currently presents below baseline level of function with deficits in cognition. Patient oriented to first and last name but not D.O.B and states that shes "in the woods" when asked "Where are we?". Son notes this is not typical for the patient. Patient currently requires Min A grossly for ADLs, ADL transfers, and Min guard to Min A for functional mobility with use of RW and mod cues for management and proximity to DME. Patient also demonstrates deficits in cognition including orientation, safety awareness, short-term memory and attention. Patient would benefit from continued acute OT services in prep for safe d/c to next level of care with recommendation for CIR placement.     Follow Up Recommendations  CIR    Equipment Recommendations  None recommended by OT (Patient has necessary DME)     Recommendations for Other Services       Precautions / Restrictions Precautions Precautions: Fall Precaution Comments: monitor for vitals and neuro symptoms Restrictions Weight Bearing Restrictions: No      Mobility Bed Mobility Overal bed mobility: Needs Assistance Bed Mobility: Supine to Sit;Sit to Supine     Supine to sit: Min assist Sit to supine: Min assist        Transfers Overall transfer level: Needs assistance Equipment used: Rolling walker (2 wheeled);1 person hand held assist Transfers: Sit to/from Stand Sit to Stand: Min assist         General transfer comment: Min A for power negotiation. Cues for hand placement.    Balance Overall balance assessment: Needs assistance Sitting-balance support: Feet supported Sitting balance-Leahy Scale: Fair     Standing balance support: Bilateral upper extremity supported Standing balance-Leahy Scale: Poor Standing balance comment: Reliant on BUE on RW.                           ADL either performed or assessed with clinical judgement   ADL Overall ADL's : Needs assistance/impaired     Grooming: Minimal assistance;Standing           Upper Body Dressing : Set up;Sitting   Lower Body Dressing: Minimal assistance;Sit to/from stand   Toilet Transfer: Minimal assistance;RW   Toileting- Clothing Manipulation and Hygiene: Minimal assistance;Sit to/from stand       Functional mobility during ADLs: Minimal assistance;Rolling walker General ADL Comments: Min A for safety with cues for walker management and proximity. Patient frequently bumping into furniture  in room on R and L.     Vision Baseline Vision/History: No visual deficits Patient Visual Report: No change from baseline Vision Assessment?: No apparent visual deficits     Perception     Praxis      Pertinent Vitals/Pain Pain Assessment: No/denies pain     Hand Dominance Right   Extremity/Trunk Assessment Upper Extremity  Assessment Upper Extremity Assessment: Overall WFL for tasks assessed   Lower Extremity Assessment Lower Extremity Assessment: Defer to PT evaluation   Cervical / Trunk Assessment Cervical / Trunk Assessment: Kyphotic   Communication Communication Communication: Expressive difficulties (had moments of slower speech with word finding difficulties)   Cognition Arousal/Alertness: Awake/alert Behavior During Therapy: WFL for tasks assessed/performed Overall Cognitive Status: Impaired/Different from baseline Area of Impairment: Awareness (had difficulties in the moment with expressing herself)                           Awareness: Intellectual   General Comments: pt is gnerally not demonstrating focal weakness but has speech changes that cleared up with word finding and ability to retrieve ST memory information   General Comments  BP 179/74, HR 60bpm, and SpO2 99% on RA seated EOB. Patient with waxing/waning periods of congition. Initially A&Ox1. Toward end of session confusion seemed to subside slightly.    Exercises Exercises: Other exercises (LE strength 4- to 4+)   Shoulder Instructions      Home Living Family/patient expects to be discharged to:: Other (Comment) (Apartment style condo 2nd floor with elevator access.) Living Arrangements: Alone Available Help at Discharge: Family;Available PRN/intermittently;Available 24 hours/day (continual help initially) Type of Home: House Home Access: Level entry     Home Layout: One level     Bathroom Shower/Tub: Occupational psychologist: Standard     Home Equipment: Environmental consultant - 2 wheels;Cane - single point;Grab bars - tub/shower;Shower seat      Lives With: Alone    Prior Functioning/Environment Level of Independence: Independent;Independent with assistive device(s)        Comments: Intermittent use of SPC in home. Use of RW in community dwellings.        OT Problem List: Decreased strength;Impaired  balance (sitting and/or standing);Decreased cognition;Decreased safety awareness;Decreased knowledge of use of DME or AE      OT Treatment/Interventions: Self-care/ADL training;Therapeutic exercise;Energy conservation;DME and/or AE instruction;Therapeutic activities;Cognitive remediation/compensation;Patient/family education;Balance training    OT Goals(Current goals can be found in the care plan section) Acute Rehab OT Goals Patient Stated Goal: To return home. OT Goal Formulation: With patient Time For Goal Achievement: 06/17/20 Potential to Achieve Goals: Good ADL Goals Pt Will Perform Upper Body Dressing: sitting;with set-up Pt Will Perform Lower Body Dressing: with supervision;sit to/from stand Pt Will Transfer to Toilet: with supervision;ambulating;grab bars Pt Will Perform Toileting - Clothing Manipulation and hygiene: with supervision;sit to/from stand Additional ADL Goal #1: Patient will be A&Ox4 demonstrating increased cognitive orientation.  OT Frequency: Min 2X/week   Barriers to D/C:            Co-evaluation              AM-PAC OT "6 Clicks" Daily Activity     Outcome Measure Help from another person eating meals?: None Help from another person taking care of personal grooming?: A Little Help from another person toileting, which includes using toliet, bedpan, or urinal?: A Little Help from another person bathing (including washing, rinsing, drying)?: A Little Help from another  person to put on and taking off regular upper body clothing?: A Little Help from another person to put on and taking off regular lower body clothing?: A Little 6 Click Score: 19   End of Session Equipment Utilized During Treatment: Gait belt;Rolling walker  Activity Tolerance: Patient tolerated treatment well Patient left: in bed;with call bell/phone within reach;with chair alarm set;with family/visitor present  OT Visit Diagnosis: Unsteadiness on feet (R26.81);Muscle weakness  (generalized) (M62.81);History of falling (Z91.81);Other symptoms and signs involving cognitive function                Time: 1040-1109 OT Time Calculation (min): 29 min Charges:  OT General Charges $OT Visit: 1 Visit OT Evaluation $OT Eval Moderate Complexity: 1 Mod OT Treatments $Self Care/Home Management : 8-22 mins  Inri Sobieski H. OTR/L Supplemental OT, Department of rehab services [phone]  Balian Schaller R H. 06/03/2020, 12:17 PM

## 2020-06-03 NOTE — Progress Notes (Signed)
Physical Therapy Treatment Patient Details Name: Taylor Castaneda MRN: [id number] DOB: 26-Mar-1933 Today's Date: 06/03/2020    History of Present Illness 86 y.o. female presenting after syncopal episode followed by seizure. RUQ ultrasound (+) cholelithiasis w/o acute cholecystitis and cirrhosis s/p ERCP on 12/28 by Dr. Lyndel Safe. Patient also found to have AKI on CKD. CT head (-) for acute findings. Evidence of mild chronic small vessel ischemic disease and moderately advanced cerebral atrophy. MRI (+) intracranial stenosis. PMHx significant for DMII, HLD, seizures, and Hx colon CA s/p resection.    PT Comments    Pt tolerates treatment well but demonstrates more significant balance and cognitive deficits at this time. Pt with multiple losses of balance with UE support of RW and is unable to complete BERG due to safety concerns and high falls risk. Pt has difficulty following multi-step commands and demonstrates poor awareness of her current deficits and their implications on her safety. PT updates recommendations to CIR as the pt was indpendent prior to admission and demonstrates the potential to make significant gains in gait and balance with high intensity inpatient PT services.   Follow Up Recommendations  CIR     Equipment Recommendations  Rolling walker with 5" wheels    Recommendations for Other Services Rehab consult     Precautions / Restrictions Precautions Precautions: Fall Precaution Comments: monitor for vitals and neuro symptoms Restrictions Weight Bearing Restrictions: No    Mobility  Bed Mobility Overal bed mobility: Needs Assistance Bed Mobility: Supine to Sit     Supine to sit: Supervision;HOB elevated     General bed mobility comments: increased time  Transfers Overall transfer level: Needs assistance Equipment used: Rolling walker (2 wheeled) Transfers: Sit to/from Stand Sit to Stand: Min assist         General transfer comment: PT assistance for safety,  impulsively standing initially prior to RW presence  Ambulation/Gait Ambulation/Gait assistance: Min assist Gait Distance (Feet): 80 Feet Assistive device: Rolling walker (2 wheeled) Gait Pattern/deviations: Step-to pattern Gait velocity: reduced Gait velocity interpretation: <1.8 ft/sec, indicate of risk for recurrent falls General Gait Details: pt with short step-to gait, multiple losses of balance with use of RW, increased time for turns   Marine scientist Rankin (Stroke Patients Only) Modified Rankin (Stroke Patients Only) Pre-Morbid Rankin Score: No significant disability Modified Rankin: Moderately severe disability     Balance Overall balance assessment: Needs assistance Sitting-balance support: No upper extremity supported;Feet supported Sitting balance-Leahy Scale: Good Sitting balance - Comments: close supervision for safety   Standing balance support: During functional activity;No upper extremity supported Standing balance-Leahy Scale: Poor Standing balance comment: min-modA without UE support, intermittent minA with UE support of RW                 Standardized Balance Assessment Standardized Balance Assessment : Berg Balance Test Berg Balance Test Sit to Stand: Needs minimal aid to stand or to stabilize Standing Unsupported: Able to stand 30 seconds unsupported Sitting with Back Unsupported but Feet Supported on Floor or Stool: Able to sit 2 minutes under supervision Stand to Sit: Uses backs of legs against chair to control descent Transfers: Needs one person to assist Standing Unsupported with Eyes Closed: Able to stand 3 seconds Standing Ubsupported with Feet Together: Needs help to attain position and unable to hold for 15 seconds From Standing, Reach Forward with Outstretched Arm: Reaches forward but needs supervision  From Standing Position, Pick up Object from Floor: Unable to try/needs assist to keep  balance From Standing Position, Turn to Look Behind Over each Shoulder: Needs assist to keep from losing balance and falling Turn 360 Degrees: Needs assistance while turning Standing Unsupported, Alternately Place Feet on Step/Stool: Needs assistance to keep from falling or unable to try Standing Unsupported, One Foot in Front: Loses balance while stepping or standing Standing on One Leg: Unable to try or needs assist to prevent fall Total Score: 12        Cognition Arousal/Alertness: Awake/alert Behavior During Therapy: WFL for tasks assessed/performed Overall Cognitive Status: Impaired/Different from baseline Area of Impairment: Attention;Memory;Orientation;Following commands;Awareness;Safety/judgement;Problem solving                 Orientation Level: Disoriented to;Time (requires cueing, initially reports january) Current Attention Level: Sustained Memory: Decreased recall of precautions;Decreased short-term memory Following Commands: Follows one step commands consistently;Follows multi-step commands inconsistently Safety/Judgement: Decreased awareness of safety;Decreased awareness of deficits Awareness: Intellectual Problem Solving: Slow processing;Difficulty sequencing;Requires verbal cues;Requires tactile cues        Exercises      General Comments General comments (skin integrity, edema, etc.): BP stable, 164/79 when mobilizing      Pertinent Vitals/Pain Pain Assessment: No/denies pain    Home Living                      Prior Function            PT Goals (current goals can now be found in the care plan section) Acute Rehab PT Goals Patient Stated Goal: pt goal to go home, family goal for rehab Progress towards PT goals: Not progressing toward goals - comment (limited by balance and poor cognition)    Frequency    Min 4X/week      PT Plan Discharge plan needs to be updated    Co-evaluation              AM-PAC PT "6 Clicks"  Mobility   Outcome Measure  Help needed turning from your back to your side while in a flat bed without using bedrails?: A Little Help needed moving from lying on your back to sitting on the side of a flat bed without using bedrails?: A Little Help needed moving to and from a bed to a chair (including a wheelchair)?: A Little Help needed standing up from a chair using your arms (e.g., wheelchair or bedside chair)?: A Little Help needed to walk in hospital room?: A Little Help needed climbing 3-5 steps with a railing? : A Lot 6 Click Score: 17    End of Session Equipment Utilized During Treatment: Gait belt Activity Tolerance: Patient tolerated treatment well Patient left: in chair;with call bell/phone within reach;with chair alarm set Nurse Communication: Mobility status PT Visit Diagnosis: Unsteadiness on feet (R26.81);Muscle weakness (generalized) (M62.81);Other symptoms and signs involving the nervous system (R29.898)     Time: 1655-3748 PT Time Calculation (min) (ACUTE ONLY): 26 min  Charges:  $Gait Training: 8-22 mins $Neuromuscular Re-education: 8-22 mins                     Zenaida Niece, PT, DPT Acute Rehabilitation Pager: [phone]    Zenaida Niece 06/03/2020, 2:19 PM

## 2020-06-03 NOTE — TOC Progression Note (Signed)
Transition of Care [facility]) - Progression Note    Patient Details  Name: COLBI SCHILTZ MRN: [id number] Date of Birth: [birth year]  [facility] CM/SW Contact  Pollie Friar, RN Phone Number: 06/03/2020, 3:04 PM  Clinical Narrative:    PT recommendations has changed to CIR. CM spoke to daughter and she is interested in this for her mother.  TOC following.   Expected Discharge Plan: IP Rehab Facility Barriers to Discharge: Continued Medical Work up  Expected Discharge Plan and Services Expected Discharge Plan: [place]   Discharge Planning Services: CM Consult Post Acute Care Choice: IP Rehab Living arrangements for the past 2 months: Apartment                 DME Arranged: N/A                     Social Determinants of Health (SDOH) Interventions    Readmission Risk Interventions No flowsheet data found.

## 2020-06-03 NOTE — Progress Notes (Addendum)
Inpatient Rehab Admissions Coordinator Note:   Per PT updated recommendations, pt was screened for CIR candidacy by Shann Medal, PT, DPT.  At this time we are recommending CIR consult.  Note OT evaluation this AM still recommending HHOT f/u.  Please contact me with questions.   Shann Medal, PT, DPT [phone] 06/03/20 2:36 PM

## 2020-06-03 NOTE — CV Procedure (Signed)
2D echo attempted, patient in MRI. Will try later.

## 2020-06-03 NOTE — Progress Notes (Addendum)
STROKE TEAM PROGRESS NOTE   INTERVAL HISTORY Son at the bedside. Pt awake alert and orientated, interactive. Per record, pt admitted 03/2019 for found down, right gaze left sided weakness, tremulous convulsion. EEG no seizure, CT and MRI no stroke. CTA head and neck with b/l siphon high grade stenosis. Concerning for status epilepticus, and put on keppra.   Per son, pt had low BP for the last 2-3 weeks. Yesterday, she was found down again by daughter, again had some tremulous activity with EMS. In [facility], she was found to have cholecystitis, and sent to [facility] for Sx. Post surgery, she had wax and wane of speech difficulty, neuro consulted and MRI showed faint signal at right hemisphere WM. MRA head showed again severe stenosis b/l siphons and M2s.   Vitals:   06/03/20 0332 06/03/20 0448 06/03/20 0808 06/03/20 1206  BP: (!) 171/67 (!) 163/118 (!) 157/67 139/65  Pulse: (!) 59 63 61 60  Resp: 20 20 18 18   Temp: 97.8 F (36.6 C) 97.9 F (36.6 C) 97.7 F (36.5 C) 97.7 F (36.5 C)  TempSrc: Oral Oral Oral Oral  SpO2: 99% 100% 100% 100%  Weight:      Height:       CBC:  Recent Labs  Lab 06/01/20 0035 06/02/20 0022 06/03/20 0330  WBC 13.2* 12.1* 12.0*  NEUTROABS 11.6*  --  8.4*  HGB 10.0* 10.9* 9.4*  HCT 29.8* 32.1* 29.1*  MCV 98.3 96.7 99.7  PLT 113* 124* 003*   Basic Metabolic Panel:  Recent Labs  Lab 06/01/20 0035 06/02/20 0022 06/03/20 0330  NA 142 140 142  K 4.8 4.8 4.2  CL 112* 111 114*  CO2 18* 18* 19*  GLUCOSE 255* 243* 127*  BUN 33* 37* 36*  CREATININE 1.65* 1.64* 1.67*  CALCIUM 7.8* 7.6* 7.9*  MG 1.8 1.9 1.9  PHOS 4.3  --  2.6   Lipid Panel: No results for input(s): CHOL, TRIG, HDL, CHOLHDL, VLDL, LDLCALC in the last 168 hours. HgbA1c:  Recent Labs  Lab 06/02/20 2116  HGBA1C 7.9*   Urine Drug Screen: No results for input(s): LABOPIA, COCAINSCRNUR, LABBENZ, AMPHETMU, THCU, LABBARB in the last 168 hours.  Alcohol Level No results for input(s): ETH in the last  168 hours.  IMAGING past 24 hours MR ANGIO HEAD WO CONTRAST  Result Date: 06/03/2020 CLINICAL DATA:  86 year old female with transient aphasia and punctate foci of acute ischemia in the right hemisphere white matter on MRI this morning. EXAM: MRA HEAD WITHOUT CONTRAST TECHNIQUE: Angiographic images of the Circle of Willis were obtained using MRA technique without intravenous contrast. COMPARISON:  Brain MRI 0414 hours today. CTA head and neck 03/05/2019. FINDINGS: And no intracranial mass effect or ventriculomegaly. Antegrade flow in the posterior circulation with codominant patent distal vertebral arteries. No distal vertebral or vertebrobasilar junction stenosis. Patent right PICA origin, left AICA which may be dominant. Patent basilar artery although diminutive distally with mild to moderate mid basilar stenosis on series 1055, image 10. The basilar artery functionally terminates in SCAs. Fetal type bilateral PCA origins. Both SCA is are patent. Bilateral PCAs are patent, with mild irregularity on the left and mild to moderate right PCA P2 irregularity and stenosis. Antegrade flow in both ICA siphons. On the right there is mild siphon irregularity and stenosis. But there is severe left siphon supraclinoid stenosis as demonstrated on series 1045, image 16. Posterior communicating artery origins are normal. Patent bilateral carotid termini. Patent MCA and ACA origins. Diminutive or absent anterior  communicating artery. Bilateral visible ACA branches are within normal limits. Left MCA M1 segment bifurcates early without stenosis but there is severe stenosis at the origin of a superior left MCA M2 branch on series 1045 image 13. Other left MCA branches are within normal limits. Right MCA M1 is patent but there is severe stenosis at the right MCA bifurcation also affecting the proximal superior M2 branch, as well as moderate stenosis at the origin of the inferior M2 branch (series 1045, image 10). No complete  occlusion of a right MCA branch identified. IMPRESSION: 1. Advanced intracranial atherosclerosis. No large vessel occlusion identified, but: - Severe stenosis at the Right MCA bifurcation, with long segment involvement of the superior M2. - Severe stenosis distal Left ICA siphon and Left MCA superior M2 branch origin. 2. Moderate stenosis Right PCA P2 segment. Electronically Signed   By: Genevie Ann M.D.   On: 06/03/2020 11:33   MR BRAIN WO CONTRAST  Result Date: 06/03/2020 CLINICAL DATA:  Transient expressive aphasia EXAM: MRI HEAD WITHOUT CONTRAST TECHNIQUE: Multiplanar, multiecho pulse sequences of the brain and surrounding structures were obtained without intravenous contrast. COMPARISON:  03/06/2019 FINDINGS: Brain: There are multiple punctate foci of abnormal diffusion restriction within the right centrum semiovale. No contralateral diffusion abnormality. No acute or chronic hemorrhage. There is multifocal hyperintense T2-weighted signal within the white matter. Generalized volume loss without a clear lobar predilection. The midline structures are normal. Vascular: Major flow voids are preserved. Skull and upper cervical spine: Normal calvarium and skull base. Visualized upper cervical spine and soft tissues are normal. Sinuses/Orbits:No paranasal sinus fluid levels or advanced mucosal thickening. No mastoid or middle ear effusion. Normal orbits. IMPRESSION: Multiple punctate foci of acute ischemia within the right centrum semiovale. No hemorrhage or mass effect. Electronically Signed   By: Ulyses Jarred M.D.   On: 06/03/2020 04:50   EEG adult  Result Date: 06/02/2020 Lora Havens, MD     06/02/2020 11:09 PM Patient Name: Taylor Castaneda MRN: [id number] Epilepsy Attending: Lora Havens Referring Physician/Provider: Dr Carlisle Cater Date: 06/02/2020 Duration: 23.50 mins Patient history:  86yo F with acute transient episodes of expressive aphasia today. EEG to evaluate for siezure Level of  alertness: Awake, asleep AEDs during EEG study: LEV Technical aspects: This EEG study was done with scalp electrodes positioned according to the 10-20 International system of electrode placement. Electrical activity was acquired at a sampling rate of 500Hz  and reviewed with a high frequency filter of 70Hz  and a low frequency filter of 1Hz . EEG data were recorded continuously and digitally stored. Description: The posterior dominant rhythm consists of 7.5 Hz activity of moderate voltage (25-35 uV) seen predominantly in posterior head regions, symmetric and reactive to eye opening and eye closing. Sleep was characterized by vertex waves, sleep spindles (12 to 14 Hz), maximal frontocentral region.  EEG showed intermittent right>left temporal 2-3Hz  delta slowing.  Hyperventilation and photic stimulation were not performed.   ABNORMALITY -Intermittent slow, right>left temporal region IMPRESSION: This study is suggestive of cortical dysfunction in right more than left temporal region, non specific etiology. No seizures or epileptiform discharges were seen throughout the recording. Lora Havens   ECHOCARDIOGRAM COMPLETE  Result Date: 06/03/2020    ECHOCARDIOGRAM REPORT   Patient Name:   Taylor Castaneda Date of Exam: 06/03/2020 Medical Rec #:  [id number]      Height:       59.0 in Accession #:    [id number]     Weight:  112.0 lb Date of Birth:  1932-09-14      BSA:          1.442 m Patient Age:    86 years       BP:           157/67 mmHg Patient Gender: F              HR:           61 bpm. Exam Location:  Inpatient Procedure: 2D Echo, Cardiac Doppler and Color Doppler Indications:    Stroke  History:        Patient has prior history of Echocardiogram examinations, most                 recent 03/06/2019. Abnormal ECG, Stroke, Arrythmias:Atrial                 Fibrillation, Signs/Symptoms:Dyspnea; Risk Factors:Hypertension                 and Diabetes.  Sonographer:    Roseanna Rainbow RDCS Referring Phys: [id number] [facility]  Sonographer Comments: Technically difficult study due to poor echo windows. Image acquisition challenging due to patient body habitus. IMPRESSIONS  1. Left ventricular ejection fraction, by estimation, is 60 to 65%. The left ventricle has normal function. The left ventricle has no regional wall motion abnormalities. Left ventricular diastolic parameters are consistent with Grade II diastolic dysfunction (pseudonormalization).  2. Right ventricular systolic function is mildly reduced. The right ventricular size is normal. There is severely elevated pulmonary artery systolic pressure. The estimated right ventricular systolic pressure is 96.2 mmHg.  3. The mitral valve is normal in structure. Mild mitral valve regurgitation. No evidence of mitral stenosis. Moderate mitral annular calcification.  4. The aortic valve is tricuspid. Aortic valve regurgitation is not visualized. Mild aortic valve sclerosis is present, with no evidence of aortic valve stenosis.  5. The inferior vena cava is normal in size with <50% respiratory variability, suggesting right atrial pressure of 8 mmHg.  6. There is a pleural effusion. FINDINGS  Left Ventricle: Left ventricular ejection fraction, by estimation, is 60 to 65%. The left ventricle has normal function. The left ventricle has no regional wall motion abnormalities. The left ventricular internal cavity size was normal in size. There is  no left ventricular hypertrophy. Left ventricular diastolic parameters are consistent with Grade II diastolic dysfunction (pseudonormalization). Right Ventricle: The right ventricular size is normal. No increase in right ventricular wall thickness. Right ventricular systolic function is mildly reduced. There is severely elevated pulmonary artery systolic pressure. The tricuspid regurgitant velocity is 3.63 m/s, and with an assumed right atrial pressure of 8 mmHg, the estimated right ventricular systolic pressure is 95.2 mmHg. Left Atrium:  Left atrial size was normal in size. Right Atrium: Right atrial size was normal in size. Pericardium: There is a pleural effusion. There is no evidence of pericardial effusion. Mitral Valve: The mitral valve is normal in structure. There is mild calcification of the mitral valve leaflet(s). Moderate mitral annular calcification. Mild mitral valve regurgitation. No evidence of mitral valve stenosis. MV peak gradient, 8.4 mmHg. The mean mitral valve gradient is 2.0 mmHg. Tricuspid Valve: The tricuspid valve is normal in structure. Tricuspid valve regurgitation is trivial. Aortic Valve: The aortic valve is tricuspid. Aortic valve regurgitation is not visualized. Mild aortic valve sclerosis is present, with no evidence of aortic valve stenosis. Pulmonic Valve: The pulmonic valve was normal in structure. Pulmonic valve regurgitation is not  visualized. Aorta: The aortic root is normal in size and structure. Venous: The inferior vena cava is normal in size with less than 50% respiratory variability, suggesting right atrial pressure of 8 mmHg. IAS/Shunts: No atrial level shunt detected by color flow Doppler.  LEFT VENTRICLE PLAX 2D LVIDd:         3.15 cm     Diastology LVIDs:         2.00 cm     LV e' medial:    3.57 cm/s LV PW:         1.05 cm     LV E/e' medial:  19.8 LV IVS:        1.00 cm     LV e' lateral:   2.76 cm/s LVOT diam:     1.50 cm     LV E/e' lateral: 25.6 LV SV:         41 LV SV Index:   29 LVOT Area:     1.77 cm  LV Volumes (MOD) LV vol d, MOD A2C: 34.0 ml LV vol d, MOD A4C: 40.7 ml LV vol s, MOD A2C: 15.3 ml LV vol s, MOD A4C: 10.5 ml LV SV MOD A2C:     18.7 ml LV SV MOD A4C:     40.7 ml LV SV MOD BP:      24.1 ml RIGHT VENTRICLE            IVC RV S prime:     5.77 cm/s  IVC diam: 1.80 cm TAPSE (M-mode): 1.2 cm LEFT ATRIUM             Index       RIGHT ATRIUM           Index LA diam:        3.30 cm 2.29 cm/m  RA Area:     10.30 cm LA Vol (A2C):   15.9 ml 11.03 ml/m RA Volume:   19.80 ml  13.74 ml/m LA  Vol (A4C):   19.1 ml 13.25 ml/m LA Biplane Vol: 17.7 ml 12.28 ml/m  AORTIC VALVE LVOT Vmax:   101.00 cm/s LVOT Vmean:  64.900 cm/s LVOT VTI:    0.234 m  AORTA Ao Root diam: 2.30 cm MITRAL VALVE               TRICUSPID VALVE MV Area (PHT): 4.15 cm    TR Peak grad:   52.7 mmHg MV Peak grad:  8.4 mmHg    TR Vmax:        363.00 cm/s MV Mean grad:  2.0 mmHg MV Vmax:       1.45 m/s    SHUNTS MV Vmean:      56.3 cm/s   Systemic VTI:  0.23 m MV Decel Time: 183 msec    Systemic Diam: 1.50 cm MV E velocity: 70.78 cm/s Loralie Champagne MD Electronically signed by Loralie Champagne MD Signature Date/Time: 06/03/2020/12:42:02 PM    Final    VAS US CAROTID  Result Date: 06/03/2020 Carotid Arterial Duplex Study Indications:  Speech disturbance. Risk Factors: Hyperlipidemia, Diabetes. Performing Technologist: Maudry Mayhew MHA, RDMS, RVT, RDCS  Examination Guidelines: A complete evaluation includes B-mode imaging, spectral Doppler, color Doppler, and power Doppler as needed of all accessible portions of each vessel. Bilateral testing is considered an integral part of a complete examination. Limited examinations for reoccurring indications may be performed as noted.  Right Carotid Findings: +----------+-------+-------+--------+---------------------------------+--------+           PSV  EDV    StenosisPlaque Description               Comments           cm/s   cm/s                                                     +----------+-------+-------+--------+---------------------------------+--------+ CCA Prox  55     7                                                        +----------+-------+-------+--------+---------------------------------+--------+ CCA Distal62     9              smooth and heterogenous                   +----------+-------+-------+--------+---------------------------------+--------+ ICA Prox  54     16             smooth and heterogenous                    +----------+-------+-------+--------+---------------------------------+--------+ ICA Distal73     20                                                       +----------+-------+-------+--------+---------------------------------+--------+ ECA       68                    heterogenous, irregular and                                               calcific                                  +----------+-------+-------+--------+---------------------------------+--------+ +----------+--------+-------+----------------+-------------------+           PSV cm/sEDV cmsDescribe        Arm Pressure (mmHG) +----------+--------+-------+----------------+-------------------+ [id number]             Multiphasic, WNL                    +----------+--------+-------+----------------+-------------------+ +---------+--------+--+--------+-+---------+ VertebralPSV cm/s36EDV cm/s7Antegrade +---------+--------+--+--------+-+---------+  Left Carotid Findings: +----------+--------+--------+--------+---------------------+------------------+           PSV cm/sEDV cm/sStenosisPlaque Description   Comments           +----------+--------+--------+--------+---------------------+------------------+ CCA Prox  58      9                                    intimal thickening +----------+--------+--------+--------+---------------------+------------------+ CCA Distal70      13              smooth and  heterogenous                            +----------+--------+--------+--------+---------------------+------------------+ ICA Prox  71      14              heterogenous and                                                          irregular                               +----------+--------+--------+--------+---------------------+------------------+ ICA Distal80      12                                                       +----------+--------+--------+--------+---------------------+------------------+ ECA       87                      smooth, heterogenous                                                      and calcific                            +----------+--------+--------+--------+---------------------+------------------+ +----------+--------+--------+----------------+-------------------+           PSV cm/sEDV cm/sDescribe        Arm Pressure (mmHG) +----------+--------+--------+----------------+-------------------+ [id number]              Multiphasic, WNL                    +----------+--------+--------+----------------+-------------------+ +---------+--------+--+--------+-+---------+ VertebralPSV cm/s37EDV cm/s6Antegrade +---------+--------+--+--------+-+---------+   Summary: Right Carotid: Velocities in the right ICA are consistent with a 1-39% stenosis. Left Carotid: Velocities in the left ICA are consistent with a 1-39% stenosis. Vertebrals:  Bilateral vertebral arteries demonstrate antegrade flow. Subclavians: Normal flow hemodynamics were seen in bilateral subclavian              arteries. *See table(s) above for measurements and observations.     Preliminary     PHYSICAL EXAM  Temp:  [97.5 F (36.4 C)-97.9 F (36.6 C)] 97.6 F (36.4 C) (12/31 1620) Pulse Rate:  [59-64] 60 (12/31 1620) Resp:  [16-20] 18 (12/31 1620) BP: (134-171)/(64-118) 163/67 (12/31 1620) SpO2:  [99 %-100 %] 100 % (12/31 1620)  General - Well nourished, well developed, in no apparent distress.  Ophthalmologic - fundi not visualized due to noncooperation.  Cardiovascular - Regular rhythm and rate.  Mental Status -  Level of arousal and orientation to time, place, and person were intact. Language including expression, naming, repetition, comprehension was assessed and found intact. No apraxia Attention span and concentration were impaired, not able to backward spelling WORLD.  Cranial Nerves II  - XII - II - Visual field intact OU. III, IV, VI - Extraocular movements intact. V - Facial  sensation intact bilaterally. VII - Facial movement intact bilaterally. VIII - Hearing & vestibular intact bilaterally. X - Palate elevates symmetrically. XI - Chin turning & shoulder shrug intact bilaterally. XII - Tongue protrusion intact.  Motor Strength - The patient's strength was normal in all extremities and pronator drift was absent.  Bulk was normal and fasciculations were absent.   Motor Tone - Muscle tone was assessed at the neck and appendages and was normal.  Reflexes - The patient's reflexes were symmetrical in all extremities and she had no pathological reflexes.  Sensory - Light touch, temperature/pinprick were assessed and were symmetrical.    Coordination - The patient had normal movements in the hands with no ataxia or dysmetria.  Tremor was absent.  Gait and Station - deferred.   ASSESSMENT/PLAN Taylor Castaneda is a 86 y.o. female with history of seizures and colon CA s/p resection presenting to [facility] after being found down by family who subsequently seized. Found to have mild cholecystitis and transferred to [facility]. [facility] s/p lap chole 12/29. In hospital 12/30 she developed transient expressive aphasia.   Stroke:  very faint DWI restriction of several puctate R centrum semiovale infarcts likely due to severe intracranial stenosis.   CT head 12/26 No acute abnormality. Small vessel disease. Atrophy.   MRI 12/31 faint DWI signal of multiple punctate R centrum semiovale infarcts  MRA  No LVO. Advanced atherosclerosis: severe R MCA bifurcation w/ M2 involvement, severe distal L ICA siphon and L MCA superior M2 branch origin, moderate R P2  Carotid Doppler  B ICA 1-39% stenosis, VAs antegrade   2D Echo EF 60-65%. No source of embolus   LDL 60 (02/2020)   HgbA1c 7.9  VTE prophylaxis - SCDs   aspirin 81 mg daily prior to admission, now on aspirin 325  mg daily and plavix 75 DAPT for 3 months and then plavix alone given severe intracranial stenosis.  Therapy recommendations:  HH PT, HH SLP  Disposition:  Return home  Intracranial stenosis  MRA  Advanced atherosclerosis: severe R MCA bifurcation w/ M2 involvement, severe distal L ICA siphon and L MCA superior M2 branch origin, moderate R P2  Son reported BP low at home  Recommend home BP monitoring  Avoid low BP  BP goal long term 130-150  Seizure vs. Cerebral hypoperfusion with orthostatic hypotension  03/2019 admission with found down at home, AMS, right gaze, left weakness, tremulous convulsion. EEG no seizure, CT and MRI no stroke. CTA head and neck with b/l siphon high grade stenosis. Concerning for status epilepticus, and put on keppra. Discharged with 250mg  bid  Followed with Dr. Leta Baptist at [facility] - continued keppra   This admission again found down at home with tremulous activity with EMS.   EEG cortical dysfunction in right more than left temporal region, no seizure  Increased keppra to 500 bid  Continue follow up with Dr. Leta Baptist at [facility]  ?? Atrial Fibrillation  Diagnosed in Oct 2020 during last admission and quickly converted to NSR  Saw Dr/ Gwenlyn Found as an OP, not put on [facility] d/t fall risk  03/2019 EKG reported "Afib", but on my review does not consistent with afib but a lot of artifact  EKG this admission in NSR  Tele monitoring NSR this admission . Continue follow up with cardiology   Hypertension ? Orthostatic hypotension  Stable now  Son reported low BP at home  Orthostatic vital no significant hypotension  Avoid low BP . Put on  TED hose . Long-term BP goal 130-150  Hyperlipidemia  Home meds:  lipitor 40  LDL 60 in 02/2020, goal < 14  Hold off statin due to elevated AST/ALT  Consider to restart statin at follow up once LFT normalizes  Diabetes type II Uncontrolled  HgbA1c 7.9, goal < 7.0  CBGs  SSI  PCP follow up closely for better  DM control  Other Stroke Risk Factors  Advanced Age >/= 5   Former Cigarette smoker, quit 41 yrs ago  Family hx stroke (father)  Other Active Problems  Hx colon ca s/p resection [facility] day # 4  Neurology will sign off. Please call with questions. Pt will follow up with Dr. Leta Baptist at [facility] in about 4 weeks. Thanks for the consult.  Rosalin Hawking, MD PhD Stroke Neurology 06/03/2020 5:41 PM  I had long discussion with son at bedside, updated pt current condition, treatment plan and potential prognosis, and answered all the questions. He expressed understanding and appreciation.  I spent  35 minutes in total face-to-face time with the patient, more than 50% of which was spent in counseling and coordination of care, reviewing test results, images and medication, and discussing the diagnosis, treatment plan and potential prognosis. This patient's care requiresreview of multiple databases, neurological assessment, discussion with family, other specialists and medical decision making of high complexity.    To contact Stroke Continuity provider, please refer to [email or web address] After hours, contact General Neurology

## 2020-06-03 NOTE — Progress Notes (Signed)
  Echocardiogram 2D Echocardiogram has been performed.  Taylor Castaneda 06/03/2020, 11:59 AM

## 2020-06-04 DIAGNOSIS — N1831 Chronic kidney disease, stage 3a: Secondary | ICD-10-CM | POA: Diagnosis not present

## 2020-06-04 DIAGNOSIS — K8020 Calculus of gallbladder without cholecystitis without obstruction: Secondary | ICD-10-CM | POA: Diagnosis not present

## 2020-06-04 DIAGNOSIS — K831 Obstruction of bile duct: Secondary | ICD-10-CM | POA: Diagnosis not present

## 2020-06-04 LAB — CULTURE, BLOOD (ROUTINE X 2)
Culture: NO GROWTH
Culture: NO GROWTH

## 2020-06-04 LAB — CBC WITH DIFFERENTIAL/PLATELET
Abs Immature Granulocytes: 0.19 10*3/uL — ABNORMAL HIGH (ref 0.00–0.07)
Basophils Absolute: 0 10*3/uL (ref 0.0–0.1)
Basophils Relative: 0 %
Eosinophils Absolute: 0.3 10*3/uL (ref 0.0–0.5)
Eosinophils Relative: 3 %
HCT: 30.3 % — ABNORMAL LOW (ref 36.0–46.0)
Hemoglobin: 9.8 g/dL — ABNORMAL LOW (ref 12.0–15.0)
Immature Granulocytes: 2 %
Lymphocytes Relative: 19 %
Lymphs Abs: 2.2 10*3/uL (ref 0.7–4.0)
MCH: 31.9 pg (ref 26.0–34.0)
MCHC: 32.3 g/dL (ref 30.0–36.0)
MCV: 98.7 fL (ref 80.0–100.0)
Monocytes Absolute: 0.8 10*3/uL (ref 0.1–1.0)
Monocytes Relative: 7 %
Neutro Abs: 7.9 10*3/uL — ABNORMAL HIGH (ref 1.7–7.7)
Neutrophils Relative %: 69 %
Platelets: 123 10*3/uL — ABNORMAL LOW (ref 150–400)
RBC: 3.07 MIL/uL — ABNORMAL LOW (ref 3.87–5.11)
RDW: 13.2 % (ref 11.5–15.5)
WBC: 11.4 10*3/uL — ABNORMAL HIGH (ref 4.0–10.5)
nRBC: 0 % (ref 0.0–0.2)

## 2020-06-04 LAB — COMPREHENSIVE METABOLIC PANEL
ALT: 56 U/L — ABNORMAL HIGH (ref 0–44)
AST: 43 U/L — ABNORMAL HIGH (ref 15–41)
Albumin: 2.3 g/dL — ABNORMAL LOW (ref 3.5–5.0)
Alkaline Phosphatase: 200 U/L — ABNORMAL HIGH (ref 38–126)
Anion gap: 9 (ref 5–15)
BUN: 28 mg/dL — ABNORMAL HIGH (ref 8–23)
CO2: 19 mmol/L — ABNORMAL LOW (ref 22–32)
Calcium: 8.4 mg/dL — ABNORMAL LOW (ref 8.9–10.3)
Chloride: 114 mmol/L — ABNORMAL HIGH (ref 98–111)
Creatinine, Ser: 1.18 mg/dL — ABNORMAL HIGH (ref 0.44–1.00)
GFR, Estimated: 45 mL/min — ABNORMAL LOW (ref >60)
Glucose, Bld: 103 mg/dL — ABNORMAL HIGH (ref 70–99)
Potassium: 4 mmol/L (ref 3.5–5.1)
Sodium: 142 mmol/L (ref 135–145)
Total Bilirubin: 0.8 mg/dL (ref 0.3–1.2)
Total Protein: 5 g/dL — ABNORMAL LOW (ref 6.5–8.1)

## 2020-06-04 LAB — PHOSPHORUS: Phosphorus: 3 mg/dL (ref 2.5–4.6)

## 2020-06-04 LAB — GLUCOSE, CAPILLARY
Glucose-Capillary: 79 mg/dL (ref 70–99)
Glucose-Capillary: 149 mg/dL — ABNORMAL HIGH (ref 70–99)
Glucose-Capillary: 138 mg/dL — ABNORMAL HIGH (ref 70–99)
Glucose-Capillary: 211 mg/dL — ABNORMAL HIGH (ref 70–99)

## 2020-06-04 LAB — MAGNESIUM: Magnesium: 1.9 mg/dL (ref 1.7–2.4)

## 2020-06-04 MED ORDER — HYDRALAZINE HCL 20 MG/ML IJ SOLN
10.0000 mg | Freq: Four times a day (QID) | INTRAMUSCULAR | Status: DC | PRN
  Administered 2020-06-04 – 2020-06-09 (×2): 10 mg via INTRAVENOUS
  Filled 2020-06-04 (×3): qty 1

## 2020-06-04 NOTE — Progress Notes (Signed)
PROGRESS NOTE    Taylor Castaneda  RFX:[id number] DOB: [birth year] DOA: 05/30/2020 PCP: [name], MD   Brief Narrative:  HPI per Dr. Fuller Plan on 05/30/20 Taylor Castaneda is a 87 y.o. female with medical history significant of diabetes mellitus type 2, hyperlipidemia, seizure disorder, history colon cancer s/p resection, and anemia presented with complaints of passing out while at her condo.  The patient's daughter found her on the floor and was able to help her get to a chair.  However when EMS arrived patient had a witnessed generalized tonic-clonic seizure with urinary incontinence lasting approximately 1 minute prior to self resolving.  They did not have to give any abortive medications.  She had been compliant with her medications and last reported having a seizure over a year ago.  At baseline patient lives alone and handles all of her own medications.  Patient had not been feeling well earlier in the day with complaints of epigastric abdominal pain with radiation to her back.  Denied any recent trauma prior to onset of symptoms.  Symptoms lasted approximately 6 to 7 hours prior to self resolving.  She had taken Tums and Tylenol prior to coming into the hospital. CT scan of the head and cervical spine did not note any acute abnormalities.  CT imaging of the thoracic and lumbar spine noted severe spinal stenosis at L4-5 and large nonobstructing left renal stone.  Bowel sounds were noted to be stable.  Labs at the outside facility yesterday had been significant for WBC 17.8, hemoglobin 11.4, BUN 37, creatinine 1.4, alkaline phosphatase 283, AST 315, ALT 235, total bilirubin 3.3 and lactic acid initially 4->2.6.  COVID-19 and influenza screening was negative.  Right upper quadrant ultrasound revealed cholelithiasis without evidence of acute cholecystitis and cirrhosis.  Case had been discussed with GI here at [facility] who recommended MRCP which revealed cholelithiasis without signs of acute  cholecystitis, suspected small common bile duct stones distally with no duct dilatation, and pancreatic atrophy with dilatation in the mid body tail region with abrupt transition point concerning for possible stricture.  She had been given Keppra 1000 mg IV and 500 mL of normal saline IV fluids.  Patient had been accepted to the hospitalist service to a medical telemetry bed.  Repeat labs revealed WBC 25.6, hemoglobin 10.7, alkaline phosphatase 270, AST 219, ALT 207, and total bilirubin 3.7, lactic acid 2.2.  **Interim History  Patient underwent an ERCP and she had a periampullary diverticulum and she was noted to have choledocholithiasis which is status post biliary sphincterotomy and sphincteroplasty and balloon extraction.  She had cholelithiasis without cholecystitis and she had a mild gastritis.  Pancreatogram was not obtained and GI recommended watching for pancreatitis, bleeding or perforation and cholangitis.  Recommend a clear liquid diet and continue current medications and continue antibiotics for 5 days total and stopping the Flagyl.  He also recommended obtaining a surgical consultation while inpatient for questionable lap chole and following the results of the CA 19-9, CEA, IgG for.  Dr. Lyndel Safe feels that she will require an EUS as an outpatient for evaluation of pancreatic duct stricture.  After procedure she was sedated and very somnolent and drowsy and unable to be aroused given that she was asleep  General surgery was consulted on 05/31/2020 and after discussion with the patient they recommended a cholecystectomy which the patient was agreeable to.  Patient underwent a cholecystectomy on 06/01/2020 and is doing well postoperatively.  This morning she is slated to be  discharged however started having some word finding difficulties and could not articulate her words properly.  This was an acute change from her baseline and daughter was at bedside.  When I went to evaluate her if she was talking  normally but did also have some disarticulation of her words and after discussion with neurology a code stroke was called.  Neurology evaluated and symptoms had resolved but they felt she still needed further imaging so we are obtaining MRI of the brain and she was transferred to [address] for neuro checks closely.  MRI done and showed multiple punctate foci of acute ischemia within the right centrum semiovale with no hemorrhages or mass-effect seen.  Neurology recommended full stroke work-up and she ended up going for an MRA of the head without contrast, echocardiogram as well as Carotid Dopplers.  Neurology felt that she had an acute CVA versus subacute CVA and recommended aspirin and Plavix 325 mg and 75 mg p.o. respectively for 3 months and then just Plavix alone given her severe intracranial stenosis.  PT OT now recommending CIR so we have consulted to CIR for further evaluation and placement at inpatient rehab  06/04/2020 orthostatic vital signs were done and she did not meet orthostatic vital sign requirement but she did drop from lying to sitting to standing and were from 172/60 from lying and standing and went to 158/60. Currently awaiting CIR evaluation but she feels better today  Assessment & Plan:   Principal Problem:   Cholelithiasis Active Problems:   Hyperlipidemia associated with type 2 diabetes mellitus ([place])   CKD (chronic kidney disease), stage III (HCC)   Hyperbilirubinemia   Normocytic anemia   Transaminitis   Pancreatic duct dilated   Common bile duct (CBD) obstruction   Cerebral thrombosis with cerebral infarction   Cholelithiasis and choledocholithiasis status post ERCP on 05/31/2020 and now cholecystectomy postoperative day 2 Obstructive Jaundice with Hyperbilirubinemia and Abnormal LFTs, improving -Acute.  Patient present with complaints of abdominal pain with radiation to her back.   -Initial CT imaging studies significant for cholelithiasis without signs of cholecystitis  and cirrhosis.   -GI has been initially consulted and MRCP was recommended.  MRCP revealed cholelithiasis without cholecystitis, small gallstones distally without clear duct dilatation, pancreatic duct dilatation in the mid body region with an abrupt transition, but no obvious obstructing lesion.   -Patient transferred for need of ERCP and this was done today  -Admit to a medical telemetry -Check blood cultures and showed NGTD at 3 days -Diet had been advanced but she was made n.p.o. when she started having difficulty with her speech and word finding -Start empiric antibiotics of Rocephin and metronidazole; GI recommending stopping Flagyl  -Follow up on CA19-9 was less than 2, CEA was 3.2, IgG4 was 49 -[facility] consult, will follow-up for further recommendations -Underwent ERCP today and she had a periampullary diverticulum and she was noted to have choledocholithiasis which is status post biliary sphincterotomy and sphincteroplasty and balloon extraction.  She had cholelithiasis without cholecystitis and she had a mild gastritis.   -Pancreatogram was not obtained and GI recommended watching for pancreatitis, bleeding or perforation and cholangitis.   -Dr. Lyndel Safe Recommend a clear liquid diet and continue current medications and continue antibiotics for 5 days total and stopping the Flagyl. He also recommended obtaining a surgical consultation while inpatient for questionable lap chole and following the results of the CA 19-9, CEA, IgG4. -T Bili went from 3.7 and its peak is now 0.8 -General Surgery  consulted for further evaluation and they they discussed the possibility of cholecystectomy with the patient.  She is agreeable to surgical intervention and decided to proceed with cholecystectomy and this was done on 06/01/2020 -Will need PT/OT to evaluate and Treat to determine safe discharge disposition and this can be done after her surgery -Surgery has evaluated patient and cleared her for discharge  and PT OT recommending home health PT and OT initially but now recommending CIR and will consult CIR for assistance with placement  Expressive Aphasia in the setting of acute versus subacute CVA the setting of large vessel disease -Patient had significant difficulty time finding her words on 05/07/1999 and when she did she repeat herself and kept saying "free" -When I went to evaluate her she was improving but still had some difficulty articulating her words -Spoke with neurology who recommended calling a code stroke and a stat head CT was ordered but when she was evaluated by neurology her symptoms resolved so code stroke was canceled and MRI was ordered -patient was transferred to [address] for continuing neurochecks every 2 hours next-continue monitor carefully and follow-up with neurology recommendations and follow-up on MRI -MRI was done and showed "Multiple punctate foci of acute ischemia within the right centrum semiovale. No hemorrhage or mass effect." -MRA of the Head w/o Contrast was recommended and showed "Advanced intracranial atherosclerosis. No large vessel occlusion identified, but  Severe stenosis at the Right MCA bifurcation, with long segment involvement of the superior M2. Severe stenosis distal Left ICA siphon and Left MCA superior M2 branch origin. Moderate stenosis Right PCA P2 segment." -Carotid Dopplers done and showed "Velocities in the right ICA are consistent with a 1-39% stenosis.   Left Carotid: Velocities in the left ICA are consistent with a 1-39% stenosis." -ECHOCardiogram done and showed EF of 60 to 65% with grade 2 status function noted within right ventricular systolic function with mildly reduced with no mention of thrombus noted -EEG will be ordered to rule out Seizures showed that the study was suggestive of cortical dysfunction in the right more than left temporal region with nonspecific etiology with no seizures or epileptiform discharges seen throughout the  recording -Surgery evaluated and felt that her speech is worse when she stands up; Will continue to observe and get Orthostatics; Will need TED Hose; Repeat Orthostatic done and she was not orthostatic today and TED hose were ordered -SLP evaluated and reccommending Home Health SLP -Neurology recommending ASA 325 mg po Daily and Plavix 75 mg po Daily for 3 months then Plavix along given her sever Intracranial Stenosis -Statin Held due to Abnormal LFTs and resume in the outpatient setting with Dr. Leta Baptist -PT/OT now recommending CIR so will consult Rehab for Screening and MD for Evaluation to CIR -Follow-up on neurology recommendations  Pancreatic Duct Dilatation  -As seen above.  Lipase was limits and no significant signs of inflammation noted on imaging. -CT of the abdomen with and without contrast -Further Care Per GI -Dr. Lyndel Safe feels that she will require an EUS as an outpatient for evaluation of pancreatic duct stricture as she did not have a Pancreatogram done here  Leukocytosis and lactic acidosis, improving -Acute.  -WBC elevated from 17.8 to 25.6 with lactic acid initially elevated up to 4, but slowly trending down.  -Patient vital signs were noted to be relatively within normal limits not necessarily meeting sepsis criteria. -WBC is now 11.4 -LA is trending down and LA was last 2.4 -Follow-up blood cultures and pending  -  Check urinalysis and culture if necessary -She is on Abx and on IV Ceftriaxone 2 g q24h -IV fluid hydration is now -Trend lactic acid levels  Seizure Disorder -Patient had witnessed generalized tonic-clonic seizure lasting 1 minute with continence of urine yesterday.   -Suspect this could have been the reason patient had fallen on the floor as well.   -Home medications include Keppra 250 mg twice daily. Patient had received 1000 mg IV while hospitalized. -Seizure precaution  -Continue home regimen of Keppra for now and Neurology has increased Keppra to 500  mg po BID -She evaluated and she follows up with Dr. Andrey Spearman and [facility] and because of this admission she is found at home with tremorous activity and EEG showed cortical dysfunction.  Neurology thinks that she could have had some cerebral hypoperfusion with orthostatic hypotension so we will recheck orthostatics in the morning -May need to get an EEG and discussion with neurology.  We will order an EEG given her difficulty finding her words and articulating them this morning concern for a CVA -MRI of the brain was obtained and above  Essential Hypertension but now with Orthostatic Hypotension -Home blood pressure medications include Altace 5 mg daily, Coreg 12.5 mg twice daily, and furosemide 20 mg daily. -Continue to Hold furosemide but resumed Ramipril 5 mg po Daily -Continue Carvedilol 12.5 mg po BID -Continue to Monitor BP per Protocol  -Repeat Orthostatic VS pending  -Last blood pressure was 174/60  HX of A Fib -Currently in NSR -Not on Anticoagulation due to Fall risk -Follow up with Cardiology as an outpateint and Neurology does not feel she has had an embolic stroke  Acute kidney injury superimposed on chronic kidney disease IIIb Metabolic Acidosis -Creatinine was initially noted to be 1.4 (around patient's baseline), but repeat check today 1.76 with BUN 34 on admission; Now BUN/Cr stable at 36/1.67 yesterday and today is much improved to 28/1.18 -Check CK and was 75 -Patient has a small acidosis now with a CO2 of 19, chloride level 114, and anion gap of 9 -IV fluid hydration is now stopped -Avoid nephrotoxic medications, contrast dyes, hypotension and renally dose medications -Repeat CMP in a.m.  Normocytic Anemia -Acute.     -Hemoglobin/hematocrit is relatively stable at 10.9/32.1 yesterday and is now 9.4/29.1 -Check anemia panel and showed an iron level of 50, TIBC 105, TIBC 155, saturation ratio 32%, ferritin level of 393, folate of 21.4, and vitamin B12 of  588 -Continue to monitor for signs and symptoms of bleeding; currently no overt bleeding noted -Repeat CBC in a.m.  Diabetes mellitus type 2, uncontrolled -On admission glucose elevated up to 205.  Last hemoglobin A1c noted to be 8.7 on 02/12/2020.  Home medication includes Lantus 16 units nightly. -Hypoglycemic protocol -Check hemoglobin A1c was 7.9 -Continue half dose of Lantus -CBGs before every meal with moderate SSI  -Diabetes education coronary is following closely -CBGs ranging from 79-1 49  Abnormal LFTs and Hyperbilirubinemia, slowly improving -Acute.  Patient found to have AST 315->219 -> 86 -> 70 -> 70 -> 53 and today is 43, ALT 235->207 -> 119 -> 93 -> 89, and total bilirubin 3.3->3.7 -> 3.1 -> 3.0 -> 1.4 -> 69 and today is 56. -Continue to monitor and trend Hepatic Fxn Panel -Repeat CMP in the AM  Normocytic Anemia -Patient's hemoglobin/hematocrit has gone from 10.9/32.1 -> 9.4/29.1 -> 9.8/30.3 -Check Anemia Panel in the AM  -Continue to Monitor for S/Sx of Bleeding; Currently no overt bleeding noted -Repeat CBC  in the AM   Thrombocytopenia -Patient's Platelet count has gone from 113 -> 124 -> 119 -> 123 -Continue to Monitor and Trend and Continue to Monitor for S/Sx of Bleeding -Repeat CBC in the AM   Elevated Alk Phos -Was elevated to 282 on admission is trended down and is now 192 yesterday and today is 200 -Continue to monitor and trend repeat CMP in a.m.  Hyperlipidemia -Hold Atorvastatin due to elevated liver enzymes and resume once they are normalized  DVT prophylaxis: SCDs Code Status: FULL CODE Family Communication: Spoke with daughter at the bedside Disposition Plan: Pending further evaluation by PT OT, clearance by gastroenterology as well as general surgery.  Now that she had some neurological issues will transfer to [address] and obtain further head imaging and discussed with neurology.  She will need neurology clearance prior to safe discharge  disposition and likely will go home with home health  Status is: Inpatient  Remains inpatient appropriate because:Unsafe d/c plan, IV treatments appropriate due to intensity of illness or inability to take PO and Inpatient level of care appropriate due to severity of illness   Dispo: The patient is from: Home              Anticipated d/c is to: SNF              Anticipated d/c date is: 2 days              Patient currently is not medically stable to d/c.  Consultants:   Gastroenterology  General Surgery    Procedures:  ERCP Findings:      The scout film was normal. The upper GI tract was traversed under direct       vision without detailed examination. Mild antral gastritis was noted.      The major papilla was located at lower lip of periampullary diverticula.       The bile duct was deeply cannulated with the short-nosed traction       sphincterotome. Contrast was injected. I personally interpreted the bile       duct images. There was brisk flow of contrast through the ducts. Image       quality was adequate. Contrast extended to the entire biliary tree. 3       filling defects c/w choledocholithiasis was found in CBD which was       mildly dilated to 10 mm. Low insertion of cystic duct was noted. Cystic       duct did fill. Multiple filling defects in the gallbladder consistent       with cholelithiasis.      A 6 mm biliary sphincterotomy was made with a monofilament traction       (standard) sphincterotome using ERBE electrocautery at 12 o'clock       position. There was no post-sphincterotomy bleeding. Due to       periampullary diverticula, we elected to proceed with sphincteroplasty.       Sphincteroplasty was performed using 03-15-11 mm x 5.5 cm CRE balloon to       a maximum balloon size of 10 mm. The biliary tree was swept multiple       times with a 12 mm balloon starting at the bifurcation. All 3 stones       were removed. Nothing was found on postocclusion  cholangiogram. The bile       was green. There was no pus.      Pancreatogram was not obtained.  We did try few times but the wire would       not find pancreatic duct after the sphincterotomy.      Mild antral gastritis was biopsied with a cold forceps for histology to       r/o HP. Impression:               -Periampullary diverticulum.                           -Choledocholithiasis s/p biliary sphincterotomy,                            sphincteroplasty and balloon extraction.                           -Cholelithiasis without cholecystitis.                           -Mild gastritis.                           -Pancreatogram not obtained. Recommendation:           - Watch for pancreatitis, bleeding, perforation,                            and cholangitis.                           - Clear liquid diet.                           - Continue present medications.                           - Continue antibiotics x total of 5 days. Can stop                            Flagyl.                           - Can obtain surgical consultation while inpatient                            ?Lap chole.                           - Follow results of CA 19?9/CEA/IgG4. I do believe                            she will require EUS as an outpatient for                            evaluation of PD stricture. I will run it by Dr.                            Wendie Agreste. Mansouraty                           -  The findings and recommendations were discussed                            with the patient's family.  Laparoscopic cholecystectomy was performed by Dr. Romana Juniper on 06/01/2020  Antimicrobials: Anti-infectives (From admission, onward)   Start     Dose/Rate Route Frequency Ordered Stop   05/30/20 1615  cefTRIAXone (ROCEPHIN) 2 g in sodium chloride 0.9 % 100 mL IVPB        2 g 200 mL/hr over 30 Minutes Intravenous Every 24 hours 05/30/20 1516     05/30/20 1615  metroNIDAZOLE (FLAGYL) tablet 500 mg  Status:   Discontinued        500 mg Oral Every 8 hours 05/30/20 1516 05/31/20 1611        Subjective: Seen And examined at bedside and was feeling well.  Sitting in the chair and denied any complaints or nausea or vomiting.  Agreeable to going to CIR.  Denies any lightheadedness or dizziness.  No other concerns or complaints at this time.  Objective: Vitals:   06/04/20 0425 06/04/20 0824 06/04/20 1127 06/04/20 1552  BP: (!) 169/64 (!) 142/81 (!) 192/66 (!) 174/60  Pulse: (!) 53 64 63 60  Resp: (!) _0 Temp: 97.8 F (36.6 C) 98.6 F (37 C) 97.8 F (36.6 C) 97.7 F (36.5 C)  TempSrc: Oral Oral Oral Oral  SpO2: 100% 100% 100% 100%  Weight:      Height:        Intake/Output Summary (Last 24 hours) at 06/04/2020 1743 Last data filed at 06/04/2020 1300 Gross per 24 hour  Intake 823 ml  Output --  Net 823 ml   Filed Weights   05/30/20 1645 05/31/20 0912 06/01/20 1045  Weight: 50.8 kg 50.8 kg 50.8 kg   Examination: Physical Exam:  Constitutional: WN/WD elderly Caucasian female currently no acute distress and appears comfortable sitting in the chair Eyes: Lids and conjunctivae normal, sclerae anicteric  ENMT: External Ears, Nose appear normal. Grossly normal hearing.  Neck: Appears normal, supple, no cervical masses, normal ROM, no appreciable thyromegaly; no JVD Respiratory: Diminished to auscultation bilaterally, no wheezing, rales, rhonchi or crackles. Normal respiratory effort and patient is not tachypenic. No accessory muscle use.  Unlabored breathing Cardiovascular: RRR, no murmurs / rubs / gallops. S1 and S2 auscultated.  Minimal extremity edema Abdomen: Soft, non-tender, non-distended.  Bowel sounds positive.  GU: Deferred. Musculoskeletal: No clubbing / cyanosis of digits/nails. No joint deformity upper and lower extremities.  Skin: No rashes, lesions, ulcers on to skin evaluation. No induration; Warm and dry.  Neurologic: CN 2-12 grossly intact with no focal deficits.  Romberg sign and cerebellar reflexes not assessed.  Psychiatric: Normal judgment and insight. Alert and oriented x 3. Normal mood and appropriate affect.   Data Reviewed: I have personally reviewed following labs and imaging studies  CBC: Recent Labs  Lab 05/29/20 1932 05/30/20 0139 05/31/20 0043 06/01/20 0035 06/02/20 0022 06/03/20 0330 06/04/20 0213  WBC 17.8* 25.6* 12.2* 13.2* 12.1* 12.0* 11.4*  NEUTROABS 16.3* 23.7*  --  11.6*  --  8.4* 7.9*  HGB 11.4* 10.7* 9.0* 10.0* 10.9* 9.4* 9.8*  HCT 35.0* 31.4* 27.5* 29.8* 32.1* 29.1* 30.3*  MCV 98.3 95.7 97.2 98.3 96.7 99.7 98.7  PLT 156 147* 121* 113* 124* 119* 017*   Basic Metabolic Panel: Recent Labs  Lab 05/31/20 0043 06/01/20 0035 06/02/20 0022 06/03/20 0330 06/04/20 4944  NA 141 142 140 142 142  K 4.0 4.8 4.8 4.2 4.0  CL 107 112* 111 114* 114*  CO2 25 18* 18* 19* 19*  GLUCOSE 193* 255* 243* 127* 103*  BUN 31* 33* 37* 36* 28*  CREATININE 1.68* 1.65* 1.64* 1.67* 1.18*  CALCIUM 8.2* 7.8* 7.6* 7.9* 8.4*  MG  --  1.8 1.9 1.9 1.9  PHOS  --  4.3  --  2.6 3.0   GFR: Estimated Creatinine Clearance: 23.8 mL/min (A) (by C-G formula based on SCr of 1.18 mg/dL (H)). Liver Function Tests: Recent Labs  Lab 05/31/20 0043 06/01/20 0035 06/02/20 0022 06/03/20 0330 06/04/20 0213  AST 86* 70* 70* 53* 43*  ALT 119* 93* 89* 69* 56*  ALKPHOS 208* 235* 242* 192* 200*  BILITOT 3.1* 3.0* 1.4* 0.9 0.8  PROT 5.1* 5.2* 5.7* 4.9* 5.0*  ALBUMIN 2.4* 2.3* 2.4* 2.3* 2.3*   Recent Labs  Lab 05/29/20 2230  LIPASE 25   No results for input(s): AMMONIA in the last 168 hours. Coagulation Profile: No results for input(s): INR, PROTIME in the last 168 hours. Cardiac Enzymes: Recent Labs  Lab 05/30/20 1717  CKTOTAL 75   BNP (last 3 results) No results for input(s): PROBNP in the last 8760 hours. HbA1C: Recent Labs    06/02/20 2116  HGBA1C 7.9*   CBG: Recent Labs  Lab 06/03/20 1639 06/03/20 2110 06/04/20 0600  06/04/20 1130 06/04/20 1638  GLUCAP 85 109* 79 149* 138*   Lipid Profile: No results for input(s): CHOL, HDL, LDLCALC, TRIG, CHOLHDL, LDLDIRECT in the last 72 hours. Thyroid Function Tests: Recent Labs    06/02/20 1430  TSH 1.712   Anemia Panel: Recent Labs    06/02/20 1430  VITAMINB12 550   Sepsis Labs: Recent Labs  Lab 05/29/20 2135 05/30/20 0139 05/30/20 1516 05/30/20 1655  LATICACIDVEN 2.6* 2.2* 1.9 2.4*    Recent Results (from the past 240 hour(s))  Resp Panel by RT-PCR (Flu A&B, Covid) Nasopharyngeal Swab     Status: None   Collection Time: 05/29/20  9:49 PM   Specimen: Nasopharyngeal Swab; Nasopharyngeal(NP) swabs in vial transport medium  Result Value Ref Range Status   SARS Coronavirus 2 by RT PCR NEGATIVE NEGATIVE Final    Comment: (NOTE) SARS-CoV-2 target nucleic acids are NOT DETECTED.  The SARS-CoV-2 RNA is generally detectable in upper respiratory specimens during the acute phase of infection. The lowest concentration of SARS-CoV-2 viral copies this assay can detect is 138 copies/mL. A negative result does not preclude SARS-Cov-2 infection and should not be used as the sole basis for treatment or other patient management decisions. A negative result may occur with  improper specimen collection/handling, submission of specimen other than nasopharyngeal swab, presence of viral mutation(s) within the areas targeted by this assay, and inadequate number of viral copies(<138 copies/mL). A negative result must be combined with clinical observations, patient history, and epidemiological information. The expected result is Negative.  Fact Sheet for Patients:  EntrepreneurPulse.com.au  Fact Sheet for Healthcare Providers:  IncredibleEmployment.be  This test is no t yet approved or cleared by the [place] FDA and  has been authorized for detection and/or diagnosis of SARS-CoV-2 by FDA under an Emergency Use  Authorization (EUA). This EUA will remain  in effect (meaning this test can be used) for the duration of the COVID-19 declaration under Section 564(b)(1) of the Act, 21 U.S.C.section 360bbb-3(b)(1), unless the authorization is terminated  or revoked sooner.       Influenza A by  PCR NEGATIVE NEGATIVE Final   Influenza B by PCR NEGATIVE NEGATIVE Final    Comment: (NOTE) The Xpert Xpress SARS-CoV-2/FLU/RSV plus assay is intended as an aid in the diagnosis of influenza from Nasopharyngeal swab specimens and should not be used as a sole basis for treatment. Nasal washings and aspirates are unacceptable for Xpert Xpress SARS-CoV-2/FLU/RSV testing.  Fact Sheet for Patients: EntrepreneurPulse.com.au  Fact Sheet for Healthcare Providers: IncredibleEmployment.be  This test is not yet approved or cleared by the [place] FDA and has been authorized for detection and/or diagnosis of SARS-CoV-2 by FDA under an Emergency Use Authorization (EUA). This EUA will remain in effect (meaning this test can be used) for the duration of the COVID-19 declaration under Section 564(b)(1) of the Act, 21 U.S.C. section 360bbb-3(b)(1), unless the authorization is terminated or revoked.  Performed at [facility], [address] [place], [place] [address]   Culture, blood (x 2)     Status: None   Collection Time: 05/30/20  5:00 PM   Specimen: BLOOD RIGHT ARM  Result Value Ref Range Status   Specimen Description BLOOD RIGHT ARM  Final   Special Requests   Final    BOTTLES DRAWN AEROBIC ONLY Blood Culture results may not be optimal due to an inadequate volume of blood received in culture bottles   Culture   Final    NO GROWTH 5 DAYS Performed at [facility], [place] [address] [place], [place] [address]    Report Status 06/04/2020 FINAL  Final  Culture, blood (x 2)     Status: None   Collection Time: 05/30/20  5:03 PM   Specimen: BLOOD RIGHT ARM   Result Value Ref Range Status   Specimen Description BLOOD RIGHT ARM  Final   Special Requests   Final    BOTTLES DRAWN AEROBIC ONLY Blood Culture results may not be optimal due to an inadequate volume of blood received in culture bottles   Culture   Final    NO GROWTH 5 DAYS Performed at [facility], [place] [address], [place], [place] [address]    Report Status 06/04/2020 FINAL  Final  Surgical pcr screen     Status: None   Collection Time: 05/31/20  8:24 PM   Specimen: Nasal Mucosa; Nasal Swab  Result Value Ref Range Status   MRSA, PCR NEGATIVE NEGATIVE Final   Staphylococcus aureus NEGATIVE NEGATIVE Final    Comment: (NOTE) The Xpert SA Assay (FDA approved for NASAL specimens in patients 106 years of age and older), is one component of a comprehensive surveillance program. It is not intended to diagnose infection nor to guide or monitor treatment. Performed at [facility], [place] [address] [place], [place] [address]      RN Pressure Injury Documentation:     Estimated body mass index is 22.62 kg/m as calculated from the following:   Height as of this encounter: _0  (1.499 m).   Weight as of this encounter: 50.8 kg.  Malnutrition Type:  Nutrition Problem: Inadequate oral intake Etiology: altered GI function   Malnutrition Characteristics:  Signs/Symptoms: NPO status   Nutrition Interventions:  Interventions: Refer to RD note for recommendations   Radiology Studies: MR ANGIO HEAD WO CONTRAST  Result Date: 06/03/2020 CLINICAL DATA:  87 year old female with transient aphasia and punctate foci of acute ischemia in the right hemisphere white matter on MRI this morning. EXAM: MRA HEAD WITHOUT CONTRAST TECHNIQUE: Angiographic images of the Circle of Willis were  obtained using MRA technique without intravenous contrast. COMPARISON:  Brain MRI 0414 hours today. CTA head and neck 03/05/2019. FINDINGS: And no intracranial mass effect or ventriculomegaly.  Antegrade flow in the posterior circulation with codominant patent distal vertebral arteries. No distal vertebral or vertebrobasilar junction stenosis. Patent right PICA origin, left AICA which may be dominant. Patent basilar artery although diminutive distally with mild to moderate mid basilar stenosis on series 1055, image 10. The basilar artery functionally terminates in SCAs. Fetal type bilateral PCA origins. Both SCA is are patent. Bilateral PCAs are patent, with mild irregularity on the left and mild to moderate right PCA P2 irregularity and stenosis. Antegrade flow in both ICA siphons. On the right there is mild siphon irregularity and stenosis. But there is severe left siphon supraclinoid stenosis as demonstrated on series 1045, image 16. Posterior communicating artery origins are normal. Patent bilateral carotid termini. Patent MCA and ACA origins. Diminutive or absent anterior communicating artery. Bilateral visible ACA branches are within normal limits. Left MCA M1 segment bifurcates early without stenosis but there is severe stenosis at the origin of a superior left MCA M2 branch on series 1045 image 13. Other left MCA branches are within normal limits. Right MCA M1 is patent but there is severe stenosis at the right MCA bifurcation also affecting the proximal superior M2 branch, as well as moderate stenosis at the origin of the inferior M2 branch (series 1045, image 10). No complete occlusion of a right MCA branch identified. IMPRESSION: 1. Advanced intracranial atherosclerosis. No large vessel occlusion identified, but: - Severe stenosis at the Right MCA bifurcation, with long segment involvement of the superior M2. - Severe stenosis distal Left ICA siphon and Left MCA superior M2 branch origin. 2. Moderate stenosis Right PCA P2 segment. Electronically Signed   By: Genevie Ann M.D.   On: 06/03/2020 11:33   MR BRAIN WO CONTRAST  Result Date: 06/03/2020 CLINICAL DATA:  Transient expressive aphasia EXAM:  MRI HEAD WITHOUT CONTRAST TECHNIQUE: Multiplanar, multiecho pulse sequences of the brain and surrounding structures were obtained without intravenous contrast. COMPARISON:  03/06/2019 FINDINGS: Brain: There are multiple punctate foci of abnormal diffusion restriction within the right centrum semiovale. No contralateral diffusion abnormality. No acute or chronic hemorrhage. There is multifocal hyperintense T2-weighted signal within the white matter. Generalized volume loss without a clear lobar predilection. The midline structures are normal. Vascular: Major flow voids are preserved. Skull and upper cervical spine: Normal calvarium and skull base. Visualized upper cervical spine and soft tissues are normal. Sinuses/Orbits:No paranasal sinus fluid levels or advanced mucosal thickening. No mastoid or middle ear effusion. Normal orbits. IMPRESSION: Multiple punctate foci of acute ischemia within the right centrum semiovale. No hemorrhage or mass effect. Electronically Signed   By: Ulyses Jarred M.D.   On: 06/03/2020 04:50   EEG adult  Result Date: 06/02/2020 Lora Havens, MD     06/02/2020 11:09 PM Patient Name: Taylor Castaneda MRN: [id number] Epilepsy Attending: Lora Havens Referring Physician/Provider: Dr Carlisle Cater Date: 06/02/2020 Duration: 23.50 mins Patient history:  88yo F with acute transient episodes of expressive aphasia today. EEG to evaluate for siezure Level of alertness: Awake, asleep AEDs during EEG study: LEV Technical aspects: This EEG study was done with scalp electrodes positioned according to the 10-20 International system of electrode placement. Electrical activity was acquired at a sampling rate of _0  and reviewed with a high frequency filter of _1  and a low frequency filter of _2 . EEG data were recorded  continuously and digitally stored. Description: The posterior dominant rhythm consists of 7.5 Hz activity of moderate voltage (25-35 uV) seen predominantly in posterior  head regions, symmetric and reactive to eye opening and eye closing. Sleep was characterized by vertex waves, sleep spindles (12 to 14 Hz), maximal frontocentral region.  EEG showed intermittent right>left temporal 2-_0  delta slowing.  Hyperventilation and photic stimulation were not performed.   ABNORMALITY -Intermittent slow, right>left temporal region IMPRESSION: This study is suggestive of cortical dysfunction in right more than left temporal region, non specific etiology. No seizures or epileptiform discharges were seen throughout the recording. Lora Havens   ECHOCARDIOGRAM COMPLETE  Result Date: 06/03/2020    ECHOCARDIOGRAM REPORT   Patient Name:   Taylor Castaneda Date of Exam: 06/03/2020 Medical Rec #:  [id number]      Height:       59.0 in Accession #:    [id number]     Weight:       112.0 lb Date of Birth:  [birth year]      BSA:          1.442 m Patient Age:    74 years       BP:           157/67 mmHg Patient Gender: F              HR:           61 bpm. Exam Location:  Inpatient Procedure: 2D Echo, Cardiac Doppler and Color Doppler Indications:    Stroke  History:        Patient has prior history of Echocardiogram examinations, most                 recent 03/06/2019. Abnormal ECG, Stroke, Arrythmias:Atrial                 Fibrillation, Signs/Symptoms:Dyspnea; Risk Factors:Hypertension                 and Diabetes.  Sonographer:    Roseanna Rainbow RDCS Referring Phys: [id number] [facility]  Sonographer Comments: Technically difficult study due to poor echo windows. Image acquisition challenging due to patient body habitus. IMPRESSIONS  1. Left ventricular ejection fraction, by estimation, is 60 to 65%. The left ventricle has normal function. The left ventricle has no regional wall motion abnormalities. Left ventricular diastolic parameters are consistent with Grade II diastolic dysfunction (pseudonormalization).  2. Right ventricular systolic function is mildly reduced. The right ventricular size is  normal. There is severely elevated pulmonary artery systolic pressure. The estimated right ventricular systolic pressure is 58.8 mmHg.  3. The mitral valve is normal in structure. Mild mitral valve regurgitation. No evidence of mitral stenosis. Moderate mitral annular calcification.  4. The aortic valve is tricuspid. Aortic valve regurgitation is not visualized. Mild aortic valve sclerosis is present, with no evidence of aortic valve stenosis.  5. The inferior vena cava is normal in size with <50% respiratory variability, suggesting right atrial pressure of 8 mmHg.  6. There is a pleural effusion. FINDINGS  Left Ventricle: Left ventricular ejection fraction, by estimation, is 60 to 65%. The left ventricle has normal function. The left ventricle has no regional wall motion abnormalities. The left ventricular internal cavity size was normal in size. There is  no left ventricular hypertrophy. Left ventricular diastolic parameters are consistent with Grade II diastolic dysfunction (pseudonormalization). Right Ventricle: The right ventricular size is normal. No increase in right ventricular wall thickness. Right ventricular systolic function  is mildly reduced. There is severely elevated pulmonary artery systolic pressure. The tricuspid regurgitant velocity is 3.63 m/s, and with an assumed right atrial pressure of 8 mmHg, the estimated right ventricular systolic pressure is 08.6 mmHg. Left Atrium: Left atrial size was normal in size. Right Atrium: Right atrial size was normal in size. Pericardium: There is a pleural effusion. There is no evidence of pericardial effusion. Mitral Valve: The mitral valve is normal in structure. There is mild calcification of the mitral valve leaflet(s). Moderate mitral annular calcification. Mild mitral valve regurgitation. No evidence of mitral valve stenosis. MV peak gradient, 8.4 mmHg. The mean mitral valve gradient is 2.0 mmHg. Tricuspid Valve: The tricuspid valve is normal in structure.  Tricuspid valve regurgitation is trivial. Aortic Valve: The aortic valve is tricuspid. Aortic valve regurgitation is not visualized. Mild aortic valve sclerosis is present, with no evidence of aortic valve stenosis. Pulmonic Valve: The pulmonic valve was normal in structure. Pulmonic valve regurgitation is not visualized. Aorta: The aortic root is normal in size and structure. Venous: The inferior vena cava is normal in size with less than 50% respiratory variability, suggesting right atrial pressure of 8 mmHg. IAS/Shunts: No atrial level shunt detected by color flow Doppler.  LEFT VENTRICLE PLAX 2D LVIDd:         3.15 cm     Diastology LVIDs:         2.00 cm     LV e' medial:    3.57 cm/s LV PW:         1.05 cm     LV E/e' medial:  19.8 LV IVS:        1.00 cm     LV e' lateral:   2.76 cm/s LVOT diam:     1.50 cm     LV E/e' lateral: 25.6 LV SV:         41 LV SV Index:   29 LVOT Area:     1.77 cm  LV Volumes (MOD) LV vol d, MOD A2C: 34.0 ml LV vol d, MOD A4C: 40.7 ml LV vol s, MOD A2C: 15.3 ml LV vol s, MOD A4C: 10.5 ml LV SV MOD A2C:     18.7 ml LV SV MOD A4C:     40.7 ml LV SV MOD BP:      24.1 ml RIGHT VENTRICLE            IVC RV S prime:     5.77 cm/s  IVC diam: 1.80 cm TAPSE (M-mode): 1.2 cm LEFT ATRIUM             Index       RIGHT ATRIUM           Index LA diam:        3.30 cm 2.29 cm/m  RA Area:     10.30 cm LA Vol (A2C):   15.9 ml 11.03 ml/m RA Volume:   19.80 ml  13.74 ml/m LA Vol (A4C):   19.1 ml 13.25 ml/m LA Biplane Vol: 17.7 ml 12.28 ml/m  AORTIC VALVE LVOT Vmax:   101.00 cm/s LVOT Vmean:  64.900 cm/s LVOT VTI:    0.234 m  AORTA Ao Root diam: 2.30 cm MITRAL VALVE               TRICUSPID VALVE MV Area (PHT): 4.15 cm    TR Peak grad:   52.7 mmHg MV Peak grad:  8.4 mmHg    TR Vmax:  363.00 cm/s MV Mean grad:  2.0 mmHg MV Vmax:       1.45 m/s    SHUNTS MV Vmean:      56.3 cm/s   Systemic VTI:  0.23 m MV Decel Time: 183 msec    Systemic Diam: 1.50 cm MV E velocity: 70.78 cm/s Loralie Champagne MD  Electronically signed by Loralie Champagne MD Signature Date/Time: 06/03/2020/12:42:02 PM    Final    VAS US CAROTID  Result Date: 06/03/2020 Carotid Arterial Duplex Study Indications:  Speech disturbance. Risk Factors: Hyperlipidemia, Diabetes. Performing Technologist: Maudry Mayhew MHA, RDMS, RVT, RDCS  Examination Guidelines: A complete evaluation includes B-mode imaging, spectral Doppler, color Doppler, and power Doppler as needed of all accessible portions of each vessel. Bilateral testing is considered an integral part of a complete examination. Limited examinations for reoccurring indications may be performed as noted.  Right Carotid Findings: +----------+-------+-------+--------+---------------------------------+--------+           PSV    EDV    StenosisPlaque Description               Comments           cm/s   cm/s                                                     +----------+-------+-------+--------+---------------------------------+--------+ CCA Prox  55     7                                                        +----------+-------+-------+--------+---------------------------------+--------+ CCA Distal62     9              smooth and heterogenous                   +----------+-------+-------+--------+---------------------------------+--------+ ICA Prox  54     16             smooth and heterogenous                   +----------+-------+-------+--------+---------------------------------+--------+ ICA Distal73     20                                                       +----------+-------+-------+--------+---------------------------------+--------+ ECA       68                    heterogenous, irregular and                                               calcific                                  +----------+-------+-------+--------+---------------------------------+--------+ +----------+--------+-------+----------------+-------------------+            PSV cm/sEDV cmsDescribe  Arm Pressure (mmHG) +----------+--------+-------+----------------+-------------------+ [id number]             Multiphasic, WNL                    +----------+--------+-------+----------------+-------------------+ +---------+--------+--+--------+-+---------+ VertebralPSV cm/s36EDV cm/s7Antegrade +---------+--------+--+--------+-+---------+  Left Carotid Findings: +----------+--------+--------+--------+---------------------+------------------+           PSV cm/sEDV cm/sStenosisPlaque Description   Comments           +----------+--------+--------+--------+---------------------+------------------+ CCA Prox  58      9                                    intimal thickening +----------+--------+--------+--------+---------------------+------------------+ CCA Distal70      13              smooth and                                                                heterogenous                            +----------+--------+--------+--------+---------------------+------------------+ ICA Prox  71      14              heterogenous and                                                          irregular                               +----------+--------+--------+--------+---------------------+------------------+ ICA Distal80      12                                                      +----------+--------+--------+--------+---------------------+------------------+ ECA       87                      smooth, heterogenous                                                      and calcific                            +----------+--------+--------+--------+---------------------+------------------+ +----------+--------+--------+----------------+-------------------+           PSV cm/sEDV cm/sDescribe        Arm Pressure (mmHG) +----------+--------+--------+----------------+-------------------+ [id number]               Multiphasic, WNL                    +----------+--------+--------+----------------+-------------------+ +---------+--------+--+--------+-+---------+ VertebralPSV cm/s37EDV cm/s6Antegrade +---------+--------+--+--------+-+---------+   Summary:  Right Carotid: Velocities in the right ICA are consistent with a 1-39% stenosis. Left Carotid: Velocities in the left ICA are consistent with a 1-39% stenosis. Vertebrals:  Bilateral vertebral arteries demonstrate antegrade flow. Subclavians: Normal flow hemodynamics were seen in bilateral subclavian              arteries. *See table(s) above for measurements and observations.     Preliminary    Scheduled Meds: . acetaminophen  1,000 mg Oral Q6H  . aspirin EC  325 mg Oral Daily  . carvedilol  12.5 mg Oral BID  . clopidogrel  75 mg Oral Daily  . insulin aspart  0-5 Units Subcutaneous QHS  . insulin aspart  0-9 Units Subcutaneous TID WC  . insulin glargine  8 Units Subcutaneous QHS  . levETIRAcetam  500 mg Oral BID  . ramipril  5 mg Oral Daily  . sodium chloride flush  3 mL Intravenous Q12H   Continuous Infusions: . cefTRIAXone (ROCEPHIN)  IV 2 g (06/04/20 1708)  . lactated ringers 10 mL/hr at 06/01/20 1243    LOS: 5 days   Kerney Elbe, DO [facility] PAGER is on AMION  If 7PM-7AM, please contact night-coverage [email or web address]

## 2020-06-05 DIAGNOSIS — N1831 Chronic kidney disease, stage 3a: Secondary | ICD-10-CM | POA: Diagnosis not present

## 2020-06-05 DIAGNOSIS — K8020 Calculus of gallbladder without cholecystitis without obstruction: Secondary | ICD-10-CM | POA: Diagnosis not present

## 2020-06-05 DIAGNOSIS — K831 Obstruction of bile duct: Secondary | ICD-10-CM | POA: Diagnosis not present

## 2020-06-05 LAB — COMPREHENSIVE METABOLIC PANEL
ALT: 43 U/L (ref 0–44)
AST: 25 U/L (ref 15–41)
Albumin: 2.1 g/dL — ABNORMAL LOW (ref 3.5–5.0)
Alkaline Phosphatase: 201 U/L — ABNORMAL HIGH (ref 38–126)
Anion gap: 9 (ref 5–15)
BUN: 26 mg/dL — ABNORMAL HIGH (ref 8–23)
CO2: 19 mmol/L — ABNORMAL LOW (ref 22–32)
Calcium: 8.5 mg/dL — ABNORMAL LOW (ref 8.9–10.3)
Chloride: 115 mmol/L — ABNORMAL HIGH (ref 98–111)
Creatinine, Ser: 1.2 mg/dL — ABNORMAL HIGH (ref 0.44–1.00)
GFR, Estimated: 44 mL/min — ABNORMAL LOW (ref >60)
Glucose, Bld: 178 mg/dL — ABNORMAL HIGH (ref 70–99)
Potassium: 3.9 mmol/L (ref 3.5–5.1)
Sodium: 143 mmol/L (ref 135–145)
Total Bilirubin: 0.7 mg/dL (ref 0.3–1.2)
Total Protein: 5 g/dL — ABNORMAL LOW (ref 6.5–8.1)

## 2020-06-05 LAB — CBC WITH DIFFERENTIAL/PLATELET
Abs Immature Granulocytes: 0.3 10*3/uL — ABNORMAL HIGH (ref 0.00–0.07)
Basophils Absolute: 0 10*3/uL (ref 0.0–0.1)
Basophils Relative: 0 %
Eosinophils Absolute: 0.4 10*3/uL (ref 0.0–0.5)
Eosinophils Relative: 4 %
HCT: 27.5 % — ABNORMAL LOW (ref 36.0–46.0)
Hemoglobin: 9.4 g/dL — ABNORMAL LOW (ref 12.0–15.0)
Immature Granulocytes: 3 %
Lymphocytes Relative: 20 %
Lymphs Abs: 2 10*3/uL (ref 0.7–4.0)
MCH: 33.3 pg (ref 26.0–34.0)
MCHC: 34.2 g/dL (ref 30.0–36.0)
MCV: 97.5 fL (ref 80.0–100.0)
Monocytes Absolute: 0.8 10*3/uL (ref 0.1–1.0)
Monocytes Relative: 8 %
Neutro Abs: 6.6 10*3/uL (ref 1.7–7.7)
Neutrophils Relative %: 65 %
Platelets: 143 10*3/uL — ABNORMAL LOW (ref 150–400)
RBC: 2.82 MIL/uL — ABNORMAL LOW (ref 3.87–5.11)
RDW: 13.3 % (ref 11.5–15.5)
WBC: 10.1 10*3/uL (ref 4.0–10.5)
nRBC: 0.2 % (ref 0.0–0.2)

## 2020-06-05 LAB — GLUCOSE, CAPILLARY
Glucose-Capillary: 139 mg/dL — ABNORMAL HIGH (ref 70–99)
Glucose-Capillary: 182 mg/dL — ABNORMAL HIGH (ref 70–99)
Glucose-Capillary: 117 mg/dL — ABNORMAL HIGH (ref 70–99)
Glucose-Capillary: 88 mg/dL (ref 70–99)

## 2020-06-05 LAB — MAGNESIUM: Magnesium: 2 mg/dL (ref 1.7–2.4)

## 2020-06-05 LAB — PHOSPHORUS: Phosphorus: 3.3 mg/dL (ref 2.5–4.6)

## 2020-06-05 MED ORDER — HEPARIN SODIUM (PORCINE) 5000 UNIT/ML IJ SOLN
5000.0000 [IU] | Freq: Three times a day (TID) | INTRAMUSCULAR | Status: DC
  Administered 2020-06-05 – 2020-06-10 (×16): 5000 [IU] via SUBCUTANEOUS
  Filled 2020-06-05 (×16): qty 1

## 2020-06-05 NOTE — Plan of Care (Signed)
  Problem: Health Behavior/Discharge Planning: Goal: Ability to manage health-related needs will improve Outcome: Progressing   Problem: Education: Goal: Knowledge of General Education information will improve Description: Including pain rating scale, medication(s)/side effects and non-pharmacologic comfort measures Outcome: Progressing   Problem: Nutrition Goal: Nutritional status is improving Description: Monitor and assess patient for malnutrition (ex- brittle hair, bruises, dry skin, pale skin and conjunctiva, muscle wasting, smooth red tongue, and disorientation). Collaborate with interdisciplinary team and initiate plan and interventions as ordered.  Monitor patient's weight and dietary intake as ordered or per policy. Utilize nutrition screening tool and intervene per policy. Determine patient's food preferences and provide high-protein, high-caloric foods as appropriate.  Outcome: Progressing

## 2020-06-05 NOTE — Progress Notes (Signed)
Inpatient Rehab Admissions:  Inpatient Rehab Consult received.  I met with patient and son, Randall Hiss at the bedside for rehabilitation assessment and to discuss goals and expectations of an inpatient rehab admission.  Both acknowledge understanding. Pt and son interested in pt pursuing CIR.  Pt appears to be an appropriate candidate for potential admission to CIR.   Will continue to follow.  Signed: Gayland Curry, [place], [place] Admissions Coordinator [phone]

## 2020-06-05 NOTE — Progress Notes (Signed)
PROGRESS NOTE    Taylor Castaneda  APO:[id number] DOB: 19-Mar-1933 DOA: 05/30/2020 PCP: [name], MD   Brief Narrative:  HPI per Dr. Fuller Plan on 05/30/20 Taylor Castaneda is a 87 y.o. female with medical history significant of diabetes mellitus type 2, hyperlipidemia, seizure disorder, history colon cancer s/p resection, and anemia presented with complaints of passing out while at her condo.  The patient's daughter found her on the floor and was able to help her get to a chair.  However when EMS arrived patient had a witnessed generalized tonic-clonic seizure with urinary incontinence lasting approximately 1 minute prior to self resolving.  They did not have to give any abortive medications.  She had been compliant with her medications and last reported having a seizure over a year ago.  At baseline patient lives alone and handles all of her own medications.  Patient had not been feeling well earlier in the day with complaints of epigastric abdominal pain with radiation to her back.  Denied any recent trauma prior to onset of symptoms.  Symptoms lasted approximately 6 to 7 hours prior to self resolving.  She had taken Tums and Tylenol prior to coming into the hospital. CT scan of the head and cervical spine did not note any acute abnormalities.  CT imaging of the thoracic and lumbar spine noted severe spinal stenosis at L4-5 and large nonobstructing left renal stone.  Bowel sounds were noted to be stable.  Labs at the outside facility yesterday had been significant for WBC 17.8, hemoglobin 11.4, BUN 37, creatinine 1.4, alkaline phosphatase 283, AST 315, ALT 235, total bilirubin 3.3 and lactic acid initially 4->2.6.  COVID-19 and influenza screening was negative.  Right upper quadrant ultrasound revealed cholelithiasis without evidence of acute cholecystitis and cirrhosis.  Case had been discussed with GI here at [facility] who recommended MRCP which revealed cholelithiasis without signs of acute  cholecystitis, suspected small common bile duct stones distally with no duct dilatation, and pancreatic atrophy with dilatation in the mid body tail region with abrupt transition point concerning for possible stricture.  She had been given Keppra 1000 mg IV and 500 mL of normal saline IV fluids.  Patient had been accepted to the hospitalist service to a medical telemetry bed.  Repeat labs revealed WBC 25.6, hemoglobin 10.7, alkaline phosphatase 270, AST 219, ALT 207, and total bilirubin 3.7, lactic acid 2.2.  **Interim History  Patient underwent an ERCP and she had a periampullary diverticulum and she was noted to have choledocholithiasis which is status post biliary sphincterotomy and sphincteroplasty and balloon extraction.  She had cholelithiasis without cholecystitis and she had a mild gastritis.  Pancreatogram was not obtained and GI recommended watching for pancreatitis, bleeding or perforation and cholangitis.  Recommend a clear liquid diet and continue current medications and continue antibiotics for 5 days total and stopping the Flagyl.  He also recommended obtaining a surgical consultation while inpatient for questionable lap chole and following the results of the CA 19-9, CEA, IgG for.  Dr. Lyndel Safe feels that she will require an EUS as an outpatient for evaluation of pancreatic duct stricture.  After procedure she was sedated and very somnolent and drowsy and unable to be aroused given that she was asleep  General surgery was consulted on 05/31/2020 and after discussion with the patient they recommended a cholecystectomy which the patient was agreeable to.  Patient underwent a cholecystectomy on 06/01/2020 and is doing well postoperatively.  This morning she is slated to be  discharged however started having some word finding difficulties and could not articulate her words properly.  This was an acute change from her baseline and daughter was at bedside.  When I went to evaluate her if she was talking  normally but did also have some disarticulation of her words and after discussion with neurology a code stroke was called.  Neurology evaluated and symptoms had resolved but they felt she still needed further imaging so we are obtaining MRI of the brain and she was transferred to [address] for neuro checks closely.  MRI done and showed multiple punctate foci of acute ischemia within the right centrum semiovale with no hemorrhages or mass-effect seen.  Neurology recommended full stroke work-up and she ended up going for an MRA of the head without contrast, echocardiogram as well as Carotid Dopplers.  Neurology felt that she had an acute CVA versus subacute CVA and recommended aspirin and Plavix 325 mg and 75 mg p.o. respectively for 3 months and then just Plavix alone given her severe intracranial stenosis.  PT OT now recommending CIR so we have consulted to CIR for further evaluation and placement at inpatient rehab  06/04/2020 orthostatic vital signs were done and she did not meet orthostatic vital sign requirement but she did drop from lying to sitting to standing and were from 172/60 from lying and standing and went to 158/60. Currently awaiting CIR evaluation but she feels better today  06/05/2020 she is not orthostatic yesterday and LFTs have improved and her white blood cell count has resolved.  Remains on dual antiplatelet therapy and will consider resuming her statin now that her LFTs are improved.  Rest of her labs appear stable and blood count is stable as well.  We will have CIR evaluate and consult has been placed.  Assessment & Plan:   Principal Problem:   Cholelithiasis Active Problems:   Hyperlipidemia associated with type 2 diabetes mellitus (HCC)   CKD (chronic kidney disease), stage III (HCC)   Hyperbilirubinemia   Normocytic anemia   Transaminitis   Pancreatic duct dilated   Common bile duct (CBD) obstruction   Cerebral thrombosis with cerebral infarction   Cholelithiasis and  choledocholithiasis status post ERCP on 05/31/2020 and now cholecystectomy postoperative day 3 Obstructive Jaundice with Hyperbilirubinemia and Abnormal LFTs, improving and resolved -Acute.  Patient present with complaints of abdominal pain with radiation to her back.   -Initial CT imaging studies significant for cholelithiasis without signs of cholecystitis and cirrhosis.   -GI has been initially consulted and MRCP was recommended.  MRCP revealed cholelithiasis without cholecystitis, small gallstones distally without clear duct dilatation, pancreatic duct dilatation in the mid body region with an abrupt transition, but no obvious obstructing lesion.   -Patient transferred for need of ERCP and this was done on 05/31/2020 -Admit to a medical telemetry -Check blood cultures and showed NGTD at 5 days -Diet had been advanced but she was made n.p.o. when she started having difficulty with her speech and word finding -Start empiric antibiotics of Rocephin and metronidazole; GI recommending stopping Flagyl  -Follow up on CA19-9 was less than 2, CEA was 3.2, IgG4 was 49 -[facility] consult, will follow-up for further recommendations -Underwent ERCP today and she had a periampullary diverticulum and she was noted to have choledocholithiasis which is status post biliary sphincterotomy and sphincteroplasty and balloon extraction.  She had cholelithiasis without cholecystitis and she had a mild gastritis.   -Pancreatogram was not obtained and GI recommended watching for pancreatitis, bleeding  or perforation and cholangitis.   -Dr. Lyndel Safe Recommend a clear liquid diet and continue current medications and continue antibiotics for 5 days total and stopping the Flagyl. He also recommended obtaining a surgical consultation while inpatient for questionable lap chole and following the results of the CA 19-9, CEA, IgG4. -T Bili went from 3.7 and its peak is now 0.7 -General Surgery consulted for further evaluation and  they they discussed the possibility of cholecystectomy with the patient.  She is agreeable to surgical intervention and decided to proceed with cholecystectomy and this was done on 06/01/2020 -Will need PT/OT to evaluate and Treat to determine safe discharge disposition and this can be done after her surgery -Surgery has evaluated patient and cleared her for discharge and PT OT recommending home health PT and OT initially but now recommending CIR and will consult CIR for assistance with placement  Expressive Aphasia in the setting of acute versus subacute CVA the setting of large vessel disease -Patient had significant difficulty time finding her words on 05/07/1999 and when she did she repeat herself and kept saying "free" -When I went to evaluate her she was improving but still had some difficulty articulating her words -Spoke with neurology who recommended calling a code stroke and a stat head CT was ordered but when she was evaluated by neurology her symptoms resolved so code stroke was canceled and MRI was ordered -patient was transferred to [address] for continuing neurochecks every 2 hours next-continue monitor carefully and follow-up with neurology recommendations and follow-up on MRI -MRI was done and showed "Multiple punctate foci of acute ischemia within the right centrum semiovale. No hemorrhage or mass effect." -MRA of the Head w/o Contrast was recommended and showed "Advanced intracranial atherosclerosis. No large vessel occlusion identified, but  Severe stenosis at the Right MCA bifurcation, with long segment involvement of the superior M2. Severe stenosis distal Left ICA siphon and Left MCA superior M2 branch origin. Moderate stenosis Right PCA P2 segment." -Carotid Dopplers done and showed "Velocities in the right ICA are consistent with a 1-39% stenosis.   Left Carotid: Velocities in the left ICA are consistent with a 1-39% stenosis." -ECHOCardiogram done and showed EF of 60 to 65% with grade  2 status function noted within right ventricular systolic function with mildly reduced with no mention of thrombus noted -EEG will be ordered to rule out Seizures showed that the study was suggestive of cortical dysfunction in the right more than left temporal region with nonspecific etiology with no seizures or epileptiform discharges seen throughout the recording -Surgery evaluated and felt that her speech is worse when she stands up; Will continue to observe and get Orthostatics; Will need TED Hose; Repeat Orthostatic done and she was not orthostatic today and TED hose were ordered -SLP evaluated and reccommending Home Health SLP -Neurology recommending ASA 325 mg po Daily and Plavix 75 mg po Daily for 3 months then Plavix along given her sever Intracranial Stenosis -Statin Held due to Abnormal LFTs and resume in the outpatient setting with Dr. Leta Baptist -PT/OT now recommending CIR so will consult Rehab for Screening and MD for Evaluation to CIR -Follow-up on neurology recommendations and they have signed off the case; will consider resuming her statin now that her LFTs are improved and if they are stable for few days will resume tomorrow  Pancreatic Duct Dilatation  -As seen above.  Lipase was limits and no significant signs of inflammation noted on imaging. -CT of the abdomen with and without contrast -  Further Care Per GI -Dr. Lyndel Safe feels that she will require an EUS as an outpatient for evaluation of pancreatic duct stricture as she did not have a Pancreatogram done here  Leukocytosis and lactic acidosis, improving -Acute.  -WBC elevated from 17.8 to 25.6 with lactic acid initially elevated up to 4, but slowly trending down.  -Patient vital signs were noted to be relatively within normal limits not necessarily meeting sepsis criteria. -WBC is now resolved and 10.1 -LA is trending down and LA was last 2.4 -Follow-up blood cultures and pending  -Check urinalysis and culture if  necessary -She is on Abx and on IV Ceftriaxone 2 g q24h -IV fluid hydration is now -Trend lactic acid levels  Seizure Disorder -Patient had witnessed generalized tonic-clonic seizure lasting 1 minute with continence of urine prior to admission.   -Suspect this could have been the reason patient had fallen on the floor as well.   -Home medications include Keppra 250 mg twice daily. Patient had received 1000 mg IV while hospitalized. -Seizure precaution  -Continued home regimen of Keppra initially but Neurology has increased Keppra to 500 mg po BID -She evaluated and she follows up with Dr. Andrey Spearman and [facility] and because of this admission she is found at home with tremorous activity and EEG showed cortical dysfunction.  Neurology thinks that she could have had some cerebral hypoperfusion with orthostatic hypotension so we will recheck orthostatics in the morning -MRI of the brain was obtained and above  Essential Hypertension but now with Orthostatic Hypotension -Home blood pressure medications include Altace 5 mg daily, Coreg 12.5 mg twice daily, and furosemide 20 mg daily. -Continue to Hold furosemide but resumed Ramipril 5 mg po Daily -Continue Carvedilol 12.5 mg po BID -Continue to Monitor BP per Protocol  -Repeat Orthostatic VS pending  -Last blood pressure was 144/55 -Continue with TED hose  HX of A Fib -Currently in NSR -Not on Anticoagulation due to Fall risk -Follow up with Cardiology as an outpateint and Neurology does not feel she has had an embolic stroke  Acute kidney injury superimposed on chronic kidney disease IIIb Metabolic Acidosis -Creatinine was initially noted to be 1.4 (around patient's baseline), but repeat check today 1.76 with BUN 34 on admission; Now BUN/Cr stable and much improved to 26/1.20 -Check CK and was 75 -Patient has a small acidosis now with a CO2 of 19, chloride level 115, and anion gap of 9 -IV fluid hydration is now stopped -Avoid  nephrotoxic medications, contrast dyes, hypotension and renally dose medications -Repeat CMP in a.m.  Normocytic Anemia -Acute.     -Hemoglobin/hematocrit is relatively stable at 9.8/30.3 yesterday and is now 9.4/27.5 today -Check anemia panel and showed an iron level of 50, TIBC 105, TIBC 155, saturation ratio 32%, ferritin level of 393, folate of 21.4, and vitamin B12 of 588 -Continue to monitor for signs and symptoms of bleeding; currently no overt bleeding noted -Repeat CBC in a.m.  Diabetes mellitus type 2, uncontrolled -On admission glucose elevated up to 205.  Last hemoglobin A1c noted to be 8.7 on 02/12/2020.  Home medication includes Lantus 16 units nightly. -Hypoglycemic protocol -Check hemoglobin A1c was 7.9 -Continue half dose of Lantus -CBGs before every meal with moderate SSI  -Diabetes education coronary is following closely -CBGs ranging from 79-1 49  Abnormal LFTs and Hyperbilirubinemia, slowly improving -Acute.  Patient found to have AST 315-> 25 today and is resolved,  -ALT 235-> 43 and is resolved, and total bilirubin 3.3->3.7 -> 0.7. -  Continue to monitor and trend Hepatic Fxn Panel -Repeat CMP in the AM  Normocytic Anemia -Patient's hemoglobin/hematocrit has gone from 10.9/32.1 -> 9.4/29.1 -> 9.8/30.3 and today it is 9.4/27.5 -Check Anemia Panel in the AM  -Continue to Monitor for S/Sx of Bleeding; Currently no overt bleeding noted -Repeat CBC in the AM   Thrombocytopenia -Patient's Platelet count has gone from 113 -> 124 -> 119 -> 123 -Continue to Monitor and Trend and Continue to Monitor for S/Sx of Bleeding -Repeat CBC in the AM   Elevated Alk Phos -Was elevated to 282 on admission is trended down and is now 201 -Continue to monitor and trend repeat CMP in a.m.  Hyperlipidemia -Hold Atorvastatin due to elevated liver enzymes and resume once they are normalized and if they are normal tomorrow will resume  DVT prophylaxis: SCDs; Will add Heparin  5,000 units sq q8h Code Status: FULL CODE Family Communication: Spoke with son at the bedside Disposition Plan: Has been cleared from a gastroenterology, general surgery as well as a neurologic perspective.  She will need to go to CIR if accepted versus SNF as she appears medically stable to be discharged now  Status is: Inpatient  Remains inpatient appropriate because:Unsafe d/c plan, IV treatments appropriate due to intensity of illness or inability to take PO and Inpatient level of care appropriate due to severity of illness   Dispo: The patient is from: Home              Anticipated d/c is to: CIR              Anticipated d/c date is: 1 day              Patient currently is medically stable to d/c.  Consultants:   Gastroenterology  General Surgery    Procedures:  ERCP Findings:      The scout film was normal. The upper GI tract was traversed under direct       vision without detailed examination. Mild antral gastritis was noted.      The major papilla was located at lower lip of periampullary diverticula.       The bile duct was deeply cannulated with the short-nosed traction       sphincterotome. Contrast was injected. I personally interpreted the bile       duct images. There was brisk flow of contrast through the ducts. Image       quality was adequate. Contrast extended to the entire biliary tree. 3       filling defects c/w choledocholithiasis was found in CBD which was       mildly dilated to 10 mm. Low insertion of cystic duct was noted. Cystic       duct did fill. Multiple filling defects in the gallbladder consistent       with cholelithiasis.      A 6 mm biliary sphincterotomy was made with a monofilament traction       (standard) sphincterotome using ERBE electrocautery at 12 o'clock       position. There was no post-sphincterotomy bleeding. Due to       periampullary diverticula, we elected to proceed with sphincteroplasty.       Sphincteroplasty was performed  using 03-15-11 mm x 5.5 cm CRE balloon to       a maximum balloon size of 10 mm. The biliary tree was swept multiple       times with a 12 mm balloon starting at the  bifurcation. All 3 stones       were removed. Nothing was found on postocclusion cholangiogram. The bile       was green. There was no pus.      Pancreatogram was not obtained. We did try few times but the wire would       not find pancreatic duct after the sphincterotomy.      Mild antral gastritis was biopsied with a cold forceps for histology to       r/o HP. Impression:               -Periampullary diverticulum.                           -Choledocholithiasis s/p biliary sphincterotomy,                            sphincteroplasty and balloon extraction.                           -Cholelithiasis without cholecystitis.                           -Mild gastritis.                           -Pancreatogram not obtained. Recommendation:           - Watch for pancreatitis, bleeding, perforation,                            and cholangitis.                           - Clear liquid diet.                           - Continue present medications.                           - Continue antibiotics x total of 5 days. Can stop                            Flagyl.                           - Can obtain surgical consultation while inpatient                            ?Lap chole.                           - Follow results of CA 19?9/CEA/IgG4. I do believe                            she will require EUS as an outpatient for                            evaluation of PD stricture. I will run it by Dr.  Jacobs/Dr. Mansouraty                           - The findings and recommendations were discussed                            with the patient's family.  Laparoscopic cholecystectomy was performed by Dr. Romana Juniper on 06/01/2020  Antimicrobials: Anti-infectives (From admission, onward)   Start     Dose/Rate Route  Frequency Ordered Stop   05/30/20 1615  cefTRIAXone (ROCEPHIN) 2 g in sodium chloride 0.9 % 100 mL IVPB        2 g 200 mL/hr over 30 Minutes Intravenous Every 24 hours 05/30/20 1516     05/30/20 1615  metroNIDAZOLE (FLAGYL) tablet 500 mg  Status:  Discontinued        500 mg Oral Every 8 hours 05/30/20 1516 05/31/20 1611        Subjective: Seen And examined at bedside she was doing relatively well.  Laying in bed and had no issues.  Happy that her numbers are improved.  Denies any chest pain, lightheadedness or dizziness.  Understands that she needs rehab and agreeable to.  The patient's son is at bedside and all of his questions were answered to his satisfaction.  Objective: Vitals:   06/05/20 0000 06/05/20 0420 06/05/20 0428 06/05/20 0500  BP: 140/60 (!) 182/63 (!) 165/49 (!) 131/53  Pulse:  (!) 57 (!) 58 (!) 56  Resp: _0 Temp: 97.6 F (36.4 C) (!) 97.4 F (36.3 C)    TempSrc: Oral Oral    SpO2: 100% 100% 100%   Weight:      Height:        Intake/Output Summary (Last 24 hours) at 06/05/2020 0820 Last data filed at 06/04/2020 1300 Gross per 24 hour  Intake 240 ml  Output --  Net 240 ml   Filed Weights   05/30/20 1645 05/31/20 0912 06/01/20 1045  Weight: 50.8 kg 50.8 kg 50.8 kg   Examination: Physical Exam:  Constitutional: WN/WD elderly Caucasian female currently no acute distress appears calm and comfortable sitting in the bed Eyes: Lids and conjunctivae normal, sclerae anicteric  ENMT: External Ears, Nose appear normal. Grossly normal hearing.  Neck: Appears normal, supple, no cervical masses, normal ROM, no appreciable thyromegaly; no JVD Respiratory: Diminished to auscultation bilaterally, no wheezing, rales, rhonchi or crackles. Normal respiratory effort and patient is not tachypenic. No accessory muscle use.  Unlabored Cardiovascular: RRR, no murmurs / rubs / gallops. S1 and S2 auscultated.  No appreciable extremity edema she is wearing TED hose and her  SCDs Abdomen: Soft, non-tender, non-distended. Bowel sounds positive.  GU: Deferred. Musculoskeletal: No clubbing / cyanosis of digits/nails. No joint deformity upper and lower extremities.  Skin: No rashes, lesions, ulcers on limited skin evaluation. No induration; Warm and dry.  Neurologic: CN 2-12 grossly intact with no focal deficits. Romberg sign and cerebellar reflexes not assessed.  Psychiatric: Normal judgment and insight. Alert and oriented x 3. Normal mood and appropriate affect.   Data Reviewed: I have personally reviewed following labs and imaging studies  CBC: Recent Labs  Lab 05/30/20 0139 05/31/20 0043 06/01/20 0035 06/02/20 0022 06/03/20 0330 06/04/20 0213 06/05/20 0327  WBC 25.6*   < > 13.2* 12.1* 12.0* 11.4* 10.1  NEUTROABS 23.7*  --  11.6*  --  8.4* 7.9* 6.6  HGB 10.7*   < >  10.0* 10.9* 9.4* 9.8* 9.4*  HCT 31.4*   < > 29.8* 32.1* 29.1* 30.3* 27.5*  MCV 95.7   < > 98.3 96.7 99.7 98.7 97.5  PLT 147*   < > 113* 124* 119* 123* 143*   < > = values in this interval not displayed.   Basic Metabolic Panel: Recent Labs  Lab 06/01/20 0035 06/02/20 0022 06/03/20 0330 06/04/20 0213 06/05/20 0327  NA 142 140 142 142 143  K 4.8 4.8 4.2 4.0 3.9  CL 112* 111 114* 114* 115*  CO2 18* 18* 19* 19* 19*  GLUCOSE 255* 243* 127* 103* 178*  BUN 33* 37* 36* 28* 26*  CREATININE 1.65* 1.64* 1.67* 1.18* 1.20*  CALCIUM 7.8* 7.6* 7.9* 8.4* 8.5*  MG 1.8 1.9 1.9 1.9 2.0  PHOS 4.3  --  2.6 3.0 3.3   GFR: Estimated Creatinine Clearance: 23.4 mL/min (A) (by C-G formula based on SCr of 1.2 mg/dL (H)). Liver Function Tests: Recent Labs  Lab 06/01/20 0035 06/02/20 0022 06/03/20 0330 06/04/20 0213 06/05/20 0327  AST 70* 70* 53* 43* 25  ALT 93* 89* 69* 56* 43  ALKPHOS 235* 242* 192* 200* 201*  BILITOT 3.0* 1.4* 0.9 0.8 0.7  PROT 5.2* 5.7* 4.9* 5.0* 5.0*  ALBUMIN 2.3* 2.4* 2.3* 2.3* 2.1*   Recent Labs  Lab 05/29/20 2230  LIPASE 25   No results for input(s): AMMONIA in  the last 168 hours. Coagulation Profile: No results for input(s): INR, PROTIME in the last 168 hours. Cardiac Enzymes: Recent Labs  Lab 05/30/20 1717  CKTOTAL 75   BNP (last 3 results) No results for input(s): PROBNP in the last 8760 hours. HbA1C: Recent Labs    06/02/20 2116  HGBA1C 7.9*   CBG: Recent Labs  Lab 06/04/20 0600 06/04/20 1130 06/04/20 1638 06/04/20 2154 06/05/20 0630  GLUCAP 79 149* 138* 211* 139*   Lipid Profile: No results for input(s): CHOL, HDL, LDLCALC, TRIG, CHOLHDL, LDLDIRECT in the last 72 hours. Thyroid Function Tests: Recent Labs    06/02/20 1430  TSH 1.712   Anemia Panel: Recent Labs    06/02/20 1430  VITAMINB12 550   Sepsis Labs: Recent Labs  Lab 05/29/20 2135 05/30/20 0139 05/30/20 1516 05/30/20 1655  LATICACIDVEN 2.6* 2.2* 1.9 2.4*    Recent Results (from the past 240 hour(s))  Resp Panel by RT-PCR (Flu A&B, Covid) Nasopharyngeal Swab     Status: None   Collection Time: 05/29/20  9:49 PM   Specimen: Nasopharyngeal Swab; Nasopharyngeal(NP) swabs in vial transport medium  Result Value Ref Range Status   SARS Coronavirus 2 by RT PCR NEGATIVE NEGATIVE Final    Comment: (NOTE) SARS-CoV-2 target nucleic acids are NOT DETECTED.  The SARS-CoV-2 RNA is generally detectable in upper respiratory specimens during the acute phase of infection. The lowest concentration of SARS-CoV-2 viral copies this assay can detect is 138 copies/mL. A negative result does not preclude SARS-Cov-2 infection and should not be used as the sole basis for treatment or other patient management decisions. A negative result may occur with  improper specimen collection/handling, submission of specimen other than nasopharyngeal swab, presence of viral mutation(s) within the areas targeted by this assay, and inadequate number of viral copies(<138 copies/mL). A negative result must be combined with clinical observations, patient history, and  epidemiological information. The expected result is Negative.  Fact Sheet for Patients:  EntrepreneurPulse.com.au  Fact Sheet for Healthcare Providers:  IncredibleEmployment.be  This test is no t yet approved or cleared by the [place]  States FDA and  has been authorized for detection and/or diagnosis of SARS-CoV-2 by FDA under an Emergency Use Authorization (EUA). This EUA will remain  in effect (meaning this test can be used) for the duration of the COVID-19 declaration under Section 564(b)(1) of the Act, 21 U.S.C.section 360bbb-3(b)(1), unless the authorization is terminated  or revoked sooner.       Influenza A by PCR NEGATIVE NEGATIVE Final   Influenza B by PCR NEGATIVE NEGATIVE Final    Comment: (NOTE) The Xpert Xpress SARS-CoV-2/FLU/RSV plus assay is intended as an aid in the diagnosis of influenza from Nasopharyngeal swab specimens and should not be used as a sole basis for treatment. Nasal washings and aspirates are unacceptable for Xpert Xpress SARS-CoV-2/FLU/RSV testing.  Fact Sheet for Patients: EntrepreneurPulse.com.au  Fact Sheet for Healthcare Providers: IncredibleEmployment.be  This test is not yet approved or cleared by the [place] FDA and has been authorized for detection and/or diagnosis of SARS-CoV-2 by FDA under an Emergency Use Authorization (EUA). This EUA will remain in effect (meaning this test can be used) for the duration of the COVID-19 declaration under Section 564(b)(1) of the Act, 21 U.S.C. section 360bbb-3(b)(1), unless the authorization is terminated or revoked.  Performed at [facility], [address] [place], [place] [address]   Culture, blood (x 2)     Status: None   Collection Time: 05/30/20  5:00 PM   Specimen: BLOOD RIGHT ARM  Result Value Ref Range Status   Specimen Description BLOOD RIGHT ARM  Final   Special Requests   Final    BOTTLES  DRAWN AEROBIC ONLY Blood Culture results may not be optimal due to an inadequate volume of blood received in culture bottles   Culture   Final    NO GROWTH 5 DAYS Performed at [facility], [place] [address], [place], [place] [address]    Report Status 06/04/2020 FINAL  Final  Culture, blood (x 2)     Status: None   Collection Time: 05/30/20  5:03 PM   Specimen: BLOOD RIGHT ARM  Result Value Ref Range Status   Specimen Description BLOOD RIGHT ARM  Final   Special Requests   Final    BOTTLES DRAWN AEROBIC ONLY Blood Culture results may not be optimal due to an inadequate volume of blood received in culture bottles   Culture   Final    NO GROWTH 5 DAYS Performed at [facility], [place] [address] [place], [place] [address]    Report Status 06/04/2020 FINAL  Final  Surgical pcr screen     Status: None   Collection Time: 05/31/20  8:24 PM   Specimen: Nasal Mucosa; Nasal Swab  Result Value Ref Range Status   MRSA, PCR NEGATIVE NEGATIVE Final   Staphylococcus aureus NEGATIVE NEGATIVE Final    Comment: (NOTE) The Xpert SA Assay (FDA approved for NASAL specimens in patients 82 years of age and older), is one component of a comprehensive surveillance program. It is not intended to diagnose infection nor to guide or monitor treatment. Performed at [facility], [place] [address], [place], [place] [address]      RN Pressure Injury Documentation:     Estimated body mass index is 22.62 kg/m as calculated from the following:   Height as of this encounter: 4' 11" (1.499 m).   Weight as of this encounter: 50.8 kg.  Malnutrition Type:  Nutrition Problem: Inadequate oral intake Etiology: altered GI function   Malnutrition  Characteristics:  Signs/Symptoms: NPO status   Nutrition Interventions:  Interventions: Refer to RD note for recommendations   Radiology Studies: MR ANGIO HEAD WO CONTRAST  Result Date: 06/03/2020 CLINICAL DATA:  87 year old female with transient  aphasia and punctate foci of acute ischemia in the right hemisphere white matter on MRI this morning. EXAM: MRA HEAD WITHOUT CONTRAST TECHNIQUE: Angiographic images of the Circle of Willis were obtained using MRA technique without intravenous contrast. COMPARISON:  Brain MRI 0414 hours today. CTA head and neck 03/05/2019. FINDINGS: And no intracranial mass effect or ventriculomegaly. Antegrade flow in the posterior circulation with codominant patent distal vertebral arteries. No distal vertebral or vertebrobasilar junction stenosis. Patent right PICA origin, left AICA which may be dominant. Patent basilar artery although diminutive distally with mild to moderate mid basilar stenosis on series 1055, image 10. The basilar artery functionally terminates in SCAs. Fetal type bilateral PCA origins. Both SCA is are patent. Bilateral PCAs are patent, with mild irregularity on the left and mild to moderate right PCA P2 irregularity and stenosis. Antegrade flow in both ICA siphons. On the right there is mild siphon irregularity and stenosis. But there is severe left siphon supraclinoid stenosis as demonstrated on series 1045, image 16. Posterior communicating artery origins are normal. Patent bilateral carotid termini. Patent MCA and ACA origins. Diminutive or absent anterior communicating artery. Bilateral visible ACA branches are within normal limits. Left MCA M1 segment bifurcates early without stenosis but there is severe stenosis at the origin of a superior left MCA M2 branch on series 1045 image 13. Other left MCA branches are within normal limits. Right MCA M1 is patent but there is severe stenosis at the right MCA bifurcation also affecting the proximal superior M2 branch, as well as moderate stenosis at the origin of the inferior M2 branch (series 1045, image 10). No complete occlusion of a right MCA branch identified. IMPRESSION: 1. Advanced intracranial atherosclerosis. No large vessel occlusion identified, but: -  Severe stenosis at the Right MCA bifurcation, with long segment involvement of the superior M2. - Severe stenosis distal Left ICA siphon and Left MCA superior M2 branch origin. 2. Moderate stenosis Right PCA P2 segment. Electronically Signed   By: Genevie Ann M.D.   On: 06/03/2020 11:33   ECHOCARDIOGRAM COMPLETE  Result Date: 06/03/2020    ECHOCARDIOGRAM REPORT   Patient Name:   AEISHA MINARIK Date of Exam: 06/03/2020 Medical Rec #:  [id number]      Height:       59.0 in Accession #:    [id number]     Weight:       112.0 lb Date of Birth:  Oct 03, 1932      BSA:          1.442 m Patient Age:    82 years       BP:           157/67 mmHg Patient Gender: F              HR:           61 bpm. Exam Location:  Inpatient Procedure: 2D Echo, Cardiac Doppler and Color Doppler Indications:    Stroke  History:        Patient has prior history of Echocardiogram examinations, most                 recent 03/06/2019. Abnormal ECG, Stroke, Arrythmias:Atrial  Fibrillation, Signs/Symptoms:Dyspnea; Risk Factors:Hypertension                 and Diabetes.  Sonographer:    Roseanna Rainbow RDCS Referring Phys: [id number] [place] [facility]  Sonographer Comments: Technically difficult study due to poor echo windows. Image acquisition challenging due to patient body habitus. IMPRESSIONS  1. Left ventricular ejection fraction, by estimation, is 60 to 65%. The left ventricle has normal function. The left ventricle has no regional wall motion abnormalities. Left ventricular diastolic parameters are consistent with Grade II diastolic dysfunction (pseudonormalization).  2. Right ventricular systolic function is mildly reduced. The right ventricular size is normal. There is severely elevated pulmonary artery systolic pressure. The estimated right ventricular systolic pressure is 48.2 mmHg.  3. The mitral valve is normal in structure. Mild mitral valve regurgitation. No evidence of mitral stenosis. Moderate mitral annular calcification.  4. The  aortic valve is tricuspid. Aortic valve regurgitation is not visualized. Mild aortic valve sclerosis is present, with no evidence of aortic valve stenosis.  5. The inferior vena cava is normal in size with <50% respiratory variability, suggesting right atrial pressure of 8 mmHg.  6. There is a pleural effusion. FINDINGS  Left Ventricle: Left ventricular ejection fraction, by estimation, is 60 to 65%. The left ventricle has normal function. The left ventricle has no regional wall motion abnormalities. The left ventricular internal cavity size was normal in size. There is  no left ventricular hypertrophy. Left ventricular diastolic parameters are consistent with Grade II diastolic dysfunction (pseudonormalization). Right Ventricle: The right ventricular size is normal. No increase in right ventricular wall thickness. Right ventricular systolic function is mildly reduced. There is severely elevated pulmonary artery systolic pressure. The tricuspid regurgitant velocity is 3.63 m/s, and with an assumed right atrial pressure of 8 mmHg, the estimated right ventricular systolic pressure is 70.7 mmHg. Left Atrium: Left atrial size was normal in size. Right Atrium: Right atrial size was normal in size. Pericardium: There is a pleural effusion. There is no evidence of pericardial effusion. Mitral Valve: The mitral valve is normal in structure. There is mild calcification of the mitral valve leaflet(s). Moderate mitral annular calcification. Mild mitral valve regurgitation. No evidence of mitral valve stenosis. MV peak gradient, 8.4 mmHg. The mean mitral valve gradient is 2.0 mmHg. Tricuspid Valve: The tricuspid valve is normal in structure. Tricuspid valve regurgitation is trivial. Aortic Valve: The aortic valve is tricuspid. Aortic valve regurgitation is not visualized. Mild aortic valve sclerosis is present, with no evidence of aortic valve stenosis. Pulmonic Valve: The pulmonic valve was normal in structure. Pulmonic valve  regurgitation is not visualized. Aorta: The aortic root is normal in size and structure. Venous: The inferior vena cava is normal in size with less than 50% respiratory variability, suggesting right atrial pressure of 8 mmHg. IAS/Shunts: No atrial level shunt detected by color flow Doppler.  LEFT VENTRICLE PLAX 2D LVIDd:         3.15 cm     Diastology LVIDs:         2.00 cm     LV e' medial:    3.57 cm/s LV PW:         1.05 cm     LV E/e' medial:  19.8 LV IVS:        1.00 cm     LV e' lateral:   2.76 cm/s LVOT diam:     1.50 cm     LV E/e' lateral: 25.6 LV SV:  41 LV SV Index:   29 LVOT Area:     1.77 cm  LV Volumes (MOD) LV vol d, MOD A2C: 34.0 ml LV vol d, MOD A4C: 40.7 ml LV vol s, MOD A2C: 15.3 ml LV vol s, MOD A4C: 10.5 ml LV SV MOD A2C:     18.7 ml LV SV MOD A4C:     40.7 ml LV SV MOD BP:      24.1 ml RIGHT VENTRICLE            IVC RV S prime:     5.77 cm/s  IVC diam: 1.80 cm TAPSE (M-mode): 1.2 cm LEFT ATRIUM             Index       RIGHT ATRIUM           Index LA diam:        3.30 cm 2.29 cm/m  RA Area:     10.30 cm LA Vol (A2C):   15.9 ml 11.03 ml/m RA Volume:   19.80 ml  13.74 ml/m LA Vol (A4C):   19.1 ml 13.25 ml/m LA Biplane Vol: 17.7 ml 12.28 ml/m  AORTIC VALVE LVOT Vmax:   101.00 cm/s LVOT Vmean:  64.900 cm/s LVOT VTI:    0.234 m  AORTA Ao Root diam: 2.30 cm MITRAL VALVE               TRICUSPID VALVE MV Area (PHT): 4.15 cm    TR Peak grad:   52.7 mmHg MV Peak grad:  8.4 mmHg    TR Vmax:        363.00 cm/s MV Mean grad:  2.0 mmHg MV Vmax:       1.45 m/s    SHUNTS MV Vmean:      56.3 cm/s   Systemic VTI:  0.23 m MV Decel Time: 183 msec    Systemic Diam: 1.50 cm MV E velocity: 70.78 cm/s Loralie Champagne MD Electronically signed by Loralie Champagne MD Signature Date/Time: 06/03/2020/12:42:02 PM    Final    VAS US CAROTID  Result Date: 06/03/2020 Carotid Arterial Duplex Study Indications:  Speech disturbance. Risk Factors: Hyperlipidemia, Diabetes. Performing Technologist: Maudry Mayhew  MHA, RDMS, RVT, RDCS  Examination Guidelines: A complete evaluation includes B-mode imaging, spectral Doppler, color Doppler, and power Doppler as needed of all accessible portions of each vessel. Bilateral testing is considered an integral part of a complete examination. Limited examinations for reoccurring indications may be performed as noted.  Right Carotid Findings: +----------+-------+-------+--------+---------------------------------+--------+           PSV    EDV    StenosisPlaque Description               Comments           cm/s   cm/s                                                     +----------+-------+-------+--------+---------------------------------+--------+ CCA Prox  55     7                                                        +----------+-------+-------+--------+---------------------------------+--------+  CCA Distal62     9              smooth and heterogenous                   +----------+-------+-------+--------+---------------------------------+--------+ ICA Prox  54     16             smooth and heterogenous                   +----------+-------+-------+--------+---------------------------------+--------+ ICA Distal73     20                                                       +----------+-------+-------+--------+---------------------------------+--------+ ECA       68                    heterogenous, irregular and                                               calcific                                  +----------+-------+-------+--------+---------------------------------+--------+ +----------+--------+-------+----------------+-------------------+           PSV cm/sEDV cmsDescribe        Arm Pressure (mmHG) +----------+--------+-------+----------------+-------------------+ [id number]             Multiphasic, WNL                    +----------+--------+-------+----------------+-------------------+  +---------+--------+--+--------+-+---------+ VertebralPSV cm/s36EDV cm/s7Antegrade +---------+--------+--+--------+-+---------+  Left Carotid Findings: +----------+--------+--------+--------+---------------------+------------------+           PSV cm/sEDV cm/sStenosisPlaque Description   Comments           +----------+--------+--------+--------+---------------------+------------------+ CCA Prox  58      9                                    intimal thickening +----------+--------+--------+--------+---------------------+------------------+ CCA Distal70      13              smooth and                                                                heterogenous                            +----------+--------+--------+--------+---------------------+------------------+ ICA Prox  71      14              heterogenous and  irregular                               +----------+--------+--------+--------+---------------------+------------------+ ICA Distal80      12                                                      +----------+--------+--------+--------+---------------------+------------------+ ECA       87                      smooth, heterogenous                                                      and calcific                            +----------+--------+--------+--------+---------------------+------------------+ +----------+--------+--------+----------------+-------------------+           PSV cm/sEDV cm/sDescribe        Arm Pressure (mmHG) +----------+--------+--------+----------------+-------------------+ [id number]              Multiphasic, WNL                    +----------+--------+--------+----------------+-------------------+ +---------+--------+--+--------+-+---------+ VertebralPSV cm/s37EDV cm/s6Antegrade +---------+--------+--+--------+-+---------+   Summary: Right  Carotid: Velocities in the right ICA are consistent with a 1-39% stenosis. Left Carotid: Velocities in the left ICA are consistent with a 1-39% stenosis. Vertebrals:  Bilateral vertebral arteries demonstrate antegrade flow. Subclavians: Normal flow hemodynamics were seen in bilateral subclavian              arteries. *See table(s) above for measurements and observations.     Preliminary    Scheduled Meds: . acetaminophen  1,000 mg Oral Q6H  . aspirin EC  325 mg Oral Daily  . carvedilol  12.5 mg Oral BID  . clopidogrel  75 mg Oral Daily  . insulin aspart  0-5 Units Subcutaneous QHS  . insulin aspart  0-9 Units Subcutaneous TID WC  . insulin glargine  8 Units Subcutaneous QHS  . levETIRAcetam  500 mg Oral BID  . ramipril  5 mg Oral Daily  . sodium chloride flush  3 mL Intravenous Q12H   Continuous Infusions: . cefTRIAXone (ROCEPHIN)  IV 2 g (06/04/20 1708)  . lactated ringers 10 mL/hr at 06/01/20 1243    LOS: 6 days   Kerney Elbe, DO [facility] PAGER is on [place]  If 7PM-7AM, please contact night-coverage [email or web address]

## 2020-06-06 DIAGNOSIS — K8020 Calculus of gallbladder without cholecystitis without obstruction: Secondary | ICD-10-CM | POA: Diagnosis not present

## 2020-06-06 DIAGNOSIS — K831 Obstruction of bile duct: Secondary | ICD-10-CM | POA: Diagnosis not present

## 2020-06-06 DIAGNOSIS — N1831 Chronic kidney disease, stage 3a: Secondary | ICD-10-CM | POA: Diagnosis not present

## 2020-06-06 LAB — CBC WITH DIFFERENTIAL/PLATELET
Abs Immature Granulocytes: 0.29 10*3/uL — ABNORMAL HIGH (ref 0.00–0.07)
Basophils Absolute: 0 10*3/uL (ref 0.0–0.1)
Basophils Relative: 0 %
Eosinophils Absolute: 0.5 10*3/uL (ref 0.0–0.5)
Eosinophils Relative: 5 %
HCT: 27.5 % — ABNORMAL LOW (ref 36.0–46.0)
Hemoglobin: 9 g/dL — ABNORMAL LOW (ref 12.0–15.0)
Immature Granulocytes: 3 %
Lymphocytes Relative: 23 %
Lymphs Abs: 2.4 10*3/uL (ref 0.7–4.0)
MCH: 32.1 pg (ref 26.0–34.0)
MCHC: 32.7 g/dL (ref 30.0–36.0)
MCV: 98.2 fL (ref 80.0–100.0)
Monocytes Absolute: 0.8 10*3/uL (ref 0.1–1.0)
Monocytes Relative: 8 %
Neutro Abs: 6.4 10*3/uL (ref 1.7–7.7)
Neutrophils Relative %: 61 %
Platelets: 145 10*3/uL — ABNORMAL LOW (ref 150–400)
RBC: 2.8 MIL/uL — ABNORMAL LOW (ref 3.87–5.11)
RDW: 13.3 % (ref 11.5–15.5)
WBC: 10.5 10*3/uL (ref 4.0–10.5)
nRBC: 0 % (ref 0.0–0.2)

## 2020-06-06 LAB — GLUCOSE, CAPILLARY
Glucose-Capillary: 93 mg/dL (ref 70–99)
Glucose-Capillary: 165 mg/dL — ABNORMAL HIGH (ref 70–99)
Glucose-Capillary: 169 mg/dL — ABNORMAL HIGH (ref 70–99)
Glucose-Capillary: 155 mg/dL — ABNORMAL HIGH (ref 70–99)

## 2020-06-06 LAB — FERRITIN: Ferritin: 327 ng/mL — ABNORMAL HIGH (ref 11–307)

## 2020-06-06 LAB — IRON AND TIBC
Iron: 38 ug/dL (ref 28–170)
Saturation Ratios: 22 % (ref 10.4–31.8)
TIBC: 174 ug/dL — ABNORMAL LOW (ref 250–450)
UIBC: 136 ug/dL

## 2020-06-06 LAB — COMPREHENSIVE METABOLIC PANEL
ALT: 39 U/L (ref 0–44)
AST: 23 U/L (ref 15–41)
Albumin: 2 g/dL — ABNORMAL LOW (ref 3.5–5.0)
Alkaline Phosphatase: 181 U/L — ABNORMAL HIGH (ref 38–126)
Anion gap: 11 (ref 5–15)
BUN: 21 mg/dL (ref 8–23)
CO2: 19 mmol/L — ABNORMAL LOW (ref 22–32)
Calcium: 8.6 mg/dL — ABNORMAL LOW (ref 8.9–10.3)
Chloride: 113 mmol/L — ABNORMAL HIGH (ref 98–111)
Creatinine, Ser: 1.22 mg/dL — ABNORMAL HIGH (ref 0.44–1.00)
GFR, Estimated: 43 mL/min — ABNORMAL LOW (ref >60)
Glucose, Bld: 121 mg/dL — ABNORMAL HIGH (ref 70–99)
Potassium: 3.8 mmol/L (ref 3.5–5.1)
Sodium: 143 mmol/L (ref 135–145)
Total Bilirubin: 0.6 mg/dL (ref 0.3–1.2)
Total Protein: 4.9 g/dL — ABNORMAL LOW (ref 6.5–8.1)

## 2020-06-06 LAB — FOLATE: Folate: 17.3 ng/mL (ref >5.9)

## 2020-06-06 LAB — VITAMIN B12: Vitamin B-12: 461 pg/mL (ref 180–914)

## 2020-06-06 LAB — MAGNESIUM: Magnesium: 2 mg/dL (ref 1.7–2.4)

## 2020-06-06 LAB — RETICULOCYTES
Immature Retic Fract: 34.5 % — ABNORMAL HIGH (ref 2.3–15.9)
RBC.: 2.74 MIL/uL — ABNORMAL LOW (ref 3.87–5.11)
Retic Count, Absolute: 53.4 10*3/uL (ref 19.0–186.0)
Retic Ct Pct: 2 % (ref 0.4–3.1)

## 2020-06-06 LAB — PHOSPHORUS: Phosphorus: 3.6 mg/dL (ref 2.5–4.6)

## 2020-06-06 MED ORDER — ATORVASTATIN CALCIUM 40 MG PO TABS
40.0000 mg | ORAL_TABLET | Freq: Every day | ORAL | Status: DC
  Administered 2020-06-06 – 2020-06-10 (×5): 40 mg via ORAL
  Filled 2020-06-06 (×5): qty 1

## 2020-06-06 MED ORDER — ENSURE ENLIVE PO LIQD
237.0000 mL | Freq: Every day | ORAL | Status: DC
  Administered 2020-06-06 – 2020-06-10 (×5): 237 mL via ORAL

## 2020-06-06 NOTE — Progress Notes (Signed)
Ok to stop ceftriaxone (D8) per Dr. Alfredia Ferguson.  Onnie Boer, PharmD, BCIDP, AAHIVP, CPP Infectious Disease Pharmacist 06/06/2020 8:25 AM

## 2020-06-06 NOTE — Progress Notes (Signed)
Physical Therapy Treatment Patient Details Name: Taylor Castaneda MRN: [id number] DOB: Aug 29, 1932 Today's Date: 06/06/2020    History of Present Illness 87 y.o. female presenting after syncopal episode followed by seizure. RUQ ultrasound (+) cholelithiasis w/o acute cholecystitis and cirrhosis s/p ERCP on 12/28 by Dr. Lyndel Safe. Patient also found to have AKI on CKD. CT head (-) for acute findings. Evidence of mild chronic small vessel ischemic disease and moderately advanced cerebral atrophy. MRI (+) intracranial stenosis and multiple new punctate foci of acute ischemia within the right centrum  semiovale. PMHx significant for DMII, HLD, seizures, and Hx colon CA s/p resection.    PT Comments    Patient progressing slowly towards PT goals. Improved ambulation distance with Min A for balance/safety. Continues to have unsteady gait especially without UE support and when using RW, noted to be veering left and running into things in environment without awareness; assist needed to correct on multiple occasions. Pt with difficulty expressing self in the moment and some word finding difficulties noted. Has difficulty following multi step commands and poor awareness of deficits. Continues to be a good CIR candidate. Will follow.   Follow Up Recommendations  CIR     Equipment Recommendations  Rolling walker with 5" wheels    Recommendations for Other Services       Precautions / Restrictions Precautions Precautions: Fall Restrictions Weight Bearing Restrictions: No    Mobility  Bed Mobility               General bed mobility comments: Up in chair upon PT arrival.  Transfers Overall transfer level: Needs assistance Equipment used: None Transfers: Sit to/from Stand Sit to Stand: Min assist         General transfer comment: Min A to steady in standing with pt reaching for bed rail for support. Stood from Youth worker.  Ambulation/Gait Ambulation/Gait assistance: Min assist Gait  Distance (Feet): 150 Feet Assistive device: Rolling walker (2 wheeled);None Gait Pattern/deviations: Step-through pattern;Decreased stride length;Trunk flexed;Staggering left Gait velocity: reduced Gait velocity interpretation: <1.8 ft/sec, indicate of risk for recurrent falls General Gait Details: Slow, unsteady gait without use of DME with pt reaching for items to hold onto in room; used RW for hallway ambulation and pt veering left and running into things on left side of environment with little awareness/assist needed to fix. 2/4 DOE. Poor ability to self monitor activity tolerance.   Stairs             Wheelchair Mobility    Modified Rankin (Stroke Patients Only) Modified Rankin (Stroke Patients Only) Pre-Morbid Rankin Score: No significant disability Modified Rankin: Moderately severe disability     Balance Overall balance assessment: Needs assistance Sitting-balance support: Feet supported;No upper extremity supported Sitting balance-Leahy Scale: Fair Sitting balance - Comments: close supervision for safety   Standing balance support: During functional activity Standing balance-Leahy Scale: Poor Standing balance comment: Requires UE support vs external support for standing.               High Level Balance Comments: Pt's son present during session. Not able to get HR reading on pt as batteries dead.            Cognition Arousal/Alertness: Awake/alert Behavior During Therapy: WFL for tasks assessed/performed Overall Cognitive Status: Impaired/Different from baseline Area of Impairment: Awareness;Problem solving                           Awareness: Intellectual Problem Solving: Slow  processing;Requires verbal cues General Comments: Has difficutlies in the moment expressing self.      Exercises      General Comments        Pertinent Vitals/Pain Pain Assessment: No/denies pain    Home Living                      Prior  Function            PT Goals (current goals can now be found in the care plan section) Progress towards PT goals: Progressing toward goals (slowly)    Frequency    Min 4X/week      PT Plan Current plan remains appropriate    Co-evaluation              AM-PAC PT "6 Clicks" Mobility   Outcome Measure  Help needed turning from your back to your side while in a flat bed without using bedrails?: A Little Help needed moving from lying on your back to sitting on the side of a flat bed without using bedrails?: A Little Help needed moving to and from a bed to a chair (including a wheelchair)?: A Little Help needed standing up from a chair using your arms (e.g., wheelchair or bedside chair)?: A Little Help needed to walk in hospital room?: A Little Help needed climbing 3-5 steps with a railing? : A Lot 6 Click Score: 17    End of Session Equipment Utilized During Treatment: Gait belt Activity Tolerance: Patient tolerated treatment well Patient left: in chair;with call bell/phone within reach;with chair alarm set;with family/visitor present Nurse Communication: Mobility status PT Visit Diagnosis: Unsteadiness on feet (R26.81);Muscle weakness (generalized) (M62.81);Other symptoms and signs involving the nervous system (D78.242)     Time: 3536-1443 PT Time Calculation (min) (ACUTE ONLY): 13 min  Charges:  $Gait Training: 8-22 mins                     Marisa Severin, PT, DPT Acute Rehabilitation Services Pager [phone] Office [place] 06/06/2020, 1:06 PM

## 2020-06-06 NOTE — Progress Notes (Signed)
PROGRESS NOTE    Taylor Castaneda  RFX:[id number] DOB: [birth year] DOA: 05/30/2020 PCP: [name], MD   Brief Narrative:  HPI per Dr. Fuller Plan on 05/30/20 Taylor Castaneda is a 87 y.o. female with medical history significant of diabetes mellitus type 2, hyperlipidemia, seizure disorder, history colon cancer s/p resection, and anemia presented with complaints of passing out while at her condo.  The patient's daughter found her on the floor and was able to help her get to a chair.  However when EMS arrived patient had a witnessed generalized tonic-clonic seizure with urinary incontinence lasting approximately 1 minute prior to self resolving.  They did not have to give any abortive medications.  She had been compliant with her medications and last reported having a seizure over a year ago.  At baseline patient lives alone and handles all of her own medications.  Patient had not been feeling well earlier in the day with complaints of epigastric abdominal pain with radiation to her back.  Denied any recent trauma prior to onset of symptoms.  Symptoms lasted approximately 6 to 7 hours prior to self resolving.  She had taken Tums and Tylenol prior to coming into the hospital. CT scan of the head and cervical spine did not note any acute abnormalities.  CT imaging of the thoracic and lumbar spine noted severe spinal stenosis at L4-5 and large nonobstructing left renal stone.  Bowel sounds were noted to be stable.  Labs at the outside facility yesterday had been significant for WBC 17.8, hemoglobin 11.4, BUN 37, creatinine 1.4, alkaline phosphatase 283, AST 315, ALT 235, total bilirubin 3.3 and lactic acid initially 4->2.6.  COVID-19 and influenza screening was negative.  Right upper quadrant ultrasound revealed cholelithiasis without evidence of acute cholecystitis and cirrhosis.  Case had been discussed with GI here at [facility] who recommended MRCP which revealed cholelithiasis without signs of acute  cholecystitis, suspected small common bile duct stones distally with no duct dilatation, and pancreatic atrophy with dilatation in the mid body tail region with abrupt transition point concerning for possible stricture.  She had been given Keppra 1000 mg IV and 500 mL of normal saline IV fluids.  Patient had been accepted to the hospitalist service to a medical telemetry bed.  Repeat labs revealed WBC 25.6, hemoglobin 10.7, alkaline phosphatase 270, AST 219, ALT 207, and total bilirubin 3.7, lactic acid 2.2.  **Interim History  Patient underwent an ERCP and she had a periampullary diverticulum and she was noted to have choledocholithiasis which is status post biliary sphincterotomy and sphincteroplasty and balloon extraction.  She had cholelithiasis without cholecystitis and she had a mild gastritis.  Pancreatogram was not obtained and GI recommended watching for pancreatitis, bleeding or perforation and cholangitis.  Recommend a clear liquid diet and continue current medications and continue antibiotics for 5 days total and stopping the Flagyl.  He also recommended obtaining a surgical consultation while inpatient for questionable lap chole and following the results of the CA 19-9, CEA, IgG for.  Dr. Lyndel Safe feels that she will require an EUS as an outpatient for evaluation of pancreatic duct stricture.  After procedure she was sedated and very somnolent and drowsy and unable to be aroused given that she was asleep  General surgery was consulted on 05/31/2020 and after discussion with the patient they recommended a cholecystectomy which the patient was agreeable to.  Patient underwent a cholecystectomy on 06/01/2020 and is doing well postoperatively.  This morning she is slated to be  discharged however started having some word finding difficulties and could not articulate her words properly.  This was an acute change from her baseline and daughter was at bedside.  When I went to evaluate her if she was talking  normally but did also have some disarticulation of her words and after discussion with neurology a code stroke was called.  Neurology evaluated and symptoms had resolved but they felt she still needed further imaging so we are obtaining MRI of the brain and she was transferred to [address] for neuro checks closely.  MRI done and showed multiple punctate foci of acute ischemia within the right centrum semiovale with no hemorrhages or mass-effect seen.  Neurology recommended full stroke work-up and she ended up going for an MRA of the head without contrast, echocardiogram as well as Carotid Dopplers.  Neurology felt that she had an acute CVA versus subacute CVA and recommended aspirin and Plavix 325 mg and 75 mg p.o. respectively for 3 months and then just Plavix alone given her severe intracranial stenosis.  PT OT now recommending CIR so we have consulted to CIR for further evaluation and placement at inpatient rehab  06/04/2020 orthostatic vital signs were done and she did not meet orthostatic vital sign requirement but she did drop from lying to sitting to standing and were from 172/60 from lying and standing and went to 158/60. Currently awaiting CIR evaluation but she feels better today  06/05/2020 she is not orthostatic yesterday and LFTs have improved and her white blood cell count has resolved.  Remains on dual antiplatelet therapy and will consider resuming her statin now that her LFTs are improved.  Rest of her labs appear stable and blood count is stable as well.  We will have CIR evaluate and consult has been placed.  06/06/2020: LFTS resolved so will resume statin. Stable to D/C to CIR when bed is available  Assessment & Plan:   Principal Problem:   Cholelithiasis Active Problems:   Hyperlipidemia associated with type 2 diabetes mellitus (HCC)   CKD (chronic kidney disease), stage III (HCC)   Hyperbilirubinemia   Normocytic anemia   Transaminitis   Pancreatic duct dilated   Common bile duct  (CBD) obstruction   Cerebral thrombosis with cerebral infarction   Cholelithiasis and choledocholithiasis status post ERCP on 05/31/2020 and now cholecystectomy postoperative day 4 Obstructive Jaundice with Hyperbilirubinemia and Abnormal LFTs, improving and resolved -Acute.  Patient present with complaints of abdominal pain with radiation to her back.   -Initial CT imaging studies significant for cholelithiasis without signs of cholecystitis and cirrhosis.   -GI has been initially consulted and MRCP was recommended.  MRCP revealed cholelithiasis without cholecystitis, small gallstones distally without clear duct dilatation, pancreatic duct dilatation in the mid body region with an abrupt transition, but no obvious obstructing lesion.   -Patient transferred for need of ERCP and this was done on 05/31/2020 -Admit to a medical telemetry -Check blood cultures and showed NGTD at 5 days -Diet had been advanced but she was made n.p.o. when she started having difficulty with her speech and word finding -Start empiric antibiotics of Rocephin and metronidazole and will now stop Abx; GI recommending stopping Flagyl  -Follow up on CA19-9 was less than 2, CEA was 3.2, IgG4 was 49 -[place] consult, will follow-up for further recommendations -Underwent ERCP today and she had a periampullary diverticulum and she was noted to have choledocholithiasis which is status post biliary sphincterotomy and sphincteroplasty and balloon extraction.  She had  cholelithiasis without cholecystitis and she had a mild gastritis.   -Pancreatogram was not obtained and GI recommended watching for pancreatitis, bleeding or perforation and cholangitis.   -Dr. Lyndel Safe Recommend a clear liquid diet and continue current medications and continue antibiotics for 5 days total and stopping the Flagyl. He also recommended obtaining a surgical consultation while inpatient for questionable lap chole and following the results of the CA 19-9, CEA,  IgG4. -T Bili went from 3.7 and its peak is now 0.6 -General Surgery consulted for further evaluation and they they discussed the possibility of cholecystectomy with the patient.  She is agreeable to surgical intervention and decided to proceed with cholecystectomy and this was done on 06/01/2020 -Will need PT/OT to evaluate and Treat to determine safe discharge disposition and this can be done after her surgery -Surgery has evaluated patient and cleared her for discharge and PT OT recommending home health PT and OT initially but now recommending CIR and will consult CIR for assistance with placement  Expressive Aphasia in the setting of acute versus subacute CVA the setting of large vessel disease -Patient had significant difficulty time finding her words on 06/02/2020 and when she did she repeat herself and kept saying "free" -When I went to evaluate her she was improving but still had some difficulty articulating her words -Spoke with neurology who recommended calling a code stroke and a stat head CT was ordered but when she was evaluated by neurology her symptoms resolved so code stroke was canceled and MRI was ordered -Patient was transferred to [address] for continuing neurochecks every 2 hours  -Continue monitor carefully and follow-up with neurology recommendations and follow-up on MRI -MRI was done and showed "Multiple punctate foci of acute ischemia within the right centrum semiovale. No hemorrhage or mass effect." -MRA of the Head w/o Contrast was recommended and showed "Advanced intracranial atherosclerosis. No large vessel occlusion identified, but  Severe stenosis at the Right MCA bifurcation, with long segment involvement of the superior M2. Severe stenosis distal Left ICA siphon and Left MCA superior M2 branch origin. Moderate stenosis Right PCA P2 segment." -Carotid Dopplers done and showed "Velocities in the right ICA are consistent with a 1-39% stenosis.   Left Carotid: Velocities in the  left ICA are consistent with a 1-39% stenosis." -ECHOCardiogram done and showed EF of 60 to 65% with grade 2 status function noted within right ventricular systolic function with mildly reduced with no mention of thrombus noted -EEG will be ordered to rule out Seizures showed that the study was suggestive of cortical dysfunction in the right more than left temporal region with nonspecific etiology with no seizures or epileptiform discharges seen throughout the recording -Surgery evaluated and felt that her speech is worse when she stands up; Will continue to observe and get Orthostatics; Will need TED Hose; Repeat Orthostatic done and she was not orthostatic today and TED hose were ordered -SLP evaluated and reccommending Home Health SLP -Neurology recommending ASA 325 mg po Daily and Plavix 75 mg po Daily for 3 months then Plavix along given her sever Intracranial Stenosis -Statin Held due to Abnormal LFTs and will resume now in the outpatient setting with Dr. Leta Baptist -PT/OT now recommending CIR so will consult Rehab for Screening and MD for Evaluation to CIR -Follow-up on neurology recommendations and they have signed off the case; will consider resuming her statin now that her LFTs are improved and if they are stable for few days will resume tomorrow  Pancreatic Duct Dilatation  -  As seen above.  Lipase was limits and no significant signs of inflammation noted on imaging. -CT of the abdomen with and without contrast -Further Care Per GI -Dr. Lyndel Safe feels that she will require an EUS as an outpatient for evaluation of pancreatic duct stricture as she did not have a Pancreatogram done here  Leukocytosis and lactic acidosis, improving -Acute.  -WBC elevated from 17.8 to 25.6 with lactic acid initially elevated up to 4, but slowly trending down.  -Patient vital signs were noted to be relatively within normal limits not necessarily meeting sepsis criteria. -WBC is now resolved and 10.5 -LA is  trending down and LA was last 2.4 -Follow-up blood cultures and pending  -Check urinalysis and culture if necessary -She is on Abx and on IV Ceftriaxone 2 g q24h -IV fluid hydration is now -Trend lactic acid levels  Seizure Disorder -Patient had witnessed generalized tonic-clonic seizure lasting 1 minute with continence of urine prior to admission.   -Suspect this could have been the reason patient had fallen on the floor as well.   -Home medications include Keppra 250 mg twice daily. Patient had received 1000 mg IV while hospitalized. -Seizure precaution  -Continued home regimen of Keppra initially but Neurology has increased Keppra to 500 mg po BID -She evaluated and she follows up with Dr. Andrey Spearman and [facility] and because of this admission she is found at home with tremorous activity and EEG showed cortical dysfunction.  Neurology thinks that she could have had some cerebral hypoperfusion with orthostatic hypotension so we will recheck orthostatics in the morning -MRI of the brain was obtained and above  Essential Hypertension but now with Orthostatic Hypotension -Home blood pressure medications include Altace 5 mg daily, Coreg 12.5 mg twice daily, and furosemide 20 mg daily. -Continue to Hold furosemide but resumed Ramipril 5 mg po Daily -Continue Carvedilol 12.5 mg po BID -Continue to Monitor BP per Protocol  -Repeat Orthostatic VS pending  -Last blood pressure was 150/79 -Continue with TED hose  HX of A Fib -Currently in NSR -Not on Anticoagulation due to Fall risk -Follow up with Cardiology as an outpateint and Neurology does not feel she has had an embolic stroke  Acute kidney injury superimposed on chronic kidney disease IIIb Metabolic Acidosis -Creatinine was initially noted to be 1.4 (around patient's baseline), but repeat check today 1.76 with BUN 34 on admission; Now BUN/Cr stable and much improved to 26/1.20 -Check CK and was 75 -Patient has a small acidosis now  with a CO2 of 19, chloride level 113, and anion gap of 11 -IV fluid hydration is now stopped -Avoid nephrotoxic medications, contrast dyes, hypotension and renally dose medications -Repeat CMP in a.m.  Normocytic Anemia -Acute.     -Hemoglobin/hematocrit is relatively stable at 9.0/27.5 -Check anemia panel and showed an iron level of 50, TIBC 105, TIBC 155, saturation ratio 32%, ferritin level of 393, folate of 21.4, and vitamin B12 of 588 -Continue to monitor for signs and symptoms of bleeding; currently no overt bleeding noted -Repeat CBC in a.m.  Diabetes Mellitus Type 2, uncontrolled -On admission glucose elevated up to 205.  Last hemoglobin A1c noted to be 8.7 on 02/12/2020.  Home medication includes Lantus 16 units nightly. -Hypoglycemic protocol -Check hemoglobin A1c was 7.9 -Continue half dose of Lantus -CBGs before every meal with moderate SSI  -Diabetes education coronary is following closely -CBGs ranging from 93-165  Abnormal LFTs and Hyperbilirubinemia, slowly improving -Acute.  Patient found to have AST 315-> 23  today and is resolved,  -ALT 235-> 43 and is resolved, and total bilirubin 3.3->3.7 -> 0.6. -Continue to monitor and trend Hepatic Fxn Panel -Repeat CMP in the AM  Normocytic Anemia -Patient's hemoglobin/hematocrit has gone from 10.9/32.1 -> 9.4/29.1 -> 9.8/30.3 -> 9.4/27.5 -> 9.0/27.5 -Checked Anemia Panel and showed iron level of 38, TIBC 136, TIBC 174, saturation ratios of 22%, ferritin level 327, folate level 17.3, vitamin B12 461 -Continue to Monitor for S/Sx of Bleeding; Currently no overt bleeding noted -Repeat CBC in the AM   Thrombocytopenia -Patient's Platelet count has gone from 113 -> 124 -> 119 -> 123 -> 145 -Continue to Monitor and Trend and Continue to Monitor for S/Sx of Bleeding -Repeat CBC in the AM   Elevated Alk Phos -Was elevated to 282 on admission is trended down and is now 181 -Continue to monitor and trend repeat CMP in  a.m.  Hyperlipidemia -Held Atorvastatin due to elevated liver enzymes and will resume now   DVT prophylaxis: SCDs; Will add Heparin 5,000 units sq q8h Code Status: FULL CODE Family Communication: Spoke with son at the bedside Disposition Plan: Has been cleared from a gastroenterology, general surgery as well as a neurologic perspective.  She will need to go to CIR if accepted versus SNF as she appears medically stable to be discharged now  Status is: Inpatient  Remains inpatient appropriate because:Unsafe d/c plan, IV treatments appropriate due to intensity of illness or inability to take PO and Inpatient level of care appropriate due to severity of illness   Dispo: The patient is from: Home              Anticipated d/c is to: CIR              Anticipated d/c date is: 1 day              Patient currently is medically stable to d/c.  Consultants:   Gastroenterology  General Surgery    Procedures:  ERCP Findings:      The scout film was normal. The upper GI tract was traversed under direct       vision without detailed examination. Mild antral gastritis was noted.      The major papilla was located at lower lip of periampullary diverticula.       The bile duct was deeply cannulated with the short-nosed traction       sphincterotome. Contrast was injected. I personally interpreted the bile       duct images. There was brisk flow of contrast through the ducts. Image       quality was adequate. Contrast extended to the entire biliary tree. 3       filling defects c/w choledocholithiasis was found in CBD which was       mildly dilated to 10 mm. Low insertion of cystic duct was noted. Cystic       duct did fill. Multiple filling defects in the gallbladder consistent       with cholelithiasis.      A 6 mm biliary sphincterotomy was made with a monofilament traction       (standard) sphincterotome using ERBE electrocautery at 12 o'clock       position. There was no  post-sphincterotomy bleeding. Due to       periampullary diverticula, we elected to proceed with sphincteroplasty.       Sphincteroplasty was performed using 03-15-11 mm x 5.5 cm CRE balloon to  a maximum balloon size of 10 mm. The biliary tree was swept multiple       times with a 12 mm balloon starting at the bifurcation. All 3 stones       were removed. Nothing was found on postocclusion cholangiogram. The bile       was green. There was no pus.      Pancreatogram was not obtained. We did try few times but the wire would       not find pancreatic duct after the sphincterotomy.      Mild antral gastritis was biopsied with a cold forceps for histology to       r/o HP. Impression:               -Periampullary diverticulum.                           -Choledocholithiasis s/p biliary sphincterotomy,                            sphincteroplasty and balloon extraction.                           -Cholelithiasis without cholecystitis.                           -Mild gastritis.                           -Pancreatogram not obtained. Recommendation:           - Watch for pancreatitis, bleeding, perforation,                            and cholangitis.                           - Clear liquid diet.                           - Continue present medications.                           - Continue antibiotics x total of 5 days. Can stop                            Flagyl.                           - Can obtain surgical consultation while inpatient                            ?Lap chole.                           - Follow results of CA 19?9/CEA/IgG4. I do believe                            she will require EUS as an outpatient for  evaluation of PD stricture. I will run it by Dr.                            Wendie Agreste. Mansouraty                           - The findings and recommendations were discussed                            with the patient's family.  Laparoscopic  cholecystectomy was performed by Dr. Romana Juniper on 06/01/2020  Antimicrobials: Anti-infectives (From admission, onward)   Start     Dose/Rate Route Frequency Ordered Stop   05/30/20 1615  cefTRIAXone (ROCEPHIN) 2 g in sodium chloride 0.9 % 100 mL IVPB  Status:  Discontinued        2 g 200 mL/hr over 30 Minutes Intravenous Every 24 hours 05/30/20 1516 06/06/20 0825   05/30/20 1615  metroNIDAZOLE (FLAGYL) tablet 500 mg  Status:  Discontinued        500 mg Oral Every 8 hours 05/30/20 1516 05/31/20 1611        Subjective: Seen And examined at bedside she is doing well sitting in the chair at bedside.  Denies pain or nausea or vomiting.  Felt okay.  Slept okay.  No other concerns or complaints at this time and ready to go to inpatient rehab when bed is available.  Objective: Vitals:   06/06/20 0444 06/06/20 0757 06/06/20 0827 06/06/20 1147  BP: (!) 179/59 (!) 187/71 (!) 177/69 (!) 150/79  Pulse: 68 63  62  Resp: _0 Temp: 97.6 F (36.4 C) 97.9 F (36.6 C)  (!) 97.4 F (36.3 C)  TempSrc: Oral Oral  Oral  SpO2: 100% 99%  100%  Weight:      Height:        Intake/Output Summary (Last 24 hours) at 06/06/2020 1312 Last data filed at 06/05/2020 2300 Gross per 24 hour  Intake 480 ml  Output 2 ml  Net 478 ml   Filed Weights   05/30/20 1645 05/31/20 0912 06/01/20 1045  Weight: 50.8 kg 50.8 kg 50.8 kg   Examination: Physical Exam:  Constitutional: WN/WD elderly Caucasian female currently in no acute distress sitting in the chair bedside appears calm and comfortable Eyes: Lids and conjunctivae normal, sclerae anicteric  ENMT: External Ears, Nose appear normal. Grossly normal hearing.  Neck: Appears normal, supple, no cervical masses, normal ROM, no appreciable thyromegaly; no JVD Respiratory: Diminished to auscultation bilaterally, no wheezing, rales, rhonchi or crackles. Normal respiratory effort and patient is not tachypenic. No accessory muscle use.  Unlabored  breathing Cardiovascular: RRR, no murmurs / rubs / gallops. S1 and S2 auscultated.  No appreciable lower extremity edema Abdomen: Soft, non-tender, non-distended. Bowel sounds positive.  GU: Deferred. Musculoskeletal: No clubbing / cyanosis of digits/nails. No joint deformity upper and lower extremities.  Skin: No rashes, lesions, ulcers on limited skin evaluation. No induration; Warm and dry.  Neurologic: CN 2-12 grossly intact with no focal deficits. Romberg sign and cerebellar reflexes not assessed.  Psychiatric: Normal judgment and insight. Alert and oriented x 3. Normal mood and appropriate affect.   Data Reviewed: I have personally reviewed following labs and imaging studies  CBC: Recent Labs  Lab 06/01/20 0035 06/02/20 0022 06/03/20 0330 06/04/20 0213 06/05/20 0327 06/06/20 0116  WBC 13.2* 12.1*  12.0* 11.4* 10.1 10.5  NEUTROABS 11.6*  --  8.4* 7.9* 6.6 6.4  HGB 10.0* 10.9* 9.4* 9.8* 9.4* 9.0*  HCT 29.8* 32.1* 29.1* 30.3* 27.5* 27.5*  MCV 98.3 96.7 99.7 98.7 97.5 98.2  PLT 113* 124* 119* 123* 143* 244*   Basic Metabolic Panel: Recent Labs  Lab 06/01/20 0035 06/02/20 0022 06/03/20 0330 06/04/20 0213 06/05/20 0327 06/06/20 0116  NA 142 140 142 142 143 143  K 4.8 4.8 4.2 4.0 3.9 3.8  CL 112* 111 114* 114* 115* 113*  CO2 18* 18* 19* 19* 19* 19*  GLUCOSE 255* 243* 127* 103* 178* 121*  BUN 33* 37* 36* 28* 26* 21  CREATININE 1.65* 1.64* 1.67* 1.18* 1.20* 1.22*  CALCIUM 7.8* 7.6* 7.9* 8.4* 8.5* 8.6*  MG 1.8 1.9 1.9 1.9 2.0 2.0  PHOS 4.3  --  2.6 3.0 3.3 3.6   GFR: Estimated Creatinine Clearance: 23 mL/min (A) (by C-G formula based on SCr of 1.22 mg/dL (H)). Liver Function Tests: Recent Labs  Lab 06/02/20 0022 06/03/20 0330 06/04/20 0213 06/05/20 0327 06/06/20 0116  AST 70* 53* 43* 25 23  ALT 89* 69* 56* 43 39  ALKPHOS 242* 192* 200* 201* 181*  BILITOT 1.4* 0.9 0.8 0.7 0.6  PROT 5.7* 4.9* 5.0* 5.0* 4.9*  ALBUMIN 2.4* 2.3* 2.3* 2.1* 2.0*   No results for  input(s): LIPASE, AMYLASE in the last 168 hours. No results for input(s): AMMONIA in the last 168 hours. Coagulation Profile: No results for input(s): INR, PROTIME in the last 168 hours. Cardiac Enzymes: Recent Labs  Lab 05/30/20 1717  CKTOTAL 75   BNP (last 3 results) No results for input(s): PROBNP in the last 8760 hours. HbA1C: No results for input(s): HGBA1C in the last 72 hours. CBG: Recent Labs  Lab 06/05/20 1223 06/05/20 1607 06/05/20 2217 06/06/20 0638 06/06/20 1149  GLUCAP 182* 117* 88 93 165*   Lipid Profile: No results for input(s): CHOL, HDL, LDLCALC, TRIG, CHOLHDL, LDLDIRECT in the last 72 hours. Thyroid Function Tests: No results for input(s): TSH, T4TOTAL, FREET4, T3FREE, THYROIDAB in the last 72 hours. Anemia Panel: Recent Labs    06/06/20 0116  VITAMINB12 461  FOLATE 17.3  FERRITIN 327*  TIBC 174*  IRON 38  RETICCTPCT 2.0   Sepsis Labs: Recent Labs  Lab 05/30/20 1516 05/30/20 1655  LATICACIDVEN 1.9 2.4*    Recent Results (from the past 240 hour(s))  Resp Panel by RT-PCR (Flu A&B, Covid) Nasopharyngeal Swab     Status: None   Collection Time: 05/29/20  9:49 PM   Specimen: Nasopharyngeal Swab; Nasopharyngeal(NP) swabs in vial transport medium  Result Value Ref Range Status   SARS Coronavirus 2 by RT PCR NEGATIVE NEGATIVE Final    Comment: (NOTE) SARS-CoV-2 target nucleic acids are NOT DETECTED.  The SARS-CoV-2 RNA is generally detectable in upper respiratory specimens during the acute phase of infection. The lowest concentration of SARS-CoV-2 viral copies this assay can detect is 138 copies/mL. A negative result does not preclude SARS-Cov-2 infection and should not be used as the sole basis for treatment or other patient management decisions. A negative result may occur with  improper specimen collection/handling, submission of specimen other than nasopharyngeal swab, presence of viral mutation(s) within the areas targeted by this assay,  and inadequate number of viral copies(<138 copies/mL). A negative result must be combined with clinical observations, patient history, and epidemiological information. The expected result is Negative.  Fact Sheet for Patients:  EntrepreneurPulse.com.au  Fact Sheet for Healthcare Providers:  IncredibleEmployment.be  This test is no t yet approved or cleared by the [place] and  has been authorized for detection and/or diagnosis of SARS-CoV-2 by FDA under an Emergency Use Authorization (EUA). This EUA will remain  in effect (meaning this test can be used) for the duration of the COVID-19 declaration under Section 564(b)(1) of the Act, 21 U.S.C.section 360bbb-3(b)(1), unless the authorization is terminated  or revoked sooner.       Influenza A by PCR NEGATIVE NEGATIVE Final   Influenza B by PCR NEGATIVE NEGATIVE Final    Comment: (NOTE) The Xpert Xpress SARS-CoV-2/FLU/RSV plus assay is intended as an aid in the diagnosis of influenza from Nasopharyngeal swab specimens and should not be used as a sole basis for treatment. Nasal washings and aspirates are unacceptable for Xpert Xpress SARS-CoV-2/FLU/RSV testing.  Fact Sheet for Patients: EntrepreneurPulse.com.au  Fact Sheet for Healthcare Providers: IncredibleEmployment.be  This test is not yet approved or cleared by the [place] FDA and has been authorized for detection and/or diagnosis of SARS-CoV-2 by FDA under an Emergency Use Authorization (EUA). This EUA will remain in effect (meaning this test can be used) for the duration of the COVID-19 declaration under Section 564(b)(1) of the Act, 21 U.S.C. section 360bbb-3(b)(1), unless the authorization is terminated or revoked.  Performed at [facility], [address] [place], [place] [address]   Culture, blood (x 2)     Status: None   Collection Time: 05/30/20  5:00 PM    Specimen: BLOOD RIGHT ARM  Result Value Ref Range Status   Specimen Description BLOOD RIGHT ARM  Final   Special Requests   Final    BOTTLES DRAWN AEROBIC ONLY Blood Culture results may not be optimal due to an inadequate volume of blood received in culture bottles   Culture   Final    NO GROWTH 5 DAYS Performed at [facility], [place] [address], [place], [place] [address]    Report Status 06/04/2020 FINAL  Final  Culture, blood (x 2)     Status: None   Collection Time: 05/30/20  5:03 PM   Specimen: BLOOD RIGHT ARM  Result Value Ref Range Status   Specimen Description BLOOD RIGHT ARM  Final   Special Requests   Final    BOTTLES DRAWN AEROBIC ONLY Blood Culture results may not be optimal due to an inadequate volume of blood received in culture bottles   Culture   Final    NO GROWTH 5 DAYS Performed at [facility], [place] [address], [place], [place] [address]    Report Status 06/04/2020 FINAL  Final  Surgical pcr screen     Status: None   Collection Time: 05/31/20  8:24 PM   Specimen: Nasal Mucosa; Nasal Swab  Result Value Ref Range Status   MRSA, PCR NEGATIVE NEGATIVE Final   Staphylococcus aureus NEGATIVE NEGATIVE Final    Comment: (NOTE) The Xpert SA Assay (FDA approved for NASAL specimens in patients 57 years of age and older), is one component of a comprehensive surveillance program. It is not intended to diagnose infection nor to guide or monitor treatment. Performed at [facility], [place] [address] [place], [place] [address]      RN Pressure Injury Documentation:     Estimated body mass index is 22.62 kg/m as calculated from the following:   Height as of this encounter: _0  (1.499 m).   Weight as of this encounter: 50.8 kg.  Malnutrition  Type:  Nutrition Problem: Inadequate oral intake Etiology: altered GI function   Malnutrition Characteristics:  Signs/Symptoms: NPO status   Nutrition Interventions:  Interventions: Refer to RD note  for recommendations   Radiology Studies: No results found. Scheduled Meds: . acetaminophen  1,000 mg Oral Q6H  . aspirin EC  325 mg Oral Daily  . carvedilol  12.5 mg Oral BID  . clopidogrel  75 mg Oral Daily  . heparin injection (subcutaneous)  5,000 Units Subcutaneous Q8H  . insulin aspart  0-5 Units Subcutaneous QHS  . insulin aspart  0-9 Units Subcutaneous TID WC  . insulin glargine  8 Units Subcutaneous QHS  . levETIRAcetam  500 mg Oral BID  . ramipril  5 mg Oral Daily  . sodium chloride flush  3 mL Intravenous Q12H   Continuous Infusions: . lactated ringers 10 mL/hr at 06/01/20 1243    LOS: 7 days   Kerney Elbe, DO [facility] PAGER is on AMION  If 7PM-7AM, please contact night-coverage [email or web address]

## 2020-06-06 NOTE — Plan of Care (Signed)
  Problem: Safety: Goal: Ability to remain free from injury will improve Outcome: Progressing   Problem: Skin Integrity: Goal: Risk for impaired skin integrity will decrease Outcome: Progressing   Problem: Education: Goal: Required Educational Video(s) Outcome: Progressing   Problem: Ischemic Stroke/TIA Tissue Perfusion: Goal: Complications of ischemic stroke/TIA will be minimized Outcome: Progressing   Problem: Intracerebral Hemorrhage Tissue Perfusion: Goal: Complications of Intracerebral Hemorrhage will be minimized Outcome: Progressing   Problem: Spontaneous Subarachnoid Hemorrhage Tissue Perfusion: Goal: Complications of Spontaneous Subarachnoid Hemorrhage will be minimized Outcome: Progressing

## 2020-06-06 NOTE — Progress Notes (Signed)
Nutrition Follow-up  DOCUMENTATION CODES:   Not applicable  INTERVENTION:  Provide Ensure Enlive po once daily, each supplement provides 350 kcal and 20 grams of protein  Encourage adequate PO intake.   NUTRITION DIAGNOSIS:   Inadequate oral intake related to altered GI function as evidenced by NPO status; diet advanced; improved  GOAL:   Patient will meet greater than or equal to 90% of their needs; progressing  MONITOR:   PO intake,Supplement acceptance,Skin,Weight trends,Labs,I & O's  REASON FOR ASSESSMENT:   Malnutrition Screening Tool    ASSESSMENT:   Taylor Castaneda is a 87 y.o. female with medical history significant of diabetes mellitus type 2, hyperlipidemia, seizure disorder, history colon cancer s/p resection, and anemia presented with complaints of passing out while at her condo. Pt admitted with choledocholithiasis.   12/28 - s/p ERCP  12/30 - Expressive Aphasia in the setting of acute versus subacute CVA the setting of large vessel disease  Pt unavailable during attempted time of contact. Diet has been advanced to a carb modified diet with thin liquids. Meal completion has been 85-90%. Pt has been tolerating her po diet. RD to order nutritional supplementation to aid in caloric and protein needs. Unable to complete Nutrition-Focused physical exam at this time.   Labs and medications reviewed.   Diet Order:   Diet Order            Diet Carb Modified Fluid consistency: Thin; Room service appropriate? Yes  Diet effective now                 EDUCATION NEEDS:   No education needs have been identified at this time  Skin:  Skin Assessment: Skin Integrity Issues: Skin Integrity Issues:: Incisions Incisions: abdomen  Last BM:  1/2  Height:   Ht Readings from Last 1 Encounters:  06/01/20 4\' 11"  (1.499 m)    Weight:   Wt Readings from Last 1 Encounters:  06/01/20 50.8 kg    Ideal Body Weight:  44.5 kg  BMI:  Body mass index is 22.62  kg/m.  Estimated Nutritional Needs:   Kcal:  1350-1550  Protein:  65-80 grams  Fluid:  > 1.3 L   Corrin Parker, MS, RD, LDN RD pager number/after hours weekend pager number on Amion.

## 2020-06-06 NOTE — PMR Pre-admission (Signed)
PMR Admission Coordinator Pre-Admission Assessment  Patient: Taylor Castaneda is an 87 y.o., female MRN: [id number] DOB: 1932-09-12 Height: '4\' 11"'  (149.9 cm) Weight: 50.8 kg  Insurance Information HMO: Yes    PPO:      PCP:      IPA:      80/20:      OTHER:  PRIMARY: BCBS Medicare      Policy#: [id number]      Subscriber: patient CM Name:  Larene Beach   Phone#: [phone]    Fax#: [phone] Pre-Cert#: [id number]     Employer:  Approval for CIR received from [facility] on 06/08/20.  approved for 7 days with f/u to [place]  Benefits:  Phone #: [phone]     Name: Eff. Date: 06/04/2020-still active     Deduct: $0 (does not have)      Out of Pocket Max: $4,200 ($0 met)      Life Max: NA CIR: $335/per visit co-pay     SNF: 100% coverage, 0% co-insurance days 1-20; $188/per day co-pay days 21-100 Outpatient: $40 co-pay per visit; limited by medical necessity     Co-Pay:  Home Health: 100%  Coverage; limited by medical necessity      Co-Pay: 0% DME: 80% coverage     Co-Pay: 20% co-insurance Providers: in-network SECONDARY:       Policy#:      Phone#:   Development worker, community:       Phone#:   The Engineer, petroleum" for patients in Inpatient Rehabilitation Facilities with attached "Privacy Act [place] Records" was provided and verbally reviewed with: Patient and Family  Emergency Contact Information Contact Information    Name Relation Home Work Mobile   Burdett Daughter [phone]  [phone]   Gaynell Face [phone]        Current Medical History  Patient Admitting Diagnosis: cerebral thrombosis with cerebral infarction and s/p cholelithiasis  History of Present Illness: Pt is an 87 year old female with medical hx significant for: DM II, HLD, anemia, seizures, h/o colon CA s/p resection. Pt presented to ED via EMA on 05/30/20 after being found on the floor by family. EMS witnessed pt have generalized tonic-clonic seizure with urinary  incontinence lasting approximately 1 minute prior to self resolving.  Pt had also c/o epigastric abdominal pain with radiation to back which lasted ~6-7 hours prior to self resolving.  CT of head and cervical spine did not reveal any acute abnormalities.  CT imaging of thoracic and lumbar spine noted sever spinal stenosis at L4-5 and large non obstructing left renal stone.  RUQ revealed cholelithiasis without evidence of acute cholecystitis and cirrhosis. Pt underwent ERCP on 05/31/20. She underwent cholecystectomy on 05/31/20. Code stroke was called d/t acute changes in pt's language and speech skills.  MRI done and showed multiple punctate foci of acute ischemia within the right centrum semi ovale with no hemorrhages or mass-effect seen. Pt also had MRA of head, echocardiogram as well as Carotid Dopplers. Neurology determined pt had an acute CVA. Therapy evaluations completed and noted deficits in mobility, cognition and ability to complete ADLs.   CIR recommended to help pt return to PLOF.  Complete NIHSS TOTAL: 1  Patient's medical record from [facility] has been reviewed by the rehabilitation admission coordinator and physician.  Past Medical History  Past Medical History:  Diagnosis Date  . Anemia   . Diabetes mellitus, type II ([place])   . Glaucoma    Dr.Hecker  . History of  colon cancer 1998   Dr. Lennie Hummer  . Hyperlipemia   . Osteopenia 02/2012, 03/2014   DEXA hip -2.2  . Renal insufficiency   . Seizures ([place])   . Stenosing tenosynovitis of finger of left hand 2016   index - s/p steroid injection x2    Family History   family history includes Breast cancer in her mother and sister; Cancer in her mother and sister; Diabetes in her father and sister; Heart attack (age of onset: 103) in her father; Heart disease in her father; Hypertension in her father and son; Peripheral vascular disease in her sister; Stroke (age of onset: 38) in her father.  Prior Rehab/Hospitalizations Has  the patient had prior rehab or hospitalizations prior to admission? No  Has the patient had major surgery during 100 days prior to admission? Yes   Current Medications  Current Facility-Administered Medications:  .  acetaminophen (TYLENOL) tablet 1,000 mg, 1,000 mg, Oral, Q6H PRN, Sheikh, Omair Latif, DO .  albuterol (PROVENTIL) (2.5 MG/3ML) 0.083% nebulizer solution 2.5 mg, 2.5 mg, Nebulization, Q6H PRN, Romana Juniper A, MD .  aspirin EC tablet 325 mg, 325 mg, Oral, Daily, Raiford Noble Latif, DO, 325 mg at 06/10/20 1119 .  atorvastatin (LIPITOR) tablet 40 mg, 40 mg, Oral, Daily, Raiford Noble Latif, DO, 40 mg at 06/10/20 1119 .  carvedilol (COREG) tablet 12.5 mg, 12.5 mg, Oral, BID, Romana Juniper A, MD, 12.5 mg at 06/10/20 1119 .  clopidogrel (PLAVIX) tablet 75 mg, 75 mg, Oral, Daily, Rosalin Hawking, MD, 75 mg at 06/10/20 1120 .  feeding supplement (ENSURE ENLIVE / ENSURE PLUS) liquid 237 mL, 237 mL, Oral, Q1500, Raiford Noble Latif, DO, 237 mL at 06/09/20 1422 .  fentaNYL (SUBLIMAZE) injection 12.5 mcg, 12.5 mcg, Intravenous, Q4H PRN, Romana Juniper A, MD, 12.5 mcg at 06/01/20 1541 .  heparin injection 5,000 Units, 5,000 Units, Subcutaneous, Q8H, Raiford Noble [place], DO, 5,000 Units at 06/10/20 0536 .  hydrALAZINE (APRESOLINE) injection 10 mg, 10 mg, Intravenous, Q6H PRN, Raiford Noble Latif, DO, 10 mg at 06/09/20 0854 .  insulin aspart (novoLOG) injection 0-5 Units, 0-5 Units, Subcutaneous, QHS, Raiford Noble [place], [place], 2 Units at 06/04/20 2201 .  insulin aspart (novoLOG) injection 0-9 Units, 0-9 Units, Subcutaneous, TID WC, Raiford Noble [place], [place], 2 Units at 06/10/20 [phone] .  insulin glargine (LANTUS) injection 8 Units, 8 Units, Subcutaneous, QHS, Clovis Riley, MD, 8 Units at 06/09/20 2118 .  lactated ringers infusion, , Intravenous, Continuous, Romana Juniper A, MD, Last Rate: 10 mL/hr at 06/01/20 1243, New Bag at 06/01/20 1347 .  levETIRAcetam (KEPPRA) tablet 500 mg, 500 mg, Oral,  BID, Rosalin Hawking, MD, 500 mg at 06/10/20 1120 .  ondansetron (ZOFRAN) tablet 4 mg, 4 mg, Oral, Q6H PRN **OR** ondansetron (ZOFRAN) injection 4 mg, 4 mg, Intravenous, Q6H PRN, Kae Heller, Chelsea A, MD .  polyvinyl alcohol (LIQUIFILM TEARS) 1.4 % ophthalmic solution 1 drop, 1 drop, Both Eyes, TID PRN, Clovis Riley, MD, 1 drop at 06/01/20 1704 .  ramipril (ALTACE) capsule 5 mg, 5 mg, Oral, Daily, Romana Juniper A, MD, 5 mg at 06/10/20 1119 .  sodium chloride flush (NS) 0.9 % injection 3 mL, 3 mL, Intravenous, Q12H, Kae Heller, Chelsea A, MD, 3 mL at 06/10/20 1120 .  traMADol (ULTRAM) tablet 50-100 mg, 50-100 mg, Oral, Q12H PRN, Dessa Phi, DO  Patients Current Diet:  Diet Order            Diet Carb Modified Fluid consistency: Thin; Room service  appropriate? Yes  Diet effective now                 Precautions / Restrictions Precautions Precautions: Fall Precaution Comments: monitor for vitals and neuro symptoms Restrictions Weight Bearing Restrictions: No   Has the patient had 2 or more falls or a fall with injury in the past year? Yes  Prior Activity Level Limited Community (1-2x/wk): gets out of house 2x/week  Prior Functional Level Self Care: Did the patient need help bathing, dressing, using the toilet or eating? Independent  Indoor Mobility: Did the patient need assistance with walking from room to room (with or without device)? Independent  Stairs: Did the patient need assistance with internal or external stairs (with or without device)? Independent  Functional Cognition: Did the patient need help planning regular tasks such as shopping or remembering to take medications? Independent  Home Assistive Devices / [place] Devices/Equipment: Environmental consultant (specify type) Home Equipment: Walker - 2 wheels,Cane - single point,Grab bars - tub/shower,Shower seat  Prior Device Use: Indicate devices/aids used by the patient prior to current illness, exacerbation or injury?  Walker and cane  Current Functional Level Cognition  Arousal/Alertness: Awake/alert Overall Cognitive Status: Impaired/Different from baseline Current Attention Level: Focused Orientation Level: Oriented X4 Following Commands: Follows one step commands consistently,Follows multi-step commands inconsistently Safety/Judgement: Decreased awareness of safety,Decreased awareness of deficits General Comments: Slow processing of cues with poor ability to only perform cues on L leg instead of R. Pt would get RW stuck on obstacles occasionally with min correction, thus decreased safety awareness. Memory: Impaired (recalled 2 of 4 words after 5 minute delay; 4/4 with cue) Memory Impairment: Storage deficit Awareness: Appears intact Problem Solving: Appears intact Safety/Judgment: Appears intact    Extremity Assessment (includes Sensation/Coordination)  Upper Extremity Assessment: Overall WFL for tasks assessed  Lower Extremity Assessment: Defer to PT evaluation    ADLs  Overall ADL's : Needs assistance/impaired Grooming: Minimal assistance,Standing Upper Body Dressing : Set up,Sitting Lower Body Dressing: Minimal assistance,Sit to/from stand Toilet Transfer: Minimal assistance,RW Toileting- Clothing Manipulation and Hygiene: Minimal assistance,Sit to/from stand Functional mobility during ADLs: Minimal assistance,Rolling walker General ADL Comments: Min A for safety with cues for walker management and proximity. Patient frequently bumping into furniture in room on R and L.    Mobility  Overal bed mobility: Needs Assistance Bed Mobility: Supine to Sit Supine to sit: HOB elevated,Min assist Sit to supine: Min assist General bed mobility comments: Pt sitting up in recliner upon arrival.    Transfers  Overall transfer level: Needs assistance Equipment used: Rolling walker (2 wheeled) Transfers: Sit to/from Stand Sit to Stand: Min assist General transfer comment: MinA for steadying coming  to stand, requiring extra time to power up to stand. Initially reaching for RW but required extra time to follow cues to push up from chair for safety purposes instead.    Ambulation / Gait / Stairs / Wheelchair Mobility  Ambulation/Gait Ambulation/Gait assistance: Herbalist (Feet): 250 Feet (x1 standing rest break during) Assistive device: Rolling walker (2 wheeled) Gait Pattern/deviations: Step-through pattern,Decreased stride length,Trunk flexed,Decreased dorsiflexion - left General Gait Details: Slow, unsteady gait cuing pt to remain within middle of RW, with success. Demonstartes poor L foot clearance with decreased dorisflexion and hip flexion with swing compared to R, cued to correct by exaggerating "marching" with gait, noted success but pt performed on R side also despite cues just to attempt on L only. MinA to direct pt away from obstacles when she  would get R side of RW stuck. Gait velocity: reduced Gait velocity interpretation: <1.8 ft/sec, indicate of risk for recurrent falls    Posture / Balance Dynamic Sitting Balance Sitting balance - Comments: close supervision for safety Balance Overall balance assessment: Needs assistance Sitting-balance support: Feet supported,No upper extremity supported Sitting balance-Leahy Scale: Fair Sitting balance - Comments: close supervision for safety Standing balance support: During functional activity,Bilateral upper extremity supported Standing balance-Leahy Scale: Poor Standing balance comment: Reliant on UE support for balance. High Level Balance Comments: Pt's son present during session. Not able to get HR reading on pt as batteries dead. Standardized Balance Assessment Standardized Balance Assessment : TUG: Timed Up and Go Test Berg Balance Test Sit to Stand: Needs minimal aid to stand or to stabilize Standing Unsupported: Able to stand 30 seconds unsupported Sitting with Back Unsupported but Feet Supported on Floor or  Stool: Able to sit 2 minutes under supervision Stand to Sit: Uses backs of legs against chair to control descent Transfers: Needs one person to assist Standing Unsupported with Eyes Closed: Able to stand 3 seconds Standing Ubsupported with Feet Together: Needs help to attain position and unable to hold for 15 seconds From Standing, Reach Forward with Outstretched Arm: Reaches forward but needs supervision From Standing Position, Pick up Object from Floor: Unable to try/needs assist to keep balance From Standing Position, Turn to Look Behind Over each Shoulder: Needs assist to keep from losing balance and falling Turn 360 Degrees: Needs assistance while turning Standing Unsupported, Alternately Place Feet on Step/Stool: Needs assistance to keep from falling or unable to try Standing Unsupported, One Foot in Front: Loses balance while stepping or standing Standing on One Leg: Unable to try or needs assist to prevent fall Total Score: 12 Timed Up and Go Test TUG: Normal TUG Normal TUG (seconds): 18.55    Special needs/care consideration Continuous Drip IV  lactated ringers infusion: 10 mL/hr, Skin abrasion: arm, hand/left; eccymosis: arm, hand/left, Diabetic management novoLOG: 0-5 units at bedtime; novoLOG 0-9 units 3x daily with meals; Lantus 8 units at bedtime and Designated visitor Alain Honey, son; [name], daughter   Previous Home Environment (from acute therapy documentation) Living Arrangements: Alone  Lives With: Alone Available Help at Discharge: Family,Available PRN/intermittently Type of Home: Other(Comment) (condo) Home Layout: One level Home Access: Elevator Entrance Stairs-Rails: Can reach both Entrance Stairs-Number of Steps: 2 Bathroom Shower/Tub: Multimedia programmer: Standard Bathroom Accessibility: Yes How Accessible: Accessible via walker [place]: No Additional Comments: has a great deal of equipment from caring for her husband  Discharge  Living Setting Plans for Discharge Living Setting: Patient's home Type of Home at Discharge: Other (Comment) (condo) Discharge Home Layout: One level Discharge Home Access: Elevator Discharge Bathroom Shower/Tub: Walk-in shower Discharge Bathroom Toilet: Standard Discharge Bathroom Accessibility: Yes How Accessible: Accessible via walker Does the patient have any problems obtaining your medications?: No  Social/Family/Support Systems Anticipated Caregiver: Alain Honey, son; [name], daughter Anticipated Caregiver's Contact Information: Randall Hiss: [name]: [phone] Caregiver Availability: Intermittent Discharge Plan Discussed with Primary Caregiver: Yes Is Caregiver In Agreement with Plan?: Yes Does Caregiver/Family have Issues with Lodging/Transportation while Pt is in Rehab?: No  Goals Patient/Family Goal for Rehab: Mod I: PT/OT, Min A-Supervision: ST Expected length of stay: 7-10 days Pt/Family Agrees to Admission and willing to participate: Yes Program Orientation Provided & Reviewed with Pt/Caregiver Including Roles  & Responsibilities: Yes  Decrease burden of Care through IP rehab admission: NA  Possible need for SNF  placement upon discharge: NA  Patient Condition: I have reviewed medical records from [facility], spoken with CM, and patient and family member. I met with patient at the bedside and discussed via phone for inpatient rehabilitation assessment.  Patient will benefit from ongoing PT, SLP and OT, can actively participate in 3 hours of therapy a day 5 days of the week, and can make measurable gains during the admission.  Patient will also benefit from the coordinated team approach during an Inpatient Acute Rehabilitation admission.  The patient will receive intensive therapy as well as Rehabilitation physician, nursing, social worker, and care management interventions.  Due to safety, skin/wound care, disease management, medication  administration, pain management and patient education the patient requires 24 hour a day rehabilitation nursing.  The patient is currently min A with mobility and basic ADLs.  Discharge setting and therapy post discharge at home with home health is anticipated.  Patient has agreed to participate in the Acute Inpatient Rehabilitation Program and will admit today.  Preadmission Screen Completed By: Clemens Catholic with updates by  Cleatrice Burke, 06/10/2020 11:37 AM ______________________________________________________________________   Discussed status with Dr. Naaman Plummer on 06/10/2020 at 1138 and received approval for admission today.  Admission [place] with updates by   Cleatrice Burke, RN, time [phone] Date 06/10/2020   Assessment/Plan: Diagnosis: right centrum semiovale infarct after gall bladder surgery 1. Does the need for close, 24 hr/day Medical supervision in concert with the patient's rehab needs make it unreasonable for this patient to be served in a less intensive setting? Yes 2. Co-Morbidities requiring supervision/potential complications: DM, sz, hx of colon ca 3. Due to bladder management, bowel management, safety, skin/wound care, disease management, medication administration, pain management and patient education, does the patient require 24 hr/day rehab nursing? Yes 4. Does the patient require coordinated care of a physician, rehab nurse, PT, OT, and SLP to address physical and functional deficits in the context of the above medical diagnosis(es)? Yes Addressing deficits in the following areas: balance, endurance, locomotion, strength, transferring, bowel/bladder control, bathing, dressing, feeding, grooming, toileting, cognition, speech, language, swallowing and psychosocial support 5. Can the patient actively participate in an intensive therapy program of at least 3 hrs of therapy 5 days a week? Yes 6. The potential for patient to make measurable gains while on  inpatient rehab is excellent 7. Anticipated functional outcomes upon discharge from inpatient rehab: modified independent PT, modified independent OT, supervision and min assist SLP 8. Estimated rehab length of stay to reach the above functional goals is: 7-10 days 9. Anticipated discharge destination: Home 10. Overall Rehab/Functional Prognosis: excellent   MD Signature: Meredith Staggers, MD, [facility] 06/10/2020

## 2020-06-06 NOTE — Plan of Care (Signed)
  Problem: Education: Goal: Knowledge of General Education information will improve Description: Including pain rating scale, medication(s)/side effects and non-pharmacologic comfort measures Outcome: Progressing   Problem: Health Behavior/Discharge Planning: Goal: Ability to manage health-related needs will improve Outcome: Progressing   Problem: Nutrition Goal: Nutritional status is improving Description: Monitor and assess patient for malnutrition (ex- brittle hair, bruises, dry skin, pale skin and conjunctiva, muscle wasting, smooth red tongue, and disorientation). Collaborate with interdisciplinary team and initiate plan and interventions as ordered.  Monitor patient's weight and dietary intake as ordered or per policy. Utilize nutrition screening tool and intervene per policy. Determine patient's food preferences and provide high-protein, high-caloric foods as appropriate.  Outcome: Progressing

## 2020-06-07 DIAGNOSIS — N1831 Chronic kidney disease, stage 3a: Secondary | ICD-10-CM | POA: Diagnosis not present

## 2020-06-07 DIAGNOSIS — K831 Obstruction of bile duct: Secondary | ICD-10-CM | POA: Diagnosis not present

## 2020-06-07 DIAGNOSIS — K8020 Calculus of gallbladder without cholecystitis without obstruction: Secondary | ICD-10-CM | POA: Diagnosis not present

## 2020-06-07 LAB — GLUCOSE, CAPILLARY
Glucose-Capillary: 114 mg/dL — ABNORMAL HIGH (ref 70–99)
Glucose-Capillary: 194 mg/dL — ABNORMAL HIGH (ref 70–99)
Glucose-Capillary: 171 mg/dL — ABNORMAL HIGH (ref 70–99)
Glucose-Capillary: 142 mg/dL — ABNORMAL HIGH (ref 70–99)

## 2020-06-07 LAB — CBC WITH DIFFERENTIAL/PLATELET
Abs Immature Granulocytes: 0.35 10*3/uL — ABNORMAL HIGH (ref 0.00–0.07)
Basophils Absolute: 0 10*3/uL (ref 0.0–0.1)
Basophils Relative: 0 %
Eosinophils Absolute: 0.6 10*3/uL — ABNORMAL HIGH (ref 0.0–0.5)
Eosinophils Relative: 6 %
HCT: 28.1 % — ABNORMAL LOW (ref 36.0–46.0)
Hemoglobin: 9 g/dL — ABNORMAL LOW (ref 12.0–15.0)
Immature Granulocytes: 4 %
Lymphocytes Relative: 28 %
Lymphs Abs: 2.5 10*3/uL (ref 0.7–4.0)
MCH: 32 pg (ref 26.0–34.0)
MCHC: 32 g/dL (ref 30.0–36.0)
MCV: 100 fL (ref 80.0–100.0)
Monocytes Absolute: 0.9 10*3/uL (ref 0.1–1.0)
Monocytes Relative: 10 %
Neutro Abs: 4.7 10*3/uL (ref 1.7–7.7)
Neutrophils Relative %: 52 %
Platelets: 171 10*3/uL (ref 150–400)
RBC: 2.81 MIL/uL — ABNORMAL LOW (ref 3.87–5.11)
RDW: 13.9 % (ref 11.5–15.5)
WBC: 9 10*3/uL (ref 4.0–10.5)
nRBC: 0 % (ref 0.0–0.2)

## 2020-06-07 LAB — COMPREHENSIVE METABOLIC PANEL
ALT: 36 U/L (ref 0–44)
AST: 29 U/L (ref 15–41)
Albumin: 2.3 g/dL — ABNORMAL LOW (ref 3.5–5.0)
Alkaline Phosphatase: 171 U/L — ABNORMAL HIGH (ref 38–126)
Anion gap: 9 (ref 5–15)
BUN: 21 mg/dL (ref 8–23)
CO2: 18 mmol/L — ABNORMAL LOW (ref 22–32)
Calcium: 8.7 mg/dL — ABNORMAL LOW (ref 8.9–10.3)
Chloride: 115 mmol/L — ABNORMAL HIGH (ref 98–111)
Creatinine, Ser: 1.03 mg/dL — ABNORMAL HIGH (ref 0.44–1.00)
GFR, Estimated: 53 mL/min — ABNORMAL LOW (ref >60)
Glucose, Bld: 128 mg/dL — ABNORMAL HIGH (ref 70–99)
Potassium: 4.2 mmol/L (ref 3.5–5.1)
Sodium: 142 mmol/L (ref 135–145)
Total Bilirubin: 0.5 mg/dL (ref 0.3–1.2)
Total Protein: 5 g/dL — ABNORMAL LOW (ref 6.5–8.1)

## 2020-06-07 LAB — PHOSPHORUS: Phosphorus: 4.2 mg/dL (ref 2.5–4.6)

## 2020-06-07 LAB — MAGNESIUM: Magnesium: 1.9 mg/dL (ref 1.7–2.4)

## 2020-06-07 MED ORDER — ACETAMINOPHEN 500 MG PO TABS: 1000.0000 mg | ORAL_TABLET | Freq: Four times a day (QID) | ORAL | Status: DC | PRN

## 2020-06-07 NOTE — Progress Notes (Signed)
Physical Therapy Treatment Patient Details Name: Taylor Castaneda MRN: [id number] DOB: 1932-08-10 Today's Date: 06/07/2020    History of Present Illness 87 y.o. female presenting after syncopal episode followed by seizure. RUQ ultrasound (+) cholelithiasis w/o acute cholecystitis and cirrhosis s/p ERCP on 12/28 by Dr. Lyndel Safe. Patient also found to have AKI on CKD. CT head (-) for acute findings. Evidence of mild chronic small vessel ischemic disease and moderately advanced cerebral atrophy. MRI (+) intracranial stenosis and multiple new punctate foci of acute ischemia within the right centrum  semiovale. PMHx significant for DMII, HLD, seizures, and Hx colon CA s/p resection.    PT Comments    Patient progressing slowly towards PT goals. Pt found bleeding in LUE from puncture site, all over sheet and gown. Applied pressure. RN notified. Tolerated transfers and gait training with Min A for balance/safety as pt still has a tendency to veer left and difficulty navigating RW. Noted to have multiple LOB during session esp standing at sink and with turns. Noted to have 2-3/3 DOE with activity however VSS on RA. Continues to have difficulty with expressive language and poor awareness of safety/deficits. Continues to be a good CIR candidate. Will follow.   Follow Up Recommendations  CIR     Equipment Recommendations  Rolling walker with 5" wheels    Recommendations for Other Services       Precautions / Restrictions Precautions Precautions: Fall Restrictions Weight Bearing Restrictions: No    Mobility  Bed Mobility Overal bed mobility: Needs Assistance Bed Mobility: Supine to Sit     Supine to sit: HOB elevated;Min assist     General bed mobility comments: Assist to get to EOB as pt not using LUE due to therapist providing pressure/bleeding. Assist with trunk to assist.  Transfers Overall transfer level: Needs assistance Equipment used: Rolling walker (2 wheeled) Transfers: Sit to/from  Stand Sit to Stand: Min assist         General transfer comment: Min A to steady in standing, stood from EOB x2, from toilet x1, transferred to chair post ambulation.  Ambulation/Gait Ambulation/Gait assistance: Min assist Gait Distance (Feet): 16 Feet (x2 + 35') Assistive device: Rolling walker (2 wheeled) Gait Pattern/deviations: Step-through pattern;Decreased stride length;Trunk flexed;Staggering left Gait velocity: reduced   General Gait Details: Slow, unsteady gait, veering left and sometimes picking RW off floor. 2-3/4 DOE. VSS with Sp02 98%. 1 seated rest break due to SOB.   Stairs             Wheelchair Mobility    Modified Rankin (Stroke Patients Only) Modified Rankin (Stroke Patients Only) Pre-Morbid Rankin Score: No significant disability Modified Rankin: Moderately severe disability     Balance Overall balance assessment: Needs assistance Sitting-balance support: Feet supported;No upper extremity supported Sitting balance-Leahy Scale: Fair Sitting balance - Comments: close supervision for safety   Standing balance support: During functional activity Standing balance-Leahy Scale: Poor Standing balance comment: Requires UE support vs external support for standing. Able to stand at sinka nd wash hands with a few LOB with pt able to catch self, close min guard. usually LOB to the left.                 Standardized Balance Assessment Standardized Balance Assessment : TUG: Timed Up and Go Test     Timed Up and Go Test TUG: Normal TUG Normal TUG (seconds): 18.55    Cognition Arousal/Alertness: Awake/alert Behavior During Therapy: WFL for tasks assessed/performed Overall Cognitive Status: Impaired/Different from baseline Area  of Impairment: Awareness;Problem solving;Safety/judgement                         Safety/Judgement: Decreased awareness of safety;Decreased awareness of deficits Awareness: Intellectual Problem Solving: Slow  processing;Requires verbal cues General Comments: Has difficutlies in the moment expressing self. Unaware of bleeding all over LUE and sheets.      Exercises      General Comments General comments (skin integrity, edema, etc.): VSS on RA despite SOB. Pt found to be bleeding from LUE, all over sheets and gown. RN notified. Applied pressure and changed gown.      Pertinent Vitals/Pain Pain Assessment: No/denies pain    Home Living                      Prior Function            PT Goals (current goals can now be found in the care plan section) Progress towards PT goals: Progressing toward goals    Frequency    Min 4X/week      PT Plan Current plan remains appropriate    Co-evaluation              AM-PAC PT "6 Clicks" Mobility   Outcome Measure  Help needed turning from your back to your side while in a flat bed without using bedrails?: A Little Help needed moving from lying on your back to sitting on the side of a flat bed without using bedrails?: A Little Help needed moving to and from a bed to a chair (including a wheelchair)?: A Little Help needed standing up from a chair using your arms (e.g., wheelchair or bedside chair)?: A Little Help needed to walk in hospital room?: A Little Help needed climbing 3-5 steps with a railing? : A Lot 6 Click Score: 17    End of Session Equipment Utilized During Treatment: Gait belt Activity Tolerance: Patient tolerated treatment well Patient left: in chair;with call bell/phone within reach;with chair alarm set;with family/visitor present Nurse Communication: Mobility status PT Visit Diagnosis: Unsteadiness on feet (R26.81);Muscle weakness (generalized) (M62.81);Other symptoms and signs involving the nervous system (V78.588)     Time: 5027-7412 PT Time Calculation (min) (ACUTE ONLY): 19 min  Charges:  $Therapeutic Activity: 8-22 mins                     Marisa Severin, PT, DPT Acute Rehabilitation Services Pager  [phone] Office [phone]       Marguarite Arbour A Sabra Heck 06/07/2020, 8:19 AM

## 2020-06-07 NOTE — Plan of Care (Signed)
  Problem: Nutrition Goal: Nutritional status is improving Description: Monitor and assess patient for malnutrition (ex- brittle hair, bruises, dry skin, pale skin and conjunctiva, muscle wasting, smooth red tongue, and disorientation). Collaborate with interdisciplinary team and initiate plan and interventions as ordered.  Monitor patient's weight and dietary intake as ordered or per policy. Utilize nutrition screening tool and intervene per policy. Determine patient's food preferences and provide high-protein, high-caloric foods as appropriate.  Outcome: Progressing   Problem: Clinical Measurements: Goal: Ability to maintain clinical measurements within normal limits will improve Outcome: Progressing Goal: Will remain free from infection Outcome: Progressing   Problem: Activity: Goal: Risk for activity intolerance will decrease Outcome: Progressing   Problem: Pain Managment: Goal: General experience of comfort will improve Outcome: Progressing

## 2020-06-07 NOTE — Progress Notes (Signed)
Inpatient Rehab Admissions Coordinator:   Met with Pt. And son regarding potential CIR admit. They remain interested; however, I do not have insurance auth for this Will continue to pursue for potential admit pending insurance auth and bed availability.   Clemens Catholic, [place], [place] Admissions Coordinator  [phone] ([place]) [phone] (office)

## 2020-06-07 NOTE — Progress Notes (Addendum)
PROGRESS NOTE    Taylor Castaneda  RFX:[id number] DOB: [birth year] DOA: 05/30/2020 PCP: [name], MD   Brief Narrative:  HPI per Dr. Fuller Plan on 05/30/20 Taylor Castaneda is a 87 y.o. female with medical history significant of diabetes mellitus type 2, hyperlipidemia, seizure disorder, history colon cancer s/p resection, and anemia presented with complaints of passing out while at her condo.  The patient's daughter found her on the floor and was able to help her get to a chair.  However when EMS arrived patient had a witnessed generalized tonic-clonic seizure with urinary incontinence lasting approximately 1 minute prior to self resolving.  They did not have to give any abortive medications.  She had been compliant with her medications and last reported having a seizure over a year ago.  At baseline patient lives alone and handles all of her own medications.  Patient had not been feeling well earlier in the day with complaints of epigastric abdominal pain with radiation to her back.  Denied any recent trauma prior to onset of symptoms.  Symptoms lasted approximately 6 to 7 hours prior to self resolving.  She had taken Tums and Tylenol prior to coming into the hospital. CT scan of the head and cervical spine did not note any acute abnormalities.  CT imaging of the thoracic and lumbar spine noted severe spinal stenosis at L4-5 and large nonobstructing left renal stone.  Bowel sounds were noted to be stable.  Labs at the outside facility yesterday had been significant for WBC 17.8, hemoglobin 11.4, BUN 37, creatinine 1.4, alkaline phosphatase 283, AST 315, ALT 235, total bilirubin 3.3 and lactic acid initially 4->2.6.  COVID-19 and influenza screening was negative.  Right upper quadrant ultrasound revealed cholelithiasis without evidence of acute cholecystitis and cirrhosis.  Case had been discussed with GI here at [facility] who recommended MRCP which revealed cholelithiasis without signs of acute  cholecystitis, suspected small common bile duct stones distally with no duct dilatation, and pancreatic atrophy with dilatation in the mid body tail region with abrupt transition point concerning for possible stricture.  She had been given Keppra 1000 mg IV and 500 mL of normal saline IV fluids.  Patient had been accepted to the hospitalist service to a medical telemetry bed.  Repeat labs revealed WBC 25.6, hemoglobin 10.7, alkaline phosphatase 270, AST 219, ALT 207, and total bilirubin 3.7, lactic acid 2.2.  **Interim History  Patient underwent an ERCP and she had a periampullary diverticulum and she was noted to have choledocholithiasis which is status post biliary sphincterotomy and sphincteroplasty and balloon extraction.  She had cholelithiasis without cholecystitis and she had a mild gastritis.  Pancreatogram was not obtained and GI recommended watching for pancreatitis, bleeding or perforation and cholangitis.  Recommend a clear liquid diet and continue current medications and continue antibiotics for 5 days total and stopping the Flagyl.  He also recommended obtaining a surgical consultation while inpatient for questionable lap chole and following the results of the CA 19-9, CEA, IgG for.  Dr. Lyndel Safe feels that she will require an EUS as an outpatient for evaluation of pancreatic duct stricture.  After procedure she was sedated and very somnolent and drowsy and unable to be aroused given that she was asleep  General surgery was consulted on 05/31/2020 and after discussion with the patient they recommended a cholecystectomy which the patient was agreeable to.  Patient underwent a cholecystectomy on 06/01/2020 and is doing well postoperatively.  This morning she is slated to be  discharged however started having some word finding difficulties and could not articulate her words properly.  This was an acute change from her baseline and daughter was at bedside.  When I went to evaluate her if she was talking  normally but did also have some disarticulation of her words and after discussion with neurology a code stroke was called.  Neurology evaluated and symptoms had resolved but they felt she still needed further imaging so we are obtaining MRI of the brain and she was transferred to [address] for neuro checks closely.  MRI done and showed multiple punctate foci of acute ischemia within the right centrum semiovale with no hemorrhages or mass-effect seen.  Neurology recommended full stroke work-up and she ended up going for an MRA of the head without contrast, echocardiogram as well as Carotid Dopplers.  Neurology felt that she had an acute CVA versus subacute CVA and recommended aspirin and Plavix 325 mg and 75 mg p.o. respectively for 3 months and then just Plavix alone given her severe intracranial stenosis.  PT OT now recommending CIR so we have consulted to CIR for further evaluation and placement at inpatient rehab  06/04/2020 orthostatic vital signs were done and she did not meet orthostatic vital sign requirement but she did drop from lying to sitting to standing and were from 172/60 from lying and standing and went to 158/60. Currently awaiting CIR evaluation but she feels better today  06/05/2020 she is not orthostatic yesterday and LFTs have improved and her white blood cell count has resolved.  Remains on dual antiplatelet therapy and will consider resuming her statin now that her LFTs are improved.  Rest of her labs appear stable and blood count is stable as well.  We will have CIR evaluate and consult has been placed.  06/06/2020: LFTS resolved so will resume statin. Stable to D/C to CIR when bed is available  06/07/20: Continues to remain stable to D/C to CIR. Abx stopped yesterday.  Assessment & Plan:   Principal Problem:   Cholelithiasis Active Problems:   Hyperlipidemia associated with type 2 diabetes mellitus (HCC)   CKD (chronic kidney disease), stage III (HCC)   Hyperbilirubinemia   Normocytic  anemia   Transaminitis   Pancreatic duct dilated   Common bile duct (CBD) obstruction   Cerebral thrombosis with cerebral infarction   Cholelithiasis and choledocholithiasis status post ERCP on 05/31/2020 and now cholecystectomy postoperative day 6 Obstructive Jaundice with Hyperbilirubinemia and Abnormal LFTs, improving and resolved -Acute.  Patient present with complaints of abdominal pain with radiation to her back.   -Initial CT imaging studies significant for cholelithiasis without signs of cholecystitis and cirrhosis.   -GI has been initially consulted and MRCP was recommended.  MRCP revealed cholelithiasis without cholecystitis, small gallstones distally without clear duct dilatation, pancreatic duct dilatation in the mid body region with an abrupt transition, but no obvious obstructing lesion.   -Patient transferred for need of ERCP and this was done on 05/31/2020 -Admit to a medical telemetry -Check blood cultures and showed NGTD at 5 days -Diet had been advanced but she was made n.p.o. when she started having difficulty with her speech and word finding -Start empiric antibiotics of Rocephin and metronidazole and will now stop Abx; GI recommending stopping Flagyl  -Follow up on CA19-9 was less than 2, CEA was 3.2, IgG4 was 49 -[facility] consult, will follow-up for further recommendations -Underwent ERCP today and she had a periampullary diverticulum and she was noted to have choledocholithiasis which  is status post biliary sphincterotomy and sphincteroplasty and balloon extraction.  She had cholelithiasis without cholecystitis and she had a mild gastritis.   -Pancreatogram was not obtained and GI recommended watching for pancreatitis, bleeding or perforation and cholangitis.   -Dr. Lyndel Safe Recommend a clear liquid diet and continue current medications and continue antibiotics for 5 days total and stopping the Flagyl. He also recommended obtaining a surgical consultation while inpatient  for questionable lap chole and following the results of the CA 19-9, CEA, IgG4. -She is getting scheduled Tylenol 1000 mg p.o. every 6 hours but will change this to as needed today -T Bili went from 3.7 and its peak is now 0.5 -General Surgery consulted for further evaluation and they they discussed the possibility of cholecystectomy with the patient.  She is agreeable to surgical intervention and decided to proceed with cholecystectomy and this was done on 06/01/2020 -Will need PT/OT to evaluate and Treat to determine safe discharge disposition and this can be done after her surgery -Surgery has evaluated patient and cleared her for discharge and PT OT recommending home health PT and OT initially but now recommending CIR and will consult CIR for assistance with placement; Remains medically Stable to D/C  Expressive Aphasia in the setting of acute versus subacute CVA the setting of large vessel disease -Patient had significant difficulty time finding her words on 06/02/2020 and when she did she repeat herself and kept saying "free" -When I went to evaluate her she was improving but still had some difficulty articulating her words -Spoke with neurology who recommended calling a code stroke and a stat head CT was ordered but when she was evaluated by neurology her symptoms resolved so code stroke was canceled and MRI was ordered -Patient was transferred to [address] for continuing neurochecks every 2 hours  -Continue monitor carefully and follow-up with neurology recommendations and follow-up on MRI -MRI was done and showed "Multiple punctate foci of acute ischemia within the right centrum semiovale. No hemorrhage or mass effect." -MRA of the Head w/o Contrast was recommended and showed "Advanced intracranial atherosclerosis. No large vessel occlusion identified, but  Severe stenosis at the Right MCA bifurcation, with long segment involvement of the superior M2. Severe stenosis distal Left ICA siphon and Left  MCA superior M2 branch origin. Moderate stenosis Right PCA P2 segment." -Carotid Dopplers done and showed "Velocities in the right ICA are consistent with a 1-39% stenosis.   Left Carotid: Velocities in the left ICA are consistent with a 1-39% stenosis." -ECHOCardiogram done and showed EF of 60 to 65% with grade 2 status function noted within right ventricular systolic function with mildly reduced with no mention of thrombus noted -EEG will be ordered to rule out Seizures showed that the study was suggestive of cortical dysfunction in the right more than left temporal region with nonspecific etiology with no seizures or epileptiform discharges seen throughout the recording -Surgery evaluated and felt that her speech is worse when she stands up; Will continue to observe and get Orthostatics; Will need TED Hose; Repeat Orthostatic done and she was not orthostatic today and TED hose were ordered -SLP evaluated and reccommending Home Health SLP -Neurology recommending ASA 325 mg po Daily and Plavix 75 mg po Daily for 3 months then Plavix along given her sever Intracranial Stenosis -Statin Held due to Abnormal LFTs and will resume now in the outpatient setting with Dr. Leta Baptist -PT/OT now recommending CIR so will consult Rehab for Screening and MD for Evaluation to CIR -  Follow-up on neurology recommendations and they have signed off the case; We have resumed her Statin now that her LFTs are normal  Pancreatic Duct Dilatation  -As seen above.  Lipase was limits and no significant signs of inflammation noted on imaging. -CT of the abdomen with and without contrast -Further Care Per GI -Dr. Lyndel Safe feels that she will require an EUS as an outpatient for evaluation of pancreatic duct stricture as she did not have a Pancreatogram done here  Leukocytosis and lactic acidosis, improving -Acute.  -WBC elevated from 17.8 to 25.6 with lactic acid initially elevated up to 4, but slowly trending down.  -Patient  vital signs were noted to be relatively within normal limits not necessarily meeting sepsis criteria. -WBC is now resolved and 10.5 -LA is trending down and LA was last 2.4 -Follow-up blood cultures and pending  -Check urinalysis and culture if necessary -Abx have now been discontinued  -IV fluid hydration is now -Trend lactic acid levels  Seizure Disorder -Patient had witnessed generalized tonic-clonic seizure lasting 1 minute with continence of urine prior to admission.   -Suspect this could have been the reason patient had fallen on the floor as well.   -Home medications include Keppra 250 mg twice daily. Patient had received 1000 mg IV while hospitalized. -Seizure precaution  -Continued home regimen of Keppra initially but Neurology has increased Keppra to 500 mg po BID -She evaluated and she follows up with Dr. Andrey Spearman and [facility] and because of this admission she is found at home with tremorous activity and EEG showed cortical dysfunction.  Neurology thinks that she could have had some cerebral hypoperfusion with orthostatic hypotension so we will recheck orthostatics in the morning -MRI of the brain was obtained and above  Essential Hypertension but now with Orthostatic Hypotension -Home blood pressure medications include Altace 5 mg daily, Coreg 12.5 mg twice daily, and furosemide 20 mg daily. -Continue to Hold furosemide but resumed Ramipril 5 mg po Daily -Continue Carvedilol 12.5 mg po BID -Continue to Monitor BP per Protocol  -Repeat Orthostatic VS pending  -Last blood pressure was 171/64 -Continue with TED hose  HX of A Fib -Currently in NSR -Not on Anticoagulation due to Fall risk -Follow up with Cardiology as an outpateint and Neurology does not feel she has had an embolic stroke  Acute kidney injury superimposed on chronic kidney disease IIIb Metabolic Acidosis -Creatinine was initially noted to be 1.4 (around patient's baseline), but repeat check today 1.76  with BUN 34 on admission; Now BUN/Cr stable and much improved to 21/1.03 -Check CK and was 75 -Patient has a small acidosis now with a CO2 of 18, chloride level 115, and anion gap of 9 -IV fluid hydration is now stopped -Avoid nephrotoxic medications, contrast dyes, hypotension and renally dose medications -Repeat CMP in a.m.  Normocytic Anemia -Acute.     -Hemoglobin/hematocrit is relatively stable at 9.0/28.1 -Check anemia panel and showed an iron level of 50, TIBC 105, TIBC 155, saturation ratio 32%, ferritin level of 393, folate of 21.4, and vitamin B12 of 588 -Continue to monitor for signs and symptoms of bleeding; currently no overt bleeding noted -Repeat CBC in a.m.  Diabetes Mellitus Type 2, uncontrolled -On admission glucose elevated up to 205.  Last hemoglobin A1c noted to be 8.7 on 02/12/2020.  Home medication includes Lantus 16 units nightly. -Hypoglycemic protocol -Check hemoglobin A1c was 7.9 -Continue half dose of Lantus -CBGs before every meal with moderate SSI  -Diabetes education coronary is  following closely -CBGs ranging from 114-169  Abnormal LFTs and Hyperbilirubinemia, slowly improving -Acute.  Patient found to have AST 315-> 29 today and is resolved,  -ALT 235-> 36and is resolved, and total bilirubin 3.7 -> 0.5. -Continue to monitor and trend Hepatic Fxn Panel -Repeat CMP in the AM  Normocytic Anemia -Patient's hemoglobin/hematocrit stable at 9.0/28.1 -Checked Anemia Panel and showed iron level of 38, TIBC 136, TIBC 174, saturation ratios of 22%, ferritin level 327, folate level 17.3, vitamin B12 461 -Continue to Monitor for S/Sx of Bleeding; Currently no overt bleeding noted -Repeat CBC in the AM   Thrombocytopenia -Patient's Platelet count has gone from 113 -> 124 -> 119 -> 123 -> 145 -> 171 -Continue to Monitor and Trend and Continue to Monitor for S/Sx of Bleeding -Repeat CBC in the AM   Elevated Alk Phos -Was elevated to 282 on admission is  trended down and is now 171 -Continue to monitor and trend repeat CMP in a.m.  Hyperlipidemia -Resumed Atorvastatin 40 mg po Daily   DVT prophylaxis: SCDs; Will add Heparin 5,000 units sq q8h Code Status: FULL CODE Family Communication: Spoke with son at the bedside Disposition Plan: Has been cleared from a gastroenterology, general surgery as well as a neurologic perspective.  She will need to go to CIR if accepted versus SNF as she appears medically stable to be discharged now  Status is: Inpatient  Remains inpatient appropriate because:Unsafe d/c plan, IV treatments appropriate due to intensity of illness or inability to take PO and Inpatient level of care appropriate due to severity of illness   Dispo: The patient is from: Home              Anticipated d/c is to: CIR              Anticipated d/c date is: 1 day              Patient currently is medically stable to d/c.  Consultants:   Gastroenterology  General Surgery    Procedures:  ERCP Findings:      The scout film was normal. The upper GI tract was traversed under direct       vision without detailed examination. Mild antral gastritis was noted.      The major papilla was located at lower lip of periampullary diverticula.       The bile duct was deeply cannulated with the short-nosed traction       sphincterotome. Contrast was injected. I personally interpreted the bile       duct images. There was brisk flow of contrast through the ducts. Image       quality was adequate. Contrast extended to the entire biliary tree. 3       filling defects c/w choledocholithiasis was found in CBD which was       mildly dilated to 10 mm. Low insertion of cystic duct was noted. Cystic       duct did fill. Multiple filling defects in the gallbladder consistent       with cholelithiasis.      A 6 mm biliary sphincterotomy was made with a monofilament traction       (standard) sphincterotome using ERBE electrocautery at 12 o'clock        position. There was no post-sphincterotomy bleeding. Due to       periampullary diverticula, we elected to proceed with sphincteroplasty.       Sphincteroplasty was performed using 03-15-11 mm x 5.5 cm  CRE balloon to       a maximum balloon size of 10 mm. The biliary tree was swept multiple       times with a 12 mm balloon starting at the bifurcation. All 3 stones       were removed. Nothing was found on postocclusion cholangiogram. The bile       was green. There was no pus.      Pancreatogram was not obtained. We did try few times but the wire would       not find pancreatic duct after the sphincterotomy.      Mild antral gastritis was biopsied with a cold forceps for histology to       r/o HP. Impression:               -Periampullary diverticulum.                           -Choledocholithiasis s/p biliary sphincterotomy,                            sphincteroplasty and balloon extraction.                           -Cholelithiasis without cholecystitis.                           -Mild gastritis.                           -Pancreatogram not obtained. Recommendation:           - Watch for pancreatitis, bleeding, perforation,                            and cholangitis.                           - Clear liquid diet.                           - Continue present medications.                           - Continue antibiotics x total of 5 days. Can stop                            Flagyl.                           - Can obtain surgical consultation while inpatient                            ?Lap chole.                           - Follow results of CA 19?9/CEA/IgG4. I do believe                            she will require EUS as an outpatient for  evaluation of PD stricture. I will run it by Dr.                            Wendie Agreste. Mansouraty                           - The findings and recommendations were discussed                            with the patient's  family.  Laparoscopic cholecystectomy was performed by Dr. Romana Juniper on 06/01/2020  Antimicrobials: Anti-infectives (From admission, onward)   Start     Dose/Rate Route Frequency Ordered Stop   05/30/20 1615  cefTRIAXone (ROCEPHIN) 2 g in sodium chloride 0.9 % 100 mL IVPB  Status:  Discontinued        2 g 200 mL/hr over 30 Minutes Intravenous Every 24 hours 05/30/20 1516 06/06/20 0825   05/30/20 1615  metroNIDAZOLE (FLAGYL) tablet 500 mg  Status:  Discontinued        500 mg Oral Every 8 hours 05/30/20 1516 05/31/20 1611        Subjective: Seen And examined at bedside she was doing well sitting in the chair bedside.  Had no complaints.  Felt no pain.  Denies any lightheadedness or dizziness.  No chest pain or shortness of breath.  Remains stable to be discharged to CIR once bed is available.  No other concerns or complaints at this time.  Objective: Vitals:   06/06/20 2319 06/07/20 0355 06/07/20 0809 06/07/20 0811  BP: (!) 149/80 (!) 148/80 (!) 171/64 (!) 171/64  Pulse: 69 67 (!) 59 (!) 59  Resp: _0 Temp: 98 F (36.7 C) 98.7 F (37.1 C) 97.6 F (36.4 C) 97.6 F (36.4 C)  TempSrc: Rectal Oral Oral Oral  SpO2: 98% 99% 100% 100%  Weight:      Height:       No intake or output data in the 24 hours ending 06/07/20 1103 Filed Weights   05/30/20 1645 05/31/20 0912 06/01/20 1045  Weight: 50.8 kg 50.8 kg 50.8 kg   Examination: Physical Exam:  Constitutional: WN/WD elderly Caucasian female currently no acute distress sitting in a chair bedside appears calm and comfortable Eyes: Lids and conjunctivae normal, sclerae anicteric  ENMT: External Ears, Nose appear normal. Grossly normal hearing. Neck: Appears normal, supple, no cervical masses, normal ROM, no appreciable thyromegaly; no JVD Respiratory: Diminished to auscultation bilaterally, no wheezing, rales, rhonchi or crackles. Normal respiratory effort and patient is not tachypenic. No accessory muscle use.   Unlabored breathing Cardiovascular: RRR, no murmurs / rubs / gallops. S1 and S2 auscultated.  Trace extremity edema Abdomen: Soft, non-tender, non-distended.  Bowel sounds positive.  GU: Deferred. Musculoskeletal: No clubbing / cyanosis of digits/nails. No joint deformity upper and lower extremities and she is wearing TED hose.  Skin: Has a wound on her right leg that is covered with a small Mepilex pad. No induration; Warm and dry.  Neurologic: CN 2-12 grossly intact with no focal deficits. Romberg sign and cerebellar reflexes not assessed.  Psychiatric: Normal judgment and insight. Alert and oriented x 3. Normal mood and appropriate affect.   Data Reviewed: I have personally reviewed following labs and imaging studies  CBC: Recent Labs  Lab 06/03/20 0330 06/04/20 0213 06/05/20 0327 06/06/20 0116 06/07/20 0630  WBC 12.0* 11.4*  10.1 10.5 9.0  NEUTROABS 8.4* 7.9* 6.6 6.4 4.7  HGB 9.4* 9.8* 9.4* 9.0* 9.0*  HCT 29.1* 30.3* 27.5* 27.5* 28.1*  MCV 99.7 98.7 97.5 98.2 100.0  PLT 119* 123* 143* 145* 144   Basic Metabolic Panel: Recent Labs  Lab 06/03/20 0330 06/04/20 0213 06/05/20 0327 06/06/20 0116 06/07/20 0630  NA 142 142 143 143 142  K 4.2 4.0 3.9 3.8 4.2  CL 114* 114* 115* 113* 115*  CO2 19* 19* 19* 19* 18*  GLUCOSE 127* 103* 178* 121* 128*  BUN 36* 28* 26* 21 21  CREATININE 1.67* 1.18* 1.20* 1.22* 1.03*  CALCIUM 7.9* 8.4* 8.5* 8.6* 8.7*  MG 1.9 1.9 2.0 2.0 1.9  PHOS 2.6 3.0 3.3 3.6 4.2   GFR: Estimated Creatinine Clearance: 27.2 mL/min (A) (by C-G formula based on SCr of 1.03 mg/dL (H)). Liver Function Tests: Recent Labs  Lab 06/03/20 0330 06/04/20 0213 06/05/20 0327 06/06/20 0116 06/07/20 0630  AST 53* 43* _0 ALT 69* 56* 43 39 36  ALKPHOS 192* 200* 201* 181* 171*  BILITOT 0.9 0.8 0.7 0.6 0.5  PROT 4.9* 5.0* 5.0* 4.9* 5.0*  ALBUMIN 2.3* 2.3* 2.1* 2.0* 2.3*   No results for input(s): LIPASE, AMYLASE in the last 168 hours. No results for input(s):  AMMONIA in the last 168 hours. Coagulation Profile: No results for input(s): INR, PROTIME in the last 168 hours. Cardiac Enzymes: No results for input(s): CKTOTAL, CKMB, CKMBINDEX, TROPONINI in the last 168 hours. BNP (last 3 results) No results for input(s): PROBNP in the last 8760 hours. HbA1C: No results for input(s): HGBA1C in the last 72 hours. CBG: Recent Labs  Lab 06/06/20 0638 06/06/20 1149 06/06/20 1612 06/06/20 2115 06/07/20 0601  GLUCAP 93 165* 169* 155* 114*   Lipid Profile: No results for input(s): CHOL, HDL, LDLCALC, TRIG, CHOLHDL, LDLDIRECT in the last 72 hours. Thyroid Function Tests: No results for input(s): TSH, T4TOTAL, FREET4, T3FREE, THYROIDAB in the last 72 hours. Anemia Panel: Recent Labs    06/06/20 0116  VITAMINB12 461  FOLATE 17.3  FERRITIN 327*  TIBC 174*  IRON 38  RETICCTPCT 2.0   Sepsis Labs: No results for input(s): PROCALCITON, LATICACIDVEN in the last 168 hours.  Recent Results (from the past 240 hour(s))  Resp Panel by RT-PCR (Flu A&B, Covid) Nasopharyngeal Swab     Status: None   Collection Time: 05/29/20  9:49 PM   Specimen: Nasopharyngeal Swab; Nasopharyngeal(NP) swabs in vial transport medium  Result Value Ref Range Status   SARS Coronavirus 2 by RT PCR NEGATIVE NEGATIVE Final    Comment: (NOTE) SARS-CoV-2 target nucleic acids are NOT DETECTED.  The SARS-CoV-2 RNA is generally detectable in upper respiratory specimens during the acute phase of infection. The lowest concentration of SARS-CoV-2 viral copies this assay can detect is 138 copies/mL. A negative result does not preclude SARS-Cov-2 infection and should not be used as the sole basis for treatment or other patient management decisions. A negative result may occur with  improper specimen collection/handling, submission of specimen other than nasopharyngeal swab, presence of viral mutation(s) within the areas targeted by this assay, and inadequate number of  viral copies(<138 copies/mL). A negative result must be combined with clinical observations, patient history, and epidemiological information. The expected result is Negative.  Fact Sheet for Patients:  EntrepreneurPulse.com.au  Fact Sheet for Healthcare Providers:  IncredibleEmployment.be  This test is no t yet approved or cleared by the [place] and  has been authorized for  detection and/or diagnosis of SARS-CoV-2 by FDA under an Emergency Use Authorization (EUA). This EUA will remain  in effect (meaning this test can be used) for the duration of the COVID-19 declaration under Section 564(b)(1) of the Act, 21 U.S.C.section 360bbb-3(b)(1), unless the authorization is terminated  or revoked sooner.       Influenza A by PCR NEGATIVE NEGATIVE Final   Influenza B by PCR NEGATIVE NEGATIVE Final    Comment: (NOTE) The Xpert Xpress SARS-CoV-2/FLU/RSV plus assay is intended as an aid in the diagnosis of influenza from Nasopharyngeal swab specimens and should not be used as a sole basis for treatment. Nasal washings and aspirates are unacceptable for Xpert Xpress SARS-CoV-2/FLU/RSV testing.  Fact Sheet for Patients: EntrepreneurPulse.com.au  Fact Sheet for Healthcare Providers: IncredibleEmployment.be  This test is not yet approved or cleared by the [place] FDA and has been authorized for detection and/or diagnosis of SARS-CoV-2 by FDA under an Emergency Use Authorization (EUA). This EUA will remain in effect (meaning this test can be used) for the duration of the COVID-19 declaration under Section 564(b)(1) of the Act, 21 U.S.C. section 360bbb-3(b)(1), unless the authorization is terminated or revoked.  Performed at [facility], [address] [place], [place] [address]   Culture, blood (x 2)     Status: None   Collection Time: 05/30/20  5:00 PM   Specimen: BLOOD RIGHT ARM   Result Value Ref Range Status   Specimen Description BLOOD RIGHT ARM  Final   Special Requests   Final    BOTTLES DRAWN AEROBIC ONLY Blood Culture results may not be optimal due to an inadequate volume of blood received in culture bottles   Culture   Final    NO GROWTH 5 DAYS Performed at [facility], [place] [address] [place], [place] [address]    Report Status 06/04/2020 FINAL  Final  Culture, blood (x 2)     Status: None   Collection Time: 05/30/20  5:03 PM   Specimen: BLOOD RIGHT ARM  Result Value Ref Range Status   Specimen Description BLOOD RIGHT ARM  Final   Special Requests   Final    BOTTLES DRAWN AEROBIC ONLY Blood Culture results may not be optimal due to an inadequate volume of blood received in culture bottles   Culture   Final    NO GROWTH 5 DAYS Performed at [facility], [place] [address], [place], [place] [address]    Report Status 06/04/2020 FINAL  Final  Surgical pcr screen     Status: None   Collection Time: 05/31/20  8:24 PM   Specimen: Nasal Mucosa; Nasal Swab  Result Value Ref Range Status   MRSA, PCR NEGATIVE NEGATIVE Final   Staphylococcus aureus NEGATIVE NEGATIVE Final    Comment: (NOTE) The Xpert SA Assay (FDA approved for NASAL specimens in patients 75 years of age and older), is one component of a comprehensive surveillance program. It is not intended to diagnose infection nor to guide or monitor treatment. Performed at [facility], [place] [address], [place], [place] [address]      RN Pressure Injury Documentation:     Estimated body mass index is 22.62 kg/m as calculated from the following:   Height as of this encounter: 4' 11" (1.499 m).   Weight as of this encounter: 50.8 kg.  Malnutrition Type:  Nutrition Problem: Inadequate oral intake Etiology: altered GI function   Malnutrition Characteristics:  Signs/Symptoms: NPO status   Nutrition  Interventions:  Interventions: Ensure Enlive (each supplement provides  350kcal and 20 grams of protein)   Radiology Studies: No results found. Scheduled Meds: . acetaminophen  1,000 mg Oral Q6H  . aspirin EC  325 mg Oral Daily  . atorvastatin  40 mg Oral Daily  . carvedilol  12.5 mg Oral BID  . clopidogrel  75 mg Oral Daily  . feeding supplement  237 mL Oral Q1500  . heparin injection (subcutaneous)  5,000 Units Subcutaneous Q8H  . insulin aspart  0-5 Units Subcutaneous QHS  . insulin aspart  0-9 Units Subcutaneous TID WC  . insulin glargine  8 Units Subcutaneous QHS  . levETIRAcetam  500 mg Oral BID  . ramipril  5 mg Oral Daily  . sodium chloride flush  3 mL Intravenous Q12H   Continuous Infusions: . lactated ringers 10 mL/hr at 06/01/20 1243    LOS: 8 days   Kerney Elbe, DO [facility] PAGER is on AMION  If 7PM-7AM, please contact night-coverage [email or web address]

## 2020-06-07 NOTE — Plan of Care (Signed)
  Problem: Activity: Goal: Risk for activity intolerance will decrease Outcome: Progressing   Problem: Nutrition: Goal: Adequate nutrition will be maintained Outcome: Progressing   Problem: Elimination: Goal: Will not experience complications related to bowel motility Outcome: Progressing   Problem: Nutrition Goal: Nutritional status is improving Description: Monitor and assess patient for malnutrition (ex- brittle hair, bruises, dry skin, pale skin and conjunctiva, muscle wasting, smooth red tongue, and disorientation). Collaborate with interdisciplinary team and initiate plan and interventions as ordered.  Monitor patient's weight and dietary intake as ordered or per policy. Utilize nutrition screening tool and intervene per policy. Determine patient's food preferences and provide high-protein, high-caloric foods as appropriate.  Outcome: Not Progressing

## 2020-06-07 NOTE — Plan of Care (Signed)
  Problem: Coping: Goal: Level of anxiety will decrease Outcome: Progressing   Problem: Safety: Goal: Ability to remain free from injury will improve Outcome: Progressing   Problem: Skin Integrity: Goal: Risk for impaired skin integrity will decrease Outcome: Progressing   Problem: Intracerebral Hemorrhage Tissue Perfusion: Goal: Complications of Intracerebral Hemorrhage will be minimized Outcome: Progressing   Problem: Ischemic Stroke/TIA Tissue Perfusion: Goal: Complications of ischemic stroke/TIA will be minimized Outcome: Progressing

## 2020-06-08 DIAGNOSIS — K8020 Calculus of gallbladder without cholecystitis without obstruction: Secondary | ICD-10-CM | POA: Diagnosis not present

## 2020-06-08 LAB — GLUCOSE, CAPILLARY
Glucose-Capillary: 85 mg/dL (ref 70–99)
Glucose-Capillary: 158 mg/dL — ABNORMAL HIGH (ref 70–99)
Glucose-Capillary: 229 mg/dL — ABNORMAL HIGH (ref 70–99)
Glucose-Capillary: 141 mg/dL — ABNORMAL HIGH (ref 70–99)

## 2020-06-08 NOTE — Progress Notes (Signed)
Physical Therapy Treatment Patient Details Name: Taylor Castaneda MRN: [id number] DOB: May 05, 1933 Today's Date: 06/08/2020    History of Present Illness 87 y.o. female presenting after syncopal episode followed by seizure. RUQ ultrasound (+) cholelithiasis w/o acute cholecystitis and cirrhosis s/p ERCP on 12/28 by Dr. Lyndel Safe. Patient also found to have AKI on CKD. CT head (-) for acute findings. Evidence of mild chronic small vessel ischemic disease and moderately advanced cerebral atrophy. MRI (+) intracranial stenosis and multiple new punctate foci of acute ischemia within the right centrum  semiovale. PMHx significant for DMII, HLD, seizures, and Hx colon CA s/p resection.    PT Comments    Pt is making progress towards her goals through ambulating an increased distance with improved RW management and thus safety this date. She was able to keep the RW in contact with the ground throughout her gait bout, but demonstrated a tendency to stay laterally to the L side of the RW and decreased L ankle dorsiflexion with swing when compared to the R even after being cued to correct. Pt demonstrates fatigue with increased gait distance as her speed decreased and she had 1 LOB posteriorly and laterally to the L, resulting in minA to recover. Focused remainder of session on improving lower extremity strength and endurance with exercises. Pt displays L hip flexor and anterior tibialis weakness through decreased quality compared to the R with seated marching and ankle dorsiflexion AROM. Will continue to follow acutely. Current recommendations remain appropriate.  Follow Up Recommendations  CIR     Equipment Recommendations  Rolling walker with 5" wheels    Recommendations for Other Services       Precautions / Restrictions Precautions Precautions: Fall Precaution Comments: monitor for vitals and neuro symptoms Restrictions Weight Bearing Restrictions: No    Mobility  Bed Mobility                General bed mobility comments: Pt sitting up in recliner upon arrival.  Transfers Overall transfer level: Needs assistance Equipment used: Rolling walker (2 wheeled) Transfers: Sit to/from Stand Sit to Stand: Min assist         General transfer comment: MinA for steadying coming to stand, requiring extra time to power up to stand. Initially reaching for RW but required extra time to follow cues to push up from chair for safety purposes instead.  Ambulation/Gait Ambulation/Gait assistance: Min assist Gait Distance (Feet): 180 Feet Assistive device: Rolling walker (2 wheeled) Gait Pattern/deviations: Step-through pattern;Decreased stride length;Trunk flexed;Staggering left;Decreased dorsiflexion - left Gait velocity: reduced Gait velocity interpretation: <1.8 ft/sec, indicate of risk for recurrent falls General Gait Details: Slow, unsteady gait with tendency to remain laterally to L side of RW despite cues to remain within middle of RW. Demonstartes poor L foot clearance with decreased dorisflexion compared to R, cued to correct with min success and extra concentration. 1 LOB posteriorly and laterally to L as pt fatigued, minA to recover.   Stairs             Wheelchair Mobility    Modified Rankin (Stroke Patients Only) Modified Rankin (Stroke Patients Only) Pre-Morbid Rankin Score: No significant disability Modified Rankin: Moderately severe disability     Balance Overall balance assessment: Needs assistance Sitting-balance support: Feet supported;No upper extremity supported Sitting balance-Leahy Scale: Fair Sitting balance - Comments: close supervision for safety   Standing balance support: During functional activity;Bilateral upper extremity supported Standing balance-Leahy Scale: Poor Standing balance comment: Reliant on UE support for balance, 1x LOB  with miNA tor ecover this date.                            Cognition Arousal/Alertness:  Awake/alert Behavior During Therapy: WFL for tasks assessed/performed Overall Cognitive Status: Impaired/Different from baseline Area of Impairment: Awareness;Problem solving;Safety/judgement;Orientation;Attention;Memory;Following commands                 Orientation Level: Disoriented to;Time (stated year was 1922 several times) Current Attention Level: Sustained Memory: Decreased recall of precautions;Decreased short-term memory Following Commands: Follows one step commands consistently;Follows multi-step commands inconsistently Safety/Judgement: Decreased awareness of safety;Decreased awareness of deficits Awareness: Emergent Problem Solving: Slow processing;Requires verbal cues General Comments: Slow processing of cues, requirign pt to stop and pay attention to understand and follow commands. Repeated cues provided to perform ther ex appropriately, with poor carryover.      Exercises General Exercises - Lower Extremity Long Arc Quad: Strengthening;Both;Other reps (comment);Seated (x30 reps) Hip Flexion/Marching: Both;Strengthening;Other reps (comment);Seated (x30 reps, cues to inc L hip flexion and slow speed for improved quality) Toe Raises: Both;15 reps;Seated Mini-Sqauts: Strengthening;10 reps (sit to stand from recliner without UEs on arm rests)    General Comments        Pertinent Vitals/Pain Pain Assessment: No/denies pain    Home Living                      Prior Function            PT Goals (current goals can now be found in the care plan section) Acute Rehab PT Goals Patient Stated Goal: To return home. PT Goal Formulation: With patient/family Time For Goal Achievement: 06/16/20 Potential to Achieve Goals: Good Progress towards PT goals: Progressing toward goals    Frequency    Min 4X/week      PT Plan Current plan remains appropriate    Co-evaluation              AM-PAC PT "6 Clicks" Mobility   Outcome Measure  Help needed  turning from your back to your side while in a flat bed without using bedrails?: A Little Help needed moving from lying on your back to sitting on the side of a flat bed without using bedrails?: A Little Help needed moving to and from a bed to a chair (including a wheelchair)?: A Little Help needed standing up from a chair using your arms (e.g., wheelchair or bedside chair)?: A Little Help needed to walk in hospital room?: A Little Help needed climbing 3-5 steps with a railing? : A Lot 6 Click Score: 17    End of Session Equipment Utilized During Treatment: Gait belt Activity Tolerance: Patient tolerated treatment well Patient left: in chair;with call bell/phone within reach;with chair alarm set;with family/visitor present Nurse Communication: Mobility status;Other (comment) (bleeding behind ear) PT Visit Diagnosis: Unsteadiness on feet (R26.81);Muscle weakness (generalized) (M62.81);Other symptoms and signs involving the nervous system (R29.898);Other abnormalities of gait and mobility (R26.89);Difficulty in walking, not elsewhere classified (R26.2)     Time: 2683-4196 PT Time Calculation (min) (ACUTE ONLY): 23 min  Charges:  $Gait Training: 8-22 mins $Therapeutic Exercise: 8-22 mins                     Moishe Spice, PT, DPT Acute Rehabilitation Services  Pager: [phone] Office: [place] 06/08/2020, 9:45 AM

## 2020-06-08 NOTE — Progress Notes (Signed)
Inpatient Rehab Admissions Coordinator:   I have received insurance auth for CIR admission. I do not have a bed for her today, but will continue to follow for potential admit later this week pending bed availability.   Clemens Catholic, [place], [place] Admissions Coordinator  [phone] ([place]) [phone] (office)

## 2020-06-08 NOTE — Progress Notes (Signed)
Occupational Therapy Treatment Patient Details Name: Taylor Castaneda MRN: [id number] DOB: 1932-10-18 Today's Date: 06/08/2020    History of present illness 87 y.o. female presenting after syncopal episode followed by seizure. RUQ ultrasound (+) cholelithiasis w/o acute cholecystitis and cirrhosis s/p ERCP on 12/28 by Dr. Lyndel Safe. Patient also found to have AKI on CKD. CT head (-) for acute findings. Evidence of mild chronic small vessel ischemic disease and moderately advanced cerebral atrophy. MRI (+) intracranial stenosis and multiple new punctate foci of acute ischemia within the right centrum  semiovale. PMHx significant for DMII, HLD, seizures, and Hx colon CA s/p resection.   OT comments  OT treatment session with focus on functional cognition with emphasis on command following, static/dynamic sitting/standing balance, and BUE HEP. Patient with skin tear and active bleeding to posterior aspect of L ear with RN present at start of session for management. Set-up assist for UB dressing to doff/don new gown. Patient also found to have on TED hose reporting having slept in them. Patient/staff education on removal of TED hose at night. Patient demonstrates functional transfers and short-distance functional mobility in room with use of RW and occasioanl cues for avoidance of obstacles. Patient also following 1-step verbal commands with increased accuracy although she continues to require increased time. Patient demonstrates increased cognition alert and oriented x3 this date. Patient would benefit from continued acute OT services in prep for d/c to next level of care with continued recommendation for CIR.   Follow Up Recommendations  CIR    Equipment Recommendations  None recommended by OT    Recommendations for Other Services      Precautions / Restrictions Precautions Precautions: Fall Precaution Comments: monitor for vitals and neuro symptoms Restrictions Weight Bearing Restrictions: No        Mobility Bed Mobility Overal bed mobility: Needs Assistance             General bed mobility comments: Patient seated in recliner upon entry.  Transfers Overall transfer level: Needs assistance Equipment used: Rolling walker (2 wheeled) Transfers: Sit to/from Stand Sit to Stand: Min assist         General transfer comment: Min A with cues for hand placement.    Balance Overall balance assessment: Needs assistance Sitting-balance support: Feet supported;No upper extremity supported Sitting balance-Leahy Scale: Good Sitting balance - Comments: Patient able to maintain unsupported sitting position during ADLs and ther ex without LOB.   Standing balance support: During functional activity;Bilateral upper extremity supported Standing balance-Leahy Scale: Poor Standing balance comment: Reliant on BUE support on RW.                           ADL either performed or assessed with clinical judgement   ADL                   Upper Body Dressing : Set up;Sitting   Lower Body Dressing: Minimal assistance;Sit to/from stand   Toilet Transfer: Minimal assistance;RW           Functional mobility during ADLs: Minimal assistance;Rolling walker       Vision       Perception     Praxis      Cognition Arousal/Alertness: Awake/alert Behavior During Therapy: WFL for tasks assessed/performed Overall Cognitive Status: Impaired/Different from baseline Area of Impairment: Awareness;Problem solving;Safety/judgement;Orientation;Attention;Memory;Following commands                 Orientation Level: Disoriented to;Time (stated year  was 1922 several times) Current Attention Level: Sustained Memory: Decreased recall of precautions;Decreased short-term memory Following Commands: Follows one step commands consistently;Follows one step commands with increased time Safety/Judgement: Decreased awareness of safety;Decreased awareness of deficits Awareness:  Emergent Problem Solving: Slow processing;Requires verbal cues General Comments: Patient following 1-step verbal commands with increased accuracy than on initial eval. Continues to require increased time.        Exercises Exercises: General Upper Extremity General Exercises - Upper Extremity Shoulder Flexion: AROM;10 reps Elbow Flexion: AROM;10 reps Elbow Extension: AROM;10 reps Wrist Flexion: AROM;10 reps Wrist Extension: AROM;10 reps General Exercises - Lower Extremity Long Arc Quad: Strengthening;Both;Other reps (comment);Seated (x30 reps) Hip Flexion/Marching: Both;Strengthening;Other reps (comment);Seated (x30 reps, cues to inc L hip flexion and slow speed for improved quality) Toe Raises: Both;15 reps;Seated Mini-Sqauts: Strengthening;10 reps (sit to stand from recliner without UEs on arm rests)   Shoulder Instructions       General Comments      Pertinent Vitals/ Pain       Pain Assessment: No/denies pain  Home Living                                          Prior Functioning/Environment              Frequency  Min 2X/week        Progress Toward Goals  OT Goals(current goals can now be found in the care plan section)  Progress towards OT goals: Progressing toward goals  Acute Rehab OT Goals Patient Stated Goal: To return home. OT Goal Formulation: With patient Time For Goal Achievement: 06/17/20 Potential to Achieve Goals: Good ADL Goals Pt Will Perform Upper Body Dressing: sitting;with set-up Pt Will Perform Lower Body Dressing: with supervision;sit to/from stand Pt Will Transfer to Toilet: with supervision;ambulating;grab bars Pt Will Perform Toileting - Clothing Manipulation and hygiene: with supervision;sit to/from stand Additional ADL Goal #1: Patient will be A&Ox4 demonstrating increased cognitive orientation.  Plan Discharge plan remains appropriate;Frequency remains appropriate    Co-evaluation                  AM-PAC OT "6 Clicks" Daily Activity     Outcome Measure   Help from another person eating meals?: None Help from another person taking care of personal grooming?: A Little Help from another person toileting, which includes using toliet, bedpan, or urinal?: A Little Help from another person bathing (including washing, rinsing, drying)?: A Little Help from another person to put on and taking off regular upper body clothing?: A Little Help from another person to put on and taking off regular lower body clothing?: A Little 6 Click Score: 19    End of Session Equipment Utilized During Treatment: Gait belt;Rolling walker  OT Visit Diagnosis: Unsteadiness on feet (R26.81);Muscle weakness (generalized) (M62.81);History of falling (Z91.81);Other symptoms and signs involving cognitive function   Activity Tolerance Patient tolerated treatment well   Patient Left in chair;with call bell/phone within reach;with chair alarm set   Nurse Communication          Time: [phone] OT Time Calculation (min): 18 min  Charges: OT General Charges $OT Visit: 1 Visit OT Treatments $Therapeutic Activity: 8-22 mins  Torrell Krutz H. OTR/L Supplemental OT, Department of rehab services [phone]   Ramie Palladino R H. 06/08/2020, 10:24 AM

## 2020-06-08 NOTE — Progress Notes (Signed)
Inpatient Rehab Admissions Coordinator:   I have not received insurance authorization and do not have a bed to offer Pt. Today. I notified Pt. And she states that she remains interested in CIR but would also consider local SNFs if insurance denies or if CIR does not have a bed. I will notify case manager.  Clemens Catholic, [place], [place] Admissions Coordinator  [phone] ([place]) [phone] (office)

## 2020-06-08 NOTE — H&P (Signed)
Physical Medicine and Rehabilitation Admission H&P    : HPI: Taylor Castaneda is a 87 year old right-handed female history of diabetes mellitus, hyperlipidemia, seizure disorder maintained on Keppra, history of colon cancer status post resection 1998 followed by Dr. Karie Chimera, glaucoma followed by Dr. Herbert Deaner and chronic anemia. Per chart review patient lives alone independent with assistive device. 1 level home 2 steps to entry. She does have good family support. Presented [facility] 05/30/2020 after being found down with complaints of abdominal pain right upper quadrant, epigastrium that radiated around to the ipsilateral scapula and shoulder. There was no nausea vomiting. There was no obvious trauma. There was some question of possible seizure. Cranial CT scan and CT cervical spine showed no evidence of acute abnormality. CT thoracic lumbar cervical spine again showing no acute osseous abnormality identified. There was severe stenosis at L4-5. Ultrasound the abdomen cholelithiasis without significant sonographic evidence of acute cholecystitis. MRI of the abdomen cholelithiasis but again no acute cholecystitis small common bile duct stones distally no common bile duct dilation. Significant pancreatic atrophy. Admission chemistries WBC 17,800, hemoglobin 11.4, glucose 205 BUN 37 creatinine 1.40, lactic acid 4.0, troponin 18, lipase 25, blood cultures no growth to date. Patient underwent ERCP 05/31/2020 per Dr. Lyndel Safe showing choledocholithiasis found status post biliary sphincterotomy, sphincteroplasty and balloon extraction, cholelithiasis without cholecystitis no pus was found pancreatogram was not obtained. General surgery consulted after ERCP completed underwent laparoscopic cholecystectomy 06/01/2020 per Dr. Windle Guard. Neurology consulted EEG negative for seizure. Echocardiogram ejection fraction of 60 to 65% no wall motion abnormalities grade 2 diastolic dysfunction. Patient with acute onset of aphasia again  neurology follow-up 06/02/2020 MRI showing multiple punctate foci of acute ischemia within the right centrum semiovale. No hemorrhage or mass-effect. MRA no large vessel occlusion identified. Patient was cleared to begin aspirin 325 mg and Plavix for CVA prophylaxis x3 months then Plavix alone. Subcutaneous heparin for DVT prophylaxis. Tolerating a regular consistency diet. Due to patient's decreased functional mobility aphasia she was admitted for a comprehensive rehab program.  Review of Systems  Constitutional: Negative for chills and fever.  HENT: Negative for hearing loss.   Eyes: Negative for blurred vision and double vision.  Respiratory: Negative for cough and shortness of breath.   Cardiovascular: Positive for leg swelling. Negative for chest pain and palpitations.  Gastrointestinal: Positive for abdominal pain. Negative for nausea and vomiting.  Genitourinary: Negative for dysuria, flank pain and hematuria.  Musculoskeletal: Positive for myalgias.  Skin: Negative for rash.  Neurological: Positive for speech change, seizures and weakness.  All other systems reviewed and are negative.  Past Medical History:  Diagnosis Date  . Anemia   . Diabetes mellitus, type II ([place])   . Glaucoma    Dr.Hecker  . History of colon cancer 1998   Dr. Lennie Hummer  . Hyperlipemia   . Osteopenia 02/2012, 03/2014   DEXA hip -2.2  . Renal insufficiency   . Seizures ([place])   . Stenosing tenosynovitis of finger of left hand 2016   index - s/p steroid injection x2   Past Surgical History:  Procedure Laterality Date  . ABI  11/2013   WNL  . APPENDECTOMY    . BILIARY DILATION  05/31/2020   Procedure: BILIARY DILATION;  Surgeon: Jackquline Denmark, MD;  Location: [facility] ENDOSCOPY;  Service: Endoscopy;;  . BIOPSY  05/31/2020   Procedure: BIOPSY;  Surgeon: Jackquline Denmark, MD;  Location: [place];  Service: Endoscopy;;  . CARPAL TUNNEL RELEASE     Bilateral   .  CATARACT EXTRACTION Bilateral 2006   Implants   . CHOLECYSTECTOMY N/A 06/01/2020   Procedure: LAPAROSCOPIC CHOLECYSTECTOMY;  Surgeon: Clovis Riley, MD;  Location: [place];  Service: General;  Laterality: N/A;  . COLECTOMY  1998   For cancer  . COLONOSCOPY  2008   Dr.Stark, [place] 2011  . COLONOSCOPY  06/2013   1 hyperplastic polyp, ileocecal anastomosis [facility])  . dexa  02/2012, 03/2014   T score -2.2 at hip overall stable  . ENDOSCOPIC RETROGRADE CHOLANGIOPANCREATOGRAPHY (ERCP) WITH PROPOFOL N/A 05/31/2020   Procedure: ENDOSCOPIC RETROGRADE CHOLANGIOPANCREATOGRAPHY (ERCP) WITH PROPOFOL;  Surgeon: Jackquline Denmark, MD;  Location: [facility] ENDOSCOPY;  Service: Endoscopy;  Laterality: N/A;  . REFRACTIVE SURGERY    . REMOVAL OF STONES  05/31/2020   Procedure: REMOVAL OF STONES;  Surgeon: Jackquline Denmark, MD;  Location: [facility] ENDOSCOPY;  Service: Endoscopy;;  . [name]  05/31/2020   Procedure: [name];  Surgeon: Jackquline Denmark, MD;  Location: [facility] ENDOSCOPY;  Service: Endoscopy;;  . TONSILLECTOMY AND ADENOIDECTOMY    . TOTAL KNEE ARTHROPLASTY Left 12/17/2016   Procedure: LEFT TOTAL KNEE ARTHROPLASTY;  Surgeon: Gaynelle Arabian, MD;  Location: [facility];  Service: Orthopedics;  Laterality: Left;   Family History  Problem Relation Age of Onset  . Heart attack Father 84  . Stroke Father 38  . Diabetes Father   . Hypertension Father   . Heart disease Father        before age 52  . Breast cancer Mother   . Cancer Mother   . Diabetes Sister   . Cancer Sister   . Peripheral vascular disease Sister   . Breast cancer Sister   . Hypertension Son   . COPD Neg Hx   . Asthma Neg Hx   . Colon cancer Neg Hx   . Esophageal cancer Neg Hx   . Rectal cancer Neg Hx   . Stomach cancer Neg Hx    Social History:  reports that she quit smoking about 47 years ago. Her smoking use included cigarettes. She quit after 2.00 years of use. She has never used smokeless tobacco. She reports previous alcohol use. She reports that she does not use drugs. Allergies:   Allergies  Allergen Reactions  . Gabapentin Other (See Comments)    Dizziness  . Pravastatin Sodium Other (See Comments)    "Anemia," per Dr Gilford Rile, [facility]   Medications Prior to Admission  Medication Sig Dispense Refill  . acetaminophen (TYLENOL) 325 MG tablet Take 325-650 mg by mouth every 6 (six) hours as needed (for pain).    Marland Kitchen aspirin 81 MG EC tablet Take 1 tablet (81 mg total) by mouth daily. Swallow whole. 30 tablet 0  . atorvastatin (LIPITOR) 40 MG tablet Take 1 tablet (40 mg total) by mouth daily at 6 PM. 90 tablet 3  . Biotin 10000 MCG TABS Take 10,000 mcg by mouth daily.    . Carboxymethylcellul-Glycerin 1-0.25 % SOLN Place 1-2 drops into both eyes 3 (three) times daily as needed (for dry/irritated eyes.).    Marland Kitchen carvedilol (COREG) 12.5 MG tablet Take 1 tablet (12.5 mg total) by mouth 2 (two) times daily. 180 tablet 3  . Cholecalciferol (VITAMIN D3) 25 MCG (1000 UT) CAPS Take 1 capsule (1,000 Units total) by mouth daily. (Patient taking differently: Take 2,000 Units by mouth daily.) 30 capsule   . Cinnamon 500 MG capsule Take 1,000 mg by mouth daily.    . colchicine 0.6 MG tablet Take 1 tablet (0.6 mg total) by mouth daily  as needed (gout flare). First day of gout flare, may take 1 tablet twice daily. (Patient taking differently: Take 0.6 mg by mouth daily as needed (for gout flares, and may take 0.6 mg TWICE DAILY on 1st day of flare).) 30 tablet 3  . Ferrous Sulfate (IRON) 325 (65 Fe) MG TABS Take 325 mg by mouth See admin instructions. Take 325 mg by mouth in the evening on Mondays and Thursdays only    . furosemide (LASIX) 20 MG tablet Take 1 tablet by mouth once daily (Patient taking differently: Take 20 mg by mouth in the morning.) 90 tablet 3  . HUMALOG KWIKPEN 100 UNIT/ML KiwkPen Inject 1-10 Units into the skin See admin instructions. Inject 1-10 units into the skin three times a day with meals, PER SLIDING SCALE  4  . LANTUS SOLOSTAR 100 UNIT/ML Solostar Pen Inject 16  Units into the skin at bedtime.    . levETIRAcetam (KEPPRA) 250 MG tablet Take 1 tablet by mouth twice daily (Patient taking differently: Take 250 mg by mouth 2 (two) times daily.) 180 tablet 1  . Multiple Vitamins-Minerals (PRESERVISION AREDS 2 PO) Take 1 capsule by mouth 2 (two) times daily.    . potassium chloride (KLOR-CON) 10 MEQ tablet TAKE 1 TABLET BY MOUTH ON MONDAY, [facility] AND FRIDAY (Patient taking differently: Take 10 mEq by mouth every Monday, Wednesday, and Friday.) 36 tablet 3  . ramipril (ALTACE) 5 MG capsule Take 1 capsule by mouth once daily (Patient taking differently: Take 5 mg by mouth daily.) 90 capsule 3  . B-D ULTRAFINE III SHORT PEN 31G X 8 MM MISC USE AS DIRECTED WITH INSULIN 100 each 3    Drug Regimen Review Drug regimen was reviewed and remains appropriate with no significant issues identified  Home: Home Living Family/patient expects to be discharged to:: Other (Comment) (Apartment style condo 2nd floor with elevator access.) Living Arrangements: Alone Available Help at Discharge: Family,Available PRN/intermittently Type of Home: Other(Comment) (condo) Home Access: Scientist, research (physical sciences) of Steps: 2 Entrance Stairs-Rails: Can reach both Home Layout: One level Bathroom Shower/Tub: Multimedia programmer: Standard Bathroom Accessibility: Yes Home Equipment: Walker - 2 wheels,Cane - single point,Grab bars - tub/shower,Shower seat Additional Comments: has a great deal of equipment from caring for her husband  Lives With: Alone   Functional History: Prior Function Level of Independence: Independent,Independent with assistive device(s) Comments: Intermittent use of SPC in home. Use of RW in community dwellings.  Functional Status:  Mobility: Bed Mobility Overal bed mobility: Needs Assistance Bed Mobility: Supine to Sit Supine to sit: HOB elevated,Min assist Sit to supine: Min assist General bed mobility comments: Patient seated in  recliner upon entry. Transfers Overall transfer level: Needs assistance Equipment used: Rolling walker (2 wheeled) Transfers: Sit to/from Stand Sit to Stand: Min assist General transfer comment: Min A with cues for hand placement. Ambulation/Gait Ambulation/Gait assistance: Min assist Gait Distance (Feet): 180 Feet Assistive device: Rolling walker (2 wheeled) Gait Pattern/deviations: Step-through pattern,Decreased stride length,Trunk flexed,Staggering left,Decreased dorsiflexion - left General Gait Details: Slow, unsteady gait with tendency to remain laterally to L side of RW despite cues to remain within middle of RW. Demonstartes poor L foot clearance with decreased dorisflexion compared to R, cued to correct with min success and extra concentration. 1 LOB posteriorly and laterally to L as pt fatigued, minA to recover. Gait velocity: reduced Gait velocity interpretation: <1.8 ft/sec, indicate of risk for recurrent falls    ADL: ADL Overall ADL's : Needs assistance/impaired Grooming: Minimal  assistance,Standing Upper Body Dressing : Set up,Sitting Lower Body Dressing: Minimal assistance,Sit to/from stand Toilet Transfer: Minimal assistance,RW Toileting- Clothing Manipulation and Hygiene: Minimal assistance,Sit to/from stand Functional mobility during ADLs: Minimal assistance,Rolling walker General ADL Comments: Min A for safety with cues for walker management and proximity. Patient frequently bumping into furniture in room on R and L.  Cognition: Cognition Overall Cognitive Status: Impaired/Different from baseline Arousal/Alertness: Awake/alert Orientation Level: Oriented to person,Oriented to place,Oriented to situation Memory: Impaired (recalled 2 of 4 words after 5 minute delay; 4/4 with cue) Memory Impairment: Storage deficit Awareness: Appears intact Problem Solving: Appears intact Safety/Judgment: Appears intact Cognition Arousal/Alertness: Awake/alert Behavior During  Therapy: WFL for tasks assessed/performed Overall Cognitive Status: Impaired/Different from baseline Area of Impairment: Awareness,Problem solving,Safety/judgement,Orientation,Attention,Memory,Following commands Orientation Level: Disoriented to,Time (stated year was 1922 several times) Current Attention Level: Sustained Memory: Decreased recall of precautions,Decreased short-term memory Following Commands: Follows one step commands consistently,Follows one step commands with increased time Safety/Judgement: Decreased awareness of safety,Decreased awareness of deficits Awareness: Emergent Problem Solving: Slow processing,Requires verbal cues General Comments: Patient following 1-step verbal commands with increased accuracy than on initial eval. Continues to require increased time.  Physical Exam: Blood pressure (!) 179/71, pulse 68, temperature 98.1 F (36.7 C), temperature source Oral, resp. rate 18, height 4\' 11"  (1.499 m), weight 50.8 kg, SpO2 99 %. Physical Exam Constitutional:      Comments: Frail appearing  HENT:     Head: Normocephalic and atraumatic.     Right Ear: External ear normal.     Left Ear: External ear normal.     Nose: Nose normal. No congestion.     Mouth/Throat:     Mouth: Mucous membranes are moist.  Eyes:     Extraocular Movements: Extraocular movements intact.     Pupils: Pupils are equal, round, and reactive to light.  Cardiovascular:     Rate and Rhythm: Normal rate and regular rhythm.     Heart sounds: No murmur heard. No gallop.   Pulmonary:     Effort: Pulmonary effort is normal. No respiratory distress.     Breath sounds: No wheezing.  Abdominal:     General: Bowel sounds are normal. There is no distension.     Palpations: Abdomen is soft.     Tenderness: There is no abdominal tenderness. There is no guarding.     Comments: Cholecystectomy site clean and dry  Musculoskeletal:        General: No swelling or deformity. Normal range of motion.      Cervical back: Normal range of motion and neck supple.  Skin:    Comments: Large bleeding laceration right wrist with copious amount of kerlix on wrist to absorb.   Neurological:     Mental Status: She is alert.     Comments: Patient is alert sitting up in chair. Makes eye contact with examiner. She does have some expressive aphasia but can speak in small phrases with extra time. Able to ID and say "watch". Read label on soda can with extra time and cues. Tried to say "stethescope". Comprehension seems intact. Able to tell me her home after self-correcting. Motor: 4+/5 RUE and 4/5 LUE. RLE 4/5 LLE 4- to 4/5. Senses pain and LT in all 4's.   Psychiatric:        Mood and Affect: Mood normal.        Behavior: Behavior normal.     Results for orders placed or performed during the hospital encounter of 05/30/20 (from the  past 48 hour(s))  Glucose, capillary     Status: Abnormal   Collection Time: 06/06/20 11:49 AM  Result Value Ref Range   Glucose-Capillary 165 (H) 70 - 99 mg/dL    Comment: Glucose reference range applies only to samples taken after fasting for at least 8 hours.   Comment 1 Notify RN    Comment 2 Document in Chart   Glucose, capillary     Status: Abnormal   Collection Time: 06/06/20  4:12 PM  Result Value Ref Range   Glucose-Capillary 169 (H) 70 - 99 mg/dL    Comment: Glucose reference range applies only to samples taken after fasting for at least 8 hours.  Glucose, capillary     Status: Abnormal   Collection Time: 06/06/20  9:15 PM  Result Value Ref Range   Glucose-Capillary 155 (H) 70 - 99 mg/dL    Comment: Glucose reference range applies only to samples taken after fasting for at least 8 hours.   Comment 1 Notify RN    Comment 2 Document in Chart   Glucose, capillary     Status: Abnormal   Collection Time: 06/07/20  6:01 AM  Result Value Ref Range   Glucose-Capillary 114 (H) 70 - 99 mg/dL    Comment: Glucose reference range applies only to samples taken after  fasting for at least 8 hours.   Comment 1 Notify RN    Comment 2 Document in Chart   CBC with Differential/Platelet     Status: Abnormal   Collection Time: 06/07/20  6:30 AM  Result Value Ref Range   WBC 9.0 4.0 - 10.5 K/uL   RBC 2.81 (L) 3.87 - 5.11 MIL/uL   Hemoglobin 9.0 (L) 12.0 - 15.0 g/dL   HCT 28.1 (L) 36.0 - 46.0 %   MCV 100.0 80.0 - 100.0 fL   MCH 32.0 26.0 - 34.0 pg   MCHC 32.0 30.0 - 36.0 g/dL   RDW 13.9 11.5 - 15.5 %   Platelets 171 150 - 400 K/uL   nRBC 0.0 0.0 - 0.2 %   Neutrophils Relative % 52 %   Neutro Abs 4.7 1.7 - 7.7 K/uL   Lymphocytes Relative 28 %   Lymphs Abs 2.5 0.7 - 4.0 K/uL   Monocytes Relative 10 %   Monocytes Absolute 0.9 0.1 - 1.0 K/uL   Eosinophils Relative 6 %   Eosinophils Absolute 0.6 (H) 0.0 - 0.5 K/uL   Basophils Relative 0 %   Basophils Absolute 0.0 0.0 - 0.1 K/uL   Immature Granulocytes 4 %   Abs Immature Granulocytes 0.35 (H) 0.00 - 0.07 K/uL    Comment: Performed at [facility], [address] [place], [place] [address]  Magnesium     Status: None   Collection Time: 06/07/20  6:30 AM  Result Value Ref Range   Magnesium 1.9 1.7 - 2.4 mg/dL    Comment: Performed at [facility] [address], [place], [place] [address]  Comprehensive metabolic panel     Status: Abnormal   Collection Time: 06/07/20  6:30 AM  Result Value Ref Range   Sodium 142 135 - 145 mmol/L   Potassium 4.2 3.5 - 5.1 mmol/L   Chloride 115 (H) 98 - 111 mmol/L   CO2 18 (L) 22 - 32 mmol/L   Glucose, Bld 128 (H) 70 - 99 mg/dL    Comment: Glucose reference range applies only to samples taken after fasting for at least 8 hours.   BUN  21 8 - 23 mg/dL   Creatinine, Ser 1.03 (H) 0.44 - 1.00 mg/dL   Calcium 8.7 (L) 8.9 - 10.3 mg/dL   Total Protein 5.0 (L) 6.5 - 8.1 g/dL   Albumin 2.3 (L) 3.5 - 5.0 g/dL   AST 29 15 - 41 U/L   ALT 36 0 - 44 U/L   Alkaline Phosphatase 171 (H) 38 - 126 U/L   Total Bilirubin 0.5 0.3 - 1.2 mg/dL   GFR, Estimated 53 (L) >60  mL/min    Comment: (NOTE) Calculated using the CKD-EPI Creatinine Equation (2021)    Anion gap 9 5 - 15    Comment: Performed at [facility], [place] [address] [place], [place] [address]  Phosphorus     Status: None   Collection Time: 06/07/20  6:30 AM  Result Value Ref Range   Phosphorus 4.2 2.5 - 4.6 mg/dL    Comment: Performed at [facility] [address], [place], [place] [address]  Glucose, capillary     Status: Abnormal   Collection Time: 06/07/20 12:02 PM  Result Value Ref Range   Glucose-Capillary 194 (H) 70 - 99 mg/dL    Comment: Glucose reference range applies only to samples taken after fasting for at least 8 hours.  Glucose, capillary     Status: Abnormal   Collection Time: 06/07/20  6:00 PM  Result Value Ref Range   Glucose-Capillary 171 (H) 70 - 99 mg/dL    Comment: Glucose reference range applies only to samples taken after fasting for at least 8 hours.  Glucose, capillary     Status: Abnormal   Collection Time: 06/07/20  9:05 PM  Result Value Ref Range   Glucose-Capillary 142 (H) 70 - 99 mg/dL    Comment: Glucose reference range applies only to samples taken after fasting for at least 8 hours.   Comment 1 Notify RN    Comment 2 Document in Chart   Glucose, capillary     Status: None   Collection Time: 06/08/20  6:01 AM  Result Value Ref Range   Glucose-Capillary 85 70 - 99 mg/dL    Comment: Glucose reference range applies only to samples taken after fasting for at least 8 hours.   Comment 1 Notify RN    Comment 2 Document in Chart    No results found.     Medical Problem List and Plan: 1. Decreased functional ability with aphasia secondary to several punctate right centrum semiovale infarct likely due to severe intracranial stenosis  -patient may  Shower with right wrist covered.  -ELOS/Goals: 7-10 days, Mod I with PT, OT and min assist with SLP 2.  Antithrombotics: -DVT/anticoagulation: Subcutaneous heparin  -antiplatelet therapy: Aspirin  325 mg daily and Plavix 75 mg daily x3 months then Plavix alone 3. Pain Management: Tramadol as needed 4. Mood: Provide emotional support  -antipsychotic agents: N/A 5. Neuropsych: This patient is capable of making decisions on her own behalf. 6. Skin/Wound Care: Routine skin checks 7. Fluids/Electrolytes/Nutrition: Routine in and outs with follow-up chemistries 8. Cholelithiasis/choledocholithiasis. Status post ERCP followed by laparoscopic cholecystectomy 06/01/2020  -pt denies any pain at present 9. Seizure disorder. Keppra 500 mg twice daily. EEG negative 10. Hypertension. Altace 5 mg daily, Coreg 12.5 mg twice daily.   -Monitor with increased mobility 11. Diabetes mellitus. Hemoglobin A1c 8.7. Lantus insulin 8 units nightly.   -Check blood sugars before meals and at bedtime 12. Hyperlipidemia. Lipitor 13. History of colon cancer with resection 1998. Follow-up  outpatient    Lavon Paganini Angiulli, PA-C 06/08/2020

## 2020-06-08 NOTE — NC FL2 (Signed)
[place] MEDICAID FL2 LEVEL OF CARE SCREENING TOOL     IDENTIFICATION  Patient Name: Taylor Castaneda Birthdate: May 17, 1933 Sex: female Admission Date (Current Location): 05/30/2020  [facility] and [place] Number:  Engineering geologist and Address:  [facility], [place] [address] [place], [place] [address]      Provider Number: [id number]  Attending Physician Name and Address:  Dessa Phi, DO  Relative Name and Phone Number:       Current Level of Care: Hospital Recommended Level of Care: [place] Prior Approval Number:    Date Approved/Denied:   PASRR Number: [id number]  Discharge Plan: SNF    Current Diagnoses: Patient Active Problem List   Diagnosis Date Noted  . Cerebral thrombosis with cerebral infarction 06/03/2020  . Common bile duct (CBD) obstruction   . Choledocholithiasis 05/30/2020  . Cholelithiasis 05/30/2020  . Hyperbilirubinemia 05/30/2020  . Normocytic anemia 05/30/2020  . Transaminitis 05/30/2020  . Pancreatic duct dilated 05/30/2020  . Dry age-related macular degeneration 05/06/2020  . Secondary hyperparathyroidism of renal origin ([place]) 02/20/2020  . Anhedonia 02/20/2020  . Seizure ([place]) 03/18/2019  . Transient atrial fibrillation ([place]) 03/18/2019  . MCI (mild cognitive impairment) 03/18/2019  . History of gout 04/29/2017  . OA (osteoarthritis) of knee 12/17/2016  . Dry skin dermatitis 06/13/2016  . Health maintenance examination 01/30/2016  . Medicare annual wellness visit, subsequent 03/10/2014  . Advanced care planning/counseling discussion 03/10/2014  . PAD (peripheral artery disease) ([place]) 11/11/2013  . Dyspnea 09/18/2011  . PMR (polymyalgia rheumatica) ([place]) 08/27/2011  . Controlled type 2 diabetes mellitus with diabetic nephropathy, with long-term current use of insulin ([place]) 08/04/2009  . Essential hypertension 06/13/2009  . CKD (chronic kidney disease), stage III ([place]) 01/24/2009  . Primary  angle-closure glaucoma 11/13/2007  . Osteopenia 11/13/2007  . History of colon cancer 11/13/2007  . Hyperlipidemia associated with type 2 diabetes mellitus ([place]) 10/23/2006    Orientation RESPIRATION BLADDER Height & Weight     Time,Self,Situation,Place  Normal Continent Weight: 112 lb (50.8 kg) Height:  4\' 11"  (149.9 cm)  BEHAVIORAL SYMPTOMS/MOOD NEUROLOGICAL BOWEL NUTRITION STATUS    Convulsions/Seizures Continent Diet (carb modified)  AMBULATORY STATUS COMMUNICATION OF NEEDS Skin   Limited Assist Verbally Surgical wounds (closed abdomen, liquid skin adhesive)                       Personal Care Assistance Level of Assistance  Bathing,Feeding,Dressing Bathing Assistance: Limited assistance Feeding assistance: Independent Dressing Assistance: Limited assistance     Functional Limitations Info             SPECIAL CARE FACTORS FREQUENCY  PT (By licensed PT),OT (By licensed OT)     PT Frequency: 5x/wk OT Frequency: 5x/wk            Contractures Contractures Info: Not present    Additional Factors Info  Code Status,Allergies,Insulin Sliding Scale Code Status Info: Full Allergies Info: Gabapentin, Pravastatin Sodium   Insulin Sliding Scale Info: 0-9 units 3x/day with meals; 0-5 units daily at bed; Lantus 8 units at bed       Current Medications (06/08/2020):  This is the current hospital active medication list Current Facility-Administered Medications  Medication Dose Route Frequency Provider Last Rate Last Admin  . acetaminophen (TYLENOL) tablet 1,000 mg  1,000 mg Oral Q6H PRN Sheikh, Omair Latif, DO      . albuterol (PROVENTIL) (2.5 MG/3ML) 0.083% nebulizer solution 2.5 mg  2.5 mg Nebulization Q6H  PRN Clovis Riley, MD      . aspirin EC tablet 325 mg  325 mg Oral Daily Raiford Noble [place], DO   325 mg at 06/08/20 1003  . atorvastatin (LIPITOR) tablet 40 mg  40 mg Oral Daily Raiford Noble [place], DO   40 mg at 06/08/20 1004  . carvedilol (COREG) tablet  12.5 mg  12.5 mg Oral BID Romana Juniper A, MD   12.5 mg at 06/08/20 1003  . clopidogrel (PLAVIX) tablet 75 mg  75 mg Oral Daily Rosalin Hawking, MD   75 mg at 06/08/20 1003  . feeding supplement (ENSURE ENLIVE / ENSURE PLUS) liquid 237 mL  237 mL Oral Q1500 Raiford Noble Latif, DO   237 mL at 06/07/20 1454  . fentaNYL (SUBLIMAZE) injection 12.5 mcg  12.5 mcg Intravenous Q4H PRN Romana Juniper A, MD   12.5 mcg at 06/01/20 1541  . heparin injection 5,000 Units  5,000 Units Subcutaneous Q8H Raiford Noble [place], DO   5,000 Units at 06/08/20 3474  . hydrALAZINE (APRESOLINE) injection 10 mg  10 mg Intravenous Q6H PRN Raiford Noble [place], DO   10 mg at 06/04/20 2038  . insulin aspart (novoLOG) injection 0-5 Units  0-5 Units Subcutaneous QHS Raiford Noble [place], [place]   2 Units at 06/04/20 2201  . insulin aspart (novoLOG) injection 0-9 Units  0-9 Units Subcutaneous TID WC Raiford Noble [place], DO   2 Units at 06/07/20 1823  . insulin glargine (LANTUS) injection 8 Units  8 Units Subcutaneous QHS Clovis Riley, MD   8 Units at 06/07/20 2137  . lactated ringers infusion   Intravenous Continuous Clovis Riley, MD 10 mL/hr at 06/01/20 1243 New Bag at 06/01/20 1347  . levETIRAcetam (KEPPRA) tablet 500 mg  500 mg Oral BID Rosalin Hawking, MD   500 mg at 06/08/20 1003  . ondansetron (ZOFRAN) tablet 4 mg  4 mg Oral Q6H PRN Clovis Riley, MD       Or  . ondansetron (ZOFRAN) injection 4 mg  4 mg Intravenous Q6H PRN Romana Juniper A, MD      . polyvinyl alcohol (LIQUIFILM TEARS) 1.4 % ophthalmic solution 1 drop  1 drop Both Eyes TID PRN Clovis Riley, MD   1 drop at 06/01/20 1704  . ramipril (ALTACE) capsule 5 mg  5 mg Oral Daily Romana Juniper A, MD   5 mg at 06/08/20 1003  . sodium chloride flush (NS) 0.9 % injection 3 mL  3 mL Intravenous Q12H Romana Juniper A, MD   3 mL at 06/08/20 1005  . traMADol (ULTRAM) tablet 50-100 mg  50-100 mg Oral Q12H PRN Clovis Riley, MD         Discharge  Medications: Please see discharge summary for a list of discharge medications.  Relevant Imaging Results:  Relevant Lab Results:   Additional Information SS#: [id number]  Geralynn Ochs, LCSW

## 2020-06-08 NOTE — Plan of Care (Signed)
  Problem: Elimination: Goal: Will not experience complications related to bowel motility Outcome: Progressing Goal: Will not experience complications related to urinary retention Outcome: Progressing   Problem: Safety: Goal: Ability to remain free from injury will improve Outcome: Progressing   Problem: Ischemic Stroke/TIA Tissue Perfusion: Goal: Complications of ischemic stroke/TIA will be minimized Outcome: Progressing

## 2020-06-08 NOTE — Progress Notes (Signed)
PROGRESS NOTE    Taylor Castaneda  RCV:[id number] DOB: February 09, 1933 DOA: 05/30/2020 PCP: [name], MD     Brief Narrative:  Taylor Castaneda a 87 y.o.femalewith medical history significant ofdiabetes mellitus type 2, hyperlipidemia, seizure disorder, historycolon cancers/presection,and anemia presented with complaints ofpassing out while at her condo. The patient's daughter found her on the floor and was able to help her get to a chair. However when EMS arrived patient had a witnessed generalized tonic-clonic seizure with urinary incontinence lasting approximately 1 minute prior to self resolving. They did not have to give any abortive medications. She had been compliant with her medications and last reported having a seizure over a year ago. At baseline patient lives alone and handles all of her own medications. Patient had not been feeling well earlier in the day with complaints of epigastric abdominal pain with radiation to her back. Denied any recent trauma prior to onset of symptoms. Symptoms lasted approximately 6 to 7 hours prior to self resolving. She had taken Tums and Tylenol prior to coming into the hospital. CT scan of the head and cervical spine did not note any acute abnormalities. CT imaging of the thoracic and lumbar spine noted severe spinal stenosis at L4-5andlarge nonobstructing left renal stone.Bowel sounds were noted to be stable. Labs at the outside facility yesterdayhadbeen significant for WBC 17.8, hemoglobin 11.4, BUN 37, creatinine 1.4, alkaline phosphatase 283, AST 315, ALT 235, total bilirubin 3.3 and lactic acid initially 4->2.6.COVID-19 and influenza screening was negative. Right upper quadrant ultrasound revealed cholelithiasis without evidence of acute cholecystitis and cirrhosis. Case had been discussed with GI at [facility] who recommended MRCP which revealed cholelithiasis without signs of acute cholecystitis, suspected small common bile  duct stones distally with no duct dilatation, and pancreatic atrophy with dilatation in the mid body tail region with abrupt transition point concerning for possible stricture. She had been given Keppra 1000 mg IV and 500 mL of normal saline IV fluids. Patient had been accepted to the hospitalist service to a medical telemetry bed. Repeat labs revealed WBC 25.6, hemoglobin 10.7,alkaline phosphatase 270, AST 219, ALT 207, and total bilirubin 3.7, lactic acid 2.2.  Patient underwent an ERCP and she had a periampullary diverticulum and she was noted to have choledocholithiasis which is status post biliary sphincterotomy and sphincteroplasty and balloon extraction.  She had cholelithiasis without cholecystitis and she had a mild gastritis.  Pancreatogram was not obtained and GI recommended watching for pancreatitis, bleeding or perforation and cholangitis.  Recommend a clear liquid diet and continue current medications and continue antibiotics for 5 days total and stopping the Flagyl.  He also recommended obtaining a surgical consultation while inpatient for questionable lap chole and following the results of the CA 19-9, CEA, IgG for.  Dr. Lyndel Safe feels that she will require an EUS as an outpatient for evaluation of pancreatic duct stricture.   General surgery was consulted on 05/31/2020 and after discussion with the patient they recommended a cholecystectomy which the patient was agreeable to.  Patient underwent a cholecystectomy on 06/01/2020 and is doing well postoperatively.  This morning she was slated to be discharged however started having some word finding difficulties and could not articulate her words properly.  Code stroke was called.  Neurology evaluated and symptoms had resolved but they felt she still needed further imaging.   MRI done and showed multiple punctate foci of acute ischemia within the right centrum semiovale with no hemorrhages or mass-effect seen.  Neurology recommended full stroke  work-up and she ended up going for an MRA of the head without contrast, echocardiogram as well as carotid dopplers.  Neurology felt that she had an acute CVA versus subacute CVA and recommended aspirin and Plavix 325 mg and 75 mg p.o. respectively for 3 months and then just Plavix alone given her severe intracranial stenosis.  PT OT now recommending CIR.  New events last 24 hours / Subjective: Patient sitting in a recliner.  No physical complaints this morning.  Family is at bedside.  Awaiting to hear back from CIR regarding placement  Assessment & Plan:   Principal Problem:   Cholelithiasis Active Problems:   Hyperlipidemia associated with type 2 diabetes mellitus (HCC)   CKD (chronic kidney disease), stage III (HCC)   Hyperbilirubinemia   Normocytic anemia   Transaminitis   Pancreatic duct dilated   Common bile duct (CBD) obstruction   Cerebral thrombosis with cerebral infarction   Choledocholithiasis -Status post ERCP 05/31/2020, status post cholecystectomy 06/01/2020 -Patient to follow-up with Dr. Kae Heller as an outpatient  Acute CVA -Had episode of word finding difficulty on 12/30 -Neurology has recommended aspirin 325 mg daily, Plavix 75 mg daily for 3 months, then Plavix alone -Continue lipitor  -Follow-up with neurology Dr. Leta Baptist in 4 weeks  Pancreatic duct dilatation -Follow-up outpatient with [facility] Dr. Lyndel Safe for outpatient evaluation. CA 19-9 <2  Seizure disorder -Neurology has increased patient's medication to Keppra 500 mg twice daily -Follow-up with neurology Dr. Leta Baptist  Essential hypertension with now orthostatic hypotension -Remains on Coreg, ramipril.  Lasix remains on hold -Orthostatic vital sign pending today   Paroxysmal atrial fibrillation -Not on anticoagulation due to fall risk -Follow-up with cardiology as outpatient  AKI on CKD stage IIIb -Resolved  Diabetes mellitus type 2, uncontrolled with hyperglycemia -Hemoglobin A1c  7.9 -Lantus, sliding scale insulin     DVT prophylaxis:  heparin injection 5,000 Units Start: 06/05/20 1400 Place TED hose Start: 06/03/20 1732 Place TED hose Start: 06/03/20 1726 Place and maintain sequential compression device Start: 05/30/20 1629  Code Status: Full code Family Communication: At bedside Disposition Plan:  Status is: Inpatient  Remains inpatient appropriate because:Unsafe d/c plan   Dispo: The patient is from: Home              Anticipated d/c is to: CIR              Anticipated d/c date is: 1 day              Patient currently is medically stable to d/c.  Awaiting insurance authorization as well as bed availability.    Antimicrobials:  Anti-infectives (From admission, onward)   Start     Dose/Rate Route Frequency Ordered Stop   05/30/20 1615  cefTRIAXone (ROCEPHIN) 2 g in sodium chloride 0.9 % 100 mL IVPB  Status:  Discontinued        2 g 200 mL/hr over 30 Minutes Intravenous Every 24 hours 05/30/20 1516 06/06/20 0825   05/30/20 1615  metroNIDAZOLE (FLAGYL) tablet 500 mg  Status:  Discontinued        500 mg Oral Every 8 hours 05/30/20 1516 05/31/20 1611       Objective: Vitals:   06/07/20 2330 06/08/20 0312 06/08/20 0802 06/08/20 1127  BP: (!) 155/64 (!) 147/69 (!) 179/71 (!) 186/65  Pulse: 69 66 68 62  Resp: 18 18 18 20   Temp: 97.9 F (36.6 C) 98 F (36.7 C) 98.1 F (36.7 C) 97.6 F (36.4 C)  TempSrc: Oral  Oral Oral Oral  SpO2: 100% 99% 99% 100%  Weight:      Height:       No intake or output data in the 24 hours ending 06/08/20 1254 Filed Weights   05/30/20 1645 05/31/20 0912 06/01/20 1045  Weight: 50.8 kg 50.8 kg 50.8 kg    Examination:  General exam: Appears calm and comfortable  Respiratory system: Clear to auscultation. Respiratory effort normal. No respiratory distress. No conversational dyspnea.  Cardiovascular system: S1 & S2 heard, RRR. No murmurs. No pedal edema. Gastrointestinal system: Abdomen is nondistended, soft and  nontender. Normal bowel sounds heard. Central nervous system: Alert and oriented. +continued word-finding difficulties Extremities: Symmetric in appearance  Skin: No rashes, lesions or ulcers on exposed skin  Psychiatry: Judgement and insight appear normal. Mood & affect appropriate.   Data Reviewed: I have personally reviewed following labs and imaging studies  CBC: Recent Labs  Lab 06/03/20 0330 06/04/20 0213 06/05/20 0327 06/06/20 0116 06/07/20 0630  WBC 12.0* 11.4* 10.1 10.5 9.0  NEUTROABS 8.4* 7.9* 6.6 6.4 4.7  HGB 9.4* 9.8* 9.4* 9.0* 9.0*  HCT 29.1* 30.3* 27.5* 27.5* 28.1*  MCV 99.7 98.7 97.5 98.2 100.0  PLT 119* 123* 143* 145* 229   Basic Metabolic Panel: Recent Labs  Lab 06/03/20 0330 06/04/20 0213 06/05/20 0327 06/06/20 0116 06/07/20 0630  NA 142 142 143 143 142  K 4.2 4.0 3.9 3.8 4.2  CL 114* 114* 115* 113* 115*  CO2 19* 19* 19* 19* 18*  GLUCOSE 127* 103* 178* 121* 128*  BUN 36* 28* 26* 21 21  CREATININE 1.67* 1.18* 1.20* 1.22* 1.03*  CALCIUM 7.9* 8.4* 8.5* 8.6* 8.7*  MG 1.9 1.9 2.0 2.0 1.9  PHOS 2.6 3.0 3.3 3.6 4.2   GFR: Estimated Creatinine Clearance: 27.2 mL/min (A) (by C-G formula based on SCr of 1.03 mg/dL (H)). Liver Function Tests: Recent Labs  Lab 06/03/20 0330 06/04/20 0213 06/05/20 0327 06/06/20 0116 06/07/20 0630  AST 53* 43* 25 23 29   ALT 69* 56* 43 39 36  ALKPHOS 192* 200* 201* 181* 171*  BILITOT 0.9 0.8 0.7 0.6 0.5  PROT 4.9* 5.0* 5.0* 4.9* 5.0*  ALBUMIN 2.3* 2.3* 2.1* 2.0* 2.3*   No results for input(s): LIPASE, AMYLASE in the last 168 hours. No results for input(s): AMMONIA in the last 168 hours. Coagulation Profile: No results for input(s): INR, PROTIME in the last 168 hours. Cardiac Enzymes: No results for input(s): CKTOTAL, CKMB, CKMBINDEX, TROPONINI in the last 168 hours. BNP (last 3 results) No results for input(s): PROBNP in the last 8760 hours. HbA1C: No results for input(s): HGBA1C in the last 72  hours. CBG: Recent Labs  Lab 06/07/20 1202 06/07/20 1800 06/07/20 2105 06/08/20 0601 06/08/20 1126  GLUCAP 194* 171* 142* 85 158*   Lipid Profile: No results for input(s): CHOL, HDL, LDLCALC, TRIG, CHOLHDL, LDLDIRECT in the last 72 hours. Thyroid Function Tests: No results for input(s): TSH, T4TOTAL, FREET4, T3FREE, THYROIDAB in the last 72 hours. Anemia Panel: Recent Labs    06/06/20 0116  VITAMINB12 461  FOLATE 17.3  FERRITIN 327*  TIBC 174*  IRON 38  RETICCTPCT 2.0   Sepsis Labs: No results for input(s): PROCALCITON, LATICACIDVEN in the last 168 hours.  Recent Results (from the past 240 hour(s))  Resp Panel by RT-PCR (Flu A&B, Covid) Nasopharyngeal Swab     Status: None   Collection Time: 05/29/20  9:49 PM   Specimen: Nasopharyngeal Swab; Nasopharyngeal(NP) swabs in vial transport medium  Result Value  Ref Range Status   SARS Coronavirus 2 by RT PCR NEGATIVE NEGATIVE Final    Comment: (NOTE) SARS-CoV-2 target nucleic acids are NOT DETECTED.  The SARS-CoV-2 RNA is generally detectable in upper respiratory specimens during the acute phase of infection. The lowest concentration of SARS-CoV-2 viral copies this assay can detect is 138 copies/mL. A negative result does not preclude SARS-Cov-2 infection and should not be used as the sole basis for treatment or other patient management decisions. A negative result may occur with  improper specimen collection/handling, submission of specimen other than nasopharyngeal swab, presence of viral mutation(s) within the areas targeted by this assay, and inadequate number of viral copies(<138 copies/mL). A negative result must be combined with clinical observations, patient history, and epidemiological information. The expected result is Negative.  Fact Sheet for Patients:  EntrepreneurPulse.com.au  Fact Sheet for Healthcare Providers:  IncredibleEmployment.be  This test is no t yet  approved or cleared by the [place] FDA and  has been authorized for detection and/or diagnosis of SARS-CoV-2 by FDA under an Emergency Use Authorization (EUA). This EUA will remain  in effect (meaning this test can be used) for the duration of the COVID-19 declaration under Section 564(b)(1) of the Act, 21 U.S.C.section 360bbb-3(b)(1), unless the authorization is terminated  or revoked sooner.       Influenza A by PCR NEGATIVE NEGATIVE Final   Influenza B by PCR NEGATIVE NEGATIVE Final    Comment: (NOTE) The Xpert Xpress SARS-CoV-2/FLU/RSV plus assay is intended as an aid in the diagnosis of influenza from Nasopharyngeal swab specimens and should not be used as a sole basis for treatment. Nasal washings and aspirates are unacceptable for Xpert Xpress SARS-CoV-2/FLU/RSV testing.  Fact Sheet for Patients: EntrepreneurPulse.com.au  Fact Sheet for Healthcare Providers: IncredibleEmployment.be  This test is not yet approved or cleared by the [place] FDA and has been authorized for detection and/or diagnosis of SARS-CoV-2 by FDA under an Emergency Use Authorization (EUA). This EUA will remain in effect (meaning this test can be used) for the duration of the COVID-19 declaration under Section 564(b)(1) of the Act, 21 U.S.C. section 360bbb-3(b)(1), unless the authorization is terminated or revoked.  Performed at [facility], [address] [place], [place] [address]   Culture, blood (x 2)     Status: None   Collection Time: 05/30/20  5:00 PM   Specimen: BLOOD RIGHT ARM  Result Value Ref Range Status   Specimen Description BLOOD RIGHT ARM  Final   Special Requests   Final    BOTTLES DRAWN AEROBIC ONLY Blood Culture results may not be optimal due to an inadequate volume of blood received in culture bottles   Culture   Final    NO GROWTH 5 DAYS Performed at [facility], [place] [address], [place], [place] [address]     Report Status 06/04/2020 FINAL  Final  Culture, blood (x 2)     Status: None   Collection Time: 05/30/20  5:03 PM   Specimen: BLOOD RIGHT ARM  Result Value Ref Range Status   Specimen Description BLOOD RIGHT ARM  Final   Special Requests   Final    BOTTLES DRAWN AEROBIC ONLY Blood Culture results may not be optimal due to an inadequate volume of blood received in culture bottles   Culture   Final    NO GROWTH 5 DAYS Performed at [facility], [place] [address], [place], [place] [address]    Report Status 06/04/2020 FINAL  Final  Surgical pcr screen     Status: None   Collection Time: 05/31/20  8:24 PM   Specimen: Nasal Mucosa; Nasal Swab  Result Value Ref Range Status   MRSA, PCR NEGATIVE NEGATIVE Final   Staphylococcus aureus NEGATIVE NEGATIVE Final    Comment: (NOTE) The Xpert SA Assay (FDA approved for NASAL specimens in patients 28 years of age and older), is one component of a comprehensive surveillance program. It is not intended to diagnose infection nor to guide or monitor treatment. Performed at [facility], [place] [address], [place], [place] [address]       Radiology Studies: No results found.    Scheduled Meds: . aspirin EC  325 mg Oral Daily  . atorvastatin  40 mg Oral Daily  . carvedilol  12.5 mg Oral BID  . clopidogrel  75 mg Oral Daily  . feeding supplement  237 mL Oral Q1500  . heparin injection (subcutaneous)  5,000 Units Subcutaneous Q8H  . insulin aspart  0-5 Units Subcutaneous QHS  . insulin aspart  0-9 Units Subcutaneous TID WC  . insulin glargine  8 Units Subcutaneous QHS  . levETIRAcetam  500 mg Oral BID  . ramipril  5 mg Oral Daily  . sodium chloride flush  3 mL Intravenous Q12H   Continuous Infusions: . lactated ringers 10 mL/hr at 06/01/20 1243     LOS: 9 days      Time spent: 30 minutes   Dessa Phi, DO [facility] 06/08/2020, 12:54 PM   Available via Epic secure chat 7am-7pm After these hours, please refer to  coverage provider listed on [email or web address]

## 2020-06-09 DIAGNOSIS — K8020 Calculus of gallbladder without cholecystitis without obstruction: Secondary | ICD-10-CM | POA: Diagnosis not present

## 2020-06-09 LAB — GLUCOSE, CAPILLARY
Glucose-Capillary: 73 mg/dL (ref 70–99)
Glucose-Capillary: 125 mg/dL — ABNORMAL HIGH (ref 70–99)
Glucose-Capillary: 213 mg/dL — ABNORMAL HIGH (ref 70–99)
Glucose-Capillary: 174 mg/dL — ABNORMAL HIGH (ref 70–99)

## 2020-06-09 MED ORDER — TRAMADOL HCL 50 MG PO TABS: 50.0000 mg | ORAL_TABLET | Freq: Two times a day (BID) | ORAL | Status: DC | PRN

## 2020-06-09 NOTE — Progress Notes (Signed)
Inpatient Rehabilitation Admissions Coordinator  I met at bedside with patient and her son, [name]. I do not have a CIR bed for this patient today. I will follow up tomorrow.  Danne Baxter, RN, MSN Rehab Admissions Coordinator [phone] 06/09/2020 12:13 PM

## 2020-06-09 NOTE — Plan of Care (Signed)
  Problem: Activity: Goal: Risk for activity intolerance will decrease Outcome: Progressing   Problem: Nutrition: Goal: Adequate nutrition will be maintained Outcome: Progressing   Problem: Safety: Goal: Ability to remain free from injury will improve Outcome: Progressing   

## 2020-06-09 NOTE — Progress Notes (Addendum)
PROGRESS NOTE    Taylor Castaneda  WLN:[id number] DOB: 1933/04/23 DOA: 05/30/2020 PCP: [name], MD     Brief Narrative:  Taylor Castaneda a 87 y.o.femalewith medical history significant ofdiabetes mellitus type 2, hyperlipidemia, seizure disorder, historycolon cancers/presection,and anemia presented with complaints ofpassing out while at her condo. The patient's daughter found her on the floor and was able to help her get to a chair. However when EMS arrived patient had a witnessed generalized tonic-clonic seizure with urinary incontinence lasting approximately 1 minute prior to self resolving. They did not have to give any abortive medications. She had been compliant with her medications and last reported having a seizure over a year ago. At baseline patient lives alone and handles all of her own medications. Patient had not been feeling well earlier in the day with complaints of epigastric abdominal pain with radiation to her back. Denied any recent trauma prior to onset of symptoms. Symptoms lasted approximately 6 to 7 hours prior to self resolving. She had taken Tums and Tylenol prior to coming into the hospital. CT scan of the head and cervical spine did not note any acute abnormalities. CT imaging of the thoracic and lumbar spine noted severe spinal stenosis at L4-5andlarge nonobstructing left renal stone.Bowel sounds were noted to be stable. Labs at the outside facility yesterdayhadbeen significant for WBC 17.8, hemoglobin 11.4, BUN 37, creatinine 1.4, alkaline phosphatase 283, AST 315, ALT 235, total bilirubin 3.3 and lactic acid initially 4->2.6.COVID-19 and influenza screening was negative. Right upper quadrant ultrasound revealed cholelithiasis without evidence of acute cholecystitis and cirrhosis. Case had been discussed with GI at [facility] who recommended MRCP which revealed cholelithiasis without signs of acute cholecystitis, suspected small common bile  duct stones distally with no duct dilatation, and pancreatic atrophy with dilatation in the mid body tail region with abrupt transition point concerning for possible stricture. She had been given Keppra 1000 mg IV and 500 mL of normal saline IV fluids. Patient had been accepted to the hospitalist service to a medical telemetry bed. Repeat labs revealed WBC 25.6, hemoglobin 10.7,alkaline phosphatase 270, AST 219, ALT 207, and total bilirubin 3.7, lactic acid 2.2.  Patient underwent an ERCP and she had a periampullary diverticulum and she was noted to have choledocholithiasis which is status post biliary sphincterotomy and sphincteroplasty and balloon extraction.  She had cholelithiasis without cholecystitis and she had a mild gastritis.  Pancreatogram was not obtained and GI recommended watching for pancreatitis, bleeding or perforation and cholangitis.  Recommend a clear liquid diet and continue current medications and continue antibiotics for 5 days total and stopping the Flagyl.  He also recommended obtaining a surgical consultation while inpatient for questionable lap chole and following the results of the CA 19-9, CEA, IgG for.  Dr. Lyndel Safe feels that she will require an EUS as an outpatient for evaluation of pancreatic duct stricture.   General surgery was consulted on 05/31/2020 and after discussion with the patient they recommended a cholecystectomy which the patient was agreeable to.  Patient underwent a cholecystectomy on 06/01/2020 and is doing well postoperatively.  This morning she was slated to be discharged however started having some word finding difficulties and could not articulate her words properly.  Code stroke was called.  Neurology evaluated and symptoms had resolved but they felt she still needed further imaging.   MRI done and showed multiple punctate foci of acute ischemia within the right centrum semiovale with no hemorrhages or mass-effect seen.  Neurology recommended full stroke  work-up and she ended up going for an MRA of the head without contrast, echocardiogram as well as carotid dopplers.  Neurology felt that she had an acute CVA versus subacute CVA and recommended aspirin and Plavix 325 mg and 75 mg p.o. respectively for 3 months and then just Plavix alone given her severe intracranial stenosis.  PT OT now recommending CIR.  New events last 24 hours / Subjective: Patient without any complaints this morning.  Assessment & Plan:   Principal Problem:   Cholelithiasis Active Problems:   Hyperlipidemia associated with type 2 diabetes mellitus (HCC)   CKD (chronic kidney disease), stage III (HCC)   Hyperbilirubinemia   Normocytic anemia   Transaminitis   Pancreatic duct dilated   Common bile duct (CBD) obstruction   Cerebral thrombosis with cerebral infarction   Choledocholithiasis -Status post ERCP 05/31/2020, status post cholecystectomy 06/01/2020 -Patient to follow-up with Dr. Kae Heller as an outpatient  Acute CVA -Had episode of word finding difficulty on 12/30 -Neurology has recommended aspirin 325 mg daily, Plavix 75 mg daily for 3 months, then Plavix alone -Continue lipitor  -Follow-up with neurology Dr. Leta Baptist in 4 weeks  Pancreatic duct dilatation -Follow-up outpatient with [facility] Dr. Lyndel Safe for outpatient evaluation. CA 19-9 <2  Seizure disorder -Neurology has increased patient's medication to Keppra 500 mg twice daily -Follow-up with neurology Dr. Leta Baptist  Essential hypertension with now orthostatic hypotension -Remains on Coreg, ramipril.  Lasix remains on hold -Orthostatic vital sign 1/6 negative 152/55 --> 153/64   Paroxysmal atrial fibrillation -Not on anticoagulation due to fall risk -Follow-up with cardiology as outpatient  AKI on CKD stage IIIb -Resolved  Diabetes mellitus type 2, uncontrolled with hyperglycemia -Hemoglobin A1c 7.9 -Lantus, sliding scale insulin     DVT prophylaxis:  heparin injection 5,000 Units  Start: 06/05/20 1400 Place TED hose Start: 06/03/20 1732 Place TED hose Start: 06/03/20 1726 Place and maintain sequential compression device Start: 05/30/20 1629  Code Status: Full code Family Communication: At bedside Disposition Plan:  Status is: Inpatient  Remains inpatient appropriate because:Unsafe d/c plan   Dispo: The patient is from: Home              Anticipated d/c is to: CIR              Anticipated d/c date is: 1 day              Patient currently is medically stable to d/c.  Awaiting CIR bed availability     Antimicrobials:  Anti-infectives (From admission, onward)   Start     Dose/Rate Route Frequency Ordered Stop   05/30/20 1615  cefTRIAXone (ROCEPHIN) 2 g in sodium chloride 0.9 % 100 mL IVPB  Status:  Discontinued        2 g 200 mL/hr over 30 Minutes Intravenous Every 24 hours 05/30/20 1516 06/06/20 0825   05/30/20 1615  metroNIDAZOLE (FLAGYL) tablet 500 mg  Status:  Discontinued        500 mg Oral Every 8 hours 05/30/20 1516 05/31/20 1611       Objective: Vitals:   06/08/20 2350 06/09/20 0318 06/09/20 0751 06/09/20 1218  BP: (!) 147/75 139/88 (!) 196/68 (!) 149/64  Pulse: 66 72 71 72  Resp: 18 16 16 18   Temp: 97.9 F (36.6 C) 98 F (36.7 C) 98 F (36.7 C) 97.6 F (36.4 C)  TempSrc: Oral Oral Oral Oral  SpO2: 99% 100% 99% 100%  Weight:      Height:  Intake/Output Summary (Last 24 hours) at 06/09/2020 1242 Last data filed at 06/08/2020 2151 Gross per 24 hour  Intake 30 ml  Output --  Net 30 ml   Filed Weights   05/30/20 1645 05/31/20 0912 06/01/20 1045  Weight: 50.8 kg 50.8 kg 50.8 kg    Examination: General exam: Appears calm and comfortable  Respiratory system: Clear to auscultation. Respiratory effort normal. Cardiovascular system: S1 & S2 heard, RR. No pedal edema. Gastrointestinal system: Abdomen is nondistended, soft and nontender. Normal bowel sounds heard. Central nervous system: Alert and oriented. Extremities: Symmetric in  appearance bilaterally  Skin: No rashes, lesions or ulcers on exposed skin  Psychiatry: Judgement and insight appear stable. Mood & affect appropriate.    Data Reviewed: I have personally reviewed following labs and imaging studies  CBC: Recent Labs  Lab 06/03/20 0330 06/04/20 0213 06/05/20 0327 06/06/20 0116 06/07/20 0630  WBC 12.0* 11.4* 10.1 10.5 9.0  NEUTROABS 8.4* 7.9* 6.6 6.4 4.7  HGB 9.4* 9.8* 9.4* 9.0* 9.0*  HCT 29.1* 30.3* 27.5* 27.5* 28.1*  MCV 99.7 98.7 97.5 98.2 100.0  PLT 119* 123* 143* 145* 024   Basic Metabolic Panel: Recent Labs  Lab 06/03/20 0330 06/04/20 0213 06/05/20 0327 06/06/20 0116 06/07/20 0630  NA 142 142 143 143 142  K 4.2 4.0 3.9 3.8 4.2  CL 114* 114* 115* 113* 115*  CO2 19* 19* 19* 19* 18*  GLUCOSE 127* 103* 178* 121* 128*  BUN 36* 28* 26* 21 21  CREATININE 1.67* 1.18* 1.20* 1.22* 1.03*  CALCIUM 7.9* 8.4* 8.5* 8.6* 8.7*  MG 1.9 1.9 2.0 2.0 1.9  PHOS 2.6 3.0 3.3 3.6 4.2   GFR: Estimated Creatinine Clearance: 27.2 mL/min (A) (by C-G formula based on SCr of 1.03 mg/dL (H)). Liver Function Tests: Recent Labs  Lab 06/03/20 0330 06/04/20 0213 06/05/20 0327 06/06/20 0116 06/07/20 0630  AST 53* 43* 25 23 29   ALT 69* 56* 43 39 36  ALKPHOS 192* 200* 201* 181* 171*  BILITOT 0.9 0.8 0.7 0.6 0.5  PROT 4.9* 5.0* 5.0* 4.9* 5.0*  ALBUMIN 2.3* 2.3* 2.1* 2.0* 2.3*   No results for input(s): LIPASE, AMYLASE in the last 168 hours. No results for input(s): AMMONIA in the last 168 hours. Coagulation Profile: No results for input(s): INR, PROTIME in the last 168 hours. Cardiac Enzymes: No results for input(s): CKTOTAL, CKMB, CKMBINDEX, TROPONINI in the last 168 hours. BNP (last 3 results) No results for input(s): PROBNP in the last 8760 hours. HbA1C: No results for input(s): HGBA1C in the last 72 hours. CBG: Recent Labs  Lab 06/08/20 1126 06/08/20 1639 06/08/20 2133 06/09/20 0606 06/09/20 1219  GLUCAP 158* 229* 141* 73 125*   Lipid  Profile: No results for input(s): CHOL, HDL, LDLCALC, TRIG, CHOLHDL, LDLDIRECT in the last 72 hours. Thyroid Function Tests: No results for input(s): TSH, T4TOTAL, FREET4, T3FREE, THYROIDAB in the last 72 hours. Anemia Panel: No results for input(s): VITAMINB12, FOLATE, FERRITIN, TIBC, IRON, RETICCTPCT in the last 72 hours. Sepsis Labs: No results for input(s): PROCALCITON, LATICACIDVEN in the last 168 hours.  Recent Results (from the past 240 hour(s))  Culture, blood (x 2)     Status: None   Collection Time: 05/30/20  5:00 PM   Specimen: BLOOD RIGHT ARM  Result Value Ref Range Status   Specimen Description BLOOD RIGHT ARM  Final   Special Requests   Final    BOTTLES DRAWN AEROBIC ONLY Blood Culture results may not be optimal due to an inadequate  volume of blood received in culture bottles   Culture   Final    NO GROWTH 5 DAYS Performed at [facility], [place] [address], [place], [place] [address]    Report Status 06/04/2020 FINAL  Final  Culture, blood (x 2)     Status: None   Collection Time: 05/30/20  5:03 PM   Specimen: BLOOD RIGHT ARM  Result Value Ref Range Status   Specimen Description BLOOD RIGHT ARM  Final   Special Requests   Final    BOTTLES DRAWN AEROBIC ONLY Blood Culture results may not be optimal due to an inadequate volume of blood received in culture bottles   Culture   Final    NO GROWTH 5 DAYS Performed at [facility], [place] [address] [place], [place] [address]    Report Status 06/04/2020 FINAL  Final  Surgical pcr screen     Status: None   Collection Time: 05/31/20  8:24 PM   Specimen: Nasal Mucosa; Nasal Swab  Result Value Ref Range Status   MRSA, PCR NEGATIVE NEGATIVE Final   Staphylococcus aureus NEGATIVE NEGATIVE Final    Comment: (NOTE) The Xpert SA Assay (FDA approved for NASAL specimens in patients 61 years of age and older), is one component of a comprehensive surveillance program. It is not intended to diagnose infection nor to guide  or monitor treatment. Performed at [facility], [place] [address] [place], [place] [address]       Radiology Studies: No results found.    Scheduled Meds: . aspirin EC  325 mg Oral Daily  . atorvastatin  40 mg Oral Daily  . carvedilol  12.5 mg Oral BID  . clopidogrel  75 mg Oral Daily  . feeding supplement  237 mL Oral Q1500  . heparin injection (subcutaneous)  5,000 Units Subcutaneous Q8H  . insulin aspart  0-5 Units Subcutaneous QHS  . insulin aspart  0-9 Units Subcutaneous TID WC  . insulin glargine  8 Units Subcutaneous QHS  . levETIRAcetam  500 mg Oral BID  . ramipril  5 mg Oral Daily  . sodium chloride flush  3 mL Intravenous Q12H   Continuous Infusions: . lactated ringers 10 mL/hr at 06/01/20 1243     LOS: 10 days      Time spent: 15 minutes   Dessa Phi, DO [facility] 06/09/2020, 12:42 PM   Available via Epic secure chat 7am-7pm After these hours, please refer to coverage provider listed on [email or web address]

## 2020-06-09 NOTE — Progress Notes (Signed)
Physical Therapy Treatment Patient Details Name: Taylor Castaneda MRN: [id number] DOB: 11-25-1932 Today's Date: 06/09/2020    History of Present Illness 87 y.o. female presenting after syncopal episode followed by seizure. RUQ ultrasound (+) cholelithiasis w/o acute cholecystitis and cirrhosis s/p ERCP on 12/28 by Dr. Lyndel Safe. Patient also found to have AKI on CKD. CT head (-) for acute findings. Evidence of mild chronic small vessel ischemic disease and moderately advanced cerebral atrophy. MRI (+) intracranial stenosis and multiple new punctate foci of acute ischemia within the right centrum  semiovale. PMHx significant for DMII, HLD, seizures, and Hx colon CA s/p resection.    PT Comments    Pt making progress ambulating an increased distance of ~250 ft with RW and min guard-minA without LOB this date. However, she did require 1x standing rest break and assistance to manage RW when she would get it stuck on an obstacle. Pt demonstrates cognitive deficits that impact her awareness and thus safety with mobility. Cued pt to exaggerate "marching" on L leg with gait to facilitate improved foot clearance through increased hip flexion and ankle dorsiflexion, with success noted but pt performed marching bilaterally with gait. Pt appears to be compliant with HEP of seated marching and ankle dorsiflexion AROM to address weak muscles on L as the speed has decreased and quality has improved with each rep of the exercises. Will continue to follow acutely. Current recommendations remain appropriate.   Follow Up Recommendations  CIR     Equipment Recommendations  Rolling walker with 5" wheels    Recommendations for Other Services       Precautions / Restrictions Precautions Precautions: Fall Precaution Comments: monitor for vitals and neuro symptoms Restrictions Weight Bearing Restrictions: No    Mobility  Bed Mobility               General bed mobility comments: Pt sitting up in recliner upon  arrival.  Transfers Overall transfer level: Needs assistance Equipment used: Rolling walker (2 wheeled) Transfers: Sit to/from Stand Sit to Stand: Min assist         General transfer comment: MinA for steadying coming to stand, requiring extra time to power up to stand. Initially reaching for RW but required extra time to follow cues to push up from chair for safety purposes instead.  Ambulation/Gait Ambulation/Gait assistance: Min assist Gait Distance (Feet): 250 Feet (x1 standing rest break during) Assistive device: Rolling walker (2 wheeled) Gait Pattern/deviations: Step-through pattern;Decreased stride length;Trunk flexed;Decreased dorsiflexion - left Gait velocity: reduced Gait velocity interpretation: <1.8 ft/sec, indicate of risk for recurrent falls General Gait Details: Slow, unsteady gait cuing pt to remain within middle of RW, with success. Demonstartes poor L foot clearance with decreased dorisflexion and hip flexion with swing compared to R, cued to correct by exaggerating "marching" with gait, noted success but pt performed on R side also despite cues just to attempt on L only. MinA to direct pt away from obstacles when she would get R side of RW stuck.   Stairs             Wheelchair Mobility    Modified Rankin (Stroke Patients Only) Modified Rankin (Stroke Patients Only) Pre-Morbid Rankin Score: No significant disability Modified Rankin: Moderately severe disability     Balance Overall balance assessment: Needs assistance Sitting-balance support: Feet supported;No upper extremity supported Sitting balance-Leahy Scale: Fair Sitting balance - Comments: close supervision for safety   Standing balance support: During functional activity;Bilateral upper extremity supported Standing balance-Leahy Scale: Poor Standing  balance comment: Reliant on UE support for balance.                            Cognition Arousal/Alertness: Awake/alert Behavior  During Therapy: WFL for tasks assessed/performed Overall Cognitive Status: Impaired/Different from baseline Area of Impairment: Awareness;Problem solving;Safety/judgement;Attention;Memory;Following commands                   Current Attention Level: Focused Memory: Decreased recall of precautions;Decreased short-term memory Following Commands: Follows one step commands consistently;Follows multi-step commands inconsistently Safety/Judgement: Decreased awareness of safety;Decreased awareness of deficits Awareness: Emergent Problem Solving: Slow processing;Requires verbal cues General Comments: Slow processing of cues with poor ability to only perform cues on L leg instead of R. Pt would get RW stuck on obstacles occasionally with min correction, thus decreased safety awareness.      Exercises General Exercises - Lower Extremity Long Arc Quad: Strengthening;Seated;Left;10 reps Hip Flexion/Marching: Both;Strengthening;Seated;20 reps (improved quality on L) Toe Raises: Both;15 reps;Seated (AROM with 5 additional isometric 3' holds on L)    General Comments General comments (skin integrity, edema, etc.): HR 80-90s during gait bout      Pertinent Vitals/Pain Pain Assessment: No/denies pain    Home Living                      Prior Function            PT Goals (current goals can now be found in the care plan section) Acute Rehab PT Goals Patient Stated Goal: To return home. PT Goal Formulation: With patient/family Time For Goal Achievement: 06/16/20 Potential to Achieve Goals: Good Progress towards PT goals: Progressing toward goals    Frequency    Min 4X/week      PT Plan Current plan remains appropriate    Co-evaluation              AM-PAC PT "6 Clicks" Mobility   Outcome Measure  Help needed turning from your back to your side while in a flat bed without using bedrails?: A Little Help needed moving from lying on your back to sitting on the  side of a flat bed without using bedrails?: A Little Help needed moving to and from a bed to a chair (including a wheelchair)?: A Little Help needed standing up from a chair using your arms (e.g., wheelchair or bedside chair)?: A Little Help needed to walk in hospital room?: A Little Help needed climbing 3-5 steps with a railing? : A Lot 6 Click Score: 17    End of Session Equipment Utilized During Treatment: Gait belt Activity Tolerance: Patient tolerated treatment well Patient left: in chair;with call bell/phone within reach;with chair alarm set;with family/visitor present Nurse Communication: Mobility status PT Visit Diagnosis: Unsteadiness on feet (R26.81);Muscle weakness (generalized) (M62.81);Other symptoms and signs involving the nervous system (R29.898);Other abnormalities of gait and mobility (R26.89);Difficulty in walking, not elsewhere classified (R26.2)     Time: 7414-2395 PT Time Calculation (min) (ACUTE ONLY): 20 min  Charges:  $Gait Training: 8-22 mins                     Moishe Spice, PT, DPT Acute Rehabilitation Services  Pager: [phone] Office: [place] 06/09/2020, 5:58 PM

## 2020-06-10 ENCOUNTER — Inpatient Hospital Stay (HOSPITAL_COMMUNITY)
Admission: RE | Admit: 2020-06-10 | Discharge: 2020-06-24 | DRG: 057 | Disposition: A | Discharge status: Home Health | Payer: Medicare Other | Source: Intra-hospital | Attending: Physical Medicine & Rehabilitation | Admitting: Physical Medicine & Rehabilitation

## 2020-06-10 ENCOUNTER — Encounter (HOSPITAL_COMMUNITY): Payer: Self-pay | Admitting: Physical Medicine & Rehabilitation

## 2020-06-10 ENCOUNTER — Other Ambulatory Visit: Payer: Self-pay

## 2020-06-10 DIAGNOSIS — Z85038 Personal history of other malignant neoplasm of large intestine: Secondary | ICD-10-CM | POA: Diagnosis not present

## 2020-06-10 DIAGNOSIS — I679 Cerebrovascular disease, unspecified: Secondary | ICD-10-CM | POA: Diagnosis present

## 2020-06-10 DIAGNOSIS — K921 Melena: Secondary | ICD-10-CM

## 2020-06-10 DIAGNOSIS — J90 Pleural effusion, not elsewhere classified: Secondary | ICD-10-CM | POA: Diagnosis not present

## 2020-06-10 DIAGNOSIS — Z794 Long term (current) use of insulin: Secondary | ICD-10-CM | POA: Diagnosis not present

## 2020-06-10 DIAGNOSIS — N182 Chronic kidney disease, stage 2 (mild): Secondary | ICD-10-CM | POA: Diagnosis present

## 2020-06-10 DIAGNOSIS — L89151 Pressure ulcer of sacral region, stage 1: Secondary | ICD-10-CM | POA: Diagnosis present

## 2020-06-10 DIAGNOSIS — E11649 Type 2 diabetes mellitus with hypoglycemia without coma: Secondary | ICD-10-CM | POA: Diagnosis not present

## 2020-06-10 DIAGNOSIS — R569 Unspecified convulsions: Secondary | ICD-10-CM | POA: Diagnosis not present

## 2020-06-10 DIAGNOSIS — I6381 Other cerebral infarction due to occlusion or stenosis of small artery: Secondary | ICD-10-CM | POA: Diagnosis not present

## 2020-06-10 DIAGNOSIS — N179 Acute kidney failure, unspecified: Secondary | ICD-10-CM | POA: Diagnosis not present

## 2020-06-10 DIAGNOSIS — G40909 Epilepsy, unspecified, not intractable, without status epilepticus: Secondary | ICD-10-CM | POA: Diagnosis not present

## 2020-06-10 DIAGNOSIS — I10 Essential (primary) hypertension: Secondary | ICD-10-CM | POA: Diagnosis not present

## 2020-06-10 DIAGNOSIS — I69320 Aphasia following cerebral infarction: Principal | ICD-10-CM

## 2020-06-10 DIAGNOSIS — R0600 Dyspnea, unspecified: Secondary | ICD-10-CM | POA: Diagnosis not present

## 2020-06-10 DIAGNOSIS — I509 Heart failure, unspecified: Secondary | ICD-10-CM | POA: Diagnosis not present

## 2020-06-10 DIAGNOSIS — I129 Hypertensive chronic kidney disease with stage 1 through stage 4 chronic kidney disease, or unspecified chronic kidney disease: Secondary | ICD-10-CM | POA: Diagnosis present

## 2020-06-10 DIAGNOSIS — E1122 Type 2 diabetes mellitus with diabetic chronic kidney disease: Secondary | ICD-10-CM | POA: Diagnosis not present

## 2020-06-10 DIAGNOSIS — E785 Hyperlipidemia, unspecified: Secondary | ICD-10-CM | POA: Diagnosis present

## 2020-06-10 DIAGNOSIS — D500 Iron deficiency anemia secondary to blood loss (chronic): Secondary | ICD-10-CM | POA: Diagnosis present

## 2020-06-10 DIAGNOSIS — M25761 Osteophyte, right knee: Secondary | ICD-10-CM | POA: Diagnosis not present

## 2020-06-10 DIAGNOSIS — R29818 Other symptoms and signs involving the nervous system: Secondary | ICD-10-CM | POA: Diagnosis not present

## 2020-06-10 DIAGNOSIS — K922 Gastrointestinal hemorrhage, unspecified: Secondary | ICD-10-CM

## 2020-06-10 DIAGNOSIS — Z8673 Personal history of transient ischemic attack (TIA), and cerebral infarction without residual deficits: Secondary | ICD-10-CM | POA: Diagnosis present

## 2020-06-10 DIAGNOSIS — I639 Cerebral infarction, unspecified: Secondary | ICD-10-CM | POA: Diagnosis not present

## 2020-06-10 DIAGNOSIS — Z96652 Presence of left artificial knee joint: Secondary | ICD-10-CM | POA: Diagnosis present

## 2020-06-10 DIAGNOSIS — I6330 Cerebral infarction due to thrombosis of unspecified cerebral artery: Secondary | ICD-10-CM

## 2020-06-10 DIAGNOSIS — R32 Unspecified urinary incontinence: Secondary | ICD-10-CM | POA: Diagnosis present

## 2020-06-10 DIAGNOSIS — K7460 Unspecified cirrhosis of liver: Secondary | ICD-10-CM | POA: Diagnosis not present

## 2020-06-10 DIAGNOSIS — E119 Type 2 diabetes mellitus without complications: Secondary | ICD-10-CM

## 2020-06-10 DIAGNOSIS — M8580 Other specified disorders of bone density and structure, unspecified site: Secondary | ICD-10-CM | POA: Diagnosis present

## 2020-06-10 DIAGNOSIS — R0609 Other forms of dyspnea: Secondary | ICD-10-CM

## 2020-06-10 DIAGNOSIS — R4701 Aphasia: Secondary | ICD-10-CM | POA: Diagnosis not present

## 2020-06-10 DIAGNOSIS — Z87891 Personal history of nicotine dependence: Secondary | ICD-10-CM | POA: Diagnosis not present

## 2020-06-10 DIAGNOSIS — K8020 Calculus of gallbladder without cholecystitis without obstruction: Secondary | ICD-10-CM | POA: Diagnosis not present

## 2020-06-10 DIAGNOSIS — E876 Hypokalemia: Secondary | ICD-10-CM | POA: Diagnosis not present

## 2020-06-10 DIAGNOSIS — D62 Acute posthemorrhagic anemia: Secondary | ICD-10-CM | POA: Diagnosis not present

## 2020-06-10 DIAGNOSIS — R29704 NIHSS score 4: Secondary | ICD-10-CM | POA: Diagnosis not present

## 2020-06-10 DIAGNOSIS — L8990 Pressure ulcer of unspecified site, unspecified stage: Secondary | ICD-10-CM | POA: Insufficient documentation

## 2020-06-10 DIAGNOSIS — K8050 Calculus of bile duct without cholangitis or cholecystitis without obstruction: Secondary | ICD-10-CM | POA: Diagnosis not present

## 2020-06-10 DIAGNOSIS — N202 Calculus of kidney with calculus of ureter: Secondary | ICD-10-CM | POA: Diagnosis not present

## 2020-06-10 DIAGNOSIS — Z9049 Acquired absence of other specified parts of digestive tract: Secondary | ICD-10-CM | POA: Diagnosis not present

## 2020-06-10 DIAGNOSIS — Z79899 Other long term (current) drug therapy: Secondary | ICD-10-CM

## 2020-06-10 DIAGNOSIS — M25561 Pain in right knee: Secondary | ICD-10-CM

## 2020-06-10 DIAGNOSIS — R0602 Shortness of breath: Secondary | ICD-10-CM | POA: Diagnosis not present

## 2020-06-10 DIAGNOSIS — D649 Anemia, unspecified: Secondary | ICD-10-CM | POA: Diagnosis not present

## 2020-06-10 DIAGNOSIS — I6603 Occlusion and stenosis of bilateral middle cerebral arteries: Secondary | ICD-10-CM | POA: Diagnosis not present

## 2020-06-10 DIAGNOSIS — L89620 Pressure ulcer of left heel, unstageable: Secondary | ICD-10-CM | POA: Insufficient documentation

## 2020-06-10 DIAGNOSIS — M7989 Other specified soft tissue disorders: Secondary | ICD-10-CM | POA: Diagnosis not present

## 2020-06-10 DIAGNOSIS — I700 Atherosclerosis of aorta: Secondary | ICD-10-CM | POA: Diagnosis not present

## 2020-06-10 LAB — CBC
HCT: 27 % — ABNORMAL LOW (ref 36.0–46.0)
Hemoglobin: 8.6 g/dL — ABNORMAL LOW (ref 12.0–15.0)
MCH: 32.3 pg (ref 26.0–34.0)
MCHC: 31.9 g/dL (ref 30.0–36.0)
MCV: 101.5 fL — ABNORMAL HIGH (ref 80.0–100.0)
Platelets: 191 10*3/uL (ref 150–400)
RBC: 2.66 MIL/uL — ABNORMAL LOW (ref 3.87–5.11)
RDW: 14.7 % (ref 11.5–15.5)
WBC: 11.6 10*3/uL — ABNORMAL HIGH (ref 4.0–10.5)
nRBC: 0 % (ref 0.0–0.2)

## 2020-06-10 LAB — GLUCOSE, CAPILLARY
Glucose-Capillary: 158 mg/dL — ABNORMAL HIGH (ref 70–99)
Glucose-Capillary: 176 mg/dL — ABNORMAL HIGH (ref 70–99)
Glucose-Capillary: 174 mg/dL — ABNORMAL HIGH (ref 70–99)
Glucose-Capillary: 144 mg/dL — ABNORMAL HIGH (ref 70–99)

## 2020-06-10 LAB — CREATININE, SERUM
Creatinine, Ser: 1.07 mg/dL — ABNORMAL HIGH (ref 0.44–1.00)
GFR, Estimated: 51 mL/min — ABNORMAL LOW (ref >60)

## 2020-06-10 MED ORDER — ONDANSETRON HCL 4 MG PO TABS: 4.0000 mg | ORAL_TABLET | Freq: Four times a day (QID) | ORAL | Status: DC | PRN

## 2020-06-10 MED ORDER — INSULIN GLARGINE 100 UNIT/ML ~~LOC~~ SOLN
8.0000 [IU] | Freq: Every day | SUBCUTANEOUS | Status: DC
  Administered 2020-06-10 – 2020-06-11 (×2): 8 [IU] via SUBCUTANEOUS
  Filled 2020-06-10 (×3): qty 0.08

## 2020-06-10 MED ORDER — ASPIRIN EC 325 MG PO TBEC
325.0000 mg | DELAYED_RELEASE_TABLET | Freq: Every day | ORAL | Status: DC
  Administered 2020-06-11 – 2020-06-24 (×14): 325 mg via ORAL
  Filled 2020-06-10 (×14): qty 1

## 2020-06-10 MED ORDER — LANTUS SOLOSTAR 100 UNIT/ML ~~LOC~~ SOPN: 8.0000 [IU] | PEN_INJECTOR | Freq: Every day | SUBCUTANEOUS | 0 refills | Status: DC

## 2020-06-10 MED ORDER — HEPARIN SODIUM (PORCINE) 5000 UNIT/ML IJ SOLN: 5000.0000 [IU] | Freq: Three times a day (TID) | INTRAMUSCULAR | Status: DC

## 2020-06-10 MED ORDER — CARVEDILOL 12.5 MG PO TABS
12.5000 mg | ORAL_TABLET | Freq: Two times a day (BID) | ORAL | Status: DC
  Administered 2020-06-10 – 2020-06-24 (×28): 12.5 mg via ORAL
  Filled 2020-06-10 (×28): qty 1

## 2020-06-10 MED ORDER — ENSURE ENLIVE PO LIQD
237.0000 mL | Freq: Every day | ORAL | Status: DC
  Administered 2020-06-11 – 2020-06-22 (×9): 237 mL via ORAL
  Filled 2020-06-10: qty 237

## 2020-06-10 MED ORDER — CLOPIDOGREL BISULFATE 75 MG PO TABS
75.0000 mg | ORAL_TABLET | Freq: Every day | ORAL | Status: DC
  Administered 2020-06-11 – 2020-06-24 (×14): 75 mg via ORAL
  Filled 2020-06-10 (×14): qty 1

## 2020-06-10 MED ORDER — POLYVINYL ALCOHOL 1.4 % OP SOLN
1.0000 [drp] | Freq: Three times a day (TID) | OPHTHALMIC | Status: DC | PRN
  Filled 2020-06-10: qty 15

## 2020-06-10 MED ORDER — LEVETIRACETAM 500 MG PO TABS: 500.0000 mg | ORAL_TABLET | Freq: Two times a day (BID) | ORAL | Status: DC

## 2020-06-10 MED ORDER — HEPARIN SODIUM (PORCINE) 5000 UNIT/ML IJ SOLN
5000.0000 [IU] | Freq: Three times a day (TID) | INTRAMUSCULAR | Status: DC
  Administered 2020-06-10 – 2020-06-13 (×8): 5000 [IU] via SUBCUTANEOUS
  Filled 2020-06-10 (×8): qty 1

## 2020-06-10 MED ORDER — INSULIN ASPART 100 UNIT/ML ~~LOC~~ SOLN
0.0000 [IU] | Freq: Three times a day (TID) | SUBCUTANEOUS | Status: DC
  Administered 2020-06-10: 2 [IU] via SUBCUTANEOUS
  Administered 2020-06-11: 1 [IU] via SUBCUTANEOUS
  Administered 2020-06-11: 2 [IU] via SUBCUTANEOUS
  Administered 2020-06-12: 1 [IU] via SUBCUTANEOUS
  Administered 2020-06-12: 3 [IU] via SUBCUTANEOUS
  Administered 2020-06-13: 2 [IU] via SUBCUTANEOUS
  Administered 2020-06-13 – 2020-06-14 (×4): 1 [IU] via SUBCUTANEOUS
  Administered 2020-06-14 – 2020-06-15 (×2): 2 [IU] via SUBCUTANEOUS
  Administered 2020-06-16: 3 [IU] via SUBCUTANEOUS
  Administered 2020-06-16 – 2020-06-17 (×2): 1 [IU] via SUBCUTANEOUS
  Administered 2020-06-17: 2 [IU] via SUBCUTANEOUS
  Administered 2020-06-18: 5 [IU] via SUBCUTANEOUS
  Administered 2020-06-19: 1 [IU] via SUBCUTANEOUS
  Administered 2020-06-19: 2 [IU] via SUBCUTANEOUS
  Administered 2020-06-20: 1 [IU] via SUBCUTANEOUS
  Administered 2020-06-20: 2 [IU] via SUBCUTANEOUS
  Administered 2020-06-21: 1 [IU] via SUBCUTANEOUS
  Administered 2020-06-21: 3 [IU] via SUBCUTANEOUS
  Administered 2020-06-22 (×2): 2 [IU] via SUBCUTANEOUS
  Administered 2020-06-23: 1 [IU] via SUBCUTANEOUS
  Administered 2020-06-23: 7 [IU] via SUBCUTANEOUS
  Administered 2020-06-24: 1 [IU] via SUBCUTANEOUS

## 2020-06-10 MED ORDER — ACETAMINOPHEN 500 MG PO TABS
1000.0000 mg | ORAL_TABLET | Freq: Four times a day (QID) | ORAL | Status: DC | PRN
  Filled 2020-06-10: qty 2

## 2020-06-10 MED ORDER — ASPIRIN 325 MG PO TBEC: 325.0000 mg | DELAYED_RELEASE_TABLET | Freq: Every day | ORAL | 0 refills | Status: DC

## 2020-06-10 MED ORDER — CLOPIDOGREL BISULFATE 75 MG PO TABS: 75.0000 mg | ORAL_TABLET | Freq: Every day | ORAL | Status: DC

## 2020-06-10 MED ORDER — RAMIPRIL 5 MG PO CAPS
5.0000 mg | ORAL_CAPSULE | Freq: Every day | ORAL | Status: DC
  Administered 2020-06-11 – 2020-06-21 (×11): 5 mg via ORAL
  Filled 2020-06-10 (×12): qty 1

## 2020-06-10 MED ORDER — ALBUTEROL SULFATE (2.5 MG/3ML) 0.083% IN NEBU
2.5000 mg | INHALATION_SOLUTION | Freq: Four times a day (QID) | RESPIRATORY_TRACT | Status: DC | PRN
  Administered 2020-06-15 – 2020-06-21 (×3): 2.5 mg via RESPIRATORY_TRACT
  Filled 2020-06-10 (×3): qty 3

## 2020-06-10 MED ORDER — TRAMADOL HCL 50 MG PO TABS: 50.0000 mg | ORAL_TABLET | Freq: Two times a day (BID) | ORAL | Status: DC | PRN

## 2020-06-10 MED ORDER — ATORVASTATIN CALCIUM 40 MG PO TABS
40.0000 mg | ORAL_TABLET | Freq: Every day | ORAL | Status: DC
  Administered 2020-06-11 – 2020-06-24 (×14): 40 mg via ORAL
  Filled 2020-06-10 (×14): qty 1

## 2020-06-10 MED ORDER — ONDANSETRON HCL 4 MG/2ML IJ SOLN: 4.0000 mg | Freq: Four times a day (QID) | INTRAMUSCULAR | Status: DC | PRN

## 2020-06-10 MED ORDER — LEVETIRACETAM 500 MG PO TABS
500.0000 mg | ORAL_TABLET | Freq: Two times a day (BID) | ORAL | Status: DC
  Administered 2020-06-10 – 2020-06-22 (×24): 500 mg via ORAL
  Filled 2020-06-10 (×24): qty 1

## 2020-06-10 NOTE — Progress Notes (Signed)
Inpatient Rehabilitation Admissions Coordinator   CIR bed is available to admit her today. I met with bedside with patient and her son Gerald Stabs, spoke with daughter, [name], by phone and clarified d/c with Dr. Maylene Roes. Acute team and TOC made aware. I will make the arrangements to admit today.  Danne Baxter, RN, MSN Rehab Admissions Coordinator [phone] 06/10/2020 11:34 AM

## 2020-06-10 NOTE — H&P (Signed)
Physical Medicine and Rehabilitation Admission H&P     : HPI: Taylor Castaneda is a 87 year old right-handed female history of diabetes mellitus, hyperlipidemia, seizure disorder maintained on Keppra, history of colon cancer status post resection 1998 followed by Dr. Karie Chimera, glaucoma followed by Dr. Herbert Deaner and chronic anemia. Per chart review patient lives alone independent with assistive device. 1 level home 2 steps to entry. She does have good family support. Presented [facility] 05/30/2020 after being found down with complaints of abdominal pain right upper quadrant, epigastrium that radiated around to the ipsilateral scapula and shoulder. There was no nausea vomiting. There was no obvious trauma. There was some question of possible seizure. Cranial CT scan and CT cervical spine showed no evidence of acute abnormality. CT thoracic lumbar cervical spine again showing no acute osseous abnormality identified. There was severe stenosis at L4-5. Ultrasound the abdomen cholelithiasis without significant sonographic evidence of acute cholecystitis. MRI of the abdomen cholelithiasis but again no acute cholecystitis small common bile duct stones distally no common bile duct dilation. Significant pancreatic atrophy. Admission chemistries WBC 17,800, hemoglobin 11.4, glucose 205 BUN 37 creatinine 1.40, lactic acid 4.0, troponin 18, lipase 25, blood cultures no growth to date. Patient underwent ERCP 05/31/2020 per Dr. Lyndel Safe showing choledocholithiasis found status post biliary sphincterotomy, sphincteroplasty and balloon extraction, cholelithiasis without cholecystitis no pus was found pancreatogram was not obtained. General surgery consulted after ERCP completed underwent laparoscopic cholecystectomy 06/01/2020 per Dr. Windle Guard. Neurology consulted EEG negative for seizure. Echocardiogram ejection fraction of 60 to 65% no wall motion abnormalities grade 2 diastolic dysfunction. Patient with acute onset of aphasia  again neurology follow-up 06/02/2020 MRI showing multiple punctate foci of acute ischemia within the right centrum semiovale. No hemorrhage or mass-effect. MRA no large vessel occlusion identified. Patient was cleared to begin aspirin 325 mg and Plavix for CVA prophylaxis x3 months then Plavix alone. Subcutaneous heparin for DVT prophylaxis. Tolerating a regular consistency diet. Due to patient's decreased functional mobility aphasia she was admitted for a comprehensive rehab program.   Review of Systems  Constitutional: Negative for chills and fever.  HENT: Negative for hearing loss.   Eyes: Negative for blurred vision and double vision.  Respiratory: Negative for cough and shortness of breath.   Cardiovascular: Positive for leg swelling. Negative for chest pain and palpitations.  Gastrointestinal: Positive for abdominal pain. Negative for nausea and vomiting.  Genitourinary: Negative for dysuria, flank pain and hematuria.  Musculoskeletal: Positive for myalgias.  Skin: Negative for rash.  Neurological: Positive for speech change, seizures and weakness.  All other systems reviewed and are negative.       Past Medical History:  Diagnosis Date  . Anemia    . Diabetes mellitus, type II ([place])    . Glaucoma      Dr.Hecker  . History of colon cancer 1998    Dr. Lennie Hummer  . Hyperlipemia    . Osteopenia 02/2012, 03/2014    DEXA hip -2.2  . Renal insufficiency    . Seizures ([place])    . Stenosing tenosynovitis of finger of left hand 2016    index - s/p steroid injection x2         Past Surgical History:  Procedure Laterality Date  . ABI   11/2013    WNL  . APPENDECTOMY      . BILIARY DILATION   05/31/2020    Procedure: BILIARY DILATION;  Surgeon: Jackquline Denmark, MD;  Location: [facility] ENDOSCOPY;  Service: Endoscopy;;  .  BIOPSY   05/31/2020    Procedure: BIOPSY;  Surgeon: Jackquline Denmark, MD;  Location: [facility] ENDOSCOPY;  Service: Endoscopy;;  . CARPAL TUNNEL RELEASE        Bilateral   . CATARACT  EXTRACTION Bilateral 2006    Implants  . CHOLECYSTECTOMY N/A 06/01/2020    Procedure: LAPAROSCOPIC CHOLECYSTECTOMY;  Surgeon: Clovis Riley, MD;  Location: [place];  Service: General;  Laterality: N/A;  . COLECTOMY   1998    For cancer  . COLONOSCOPY   2008    Dr.Stark, Due 2011  . COLONOSCOPY   06/2013    1 hyperplastic polyp, ileocecal anastomosis [facility])  . dexa   02/2012, 03/2014    T score -2.2 at hip overall stable  . ENDOSCOPIC RETROGRADE CHOLANGIOPANCREATOGRAPHY (ERCP) WITH PROPOFOL N/A 05/31/2020    Procedure: ENDOSCOPIC RETROGRADE CHOLANGIOPANCREATOGRAPHY (ERCP) WITH PROPOFOL;  Surgeon: Jackquline Denmark, MD;  Location: [facility] ENDOSCOPY;  Service: Endoscopy;  Laterality: N/A;  . REFRACTIVE SURGERY      . REMOVAL OF STONES   05/31/2020    Procedure: REMOVAL OF STONES;  Surgeon: Jackquline Denmark, MD;  Location: [facility] ENDOSCOPY;  Service: Endoscopy;;  . [name]   05/31/2020    Procedure: [name];  Surgeon: Jackquline Denmark, MD;  Location: [facility] ENDOSCOPY;  Service: Endoscopy;;  . TONSILLECTOMY AND ADENOIDECTOMY      . TOTAL KNEE ARTHROPLASTY Left 12/17/2016    Procedure: LEFT TOTAL KNEE ARTHROPLASTY;  Surgeon: Gaynelle Arabian, MD;  Location: [facility] ORS;  Service: Orthopedics;  Laterality: Left;         Family History  Problem Relation Age of Onset  . Heart attack Father 50  . Stroke Father 66  . Diabetes Father    . Hypertension Father    . Heart disease Father          before age 60  . Breast cancer Mother    . Cancer Mother    . Diabetes Sister    . Cancer Sister    . Peripheral vascular disease Sister    . Breast cancer Sister    . Hypertension Son    . COPD Neg Hx    . Asthma Neg Hx    . Colon cancer Neg Hx    . Esophageal cancer Neg Hx    . Rectal cancer Neg Hx    . Stomach cancer Neg Hx      Social History:  reports that she quit smoking about 47 years ago. Her smoking use included cigarettes. She quit after 2.00 years of use. She has never used smokeless tobacco. She  reports previous alcohol use. She reports that she does not use drugs. Allergies:       Allergies  Allergen Reactions  . Gabapentin Other (See Comments)      Dizziness  . Pravastatin Sodium Other (See Comments)      "Anemia," per Dr Gilford Rile, [facility]          Medications Prior to Admission  Medication Sig Dispense Refill  . acetaminophen (TYLENOL) 325 MG tablet Take 325-650 mg by mouth every 6 (six) hours as needed (for pain).      Marland Kitchen aspirin 81 MG EC tablet Take 1 tablet (81 mg total) by mouth daily. Swallow whole. 30 tablet 0  . atorvastatin (LIPITOR) 40 MG tablet Take 1 tablet (40 mg total) by mouth daily at 6 PM. 90 tablet 3  . Biotin 10000 MCG TABS Take 10,000 mcg by mouth daily.      . Carboxymethylcellul-Glycerin 1-0.25 %  SOLN Place 1-2 drops into both eyes 3 (three) times daily as needed (for dry/irritated eyes.).      Marland Kitchen carvedilol (COREG) 12.5 MG tablet Take 1 tablet (12.5 mg total) by mouth 2 (two) times daily. 180 tablet 3  . Cholecalciferol (VITAMIN D3) 25 MCG (1000 UT) CAPS Take 1 capsule (1,000 Units total) by mouth daily. (Patient taking differently: Take 2,000 Units by mouth daily.) 30 capsule    . Cinnamon 500 MG capsule Take 1,000 mg by mouth daily.      . colchicine 0.6 MG tablet Take 1 tablet (0.6 mg total) by mouth daily as needed (gout flare). First day of gout flare, may take 1 tablet twice daily. (Patient taking differently: Take 0.6 mg by mouth daily as needed (for gout flares, and may take 0.6 mg TWICE DAILY on 1st day of flare).) 30 tablet 3  . Ferrous Sulfate (IRON) 325 (65 Fe) MG TABS Take 325 mg by mouth See admin instructions. Take 325 mg by mouth in the evening on Mondays and Thursdays only      . furosemide (LASIX) 20 MG tablet Take 1 tablet by mouth once daily (Patient taking differently: Take 20 mg by mouth in the morning.) 90 tablet 3  . HUMALOG KWIKPEN 100 UNIT/ML KiwkPen Inject 1-10 Units into the skin See admin instructions. Inject 1-10 units into the  skin three times a day with meals, PER SLIDING SCALE   4  . LANTUS SOLOSTAR 100 UNIT/ML Solostar Pen Inject 16 Units into the skin at bedtime.      . levETIRAcetam (KEPPRA) 250 MG tablet Take 1 tablet by mouth twice daily (Patient taking differently: Take 250 mg by mouth 2 (two) times daily.) 180 tablet 1  . Multiple Vitamins-Minerals (PRESERVISION AREDS 2 PO) Take 1 capsule by mouth 2 (two) times daily.      . potassium chloride (KLOR-CON) 10 MEQ tablet TAKE 1 TABLET BY MOUTH ON MONDAY, [facility] AND FRIDAY (Patient taking differently: Take 10 mEq by mouth every Monday, Wednesday, and Friday.) 36 tablet 3  . ramipril (ALTACE) 5 MG capsule Take 1 capsule by mouth once daily (Patient taking differently: Take 5 mg by mouth daily.) 90 capsule 3  . B-D ULTRAFINE III SHORT PEN 31G X 8 MM MISC USE AS DIRECTED WITH INSULIN 100 each 3      Drug Regimen Review Drug regimen was reviewed and remains appropriate with no significant issues identified   Home: Home Living Family/patient expects to be discharged to:: Other (Comment) (Apartment style condo 2nd floor with elevator access.) Living Arrangements: Alone Available Help at Discharge: Family,Available PRN/intermittently Type of Home: Other(Comment) (condo) Home Access: Scientist, research (physical sciences) of Steps: 2 Entrance Stairs-Rails: Can reach both Home Layout: One level Bathroom Shower/Tub: Multimedia programmer: Standard Bathroom Accessibility: Yes Home Equipment: Walker - 2 wheels,Cane - single point,Grab bars - tub/shower,Shower seat Additional Comments: has a great deal of equipment from caring for her husband  Lives With: Alone   Functional History: Prior Function Level of Independence: Independent,Independent with assistive device(s) Comments: Intermittent use of SPC in home. Use of RW in community dwellings.   Functional Status:  Mobility: Bed Mobility Overal bed mobility: Needs Assistance Bed Mobility: Supine to  Sit Supine to sit: HOB elevated,Min assist Sit to supine: Min assist General bed mobility comments: Patient seated in recliner upon entry. Transfers Overall transfer level: Needs assistance Equipment used: Rolling walker (2 wheeled) Transfers: Sit to/from Stand Sit to Stand: Min assist General transfer comment: Min  A with cues for hand placement. Ambulation/Gait Ambulation/Gait assistance: Min assist Gait Distance (Feet): 180 Feet Assistive device: Rolling walker (2 wheeled) Gait Pattern/deviations: Step-through pattern,Decreased stride length,Trunk flexed,Staggering left,Decreased dorsiflexion - left General Gait Details: Slow, unsteady gait with tendency to remain laterally to L side of RW despite cues to remain within middle of RW. Demonstartes poor L foot clearance with decreased dorisflexion compared to R, cued to correct with min success and extra concentration. 1 LOB posteriorly and laterally to L as pt fatigued, minA to recover. Gait velocity: reduced Gait velocity interpretation: <1.8 ft/sec, indicate of risk for recurrent falls   ADL: ADL Overall ADL's : Needs assistance/impaired Grooming: Minimal assistance,Standing Upper Body Dressing : Set up,Sitting Lower Body Dressing: Minimal assistance,Sit to/from stand Toilet Transfer: Minimal assistance,RW Toileting- Clothing Manipulation and Hygiene: Minimal assistance,Sit to/from stand Functional mobility during ADLs: Minimal assistance,Rolling walker General ADL Comments: Min A for safety with cues for walker management and proximity. Patient frequently bumping into furniture in room on R and L.   Cognition: Cognition Overall Cognitive Status: Impaired/Different from baseline Arousal/Alertness: Awake/alert Orientation Level: Oriented to person,Oriented to place,Oriented to situation Memory: Impaired (recalled 2 of 4 words after 5 minute delay; 4/4 with cue) Memory Impairment: Storage deficit Awareness: Appears  intact Problem Solving: Appears intact Safety/Judgment: Appears intact Cognition Arousal/Alertness: Awake/alert Behavior During Therapy: WFL for tasks assessed/performed Overall Cognitive Status: Impaired/Different from baseline Area of Impairment: Awareness,Problem solving,Safety/judgement,Orientation,Attention,Memory,Following commands Orientation Level: Disoriented to,Time (stated year was 1922 several times) Current Attention Level: Sustained Memory: Decreased recall of precautions,Decreased short-term memory Following Commands: Follows one step commands consistently,Follows one step commands with increased time Safety/Judgement: Decreased awareness of safety,Decreased awareness of deficits Awareness: Emergent Problem Solving: Slow processing,Requires verbal cues General Comments: Patient following 1-step verbal commands with increased accuracy than on initial eval. Continues to require increased time.   Physical Exam: Blood pressure (!) 179/71, pulse 68, temperature 98.1 F (36.7 C), temperature source Oral, resp. rate 18, height 4\' 11"  (1.499 m), weight 50.8 kg, SpO2 99 %. Physical Exam Constitutional:      Comments: Frail appearing  HENT:     Head: Normocephalic and atraumatic.     Right Ear: External ear normal.     Left Ear: External ear normal.     Nose: Nose normal. No congestion.     Mouth/Throat:     Mouth: Mucous membranes are moist.  Eyes:     Extraocular Movements: Extraocular movements intact.     Pupils: Pupils are equal, round, and reactive to light.  Cardiovascular:     Rate and Rhythm: Normal rate and regular rhythm.     Heart sounds: No murmur heard. No gallop.   Pulmonary:     Effort: Pulmonary effort is normal. No respiratory distress.     Breath sounds: No wheezing.  Abdominal:     General: Bowel sounds are normal. There is no distension.     Palpations: Abdomen is soft.     Tenderness: There is no abdominal tenderness. There is no guarding.      Comments: Cholecystectomy site clean and dry  Musculoskeletal:        General: No swelling or deformity. Normal range of motion.     Cervical back: Normal range of motion and neck supple.  Skin:    Comments: Large bleeding laceration right wrist with copious amount of kerlix on wrist to absorb.   Neurological:     Mental Status: She is alert.     Comments: Patient is alert  sitting up in chair. Makes eye contact with examiner. She does have some expressive aphasia but can speak in small phrases with extra time. Able to ID and say "watch". Read label on soda can with extra time and cues. Tried to say "stethescope". Comprehension seems intact. Able to tell me her home after self-correcting. Motor: 4+/5 RUE and 4/5 LUE. RLE 4/5 LLE 4- to 4/5. Senses pain and LT in all 4's.   Psychiatric:        Mood and Affect: Mood normal.        Behavior: Behavior normal.        Lab Results Last 48 Hours        Results for orders placed or performed during the hospital encounter of 05/30/20 (from the past 48 hour(s))  Glucose, capillary     Status: Abnormal    Collection Time: 06/06/20 11:49 AM  Result Value Ref Range    Glucose-Capillary 165 (H) 70 - 99 mg/dL      Comment: Glucose reference range applies only to samples taken after fasting for at least 8 hours.    Comment 1 Notify RN      Comment 2 Document in Chart    Glucose, capillary     Status: Abnormal    Collection Time: 06/06/20  4:12 PM  Result Value Ref Range    Glucose-Capillary 169 (H) 70 - 99 mg/dL      Comment: Glucose reference range applies only to samples taken after fasting for at least 8 hours.  Glucose, capillary     Status: Abnormal    Collection Time: 06/06/20  9:15 PM  Result Value Ref Range    Glucose-Capillary 155 (H) 70 - 99 mg/dL      Comment: Glucose reference range applies only to samples taken after fasting for at least 8 hours.    Comment 1 Notify RN      Comment 2 Document in Chart    Glucose, capillary     Status:  Abnormal    Collection Time: 06/07/20  6:01 AM  Result Value Ref Range    Glucose-Capillary 114 (H) 70 - 99 mg/dL      Comment: Glucose reference range applies only to samples taken after fasting for at least 8 hours.    Comment 1 Notify RN      Comment 2 Document in Chart    CBC with Differential/Platelet     Status: Abnormal    Collection Time: 06/07/20  6:30 AM  Result Value Ref Range    WBC 9.0 4.0 - 10.5 K/uL    RBC 2.81 (L) 3.87 - 5.11 MIL/uL    Hemoglobin 9.0 (L) 12.0 - 15.0 g/dL    HCT 28.1 (L) 36.0 - 46.0 %    MCV 100.0 80.0 - 100.0 fL    MCH 32.0 26.0 - 34.0 pg    MCHC 32.0 30.0 - 36.0 g/dL    RDW 13.9 11.5 - 15.5 %    Platelets 171 150 - 400 K/uL    nRBC 0.0 0.0 - 0.2 %    Neutrophils Relative % 52 %    Neutro Abs 4.7 1.7 - 7.7 K/uL    Lymphocytes Relative 28 %    Lymphs Abs 2.5 0.7 - 4.0 K/uL    Monocytes Relative 10 %    Monocytes Absolute 0.9 0.1 - 1.0 K/uL    Eosinophils Relative 6 %    Eosinophils Absolute 0.6 (H) 0.0 - 0.5 K/uL    Basophils Relative  0 %    Basophils Absolute 0.0 0.0 - 0.1 K/uL    Immature Granulocytes 4 %    Abs Immature Granulocytes 0.35 (H) 0.00 - 0.07 K/uL      Comment: Performed at [facility], [place] [address], [place], [place] [address]  Magnesium     Status: None    Collection Time: 06/07/20  6:30 AM  Result Value Ref Range    Magnesium 1.9 1.7 - 2.4 mg/dL      Comment: Performed at [facility] [address] [place], [place] [address]  Comprehensive metabolic panel     Status: Abnormal    Collection Time: 06/07/20  6:30 AM  Result Value Ref Range    Sodium 142 135 - 145 mmol/L    Potassium 4.2 3.5 - 5.1 mmol/L    Chloride 115 (H) 98 - 111 mmol/L    CO2 18 (L) 22 - 32 mmol/L    Glucose, Bld 128 (H) 70 - 99 mg/dL      Comment: Glucose reference range applies only to samples taken after fasting for at least 8 hours.    BUN 21 8 - 23 mg/dL    Creatinine, Ser 1.03 (H) 0.44 - 1.00 mg/dL    Calcium 8.7 (L) 8.9 - 10.3  mg/dL    Total Protein 5.0 (L) 6.5 - 8.1 g/dL    Albumin 2.3 (L) 3.5 - 5.0 g/dL    AST 29 15 - 41 U/L    ALT 36 0 - 44 U/L    Alkaline Phosphatase 171 (H) 38 - 126 U/L    Total Bilirubin 0.5 0.3 - 1.2 mg/dL    GFR, Estimated 53 (L) >60 mL/min      Comment: (NOTE) Calculated using the CKD-EPI Creatinine Equation (2021)      Anion gap 9 5 - 15      Comment: Performed at [facility], [place] [address], [place], [place] [address]  Phosphorus     Status: None    Collection Time: 06/07/20  6:30 AM  Result Value Ref Range    Phosphorus 4.2 2.5 - 4.6 mg/dL      Comment: Performed at [facility] [address], [place], [place] [address]  Glucose, capillary     Status: Abnormal    Collection Time: 06/07/20 12:02 PM  Result Value Ref Range    Glucose-Capillary 194 (H) 70 - 99 mg/dL      Comment: Glucose reference range applies only to samples taken after fasting for at least 8 hours.  Glucose, capillary     Status: Abnormal    Collection Time: 06/07/20  6:00 PM  Result Value Ref Range    Glucose-Capillary 171 (H) 70 - 99 mg/dL      Comment: Glucose reference range applies only to samples taken after fasting for at least 8 hours.  Glucose, capillary     Status: Abnormal    Collection Time: 06/07/20  9:05 PM  Result Value Ref Range    Glucose-Capillary 142 (H) 70 - 99 mg/dL      Comment: Glucose reference range applies only to samples taken after fasting for at least 8 hours.    Comment 1 Notify RN      Comment 2 Document in Chart    Glucose, capillary     Status: None    Collection Time: 06/08/20  6:01 AM  Result Value Ref Range    Glucose-Capillary 85 70 - 99 mg/dL  Comment: Glucose reference range applies only to samples taken after fasting for at least 8 hours.    Comment 1 Notify RN      Comment 2 Document in Chart        Imaging Results (Last 48 hours)  No results found.           Medical Problem List and Plan: 1. Decreased functional ability with aphasia  secondary to several punctate right centrum semiovale infarct likely due to severe intracranial stenosis             -patient may  Shower with right wrist covered.             -ELOS/Goals: 7-10 days, Mod I with PT, OT and min assist with SLP 2.  Antithrombotics: -DVT/anticoagulation: Subcutaneous heparin             -antiplatelet therapy: Aspirin 325 mg daily and Plavix 75 mg daily x3 months then Plavix alone 3. Pain Management: Tramadol as needed 4. Mood: Provide emotional support             -antipsychotic agents: N/A 5. Neuropsych: This patient is capable of making decisions on her own behalf. 6. Skin/Wound Care: Routine skin checks 7. Fluids/Electrolytes/Nutrition: Routine in and outs with follow-up chemistries 8. Cholelithiasis/choledocholithiasis. Status post ERCP followed by laparoscopic cholecystectomy 06/01/2020             -pt denies any pain at present 9. Seizure disorder. Keppra 500 mg twice daily. EEG negative 10. Hypertension. Altace 5 mg daily, Coreg 12.5 mg twice daily.              -Monitor with increased mobility 11. Diabetes mellitus. Hemoglobin A1c 8.7. Lantus insulin 8 units nightly.              -Check blood sugars before meals and at bedtime 12. Hyperlipidemia. Lipitor 13. History of colon cancer with resection 1998. Follow-up outpatient     Cathlyn Parsons, PA-C 06/08/2020  I have personally performed a face to face diagnostic evaluation of this patient and formulated the key components of the plan.  Additionally, I have personally reviewed laboratory data, imaging studies, as well as relevant notes and concur with the physician assistant's documentation above.  The patient's status has not changed from the original H&P.  Any changes in documentation from the acute care chart have been noted above.  Meredith Staggers, MD, Mellody Drown

## 2020-06-10 NOTE — Progress Notes (Signed)
Occupational Therapy Treatment Patient Details Name: Taylor Castaneda MRN: [id number] DOB: 1932-10-05 Today's Date: 06/10/2020    History of present illness 87 y.o. female presenting after syncopal episode followed by seizure. RUQ ultrasound (+) cholelithiasis w/o acute cholecystitis and cirrhosis s/p ERCP on 12/28 by Dr. Lyndel Safe. Patient also found to have AKI on CKD. CT head (-) for acute findings. Evidence of mild chronic small vessel ischemic disease and moderately advanced cerebral atrophy. MRI (+) intracranial stenosis and multiple new punctate foci of acute ischemia within the right centrum  semiovale. PMHx significant for DMII, HLD, seizures, and Hx colon CA s/p resection.   OT comments  OT treatment session with focus on self-care re-education, ADL transfers, functional cognition, and short-distance functional mobility in hospital room with RW. Patient with increased attention to task requiring fewer verbal cues but continues to demonstrate decreased balance, decreased safety awareness, decreased memory, and generalized weakness indicating need for continued acute OT services. Continued recommendation for CIR given patient's CLOF and deficits.    Follow Up Recommendations  CIR    Equipment Recommendations  None recommended by OT    Recommendations for Other Services      Precautions / Restrictions Precautions Precautions: Fall Restrictions Weight Bearing Restrictions: No       Mobility Bed Mobility Overal bed mobility: Needs Assistance             General bed mobility comments: Patient seated in recliner upon entry  Transfers Overall transfer level: Needs assistance Equipment used: Rolling walker (2 wheeled) Transfers: Sit to/from Stand Sit to Stand: Min guard;Min assist         General transfer comment: Min guard from elevated surfaces and Min A from low surfaces.    Balance Overall balance assessment: Needs assistance Sitting-balance support: Feet supported;No  upper extremity supported Sitting balance-Leahy Scale: Fair     Standing balance support: During functional activity;Bilateral upper extremity supported Standing balance-Leahy Scale: Fair Standing balance comment: Patient able to maintain standing balance during clothing management without UE support on RW.No overt LOB.                           ADL either performed or assessed with clinical judgement   ADL       Grooming: Min guard;Standing           Upper Body Dressing : Set up;Sitting       Toilet Transfer: Min guard;RW   Toileting- Clothing Manipulation and Hygiene: Minimal assistance;Sit to/from stand       Functional mobility during ADLs: Minimal assistance;Rolling walker General ADL Comments: Min A for safety with cues for walker management and proximity. Increased attention to environment this date.     Vision       Perception     Praxis      Cognition Arousal/Alertness: Awake/alert Behavior During Therapy: WFL for tasks assessed/performed Overall Cognitive Status: Impaired/Different from baseline Area of Impairment: Awareness;Problem solving;Safety/judgement;Attention;Memory;Following commands                   Current Attention Level: Focused Memory: Decreased short-term memory Following Commands: Follows multi-step commands with increased time;Follows one step commands consistently Safety/Judgement: Decreased awareness of safety;Decreased awareness of deficits   Problem Solving: Slow processing;Requires verbal cues General Comments: Improved cognition. Continues to require cues for safety. Poor STM.        Exercises     Shoulder Instructions       General Comments Son  present at bedside.    Pertinent Vitals/ Pain       Pain Assessment: No/denies pain  Home Living                                          Prior Functioning/Environment              Frequency  Min 2X/week        Progress  Toward Goals  OT Goals(current goals can now be found in the care plan section)  Progress towards OT goals: Progressing toward goals  Acute Rehab OT Goals Patient Stated Goal: To return home. OT Goal Formulation: With patient Time For Goal Achievement: 06/17/20 Potential to Achieve Goals: Good ADL Goals Pt Will Perform Upper Body Dressing: sitting;with set-up Pt Will Perform Lower Body Dressing: with supervision;sit to/from stand Pt Will Transfer to Toilet: with supervision;ambulating;grab bars Pt Will Perform Toileting - Clothing Manipulation and hygiene: with supervision;sit to/from stand Additional ADL Goal #1: Patient will be A&Ox4 demonstrating increased cognitive orientation.  Plan Discharge plan remains appropriate;Frequency remains appropriate    Co-evaluation                 AM-PAC OT "6 Clicks" Daily Activity     Outcome Measure   Help from another person eating meals?: None Help from another person taking care of personal grooming?: A Little Help from another person toileting, which includes using toliet, bedpan, or urinal?: A Little Help from another person bathing (including washing, rinsing, drying)?: A Little Help from another person to put on and taking off regular upper body clothing?: A Little Help from another person to put on and taking off regular lower body clothing?: A Little 6 Click Score: 19    End of Session Equipment Utilized During Treatment: Gait belt;Rolling walker  OT Visit Diagnosis: Unsteadiness on feet (R26.81);Muscle weakness (generalized) (M62.81);History of falling (Z91.81);Other symptoms and signs involving cognitive function   Activity Tolerance Patient tolerated treatment well   Patient Left in chair;with call bell/phone within reach;with chair alarm set   Nurse Communication          Time: 0623-7628 OT Time Calculation (min): 19 min  Charges: OT General Charges $OT Visit: 1 Visit OT Treatments $Self Care/Home  Management : 8-22 mins  Kristeena Meineke H. OTR/L Supplemental OT, Department of rehab services [phone]   Keishawna Carranza R H. 06/10/2020, 12:05 PM

## 2020-06-10 NOTE — Plan of Care (Signed)
Nurse received report from RN. Pt arrived to unit, in no distress. Alert and oriented, breathing even and unlabored, no SOB noted. Son at bedside. Pt and son oriented to unit. No issues at this time.

## 2020-06-10 NOTE — Progress Notes (Signed)
[facility] PROGRESS NOTE   Subjective/Complaints: Had a good night. Daughter at bedside. Had an early session with OT.   ROS: Patient denies fever, rash, sore throat, blurred vision, nausea, vomiting, diarrhea, cough, shortness of breath or chest pain, joint or back pain, headache, or mood change.    Objective:   No results found. Recent Labs    06/10/20 1655  WBC 11.6*  HGB 8.6*  HCT 27.0*  PLT 191   Recent Labs    06/10/20 1655  CREATININE 1.07*    Intake/Output Summary (Last 24 hours) at 06/10/2020 1925 Last data filed at 06/10/2020 1825 Gross per 24 hour  Intake 120 ml  Output --  Net 120 ml        Physical Exam: Vital Signs Temperature (!) 97.5 F (36.4 C), temperature source Oral, height 5' (1.524 m), weight 56.5 kg.  General: Alert and oriented x 3, No apparent distress HEENT: Head is normocephalic, atraumatic, PERRLA, EOMI, sclera anicteric, oral mucosa pink and moist, dentition intact, ext ear canals clear,  Neck: Supple without JVD or lymphadenopathy Heart: Reg rate and rhythm. No murmurs rubs or gallops Chest: CTA bilaterally without wheezes, rales, or rhonchi; no distress Abdomen: Soft, non-tender, non-distended, bowel sounds positive. Extremities: No clubbing, cyanosis, or edema. Pulses are 2+ Skin: right wrist wound with vaseline gauze and kerlix--drainage decreased Neuro: oriented x3, ongoing word finding deficits but language much more fluent and spontaneous today then even yesterday. Reasonable insight and awareness. RUE 4+/5 RLE 4+/5. LUE and LLE 4/5. No focal sensory deficits appreciated Musculoskeletal: Full ROM, No pain with AROM or PROM in the neck, trunk, or extremities. Posture appropriate Psych: Pt's affect is appropriate. Pt is cooperative     Assessment/Plan: 1. Functional deficits which require 3+ hours per day of interdisciplinary therapy in a comprehensive inpatient rehab setting.  Physiatrist is  providing close team supervision and 24 hour management of active medical problems listed below.  Physiatrist and rehab team continue to assess barriers to discharge/monitor patient progress toward functional and medical goals  Care Tool:  Bathing              Bathing assist       Upper Body Dressing/Undressing Upper body dressing        Upper body assist      Lower Body Dressing/Undressing Lower body dressing            Lower body assist       Toileting Toileting    Toileting assist Assist for toileting: Contact Guard/Touching assist     Transfers Chair/bed transfer  Transfers assist     Chair/bed transfer assist level: Contact Guard/Touching assist     Locomotion Ambulation   Ambulation assist              Walk 10 feet activity   Assist           Walk 50 feet activity   Assist           Walk 150 feet activity   Assist           Walk 10 feet on uneven surface  activity   Assist           Wheelchair     Assist               Wheelchair 50 feet with 2 turns activity    Assist            Wheelchair 150  feet activity     Assist          Temperature (!) 97.5 F (36.4 C), temperature source Oral, height 5' (1.524 m), weight 56.5 kg.  Medical Problem List and Plan: 1.Decreased functional ability with aphasiasecondary to several punctate right centrum semiovale infarct likely due to severe intracranial stenosis -patient may Shower with right wrist covered. -ELOS/Goals: 7-10 days, Mod I with PT, OT and min assist with SLP  -beginning therapies today 2. Antithrombotics: -DVT/anticoagulation:Subcutaneous heparin -antiplatelet therapy: Aspirin 325 mg daily and Plavix 75 mg daily x3 months then Plavix alone 3. Pain Management:Tramadol as needed 4. Mood:Provide emotional support -antipsychotic agents: N/A 5. Neuropsych: This  patientiscapable of making decisions on herown behalf. 6. Skin/Wound Care:Routine skin checks 7. Fluids/Electrolytes/Nutrition:Routine in and outs with follow-up chemistries 8. Cholelithiasis/choledocholithiasis. Status post ERCP followed by laparoscopic cholecystectomy 06/01/2020 -pt denies any pain at present 9. Seizure disorder. Keppra 500 mg twice daily. EEG negative 10. Hypertension. Altace 5 mg daily, Coreg 12.5 mg twice daily. -SBP borderline. Avoid hypoperfusion---no changes today 11. Diabetes mellitus. Hemoglobin A1c 8.7. Lantus insulin 8 units nightly. -cbg's under fair control thus far. Continue to monitor 12. Hyperlipidemia. Lipitor 13. History of colon cancer with resection 1998. Follow-up as outpatient    LOS: 0 days A FACE TO FACE EVALUATION WAS PERFORMED  Meredith Staggers 06/10/2020, 7:25 PM

## 2020-06-10 NOTE — Discharge Summary (Signed)
Physician Discharge Summary  Taylor Castaneda QQV:[id number] DOB: 24-Jun-1932 DOA: 05/30/2020  PCP: Taylor Bush, MD  Admit date: 05/30/2020 Discharge date: 06/10/2020  Admitted From: Home Disposition:  CIR   Recommendations for Outpatient Follow-up:  1. Follow up with PCP in 1 week 2. Follow-up with general surgery Dr. Kae Heller 1/18  3. Follow up with neurology Dr. Leta Baptist in 4 weeks 4. Follow-up with [facility] Dr. Lyndel Safe 5. Follow-up with cardiology  Discharge Condition: Stable CODE STATUS: Full code Diet recommendation: Heart healthy/carb modified  Brief/Interim Summary: Taylor B Suttonis a 87 y.o.femalewith medical history significant ofdiabetes mellitus type 2, hyperlipidemia, seizure disorder, historycolon cancers/presection,and anemia presented with complaints ofpassing out while at her condo. The patient's daughter found her on the floor and was able to help her get to a chair. However when EMS arrived patient had a witnessed generalized tonic-clonic seizure with urinary incontinence lasting approximately 1 minute prior to self resolving. They did not have to give any abortive medications. She had been compliant with her medications and last reported having a seizure over a year ago. At baseline patient lives alone and handles all of her own medications. Patient had not been feeling well earlier in the day with complaints of epigastric abdominal pain with radiation to her back. Denied any recent trauma prior to onset of symptoms. Symptoms lasted approximately 6 to 7 hours prior to self resolving. She had taken Tums and Tylenol prior to coming into the hospital. CT scan of the head and cervical spine did not note any acute abnormalities. CT imaging of the thoracic and lumbar spine noted severe spinal stenosis at L4-5andlarge nonobstructing left renal stone.Bowel sounds were noted to be stable. Labs at the outside facility yesterdayhadbeen significant for WBC 17.8,  hemoglobin 11.4, BUN 37, creatinine 1.4, alkaline phosphatase 283, AST 315, ALT 235, total bilirubin 3.3 and lactic acid initially 4->2.6.COVID-19 and influenza screening was negative. Right upper quadrant ultrasound revealed cholelithiasis without evidence of acute cholecystitis and cirrhosis. Case had been discussed with GI at [facility] who recommended MRCP which revealed cholelithiasis without signs of acute cholecystitis, suspected small common bile duct stones distally with no duct dilatation, and pancreatic atrophy with dilatation in the mid body tail region with abrupt transition point concerning for possible stricture. She had been given Keppra 1000 mg IV and 500 mL of normal saline IV fluids. Patient had been accepted to the hospitalist service to a medical telemetry bed. Repeat labs revealed WBC 25.6, hemoglobin 10.7,alkaline phosphatase 270, AST 219, ALT 207, and total bilirubin 3.7, lactic acid 2.2.  Patient underwent an ERCP and she had a periampullary diverticulum and she was noted to have choledocholithiasis which is status post biliary sphincterotomy and sphincteroplasty and balloon extraction. She had cholelithiasis without cholecystitis and she had a mild gastritis. Pancreatogram was not obtained and GI recommended watching for pancreatitis, bleeding or perforation and cholangitis. Recommend a clear liquid diet and continue current medications and continue antibiotics for 5 days total and stopping the Flagyl. He also recommended obtaining a surgical consultation while inpatient for questionable lap chole and following the results of the CA 19-9, CEA, IgG for. Dr. Lyndel Safe feels that she will require an EUS as an outpatient for evaluation of pancreatic duct stricture.   General surgery was consulted on 05/31/2020 and after discussion with the patient they recommended a cholecystectomy which the patient was agreeable to. Patient underwent a cholecystectomy on 06/01/2020 and is doing  well postoperatively. This morning she was slated to be discharged however  started having some word finding difficulties and could not articulate her words properly. Code stroke was called. Neurology evaluated and symptoms had resolved but they felt she still needed further imaging.   MRI done and showed multiple punctate foci of acute ischemia within the right centrum semiovale with no hemorrhages or mass-effect seen. Neurology recommended full stroke work-up and she ended up going for an MRA of the head without contrast, echocardiogram as well as carotid dopplers. Neurology felt that she had an acute CVA versus subacute CVA and recommended aspirin and Plavix 325 mg and 75 mg p.o. respectively for 3 months and then just Plavix alone given her severe intracranial stenosis. PT OT now recommending CIR.  Discharge Diagnoses:  Principal Problem:   Cholelithiasis Active Problems:   Hyperlipidemia associated with type 2 diabetes mellitus (HCC)   CKD (chronic kidney disease), stage III (HCC)   Hyperbilirubinemia   Normocytic anemia   Transaminitis   Pancreatic duct dilated   Common bile duct (CBD) obstruction   Cerebral thrombosis with cerebral infarction   Choledocholithiasis -Status post ERCP 05/31/2020, status post cholecystectomy 06/01/2020 -Patient to follow-up with Dr. Kae Heller as an outpatient  Acute CVA -Had episode of word finding difficulty on 12/30 -Neurology has recommended aspirin 325 mg daily, Plavix 75 mg daily for 3 months, then Plavix alone -Continue lipitor  -Follow-up with neurology Dr. Leta Baptist in 4 weeks  Pancreatic duct dilatation -Follow-up outpatient with [facility] Dr. Lyndel Safe for outpatient evaluation. CA 19-9 <2  Seizure disorder -Neurology has increased patient's medication to Keppra 500 mg twice daily -Follow-up with neurology Dr. Leta Baptist  Essential hypertension with now orthostatic hypotension -Continue Coreg, ramipril, lasix  -Orthostatic vital  sign 1/6 negative 152/55 --> 153/64   Paroxysmal atrial fibrillation -Not on anticoagulation due to fall risk -Follow-up with cardiology as outpatient  AKI on CKD stage IIIb -Resolved  Diabetes mellitus type 2, uncontrolled with hyperglycemia -Hemoglobin A1c 7.9 -Lantus, sliding scale insulin   Discharge Instructions  Discharge Instructions    Ambulatory referral to Neurology   Complete by: As directed    Follow up with Dr. Leta Baptist at [facility] in 4 weeks.  Patient is Dr. Gladstone Lighter patient. Thanks.   Increase activity slowly   Complete by: As directed    No wound care   Complete by: As directed      Allergies as of 06/10/2020      Reactions   Gabapentin Other (See Comments)   Dizziness   Pravastatin Sodium Other (See Comments)   "Anemia," per Dr Gilford Rile, [facility]      Medication List    STOP taking these medications   furosemide 20 MG tablet Commonly known as: LASIX     TAKE these medications   acetaminophen 325 MG tablet Commonly known as: TYLENOL Take 325-650 mg by mouth every 6 (six) hours as needed (for pain).   aspirin 325 MG EC tablet Take 1 tablet (325 mg total) by mouth daily. Start taking on: June 11, 2020 What changed:   medication strength  how much to take  additional instructions   atorvastatin 40 MG tablet Commonly known as: LIPITOR Take 1 tablet (40 mg total) by mouth daily at 6 PM.   B-D ULTRAFINE III SHORT PEN 31G X 8 MM Misc Generic drug: Insulin Pen Needle USE AS DIRECTED WITH INSULIN   Biotin 10000 MCG Tabs Take 10,000 mcg by mouth daily.   Carboxymethylcellul-Glycerin 1-0.25 % Soln Place 1-2 drops into both eyes 3 (three) times daily as needed (for dry/irritated  eyes.).   carvedilol 12.5 MG tablet Commonly known as: COREG Take 1 tablet (12.5 mg total) by mouth 2 (two) times daily.   Cinnamon 500 MG capsule Take 1,000 mg by mouth daily.   clopidogrel 75 MG tablet Commonly known as: PLAVIX Take 1 tablet (75 mg total)  by mouth daily. Start taking on: June 11, 2020   colchicine 0.6 MG tablet Take 1 tablet (0.6 mg total) by mouth daily as needed (gout flare). First day of gout flare, may take 1 tablet twice daily. What changed:   reasons to take this  additional instructions   HumaLOG KwikPen 100 UNIT/ML KwikPen Generic drug: insulin lispro Inject 1-10 Units into the skin See admin instructions. Inject 1-10 units into the skin three times a day with meals, PER SLIDING SCALE   Iron 325 (65 Fe) MG Tabs Take 325 mg by mouth See admin instructions. Take 325 mg by mouth in the evening on Mondays and Thursdays only   Lantus SoloStar 100 UNIT/ML Solostar Pen Generic drug: insulin glargine Inject 8 Units into the skin at bedtime. What changed: how much to take   levETIRAcetam 500 MG tablet Commonly known as: KEPPRA Take 1 tablet (500 mg total) by mouth 2 (two) times daily. What changed:   medication strength  how much to take   potassium chloride 10 MEQ tablet Commonly known as: KLOR-CON TAKE 1 TABLET BY MOUTH ON MONDAY, WEDNESDAY AND FRIDAY What changed:   how much to take  how to take this  when to take this  additional instructions   PRESERVISION AREDS 2 PO Take 1 capsule by mouth 2 (two) times daily.   ramipril 5 MG capsule Commonly known as: ALTACE Take 1 capsule by mouth once daily   Vitamin D3 25 MCG (1000 UT) Caps Take 1 capsule (1,000 Units total) by mouth daily. What changed: how much to take       [facility] Go on 06/21/2020.   Specialty: General Surgery Why: Your appointment is 01/18 at 11 am Please arrive 30 minutes prior to your appointment to check in and fill out paperwork. Bring photo ID and insurance information. Contact information: [address] [place] [phone]       Penni Bombard, MD. Schedule an appointment as soon as possible for a visit in 4  week(s).   Specialties: Neurology, Radiology Contact information: [address] [place] [address] [phone]        Taylor Bush, MD. Schedule an appointment as soon as possible for a visit in 1 week(s).   Specialty: Family Medicine Contact information: [place] [address] [phone]        Jackquline Denmark, MD Follow up.   Specialties: Gastroenterology, Internal Medicine Contact information: [address] [place] [address] [phone]        Lorretta Harp, MD Follow up.   Specialties: Cardiology, Radiology Contact information: [address] [place] [address] [place] [address] [phone]              Allergies  Allergen Reactions  . Gabapentin Other (See Comments)    Dizziness  . Pravastatin Sodium Other (See Comments)    "Anemia," per Dr Gilford Rile, [facility]    Procedures/Studies: CT Head Wo Contrast  Result Date: 05/29/2020 CLINICAL DATA:  Seizures.  Found on ground. EXAM: CT HEAD WITHOUT CONTRAST CT CERVICAL SPINE WITHOUT CONTRAST TECHNIQUE:  Multidetector CT imaging of the head and cervical spine was performed following the standard protocol without intravenous contrast. Multiplanar CT image reconstructions of the cervical spine were also generated. COMPARISON:  Head CT 03/05/2019, head and neck CTA 03/05/2019, and head MRI 03/06/2019 FINDINGS: CT HEAD FINDINGS Brain: There is no evidence of an acute infarct, intracranial hemorrhage, mass, midline shift, or extra-axial fluid collection. Hypodensities in the cerebral white matter bilaterally are unchanged and nonspecific but compatible with mild chronic small vessel ischemic disease. A chronic lacunar infarct is again noted in the right basal ganglia. Moderately advanced cerebral atrophy is unchanged. Vascular: Calcified atherosclerosis at the skull base. No hyperdense vessel. Skull: No fracture or suspicious osseous lesion.  Sinuses/Orbits: Visualized paranasal sinuses and mastoid air cells are clear. Bilateral cataract extraction. Other: None. CT CERVICAL SPINE FINDINGS Alignment: Cervical spine straightening. Trace anterolisthesis of C4 on C5. Skull base and vertebrae: No acute fracture or suspicious osseous lesion. Moderate median C1-2 arthropathy. Soft tissues and spinal canal: No prevertebral fluid or swelling. No visible canal hematoma. Disc levels: Severe disc space narrowing at C5-6 and C6-7 with uncovertebral spurring resulting in only mild bilateral neural foraminal stenosis. Mild cervical facet arthrosis, most notable on the left at C4-5. Upper chest: Clear lung apices. Other: Mild carotid atherosclerosis. IMPRESSION: 1. No evidence of acute intracranial abnormality. 2. Mild chronic small vessel ischemic disease and moderately advanced cerebral atrophy. 3. No acute cervical spine fracture. Electronically Signed   By: Logan Bores M.D.   On: 05/29/2020 20:13   CT Cervical Spine Wo Contrast  Result Date: 05/29/2020 CLINICAL DATA:  Seizures.  Found on ground. EXAM: CT HEAD WITHOUT CONTRAST CT CERVICAL SPINE WITHOUT CONTRAST TECHNIQUE: Multidetector CT imaging of the head and cervical spine was performed following the standard protocol without intravenous contrast. Multiplanar CT image reconstructions of the cervical spine were also generated. COMPARISON:  Head CT 03/05/2019, head and neck CTA 03/05/2019, and head MRI 03/06/2019 FINDINGS: CT HEAD FINDINGS Brain: There is no evidence of an acute infarct, intracranial hemorrhage, mass, midline shift, or extra-axial fluid collection. Hypodensities in the cerebral white matter bilaterally are unchanged and nonspecific but compatible with mild chronic small vessel ischemic disease. A chronic lacunar infarct is again noted in the right basal ganglia. Moderately advanced cerebral atrophy is unchanged. Vascular: Calcified atherosclerosis at the skull base. No hyperdense vessel.  Skull: No fracture or suspicious osseous lesion. Sinuses/Orbits: Visualized paranasal sinuses and mastoid air cells are clear. Bilateral cataract extraction. Other: None. CT CERVICAL SPINE FINDINGS Alignment: Cervical spine straightening. Trace anterolisthesis of C4 on C5. Skull base and vertebrae: No acute fracture or suspicious osseous lesion. Moderate median C1-2 arthropathy. Soft tissues and spinal canal: No prevertebral fluid or swelling. No visible canal hematoma. Disc levels: Severe disc space narrowing at C5-6 and C6-7 with uncovertebral spurring resulting in only mild bilateral neural foraminal stenosis. Mild cervical facet arthrosis, most notable on the left at C4-5. Upper chest: Clear lung apices. Other: Mild carotid atherosclerosis. IMPRESSION: 1. No evidence of acute intracranial abnormality. 2. Mild chronic small vessel ischemic disease and moderately advanced cerebral atrophy. 3. No acute cervical spine fracture. Electronically Signed   By: Logan Bores M.D.   On: 05/29/2020 20:13   CT Thoracic Spine Wo Contrast  Result Date: 05/29/2020 CLINICAL DATA:  Seizures.  Found on ground. EXAM: CT THORACIC AND LUMBAR SPINE WITHOUT CONTRAST TECHNIQUE: Multidetector CT imaging of the thoracic and lumbar spine was performed without contrast. Multiplanar CT image reconstructions were also generated. COMPARISON:  CT abdomen and pelvis 06/13/2009 FINDINGS: CT THORACIC SPINE FINDINGS Alignment: Mild thoracic dextroscoliosis.  No listhesis. Vertebrae: No acute fracture or suspicious osseous lesion. Paraspinal and other soft tissues: Aortic and coronary atherosclerosis. Disc levels: Mild-to-moderate multilevel disc degeneration and moderate facet arthrosis in the thoracic spine. No evidence of high-grade spinal stenosis. Moderate left neural foraminal stenosis at T11-12 due to prominent facet spurring. CT LUMBAR SPINE FINDINGS Segmentation: 5 lumbar type vertebrae. Alignment: Mild lumbar levoscoliosis.  No  listhesis. Vertebrae: No acute fracture or suspicious osseous lesion. Paraspinal and other soft tissues: Nonobstructing 1.5 cm left renal stone, much larger than in 2011. Partially visualized left renal cyst measuring at least 3.8 cm. Abdominal aortic atherosclerosis without aneurysm. Disc levels: Mild disc space narrowing at L2-3 with severe narrowing at L4-5. Disc bulging and posterior element hypertrophy result in severe spinal stenosis at L4-5 and mild spinal stenosis at L3-4 greater than L2-3. There is moderate multilevel neural foraminal stenosis. IMPRESSION: 1. No acute osseous abnormality identified in the thoracic or lumbar spine. 2. Thoracic and lumbar disc and facet degeneration as above. Severe spinal stenosis at L4-5. 3. Large nonobstructing left renal stone. 4. Aortic Atherosclerosis (ICD10-I70.0). Electronically Signed   By: Logan Bores M.D.   On: 05/29/2020 20:53   CT Lumbar Spine Wo Contrast  Result Date: 05/29/2020 CLINICAL DATA:  Seizures.  Found on ground. EXAM: CT THORACIC AND LUMBAR SPINE WITHOUT CONTRAST TECHNIQUE: Multidetector CT imaging of the thoracic and lumbar spine was performed without contrast. Multiplanar CT image reconstructions were also generated. COMPARISON:  CT abdomen and pelvis 06/13/2009 FINDINGS: CT THORACIC SPINE FINDINGS Alignment: Mild thoracic dextroscoliosis.  No listhesis. Vertebrae: No acute fracture or suspicious osseous lesion. Paraspinal and other soft tissues: Aortic and coronary atherosclerosis. Disc levels: Mild-to-moderate multilevel disc degeneration and moderate facet arthrosis in the thoracic spine. No evidence of high-grade spinal stenosis. Moderate left neural foraminal stenosis at T11-12 due to prominent facet spurring. CT LUMBAR SPINE FINDINGS Segmentation: 5 lumbar type vertebrae. Alignment: Mild lumbar levoscoliosis.  No listhesis. Vertebrae: No acute fracture or suspicious osseous lesion. Paraspinal and other soft tissues: Nonobstructing 1.5 cm  left renal stone, much larger than in 2011. Partially visualized left renal cyst measuring at least 3.8 cm. Abdominal aortic atherosclerosis without aneurysm. Disc levels: Mild disc space narrowing at L2-3 with severe narrowing at L4-5. Disc bulging and posterior element hypertrophy result in severe spinal stenosis at L4-5 and mild spinal stenosis at L3-4 greater than L2-3. There is moderate multilevel neural foraminal stenosis. IMPRESSION: 1. No acute osseous abnormality identified in the thoracic or lumbar spine. 2. Thoracic and lumbar disc and facet degeneration as above. Severe spinal stenosis at L4-5. 3. Large nonobstructing left renal stone. 4. Aortic Atherosclerosis (ICD10-I70.0). Electronically Signed   By: Logan Bores M.D.   On: 05/29/2020 20:53   MR ANGIO HEAD WO CONTRAST  Result Date: 06/03/2020 CLINICAL DATA:  87 year old female with transient aphasia and punctate foci of acute ischemia in the right hemisphere white matter on MRI this morning. EXAM: MRA HEAD WITHOUT CONTRAST TECHNIQUE: Angiographic images of the Circle of Willis were obtained using MRA technique without intravenous contrast. COMPARISON:  Brain MRI 0414 hours today. CTA head and neck 03/05/2019. FINDINGS: And no intracranial mass effect or ventriculomegaly. Antegrade flow in the posterior circulation with codominant patent distal vertebral arteries. No distal vertebral or vertebrobasilar junction stenosis. Patent right PICA origin, left AICA which may be dominant. Patent basilar artery although diminutive distally with mild to moderate mid basilar stenosis  on series 1055, image 10. The basilar artery functionally terminates in SCAs. Fetal type bilateral PCA origins. Both SCA is are patent. Bilateral PCAs are patent, with mild irregularity on the left and mild to moderate right PCA P2 irregularity and stenosis. Antegrade flow in both ICA siphons. On the right there is mild siphon irregularity and stenosis. But there is severe left  siphon supraclinoid stenosis as demonstrated on series 1045, image 16. Posterior communicating artery origins are normal. Patent bilateral carotid termini. Patent MCA and ACA origins. Diminutive or absent anterior communicating artery. Bilateral visible ACA branches are within normal limits. Left MCA M1 segment bifurcates early without stenosis but there is severe stenosis at the origin of a superior left MCA M2 branch on series 1045 image 13. Other left MCA branches are within normal limits. Right MCA M1 is patent but there is severe stenosis at the right MCA bifurcation also affecting the proximal superior M2 branch, as well as moderate stenosis at the origin of the inferior M2 branch (series 1045, image 10). No complete occlusion of a right MCA branch identified. IMPRESSION: 1. Advanced intracranial atherosclerosis. No large vessel occlusion identified, but: - Severe stenosis at the Right MCA bifurcation, with long segment involvement of the superior M2. - Severe stenosis distal Left ICA siphon and Left MCA superior M2 branch origin. 2. Moderate stenosis Right PCA P2 segment. Electronically Signed   By: Genevie Ann M.D.   On: 06/03/2020 11:33   MR BRAIN WO CONTRAST  Result Date: 06/03/2020 CLINICAL DATA:  Transient expressive aphasia EXAM: MRI HEAD WITHOUT CONTRAST TECHNIQUE: Multiplanar, multiecho pulse sequences of the brain and surrounding structures were obtained without intravenous contrast. COMPARISON:  03/06/2019 FINDINGS: Brain: There are multiple punctate foci of abnormal diffusion restriction within the right centrum semiovale. No contralateral diffusion abnormality. No acute or chronic hemorrhage. There is multifocal hyperintense T2-weighted signal within the white matter. Generalized volume loss without a clear lobar predilection. The midline structures are normal. Vascular: Major flow voids are preserved. Skull and upper cervical spine: Normal calvarium and skull base. Visualized upper cervical  spine and soft tissues are normal. Sinuses/Orbits:No paranasal sinus fluid levels or advanced mucosal thickening. No mastoid or middle ear effusion. Normal orbits. IMPRESSION: Multiple punctate foci of acute ischemia within the right centrum semiovale. No hemorrhage or mass effect. Electronically Signed   By: Ulyses Jarred M.D.   On: 06/03/2020 04:50   MR 3D Recon At Scanner  Result Date: 05/30/2020 CLINICAL DATA:  Right upper quadrant abdominal pain. Cholelithiasis. EXAM: MRI ABDOMEN WITHOUT AND WITH CONTRAST (INCLUDING MRCP) TECHNIQUE: Multiplanar multisequence MR imaging of the abdomen was performed both before and after the administration of intravenous contrast. Heavily T2-weighted images of the biliary and pancreatic ducts were obtained, and three-dimensional MRCP images were rendered by post processing. CONTRAST:  7.73mL GADAVIST GADOBUTROL 1 MMOL/ML IV SOLN COMPARISON:  Ultrasound examination, same date. FINDINGS: Examination is limited due to respiratory motion. Lower chest: The lung bases are clear of an acute process. No pleural effusions or pericardial effusion. Hepatobiliary: No hepatic lesions are identified. No intrahepatic biliary dilatation. Layering dependent gallstones are noted in the gallbladder but no MR findings suspicious for acute cholecystitis. No common bile duct dilatation. Suspect small common bile duct stones distally. Low insertion of the cystic. Pancreas: Significant pancreatic atrophy. This is most notable in the distal body/tail regions there is virtually no pancreatic tissue. Pancreatic duct is abruptly dilated at the midbody level no pancreatic mass is identified. This could be due to  a ductal stricture. Spleen:  Normal size. No focal lesions. Adrenals/Urinary Tract:  The adrenal glands are unremarkable. There are simple renal cysts and a 10 mm left renal calculus. No worrisome renal lesions or hydronephrosis. Stomach/Bowel: The stomach, duodenum, visualized small bowel and  visualized colon are grossly normal. Vascular/Lymphatic: Advanced atherosclerotic calcifications involving the aorta but aneurysm dissection. The branch vessels are patent. The major venous structures are patent. Other: No ascites or abdominal hernia. No obvious free air or free fluid. Musculoskeletal: No significant bony findings. IMPRESSION: 1. Examination is limited due to respiratory motion. 2. Cholelithiasis but no MR findings for acute cholecystitis. 3. Suspect small common bile duct stones distally. No common bile duct dilatation. 4. Significant pancreatic atrophy. There is also pancreatic duct dilatation in the mid body tail region with an abrupt transition but no obvious obstructing lesion. Possible pancreatic stricture. CT abdomen with pancreatic protocol may be helpful for further evaluation. 5. Simple renal cysts and a 10 mm left renal calculus. Electronically Signed   By: Marijo Sanes M.D.   On: 05/30/2020 05:13   DG ERCP BILIARY & PANCREATIC DUCTS  Result Date: 05/31/2020 CLINICAL DATA:  ERCP. EXAM: ERCP TECHNIQUE: Multiple spot images obtained with the fluoroscopic device and submitted for interpretation post-procedure. COMPARISON:  MRCP-05/30/2020 FLUOROSCOPY TIME:  2 minutes, 33 seconds (22.1 mGy) FINDINGS: Twelve spot intraoperative fluoroscopic images of the right upper abdominal quadrant during ERCP are provided for review Initial image demonstrates an ERCP probe overlying the right upper abdominal quadrant Subsequent images demonstrate selective cannulation and opacification of the CBD which appears moderately dilated. There is a lenticular for filling defect within the distal aspect of the CBD likely compatible with questioned choledocholithiasis on preceding MRCP. Subsequent images demonstrate plasty at the level of the biliary ampulla with subsequent biliary sweeping and sphincterotomy. There is minimal opacification the cystic duct and gallbladder lumen. There is minimal opacification  of the intrahepatic biliary tree which appears nondilated. There is no definitive opacification of the pancreatic duct. IMPRESSION: ERCP with findings of choledocholithiasis, biliary plasty, sweeping and presumed sphincterotomy. These images were submitted for radiologic interpretation only. Please see the procedural report for the amount of contrast and the fluoroscopy time utilized. Electronically Signed   By: Sandi Mariscal M.D.   On: 05/31/2020 13:09   EEG adult  Result Date: 06/02/2020 Lora Havens, MD     06/02/2020 11:09 PM Patient Name: KAICEE SCARPINO MRN: [id number] Epilepsy Attending: Lora Havens Referring Physician/Provider: Dr Carlisle Cater Date: 06/02/2020 Duration: 23.50 mins Patient history:  87yo F with acute transient episodes of expressive aphasia today. EEG to evaluate for siezure Level of alertness: Awake, asleep AEDs during EEG study: LEV Technical aspects: This EEG study was done with scalp electrodes positioned according to the 10-20 International system of electrode placement. Electrical activity was acquired at a sampling rate of 500Hz  and reviewed with a high frequency filter of 70Hz  and a low frequency filter of 1Hz . EEG data were recorded continuously and digitally stored. Description: The posterior dominant rhythm consists of 7.5 Hz activity of moderate voltage (25-35 uV) seen predominantly in posterior head regions, symmetric and reactive to eye opening and eye closing. Sleep was characterized by vertex waves, sleep spindles (12 to 14 Hz), maximal frontocentral region.  EEG showed intermittent right>left temporal 2-3Hz  delta slowing.  Hyperventilation and photic stimulation were not performed.   ABNORMALITY -Intermittent slow, right>left temporal region IMPRESSION: This study is suggestive of cortical dysfunction in right more than left temporal  region, non specific etiology. No seizures or epileptiform discharges were seen throughout the recording. Lora Havens    MR ABDOMEN MRCP W WO CONTAST  Result Date: 05/30/2020 CLINICAL DATA:  Right upper quadrant abdominal pain. Cholelithiasis. EXAM: MRI ABDOMEN WITHOUT AND WITH CONTRAST (INCLUDING MRCP) TECHNIQUE: Multiplanar multisequence MR imaging of the abdomen was performed both before and after the administration of intravenous contrast. Heavily T2-weighted images of the biliary and pancreatic ducts were obtained, and three-dimensional MRCP images were rendered by post processing. CONTRAST:  7.58mL GADAVIST GADOBUTROL 1 MMOL/ML IV SOLN COMPARISON:  Ultrasound examination, same date. FINDINGS: Examination is limited due to respiratory motion. Lower chest: The lung bases are clear of an acute process. No pleural effusions or pericardial effusion. Hepatobiliary: No hepatic lesions are identified. No intrahepatic biliary dilatation. Layering dependent gallstones are noted in the gallbladder but no MR findings suspicious for acute cholecystitis. No common bile duct dilatation. Suspect small common bile duct stones distally. Low insertion of the cystic. Pancreas: Significant pancreatic atrophy. This is most notable in the distal body/tail regions there is virtually no pancreatic tissue. Pancreatic duct is abruptly dilated at the midbody level no pancreatic mass is identified. This could be due to a ductal stricture. Spleen:  Normal size. No focal lesions. Adrenals/Urinary Tract:  The adrenal glands are unremarkable. There are simple renal cysts and a 10 mm left renal calculus. No worrisome renal lesions or hydronephrosis. Stomach/Bowel: The stomach, duodenum, visualized small bowel and visualized colon are grossly normal. Vascular/Lymphatic: Advanced atherosclerotic calcifications involving the aorta but aneurysm dissection. The branch vessels are patent. The major venous structures are patent. Other: No ascites or abdominal hernia. No obvious free air or free fluid. Musculoskeletal: No significant bony findings. IMPRESSION: 1.  Examination is limited due to respiratory motion. 2. Cholelithiasis but no MR findings for acute cholecystitis. 3. Suspect small common bile duct stones distally. No common bile duct dilatation. 4. Significant pancreatic atrophy. There is also pancreatic duct dilatation in the mid body tail region with an abrupt transition but no obvious obstructing lesion. Possible pancreatic stricture. CT abdomen with pancreatic protocol may be helpful for further evaluation. 5. Simple renal cysts and a 10 mm left renal calculus. Electronically Signed   By: Marijo Sanes M.D.   On: 05/30/2020 05:13   ECHOCARDIOGRAM COMPLETE  Result Date: 06/03/2020    ECHOCARDIOGRAM REPORT   Patient Name:   KRISTYL ATHENS Date of Exam: 06/03/2020 Medical Rec #:  [id number]      Height:       59.0 in Accession #:    [id number]     Weight:       112.0 lb Date of Birth:  20-Aug-1932      BSA:          1.442 m Patient Age:    48 years       BP:           157/67 mmHg Patient Gender: F              HR:           61 bpm. Exam Location:  Inpatient Procedure: 2D Echo, Cardiac Doppler and Color Doppler Indications:    Stroke  History:        Patient has prior history of Echocardiogram examinations, most                 recent 03/06/2019. Abnormal ECG, Stroke, Arrythmias:Atrial  Fibrillation, Signs/Symptoms:Dyspnea; Risk Factors:Hypertension                 and Diabetes.  Sonographer:    Roseanna Rainbow RDCS Referring Phys: [id number] [place] [facility]  Sonographer Comments: Technically difficult study due to poor echo windows. Image acquisition challenging due to patient body habitus. IMPRESSIONS  1. Left ventricular ejection fraction, by estimation, is 60 to 65%. The left ventricle has normal function. The left ventricle has no regional wall motion abnormalities. Left ventricular diastolic parameters are consistent with Grade II diastolic dysfunction (pseudonormalization).  2. Right ventricular systolic function is mildly reduced. The right  ventricular size is normal. There is severely elevated pulmonary artery systolic pressure. The estimated right ventricular systolic pressure is 99.8 mmHg.  3. The mitral valve is normal in structure. Mild mitral valve regurgitation. No evidence of mitral stenosis. Moderate mitral annular calcification.  4. The aortic valve is tricuspid. Aortic valve regurgitation is not visualized. Mild aortic valve sclerosis is present, with no evidence of aortic valve stenosis.  5. The inferior vena cava is normal in size with <50% respiratory variability, suggesting right atrial pressure of 8 mmHg.  6. There is a pleural effusion. FINDINGS  Left Ventricle: Left ventricular ejection fraction, by estimation, is 60 to 65%. The left ventricle has normal function. The left ventricle has no regional wall motion abnormalities. The left ventricular internal cavity size was normal in size. There is  no left ventricular hypertrophy. Left ventricular diastolic parameters are consistent with Grade II diastolic dysfunction (pseudonormalization). Right Ventricle: The right ventricular size is normal. No increase in right ventricular wall thickness. Right ventricular systolic function is mildly reduced. There is severely elevated pulmonary artery systolic pressure. The tricuspid regurgitant velocity is 3.63 m/s, and with an assumed right atrial pressure of 8 mmHg, the estimated right ventricular systolic pressure is 33.8 mmHg. Left Atrium: Left atrial size was normal in size. Right Atrium: Right atrial size was normal in size. Pericardium: There is a pleural effusion. There is no evidence of pericardial effusion. Mitral Valve: The mitral valve is normal in structure. There is mild calcification of the mitral valve leaflet(s). Moderate mitral annular calcification. Mild mitral valve regurgitation. No evidence of mitral valve stenosis. MV peak gradient, 8.4 mmHg. The mean mitral valve gradient is 2.0 mmHg. Tricuspid Valve: The tricuspid valve is  normal in structure. Tricuspid valve regurgitation is trivial. Aortic Valve: The aortic valve is tricuspid. Aortic valve regurgitation is not visualized. Mild aortic valve sclerosis is present, with no evidence of aortic valve stenosis. Pulmonic Valve: The pulmonic valve was normal in structure. Pulmonic valve regurgitation is not visualized. Aorta: The aortic root is normal in size and structure. Venous: The inferior vena cava is normal in size with less than 50% respiratory variability, suggesting right atrial pressure of 8 mmHg. IAS/Shunts: No atrial level shunt detected by color flow Doppler.  LEFT VENTRICLE PLAX 2D LVIDd:         3.15 cm     Diastology LVIDs:         2.00 cm     LV e' medial:    3.57 cm/s LV PW:         1.05 cm     LV E/e' medial:  19.8 LV IVS:        1.00 cm     LV e' lateral:   2.76 cm/s LVOT diam:     1.50 cm     LV E/e' lateral: 25.6 LV SV:  41 LV SV Index:   29 LVOT Area:     1.77 cm  LV Volumes (MOD) LV vol d, MOD A2C: 34.0 ml LV vol d, MOD A4C: 40.7 ml LV vol s, MOD A2C: 15.3 ml LV vol s, MOD A4C: 10.5 ml LV SV MOD A2C:     18.7 ml LV SV MOD A4C:     40.7 ml LV SV MOD BP:      24.1 ml RIGHT VENTRICLE            IVC RV S prime:     5.77 cm/s  IVC diam: 1.80 cm TAPSE (M-mode): 1.2 cm LEFT ATRIUM             Index       RIGHT ATRIUM           Index LA diam:        3.30 cm 2.29 cm/m  RA Area:     10.30 cm LA Vol (A2C):   15.9 ml 11.03 ml/m RA Volume:   19.80 ml  13.74 ml/m LA Vol (A4C):   19.1 ml 13.25 ml/m LA Biplane Vol: 17.7 ml 12.28 ml/m  AORTIC VALVE LVOT Vmax:   101.00 cm/s LVOT Vmean:  64.900 cm/s LVOT VTI:    0.234 m  AORTA Ao Root diam: 2.30 cm MITRAL VALVE               TRICUSPID VALVE MV Area (PHT): 4.15 cm    TR Peak grad:   52.7 mmHg MV Peak grad:  8.4 mmHg    TR Vmax:        363.00 cm/s MV Mean grad:  2.0 mmHg MV Vmax:       1.45 m/s    SHUNTS MV Vmean:      56.3 cm/s   Systemic VTI:  0.23 m MV Decel Time: 183 msec    Systemic Diam: 1.50 cm MV E velocity: 70.78  cm/s Loralie Champagne MD Electronically signed by Loralie Champagne MD Signature Date/Time: 06/03/2020/12:42:02 PM    Final    VAS US CAROTID  Result Date: 06/05/2020 Carotid Arterial Duplex Study Indications:  Speech disturbance. Risk Factors: Hyperlipidemia, Diabetes. Performing Technologist: Maudry Mayhew MHA, RDMS, RVT, RDCS  Examination Guidelines: A complete evaluation includes B-mode imaging, spectral Doppler, color Doppler, and power Doppler as needed of all accessible portions of each vessel. Bilateral testing is considered an integral part of a complete examination. Limited examinations for reoccurring indications may be performed as noted.  Right Carotid Findings: +----------+-------+-------+--------+---------------------------------+--------+           PSV    EDV    StenosisPlaque Description               Comments           cm/s   cm/s                                                     +----------+-------+-------+--------+---------------------------------+--------+ CCA Prox  55     7                                                        +----------+-------+-------+--------+---------------------------------+--------+  CCA Distal62     9              smooth and heterogenous                   +----------+-------+-------+--------+---------------------------------+--------+ ICA Prox  54     16             smooth and heterogenous                   +----------+-------+-------+--------+---------------------------------+--------+ ICA Distal73     20                                                       +----------+-------+-------+--------+---------------------------------+--------+ ECA       68                    heterogenous, irregular and                                               calcific                                  +----------+-------+-------+--------+---------------------------------+--------+  +----------+--------+-------+----------------+-------------------+           PSV cm/sEDV cmsDescribe        Arm Pressure (mmHG) +----------+--------+-------+----------------+-------------------+ [id number]             Multiphasic, WNL                    +----------+--------+-------+----------------+-------------------+ +---------+--------+--+--------+-+---------+ VertebralPSV cm/s36EDV cm/s7Antegrade +---------+--------+--+--------+-+---------+  Left Carotid Findings: +----------+--------+--------+--------+---------------------+------------------+           PSV cm/sEDV cm/sStenosisPlaque Description   Comments           +----------+--------+--------+--------+---------------------+------------------+ CCA Prox  58      9                                    intimal thickening +----------+--------+--------+--------+---------------------+------------------+ CCA Distal70      13              smooth and                                                                heterogenous                            +----------+--------+--------+--------+---------------------+------------------+ ICA Prox  71      14              heterogenous and  irregular                               +----------+--------+--------+--------+---------------------+------------------+ ICA Distal80      12                                                      +----------+--------+--------+--------+---------------------+------------------+ ECA       87                      smooth, heterogenous                                                      and calcific                            +----------+--------+--------+--------+---------------------+------------------+ +----------+--------+--------+----------------+-------------------+           PSV cm/sEDV cm/sDescribe        Arm Pressure (mmHG)  +----------+--------+--------+----------------+-------------------+ [id number]              Multiphasic, WNL                    +----------+--------+--------+----------------+-------------------+ +---------+--------+--+--------+-+---------+ VertebralPSV cm/s37EDV cm/s6Antegrade +---------+--------+--+--------+-+---------+   Summary: Right Carotid: Velocities in the right ICA are consistent with a 1-39% stenosis. Left Carotid: Velocities in the left ICA are consistent with a 1-39% stenosis. Vertebrals:  Bilateral vertebral arteries demonstrate antegrade flow. Subclavians: Normal flow hemodynamics were seen in bilateral subclavian              arteries. *See table(s) above for measurements and observations.  Electronically signed by Antony Contras MD on 06/05/2020 at 2:20:46 PM.    Final    US Abdomen Limited RUQ (LIVER/GB)  Result Date: 05/29/2020 CLINICAL DATA:  87 year old female with right upper quadrant abdominal pain. EXAM: ULTRASOUND ABDOMEN LIMITED RIGHT UPPER QUADRANT COMPARISON:  Right upper quadrant ultrasound dated 06/13/2009. FINDINGS: Gallbladder: There is sludge and small stones within the gallbladder. There is no gallbladder wall thickening or pericholecystic fluid. Negative sonographic Murphy sign. Common bile duct: Diameter: 5 mm Liver: Coarsened liver echotexture with slight surface nodularity consistent morphologic changes of cirrhosis. Portal vein is patent on color Doppler imaging with normal direction of blood flow towards the liver. Other: None. IMPRESSION: 1. Cholelithiasis without sonographic evidence of acute cholecystitis. 2. Cirrhosis. 3. Patent main portal vein with hepatopetal flow. Electronically Signed   By: Anner Crete M.D.   On: 05/29/2020 22:19       Discharge Exam: Vitals:   06/10/20 0849 06/10/20 0850  BP: (!) 189/75 (!) 189/75  Pulse: 75 75  Resp: 18 18  Temp: 98.5 F (36.9 C) 98.5 F (36.9 C)  SpO2:  99%    General: Pt is alert, awake, not  in acute distress Cardiovascular: RRR, S1/S2 +, no edema Respiratory: CTA bilaterally, no wheezing, no rhonchi, no respiratory distress, no conversational dyspnea  Abdominal: Soft, NT, ND, bowel sounds + Extremities: no edema, no cyanosis Psych: Normal mood and affect, stable judgement and insight     The results of significant diagnostics from this hospitalization (  including imaging, microbiology, ancillary and laboratory) are listed below for reference.     Microbiology: Recent Results (from the past 240 hour(s))  Surgical pcr screen     Status: None   Collection Time: 05/31/20  8:24 PM   Specimen: Nasal Mucosa; Nasal Swab  Result Value Ref Range Status   MRSA, PCR NEGATIVE NEGATIVE Final   Staphylococcus aureus NEGATIVE NEGATIVE Final    Comment: (NOTE) The Xpert SA Assay (FDA approved for NASAL specimens in patients 85 years of age and older), is one component of a comprehensive surveillance program. It is not intended to diagnose infection nor to guide or monitor treatment. Performed at [facility], [place] [address] [place], [place] [address]      Labs: BNP (last 3 results) No results for input(s): BNP in the last 8760 hours. Basic Metabolic Panel: Recent Labs  Lab 06/04/20 0213 06/05/20 0327 06/06/20 0116 06/07/20 0630  NA 142 143 143 142  K 4.0 3.9 3.8 4.2  CL 114* 115* 113* 115*  CO2 19* 19* 19* 18*  GLUCOSE 103* 178* 121* 128*  BUN 28* 26* 21 21  CREATININE 1.18* 1.20* 1.22* 1.03*  CALCIUM 8.4* 8.5* 8.6* 8.7*  MG 1.9 2.0 2.0 1.9  PHOS 3.0 3.3 3.6 4.2   Liver Function Tests: Recent Labs  Lab 06/04/20 0213 06/05/20 0327 06/06/20 0116 06/07/20 0630  AST 43* 25 23 29   ALT 56* 43 39 36  ALKPHOS 200* 201* 181* 171*  BILITOT 0.8 0.7 0.6 0.5  PROT 5.0* 5.0* 4.9* 5.0*  ALBUMIN 2.3* 2.1* 2.0* 2.3*   No results for input(s): LIPASE, AMYLASE in the last 168 hours. No results for input(s): AMMONIA in the last 168 hours. CBC: Recent Labs  Lab  06/04/20 0213 06/05/20 0327 06/06/20 0116 06/07/20 0630  WBC 11.4* 10.1 10.5 9.0  NEUTROABS 7.9* 6.6 6.4 4.7  HGB 9.8* 9.4* 9.0* 9.0*  HCT 30.3* 27.5* 27.5* 28.1*  MCV 98.7 97.5 98.2 100.0  PLT 123* 143* 145* 171   Cardiac Enzymes: No results for input(s): CKTOTAL, CKMB, CKMBINDEX, TROPONINI in the last 168 hours. BNP: Invalid input(s): POCBNP CBG: Recent Labs  Lab 06/09/20 0606 06/09/20 1219 06/09/20 1609 06/09/20 2108 06/10/20 0618  GLUCAP 73 125* 213* 174* 158*   D-Dimer No results for input(s): DDIMER in the last 72 hours. Hgb A1c No results for input(s): HGBA1C in the last 72 hours. Lipid Profile No results for input(s): CHOL, HDL, LDLCALC, TRIG, CHOLHDL, LDLDIRECT in the last 72 hours. Thyroid function studies No results for input(s): TSH, T4TOTAL, T3FREE, THYROIDAB in the last 72 hours.  Invalid input(s): FREET3 Anemia work up No results for input(s): VITAMINB12, FOLATE, FERRITIN, TIBC, IRON, RETICCTPCT in the last 72 hours. Urinalysis    Component Value Date/Time   COLORURINE YELLOW 03/06/2019 0050   APPEARANCEUR CLEAR 03/06/2019 0050   LABSPEC >1.046 (H) 03/06/2019 0050   PHURINE 5.0 03/06/2019 0050   GLUCOSEU 50 (A) 03/06/2019 0050   HGBUR SMALL (A) 03/06/2019 0050   HGBUR large 06/08/2009 1435   BILIRUBINUR NEGATIVE 03/06/2019 0050   KETONESUR 20 (A) 03/06/2019 0050   PROTEINUR 30 (A) 03/06/2019 0050   UROBILINOGEN 0.2 03/08/2011 2011   NITRITE NEGATIVE 03/06/2019 0050   LEUKOCYTESUR NEGATIVE 03/06/2019 0050   Sepsis Labs Invalid input(s): PROCALCITONIN,  WBC,  LACTICIDVEN Microbiology Recent Results (from the past 240 hour(s))  Surgical pcr screen     Status: None   Collection Time: 05/31/20  8:24 PM   Specimen: Nasal Mucosa;  Nasal Swab  Result Value Ref Range Status   MRSA, PCR NEGATIVE NEGATIVE Final   Staphylococcus aureus NEGATIVE NEGATIVE Final    Comment: (NOTE) The Xpert SA Assay (FDA approved for NASAL specimens in patients  23 years of age and older), is one component of a comprehensive surveillance program. It is not intended to diagnose infection nor to guide or monitor treatment. Performed at [facility], [place] [address], [place],  [address]      Patient was seen and examined on the day of discharge and was found to be in stable condition. Time coordinating discharge: 25 minutes including assessment and coordination of care, as well as examination of the patient.   SIGNED:  Dessa Phi, DO [facility] 06/10/2020, 11:39 AM

## 2020-06-10 NOTE — Progress Notes (Signed)
Meredith Staggers, MD  Physician  Physical Medicine and Rehabilitation  PMR Pre-admission     Signed  Date of Service:  06/06/2020 12:13 PM      Related encounter: Admission (Discharged) from 05/30/2020 in [facility]       Signed          Show:Clear all '[x]' Manual'[x]' Template'[]' Copied  Added by: '[x]' Cristina Gong, RN'[x]' Lind Covert, Lauren Mamie Nick, CCC-SLP'[x]' Genella Mech, CCC-SLP'[x]' Meredith Staggers, MD   '[]' Hover for details  PMR Admission Coordinator Pre-Admission Assessment   Patient: Taylor Castaneda is an 87 y.o., female MRN: [id number] DOB: 02-Nov-1932 Height: '4\' 11"'  (149.9 cm) Weight: 50.8 kg   Insurance Information HMO: Yes    PPO:      PCP:      IPA:      80/20:      OTHER:  PRIMARY: BCBS Medicare      Policy#: [id number]      Subscriber: patient CM Name:  Larene Beach   Phone#: [phone]    Fax#: [phone] Pre-Cert#: TBD     Employer:  Approval for CIR received from [facility] on 06/08/20.  approved for 7 days with f/u to [place]  Benefits:  Phone #: [phone]     Name: Eff. Date: 06/04/2020-still active     Deduct: $0 (does not have)      Out of Pocket Max: $4,200 ($0 met)      Life Max: NA CIR: $335/per visit co-pay     SNF: 100% coverage, 0% co-insurance days 1-20; $188/per day co-pay days 21-100 Outpatient: $40 co-pay per visit; limited by medical necessity     Co-Pay:  Home Health: 100%  Coverage; limited by medical necessity      Co-Pay: 0% DME: 80% coverage     Co-Pay: 20% co-insurance Providers: in-network SECONDARY:       Policy#:      Phone#:    Development worker, community:       Phone#:    The Engineer, petroleum" for patients in Inpatient Rehabilitation Facilities with attached "Privacy Act [place] Records" was provided and verbally reviewed with: Patient and Family   Emergency Contact Information         Contact Information     Name Relation Home Work Mobile    [place] Daughter [phone]    [phone]    Gaynell Face [phone]             Current Medical History  Patient Admitting Diagnosis: cerebral thrombosis with cerebral infarction and s/p cholelithiasis   History of Present Illness: Pt is an 87 year old female with medical hx significant for: DM II, HLD, anemia, seizures, h/o colon CA s/p resection. Pt presented to ED via EMA on 05/30/20 after being found on the floor by family. EMS witnessed pt have generalized tonic-clonic seizure with urinary incontinence lasting approximately 1 minute prior to self resolving.  Pt had also c/o epigastric abdominal pain with radiation to back which lasted ~6-7 hours prior to self resolving.  CT of head and cervical spine did not reveal any acute abnormalities.  CT imaging of thoracic and lumbar spine noted sever spinal stenosis at L4-5 and large non obstructing left renal stone.  RUQ revealed cholelithiasis without evidence of acute cholecystitis and cirrhosis. Pt underwent ERCP on 05/31/20. She underwent cholecystectomy on 05/31/20. Code stroke was called d/t acute changes in pt's language and speech skills.  MRI done and showed multiple punctate foci of acute ischemia within the right  centrum semi ovale with no hemorrhages or mass-effect seen. Pt also had MRA of head, echocardiogram as well as Carotid Dopplers. Neurology determined pt had an acute CVA. Therapy evaluations completed and noted deficits in mobility, cognition and ability to complete ADLs.   CIR recommended to help pt return to PLOF.   Complete NIHSS TOTAL: 1   Patient's medical record from [facility]) has been reviewed by the rehabilitation admission coordinator and physician.   Past Medical History      Past Medical History:  Diagnosis Date  . Anemia    . Diabetes mellitus, type II ([place])    . Glaucoma      Dr.Hecker  . History of colon cancer 1998    Dr. Lennie Hummer  . Hyperlipemia    . Osteopenia 02/2012, 03/2014    DEXA hip -2.2  . Renal insufficiency     . Seizures ([place])    . Stenosing tenosynovitis of finger of left hand 2016    index - s/p steroid injection x2      Family History   family history includes Breast cancer in her mother and sister; Cancer in her mother and sister; Diabetes in her father and sister; Heart attack (age of onset: 56) in her father; Heart disease in her father; Hypertension in her father and son; Peripheral vascular disease in her sister; Stroke (age of onset: 58) in her father.   Prior Rehab/Hospitalizations Has the patient had prior rehab or hospitalizations prior to admission? No   Has the patient had major surgery during 100 days prior to admission? Yes              Current Medications   Current Facility-Administered Medications:  .  acetaminophen (TYLENOL) tablet 1,000 mg, 1,000 mg, Oral, Q6H PRN, Sheikh, Omair Latif, DO .  albuterol (PROVENTIL) (2.5 MG/3ML) 0.083% nebulizer solution 2.5 mg, 2.5 mg, Nebulization, Q6H PRN, Romana Juniper A, MD .  aspirin EC tablet 325 mg, 325 mg, Oral, Daily, Raiford Noble Latif, DO, 325 mg at 06/10/20 1119 .  atorvastatin (LIPITOR) tablet 40 mg, 40 mg, Oral, Daily, Raiford Noble Latif, DO, 40 mg at 06/10/20 1119 .  carvedilol (COREG) tablet 12.5 mg, 12.5 mg, Oral, BID, Romana Juniper A, MD, 12.5 mg at 06/10/20 1119 .  clopidogrel (PLAVIX) tablet 75 mg, 75 mg, Oral, Daily, Rosalin Hawking, MD, 75 mg at 06/10/20 1120 .  feeding supplement (ENSURE ENLIVE / ENSURE PLUS) liquid 237 mL, 237 mL, Oral, Q1500, Raiford Noble Latif, DO, 237 mL at 06/09/20 1422 .  fentaNYL (SUBLIMAZE) injection 12.5 mcg, 12.5 mcg, Intravenous, Q4H PRN, Romana Juniper A, MD, 12.5 mcg at 06/01/20 1541 .  heparin injection 5,000 Units, 5,000 Units, Subcutaneous, Q8H, Raiford Noble [place], DO, 5,000 Units at 06/10/20 0536 .  hydrALAZINE (APRESOLINE) injection 10 mg, 10 mg, Intravenous, Q6H PRN, Raiford Noble Latif, DO, 10 mg at 06/09/20 0854 .  insulin aspart (novoLOG) injection 0-5 Units, 0-5 Units,  Subcutaneous, QHS, Raiford Noble [place], [place], 2 Units at 06/04/20 2201 .  insulin aspart (novoLOG) injection 0-9 Units, 0-9 Units, Subcutaneous, TID WC, Raiford Noble [place], [place], 2 Units at 06/10/20 [phone] .  insulin glargine (LANTUS) injection 8 Units, 8 Units, Subcutaneous, QHS, Clovis Riley, MD, 8 Units at 06/09/20 2118 .  lactated ringers infusion, , Intravenous, Continuous, Romana Juniper A, MD, Last Rate: 10 mL/hr at 06/01/20 1243, New Bag at 06/01/20 1347 .  levETIRAcetam (KEPPRA) tablet 500 mg, 500 mg, Oral, BID, Rosalin Hawking, MD, 500 mg  at 06/10/20 1120 .  ondansetron (ZOFRAN) tablet 4 mg, 4 mg, Oral, Q6H PRN **OR** ondansetron (ZOFRAN) injection 4 mg, 4 mg, Intravenous, Q6H PRN, Kae Heller, Chelsea A, MD .  polyvinyl alcohol (LIQUIFILM TEARS) 1.4 % ophthalmic solution 1 drop, 1 drop, Both Eyes, TID PRN, Clovis Riley, MD, 1 drop at 06/01/20 1704 .  ramipril (ALTACE) capsule 5 mg, 5 mg, Oral, Daily, Romana Juniper A, MD, 5 mg at 06/10/20 1119 .  sodium chloride flush (NS) 0.9 % injection 3 mL, 3 mL, Intravenous, Q12H, Kae Heller, Chelsea A, MD, 3 mL at 06/10/20 1120 .  traMADol (ULTRAM) tablet 50-100 mg, 50-100 mg, Oral, Q12H PRN, Dessa Phi, DO   Patients Current Diet:     Diet Order                      Diet Carb Modified Fluid consistency: Thin; Room service appropriate? Yes  Diet effective now                      Precautions / Restrictions Precautions Precautions: Fall Precaution Comments: monitor for vitals and neuro symptoms Restrictions Weight Bearing Restrictions: No    Has the patient had 2 or more falls or a fall with injury in the past year? Yes   Prior Activity Level Limited Community (1-2x/wk): gets out of house 2x/week   Prior Functional Level Self Care: Did the patient need help bathing, dressing, using the toilet or eating? Independent   Indoor Mobility: Did the patient need assistance with walking from room to room (with or without device)?  Independent   Stairs: Did the patient need assistance with internal or external stairs (with or without device)? Independent   Functional Cognition: Did the patient need help planning regular tasks such as shopping or remembering to take medications? Independent   Home Assistive Devices / [place] Devices/Equipment: Environmental consultant (specify type) Home Equipment: Walker - 2 wheels,Cane - single point,Grab bars - tub/shower,Shower seat   Prior Device Use: Indicate devices/aids used by the patient prior to current illness, exacerbation or injury? Walker and cane   Current Functional Level Cognition   Arousal/Alertness: Awake/alert Overall Cognitive Status: Impaired/Different from baseline Current Attention Level: Focused Orientation Level: Oriented X4 Following Commands: Follows one step commands consistently,Follows multi-step commands inconsistently Safety/Judgement: Decreased awareness of safety,Decreased awareness of deficits General Comments: Slow processing of cues with poor ability to only perform cues on L leg instead of R. Pt would get RW stuck on obstacles occasionally with min correction, thus decreased safety awareness. Memory: Impaired (recalled 2 of 4 words after 5 minute delay; 4/4 with cue) Memory Impairment: Storage deficit Awareness: Appears intact Problem Solving: Appears intact Safety/Judgment: Appears intact    Extremity Assessment (includes Sensation/Coordination)   Upper Extremity Assessment: Overall WFL for tasks assessed  Lower Extremity Assessment: Defer to PT evaluation     ADLs   Overall ADL's : Needs assistance/impaired Grooming: Minimal assistance,Standing Upper Body Dressing : Set up,Sitting Lower Body Dressing: Minimal assistance,Sit to/from stand Toilet Transfer: Minimal assistance,RW Toileting- Clothing Manipulation and Hygiene: Minimal assistance,Sit to/from stand Functional mobility during ADLs: Minimal assistance,Rolling walker General  ADL Comments: Min A for safety with cues for walker management and proximity. Patient frequently bumping into furniture in room on R and L.     Mobility   Overal bed mobility: Needs Assistance Bed Mobility: Supine to Sit Supine to sit: HOB elevated,Min assist Sit to supine: Min assist General bed mobility comments: Pt sitting  up in recliner upon arrival.     Transfers   Overall transfer level: Needs assistance Equipment used: Rolling walker (2 wheeled) Transfers: Sit to/from Stand Sit to Stand: Min assist General transfer comment: MinA for steadying coming to stand, requiring extra time to power up to stand. Initially reaching for RW but required extra time to follow cues to push up from chair for safety purposes instead.     Ambulation / Gait / Stairs / Wheelchair Mobility   Ambulation/Gait Ambulation/Gait assistance: Herbalist (Feet): 250 Feet (x1 standing rest break during) Assistive device: Rolling walker (2 wheeled) Gait Pattern/deviations: Step-through pattern,Decreased stride length,Trunk flexed,Decreased dorsiflexion - left General Gait Details: Slow, unsteady gait cuing pt to remain within middle of RW, with success. Demonstartes poor L foot clearance with decreased dorisflexion and hip flexion with swing compared to R, cued to correct by exaggerating "marching" with gait, noted success but pt performed on R side also despite cues just to attempt on L only. MinA to direct pt away from obstacles when she would get R side of RW stuck. Gait velocity: reduced Gait velocity interpretation: <1.8 ft/sec, indicate of risk for recurrent falls     Posture / Balance Dynamic Sitting Balance Sitting balance - Comments: close supervision for safety Balance Overall balance assessment: Needs assistance Sitting-balance support: Feet supported,No upper extremity supported Sitting balance-Leahy Scale: Fair Sitting balance - Comments: close supervision for safety Standing balance  support: During functional activity,Bilateral upper extremity supported Standing balance-Leahy Scale: Poor Standing balance comment: Reliant on UE support for balance. High Level Balance Comments: Pt's son present during session. Not able to get HR reading on pt as batteries dead. Standardized Balance Assessment Standardized Balance Assessment : TUG: Timed Up and Go Test Berg Balance Test Sit to Stand: Needs minimal aid to stand or to stabilize Standing Unsupported: Able to stand 30 seconds unsupported Sitting with Back Unsupported but Feet Supported on Floor or Stool: Able to sit 2 minutes under supervision Stand to Sit: Uses backs of legs against chair to control descent Transfers: Needs one person to assist Standing Unsupported with Eyes Closed: Able to stand 3 seconds Standing Ubsupported with Feet Together: Needs help to attain position and unable to hold for 15 seconds From Standing, Reach Forward with Outstretched Arm: Reaches forward but needs supervision From Standing Position, Pick up Object from Floor: Unable to try/needs assist to keep balance From Standing Position, Turn to Look Behind Over each Shoulder: Needs assist to keep from losing balance and falling Turn 360 Degrees: Needs assistance while turning Standing Unsupported, Alternately Place Feet on Step/Stool: Needs assistance to keep from falling or unable to try Standing Unsupported, One Foot in Front: Loses balance while stepping or standing Standing on One Leg: Unable to try or needs assist to prevent fall Total Score: 12 Timed Up and Go Test TUG: Normal TUG Normal TUG (seconds): 18.55     Special needs/care consideration Continuous Drip IV  lactated ringers infusion: 10 mL/hr, Skin abrasion: arm, hand/left; eccymosis: arm, hand/left, Diabetic management novoLOG: 0-5 units at bedtime; novoLOG 0-9 units 3x daily with meals; Lantus 8 units at bedtime and Designated visitor Alain Honey, son; [name], daughter     Previous Home Environment (from acute therapy documentation) Living Arrangements: Alone  Lives With: Alone Available Help at Discharge: Family,Available PRN/intermittently Type of Home: Other(Comment) (condo) Home Layout: One level Home Access: Elevator Entrance Stairs-Rails: Can reach both Entrance Stairs-Number of Steps: 2 Bathroom Shower/Tub: Multimedia programmer: Associate Professor  Accessibility: Yes How Accessible: Accessible via walker Home Care Services: No Additional Comments: has a great deal of equipment from caring for her husband   Discharge Living Setting Plans for Discharge Living Setting: Patient's home Type of Home at Discharge: Other (Comment) (condo) Discharge Home Layout: One level Discharge Home Access: Elevator Discharge Bathroom Shower/Tub: Walk-in shower Discharge Bathroom Toilet: Standard Discharge Bathroom Accessibility: Yes How Accessible: Accessible via walker Does the patient have any problems obtaining your medications?: No   Social/Family/Support Systems Anticipated Caregiver: Alain Honey, son; [name], daughter Anticipated Caregiver's Contact Information: Randall Hiss: [name]: [phone] Caregiver Availability: Intermittent Discharge Plan Discussed with Primary Caregiver: Yes Is Caregiver In Agreement with Plan?: Yes Does Caregiver/Family have Issues with Lodging/Transportation while Pt is in Rehab?: No   Goals Patient/Family Goal for Rehab: Mod I: PT/OT, Min A-Supervision: ST Expected length of stay: 7-10 days Pt/Family Agrees to Admission and willing to participate: Yes Program Orientation Provided & Reviewed with Pt/Caregiver Including Roles  & Responsibilities: Yes   Decrease burden of Care through IP rehab admission: NA   Possible need for SNF placement upon discharge: NA   Patient Condition: I have reviewed medical records from [facility], spoken with CM, and patient and family member. I met with  patient at the bedside and discussed via phone for inpatient rehabilitation assessment.  Patient will benefit from ongoing PT, SLP and OT, can actively participate in 3 hours of therapy a day 5 days of the week, and can make measurable gains during the admission.  Patient will also benefit from the coordinated team approach during an Inpatient Acute Rehabilitation admission.  The patient will receive intensive therapy as well as Rehabilitation physician, nursing, social worker, and care management interventions.  Due to safety, skin/wound care, disease management, medication administration, pain management and patient education the patient requires 24 hour a day rehabilitation nursing.  The patient is currently min A with mobility and basic ADLs.  Discharge setting and therapy post discharge at home with home health is anticipated.  Patient has agreed to participate in the Acute Inpatient Rehabilitation Program and will admit today.   Preadmission Screen Completed By: Clemens Catholic with updates by  Cleatrice Burke, 06/10/2020 11:37 AM ______________________________________________________________________   Discussed status with Dr. Naaman Plummer on 06/10/2020 at 1138 and received approval for admission today.   Admission [place] with updates by   Cleatrice Burke, RN, time [phone] Date 06/10/2020    Assessment/Plan: Diagnosis: right centrum semiovale infarct after gall bladder surgery 1. Does the need for close, 24 hr/day Medical supervision in concert with the patient's rehab needs make it unreasonable for this patient to be served in a less intensive setting? Yes 2. Co-Morbidities requiring supervision/potential complications: DM, sz, hx of colon ca 3. Due to bladder management, bowel management, safety, skin/wound care, disease management, medication administration, pain management and patient education, does the patient require 24 hr/day rehab nursing? Yes 4. Does the patient require  coordinated care of a physician, rehab nurse, PT, OT, and SLP to address physical and functional deficits in the context of the above medical diagnosis(es)? Yes Addressing deficits in the following areas: balance, endurance, locomotion, strength, transferring, bowel/bladder control, bathing, dressing, feeding, grooming, toileting, cognition, speech, language, swallowing and psychosocial support 5. Can the patient actively participate in an intensive therapy program of at least 3 hrs of therapy 5 days a week? Yes 6. The potential for patient to make measurable gains while on inpatient rehab is excellent 7. Anticipated functional  outcomes upon discharge from inpatient rehab: modified independent PT, modified independent OT, supervision and min assist SLP 8. Estimated rehab length of stay to reach the above functional goals is: 7-10 days 9. Anticipated discharge destination: Home 10. Overall Rehab/Functional Prognosis: excellent     MD Signature: Meredith Staggers, MD, [facility] 06/10/2020           Revision History                                       Note Details  Author Meredith Staggers, MD File Time 06/10/2020 11:44 AM  Author Type Physician Status Signed  Last Editor Meredith Staggers, MD Service Physical Medicine and [place] # [id number] Admit Date 06/10/2020

## 2020-06-10 NOTE — TOC Transition Note (Signed)
Transition of Care [facility]) - CM/SW Discharge Note   Patient Details  Name: Taylor Castaneda MRN: [id number] Date of Birth: 05-01-1933  Transition of [facility]) CM/SW Contact:  Pollie Friar, RN Phone Number: 06/10/2020, 11:57 AM   Clinical Narrative:    Pt is discharging to CIR today. CM signing off.   Final next level of care: IP Rehab Facility Barriers to Discharge: No Barriers Identified   Patient Goals and CMS Choice Patient states their goals for this hospitalization and ongoing recovery are:: to return to home CMS Medicare.gov Compare Post Acute Care list provided to:: Patient Choice offered to / list presented to : [name]  Discharge Placement                       Discharge Plan and Services   Discharge Planning Services: CM Consult Post Acute Care Choice: IP Rehab          DME Arranged: N/A                    Social Determinants of Health (SDOH) Interventions     Readmission Risk Interventions No flowsheet data found.

## 2020-06-10 NOTE — Progress Notes (Signed)
Inpatient Rehabilitation Medication Review by a Pharmacist  A complete drug regimen review was completed for this patient to identify any potential clinically significant medication issues.  Clinically significant medication issues were identified:  no   Type of Medication Issue Identified Description of Issue Urgent (address now) Non-Urgent (address on AM team rounds) Plan Plan Accepted by Provider? (Yes / No / Pending AM Rounds)  Drug Interaction(s) (clinically significant)       Duplicate Therapy       Allergy       No Medication Administration End Date  Of note, DAPT is to continue x 3 months. Non-urgent Add stop date to discharge information.   Incorrect Dose       Additional Drug Therapy Needed       Other  PTA meds not resumed: Vitamin D, MVI, FeSO4, and KCl Non-urgent Monitor, resume as appropriate     Name of provider notified for urgent issues identified: n/a  Provider Method of Notification: n/a   For non-urgent medication issues to be resolved on team rounds tomorrow morning a [facility] Secure Chat Handoff was sent to: n/a   Pharmacist comments: see above  Time spent performing this drug regimen review (minutes):  15   Venice Marcucci, Rocky Crafts 06/10/2020 5:09 PM

## 2020-06-11 ENCOUNTER — Inpatient Hospital Stay (HOSPITAL_COMMUNITY): Payer: Medicare Other | Admitting: Physical Therapy

## 2020-06-11 ENCOUNTER — Inpatient Hospital Stay (HOSPITAL_COMMUNITY): Payer: Medicare Other

## 2020-06-11 LAB — GLUCOSE, CAPILLARY
Glucose-Capillary: 90 mg/dL (ref 70–99)
Glucose-Capillary: 133 mg/dL — ABNORMAL HIGH (ref 70–99)
Glucose-Capillary: 159 mg/dL — ABNORMAL HIGH (ref 70–99)
Glucose-Capillary: 228 mg/dL — ABNORMAL HIGH (ref 70–99)

## 2020-06-11 NOTE — Evaluation (Signed)
Speech Language Pathology Assessment and Plan  Patient Details  Name: Taylor Castaneda MRN: [id number] Date of Birth: Jan 09, 1933  SLP Diagnosis: Cognitive Impairments;Speech and Language deficits  Rehab Potential: Good ELOS: 7-10 days    Today's Date: 06/11/2020 SLP Individual Time: 1100-1155 SLP Individual Time Calculation (min): 55 min   Hospital Problem: Principal Problem:   Subcortical infarction [facility])  Past Medical History:  Past Medical History:  Diagnosis Date  . Anemia   . Diabetes mellitus, type II ([place])   . Glaucoma    Dr.Hecker  . History of colon cancer 1998   Dr. Lennie Hummer  . Hyperlipemia   . Osteopenia 02/2012, 03/2014   DEXA hip -2.2  . Renal insufficiency   . Seizures ([place])   . Stenosing tenosynovitis of finger of left hand 2016   index - s/p steroid injection x2   Past Surgical History:  Past Surgical History:  Procedure Laterality Date  . ABI  11/2013   WNL  . APPENDECTOMY    . BILIARY DILATION  05/31/2020   Procedure: BILIARY DILATION;  Surgeon: Jackquline Denmark, MD;  Location: [facility] ENDOSCOPY;  Service: Endoscopy;;  . BIOPSY  05/31/2020   Procedure: BIOPSY;  Surgeon: Jackquline Denmark, MD;  Location: [place];  Service: Endoscopy;;  . CARPAL TUNNEL RELEASE     Bilateral   . CATARACT EXTRACTION Bilateral 2006   Implants  . CHOLECYSTECTOMY N/A 06/01/2020   Procedure: LAPAROSCOPIC CHOLECYSTECTOMY;  Surgeon: Clovis Riley, MD;  Location: [place];  Service: General;  Laterality: N/A;  . COLECTOMY  1998   For cancer  . COLONOSCOPY  2008   Dr.Stark, Due 2011  . COLONOSCOPY  06/2013   1 hyperplastic polyp, ileocecal anastomosis [facility] Plan)  . dexa  02/2012, 03/2014   T score -2.2 at hip overall stable  . ENDOSCOPIC RETROGRADE CHOLANGIOPANCREATOGRAPHY (ERCP) WITH PROPOFOL N/A 05/31/2020   Procedure: ENDOSCOPIC RETROGRADE CHOLANGIOPANCREATOGRAPHY (ERCP) WITH PROPOFOL;  Surgeon: Jackquline Denmark, MD;  Location: [facility] ENDOSCOPY;  Service: Endoscopy;  Laterality: N/A;  .  REFRACTIVE SURGERY    . REMOVAL OF STONES  05/31/2020   Procedure: REMOVAL OF STONES;  Surgeon: Jackquline Denmark, MD;  Location: [facility] ENDOSCOPY;  Service: Endoscopy;;  . [name]  05/31/2020   Procedure: [name];  Surgeon: Jackquline Denmark, MD;  Location: [facility] ENDOSCOPY;  Service: Endoscopy;;  . TONSILLECTOMY AND ADENOIDECTOMY    . TOTAL KNEE ARTHROPLASTY Left 12/17/2016   Procedure: LEFT TOTAL KNEE ARTHROPLASTY;  Surgeon: Gaynelle Arabian, MD;  Location: [facility];  Service: Orthopedics;  Laterality: Left;    Assessment / Plan / Recommendation Clinical Impression 87 y.o. female presenting after syncopal episode followed by seizure. RUQ ultrasound (+) cholelithiasis w/o acute cholecystitis and cirrhosis s/p ERCP on 12/28 by Dr. Lyndel Safe. Patient also found to have AKI on CKD. CT head (-) for acute findings. Evidence of mild chronic small vessel ischemic disease and moderately advanced cerebral atrophy. MRI (+) intracranial stenosis and multiple new punctate foci of acute ischemia within the right centrum  semiovale. PMHx significant for DMII, HLD, seizures, and Hx colon CA s/p resection. Pt with documented Aphasia in acute care.  Pt presents with mild expressive/receptive Aphasia as evidenced by MS Aphasia Screening Test. Expressive subscale 40/50 and receptive subscale 44/50. Pt with min difficulty with naming objects and pictures, min word finding deficits throughout assessment which daughter reports is a change from baseline. Pt able to repeat words 100%, phrases and sentences with 50% accuracy. Pt able to follow simple 1 and 2 step directions WFL,  more complex directions with 75% accuracy.   Pt presents with Moderately severe cognitive impairment as evidenced by SLUMS score of 7/30 [facility] = 27+), with most notable impairments in immediate and delayed recall, working memory, verbal fluency, and executive function. Pt clock drawing with inaccurate numbers and placement, unable to place hands with decreased  insight into errors. Pt benefits from mod multimodal cueing to increase awareness of errors and attempt to self correct. Pt does have help PRN from family, daughter is aware she will likely need more assistance at d/c due to current impairments.    Skilled Therapeutic Interventions          Pt participating in [place] and MS Aphasia screening to determine current cognitive linguistic and speech/;anguage function. Please see above for results.   SLP Assessment  Patient will need skilled [facility] [facility] during CIR admission    Recommendations  Recommendations for Other Services: Neuropsych consult Patient destination: Home Follow up Recommendations: Home Health SLP Equipment Recommended: To be determined    SLP Frequency 3 to 5 out of 7 days   SLP Duration  SLP Intensity  SLP Treatment/Interventions 7-10 days  Minumum of 1-2 x/day, 30 to 90 minutes  Cognitive remediation/compensation;Cueing hierarchy;Functional tasks;Therapeutic Exercise;Therapeutic Activities;Patient/family education    Pain Pain Assessment Pain Scale: 0-10 Pain Score: 0-No pain  Prior Functioning Cognitive/Linguistic Baseline: Baseline deficits Baseline deficit details: decreased STM Type of Home: House  Lives With: Alone Available Help at Discharge: Family;Available PRN/intermittently Vocation: Retired  SLP Evaluation Cognition Overall Cognitive Status: Impaired/Different from baseline Arousal/Alertness: Awake/alert Orientation Level: Oriented to person;Oriented to place;Oriented to time;Oriented to situation Memory: Impaired Memory Impairment: Storage deficit;Decreased short term memory Decreased Short Term Memory: Verbal basic Awareness: Appears intact Problem Solving: Impaired Problem Solving Impairment: Verbal basic;Verbal complex Safety/Judgment: Appears intact  Comprehension Auditory Comprehension Overall Auditory Comprehension: Impaired Yes/No Questions: Impaired Complex  Questions: 50-74% accurate Commands: Impaired Multistep Basic Commands: 50-74% accurate Conversation: Simple Visual Recognition/Discrimination Discrimination: Within Function Limits Reading Comprehension Reading Status: Within funtional limits Expression Expression Primary Mode of Expression: Verbal Verbal Expression Overall Verbal Expression: Impaired Initiation: No impairment Automatic Speech: Name;Social Response;Counting;Day of week;Month of year Level of Generative/Spontaneous Verbalization: Conversation Repetition: Impaired Level of Impairment: Sentence level Naming: Impairment Responsive: 76-100% accurate Confrontation: Within functional limits Divergent: 50-74% accurate Verbal Errors: Aware of errors;Phonemic paraphasias Pragmatics: No impairment Effective Techniques: Phonemic cues Non-Verbal Means of Communication: Not applicable Written Expression Dominant Hand: Right Written Expression: Exceptions to [facility] Dictation Ability: Word Oral Motor Oral Motor/Sensory Function Overall Oral Motor/Sensory Function: Within functional limits Motor Speech Overall Motor Speech: Appears within functional limits for tasks assessed Respiration: Within functional limits Phonation: Normal Resonance: Within functional limits Articulation: Within functional limitis Intelligibility: Intelligible Word: 75-100% accurate Phrase: 75-100% accurate Sentence: 75-100% accurate Conversation: 75-100% accurate  Care Tool Care Tool Cognition Expression of Ideas and Wants Expression of Ideas and Wants: Some difficulty - exhibits some difficulty with expressing needs and ideas (e.g, some words or finishing thoughts) or speech is not clear   Understanding Verbal and Non-Verbal Content Understanding Verbal and Non-Verbal Content: Usually understands - understands most conversations, but misses some part/intent of message. Requires cues at times to understand   Memory/Recall Ability *first 3 days  only Memory/Recall Ability *first 3 days only: Staff names and faces;That he or she is in a hospital/hospital unit;Current season    Intelligibility: Intelligible Word: 75-100% accurate Phrase: 75-100% accurate Sentence: 75-100% accurate Conversation: 75-100% accurate  Short Term Goals: Week 1: SLP Short  Term Goal 1 (Week 1): Pt will follow basic 2-3 step directions with 80% accuracy provided Min A verbal/visual cues. SLP Short Term Goal 2 (Week 1): Pt will demonstrate ability to use word-finding strategies in functional conversation and other targeted language tasks with Supervision A cues. SLP Short Term Goal 3 (Week 1): Pt will demonstrate ability to problem solve basic familiar functional situations with Min A verbal/visual cueing. SLP Short Term Goal 4 (Week 1): Pt will increase short term recall of functional information with use of compensatory strategies to 70% acc with min A verbal cues  Refer to Care Plan for Long Term Goals  Recommendations for other services: Neuropsych  Discharge Criteria: Patient will be discharged from SLP if patient refuses treatment 3 consecutive times without medical reason, if treatment goals not met, if there is a change in medical status, if patient makes no progress towards goals or if patient is discharged from hospital.  The above assessment, treatment plan, treatment alternatives and goals were discussed and mutually agreed upon: by patient  Dewaine Conger 06/11/2020, 11:53 AM

## 2020-06-11 NOTE — Evaluation (Signed)
Occupational Therapy Assessment and Plan  Patient Details  Name: Taylor Castaneda MRN: [id number] Date of Birth: [birth year]  OT Diagnosis: abnormal posture, cognitive deficits, hemiplegia affecting non-dominant side and muscle weakness (generalized) Rehab Potential: Rehab Potential (ACUTE ONLY): Good ELOS: 7-10   Today's Date: 06/11/2020 OT Individual Time: 0700-0800 OT Individual Time Calculation (min): 60 min     Hospital Problem: Principal Problem:   Subcortical infarction [facility])   Past Medical History:  Past Medical History:  Diagnosis Date  . Anemia   . Diabetes mellitus, type II ([place])   . Glaucoma    Dr.Hecker  . History of colon cancer 1998   Dr. Lennie Hummer  . Hyperlipemia   . Osteopenia 02/2012, 03/2014   DEXA hip -2.2  . Renal insufficiency   . Seizures ([place])   . Stenosing tenosynovitis of finger of left hand 2016   index - s/p steroid injection x2   Past Surgical History:  Past Surgical History:  Procedure Laterality Date  . ABI  11/2013   WNL  . APPENDECTOMY    . BILIARY DILATION  05/31/2020   Procedure: BILIARY DILATION;  Surgeon: Jackquline Denmark, MD;  Location: [facility] ENDOSCOPY;  Service: Endoscopy;;  . BIOPSY  05/31/2020   Procedure: BIOPSY;  Surgeon: Jackquline Denmark, MD;  Location: [place];  Service: Endoscopy;;  . CARPAL TUNNEL RELEASE     Bilateral   . CATARACT EXTRACTION Bilateral 2006   Implants  . CHOLECYSTECTOMY N/A 06/01/2020   Procedure: LAPAROSCOPIC CHOLECYSTECTOMY;  Surgeon: Clovis Riley, MD;  Location: [place];  Service: General;  Laterality: N/A;  . COLECTOMY  1998   For cancer  . COLONOSCOPY  2008   Dr.Stark, Due 2011  . COLONOSCOPY  06/2013   1 hyperplastic polyp, ileocecal anastomosis [facility] Plan)  . dexa  02/2012, 03/2014   T score -2.2 at hip overall stable  . ENDOSCOPIC RETROGRADE CHOLANGIOPANCREATOGRAPHY (ERCP) WITH PROPOFOL N/A 05/31/2020   Procedure: ENDOSCOPIC RETROGRADE CHOLANGIOPANCREATOGRAPHY (ERCP) WITH PROPOFOL;  Surgeon: Jackquline Denmark, MD;  Location: [facility] ENDOSCOPY;  Service: Endoscopy;  Laterality: N/A;  . REFRACTIVE SURGERY    . REMOVAL OF STONES  05/31/2020   Procedure: REMOVAL OF STONES;  Surgeon: Jackquline Denmark, MD;  Location: [facility] ENDOSCOPY;  Service: Endoscopy;;  . [name]  05/31/2020   Procedure: [name];  Surgeon: Jackquline Denmark, MD;  Location: [facility] ENDOSCOPY;  Service: Endoscopy;;  . TONSILLECTOMY AND ADENOIDECTOMY    . TOTAL KNEE ARTHROPLASTY Left 12/17/2016   Procedure: LEFT TOTAL KNEE ARTHROPLASTY;  Surgeon: Gaynelle Arabian, MD;  Location: [facility];  Service: Orthopedics;  Laterality: Left;    Assessment & Plan Clinical Impression: 87 y.o. female presenting after syncopal episode followed by seizure. RUQ ultrasound (+) cholelithiasis w/o acute cholecystitis and cirrhosis s/p ERCP on 12/28 by Dr. Lyndel Safe. Patient also found to have AKI on CKD. CT head (-) for acute findings. Evidence of mild chronic small vessel ischemic disease and moderately advanced cerebral atrophy. MRI (+) intracranial stenosis and multiple new punctate foci of acute ischemia within the right centrum  semiovale. PMHx significant for DMII, HLD, seizures, and Hx colon CA s/p resection.   Patient currently requires min with basic self-care skills and IADL secondary to muscle weakness, decreased cardiorespiratoy endurance, impaired timing and sequencing, unbalanced muscle activation and decreased coordination, decreased problem solving, decreased safety awareness and decreased memory and decreased sitting balance, decreased standing balance, decreased postural control, hemiplegia and decreased balance strategies.  Prior to hospitalization, patient could complete BADL/IADL with modified independent .  Patient will benefit from skilled intervention to decrease level of assist with basic self-care skills and increase independence with basic self-care skills prior to discharge home with care partner.  Anticipate patient will require 24 hour  supervision and follow up home health.  OT - End of Session Activity Tolerance: Tolerates 30+ min activity with multiple rests Endurance Deficit: Yes Endurance Deficit Description: easily SOB OT Assessment Rehab Potential (ACUTE ONLY): Good OT Barriers to Discharge: Decreased caregiver support;Lack of/limited family support OT Patient demonstrates impairments in the following area(s): Balance;Cognition;Endurance;Motor;Pain;Safety OT Basic ADL's Functional Problem(s): Grooming;Bathing;Dressing;Toileting OT Advanced ADL's Functional Problem(s): Simple Meal Preparation OT Transfers Functional Problem(s): Toilet;Tub/Shower OT Plan OT Intensity: Minimum of 1-2 x/day, 45 to 90 minutes OT Frequency: 5 out of 7 days OT Duration/Estimated Length of Stay: 7-10 OT Treatment/Interventions: Balance/vestibular training;Discharge planning;Pain management;Self Care/advanced ADL retraining;Therapeutic Activities;UE/LE Coordination activities;Therapeutic Exercise;Skin care/wound managment;Patient/family education;Functional mobility training;Disease mangement/prevention;Cognitive remediation/compensation;Community reintegration;DME/adaptive equipment instruction;Neuromuscular re-education;Psychosocial support;Splinting/orthotics;UE/LE Strength taining/ROM;Wheelchair propulsion/positioning OT Self Feeding Anticipated Outcome(s): S OT Basic Self-Care Anticipated Outcome(s): S OT Toileting Anticipated Outcome(s): S OT Bathroom Transfers Anticipated Outcome(s): S OT Recommendation Patient destination: Home Follow Up Recommendations: Home health OT Equipment Details: Pt has shower chair and BSC   OT Evaluation Precautions/Restrictions  Precautions Precautions: Fall Precaution Comments: monitor for vitals and neuro symptoms Restrictions Weight Bearing Restrictions: No General Chart Reviewed: Yes Family/Caregiver Present: No Vital Signs  Pain Pain Assessment Pain Score: 0-No pain Home Living/Prior  Functioning Home Living Family/patient expects to be discharged to:: Private residence Living Arrangements: Children Available Help at Discharge: Family,Available PRN/intermittently Type of Home: Other(Comment) Home Access: Scientist, research (physical sciences) of Steps: 2 Entrance Stairs-Rails: Can reach both Home Layout: One level Bathroom Shower/Tub: Multimedia programmer: Standard Bathroom Accessibility: Yes Additional Comments: has a great deal of equipment from caring for her husband  Lives With: Alone IADL History Homemaking Responsibilities: Yes Prior Function Level of Independence: Independent with basic ADLs,Independent with homemaking with ambulation Vocation: Retired Comments: Intermittent use of SPC in home. Use of RW in community dwellings. Vision Baseline Vision/History: No visual deficits Patient Visual Report: No change from baseline Vision Assessment?: No apparent visual deficits Perception  Perception: Within Functional Limits Praxis Praxis: Intact Cognition Overall Cognitive Status: Impaired/Different from baseline Arousal/Alertness: Awake/alert Orientation Level: Person;Place;Situation Person: Oriented Place: Oriented Situation: Oriented Year: 2022 Month: January Day of Week: Correct Memory: Impaired Memory Impairment: Storage deficit Immediate Memory Recall: Sock;Blue;Bed Memory Recall Sock: Not able to recall Memory Recall Blue: With Cue Memory Recall Bed: Without Cue Sensation Sensation Light Touch: Appears Intact Coordination Gross Motor Movements are Fluid and Coordinated: Yes Fine Motor Movements are Fluid and Coordinated: No Finger Nose Finger Test: [facility] Motor  Motor Motor: Hemiplegia Motor - Skilled Clinical Observations: mild L  Trunk/Postural Assessment  Cervical Assessment Cervical Assessment:  (head forward) Thoracic Assessment Thoracic Assessment:  (rounded shoudlers) Lumbar Assessment Lumbar Assessment:  (post pelvic  tilt) Postural Control Postural Control: Deficits on evaluation  Balance   MIN A static and dynamic balance Extremity/Trunk Assessment RUE Assessment RUE Assessment: Exceptions to [facility] General Strength Comments: generalized weakness LUE Assessment LUE Assessment: Exceptions to [facility] General Strength Comments: generalized weakness  Care Tool Care Tool Self Care Eating   Eating Assist Level: Set up assist    Oral Care    Oral Care Assist Level: Minimal Assistance - Patient > 75%    Bathing   Body parts bathed by patient: Right arm;Left arm;Chest;Abdomen;Front perineal area;Right upper leg;Left upper leg;Face Body parts bathed by helper: Buttocks;Right  lower leg;Left lower leg   Assist Level: Moderate Assistance - Patient 50 - 74%    Upper Body Dressing(including orthotics)   What is the patient wearing?: Pull over shirt   Assist Level: Minimal Assistance - Patient > 75%    Lower Body Dressing (excluding footwear)   What is the patient wearing?: Underwear/pull up;Pants Assist for lower body dressing: Maximal Assistance - Patient 25 - 49%    Putting on/Taking off footwear   What is the patient wearing?: Non-skid slipper socks Assist for footwear: Moderate Assistance - Patient 50 - 74%       Care Tool Toileting Toileting activity   Assist for toileting: Moderate Assistance - Patient 50 - 74%     Care Tool Bed Mobility Roll left and right activity        Sit to lying activity        Lying to sitting edge of bed activity         Care Tool Transfers Sit to stand transfer   Sit to stand assist level: Minimal Assistance - Patient > 75%    Chair/bed transfer   Chair/bed transfer assist level: Minimal Assistance - Patient > 75%     Toilet transfer   Assist Level: Minimal Assistance - Patient > 75%     Care Tool Cognition Expression of Ideas and Wants     Understanding Verbal and Non-Verbal Content     Memory/Recall Ability *first 3 days only      Refer to  Care Plan for Long Term Goals  SHORT TERM GOAL WEEK 1 OT Short Term Goal 1 (Week 1): STG=LTG d/t ELOS  Recommendations for other services: None    Skilled Therapeutic Intervention 1:1 Pt and daughter present for session (daughter comes half way through). Edu re OT role/purpose, CIR, ELOS, POC and PLF v CLOF. Pt completes shower level ADL as stated below with pt getting SOB with all mobility HHA amb level. O2 100 HR 83. BP elevated 188/61 after shower. Pt requires frequent rest breaks d/t poor activity tolerance. Pt would benefit from skilled OT to improve safety and independence with ADLs and decrease risk of falls in prep to go home. Exited session with pt seated in bed, exit alarm on and call light in reach    ADL ADL Eating: Supervision/safety Where Assessed-Eating: Bed level Grooming: Minimal assistance Where Assessed-Grooming: Edge of bed Upper Body Bathing: Supervision/safety Where Assessed-Upper Body Bathing: Shower Lower Body Bathing: Moderate assistance Where Assessed-Lower Body Bathing: Shower Upper Body Dressing: Minimal assistance Where Assessed-Upper Body Dressing: Edge of bed Lower Body Dressing: Maximal assistance Where Assessed-Lower Body Dressing: Edge of bed Toileting: Moderate assistance Where Assessed-Toileting: Glass blower/designer: Midwife: Energy manager: Environmental education officer Method: Ambulating Mobility  Transfers Sit to Stand: Minimal Assistance - Patient > 75% Stand to Sit: Minimal Assistance - Patient > 75%   Discharge Criteria: Patient will be discharged from OT if patient refuses treatment 3 consecutive times without medical reason, if treatment goals not met, if there is a change in medical status, if patient makes no progress towards goals or if patient is discharged from hospital.  The above assessment, treatment plan, treatment alternatives and goals were discussed  and mutually agreed upon: by patient and by family  Tonny Branch 06/11/2020, 8:52 AM

## 2020-06-11 NOTE — Evaluation (Signed)
Physical Therapy Assessment and Plan  Patient Details  Name: Taylor Castaneda MRN: [id number] Date of Birth: 1933-03-12  PT Diagnosis: Abnormality of gait, Difficulty walking, Hemiparesis non-dominant, Impaired cognition, Impaired sensation and Muscle weakness Rehab Potential: Good ELOS: ~7-10 days   Today's Date: 06/11/2020 PT Individual Time: 1305-1404 PT Individual Time Calculation (min): 59 min    Hospital Problem: Principal Problem:   Subcortical infarction [facility])   Past Medical History:  Past Medical History:  Diagnosis Date  . Anemia   . Diabetes mellitus, type II ([place])   . Glaucoma    Dr.Hecker  . History of colon cancer 1998   Dr. Lennie Hummer  . Hyperlipemia   . Osteopenia 02/2012, 03/2014   DEXA hip -2.2  . Renal insufficiency   . Seizures ([place])   . Stenosing tenosynovitis of finger of left hand 2016   index - s/p steroid injection x2   Past Surgical History:  Past Surgical History:  Procedure Laterality Date  . ABI  11/2013   WNL  . APPENDECTOMY    . BILIARY DILATION  05/31/2020   Procedure: BILIARY DILATION;  Surgeon: Jackquline Denmark, MD;  Location: [facility] ENDOSCOPY;  Service: Endoscopy;;  . BIOPSY  05/31/2020   Procedure: BIOPSY;  Surgeon: Jackquline Denmark, MD;  Location: [place];  Service: Endoscopy;;  . CARPAL TUNNEL RELEASE     Bilateral   . CATARACT EXTRACTION Bilateral 2006   Implants  . CHOLECYSTECTOMY N/A 06/01/2020   Procedure: LAPAROSCOPIC CHOLECYSTECTOMY;  Surgeon: Clovis Riley, MD;  Location: [place];  Service: General;  Laterality: N/A;  . COLECTOMY  1998   For cancer  . COLONOSCOPY  2008   Dr.Stark, [place] 2011  . COLONOSCOPY  06/2013   1 hyperplastic polyp, ileocecal anastomosis [facility] Plan)  . dexa  02/2012, 03/2014   T score -2.2 at hip overall stable  . ENDOSCOPIC RETROGRADE CHOLANGIOPANCREATOGRAPHY (ERCP) WITH PROPOFOL N/A 05/31/2020   Procedure: ENDOSCOPIC RETROGRADE CHOLANGIOPANCREATOGRAPHY (ERCP) WITH PROPOFOL;  Surgeon: Jackquline Denmark, MD;   Location[facility] END[facility]COPY;  Service: Endoscopy;  Laterality: N/A;  . REFRACTIVE SURGERY    . REMOVAL OF STONES  05/31/2020   Procedure: REMOVAL OF STONES;  Surgeon: Jackquline Denmark, MD;  Location[facility] ENDOSCOPY;  Service: Endoscopy;;  . [name]  05/31/2020   Procedure: [name];  Surgeon: Jackquline Denmark, MD;  Location: [facility] ENDOSCOPY;  Service: Endoscopy;;  . TONSILLECTOMY AND ADENOIDECTOMY    . TOTAL KNEE ARTHROPLASTY Left 12/17/2016   Procedure: LEFT TOTAL KNEE ARTHROPLASTY;  Surgeon: Gaynelle Arabian, MD;  Location: [facility] ORS;  Service: Orthopedics;  Laterality: Left;    Assessment & Plan Clinical Impression: Patient is a 87 y.o. year old right-handed female history of diabetes mellitus, hyperlipidemia, seizure disorder maintained on Keppra, history of colon cancer status post resection 1998 followed by Dr. Karie Chimera, glaucoma followed by Dr. Herbert Deaner and chronic anemia. Per chart review patient lives alone independent with assistive device. 1 level home 2 steps to entry. She does have good family support. [facility] being found down with complaints of abdominal pain right upper quadrant, epigastrium that radiated around to the ipsilateral scapula and shoulder. There was no nausea vomiting. There was no obvious trauma. There was some question of possible seizure. Cranial CT scan and CT cervical spine showed no evidence of acute abnormality. CT thoracic lumbar cervical spine again showing no acute osseous abnormality identified. There was severe stenosis at L4-5. Ultrasound the abdomen cholelithiasis without significant sonographic evidence of acute cholecystitis. MRI of the abdomen cholelithiasis but  again no acute cholecystitis small common bile duct stones distally no common bile duct dilation. Significant pancreatic atrophy. Admission chemistries WBC 17,800, hemoglobin 11.4, glucose 205 BUN 37 creatinine 1.40, lactic acid 4.0, troponin18, lipase 25, blood cultures no growth to  date. Patient underwent ERCP 05/31/2020 per Dr. Lyndel Safe showingcholedocholithiasisfound status post biliary sphincterotomy, sphincteroplasty and balloon extraction, cholelithiasis without cholecystitis no pus was found pancreatogramwas not obtained. General surgery consulted after ERCP completed underwent laparoscopic cholecystectomy 06/01/2020 per Dr. Windle Guard. Neurology consulted EEG negative for seizure. Echocardiogram ejection fraction of 60 to 65% no wall motion abnormalities grade 2 diastolic dysfunction. Patient with acute onset of aphasia again neurology follow-up 06/02/2020 MRI showing multiple punctate foci of acute ischemia within the right centrum semiovale. No hemorrhage or mass-effect. MRA no large vessel occlusion identified. Patient was cleared to begin aspirin 325 mg and Plavix for CVA prophylaxis x3 months then Plavix alone. Subcutaneous heparin for DVT prophylaxis. Tolerating a regular consistency diet. Due to patient's decreased functional mobility aphasia she was admitted for a comprehensive rehab program. Patient transferred to CIR on 06/10/2020 .   Patient currently requires min assist with mobility secondary to muscle weakness, decreased cardiorespiratoy endurance, impaired timing and sequencing, unbalanced muscle activation and decreased coordination,  , decreased problem solving, decreased memory and delayed processing and decreased standing balance, decreased postural control, hemiplegia and decreased balance strategies.  Prior to hospitalization, patient was independent  with mobility and lived with Alone in a Other(Comment) (2nd floor condo) .  Home access is Elevator.  Patient will benefit from skilled PT intervention to maximize safe functional mobility, minimize fall risk and decrease caregiver burden for planned discharge home with 24 hour supervision.  Anticipate patient will benefit from follow up OP at discharge.  PT - End of Session Activity Tolerance: Tolerates 30+ min  activity with multiple rests Endurance Deficit: Yes Endurance Deficit Description: becomes SOB quickly with limited activity requiring frequent seated rest breaks PT Assessment Rehab Potential (ACUTE/IP ONLY): Good PT Barriers to Discharge: Decreased caregiver support;Lack of/limited family support PT Patient demonstrates impairments in the following area(s): Balance;Perception;Behavior;Safety;Edema;Sensory;Endurance;Skin Integrity;Motor;Nutrition;Pain PT Transfers Functional Problem(s): Bed Mobility;Bed to Chair;Car;Furniture PT Locomotion Functional Problem(s): Ambulation;Stairs PT Plan PT Intensity: Minimum of 1-2 x/day ,45 to 90 minutes PT Frequency: 5 out of 7 days PT Duration Estimated Length of Stay: ~7-10 days PT Treatment/Interventions: Ambulation/gait training;Community reintegration;DME/adaptive equipment instruction;Neuromuscular re-education;Psychosocial support;Stair training;UE/LE Strength taining/ROM;Balance/vestibular training;Discharge planning;Functional electrical stimulation;Pain management;Skin care/wound management;Therapeutic Activities;UE/LE Coordination activities;Cognitive remediation/compensation;Disease management/prevention;Functional mobility training;Patient/family education;Therapeutic Exercise;Splinting/orthotics;Visual/perceptual remediation/compensation PT Transfers Anticipated Outcome(s): supervision using LRAD PT Locomotion Anticipated Outcome(s): supervision using LRAD PT Recommendation Follow Up Recommendations: Outpatient PT;24 hour supervision/assistance Patient destination: Home Equipment Recommended: To be determined;Other (comment) Equipment Details: has RW and SPC   PT Evaluation Precautions/Restrictions Precautions Precautions: Fall;Other (comment) Precaution Comments: monitor vitals with activity, hx of seizures Restrictions Weight Bearing Restrictions: No Pain Pain Assessment Pain Scale: 0-10 Pain Score: 0-No pain Home Living/Prior  Functioning Home Living Available Help at Discharge: Family;Available 24 hours/day (pt's daughter and sons will be working to arrange 24hr support) Type of Home: Other(Comment) (2nd floor condo) Home Access: Elevator Home Layout: One level Additional Comments: reports has RW from prior knee surgery; has shower chair/seat and BSC  Lives With: Alone Prior Function Level of Independence: Independent with homemaking with ambulation;Independent with gait;Independent with transfers Driving: No Vocation: Retired Comments: Not using AD in the home; 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 Perception: Within Functional Limits Praxis Praxis: Intact  Cognition  Overall Cognitive Status:  Impaired/Different from baseline Arousal/Alertness: Awake/alert Orientation Level: Oriented X4 Memory: Impaired Awareness: Appears intact Problem Solving: Impaired Safety/Judgment: Appears intact Sensation Sensation Light Touch: Impaired Detail Peripheral sensation comments: hx of peripheral neuropathy Light Touch Impaired Details: Impaired RLE;Impaired LLE Hot/Cold: Not tested Proprioception: Appears Intact Stereognosis: Not tested Coordination Gross Motor Movements are Fluid and Coordinated: No Coordination and Movement Description: gross motor impaired due to slight L hemiparesis and generalized weakness with impaired balance Heel Shin Test: symmetrical through small ROM; pt subjectively reports increased difficulty with L LE Motor  Motor Motor: Hemiplegia Motor - Skilled Clinical Observations: slight L hemiparesis, generalized weakness   Trunk/Postural Assessment  Cervical Assessment Cervical Assessment: Exceptions to [facility] (forward head) Thoracic Assessment Thoracic Assessment: Exceptions to [facility] (rounded shoulders) Lumbar Assessment Lumbar Assessment: Exceptions to [facility] (posterior pelvic tilt in sitting) Postural Control Postural Control: Deficits on evaluation Righting Reactions:  impaired Postural Limitations: decreased  Balance Balance Balance Assessed: Yes Static Sitting Balance Static Sitting - Balance Support: Feet supported Static Sitting - Level of Assistance: 5: Stand by assistance Dynamic Sitting Balance Dynamic Sitting - Balance Support: Feet supported Dynamic Sitting - Level of Assistance: 4: Min assist Static Standing Balance Static Standing - Balance Support: During functional activity;Left upper extremity supported Static Standing - Level of Assistance: 4: Min assist Dynamic Standing Balance Dynamic Standing - Balance Support: During functional activity;Left upper extremity supported Dynamic Standing - Level of Assistance: 4: Min assist;3: Mod assist Extremity Assessment   R UE Assessment Pt noted to have 4+ pitting edema to R forearm - Ed, RN made aware   RLE Assessment RLE Assessment: Exceptions to [facility] Active Range of Motion (AROM) Comments: WFL General Strength Comments: difficulty following commands to perform formal assessment RLE Strength Right Hip Flexion: 3+/5 Right Knee Flexion: 4-/5 Right Knee Extension: 4/5 Right Ankle Dorsiflexion: 4-/5 Right Ankle Plantar Flexion: 4-/5 LLE Assessment LLE Assessment: Exceptions to [facility] Active Range of Motion (AROM) Comments: WFL LLE Strength Left Hip Flexion: 3+/5 Left Knee Flexion: 4-/5 Left Knee Extension: 4-/5 Left Ankle Dorsiflexion: 3+/5 Left Ankle Plantar Flexion: 3+/5  Care Tool Care Tool Bed Mobility Roll left and right activity   Roll left and right assist level: Supervision/Verbal cueing    Sit to lying activity   Sit to lying assist level: Minimal Assistance - Patient > 75%    Lying to sitting edge of bed activity   Lying to sitting edge of bed assist level: Minimal Assistance - Patient > 75%     Care Tool Transfers Sit to stand transfer   Sit to stand assist level: Minimal Assistance - Patient > 75%    Chair/bed transfer   Chair/bed transfer assist level: Minimal  Assistance - Patient > 75%     Psychologist, counselling transfer activity did not occur: Safety/medical concerns (SOB limiting activity)        Care Tool Locomotion Ambulation   Assist level: Minimal Assistance - Patient > 75% Assistive device: Hand held assist Max distance: 70f  Walk 10 feet activity   Assist level: Minimal Assistance - Patient > 75% Assistive device: Hand held assist   Walk 50 feet with 2 turns activity   Assist level: Minimal Assistance - Patient > 75% Assistive device: Hand held assist  Walk 150 feet activity Walk 150 feet activity did not occur: Safety/medical concerns      Walk 10 feet on uneven surfaces activity Walk 10 feet on uneven surfaces activity did not  occur: Safety/medical concerns      Stairs   Assist level: Minimal Assistance - Patient > 75% Stairs assistive device: 2 hand rails Max number of stairs: 4  Walk up/down 1 step activity   Walk up/down 1 step (curb) assist level: Minimal Assistance - Patient > 75% Walk up/down 1 step or curb assistive device: 2 hand rails    Walk up/down 4 steps activity Walk up/down 4 steps assist level: Minimal Assistance - Patient > 75% Walk up/down 4 steps assistive device: 2 hand rails  Walk up/down 12 steps activity Walk up/down 12 steps activity did not occur: Safety/medical concerns      Pick up small objects from floor Pick up small object from the floor (from standing position) activity did not occur: Safety/medical concerns      Wheelchair Will patient use wheelchair at discharge?: No          Wheel 50 feet with 2 turns activity      Wheel 150 feet activity        Refer to Care Plan for [place] 1 PT Short Term Goal 1 (Week 1): = to LTGs based on ELOS  Recommendations for other services: None   Skilled Therapeutic Intervention Pt received supine in bed with her daughter, [name], present and pt agreeable to therapy session. Evaluation  completed (see details above) with patient education regarding purpose of PT evaluation, PT POC and goals, therapy schedule, weekly team meetings, and other CIR information including safety plan and fall risk safety. Performed supine>sitting L EOB, HOB flat and not using bedrails, with min assist for trunk upright. Sitting EOB donned B LE TED hose and shoes max assist for time management. Sit<>stands, no AD but using armrests when available, with varying CGA/min assist depending pt fatigue. R stand pivot EOB>w/c, no AD, with min assist for balance.  Transported to/from gym in w/c for time management and energy conservation. Assessed vitals: BP 141/50 (MAP 76), HR 77bpm, SpO2 99%. Pt becomes very SOB with limited activity throughout session requiring frequent seated rest breaks. Gait training ~23f, ~223fusing L HHA with min assist for balance - demos reciprocal stepping pattern with no significant gait deviations L compared R but overall slow gait speed with decreased step lengths and foot clearances. Reassessed vitals: BP 139/64 (MAP 87), HR 80bpm, SpO2 99%  Ascended/descended 4 steps using B HRs with pt self-selecting step-to pattern leading with R LE on ascent and L LE on descent with good safety awareness - min assist for balance. Pt becomes very SOB after this requiring prolonged seated rest break although SpO2 99%. Transported back to room and pt requests to sit in recliner. Short distance ~20f320fmbulatory transfer w/c>recliner using L HHA with min assist for balance. Pt left sitting in recliner with needs in reach and seat belt alarm on.  Mobility Bed Mobility Bed Mobility: Supine to Sit;Sit to Supine Supine to Sit: Minimal Assistance - Patient > 75% Sit to Supine: Minimal Assistance - Patient > 75% Transfers Transfers: Sit to Stand;Stand to Sit;Stand Pivot Transfers Sit to Stand: Minimal Assistance - Patient > 75% Stand to Sit: Minimal Assistance - Patient > 75% Stand Pivot Transfers: Minimal  Assistance - Patient > 75% Stand Pivot Transfer Details: Tactile cues for sequencing;Verbal cues for technique;Verbal cues for gait pattern;Verbal cues for sequencing;Visual cues/gestures for sequencing;Tactile cues for weight shifting;Verbal cues for precautions/safety Transfer (Assistive device): 1 person hand held assist Locomotion  Gait Ambulation: Yes Gait Assistance:  Minimal Assistance - Patient > 75% Gait Distance (Feet): 50 Feet Assistive device: 1 person hand held assist Gait Assistance Details: Tactile cues for posture;Verbal cues for gait pattern;Verbal cues for technique;Verbal cues for sequencing;Tactile cues for weight shifting Gait Gait: Yes Gait Pattern: Impaired Gait Pattern: Step-through pattern;Decreased step length - left;Decreased step length - right;Decreased stride length;Poor foot clearance - left;Poor foot clearance - right Gait velocity: decreased Stairs / Additional Locomotion Stairs: Yes Stairs Assistance: Minimal Assistance - Patient > 75% Stair Management Technique: Two rails;Step to pattern Number of Stairs: 4 Height of Stairs: 6 Wheelchair Mobility Wheelchair Mobility: No   Discharge Criteria: Patient will be discharged from PT if patient refuses treatment 3 consecutive times without medical reason, if treatment goals not met, if there is a change in medical status, if patient makes no progress towards goals or if patient is discharged from hospital.  The above assessment, treatment plan, treatment alternatives and goals were discussed and mutually agreed upon: by patient and by family  Tawana Scale , PT, DPT, CSRS  06/11/2020, 12:23 PM

## 2020-06-12 DIAGNOSIS — L8990 Pressure ulcer of unspecified site, unspecified stage: Secondary | ICD-10-CM | POA: Insufficient documentation

## 2020-06-12 DIAGNOSIS — L89620 Pressure ulcer of left heel, unstageable: Secondary | ICD-10-CM | POA: Insufficient documentation

## 2020-06-12 LAB — GLUCOSE, CAPILLARY
Glucose-Capillary: 123 mg/dL — ABNORMAL HIGH (ref 70–99)
Glucose-Capillary: 113 mg/dL — ABNORMAL HIGH (ref 70–99)
Glucose-Capillary: 230 mg/dL — ABNORMAL HIGH (ref 70–99)
Glucose-Capillary: 186 mg/dL — ABNORMAL HIGH (ref 70–99)

## 2020-06-12 MED ORDER — INSULIN GLARGINE 100 UNIT/ML ~~LOC~~ SOLN
10.0000 [IU] | Freq: Every day | SUBCUTANEOUS | Status: DC
  Administered 2020-06-12 – 2020-06-22 (×11): 10 [IU] via SUBCUTANEOUS
  Administered 2020-06-23: 5 [IU] via SUBCUTANEOUS
  Filled 2020-06-12 (×14): qty 0.1

## 2020-06-12 NOTE — Progress Notes (Signed)
[place] PHYSICAL MEDICINE & REHABILITATION PROGRESS NOTE   Subjective/Complaints: No problems this morning. Says that therapy went well. Denies headache, pain  ROS: Patient denies fever, rash, sore throat, blurred vision, nausea, vomiting, diarrhea, cough, shortness of breath or chest pain, joint or back pain, headache, or mood change.     Objective:   No results found. Recent Labs    06/10/20 1655  WBC 11.6*  HGB 8.6*  HCT 27.0*  PLT 191   Recent Labs    06/10/20 1655  CREATININE 1.07*    Intake/Output Summary (Last 24 hours) at 06/12/2020 1055 Last data filed at 06/12/2020 8325 Gross per 24 hour  Intake 40 ml  Output --  Net 40 ml     Pressure Injury 06/10/20 Coccyx Medial Stage 1 -  Intact skin with non-blanchable redness of a localized area usually over a bony prominence. (Active)  06/10/20 2200  Location: Coccyx  Location Orientation: Medial  Staging: Stage 1 -  Intact skin with non-blanchable redness of a localized area usually over a bony prominence.  Wound Description (Comments):   Present on Admission: Yes    Physical Exam: Vital Signs Blood pressure (!) 144/76, pulse 70, temperature 98.2 F (36.8 C), resp. rate 17, height 5' (1.524 m), weight 56.5 kg, SpO2 99 %.  Constitutional: No distress . Vital signs reviewed. HEENT: EOMI, oral membranes moist Neck: supple Cardiovascular: RRR without murmur. No JVD    Respiratory/Chest: CTA Bilaterally without wheezes or rales. Normal effort    GI/Abdomen: BS +, non-tender, non-distended Ext: no clubbing, cyanosis, or edema Psych: pleasant and cooperative Skin: right wrist wound with vaseline gauze and kerlix--drainage much improved Neuro: oriented x3, language more fluent by the day. Reasonable insight and awareness. RUE 4+/5 RLE 4+/5. LUE and LLE 4/5. No focal sensory deficits appreciated Musculoskeletal: Full ROM, No pain with AROM or PROM in the neck, trunk, or extremities. Posture appropriate        Assessment/Plan: 1. Functional deficits which require 3+ hours per day of interdisciplinary therapy in a comprehensive inpatient rehab setting.  Physiatrist is providing close team supervision and 24 hour management of active medical problems listed below.  Physiatrist and rehab team continue to assess barriers to discharge/monitor patient progress toward functional and medical goals  Care Tool:  Bathing    Body parts bathed by patient: Right arm,Left arm,Chest,Abdomen,Front perineal area,Right upper leg,Left upper leg,Face   Body parts bathed by helper: Buttocks,Right lower leg,Left lower leg     Bathing assist Assist Level: Moderate Assistance - Patient 50 - 74%     Upper Body Dressing/Undressing Upper body dressing Upper body dressing/undressing activity did not occur (including orthotics): Safety/medical concerns What is the patient wearing?: Pull over shirt    Upper body assist Assist Level: Moderate Assistance - Patient 50 - 74%    Lower Body Dressing/Undressing Lower body dressing      What is the patient wearing?: Underwear/pull up,Pants     Lower body assist Assist for lower body dressing: Moderate Assistance - Patient 50 - 74%     Toileting Toileting    Toileting assist Assist for toileting: Moderate Assistance - Patient 50 - 74%     Transfers Chair/bed transfer  Transfers assist     Chair/bed transfer assist level: Minimal Assistance - Patient > 75%     Locomotion Ambulation   Ambulation assist      Assist level: Minimal Assistance - Patient > 75% Assistive device: Hand held assist Max distance: 72ft  Walk 10 feet activity   Assist     Assist level: Minimal Assistance - Patient > 75% Assistive device: Hand held assist   Walk 50 feet activity   Assist    Assist level: Minimal Assistance - Patient > 75% Assistive device: Hand held assist    Walk 150 feet activity   Assist Walk 150 feet activity did not occur:  Safety/medical concerns         Walk 10 feet on uneven surface  activity   Assist Walk 10 feet on uneven surfaces activity did not occur: Safety/medical concerns         Wheelchair     Assist Will patient use wheelchair at discharge?: No             Wheelchair 50 feet with 2 turns activity    Assist            Wheelchair 150 feet activity     Assist          Blood pressure (!) 144/76, pulse 70, temperature 98.2 F (36.8 C), resp. rate 17, height 5' (1.524 m), weight 56.5 kg, SpO2 99 %.  Medical Problem List and Plan: 1.Decreased functional ability with aphasiasecondary to several punctate right centrum semiovale infarct likely due to severe intracranial stenosis -patient may Shower with right wrist covered. -ELOS/Goals: 7-10 days, Mod I with PT, OT and min assist with SLP  -continue therapies 2. Antithrombotics: -DVT/anticoagulation:Subcutaneous heparin -antiplatelet therapy: Aspirin 325 mg daily and Plavix 75 mg daily x3 months then Plavix alone 3. Pain Management:Tramadol as needed 4. Mood:Provide emotional support -antipsychotic agents: N/A 5. Neuropsych: This patientiscapable of making decisions on herown behalf. 6. Skin/Wound Care:Routine skin checks 7. Fluids/Electrolytes/Nutrition:Routine in and outs with follow-up chemistries 8. Cholelithiasis/choledocholithiasis. Status post ERCP followed by laparoscopic cholecystectomy 06/01/2020 -pt still denies pain 9. Seizure disorder. Keppra 500 mg twice daily. EEG negative 10. Hypertension. Altace 5 mg daily, Coreg 12.5 mg twice daily. -1/9 SBP has been slightly labile but want to avoid hypoperfusion---continue to monitor for consistent pattern 11. Diabetes mellitus. Hemoglobin A1c 8.7. Lantus insulin 8 units nightly---pt takes 16u at bedtime at home -1/9 cbg's sl elevated. Will increase to 10u  tonight 12. Hyperlipidemia. Lipitor 13. History of colon cancer with resection 1998. Follow-up as outpatient    LOS: 2 days A FACE TO FACE EVALUATION WAS PERFORMED  Meredith Staggers 06/12/2020, 10:55 AM

## 2020-06-13 ENCOUNTER — Inpatient Hospital Stay (HOSPITAL_COMMUNITY): Payer: Medicare Other | Admitting: Occupational Therapy

## 2020-06-13 ENCOUNTER — Inpatient Hospital Stay (HOSPITAL_COMMUNITY): Payer: Medicare Other

## 2020-06-13 ENCOUNTER — Inpatient Hospital Stay (HOSPITAL_COMMUNITY): Payer: Medicare Other | Admitting: Speech Pathology

## 2020-06-13 LAB — COMPREHENSIVE METABOLIC PANEL
ALT: 22 U/L (ref 0–44)
AST: 21 U/L (ref 15–41)
Albumin: 2.3 g/dL — ABNORMAL LOW (ref 3.5–5.0)
Alkaline Phosphatase: 156 U/L — ABNORMAL HIGH (ref 38–126)
Anion gap: 11 (ref 5–15)
BUN: 20 mg/dL (ref 8–23)
CO2: 20 mmol/L — ABNORMAL LOW (ref 22–32)
Calcium: 8.5 mg/dL — ABNORMAL LOW (ref 8.9–10.3)
Chloride: 108 mmol/L (ref 98–111)
Creatinine, Ser: 1.04 mg/dL — ABNORMAL HIGH (ref 0.44–1.00)
GFR, Estimated: 53 mL/min — ABNORMAL LOW (ref >60)
Glucose, Bld: 156 mg/dL — ABNORMAL HIGH (ref 70–99)
Potassium: 4.4 mmol/L (ref 3.5–5.1)
Sodium: 139 mmol/L (ref 135–145)
Total Bilirubin: 0.7 mg/dL (ref 0.3–1.2)
Total Protein: 5.3 g/dL — ABNORMAL LOW (ref 6.5–8.1)

## 2020-06-13 LAB — GLUCOSE, CAPILLARY
Glucose-Capillary: 130 mg/dL — ABNORMAL HIGH (ref 70–99)
Glucose-Capillary: 187 mg/dL — ABNORMAL HIGH (ref 70–99)
Glucose-Capillary: 126 mg/dL — ABNORMAL HIGH (ref 70–99)
Glucose-Capillary: 196 mg/dL — ABNORMAL HIGH (ref 70–99)
Glucose-Capillary: 161 mg/dL — ABNORMAL HIGH (ref 70–99)

## 2020-06-13 LAB — CBC WITH DIFFERENTIAL/PLATELET
Abs Immature Granulocytes: 0.12 10*3/uL — ABNORMAL HIGH (ref 0.00–0.07)
Basophils Absolute: 0.1 10*3/uL (ref 0.0–0.1)
Basophils Relative: 1 %
Eosinophils Absolute: 0.3 10*3/uL (ref 0.0–0.5)
Eosinophils Relative: 2 %
HCT: 23.9 % — ABNORMAL LOW (ref 36.0–46.0)
Hemoglobin: 7.7 g/dL — ABNORMAL LOW (ref 12.0–15.0)
Immature Granulocytes: 1 %
Lymphocytes Relative: 17 %
Lymphs Abs: 2.3 10*3/uL (ref 0.7–4.0)
MCH: 33.2 pg (ref 26.0–34.0)
MCHC: 32.2 g/dL (ref 30.0–36.0)
MCV: 103 fL — ABNORMAL HIGH (ref 80.0–100.0)
Monocytes Absolute: 1.4 10*3/uL — ABNORMAL HIGH (ref 0.1–1.0)
Monocytes Relative: 11 %
Neutro Abs: 9.3 10*3/uL — ABNORMAL HIGH (ref 1.7–7.7)
Neutrophils Relative %: 68 %
Platelets: 173 10*3/uL (ref 150–400)
RBC: 2.32 MIL/uL — ABNORMAL LOW (ref 3.87–5.11)
RDW: 14.9 % (ref 11.5–15.5)
WBC: 13.5 10*3/uL — ABNORMAL HIGH (ref 4.0–10.5)
nRBC: 0 % (ref 0.0–0.2)

## 2020-06-13 MED ORDER — DICLOFENAC SODIUM 1 % EX GEL
2.0000 g | Freq: Four times a day (QID) | CUTANEOUS | Status: DC
  Administered 2020-06-13 – 2020-06-24 (×31): 2 g via TOPICAL
  Filled 2020-06-13: qty 100

## 2020-06-13 MED ORDER — ENOXAPARIN SODIUM 30 MG/0.3ML ~~LOC~~ SOLN
30.0000 mg | SUBCUTANEOUS | Status: DC
  Administered 2020-06-13: 30 mg via SUBCUTANEOUS
  Filled 2020-06-13: qty 0.3

## 2020-06-13 NOTE — Progress Notes (Addendum)
[place] PHYSICAL MEDICINE & REHABILITATION PROGRESS NOTE   Subjective/Complaints:  Discussed anemia, prior functional level (Mod I).  Has SOB when OOB, denied problems with this at home Has not noted blood in stool + RIght knee pain just the last couple days no recent fall other than at home PTA ~2wks ago Per son was on fluid pill at home   ROS: Patient denies CP, +SOB when getting OOB, no N/V/D   Objective:   No results found. Recent Labs    06/10/20 1655 06/13/20 0537  WBC 11.6* 13.5*  HGB 8.6* 7.7*  HCT 27.0* 23.9*  PLT 191 173   Recent Labs    06/10/20 1655 06/13/20 0537  NA  --  139  K  --  4.4  CL  --  108  CO2  --  20*  GLUCOSE  --  156*  BUN  --  20  CREATININE 1.07* 1.04*  CALCIUM  --  8.5*    Intake/Output Summary (Last 24 hours) at 06/13/2020 0754 Last data filed at 06/12/2020 1930 Gross per 24 hour  Intake 340 ml  Output -  Net 340 ml     Pressure Injury 06/10/20 Coccyx Medial Stage 1 -  Intact skin with non-blanchable redness of a localized area usually over a bony prominence. (Active)  06/10/20 2200  Location: Coccyx  Location Orientation: Medial  Staging: Stage 1 -  Intact skin with non-blanchable redness of a localized area usually over a bony prominence.  Wound Description (Comments):   Present on Admission: Yes    Physical Exam: Vital Signs Blood pressure (!) 140/55, pulse 77, temperature 98.3 F (36.8 C), temperature source Oral, resp. rate 16, height 5' (1.524 m), weight 56.5 kg, SpO2 98 %.  General: No acute distress Mood and affect are appropriate Heart: Regular rate and rhythm no rubs murmurs or extra sounds Lungs: Clear to auscultation, breathing unlabored, no rales or wheezes Abdomen: Positive bowel sounds, soft nontender to palpation, nondistended Extremities: No clubbing, cyanosis, or edema Skin: No evidence of breakdown, no evidence of rash   Skin: right wrist wound with vaseline gauze and kerlix--drainage much  improved Neuro: oriented x3, language more fluent by the day. Reasonable insight and awareness. RUE 4+/5 RLE 4+/5. LUE and LLE 4/5. No focal sensory deficits appreciated Musculoskeletal: Full ROM, No pain with AROM or PROM in the neck, trunk, or extremities. Posture appropriate       Assessment/Plan: 1. Functional deficits which require 3+ hours per day of interdisciplinary therapy in a comprehensive inpatient rehab setting.  Physiatrist is providing close team supervision and 24 hour management of active medical problems listed below.  Physiatrist and rehab team continue to assess barriers to discharge/monitor patient progress toward functional and medical goals  Care Tool:  Bathing    Body parts bathed by patient: Right arm,Left arm,Chest,Abdomen,Front perineal area,Right upper leg,Left upper leg,Face   Body parts bathed by helper: Buttocks,Right lower leg,Left lower leg     Bathing assist Assist Level: Moderate Assistance - Patient 50 - 74%     Upper Body Dressing/Undressing Upper body dressing Upper body dressing/undressing activity did not occur (including orthotics): Safety/medical concerns What is the patient wearing?: Pull over shirt    Upper body assist Assist Level: Moderate Assistance - Patient 50 - 74%    Lower Body Dressing/Undressing Lower body dressing      What is the patient wearing?: Pants,Underwear/pull up     Lower body assist Assist for lower body dressing: Minimal Assistance -  Patient > 75%     Chartered loss adjuster assist Assist for toileting: Minimal Assistance - Patient > 75%     Transfers Chair/bed transfer  Transfers assist     Chair/bed transfer assist level: Minimal Assistance - Patient > 75%     Locomotion Ambulation   Ambulation assist      Assist level: Minimal Assistance - Patient > 75% Assistive device: Hand held assist Max distance: 20ft   Walk 10 feet activity   Assist     Assist level: Minimal  Assistance - Patient > 75% Assistive device: Hand held assist   Walk 50 feet activity   Assist    Assist level: Minimal Assistance - Patient > 75% Assistive device: Hand held assist    Walk 150 feet activity   Assist Walk 150 feet activity did not occur: Safety/medical concerns         Walk 10 feet on uneven surface  activity   Assist Walk 10 feet on uneven surfaces activity did not occur: Safety/medical concerns         Wheelchair     Assist Will patient use wheelchair at discharge?: No             Wheelchair 50 feet with 2 turns activity    Assist            Wheelchair 150 feet activity     Assist          Blood pressure (!) 140/55, pulse 77, temperature 98.3 F (36.8 C), temperature source Oral, resp. rate 16, height 5' (1.524 m), weight 56.5 kg, SpO2 98 %.  Medical Problem List and Plan: 1.Decreased functional ability with aphasiasecondary to several punctate right centrum semiovale infarct likely due to severe intracranial stenosis -patient may Shower with right wrist covered. -ELOS/Goals: 7-10 days, Mod I with PT, OT and min assist with SLP  -continue therapies 2. Antithrombotics: -DVT/anticoagulation:Subcutaneous heparin -antiplatelet therapy: Aspirin 325 mg daily and Plavix 75 mg daily x3 months then Plavix alone 3. Pain Management:Tramadol as needed 4. Mood:Provide emotional support -antipsychotic agents: N/A 5. Neuropsych: This patientiscapable of making decisions on herown behalf. 6. Skin/Wound Care:Routine skin checks 7. Fluids/Electrolytes/Nutrition:Routine in and outs with follow-up chemistries 8. Cholelithiasis/choledocholithiasis. Status post ERCP followed by laparoscopic cholecystectomy 06/01/2020 -pt still denies pain 9. Seizure disorder. Keppra 500 mg twice daily. EEG negative 10. Hypertension. Altace 5 mg daily, Coreg 12.5 mg twice  daily. -1/9 SBP has been slightly labile but want to avoid hypoperfusion--- Vitals:   06/12/20 2025 06/13/20 0514  BP: (!) 113/53 (!) 140/55  Pulse: 71 77  Resp: 16 16  Temp: 97.7 F (36.5 C) 98.3 F (36.8 C)  SpO2: 99% 98%  In desired range this am  11. Diabetes mellitus. Hemoglobin A1c 8.7. Lantus insulin 8 units nightly---pt takes 16u at bedtime at home  CBG (last 3)  Recent Labs    06/12/20 1644 06/12/20 2105 06/13/20 0610  GLUCAP 230* 186* 130*  good control this am on lantus 10U   12. Hyperlipidemia. Lipitor 13. History of colon cancer with resection 1998. Follow-up as outpatient  14.  Leukocytosis no fever monitor for now  15.  DOE- likely deconditioning plus anemia, will monitor in therapy , check CXR to look for pulm edema pulm HTN , off lasix LOS: 3 days A FACE TO FACE EVALUATION WAS PERFORMED  Charlett Blake 06/13/2020, 7:54 AM

## 2020-06-13 NOTE — IPOC Note (Signed)
Overall Plan of Care [facility]) Patient Details Name: Taylor Castaneda MRN: [id number] DOB: 30-Dec-1932  Admitting Diagnosis: Subcortical infarction [facility])  Hospital Problems: Principal Problem:   Subcortical infarction ([place]) Active Problems:   Pressure injury of skin     Functional Problem List: Nursing Endurance  PT Balance,Perception,Behavior,Safety,Edema,Sensory,Endurance,Skin Integrity,Motor,Nutrition,Pain  OT Balance,Cognition,Endurance,Motor,Pain,Safety  SLP Cognition  TR         Basic ADL's: OT Grooming,Bathing,Dressing,Toileting     Advanced  ADL's: OT Simple Meal Preparation     Transfers: PT Bed Mobility,Bed to Chair,Car,Furniture  OT Toilet,Tub/Shower     Locomotion: PT Ambulation,Stairs     Additional Impairments: OT    SLP        TR      Anticipated Outcomes Item Anticipated Outcome  Self Feeding S  Swallowing      Basic self-care  S  Toileting  S   Bathroom Transfers S  Bowel/Bladder  Pt will maintain bowel/bladder continence during hospital stay.  Transfers  supervision using LRAD  Locomotion  supervision using LRAD  Communication  supervision for expressive/receptive language tasks  Cognition  min A for basic cognition related to ADLs  Pain  Pt will maintain good pain level during hospital stay  Safety/Judgment  Pt will maintain safety/judgement awareness during hospital stay.   Therapy Plan: PT Intensity: Minimum of 1-2 x/day ,45 to 90 minutes PT Frequency: 5 out of 7 days PT Duration Estimated Length of Stay: ~7-10 days OT Intensity: Minimum of 1-2 x/day, 45 to 90 minutes OT Frequency: 5 out of 7 days OT Duration/Estimated Length of Stay: 7-10 SLP Intensity: Minumum of 1-2 x/day, 30 to 90 minutes SLP Frequency: 3 to 5 out of 7 days SLP Duration/Estimated Length of Stay: 7-10 days   Due to the current state of emergency, patients may not be receiving their 3-hours of Medicare-mandated therapy.   Team Interventions: Nursing  Interventions Patient/Family Education,Discharge Planning  PT interventions Ambulation/gait training,Community reintegration,DME/adaptive equipment instruction,Neuromuscular re-education,Psychosocial support,Stair training,UE/LE Strength taining/ROM,Balance/vestibular training,Discharge planning,Functional electrical stimulation,Pain management,Skin care/wound management,Therapeutic Activities,UE/LE Coordination activities,Cognitive remediation/compensation,Disease management/prevention,Functional mobility training,Patient/family education,Therapeutic Exercise,Splinting/orthotics,Visual/perceptual remediation/compensation  OT Interventions Balance/vestibular training,Discharge planning,Pain management,Self Care/advanced ADL retraining,Therapeutic Activities,UE/LE Coordination activities,Therapeutic Exercise,Skin care/wound managment,Patient/family education,Functional mobility training,Disease mangement/prevention,Cognitive remediation/compensation,Community Corporate treasurer re-education,Psychosocial support,Splinting/orthotics,UE/LE Strength taining/ROM,Wheelchair propulsion/positioning  SLP Interventions Cognitive remediation/compensation,Cueing hierarchy,Functional tasks,Therapeutic Exercise,Therapeutic Activities,Patient/family education  TR Interventions    SW/CM Interventions     Barriers to Discharge MD  Medical stability  Nursing      PT Decreased caregiver support,Lack of/limited family support    OT Decreased caregiver support,Lack of/limited family support    SLP      SW       Team Discharge Planning: Destination: PT-Home ,OT- Home , SLP-Home Projected Follow-up: PT-Outpatient PT,24 hour supervision/assistance, OT-  Home health OT, SLP-Home Health SLP Projected Equipment Needs: PT-To be determined,Other (comment), OT-  , SLP-To be determined Equipment Details: PT-has RW and SPC, OT-Pt has shower chair and BSC Patient/family involved in  discharge planning: PT- Diplomatic Services operational officer,  OT-Patient,Family Midwife, SLP-Patient,Family member/caregiver  MD ELOS: 7-10d Medical Rehab Prognosis:  Good Assessment:  87 year old right-handed female history of diabetes mellitus, hyperlipidemia, seizure disorder maintained on Keppra, history of colon cancer status post resection 1998 followed by Dr. Karie Castaneda, glaucoma followed by Dr. Herbert Castaneda and chronic anemia. Per chart review patient lives alone independent with assistive device. 1 level home 2 steps to entry. She does have good family support. [facility] being found down with complaints of abdominal pain right upper quadrant, epigastrium  that radiated around to the ipsilateral scapula and shoulder. There was no nausea vomiting. There was no obvious trauma. There was some question of possible seizure. Cranial CT scan and CT cervical spine showed no evidence of acute abnormality. CT thoracic lumbar cervical spine again showing no acute osseous abnormality identified. There was severe stenosis at L4-5. Ultrasound the abdomen cholelithiasis without significant sonographic evidence of acute cholecystitis. MRI of the abdomen cholelithiasis but again no acute cholecystitis small common bile duct stones distally no common bile duct dilation. Significant pancreatic atrophy. Admission chemistries WBC 17,800, hemoglobin 11.4, glucose 205 BUN 37 creatinine 1.40, lactic acid 4.0, troponin18, lipase 25, blood cultures no growth to date. Patient underwent ERCP 05/31/2020 per Dr. Lyndel Safe showingcholedocholithiasisfound status post biliary sphincterotomy, sphincteroplasty and balloon extraction, cholelithiasis without cholecystitis no pus was found pancreatogramwas not obtained. General surgery consulted after ERCP completed underwent laparoscopic cholecystectomy 06/01/2020 per Dr. Windle Guard. Neurology consulted EEG negative for seizure. Echocardiogram ejection fraction of 60 to 65%  no wall motion abnormalities grade 2 diastolic dysfunction. Patient with acute onset of aphasia again neurology follow-up 06/02/2020 MRI showing multiple punctate foci of acute ischemia within the right centrum semiovale. No hemorrhage or mass-effect. MRA no large vessel occlusion identified. Patient was cleared to begin aspirin 325 mg and Plavix for CVA prophylaxis x3 months then Plavix alone. Subcutaneous heparin for DVT prophylaxis. Tolerating a regular consistency diet. Due to patient's decreased functional mobility aphasia she was admitted for a comprehensive rehab program.   Now requiring 24/7 Rehab RN,MD, as well as CIR level PT, OT and SLP.  Treatment team will focus on ADLs and mobility with goals set at Sup/Mod I  See Team Conference Notes for weekly updates to the plan of care

## 2020-06-13 NOTE — Progress Notes (Signed)
Occupational Therapy Session Note  Patient Details  Name: Taylor Castaneda MRN: [id number] Date of Birth: [birth year]  Today's Date: 06/13/2020 OT Individual Time: 1035-1105 OT Individual Time Calculation (min): 30 min    Short Term Goals: Week 1:  OT Short Term Goal 1 (Week 1): STG=LTG d/t ELOS  Skilled Therapeutic Interventions/Progress Updates:    Pt received in bed with her son present. They discussed that she went for an xray to assess her R knee. It is quite swollen compared to the L and she stated it has been painful to put weight on it.   Pt agreeable to trying some light activity with washing UB, changing clothing.  From EOB pt needed min with UB and mod to max with LB dressing due to difficulty reaching to feet and bending knees.   Pt able to stand for short periods of time to get pants pulled up but after just 2 minutes, she became SOB and needed to sit and rest for 2-3 minutes. Stand pivot with RW with min to wc to brush teeth at sink and then to recliner.   Pt set up in recliner with all needs met. Son present. NT to obtain chair alarm for her.   Therapy Documentation Precautions:  Precautions Precautions: Fall,Other (comment) Precaution Comments: monitor vitals with activity, hx of seizures Restrictions Weight Bearing Restrictions: No    Vital Signs: Therapy Vitals Temp: 99.1 F (37.3 C) Temp Source: Oral Pulse Rate: 94 Resp: (!) 22 BP: 136/74 Patient Position (if appropriate): Standing Oxygen Therapy O2 Device: Room Air Pain: Pain Assessment Pain Scale: Faces Pain Score: 6  Faces Pain Scale: No hurt Pain Type: Acute pain Pain Location: Knee Pain Orientation: Right Pain Descriptors / Indicators: Aching Pain Onset: With Activity Pain Intervention(s): Repositioned;Rest ADL: ADL Eating: Supervision/safety Where Assessed-Eating: Bed level Grooming: Minimal assistance Where Assessed-Grooming: Edge of bed Upper Body Bathing: Supervision/safety Where  Assessed-Upper Body Bathing: Shower Lower Body Bathing: Moderate assistance Where Assessed-Lower Body Bathing: Shower Upper Body Dressing: Minimal assistance Where Assessed-Upper Body Dressing: Edge of bed Lower Body Dressing: Maximal assistance Where Assessed-Lower Body Dressing: Edge of bed Toileting: Moderate assistance Where Assessed-Toileting: Glass blower/designer: Midwife: Energy manager: Environmental education officer Method: Ambulating   Therapy/Group: Individual Therapy  Ramirez-Perez 06/13/2020, 1:28 PM

## 2020-06-13 NOTE — Progress Notes (Signed)
Physical Therapy Session Note  Patient Details  Name: Taylor Castaneda MRN: [id number] Date of Birth: 09/24/1932  Today's Date: 06/13/2020 PT Individual Time: 1300-1400 PT Individual Time Calculation (min): 60 min   Short Term Goals: Week 1:  PT Short Term Goal 1 (Week 1): = to LTGs based on ELOS  Skilled Therapeutic Interventions/Progress Updates:     Pt received sitting in recliner with son, [name], at bedside. Pt reports onset of R knee pain over the weekend that has limited her functional mobility. Team is aware and imaging has been ordered; chart reviewed and imaging negative for acute fx, does show both soft tissue swelling and joint effusion. Pain occurs primarily with weight bearing and she reports she hasn't experienced this type of pain before. RN present for medication and cream for R knee pain.   Sit<>stand with modA from recliner height with no AD, excessive forward flexion at the trunk. She took a few steps with minA and no AD but antalgic gait impacted her ability to safety ambulate. Provided her with RW which she ambulated ~50ft to her w/c with minA, continues to show antalgic gait with decreased R weight shift but improved steadiness. W/c transport to main therapy gym for energy conservation.  Performed a few simple LAQ on RLE to promote circulation and blood flow for tissue healing. She show's quite a bit of shortness of breath after doing this, pulse ox used to measure HR/oxygen which was reading 77bpm and 100% spO2. She required extended rest break for recovery.  She then performed sit<>stand with minA and RW from lowered mat table height, performed lateral stepping L<>R with minA and RW. Show's limited R knee flexion during stepping with compensatory hip hiking. She again shows S.O.B requires extended rest break for recovery.  She completed TUG, requiring CGA/minA and RW, scoring 63 seconds for completion. Time for completion limited by time to rise as well as slow turning.  Vitals assessed due to S.O.B, reading 85 HR and 98% SpO2.   Completed car transfer with car set to low height due to her short stature (pt is 43ft tall). She was unsure of which vehicle she would be going home in but it sounds like a ?large SUV. She required minA for performing the transfer and she was able to manage BLE's without assist needed. She again needed a lengthy seated rest break due to her shortness of breath with vitals at 100% spO2 and 87HR.   She was returned to her room with [place] in her w/c for time management, stand<>pivot with minA from w/c to recliner. She ended session seated in recliner with needs in reach and chair alarm on. Provided her with ice pack for R knee edema and educated her on precautions with cryotherapy, she voiced understanding.  Therapy Documentation Precautions:  Precautions Precautions: Fall,Other (comment) Precaution Comments: monitor vitals with activity, hx of seizures Restrictions Weight Bearing Restrictions: No  Therapy/Group: Individual Therapy  Sears Oran P Swade Shonka PT 06/13/2020, 1:23 PM

## 2020-06-13 NOTE — Progress Notes (Signed)
Speech Language Pathology Daily Session Note  Patient Details  Name: Taylor Castaneda MRN: [id number] Date of Birth: 1932-12-25  Today's Date: 06/13/2020 SLP Individual Time: 8882-8003 SLP Individual Time Calculation (min): 55 min  Short Term Goals: Week 1: SLP Short Term Goal 1 (Week 1): Pt will follow basic 2-3 step directions with 80% accuracy provided Min A verbal/visual cues. SLP Short Term Goal 2 (Week 1): Pt will demonstrate ability to use word-finding strategies in functional conversation and other targeted language tasks with Supervision A cues. SLP Short Term Goal 3 (Week 1): Pt will demonstrate ability to problem solve basic familiar functional situations with Min A verbal/visual cueing. SLP Short Term Goal 4 (Week 1): Pt will increase short term recall of functional information with use of compensatory strategies to 70% acc with min A verbal cues  Skilled Therapeutic Interventions: Pt was seen for skilled ST targeting cognitive-linguistic goals. Pt's son was present and appropriately  engaged at bedside during session. SLP facilitated session with opportunities to target short term memory and word-finding goals. Pt with only 1 instance of mild word finding and slight dysfluency in conversation with SLP regarding home life. During structured naming tasks, she required Min A semantic and phonemic cues for identifying synonyms and antonyms. In functional conversation regarding CIR, she required Max A to recall differentiation between therapy disciplines and safety precautions with use of wheelchair when verbally sequencing steps of a transfer from bed to wheelchair. Pt left sitting upright in bed with alarm set and needs within reach, son still present. Continue per current plan of care.      Pain Pain Assessment Pain Scale: Faces Faces Pain Scale: No hurt  Therapy/Group: Individual Therapy  Arbutus Leas 06/13/2020, 7:25 AM

## 2020-06-13 NOTE — Progress Notes (Signed)
Inpatient Rehabilitation Care Coordinator Assessment and Plan Patient Details  Name: Taylor Castaneda MRN: [id number] Date of Birth: [birth year]  Today's Date: 06/13/2020  Hospital Problems: Principal Problem:   Subcortical infarction [facility]) Active Problems:   Pressure injury of skin  Past Medical History:  Past Medical History:  Diagnosis Date  . Anemia   . Diabetes mellitus, type II ([place])   . Glaucoma    Dr.Hecker  . History of colon cancer 1998   Dr. Lennie Hummer  . Hyperlipemia   . Osteopenia 02/2012, 03/2014   DEXA hip -2.2  . Renal insufficiency   . Seizures ([place])   . Stenosing tenosynovitis of finger of left hand 2016   index - s/p steroid injection x2   Past Surgical History:  Past Surgical History:  Procedure Laterality Date  . ABI  11/2013   WNL  . APPENDECTOMY    . BILIARY DILATION  05/31/2020   Procedure: BILIARY DILATION;  Surgeon: Jackquline Denmark, MD;  Location: [facility] ENDOSCOPY;  Service: Endoscopy;;  . BIOPSY  05/31/2020   Procedure: BIOPSY;  Surgeon: Jackquline Denmark, MD;  Location: [place];  Service: Endoscopy;;  . CARPAL TUNNEL RELEASE     Bilateral   . CATARACT EXTRACTION Bilateral 2006   Implants  . CHOLECYSTECTOMY N/A 06/01/2020   Procedure: LAPAROSCOPIC CHOLECYSTECTOMY;  Surgeon: Clovis Riley, MD;  Location: [place];  Service: General;  Laterality: N/A;  . COLECTOMY  1998   For cancer  . COLONOSCOPY  2008   Dr.Stark, Due 2011  . COLONOSCOPY  06/2013   1 hyperplastic polyp, ileocecal anastomosis [facility])  . dexa  02/2012, 03/2014   T score -2.2 at hip overall stable  . ENDOSCOPIC RETROGRADE CHOLANGIOPANCREATOGRAPHY (ERCP) WITH PROPOFOL N/A 05/31/2020   Procedure: ENDOSCOPIC RETROGRADE CHOLANGIOPANCREATOGRAPHY (ERCP) WITH PROPOFOL;  Surgeon: Jackquline Denmark, MD;  Location: [facility] ENDOSCOPY;  Service: Endoscopy;  Laterality: N/A;  . REFRACTIVE SURGERY    . REMOVAL OF STONES  05/31/2020   Procedure: REMOVAL OF STONES;  Surgeon: Jackquline Denmark, MD;  Location: [facility]  ENDOSCOPY;  Service: Endoscopy;;  . [name]  05/31/2020   Procedure: [name];  Surgeon: Jackquline Denmark, MD;  Location: [facility] ENDOSCOPY;  Service: Endoscopy;;  . TONSILLECTOMY AND ADENOIDECTOMY    . TOTAL KNEE ARTHROPLASTY Left 12/17/2016   Procedure: LEFT TOTAL KNEE ARTHROPLASTY;  Surgeon: Gaynelle Arabian, MD;  Location: [facility] ORS;  Service: Orthopedics;  Laterality: Left;   Social History:  reports that she quit smoking about 47 years ago. Her smoking use included cigarettes. She quit after 2.00 years of use. She has never used smokeless tobacco. She reports previous alcohol use. She reports that she does not use drugs.  Family / Support Systems Marital Status: Widow/Widower Patient Roles: Parent ChildrenKandace Parkins [phone]  [name]-daughter [phone] Other Supports: Two other children Anticipated Caregiver: Four children Ability/Limitations of Caregiver: All children work and will try to work on a plan for [employer] not have 24/7 care Caregiver Availability: Intermittent Family Dynamics: Close with all of her four children who are local and involved. They all come by and check on her but can not provide 24/7 care at discharge. Pt wants too be mod/i at discharge.  Social History Preferred language: English Religion: Presbyterian Cultural Background: No issues Education: HS Read: Yes Write: Yes Employment Status: Retired Public relations account executive Issues: No issues Guardian/Conservator: None-according to MD pt is capable of making her own decisions while here.   Abuse/Neglect Abuse/Neglect Assessment Can Be Completed: Yes Physical Abuse: Denies Verbal Abuse: Denies Sexual  Abuse: Denies Exploitation of patient/patient's resources: Denies Self-Neglect: Denies  Emotional Status Pt's affect, behavior and adjustment status: Pt is motivated to do well here and wants to be safe home alone due to she will be there at discharge. She was doing well prior to CVA and hopes to recover from  this while here. Recent Psychosocial Issues: other health issues Psychiatric History: No history deferred depression screening due to coping appropriately and able to express her concerns and feelings. She does not want ot burden her children so will work hard while here Substance Abuse History: No issues  Patient / Family Perceptions, Expectations & Goals Pt/Family understanding of illness & functional limitations: Pt and son can explain her stroke and deficits as result. She has spoken with the MD and feels she has a good understanding of her condition and treatment plan moving forward. Son is here and providing support. Premorbid pt/family roles/activities: Mom, grandmother, church member, retiree, Social research officer, government Anticipated changes in roles/activities/participation: resume Pt/family expectations/goals: Pt states: " I want to be mobile with my walker like I was before this."  Son states: " I hope she can be safe to be home alone since we can't be there with her."  [employer]: None Premorbid Home Care/DME Agencies: Other (Comment) (has rw and cane) Transportation available at discharge: Children drive her she gave up driving whenhad CVA first time  Discharge Planning Living Arrangements: [place]: Children,Friends/neighbors Type of Residence: Private residence Insurance Resources: Multimedia programmer (specify) Forensic psychologist Medicare) Financial Resources: Radio broadcast assistant Screen Referred: No Living Expenses: Rent Money Management: Patient Does the patient have any problems obtaining your medications?: No Home Management: Patient Patient/Family Preliminary Plans: Return to her condo with a elevator, she lives there alone and children come by and check on her. She does not have 24/7 care. Hopefully pt can get mod/i with her rolling walker before returning home. Care Coordinator Barriers to Discharge: Decreased caregiver support Care Coordinator Barriers to  Discharge Comments: Children can check on but not provide 24/7 supervision Care Coordinator Anticipated Follow Up Needs: HH/OP  Clinical Impression Pleasant female who is willing to work hard in therapies to regain her mod/i level. Her four children are involved but not abel to provide care at discharge, besides intermittent assist. Will work on safe plan for her.  Elease Hashimoto 06/13/2020, 12:58 PM

## 2020-06-13 NOTE — Progress Notes (Signed)
Occupational Therapy Session Note  Patient Details  Name: Taylor Castaneda MRN: [id number] Date of Birth: 28-Dec-1932  Today's Date: 06/13/2020 OT Individual Time: 4144-3601 OT Individual Time Calculation (min): 33 min    Short Term Goals: Week 1:  OT Short Term Goal 1 (Week 1): STG=LTG d/t ELOS  Skilled Therapeutic Interventions/Progress Updates:    Pt received in chair with son present, agreeable to therapy. Session focus on toileting + standing activity tolerance. Sit>stand and amb to bathroom for toilet transfer with RW + CGA for balance. CGA for balance during LB clothing management + use of R grab bar. Incontinent episode of bowels, performed posterior peri-care with min A for balance. Total A to don new brief, min A to pull up pants over B hips. Stood to wash hands at sink with CGA for balance. Transported to gym in w/c, stood for ~6 min at high low table to complete visuocontructive block building task, no noted SOB. Pt req overall max A to accurately complete two rounds of task, with noted decreased problem solving to connect blocks. Pt noted to have picked small wound on R lower leg, applied band aid. Transported back to room, stand-pivot to chair with min A for balance.  Pt left in chair with chair alarm engaged, son present, call bell in reach, and all immediate needs met.    Therapy Documentation Precautions:  Precautions Precautions: Fall,Other (comment) Precaution Comments: monitor vitals with activity, hx of seizures Restrictions Weight Bearing Restrictions: No  Pain: c/o of minimal R knee pain at start of session ADL: See Care Tool for more details.  Therapy/Group: Individual Therapy  Volanda Napoleon, MS, OTR/L Clyda Greener OTR/L  06/13/2020, 4:05 PM

## 2020-06-13 NOTE — Progress Notes (Signed)
Inpatient Rehabilitation  Patient information reviewed and entered into eRehab system by Revella Shelton M. Akita Maxim, M.A., CCC/SLP, PPS Coordinator.  Information including medical coding, functional ability and quality indicators will be reviewed and updated through discharge.    

## 2020-06-13 NOTE — Progress Notes (Signed)
[place] Individual Statement of Services  Patient Name:  Taylor Castaneda  Date:  06/13/2020  Welcome to the [facility].  Our goal is to provide you with an individualized program based on your diagnosis and situation, designed to meet your specific needs.  With this comprehensive rehabilitation program, you will be expected to participate in at least 3 hours of rehabilitation therapies Monday-Friday, with modified therapy programming on the weekends.  Your rehabilitation program will include the following services:  Physical Therapy (PT), Occupational Therapy (OT), Speech Therapy (ST), 24 hour per day rehabilitation nursing, Care Coordinator, Rehabilitation Medicine, Nutrition Services and Pharmacy Services  Weekly team conferences will be held on Wednesday to discuss your progress.  Your Inpatient Rehabilitation Care Coordinator will talk with you frequently to get your input and to update you on team discussions.  Team conferences with you and your family in attendance may also be held.  Expected length of stay: 7-10 days  Overall anticipated outcome: supervision with cueing  Depending on your progress and recovery, your program may change. Your Inpatient Rehabilitation Care Coordinator will coordinate services and will keep you informed of any changes. Your Inpatient Rehabilitation Care Coordinator's name and contact numbers are listed  below.  The following services may also be recommended but are not provided by the [facility]:    [place] will be made to provide these services after discharge if needed.  Arrangements include referral to agencies that provide these services.  Your insurance has been verified to be:  [place] primary doctor is:  [name]  Pertinent information will be shared with your doctor and your  insurance company.  Inpatient Rehabilitation Care Coordinator:  Erlene Quan, [place] or [phone]  Information discussed with and copy given to patient by: Elease Hashimoto, 06/13/2020, 1:01 PM

## 2020-06-13 NOTE — Progress Notes (Signed)
Speech Language Pathology Daily Session Note  Patient Details  Name: Taylor Castaneda MRN: [id number] Date of Birth: 1933-02-11  Today's Date: 06/14/2020 SLP Individual Time: 1300-1355 SLP Individual Time Calculation (min): 55 min  Short Term Goals: Week 1: SLP Short Term Goal 1 (Week 1): Pt will follow basic 2-3 step directions with 80% accuracy provided Min A verbal/visual cues. SLP Short Term Goal 2 (Week 1): Pt will demonstrate ability to use word-finding strategies in functional conversation and other targeted language tasks with Supervision A cues. SLP Short Term Goal 3 (Week 1): Pt will demonstrate ability to problem solve basic familiar functional situations with Min A verbal/visual cueing. SLP Short Term Goal 4 (Week 1): Pt will increase short term recall of functional information with use of compensatory strategies to 70% acc with min A verbal cues  Skilled Therapeutic Interventions: Pt was seen for skilled ST targeting cognitive-linguistic goals. SLP facilitated session with various calendar-based tasks targeting primarily compensatory memory strategies, recall, and problem solving. Pt noted to have increase in phonemic paraphasias and general dysfluencies today, suspect due to increased cognitive demand of tasks. When instructions were read aloud to her, she completed a verbal basic calendar task with Min A verbal and visual cues for problem solving and error awareness, in addition to repetition and some visual cueing for comprehension. Semi-complex calendar task which involved more complex language and charting appointments/other obligations on a physical calendar required Mod A verbal and visual cues for error awareness, problem solving, and Min A for recall within task. Pt acknowledged that she uses a calendar at home but today's tasks were more difficult than baseline or than she would have expected. Discussed cognitive and language deficits in context of acute CVA and need for more  assistance at home. Pt left laying in bed with alarm set and needs within reach. Continue per current plan of care.          Pain Pain Assessment Pain Scale: 0-10 Pain Score: 0-No pain  Therapy/Group: Individual Therapy  Arbutus Leas 06/14/2020, 2:58 PM

## 2020-06-14 ENCOUNTER — Inpatient Hospital Stay (HOSPITAL_COMMUNITY): Payer: Medicare Other | Admitting: Physical Therapy

## 2020-06-14 ENCOUNTER — Inpatient Hospital Stay (HOSPITAL_COMMUNITY): Payer: Medicare Other | Admitting: Occupational Therapy

## 2020-06-14 ENCOUNTER — Inpatient Hospital Stay (HOSPITAL_COMMUNITY): Payer: Medicare Other | Admitting: Speech Pathology

## 2020-06-14 DIAGNOSIS — I639 Cerebral infarction, unspecified: Secondary | ICD-10-CM

## 2020-06-14 DIAGNOSIS — K921 Melena: Secondary | ICD-10-CM

## 2020-06-14 LAB — GLUCOSE, CAPILLARY
Glucose-Capillary: 125 mg/dL — ABNORMAL HIGH (ref 70–99)
Glucose-Capillary: 154 mg/dL — ABNORMAL HIGH (ref 70–99)
Glucose-Capillary: 127 mg/dL — ABNORMAL HIGH (ref 70–99)
Glucose-Capillary: 154 mg/dL — ABNORMAL HIGH (ref 70–99)

## 2020-06-14 LAB — CBC
HCT: 20.5 % — ABNORMAL LOW (ref 36.0–46.0)
Hemoglobin: 6.5 g/dL — CL (ref 12.0–15.0)
MCH: 32.5 pg (ref 26.0–34.0)
MCHC: 31.7 g/dL (ref 30.0–36.0)
MCV: 102.5 fL — ABNORMAL HIGH (ref 80.0–100.0)
Platelets: 162 10*3/uL (ref 150–400)
RBC: 2 MIL/uL — ABNORMAL LOW (ref 3.87–5.11)
RDW: 14.7 % (ref 11.5–15.5)
WBC: 10.4 10*3/uL (ref 4.0–10.5)
nRBC: 0 % (ref 0.0–0.2)

## 2020-06-14 LAB — PREPARE RBC (CROSSMATCH)

## 2020-06-14 MED ORDER — PANTOPRAZOLE SODIUM 40 MG IV SOLR
40.0000 mg | Freq: Two times a day (BID) | INTRAVENOUS | Status: DC
  Administered 2020-06-14 – 2020-06-15 (×2): 40 mg via INTRAVENOUS
  Filled 2020-06-14 (×2): qty 40

## 2020-06-14 MED ORDER — SODIUM CHLORIDE 0.9% IV SOLUTION: Freq: Once | INTRAVENOUS | Status: AC

## 2020-06-14 NOTE — Consult Note (Addendum)
Referring Provider:  [facility]         Primary Care Physician:  [name], MD Primary Gastroenterologist:   Lucio Edward, MD            We were asked to see this patient for:    GI bleed              ASSESSMENT / PLAN:   # 87 yo female admitted late December with acute CVA, on plavix and ASA since late December.  Now with declining hgb and reported dark stools ( no recent oral iron). On DRE, she has light brown stool. No blood in vault. Hemoccults are pending. GI bleed not excluded but also consider intra abdominal / RP bleed.  --Blood transfusion already ordered.  --Doesn't appear to be having an active GI bleed but certainly won't hurt to start QD protonix, especially since she does take ASA. --Trend hgb following transfusion. At increased risk for procedures but will discuss with Dr. Ardis Hughs   # Cirrhosis by [place]. No evidence for portal HTN. She didn't appear to have esophageal varices on recent ERCP. However ERCP probably not best exam to evaluate for them.    # Acute CVA, on ASA and Plavix, sounds like plan is to keep her on Plavix.   # Recent ERCP with removal of CBD stones and sphincterotomy bleed. She is s/p lap cholecystectomy late December. LFTs have continued to improve..   # Remote hx of colon cancer s/p resection. No polyps / cancers on last colonoscopy in 2015. Due to age, no longer on surveillance plan      HPI:                                                                                                                             Chief Complaint: dark stool  Taylor Castaneda is a 87 y.o. female with PMH significant for glaucoma, hyperlipidemia, DM, seizures, CVA, remote colon cancer s/p resection. CKD acute CVA  Patient was admitted late December with seizures and CVA.  She was started on Plavix and aspirin .We saw her during that same admission for RUQ pain and elevated LFTs.  Ultrasound suggested cholelithiasis and cirrhosis . MRCP showed  cholelithiasis, probable distal common bile duct stones, pancreatic atrophy, pancreatic duct dilation in the mid body tail region with an abrupt transition but no obstructing lesion.  Patient underwent ERCP with sphincterotomy and removal of stones from the CBD.  PD could not be cannulated.  Recommendations were made for outpatient EUS.  Patient subsequently underwent laparoscopic cholecystectomy.  Patient was discharged to inpatient rehab on 06/10/2020   We have been asked to evaluate the patient for dark stool and anemia.  Her hemoglobin had been in the 9-10 range during recent admission.  Hemoglobin has drifted over the last several days and is currently down to 7.7.  Patient says she sometimes has dark stools at home but was on iron prior to this  admission.  It does not look like she has received any iron while hospitalized..  Patient was on a full aspirin at home, no other NSAIDs.  She denies abdominal pain, no nausea or vomiting.    PREVIOUS ENDOSCOPIC EVALUATIONS / PERTINENT STUDIES   January 2015 surveillance colonoscopy --Complete exam, excellent prep -- Small hyperplastic polyp --Surveillance colonoscopy not recommended due to age  Past Medical History:  Diagnosis Date  . Anemia   . Diabetes mellitus, type II ([place])   . Glaucoma    Dr.Hecker  . History of colon cancer 1998   Dr. Lennie Hummer  . Hyperlipemia   . Osteopenia 02/2012, 03/2014   DEXA hip -2.2  . Renal insufficiency   . Seizures ([place])   . Stenosing tenosynovitis of finger of left hand 2016   index - s/p steroid injection x2    Past Surgical History:  Procedure Laterality Date  . ABI  11/2013   WNL  . APPENDECTOMY    . BILIARY DILATION  05/31/2020   Procedure: BILIARY DILATION;  Surgeon: Jackquline Denmark, MD;  Location[facility] ENDOSCOPY;  Service: Endoscopy;;  . BIOPSY  05/31/2020   Procedure: BIOPSY;  Surgeon: Jackquline Denmark, MD;  Location: [facility];  Service: Endoscopy;;  . CARPAL TUNNEL RELEASE     Bilateral   .  CATARACT EXTRACTION Bilateral 2006   Implants  . CHOLECYSTECTOMY N/A 06/01/2020   Procedure: LAPAROSCOPIC CHOLECYSTECTOMY;  Surgeon: Clovis Riley, MD;  Location: [place];  Service: General;  Laterality: N/A;  . COLECTOMY  1998   For cancer  . COLONOSCOPY  2008   Dr.Stark, Due 2011  . COLONOSCOPY  06/2013   1 hyperplastic polyp, ileocecal anastomosis [facility])  . dexa  02/2012, 03/2014   T score -2.2 at hip overall stable  . ENDOSCOPIC RETROGRADE CHOLANGIOPANCREATOGRAPHY (ERCP) WITH PROPOFOL N/A 05/31/2020   Procedure: ENDOSCOPIC RETROGRADE CHOLANGIOPANCREATOGRAPHY (ERCP) WITH PROPOFOL;  Surgeon: Jackquline Denmark, MD;  Location: [facility] ENDOSCOPY;  Service: Endoscopy;  Laterality: N/A;  . REFRACTIVE SURGERY    . REMOVAL OF STONES  05/31/2020   Procedure: REMOVAL OF STONES;  Surgeon: Jackquline Denmark, MD;  Location[facility] ENDOSCOPY;  Service: Endoscopy;;  . [name]  05/31/2020   Procedure: [name];  Surgeon: Jackquline Denmark, MD;  Location: [facility] ENDOSCOPY;  Service: Endoscopy;;  . TONSILLECTOMY AND ADENOIDECTOMY    . TOTAL KNEE ARTHROPLASTY Left 12/17/2016   Procedure: LEFT TOTAL KNEE ARTHROPLASTY;  Surgeon: Gaynelle Arabian, MD;  Location: [facility];  Service: Orthopedics;  Laterality: Left;    Prior to Admission medications   Medication Sig Start Date End Date Taking? Authorizing Provider  acetaminophen (TYLENOL) 325 MG tablet Take 325-650 mg by mouth every 6 (six) hours as needed (for pain).    [provider]  aspirin EC 325 MG EC tablet Take 1 tablet (325 mg total) by mouth daily. 06/11/20   Dessa Phi, DO  atorvastatin (LIPITOR) 40 MG tablet Take 1 tablet (40 mg total) by mouth daily at 6 PM. 02/19/20   [name], MD  B-D ULTRAFINE III SHORT PEN 31G X 8 MM MISC USE AS DIRECTED WITH INSULIN 07/09/11   Hendricks Limes, MD  Biotin 10000 MCG TABS Take 10,000 mcg by mouth daily.    [provider]  Carboxymethylcellul-Glycerin 1-0.25 % SOLN Place 1-2 drops into both eyes  3 (three) times daily as needed (for dry/irritated eyes.).    [provider]  carvedilol (COREG) 12.5 MG tablet Take 1 tablet (12.5 mg total) by mouth 2 (two)  times daily. 02/19/20   [name], MD  Cholecalciferol (VITAMIN D3) 25 MCG (1000 UT) CAPS Take 1 capsule (1,000 Units total) by mouth daily. Patient taking differently: Take 2,000 Units by mouth daily. 03/18/19   [name], MD  Cinnamon 500 MG capsule Take 1,000 mg by mouth daily.    [provider]  clopidogrel (PLAVIX) 75 MG tablet Take 1 tablet (75 mg total) by mouth daily. 06/11/20   Dessa Phi, DO  colchicine 0.6 MG tablet Take 1 tablet (0.6 mg total) by mouth daily as needed (gout flare). First day of gout flare, may take 1 tablet twice daily. Patient taking differently: Take 0.6 mg by mouth daily as needed (for gout flares, and may take 0.6 mg TWICE DAILY on 1st day of flare). 02/19/20   [name], MD  Ferrous Sulfate (IRON) 325 (65 Fe) MG TABS Take 325 mg by mouth See admin instructions. Take 325 mg by mouth in the evening on Mondays and Thursdays only    [provider]  HUMALOG KWIKPEN 100 UNIT/ML KiwkPen Inject 1-10 Units into the skin See admin instructions. Inject 1-10 units into the skin three times a day with meals, PER SLIDING SCALE 10/04/16   [provider]  LANTUS SOLOSTAR 100 UNIT/ML Solostar Pen Inject 8 Units into the skin at bedtime. 06/10/20   Dessa Phi, DO  levETIRAcetam (KEPPRA) 500 MG tablet Take 1 tablet (500 mg total) by mouth 2 (two) times daily. 06/10/20   Dessa Phi, DO  Multiple Vitamins-Minerals (PRESERVISION AREDS 2 PO) Take 1 capsule by mouth 2 (two) times daily.    [provider]  potassium chloride (KLOR-CON) 10 MEQ tablet TAKE 1 TABLET BY MOUTH ON MONDAY, [place] Patient taking differently: Take 10 mEq by mouth every Monday, Wednesday, and Friday. 04/20/20   [name], MD  ramipril (ALTACE) 5 MG capsule Take 1  capsule by mouth once daily Patient taking differently: Take 5 mg by mouth daily. 05/13/20   [name], MD    Current Facility-Administered Medications  Medication Dose Route Frequency Provider Last Rate Last Admin  . 0.9 %  sodium chloride infusion (Manually program via Guardrails IV Fluids)   Intravenous Once Kirsteins, Luanna Salk, MD      . acetaminophen (TYLENOL) tablet 1,000 mg  1,000 mg Oral Q6H PRN Angiulli, Lavon Paganini, PA-C      . albuterol (PROVENTIL) (2.5 MG/3ML) 0.083% nebulizer solution 2.5 mg  2.5 mg Nebulization Q6H PRN Angiulli, Lavon Paganini, PA-C      . aspirin EC tablet 325 mg  325 mg Oral Daily Cathlyn Parsons, PA-C   325 mg at 06/14/20 0820  . atorvastatin (LIPITOR) tablet 40 mg  40 mg Oral Daily Cathlyn Parsons, PA-C   40 mg at 06/14/20 0820  . carvedilol (COREG) tablet 12.5 mg  12.5 mg Oral BID Cathlyn Parsons, PA-C   12.5 mg at 06/14/20 0820  . clopidogrel (PLAVIX) tablet 75 mg  75 mg Oral Daily Cathlyn Parsons, PA-C   75 mg at 06/14/20 0820  . diclofenac Sodium (VOLTAREN) 1 % topical gel 2 g  2 g Topical QID Charlett Blake, MD   2 g at 06/14/20 0820  . feeding supplement (ENSURE ENLIVE / ENSURE PLUS) liquid 237 mL  237 mL Oral Q1500 Cathlyn Parsons, PA-C   237 mL at 06/13/20 1812  . insulin aspart (novoLOG) injection 0-9 Units  0-9 Units Subcutaneous TID WC Angiulli, Lavon Paganini, PA-C   1  Units at 06/14/20 0821  . insulin glargine (LANTUS) injection 10 Units  10 Units Subcutaneous QHS Meredith Staggers, MD   10 Units at 06/13/20 2100  . levETIRAcetam (KEPPRA) tablet 500 mg  500 mg Oral BID Cathlyn Parsons, PA-C   500 mg at 06/14/20 0820  . ondansetron (ZOFRAN) tablet 4 mg  4 mg Oral Q6H PRN Angiulli, Lavon Paganini, PA-C       Or  . ondansetron [facility]) injection 4 mg  4 mg Intravenous Q6H PRN Angiulli, Lavon Paganini, PA-C      . polyvinyl alcohol (LIQUIFILM TEARS) 1.4 % ophthalmic solution 1 drop  1 drop Both Eyes TID PRN Angiulli, Lavon Paganini, PA-C      . ramipril  (ALTACE) capsule 5 mg  5 mg Oral Daily Angiulli, Lavon Paganini, PA-C   5 mg at 06/14/20 0820  . traMADol (ULTRAM) tablet 50-100 mg  50-100 mg Oral Q12H PRN Angiulli, Lavon Paganini, PA-C        Allergies as of 06/10/2020 - Review Complete 06/10/2020  Allergen Reaction Noted  . Gabapentin Other (See Comments) 04/13/2020  . Pravastatin sodium Other (See Comments)     Family History  Problem Relation Age of Onset  . Heart attack Father 76  . Stroke Father 39  . Diabetes Father   . Hypertension Father   . Heart disease Father        before age 1  . Breast cancer Mother   . Cancer Mother   . Diabetes Sister   . Cancer Sister   . Peripheral vascular disease Sister   . Breast cancer Sister   . Hypertension Son   . COPD Neg Hx   . Asthma Neg Hx   . Colon cancer Neg Hx   . Esophageal cancer Neg Hx   . Rectal cancer Neg Hx   . Stomach cancer Neg Hx     Social History   Socioeconomic History  . Marital status: Widowed    Spouse name: Not on file  . Number of children: 4  . Years of education: Not on file  . Highest education level: High school graduate  Occupational History  . Occupation: retired    Fish farm manager: Retired    Comment: Regulatory affairs officer  Tobacco Use  . Smoking status: Former Smoker    Years: 2.00    Types: Cigarettes    Quit date: 06/04/1973    Years since quitting: 47.0  . Smokeless tobacco: Never Used  . Tobacco comment: Smoked for 5 years 1965-1970 up to 1/2 pp week  Vaping Use  . Vaping Use: Never used  Substance and Sexual Activity  . Alcohol use: Not Currently  . Drug use: No  . Sexual activity: Never  Other Topics Concern  . Not on file  Social History Narrative   03/10/19 Lives alone, no pets   Neighbors keep an eye on each other - 12 unit condo.    Family nearby.   Occ: retired Regulatory affairs officer   Activity: walking   Diet: good water, fruits/vegetables daily   Caffeine- some tea, occas soda   Social Determinants of Radio broadcast assistant Strain: Not on file   Food Insecurity: Not on file  Transportation Needs: Not on file  Physical Activity: Not on file  Stress: Not on file  Social Connections: Not on file  Intimate Partner Violence: Not on file    Review of Systems: All systems reviewed and negative except where noted in HPI.   OBJECTIVE:  Physical Exam: Vital signs in last 24 hours: Temp:  [97.8 F (36.6 C)-98.2 F (36.8 C)] 97.8 F (36.6 C) (01/11 1211) Pulse Rate:  [81-93] 82 (01/11 1211) Resp:  [16-18] 18 (01/11 1211) BP: (146-167)/(47-92) 146/52 (01/11 1211) SpO2:  [98 %-100 %] 98 % (01/11 1211) Last BM Date: 06/13/20 General:   Alert  female in NAD. Son in room.  Psych:  Pleasant, cooperative. Normal mood and affect. Eyes:  Pupils equal, sclera clear, no icterus.   Conjunctiva pink. Ears:  Normal auditory acuity. Nose:  No deformity, discharge,  or lesions. Neck:  Supple; no masses Lungs:  Clear throughout to auscultation.   No wheezes, crackles, or rhonchi.  Heart:  Regular rate , 1+ BLE edema.  Abdomen:  Soft, non-distended, nontender, BS active, no palp mass  Bruising across distal lower abdomen.  Rectal:  Light brown stool.  Msk:  Symmetrical without gross deformities. . Neurologic:  Alert and  oriented x4;  grossly normal neurologically. Skin:  Intact without significant lesions or rashes.  Filed Weights   06/10/20 1535  Weight: 56.5 kg     Scheduled inpatient medications . sodium chloride   Intravenous Once  . aspirin EC  325 mg Oral Daily  . atorvastatin  40 mg Oral Daily  . carvedilol  12.5 mg Oral BID  . clopidogrel  75 mg Oral Daily  . diclofenac Sodium  2 g Topical QID  . feeding supplement  237 mL Oral Q1500  . insulin aspart  0-9 Units Subcutaneous TID WC  . insulin glargine  10 Units Subcutaneous QHS  . levETIRAcetam  500 mg Oral BID  . ramipril  5 mg Oral Daily      Intake/Output from previous day: 01/10 0701 - 01/11 0700 In: 840 [P.O.:840] Out: -  Intake/Output this shift: No  intake/output data recorded.   Lab Results: Recent Labs    06/13/20 0537 06/14/20 0535  WBC 13.5* 10.4  HGB 7.7* 6.5*  HCT 23.9* 20.5*  PLT 173 162   BMET Recent Labs    06/13/20 0537  NA 139  K 4.4  CL 108  CO2 20*  GLUCOSE 156*  BUN 20  CREATININE 1.04*  CALCIUM 8.5*   LFT Recent Labs    06/13/20 0537  PROT 5.3*  ALBUMIN 2.3*  AST 21  ALT 22  ALKPHOS 156*  BILITOT 0.7   PT/INR No results for input(s): LABPROT, INR in the last 72 hours. Hepatitis Panel No results for input(s): HEPBSAG, HCVAB, HEPAIGM, HEPBIGM in the last 72 hours.   . CBC Latest Ref Rng & Units 06/14/2020 06/13/2020 06/10/2020  WBC 4.0 - 10.5 K/uL 10.4 13.5(H) 11.6(H)  Hemoglobin 12.0 - 15.0 g/dL 6.5(LL) 7.7(L) 8.6(L)  Hematocrit 36.0 - 46.0 % 20.5(L) 23.9(L) 27.0(L)  Platelets 150 - 400 K/uL 162 173 191    . CMP Latest Ref Rng & Units 06/13/2020 06/10/2020 06/07/2020  Glucose 70 - 99 mg/dL 156(H) - 128(H)  BUN 8 - 23 mg/dL 20 - 21  Creatinine 0.44 - 1.00 mg/dL 1.04(H) 1.07(H) 1.03(H)  Sodium 135 - 145 mmol/L 139 - 142  Potassium 3.5 - 5.1 mmol/L 4.4 - 4.2  Chloride 98 - 111 mmol/L 108 - 115(H)  CO2 22 - 32 mmol/L 20(L) - 18(L)  Calcium 8.9 - 10.3 mg/dL 8.5(L) - 8.7(L)  Total Protein 6.5 - 8.1 g/dL 5.3(L) - 5.0(L)  Total Bilirubin 0.3 - 1.2 mg/dL 0.7 - 0.5  Alkaline Phos 38 - 126 U/L 156(H) - 171(H)  AST  15 - 41 U/L 21 - 29  ALT 0 - 44 U/L 22 - 36   Studies/Results: DG Chest 2 View  Result Date: 06/13/2020 CLINICAL DATA:  Dyspnea on exertion. EXAM: CHEST - 2 VIEW COMPARISON:  11/20/2016 FINDINGS: The heart size appears normal. Small bilateral pleural effusions with mild interstitial edema noted. No airspace consolidation. Visualized osseous structures are unremarkable. IMPRESSION: Mild CHF. Electronically Signed   By: Kerby Moors M.D.   On: 06/13/2020 09:58   DG Knee 1-2 Views Right  Result Date: 06/13/2020 CLINICAL DATA:  Shortness of breath on exertion. Right anterior knee pain.  No reported injury. EXAM: RIGHT KNEE - 1-2 VIEW COMPARISON:  None. FINDINGS: Possible trace joint effusion. No acute osseous abnormality. Trace patellofemoral osteophytosis. Prepatellar soft tissue swelling. IMPRESSION: 1. Prepatellar soft tissue swelling. Possible trace joint effusion. No fracture. 2. Trace patellofemoral osteophytosis. . Electronically Signed   By: Lorin Picket M.D.   On: 06/13/2020 09:54    Tye Savoy, NP-C @  06/14/2020, 12:12 PM   ________________________________________________________________________  [facility] MD note:  I personally examined the patient, reviewed the data and agree with the assessment and plan described above.  She is 88, seems to always have dark stools however her Hb has clearly been trending downward in the past 6-7 days. She is on ASA and plavix since CVA that occurred after ERCP/lap chole.  She is still recovering from her CVA.  I spoke with her, her son and her daughter (on phone).  Given her CVA following ERCP/lap chole 10 days ago I would consider quite high risk for another procedure, sedation. For now, blood transfusion and we will start her on IV PPI BID.  OK to continue her ASA and plavix for now.  Family and patient understand that if she proves to be persistently bleeding then we may recommend EGD despite the risks. Will follow along.  Owens Loffler, MD [facility]astroenter[facility] Pager [phone]

## 2020-06-14 NOTE — Progress Notes (Signed)
Physical Therapy Note  Patient Details  Name: Taylor Castaneda MRN: [id number] Date of Birth: 11-28-1932 Today's Date: 06/14/2020    PT attempted to see pt for scheduled PT session however pt politely declined to participate 2/2 actively receiving blood transfusion with decreased hemoglobin levels. MD also entered to meet with pt and pt left with family present and MD. All needs in reach and in good condition. Call light in hand.  Pt missed 75 mins of PT treatment.  Junie Panning 06/14/2020, 3:23 PM

## 2020-06-14 NOTE — Progress Notes (Signed)
[facility] PHYSICAL MEDICINE & REHABILITATION PROGRESS NOTE   Subjective/Complaints:   Discussed knee film and CXR as well as low Hgb Pt feels ok today no dizziness or SOB at rest  Mild knee pain    ROS: Patient denies CP, +SOB when getting OOB, no N/V/D   Objective:   DG Chest 2 View  Result Date: 06/13/2020 CLINICAL DATA:  Dyspnea on exertion. EXAM: CHEST - 2 VIEW COMPARISON:  11/20/2016 FINDINGS: The heart size appears normal. Small bilateral pleural effusions with mild interstitial edema noted. No airspace consolidation. Visualized osseous structures are unremarkable. IMPRESSION: Mild CHF. Electronically Signed   By: Kerby Moors M.D.   On: 06/13/2020 09:58   DG Knee 1-2 Views Right  Result Date: 06/13/2020 CLINICAL DATA:  Shortness of breath on exertion. Right anterior knee pain. No reported injury. EXAM: RIGHT KNEE - 1-2 VIEW COMPARISON:  None. FINDINGS: Possible trace joint effusion. No acute osseous abnormality. Trace patellofemoral osteophytosis. Prepatellar soft tissue swelling. IMPRESSION: 1. Prepatellar soft tissue swelling. Possible trace joint effusion. No fracture. 2. Trace patellofemoral osteophytosis. . Electronically Signed   By: Lorin Picket M.D.   On: 06/13/2020 09:54   Recent Labs    06/13/20 0537 06/14/20 0535  WBC 13.5* 10.4  HGB 7.7* 6.5*  HCT 23.9* 20.5*  PLT 173 162   Recent Labs    06/13/20 0537  NA 139  K 4.4  CL 108  CO2 20*  GLUCOSE 156*  BUN 20  CREATININE 1.04*  CALCIUM 8.5*    Intake/Output Summary (Last 24 hours) at 06/14/2020 0801 Last data filed at 06/13/2020 1900 Gross per 24 hour  Intake 480 ml  Output --  Net 480 ml     Pressure Injury 06/10/20 Coccyx Medial Stage 1 -  Intact skin with non-blanchable redness of a localized area usually over a bony prominence. (Active)  06/10/20 2200  Location: Coccyx  Location Orientation: Medial  Staging: Stage 1 -  Intact skin with non-blanchable redness of a localized area usually  over a bony prominence.  Wound Description (Comments):   Present on Admission: Yes    Physical Exam: Vital Signs Blood pressure (!) 156/92, pulse 83, temperature 98.2 F (36.8 C), temperature source Oral, resp. rate 16, height 5' (1.524 m), weight 56.5 kg, SpO2 100 %.  General: No acute distress Mood and affect are appropriate Heart: Regular rate and rhythm no rubs murmurs or extra sounds Lungs: Clear to auscultation, breathing unlabored, no rales or wheezes Abdomen: Positive bowel sounds, soft nontender to palpation, nondistended Extremities: No clubbing, cyanosis, or edema Skin: No evidence of breakdown, no evidence of rash   Skin: right wrist wound with vaseline gauze and kerlix--drainage much improved Neuro: oriented x3, language more fluent by the day. Reasonable insight and awareness. RUE 4+/5 RLE 4+/5. LUE and LLE 4/5. No focal sensory deficits appreciated Musculoskeletal: no evidence of prepatellar effusion no jt effusion       Assessment/Plan: 1. Functional deficits which require 3+ hours per day of interdisciplinary therapy in a comprehensive inpatient rehab setting.  Physiatrist is providing close team supervision and 24 hour management of active medical problems listed below.  Physiatrist and rehab team continue to assess barriers to discharge/monitor patient progress toward functional and medical goals  Care Tool:  Bathing    Body parts bathed by patient: Right arm,Left arm,Chest,Abdomen (UB only today)   Body parts bathed by helper: Buttocks,Right lower leg,Left lower leg     Bathing assist Assist Level: Supervision/Verbal cueing  Upper Body Dressing/Undressing Upper body dressing Upper body dressing/undressing activity did not occur (including orthotics): Safety/medical concerns What is the patient wearing?: Pull over shirt    Upper body assist Assist Level: Minimal Assistance - Patient > 75%    Lower Body Dressing/Undressing Lower body  dressing      What is the patient wearing?: Incontinence brief,Pants     Lower body assist Assist for lower body dressing: Maximal Assistance - Patient 25 - 49%     Toileting Toileting    Toileting assist Assist for toileting: Minimal Assistance - Patient > 75%     Transfers Chair/bed transfer  Transfers assist     Chair/bed transfer assist level: Minimal Assistance - Patient > 75%     Locomotion Ambulation   Ambulation assist      Assist level: Minimal Assistance - Patient > 75% Assistive device: Hand held assist Max distance: 63ft   Walk 10 feet activity   Assist  Walk 10 feet activity did not occur:  (knee pain and s.o.b)  Assist level: Minimal Assistance - Patient > 75% Assistive device: Hand held assist   Walk 50 feet activity   Assist Walk 50 feet with 2 turns activity did not occur: Safety/medical concerns  Assist level: Minimal Assistance - Patient > 75% Assistive device: Hand held assist    Walk 150 feet activity   Assist Walk 150 feet activity did not occur: Safety/medical concerns         Walk 10 feet on uneven surface  activity   Assist Walk 10 feet on uneven surfaces activity did not occur: Safety/medical concerns         Wheelchair     Assist Will patient use wheelchair at discharge?: No             Wheelchair 50 feet with 2 turns activity    Assist            Wheelchair 150 feet activity     Assist          Blood pressure (!) 156/92, pulse 83, temperature 98.2 F (36.8 C), temperature source Oral, resp. rate 16, height 5' (1.524 m), weight 56.5 kg, SpO2 100 %.  Medical Problem List and Plan: 1.Decreased functional ability with aphasiasecondary to several punctate right centrum semiovale infarct likely due to severe intracranial stenosis -patient may Shower with right wrist covered. -ELOS/Goals: 7-10 days, Mod I with PT, OT and min assist with SLP  -continue  therapies at bedside until transfused 2. Antithrombotics: -DVT/anticoagulation:Subcutaneous heparin switched to lovenox- will hold givne severe anemia  -antiplatelet therapy: Aspirin 325 mg daily and Plavix 75 mg daily x3 months then Plavix alone 3. Pain Management:Tramadol as needed 4. Mood:Provide emotional support -antipsychotic agents: N/A 5. Neuropsych: This patientiscapable of making decisions on herown behalf. 6. Skin/Wound Care:Routine skin checks 7. Fluids/Electrolytes/Nutrition:Routine in and outs with follow-up chemistries 8. Cholelithiasis/choledocholithiasis. Status post ERCP followed by laparoscopic cholecystectomy 06/01/2020 -pt still denies pain 9. Seizure disorder. Keppra 500 mg twice daily. EEG negative 10. Hypertension. Altace 5 mg daily, Coreg 12.5 mg twice daily. -1/9 SBP has been slightly labile but want to avoid hypoperfusion--- Vitals:   06/13/20 1940 06/14/20 0610  BP: (!) 153/47 (!) 156/92  Pulse: 81 83  Resp: 16 16  Temp: 98.1 F (36.7 C) 98.2 F (36.8 C)  SpO2: 100% 100%  In desired range this am - avoid hypoperfusion 11. Diabetes mellitus. Hemoglobin A1c 8.7. Lantus insulin 8 units nightly---pt takes 16u at bedtime at home  CBG (last 3)  Recent Labs    06/13/20 2015 06/13/20 2141 06/14/20 0611  GLUCAP 196* 161* 125*  good control this am on lantus 10U   12. Hyperlipidemia. Lipitor 13. History of colon cancer with resection 1998. Follow-up as outpatient  14.  Leukocytosis no fever monitor for now  15.  DOE- likely deconditioning plus anemia, will monitor in therapy , check CXR to look for pulm edema pulm HTN , off lasix 16,  ABLA- ask GI to re eval suspect GI blood loss although no bright red blood- transfuse given poor activity tolerance noted , son has noticed dark stools  17.   LOS: 4 days A FACE TO FACE EVALUATION WAS PERFORMED  Charlett Blake 06/14/2020, 8:01 AM

## 2020-06-15 ENCOUNTER — Inpatient Hospital Stay (HOSPITAL_COMMUNITY): Payer: Medicare Other | Admitting: Occupational Therapy

## 2020-06-15 ENCOUNTER — Inpatient Hospital Stay (HOSPITAL_COMMUNITY): Payer: Medicare Other | Admitting: Physical Therapy

## 2020-06-15 ENCOUNTER — Inpatient Hospital Stay (HOSPITAL_COMMUNITY): Payer: Medicare Other

## 2020-06-15 ENCOUNTER — Inpatient Hospital Stay (HOSPITAL_COMMUNITY): Payer: Medicare Other | Admitting: Speech Pathology

## 2020-06-15 DIAGNOSIS — K922 Gastrointestinal hemorrhage, unspecified: Secondary | ICD-10-CM

## 2020-06-15 DIAGNOSIS — K921 Melena: Secondary | ICD-10-CM

## 2020-06-15 LAB — BPAM RBC
Blood Product Expiration Date: 202202112359
ISSUE DATE / TIME: 202201111212
Unit Type and Rh: 5100

## 2020-06-15 LAB — CBC
HCT: 28.4 % — ABNORMAL LOW (ref 36.0–46.0)
Hemoglobin: 8.9 g/dL — ABNORMAL LOW (ref 12.0–15.0)
MCH: 31.6 pg (ref 26.0–34.0)
MCHC: 31.3 g/dL (ref 30.0–36.0)
MCV: 100.7 fL — ABNORMAL HIGH (ref 80.0–100.0)
Platelets: 172 10*3/uL (ref 150–400)
RBC: 2.82 MIL/uL — ABNORMAL LOW (ref 3.87–5.11)
RDW: 15.7 % — ABNORMAL HIGH (ref 11.5–15.5)
WBC: 10 10*3/uL (ref 4.0–10.5)
nRBC: 0 % (ref 0.0–0.2)

## 2020-06-15 LAB — TYPE AND SCREEN
ABO/RH(D): O POS
Antibody Screen: NEGATIVE
Unit division: 0

## 2020-06-15 LAB — GLUCOSE, CAPILLARY
Glucose-Capillary: 90 mg/dL (ref 70–99)
Glucose-Capillary: 109 mg/dL — ABNORMAL HIGH (ref 70–99)
Glucose-Capillary: 194 mg/dL — ABNORMAL HIGH (ref 70–99)
Glucose-Capillary: 194 mg/dL — ABNORMAL HIGH (ref 70–99)

## 2020-06-15 MED ORDER — PANTOPRAZOLE SODIUM 40 MG PO TBEC
40.0000 mg | DELAYED_RELEASE_TABLET | Freq: Two times a day (BID) | ORAL | Status: DC
  Administered 2020-06-15 – 2020-06-24 (×18): 40 mg via ORAL
  Filled 2020-06-15 (×18): qty 1

## 2020-06-15 MED ORDER — LIVING WELL WITH DIABETES BOOK
Freq: Once | Status: AC
  Filled 2020-06-15: qty 1

## 2020-06-15 MED ORDER — FUROSEMIDE 20 MG PO TABS
20.0000 mg | ORAL_TABLET | Freq: Every day | ORAL | Status: DC
  Administered 2020-06-15: 20 mg via ORAL
  Filled 2020-06-15: qty 1

## 2020-06-15 MED ORDER — BLOOD PRESSURE CONTROL BOOK
Freq: Once | Status: AC
  Filled 2020-06-15: qty 1

## 2020-06-15 NOTE — Progress Notes (Signed)
Physical Therapy Session Note  Patient Details  Name: Taylor Castaneda MRN: [id number] Date of Birth: 1933-03-30  Today's Date: 06/15/2020 PT Individual Time: 1520-1600 PT Individual Time Calculation (min): 40 min   Short Term Goals: Week 1:  PT Short Term Goal 1 (Week 1): = to LTGs based on ELOS  Skilled Therapeutic Interventions/Progress Updates:   Pt received sitting in recliner and agreeable to PT. Pt performed ambulatory transfer to [facility] with min assist from PT for safety. Noted to have step to gait pattern with no change following cues.   Pt transported to rehab gym in [facility]. Gait training with RW 80f x 2 with min assist from PT facilitate pelvic rotation. Noted improvement in gluteal activation and step length on the L.   Sit<>[facility] to push form arm rests. Min assist progressing to CGA form PT with noted improvement anterior weight shift following increased repetitions.  Reciprocal stepping over 1 inch obstacle on floor  2 X 10 BLE to force improve WB on the RLE. BUE support on RW throughout.   Dynamic standing balance on airex pad while completing low difficulty peg board puzzle. Min assist initially progressing to supervision assist with min cues for awareness of error and attention to task.   Patient returned to room and left sitting in recliner with call bell in reach and all needs met.         Therapy Documentation Precautions:  Precautions Precautions: Fall,Other (comment) Precaution Comments: monitor vitals with activity, hx of seizures Restrictions Weight Bearing Restrictions: No Vital Signs: Therapy Vitals Temp: 98.1 F (36.7 C) Temp Source: Oral Pulse Rate: 73 Resp: 17 BP: (!) 123/57 Patient Position (if appropriate): Lying Oxygen Therapy SpO2: 99 % O2 Device: Room Air Pain: Pain Assessment Pain Scale: 0-10 Pain Score: 0-No pain    Therapy/Group: Individual Therapy  ALorie Phenix1/05/2021, 4:37 PM

## 2020-06-15 NOTE — Progress Notes (Signed)
[facility] Progress Note    Since last GI note: She received one unit blood yesterday, CBC this AM shows a very nice bump in Hb  From 6.5 to 8.9 after that one unit.  She was started on PPI IV BID yesterday as well. Dark stool yesterday which is very common for her.  She is eating well, no n/vomitign or abd pains.  CT ordered for today to check for RP bleeding.  Objective: Vital signs in last 24 hours: Temp:  [97.8 F (36.6 C)-99.2 F (37.3 C)] 98.5 F (36.9 C) (01/11 1953) Pulse Rate:  [70-93] 70 (01/12 0647) Resp:  [17-20] 20 (01/12 0611) BP: (146-193)/(51-84) 165/69 (01/12 0647) SpO2:  [98 %-100 %] 100 % (01/12 0611) Last BM Date: 06/13/20 General: alert and oriented times 3 Heart: regular rate and rythm Abdomen: soft, non-tender, non-distended, normal bowel sounds  Lab Results: Recent Labs    06/13/20 0537 06/14/20 0535  WBC 13.5* 10.4  HGB 7.7* 6.5*  PLT 173 162  MCV 103.0* 102.5*   Recent Labs    06/13/20 0537  NA 139  K 4.4  CL 108  CO2 20*  GLUCOSE 156*  BUN 20  CREATININE 1.04*  CALCIUM 8.5*   Recent Labs    06/13/20 0537  PROT 5.3*  ALBUMIN 2.3*  AST 21  ALT 22  ALKPHOS 156*  BILITOT 0.7    Studies/Results: DG Chest 2 View  Result Date: 06/13/2020 CLINICAL DATA:  Dyspnea on exertion. EXAM: CHEST - 2 VIEW COMPARISON:  11/20/2016 FINDINGS: The heart size appears normal. Small bilateral pleural effusions with mild interstitial edema noted. No airspace consolidation. Visualized osseous structures are unremarkable. IMPRESSION: Mild CHF. Electronically Signed   By: Kerby Moors M.D.   On: 06/13/2020 09:58   DG Knee 1-2 Views Right  Result Date: 06/13/2020 CLINICAL DATA:  Shortness of breath on exertion. Right anterior knee pain. No reported injury. EXAM: RIGHT KNEE - 1-2 VIEW COMPARISON:  None. FINDINGS: Possible trace joint effusion. No acute osseous abnormality. Trace patellofemoral osteophytosis. Prepatellar soft tissue  swelling. IMPRESSION: 1. Prepatellar soft tissue swelling. Possible trace joint effusion. No fracture. 2. Trace patellofemoral osteophytosis. . Electronically Signed   By: Lorin Picket M.D.   On: 06/13/2020 09:54    Medications: Scheduled Meds: . aspirin EC  325 mg Oral Daily  . atorvastatin  40 mg Oral Daily  . carvedilol  12.5 mg Oral BID  . clopidogrel  75 mg Oral Daily  . diclofenac Sodium  2 g Topical QID  . feeding supplement  237 mL Oral Q1500  . insulin aspart  0-9 Units Subcutaneous TID WC  . insulin glargine  10 Units Subcutaneous QHS  . levETIRAcetam  500 mg Oral BID  . pantoprazole (PROTONIX) IV  40 mg Intravenous Q12H  . ramipril  5 mg Oral Daily   Continuous Infusions: PRN Meds:.acetaminophen, albuterol, ondansetron **OR** ondansetron (ZOFRAN) IV, polyvinyl alcohol, traMADol    Assessment/Plan: 87 y.o. female with drifting Hb, chronic dark stools  She has had dark stools for a very long time and so that is not a very reliable marker for GI bleeding for her. Certainly a very good bump in Hb with just one unit (from 6.5 to 8.9).  She is getting a CT abd today to check for RP bleeding.  I am reluctant to proceed with EGD still, she had a CVA following ERCP/lap chole just 1-2 weeks ago.  OK to continue plavix, asa. PPI BID started yesterday should continue, possibly  indefinitely. Will follow along.   Milus Banister, MD  06/15/2020, 8:31 AM [facility] Pager [phone]

## 2020-06-15 NOTE — Progress Notes (Signed)
RN notified Taylor Ball, NP in regards to BP elevated. Pt denies HA, dizziness, nausea, and pain.There are no clear parameters for BP. Notes from physician reads to avoid hypoperfusion. RN will continue to monitor. Pt in no acute distress.

## 2020-06-15 NOTE — Progress Notes (Incomplete)
RN notified Taylor Ball, NP in regards to BP elevated. There are no clear parameters for BP. Notes from physician reads to avoid hypoperfusion.

## 2020-06-15 NOTE — Progress Notes (Addendum)
Speech Language Pathology Daily Session Note  Patient Details  Name: Taylor Castaneda MRN: [id number] Date of Birth: 1932/06/07  Today's Date: 06/15/2020 SLP Individual Time: 0730-0825 SLP Individual Time Calculation (min): 55 min  Short Term Goals: Week 1: SLP Short Term Goal 1 (Week 1): Pt will follow basic 2-3 step directions with 80% accuracy provided Min A verbal/visual cues. SLP Short Term Goal 2 (Week 1): Pt will demonstrate ability to use word-finding strategies in functional conversation and other targeted language tasks with Supervision A cues. SLP Short Term Goal 3 (Week 1): Pt will demonstrate ability to problem solve basic familiar functional situations with Min A verbal/visual cueing. SLP Short Term Goal 4 (Week 1): Pt will increase short term recall of functional information with use of compensatory strategies to 70% acc with min A verbal cues  Skilled Therapeutic Interventions: Pt was seen for skilled ST targeting cognitive skills. Pt's son present for 3/4 of session. SLP facilitated session with Min A verbal cues for recall of yesterday's activities. She also completed basic PEG board design tasks in which she had to recreate designs from photographs on a PEG board with Mod faded to [place] A verbal and visual cueing for error awareness and problem solving. Pt expressed herself well this morning, with extra time for full fluent verbal responses intermittently. She did exhibit some wheezing while resting in bed this AM, however SpO2 reading was 99. Pt left sitting in bed with alarm set and needs within reach, son still present. Continue per current plan of care.      Pain Pain Assessment Pain Scale: 0-10 Pain Score: 0-No pain  Therapy/Group: Individual Therapy  Arbutus Leas 06/15/2020, 7:19 AM

## 2020-06-15 NOTE — Progress Notes (Signed)
Pt began wheezing when walking to the bathroom. Eyvonne Mechanic, RN gave breathing tx while primary RN was with another pt. BP elevated to 004H systolic. Primary RN notified Linna Hoff, [place]. Dan PA ordered to give carvedilol early. Pt in no acute distress. RN will continue to monitor.

## 2020-06-15 NOTE — Progress Notes (Signed)
RN will continue to monitor BP. RN read in note that MD would like to prevent hypoperfusion. If pt develop symptoms RN will notify MD. Pt in no acute distress. RN will continue to monitor.

## 2020-06-15 NOTE — Progress Notes (Signed)
Patient ID: DENNY LAVE, female   DOB: [birth year], 87 y.o.   MRN: [id number] Mat with the patient and son to review role of the nurse CM and collaboration with the SW to facilitate preparation for discharge. Reviewed secondary stroke risk management including HTN, HLD, DM with A1c of 8.7. Also reviewed DAPT for 3 months per MD than Plavix solo. Handouts given on CMM diet, DASH diet and dyslipidemia with handbooks on DM and HTN management. Son reports he and his sister will assist with insulin and DM management. He had attended a class on DM as he is a diabetic as well and familiar with CBG testing. Will need education on insulin administration however sister is more familiar with injections using the insulin pen.  Continue to follow along to discharge and address educational needs. No other questions or concerns noted at present. Margarito Liner

## 2020-06-15 NOTE — Progress Notes (Signed)
Physical Therapy Session Note  Patient Details  Name: Taylor Castaneda MRN: [id number] Date of Birth: 1932-09-05  Today's Date: 06/15/2020 PT Individual Time: 0900-0945 PT Individual Time Calculation (min): 45 min   Short Term Goals: Week 1:  PT Short Term Goal 1 (Week 1): = to LTGs based on ELOS  Skilled Therapeutic Interventions/Progress Updates:   Patient received sitting up in bed, agreeable to in-room PT session. She denies pain at rest. BP reclined in bed: 135/44 (72) HR 67BPM. She was able to come sit edge of bed with MinA. BP in sitting: 132/80 (96) HR 72BPM. Patient requiring ModA to come to stand with strong posterior bias initially. BP in standing: 131/56 (78) HR 103BPM. Patient without any reported symptoms. Patient completing the following therex seated edge of bed: heel slides 3x10; marches 3x10; scap retraction 3x10; U UE shoulder press 3x10 3x10, sit <> stand x3 with MinA and verbal cues for anterior weight shift. Patient returning supine in bed. Bed alarm on, call light within reach.    Therapy Documentation Precautions:  Precautions Precautions: Fall,Other (comment) Precaution Comments: monitor vitals with activity, hx of seizures Restrictions Weight Bearing Restrictions: No    Therapy/Group: Individual Therapy  Karoline Caldwell, PT, DPT, CBIS  06/15/2020, 7:38 AM

## 2020-06-15 NOTE — Progress Notes (Signed)
Occupational Therapy Session Note  Patient Details  Name: Taylor Castaneda MRN: [id number] Date of Birth: [birth year]  Today's Date: 06/15/2020 OT Individual Time: 4239-5320 OT Individual Time Calculation (min): 44 min    Short Term Goals: Week 1:  OT Short Term Goal 1 (Week 1): STG=LTG d/t ELOS  Skilled Therapeutic Interventions/Progress Updates:    Pt in bed to start session with O2 sats at 96% and HR in the upper 70s.  She was ok'd by nursing to go out of the room for therapy based on vitals being stable and HGB up from yesterday.  Pt in agreement and transferred to the EOB with min assist.  She needed max assist for donning TEDs and shoes with min assist for transfer to the wheelchair with use of the RW.  Took her down to the dayroom where she completed sit to stand from the wheelchair on the scale to get a weight.  Therapist then took pt to the high low table where she worked on cognitive task in standing having to replicate a peg design.  She was able to maintain standing for 3 mins to complete 8-10 peg design with min instructional cueing.  Had her stand up a second time for 6 mins before needing a rest to complete a harder design.  Oxygen sats still above 95% with HR at 86.  She exhibited moderate difficulty completing 24 peg design and overall needed max assist to recognize errors and fix them.  Finished session with transfer back to the room via wheelchair and transfer to the recliner with min assist to rest.  Call button and phone in reach with safety alarm pad in place.  Pt's son also in room as well.   Therapy Documentation Precautions:  Precautions Precautions: Fall,Other (comment) Precaution Comments: monitor vitals with activity, hx of seizures Restrictions Weight Bearing Restrictions: No  Pain: Pain Assessment Pain Scale: 0-10 Pain Score: 0-No pain ADL: See Care Tool Section for some details of mobility and selfcare  Therapy/Group: Individual Therapy  Jamerson Vonbargen  OTR/L 06/15/2020, 3:51 PM

## 2020-06-15 NOTE — Patient Care Conference (Signed)
Inpatient RehabilitationTeam Conference and Plan of Care Update Date: 06/15/2020   Time: 10:08 AM    Patient Name: Taylor Castaneda      Medical Record Number: [id number]  Date of Birth: February 13, 1933 Sex: Female         Room/Bed: 4W26C/4W26C-01 Payor Info: Payor: [employer] / Plan: BCBS MEDICARE / Product Type: *No Product type* /    Admit Date/Time:  06/10/2020  3:40 PM  Primary Diagnosis:  Subcortical infarction [facility])  Hospital Problems: Principal Problem:   Subcortical infarction [facility]) Active Problems:   Pressure injury of skin   Melena    Expected Discharge Date: Expected Discharge Date: 06/24/20  Team Members Present: Physician leading conference: Dr. Alysia Penna Care Coodinator Present: Erlene Quan, BSW;Sanyla Summey Hervey Ard, RN, BSN, [place] Nurse Present: Rayne Du, LPN PT Present: Barrie Folk, PT OT Present: Other (comment) Providence Lanius, OT) SLP Present: Jettie Booze, CF-SLP PPS Coordinator present : Ileana Ladd, Burna Mortimer, SLP     Current Status/Progress Goal Weekly Team Focus  Bowel/Bladder   pt continent of B/B LBM 06/15/19  Pt remain continent  q2 toileting   Swallow/Nutrition/ Hydration             ADL's   CGA to min: toileting + toilet transfer; min: UB dressing/bathing, mod to max: LB dressing/bathing  supervision  selfcare retraining, balance retraining, activity tolerance, pt/fam edu, DME/AE edu, functional transfer retraining, functional cognition   Mobility             Communication   mild word finding deficits and dysfluencies, Min A  Supervision  word finding strategies   Safety/Cognition/ Behavioral Observations  Min-Mod  Supervision-Min  recall functional information with strategies/aids, basic problem solving, following directions   Pain   pt has no complaints of pain  Pt will not have pain during stay  pain assessment per shift, PRN meds   Skin   pt has tear on right arm  RN will have proper wound  healing.and no further skin breakdown  continue wound dx xerofoam with gauze     Discharge Planning:  D/C home MOD I with intermittent SUP from 4 children. Has: RW and Cane. Lives in [place] with elevator   Team Discussion: Subcortical infarct peri op and post cholecystitis and ERCP complicated by low Hgb. Required transfusion 06/14/20. MD following up on possible retroperitoneal bleed as cause for drop in Hgb. Continent of bowel and bladder. Progress limited by fatigue.  Patient on target to meet rehab goals: yes, currently CGA-min assist for toileting, min assist for upper body bathing and dressing and mod-max lower body dressing. Min  Assist for ambulation of short distance with min assist and supervision goals overall for discharge.  *See Care Plan and progress notes for long and short-term goals.   Revisions to Treatment Plan:  Sequencing and problem solving issues and word finding difficulties with aphasia.  Teaching Needs: Transfers, toileting,medications, etc  Current Barriers to Discharge: Decreased caregiver support and Home enviroment access/layout  Possible Resolutions to Barriers: Family Education     Medical Summary Current Status: anemia improved after transfusion, evaluating etiology of ABLA, fatigue, slow processing , word finding defs  Barriers to Discharge: Decreased family/caregiver support;Medical stability   Possible Resolutions to [employer] Focus: check CT abd/pelvis,   Continued Need for Acute Rehabilitation Level of Care: The patient requires daily medical management by a physician with specialized training in physical medicine and rehabilitation for the following reasons: Direction of a multidisciplinary physical rehabilitation program  to maximize functional independence : Yes Medical management of patient stability for increased activity during participation in an intensive rehabilitation regime.: Yes Analysis of laboratory values and/or radiology  reports with any subsequent need for medication adjustment and/or medical intervention. : Yes   I attest that I was present, lead the team conference, and concur with the assessment and plan of the team.   Dorien Chihuahua B 06/15/2020, 3:02 PM

## 2020-06-15 NOTE — Progress Notes (Signed)
Patient ID: Taylor Castaneda, female   DOB: 01/02/1933, 87 y.o.   MRN: [id number] Team Conference Report to Patient/Family  Team Conference discussion was reviewed with the patient and caregiver, including goals, any changes in plan of care and target discharge date.  Patient and caregiver express understanding and are in agreement.  The patient has a target discharge date of 06/24/20.  [name] 06/15/2020, 1:19 PM

## 2020-06-15 NOTE — Progress Notes (Signed)
[place] PHYSICAL MEDICINE & REHABILITATION PROGRESS NOTE   Subjective/Complaints:   No abd pain, had episode of wheezing this am relieved with inhaler Appreciate GI consult    ROS: Patient denies CP, +SOB when getting OOB, no N/V/D   Objective:   DG Chest 2 View  Result Date: 06/13/2020 CLINICAL DATA:  Dyspnea on exertion. EXAM: CHEST - 2 VIEW COMPARISON:  11/20/2016 FINDINGS: The heart size appears normal. Small bilateral pleural effusions with mild interstitial edema noted. No airspace consolidation. Visualized osseous structures are unremarkable. IMPRESSION: Mild CHF. Electronically Signed   By: Kerby Moors M.D.   On: 06/13/2020 09:58   DG Knee 1-2 Views Right  Result Date: 06/13/2020 CLINICAL DATA:  Shortness of breath on exertion. Right anterior knee pain. No reported injury. EXAM: RIGHT KNEE - 1-2 VIEW COMPARISON:  None. FINDINGS: Possible trace joint effusion. No acute osseous abnormality. Trace patellofemoral osteophytosis. Prepatellar soft tissue swelling. IMPRESSION: 1. Prepatellar soft tissue swelling. Possible trace joint effusion. No fracture. 2. Trace patellofemoral osteophytosis. . Electronically Signed   By: Lorin Picket M.D.   On: 06/13/2020 09:54   Recent Labs    06/13/20 0537 06/14/20 0535  WBC 13.5* 10.4  HGB 7.7* 6.5*  HCT 23.9* 20.5*  PLT 173 162   Recent Labs    06/13/20 0537  NA 139  K 4.4  CL 108  CO2 20*  GLUCOSE 156*  BUN 20  CREATININE 1.04*  CALCIUM 8.5*    Intake/Output Summary (Last 24 hours) at 06/15/2020 0800 Last data filed at 06/15/2020 3295 Gross per 24 hour  Intake 830 ml  Output -  Net 830 ml     Pressure Injury 06/10/20 Coccyx Medial Stage 1 -  Intact skin with non-blanchable redness of a localized area usually over a bony prominence. (Active)  06/10/20 2200  Location: Coccyx  Location Orientation: Medial  Staging: Stage 1 -  Intact skin with non-blanchable redness of a localized area usually over a bony  prominence.  Wound Description (Comments):   Present on Admission: Yes    Physical Exam: Vital Signs Blood pressure (!) 165/69, pulse 70, temperature 98.5 F (36.9 C), resp. rate 20, height 5' (1.524 m), weight 56.5 kg, SpO2 100 %.  General: No acute distress Mood and affect are appropriate Heart: Regular rate and rhythm no rubs murmurs or extra sounds Lungs: Clear to auscultation, breathing unlabored, no rales or wheezes Abdomen: Positive bowel sounds, soft nontender to palpation, nondistended Extremities: No clubbing, cyanosis, or edema Skin: No evidence of breakdown, no evidence of rash   Skin: right wrist wound with vaseline gauze and kerlix--drainage much improved Neuro: oriented x3, language more fluent by the day. Reasonable insight and awareness. RUE 4+/5 RLE 4+/5. LUE and LLE 4/5. No focal sensory deficits appreciated Musculoskeletal: no evidence of prepatellar effusion no jt effusion       Assessment/Plan: 1. Functional deficits which require 3+ hours per day of interdisciplinary therapy in a comprehensive inpatient rehab setting.  Physiatrist is providing close team supervision and 24 hour management of active medical problems listed below.  Physiatrist and rehab team continue to assess barriers to discharge/monitor patient progress toward functional and medical goals  Care Tool:  Bathing    Body parts bathed by patient: Right arm,Left arm,Chest,Abdomen (UB only today)   Body parts bathed by helper: Buttocks,Right lower leg,Left lower leg     Bathing assist Assist Level: Supervision/Verbal cueing     Upper Body Dressing/Undressing Upper body dressing Upper body dressing/undressing activity  did not occur (including orthotics): Safety/medical concerns What is the patient wearing?: Pull over shirt    Upper body assist Assist Level: Minimal Assistance - Patient > 75%    Lower Body Dressing/Undressing Lower body dressing      What is the patient wearing?:  Incontinence brief,Pants     Lower body assist Assist for lower body dressing: Maximal Assistance - Patient 25 - 49%     Toileting Toileting    Toileting assist Assist for toileting: Minimal Assistance - Patient > 75%     Transfers Chair/bed transfer  Transfers assist     Chair/bed transfer assist level: Minimal Assistance - Patient > 75%     Locomotion Ambulation   Ambulation assist      Assist level: Minimal Assistance - Patient > 75% Assistive device: Hand held assist Max distance: 46ft   Walk 10 feet activity   Assist  Walk 10 feet activity did not occur:  (knee pain and s.o.b)  Assist level: Minimal Assistance - Patient > 75% Assistive device: Hand held assist   Walk 50 feet activity   Assist Walk 50 feet with 2 turns activity did not occur: Safety/medical concerns  Assist level: Minimal Assistance - Patient > 75% Assistive device: Hand held assist    Walk 150 feet activity   Assist Walk 150 feet activity did not occur: Safety/medical concerns         Walk 10 feet on uneven surface  activity   Assist Walk 10 feet on uneven surfaces activity did not occur: Safety/medical concerns         Wheelchair     Assist Will patient use wheelchair at discharge?: No             Wheelchair 50 feet with 2 turns activity    Assist            Wheelchair 150 feet activity     Assist          Blood pressure (!) 165/69, pulse 70, temperature 98.5 F (36.9 C), resp. rate 20, height 5' (1.524 m), weight 56.5 kg, SpO2 100 %.  Medical Problem List and Plan: 1.Decreased functional ability with aphasiasecondary to several punctate right centrum semiovale infarct likely due to severe intracranial stenosis -patient may Shower with right wrist covered. -ELOS/Goals: 7-10 days, Mod I with PT, OT and min assist with SLP  -may resume full therapy , as discussed with PT check ortho vitals 2.  Antithrombotics: -DVT/anticoagulation:Subcutaneous heparin switched to lovenox- will hold givne severe anemia  -antiplatelet therapy: Aspirin 325 mg daily and Plavix 75 mg daily x3 months then Plavix alone 3. Pain Management:Tramadol as needed 4. Mood:Provide emotional support -antipsychotic agents: N/A 5. Neuropsych: This patientiscapable of making decisions on herown behalf. 6. Skin/Wound Care:Routine skin checks 7. Fluids/Electrolytes/Nutrition:Routine in and outs with follow-up chemistries 8. Cholelithiasis/choledocholithiasis. Status post ERCP followed by laparoscopic cholecystectomy 06/01/2020 -pt still denies pain 9. Seizure disorder. Keppra 500 mg twice daily. EEG negative 10. Hypertension. Altace 5 mg daily, Coreg 12.5 mg twice daily. -1/9 SBP has been slightly labile but want to avoid hypoperfusion--- Vitals:   06/15/20 0611 06/15/20 0647  BP: (!) 193/70 (!) 165/69  Pulse: 73 70  Resp: 20   Temp:    SpO2: 100%   In desired range this am - avoid hypoperfusion 11. Diabetes mellitus. Hemoglobin A1c 8.7. Lantus insulin 8 units nightly---pt takes 16u at bedtime at home  CBG (last 3)  Recent Labs    06/14/20 1650 06/14/20 2054  06/15/20 0628  GLUCAP 127* 154* 90  good control this am on lantus 10U   12. Hyperlipidemia. Lipitor 13. History of colon cancer with resection 1998. Follow-up as outpatient  14.  Leukocytosis no fever monitor for now  15.  DOE- likely deconditioning plus anemia, will monitor in therapy , check CXR to look for pulm edema pulm HTN , off lasix 16,  ABLA- ask GI to re eval suspect GI blood loss although no bright red blood- transfuse given poor activity tolerance noted , son has noticed dark stools - appreciate GI note no clear cut signs of GIB- await post transfusion Hgb   GI suggested possible retroperitoneal bleed- will do non contrast CT abd/pelvis to eval  LOS: 5 days A FACE TO  FACE EVALUATION WAS PERFORMED  Charlett Blake 06/15/2020, 8:00 AM

## 2020-06-16 ENCOUNTER — Inpatient Hospital Stay (HOSPITAL_COMMUNITY): Payer: Medicare Other | Admitting: Physical Therapy

## 2020-06-16 ENCOUNTER — Inpatient Hospital Stay (HOSPITAL_COMMUNITY): Payer: Medicare Other

## 2020-06-16 ENCOUNTER — Inpatient Hospital Stay (HOSPITAL_COMMUNITY): Payer: Medicare Other | Admitting: Speech Pathology

## 2020-06-16 LAB — GLUCOSE, CAPILLARY
Glucose-Capillary: 96 mg/dL (ref 70–99)
Glucose-Capillary: 126 mg/dL — ABNORMAL HIGH (ref 70–99)
Glucose-Capillary: 205 mg/dL — ABNORMAL HIGH (ref 70–99)
Glucose-Capillary: 172 mg/dL — ABNORMAL HIGH (ref 70–99)

## 2020-06-16 LAB — CBC
HCT: 25.6 % — ABNORMAL LOW (ref 36.0–46.0)
Hemoglobin: 8.5 g/dL — ABNORMAL LOW (ref 12.0–15.0)
MCH: 32.3 pg (ref 26.0–34.0)
MCHC: 33.2 g/dL (ref 30.0–36.0)
MCV: 97.3 fL (ref 80.0–100.0)
Platelets: 183 10*3/uL (ref 150–400)
RBC: 2.63 MIL/uL — ABNORMAL LOW (ref 3.87–5.11)
RDW: 14.8 % (ref 11.5–15.5)
WBC: 10 10*3/uL (ref 4.0–10.5)
nRBC: 0 % (ref 0.0–0.2)

## 2020-06-16 MED ORDER — FUROSEMIDE 20 MG PO TABS
20.0000 mg | ORAL_TABLET | Freq: Every day | ORAL | Status: DC
  Administered 2020-06-16: 20 mg via ORAL
  Filled 2020-06-16: qty 1

## 2020-06-16 NOTE — Progress Notes (Signed)
Occupational Therapy Session Note  Patient Details  Name: Taylor Castaneda MRN: [id number] Date of Birth: 03-23-1933  Today's Date: 06/16/2020 OT Individual Time: 3317-4099 OT Individual Time Calculation (min): 58 min    Short Term Goals: Week 1:  OT Short Term Goal 1 (Week 1): STG=LTG d/t ELOS  Skilled Therapeutic Interventions/Progress Updates:    Pt received semi-reclined in bed, agreeable to therapy, son present. Reporting mild dizziness but denies pain. BP before activity:160/58 (87). RN aware and cleared pt for therapy. Session focus on toileting, dressing, activity tolerance.  Pt completed bed mobility with close S. Reporting increased dizziness BP upon supine > sitting EOB., BP read at 148/104 (199), recovered to 168/64 (92).   Overall sit<>stand with mod a for initial lift + max TCs to extend L knee. Noted difficulty coming into fully erect posture req multiple attempts to achieve,  Stand-pivot without AD to w/c then to toilet with min A to facilitate upright posture, continent void of bladder. Mod A for balance during anterior hygeine + clothing management. Doffed/donned shirt with distant S. Donned pants with max A to thread BLE, unable to achieve figure four position. Min A for sit<>stand at sink to pull over B hips. Donned B shoes with total A. Transported to gym in w/c 2/2 time management. With RW, completed 2 sit<>stands with min A for initial lift, able to stand for 3 minutes during dynamic standing balance activity with no BUE supported with CGA. Attempted standing marches, but pt unable to clear L foot from floor 2/2 decreased L knee flexion. Stand-pivot back to chair with min A. Reviewed OT POC and therapy recs of 24/7 supervision at time of DC with son, son aware of recs but unsure if they will be able to provide 24/7 at this time.  Pt left in chair with chair alarm engaged, call bell in reach, son present and all immediate needs met.    Therapy Documentation Precautions:   Precautions Precautions: Fall,Other (comment) Precaution Comments: monitor vitals with activity, hx of seizures Restrictions Weight Bearing Restrictions: No Pain: Pain Assessment Pain Scale: 0-10 Pain Score: 0-No pain ADL: See Care Tool for more details.  Therapy/Group: Individual Therapy  Volanda Napoleon, MS, OTR/L 06/16/2020, 8:19 AM

## 2020-06-16 NOTE — Progress Notes (Signed)
[place] PHYSICAL MEDICINE & REHABILITATION PROGRESS NOTE   Subjective/Complaints:  Reviewed CT scan, discussed BP with LPN LPN notes no c/o of sacral pain  Pt remebers having tailbone pain at home prior to admission  Appreciate GI consult    ROS: Patient denies CP, +SOB when getting OOB, no N/V/D   Objective:   CT ABDOMEN PELVIS WO CONTRAST  Result Date: 06/16/2020 CLINICAL DATA:  Anemia EXAM: CT ABDOMEN AND PELVIS WITHOUT CONTRAST TECHNIQUE: Multidetector CT imaging of the abdomen and pelvis was performed following the standard protocol without IV contrast. COMPARISON:  June 20, 2009 FINDINGS: Lower chest: There are large bilateral pleural effusions with adjacent compressive atelectasis.There is cardiomegaly. The intracardiac blood pool is hypodense relative to the adjacent myocardium consistent with anemia. Hepatobiliary: The liver is normal. Status post cholecystectomy.There is pneumobilia, likely related to prior sphincterotomy. Pancreas: Normal contours without ductal dilatation. No peripancreatic fluid collection. Spleen: Unremarkable. Adrenals/Urinary Tract: --Adrenal glands: Unremarkable. --Right kidney/ureter: No hydronephrosis or radiopaque kidney stones. --Left kidney/ureter: There is a large nonobstructing stone in upper pole measuring 1.9 cm. --Urinary bladder: Unremarkable. Stomach/Bowel: --Stomach/Duodenum: No hiatal hernia or other gastric abnormality. Normal duodenal course and caliber. --Small bowel: Unremarkable. --Colon: Unremarkable. --Appendix: Surgically absent. Vascular/Lymphatic: Atherosclerotic calcification is present within the non-aneurysmal abdominal aorta, without hemodynamically significant stenosis. --No retroperitoneal lymphadenopathy. --No mesenteric lymphadenopathy. --No pelvic or inguinal lymphadenopathy. Reproductive: Unremarkable Other: No ascites or free air. The abdominal wall is normal. Musculoskeletal. There is a subacute appearing fracture through  the is lower sacrum. IMPRESSION: 1. No acute abdominopelvic abnormality. 2. Subacute appearing fracture through the lower sacrum. 3. Large bilateral pleural effusions with adjacent compressive atelectasis. 4. Cardiomegaly. Aortic Atherosclerosis (ICD10-I70.0). Electronically Signed   By: Constance Holster M.D.   On: 06/16/2020 02:35   Recent Labs    06/15/20 0726 06/16/20 0456  WBC 10.0 10.0  HGB 8.9* 8.5*  HCT 28.4* 25.6*  PLT 172 183   No results for input(s): NA, K, CL, CO2, GLUCOSE, BUN, CREATININE, CALCIUM in the last 72 hours.  Intake/Output Summary (Last 24 hours) at 06/16/2020 9417 Last data filed at 06/15/2020 4081 Gross per 24 hour  Intake 340 ml  Output -  Net 340 ml     Pressure Injury 06/10/20 Coccyx Medial Stage 1 -  Intact skin with non-blanchable redness of a localized area usually over a bony prominence. (Active)  06/10/20 2200  Location: Coccyx  Location Orientation: Medial  Staging: Stage 1 -  Intact skin with non-blanchable redness of a localized area usually over a bony prominence.  Wound Description (Comments):   Present on Admission: Yes    Physical Exam: Vital Signs Blood pressure (!) 172/59, pulse 75, temperature 99 F (37.2 C), temperature source Oral, resp. rate 16, height 5' (1.524 m), weight 56.9 kg, SpO2 96 %.  General: No acute distress Mood and affect are appropriate Heart: Regular rate and rhythm no rubs murmurs or extra sounds Lungs: Clear to auscultation, breathing unlabored, no rales or wheezes Abdomen: Positive bowel sounds, soft nontender to palpation, nondistended Extremities: No clubbing, cyanosis, or edema Skin: No evidence of breakdown, no evidence of rash   Skin: right wrist wound with vaseline gauze and kerlix--drainage much improved Neuro: oriented x3, language more fluent by the day. Reasonable insight and awareness. RUE 4+/5 RLE 4+/5. LUE and LLE 4/5. No focal sensory deficits appreciated Musculoskeletal: no tenderness over  sacrum       Assessment/Plan: 1. Functional deficits which require 3+ hours per day of interdisciplinary therapy  in a comprehensive inpatient rehab setting.  Physiatrist is providing close team supervision and 24 hour management of active medical problems listed below.  Physiatrist and rehab team continue to assess barriers to discharge/monitor patient progress toward functional and medical goals  Care Tool:  Bathing    Body parts bathed by patient: Right arm,Left arm,Chest,Abdomen (UB only today)   Body parts bathed by helper: Buttocks,Right lower leg,Left lower leg     Bathing assist Assist Level: Supervision/Verbal cueing     Upper Body Dressing/Undressing Upper body dressing Upper body dressing/undressing activity did not occur (including orthotics): Safety/medical concerns What is the patient wearing?: Pull over shirt    Upper body assist Assist Level: Minimal Assistance - Patient > 75%    Lower Body Dressing/Undressing Lower body dressing      What is the patient wearing?: Incontinence brief,Pants     Lower body assist Assist for lower body dressing: Maximal Assistance - Patient 25 - 49%     Toileting Toileting    Toileting assist Assist for toileting: Minimal Assistance - Patient > 75%     Transfers Chair/bed transfer  Transfers assist     Chair/bed transfer assist level: Minimal Assistance - Patient > 75%     Locomotion Ambulation   Ambulation assist      Assist level: Minimal Assistance - Patient > 75% Assistive device: Hand held assist Max distance: 35ft   Walk 10 feet activity   Assist  Walk 10 feet activity did not occur:  (knee pain and s.o.b)  Assist level: Minimal Assistance - Patient > 75% Assistive device: Hand held assist   Walk 50 feet activity   Assist Walk 50 feet with 2 turns activity did not occur: Safety/medical concerns  Assist level: Minimal Assistance - Patient > 75% Assistive device: Hand held assist     Walk 150 feet activity   Assist Walk 150 feet activity did not occur: Safety/medical concerns         Walk 10 feet on uneven surface  activity   Assist Walk 10 feet on uneven surfaces activity did not occur: Safety/medical concerns         Wheelchair     Assist Will patient use wheelchair at discharge?: No             Wheelchair 50 feet with 2 turns activity    Assist            Wheelchair 150 feet activity     Assist          Blood pressure (!) 172/59, pulse 75, temperature 99 F (37.2 C), temperature source Oral, resp. rate 16, height 5' (1.524 m), weight 56.9 kg, SpO2 96 %.  Medical Problem List and Plan: 1.Decreased functional ability with aphasiasecondary to several punctate right centrum semiovale infarct likely due to severe intracranial stenosis -patient may Shower with right wrist covered. -ELOS/Goals: 7-10 days, Mod I with PT, OT and min assist with SLP  -may resume full therapy , as discussed with PT check ortho vitals 2. Antithrombotics: -DVT/anticoagulation:Subcutaneous heparin switched to lovenox- will hold givne severe anemia  -antiplatelet therapy: Aspirin 325 mg daily and Plavix 75 mg daily x3 months then Plavix alone 3. Pain Management:Tramadol as needed, chronic sacral fx asymptomatic  4. Mood:Provide emotional support -antipsychotic agents: N/A 5. Neuropsych: This patientiscapable of making decisions on herown behalf. 6. Skin/Wound Care:Routine skin checks 7. Fluids/Electrolytes/Nutrition:Routine in and outs with follow-up chemistries BMET Monday , monitor K+ 8. Cholelithiasis/choledocholithiasis. Status post ERCP followed by  laparoscopic cholecystectomy 06/01/2020 -pt still denies pain 9. Seizure disorder. Keppra 500 mg twice daily. EEG negative 10. Hypertension. Altace 5 mg daily, Coreg 12.5 mg twice daily. -1/9 SBP has been  slightly labile but want to avoid hypoperfusion--- Vitals:   06/15/20 2343 06/16/20 0508  BP: (!) 182/76 (!) 172/59  Pulse:  75  Resp:  16  Temp:  99 F (37.2 C)  SpO2:  96%  change furosemide to qam, may need to increase dose  11. Diabetes mellitus. Hemoglobin A1c 8.7. Lantus insulin 8 units nightly---pt takes 16u at bedtime at home  CBG (last 3)  Recent Labs    06/15/20 1632 06/15/20 2101 06/16/20 0556  GLUCAP 194* 194* 96  good control this am on lantus 10U   12. Hyperlipidemia. Lipitor 13. History of colon cancer with resection 1998. Follow-up as outpatient  14.  Leukocytosis no fever monitor for now  15.  DOE- likely deconditioning plus anemia, will monitor in therapy , check CXR to look for pulm edema pulm HTN , off lasix 16,  ABLA- ask GI to re eval suspect GI blood loss although no bright red blood- transfuse given poor activity tolerance noted , son has noticed dark stools - appreciate GI note no clear cut signs of GIB- await post transfusion Hgb   GI suggested possible retroperitoneal bleed- no evidence of bleed  ? If  LOS: 6 days A FACE TO FACE EVALUATION WAS PERFORMED  Charlett Blake 06/16/2020, 8:08 AM

## 2020-06-16 NOTE — Progress Notes (Signed)
Physical Therapy Session Note  Patient Details  Name: LUCRECIA MCPHEARSON MRN: [id number] Date of Birth: 02-06-1933  Today's Date: 06/16/2020 PT Individual Time: 7169-6789 PT Individual Time Calculation (min): 62 min   Short Term Goals: Week 1:  PT Short Term Goal 1 (Week 1): = to LTGs based on ELOS  Skilled Therapeutic Interventions/Progress Updates:    Patient seated upright in recliner upon PT arrival. Son present in room. Patient alert and agreeable to PT session. Patient denied pain throughout session.  Therapeutic Activity: Transfers: Patient performed sit to/from stand throughout session with CGA requiring vc for forward scoot, anterior weight shifting, anterior push from armrests for improved rise to stand technique and vc/tc for safe hand progression in both rise and descent to chair with vc for slow descent. She improves in self hand placement as session progressed.   Gait Training:  Patient ambulated 41' x1/ 89' x1/ 45' x1 using RW and initially requiring Min A and vc/ tc for increasing LLE lift and swing through as pt tending to drag L leg in step-to progression. With multimodal cueing for increasing hip and knee flexion for LLE progression, she is able to improve technique and ambulates longer distances later in session with improved step-through gait pattern, foot clearance, and heel strike.   Neuromuscular Re-ed: NMR facilitated throughout session with focus on standing static and pre-dynamic balance using BITS to facilitate reaching in and far outside BOS from waist to overhead heights. She completes target touching activity covering full field at hits 20 targets for both bouts. First bout completed with 75% accuracy in 44min 28sec. Second bout completed with 80.65% accuracy within 33min 8sec. Third activity wih wide array of numbers and requiring sequencing of pt for numbers 1-20. She completes with 62.5% accruacy within 53min 3sec. Pt tolerated activity well requiring minimal physical  assist and vc for dynamic stepping to improve reach for targets outside of static reach.   Patient seated in recliner at end of session with brakes locked, chair alarm set, and all needs within reach.    Therapy Documentation Precautions:  Precautions Precautions: Fall,Other (comment) Precaution Comments: monitor vitals with activity, hx of seizures Restrictions Weight Bearing Restrictions: No  Therapy/Group: Individual Therapy  Alger Simons 06/16/2020, 1:35 PM

## 2020-06-16 NOTE — Progress Notes (Addendum)
Daily Rounding Note  06/16/2020, 11:43 AM  LOS: 6 days   SUBJECTIVE:   Chief complaint:    Anemia, drifting Hgb.  Feels well.  Appetite variable.  Last stool was formed, dark yesterday afternoon. Chronically dark stools. No N/V   OBJECTIVE:         Vital signs in last 24 hours:    Temp:  [98.1 F (36.7 C)-99 F (37.2 C)] 99 F (37.2 C) (01/13 0508) Pulse Rate:  [72-75] 75 (01/13 0508) Resp:  [16-17] 16 (01/13 0508) BP: (123-187)/(57-76) 172/59 (01/13 0508) SpO2:  [96 %-100 %] 96 % (01/13 0508) Weight:  [56.9 kg] 56.9 kg (01/12 1500) Last BM Date: 06/15/20 Filed Weights   06/10/20 1535 06/15/20 1500  Weight: 56.5 kg 56.9 kg   General: pleasant, aged, moderately frail.  Pale.  Comfortable, alert   Heart: RRR Chest: clear bil.  No labored breathing Abdomen: soft, NT, ND.  Active BS.  Intact, healiing lap chole incisions.    Extremities: no CCE.   Neuro/Psych:  Oriented x 3.  Appropriate.     Recent Labs    06/14/20 0535 06/15/20 0726 06/16/20 0456  WBC 10.4 10.0 10.0  HGB 6.5* 8.9* 8.5*  HCT 20.5* 28.4* 25.6*  PLT 162 172 183    CT ABDOMEN PELVIS WO CONTRAST  Result Date: 06/16/2020 CLINICAL DATA:  Anemia EXAM: CT ABDOMEN AND PELVIS WITHOUT CONTRAST TECHNIQUE: Multidetector CT imaging of the abdomen and pelvis was performed following the standard protocol without IV contrast. COMPARISON:  June 20, 2009 FINDINGS: Lower chest: There are large bilateral pleural effusions with adjacent compressive atelectasis.There is cardiomegaly. The intracardiac blood pool is hypodense relative to the adjacent myocardium consistent with anemia. Hepatobiliary: The liver is normal. Status post cholecystectomy.There is pneumobilia, likely related to prior sphincterotomy. Pancreas: Normal contours without ductal dilatation. No peripancreatic fluid collection. Spleen: Unremarkable. Adrenals/Urinary Tract: --Adrenal glands:  Unremarkable. --Right kidney/ureter: No hydronephrosis or radiopaque kidney stones. --Left kidney/ureter: There is a large nonobstructing stone in upper pole measuring 1.9 cm. --Urinary bladder: Unremarkable. Stomach/Bowel: --Stomach/Duodenum: No hiatal hernia or other gastric abnormality. Normal duodenal course and caliber. --Small bowel: Unremarkable. --Colon: Unremarkable. --Appendix: Surgically absent. Vascular/Lymphatic: Atherosclerotic calcification is present within the non-aneurysmal abdominal aorta, without hemodynamically significant stenosis. --No retroperitoneal lymphadenopathy. --No mesenteric lymphadenopathy. --No pelvic or inguinal lymphadenopathy. Reproductive: Unremarkable Other: No ascites or free air. The abdominal wall is normal. Musculoskeletal. There is a subacute appearing fracture through the is lower sacrum. IMPRESSION: 1. No acute abdominopelvic abnormality. 2. Subacute appearing fracture through the lower sacrum. 3. Large bilateral pleural effusions with adjacent compressive atelectasis. 4. Cardiomegaly. Aortic Atherosclerosis (ICD10-I70.0). Electronically Signed   By: Constance Holster M.D.   On: 06/16/2020 02:35   Scheduled Meds: . aspirin EC  325 mg Oral Daily  . atorvastatin  40 mg Oral Daily  . carvedilol  12.5 mg Oral BID  . clopidogrel  75 mg Oral Daily  . diclofenac Sodium  2 g Topical QID  . feeding supplement  237 mL Oral Q1500  . furosemide  20 mg Oral Daily  . insulin aspart  0-9 Units Subcutaneous TID WC  . insulin glargine  10 Units Subcutaneous QHS  . levETIRAcetam  500 mg Oral BID  . pantoprazole  40 mg Oral BID  . ramipril  5 mg Oral Daily   Continuous Infusions: PRN Meds:.acetaminophen, albuterol, ondansetron **OR** ondansetron (ZOFRAN) IV, polyvinyl alcohol, traMADol   ASSESMENT:   *  Anemia.  Macrocytic.  Not iron, B12, folate deficient by labs of 06/06/2020. Hgb 6.5 >> 1 PRBC >> 8.5.    Though not ideal imaging modality for evaluation of varices  and mucosal changes, at 05/31/20 ERCP w side viewing scope mild gastritis (mild reactive gastropathy, no H Pylori) noted but no varices, portal gastropathy, ulcers noted On Protonix 40 mg po bid continues.    CTAP w no evidence internal bleeding or RP hematoma.    *     Chronically dark stools.  Reliable marker for GI bleeding.  *    Choledocholithiasis, elevated LFTs 05/2020.  05/31/20 ERCP with sphincterotomy and removal of stones 12/29 lap chole.    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   *    Bil pleural effusions per CT. Pt has no SOB.    *   Colon cancer resected 1998.  Colonoscopy 2015, age 14: HP polyp removed and healthy-appearing ileocolonic anastomosis, nonbleeding internal hemorrhoids.  No plans for future polyp/cancer surveillance colonoscopy.    *   Cirrhosis of the liver, new diagnosis based on MRI, ultrasound late 05/2020.  *   CKD, mild.     PLAN   *   Per Dr Ardis Hughs who will follow later today.    *   Protonix 40 bid for 4 weeks, then 40 mg per day.  Given she will be on chronic DOAC, would leave daily PPI in place unless contraindicated.     Azucena Freed  06/16/2020, 11:43 AM Phone [phone]  ________________________________________________________________________  Velora Heckler GI MD note:  I personally examined the patient, reviewed the data and agree with the assessment and plan described above.  Her Hb remains stable.  OK to continue plavix and ASA as long as she stays on PPI BID.  NO plans for invasive GI testing given her stroke following ERCP/lap chole 2 weeks ago.  Signing off for now, please call if she has overt signficant GI bleeding.  If that occurs then the benefits of EGD will probably outweigh the risks.   Owens Loffler, MD [facility] Gastroenter[facility] Pager [phone]

## 2020-06-16 NOTE — Progress Notes (Signed)
Speech Language Pathology Daily Session Note  Patient Details  Name: Taylor Castaneda MRN: [id number] Date of Birth: 1933-05-19  Today's Date: 06/16/2020 SLP Individual Time: 1000-1055 SLP Individual Time Calculation (min): 55 min  Short Term Goals: Week 1: SLP Short Term Goal 1 (Week 1): Pt will follow basic 2-3 step directions with 80% accuracy provided Min A verbal/visual cues. SLP Short Term Goal 2 (Week 1): Pt will demonstrate ability to use word-finding strategies in functional conversation and other targeted language tasks with Supervision A cues. SLP Short Term Goal 3 (Week 1): Pt will demonstrate ability to problem solve basic familiar functional situations with Min A verbal/visual cueing. SLP Short Term Goal 4 (Week 1): Pt will increase short term recall of functional information with use of compensatory strategies to 70% acc with min A verbal cues  Skilled Therapeutic Interventions: Pt was seen for skilled ST targeting cognitive-linguistic goals. SLP facilitated session with a basic grocery list making and search task. Pt created grocery list with set up and Min A verbal/visual cues for organization. She located items within the ad with Min A for problem solving, Mod A for recall within task and to alternate attention between list and ad. Pt also completed a basic 3-step action card sequencing task with overall Min A for functional problem solving, despite her verbal problem solving only requiring Supervision A verbal cues for clarification. Overall Min A semantic cues provided for fluency and word finding when describing the steps within pictures during the picture sequencing task. When dual tasking, pt requires more cueing for word finding and fluency in comparison to simple conversation without any extra cognitive demands. Discussed progress with her son at end of session. Pt left sitting in recliner with seat alarm in place and needs within reach. Continue per current plan of care.         Pain Pain Assessment Pain Scale: 0-10 Pain Score: 0-No pain  Therapy/Group: Individual Therapy  Arbutus Leas 06/16/2020, 7:26 AM

## 2020-06-17 ENCOUNTER — Inpatient Hospital Stay (HOSPITAL_COMMUNITY): Payer: Medicare Other | Admitting: Physical Therapy

## 2020-06-17 ENCOUNTER — Inpatient Hospital Stay (HOSPITAL_COMMUNITY): Payer: Medicare Other | Admitting: Speech Pathology

## 2020-06-17 ENCOUNTER — Inpatient Hospital Stay (HOSPITAL_COMMUNITY): Payer: Medicare Other

## 2020-06-17 LAB — GLUCOSE, CAPILLARY
Glucose-Capillary: 102 mg/dL — ABNORMAL HIGH (ref 70–99)
Glucose-Capillary: 133 mg/dL — ABNORMAL HIGH (ref 70–99)
Glucose-Capillary: 189 mg/dL — ABNORMAL HIGH (ref 70–99)
Glucose-Capillary: 193 mg/dL — ABNORMAL HIGH (ref 70–99)

## 2020-06-17 MED ORDER — FUROSEMIDE 40 MG PO TABS
40.0000 mg | ORAL_TABLET | Freq: Every day | ORAL | Status: DC
  Administered 2020-06-18 – 2020-06-21 (×4): 40 mg via ORAL
  Filled 2020-06-17 (×5): qty 1

## 2020-06-17 NOTE — Progress Notes (Signed)
Physical Therapy Session Note  Patient Details  Name: Taylor Castaneda MRN: [id number] Date of Birth: 17-Feb-1933  Today's Date: 06/17/2020 PT Individual Time: 0900-1000 PT Individual Time Calculation (min): 60 min   Short Term Goals: Week 1:  PT Short Term Goal 1 (Week 1): = to LTGs based on ELOS  Skilled Therapeutic Interventions/Progress Updates:   Pt received supine in bed and agreeable to PT. Supine>sit transfer with supervision assist and increased time. Stand pivot transfer to [facility] with RW and min assist. Pt noted to have severe inattention to the LLE requiring max cues for initiation of step with the LLE as well as cues for safety to reach back for WC in transition to sitting.   Pt transported to rehab gym in [facility]. Gait training with RW 2 x 11f +676fwith min-CGA for improved pelvic rotation, posture and min-mod cues for improve step length on the L. Improved step length and reciprocal pattern with increased distance on each bout of gait training. Dynamic gait training to perform forward/reverse gait 2 x 1074fith min assist. Noted to have mild posterior LOB on first bout, but improved reciprocal gait pattern in reverse.   dynanmic standing balance to place and remove clothes pins from horizontal bars from REd wedge with mod assist and UE support on RW x 10 BUE. Pt noted to have absent ankle strategy to correct balance with heavy posterior bias throughout, attempting to use UE support to pull on RW to correct LOB. Pt then perform reaches from airex pad x 10 BUE and min assist for improved anterior weight shift. Additional forward/cross body reaches from red wedge in parallel bars x 10 BUE with min assist to increased anterior weight shift. Noted to have improved WB through forefoot in parallel bars but increased use of UE required.   Sit<>stand throughout session completed with supervision assist and min cues for proper UE placement to push from WC [facility] well as improved positioning in front of WC  prior to descent. Patient returned to room and left sitting in recliner with call bell in reach and all needs met.           Therapy Documentation Precautions:  Precautions Precautions: Fall,Other (comment) Precaution Comments: monitor vitals with activity, hx of seizures Restrictions Weight Bearing Restrictions: No Pain: Pain Assessment Pain Scale: 0-10 Pain Score: 0-No pain    Therapy/Group: Individual Therapy  AusLorie Phenix14/2022, 11:25 AM

## 2020-06-17 NOTE — Progress Notes (Signed)
[place] PHYSICAL MEDICINE & REHABILITATION PROGRESS NOTE   Subjective/Complaints:  Working on apraxia cards with SLP  Appreciate GI consult    ROS: Patient denies CP, +SOB when getting OOB, no N/V/D   Objective:   CT ABDOMEN PELVIS WO CONTRAST  Result Date: 06/16/2020 CLINICAL DATA:  Anemia EXAM: CT ABDOMEN AND PELVIS WITHOUT CONTRAST TECHNIQUE: Multidetector CT imaging of the abdomen and pelvis was performed following the standard protocol without IV contrast. COMPARISON:  June 20, 2009 FINDINGS: Lower chest: There are large bilateral pleural effusions with adjacent compressive atelectasis.There is cardiomegaly. The intracardiac blood pool is hypodense relative to the adjacent myocardium consistent with anemia. Hepatobiliary: The liver is normal. Status post cholecystectomy.There is pneumobilia, likely related to prior sphincterotomy. Pancreas: Normal contours without ductal dilatation. No peripancreatic fluid collection. Spleen: Unremarkable. Adrenals/Urinary Tract: --Adrenal glands: Unremarkable. --Right kidney/ureter: No hydronephrosis or radiopaque kidney stones. --Left kidney/ureter: There is a large nonobstructing stone in upper pole measuring 1.9 cm. --Urinary bladder: Unremarkable. Stomach/Bowel: --Stomach/Duodenum: No hiatal hernia or other gastric abnormality. Normal duodenal course and caliber. --Small bowel: Unremarkable. --Colon: Unremarkable. --Appendix: Surgically absent. Vascular/Lymphatic: Atherosclerotic calcification is present within the non-aneurysmal abdominal aorta, without hemodynamically significant stenosis. --No retroperitoneal lymphadenopathy. --No mesenteric lymphadenopathy. --No pelvic or inguinal lymphadenopathy. Reproductive: Unremarkable Other: No ascites or free air. The abdominal wall is normal. Musculoskeletal. There is a subacute appearing fracture through the is lower sacrum. IMPRESSION: 1. No acute abdominopelvic abnormality. 2. Subacute appearing  fracture through the lower sacrum. 3. Large bilateral pleural effusions with adjacent compressive atelectasis. 4. Cardiomegaly. Aortic Atherosclerosis (ICD10-I70.0). Electronically Signed   By: Constance Holster M.D.   On: 06/16/2020 02:35   Recent Labs    06/15/20 0726 06/16/20 0456  WBC 10.0 10.0  HGB 8.9* 8.5*  HCT 28.4* 25.6*  PLT 172 183   No results for input(s): NA, K, CL, CO2, GLUCOSE, BUN, CREATININE, CALCIUM in the last 72 hours.  Intake/Output Summary (Last 24 hours) at 06/17/2020 0824 Last data filed at 06/16/2020 1850 Gross per 24 hour  Intake 240 ml  Output -  Net 240 ml     Pressure Injury 06/10/20 Coccyx Medial Stage 1 -  Intact skin with non-blanchable redness of a localized area usually over a bony prominence. (Active)  06/10/20 2200  Location: Coccyx  Location Orientation: Medial  Staging: Stage 1 -  Intact skin with non-blanchable redness of a localized area usually over a bony prominence.  Wound Description (Comments):   Present on Admission: Yes    Physical Exam: Vital Signs Blood pressure (!) 158/93, pulse 73, temperature 98 F (36.7 C), temperature source Oral, resp. rate 20, height 5' (1.524 m), weight 56.9 kg, SpO2 97 %.  General: No acute distress Mood and affect are appropriate Heart: Regular rate and rhythm no rubs murmurs or extra sounds Lungs: Clear to auscultation, breathing unlabored, no rales or wheezes Abdomen: Positive bowel sounds, soft nontender to palpation, nondistended Extremities: No clubbing, cyanosis, or edema Skin: No evidence of breakdown, no evidence of rash   Skin: right wrist wound with vaseline gauze and kerlix--drainage much improved Neuro: oriented x3, language more fluent by the day. Reasonable insight and awareness. RUE 4+/5 RLE 4+/5. LUE and LLE 4/5. No focal sensory deficits appreciated Musculoskeletal: no tenderness over sacrum       Assessment/Plan: 1. Functional deficits which require 3+ hours per day of  interdisciplinary therapy in a comprehensive inpatient rehab setting.  Physiatrist is providing close team supervision and 24 hour management of active  medical problems listed below.  Physiatrist and rehab team continue to assess barriers to discharge/monitor patient progress toward functional and medical goals  Care Tool:  Bathing    Body parts bathed by patient: Right arm,Left arm,Chest,Abdomen (UB only today)   Body parts bathed by helper: Buttocks,Right lower leg,Left lower leg     Bathing assist Assist Level: Supervision/Verbal cueing     Upper Body Dressing/Undressing Upper body dressing Upper body dressing/undressing activity did not occur (including orthotics): Safety/medical concerns What is the patient wearing?: Pull over shirt    Upper body assist Assist Level: Minimal Assistance - Patient > 75%    Lower Body Dressing/Undressing Lower body dressing      What is the patient wearing?: Pants     Lower body assist Assist for lower body dressing: Maximal Assistance - Patient 25 - 49%     Toileting Toileting    Toileting assist Assist for toileting: Moderate Assistance - Patient 50 - 74%     Transfers Chair/bed transfer  Transfers assist     Chair/bed transfer assist level: Moderate Assistance - Patient 50 - 74%     Locomotion Ambulation   Ambulation assist      Assist level: Minimal Assistance - Patient > 75% Assistive device: Walker-rolling Max distance: 26ft   Walk 10 feet activity   Assist  Walk 10 feet activity did not occur:  (knee pain and s.o.b)  Assist level: Contact Guard/Touching assist Assistive device: Walker-rolling   Walk 50 feet activity   Assist Walk 50 feet with 2 turns activity did not occur: Safety/medical concerns  Assist level: Minimal Assistance - Patient > 75% Assistive device: Walker-rolling    Walk 150 feet activity   Assist Walk 150 feet activity did not occur: Safety/medical concerns         Walk  10 feet on uneven surface  activity   Assist Walk 10 feet on uneven surfaces activity did not occur: Safety/medical concerns         Wheelchair     Assist Will patient use wheelchair at discharge?: No             Wheelchair 50 feet with 2 turns activity    Assist            Wheelchair 150 feet activity     Assist          Blood pressure (!) 158/93, pulse 73, temperature 98 F (36.7 C), temperature source Oral, resp. rate 20, height 5' (1.524 m), weight 56.9 kg, SpO2 97 %.  Medical Problem List and Plan: 1.Decreased functional ability with aphasiasecondary to several punctate right centrum semiovale infarct likely due to severe intracranial stenosis -patient may Shower with right wrist covered. -ELOS/Goals: 7-10 days, Mod I with PT, OT and min assist with SLP  -may resume full therapy , as discussed with PT check ortho vitals 2. Antithrombotics: -DVT/anticoagulation:Subcutaneous heparin switched to lovenox- will hold givne severe anemia  -antiplatelet therapy: Aspirin 325 mg daily and Plavix 75 mg daily x3 months then Plavix alone 3. Pain Management:Tramadol as needed, chronic sacral fx asymptomatic  4. Mood:Provide emotional support -antipsychotic agents: N/A 5. Neuropsych: This patientiscapable of making decisions on herown behalf. 6. Skin/Wound Care:Routine skin checks 7. Fluids/Electrolytes/Nutrition:Routine in and outs with follow-up chemistries BMET Monday , monitor K+ 8. Cholelithiasis/choledocholithiasis. Status post ERCP followed by laparoscopic cholecystectomy 06/01/2020 -pt still denies pain 9. Seizure disorder. Keppra 500 mg twice daily. EEG negative 10. Hypertension. Altace 5 mg daily, Coreg 12.5 mg twice  daily. -1/9 SBP has been slightly labile but want to avoid hypoperfusion--- Vitals:   06/16/20 2100 06/17/20 0459  BP: (!) 156/41 (!) 158/93  Pulse:  72 73  Resp:  20  Temp:  98 F (36.7 C)  SpO2:  97%  change furosemide to qam, may need to increase dose  11. Diabetes mellitus. Hemoglobin A1c 8.7. Lantus insulin 8 units nightly---pt takes 16u at bedtime at home  CBG (last 3)  Recent Labs    06/16/20 1639 06/16/20 2116 06/17/20 0558  GLUCAP 205* 172* 102*  good control this am on lantus 10U   12. Hyperlipidemia. Lipitor 13. History of colon cancer with resection 1998. Follow-up as outpatient  14.  Leukocytosis no fever monitor for now  15.  DOE- likely deconditioning plus anemia, will monitor in therapy , check CXR to look for pulm edema pulm HTN , off lasix 16,  ABLA- ask GI to re eval suspect GI blood loss although no bright red blood- transfuse given poor activity tolerance noted , son has noticed dark stools - appreciate GI note no clear cut signs of GIB- await post transfusion Hgb   GI suggested possible retroperitoneal bleed- no evidence of bleed  ? If  LOS: 7 days A FACE TO FACE EVALUATION WAS PERFORMED  Charlett Blake 06/17/2020, 8:24 AM

## 2020-06-17 NOTE — Progress Notes (Signed)
Occupational Therapy Weekly Progress Note  Patient Details  Name: Taylor Castaneda MRN: [id number] Date of Birth: Oct 30, 1932  Beginning of progress report period: June 11, 2020 End of progress report period: June 17, 2020  Today's Date: 06/17/2020 OT Individual Time: 6073-7106 OT Individual Time Calculation (min): 56 min    Patient does not have STG 2/2 ELOS.   Patient continues to demonstrate the following deficits: muscle weakness, decreased cardiorespiratoy endurance, impaired timing and sequencing, unbalanced muscle activation and decreased coordination and decreased awareness, decreased problem solving, decreased safety awareness and decreased memory and therefore will continue to benefit from skilled OT intervention to enhance overall performance with BADL and Reduce care partner burden.  Patient progressing toward long term goals..  Continue plan of care.  OT Short Term Goals Week 2:  OT Short Term Goal 1 (Week 2): STG=LTG d/t ELOS  Skilled Therapeutic Interventions/Progress Updates:    Pt received in chair with son present, agreeable to therapy. Session focus on LB dressing, seated grooming, and standing activity tolerance.  Pt donned/doffed B socks with close S, able to complete figure four positioned. Doffed B shoes with close S, donned with min A to adjust feet in shoes. Donned pants with min A for sit<>stand with RW. Req assist for initial lift and to achieve fully upright posture. Noted SOB after donning pants, HR read at 77 bpm, satO2: 99%. Seated, washed and combed hair with set-up A. Transported to kitchen in w/c 2/2 time, stood at counter with RW to mix ingredients for pudding with CGA for balance. Able to stand with BUE unsupported for total of ~15 min with seated rest break half way through. Transported back to room, amb ~20 ft to chair with RW + CGA + min VCs for LLE advancement, noted difficulty clearing L foot from floor and uneven step length. Discussed available DME  with pt/son, son will check to see if pt has shower chair or bench and if it has a back/arm rests.  Pt left in chair with chair alarm engaged, call bell in reach, son present and all immediate needs met.    Therapy Documentation Precautions:  Precautions Precautions: Fall,Other (comment) Precaution Comments: monitor vitals with activity, hx of seizures Restrictions Weight Bearing Restrictions: No  Pain: Pain Assessment Pain Scale: 0-10 Pain Score: 0-No pain ADL: See Care Tool for more details.    Therapy/Group: Individual Therapy  Volanda Napoleon, MS, OTR/L 06/17/2020, 11:41 AM

## 2020-06-17 NOTE — Discharge Instructions (Signed)
STROKE/TIA DISCHARGE INSTRUCTIONS SMOKING Cigarette smoking nearly doubles your risk of having a stroke & is the single most alterable risk factor  If you smoke or have smoked in the last 12 months, you are advised to quit smoking for your health.  Most of the excess cardiovascular risk related to smoking disappears within a year of stopping.  Ask you doctor about anti-smoking medications  [place] Quit Line: 1-800-QUIT NOW  Free Smoking Cessation Classes [phone]  CHOLESTEROL Know your levels; limit fat & cholesterol in your diet  Lipid Panel     Component Value Date/Time   CHOL 118 02/12/2020 0759   TRIG 127.0 02/12/2020 0759   HDL 32.30 (L) 02/12/2020 0759   CHOLHDL 4 02/12/2020 0759   VLDL 25.4 02/12/2020 0759   LDLCALC 60 02/12/2020 0759      Many patients benefit from treatment even if their cholesterol is at goal.  Goal: Total Cholesterol (CHOL) less than 160  Goal:  Triglycerides (TRIG) less than 150  Goal:  HDL greater than 40  Goal:  LDL (LDLCALC) less than 100   BLOOD PRESSURE American Stroke Association blood pressure target is less that 120/80 mm/Hg  Your discharge blood pressure is:  BP: (!) 140/55  Monitor your blood pressure  Limit your salt and alcohol intake  Many individuals will require more than one medication for high blood pressure  DIABETES (A1c is a blood sugar average for last 3 months) Goal HGBA1c is under 7% (HBGA1c is blood sugar average for last 3 months)  Diabetes:     Lab Results  Component Value Date   HGBA1C 7.9 (H) 06/02/2020     Your HGBA1c can be lowered with medications, healthy diet, and exercise.  Check your blood sugar as directed by your physician  Call your physician if you experience unexplained or low blood sugars.  PHYSICAL ACTIVITY/REHABILITATION Goal is 30 minutes at least 4 days per week  Activity: Increase activity slowly, Therapies: Physical Therapy: Home Health Return to work:   Activity decreases your risk  of heart attack and stroke and makes your heart stronger.  It helps control your weight and blood pressure; helps you relax and can improve your mood.  Participate in a regular exercise program.  Talk with your doctor about the best form of exercise for you (dancing, walking, swimming, cycling).  DIET/WEIGHT Goal is to maintain a healthy weight  Your discharge diet is:  Diet Order            Diet Carb Modified Fluid consistency: Thin; Room service appropriate? Yes  Diet effective now                 liquids Your height is:  Height: 5' (152.4 cm) Your current weight is: Weight: 56.5 kg Your Body Mass Index (BMI) is:  BMI (Calculated): 24.33  Following the type of diet specifically designed for you will help prevent another stroke.  Your goal weight range is:    Your goal Body Mass Index (BMI) is 19-24.  Healthy food habits can help reduce 3 risk factors for stroke:  High cholesterol, hypertension, and excess weight.  RESOURCES Stroke/Support Group:  Call [phone]   STROKE EDUCATION PROVIDED/REVIEWED AND GIVEN TO PATIENT Stroke warning signs and symptoms How to activate emergency medical system (call 911). Medications prescribed at discharge. Need for follow-up after discharge. Personal risk factors for stroke. Pneumonia vaccine given:  Flu vaccine given:  My questions have been answered, the writing is legible, and I understand these  instructions.  I will adhere to these goals & educational materials that have been provided to me after my discharge from the hospital.   Inpatient Rehab Discharge Instructions  Taylor Castaneda Discharge date and time: No discharge date for patient encounter.   Activities/Precautions/ Functional Status: Activity: As tolerated Diet: Diabetic diet Wound Care: Routine skin checks Functional status:  ___ No restrictions     ___ Walk up steps independently ___ 24/7 supervision/assistance   ___ Walk up steps with assistance ___ Intermittent  supervision/assistance  ___ Bathe/dress independently ___ Walk with walker     _x__ Bathe/dress with assistance ___ Walk Independently    ___ Shower independently ___ Walk with assistance    ___ Shower with assistance ___ No alcohol     ___ Return to work/school ________  COMMUNITY REFERRALS UPON DISCHARGE:    Home Health:   PT     OT     ST                  Agency: [facility] Phone: [phone]    Special Instructions: Please allow 24-72 hours for home health agency scheduling.  No driving smoking or alcohol  Aspirin 325 mg daily and Plavix 75 mg daily x3 months then Plavix alone   My questions have been answered and I understand these instructions. I will adhere to these goals and the provided educational materials after my discharge from the hospital.  Patient/Caregiver Signature _______________________________ Date __________  Clinician Signature _______________________________________ Date __________  Please bring this form and your medication list with you to all your follow-up doctor's appointments.

## 2020-06-17 NOTE — Progress Notes (Signed)
Patient ID: Taylor Castaneda, female   DOB: May 18, 1933, 87 y.o.   MRN: [id number]   Patient accepted by [facility] for follow up PT/OT/SPH. Orders emailed. NO DME recommendations currently.   [place], [place]

## 2020-06-17 NOTE — Progress Notes (Signed)
Speech Language Pathology Weekly Progress and Session Note  Patient Details  Name: Taylor Castaneda MRN: [id number] Date of Birth: 01/26/1933  Beginning of progress report period: June 10, 2020 End of progress report period: June 17, 2020  Today's Date: 06/17/2020 SLP Individual Time: 1281-1886 SLP Individual Time Calculation (min): 56 min  Short Term Goals: Week 1: SLP Short Term Goal 1 (Week 1): Pt will follow basic 2-3 step directions with 80% accuracy provided Min A verbal/visual cues. SLP Short Term Goal 1 - Progress (Week 1): Met SLP Short Term Goal 2 (Week 1): Pt will demonstrate ability to use word-finding strategies in functional conversation and other targeted language tasks with Supervision A cues. SLP Short Term Goal 2 - Progress (Week 1): Progressing toward goal SLP Short Term Goal 3 (Week 1): Pt will demonstrate ability to problem solve basic familiar functional situations with Min A verbal/visual cueing. SLP Short Term Goal 3 - Progress (Week 1): Progressing toward goal SLP Short Term Goal 4 (Week 1): Pt will increase short term recall of functional information with use of compensatory strategies to 70% acc with min A verbal cues SLP Short Term Goal 4 - Progress (Week 1): Met    New Short Term Goals: Week 2: SLP Short Term Goal 1 (Week 2): STG=LTG due to remaining length of stay  Weekly Progress Updates: Pt has made functional gains and met 2 out of 4 short term goals this reporting period. Pt is currently Min-Mod assist for basic familiar tasks and communication due to mild aphasia characterized by intermittently halting, effortful speech and word finding difficulties, as well as cognitive deficits impacting primarily her short term and working memory, problem solving. Pt has demonstrated improved use of compensatory word finding strategies, speech fluency, and basic familiar problem solving. Pt and family education is ongoing. Pt would continue to benefit from skilled ST  while inpatient in order to maximize functional independence and reduce burden of care prior to discharge. Anticipate that pt will need 24/7 supervision at discharge in addition to [place] follow up at next level of care.     Intensity: Minumum of 1-2 x/day, 30 to 90 minutes Frequency: 3 to 5 out of 7 days Duration/Length of Stay: 06/24/20 Treatment/Interventions: Cognitive remediation/compensation;Cueing hierarchy;Functional tasks;Therapeutic Activities;Patient/family education;Internal/external aids   Daily Session  Skilled Therapeutic Interventions: Pt was seen for skilled ST targeting cognitive-linguistic goals. SLP facilitated session with picture description and medication management tasks. Pt required Mod A verbal and visual cueing to interpret information from medication labels, problem solve organization of a QID pill chair with basic medication scenarios, and also to anticipate when she would take certain medications given where they were placed on the pill chart. Discussed need for full assistance with med management at home, in order to recall when it's time to take medications and accurate administration. Pt in agreement. She also required Min A semantic cues for word finding when describing actions, items, and relationships within familiar picture scenes. Pt left sitting in bed with alarm set and needs within reach. Continue per current plan of care.          Pain Pain Assessment Pain Scale: 0-10 Pain Score: 0-No pain  Therapy/Group: Individual Therapy  Arbutus Leas 06/17/2020, 9:53 AM

## 2020-06-18 ENCOUNTER — Inpatient Hospital Stay (HOSPITAL_COMMUNITY): Payer: Medicare Other | Admitting: Physical Therapy

## 2020-06-18 LAB — GLUCOSE, CAPILLARY
Glucose-Capillary: 97 mg/dL (ref 70–99)
Glucose-Capillary: 93 mg/dL (ref 70–99)
Glucose-Capillary: 152 mg/dL — ABNORMAL HIGH (ref 70–99)

## 2020-06-18 NOTE — Progress Notes (Signed)
Physical Therapy Weekly Progress Note  Patient Details  Name: Taylor Castaneda MRN: [id number] Date of Birth: July 07, 1932  Beginning of progress report period: June 11, 2020 End of progress report period: June 18, 2020  Today's Date: 06/18/2020 PT Individual Time: 0910-1005 PT Individual Time Calculation (min): 55 min   Patient has met 1 of 1 short term goals.  Pt is making slow progress towards LTG of Supervision assist overall. At times, pt can be supervision assist for bed mobility, transfers, and git up to 75 ft, but requires intermittent min assist from PT due to continued L side inattention and poor safety awareness and processing   Patient continues to demonstrate the following deficits muscle weakness and muscle joint tightness, decreased cardiorespiratoy endurance, impaired timing and sequencing, motor apraxia and decreased coordination, decreased attention to left and ideational apraxia, decreased initiation, decreased attention, decreased awareness, decreased problem solving, decreased safety awareness, decreased memory and delayed processing and decreased sitting balance, decreased standing balance, hemiplegia and decreased balance strategies and therefore will continue to benefit from skilled PT intervention to increase functional independence with mobility.  Patient progressing toward long term goals..  Continue plan of care.  PT Short Term Goals Week 1:  PT Short Term Goal 1 (Week 1): = to LTGs based on ELOS PT Short Term Goal 1 - Progress (Week 1): Progressing toward goal  Skilled Therapeutic Interventions/Progress Updates:   .Pt received supine in bed and agreeable to PT. Supine>sit transfer with supervision assist and min cues for attention to the LLE. PT assisted pt to don shoes EOB with max assist for time management. Stand pivot transfer to [facility] with min assist to facilitate step length on the LLE. Pt transported to day room on WC.   Pt performed pregait stepping task  over bar weight on floor x 8 BLE with UE support on RW, CGA initially to facilitate hip flexion and weight shift R to advance the LLE, improved technique with repetitions.   Gait training with RW x 2f with CGA-supervision assist from PT twith cues for step length on the L and improved AD management in turns.   Pt transported to rehab gym. Step training with RW to ascend/descend 4 inch step x 6 with CGA overall from PT. Cues for step to gait pattern from PT with improved safety of anterior descent with RLE compared to LLE.   Car transfer training with CGA assist overall for safety and cues for AD management. PT educated daughter on "car cane" for use of d/c to improve safety.   Nustep reciprocal management training x 6 min with 2 short rest breaks. Cues for decreased speed to reduce short term fatigue and.   WC mobility through hall with supervision assist with min cues for symmetry of RUE/LUE to prevent veer to the L. Patient returned to room and performed stand pivot to recliner with supervision assist and RW. Pt left sitting in recliner with call bell in reach and all needs met.          Therapy Documentation Precautions:  Precautions Precautions: Fall,Other (comment) Precaution Comments: monitor vitals with activity, hx of seizures Restrictions Weight Bearing Restrictions: No   Pain:   denies  Therapy/Group: Individual Therapy  ALorie Phenix1/15/2022, 10:08 AM

## 2020-06-18 NOTE — Progress Notes (Signed)
[place] PHYSICAL MEDICINE & REHABILITATION PROGRESS NOTE   Subjective/Complaints: No complaints this morning. Denies pain, constipation, insomnia.  Speech worsens as she gets tired Therapy going well   ROS: Patient denies CP, +SOB when getting OOB, no N/V/D   Objective:   No results found. Recent Labs    06/16/20 0456  WBC 10.0  HGB 8.5*  HCT 25.6*  PLT 183   No results for input(s): NA, K, CL, CO2, GLUCOSE, BUN, CREATININE, CALCIUM in the last 72 hours.  Intake/Output Summary (Last 24 hours) at 06/18/2020 1017 Last data filed at 06/18/2020 0745 Gross per 24 hour  Intake 218 ml  Output --  Net 218 ml     Pressure Injury 06/10/20 Coccyx Medial Stage 1 -  Intact skin with non-blanchable redness of a localized area usually over a bony prominence. (Active)  06/10/20 2200  Location: Coccyx  Location Orientation: Medial  Staging: Stage 1 -  Intact skin with non-blanchable redness of a localized area usually over a bony prominence.  Wound Description (Comments):   Present on Admission: Yes    Physical Exam: Vital Signs Blood pressure (!) 158/62, pulse 69, temperature 98.2 F (36.8 C), resp. rate 17, height 5' (1.524 m), weight 56.9 kg, SpO2 99 %. Gen: no distress, normal appearing HEENT: oral mucosa pink and moist, NCAT Cardio: Reg rate Chest: normal effort, normal rate of breathing Abd: soft, non-distended Ext: no edema Psych: pleasant, normal affect  Skin: right wrist wound with vaseline gauze and kerlix--drainage much improved Neuro: oriented x3, language more fluent by the day. Reasonable insight and awareness. RUE 4+/5 RLE 4+/5. LUE and LLE 4/5. No focal sensory deficits appreciated Musculoskeletal: no tenderness over sacrum       Assessment/Plan: 1. Functional deficits which require 3+ hours per day of interdisciplinary therapy in a comprehensive inpatient rehab setting.  Physiatrist is providing close team supervision and 24 hour management of active  medical problems listed below.  Physiatrist and rehab team continue to assess barriers to discharge/monitor patient progress toward functional and medical goals  Care Tool:  Bathing    Body parts bathed by patient: Right arm,Left arm,Chest,Abdomen (UB only today)   Body parts bathed by helper: Buttocks,Right lower leg,Left lower leg     Bathing assist Assist Level: Supervision/Verbal cueing     Upper Body Dressing/Undressing Upper body dressing Upper body dressing/undressing activity did not occur (including orthotics): Safety/medical concerns What is the patient wearing?: Pull over shirt    Upper body assist Assist Level: Minimal Assistance - Patient > 75%    Lower Body Dressing/Undressing Lower body dressing      What is the patient wearing?: Pants     Lower body assist Assist for lower body dressing: Minimal Assistance - Patient > 75%     Toileting Toileting    Toileting assist Assist for toileting: Moderate Assistance - Patient 50 - 74%     Transfers Chair/bed transfer  Transfers assist     Chair/bed transfer assist level: Moderate Assistance - Patient 50 - 74%     Locomotion Ambulation   Ambulation assist      Assist level: Minimal Assistance - Patient > 75% Assistive device: Walker-rolling Max distance: 73ft   Walk 10 feet activity   Assist  Walk 10 feet activity did not occur:  (knee pain and s.o.b)  Assist level: Contact Guard/Touching assist Assistive device: Walker-rolling   Walk 50 feet activity   Assist Walk 50 feet with 2 turns activity did not occur: Safety/medical  concerns  Assist level: Minimal Assistance - Patient > 75% Assistive device: Walker-rolling    Walk 150 feet activity   Assist Walk 150 feet activity did not occur: Safety/medical concerns         Walk 10 feet on uneven surface  activity   Assist Walk 10 feet on uneven surfaces activity did not occur: Safety/medical concerns          Wheelchair     Assist Will patient use wheelchair at discharge?: No             Wheelchair 50 feet with 2 turns activity    Assist            Wheelchair 150 feet activity     Assist          Blood pressure (!) 158/62, pulse 69, temperature 98.2 F (36.8 C), resp. rate 17, height 5' (1.524 m), weight 56.9 kg, SpO2 99 %.  Medical Problem List and Plan: 1.Decreased functional ability with aphasiasecondary to several punctate right centrum semiovale infarct likely due to severe intracranial stenosis -patient may Shower with right wrist covered. -ELOS/Goals: 7-10 days, Mod I with PT, OT and min assist with SLP  -may resume full therapy , as discussed with PT check ortho vitals  -Continue CIR 2. Antithrombotics: -DVT/anticoagulation:Subcutaneous heparin switched to lovenox- will hold givne severe anemia  -antiplatelet therapy: Aspirin 325 mg daily and Plavix 75 mg daily x3 months then Plavix alone 3. Pain Management:D/c Tramadol since she denies pain and has AKI. Chronic sacral fx asymptomatic  4. Mood:Provide emotional support -antipsychotic agents: N/A 5. Neuropsych: This patientiscapable of making decisions on herown behalf. 6. Skin/Wound Care:Routine skin checks 7. Fluids/Electrolytes/Nutrition:Routine in and outs with follow-up chemistries BMET Monday , monitor K+ and Cr given AKI 8. Cholelithiasis/choledocholithiasis. Status post ERCP followed by laparoscopic cholecystectomy 06/01/2020 -pt still denies pain 9. Seizure disorder. Keppra 500 mg twice daily. EEG negative 10. Hypertension. Altace 5 mg daily, Coreg 12.5 mg twice daily. -1/9 SBP has been slightly labile but want to avoid hypoperfusion--- Vitals:   06/17/20 1953 06/18/20 0452  BP: (!) 163/83 (!) 158/62  Pulse: 76 69  Resp: 15 17  Temp: 97.8 F (36.6 C) 98.2 F (36.8 C)  SpO2: 99% 99%  change furosemide  to qam, may need to increase dose  1/15: maintain 40mg  daily until Cr check on Monday.   11. Diabetes mellitus. Hemoglobin A1c 8.7. Lantus insulin 8 units nightly---pt takes 16u at bedtime at home  CBG (last 3)  Recent Labs    06/17/20 1632 06/17/20 2057 06/18/20 0602  GLUCAP 189* 193* 97  good control this am on lantus 10U  12. Hyperlipidemia. Lipitor 13. History of colon cancer with resection 1998. Follow-up as outpatient  14.  Leukocytosis no fever monitor for now  15.  DOE- likely deconditioning plus anemia, will monitor in therapy , check CXR to look for pulm edema pulm HTN , off lasix 16,  ABLA- ask GI to re eval suspect GI blood loss although no bright red blood- transfuse given poor activity tolerance noted , son has noticed dark stools - appreciate GI note no clear cut signs of GIB- await post transfusion Hgb   GI suggested possible retroperitoneal bleed- no evidence of bleed Repeat CBC tomorrow.   LOS: 8 days A FACE TO FACE EVALUATION WAS PERFORMED  Clide Deutscher Lorelai Huyser 06/18/2020, 10:17 AM

## 2020-06-19 LAB — GLUCOSE, CAPILLARY
Glucose-Capillary: 133 mg/dL — ABNORMAL HIGH (ref 70–99)
Glucose-Capillary: 140 mg/dL — ABNORMAL HIGH (ref 70–99)
Glucose-Capillary: 160 mg/dL — ABNORMAL HIGH (ref 70–99)
Glucose-Capillary: 236 mg/dL — ABNORMAL HIGH (ref 70–99)

## 2020-06-19 LAB — CBC
HCT: 25.3 % — ABNORMAL LOW (ref 36.0–46.0)
Hemoglobin: 8.4 g/dL — ABNORMAL LOW (ref 12.0–15.0)
MCH: 32.2 pg (ref 26.0–34.0)
MCHC: 33.2 g/dL (ref 30.0–36.0)
MCV: 96.9 fL (ref 80.0–100.0)
Platelets: 213 10*3/uL (ref 150–400)
RBC: 2.61 MIL/uL — ABNORMAL LOW (ref 3.87–5.11)
RDW: 13.9 % (ref 11.5–15.5)
WBC: 8.8 10*3/uL (ref 4.0–10.5)
nRBC: 0 % (ref 0.0–0.2)

## 2020-06-19 NOTE — Progress Notes (Signed)
[facility] PHYSICAL MEDICINE & REHABILITATION PROGRESS NOTE   Subjective/Complaints: No complaints this morning On the commode.  Hgb 8.4   ROS: Patient denies CP, +SOB when getting OOB, no N/V/D   Objective:   No results found. Recent Labs    06/19/20 0458  WBC 8.8  HGB 8.4*  HCT 25.3*  PLT 213   No results for input(s): NA, K, CL, CO2, GLUCOSE, BUN, CREATININE, CALCIUM in the last 72 hours.  Intake/Output Summary (Last 24 hours) at 06/19/2020 2032 Last data filed at 06/19/2020 1810 Gross per 24 hour  Intake 350 ml  Output --  Net 350 ml     Pressure Injury 06/10/20 Coccyx Medial Stage 1 -  Intact skin with non-blanchable redness of a localized area usually over a bony prominence. (Active)  06/10/20 2200  Location: Coccyx  Location Orientation: Medial  Staging: Stage 1 -  Intact skin with non-blanchable redness of a localized area usually over a bony prominence.  Wound Description (Comments):   Present on Admission: Yes    Physical Exam: Vital Signs Blood pressure (!) 145/57, pulse 74, temperature 98.5 F (36.9 C), resp. rate 15, height 5' (1.524 m), weight 56.9 kg, SpO2 100 %. Gen: no distress, normal appearing HEENT: oral mucosa pink and moist, NCAT Cardio: Reg rate Chest: normal effort, normal rate of breathing Abd: soft, non-distended Ext: no edema Psych: pleasant, normal affect  Skin: right wrist wound with vaseline gauze and kerlix--drainage much improved Neuro: oriented x3, language more fluent by the day. Reasonable insight and awareness. RUE 4+/5 RLE 4+/5. LUE and LLE 4/5. No focal sensory deficits appreciated Musculoskeletal: no tenderness over sacrum       Assessment/Plan: 1. Functional deficits which require 3+ hours per day of interdisciplinary therapy in a comprehensive inpatient rehab setting.  Physiatrist is providing close team supervision and 24 hour management of active medical problems listed below.  Physiatrist and rehab team  continue to assess barriers to discharge/monitor patient progress toward functional and medical goals  Care Tool:  Bathing    Body parts bathed by patient: Right arm,Left arm,Chest,Abdomen (UB only today)   Body parts bathed by helper: Buttocks,Right lower leg,Left lower leg     Bathing assist Assist Level: Supervision/Verbal cueing     Upper Body Dressing/Undressing Upper body dressing Upper body dressing/undressing activity did not occur (including orthotics): Safety/medical concerns What is the patient wearing?: Pull over shirt    Upper body assist Assist Level: Minimal Assistance - Patient > 75%    Lower Body Dressing/Undressing Lower body dressing      What is the patient wearing?: Pants     Lower body assist Assist for lower body dressing: Minimal Assistance - Patient > 75%     Toileting Toileting    Toileting assist Assist for toileting: Moderate Assistance - Patient 50 - 74%     Transfers Chair/bed transfer  Transfers assist     Chair/bed transfer assist level: Moderate Assistance - Patient 50 - 74%     Locomotion Ambulation   Ambulation assist      Assist level: Minimal Assistance - Patient > 75% Assistive device: Walker-rolling Max distance: 38ft   Walk 10 feet activity   Assist  Walk 10 feet activity did not occur:  (knee pain and s.o.b)  Assist level: Contact Guard/Touching assist Assistive device: Walker-rolling   Walk 50 feet activity   Assist Walk 50 feet with 2 turns activity did not occur: Safety/medical concerns  Assist level: Minimal Assistance - Patient >  75% Assistive device: Walker-rolling    Walk 150 feet activity   Assist Walk 150 feet activity did not occur: Safety/medical concerns         Walk 10 feet on uneven surface  activity   Assist Walk 10 feet on uneven surfaces activity did not occur: Safety/medical concerns         Wheelchair     Assist Will patient use wheelchair at discharge?: No              Wheelchair 50 feet with 2 turns activity    Assist            Wheelchair 150 feet activity     Assist          Blood pressure (!) 145/57, pulse 74, temperature 98.5 F (36.9 C), resp. rate 15, height 5' (1.524 m), weight 56.9 kg, SpO2 100 %.  Medical Problem List and Plan: 1.Decreased functional ability with aphasiasecondary to several punctate right centrum semiovale infarct likely due to severe intracranial stenosis -patient may Shower with right wrist covered. -ELOS/Goals: 7-10 days, Mod I with PT, OT and min assist with SLP  -may resume full therapy , as discussed with PT check ortho vitals  -Continue CIR 2. Antithrombotics: -DVT/anticoagulation:Subcutaneous heparin switched to lovenox- will hold givne severe anemia  -antiplatelet therapy: Aspirin 325 mg daily and Plavix 75 mg daily x3 months then Plavix alone 3. Pain Management:D/c Tramadol since she denies pain and has AKI. Chronic sacral fx asymptomatic  4. Mood:Provide emotional support -antipsychotic agents: N/A 5. Neuropsych: This patientiscapable of making decisions on herown behalf. 6. Skin/Wound Care:Routine skin checks 7. Fluids/Electrolytes/Nutrition:Routine in and outs with follow-up chemistries BMET Monday , monitor K+ and Cr given AKI 8. Cholelithiasis/choledocholithiasis. Status post ERCP followed by laparoscopic cholecystectomy 06/01/2020 -pt still denies pain 9. Seizure disorder. Keppra 500 mg twice daily. EEG negative 10. Hypertension. Altace 5 mg daily, Coreg 12.5 mg twice daily. -1/9 SBP has been slightly labile but want to avoid hypoperfusion--- Vitals:   06/19/20 1312 06/19/20 2001  BP: (!) 122/44 (!) 145/57  Pulse: 73 74  Resp: 17 15  Temp: 99.2 F (37.3 C) 98.5 F (36.9 C)  SpO2: 99% 100%  change furosemide to qam, may need to increase dose  1/15: maintain 40mg  daily until Cr check  on Monday.    1/16: SBP high and DBP low- continue to monitor.  11. Diabetes mellitus. Hemoglobin A1c 8.7. Lantus insulin 8 units nightly---pt takes 16u at bedtime at home  1/16: CBG 133-160: continue to monitor.  CBG (last 3)  Recent Labs    06/19/20 1203 06/19/20 1204 06/19/20 1653  GLUCAP 133* 140* 160*  good control this am on lantus 10U  12. Hyperlipidemia. Lipitor 13. History of colon cancer with resection 1998. Follow-up as outpatient  14.  Leukocytosis no fever monitor for now   1/16: WBC down to 8.8, routine CBC ordered for tomorrow  15.  DOE- likely deconditioning plus anemia, will monitor in therapy , check CXR to look for pulm edema pulm HTN , off lasix 16,  ABLA- ask GI to re eval suspect GI blood loss although no bright red blood- transfuse given poor activity tolerance noted , son has noticed dark stools - appreciate GI note no clear cut signs of GIB- await post transfusion Hgb   GI suggested possible retroperitoneal bleed- no evidence of bleed      1/16 Hgb 8.4, repeat tomorrow.   LOS: 9 days A FACE TO FACE EVALUATION WAS  PERFORMED  Izora Ribas 06/19/2020, 8:32 PM

## 2020-06-20 ENCOUNTER — Inpatient Hospital Stay (HOSPITAL_COMMUNITY): Payer: Medicare Other | Admitting: Occupational Therapy

## 2020-06-20 ENCOUNTER — Inpatient Hospital Stay (HOSPITAL_COMMUNITY): Payer: Medicare Other | Admitting: Speech Pathology

## 2020-06-20 ENCOUNTER — Inpatient Hospital Stay (HOSPITAL_COMMUNITY): Payer: Medicare Other | Admitting: Physical Therapy

## 2020-06-20 LAB — GLUCOSE, CAPILLARY
Glucose-Capillary: 214 mg/dL — ABNORMAL HIGH (ref 70–99)
Glucose-Capillary: 100 mg/dL — ABNORMAL HIGH (ref 70–99)
Glucose-Capillary: 41 mg/dL — CL (ref 70–99)
Glucose-Capillary: 94 mg/dL (ref 70–99)
Glucose-Capillary: 148 mg/dL — ABNORMAL HIGH (ref 70–99)
Glucose-Capillary: 167 mg/dL — ABNORMAL HIGH (ref 70–99)
Glucose-Capillary: 168 mg/dL — ABNORMAL HIGH (ref 70–99)

## 2020-06-20 LAB — BASIC METABOLIC PANEL
Anion gap: 12 (ref 5–15)
BUN: 22 mg/dL (ref 8–23)
CO2: 27 mmol/L (ref 22–32)
Calcium: 8.3 mg/dL — ABNORMAL LOW (ref 8.9–10.3)
Chloride: 101 mmol/L (ref 98–111)
Creatinine, Ser: 1.41 mg/dL — ABNORMAL HIGH (ref 0.44–1.00)
GFR, Estimated: 37 mL/min — ABNORMAL LOW (ref >60)
Glucose, Bld: 39 mg/dL — CL (ref 70–99)
Potassium: 3.7 mmol/L (ref 3.5–5.1)
Sodium: 140 mmol/L (ref 135–145)

## 2020-06-20 NOTE — Progress Notes (Signed)
Occupational Therapy Session Note  Patient Details  Name: Taylor Castaneda MRN: [id number] Date of Birth: 27-Sep-1932  Today's Date: 06/20/2020 OT Individual Time: 1100-1155 OT Individual Time Calculation (min): 55 min   Short Term Goals: Week 2:  OT Short Term Goal 1 (Week 2): STG=LTG d/t ELOS  Skilled Therapeutic Interventions/Progress Updates:    Pt greeted in bed with no c/o pain, declining shower or sponge bathing at the sink. She was agreeable to start session by using the restroom. Mod A to don shoes EOB, pt would benefit form trialing a shoe horn. Ambulatory transfer to toilet completed using RW with CGA-Min A, vcs for upright posture and to avoid obstacles on the Lt side. Pt initiated lowering clothing and had a continent bladder void. Cues for completing perhygiene before pulling up LB garments. Cues also for walker placement when completing hand washing standing at the sink after. She then transferred to the w/c and we talked about her occupational history. 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 Escorted her via w/c to the hospital food court and set her up at the piano. Pt able to play several hymns, using both hands and also the foot pedals. Pt actively problem solving in regards to selecting correct chord combinations. Pt also attending to her Lt side while playing. Afterwards, to work on [employer] and Lt attention, pt self propelled w/c in the gift shop. HOH cues to facilitate larger more efficient pushes. Cues and assistance to avoid obstacles on the Lt side. Pt was then returned to the unit and completed an ambulatory transfer to her recliner using RW with CGA. Left her with all needs within reach and chair alarm set. Tx focus placed on functional transfers, ADL retraining, functional cognition, and NMR.    Therapy Documentation Precautions:  Precautions Precautions: Fall,Other (comment) Precaution Comments: monitor vitals with activity, hx of  seizures Restrictions Weight Bearing Restrictions: No ADL: ADL Eating: Supervision/safety Where Assessed-Eating: Bed level Grooming: Minimal assistance Where Assessed-Grooming: Edge of bed Upper Body Bathing: Supervision/safety Where Assessed-Upper Body Bathing: Shower Lower Body Bathing: Moderate assistance Where Assessed-Lower Body Bathing: Shower Upper Body Dressing: Minimal assistance Where Assessed-Upper Body Dressing: Edge of bed Lower Body Dressing: Maximal assistance Where Assessed-Lower Body Dressing: Edge of bed Toileting: Moderate assistance Where Assessed-Toileting: Glass blower/designer: Midwife: Energy manager: Environmental education officer Method: Ambulating      Therapy/Group: Individual Therapy  Goku Harb A Lillie Portner 06/20/2020, 12:22 PM

## 2020-06-20 NOTE — Progress Notes (Signed)
Physical Therapy Session Note  Patient Details  Name: Taylor Castaneda MRN: [id number] Date of Birth: Jan 12, 1933  Today's Date: 06/20/2020 PT Individual Time: 8638-1771 PT Individual Time Calculation (min): 47 min   Short Term Goals: Week 1:  PT Short Term Goal 1 (Week 1): = to LTGs based on ELOS PT Short Term Goal 1 - Progress (Week 1): Progressing toward goal Week 2:  PT Short Term Goal 1 (Week 2): STG=LTG due to ELOS  Skilled Therapeutic Interventions/Progress Updates:    Patient supine in bed upon PT arrival. Patient alert and agreeable to PT session. Patient denied pain during session. Noted to have had bout of UI in brief.  Therapeutic Activity: Bed Mobility: Patient performed supine to/from sit with SBA for BLE and pt able to push UB up from bed surface. In return to supine, pt's fatigue req's min A for BLE to bed surface. With vc for positioning, pt able to bring BLE into hooklying with CGA and perform bridging to assist with improved bed positioning toward HOB.  Transfers: Patient performed sit <> stand, stand pivot and toilet transfers throughout session. Pt demonstrates learning with initial reach for RW just prior to stand with quick correction of hand placement to w/c armrests. Visual instruction and vc for more posterior foot placement prior to stand with pt complying and then repositioning feet more anteriorly just prior to effort. Improved anterior push from BUE after blocked practice x5. Pt req's continued vc for technique throughout session. Toilet transfer performed with SUP/ SBA.   Gait Training:  Patient ambulated short distances in room using RW with SBA and some CGA for clearing threshold to bathroom. In hallway, she is able to reach 100' total with 2 standing rest breaks to complete distance. At end of distance, she is fatigued and not to have increased respiratory rate. VC throughout for proximity to walker, upright posture, increased step height/ length.     Neuromuscular Re-ed: NMR facilitated during session with focus on standing balance in order to improve functional mobility performance and independence. Pt guided in toe tapping activity to 4" step x10 with pt requiring 4 reps prior to fully clearing foot for all subsequent steps. Minisquats performed x5 to shallow target, then x5 to deeper target for improved proprioception of center of mass and facilitation of ankle musculature to maintain balance. Attempt to facilitate balance over toes/ heels with pt unable to return demonstrate appropriately. Continued wedge training expected to assist in improving toe/ ankle strategies.   Patient supine in bed at end of session with bed alarm set, and all needs within reach. Tray table at bed side.     Therapy Documentation Precautions:  Precautions Precautions: Fall,Other (comment) Precaution Comments: monitor vitals with activity, hx of seizures Restrictions Weight Bearing Restrictions: No General:   Vital Signs: Therapy Vitals Temp: 97.8 F (36.6 C) Pulse Rate: 67 Resp: 18 BP: (!) 117/44 Patient Position (if appropriate): Sitting Oxygen Therapy SpO2: 100 % O2 Device: Room Air    Therapy/Group: Individual Therapy  Alger Simons 06/20/2020, 12:51 PM

## 2020-06-20 NOTE — Progress Notes (Signed)
[facility] PHYSICAL MEDICINE & REHABILITATION PROGRESS NOTE   Subjective/Complaints:  Pt feels ok this am, did not get hs snack last noc, discussed low CBG this am   ROS: Patient denies CP, +SOB when getting OOB, no N/V/D   Objective:   No results found. Recent Labs    06/19/20 0458  WBC 8.8  HGB 8.4*  HCT 25.3*  PLT 213   Recent Labs    06/20/20 0518  NA 140  K 3.7  CL 101  CO2 27  GLUCOSE 39*  BUN 22  CREATININE 1.41*  CALCIUM 8.3*    Intake/Output Summary (Last 24 hours) at 06/20/2020 1037 Last data filed at 06/20/2020 0845 Gross per 24 hour  Intake 310 ml  Output -  Net 310 ml     Pressure Injury 06/10/20 Coccyx Medial Stage 1 -  Intact skin with non-blanchable redness of a localized area usually over a bony prominence. (Active)  06/10/20 2200  Location: Coccyx  Location Orientation: Medial  Staging: Stage 1 -  Intact skin with non-blanchable redness of a localized area usually over a bony prominence.  Wound Description (Comments):   Present on Admission: Yes    Physical Exam: Vital Signs Blood pressure (!) 145/65, pulse 65, temperature 98.9 F (37.2 C), temperature source Oral, resp. rate 16, height 5' (1.524 m), weight 56.9 kg, SpO2 96 %. Gen: no distress, normal appearing HEENT: oral mucosa pink and moist, NCAT Cardio: Reg rate Chest: normal effort, normal rate of breathing Abd: soft, non-distended Ext: no edema Psych: pleasant, normal affect  Skin: right wrist wound with vaseline gauze and kerlix--drainage much improved Neuro: oriented x3, language more fluent by the day. Reasonable insight and awareness. RUE 4+/5 RLE 4+/5. LUE and LLE 4/5. No focal sensory deficits appreciated Musculoskeletal: no tenderness over sacrum       Assessment/Plan: 1. Functional deficits which require 3+ hours per day of interdisciplinary therapy in a comprehensive inpatient rehab setting.  Physiatrist is providing close team supervision and 24 hour management  of active medical problems listed below.  Physiatrist and rehab team continue to assess barriers to discharge/monitor patient progress toward functional and medical goals  Care Tool:  Bathing    Body parts bathed by patient: Right arm,Left arm,Chest,Abdomen (UB only today)   Body parts bathed by helper: Buttocks,Right lower leg,Left lower leg     Bathing assist Assist Level: Supervision/Verbal cueing     Upper Body Dressing/Undressing Upper body dressing Upper body dressing/undressing activity did not occur (including orthotics): Safety/medical concerns What is the patient wearing?: Pull over shirt    Upper body assist Assist Level: Minimal Assistance - Patient > 75%    Lower Body Dressing/Undressing Lower body dressing      What is the patient wearing?: Pants     Lower body assist Assist for lower body dressing: Minimal Assistance - Patient > 75%     Toileting Toileting    Toileting assist Assist for toileting: Moderate Assistance - Patient 50 - 74%     Transfers Chair/bed transfer  Transfers assist     Chair/bed transfer assist level: Moderate Assistance - Patient 50 - 74%     Locomotion Ambulation   Ambulation assist      Assist level: Minimal Assistance - Patient > 75% Assistive device: Walker-rolling Max distance: 37ft   Walk 10 feet activity   Assist  Walk 10 feet activity did not occur:  (knee pain and s.o.b)  Assist level: Contact Guard/Touching assist Assistive device: Walker-rolling  Walk 50 feet activity   Assist Walk 50 feet with 2 turns activity did not occur: Safety/medical concerns  Assist level: Minimal Assistance - Patient > 75% Assistive device: Walker-rolling    Walk 150 feet activity   Assist Walk 150 feet activity did not occur: Safety/medical concerns         Walk 10 feet on uneven surface  activity   Assist Walk 10 feet on uneven surfaces activity did not occur: Safety/medical concerns          Wheelchair     Assist Will patient use wheelchair at discharge?: No             Wheelchair 50 feet with 2 turns activity    Assist            Wheelchair 150 feet activity     Assist          Blood pressure (!) 145/65, pulse 65, temperature 98.9 F (37.2 C), temperature source Oral, resp. rate 16, height 5' (1.524 m), weight 56.9 kg, SpO2 96 %.  Medical Problem List and Plan: 1.Decreased functional ability with aphasiasecondary to several punctate right centrum semiovale infarct likely due to severe intracranial stenosis -patient may Shower  -ELOS/Goals: 7-10 days, Mod I with PT, OT and min assist with SLP  -may resume full therapy , as discussed with PT check ortho vitals  -Continue CIR 2. Antithrombotics: -DVT/anticoagulation:Subcutaneous heparin switched to lovenox- will hold givne severe anemia  -antiplatelet therapy: Aspirin 325 mg daily and Plavix 75 mg daily x3 months then Plavix alone 3. Pain Management:D/c Tramadol since she denies pain and has AKI. Chronic sacral fx asymptomatic  4. Mood:Provide emotional support -antipsychotic agents: N/A 5. Neuropsych: This patientiscapable of making decisions on herown behalf. 6. Skin/Wound Care:Routine skin checks 7. Fluids/Electrolytes/Nutrition:Routine in and outs with follow-up chemistries BMET Monday , monitor K+ and Cr given AKI 8. Cholelithiasis/choledocholithiasis. Status post ERCP followed by laparoscopic cholecystectomy 06/01/2020 -pt still denies pain 9. Seizure disorder. Keppra 500 mg twice daily. EEG negative 10. Hypertension. Altace 5 mg daily, Coreg 12.5 mg twice daily. -1/9 SBP has been slightly labile but want to avoid hypoperfusion--- Vitals:   06/19/20 2001 06/20/20 0502  BP: (!) 145/57 (!) 145/65  Pulse: 74 65  Resp: 15 16  Temp: 98.5 F (36.9 C) 98.9 F (37.2 C)  SpO2: 100% 96%  change furosemide  to qam, 40mg  11. Diabetes mellitus. Hemoglobin A1c 8.7. Lantus insulin 8 units nightly---pt takes 16u at bedtime at home    CBG (last 3)  Recent Labs    06/19/20 2044 06/20/20 0600 06/20/20 0625  GLUCAP 236* 41* 94  hypoglycemia, reduce lantus and make sure pt gets hs snack 12. Hyperlipidemia. Lipitor 13. History of colon cancer with resection 1998. Follow-up as outpatient  14.  Leukocytosis no fever monitor for now   1/16: WBC down to 8.8, routine CBC ordered for tomorrow  15.  DOE- likely deconditioning plus anemia, will monitor in therapy , check CXR to look for pulm edema pulm HTN , off lasix 16,  ABLA- appreciate GI note no clear cut signs of GIB- await post transfusion Hgb   GI suggested possible retroperitoneal bleed- no evidence of bleed      1/16 Hgb 8.4, stable  LOS: 10 days A FACE TO FACE EVALUATION WAS PERFORMED  Charlett Blake 06/20/2020, 10:37 AM

## 2020-06-20 NOTE — Progress Notes (Signed)
Speech Language Pathology Daily Session Note  Patient Details  Name: CRYSTALYNN MCINERNEY MRN: [id number] Date of Birth: 09/10/1932  Today's Date: 06/20/2020 SLP Individual Time: 8867-7373 SLP Individual Time Calculation (min): 56 min  Short Term Goals: Week 2: SLP Short Term Goal 1 (Week 2): STG=LTG due to remaining length of stay  Skilled Therapeutic Interventions: Pt was seen for skilled ST targeting communication goals. SLP facilitated session with overall Min A verbal cues to identify and describe 3 differences between similar photographs - cues were for attention to detail, a problem solving strategy, as well as word finding. During a generative divergent naming task, pt named 10 animals within 8 minutes and and 10 states within 9 minutes with Min-Mod A mostly semantic cues, Mod A and a written aid for recall within task. Son arrived at very end of session. Pt left sitting in recliner with alarm set and needs within reach, son present. Continue per current plan of care.          Pain Pain Assessment Pain Scale: 0-10 Pain Score: 0-No pain  Therapy/Group: Individual Therapy  Arbutus Leas 06/20/2020, 7:09 AM

## 2020-06-20 NOTE — Significant Event (Signed)
Hypoglycemic Event  CBG: 41  Treatment: Orange juice given   Symptoms: None noted   Follow-up CBG: Time:0625 CBG Result:94  Possible Reasons for Event: Lantus given last night.   Comments/MD notified:    Celesta Aver

## 2020-06-21 ENCOUNTER — Inpatient Hospital Stay (HOSPITAL_COMMUNITY): Payer: Medicare Other

## 2020-06-21 ENCOUNTER — Inpatient Hospital Stay (HOSPITAL_COMMUNITY): Payer: Medicare Other | Admitting: Physical Therapy

## 2020-06-21 ENCOUNTER — Inpatient Hospital Stay (HOSPITAL_COMMUNITY): Payer: Medicare Other | Admitting: Occupational Therapy

## 2020-06-21 ENCOUNTER — Inpatient Hospital Stay (HOSPITAL_COMMUNITY): Payer: Medicare Other | Admitting: Speech Pathology

## 2020-06-21 DIAGNOSIS — R4701 Aphasia: Secondary | ICD-10-CM

## 2020-06-21 DIAGNOSIS — R569 Unspecified convulsions: Secondary | ICD-10-CM

## 2020-06-21 LAB — GLUCOSE, CAPILLARY
Glucose-Capillary: 123 mg/dL — ABNORMAL HIGH (ref 70–99)
Glucose-Capillary: 147 mg/dL — ABNORMAL HIGH (ref 70–99)
Glucose-Capillary: 87 mg/dL (ref 70–99)
Glucose-Capillary: 229 mg/dL — ABNORMAL HIGH (ref 70–99)
Glucose-Capillary: 162 mg/dL — ABNORMAL HIGH (ref 70–99)

## 2020-06-21 MED ORDER — FUROSEMIDE 20 MG PO TABS
20.0000 mg | ORAL_TABLET | Freq: Every day | ORAL | Status: DC
  Administered 2020-06-22: 20 mg via ORAL
  Filled 2020-06-21: qty 1

## 2020-06-21 MED ORDER — IOHEXOL 350 MG/ML SOLN
100.0000 mL | Freq: Once | INTRAVENOUS | Status: AC | PRN
  Administered 2020-06-21: 100 mL via INTRAVENOUS

## 2020-06-21 NOTE — Progress Notes (Signed)
Occupational Therapy Session Note  Patient Details  Name: Taylor Castaneda MRN: [id number] Date of Birth: 05-03-1933  Today's Date: 06/21/2020 OT Missed Time: 47 Minutes Missed Time Reason: Patient on bedrest (per MD request to hold therapy today)   Short Term Goals: Week 1:  OT Short Term Goal 1 (Week 1): STG=LTG d/t ELOS OT Short Term Goal 1 - Progress (Week 1): Not met Week 2:  OT Short Term Goal 1 (Week 2): STG=LTG d/t ELOS  Skilled Therapeutic Interventions/Progress Updates:    Pt greeted at time of session supine in bed resting, son in room and MD leaving. Per MD, requesting pt hold off on therapy today d/t BP issues. Upon entering room, pt is aware of bed rest and okay with missing therapy session. Talked with son regarding DME already at the home, he presented pictures as well, pt has: BSC, RW, TTB, shower seat, standard w/c with leg rests and no cushion. Missed 45 mins of OT d/t bed rest.   Therapy Documentation Precautions:  Precautions Precautions: Fall,Other (comment) Precaution Comments: monitor vitals with activity, hx of seizures Restrictions Weight Bearing Restrictions: No     Therapy/Group: Individual Therapy  Viona Gilmore 06/21/2020, 11:01 AM

## 2020-06-21 NOTE — Progress Notes (Signed)
Speech Language Pathology Daily Session Note  Patient Details  Name: Taylor Castaneda MRN: [id number] Date of Birth: 03-28-1933  Today's Date: 06/21/2020 SLP Individual Time: 1300-1355 SLP Individual Time Calculation (min): 55 min  Short Term Goals: Week 2: SLP Short Term Goal 1 (Week 2): STG=LTG due to remaining length of stay  Skilled Therapeutic Interventions:   Bedside swallow evaluation: Due to acute changes and concern for extension of stroke or other neurological event today, a bedside swallow evaluation was completed. Son present for session. Pt consumed thin H2O via cup and straw, including consecutive sips with occasionally multiple swallows per bolus noted but no overt s/sx aspiration. She also consumed regular texture lunch tray solids with efficient mastication and oral clearance, able to self feed. However, increased cueing required for initiation and sustained attention to intake, as well as problem solving during meal. Therefore, would recommend she continue a regular texture, thin liquid diet, but she will require full supervision due to cognition, or else SLP fears her intake will decrease. Discussed this with pt's son and he was in agreement; he was also trained to provide supervision if present.  Cognitive-linguistic treatment and family education interventions: Pt's verbal output was fairly limited during session and was more effortful, with phonemic paraphasias and word finding when answering simple questions. As previously mentioned, exacerbation of attention, problem solving, initiation, and question potential change in comprehension and/or receptive deficits. Will continue to assess changes to cognition and speech/language at next available session. Education regarding swallowing as well as current congitive-linguistic function provided for son. Explicit recommendation for 24/7 supervision and full assist with medication and finance management, as well as other cognitively  demanding tasks given. Explained rationale for this, particularly related to pt's deficits in working and short term memory, and the safety risk this can present if she were to be alone. He was in agreement. Pt left sitting in bed continuing to eat lunch with her son providing supervision, bed alarm set. Continue per current plan of care.       Pain Pain Assessment Pain Scale: 0-10 Pain Score: 0-No pain  Therapy/Group: Individual Therapy  Arbutus Leas 06/21/2020, 7:29 AM

## 2020-06-21 NOTE — Progress Notes (Signed)
EEG complete - results pending 

## 2020-06-21 NOTE — Progress Notes (Signed)
Patient with episode of aphasia.  Spoke with neurology, Dr. Katherine Roan, repeat CTA unchanged.  Episode largely resolved.  Patient will remain at bedside today she can have speech.  May resume therapy tomorrow if patient remains stable.  Repeat MRI and EEG ordered per neuro Per neuro if additional work-up is negative and patient remained stable, patient should be okay for home discharge on Friday

## 2020-06-21 NOTE — Code Documentation (Addendum)
Stroke Response Nurse Documentation Code Documentation  Taylor Castaneda is a 87 y.o. female admitted to [facility] with past medical hx of hyperlipidemia, Diabetes, Seizures, colon cancer and known intracranial atherosclerosis. Code stroke was activated by [facility] after patient was noted to have trouble with her speech this morning when her son arrived.  Stroke team at the bedside after activation. Patient to CT with team. NIHSS 4, see documentation for details and code stroke times. Patient with right facial droop, right arm weakness, left limb ataxia and Expressive aphasia  on exam. The following imaging was completed:  CT, CTA head and neck, CTP. Patient is not a candidate for tPA due to LKW unclear. Patient reports she was normal when she woke up, but she is unable to recall what time she woke up. Nursing rounded at 0715 and patient was able to follow commands and answer simple questions. Son arrived at 13 and he reports that she was more quiet than usual and not speaking full conversation. At [place], the nurse returned to give meds and stated the patient was the same as when she had introduced herself at [facility]. At [place], patient was being seen by physical therapy where right sided weakness was noted. Due to patient able to answer simple questions, unknown if symptoms were present at [facility] when nurse rounded.   Care/Plan: [facility] Swallow Screen, Bed Rest x 12 hours, and Q2 mNIHSS/VS x 12 hours. Bedside handoff with ED RN Caryl Pina.    Kathrin Greathouse  Stroke Response RN

## 2020-06-21 NOTE — Progress Notes (Signed)
Completed [facility] swallow screen on patient at this time. Unable to fully drink water without stopping. Patient already has an order for speech and will see them at 1300. Plan to keep patient NPO until speech evaluates.   Nursing and Nurse Tech made aware. Patient and Family updated.

## 2020-06-21 NOTE — Progress Notes (Signed)
Physical Therapy Session Note  Patient Details  Name: Taylor Castaneda MRN: [id number] Date of Birth: 03/21/1933  Today's Date: 06/21/2020 PT Individual Time: 0915-0957 PT Individual Time Calculation (min): 42 min   Short Term Goals: Week 2:  PT Short Term Goal 1 (Week 2): STG=LTG due to ELOS Week 3:     Skilled Therapeutic Interventions/Progress Updates:    Patient supine in bed upon PT arrival. When asked her disposition, pt responds with unintelligible speech. Continued attempts for responses to y/n questions with pt able to respond with "yeah". Pt is noted to have had bowel and bladder incontinence in bed. She denied pain throughout session.  Therapeutic Activity: Bed Mobility: With concern for change in speech from baseline, pt is instructed to transition supine--> sit. She is unable to initiate and req's Min A with vc throughout in order to bring LLE to EOB. Cues for pt to shift R hip around to reach EOB. Req's Mod A to bring RLE around and for UB to reach EOB. Sitting balance maintained with SBA and improves to supervision later in session. Return to supine req's Mod A for BLE to bed surface and for positioning toward HOB.  Transfers: Patient performed sit to/from stand bed side to walker. Performance quality decreased form previous day. Pt asked if she felt different and responds with "yeah". Unable to produce pivot stepping and pt returned to sit EOB with Min A. Stand pivot to w/c req's Mod A for positioning and controlled descent. Toilet transfer is similar and req's Mod A to complete. Hygiene and dressing requires Max/ total A to complete as pt picks up incorrect foot and is unable to lift R leg for donning pants. Stand pivot back to bed with Mod A and pt reaching BUE to bed surface.   Neuromuscular Re-ed: NMR facilitated during session with focus on mobility and strength assessment of RLE/ UE. Noted to have decreased coordination and involvement of R side throughout all mobility  performed this session. VC throughout for increased use of R extremities and for technique desired with pt only able to follow instructions 25% of attempts.   With decline in pt's overall ability to speak and to perform mobility as compared to pt's baseline, nursing notified in order to determine need for additional help and assessment. Vitals taken and pt determined by nsg to vary from baseline and Code Stroke called. This PT provided information to stroke team on arrival and care turned over to them on determined need for CT, MRI and blood tests in order to fully assess.   Patient taken in bed by team for imaging at end of session.     Therapy Documentation Precautions:  Precautions Precautions: Fall,Other (comment) Precaution Comments: monitor vitals with activity, hx of seizures Restrictions Weight Bearing Restrictions: No General:   Vital Signs: Therapy Vitals Temp: 99.6 F (37.6 C) Pulse Rate: 79 Resp: 18 BP: (!) 175/62 Patient Position (if appropriate): Lying Oxygen Therapy SpO2: 98 % O2 Device: Room Air    Therapy/Group: Individual Therapy  Alger Simons 06/21/2020, 7:57 AM

## 2020-06-21 NOTE — Procedures (Signed)
Patient Name: Taylor Castaneda  MRN: [id number]  Epilepsy Attending: Lora Havens  Referring Physician/Provider: Clance Boll, NP Date: 06/21/2020 Duration: 24.37 mins  Patient history: 87 y.o. female with a history of multiple recurrent spells of uncertain etiology.  EEG to evaluate for seizure  Level of alertness: Awake, asleep  AEDs during EEG study: LEV  Technical aspects: This EEG study was done with scalp electrodes positioned according to the 10-20 International system of electrode placement. Electrical activity was acquired at a sampling rate of 500Hz  and reviewed with a high frequency filter of 70Hz  and a low frequency filter of 1Hz . EEG data were recorded continuously and digitally stored.   Description: The posterior dominant rhythm consists of 7.5-8 Hz activity of moderate voltage (25-35 uV) seen predominantly in posterior head regions, symmetric and reactive to eye opening and eye closing. Sleep was characterized by vertex waves, sleep spindles (12 to 14 Hz), maximal frontocentral region. EEG showed intermittent generalized 3 to 6 Hz theta-delta slowing. Physiologic photic driving was not seen during photic stimulation. Hyperventilation was not performed.     ABNORMALITY -Intermittent slow, generalized  IMPRESSION: This study is suggestive of mild diffuse encephalopathy, nonspecific etiology. No seizures or epileptiform discharges were seen throughout the recording.  [name] Barbra Sarks

## 2020-06-21 NOTE — Progress Notes (Signed)
[place] PHYSICAL MEDICINE & REHABILITATION PROGRESS NOTE   Subjective/Complaints:  Creat up vs prior , recorded oral intake only 348ml Has been on increased dose of Lasix since 1/14   ROS: Patient denies CP, +SOB when getting OOB, no N/V/D   Objective:   No results found. Recent Labs    06/19/20 0458  WBC 8.8  HGB 8.4*  HCT 25.3*  PLT 213   Recent Labs    06/20/20 0518  NA 140  K 3.7  CL 101  CO2 27  GLUCOSE 39*  BUN 22  CREATININE 1.41*  CALCIUM 8.3*    Intake/Output Summary (Last 24 hours) at 06/21/2020 0834 Last data filed at 06/21/2020 1962 Gross per 24 hour  Intake 400 ml  Output -  Net 400 ml     Pressure Injury 06/10/20 Coccyx Medial Stage 1 -  Intact skin with non-blanchable redness of a localized area usually over a bony prominence. (Active)  06/10/20 2200  Location: Coccyx  Location Orientation: Medial  Staging: Stage 1 -  Intact skin with non-blanchable redness of a localized area usually over a bony prominence.  Wound Description (Comments):   Present on Admission: Yes    Physical Exam: Vital Signs Blood pressure (!) 175/62, pulse 79, temperature 99.6 F (37.6 C), resp. rate 18, height 5' (1.524 m), weight 56.9 kg, SpO2 98 %.  General: No acute distress Mood and affect are appropriate Heart: Regular rate and rhythm no rubs murmurs or extra sounds Lungs: Clear to auscultation, breathing unlabored, no rales or wheezes Abdomen: Positive bowel sounds, soft nontender to palpation, nondistended Extremities: No clubbing, cyanosis, or edema Skin: No evidence of breakdown, no evidence of rash    Skin: right wrist wound with vaseline gauze and kerlix--drainage much improved Neuro: oriented x3, language more fluent by the day. Reasonable insight and awareness. RUE 4+/5 RLE 4+/5. LUE and LLE 4/5. No focal sensory deficits appreciated Musculoskeletal: no tenderness over sacrum       Assessment/Plan: 1. Functional deficits which require 3+  hours per day of interdisciplinary therapy in a comprehensive inpatient rehab setting.  Physiatrist is providing close team supervision and 24 hour management of active medical problems listed below.  Physiatrist and rehab team continue to assess barriers to discharge/monitor patient progress toward functional and medical goals  Care Tool:  Bathing    Body parts bathed by patient: Right arm,Left arm,Chest,Abdomen (UB only today)   Body parts bathed by helper: Buttocks,Right lower leg,Left lower leg     Bathing assist Assist Level: Supervision/Verbal cueing     Upper Body Dressing/Undressing Upper body dressing Upper body dressing/undressing activity did not occur (including orthotics): Safety/medical concerns What is the patient wearing?: Pull over shirt    Upper body assist Assist Level: Minimal Assistance - Patient > 75%    Lower Body Dressing/Undressing Lower body dressing      What is the patient wearing?: Pants     Lower body assist Assist for lower body dressing: Minimal Assistance - Patient > 75%     Toileting Toileting    Toileting assist Assist for toileting: Moderate Assistance - Patient 50 - 74%     Transfers Chair/bed transfer  Transfers assist     Chair/bed transfer assist level: Contact Guard/Touching assist     Locomotion Ambulation   Ambulation assist      Assist level: Contact Guard/Touching assist Assistive device: Walker-rolling Max distance: 166ft   Walk 10 feet activity   Assist  Walk 10 feet activity did  not occur:  (knee pain and s.o.b)  Assist level: Contact Guard/Touching assist Assistive device: Walker-rolling   Walk 50 feet activity   Assist Walk 50 feet with 2 turns activity did not occur: Safety/medical concerns  Assist level: Contact Guard/Touching assist Assistive device: Walker-rolling    Walk 150 feet activity   Assist Walk 150 feet activity did not occur: Safety/medical concerns         Walk 10  feet on uneven surface  activity   Assist Walk 10 feet on uneven surfaces activity did not occur: Safety/medical concerns         Wheelchair     Assist Will patient use wheelchair at discharge?: No             Wheelchair 50 feet with 2 turns activity    Assist            Wheelchair 150 feet activity     Assist          Blood pressure (!) 175/62, pulse 79, temperature 99.6 F (37.6 C), resp. rate 18, height 5' (1.524 m), weight 56.9 kg, SpO2 98 %.  Medical Problem List and Plan: 1.Decreased functional ability with aphasiasecondary to several punctate right centrum semiovale infarct likely due to severe intracranial stenosis -patient may Shower  -ELOS/Goals: 7-10 days, Mod I with PT, OT and min assist with SLP  -may resume full therapy , as discussed with PT check ortho vitals  -Continue CIR- team conf in am  2. Antithrombotics: -DVT/anticoagulation:Subcutaneous heparin switched to lovenox- will hold givne severe anemia  -antiplatelet therapy: Aspirin 325 mg daily and Plavix 75 mg daily x3 months then Plavix alone 3. Pain Management:D/c Tramadol since she denies pain and has AKI. Chronic sacral fx asymptomatic  4. Mood:Provide emotional support -antipsychotic agents: N/A 5. Neuropsych: This patientiscapable of making decisions on herown behalf. 6. Skin/Wound Care:Routine skin checks 7. Fluids/Electrolytes/Nutrition:Routine in and outs with follow-up chemistries BMET Monday , monitor K+ and Cr given AKI 8. Cholelithiasis/choledocholithiasis. Status post ERCP followed by laparoscopic cholecystectomy 06/01/2020 -pt still denies pain 9. Seizure disorder. Keppra 500 mg twice daily. EEG negative 10. Hypertension. Altace 5 mg daily, Coreg 12.5 mg twice daily. -1/9 SBP has been slightly labile but want to avoid hypoperfusion--- Vitals:   06/20/20 1938 06/21/20 0420  BP:  (!) 162/62 (!) 175/62  Pulse: 79 79  Resp: 16 18  Temp: 98.3 F (36.8 C) 99.6 F (37.6 C)  SpO2: 100% 98%  change furosemide to qam, 40mg  11. Diabetes mellitus. Hemoglobin A1c 8.7. Lantus insulin 8 units nightly---pt takes 16u at bedtime at home    CBG (last 3)  Recent Labs    06/20/20 1638 06/20/20 2109 06/21/20 0550  GLUCAP 167* 168* 123*  hypoglycemia, reduce lantus and make sure pt gets hs snack 12. Hyperlipidemia. Lipitor 13. History of colon cancer with resection 1998. Follow-up as outpatient  14.  Leukocytosis no fever monitor for now   1/16: WBC down to 8.8, routine CBC ordered for tomorrow  15.  DOE- likely deconditioning plus anemia, will monitor in therapy , check CXR to look for pulm edema pulm HTN , off lasix 16,  ABLA- appreciate GI note no clear cut signs of GIB- await post transfusion Hgb   GI suggested possible retroperitoneal bleed- no evidence of bleed      1/16 Hgb 8.4, stable- as discussed with pt and son, some of drop may have been   LOS: 11 days A FACE TO FACE EVALUATION WAS PERFORMED  Luanna Salk Kirsteins 06/21/2020, 8:34 AM

## 2020-06-21 NOTE — Progress Notes (Signed)
Patient found by therapy unable to produce words and not acting herself, after evaluation of patient code stroke was call due to increased confusion and increased weakness.

## 2020-06-21 NOTE — Progress Notes (Signed)
Physical Therapy Session Note  Patient Details  Name: Taylor Castaneda MRN: [id number] Date of Birth: 1933-03-14  Today's Date: 06/21/2020 PT Missed Time: 46 Minutes Missed Time Reason: MD hold (Comment) (bedrest)  Short Term Goals: Week 1:  PT Short Term Goal 1 (Week 1): = to LTGs based on ELOS PT Short Term Goal 1 - Progress (Week 1): Progressing toward goal Week 2:  PT Short Term Goal 1 (Week 2): STG=LTG due to ELOS  Skilled Therapeutic Interventions/Progress Updates:   Spoke with charge nurse who stated that per MD, pt remains on bedrest but will be able to resume therapies tomorrow. Will attempt to make up time as able. 45 minutes missed of skilled physical therapy due to MD hold with pt on bedrest.   Therapy Documentation Precautions:  Precautions Precautions: Fall,Other (comment) Precaution Comments: monitor vitals with activity, hx of seizures Restrictions Weight Bearing Restrictions: No  Therapy/Group: Individual Therapy Alfonse Alpers PT, DPT   06/21/2020, 7:17 AM

## 2020-06-21 NOTE — Consult Note (Signed)
Neurology Code Stroke Consult H&P  CC: CODE STROKE  History is obtained from: RN, chart, patient  HPI: Taylor Castaneda is a 87 y.o. female with a PMHx of DM II, HLD, seizure disorder, colon cancer s/p resection and anemia. Patient was admitted on 05/30/21 after passing out at her condo. Daughter found her on the floor. When EMS arrived, patient had a witnessed generalized seizure with urinary incontinence lasting 1 min before resolved. Prior, patient reported no seizure in over a year. She is on Keppra at home. Family hx of stroke in father, no personal hx of stroke. Of note, patient had difficulty word finding on 06/01/20 and a code stroke was called. Stroke workup at that time revealed acute vs subacute CVA and patient was started on Plavix and ASA.  MRA brain revealed advanced intracranial atherosclerosis without LVO, M2 severe stenosis and left ICA stenosis. Pt was hypoglycemic at 0500 hrs, but not on recheck.   Pt underwent cholecystectomy on 05/31/20 for cholelithiasis.   Patient was transferred to CIR on 06/10/20.   Of note, pt was admitted 03/2019 for first time seizure and seen by neurology. Keppra 500mg  po bid was started at that time.   This a.m., RN reports that pt was speaking normally on med pass at 0840 hrs. However, when PT arrived at 0915 hrs, pt was having difficulty getting her words out and with following commands. No seizure activity was noted. CODE STROKE was called.   Son stated patient had difficulty speaking at 0730 hrs on his arrival, but RN said differently.   Upon arrival to room, patient was on toilet. She was helped back to bed and could stand with 2 person assistance. She knows her age but perseverates examiner's name and also on orientation and serial naming. She displays global aphasia. Per patient, she has had difficulty speaking several times in the past few months, but she thinks today is worse.   Patient taken emergently to CT where CTH, CTA head and neck, and CT  perfusion was accomplished.    LKW: Unclear, at least abnormal at 730 on son's arrival, though worsened after that tpa given?: No, not a candidate IR Thrombectomy? No  NIHSS:  1a Level of Conscious:0 1b LOC Questions: 0 1c LOC Commands: 0 2 Best Gaze: 0 3 Visual: 0 4 Facial Palsy: 0 5a Motor Arm - left: 0 5b Motor Arm - Right: 1 6a Motor Leg - Left: 0 6b Motor Leg - Right: 0 7 Limb Ataxia: 1 8 Sensory: 0 9 Best Language: 1 10 Dysarthria: 0 11 Extinct. and Inatten: 0  TOTAL: 3  ROS: A complete ROS was unable to be performed due to emergent nature of event.   Past Medical History:  Diagnosis Date  . Anemia   . Diabetes mellitus, type II ([place])   . Glaucoma    Dr.Hecker  . History of colon cancer 1998   Dr. Lennie Hummer  . Hyperlipemia   . Osteopenia 02/2012, 03/2014   DEXA hip -2.2  . Renal insufficiency   . Seizures ([place])   . Stenosing tenosynovitis of finger of left hand 2016   index - s/p steroid injection x2    Family History  Problem Relation Age of Onset  . Heart attack Father 56  . Stroke Father 71  . Diabetes Father   . Hypertension Father   . Heart disease Father        before age 72  . Breast cancer Mother   . Cancer Mother   .  Diabetes Sister   . Cancer Sister   . Peripheral vascular disease Sister   . Breast cancer Sister   . Hypertension Son   . COPD Neg Hx   . Asthma Neg Hx   . Colon cancer Neg Hx   . Esophageal cancer Neg Hx   . Rectal cancer Neg Hx   . Stomach cancer Neg Hx     Social History:  reports that she quit smoking about 47 years ago. Her smoking use included cigarettes. She quit after 2.00 years of use. She has never used smokeless tobacco. She reports previous alcohol use. She reports that she does not use drugs.   Prior to Admission medications   Medication Sig Start Date End Date Taking? Authorizing Provider  acetaminophen (TYLENOL) 325 MG tablet Take 325-650 mg by mouth every 6 (six) hours as needed (for pain).     [provider]  aspirin EC 325 MG EC tablet Take 1 tablet (325 mg total) by mouth daily. 06/11/20   Dessa Phi, DO  atorvastatin (LIPITOR) 40 MG tablet Take 1 tablet (40 mg total) by mouth daily at 6 PM. 02/19/20   [name], MD  B-D ULTRAFINE III SHORT PEN 31G X 8 MM MISC USE AS DIRECTED WITH INSULIN 07/09/11   Hendricks Limes, MD  Biotin 10000 MCG TABS Take 10,000 mcg by mouth daily.    [provider]  Carboxymethylcellul-Glycerin 1-0.25 % SOLN Place 1-2 drops into both eyes 3 (three) times daily as needed (for dry/irritated eyes.).    [provider]  carvedilol (COREG) 12.5 MG tablet Take 1 tablet (12.5 mg total) by mouth 2 (two) times daily. 02/19/20   [name], MD  Cholecalciferol (VITAMIN D3) 25 MCG (1000 UT) CAPS Take 1 capsule (1,000 Units total) by mouth daily. Patient taking differently: Take 2,000 Units by mouth daily. 03/18/19   [name], MD  Cinnamon 500 MG capsule Take 1,000 mg by mouth daily.    [provider]  clopidogrel (PLAVIX) 75 MG tablet Take 1 tablet (75 mg total) by mouth daily. 06/11/20   Dessa Phi, DO  colchicine 0.6 MG tablet Take 1 tablet (0.6 mg total) by mouth daily as needed (gout flare). First day of gout flare, may take 1 tablet twice daily. Patient taking differently: Take 0.6 mg by mouth daily as needed (for gout flares, and may take 0.6 mg TWICE DAILY on 1st day of flare). 02/19/20   [name], MD  Ferrous Sulfate (IRON) 325 (65 Fe) MG TABS Take 325 mg by mouth See admin instructions. Take 325 mg by mouth in the evening on Mondays and Thursdays only    [provider]  HUMALOG KWIKPEN 100 UNIT/ML KiwkPen Inject 1-10 Units into the skin See admin instructions. Inject 1-10 units into the skin three times a day with meals, PER SLIDING SCALE 10/04/16   [provider]  LANTUS SOLOSTAR 100 UNIT/ML Solostar Pen Inject 8 Units into the skin at bedtime. 06/10/20   Dessa Phi, DO   levETIRAcetam (KEPPRA) 500 MG tablet Take 1 tablet (500 mg total) by mouth 2 (two) times daily. 06/10/20   Dessa Phi, DO  Multiple Vitamins-Minerals (PRESERVISION AREDS 2 PO) Take 1 capsule by mouth 2 (two) times daily.    [provider]  potassium chloride (KLOR-CON) 10 MEQ tablet TAKE 1 TABLET BY MOUTH ON MONDAY, [place] Patient taking differently: Take 10 mEq by mouth every Monday, Wednesday, and Friday. 04/20/20   [name], MD  ramipril (ALTACE) 5 MG capsule Take 1 capsule by mouth once daily Patient taking differently: Take 5 mg by mouth daily. 05/13/20   [name], MD   Meds of neurological significance: ASA 325mg  daily, LIpitor 40mg  daily, Plavix 75mg  daily, Keppra 500mg  bid.   Exam: Current vital signs: BP (!) 160/68 (BP Location: Left Arm)   Pulse 87   Temp 99 F (37.2 C) (Oral)   Resp 18   Ht 5' (1.524 m)   Wt 56.9 kg   SpO2 100%   BMI 24.51 kg/m   Physical Exam  Constitutional: Appears well-developed and well-nourished.  Psych: Affect appropriate to situation Eyes: No scleral injection HENT: No OP obstrucion Head: Normocephalic.  Cardiovascular: RRR Respiratory: Effort normal. GI: Soft.  Skin: WDI  Neuro: Mental Status: Patient is awake, alert, but perseverates on remainded orientation questions. Patient is unable to give a complete history.  No signs of neglect. Global aphasia noted.  Speech/Language: Speech without dysarthria. Comprehension is intact for simple commands, but not more complicated tasks. She perseverates with repeating, naming and reading of words.   Cranial Nerves: II: Visual Fields are full. Pupils are equal, round, and reactive to light. III,IV, VI: EOMI without ptosis or diploplia.  V: Facial sensation is symmetric to light touch. Able to move jaw back and forth.  VII: Smile is symmetrical.  VIII: hearing is intact to voice IX, X: Uvula elevates symmetrically. Phonation normal.  XI: Shoulder  shrug is symmetric. XII: tongue is midline without atrophy or fasciculations.  Motor: Tone is normal. Bulk is normal. Grips are equal. Dorsiflexion and plantar flexion are 5/5 bilaterally. Does not comprehend rest of strength commands.  Sensory: Sensation is symmetric to light touch in the arms and legs.Extinction intact.  Plantars: Toes are downgoing bilaterally. Cerebellar: Drift RUE. None elsewhere. No ataxia noted FNF on right, but slightly on left, but questioned due to comprehension of directions.  Gait: Observed patient transferring from wheelchair to bed. 2 person assistance but could support weight on legs. Did not observe gait.    I have reviewed labs in epic and the pertinent results DVV:OHYW: TSH 1.712, HbA1c 7.9, WBCC 8.8, creat 1.41 on this hospitalization. LDL 60 9/21.   Dr. Leonel Ramsay independently reviewed the images obtained:  NCT head showed no acute abnormality.   CTA head and neck showed   Pt seen by Clance Boll, NP and MD. Note/plan to be edited by MD as necessary.  Pager: [phone]  I have seen the patient and reviewed the above note.  She developed aphasia with mild right arm and facial that was improving at the time I left the patient.  Her onset is slightly unclear given that she already had symptoms when her son was there, and her symptoms were mild and may have been missed by the nurse at shift change.   Assessment: Taylor Castaneda is a 87 y.o. female with a history of multiple recurrent spells of uncertain etiology.  Given the number of spells as well as the semiology described on previous hospitalizations there has been concern for seizure as a possible etiology and she has been started on Keppra for these. Her spell today seems similar to previously described spells, though there was no definite clonic activity or eye fluttering or tremulousness.  She does have a left M2 stenosis and transient focal hypoperfusion could cause symptoms such as what she  experienced today, however she did not have any hypotension or relative perfusion deficit on CT perfusion.  At this  point, I think that a small increase in her Keppra may be reasonable if no other etiology is found on MRI.  Plan: - MRI brain without contrast. - continue statin. Recheck LDL in 3-6 mos.  - patient is already Aspirin 325mg  daily. - patient is already on Clopidogrel 75mg  daily. - continue Keppra 500mg  po bid for now, if MRI is negative, may consider increasing this. - Given fever today, recommend metabolic/infectious workup with UA with UCx, CXR, CK, serum lactate. - EEG   This patient is critically ill and at significant risk of neurological worsening, death and care requires constant monitoring of vital signs, hemodynamics,respiratory and cardiac monitoring, neurological assessment, discussion with family, other specialists and medical decision making of high complexity. I spent 50 minutes of neurocritical care time  in the care of  this patient. This was time spent independent of any time provided by nurse practitioner or PA.  Roland Rack, MD [facility] [phone]  If 7pm- 7am, please page neurology on call as listed in [place].

## 2020-06-22 ENCOUNTER — Inpatient Hospital Stay (HOSPITAL_COMMUNITY): Payer: Medicare Other | Admitting: Physical Therapy

## 2020-06-22 ENCOUNTER — Inpatient Hospital Stay (HOSPITAL_COMMUNITY): Payer: Medicare Other | Admitting: Occupational Therapy

## 2020-06-22 ENCOUNTER — Inpatient Hospital Stay (HOSPITAL_COMMUNITY): Payer: Medicare Other | Admitting: Speech Pathology

## 2020-06-22 LAB — BASIC METABOLIC PANEL
Anion gap: 10 (ref 5–15)
BUN: 28 mg/dL — ABNORMAL HIGH (ref 8–23)
CO2: 29 mmol/L (ref 22–32)
Calcium: 8.1 mg/dL — ABNORMAL LOW (ref 8.9–10.3)
Chloride: 98 mmol/L (ref 98–111)
Creatinine, Ser: 1.68 mg/dL — ABNORMAL HIGH (ref 0.44–1.00)
GFR, Estimated: 30 mL/min — ABNORMAL LOW (ref >60)
Glucose, Bld: 104 mg/dL — ABNORMAL HIGH (ref 70–99)
Potassium: 3.3 mmol/L — ABNORMAL LOW (ref 3.5–5.1)
Sodium: 137 mmol/L (ref 135–145)

## 2020-06-22 LAB — GLUCOSE, CAPILLARY
Glucose-Capillary: 107 mg/dL — ABNORMAL HIGH (ref 70–99)
Glucose-Capillary: 272 mg/dL — ABNORMAL HIGH (ref 70–99)
Glucose-Capillary: 179 mg/dL — ABNORMAL HIGH (ref 70–99)
Glucose-Capillary: 286 mg/dL — ABNORMAL HIGH (ref 70–99)

## 2020-06-22 MED ORDER — FUROSEMIDE 20 MG PO TABS
10.0000 mg | ORAL_TABLET | Freq: Every day | ORAL | Status: DC
  Administered 2020-06-23 – 2020-06-24 (×2): 10 mg via ORAL
  Filled 2020-06-22 (×2): qty 1

## 2020-06-22 MED ORDER — POTASSIUM CHLORIDE CRYS ER 10 MEQ PO TBCR
10.0000 meq | EXTENDED_RELEASE_TABLET | Freq: Every day | ORAL | Status: AC
  Administered 2020-06-22 – 2020-06-23 (×2): 10 meq via ORAL
  Filled 2020-06-22 (×2): qty 1

## 2020-06-22 MED ORDER — LEVETIRACETAM 750 MG PO TABS
750.0000 mg | ORAL_TABLET | Freq: Two times a day (BID) | ORAL | Status: DC
  Administered 2020-06-22 – 2020-06-24 (×4): 750 mg via ORAL
  Filled 2020-06-22 (×4): qty 1

## 2020-06-22 NOTE — Progress Notes (Signed)
STROKE TEAM PROGRESS NOTE   INTERVAL HISTORY No acute events overnight. Son is at bedside. He feels her speech is much improved today.  MRI scan of the brain shows increase foci of restricted diffusion within the deep white matter of the right frontal and parietal region which have worsened since the previous MRI from 06/03/2020.  EEG done yesterday suggestive of mild diffuse encephalopathy no definite seizure activity.  Previous EEG done on 06/02/2020 had shown intermittent right greater than left temporal  2 to 3 Hz delta slowing.   Vitals:   06/21/20 2024 06/21/20 2240 06/22/20 0426 06/22/20 0818  BP: (!) 114/44 (!) 134/40 135/61 (!) 119/46  Pulse: 67 64 64 66  Resp: 20 18 18    Temp:   97.9 F (36.6 C)   TempSrc:      SpO2: 100% 98% 98%   Weight:      Height:       CBC:  Recent Labs  Lab 06/16/20 0456 06/19/20 0458  WBC 10.0 8.8  HGB 8.5* 8.4*  HCT 25.6* 25.3*  MCV 97.3 96.9  PLT 183 188   Basic Metabolic Panel:  Recent Labs  Lab 06/20/20 0518 06/22/20 0711  NA 140 137  K 3.7 3.3*  CL 101 98  CO2 27 29  GLUCOSE 39* 104*  BUN 22 28*  CREATININE 1.41* 1.68*  CALCIUM 8.3* 8.1*   Lipid Panel: LDL 206 on 02/26/2020     IMAGING past 24 hours CT Code Stroke CTA Head W/WO contrast  Result Date: 06/21/2020 CLINICAL DATA:  Focal neurological deficit, stroke suspected. Aphasia. EXAM: CT ANGIOGRAPHY HEAD AND NECK CT PERFUSION BRAIN TECHNIQUE: Multidetector CT imaging of the head and neck was performed using the standard protocol during bolus administration of intravenous contrast. Multiplanar CT image reconstructions and MIPs were obtained to evaluate the vascular anatomy. Carotid stenosis measurements (when applicable) are obtained utilizing NASCET criteria, using the distal internal carotid diameter as the denominator. Multiphase CT imaging of the brain was performed following IV bolus contrast injection. Subsequent parametric perfusion maps were calculated using RAPID  software. CONTRAST:  144mL OMNIPAQUE IOHEXOL 350 MG/ML SOLN COMPARISON:  None. Head CT June 21, 2020. FINDINGS: CTA NECK FINDINGS Aortic arch: Standard branching. Imaged portion shows no evidence of aneurysm or dissection. Calcified plaques are noted in the aortic arch. No significant stenosis of the major arch vessel origins. Right carotid system: Mild atherosclerotic changes in the right carotid bifurcation. No evidence of dissection, stenosis (50% or greater) or occlusion. Left carotid system: Mild atherosclerotic changes in the left carotid bifurcation. No evidence of dissection, stenosis (50% or greater) or occlusion. Vertebral arteries: Right dominant. No evidence of dissection, stenosis (50% or greater) or occlusion. Skeleton: Degenerative changes of the cervical spine. No aggressive bone lesion identified. Other neck: Negative. Upper chest: Bilateral pleural effusion. Prominent (11 mm) paratracheal lymph node (series 3, image 157). Review of the MIP images confirms the above findings CTA HEAD FINDINGS Anterior circulation: Calcified plaques in the bilateral carotid siphons without hemodynamically significant stenosis. Mild stenosis of the supraclinoid segment of the left ICA. Early bifurcation of the bilateral M1/MCA segments noted with moderate stenosis at the origin of the left M2/MCA posterior division branch (series 9, image 19). No proximal occlusion, aneurysm, or vascular malformation. Posterior circulation: Patent V4 segment of the bilateral vertebral arteries. The basilar artery has small caliber throughout its course which may be related to the presence of bilateral fetal PCAs. The bilateral posterior cerebral arteries which originate directly from the  corresponding ICAs, show mild luminal irregularity likely related to intracranial atherosclerotic disease without hemodynamically significant stenosis. Venous sinuses: As permitted by contrast timing, patent. Anatomic variants: Bilateral fetal  PCAs. Review of the MIP images confirms the above findings CT Brain Perfusion Findings: ASPECTS: 10 CBF (<30%) Volume: 59mL Perfusion (Tmax>6.0s) volume: 73mL Mismatch Volume: 29mL Infarction Location:Not applicable. IMPRESSION: 1. No large vessel occlusion. No core infarct or penumbra. 2. Mild atherosclerotic changes in the bilateral carotid bifurcations. No hemodynamically significant stenosis in the neck. 3. Moderate stenosis at the origin of the left M2/MCA posterior division branch. 4. Small caliber basilar artery which may be related to the presence of bilateral fetal PCAs. 5. Bilateral pleural effusion. 6. Prominent paratracheal lymph node, nonspecific. These results were called by telephone at the time of interpretation on 06/21/2020 at 10:59 am to provider MCNEILL [facility] , who verbally acknowledged these results. Aortic Atherosclerosis (ICD10-I70.0). Electronically Signed   By: Pedro Earls M.D.   On: 06/21/2020 10:59   CT Code Stroke CTA Neck W/WO contrast  Result Date: 06/21/2020 CLINICAL DATA:  Focal neurological deficit, stroke suspected. Aphasia. EXAM: CT ANGIOGRAPHY HEAD AND NECK CT PERFUSION BRAIN TECHNIQUE: Multidetector CT imaging of the head and neck was performed using the standard protocol during bolus administration of intravenous contrast. Multiplanar CT image reconstructions and MIPs were obtained to evaluate the vascular anatomy. Carotid stenosis measurements (when applicable) are obtained utilizing NASCET criteria, using the distal internal carotid diameter as the denominator. Multiphase CT imaging of the brain was performed following IV bolus contrast injection. Subsequent parametric perfusion maps were calculated using RAPID software. CONTRAST:  174mL OMNIPAQUE IOHEXOL 350 MG/ML SOLN COMPARISON:  None. Head CT June 21, 2020. FINDINGS: CTA NECK FINDINGS Aortic arch: Standard branching. Imaged portion shows no evidence of aneurysm or dissection. Calcified plaques  are noted in the aortic arch. No significant stenosis of the major arch vessel origins. Right carotid system: Mild atherosclerotic changes in the right carotid bifurcation. No evidence of dissection, stenosis (50% or greater) or occlusion. Left carotid system: Mild atherosclerotic changes in the left carotid bifurcation. No evidence of dissection, stenosis (50% or greater) or occlusion. Vertebral arteries: Right dominant. No evidence of dissection, stenosis (50% or greater) or occlusion. Skeleton: Degenerative changes of the cervical spine. No aggressive bone lesion identified. Other neck: Negative. Upper chest: Bilateral pleural effusion. Prominent (11 mm) paratracheal lymph node (series 3, image 157). Review of the MIP images confirms the above findings CTA HEAD FINDINGS Anterior circulation: Calcified plaques in the bilateral carotid siphons without hemodynamically significant stenosis. Mild stenosis of the supraclinoid segment of the left ICA. Early bifurcation of the bilateral M1/MCA segments noted with moderate stenosis at the origin of the left M2/MCA posterior division branch (series 9, image 19). No proximal occlusion, aneurysm, or vascular malformation. Posterior circulation: Patent V4 segment of the bilateral vertebral arteries. The basilar artery has small caliber throughout its course which may be related to the presence of bilateral fetal PCAs. The bilateral posterior cerebral arteries which originate directly from the corresponding ICAs, show mild luminal irregularity likely related to intracranial atherosclerotic disease without hemodynamically significant stenosis. Venous sinuses: As permitted by contrast timing, patent. Anatomic variants: Bilateral fetal PCAs. Review of the MIP images confirms the above findings CT Brain Perfusion Findings: ASPECTS: 10 CBF (<30%) Volume: 34mL Perfusion (Tmax>6.0s) volume: 26mL Mismatch Volume: 79mL Infarction Location:Not applicable. IMPRESSION: 1. No large vessel  occlusion. No core infarct or penumbra. 2. Mild atherosclerotic changes in the bilateral carotid bifurcations. No  hemodynamically significant stenosis in the neck. 3. Moderate stenosis at the origin of the left M2/MCA posterior division branch. 4. Small caliber basilar artery which may be related to the presence of bilateral fetal PCAs. 5. Bilateral pleural effusion. 6. Prominent paratracheal lymph node, nonspecific. These results were called by telephone at the time of interpretation on 06/21/2020 at 10:59 am to provider MCNEILL [facility] , who verbally acknowledged these results. Aortic Atherosclerosis (ICD10-I70.0). Electronically Signed   By: Pedro Earls M.D.   On: 06/21/2020 10:59   MR BRAIN WO CONTRAST  Result Date: 06/21/2020 CLINICAL DATA:  Neuro deficit, acute, stroke suspected. EXAM: MRI HEAD WITHOUT CONTRAST TECHNIQUE: Multiplanar, multiecho pulse sequences of the brain and surrounding structures were obtained without intravenous contrast. COMPARISON:  Head CT June 21, 2020 FINDINGS: Brain: Several foci of restricted diffusion are seen within the deep white matter of the right frontal parietal region, suggestive watershed infarcts. No hemorrhage or significant mass effect. No hydrocephalus, extra-axial collection or mass lesion. Scattered and confluent foci of T2 hyperintensity are seen within the white of the cerebral hemispheres, nonspecific, most likely related to chronic small vessel ischemia. Moderate parenchymal volume loss. Vascular: Normal flow voids. Skull and upper cervical spine: Normal marrow signal. Sinuses/Orbits: Bilateral lens surgery. Mild scattered mucosal thickening throughout the paranasal sinuses. IMPRESSION: 1. Several foci of restricted diffusion within the deep white matter of the right frontal parietal region, suggestive of watershed infarcts. 2. Moderate parenchymal volume loss and probable chronic small vessel ischemia. These results were called via  AMION paging system at the time of interpretation on 06/21/2020 at 5:09 pm to provider MCNEILL [facility]. Electronically Signed   By: Pedro Earls M.D.   On: 06/21/2020 17:10   CT Code Stroke Cerebral Perfusion with contrast  Result Date: 06/21/2020 CLINICAL DATA:  Focal neurological deficit, stroke suspected. Aphasia. EXAM: CT ANGIOGRAPHY HEAD AND NECK CT PERFUSION BRAIN TECHNIQUE: Multidetector CT imaging of the head and neck was performed using the standard protocol during bolus administration of intravenous contrast. Multiplanar CT image reconstructions and MIPs were obtained to evaluate the vascular anatomy. Carotid stenosis measurements (when applicable) are obtained utilizing NASCET criteria, using the distal internal carotid diameter as the denominator. Multiphase CT imaging of the brain was performed following IV bolus contrast injection. Subsequent parametric perfusion maps were calculated using RAPID software. CONTRAST:  160mL OMNIPAQUE IOHEXOL 350 MG/ML SOLN COMPARISON:  None. Head CT June 21, 2020. FINDINGS: CTA NECK FINDINGS Aortic arch: Standard branching. Imaged portion shows no evidence of aneurysm or dissection. Calcified plaques are noted in the aortic arch. No significant stenosis of the major arch vessel origins. Right carotid system: Mild atherosclerotic changes in the right carotid bifurcation. No evidence of dissection, stenosis (50% or greater) or occlusion. Left carotid system: Mild atherosclerotic changes in the left carotid bifurcation. No evidence of dissection, stenosis (50% or greater) or occlusion. Vertebral arteries: Right dominant. No evidence of dissection, stenosis (50% or greater) or occlusion. Skeleton: Degenerative changes of the cervical spine. No aggressive bone lesion identified. Other neck: Negative. Upper chest: Bilateral pleural effusion. Prominent (11 mm) paratracheal lymph node (series 3, image 157). Review of the MIP images confirms the above  findings CTA HEAD FINDINGS Anterior circulation: Calcified plaques in the bilateral carotid siphons without hemodynamically significant stenosis. Mild stenosis of the supraclinoid segment of the left ICA. Early bifurcation of the bilateral M1/MCA segments noted with moderate stenosis at the origin of the left M2/MCA posterior division branch (series 9, image 19).  No proximal occlusion, aneurysm, or vascular malformation. Posterior circulation: Patent V4 segment of the bilateral vertebral arteries. The basilar artery has small caliber throughout its course which may be related to the presence of bilateral fetal PCAs. The bilateral posterior cerebral arteries which originate directly from the corresponding ICAs, show mild luminal irregularity likely related to intracranial atherosclerotic disease without hemodynamically significant stenosis. Venous sinuses: As permitted by contrast timing, patent. Anatomic variants: Bilateral fetal PCAs. Review of the MIP images confirms the above findings CT Brain Perfusion Findings: ASPECTS: 10 CBF (<30%) Volume: 67mL Perfusion (Tmax>6.0s) volume: 74mL Mismatch Volume: 32mL Infarction Location:Not applicable. IMPRESSION: 1. No large vessel occlusion. No core infarct or penumbra. 2. Mild atherosclerotic changes in the bilateral carotid bifurcations. No hemodynamically significant stenosis in the neck. 3. Moderate stenosis at the origin of the left M2/MCA posterior division branch. 4. Small caliber basilar artery which may be related to the presence of bilateral fetal PCAs. 5. Bilateral pleural effusion. 6. Prominent paratracheal lymph node, nonspecific. These results were called by telephone at the time of interpretation on 06/21/2020 at 10:59 am to provider MCNEILL [facility] , who verbally acknowledged these results. Aortic Atherosclerosis (ICD10-I70.0). Electronically Signed   By: Pedro Earls M.D.   On: 06/21/2020 10:59   EEG adult  Result Date:  06/21/2020 Lora Havens, MD     06/21/2020  4:08 PM Patient Name: Taylor Castaneda MRN: [id number] Epilepsy Attending: Lora Havens Referring Physician/Provider: Clance Boll, NP Date: 06/21/2020 Duration: 24.37 mins Patient history: 87 y.o. female with a history of multiple recurrent spells of uncertain etiology.  EEG to evaluate for seizure Level of alertness: Awake, asleep AEDs during EEG study: LEV Technical aspects: This EEG study was done with scalp electrodes positioned according to the 10-20 International system of electrode placement. Electrical activity was acquired at a sampling rate of 500Hz  and reviewed with a high frequency filter of 70Hz  and a low frequency filter of 1Hz . EEG data were recorded continuously and digitally stored. Description: The posterior dominant rhythm consists of 7.5-8 Hz activity of moderate voltage (25-35 uV) seen predominantly in posterior head regions, symmetric and reactive to eye opening and eye closing. Sleep was characterized by vertex waves, sleep spindles (12 to 14 Hz), maximal frontocentral region. EEG showed intermittent generalized 3 to 6 Hz theta-delta slowing. Physiologic photic driving was not seen during photic stimulation. Hyperventilation was not performed.   ABNORMALITY -Intermittent slow, generalized IMPRESSION: This study is suggestive of mild diffuse encephalopathy, nonspecific etiology. No seizures or epileptiform discharges were seen throughout the recording. Lora Havens   CT HEAD CODE STROKE WO CONTRAST  Result Date: 06/21/2020 CLINICAL DATA:  Code stroke.  Acute neuro deficit.  Aphasia EXAM: CT HEAD WITHOUT CONTRAST TECHNIQUE: Contiguous axial images were obtained from the base of the skull through the vertex without intravenous contrast. COMPARISON:  CT head 05/29/2020 FINDINGS: Brain: Moderate atrophy. Patchy white matter hypodensity bilaterally stable. Chronic infarct chronic lacunar infarction in the right internal capsule  unchanged. Negative for acute infarct, hemorrhage, mass. Vascular: Negative for hyperdense vessel. Skull: Negative Sinuses/Orbits: Paranasal sinuses clear. Bilateral cataract extraction. Other: None ASPECTS ([place] Stroke Program Early CT Score) - Ganglionic level infarction (caudate, lentiform nuclei, internal capsule, insula, M1-M3 cortex): 7 - Supraganglionic infarction (M4-M6 cortex): 3 Total score (0-10 with 10 being normal): 10 IMPRESSION: 1. No acute abnormality and no change from the recent study 2. Atrophy and chronic microvascular ischemia, stable 3. ASPECTS is 10 4. Code stroke imaging results were  communicated on 06/21/2020 at 10:09 am to provider Leonel Ramsay via text page Electronically Signed   By: Franchot Gallo M.D.   On: 06/21/2020 10:10   PHYSICAL EXAM Constitutional: Appears well-developed and well-nourished elderly Caucasian lady.  Psych: Affect appropriate to situation Eyes: No scleral injection HENT: No OP obstrucion Head: Normocephalic.  Cardiovascular: RRR Respiratory: Effort normal. GI: Soft.  Skin: WDI  Neuro: Mental Status: Patient is awake, alert, sitting up in chair at bedside. Speech/Language: Speech without dysarthria. Follows one step commands. Word finding difficulty is mild and much improved.  Slightly nonfluent speech but able to name repeat and comprehend quite well Names 2/2 objects.   Cranial Nerves: III,IV, VI: EOMI without ptosis or diploplia.  V: Facial sensation is symmetric to light touch. Able to move jaw back and forth.  VII: Smile is symmetrical.  VIII: hearing is intact to voice  Motor: Tone is normal. Bulk is normal. Grips are equal. Dorsiflexion and plantar flexion are 5/5 bilaterally. Does not comprehend rest of strength commands.  Sensory: Sensation is symmetric to light touch in the arms and legs.Extinction intact.  Plantars: Toes are downgoing bilaterally. Cerebellar: Drift RUE. None elsewhere. No ataxia noted FNF on right, but  slightly on left, but questioned due to comprehension of directions.  Gait: Observed patient transferring from wheelchair to bed. 2 person assistance but could support weight on legs. Did not observe gait.    ASSESSMENT/PLAN Ms. SUSA BONES is a 87 y.o. female with history of DM II, HLD, seizure disorder, colon cancer s/p resection and anemia with history of multiple recurrent spells of uncertain etiology with the most recent occurring yesterday. Patient had difficulty word finding on 06/01/20 and a code stroke was called. Stroke workup at that time revealed acute vs subacute CVA and patient was started on Plavix and ASA. Pt underwent cholecystectomy on 05/31/20 for cholelithiasis and was transferred to CIR on 06/10/20. Yesterday she had difficulty speaking around 0900. Code Stroke was called. She was noted to have transient global aphasia. Patient taken emergently to CT where CTH, CTA head and neck, and CT perfusion was accomplished.   Spells of aphasia without clear causation with stroke work up showing right sided restricted diffusion which are not explanatory for language impairment. During the last episode there was some concern for low blood pressure but during this episode patient was hypertensive to 160. Seizure seems more likely etiology.    Code Stroke CT head No acute abnormality. ASPECTS 10. CTA head & neck: No LVO. Moderate stenosis at the origin of the left M2/MCA posteriordivision branch. Small caliber basilar artery which may be related to the presence of bilateral fetal PCAs  CT perfusion: No LVO. No core infarct or penumbra.Moderate stenosis at the origin of the left M2/MCA posterior division branch. Small caliber basilar artery which may be related to the presence of bilateral fetal PCAs.  MRI Brain: Several foci of restricted diffusion within the deep white matterof the right frontal parietal region, suggestive of watershed infarcts. Moderate parenchymal volume loss and probable  chronic small vessel ischemia.  Carotid Doppler: Carotid Doppler from last stroke work up 05/29/20 with B ICA 1-39% stenosis, VAs antegrade  2D Echo from last stroke work up during 05/29/20 episode showed EF 60-65%  EEG: No seizure   MRA last stroke workup 05/29/20 showed Advanced atherosclerosis: severe R MCA bifurcation w/ M2 involvement, severe distal L ICA siphon and L MCA superior M2 branch origin, moderate R P  LDL 60  HgbA1c 7.9  VTE prophylaxis is recommended.   Continuation of ASA 325mg  and and Plavix 75 mg daily for 3 months and then plavix alone given severe intracranial stenosis as recommended by Dr. Erlinda Hong in December.  2021    Therapy recommendations:  CIR   Disposition:  Currently in CIR status  Hypertension . Long-term BP goal normotensive  Hyperlipidemia  Continue Atorvasatin 40mg  daily (recently restarted after LFTs normalized)   LDL 60, goal < 70  Continue statin at discharge  Diabetes type II Uncontrolled  Home meds:    HgbA1c 7.9, goal < 7.0  CBGs Recent Labs    06/21/20 1720 06/21/20 2139 06/22/20 0622  GLUCAP 229* 162* 107*      SSI   Seizure Disorder: Increase Keppra to 750mg  BID   Other Stroke Risk Factors   Advanced Age >/= 22   Former Cigarette smoker, quit 25 yrs ago  Family hx stroke (father)  Other Active Problems: Hypokalemia: Monitor and replete electrolytes  AKI, Monitor and maintain hydration Acute on chronic anemia followed by GI S/p cholecystectomy on 12/28  Recommend increase the dose of Keppra to 750 mg twice daily.  Continue stroke seen on the right hemisphere do not explain her speech abnormality which more likely represents seizures.  Follow-up as an outpatient in the stroke clinic.  Discussed with patient and son and answered questions.  Greater than 50% time during this 35-minute visit was spent on counseling and coordination of care and discussion with patient and answered questions.  Neurology will sign  off. Please call with questions. Follow up arranged with [facility].  Antony Contras, [facility] day # 12  To contact Stroke Continuity provider, please refer to [email or web address] After hours, contact General Neurology

## 2020-06-22 NOTE — Progress Notes (Signed)
Physical Therapy Session Note  Patient Details  Name: [name] MRN: [id number] Date of Birth: Oct 11, 1932  Today's Date: 06/22/2020 PT Individual Time: 0918-1000 PT Individual Time Calculation (min): 42 min   Short Term Goals: Week 2:  PT Short Term Goal 1 (Week 2): STG=LTG due to ELOS  Skilled Therapeutic Interventions/Progress Updates:   Pt received supine in bed and agreeable to PT. Son present for entire session for education Supine>sit transfer with min cues for decreased use of rail as able. PT performed Orthostatic VS  Assessment.  Sitting 109/78, HR 71 Standing 0 min 125/43 HR 73 Standing 2 min 130/50, HR 72.  Pt with only mild orthostasis s/s in standing initially, but resolved within 10 seconds.  Stand pivot transfer to [facility] with RW and CGA with tactile cues for step length on the L. Pt then transported to rehab gym in [facility]. Gait training with RW x 66f with supervision assist-CGA for safety and improved step length on the L. Son able to provide cues to encourage step length throughout.   Car transfer training with RW and CGA from PT with cues for step pattern and AD management in turn. [facility] and moderate cues for use of UE to decrease speed of transfer into sitting in WC. Pt noted with difficulty retaining instruction for one transfer to next requiring min-mod cues for safety for all transfers.   Additional gait training in simulated home environment of rehab training apartment with supervision assist x 171f cues continued to be required for step length on the L.   Patient returned to room and performed stand pivot to recliner with CGA for safety and improved  AD placement. Pt left sitting in recliner with call bell in reach and all needs met.          Therapy Documentation Precautions:  Precautions Precautions: Fall,Other (comment) Precaution Comments: monitor vitals with activity, hx of seizures Restrictions Weight Bearing  Restrictions: No    Vital Signs: Therapy Vitals Pulse Rate: 66 BP: (!) 119/46 Pain: Pain Assessment Pain Scale: 0-10 Pain Score: 0-No pain    Therapy/Group: Individual Therapy  AuLorie Phenix/19/2022, 11:48 AM

## 2020-06-22 NOTE — Progress Notes (Signed)
Occupational Therapy Session Note  Patient Details  Name: Taylor Castaneda MRN: [id number] Date of Birth: 11/12/1932  Today's Date: 06/22/2020 OT Individual Time: 1050-1130 OT Individual Time Calculation (min): 40 min    Short Term Goals: Week 1:  OT Short Term Goal 1 (Week 1): STG=LTG d/t ELOS OT Short Term Goal 1 - Progress (Week 1): Not met  Skilled Therapeutic Interventions/Progress Updates:    Treatment session with focus on functional transfers and sequencing/recall of education.  Pt received upright in recliner agreeable to therapy session, but declining bathing/dressing.  Pt completed sit > stand from recliner with CGA and cues for hand placement.  Pt required cues throughout session for hand placement during sit <> stand transfers as pt frequently attempting to pull up from RW and then when cued to push up from w/c pt then reaching for brakes and leg rests.  Completed walk-in shower transfers with RW in ADL apt.  Educated on stepping backwards over shower threshold for improved safety and sequencing with shower transfers.  Completed x3 with pt able to complete with CGA when stepping over shower ledge and supervision when ambulating straightaways.  Pt required mod cues for sequencing stepping pattern during transfers.  Pt returned to room and transferred back to recliner with supervision with RW.    Therapy Documentation Precautions:  Precautions Precautions: Fall,Other (comment) Precaution Comments: monitor vitals with activity, hx of seizures Restrictions Weight Bearing Restrictions: No Vital Signs: Therapy Vitals Pulse Rate: 66 BP: (!) 119/46 Pain: Pain Assessment Pain Scale: 0-10 Pain Score: 0-No pain   Therapy/Group: Individual Therapy  ,  06/22/2020, 12:12 PM 

## 2020-06-22 NOTE — Patient Care Conference (Signed)
Inpatient RehabilitationTeam Conference and Plan of Care Update Date: 06/22/2020   Time: 10:28 AM    Patient Name: Taylor Castaneda      Medical Record Number: [id number]  Date of Birth: 11-01-1932 Sex: Female         Room/Bed: 4W26C/4W26C-01 Payor Info: Payor: [place] / Plan: [employer] MEDICARE / Product Type: *No Product type* /    Admit Date/Time:  06/10/2020  3:40 PM  Primary Diagnosis:  Subcortical infarction [facility])  Hospital Problems: Principal Problem:   Subcortical infarction [facility]) Active Problems:   Pressure injury of skin   Melena    Expected Discharge Date: Expected Discharge Date: 06/24/20  Team Members Present: Physician leading conference: Dr. Alysia Penna Care Coodinator Present: Dorien Chihuahua, RN, BSN, CRRN;Christina Sampson Goon, [place] Nurse Present: Suella Grove, RN PT Present: Barrie Folk, PT OT Present: Other (comment) Providence Lanius, OT) SLP Present: Jettie Booze, CF-SLP PPS Coordinator present : Gunnar Fusi, SLP     Current Status/Progress Goal Weekly Team Focus  Bowel/Bladder   Pt continent x2.  Pt will remain continent x2.  Assess q shift and prn.   Swallow/Nutrition/ Hydration             ADL's   CGA to min: toileting + toilet transfer; min: UB dressing/bathing, min A: LB dressing/bathing  supervision  selfcare retraining, balance retraining, activity tolerance, pt/fam edu, DME/AE edu, functional transfer retraining, functional cognition   Mobility             Communication   word finding deficits and dysfluencies, Min A  Supervision  word finding strategies, education   Safety/Cognition/ Behavioral Observations  ~Min A  Supervision-Min  recall functional info with strategies/aids, basic familiar problem solving, following directions   Pain   Pt does not complain of pain at this time.  Pain will remain <3.  Assess q shift and prn.   Skin   Pt has skin tear on right arm.  Pt will have proper healing without infection.   Assess q shift and prn.     Discharge Planning:  D/C home with SUP from 4 children. Has: RW and Cane. Lives in [place] with elevator   Team Discussion: Mild right hemiparesis post stroke.Episode of global aphasia and weakness on right side on 06/21/20. CT angiogram and EEG completed. Patient is back to baseline today.Continent/incontinent of bowel and bladder. Patient on target to meet rehab goals: yes, currently CGA for toileting and close supervision for upper body ADLs. Progress impaired by poor safety awareness and increased activity intolerance.Goals set for supervision overall.  *See Care Plan and progress notes for long and short-term goals.   Revisions to Treatment Plan:  Address word finding deficits, problem solving and memory issues with cues for safety and prompts for use of equipment Teaching Needs: Transfers, toileting, medications, etc.  Current Barriers to Discharge: Decreased caregiver support and Incontinence  Possible Resolutions to Barriers: Family education     Medical Summary Current Status: Anemia stable, episode of aphasia and increased right-sided weakness yesterday, neuro work-up unremarkable     Possible Resolutions to [employer] Focus: Neurology to follow-up on work-up, determine whether additional time is needed to make up for lost day yesterday   Continued Need for Acute Rehabilitation Level of Care: The patient requires daily medical management by a physician with specialized training in physical medicine and rehabilitation for the following reasons: Direction of a multidisciplinary physical rehabilitation program to maximize functional independence : Yes Medical management of patient stability for  increased activity during participation in an intensive rehabilitation regime.: Yes Analysis of laboratory values and/or radiology reports with any subsequent need for medication adjustment and/or medical intervention. : Yes   I attest that I was  present, lead the team conference, and concur with the assessment and plan of the team.   Dorien Chihuahua B 06/22/2020, 2:44 PM

## 2020-06-22 NOTE — Progress Notes (Signed)
Patient ID: Taylor Castaneda, female   DOB: December 25, 1932, 87 y.o.   MRN: [id number] Team Conference Report to Patient/Family  Team Conference discussion was reviewed with the patient and caregiver, including goals, any changes in plan of care and target discharge date.  Patient and caregiver express understanding and are in agreement.  The patient has a target discharge date of 06/24/20.  [name] 06/22/2020, 2:12 PM

## 2020-06-22 NOTE — Progress Notes (Addendum)
[place] PHYSICAL MEDICINE & REHABILITATION PROGRESS NOTE   Subjective/Complaints: Pt feels "back to normal today" no further episode of speech abnormalities Creat up vs prior , intake is poor , episode of global aphasia yesterday, repeat CTA shows the moderate Left M2 occlusion but no new findings vs prior imaging  ? Watershed infarcts R frontal - would not account for aphasic sx  ROS: Patient denies CP, +SOB when getting OOB, no N/V/D   Objective:   CT Code Stroke CTA Head W/WO contrast  Result Date: 06/21/2020 CLINICAL DATA:  Focal neurological deficit, stroke suspected. Aphasia. EXAM: CT ANGIOGRAPHY HEAD AND NECK CT PERFUSION BRAIN TECHNIQUE: Multidetector CT imaging of the head and neck was performed using the standard protocol during bolus administration of intravenous contrast. Multiplanar CT image reconstructions and MIPs were obtained to evaluate the vascular anatomy. Carotid stenosis measurements (when applicable) are obtained utilizing NASCET criteria, using the distal internal carotid diameter as the denominator. Multiphase CT imaging of the brain was performed following IV bolus contrast injection. Subsequent parametric perfusion maps were calculated using RAPID software. CONTRAST:  149mL OMNIPAQUE IOHEXOL 350 MG/ML SOLN COMPARISON:  None. Head CT June 21, 2020. FINDINGS: CTA NECK FINDINGS Aortic arch: Standard branching. Imaged portion shows no evidence of aneurysm or dissection. Calcified plaques are noted in the aortic arch. No significant stenosis of the major arch vessel origins. Right carotid system: Mild atherosclerotic changes in the right carotid bifurcation. No evidence of dissection, stenosis (50% or greater) or occlusion. Left carotid system: Mild atherosclerotic changes in the left carotid bifurcation. No evidence of dissection, stenosis (50% or greater) or occlusion. Vertebral arteries: Right dominant. No evidence of dissection, stenosis (50% or greater) or occlusion.  Skeleton: Degenerative changes of the cervical spine. No aggressive bone lesion identified. Other neck: Negative. Upper chest: Bilateral pleural effusion. Prominent (11 mm) paratracheal lymph node (series 3, image 157). Review of the MIP images confirms the above findings CTA HEAD FINDINGS Anterior circulation: Calcified plaques in the bilateral carotid siphons without hemodynamically significant stenosis. Mild stenosis of the supraclinoid segment of the left ICA. Early bifurcation of the bilateral M1/MCA segments noted with moderate stenosis at the origin of the left M2/MCA posterior division branch (series 9, image 19). No proximal occlusion, aneurysm, or vascular malformation. Posterior circulation: Patent V4 segment of the bilateral vertebral arteries. The basilar artery has small caliber throughout its course which may be related to the presence of bilateral fetal PCAs. The bilateral posterior cerebral arteries which originate directly from the corresponding ICAs, show mild luminal irregularity likely related to intracranial atherosclerotic disease without hemodynamically significant stenosis. Venous sinuses: As permitted by contrast timing, patent. Anatomic variants: Bilateral fetal PCAs. Review of the MIP images confirms the above findings CT Brain Perfusion Findings: ASPECTS: 10 CBF (<30%) Volume: 49mL Perfusion (Tmax>6.0s) volume: 76mL Mismatch Volume: 59mL Infarction Location:Not applicable. IMPRESSION: 1. No large vessel occlusion. No core infarct or penumbra. 2. Mild atherosclerotic changes in the bilateral carotid bifurcations. No hemodynamically significant stenosis in the neck. 3. Moderate stenosis at the origin of the left M2/MCA posterior division branch. 4. Small caliber basilar artery which may be related to the presence of bilateral fetal PCAs. 5. Bilateral pleural effusion. 6. Prominent paratracheal lymph node, nonspecific. These results were called by telephone at the time of interpretation on  06/21/2020 at 10:59 am to provider MCNEILL [facility] , who verbally acknowledged these results. Aortic Atherosclerosis (ICD10-I70.0). Electronically Signed   By: Pedro Earls M.D.   On: 06/21/2020 10:59  CT Code Stroke CTA Neck W/WO contrast  Result Date: 06/21/2020 CLINICAL DATA:  Focal neurological deficit, stroke suspected. Aphasia. EXAM: CT ANGIOGRAPHY HEAD AND NECK CT PERFUSION BRAIN TECHNIQUE: Multidetector CT imaging of the head and neck was performed using the standard protocol during bolus administration of intravenous contrast. Multiplanar CT image reconstructions and MIPs were obtained to evaluate the vascular anatomy. Carotid stenosis measurements (when applicable) are obtained utilizing NASCET criteria, using the distal internal carotid diameter as the denominator. Multiphase CT imaging of the brain was performed following IV bolus contrast injection. Subsequent parametric perfusion maps were calculated using RAPID software. CONTRAST:  156mL OMNIPAQUE IOHEXOL 350 MG/ML SOLN COMPARISON:  None. Head CT June 21, 2020. FINDINGS: CTA NECK FINDINGS Aortic arch: Standard branching. Imaged portion shows no evidence of aneurysm or dissection. Calcified plaques are noted in the aortic arch. No significant stenosis of the major arch vessel origins. Right carotid system: Mild atherosclerotic changes in the right carotid bifurcation. No evidence of dissection, stenosis (50% or greater) or occlusion. Left carotid system: Mild atherosclerotic changes in the left carotid bifurcation. No evidence of dissection, stenosis (50% or greater) or occlusion. Vertebral arteries: Right dominant. No evidence of dissection, stenosis (50% or greater) or occlusion. Skeleton: Degenerative changes of the cervical spine. No aggressive bone lesion identified. Other neck: Negative. Upper chest: Bilateral pleural effusion. Prominent (11 mm) paratracheal lymph node (series 3, image 157). Review of the MIP images  confirms the above findings CTA HEAD FINDINGS Anterior circulation: Calcified plaques in the bilateral carotid siphons without hemodynamically significant stenosis. Mild stenosis of the supraclinoid segment of the left ICA. Early bifurcation of the bilateral M1/MCA segments noted with moderate stenosis at the origin of the left M2/MCA posterior division branch (series 9, image 19). No proximal occlusion, aneurysm, or vascular malformation. Posterior circulation: Patent V4 segment of the bilateral vertebral arteries. The basilar artery has small caliber throughout its course which may be related to the presence of bilateral fetal PCAs. The bilateral posterior cerebral arteries which originate directly from the corresponding ICAs, show mild luminal irregularity likely related to intracranial atherosclerotic disease without hemodynamically significant stenosis. Venous sinuses: As permitted by contrast timing, patent. Anatomic variants: Bilateral fetal PCAs. Review of the MIP images confirms the above findings CT Brain Perfusion Findings: ASPECTS: 10 CBF (<30%) Volume: 62mL Perfusion (Tmax>6.0s) volume: 18mL Mismatch Volume: 43mL Infarction Location:Not applicable. IMPRESSION: 1. No large vessel occlusion. No core infarct or penumbra. 2. Mild atherosclerotic changes in the bilateral carotid bifurcations. No hemodynamically significant stenosis in the neck. 3. Moderate stenosis at the origin of the left M2/MCA posterior division branch. 4. Small caliber basilar artery which may be related to the presence of bilateral fetal PCAs. 5. Bilateral pleural effusion. 6. Prominent paratracheal lymph node, nonspecific. These results were called by telephone at the time of interpretation on 06/21/2020 at 10:59 am to provider MCNEILL [facility] , who verbally acknowledged these results. Aortic Atherosclerosis (ICD10-I70.0). Electronically Signed   By: Pedro Earls M.D.   On: 06/21/2020 10:59   MR BRAIN WO  CONTRAST  Result Date: 06/21/2020 CLINICAL DATA:  Neuro deficit, acute, stroke suspected. EXAM: MRI HEAD WITHOUT CONTRAST TECHNIQUE: Multiplanar, multiecho pulse sequences of the brain and surrounding structures were obtained without intravenous contrast. COMPARISON:  Head CT June 21, 2020 FINDINGS: Brain: Several foci of restricted diffusion are seen within the deep white matter of the right frontal parietal region, suggestive watershed infarcts. No hemorrhage or significant mass effect. No hydrocephalus, extra-axial collection or mass lesion.  Scattered and confluent foci of T2 hyperintensity are seen within the white of the cerebral hemispheres, nonspecific, most likely related to chronic small vessel ischemia. Moderate parenchymal volume loss. Vascular: Normal flow voids. Skull and upper cervical spine: Normal marrow signal. Sinuses/Orbits: Bilateral lens surgery. Mild scattered mucosal thickening throughout the paranasal sinuses. IMPRESSION: 1. Several foci of restricted diffusion within the deep white matter of the right frontal parietal region, suggestive of watershed infarcts. 2. Moderate parenchymal volume loss and probable chronic small vessel ischemia. These results were called via AMION paging system at the time of interpretation on 06/21/2020 at 5:09 pm to provider MCNEILL [facility]. Electronically Signed   By: Pedro Earls M.D.   On: 06/21/2020 17:10   CT Code Stroke Cerebral Perfusion with contrast  Result Date: 06/21/2020 CLINICAL DATA:  Focal neurological deficit, stroke suspected. Aphasia. EXAM: CT ANGIOGRAPHY HEAD AND NECK CT PERFUSION BRAIN TECHNIQUE: Multidetector CT imaging of the head and neck was performed using the standard protocol during bolus administration of intravenous contrast. Multiplanar CT image reconstructions and MIPs were obtained to evaluate the vascular anatomy. Carotid stenosis measurements (when applicable) are obtained utilizing NASCET criteria,  using the distal internal carotid diameter as the denominator. Multiphase CT imaging of the brain was performed following IV bolus contrast injection. Subsequent parametric perfusion maps were calculated using RAPID software. CONTRAST:  130mL OMNIPAQUE IOHEXOL 350 MG/ML SOLN COMPARISON:  None. Head CT June 21, 2020. FINDINGS: CTA NECK FINDINGS Aortic arch: Standard branching. Imaged portion shows no evidence of aneurysm or dissection. Calcified plaques are noted in the aortic arch. No significant stenosis of the major arch vessel origins. Right carotid system: Mild atherosclerotic changes in the right carotid bifurcation. No evidence of dissection, stenosis (50% or greater) or occlusion. Left carotid system: Mild atherosclerotic changes in the left carotid bifurcation. No evidence of dissection, stenosis (50% or greater) or occlusion. Vertebral arteries: Right dominant. No evidence of dissection, stenosis (50% or greater) or occlusion. Skeleton: Degenerative changes of the cervical spine. No aggressive bone lesion identified. Other neck: Negative. Upper chest: Bilateral pleural effusion. Prominent (11 mm) paratracheal lymph node (series 3, image 157). Review of the MIP images confirms the above findings CTA HEAD FINDINGS Anterior circulation: Calcified plaques in the bilateral carotid siphons without hemodynamically significant stenosis. Mild stenosis of the supraclinoid segment of the left ICA. Early bifurcation of the bilateral M1/MCA segments noted with moderate stenosis at the origin of the left M2/MCA posterior division branch (series 9, image 19). No proximal occlusion, aneurysm, or vascular malformation. Posterior circulation: Patent V4 segment of the bilateral vertebral arteries. The basilar artery has small caliber throughout its course which may be related to the presence of bilateral fetal PCAs. The bilateral posterior cerebral arteries which originate directly from the corresponding ICAs, show mild  luminal irregularity likely related to intracranial atherosclerotic disease without hemodynamically significant stenosis. Venous sinuses: As permitted by contrast timing, patent. Anatomic variants: Bilateral fetal PCAs. Review of the MIP images confirms the above findings CT Brain Perfusion Findings: ASPECTS: 10 CBF (<30%) Volume: 85mL Perfusion (Tmax>6.0s) volume: 11mL Mismatch Volume: 48mL Infarction Location:Not applicable. IMPRESSION: 1. No large vessel occlusion. No core infarct or penumbra. 2. Mild atherosclerotic changes in the bilateral carotid bifurcations. No hemodynamically significant stenosis in the neck. 3. Moderate stenosis at the origin of the left M2/MCA posterior division branch. 4. Small caliber basilar artery which may be related to the presence of bilateral fetal PCAs. 5. Bilateral pleural effusion. 6. Prominent paratracheal lymph node, nonspecific. These  results were called by telephone at the time of interpretation on 06/21/2020 at 10:59 am to provider MCNEILL [facility] , who verbally acknowledged these results. Aortic Atherosclerosis (ICD10-I70.0). Electronically Signed   By: Pedro Earls M.D.   On: 06/21/2020 10:59   EEG adult  Result Date: 06/21/2020 Lora Havens, MD     06/21/2020  4:08 PM Patient Name: Taylor Castaneda MRN: [id number] Epilepsy Attending: Lora Havens Referring Physician/Provider: Clance Boll, NP Date: 06/21/2020 Duration: 24.37 mins Patient history: 87 y.o. female with a history of multiple recurrent spells of uncertain etiology.  EEG to evaluate for seizure Level of alertness: Awake, asleep AEDs during EEG study: LEV Technical aspects: This EEG study was done with scalp electrodes positioned according to the 10-20 International system of electrode placement. Electrical activity was acquired at a sampling rate of 500Hz  and reviewed with a high frequency filter of 70Hz  and a low frequency filter of 1Hz . EEG data were recorded continuously and  digitally stored. Description: The posterior dominant rhythm consists of 7.5-8 Hz activity of moderate voltage (25-35 uV) seen predominantly in posterior head regions, symmetric and reactive to eye opening and eye closing. Sleep was characterized by vertex waves, sleep spindles (12 to 14 Hz), maximal frontocentral region. EEG showed intermittent generalized 3 to 6 Hz theta-delta slowing. Physiologic photic driving was not seen during photic stimulation. Hyperventilation was not performed.   ABNORMALITY -Intermittent slow, generalized IMPRESSION: This study is suggestive of mild diffuse encephalopathy, nonspecific etiology. No seizures or epileptiform discharges were seen throughout the recording. Lora Havens   CT HEAD CODE STROKE WO CONTRAST  Result Date: 06/21/2020 CLINICAL DATA:  Code stroke.  Acute neuro deficit.  Aphasia EXAM: CT HEAD WITHOUT CONTRAST TECHNIQUE: Contiguous axial images were obtained from the base of the skull through the vertex without intravenous contrast. COMPARISON:  CT head 05/29/2020 FINDINGS: Brain: Moderate atrophy. Patchy white matter hypodensity bilaterally stable. Chronic infarct chronic lacunar infarction in the right internal capsule unchanged. Negative for acute infarct, hemorrhage, mass. Vascular: Negative for hyperdense vessel. Skull: Negative Sinuses/Orbits: Paranasal sinuses clear. Bilateral cataract extraction. Other: None ASPECTS ([place] Stroke Program Early CT Score) - Ganglionic level infarction (caudate, lentiform nuclei, internal capsule, insula, M1-M3 cortex): 7 - Supraganglionic infarction (M4-M6 cortex): 3 Total score (0-10 with 10 being normal): 10 IMPRESSION: 1. No acute abnormality and no change from the recent study 2. Atrophy and chronic microvascular ischemia, stable 3. ASPECTS is 10 4. Code stroke imaging results were communicated on 06/21/2020 at 10:09 am to provider Leonel Ramsay via text page Electronically Signed   By: Franchot Gallo M.D.   On:  06/21/2020 10:10   No results for input(s): WBC, HGB, HCT, PLT in the last 72 hours. Recent Labs    06/20/20 0518 06/22/20 0711  NA 140 137  K 3.7 3.3*  CL 101 98  CO2 27 29  GLUCOSE 39* 104*  BUN 22 28*  CREATININE 1.41* 1.68*  CALCIUM 8.3* 8.1*    Intake/Output Summary (Last 24 hours) at 06/22/2020 0840 Last data filed at 06/21/2020 1808 Gross per 24 hour  Intake 120 ml  Output --  Net 120 ml     Pressure Injury 06/10/20 Coccyx Medial Stage 1 -  Intact skin with non-blanchable redness of a localized area usually over a bony prominence. (Active)  06/10/20 2200  Location: Coccyx  Location Orientation: Medial  Staging: Stage 1 -  Intact skin with non-blanchable redness of a localized area usually over a bony prominence.  Wound Description (Comments):   Present on Admission: Yes    Physical Exam: Vital Signs Blood pressure (!) 119/46, pulse 66, temperature 97.9 F (36.6 C), resp. rate 18, height 5' (1.524 m), weight 56.9 kg, SpO2 98 %.  General: No acute distress Mood and affect are appropriate Heart: Regular rate and rhythm no rubs murmurs or extra sounds Lungs: Clear to auscultation, breathing unlabored, no rales or wheezes Abdomen: Positive bowel sounds, soft nontender to palpation, nondistended Extremities: No clubbing, cyanosis, or edema Skin: No evidence of breakdown, no evidence of rash    Skin: right wrist wound with vaseline gauze and kerlix--drainage much improved Neuro: oriented x3, language more fluent by the day. Reasonable insight and awareness. RUE 4+/5 RLE 4+/5. LUE and LLE 4/5. No focal sensory deficits appreciated Musculoskeletal: no tenderness over sacrum       Assessment/Plan: 1. Functional deficits which require 3+ hours per day of interdisciplinary therapy in a comprehensive inpatient rehab setting.  Physiatrist is providing close team supervision and 24 hour management of active medical problems listed below.  Physiatrist and rehab team  continue to assess barriers to discharge/monitor patient progress toward functional and medical goals  Care Tool:  Bathing    Body parts bathed by patient: Right arm,Left arm,Chest,Abdomen (UB only today)   Body parts bathed by helper: Buttocks,Right lower leg,Left lower leg     Bathing assist Assist Level: Supervision/Verbal cueing     Upper Body Dressing/Undressing Upper body dressing Upper body dressing/undressing activity did not occur (including orthotics): Safety/medical concerns What is the patient wearing?: Pull over shirt    Upper body assist Assist Level: Minimal Assistance - Patient > 75%    Lower Body Dressing/Undressing Lower body dressing      What is the patient wearing?: Pants     Lower body assist Assist for lower body dressing: Minimal Assistance - Patient > 75%     Toileting Toileting    Toileting assist Assist for toileting: Moderate Assistance - Patient 50 - 74%     Transfers Chair/bed transfer  Transfers assist     Chair/bed transfer assist level: Moderate Assistance - Patient 50 - 74%     Locomotion Ambulation   Ambulation assist      Assist level: Contact Guard/Touching assist Assistive device: Walker-rolling Max distance: 11ft   Walk 10 feet activity   Assist  Walk 10 feet activity did not occur:  (knee pain and s.o.b)  Assist level: Contact Guard/Touching assist Assistive device: Walker-rolling   Walk 50 feet activity   Assist Walk 50 feet with 2 turns activity did not occur: Safety/medical concerns  Assist level: Contact Guard/Touching assist Assistive device: Walker-rolling    Walk 150 feet activity   Assist Walk 150 feet activity did not occur: Safety/medical concerns         Walk 10 feet on uneven surface  activity   Assist Walk 10 feet on uneven surfaces activity did not occur: Safety/medical concerns         Wheelchair     Assist Will patient use wheelchair at discharge?: No              Wheelchair 50 feet with 2 turns activity    Assist            Wheelchair 150 feet activity     Assist          Blood pressure (!) 119/46, pulse 66, temperature 97.9 F (36.6 C), resp. rate 18, height 5' (1.524 m), weight 56.9  kg, SpO2 98 %.  Medical Problem List and Plan: 1.Decreased functional ability with aphasiasecondary to several punctate right centrum semiovale infarct likely due to severe intracranial stenosis -patient may Shower  -ELOS/Goals: 1/21, will check with PT,OT whether there is any decline in functional abilities today  -may resume full therapy   -Continue CIR-  2. Antithrombotics: -DVT/anticoagulation:Subcutaneous heparin switched to lovenox- will hold givne severe anemia  -antiplatelet therapy: Aspirin 325 mg daily and Plavix 75 mg daily x3 months then Plavix alone 3. Pain Management:D/c Tramadol since she denies pain and has AKI. Chronic sacral fx asymptomatic  4. Mood:Provide emotional support -antipsychotic agents: N/A 5. Neuropsych: This patientiscapable of making decisions on herown behalf. 6. Skin/Wound Care:Routine skin checks 7. Fluids/Electrolytes/Nutrition:Routine in and outs with follow-up chemistries BMET Monday , monitor K+ and Cr given AKI 8. Cholelithiasis/choledocholithiasis. Status post ERCP followed by laparoscopic cholecystectomy 06/01/2020 -pt still denies pain 9. Seizure disorder. Keppra 500 mg twice daily. EEG negative 10. Hypertension. Altace 5 mg daily, Coreg 12.5 mg twice daily.  Vitals:   06/22/20 0426 06/22/20 0818  BP: 135/61 (!) 119/46  Pulse: 64 66  Resp: 18   Temp: 97.9 F (36.6 C)   SpO2: 98%   change furosemide to qam, 10mg  11. Diabetes mellitus. Hemoglobin A1c 8.7. Lantus insulin 8 units nightly---pt takes 16u at bedtime at home    CBG (last 3)  Recent Labs    06/21/20 1720 06/21/20 2139 06/22/20 0622  GLUCAP  229* 162* 107*  hypoglycemia, reduce lantus and make sure pt gets hs snack 12. Hyperlipidemia. Lipitor 13. History of colon cancer with resection 1998. Follow-up as outpatient  14.  Leukocytosis no fever monitor for now   1/16: WBC down to 8.8, routine CBC ordered for tomorrow  15.  DOE- likely deconditioning plus anemia, will monitor in therapy , check CXR to look for pulm edema pulm HTN , off lasix 16,  ABLA- appreciate GI note no clear cut signs of GIB- await post transfusion Hgb   GI suggested possible retroperitoneal bleed- no evidence of bleed      1/16 Hgb 8.4, stable- as discussed with pt and son, some of drop may have been   LOS: 12 days A FACE TO FACE EVALUATION WAS PERFORMED  Charlett Blake 06/22/2020, 8:40 AM

## 2020-06-22 NOTE — Progress Notes (Signed)
Physical Therapy Session Note  Patient Details  Name: Taylor Castaneda MRN: [id number] Date of Birth: 1933/04/13  Today's Date: 06/22/2020 PT Individual Time: 1450-1530 PT Individual Time Calculation (min): 40 min   Short Term Goals: Week 2:  PT Short Term Goal 1 (Week 2): STG=LTG due to ELOS  Skilled Therapeutic Interventions/Progress Updates: Pt presented in w/c coming out of bathroom with NT. Pt denies pain and agreeable to therapy. Pt transported to rehab gym for energy conservation and performed stand pivot to mat with CGA and increased time for standing from w/c with posterior bias noted. Pt participated in STS without AD with PTA blocking pt for static balance and BLE strengthening. PTA provided min facilitation at hips to decrease L lateral lean. Performed toe taps to 2in step x10 with RW and toe taps to target (cones) x 10 bilaterally and x 5 alternating crossing midline. Participated in ambulation x 35f with RW and minA for RW management and multimodal cues for increasing step length with LLE. Pt then transported to room and ambulated from doorway to bed in same manner ~265f Performed sit to supine with supervision and without use of bed features. Pt left in bed at end of session with bed alarm on, call bell within reach and current needs met with son and nsg present.      Therapy Documentation Precautions:  Precautions Precautions: Fall,Other (comment) Precaution Comments: monitor vitals with activity, hx of seizures Restrictions Weight Bearing Restrictions: No General:   Vital Signs: Therapy Vitals Temp: 98.4 F (36.9 C) Pulse Rate: 74 Resp: 17 BP: (!) 123/44 Patient Position (if appropriate): Sitting Oxygen Therapy SpO2: 100 % O2 Device: Room Air Pain: Pain Assessment Pain Scale: 0-10 Pain Score: 0-No pain  Therapy/Group: Individual Therapy  Elasia Furnish  Trissa Molina, PTA  06/22/2020, 4:08 PM

## 2020-06-22 NOTE — Progress Notes (Signed)
Speech Language Pathology Daily Session Note  Patient Details  Name: Taylor Castaneda MRN: [id number] Date of Birth: 09/28/1932  Today's Date: 06/22/2020 SLP Individual Time: 1300-1356 SLP Individual Time Calculation (min): 56 min  Short Term Goals: Week 2: SLP Short Term Goal 1 (Week 2): STG=LTG due to remaining length of stay  Skilled Therapeutic Interventions: Pt was seen for skilled ST targeting cognitive-linguistic skills. Son present at beginning of session. Pt was communicating more fluently today, with only Min A being required to correct dysfluencies and use word finding strategies in conversation and during picture description tasks. Confrontation naming was [facility]. She did require Min A verbal and visual cues to identify and describe logical errors on a basic emergent awareness calendar task. Min-Mod cueing required for problem solving and comprehension of information on a basic weekly weather forecast. Initiation and attention also appear back to her new baseline for admission, therefore, will reduce meal supervision to intermittent. PT left in recliner with alarm set and needs within reach. Continue per current plan of care.          Pain Pain Assessment Pain Scale: 0-10 Pain Score: 0-No pain  Therapy/Group: Individual Therapy  Arbutus Leas 06/22/2020, 7:26 AM

## 2020-06-22 NOTE — Discharge Summary (Signed)
Physician Discharge Summary  Patient ID: Taylor Castaneda MRN: [id number] DOB/AGE: 1932/12/04 87 y.o.  Admit date: 06/10/2020 Discharge date: 06/24/2020  Discharge Diagnoses:  Principal Problem:   Subcortical infarction [facility]) Active Problems:   Pressure injury of skin   Melena DVT prophylaxis Cholelithiasis/choledocholithiasis Hypertension Seizure disorder Diabetes mellitus Hyperlipidemia History of colon cancer with resection Acute/chronic blood loss anemia   Discharged Condition: Stable  Significant Diagnostic Studies: CT ABDOMEN PELVIS WO CONTRAST  Result Date: 06/16/2020 CLINICAL DATA:  Anemia EXAM: CT ABDOMEN AND PELVIS WITHOUT CONTRAST TECHNIQUE: Multidetector CT imaging of the abdomen and pelvis was performed following the standard protocol without IV contrast. COMPARISON:  June 20, 2009 FINDINGS: Lower chest: There are large bilateral pleural effusions with adjacent compressive atelectasis.There is cardiomegaly. The intracardiac blood pool is hypodense relative to the adjacent myocardium consistent with anemia. Hepatobiliary: The liver is normal. Status post cholecystectomy.There is pneumobilia, likely related to prior sphincterotomy. Pancreas: Normal contours without ductal dilatation. No peripancreatic fluid collection. Spleen: Unremarkable. Adrenals/Urinary Tract: --Adrenal glands: Unremarkable. --Right kidney/ureter: No hydronephrosis or radiopaque kidney stones. --Left kidney/ureter: There is a large nonobstructing stone in upper pole measuring 1.9 cm. --Urinary bladder: Unremarkable. Stomach/Bowel: --Stomach/Duodenum: No hiatal hernia or other gastric abnormality. Normal duodenal course and caliber. --Small bowel: Unremarkable. --Colon: Unremarkable. --Appendix: Surgically absent. Vascular/Lymphatic: Atherosclerotic calcification is present within the non-aneurysmal abdominal aorta, without hemodynamically significant stenosis. --No retroperitoneal lymphadenopathy. --No  mesenteric lymphadenopathy. --No pelvic or inguinal lymphadenopathy. Reproductive: Unremarkable Other: No ascites or free air. The abdominal wall is normal. Musculoskeletal. There is a subacute appearing fracture through the is lower sacrum. IMPRESSION: 1. No acute abdominopelvic abnormality. 2. Subacute appearing fracture through the lower sacrum. 3. Large bilateral pleural effusions with adjacent compressive atelectasis. 4. Cardiomegaly. Aortic Atherosclerosis (ICD10-I70.0). Electronically Signed   By: Constance Holster M.D.   On: 06/16/2020 02:35   CT Code Stroke CTA Head W/WO contrast  Result Date: 06/21/2020 CLINICAL DATA:  Focal neurological deficit, stroke suspected. Aphasia. EXAM: CT ANGIOGRAPHY HEAD AND NECK CT PERFUSION BRAIN TECHNIQUE: Multidetector CT imaging of the head and neck was performed using the standard protocol during bolus administration of intravenous contrast. Multiplanar CT image reconstructions and MIPs were obtained to evaluate the vascular anatomy. Carotid stenosis measurements (when applicable) are obtained utilizing NASCET criteria, using the distal internal carotid diameter as the denominator. Multiphase CT imaging of the brain was performed following IV bolus contrast injection. Subsequent parametric perfusion maps were calculated using RAPID software. CONTRAST:  185mL OMNIPAQUE IOHEXOL 350 MG/ML SOLN COMPARISON:  None. Head CT June 21, 2020. FINDINGS: CTA NECK FINDINGS Aortic arch: Standard branching. Imaged portion shows no evidence of aneurysm or dissection. Calcified plaques are noted in the aortic arch. No significant stenosis of the major arch vessel origins. Right carotid system: Mild atherosclerotic changes in the right carotid bifurcation. No evidence of dissection, stenosis (50% or greater) or occlusion. Left carotid system: Mild atherosclerotic changes in the left carotid bifurcation. No evidence of dissection, stenosis (50% or greater) or occlusion. Vertebral  arteries: Right dominant. No evidence of dissection, stenosis (50% or greater) or occlusion. Skeleton: Degenerative changes of the cervical spine. No aggressive bone lesion identified. Other neck: Negative. Upper chest: Bilateral pleural effusion. Prominent (11 mm) paratracheal lymph node (series 3, image 157). Review of the MIP images confirms the above findings CTA HEAD FINDINGS Anterior circulation: Calcified plaques in the bilateral carotid siphons without hemodynamically significant stenosis. Mild stenosis of the supraclinoid segment of the left ICA. Early bifurcation of the bilateral M1/MCA  segments noted with moderate stenosis at the origin of the left M2/MCA posterior division branch (series 9, image 19). No proximal occlusion, aneurysm, or vascular malformation. Posterior circulation: Patent V4 segment of the bilateral vertebral arteries. The basilar artery has small caliber throughout its course which may be related to the presence of bilateral fetal PCAs. The bilateral posterior cerebral arteries which originate directly from the corresponding ICAs, show mild luminal irregularity likely related to intracranial atherosclerotic disease without hemodynamically significant stenosis. Venous sinuses: As permitted by contrast timing, patent. Anatomic variants: Bilateral fetal PCAs. Review of the MIP images confirms the above findings CT Brain Perfusion Findings: ASPECTS: 10 CBF (<30%) Volume: 61mL Perfusion (Tmax>6.0s) volume: 98mL Mismatch Volume: 64mL Infarction Location:Not applicable. IMPRESSION: 1. No large vessel occlusion. No core infarct or penumbra. 2. Mild atherosclerotic changes in the bilateral carotid bifurcations. No hemodynamically significant stenosis in the neck. 3. Moderate stenosis at the origin of the left M2/MCA posterior division branch. 4. Small caliber basilar artery which may be related to the presence of bilateral fetal PCAs. 5. Bilateral pleural effusion. 6. Prominent paratracheal lymph  node, nonspecific. These results were called by telephone at the time of interpretation on 06/21/2020 at 10:59 am to provider [facility] , who verbally acknowledged these results. Aortic Atherosclerosis (ICD10-I70.0). Electronically Signed   By: Pedro Earls M.D.   On: 06/21/2020 10:59   DG Chest 2 View  Result Date: 06/13/2020 CLINICAL DATA:  Dyspnea on exertion. EXAM: CHEST - 2 VIEW COMPARISON:  11/20/2016 FINDINGS: The heart size appears normal. Small bilateral pleural effusions with mild interstitial edema noted. No airspace consolidation. Visualized osseous structures are unremarkable. IMPRESSION: Mild CHF. Electronically Signed   By: Kerby Moors M.D.   On: 06/13/2020 09:58   DG Knee 1-2 Views Right  Result Date: 06/13/2020 CLINICAL DATA:  Shortness of breath on exertion. Right anterior knee pain. No reported injury. EXAM: RIGHT KNEE - 1-2 VIEW COMPARISON:  None. FINDINGS: Possible trace joint effusion. No acute osseous abnormality. Trace patellofemoral osteophytosis. Prepatellar soft tissue swelling. IMPRESSION: 1. Prepatellar soft tissue swelling. Possible trace joint effusion. No fracture. 2. Trace patellofemoral osteophytosis. . Electronically Signed   By: Lorin Picket M.D.   On: 06/13/2020 09:54   CT Head Wo Contrast  Result Date: 05/29/2020 CLINICAL DATA:  Seizures.  Found on ground. EXAM: CT HEAD WITHOUT CONTRAST CT CERVICAL SPINE WITHOUT CONTRAST TECHNIQUE: Multidetector CT imaging of the head and cervical spine was performed following the standard protocol without intravenous contrast. Multiplanar CT image reconstructions of the cervical spine were also generated. COMPARISON:  Head CT 03/05/2019, head and neck CTA 03/05/2019, and head MRI 03/06/2019 FINDINGS: CT HEAD FINDINGS Brain: There is no evidence of an acute infarct, intracranial hemorrhage, mass, midline shift, or extra-axial fluid collection. Hypodensities in the cerebral white matter bilaterally are  unchanged and nonspecific but compatible with mild chronic small vessel ischemic disease. A chronic lacunar infarct is again noted in the right basal ganglia. Moderately advanced cerebral atrophy is unchanged. Vascular: Calcified atherosclerosis at the skull base. No hyperdense vessel. Skull: No fracture or suspicious osseous lesion. Sinuses/Orbits: Visualized paranasal sinuses and mastoid air cells are clear. Bilateral cataract extraction. Other: None. CT CERVICAL SPINE FINDINGS Alignment: Cervical spine straightening. Trace anterolisthesis of C4 on C5. Skull base and vertebrae: No acute fracture or suspicious osseous lesion. Moderate median C1-2 arthropathy. Soft tissues and spinal canal: No prevertebral fluid or swelling. No visible canal hematoma. Disc levels: Severe disc space narrowing at C5-6 and C6-7  with uncovertebral spurring resulting in only mild bilateral neural foraminal stenosis. Mild cervical facet arthrosis, most notable on the left at C4-5. Upper chest: Clear lung apices. Other: Mild carotid atherosclerosis. IMPRESSION: 1. No evidence of acute intracranial abnormality. 2. Mild chronic small vessel ischemic disease and moderately advanced cerebral atrophy. 3. No acute cervical spine fracture. Electronically Signed   By: Logan Bores M.D.   On: 05/29/2020 20:13   CT Code Stroke CTA Neck W/WO contrast  Result Date: 06/21/2020 CLINICAL DATA:  Focal neurological deficit, stroke suspected. Aphasia. EXAM: CT ANGIOGRAPHY HEAD AND NECK CT PERFUSION BRAIN TECHNIQUE: Multidetector CT imaging of the head and neck was performed using the standard protocol during bolus administration of intravenous contrast. Multiplanar CT image reconstructions and MIPs were obtained to evaluate the vascular anatomy. Carotid stenosis measurements (when applicable) are obtained utilizing NASCET criteria, using the distal internal carotid diameter as the denominator. Multiphase CT imaging of the brain was performed following  IV bolus contrast injection. Subsequent parametric perfusion maps were calculated using RAPID software. CONTRAST:  138mL OMNIPAQUE IOHEXOL 350 MG/ML SOLN COMPARISON:  None. Head CT June 21, 2020. FINDINGS: CTA NECK FINDINGS Aortic arch: Standard branching. Imaged portion shows no evidence of aneurysm or dissection. Calcified plaques are noted in the aortic arch. No significant stenosis of the major arch vessel origins. Right carotid system: Mild atherosclerotic changes in the right carotid bifurcation. No evidence of dissection, stenosis (50% or greater) or occlusion. Left carotid system: Mild atherosclerotic changes in the left carotid bifurcation. No evidence of dissection, stenosis (50% or greater) or occlusion. Vertebral arteries: Right dominant. No evidence of dissection, stenosis (50% or greater) or occlusion. Skeleton: Degenerative changes of the cervical spine. No aggressive bone lesion identified. Other neck: Negative. Upper chest: Bilateral pleural effusion. Prominent (11 mm) paratracheal lymph node (series 3, image 157). Review of the MIP images confirms the above findings CTA HEAD FINDINGS Anterior circulation: Calcified plaques in the bilateral carotid siphons without hemodynamically significant stenosis. Mild stenosis of the supraclinoid segment of the left ICA. Early bifurcation of the bilateral M1/MCA segments noted with moderate stenosis at the origin of the left M2/MCA posterior division branch (series 9, image 19). No proximal occlusion, aneurysm, or vascular malformation. Posterior circulation: Patent V4 segment of the bilateral vertebral arteries. The basilar artery has small caliber throughout its course which may be related to the presence of bilateral fetal PCAs. The bilateral posterior cerebral arteries which originate directly from the corresponding ICAs, show mild luminal irregularity likely related to intracranial atherosclerotic disease without hemodynamically significant stenosis.  Venous sinuses: As permitted by contrast timing, patent. Anatomic variants: Bilateral fetal PCAs. Review of the MIP images confirms the above findings CT Brain Perfusion Findings: ASPECTS: 10 CBF (<30%) Volume: 41mL Perfusion (Tmax>6.0s) volume: 85mL Mismatch Volume: 26mL Infarction Location:Not applicable. IMPRESSION: 1. No large vessel occlusion. No core infarct or penumbra. 2. Mild atherosclerotic changes in the bilateral carotid bifurcations. No hemodynamically significant stenosis in the neck. 3. Moderate stenosis at the origin of the left M2/MCA posterior division branch. 4. Small caliber basilar artery which may be related to the presence of bilateral fetal PCAs. 5. Bilateral pleural effusion. 6. Prominent paratracheal lymph node, nonspecific. These results were called by telephone at the time of interpretation on 06/21/2020 at 10:59 am to provider [facility] , who verbally acknowledged these results. Aortic Atherosclerosis (ICD10-I70.0). Electronically Signed   By: Pedro Earls M.D.   On: 06/21/2020 10:59   CT Cervical Spine Wo Contrast  Result  Date: 05/29/2020 CLINICAL DATA:  Seizures.  Found on ground. EXAM: CT HEAD WITHOUT CONTRAST CT CERVICAL SPINE WITHOUT CONTRAST TECHNIQUE: Multidetector CT imaging of the head and cervical spine was performed following the standard protocol without intravenous contrast. Multiplanar CT image reconstructions of the cervical spine were also generated. COMPARISON:  Head CT 03/05/2019, head and neck CTA 03/05/2019, and head MRI 03/06/2019 FINDINGS: CT HEAD FINDINGS Brain: There is no evidence of an acute infarct, intracranial hemorrhage, mass, midline shift, or extra-axial fluid collection. Hypodensities in the cerebral white matter bilaterally are unchanged and nonspecific but compatible with mild chronic small vessel ischemic disease. A chronic lacunar infarct is again noted in the right basal ganglia. Moderately advanced cerebral atrophy is  unchanged. Vascular: Calcified atherosclerosis at the skull base. No hyperdense vessel. Skull: No fracture or suspicious osseous lesion. Sinuses/Orbits: Visualized paranasal sinuses and mastoid air cells are clear. Bilateral cataract extraction. Other: None. CT CERVICAL SPINE FINDINGS Alignment: Cervical spine straightening. Trace anterolisthesis of C4 on C5. Skull base and vertebrae: No acute fracture or suspicious osseous lesion. Moderate median C1-2 arthropathy. Soft tissues and spinal canal: No prevertebral fluid or swelling. No visible canal hematoma. Disc levels: Severe disc space narrowing at C5-6 and C6-7 with uncovertebral spurring resulting in only mild bilateral neural foraminal stenosis. Mild cervical facet arthrosis, most notable on the left at C4-5. Upper chest: Clear lung apices. Other: Mild carotid atherosclerosis. IMPRESSION: 1. No evidence of acute intracranial abnormality. 2. Mild chronic small vessel ischemic disease and moderately advanced cerebral atrophy. 3. No acute cervical spine fracture. Electronically Signed   By: Logan Bores M.D.   On: 05/29/2020 20:13   CT Thoracic Spine Wo Contrast  Result Date: 05/29/2020 CLINICAL DATA:  Seizures.  Found on ground. EXAM: CT THORACIC AND LUMBAR SPINE WITHOUT CONTRAST TECHNIQUE: Multidetector CT imaging of the thoracic and lumbar spine was performed without contrast. Multiplanar CT image reconstructions were also generated. COMPARISON:  CT abdomen and pelvis 06/13/2009 FINDINGS: CT THORACIC SPINE FINDINGS Alignment: Mild thoracic dextroscoliosis.  No listhesis. Vertebrae: No acute fracture or suspicious osseous lesion. Paraspinal and other soft tissues: Aortic and coronary atherosclerosis. Disc levels: Mild-to-moderate multilevel disc degeneration and moderate facet arthrosis in the thoracic spine. No evidence of high-grade spinal stenosis. Moderate left neural foraminal stenosis at T11-12 due to prominent facet spurring. CT LUMBAR SPINE FINDINGS  Segmentation: 5 lumbar type vertebrae. Alignment: Mild lumbar levoscoliosis.  No listhesis. Vertebrae: No acute fracture or suspicious osseous lesion. Paraspinal and other soft tissues: Nonobstructing 1.5 cm left renal stone, much larger than in 2011. Partially visualized left renal cyst measuring at least 3.8 cm. Abdominal aortic atherosclerosis without aneurysm. Disc levels: Mild disc space narrowing at L2-3 with severe narrowing at L4-5. Disc bulging and posterior element hypertrophy result in severe spinal stenosis at L4-5 and mild spinal stenosis at L3-4 greater than L2-3. There is moderate multilevel neural foraminal stenosis. IMPRESSION: 1. No acute osseous abnormality identified in the thoracic or lumbar spine. 2. Thoracic and lumbar disc and facet degeneration as above. Severe spinal stenosis at L4-5. 3. Large nonobstructing left renal stone. 4. Aortic Atherosclerosis (ICD10-I70.0). Electronically Signed   By: Logan Bores M.D.   On: 05/29/2020 20:53   CT Lumbar Spine Wo Contrast  Result Date: 05/29/2020 CLINICAL DATA:  Seizures.  Found on ground. EXAM: CT THORACIC AND LUMBAR SPINE WITHOUT CONTRAST TECHNIQUE: Multidetector CT imaging of the thoracic and lumbar spine was performed without contrast. Multiplanar CT image reconstructions were also generated. COMPARISON:  CT abdomen and  pelvis 06/13/2009 FINDINGS: CT THORACIC SPINE FINDINGS Alignment: Mild thoracic dextroscoliosis.  No listhesis. Vertebrae: No acute fracture or suspicious osseous lesion. Paraspinal and other soft tissues: Aortic and coronary atherosclerosis. Disc levels: Mild-to-moderate multilevel disc degeneration and moderate facet arthrosis in the thoracic spine. No evidence of high-grade spinal stenosis. Moderate left neural foraminal stenosis at T11-12 due to prominent facet spurring. CT LUMBAR SPINE FINDINGS Segmentation: 5 lumbar type vertebrae. Alignment: Mild lumbar levoscoliosis.  No listhesis. Vertebrae: No acute fracture or  suspicious osseous lesion. Paraspinal and other soft tissues: Nonobstructing 1.5 cm left renal stone, much larger than in 2011. Partially visualized left renal cyst measuring at least 3.8 cm. Abdominal aortic atherosclerosis without aneurysm. Disc levels: Mild disc space narrowing at L2-3 with severe narrowing at L4-5. Disc bulging and posterior element hypertrophy result in severe spinal stenosis at L4-5 and mild spinal stenosis at L3-4 greater than L2-3. There is moderate multilevel neural foraminal stenosis. IMPRESSION: 1. No acute osseous abnormality identified in the thoracic or lumbar spine. 2. Thoracic and lumbar disc and facet degeneration as above. Severe spinal stenosis at L4-5. 3. Large nonobstructing left renal stone. 4. Aortic Atherosclerosis (ICD10-I70.0). Electronically Signed   By: Logan Bores M.D.   On: 05/29/2020 20:53   MR ANGIO HEAD WO CONTRAST  Result Date: 06/03/2020 CLINICAL DATA:  87 year old female with transient aphasia and punctate foci of acute ischemia in the right hemisphere white matter on MRI this morning. EXAM: MRA HEAD WITHOUT CONTRAST TECHNIQUE: Angiographic images of the Circle of Willis were obtained using MRA technique without intravenous contrast. COMPARISON:  Brain MRI 0414 hours today. CTA head and neck 03/05/2019. FINDINGS: And no intracranial mass effect or ventriculomegaly. Antegrade flow in the posterior circulation with codominant patent distal vertebral arteries. No distal vertebral or vertebrobasilar junction stenosis. Patent right PICA origin, left AICA which may be dominant. Patent basilar artery although diminutive distally with mild to moderate mid basilar stenosis on series 1055, image 10. The basilar artery functionally terminates in SCAs. Fetal type bilateral PCA origins. Both SCA is are patent. Bilateral PCAs are patent, with mild irregularity on the left and mild to moderate right PCA P2 irregularity and stenosis. Antegrade flow in both ICA siphons. On  the right there is mild siphon irregularity and stenosis. But there is severe left siphon supraclinoid stenosis as demonstrated on series 1045, image 16. Posterior communicating artery origins are normal. Patent bilateral carotid termini. Patent MCA and ACA origins. Diminutive or absent anterior communicating artery. Bilateral visible ACA branches are within normal limits. Left MCA M1 segment bifurcates early without stenosis but there is severe stenosis at the origin of a superior left MCA M2 branch on series 1045 image 13. Other left MCA branches are within normal limits. Right MCA M1 is patent but there is severe stenosis at the right MCA bifurcation also affecting the proximal superior M2 branch, as well as moderate stenosis at the origin of the inferior M2 branch (series 1045, image 10). No complete occlusion of a right MCA branch identified. IMPRESSION: 1. Advanced intracranial atherosclerosis. No large vessel occlusion identified, but: - Severe stenosis at the Right MCA bifurcation, with long segment involvement of the superior M2. - Severe stenosis distal Left ICA siphon and Left MCA superior M2 branch origin. 2. Moderate stenosis Right PCA P2 segment. Electronically Signed   By: Genevie Ann M.D.   On: 06/03/2020 11:33   MR BRAIN WO CONTRAST  Result Date: 06/21/2020 CLINICAL DATA:  Neuro deficit, acute, stroke suspected. EXAM: MRI  HEAD WITHOUT CONTRAST TECHNIQUE: Multiplanar, multiecho pulse sequences of the brain and surrounding structures were obtained without intravenous contrast. COMPARISON:  Head CT June 21, 2020 FINDINGS: Brain: Several foci of restricted diffusion are seen within the deep white matter of the right frontal parietal region, suggestive watershed infarcts. No hemorrhage or significant mass effect. No hydrocephalus, extra-axial collection or mass lesion. Scattered and confluent foci of T2 hyperintensity are seen within the white of the cerebral hemispheres, nonspecific, most likely  related to chronic small vessel ischemia. Moderate parenchymal volume loss. Vascular: Normal flow voids. Skull and upper cervical spine: Normal marrow signal. Sinuses/Orbits: Bilateral lens surgery. Mild scattered mucosal thickening throughout the paranasal sinuses. IMPRESSION: 1. Several foci of restricted diffusion within the deep white matter of the right frontal parietal region, suggestive of watershed infarcts. 2. Moderate parenchymal volume loss and probable chronic small vessel ischemia. These results were called via [employer] paging system at the time of interpretation on 06/21/2020 at 5:09 pm to provider [facility]NE[facility]. Electronically Signed   By: Pedro Earls M.D.   On: 06/21/2020 17:10   MR BRAIN WO CONTRAST  Result Date: 06/03/2020 CLINICAL DATA:  Transient expressive aphasia EXAM: MRI HEAD WITHOUT CONTRAST TECHNIQUE: Multiplanar, multiecho pulse sequences of the brain and surrounding structures were obtained without intravenous contrast. COMPARISON:  03/06/2019 FINDINGS: Brain: There are multiple punctate foci of abnormal diffusion restriction within the right centrum semiovale. No contralateral diffusion abnormality. No acute or chronic hemorrhage. There is multifocal hyperintense T2-weighted signal within the white matter. Generalized volume loss without a clear lobar predilection. The midline structures are normal. Vascular: Major flow voids are preserved. Skull and upper cervical spine: Normal calvarium and skull base. Visualized upper cervical spine and soft tissues are normal. Sinuses/Orbits:No paranasal sinus fluid levels or advanced mucosal thickening. No mastoid or middle ear effusion. Normal orbits. IMPRESSION: Multiple punctate foci of acute ischemia within the right centrum semiovale. No hemorrhage or mass effect. Electronically Signed   By: Ulyses Jarred M.D.   On: 06/03/2020 04:50   MR 3D Recon At Scanner  Result Date: 05/30/2020 CLINICAL DATA:  Right upper  quadrant abdominal pain. Cholelithiasis. EXAM: MRI ABDOMEN WITHOUT AND WITH CONTRAST (INCLUDING MRCP) TECHNIQUE: Multiplanar multisequence MR imaging of the abdomen was performed both before and after the administration of intravenous contrast. Heavily T2-weighted images of the biliary and pancreatic ducts were obtained, and three-dimensional MRCP images were rendered by post processing. CONTRAST:  7.74mL GADAVIST GADOBUTROL 1 MMOL/ML IV SOLN COMPARISON:  Ultrasound examination, same date. FINDINGS: Examination is limited due to respiratory motion. Lower chest: The lung bases are clear of an acute process. No pleural effusions or pericardial effusion. Hepatobiliary: No hepatic lesions are identified. No intrahepatic biliary dilatation. Layering dependent gallstones are noted in the gallbladder but no MR findings suspicious for acute cholecystitis. No common bile duct dilatation. Suspect small common bile duct stones distally. Low insertion of the cystic. Pancreas: Significant pancreatic atrophy. This is most notable in the distal body/tail regions there is virtually no pancreatic tissue. Pancreatic duct is abruptly dilated at the midbody level no pancreatic mass is identified. This could be due to a ductal stricture. Spleen:  Normal size. No focal lesions. Adrenals/Urinary Tract:  The adrenal glands are unremarkable. There are simple renal cysts and a 10 mm left renal calculus. No worrisome renal lesions or hydronephrosis. Stomach/Bowel: The stomach, duodenum, visualized small bowel and visualized colon are grossly normal. Vascular/Lymphatic: Advanced atherosclerotic calcifications involving the aorta but aneurysm dissection. The branch vessels  are patent. The major venous structures are patent. Other: No ascites or abdominal hernia. No obvious free air or free fluid. Musculoskeletal: No significant bony findings. IMPRESSION: 1. Examination is limited due to respiratory motion. 2. Cholelithiasis but no MR findings  for acute cholecystitis. 3. Suspect small common bile duct stones distally. No common bile duct dilatation. 4. Significant pancreatic atrophy. There is also pancreatic duct dilatation in the mid body tail region with an abrupt transition but no obvious obstructing lesion. Possible pancreatic stricture. CT abdomen with pancreatic protocol may be helpful for further evaluation. 5. Simple renal cysts and a 10 mm left renal calculus. Electronically Signed   By: Marijo Sanes M.D.   On: 05/30/2020 05:13   CT Code Stroke Cerebral Perfusion with contrast  Result Date: 06/21/2020 CLINICAL DATA:  Focal neurological deficit, stroke suspected. Aphasia. EXAM: CT ANGIOGRAPHY HEAD AND NECK CT PERFUSION BRAIN TECHNIQUE: Multidetector CT imaging of the head and neck was performed using the standard protocol during bolus administration of intravenous contrast. Multiplanar CT image reconstructions and MIPs were obtained to evaluate the vascular anatomy. Carotid stenosis measurements (when applicable) are obtained utilizing NASCET criteria, using the distal internal carotid diameter as the denominator. Multiphase CT imaging of the brain was performed following IV bolus contrast injection. Subsequent parametric perfusion maps were calculated using RAPID software. CONTRAST:  176mL OMNIPAQUE IOHEXOL 350 MG/ML SOLN COMPARISON:  None. Head CT June 21, 2020. FINDINGS: CTA NECK FINDINGS Aortic arch: Standard branching. Imaged portion shows no evidence of aneurysm or dissection. Calcified plaques are noted in the aortic arch. No significant stenosis of the major arch vessel origins. Right carotid system: Mild atherosclerotic changes in the right carotid bifurcation. No evidence of dissection, stenosis (50% or greater) or occlusion. Left carotid system: Mild atherosclerotic changes in the left carotid bifurcation. No evidence of dissection, stenosis (50% or greater) or occlusion. Vertebral arteries: Right dominant. No evidence of  dissection, stenosis (50% or greater) or occlusion. Skeleton: Degenerative changes of the cervical spine. No aggressive bone lesion identified. Other neck: Negative. Upper chest: Bilateral pleural effusion. Prominent (11 mm) paratracheal lymph node (series 3, image 157). Review of the MIP images confirms the above findings CTA HEAD FINDINGS Anterior circulation: Calcified plaques in the bilateral carotid siphons without hemodynamically significant stenosis. Mild stenosis of the supraclinoid segment of the left ICA. Early bifurcation of the bilateral M1/MCA segments noted with moderate stenosis at the origin of the left M2/MCA posterior division branch (series 9, image 19). No proximal occlusion, aneurysm, or vascular malformation. Posterior circulation: Patent V4 segment of the bilateral vertebral arteries. The basilar artery has small caliber throughout its course which may be related to the presence of bilateral fetal PCAs. The bilateral posterior cerebral arteries which originate directly from the corresponding ICAs, show mild luminal irregularity likely related to intracranial atherosclerotic disease without hemodynamically significant stenosis. Venous sinuses: As permitted by contrast timing, patent. Anatomic variants: Bilateral fetal PCAs. Review of the MIP images confirms the above findings CT Brain Perfusion Findings: ASPECTS: 10 CBF (<30%) Volume: 18mL Perfusion (Tmax>6.0s) volume: 40mL Mismatch Volume: 50mL Infarction Location:Not applicable. IMPRESSION: 1. No large vessel occlusion. No core infarct or penumbra. 2. Mild atherosclerotic changes in the bilateral carotid bifurcations. No hemodynamically significant stenosis in the neck. 3. Moderate stenosis at the origin of the left M2/MCA posterior division branch. 4. Small caliber basilar artery which may be related to the presence of bilateral fetal PCAs. 5. Bilateral pleural effusion. 6. Prominent paratracheal lymph node, nonspecific. These results were  called by telephone at the time of interpretation on 06/21/2020 at 10:59 am to provider [facility] , who verbally acknowledged these results. Aortic Atherosclerosis (ICD10-I70.0). Electronically Signed   By: Pedro Earls M.D.   On: 06/21/2020 10:59   DG ERCP BILIARY & PANCREATIC DUCTS  Result Date: 05/31/2020 CLINICAL DATA:  ERCP. EXAM: ERCP TECHNIQUE: Multiple spot images obtained with the fluoroscopic device and submitted for interpretation post-procedure. COMPARISON:  MRCP-05/30/2020 FLUOROSCOPY TIME:  2 minutes, 33 seconds (22.1 mGy) FINDINGS: Twelve spot intraoperative fluoroscopic images of the right upper abdominal quadrant during ERCP are provided for review Initial image demonstrates an ERCP probe overlying the right upper abdominal quadrant Subsequent images demonstrate selective cannulation and opacification of the CBD which appears moderately dilated. There is a lenticular for filling defect within the distal aspect of the CBD likely compatible with questioned choledocholithiasis on preceding MRCP. Subsequent images demonstrate plasty at the level of the biliary ampulla with subsequent biliary sweeping and sphincterotomy. There is minimal opacification the cystic duct and gallbladder lumen. There is minimal opacification of the intrahepatic biliary tree which appears nondilated. There is no definitive opacification of the pancreatic duct. IMPRESSION: ERCP with findings of choledocholithiasis, biliary plasty, sweeping and presumed sphincterotomy. These images were submitted for radiologic interpretation only. Please see the procedural report for the amount of contrast and the fluoroscopy time utilized. Electronically Signed   By: Sandi Mariscal M.D.   On: 05/31/2020 13:09   EEG adult  Result Date: 06/21/2020 Lora Havens, MD     06/21/2020  4:08 PM Patient Name: HERA CELAYA MRN: [id number] Epilepsy Attending: Lora Havens Referring Physician/Provider: Clance Boll, NP Date: 06/21/2020 Duration: 24.37 mins Patient history: 87 y.o. female with a history of multiple recurrent spells of uncertain etiology.  EEG to evaluate for seizure Level of alertness: Awake, asleep AEDs during EEG study: LEV Technical aspects: This EEG study was done with scalp electrodes positioned according to the 10-20 International system of electrode placement. Electrical activity was acquired at a sampling rate of 500Hz  and reviewed with a high frequency filter of 70Hz  and a low frequency filter of 1Hz . EEG data were recorded continuously and digitally stored. Description: The posterior dominant rhythm consists of 7.5-8 Hz activity of moderate voltage (25-35 uV) seen predominantly in posterior head regions, symmetric and reactive to eye opening and eye closing. Sleep was characterized by vertex waves, sleep spindles (12 to 14 Hz), maximal frontocentral region. EEG showed intermittent generalized 3 to 6 Hz theta-delta slowing. Physiologic photic driving was not seen during photic stimulation. Hyperventilation was not performed.   ABNORMALITY -Intermittent slow, generalized IMPRESSION: This study is suggestive of mild diffuse encephalopathy, nonspecific etiology. No seizures or epileptiform discharges were seen throughout the recording. Lora Havens   EEG adult  Result Date: 06/02/2020 Lora Havens, MD     06/02/2020 11:09 PM Patient Name: MELVINA PANGELINAN MRN: [id number] Epilepsy Attending: Lora Havens Referring Physician/Provider: Dr Carlisle Cater Date: 06/02/2020 Duration: 23.50 mins Patient history:  87yo F with acute transient episodes of expressive aphasia today. EEG to evaluate for siezure Level of alertness: Awake, asleep AEDs during EEG study: LEV Technical aspects: This EEG study was done with scalp electrodes positioned according to the 10-20 International system of electrode placement. Electrical activity was acquired at a sampling rate of 500Hz  and reviewed with  a high frequency filter of 70Hz  and a low frequency filter of 1Hz . EEG data were recorded continuously and digitally stored.  Description: The posterior dominant rhythm consists of 7.5 Hz activity of moderate voltage (25-35 uV) seen predominantly in posterior head regions, symmetric and reactive to eye opening and eye closing. Sleep was characterized by vertex waves, sleep spindles (12 to 14 Hz), maximal frontocentral region.  EEG showed intermittent right>left temporal 2-3Hz  delta slowing.  Hyperventilation and photic stimulation were not performed.   ABNORMALITY -Intermittent slow, right>left temporal region IMPRESSION: This study is suggestive of cortical dysfunction in right more than left temporal region, non specific etiology. No seizures or epileptiform discharges were seen throughout the recording. Lora Havens   MR ABDOMEN MRCP W WO CONTAST  Result Date: 05/30/2020 CLINICAL DATA:  Right upper quadrant abdominal pain. Cholelithiasis. EXAM: MRI ABDOMEN WITHOUT AND WITH CONTRAST (INCLUDING MRCP) TECHNIQUE: Multiplanar multisequence MR imaging of the abdomen was performed both before and after the administration of intravenous contrast. Heavily T2-weighted images of the biliary and pancreatic ducts were obtained, and three-dimensional MRCP images were rendered by post processing. CONTRAST:  7.57mL GADAVIST GADOBUTROL 1 MMOL/ML IV SOLN COMPARISON:  Ultrasound examination, same date. FINDINGS: Examination is limited due to respiratory motion. Lower chest: The lung bases are clear of an acute process. No pleural effusions or pericardial effusion. Hepatobiliary: No hepatic lesions are identified. No intrahepatic biliary dilatation. Layering dependent gallstones are noted in the gallbladder but no MR findings suspicious for acute cholecystitis. No common bile duct dilatation. Suspect small common bile duct stones distally. Low insertion of the cystic. Pancreas: Significant pancreatic atrophy. This is most  notable in the distal body/tail regions there is virtually no pancreatic tissue. Pancreatic duct is abruptly dilated at the midbody level no pancreatic mass is identified. This could be due to a ductal stricture. Spleen:  Normal size. No focal lesions. Adrenals/Urinary Tract:  The adrenal glands are unremarkable. There are simple renal cysts and a 10 mm left renal calculus. No worrisome renal lesions or hydronephrosis. Stomach/Bowel: The stomach, duodenum, visualized small bowel and visualized colon are grossly normal. Vascular/Lymphatic: Advanced atherosclerotic calcifications involving the aorta but aneurysm dissection. The branch vessels are patent. The major venous structures are patent. Other: No ascites or abdominal hernia. No obvious free air or free fluid. Musculoskeletal: No significant bony findings. IMPRESSION: 1. Examination is limited due to respiratory motion. 2. Cholelithiasis but no MR findings for acute cholecystitis. 3. Suspect small common bile duct stones distally. No common bile duct dilatation. 4. Significant pancreatic atrophy. There is also pancreatic duct dilatation in the mid body tail region with an abrupt transition but no obvious obstructing lesion. Possible pancreatic stricture. CT abdomen with pancreatic protocol may be helpful for further evaluation. 5. Simple renal cysts and a 10 mm left renal calculus. Electronically Signed   By: Marijo Sanes M.D.   On: 05/30/2020 05:13   ECHOCARDIOGRAM COMPLETE  Result Date: 06/03/2020    ECHOCARDIOGRAM REPORT   Patient Name:   ALEANA FIFITA Date of Exam: 06/03/2020 Medical Rec #:  [id number]      Height:       59.0 in Accession #:    [id number]     Weight:       112.0 lb Date of Birth:  03-08-1933      BSA:          1.442 m Patient Age:    59 years       BP:           157/67 mmHg Patient Gender: F  HR:           61 bpm. Exam Location:  Inpatient Procedure: 2D Echo, Cardiac Doppler and Color Doppler Indications:    Stroke   History:        Patient has prior history of Echocardiogram examinations, most                 recent 03/06/2019. Abnormal ECG, Stroke, Arrythmias:Atrial                 Fibrillation, Signs/Symptoms:Dyspnea; Risk Factors:Hypertension                 and Diabetes.  Sonographer:    Roseanna Rainbow RDCS Referring Phys: [id number] [place] [facility]  Sonographer Comments: Technically difficult study due to poor echo windows. Image acquisition challenging due to patient body habitus. IMPRESSIONS  1. Left ventricular ejection fraction, by estimation, is 60 to 65%. The left ventricle has normal function. The left ventricle has no regional wall motion abnormalities. Left ventricular diastolic parameters are consistent with Grade II diastolic dysfunction (pseudonormalization).  2. Right ventricular systolic function is mildly reduced. The right ventricular size is normal. There is severely elevated pulmonary artery systolic pressure. The estimated right ventricular systolic pressure is 24.0 mmHg.  3. The mitral valve is normal in structure. Mild mitral valve regurgitation. No evidence of mitral stenosis. Moderate mitral annular calcification.  4. The aortic valve is tricuspid. Aortic valve regurgitation is not visualized. Mild aortic valve sclerosis is present, with no evidence of aortic valve stenosis.  5. The inferior vena cava is normal in size with <50% respiratory variability, suggesting right atrial pressure of 8 mmHg.  6. There is a pleural effusion. FINDINGS  Left Ventricle: Left ventricular ejection fraction, by estimation, is 60 to 65%. The left ventricle has normal function. The left ventricle has no regional wall motion abnormalities. The left ventricular internal cavity size was normal in size. There is  no left ventricular hypertrophy. Left ventricular diastolic parameters are consistent with Grade II diastolic dysfunction (pseudonormalization). Right Ventricle: The right ventricular size is normal. No increase in right  ventricular wall thickness. Right ventricular systolic function is mildly reduced. There is severely elevated pulmonary artery systolic pressure. The tricuspid regurgitant velocity is 3.63 m/s, and with an assumed right atrial pressure of 8 mmHg, the estimated right ventricular systolic pressure is 97.3 mmHg. Left Atrium: Left atrial size was normal in size. Right Atrium: Right atrial size was normal in size. Pericardium: There is a pleural effusion. There is no evidence of pericardial effusion. Mitral Valve: The mitral valve is normal in structure. There is mild calcification of the mitral valve leaflet(s). Moderate mitral annular calcification. Mild mitral valve regurgitation. No evidence of mitral valve stenosis. MV peak gradient, 8.4 mmHg. The mean mitral valve gradient is 2.0 mmHg. Tricuspid Valve: The tricuspid valve is normal in structure. Tricuspid valve regurgitation is trivial. Aortic Valve: The aortic valve is tricuspid. Aortic valve regurgitation is not visualized. Mild aortic valve sclerosis is present, with no evidence of aortic valve stenosis. Pulmonic Valve: The pulmonic valve was normal in structure. Pulmonic valve regurgitation is not visualized. Aorta: The aortic root is normal in size and structure. Venous: The inferior vena cava is normal in size with less than 50% respiratory variability, suggesting right atrial pressure of 8 mmHg. IAS/Shunts: No atrial level shunt detected by color flow Doppler.  LEFT VENTRICLE PLAX 2D LVIDd:         3.15 cm     Diastology LVIDs:  2.00 cm     LV e' medial:    3.57 cm/s LV PW:         1.05 cm     LV E/e' medial:  19.8 LV IVS:        1.00 cm     LV e' lateral:   2.76 cm/s LVOT diam:     1.50 cm     LV E/e' lateral: 25.6 LV SV:         41 LV SV Index:   29 LVOT Area:     1.77 cm  LV Volumes (MOD) LV vol d, MOD A2C: 34.0 ml LV vol d, MOD A4C: 40.7 ml LV vol s, MOD A2C: 15.3 ml LV vol s, MOD A4C: 10.5 ml LV SV MOD A2C:     18.7 ml LV SV MOD A4C:     40.7 ml  LV SV MOD BP:      24.1 ml RIGHT VENTRICLE            IVC RV S prime:     5.77 cm/s  IVC diam: 1.80 cm TAPSE (M-mode): 1.2 cm LEFT ATRIUM             Index       RIGHT ATRIUM           Index LA diam:        3.30 cm 2.29 cm/m  RA Area:     10.30 cm LA Vol (A2C):   15.9 ml 11.03 ml/m RA Volume:   19.80 ml  13.74 ml/m LA Vol (A4C):   19.1 ml 13.25 ml/m LA Biplane Vol: 17.7 ml 12.28 ml/m  AORTIC VALVE LVOT Vmax:   101.00 cm/s LVOT Vmean:  64.900 cm/s LVOT VTI:    0.234 m  AORTA Ao Root diam: 2.30 cm MITRAL VALVE               TRICUSPID VALVE MV Area (PHT): 4.15 cm    TR Peak grad:   52.7 mmHg MV Peak grad:  8.4 mmHg    TR Vmax:        363.00 cm/s MV Mean grad:  2.0 mmHg MV Vmax:       1.45 m/s    SHUNTS MV Vmean:      56.3 cm/s   Systemic VTI:  0.23 m MV Decel Time: 183 msec    Systemic Diam: 1.50 cm MV E velocity: 70.78 cm/s Loralie Champagne MD Electronically signed by Loralie Champagne MD Signature Date/Time: 06/03/2020/12:42:02 PM    Final    CT HEAD CODE STROKE WO CONTRAST  Result Date: 06/21/2020 CLINICAL DATA:  Code stroke.  Acute neuro deficit.  Aphasia EXAM: CT HEAD WITHOUT CONTRAST TECHNIQUE: Contiguous axial images were obtained from the base of the skull through the vertex without intravenous contrast. COMPARISON:  CT head 05/29/2020 FINDINGS: Brain: Moderate atrophy. Patchy white matter hypodensity bilaterally stable. Chronic infarct chronic lacunar infarction in the right internal capsule unchanged. Negative for acute infarct, hemorrhage, mass. Vascular: Negative for hyperdense vessel. Skull: Negative Sinuses/Orbits: Paranasal sinuses clear. Bilateral cataract extraction. Other: None ASPECTS ([place] Stroke Program Early CT Score) - Ganglionic level infarction (caudate, lentiform nuclei, internal capsule, insula, M1-M3 cortex): 7 - Supraganglionic infarction (M4-M6 cortex): 3 Total score (0-10 with 10 being normal): 10 IMPRESSION: 1. No acute abnormality and no change from the recent study 2. Atrophy  and chronic microvascular ischemia, stable 3. ASPECTS is 10 4. Code stroke imaging results were communicated on 06/21/2020 at 10:09 am  to provider Leonel Ramsay via text page Electronically Signed   By: Franchot Gallo M.D.   On: 06/21/2020 10:10   VAS US CAROTID  Result Date: 06/05/2020 Carotid Arterial Duplex Study Indications:  Speech disturbance. Risk Factors: Hyperlipidemia, Diabetes. Performing Technologist: Maudry Mayhew MHA, RDMS, RVT, RDCS  Examination Guidelines: A complete evaluation includes B-mode imaging, spectral Doppler, color Doppler, and power Doppler as needed of all accessible portions of each vessel. Bilateral testing is considered an integral part of a complete examination. Limited examinations for reoccurring indications may be performed as noted.  Right Carotid Findings: +----------+-------+-------+--------+---------------------------------+--------+             PSV     EDV     Stenosis Plaque Description                Comments              cm/s    cm/s                                                         +----------+-------+-------+--------+---------------------------------+--------+  CCA Prox   55      7                                                            +----------+-------+-------+--------+---------------------------------+--------+  CCA Distal 62      9                smooth and heterogenous                     +----------+-------+-------+--------+---------------------------------+--------+  ICA Prox   54      16               smooth and heterogenous                     +----------+-------+-------+--------+---------------------------------+--------+  ICA Distal 73      20                                                           +----------+-------+-------+--------+---------------------------------+--------+  ECA        68                       heterogenous, irregular and                                                      calcific                                     +----------+-------+-------+--------+---------------------------------+--------+ +----------+--------+-------+----------------+-------------------+             PSV cm/s EDV cms Describe  Arm Pressure (mmHG)  +----------+--------+-------+----------------+-------------------+  Subclavian 52               Multiphasic, WNL                      +----------+--------+-------+----------------+-------------------+ +---------+--------+--+--------+-+---------+  Vertebral PSV cm/s 36 EDV cm/s 7 Antegrade  +---------+--------+--+--------+-+---------+  Left Carotid Findings: +----------+--------+--------+--------+---------------------+------------------+             PSV cm/s EDV cm/s Stenosis Plaque Description    Comments            +----------+--------+--------+--------+---------------------+------------------+  CCA Prox   58       9                                       intimal thickening  +----------+--------+--------+--------+---------------------+------------------+  CCA Distal 70       13                smooth and                                                                       heterogenous                              +----------+--------+--------+--------+---------------------+------------------+  ICA Prox   71       14                heterogenous and                                                                 irregular                                 +----------+--------+--------+--------+---------------------+------------------+  ICA Distal 80       12                                                          +----------+--------+--------+--------+---------------------+------------------+  ECA        87                         smooth, heterogenous                                                             and calcific                              +----------+--------+--------+--------+---------------------+------------------+ +----------+--------+--------+----------------+-------------------+  PSV cm/s EDV cm/s Describe         Arm Pressure (mmHG)  +----------+--------+--------+----------------+-------------------+  Subclavian 80                Multiphasic, WNL                      +----------+--------+--------+----------------+-------------------+ +---------+--------+--+--------+-+---------+  Vertebral PSV cm/s 37 EDV cm/s 6 Antegrade  +---------+--------+--+--------+-+---------+   Summary: Right Carotid: Velocities in the right ICA are consistent with a 1-39% stenosis. Left Carotid: Velocities in the left ICA are consistent with a 1-39% stenosis. Vertebrals:  Bilateral vertebral arteries demonstrate antegrade flow. Subclavians: Normal flow hemodynamics were seen in bilateral subclavian              arteries. *See table(s) above for measurements and observations.  Electronically signed by Antony Contras MD on 06/05/2020 at 2:20:46 PM.    Final    US Abdomen Limited RUQ (LIVER/GB)  Result Date: 05/29/2020 CLINICAL DATA:  87 year old female with right upper quadrant abdominal pain. EXAM: ULTRASOUND ABDOMEN LIMITED RIGHT UPPER QUADRANT COMPARISON:  Right upper quadrant ultrasound dated 06/13/2009. FINDINGS: Gallbladder: There is sludge and small stones within the gallbladder. There is no gallbladder wall thickening or pericholecystic fluid. Negative sonographic Murphy sign. Common bile duct: Diameter: 5 mm Liver: Coarsened liver echotexture with slight surface nodularity consistent morphologic changes of cirrhosis. Portal vein is patent on color Doppler imaging with normal direction of blood flow towards the liver. Other: None. IMPRESSION: 1. Cholelithiasis without sonographic evidence of acute cholecystitis. 2. Cirrhosis. 3. Patent main portal vein with hepatopetal flow. Electronically Signed   By: Anner Crete M.D.   On: 05/29/2020 22:19    Labs:  Basic Metabolic Panel: Recent Labs  Lab 06/20/20 0518 06/22/20 0711  NA 140 137  K 3.7 3.3*  CL 101 98  CO2 27 29  GLUCOSE 39* 104*  BUN 22  28*  CREATININE 1.41* 1.68*  CALCIUM 8.3* 8.1*    CBC: Recent Labs  Lab 06/19/20 0458  WBC 8.8  HGB 8.4*  HCT 25.3*  MCV 96.9  PLT 213    CBG: Recent Labs  Lab 06/23/20 0558 06/23/20 1126 06/23/20 1624 06/23/20 2120 06/23/20 2146  GLUCAP 103* 147* 303* 57* 79   Family history.  Father with myocardial infarction CVA diabetes mellitus hypertension.  Mother with breast cancer.  Negative for asthma, colon cancer esophageal cancer rectal cancer stomach cancer  Brief HPI:   Taylor Castaneda is a 87 y.o. right-handed female with history of diabetes mellitus hyperlipidemia seizure disorder maintained on Keppra history of colon cancer status post resection 1998 by Dr. Rosana Hoes, glaucoma followed by Dr. Herbert Deaner and chronic anemia..  Per chart review lives alone independent with assistive device.  1 level home 2 steps to entry.  She does have good family support.  Presented to [facility] 05/30/2020 after being found down with complaints of abdominal pain right upper quadrant, epigastrium that radiated around to the ipsilateral scapula and shoulder.  There was no nausea or vomiting.  No obvious trauma.  There was some question of possible seizure.  Cranial CT scan and CT cervical spine no evidence of acute abnormality.  CT thoracic lumbar cervical spine again showed no acute osseous abnormality identified.  There was severe stenosis at L4-5.  Ultrasound the abdomen cholelithiasis without significant sonographic evidence of acute cholecystitis.  MRI of the abdomen cholelithiasis but again no acute cholecystitis small common bile duct stones distally no common bile duct dilation.  Significant pancreatic atrophy.  Admission chemistries WBC 17,800 hemoglobin 11.4 glucose 205 BUN 37 creatinine 1.40 lactic acid 4.0 troponin 18 lipase 25 blood cultures no growth to date.  Patient underwent ERCP 05/31/2020 per Dr. Lyndel Safe showing choledocholithiasis found status post biliary sphincterotomy, sphincteroplasty and balloon  extraction, cholelithiasis without cholecystitis no pus was found pancreatogram was not obtained.  General surgery consulted after ERCP completed underwent laparoscopic cholecystectomy 06/01/2020 per Dr. Windle Guard.  Neurology consulted EEG negative for seizure.  Echocardiogram with ejection fraction of 60 to 65% no wall motion abnormalities grade 2 diastolic dysfunction.  Patient with acute onset of aphasia again neurology follow-up 06/02/2020 MRI showing multiple punctate foci of acute ischemia within the right centrum semiovale.  No hemorrhage or mass-effect.  MRA no large vessel occlusion identified.  Patient was cleared to begin aspirin 325 mg and Plavix for CVA prophylaxis x3 months then Plavix alone.  Subcutaneous heparin for DVT prophylaxis.  Tolerating a regular diet.  Due to patient decreased functional ability and aphasia she was admitted for a comprehensive rehab program.   Hospital Course: SHIRLEYMAE HAUTH was admitted to rehab 06/10/2020 for inpatient therapies to consist of PT, ST and OT at least three hours five days a week. Past admission physiatrist, therapy team and rehab RN have worked together to provide customized collaborative inpatient rehab.  Pertaining to patient's aphasia secondary to several punctate right centrum semiovale infarct likely due to severe intracranial stenosis remained followed closely by neurology services currently maintained on aspirin 325 mg daily and Plavix 75 mg daily x3 months then Plavix alone.  Follow-up neurology services 06/22/2019 due to increase aphasia as well as mild right arm and facial weakness that resolved quickly.  Follow-up EEG 06/21/2020 showed no seizure however there was suggestion of mild diffuse encephalopathy.  Follow-up MRI showed several foci of restricted diffusion within the deep white matter of the right frontal parietal region suggestive of watershed infarct.  Moderate parenchymal volume loss and probable chronic small vessel ischemia.  She remained  on Keppra for seizure prophylaxis.  In regards to patient's cholelithiasis status post ERCP followed by laparoscopic cholecystectomy 06/01/2020 denied any pain or discomfort would follow-up with general surgery.  Blood pressure monitored closely avoiding any hypoperfusion currently maintained on  Coreg .  Low-dose Lasix discontinued due to mild elevation in BUN. Lipitor for hyperlipidemia.  Blood sugars controlled hemoglobin A1c 8.7 insulin therapy as directed.  Patient with a history of acute on chronic anemia GI follow-up as recommended currently hemoglobin stable 8.4.   Blood pressures were monitored on TID basis and soft and monitored  Diabetes has been monitored with ac/hs CBG checks and SSI was use prn for tighter BS control.    Rehab course: During patient's stay in rehab weekly team conferences were held to monitor patient's progress, set goals and discuss barriers to discharge. At admission, patient required minimal assist 180 feet rolling walker minimal assist sit to stand minimal assist sit to supine.  Minimal assist grooming set up for upper body dressing minimal assist lower body dressing minimal assist toilet transfers  Physical exam.  Blood pressure 179/71 pulse 68 temperature 98.1 respirations 18 oxygen saturation 99% room air Constitutional.  No acute distress HEENT Head.  Normocephalic and atraumatic Eyes.  Pupils round and reactive to light no discharge.nystagmus Neck.  Supple nontender no JVD without thyromegaly Cardiac regular rate rhythm no extra sounds or murmur heard Abdomen.  Soft nontender positive bowel sounds without rebound Respiratory effort normal no respiratory distress without wheeze  Skin.  There was a bleeding laceration right wrist with Kerlix in place Neurologic.  Alert sitting up in chair makes eye contact with examiner.  She did have some expressive aphasia.  Able to ID and say watch.  Comprehension seems intact.  Motor.  4+/5 right upper extremity and 4/5  left upper extremity, right lower extremity 4/5 left lower extremity 4 - to 4/5  He/She  has had improvement in activity tolerance, balance, postural control as well as ability to compensate for deficits. He/She has had improvement in functional use RUE/LUE  and RLE/LLE as well as improvement in awareness.  Patient ambulates short distances rolling walker standby assist to contact-guard.  She does receive rest breaks due to fatigue.  Perform supine to sit SBA for bilateral lower extremities able to push upper body up from bed surface.  Perform sit to stand stand pivot and toilet transfers throughout sessions demonstrates learning with initial reach for rolling walker.  Ambulates transfer to toilet completed using rolling walker contact-guard to minimal assist.  Needed some cues for completing PERI hygiene.  Speech therapy follow-up for aphasia sessions with overall minimal assist verbal cues to identify and describe 3 differences between similar photographs moderate assist written made for recall within tasks.  Full family teaching completed discussed the need for assistance at home family teaching completed and discharged home      Disposition: Discharge to home    Diet: Carb modified  Special Instructions: No driving smoking or alcohol  Plan aspirin 325 mg daily and Plavix 75 mg day x3 months total then Plavix alone  Medications at discharge 1.  Tylenol as needed 2.  Aspirin 325 mg p.o. daily 3.  Lipitor 40 mg p.o. daily 5.  Coreg 12.5 mg p.o. twice daily 6.  Plavix 75 mg p.o. daily 7.  Voltaren gel 2 g 4 times daily to affected area 9.  Lantus insulin 8 units nightly 10.  Keppra 500 mg p.o. twice daily 11.  Protonix 40 mg p.o. twice daily 12.  Ferrous sulfate 3 and 25 mg Mondays and Thursdays 13.  Vitamin D 1000 units p.o. daily   30-35 minutes were spent completing discharge summary and discharge planning  Discharge Instructions    Ambulatory referral to Neurology   Complete by:  As directed    An appointment is requested in approximately 4 weeks right centrum semiovale infarction   Ambulatory referral to Physical Medicine Rehab   Complete by: As directed    Moderate complexity follow-up 1 to 2 weeks right centrum semiovale infarction       Follow-up Information    Kirsteins, Luanna Salk, MD Follow up.   Specialty: Physical Medicine and Rehabilitation Why: Office to call for appointment Contact information: [place] [address] [phone]               Signed: Cathlyn Parsons 06/24/2020, 5:47 AM

## 2020-06-23 ENCOUNTER — Inpatient Hospital Stay (HOSPITAL_COMMUNITY): Payer: Medicare Other

## 2020-06-23 ENCOUNTER — Inpatient Hospital Stay (HOSPITAL_COMMUNITY): Payer: Medicare Other | Admitting: Speech Pathology

## 2020-06-23 ENCOUNTER — Inpatient Hospital Stay (HOSPITAL_COMMUNITY): Payer: Medicare Other | Admitting: Physical Therapy

## 2020-06-23 LAB — GLUCOSE, CAPILLARY
Glucose-Capillary: 103 mg/dL — ABNORMAL HIGH (ref 70–99)
Glucose-Capillary: 147 mg/dL — ABNORMAL HIGH (ref 70–99)
Glucose-Capillary: 303 mg/dL — ABNORMAL HIGH (ref 70–99)
Glucose-Capillary: 57 mg/dL — ABNORMAL LOW (ref 70–99)
Glucose-Capillary: 79 mg/dL (ref 70–99)

## 2020-06-23 MED ORDER — CLOPIDOGREL BISULFATE 75 MG PO TABS: 75.0000 mg | ORAL_TABLET | Freq: Every day | ORAL | 0 refills | Status: DC

## 2020-06-23 MED ORDER — FUROSEMIDE 20 MG PO TABS
10.0000 mg | ORAL_TABLET | Freq: Every day | ORAL | 0 refills | Status: DC
Start: 2020-06-24 — End: 2020-06-24

## 2020-06-23 MED ORDER — ATORVASTATIN CALCIUM 40 MG PO TABS: 40.0000 mg | ORAL_TABLET | Freq: Every day | ORAL | 3 refills | Status: DC

## 2020-06-23 MED ORDER — VITAMIN D3 25 MCG (1000 UT) PO CAPS: 1.0000 | ORAL_CAPSULE | Freq: Every day | ORAL | 0 refills | Status: AC

## 2020-06-23 MED ORDER — IRON 325 (65 FE) MG PO TABS: 325.0000 mg | ORAL_TABLET | ORAL | 0 refills | Status: DC

## 2020-06-23 MED ORDER — POTASSIUM CHLORIDE CRYS ER 10 MEQ PO TBCR: EXTENDED_RELEASE_TABLET | ORAL | 3 refills | Status: DC

## 2020-06-23 MED ORDER — INSULIN GLARGINE 100 UNIT/ML SOLOSTAR PEN: 10.0000 [IU] | PEN_INJECTOR | Freq: Every day | SUBCUTANEOUS | 11 refills | Status: DC

## 2020-06-23 MED ORDER — LEVETIRACETAM 750 MG PO TABS: 750.0000 mg | ORAL_TABLET | Freq: Two times a day (BID) | ORAL | 0 refills | Status: DC

## 2020-06-23 MED ORDER — DICLOFENAC SODIUM 1 % EX GEL: 2.0000 g | Freq: Four times a day (QID) | CUTANEOUS | 0 refills | Status: AC

## 2020-06-23 MED ORDER — CARVEDILOL 12.5 MG PO TABS: 12.5000 mg | ORAL_TABLET | Freq: Two times a day (BID) | ORAL | 3 refills | Status: DC

## 2020-06-23 MED ORDER — PANTOPRAZOLE SODIUM 40 MG PO TBEC: 40.0000 mg | DELAYED_RELEASE_TABLET | Freq: Two times a day (BID) | ORAL | 0 refills | Status: DC

## 2020-06-23 NOTE — Progress Notes (Signed)
Inpatient Rehabilitation Care Coordinator Discharge Note  The overall goal for the admission was met for:   Discharge location: Yes, home  Length of Stay: Yes, 14 Days  Discharge activity level: Yes  Home/community participation: Yes  Services provided included: MD, RD, PT, OT, SLP, RN, CM, TR, Pharmacy, Neuropsych and SW  Financial Services: Private Insurance: [employer]  Choices offered to/list presented OM:VEHMCNO/BSJ  Follow-up services arranged: Home Health: [place]  Comments (or additional information): PT OT [facility]  Patient/Family verbalized understanding of follow-up arrangements: Yes  Individual responsible for coordination of the follow-up plan: [name] ([place]), [phone]  Confirmed correct DME delivered: [name] 06/23/2020    [name]

## 2020-06-23 NOTE — Progress Notes (Signed)
Occupational Therapy Discharge Summary  Patient Details  Name: Taylor Castaneda MRN: [id number] Date of Birth: 1933/05/03  Today's Date: 06/23/2020 OT Individual Time: 424 089 1362 OT Individual Time Calculation (min): 55 min    Patient has met 5 of 11 long term goals due to improved activity tolerance, improved balance, postural control, ability to compensate for deficits, functional use of  LEFT upper extremity, improved attention and improved coordination.  Patient to discharge at overall supervision to min A level.  Patient's care partner is independent to provide the necessary physical and cognitive assistance at discharge.    Reasons goals not met: Goals involving dynamic standing balance not met due to pt requiring CGA to min A to safely sequence steps of transfer/maintain upright posture during activity. Pt additionally with recent medical/neuro status change per epic review and PT report, impeding consistent ADL/functional transfer performance. Cont to req min to mod VCs for safety awareness during BADLs/funcitonal transfers.  Recommendation:  Patient will benefit from ongoing skilled OT services in home health setting to continue to advance functional skills in the area of BADL and Reduce care partner burden.  Equipment: No equipment provided  Reasons for discharge: discharge from hospital  Patient/family agrees with progress made and goals achieved: Yes  OT Discharge Precautions/Restrictions  Precautions Precautions: Fall;Other (comment) Precaution Comments: monitor vitals with activity, hx of seizures Restrictions Weight Bearing Restrictions: No Pain Pain Assessment Pain Scale: 0-10 Pain Score: 0-No pain ADL ADL Eating: Supervision/safety Where Assessed-Eating: Edge of bed Grooming: Supervision/safety Where Assessed-Grooming: Edge of bed Upper Body Bathing: Supervision/safety Where Assessed-Upper Body Bathing: Shower Lower Body Bathing: Minimal assistance Where  Assessed-Lower Body Bathing: Shower Upper Body Dressing: Supervision/safety Where Assessed-Upper Body Dressing: Edge of bed Lower Body Dressing: Minimal assistance Where Assessed-Lower Body Dressing: Wheelchair Toileting: Minimal assistance Where Assessed-Toileting: Glass blower/designer: Close supervision Toilet Transfer Method: Counselling psychologist: Energy manager: Curator Method: Heritage manager: Gaffer Baseline Vision/History: No visual deficits Patient Visual Report: No change from baseline Vision Assessment?: Yes Eye Alignment: Within Functional Limits Ocular Range of Motion: Within Functional Limits Alignment/Gaze Preference: Within Defined Limits Tracking/Visual Pursuits: Able to track stimulus in all quads without difficulty Saccades: Within functional limits Convergence: Impaired (comment) (impaired convergence/divergence of B eyes) Visual Fields: No apparent deficits Perception  Perception: Within Functional Limits Praxis Praxis: Intact Cognition Overall Cognitive Status: Impaired/Different from baseline Arousal/Alertness: Awake/alert Orientation Level: Oriented X4 Attention: Sustained Sustained Attention: Appears intact Memory: Impaired Memory Impairment: Storage deficit;Decreased short term memory;Decreased recall of new information Decreased Short Term Memory: Verbal basic;Functional basic Awareness: Impaired Awareness Impairment: Emergent impairment Problem Solving: Impaired Problem Solving Impairment: Verbal basic;Verbal complex Executive Function:  (all impaired due to lower level deficits) Safety/Judgment: Appears intact Sensation Sensation Light Touch: Appears Intact (light touch intact at B digits) Hot/Cold: Appears Intact Proprioception: Appears Intact Stereognosis: Appears Intact Coordination Gross Motor Movements are Fluid and Coordinated:  No (overall slowed gross motor movements, impaired gait compared to baseline) Fine Motor Movements are Fluid and Coordinated: Yes (grossly WFL for ADLs) Motor  Motor Motor: Hemiplegia Motor - Discharge Observations: slight L hemiparesis, generalized weakness Mobility  Bed Mobility Bed Mobility: Rolling Left;Left Sidelying to Sit;Sit to Supine Rolling Left: Supervision/Verbal cueing Left Sidelying to Sit: Contact Guard/Touching assist Sit to Supine: Supervision/Verbal cueing Transfers Sit to Stand: Supervision/Verbal cueing Stand to Sit: Supervision/Verbal cueing  Trunk/Postural Assessment  Cervical Assessment Cervical Assessment: Exceptions to [facility] (forward head) Thoracic Assessment Thoracic Assessment:  Exceptions to [facility] (rounded shoulders) Lumbar Assessment Lumbar Assessment: Exceptions to [facility] (posterior pelvic tilt) Postural Control Postural Control: Deficits on evaluation (minimally impaired postural control during dynamic standing with BUE unsupported)  Balance Balance Balance Assessed: Yes Static Sitting Balance Static Sitting - Balance Support: Feet supported Static Sitting - Level of Assistance: 5: Stand by assistance Dynamic Sitting Balance Dynamic Sitting - Balance Support: Feet supported Dynamic Sitting - Level of Assistance: 5: Stand by assistance Sitting balance - Comments: close supervision for safety Static Standing Balance Static Standing - Balance Support: Bilateral upper extremity supported Static Standing - Level of Assistance: 5: Stand by assistance Dynamic Standing Balance Dynamic Standing - Balance Support: Bilateral upper extremity supported Dynamic Standing - Level of Assistance: 5: Stand by assistance Extremity/Trunk Assessment RUE Assessment RUE Assessment: Within Functional Limits Active Range of Motion (AROM) Comments: 3/4 to full shoulder flexion General Strength Comments: 4/5 in shoulder flexion LUE Assessment LUE Assessment: Within Functional  Limits Active Range of Motion (AROM) Comments: 4/5 in shoulder flexion General Strength Comments: 4/5 shoulder flexion   Session Note:   Pt received semi-reclined in bed with son present, agreeable to therapy. DC reassessments completed as documented above to measure pt progress. Session focus on shower/toilet transfer, fam edu with son [name], and BUE HEP for improved BUE functional strength/activity tolerance.  Side roll > L with supervision, side lying > sitting EOB with CGA. Donned/doffed B shoes with total A. Sit > stand with RW and amb to bathroom, completed toilet and shower transfer with overall CGA + min VCs for safe RW use/ L foot clearance. Noted word finding difficulty this session.  Completed 2x10 of the following with 1 lb dowel rod: forward/backward rows, bicep curls, chest press, B shoulder flexion. Noted mild L lean during static/dynamic sitting this session. Further completed 2x10 reps with non-weighted ball: overhead raises and BUE diagonal reach.  Stand-pivot back to w/c same manner as before. Transported back to room in w/c and amb ~10 ft to bed with CGA + RW, min VCs for L foot clearance. Pt left semi-reclined in bed with bed alarm engaged, call bell in reach, and all immediate needs met.     Volanda Napoleon, MS, OTR/L 06/23/2020, 3:57 PM

## 2020-06-23 NOTE — Plan of Care (Signed)
Problem: RH Bed to Chair Transfers Goal: LTG Patient will perform bed/chair transfers w/assist (PT) Description: LTG: Patient will perform bed to chair transfers with assistance (PT). 06/23/2020 1743 by Alger Simons, PT Outcome: Not Met (add Reason) Note: Pt experiencing possible seizure episode during discharge assessment and performs below recent baseline.  06/23/2020 1737 by Alger Simons, PT Outcome: Not Met (add Reason) Flowsheets (Taken 06/23/2020 1652) LTG: Pt will perform Bed to Chair Transfers with assistance level: Minimal Assistance - Patient > 75% Note: Pt experiencing possible seizure episode during discharge assessment and performs below recent baseline.  06/23/2020 1652 by Alger Simons, PT Outcome: Not Met (add Reason) Flowsheets (Taken 06/23/2020 1652) LTG: Pt will perform Bed to Chair Transfers with assistance level: Minimal Assistance - Patient > 75% Note: Pt experiencing suspected seizure episode during discharge assessment and unable to perform at most recent baseline. 06/23/2020 1139 by Alger Simons, PT Outcome: Not Met (add Reason) Flowsheets (Taken 06/23/2020 1139) LTG: Pt will perform Bed to Chair Transfers with assistance level: Contact Guard/Touching assist Note: Pt demonstrating difficulty with aphasia and difficulty following instructions, motor planning and control, coordinated movements during discharge assessment.   Problem: RH Car Transfers Goal: LTG Patient will perform car transfers with assist (PT) Description: LTG: Patient will perform car transfers with assistance (PT). 06/23/2020 1743 by Alger Simons, PT Outcome: Not Met (add Reason) Note: Pt experiencing possible seizure episode during discharge assessment and performs below recent baseline.  06/23/2020 1737 by Alger Simons, PT Outcome: Not Met (add Reason) Flowsheets (Taken 06/23/2020 1652) LTG: Pt will perform car transfers with assist:: Minimal Assistance - Patient > 75% Note: Pt  experiencing possible seizure episode during discharge assessment and performs below recent baseline.  06/23/2020 1652 by Alger Simons, PT Outcome: Not Met (add Reason) Flowsheets (Taken 06/23/2020 1652) LTG: Pt will perform car transfers with assist:: Minimal Assistance - Patient > 75% Note: Pt experiencing suspected seizure episode during discharge assessment and unable to perform at most recent baseline.   Problem: RH Ambulation Goal: LTG Patient will ambulate in controlled environment (PT) Description: LTG: Patient will ambulate in a controlled environment, # of feet with assistance (PT). 06/23/2020 1743 by Alger Simons, PT Outcome: Not Met (add Reason) Note: Pt experiencing possible seizure episode during discharge assessment and performs below recent baseline.  06/23/2020 1737 by Alger Simons, PT Outcome: Not Met (add Reason) Flowsheets Taken 06/23/2020 1652 by Alger Simons, PT LTG: Pt will ambulate in controlled environ  assist needed:: Contact Guard/Touching assist Taken 06/11/2020 1840 by Tawana Scale, PT LTG: Ambulation distance in controlled environment: 135f using LRAD Note: Pt experiencing possible seizure episode during discharge assessment and performs below recent baseline.  06/23/2020 1652 by KAlger Simons PT Outcome: Not Met (add Reason) Flowsheets Taken 06/23/2020 1652 by KAlger Simons PT LTG: Pt will ambulate in controlled environ  assist needed:: Contact Guard/Touching assist Taken 06/11/2020 1840 by PTawana Scale PT LTG: Ambulation distance in controlled environment: 1576fusing LRAD Note: Pt experiencing suspected seizure episode during discharge assessment and unable to perform at most recent baseline. Goal: LTG Patient will ambulate in home environment (PT) Description: LTG: Patient will ambulate in home environment, # of feet with assistance (PT). 06/23/2020 1743 by KrAlger SimonsPT Outcome: Not Met (add Reason) Note: Pt experiencing possible  seizure episode during discharge assessment and performs below recent baseline.  06/23/2020 1737 by KrAlger SimonsPT Outcome: Not Met (add Reason)  Flowsheets Taken 06/23/2020 1652 by Alger Simons, PT LTG: Pt will ambulate in home environ  assist needed:: Contact Guard/Touching assist Taken 06/11/2020 1840 by Tawana Scale, PT LTG: Ambulation distance in home environment: 31f using LRAD Note: Pt experiencing possible seizure episode during discharge assessment and performs below recent baseline.  06/23/2020 1652 by KAlger Simons PT Outcome: Not Met (add Reason) Flowsheets Taken 06/23/2020 1652 by KAlger Simons PT LTG: Pt will ambulate in home environ  assist needed:: Contact Guard/Touching assist Taken 06/11/2020 1840 by PTawana Scale PT LTG: Ambulation distance in home environment: 525fusing LRAD Note: Pt experiencing suspected seizure episode during discharge assessment and unable to perform at most recent baseline.   Problem: RH Stairs Goal: LTG Patient will ambulate up and down stairs w/assist (PT) Description: LTG: Patient will ambulate up and down # of stairs with assistance (PT) 06/23/2020 1743 by KrAlger SimonsPT Outcome: Not Met (add Reason) Note: Pt experiencing possible seizure episode during discharge assessment and performs below recent baseline.  06/23/2020 1737 by KrAlger SimonsPT Outcome: Not Met (add Reason) Flowsheets Taken 06/23/2020 1652 by KrAlger SimonsPT LTG: Pt will ambulate up/down stairs assist needed:: Minimal Assistance - Patient > 75% Taken 06/11/2020 1840 by PiTawana ScalePT LTG: Pt will  ambulate up and down number of stairs: 4 steps using HRs for community access Note: Pt experiencing possible seizure episode during discharge assessment and performs below recent baseline.  06/23/2020 1652 by KrAlger SimonsPT Outcome: Not Met (add Reason) Flowsheets Taken 06/23/2020 1652 by KrAlger SimonsPT LTG: Pt will ambulate up/down stairs assist  needed:: Minimal Assistance - Patient > 75% Taken 06/11/2020 1840 by PiTawana ScalePT LTG: Pt will  ambulate up and down number of stairs: 4 steps using HRs for community access Note: Pt experiencing suspected seizure episode during discharge assessment and unable to perform at most recent baseline.

## 2020-06-23 NOTE — Progress Notes (Signed)
Speech Language Pathology Daily Session Note  Patient Details  Name: Taylor Castaneda MRN: [id number] Date of Birth: 05-May-1933  Today's Date: 06/23/2020 SLP Individual Time: 1104-1200 SLP Individual Time Calculation (min): 56 min  Short Term Goals: Week 2: SLP Short Term Goal 1 (Week 2): STG=LTG due to remaining length of stay  Skilled Therapeutic Interventions: Pt was seen for skilled ST targeting education with pt and her son, and working toward communication goals. Pt with fluctuations in her functioning, including comprehension and expression, per PT and son's report this morning. SLP discussed strategies to optimize comprehension including reducing distractions and visuals to support verbal instruction. Also discussed compensatory memory and expressive language strategies. Pt did demonstrate some carryover of weather forecast task addressed yesterday, although she is is still requiring consistent Min A semantic and occasionally phonemic cueing for word finding and speech fluency in generative and structured language tasks. Pt left laying in bed with alarm set and all needs within reach, son still present. Questions were answered to his satisfaction. Continue per current plan of care.        Pain Pain Assessment Pain Scale: 0-10 Pain Score: 0-No pain  Therapy/Group: Individual Therapy  Arbutus Leas 06/23/2020, 12:13 PM

## 2020-06-23 NOTE — Significant Event (Signed)
Hypoglycemic Event  CBG: 57  Treatment: orange juice   Symptoms: None noted   Follow-up CBG: Time:2146 CBG Result:79  Possible Reasons for Event: Novolog 7 units given at dinner time.    Comments/MD notified:    Celesta Aver

## 2020-06-23 NOTE — Progress Notes (Signed)
Physical Therapy Discharge Summary  Patient Details  Name: Taylor Castaneda MRN: [id number] Date of Birth: 1932-08-08  Today's Date: 06/23/2020 PT Individual Time: 9833-8250 PT individual Time Calculation (min): 75 min   Patient has met 4 of 10 long term goals due to improved activity tolerance, improved balance, increased strength, functional use of  left upper extremity and left lower extremity and improved coordination.  Patient to discharge at an ambulatory level [place].   Patient's care partner is independent to provide the necessary physical assistance at discharge.  Reasons goals not met: During discharge assessment of goals, pt experienced suspected seizure episode and was unable to perform most transfers, stairs and gait activity at level of most recent baseline. This session, she required up to CGA/ Min A for some mobility. However, prior to episode, pt was able to perform at overall close supervision level.   Recommendation:  Patient will benefit from ongoing skilled PT services in home health setting to continue to advance safe functional mobility, address ongoing impairments in activity tolerance, strength, balance, coordination, safety awareness, and minimize fall risk.  Equipment: no additional equipment required at this time  Reasons for discharge: discharge from hospital  Patient/family agrees with progress made and goals achieved: Yes  PT Discharge Precautions/Restrictions Precautions Precautions: Fall;Other (comment) Precaution Comments: monitor vitals with activity, hx of seizures Restrictions Weight Bearing Restrictions: No Vital Signs Therapy Vitals Temp: 97.8 F (36.6 C) Pulse Rate: 77 Resp: 18 BP: (!) 113/51 Patient Position (if appropriate): Lying Oxygen Therapy SpO2: 99 % O2 Device: Room Air Pain Pain Assessment Pain Scale: 0-10 Pain Score: 0-No pain Vision/Perception  Vision - Assessment Eye Alignment: Within Functional Limits Ocular Range  of Motion: Within Functional Limits Alignment/Gaze Preference: Within Defined Limits Tracking/Visual Pursuits: Able to track stimulus in all quads without difficulty Saccades: Within functional limits Convergence: Impaired (comment) (impaired convergence/divergence of B eyes) Perception Perception: Within Functional Limits Praxis Praxis: Impaired Praxis Impairment Details: Motor planning Praxis-Other Comments: reduced executive function from baseline exhibited, functional coordination deficits exhibited during discharge assessment  Cognition Overall Cognitive Status: Impaired/Different from baseline Arousal/Alertness: Awake/alert (Awake; reduced alertness exhibited during discharge assessment) Orientation Level: Oriented to person;Oriented to place Attention: Sustained Sustained Attention: Impaired Sustained Attention Impairment: Functional basic (only exhibited during suspected seizure episode) Memory: Impaired Memory Impairment: Decreased short term memory;Storage deficit;Prospective memory Problem Solving: Impaired Problem Solving Impairment: Verbal basic;Verbal complex Executive Function: Decision Making;Self Correcting (during suspected seizure in discharge assessment) Safety/Judgment: Impaired Comments: poor positioning an sequncing noted during transfers Sensation Sensation Light Touch: Appears Intact Hot/Cold: Appears Intact Proprioception: Appears Intact Stereognosis: Appears Intact Coordination Gross Motor Movements are Fluid and Coordinated: No (increased pushing from baseline affecting quality of gait) Fine Motor Movements are Fluid and Coordinated: Yes Motor  Motor Motor: Hemiplegia (L sided) Motor - Discharge Observations: L hemiparesis UE>LE, generalized weakness  Mobility Bed Mobility Bed Mobility: Rolling Left;Left Sidelying to Sit;Sit to Supine Rolling Left: Supervision/Verbal cueing Left Sidelying to Sit: Supervision/Verbal cueing Supine to Sit:  Supervision/Verbal cueing Sit to Supine: Supervision/Verbal cueing Transfers Transfers: Sit to Stand;Stand to Sit;Squat Pivot Transfers Sit to Stand: Supervision/Verbal cueing Stand to Sit: Supervision/Verbal cueing Stand Pivot Transfers: Contact Guard/Touching assist Stand Pivot Transfer Details: Tactile cues for placement;Tactile cues for sequencing;Verbal cues for precautions/safety;Verbal cues for safe use of DME/AE;Verbal cues for technique Transfer (Assistive device): Rolling walker Locomotion  Gait Ambulation: Yes Gait Assistance: Contact Guard/Touching assist Gait Distance (Feet): 150 Feet Assistive device: Rolling walker Gait Assistance Details: Tactile cues for placement;Tactile  cues for sequencing;Verbal cues for safe use of DME/AE;Verbal cues for precautions/safety;Verbal cues for technique Gait Gait: Yes Gait Pattern: Impaired Gait Pattern: Step-through pattern;Decreased step length - left;Decreased step length - right;Decreased stride length;Step-to pattern;Poor foot clearance - left Gait velocity: decreased Stairs / Additional Locomotion Stairs: Yes Stairs Assistance: Minimal Assistance - Patient > 75% Stair Management Technique: Two rails;Step to pattern Number of Stairs: 4 Height of Stairs: 6 Wheelchair Mobility Wheelchair Mobility: No  Trunk/Postural Assessment  Cervical Assessment Cervical Assessment: Exceptions to [facility] (forward head) Thoracic Assessment Thoracic Assessment: Exceptions to [facility] (rounded shoulders) Lumbar Assessment Lumbar Assessment: Exceptions to [facility] (posterior pelvic tilt) Postural Control Postural Control: Within Functional Limits Righting Reactions: improved from eval but continued deficits noted intermittently Postural Limitations: mild kyphosis  Balance Balance Balance Assessed: Yes Standardized Balance Assessment Standardized Balance Assessment:  (Outcome Measures not formally assessed due to seizure epsiode during discharge  assessment) Static Sitting Balance Static Sitting - Balance Support: Feet supported Static Sitting - Level of Assistance: 6: Modified independent (Device/Increase time) Dynamic Sitting Balance Dynamic Sitting - Balance Support: Feet supported Dynamic Sitting - Level of Assistance: 5: Stand by assistance Sitting balance - Comments: close supervision for safety Static Standing Balance Static Standing - Balance Support: Bilateral upper extremity supported Static Standing - Level of Assistance: 5: Stand by assistance Dynamic Standing Balance Dynamic Standing - Balance Support: Bilateral upper extremity supported Dynamic Standing - Level of Assistance: 5: Stand by assistance (CGA) Extremity Assessment  RUE Assessment RUE Assessment: Within Functional Limits Active Range of Motion (AROM) Comments: 3/4 to full shoulder flexion General Strength Comments: 4/5 in shoulder flexion LUE Assessment LUE Assessment: Within Functional Limits Active Range of Motion (AROM) Comments: 4/5 in shoulder flexion General Strength Comments: 4/5 shoulder flexion RLE Assessment RLE Assessment: Exceptions to [facility] RLE Strength Right Hip Flexion: 4-/5 Right Knee Flexion: 4-/5 Right Knee Extension: 4-/5 Right Ankle Dorsiflexion: 4-/5 Right Ankle Plantar Flexion: 4-/5 LLE Assessment LLE Assessment: Exceptions to [facility] LLE Strength Left Hip Flexion: 3+/5 Left Knee Flexion: 4-/5 Left Knee Extension: 4-/5 Left Ankle Dorsiflexion: 4-/5 Left Ankle Plantar Flexion: 4-/5    Alger Simons 06/23/2020, 5:02 PM

## 2020-06-23 NOTE — Plan of Care (Deleted)
During discharge assessment of goals, pt  experienced suspected seizure episode and was unable to perform most transfers, stairs and gait activity at level below most recently progressed baseline.

## 2020-06-23 NOTE — Progress Notes (Signed)
Speech Language Pathology Discharge Summary  Patient Details  Name: Taylor Castaneda MRN: [id number] Date of Birth: 1932/07/18  Patient has met 2 of 4 long term goals.  Patient to discharge at [facility] level.  Reasons goals not met: increased cueing required for problem solving and word finding   Clinical Impression/Discharge Summary:   Pt made functional gains and met 2 out of 4 long term goals this admission. Pt currently requires only Supervision A for comprehension of basic information, however Min A required for most other tasks such as word finding and fluency (due to aphasia) and other basic familiar tasks due to cognitive impairments impacting her emergent awareness, problem solving, and most severely her short term and working memory. As a result she will require 24/7 supervision for greatest safety at discharge. Pt has generally shown improvement in cognitive-linguistic function since admission, however within the last 3 days has experienced some fluctuation in receptive and expressive language as well as new deficits in attention, suspected due to seizure activity, per MD and neurology notes. Given asphasia and cognitive impairments still present and impacting her daily function, recommend pt continue to receive skilled ST services upon discharge. Pt and family education is complete at this time.   Care Partner:  Caregiver Able to Provide Assistance: Yes  Type of Caregiver Assistance: Physical;Cognitive  Recommendation:  Home Health SLP;24 hour supervision/assistance  Rationale for SLP Follow Up: Maximize functional communication;Maximize cognitive function and independence;Reduce caregiver burden   Equipment: none   Reasons for discharge: Discharged from hospital   Patient/Family Agrees with Progress Made and Goals Achieved: Yes    Arbutus Leas 06/23/2020, 12:24 PM

## 2020-06-23 NOTE — Progress Notes (Signed)
[facility] PROGRESS NOTE   Subjective/Complaints: Appreciate neuro note, impression is that aphasia spell most likely seizure related  Neuro has signed off for now but will see in clinic  ROS: Patient denies CP, +SOB when getting OOB, no N/V/D   Objective:   CT Code Stroke CTA Head W/WO contrast  Result Date: 06/21/2020 CLINICAL DATA:  Focal neurological deficit, stroke suspected. Aphasia. EXAM: CT ANGIOGRAPHY HEAD AND NECK CT PERFUSION BRAIN TECHNIQUE: Multidetector CT imaging of the head and neck was performed using the standard protocol during bolus administration of intravenous contrast. Multiplanar CT image reconstructions and MIPs were obtained to evaluate the vascular anatomy. Carotid stenosis measurements (when applicable) are obtained utilizing NASCET criteria, using the distal internal carotid diameter as the denominator. Multiphase CT imaging of the brain was performed following IV bolus contrast injection. Subsequent parametric perfusion maps were calculated using RAPID software. CONTRAST:  124mL OMNIPAQUE IOHEXOL 350 MG/ML SOLN COMPARISON:  None. Head CT June 21, 2020. FINDINGS: CTA NECK FINDINGS Aortic arch: Standard branching. Imaged portion shows no evidence of aneurysm or dissection. Calcified plaques are noted in the aortic arch. No significant stenosis of the major arch vessel origins. Right carotid system: Mild atherosclerotic changes in the right carotid bifurcation. No evidence of dissection, stenosis (50% or greater) or occlusion. Left carotid system: Mild atherosclerotic changes in the left carotid bifurcation. No evidence of dissection, stenosis (50% or greater) or occlusion. Vertebral arteries: Right dominant. No evidence of dissection, stenosis (50% or greater) or occlusion. Skeleton: Degenerative changes of the cervical spine. No aggressive bone lesion identified. Other neck: Negative. Upper chest: Bilateral pleural effusion. Prominent  (11 mm) paratracheal lymph node (series 3, image 157). Review of the MIP images confirms the above findings CTA HEAD FINDINGS Anterior circulation: Calcified plaques in the bilateral carotid siphons without hemodynamically significant stenosis. Mild stenosis of the supraclinoid segment of the left ICA. Early bifurcation of the bilateral M1/MCA segments noted with moderate stenosis at the origin of the left M2/MCA posterior division branch (series 9, image 19). No proximal occlusion, aneurysm, or vascular malformation. Posterior circulation: Patent V4 segment of the bilateral vertebral arteries. The basilar artery has small caliber throughout its course which may be related to the presence of bilateral fetal PCAs. The bilateral posterior cerebral arteries which originate directly from the corresponding ICAs, show mild luminal irregularity likely related to intracranial atherosclerotic disease without hemodynamically significant stenosis. Venous sinuses: As permitted by contrast timing, patent. Anatomic variants: Bilateral fetal PCAs. Review of the MIP images confirms the above findings CT Brain Perfusion Findings: ASPECTS: 10 CBF (<30%) Volume: 17mL Perfusion (Tmax>6.0s) volume: 48mL Mismatch Volume: 22mL Infarction Location:Not applicable. IMPRESSION: 1. No large vessel occlusion. No core infarct or penumbra. 2. Mild atherosclerotic changes in the bilateral carotid bifurcations. No hemodynamically significant stenosis in the neck. 3. Moderate stenosis at the origin of the left M2/MCA posterior division branch. 4. Small caliber basilar artery which may be related to the presence of bilateral fetal PCAs. 5. Bilateral pleural effusion. 6. Prominent paratracheal lymph node, nonspecific. These results were called by telephone at the time of interpretation on 06/21/2020 at 10:59 am to provider [facility]NEILL [facility] , who verbally acknowledged these results. Aortic Atherosclerosis (ICD10-I70.0). Electronically Signed   By:  Pedro Earls M.D.   On: 06/21/2020 10:59   CT Code Stroke CTA Neck W/WO contrast  Result Date: 06/21/2020 CLINICAL DATA:  Focal neurological deficit, stroke suspected. Aphasia. EXAM: CT ANGIOGRAPHY HEAD AND NECK CT PERFUSION BRAIN  TECHNIQUE: Multidetector CT imaging of the head and neck was performed using the standard protocol during bolus administration of intravenous contrast. Multiplanar CT image reconstructions and MIPs were obtained to evaluate the vascular anatomy. Carotid stenosis measurements (when applicable) are obtained utilizing NASCET criteria, using the distal internal carotid diameter as the denominator. Multiphase CT imaging of the brain was performed following IV bolus contrast injection. Subsequent parametric perfusion maps were calculated using RAPID software. CONTRAST:  154mL OMNIPAQUE IOHEXOL 350 MG/ML SOLN COMPARISON:  None. Head CT June 21, 2020. FINDINGS: CTA NECK FINDINGS Aortic arch: Standard branching. Imaged portion shows no evidence of aneurysm or dissection. Calcified plaques are noted in the aortic arch. No significant stenosis of the major arch vessel origins. Right carotid system: Mild atherosclerotic changes in the right carotid bifurcation. No evidence of dissection, stenosis (50% or greater) or occlusion. Left carotid system: Mild atherosclerotic changes in the left carotid bifurcation. No evidence of dissection, stenosis (50% or greater) or occlusion. Vertebral arteries: Right dominant. No evidence of dissection, stenosis (50% or greater) or occlusion. Skeleton: Degenerative changes of the cervical spine. No aggressive bone lesion identified. Other neck: Negative. Upper chest: Bilateral pleural effusion. Prominent (11 mm) paratracheal lymph node (series 3, image 157). Review of the MIP images confirms the above findings CTA HEAD FINDINGS Anterior circulation: Calcified plaques in the bilateral carotid siphons without hemodynamically significant stenosis.  Mild stenosis of the supraclinoid segment of the left ICA. Early bifurcation of the bilateral M1/MCA segments noted with moderate stenosis at the origin of the left M2/MCA posterior division branch (series 9, image 19). No proximal occlusion, aneurysm, or vascular malformation. Posterior circulation: Patent V4 segment of the bilateral vertebral arteries. The basilar artery has small caliber throughout its course which may be related to the presence of bilateral fetal PCAs. The bilateral posterior cerebral arteries which originate directly from the corresponding ICAs, show mild luminal irregularity likely related to intracranial atherosclerotic disease without hemodynamically significant stenosis. Venous sinuses: As permitted by contrast timing, patent. Anatomic variants: Bilateral fetal PCAs. Review of the MIP images confirms the above findings CT Brain Perfusion Findings: ASPECTS: 10 CBF (<30%) Volume: 38mL Perfusion (Tmax>6.0s) volume: 80mL Mismatch Volume: 56mL Infarction Location:Not applicable. IMPRESSION: 1. No large vessel occlusion. No core infarct or penumbra. 2. Mild atherosclerotic changes in the bilateral carotid bifurcations. No hemodynamically significant stenosis in the neck. 3. Moderate stenosis at the origin of the left M2/MCA posterior division branch. 4. Small caliber basilar artery which may be related to the presence of bilateral fetal PCAs. 5. Bilateral pleural effusion. 6. Prominent paratracheal lymph node, nonspecific. These results were called by telephone at the time of interpretation on 06/21/2020 at 10:59 am to provider [facility] , who verbally acknowledged these results. Aortic Atherosclerosis (ICD10-I70.0). Electronically Signed   By: Pedro Earls M.D.   On: 06/21/2020 10:59   MR BRAIN WO CONTRAST  Result Date: 06/21/2020 CLINICAL DATA:  Neuro deficit, acute, stroke suspected. EXAM: MRI HEAD WITHOUT CONTRAST TECHNIQUE: Multiplanar, multiecho pulse sequences of  the brain and surrounding structures were obtained without intravenous contrast. COMPARISON:  Head CT June 21, 2020 FINDINGS: Brain: Several foci of restricted diffusion are seen within the deep white matter of the right frontal parietal region, suggestive watershed infarcts. No hemorrhage or significant mass effect. No hydrocephalus, extra-axial collection or mass lesion. Scattered and confluent foci of T2 hyperintensity are seen within the white of the cerebral hemispheres, nonspecific, most likely related to chronic small vessel ischemia. Moderate parenchymal volume loss.  Vascular: Normal flow voids. Skull and upper cervical spine: Normal marrow signal. Sinuses/Orbits: Bilateral lens surgery. Mild scattered mucosal thickening throughout the paranasal sinuses. IMPRESSION: 1. Several foci of restricted diffusion within the deep white matter of the right frontal parietal region, suggestive of watershed infarcts. 2. Moderate parenchymal volume loss and probable chronic small vessel ischemia. These results were called via AMION paging system at the time of interpretation on 06/21/2020 at 5:09 pm to provider [facility]. Electronically Signed   By: Pedro Earls M.D.   On: 06/21/2020 17:10   CT Code Stroke Cerebral Perfusion with contrast  Result Date: 06/21/2020 CLINICAL DATA:  Focal neurological deficit, stroke suspected. Aphasia. EXAM: CT ANGIOGRAPHY HEAD AND NECK CT PERFUSION BRAIN TECHNIQUE: Multidetector CT imaging of the head and neck was performed using the standard protocol during bolus administration of intravenous contrast. Multiplanar CT image reconstructions and MIPs were obtained to evaluate the vascular anatomy. Carotid stenosis measurements (when applicable) are obtained utilizing NASCET criteria, using the distal internal carotid diameter as the denominator. Multiphase CT imaging of the brain was performed following IV bolus contrast injection. Subsequent parametric  perfusion maps were calculated using RAPID software. CONTRAST:  158mL OMNIPAQUE IOHEXOL 350 MG/ML SOLN COMPARISON:  None. Head CT June 21, 2020. FINDINGS: CTA NECK FINDINGS Aortic arch: Standard branching. Imaged portion shows no evidence of aneurysm or dissection. Calcified plaques are noted in the aortic arch. No significant stenosis of the major arch vessel origins. Right carotid system: Mild atherosclerotic changes in the right carotid bifurcation. No evidence of dissection, stenosis (50% or greater) or occlusion. Left carotid system: Mild atherosclerotic changes in the left carotid bifurcation. No evidence of dissection, stenosis (50% or greater) or occlusion. Vertebral arteries: Right dominant. No evidence of dissection, stenosis (50% or greater) or occlusion. Skeleton: Degenerative changes of the cervical spine. No aggressive bone lesion identified. Other neck: Negative. Upper chest: Bilateral pleural effusion. Prominent (11 mm) paratracheal lymph node (series 3, image 157). Review of the MIP images confirms the above findings CTA HEAD FINDINGS Anterior circulation: Calcified plaques in the bilateral carotid siphons without hemodynamically significant stenosis. Mild stenosis of the supraclinoid segment of the left ICA. Early bifurcation of the bilateral M1/MCA segments noted with moderate stenosis at the origin of the left M2/MCA posterior division branch (series 9, image 19). No proximal occlusion, aneurysm, or vascular malformation. Posterior circulation: Patent V4 segment of the bilateral vertebral arteries. The basilar artery has small caliber throughout its course which may be related to the presence of bilateral fetal PCAs. The bilateral posterior cerebral arteries which originate directly from the corresponding ICAs, show mild luminal irregularity likely related to intracranial atherosclerotic disease without hemodynamically significant stenosis. Venous sinuses: As permitted by contrast timing,  patent. Anatomic variants: Bilateral fetal PCAs. Review of the MIP images confirms the above findings CT Brain Perfusion Findings: ASPECTS: 10 CBF (<30%) Volume: 63mL Perfusion (Tmax>6.0s) volume: 37mL Mismatch Volume: 54mL Infarction Location:Not applicable. IMPRESSION: 1. No large vessel occlusion. No core infarct or penumbra. 2. Mild atherosclerotic changes in the bilateral carotid bifurcations. No hemodynamically significant stenosis in the neck. 3. Moderate stenosis at the origin of the left M2/MCA posterior division branch. 4. Small caliber basilar artery which may be related to the presence of bilateral fetal PCAs. 5. Bilateral pleural effusion. 6. Prominent paratracheal lymph node, nonspecific. These results were called by telephone at the time of interpretation on 06/21/2020 at 10:59 am to provider [facility] , who verbally acknowledged these results. Aortic Atherosclerosis (ICD10-I70.0). Electronically  Signed   By: Pedro Earls M.D.   On: 06/21/2020 10:59   EEG adult  Result Date: 06/21/2020 Lora Havens, MD     06/21/2020  4:08 PM Patient Name: Taylor Castaneda MRN: [id number] Epilepsy Attending: Lora Havens Referring Physician/Provider: Clance Boll, NP Date: 06/21/2020 Duration: 24.37 mins Patient history: 87 y.o. female with a history of multiple recurrent spells of uncertain etiology.  EEG to evaluate for seizure Level of alertness: Awake, asleep AEDs during EEG study: LEV Technical aspects: This EEG study was done with scalp electrodes positioned according to the 10-20 International system of electrode placement. Electrical activity was acquired at a sampling rate of 500Hz  and reviewed with a high frequency filter of 70Hz  and a low frequency filter of 1Hz . EEG data were recorded continuously and digitally stored. Description: The posterior dominant rhythm consists of 7.5-8 Hz activity of moderate voltage (25-35 uV) seen predominantly in posterior head regions,  symmetric and reactive to eye opening and eye closing. Sleep was characterized by vertex waves, sleep spindles (12 to 14 Hz), maximal frontocentral region. EEG showed intermittent generalized 3 to 6 Hz theta-delta slowing. Physiologic photic driving was not seen during photic stimulation. Hyperventilation was not performed.   ABNORMALITY -Intermittent slow, generalized IMPRESSION: This study is suggestive of mild diffuse encephalopathy, nonspecific etiology. No seizures or epileptiform discharges were seen throughout the recording. Lora Havens   CT HEAD CODE STROKE WO CONTRAST  Result Date: 06/21/2020 CLINICAL DATA:  Code stroke.  Acute neuro deficit.  Aphasia EXAM: CT HEAD WITHOUT CONTRAST TECHNIQUE: Contiguous axial images were obtained from the base of the skull through the vertex without intravenous contrast. COMPARISON:  CT head 05/29/2020 FINDINGS: Brain: Moderate atrophy. Patchy white matter hypodensity bilaterally stable. Chronic infarct chronic lacunar infarction in the right internal capsule unchanged. Negative for acute infarct, hemorrhage, mass. Vascular: Negative for hyperdense vessel. Skull: Negative Sinuses/Orbits: Paranasal sinuses clear. Bilateral cataract extraction. Other: None ASPECTS (Brinson Stroke Program Early CT Score) - Ganglionic level infarction (caudate, lentiform nuclei, internal capsule, insula, M1-M3 cortex): 7 - Supraganglionic infarction (M4-M6 cortex): 3 Total score (0-10 with 10 being normal): 10 IMPRESSION: 1. No acute abnormality and no change from the recent study 2. Atrophy and chronic microvascular ischemia, stable 3. ASPECTS is 10 4. Code stroke imaging results were communicated on 06/21/2020 at 10:09 am to provider Leonel Ramsay via text page Electronically Signed   By: Franchot Gallo M.D.   On: 06/21/2020 10:10   No results for input(s): WBC, HGB, HCT, PLT in the last 72 hours. Recent Labs    06/22/20 0711  NA 137  K 3.3*  CL 98  CO2 29  GLUCOSE 104*  BUN  28*  CREATININE 1.68*  CALCIUM 8.1*    Intake/Output Summary (Last 24 hours) at 06/23/2020 0810 Last data filed at 06/22/2020 1832 Gross per 24 hour  Intake 460 ml  Output -  Net 460 ml     Pressure Injury 06/10/20 Coccyx Medial Stage 1 -  Intact skin with non-blanchable redness of a localized area usually over a bony prominence. (Active)  06/10/20 2200  Location: Coccyx  Location Orientation: Medial  Staging: Stage 1 -  Intact skin with non-blanchable redness of a localized area usually over a bony prominence.  Wound Description (Comments):   Present on Admission: Yes    Physical Exam: Vital Signs Blood pressure 138/77, pulse 67, temperature 98.2 F (36.8 C), temperature source Oral, resp. rate 18, height 5' (1.524 m), weight 56.9  kg, SpO2 98 %.  General: No acute distress Mood and affect are appropriate Heart: Regular rate and rhythm no rubs murmurs or extra sounds Lungs: Clear to auscultation, breathing unlabored, no rales or wheezes Abdomen: Positive bowel sounds, soft nontender to palpation, nondistended Extremities: No clubbing, cyanosis, or edema Skin: No evidence of breakdown, no evidence of rash   Neuro: oriented x3, language more fluent by the day. Reasonable insight and awareness. RUE 4+/5 RLE 4+/5. LUE and LLE 4/5. No focal sensory deficits appreciated      Assessment/Plan: 1. Functional deficits which require 3+ hours per day of interdisciplinary therapy in a comprehensive inpatient rehab setting.  Physiatrist is providing close team supervision and 24 hour management of active medical problems listed below.  Physiatrist and rehab team continue to assess barriers to discharge/monitor patient progress toward functional and medical goals  Care Tool:  Bathing    Body parts bathed by patient: Right arm,Left arm,Chest,Abdomen (UB only today)   Body parts bathed by helper: Buttocks,Right lower leg,Left lower leg     Bathing assist Assist Level:  Supervision/Verbal cueing     Upper Body Dressing/Undressing Upper body dressing Upper body dressing/undressing activity did not occur (including orthotics): Safety/medical concerns What is the patient wearing?: Pull over shirt    Upper body assist Assist Level: Minimal Assistance - Patient > 75%    Lower Body Dressing/Undressing Lower body dressing      What is the patient wearing?: Pants     Lower body assist Assist for lower body dressing: Minimal Assistance - Patient > 75%     Toileting Toileting    Toileting assist Assist for toileting: Moderate Assistance - Patient 50 - 74%     Transfers Chair/bed transfer  Transfers assist     Chair/bed transfer assist level: Moderate Assistance - Patient 50 - 74%     Locomotion Ambulation   Ambulation assist      Assist level: Contact Guard/Touching assist Assistive device: Walker-rolling Max distance: 157ft   Walk 10 feet activity   Assist  Walk 10 feet activity did not occur:  (knee pain and s.o.b)  Assist level: Contact Guard/Touching assist Assistive device: Walker-rolling   Walk 50 feet activity   Assist Walk 50 feet with 2 turns activity did not occur: Safety/medical concerns  Assist level: Contact Guard/Touching assist Assistive device: Walker-rolling    Walk 150 feet activity   Assist Walk 150 feet activity did not occur: Safety/medical concerns         Walk 10 feet on uneven surface  activity   Assist Walk 10 feet on uneven surfaces activity did not occur: Safety/medical concerns         Wheelchair     Assist Will patient use wheelchair at discharge?: No             Wheelchair 50 feet with 2 turns activity    Assist            Wheelchair 150 feet activity     Assist          Blood pressure 138/77, pulse 67, temperature 98.2 F (36.8 C), temperature source Oral, resp. rate 18, height 5' (1.524 m), weight 56.9 kg, SpO2 98 %.  Medical Problem List  and Plan: 1.Decreased functional ability with aphasiasecondary to several punctate right centrum semiovale infarct likely due to severe intracranial stenosis -patient may Shower  -ELOS/Goals: 1/21 stable for d/c after am BMET reviewed  2. Antithrombotics: -DVT/anticoagulation:Subcutaneous heparin switched to lovenox- will hold givne  severe anemia  -antiplatelet therapy: Aspirin 325 mg daily and Plavix 75 mg daily x3 months then Plavix alone 3. Pain Management:D/c Tramadol since she denies pain and has AKI. Chronic sacral fx asymptomatic  4. Mood:Provide emotional support -antipsychotic agents: N/A 5. Neuropsych: This patientiscapable of making decisions on herown behalf. 6. Skin/Wound Care:Routine skin checks 7. Fluids/Electrolytes/Nutrition:Routine in and outs with follow-up chemistries BMET in am  8. Cholelithiasis/choledocholithiasis. Status post ERCP followed by laparoscopic cholecystectomy 06/01/2020 -pt still denies pain 9. Seizure disorder. Keppra 500 mg twice daily. EEG negative 10. Hypertension. Altace 5 mg daily, Coreg 12.5 mg twice daily.  Vitals:   06/23/20 0320 06/23/20 0751  BP: (!) 156/62 138/77  Pulse: 72 67  Resp: 18   Temp: 98.2 F (36.8 C)   SpO2: 98%   change furosemide to qam, 10mg  11. Diabetes mellitus. Hemoglobin A1c 8.7. Lantus insulin 8 units nightly---pt takes 16u at bedtime at home    CBG (last 3)  Recent Labs    06/22/20 1624 06/22/20 2100 06/23/20 0558  GLUCAP 179* 286* 103*  am CBGs improved with hs snack  12. Hyperlipidemia. Lipitor 13. History of colon cancer with resection 1998. Follow-up as outpatient  14.  Leukocytosis no fever monitor for now   1/16: WBC down to 8.8, routine CBC ordered for tomorrow  15.  DOE- likely deconditioning plus anemia, will monitor in therapy , check CXR to look for pulm edema pulm HTN , off lasix 16,  ABLA- appreciate GI note  no clear cut signs of GIB- a GI suggested possible retroperitoneal bleed- no evidence of bleed      1/16 Hgb 8.4, stable- f/u with PCP as OP  LOS: 13 days A FACE TO FACE EVALUATION WAS PERFORMED  Charlett Blake 06/23/2020, 8:10 AM

## 2020-06-23 NOTE — Progress Notes (Signed)
Physical Therapy Session Note  Patient Details  Name: Taylor Castaneda MRN: [id number] Date of Birth: 1932-07-25  Today's Date: 06/23/2020 PT Individual Time: 0830-0945 PT Individual Time Calculation (min): 75 min   Short Term Goals: Week 2:  PT Short Term Goal 1 (Week 2): STG=LTG due to ELOS Week 3:     Skilled Therapeutic Interventions/Progress Updates:    PT instructed pt in Grad day assessment to measure progress toward goals. See Discharge Summary for details.  Patient supine in bed upon PT arrival. Patient alert and agreeable to PT session. Patient denied pain throughout session.  During session, about midway through, pt began to demonstrate decreased attention, increased expressive and receptive aphasia, decreased motor control/ planning, and coordinated movements during discharge assessment. Symptoms this day appear similar to symptoms displayed 2 days prior when pt is believed to have experienced a seizure.  Assessment halted, pt brought back to room and BP taken in seated position at 116/ 49 with pulse at 66bpm. Assisted pt to bed with Min A and pt able to bring self to supine. BP in supine at 107/ 57 with pulse rate at 74bpm initially and taken again 5-10min later at 121/64 and pulse at 64bpm. Within 5 minutes from recognized regression, pt was returned to supine, and after final BP reading, pt's aphasia greatly improves and she is able to deliberately and clearly answer questions demonstrating return to initial state. Charge nurse and PA notified as to pt's symptoms and vitals. PA provided f/u with pt and son who is present. PA later reports that pt's seizure medication has been increased and pt's EEG activity will be monitored with OP visits. Pt set to d/c home with family.    Therapeutic Activity: Bed Mobility: assessed and pt is able to perform roll as well as supine <> sit transition with no cues at supervision level. Pt does require BUE support at EOB or bed rail in order to  complete.  Transfers: Patient performed STS and SPVT transfers with supervision initially. Continued vc req'd for safe hand progression for good positioning to sit. Toilet transfer with pt positioning herself in front of toilet but far from seat and req's vc and Min A for improved positioning. Pericare performed by this therapist for time and pt confusion.   Gait Training:  Gait training performed at start of session. Patient ambulated short/ household distances in room as well as 150 feet with RW and CGA. Pt demos mild pushing to L side and tends to crowd the L side of the walker with intermittent kicking of rear walker leg with toe during step advancement. Vc/ tc provided for centering self in walker and pushing R hand out during ambulation with minimal correction made by pt. Pt does not request to sit but does demonstrate mild fatigue with facial expression after reaching 150 feet.   Patient supine in bed at end of session with bed alarm set, and all needs within reach. Son present in room.   Therapy Documentation Precautions:  Precautions Precautions: Fall,Other (comment) Precaution Comments: monitor vitals with activity, hx of seizures Restrictions Weight Bearing Restrictions: No   Therapy/Group: Individual Therapy  Alger Simons 06/23/2020, 11:31 AM

## 2020-06-24 ENCOUNTER — Telehealth: Payer: Self-pay

## 2020-06-24 LAB — BASIC METABOLIC PANEL
Anion gap: 10 (ref 5–15)
BUN: 39 mg/dL — ABNORMAL HIGH (ref 8–23)
CO2: 27 mmol/L (ref 22–32)
Calcium: 8.1 mg/dL — ABNORMAL LOW (ref 8.9–10.3)
Chloride: 99 mmol/L (ref 98–111)
Creatinine, Ser: 1.62 mg/dL — ABNORMAL HIGH (ref 0.44–1.00)
GFR, Estimated: 31 mL/min — ABNORMAL LOW (ref >60)
Glucose, Bld: 145 mg/dL — ABNORMAL HIGH (ref 70–99)
Potassium: 3.8 mmol/L (ref 3.5–5.1)
Sodium: 136 mmol/L (ref 135–145)

## 2020-06-24 LAB — GLUCOSE, CAPILLARY: Glucose-Capillary: 140 mg/dL — ABNORMAL HIGH (ref 70–99)

## 2020-06-24 MED ORDER — INSULIN GLARGINE 100 UNIT/ML ~~LOC~~ SOLN
8.0000 [IU] | Freq: Every day | SUBCUTANEOUS | Status: DC
  Filled 2020-06-24: qty 0.08

## 2020-06-24 MED ORDER — INSULIN GLARGINE 100 UNIT/ML SOLOSTAR PEN: 8.0000 [IU] | PEN_INJECTOR | Freq: Every day | SUBCUTANEOUS | 11 refills | Status: DC

## 2020-06-24 NOTE — Chronic Care Management (AMB) (Addendum)
Chronic Care Management Pharmacy Assistant   Name: Taylor Castaneda  MRN: [id number] DOB: 04/20/1933  Reason for Encounter: Disease State- Hypertension  Patient Questions:  1.  Have you seen any other providers since your last visit? Yes 06/10/20 ED visit- Subcortical infarction  05/29/20- ED visit- Cholelithiasis 04/13/20- Dr. Jacelyn Pi- Internal medicine 02/19/20- Dr. [name]- PCP   2.  Any changes in your medicines or health? Yes 06/10/20- Stroke   PCP : [name], MD  Allergies:   Allergies  Allergen Reactions   Gabapentin Other (See Comments)    Dizziness   Pravastatin Sodium Other (See Comments)    "Anemia," per Dr Gilford Rile, [facility]    Medications: Outpatient Encounter Medications as of 06/24/2020  Medication Sig   acetaminophen (TYLENOL) 325 MG tablet Take 325-650 mg by mouth every 6 (six) hours as needed (for pain).   aspirin EC 325 MG EC tablet Take 1 tablet (325 mg total) by mouth daily.   atorvastatin (LIPITOR) 40 MG tablet Take 1 tablet (40 mg total) by mouth daily at 6 PM.   Carboxymethylcellul-Glycerin 1-0.25 % SOLN Place 1-2 drops into both eyes 3 (three) times daily as needed (for dry/irritated eyes.).   carvedilol (COREG) 12.5 MG tablet Take 1 tablet (12.5 mg total) by mouth 2 (two) times daily.   Cholecalciferol (VITAMIN D3) 25 MCG (1000 UT) CAPS Take 1 capsule (1,000 Units total) by mouth daily.   Cinnamon 500 MG capsule Take 1,000 mg by mouth daily.   clopidogrel (PLAVIX) 75 MG tablet Take 1 tablet (75 mg total) by mouth daily.   colchicine 0.6 MG tablet Take 1 tablet (0.6 mg total) by mouth daily as needed (gout flare). First day of gout flare, may take 1 tablet twice daily. (Patient taking differently: Take 0.6 mg by mouth daily as needed (for gout flares, and may take 0.6 mg TWICE DAILY on 1st day of flare).)   diclofenac Sodium (VOLTAREN) 1 % GEL Apply 2 g topically 4 (four) times daily.   Ferrous Sulfate (IRON) 325 (65 Fe) MG TABS  Take 1 tablet (325 mg total) by mouth See admin instructions. Take 325 mg by mouth in the evening on Mondays and Thursdays only   insulin glargine (LANTUS) 100 UNIT/ML Solostar Pen Inject 8 Units into the skin daily.   levETIRAcetam (KEPPRA) 750 MG tablet Take 1 tablet (750 mg total) by mouth 2 (two) times daily.   Multiple Vitamins-Minerals (PRESERVISION AREDS 2 PO) Take 1 capsule by mouth 2 (two) times daily.   pantoprazole (PROTONIX) 40 MG tablet Take 1 tablet (40 mg total) by mouth 2 (two) times daily.   Facility-Administered Encounter Medications as of 06/24/2020  Medication   acetaminophen (TYLENOL) tablet 1,000 mg   albuterol (PROVENTIL) (2.5 MG/3ML) 0.083% nebulizer solution 2.5 mg   aspirin EC tablet 325 mg   atorvastatin (LIPITOR) tablet 40 mg   carvedilol (COREG) tablet 12.5 mg   clopidogrel (PLAVIX) tablet 75 mg   diclofenac Sodium (VOLTAREN) 1 % topical gel 2 g   feeding supplement (ENSURE ENLIVE / ENSURE PLUS) liquid 237 mL   insulin aspart (novoLOG) injection 0-9 Units   insulin glargine (LANTUS) injection 8 Units   levETIRAcetam (KEPPRA) tablet 750 mg   ondansetron (ZOFRAN) tablet 4 mg   Or   ondansetron (ZOFRAN) injection 4 mg   pantoprazole (PROTONIX) EC tablet 40 mg   polyvinyl alcohol (LIQUIFILM TEARS) 1.4 % ophthalmic solution 1 drop    Current Diagnosis: Patient Active Problem List  Diagnosis Date Noted   Melena    Pressure injury of skin 06/12/2020   Cerebral thrombosis with cerebral infarction 06/03/2020   Common bile duct (CBD) obstruction    Choledocholithiasis 05/30/2020   Cholelithiasis 05/30/2020   Hyperbilirubinemia 05/30/2020   Normocytic anemia 05/30/2020   Transaminitis 05/30/2020   Pancreatic duct dilated 05/30/2020   Dry age-related macular degeneration 05/06/2020   Secondary hyperparathyroidism of renal origin ([place]) 02/20/2020   Anhedonia 02/20/2020   Seizure ([place]) 03/18/2019   Transient atrial fibrillation ([place]) 03/18/2019   MCI (mild  cognitive impairment) 03/18/2019   History of gout 04/29/2017   OA (osteoarthritis) of knee 12/17/2016   Subcortical infarction ([place]) 06/13/2016   Health maintenance examination 01/30/2016   Medicare annual wellness visit, subsequent 03/10/2014   Advanced care planning/counseling discussion 03/10/2014   PAD (peripheral artery disease) ([place]) 11/11/2013   Dyspnea 09/18/2011   PMR (polymyalgia rheumatica) ([place]) 08/27/2011   Controlled type 2 diabetes mellitus with diabetic nephropathy, with long-term current use of insulin ([place]) 08/04/2009   Essential hypertension 06/13/2009   CKD (chronic kidney disease), stage III ([place]) 01/24/2009   Primary angle-closure glaucoma 11/13/2007   Osteopenia 11/13/2007   History of colon cancer 11/13/2007   Hyperlipidemia associated with type 2 diabetes mellitus ([place]) 10/23/2006   Reviewed chart prior to disease state call. Spoke with patient regarding BP  Recent Office Vitals: BP Readings from Last 3 Encounters:  06/24/20 (!) 158/52  06/10/20 (!) 133/56  05/30/20 (!) 126/58   Pulse Readings from Last 3 Encounters:  06/24/20 76  06/10/20 70  05/30/20 76    Wt Readings from Last 3 Encounters:  06/15/20 125 lb 8 oz (56.9 kg)  06/01/20 112 lb (50.8 kg)  05/29/20 112 lb (50.8 kg)     Kidney Function Lab Results  Component Value Date/Time   CREATININE 1.62 (H) 06/24/2020 05:34 AM   CREATININE 1.68 (H) 06/22/2020 07:11 AM   GFR 35.39 (L) 02/12/2020 07:59 AM   GFRNONAA 31 (L) 06/24/2020 05:34 AM   GFRAA 46 (L) 03/06/2019 03:12 AM    BMP Latest Ref Rng & Units 06/24/2020 06/22/2020 06/20/2020  Glucose 70 - 99 mg/dL 145(H) 104(H) 39(LL)  BUN 8 - 23 mg/dL 39(H) 28(H) 22  Creatinine 0.44 - 1.00 mg/dL 1.62(H) 1.68(H) 1.41(H)  Sodium 135 - 145 mmol/L 136 137 140  Potassium 3.5 - 5.1 mmol/L 3.8 3.3(L) 3.7  Chloride 98 - 111 mmol/L 99 98 101  CO2 22 - 32 mmol/L 27 29 27   Calcium 8.9 - 10.3 mg/dL 8.1(L) 8.1(L) 8.3(L)   Patient recently admitted to ED  06/10/20 for gallstones and subsequently removal of her gallbladder. A few days after surgery her daughter notes she suffered a stroke. She is set up for home PT, OT and speech therapy- home health will be coming out Friday 07/01/20.  Current antihypertensive regimen:  Carvedilol 12.5 mg - 1 BID   How often are you checking your Blood Pressure? daily - multiple times a day since coming home from hospital  Current home BP readings starting from 06/25/20 AM:  113/56 58 175/81, 57 153/76, 72 144/75, 68 146/71, 62  Lunch: 153/77, 66 150/74, 62 146/74, 76 149/74, 73  Dinner: 166/70, 70 178/79, 68 173/76, 64  What recent interventions/DTPs have been made by any provider to improve Blood Pressure control since last CPP Visit: Encouraged to check BP daily and limit salt intake <2300 mg/day  Any recent hospitalizations or ED visits since last visit with CPP? Yes,  06/10/20 ED visit-  Subcortical infarction   05/29/20- ED visit- Cholelithiasis  What diet changes have been made to improve Blood Pressure Control?  Son states that she does not have much of an appetite. She usually eats 1 pop tart or Consulting civil engineer biscuit for breakfast. Son notes he has been trying to get her to drink the Atkins protein shakes at some point during the day.  What exercise is being done to improve your Blood Pressure Control?  Patient has been resting since coming home from hospital 06/24/20. Fairly sedentary prior to hospitalization.   Adherence Review: Is the patient currently on ACE/ARB medication? No Does the patient have >5 day gap between last estimated fill dates? CPP to review.  Patient's son was also able to provide blood sugar readings starting on 06/25/20 F[place]: 85 146 99 116 115 Before Lunch: 255 191 146 198 Before Dinner: 214 211 189 180  Follow-Up:  Pharmacist Review   [name], CPP notified  Margaretmary Dys, [place] [phone]  I have reviewed  the care management and care coordination activities outlined in this encounter and I am certifying that I agree with the content of this note.   Reviewed discharge notes 06/10/20 - multiple med changes include:  Discontinued: - Furosemide - Potassium  - Ramipril  - Humalog  Decrease -Lantus reduced to 8 units qhs   Increase: -Aspirin 81 mg to 325 mg daily  -Keppra 250 mg BID to 500 mg BID  Start: -Plavix 75 mg day x3 months total then Plavix alone afterwards (stop aspirin at this time) -Protonix 40 mg BID  Will call patient 2/1 for medication rec, diabetes, and BP log review, transition of care. She has appt with pain medicine/rehab and neurology scheduled but no follow up on schedule with PCP yet.  [name], PharmD Clinical Pharmacist [facility] at [facility] [phone]

## 2020-06-24 NOTE — Progress Notes (Addendum)
[place] PHYSICAL MEDICINE & REHABILITATION PROGRESS NOTE   Subjective/Complaints: Labile CBG, had elevation of >300 after cookie and received insulin, discussed no concentrated sweets  ROS: Patient denies CP, +SOB when getting OOB, no N/V/D   Objective:   No results found. No results for input(s): WBC, HGB, HCT, PLT in the last 72 hours. Recent Labs    06/22/20 0711 06/24/20 0534  NA 137 136  K 3.3* 3.8  CL 98 99  CO2 29 27  GLUCOSE 104* 145*  BUN 28* 39*  CREATININE 1.68* 1.62*  CALCIUM 8.1* 8.1*    Intake/Output Summary (Last 24 hours) at 06/24/2020 9381 Last data filed at 06/23/2020 1850 Gross per 24 hour  Intake 716 ml  Output --  Net 716 ml     Pressure Injury 06/10/20 Coccyx Medial Stage 1 -  Intact skin with non-blanchable redness of a localized area usually over a bony prominence. (Active)  06/10/20 2200  Location: Coccyx  Location Orientation: Medial  Staging: Stage 1 -  Intact skin with non-blanchable redness of a localized area usually over a bony prominence.  Wound Description (Comments):   Present on Admission: Yes    Physical Exam: Vital Signs Blood pressure (!) 158/52, pulse 76, temperature 98.7 F (37.1 C), temperature source Oral, resp. rate 18, height 5' (1.524 m), weight 56.9 kg, SpO2 97 %.  General: No acute distress Mood and affect are appropriate Heart: Regular rate and rhythm no rubs murmurs or extra sounds Lungs: Clear to auscultation, breathing unlabored, no rales or wheezes Abdomen: Positive bowel sounds, soft nontender to palpation, nondistended Extremities: No clubbing, cyanosis, or edema Skin: No evidence of breakdown, no evidence of rash   Neuro: oriented x3, language more fluent by the day. Reasonable insight and awareness. RUE 4+/5 RLE 4+/5. LUE and LLE 4/5. No focal sensory deficits appreciated      Assessment/Plan: 1. Functional deficits following Left MCA infarct Stable for D/C today F/u PCP in 3-4 weeks F/u  GI F/u neuro F/u PM&R 2 weeks See D/C summary See D/C instructions Care Tool:  Bathing    Body parts bathed by patient: Right arm,Left arm,Chest,Abdomen (UB only today)   Body parts bathed by helper: Buttocks,Right lower leg,Left lower leg     Bathing assist Assist Level: Minimal Assistance - Patient > 75%     Upper Body Dressing/Undressing Upper body dressing Upper body dressing/undressing activity did not occur (including orthotics): Safety/medical concerns What is the patient wearing?: Pull over shirt    Upper body assist Assist Level: Supervision/Verbal cueing    Lower Body Dressing/Undressing Lower body dressing      What is the patient wearing?: Pants     Lower body assist Assist for lower body dressing: Minimal Assistance - Patient > 75%     Toileting Toileting    Toileting assist Assist for toileting: Minimal Assistance - Patient > 75%     Transfers Chair/bed transfer  Transfers assist     Chair/bed transfer assist level: Minimal Assistance - Patient > 75%     Locomotion Ambulation   Ambulation assist      Assist level: Contact Guard/Touching assist Assistive device: Walker-rolling Max distance: 169ft   Walk 10 feet activity   Assist  Walk 10 feet activity did not occur:  (knee pain and s.o.b)  Assist level: Contact Guard/Touching assist Assistive device: Walker-rolling   Walk 50 feet activity   Assist Walk 50 feet with 2 turns activity did not occur: Safety/medical concerns  Assist level: Contact  Guard/Touching assist Assistive device: Walker-rolling    Walk 150 feet activity   Assist Walk 150 feet activity did not occur: Safety/medical concerns  Assist level: Contact Guard/Touching assist Assistive device: Walker-rolling    Walk 10 feet on uneven surface  activity   Assist Walk 10 feet on uneven surfaces activity did not occur: Safety/medical concerns         Wheelchair     Assist Will patient use  wheelchair at discharge?: Yes Type of Wheelchair: Manual Wheelchair activity did not occur: Safety/medical concerns         Wheelchair 50 feet with 2 turns activity    Assist    Wheelchair 50 feet with 2 turns activity did not occur: Safety/medical concerns       Wheelchair 150 feet activity     Assist  Wheelchair 150 feet activity did not occur: Safety/medical concerns       Blood pressure (!) 158/52, pulse 76, temperature 98.7 F (37.1 C), temperature source Oral, resp. rate 18, height 5' (1.524 m), weight 56.9 kg, SpO2 97 %.  Medical Problem List and Plan: 1.Decreased functional ability with aphasiasecondary to several punctate right centrum semiovale infarct likely due to severe intracranial stenosis -patient may Shower  -ELOS/Goals: 1/21 stable for d/c after am BMET reviewed  2. Antithrombotics: -DVT/anticoagulation:Subcutaneous heparin switched to lovenox- will hold givne severe anemia  -antiplatelet therapy: Aspirin 325 mg daily and Plavix 75 mg daily x3 months then Plavix alone 3. Pain Management:D/c Tramadol since she denies pain and has AKI. Chronic sacral fx asymptomatic  4. Mood:Provide emotional support -antipsychotic agents: N/A 5. Neuropsych: This patientiscapable of making decisions on herown behalf. 6. Skin/Wound Care:Routine skin checks 7. Fluids/Electrolytes/Nutrition:Routine in and outs with follow-up chemistries BMET shows elevated BUN will d/c lasix 8. Cholelithiasis/choledocholithiasis. Status post ERCP followed by laparoscopic cholecystectomy 06/01/2020 -pt still denies pain 9. Seizure disorder. Keppra 500 mg twice daily. EEG negative 10. Hypertension. Altace 5 mg daily, Coreg 12.5 mg twice daily.  Vitals:   06/23/20 2133 06/24/20 0557  BP: 136/67 (!) 158/52  Pulse: 66 76  Resp:  18  Temp:  98.7 F (37.1 C)  SpO2:  97%   11. Diabetes mellitus. Reduce  Lantus  insulinto 8 units nightly---pt takes 16u at bedtime at home    CBG (last 3)  Recent Labs    06/23/20 2120 06/23/20 2146 06/24/20 0607  GLUCAP 57* 79 140*  low CBG after eating cookie and receiving novalog 12. Hyperlipidemia. Lipitor 13. History of colon cancer with resection 1998. Follow-up as outpatient  14.  Leukocytosis no fever monitor for now   1/16: WBC down to 8.8, routine CBC ordered for tomorrow  15.  DOE- likely deconditioning plus anemia, will monitor in therapy , check CXR to look for pulm edema pulm HTN , off lasix 16,  ABLA- appreciate GI note no clear cut signs of GIB- a GI suggested possible retroperitoneal bleed- no evidence of bleed      1/16 Hgb 8.4, stable- f/u with PCP as OP  LOS: 14 days A FACE TO FACE EVALUATION WAS PERFORMED  Charlett Blake 06/24/2020, 8:12 AM

## 2020-06-26 NOTE — Progress Notes (Signed)
Patient discharged with family to home. Family stated that they understood all discharge instructions.

## 2020-06-26 NOTE — Plan of Care (Signed)
  Problem: Consults Goal: RH STROKE PATIENT EDUCATION Description: See Patient Education module for education specifics  Outcome: Completed/Met   Problem: RH SKIN INTEGRITY Goal: RH STG SKIN FREE OF INFECTION/BREAKDOWN Description: No development of infection/skin breakdown while in Rehab with min assist Outcome: Completed/Met   Problem: RH COGNITION-NURSING Goal: RH STG USES MEMORY AIDS/STRATEGIES W/ASSIST TO PROBLEM SOLVE Description: STG Uses Memory Aids/Strategies With min assistance to Problem Solve. Outcome: Completed/Met   Problem: Consults Goal: RH STROKE PATIENT EDUCATION Description: See Patient Education module for education specifics  Outcome: Completed/Met   Problem: RH BOWEL ELIMINATION Goal: RH STG MANAGE BOWEL WITH ASSISTANCE Description: STG Manage Bowel with mod I Assistance. Outcome: Completed/Met   Problem: RH SKIN INTEGRITY Goal: RH STG ABLE TO PERFORM INCISION/WOUND CARE W/ASSISTANCE Description: STG Able To Perform Incision/Wound Care With min  Assistance. Outcome: Completed/Met   Problem: RH SAFETY Goal: RH STG ADHERE TO SAFETY PRECAUTIONS W/ASSISTANCE/DEVICE Description: STG Adhere to Safety Precautions With  min Assistance/Device. Outcome: Completed/Met   Problem: RH PAIN MANAGEMENT Goal: RH STG PAIN MANAGED AT OR BELOW PT'S PAIN GOAL Description: At or below level 4 Outcome: Completed/Met   Problem: RH KNOWLEDGE DEFICIT Goal: RH STG INCREASE KNOWLEDGE OF DIABETES Description: Patient and family will be able to manage DM with medications and dietary modifications using handouts and educational materials independently Outcome: Completed/Met Goal: RH STG INCREASE KNOWLEDGE OF HYPERTENSION Description: Patient and family will be able to manage HTN with medications and dietary modifications using handouts and educational materials independently Outcome: Completed/Met Goal: RH STG INCREASE KNOWLEGDE OF HYPERLIPIDEMIA Description: Patient and family  will be able to manage HLD with medications and dietary modifications using handouts and educational materials independently Outcome: Completed/Met Goal: RH STG INCREASE KNOWLEDGE OF STROKE PROPHYLAXIS Description: Patient and family will be able to manage secondary stroke risks/prophylaxis with medications and dietary modifications using handouts and educational materials independently Outcome: Completed/Met

## 2020-07-01 DIAGNOSIS — M47812 Spondylosis without myelopathy or radiculopathy, cervical region: Secondary | ICD-10-CM | POA: Diagnosis not present

## 2020-07-01 DIAGNOSIS — M8580 Other specified disorders of bone density and structure, unspecified site: Secondary | ICD-10-CM | POA: Diagnosis not present

## 2020-07-01 DIAGNOSIS — Z9181 History of falling: Secondary | ICD-10-CM | POA: Diagnosis not present

## 2020-07-01 DIAGNOSIS — I10 Essential (primary) hypertension: Secondary | ICD-10-CM | POA: Diagnosis not present

## 2020-07-01 DIAGNOSIS — Z7982 Long term (current) use of aspirin: Secondary | ICD-10-CM | POA: Diagnosis not present

## 2020-07-01 DIAGNOSIS — E785 Hyperlipidemia, unspecified: Secondary | ICD-10-CM | POA: Diagnosis not present

## 2020-07-01 DIAGNOSIS — G40909 Epilepsy, unspecified, not intractable, without status epilepticus: Secondary | ICD-10-CM | POA: Diagnosis not present

## 2020-07-01 DIAGNOSIS — I700 Atherosclerosis of aorta: Secondary | ICD-10-CM | POA: Diagnosis not present

## 2020-07-01 DIAGNOSIS — Z85038 Personal history of other malignant neoplasm of large intestine: Secondary | ICD-10-CM | POA: Diagnosis not present

## 2020-07-01 DIAGNOSIS — E1139 Type 2 diabetes mellitus with other diabetic ophthalmic complication: Secondary | ICD-10-CM | POA: Diagnosis not present

## 2020-07-01 DIAGNOSIS — I69320 Aphasia following cerebral infarction: Secondary | ICD-10-CM | POA: Diagnosis not present

## 2020-07-01 DIAGNOSIS — H409 Unspecified glaucoma: Secondary | ICD-10-CM | POA: Diagnosis not present

## 2020-07-01 DIAGNOSIS — Z7902 Long term (current) use of antithrombotics/antiplatelets: Secondary | ICD-10-CM | POA: Diagnosis not present

## 2020-07-01 DIAGNOSIS — D649 Anemia, unspecified: Secondary | ICD-10-CM | POA: Diagnosis not present

## 2020-07-04 ENCOUNTER — Telehealth: Payer: Self-pay

## 2020-07-04 NOTE — Telephone Encounter (Signed)
Agree with both of these verbal orders.

## 2020-07-04 NOTE — Telephone Encounter (Signed)
Colletta Maryland OT with [facility] left v/m requesting verbal orders for [facility] OT 2 x a wk for 2 wks  and 1 x a wk for 2 wks. Colletta Maryland also request verbal order for skilled nursing [facility] referral for pt having a pressure injury.

## 2020-07-05 ENCOUNTER — Other Ambulatory Visit: Payer: Self-pay

## 2020-07-05 ENCOUNTER — Encounter: Payer: Self-pay | Admitting: Registered Nurse

## 2020-07-05 ENCOUNTER — Encounter: Payer: Medicare Other | Attending: Registered Nurse | Admitting: Registered Nurse

## 2020-07-05 VITALS — BP 169/79 | HR 67 | Temp 98.1°F | Ht 60.0 in | Wt 107.4 lb

## 2020-07-05 DIAGNOSIS — I639 Cerebral infarction, unspecified: Secondary | ICD-10-CM

## 2020-07-05 DIAGNOSIS — E785 Hyperlipidemia, unspecified: Secondary | ICD-10-CM

## 2020-07-05 DIAGNOSIS — R569 Unspecified convulsions: Secondary | ICD-10-CM | POA: Diagnosis not present

## 2020-07-05 DIAGNOSIS — I10 Essential (primary) hypertension: Secondary | ICD-10-CM | POA: Diagnosis not present

## 2020-07-05 DIAGNOSIS — E1121 Type 2 diabetes mellitus with diabetic nephropathy: Secondary | ICD-10-CM | POA: Diagnosis not present

## 2020-07-05 DIAGNOSIS — Z794 Long term (current) use of insulin: Secondary | ICD-10-CM

## 2020-07-05 NOTE — Progress Notes (Unsigned)
Subjective:    Patient ID: Taylor Castaneda, female    DOB: September 03, 1932, 87 y.o.   MRN: [id number]  HPI: Taylor Castaneda is a 87 y.o. female who is here for hospital follow up visit of her Subcortical Infarction, Seizure Disorder, Essential Hypertension, Dyslipidemia and Controlled Type 2 DM, with long-term current use of insulin. Taylor Castaneda presented to [facility] on 05/29/2020 for evaluation of seizure and possible fall. Her daughter found the patient on the floor after a fall versus seizure. She also complained abdominal/ back pain . EMS was called and she was brought to [facility].  CT Head Wo Contrast:  IMPRESSION: 1. No evidence of acute intracranial abnormality. 2. Mild chronic small vessel ischemic disease and moderately advanced cerebral atrophy. 3. No acute cervical spine fracture.  CT Thoracic Spine WO Contrast: CT Lumbar Spine WO Contrast IMPRESSION: 1. No acute osseous abnormality identified in the thoracic or lumbar spine. 2. Thoracic and lumbar disc and facet degeneration as above. Severe spinal stenosis at L4-5. 3. Large nonobstructing left renal stone. 4. Aortic Atherosclerosis (ICD10-I70.0).  CT Cervical Spine WO Contrast:  IMPRESSION: 1. No evidence of acute intracranial abnormality. 2. Mild chronic small vessel ischemic disease and moderately advanced cerebral atrophy. 3. No acute cervical spine fracture.  US Abdomen RUQ IMPRESSION: 1. Cholelithiasis without sonographic evidence of acute cholecystitis. 2. Cirrhosis. 3. Patent main portal vein with hepatopetal flow.  MR Abdomen MRCP W WO Contrast: IMPRESSION: 1. Examination is limited due to respiratory motion. 2. Cholelithiasis but no MR findings for acute cholecystitis. 3. Suspect small common bile duct stones distally. No common bile duct dilatation. 4. Significant pancreatic atrophy. There is also pancreatic duct dilatation in the mid body tail region with an abrupt transition but no obvious obstructing lesion.  Possible pancreatic stricture. CT abdomen with pancreatic protocol may be helpful for further evaluation. 5. Simple renal cysts and a 10 mm left renal calculus.  DG ERCP:  IMPRESSION: ERCP with findings of choledocholithiasis, biliary plasty, sweeping and presumed sphincterotomy.  Taylor Castaneda underwent by Dr. Lyndel Safe  On12/28/2021.  ENDOSCOPIC RETROGRADE CHOLANGIOPANCREATOGRAPHY (ERCP) WITH PROPOFOL N/A General  SPHINCTEROTOMY    REMOVAL OF STONES    BILIARY DILATION    BIOPSY      MR Brain WO Contrast:  IMPRESSION: Multiple punctate foci of acute ischemia within the right centrum semiovale. No hemorrhage or mass effect.  MR Angio Head WO Contrast:  IMPRESSION: 1. Advanced intracranial atherosclerosis. No large vessel occlusion identified, but: - Severe stenosis at the Right MCA bifurcation, with long segment involvement of the superior M2. - Severe stenosis distal Left ICA siphon and Left MCA superior M2 branch origin.  2. Moderate stenosis Right PCA P2 segment.  Neurology was consulted, she was maintained on aspirin and Plavix for CVA Prophylaxis x 3 months, then Plavix alone.   CT Abdomen Pelvis WO Contrast:  IMPRESSION: 1. No acute abdominopelvic abnormality. 2. Subacute appearing fracture through the lower sacrum. 3. Large bilateral pleural effusions with adjacent compressive atelectasis. 4. Cardiomegaly.  Aortic Atherosclerosis (ICD10-I70.0).  EEG was performed on 06/21/2020:  IMPRESSION: This study is suggestive of mild diffuse encephalopathy, nonspecific etiology. No seizures or epileptiform discharges were seen throughout the recording.  Taylor Castaneda   Taylor Castaneda was admitted to inpatient rehabilitation on 06/10/2020 and discharged home on 06/24/2020. She is receiving Home Health Therapy at [facility]. She denies any pain. She rates her pain 0. Also reports her appetite is fair.   Daughter in room and all  questions answered. She also  reports Taylor Castaneda had the sacral duoderm on prior to being transferred to inpatient rehabilitation. This provider looked at her buttocks, she has some redness on her buttock, duo derm was removed. Educated on keeping her skin clean and dry, she verbalizes understanding.   Taylor Castaneda arrived in wheelchair.    Pain Inventory Average Pain 0 Pain Right Now 0 My pain is numbness sometime in the feet.  LOCATION OF PAIN  In both feet, left side weakness in the arm and leg.  BOWEL Number of stools per week: 2 Oral laxative use No  Type of laxative - None Enema or suppository use No  History of colostomy No  Incontinent No    BLADDER Normal In and out cath, frequency n/a Able to self cath No  Bladder incontinence No  Frequent urination No  Leakage with coughing No  Difficulty starting stream No  Incomplete bladder emptying No    Mobility walk without assistance use a cane use a walker how many minutes can you walk? Not known ability to climb steps?  no do you drive?  no use a wheelchair needs help with transfers  Function retired I need assistance with the following:  feeding, dressing, bathing, toileting, meal prep, household duties and shopping Do you have any goals in this area?  yes  Neuro/Psych weakness trouble walking confusion loss of taste or smell  Prior Studies Any changes since last visit?  no New Patient  Physicians involved in your care Any changes since last visit?  no New Patient   Family History  Problem Relation Age of Onset  . Heart attack Father 3  . Stroke Father 97  . Diabetes Father   . Hypertension Father   . Heart disease Father        before age 23  . Breast cancer Mother   . Cancer Mother   . Diabetes Sister   . Cancer Sister   . Peripheral vascular disease Sister   . Breast cancer Sister   . Hypertension Son   . COPD Neg Hx   . Asthma Neg Hx   . Colon cancer Neg Hx   . Esophageal cancer Neg Hx   . Rectal cancer Neg Hx    . Stomach cancer Neg Hx    Social History   Socioeconomic History  . Marital status: Widowed    Spouse name: Not on file  . Number of children: 4  . Years of education: Not on file  . Highest education level: High school graduate  Occupational History  . Occupation: retired    Fish farm manager: Retired    Comment: Regulatory affairs officer  Tobacco Use  . Smoking status: Former Smoker    Years: 2.00    Types: Cigarettes    Quit date: 06/04/1973    Years since quitting: 47.1  . Smokeless tobacco: Never Used  . Tobacco comment: Smoked for 5 years 1965-1970 up to 1/2 pp week  Vaping Use  . Vaping Use: Never used  Substance and Sexual Activity  . Alcohol use: Not Currently  . Drug use: No  . Sexual activity: Never  Other Topics Concern  . Not on file  Social History Narrative   03/10/19 Lives alone, no pets   Neighbors keep an eye on each other - 12 unit condo.    Family nearby.   Occ: retired Regulatory affairs officer   Activity: walking   Diet: good water, fruits/vegetables daily   Caffeine- some tea, occas soda   Social  Determinants of Health   Financial Resource Strain: Not on file  Food Insecurity: Not on file  Transportation Needs: Not on file  Physical Activity: Not on file  Stress: Not on file  Social Connections: Not on file   Past Surgical History:  Procedure Laterality Date  . ABI  11/2013   WNL  . APPENDECTOMY    . BILIARY DILATION  05/31/2020   Procedure: BILIARY DILATION;  Surgeon: Jackquline Denmark, MD;  Location: [facility] ENDOSCOPY;  Service: Endoscopy;;  . BIOPSY  05/31/2020   Procedure: BIOPSY;  Surgeon: Jackquline Denmark, MD;  Location: [place];  Service: Endoscopy;;  . CARPAL TUNNEL RELEASE     Bilateral   . CATARACT EXTRACTION Bilateral 2006   Implants  . CHOLECYSTECTOMY N/A 06/01/2020   Procedure: LAPAROSCOPIC CHOLECYSTECTOMY;  Surgeon: Clovis Riley, MD;  Location: [place];  Service: General;  Laterality: N/A;  . COLECTOMY  1998   For cancer  . COLONOSCOPY  2008   Dr.Stark, [place]  2011  . COLONOSCOPY  06/2013   1 hyperplastic polyp, ileocecal anastomosis [facility] Plan)  . dexa  02/2012, 03/2014   T score -2.2 at hip overall stable  . ENDOSCOPIC RETROGRADE CHOLANGIOPANCREATOGRAPHY (ERCP) WITH PROPOFOL N/A 05/31/2020   Procedure: ENDOSCOPIC RETROGRADE CHOLANGIOPANCREATOGRAPHY (ERCP) WITH PROPOFOL;  Surgeon: Jackquline Denmark, MD;  Location: [facility] ENDOSCOPY;  Service: Endoscopy;  Laterality: N/A;  . REFRACTIVE SURGERY    . REMOVAL OF STONES  05/31/2020   Procedure: REMOVAL OF STONES;  Surgeon: Jackquline Denmark, MD;  Location: [facility] ENDOSCOPY;  Service: Endoscopy;;  . [name]  05/31/2020   Procedure: [name];  Surgeon: Jackquline Denmark, MD;  Location: [facility] ENDOSCOPY;  Service: Endoscopy;;  . TONSILLECTOMY AND ADENOIDECTOMY    . TOTAL KNEE ARTHROPLASTY Left 12/17/2016   Procedure: LEFT TOTAL KNEE ARTHROPLASTY;  Surgeon: Gaynelle Arabian, MD;  Location: [facility];  Service: Orthopedics;  Laterality: Left;   Past Medical History:  Diagnosis Date  . Anemia   . Diabetes mellitus, type II ([place])   . Glaucoma    Dr.Hecker  . History of colon cancer 1998   Dr. Lennie Hummer  . Hyperlipemia   . Osteopenia 02/2012, 03/2014   DEXA hip -2.2  . Renal insufficiency   . Seizures ([place])   . Stenosing tenosynovitis of finger of left hand 2016   index - s/p steroid injection x2   BP (!) 173/80   Pulse 66   Temp 98.1 F (36.7 C)   Ht 5' (1.524 m)   Wt 107 lb 6.4 oz (48.7 kg)   SpO2 98%   BMI 20.98 kg/m   Opioid Risk Score:   Fall Risk Score:  `1  Depression screen PHQ 2/9  Depression screen [facility] 2/9 02/19/2020 02/19/2020 02/26/2019 02/18/2018 02/07/2017 01/30/2016 03/10/2014  Decreased Interest 2 1 0 0 0 0 0  Down, Depressed, Hopeless 0 0 0 0 0 0 0  PHQ - 2 Score 2 1 0 0 0 0 0  Altered sleeping 2 - 0 0 0 - -  Tired, decreased energy 2 - 0 0 0 - -  Change in appetite 2 - 0 0 1 - -  Feeling bad or failure about yourself  0 - 0 0 0 - -  Trouble concentrating 0 - 0 0 0 - -  Moving slowly or  fidgety/restless 0 - 0 0 0 - -  Suicidal thoughts 0 - 0 0 0 - -  PHQ-9 Score 8 - 0 0 1 - -  Difficult doing work/chores - -  Not difficult at all Not difficult at all Somewhat difficult - -  Some recent data might be hidden   Review of Systems  Constitutional: Negative.   HENT: Negative.   Respiratory: Negative.   Cardiovascular: Negative.   Gastrointestinal: Negative.   Endocrine: Negative.   Genitourinary: Negative.   Musculoskeletal: Positive for gait problem.  Skin: Negative.        Pressure sores on heels  Allergic/Immunologic: Negative.   Neurological: Positive for weakness.  Psychiatric/Behavioral: Positive for confusion.  All other systems reviewed and are negative.      Objective:   Physical Exam Vitals and nursing note reviewed.  Constitutional:      Appearance: Normal appearance.  Cardiovascular:     Rate and Rhythm: Normal rate and regular rhythm.     Pulses: Normal pulses.     Heart sounds: Normal heart sounds.  Pulmonary:     Effort: Pulmonary effort is normal.     Breath sounds: Normal breath sounds.  Musculoskeletal:     Cervical back: Normal range of motion and neck supple.     Comments: Normal Muscle Bulk and Muscle Testing Reveals:  Upper Extremities: Decreased ROM 90 Degrees  and Muscle Strength 5/5  Lower Extremities: Full ROM and Muscle Strength 5/5 Arrived in wheelcahair  Skin:    General: Skin is warm and dry.  Neurological:     Mental Status: She is alert and oriented to person, place, and time.  Psychiatric:        Mood and Affect: Mood normal.        Behavior: Behavior normal.           Assessment & Plan:  1.Subcortical Infarction: Continue Home Health Therapy with [facility]. She has a HFU appointment with Dr. Olga Millers. Continue to monitor.  2. Seizure Disorder: Continue current medication regimen. Neurology following. Continue to monitor.  3.Essential Hypertension: Continue current medication regimen. PCP Following.   4. Dyslipidemia: Continue current medication regimen. PCP Following. Continue to monitor.  5.Controlled Type 2 DM, with long-term current use of insulin. Continue current medication regimen. PCP Following. Continue to monitor.   F/U with Dr Letta Pate in 4-6 weeks

## 2020-07-05 NOTE — Telephone Encounter (Signed)
Lvm asking Colletta Maryland to call back.  Need to inform her Dr. Darnell Level is giving verbal orders for services requested for pt.

## 2020-07-06 DIAGNOSIS — I10 Essential (primary) hypertension: Secondary | ICD-10-CM | POA: Diagnosis not present

## 2020-07-06 DIAGNOSIS — Z7902 Long term (current) use of antithrombotics/antiplatelets: Secondary | ICD-10-CM

## 2020-07-06 DIAGNOSIS — Z9181 History of falling: Secondary | ICD-10-CM

## 2020-07-06 DIAGNOSIS — M8580 Other specified disorders of bone density and structure, unspecified site: Secondary | ICD-10-CM

## 2020-07-06 DIAGNOSIS — D649 Anemia, unspecified: Secondary | ICD-10-CM

## 2020-07-06 DIAGNOSIS — I69320 Aphasia following cerebral infarction: Secondary | ICD-10-CM | POA: Diagnosis not present

## 2020-07-06 DIAGNOSIS — E785 Hyperlipidemia, unspecified: Secondary | ICD-10-CM

## 2020-07-06 DIAGNOSIS — I700 Atherosclerosis of aorta: Secondary | ICD-10-CM

## 2020-07-06 DIAGNOSIS — G40909 Epilepsy, unspecified, not intractable, without status epilepticus: Secondary | ICD-10-CM | POA: Diagnosis not present

## 2020-07-06 DIAGNOSIS — E1139 Type 2 diabetes mellitus with other diabetic ophthalmic complication: Secondary | ICD-10-CM

## 2020-07-06 DIAGNOSIS — Z85038 Personal history of other malignant neoplasm of large intestine: Secondary | ICD-10-CM

## 2020-07-06 DIAGNOSIS — M47812 Spondylosis without myelopathy or radiculopathy, cervical region: Secondary | ICD-10-CM | POA: Diagnosis not present

## 2020-07-06 DIAGNOSIS — H409 Unspecified glaucoma: Secondary | ICD-10-CM

## 2020-07-06 DIAGNOSIS — Z7982 Long term (current) use of aspirin: Secondary | ICD-10-CM

## 2020-07-07 NOTE — Telephone Encounter (Signed)
Spoke with Taylor Castaneda informing her Dr. Darnell Level is giving verbal orders for services requested.

## 2020-07-12 ENCOUNTER — Other Ambulatory Visit: Payer: Self-pay

## 2020-07-12 ENCOUNTER — Other Ambulatory Visit: Payer: Self-pay | Admitting: Family Medicine

## 2020-07-12 ENCOUNTER — Ambulatory Visit (INDEPENDENT_AMBULATORY_CARE_PROVIDER_SITE_OTHER): Payer: Medicare Other | Admitting: Family Medicine

## 2020-07-12 ENCOUNTER — Encounter: Payer: Self-pay | Admitting: Family Medicine

## 2020-07-12 ENCOUNTER — Telehealth: Payer: Self-pay | Admitting: *Deleted

## 2020-07-12 VITALS — BP 166/68 | HR 73 | Temp 97.6°F | Ht 60.0 in | Wt 108.4 lb

## 2020-07-12 DIAGNOSIS — M25561 Pain in right knee: Secondary | ICD-10-CM | POA: Diagnosis not present

## 2020-07-12 DIAGNOSIS — Z8739 Personal history of other diseases of the musculoskeletal system and connective tissue: Secondary | ICD-10-CM

## 2020-07-12 DIAGNOSIS — Z794 Long term (current) use of insulin: Secondary | ICD-10-CM

## 2020-07-12 DIAGNOSIS — I10 Essential (primary) hypertension: Secondary | ICD-10-CM | POA: Diagnosis not present

## 2020-07-12 DIAGNOSIS — M1712 Unilateral primary osteoarthritis, left knee: Secondary | ICD-10-CM

## 2020-07-12 DIAGNOSIS — E1121 Type 2 diabetes mellitus with diabetic nephropathy: Secondary | ICD-10-CM

## 2020-07-12 MED ORDER — COLCHICINE 0.6 MG PO TABS: 0.6000 mg | ORAL_TABLET | Freq: Every day | ORAL | 3 refills | Status: DC | PRN

## 2020-07-12 NOTE — Progress Notes (Signed)
Patient ID: Taylor Castaneda, female    DOB: [birth year], 87 y.o.   MRN: [id number]  This visit was conducted in person.  BP (!) 166/68 (BP Location: Left Arm, Patient Position: Sitting, Cuff Size: Normal)   Pulse 73   Temp 97.6 F (36.4 C) (Temporal)   Ht 5' (1.524 m)   Wt 108 lb 6 oz (49.2 kg)   SpO2 99%   BMI 21.17 kg/m   BP Readings from Last 3 Encounters:  07/12/20 (!) 166/68  07/05/20 (!) 169/79  06/24/20 (!) 158/52    CC: R knee swelling  Subjective:   HPI: Taylor Castaneda is a 87 y.o. female presenting on 07/12/2020 for Joint Swelling (C/o right knee swelling, comes and goes.  PT comes in the home and pt's knee was popping.  Pt accompanied by son, [name]- temp 97.9.  Per [name], knee is warm to the touch. )   Recent prolonged hospitalization 05/30/2020 for fall associated with seizure. She did have gallbladder surgery and subsequent acute subcortical infarct s/p CIR stay 1/7-21/2022. She is currently on full dose aspirin and plavix, along with statin. Lasix and ramipril were stopped during recent CIR stay.   Pt stays at home and [name] son stays with her during the week. Other children help out on weekends.  Since home she's been feeling great.   Here today for ongoing R knee pain - can get warm and swollen. On and of knee pain since hospital, acutely worse this morning. Known h/o gout. Normally walks with walker - but knee pain has slowed her down.   HHPT has been working with her knee. Yesterday therapist noted knee popping but she didn't notice any trouble at that time.  No fevers/chills, skin rash, other joint pains, abd pain or nausea.   H/o L knee replacement  Upcoming neurology appt 2/21 ([place])  BP elevated in office today - also staying similarly elevated at home.      Relevant past medical, surgical, family and social history reviewed and updated as indicated. Interim medical history since our last visit reviewed. Allergies and medications reviewed and  updated. Outpatient Medications Prior to Visit  Medication Sig Dispense Refill  . acetaminophen (TYLENOL) 325 MG tablet Take 325-650 mg by mouth every 6 (six) hours as needed (for pain).    Marland Kitchen aspirin EC 325 MG EC tablet Take 1 tablet (325 mg total) by mouth daily. 30 tablet 0  . atorvastatin (LIPITOR) 40 MG tablet Take 1 tablet (40 mg total) by mouth daily at 6 PM. 90 tablet 3  . Blood Glucose Monitoring Suppl (FREESTYLE INSULINX SYSTEM) w/Device KIT Glucose testing    . Carboxymethylcellul-Glycerin 1-0.25 % SOLN Place 1-2 drops into both eyes 3 (three) times daily as needed (for dry/irritated eyes.).    Marland Kitchen carvedilol (COREG) 12.5 MG tablet Take 1 tablet (12.5 mg total) by mouth 2 (two) times daily. 180 tablet 3  . Cholecalciferol (VITAMIN D3) 25 MCG (1000 UT) CAPS Take 1 capsule (1,000 Units total) by mouth daily. 30 capsule 0  . Cinnamon 500 MG capsule Take 1,000 mg by mouth daily.    . clopidogrel (PLAVIX) 75 MG tablet Take 1 tablet (75 mg total) by mouth daily. 30 tablet 0  . diclofenac Sodium (VOLTAREN) 1 % GEL Apply 2 g topically 4 (four) times daily. 2 g 0  . Ferrous Sulfate (IRON) 325 (65 Fe) MG TABS Take 1 tablet (325 mg total) by mouth See admin instructions. Take 325 mg by mouth  in the evening on Mondays and Thursdays only 30 tablet 0  . insulin glargine (LANTUS) 100 UNIT/ML Solostar Pen Inject 8 Units into the skin daily. 15 mL 11  . levETIRAcetam (KEPPRA) 750 MG tablet Take 1 tablet (750 mg total) by mouth 2 (two) times daily. 60 tablet 0  . Multiple Vitamins-Minerals (PRESERVISION AREDS 2 PO) Take 1 capsule by mouth 2 (two) times daily.    . pantoprazole (PROTONIX) 40 MG tablet Take 1 tablet (40 mg total) by mouth 2 (two) times daily. 60 tablet 0  . colchicine 0.6 MG tablet Take 1 tablet (0.6 mg total) by mouth daily as needed (gout flare). First day of gout flare, may take 1 tablet twice daily. (Patient taking differently: Take 0.6 mg by mouth daily as needed (for gout flares, and may  take 0.6 mg TWICE DAILY on 1st day of flare).) 30 tablet 3   No facility-administered medications prior to visit.     Per HPI unless specifically indicated in ROS section below Review of Systems Objective:  BP (!) 166/68 (BP Location: Left Arm, Patient Position: Sitting, Cuff Size: Normal)   Pulse 73   Temp 97.6 F (36.4 C) (Temporal)   Ht 5' (1.524 m)   Wt 108 lb 6 oz (49.2 kg)   SpO2 99%   BMI 21.17 kg/m   Wt Readings from Last 3 Encounters:  07/12/20 108 lb 6 oz (49.2 kg)  07/05/20 107 lb 6.4 oz (48.7 kg)  06/15/20 125 lb 8 oz (56.9 kg)      Physical Exam Vitals and nursing note reviewed.  Constitutional:      Appearance: Normal appearance. She is not ill-appearing.     Comments: Sitting in wheelchair  Musculoskeletal:        General: Swelling and tenderness present.     Right lower leg: No edema.     Left lower leg: No edema.     Comments:  L knee s/p replacement R knee exam: No deformity on inspection. Discomfort with palpation of knee anteriorly and medially  Mild swelling noted. Limited ROM to full flexion/extension without crepitus. No popliteal fullness.  No abnormal patellar mobility.   Skin:    General: Skin is warm and dry.     Findings: No rash.  Neurological:     Mental Status: She is alert.  Psychiatric:        Mood and Affect: Mood normal.        Behavior: Behavior normal.       Lab Results  Component Value Date   HGBA1C 7.9 (H) 06/02/2020    Assessment & Plan:  This visit occurred during the SARS-CoV-2 public health emergency.  Safety protocols were in place, including screening questions prior to the visit, additional usage of staff PPE, and extensive cleaning of exam room while observing appropriate contact time as indicated for disinfecting solutions.   Problem List Items Addressed This Visit    OA (osteoarthritis) of knee   Relevant Medications   colchicine 0.6 MG tablet   History of gout    Update gout levels. H/o infrequent  flares, but possibly becoming more frequent recently.       Essential hypertension    Elevated today in setting of recent stroke.  Update labs, consider starting losartan for uricosuric effect.  She has neurology f/u later this month.  Lasix and ramipril were stopped during CIR stay.       Controlled type 2 diabetes mellitus with diabetic nephropathy, with long-term current use  of insulin ([place])    Continue endo f/u.       Acute pain of right knee - Primary    Possible gout flare - check labs today and I did recommend they try colchicine they have at home (holding statin for 1 day when taken).  I don't think she has septic joint - no overlying break in skin, fever or malaise, and she still has pretty good range of motion without significant pain.       Relevant Orders   Sedimentation rate   CBC with Differential/Platelet   Basic metabolic panel   Uric acid   C-reactive protein       Meds ordered this encounter  Medications  . colchicine 0.6 MG tablet    Sig: Take 1 tablet (0.6 mg total) by mouth daily as needed (gout flare). First day of gout flare, may take 1 tablet twice daily.    Dispense:  30 tablet    Refill:  3   Orders Placed This Encounter  Procedures  . Sedimentation rate  . CBC with Differential/Platelet  . Basic metabolic panel  . Uric acid  . C-reactive protein    Patient Instructions  I think you may have gout flare - labs today. Take colchicine- may take 2 tablets on first day then 1 tablet daily until gout flare resolved. Hold lipitor while on colchicine.  Rest knee for now.  Labs will also help determine need for second blood pressure medicine as your blood pressures were too high today.    Follow up plan: Return if symptoms worsen or fail to improve.  [name], MD

## 2020-07-12 NOTE — Telephone Encounter (Signed)
Spoke to patient's son and was advised that he will have her here at the office today at 4:30 to be seen. Patient and son had a negative covid screening.

## 2020-07-12 NOTE — Telephone Encounter (Signed)
Noted  

## 2020-07-12 NOTE — Telephone Encounter (Signed)
Can she come in today at 4:30pm?

## 2020-07-12 NOTE — Patient Instructions (Signed)
I think you may have gout flare - labs today. Take colchicine- may take 2 tablets on first day then 1 tablet daily until gout flare resolved. Hold lipitor while on colchicine.  Rest knee for now.  Labs will also help determine need for second blood pressure medicine as your blood pressures were too high today.

## 2020-07-12 NOTE — Telephone Encounter (Signed)
Patient's son called wanting to make sure that Dr. Danise Mina knew that his mom had been in the hospital. Patient's son Taylor Castaneda is taking care of his mom since she came home from the hospital. Taylor Castaneda stated that his mom has an upcoming appointment scheduled with Dr. Danise Mina 08/19/20 and he wants to know if she should come in sooner. Taylor Castaneda stated that his mom's right knee is swollen and warm to the touch. Taylor Castaneda stated that she has a history of gout in her foot and is wondering if the gout may have moved up into her knee. Taylor Castaneda stated that his mom has Diclofenac gel that he is applying to her knee which helps some. Taylor Castaneda stated that when she fell back in December he is wondering if injured her right knee. Taylor Castaneda stated that his mom has had knee surgery previously on her left knee. Taylor Castaneda stated that if his mom needs to come in to have her knee checked he can get her here.

## 2020-07-13 DIAGNOSIS — M25561 Pain in right knee: Secondary | ICD-10-CM | POA: Insufficient documentation

## 2020-07-13 LAB — CBC WITH DIFFERENTIAL/PLATELET
Basophils Absolute: 0.1 10*3/uL (ref 0.0–0.1)
Basophils Relative: 0.9 % (ref 0.0–3.0)
Eosinophils Absolute: 0.1 10*3/uL (ref 0.0–0.7)
Eosinophils Relative: 1 % (ref 0.0–5.0)
HCT: 31.9 % — ABNORMAL LOW (ref 36.0–46.0)
Hemoglobin: 10.2 g/dL — ABNORMAL LOW (ref 12.0–15.0)
Lymphocytes Relative: 18.2 % (ref 12.0–46.0)
Lymphs Abs: 2.1 10*3/uL (ref 0.7–4.0)
MCHC: 32.1 g/dL (ref 30.0–36.0)
MCV: 92 fl (ref 78.0–100.0)
Monocytes Absolute: 1.4 10*3/uL — ABNORMAL HIGH (ref 0.1–1.0)
Monocytes Relative: 12 % (ref 3.0–12.0)
Neutro Abs: 7.7 10*3/uL (ref 1.4–7.7)
Neutrophils Relative %: 67.9 % (ref 43.0–77.0)
Platelets: 203 10*3/uL (ref 150.0–400.0)
RBC: 3.47 Mil/uL — ABNORMAL LOW (ref 3.87–5.11)
RDW: 16.4 % — ABNORMAL HIGH (ref 11.5–15.5)
WBC: 11.3 10*3/uL — ABNORMAL HIGH (ref 4.0–10.5)

## 2020-07-13 LAB — SEDIMENTATION RATE: Sed Rate: 80 mm/hr — ABNORMAL HIGH (ref 0–30)

## 2020-07-13 LAB — BASIC METABOLIC PANEL
BUN: 30 mg/dL — ABNORMAL HIGH (ref 6–23)
CO2: 23 mEq/L (ref 19–32)
Calcium: 8.5 mg/dL (ref 8.4–10.5)
Chloride: 104 mEq/L (ref 96–112)
Creatinine, Ser: 1.28 mg/dL — ABNORMAL HIGH (ref 0.40–1.20)
GFR: 38.06 mL/min — ABNORMAL LOW (ref >60.00)
Glucose, Bld: 226 mg/dL — ABNORMAL HIGH (ref 70–99)
Potassium: 4.5 mEq/L (ref 3.5–5.1)
Sodium: 137 mEq/L (ref 135–145)

## 2020-07-13 LAB — URIC ACID: Uric Acid, Serum: 6.9 mg/dL (ref 2.4–7.0)

## 2020-07-13 LAB — C-REACTIVE PROTEIN: CRP: 9.3 mg/dL (ref 0.5–20.0)

## 2020-07-13 MED ORDER — COLCHICINE 0.6 MG PO TABS: 0.6000 mg | ORAL_TABLET | Freq: Every day | ORAL | 3 refills | Status: AC | PRN

## 2020-07-13 NOTE — Addendum Note (Signed)
Addended by: [name] on: 07/13/2020 02:08 PM   Modules accepted: Orders

## 2020-07-13 NOTE — Telephone Encounter (Signed)
Pharmacy requests refill on: Colchicine 0.6 mg   LAST REFILL: 07/12/2020 (Q-30, R-3)  LAST OV: 07/12/2020 NEXT OV: 08/19/2020 PHARMACY: [place], [place]

## 2020-07-13 NOTE — Assessment & Plan Note (Addendum)
Elevated today in setting of recent stroke.  Update labs, consider starting losartan for uricosuric effect.  She has neurology f/u later this month.  Lasix and ramipril were stopped during CIR stay.

## 2020-07-13 NOTE — Assessment & Plan Note (Signed)
Update gout levels. H/o infrequent flares, but possibly becoming more frequent recently.

## 2020-07-13 NOTE — Assessment & Plan Note (Signed)
Possible gout flare - check labs today and I did recommend they try colchicine they have at home (holding statin for 1 day when taken).  I don't think she has septic joint - no overlying break in skin, fever or malaise, and she still has pretty good range of motion without significant pain.

## 2020-07-13 NOTE — Assessment & Plan Note (Signed)
Continue endo f/u.

## 2020-07-15 ENCOUNTER — Telehealth: Payer: Self-pay

## 2020-07-15 ENCOUNTER — Other Ambulatory Visit: Payer: Self-pay | Admitting: Family Medicine

## 2020-07-15 MED ORDER — LOSARTAN POTASSIUM 25 MG PO TABS: 25.0000 mg | ORAL_TABLET | Freq: Every day | ORAL | 6 refills | Status: DC

## 2020-07-15 NOTE — Telephone Encounter (Signed)
Daughter [name] called to inquire on whether patient is to continue to NOT take the potassium Rx... Did advise on previous note and was d/c per hospital discharge... Rx likely on automatic refill... advised [name] that if it is confirmed that pt should no longer take at this time, she should call the pharmacy to have them d/c the Rx from the system...  Also calling requesting lab results  Please advise

## 2020-07-15 NOTE — Telephone Encounter (Signed)
Spoke with pt's daughter, [name] (on dpr), relaying Dr. Synthia Innocent message.  Also, mentioned that Dr. Darnell Level sent lab results vai MyChart.  She verbalizes understanding and expresses her thanks.

## 2020-07-15 NOTE — Telephone Encounter (Signed)
Yes stay off potassium at this time. Lab results sent via result note.

## 2020-07-20 ENCOUNTER — Telehealth: Payer: Self-pay | Admitting: Family Medicine

## 2020-07-20 NOTE — Telephone Encounter (Signed)
Called [name] James back to get clarification. It was a form sent this morning 07/20/20 he states that it is a revised form for wound care that we should have received today. This is a urgent request and needs to be completed as soon as possible. Thank you, EM

## 2020-07-20 NOTE — Telephone Encounter (Signed)
Taylor Haw do you know anything about this? I signed a wound care form yesterday or today about this and sent it to be faxed back. Have not received anything else in interim

## 2020-07-20 NOTE — Telephone Encounter (Signed)
Sent urgent request from [facility]. Patient is in declining health. They need it back soon . Dr Darnell Level states that it should have been faxed back yesterday. Just FYI

## 2020-07-21 MED ORDER — CLOPIDOGREL BISULFATE 75 MG PO TABS: 75.0000 mg | ORAL_TABLET | Freq: Every day | ORAL | 0 refills | Status: DC

## 2020-07-21 NOTE — Telephone Encounter (Signed)
I haven't seen anything but I'll follow up on it.  I checked with Fabio Asa and she has not seen anything yet.  Spoke with [name] James asking about revised fax.  States it was faxed yesterday morning to [phone].  Notified him we have not received it yet and provided fax # [phone] to try and refax order.  Says he will check with his office and make sure revised order gets faxed over.

## 2020-07-22 ENCOUNTER — Telehealth: Payer: Self-pay

## 2020-07-22 NOTE — Telephone Encounter (Signed)
Lvm asking [name] James to call back.  Need to inform him we still have not received faxed wound care orders for pt.  Per [name], he can try emailing them to [email or web address] and she will print them for Dr. Darnell Level to sign.

## 2020-07-22 NOTE — Telephone Encounter (Signed)
Taylor Castaneda (on [place]) called requesting refill of Protonix that was not previously filled by you... Also was advised that she was supposed to schedule a lab only appt to recheck labs 1 wk following last OV... Taylor Castaneda wanted to make sure that pt did not need to f/u prior to 3/18 OV already scheduled...   Please advise

## 2020-07-25 ENCOUNTER — Encounter: Payer: Self-pay | Admitting: Diagnostic Neuroimaging

## 2020-07-25 ENCOUNTER — Ambulatory Visit: Payer: Medicare Other | Admitting: Diagnostic Neuroimaging

## 2020-07-25 VITALS — BP 118/66 | HR 68 | Ht 60.0 in | Wt 108.0 lb

## 2020-07-25 DIAGNOSIS — R569 Unspecified convulsions: Secondary | ICD-10-CM

## 2020-07-25 DIAGNOSIS — R413 Other amnesia: Secondary | ICD-10-CM

## 2020-07-25 DIAGNOSIS — I6330 Cerebral infarction due to thrombosis of unspecified cerebral artery: Secondary | ICD-10-CM

## 2020-07-25 MED ORDER — LEVETIRACETAM 750 MG PO TABS: 750.0000 mg | ORAL_TABLET | Freq: Two times a day (BID) | ORAL | 4 refills | Status: DC

## 2020-07-25 MED ORDER — CLOPIDOGREL BISULFATE 75 MG PO TABS: 75.0000 mg | ORAL_TABLET | Freq: Every day | ORAL | 4 refills | Status: DC

## 2020-07-25 MED ORDER — PANTOPRAZOLE SODIUM 40 MG PO TBEC: 40.0000 mg | DELAYED_RELEASE_TABLET | Freq: Two times a day (BID) | ORAL | 1 refills | Status: DC

## 2020-07-25 NOTE — Telephone Encounter (Signed)
LVM for Taylor Castaneda to return call to office.

## 2020-07-25 NOTE — Progress Notes (Signed)
[facility]  PATIENT: Taylor Castaneda DOB: [birth year]  REFERRING CLINICIAN: hospital HISTORY FROM: patient and daughter  REASON FOR VISIT: follow up    HISTORICAL  CHIEF COMPLAINT:  Chief Complaint  Patient presents with  . Cerebral thrombosis w/cerebral infarction    Rm 7 dgtr- [place] "getting PT/OT/ST at home, using walker at home"    HISTORY OF PRESENT ILLNESS:   UPDATE (07/25/20, VRP): Since last visit, admitted to hospital on 05/30/20 for possible seizure; also treated for gallstones and then found to have small stroke on MRI. Then transferred to inpatient rehab on 06/10/20. Since then has been stable.   PRIOR HPI: 87 year old female here for evaluation of seizure.  Patient was found unresponsive at home on 03/05/2019 at 8:00 in the evening by her family who was trying to reach her by phone.  Neighbor noted she was acting erratically around 4 PM.  She was found on the kitchen floor with eye movements, shaking and convulsions.  Blood sugar at home was 202.  Patient was taken to the hospital for evaluation.  Patient had right gaze preference and left neglect.  Code stroke was activated.  Patient was diagnosed with status epilepticus and treated with antiseizure medications.  MRI of the brain showed no acute findings.  Of note patient has had some mild memory problems in the last few months.  She was living alone.  She was able to drive, take care of ADLs.  Now patient living with daughter.  She is not driving.  She is tolerating levetiracetam 250 mg twice a day.   REVIEW OF SYSTEMS: Full 14 system review of systems performed and negative with exception of: As per HPI.  ALLERGIES: Allergies  Allergen Reactions  . Gabapentin Other (See Comments)    Dizziness  . Pravastatin Sodium Other (See Comments)    "Anemia," per Dr Gilford Rile, [facility] MEDICATIONS: Outpatient Medications Prior to Visit  Medication Sig Dispense Refill  . acetaminophen  (TYLENOL) 325 MG tablet Take 325-650 mg by mouth every 6 (six) hours as needed (for pain).    Marland Kitchen aspirin EC 325 MG EC tablet Take 1 tablet (325 mg total) by mouth daily. 30 tablet 0  . atorvastatin (LIPITOR) 40 MG tablet Take 1 tablet (40 mg total) by mouth daily at 6 PM. 90 tablet 3  . Blood Glucose Monitoring Suppl (FREESTYLE INSULINX SYSTEM) w/Device KIT Glucose testing    . Carboxymethylcellul-Glycerin 1-0.25 % SOLN Place 1-2 drops into both eyes 3 (three) times daily as needed (for dry/irritated eyes.).    Marland Kitchen carvedilol (COREG) 12.5 MG tablet Take 1 tablet (12.5 mg total) by mouth 2 (two) times daily. 180 tablet 3  . Cholecalciferol (VITAMIN D3) 25 MCG (1000 UT) CAPS Take 1 capsule (1,000 Units total) by mouth daily. 30 capsule 0  . Cinnamon 500 MG capsule Take 1,000 mg by mouth daily.    . clopidogrel (PLAVIX) 75 MG tablet Take 1 tablet (75 mg total) by mouth daily. 30 tablet 0  . colchicine 0.6 MG tablet Take 1 tablet (0.6 mg total) by mouth daily as needed (gout flare). First day of gout flare, may take 1 tablet twice daily. 30 tablet 3  . diclofenac Sodium (VOLTAREN) 1 % GEL Apply 2 g topically 4 (four) times daily. 2 g 0  . Ferrous Sulfate (IRON) 325 (65 Fe) MG TABS Take 1 tablet (325 mg total) by mouth See admin instructions. Take 325 mg by mouth in the  evening on Mondays and Thursdays only 30 tablet 0  . insulin glargine (LANTUS) 100 UNIT/ML Solostar Pen Inject 8 Units into the skin daily. 15 mL 11  . levETIRAcetam (KEPPRA) 750 MG tablet Take 1 tablet (750 mg total) by mouth 2 (two) times daily. 60 tablet 0  . losartan (COZAAR) 25 MG tablet Take 1 tablet (25 mg total) by mouth daily. 30 tablet 6  . Multiple Vitamins-Minerals (PRESERVISION AREDS 2 PO) Take 1 capsule by mouth 2 (two) times daily.    . pantoprazole (PROTONIX) 40 MG tablet Take 1 tablet (40 mg total) by mouth 2 (two) times daily. 60 tablet 1   No facility-administered medications prior to visit.    PAST MEDICAL  HISTORY: Past Medical History:  Diagnosis Date  . Anemia   . Cerebral infarction ([place]) 05/2020  . Diabetes mellitus, type II ([place])   . Glaucoma    Dr.Hecker  . History of colon cancer 1998   Dr. Lennie Hummer  . Hyperlipemia   . Osteopenia 02/2012, 03/2014   DEXA hip -2.2  . Renal insufficiency   . Seizures ([place])   . Stenosing tenosynovitis of finger of left hand 2016   index - s/p steroid injection x2  . Stroke [facility])     PAST SURGICAL HISTORY: Past Surgical History:  Procedure Laterality Date  . ABI  11/2013   WNL  . APPENDECTOMY    . BILIARY DILATION  05/31/2020   Procedure: BILIARY DILATION;  Surgeon: Jackquline Denmark, MD;  Location: [facility] ENDOSCOPY;  Service: Endoscopy;;  . BIOPSY  05/31/2020   Procedure: BIOPSY;  Surgeon: Jackquline Denmark, MD;  Location: [place];  Service: Endoscopy;;  . CARPAL TUNNEL RELEASE     Bilateral   . CATARACT EXTRACTION Bilateral 2006   Implants  . CHOLECYSTECTOMY N/A 06/01/2020   Procedure: LAPAROSCOPIC CHOLECYSTECTOMY;  Surgeon: Clovis Riley, MD;  Location: [place];  Service: General;  Laterality: N/A;  . COLECTOMY  1998   For cancer  . COLONOSCOPY  2008   Dr.Stark, [place] 2011  . COLONOSCOPY  06/2013   1 hyperplastic polyp, ileocecal anastomosis [facility])  . dexa  02/2012, 03/2014   T score -2.2 at hip overall stable  . ENDOSCOPIC RETROGRADE CHOLANGIOPANCREATOGRAPHY (ERCP) WITH PROPOFOL N/A 05/31/2020   Procedure: ENDOSCOPIC RETROGRADE CHOLANGIOPANCREATOGRAPHY (ERCP) WITH PROPOFOL;  Surgeon: Jackquline Denmark, MD;  Location: [facility] ENDOSCOPY;  Service: Endoscopy;  Laterality: N/A;  . REFRACTIVE SURGERY    . REMOVAL OF STONES  05/31/2020   Procedure: REMOVAL OF STONES;  Surgeon: Jackquline Denmark, MD;  Location: [facility] ENDOSCOPY;  Service: Endoscopy;;  . [name]  05/31/2020   Procedure: [name];  Surgeon: Jackquline Denmark, MD;  Location: [facility] ENDOSCOPY;  Service: Endoscopy;;  . TONSILLECTOMY AND ADENOIDECTOMY    . TOTAL KNEE ARTHROPLASTY Left 12/17/2016    Procedure: LEFT TOTAL KNEE ARTHROPLASTY;  Surgeon: Gaynelle Arabian, MD;  Location: [facility]S;  Service: Orthopedics;  Laterality: Left;    FAMILY HISTORY: Family History  Problem Relation Age of Onset  . Heart attack Father 74  . Stroke Father 92  . Diabetes Father   . Hypertension Father   . Heart disease Father        before age 53  . Breast cancer Mother   . Cancer Mother   . Diabetes Sister   . Cancer Sister   . Peripheral vascular disease Sister   . Breast cancer Sister   . Hypertension Son   . COPD Neg Hx   . Asthma Neg Hx   .  Colon cancer Neg Hx   . Esophageal cancer Neg Hx   . Rectal cancer Neg Hx   . Stomach cancer Neg Hx     SOCIAL HISTORY: Social History   Socioeconomic History  . Marital status: Widowed    Spouse name: Not on file  . Number of children: 4  . Years of education: Not on file  . Highest education level: High school graduate  Occupational History  . Occupation: retired    Fish farm manager: Retired    Comment: Regulatory affairs officer  Tobacco Use  . Smoking status: Former Smoker    Years: 2.00    Types: Cigarettes    Quit date: 06/04/1973    Years since quitting: 47.1  . Smokeless tobacco: Never Used  . Tobacco comment: Smoked for 5 years 1965-1970 up to 1/2 pp week  Vaping Use  . Vaping Use: Never used  Substance and Sexual Activity  . Alcohol use: Not Currently  . Drug use: No  . Sexual activity: Never  Other Topics Concern  . Not on file  Social History Narrative   07/25/20  Lives alone, no pets, has family with her 24/7   Neighbors keep an eye on each other - 12 unit condo.    Family nearby.   Occ: retired Regulatory affairs officer   Activity: walking   Diet: good water, fruits/vegetables daily   Caffeine- some tea, occas soda   Social Determinants of Radio broadcast assistant Strain: Not on file  Food Insecurity: Not on file  Transportation Needs: Not on file  Physical Activity: Not on file  Stress: Not on file  Social Connections: Not on file  Intimate  Partner Violence: Not on file     PHYSICAL EXAM  GENERAL EXAM/CONSTITUTIONAL: Vitals:  Vitals:   07/25/20 1528  BP: 118/66  Pulse: 68  Weight: 108 lb (49 kg)  Height: 5' (1.524 m)   Body mass index is 21.09 kg/m. Wt Readings from Last 3 Encounters:  07/25/20 108 lb (49 kg)  07/12/20 108 lb 6 oz (49.2 kg)  07/05/20 107 lb 6.4 oz (48.7 kg)    Patient is in no distress; well developed, nourished and groomed; neck is supple  CARDIOVASCULAR:  Examination of carotid arteries is normal; no carotid bruits  Regular rate and rhythm, no murmurs  Examination of peripheral vascular system by observation and palpation is normal  EYES:  Ophthalmoscopic exam of optic discs and posterior segments is normal; no papilledema or hemorrhages No exam data present  MUSCULOSKELETAL:  Gait, strength, tone, movements noted in Neurologic exam below  NEUROLOGIC: MENTAL STATUS:  MMSE - [place] Exam 09/08/2019 03/10/2019 02/26/2019  Orientation to time '5 4 5  ' Orientation to Place '2 4 5  ' Registration '3 3 3  ' Attention/ Calculation '5 1 5  ' Recall '3 2 3  ' Language- name 2 objects 2 2 -  Language- repeat '1 1 1  ' Language- follow 3 step command 3 3 -  Language- read & follow direction 1 1 -  Write a sentence 1 1 -  Copy design 1 0 -  Total score 27 22 -    awake, alert, oriented to person  [facility] memory   DECR attention and concentration  language fluent, comprehension intact, naming intact  fund of knowledge appropriate  CRANIAL NERVE:   2nd - no papilledema on fundoscopic exam  2nd, 3rd, 4th, 6th - pupils equal and reactive to light, visual fields full to confrontation, extraocular muscles intact, no nystagmus  5th - facial  sensation symmetric  7th - facial strength symmetric  8th - hearing intact  9th - palate elevates symmetrically, uvula midline  11th - shoulder shrug symmetric  12th - tongue protrusion midline  MOTOR:   normal bulk and tone, full strength in  the BUE, BLE  SENSORY:   normal and symmetric to light touch, temperature, vibration  COORDINATION:   finger-nose-finger, fine finger movements normal  REFLEXES:   deep tendon reflexes TRACE and symmetric  GAIT/STATION:   narrow based gait; USING WALKER     DIAGNOSTIC DATA (LABS, IMAGING, TESTING) - I reviewed patient records, labs, notes, testing and imaging myself where available.  Lab Results  Component Value Date   WBC 11.3 (H) 07/12/2020   HGB 10.2 (L) 07/12/2020   HCT 31.9 (L) 07/12/2020   MCV 92.0 07/12/2020   PLT 203.0 07/12/2020      Component Value Date/Time   NA 137 07/12/2020 1711   K 4.5 07/12/2020 1711   CL 104 07/12/2020 1711   CO2 23 07/12/2020 1711   GLUCOSE 226 (H) 07/12/2020 1711   BUN 30 (H) 07/12/2020 1711   CREATININE 1.28 (H) 07/12/2020 1711   CALCIUM 8.5 07/12/2020 1711   PROT 5.3 (L) 06/13/2020 0537   ALBUMIN 2.3 (L) 06/13/2020 0537   AST 21 06/13/2020 0537   ALT 22 06/13/2020 0537   ALKPHOS 156 (H) 06/13/2020 0537   BILITOT 0.7 06/13/2020 0537   GFRNONAA 31 (L) 06/24/2020 0534   GFRAA 46 (L) 03/06/2019 0312   Lab Results  Component Value Date   CHOL 118 02/12/2020   HDL 32.30 (L) 02/12/2020   LDLCALC 60 02/12/2020   LDLDIRECT 103.0 02/18/2018   TRIG 127.0 02/12/2020   CHOLHDL 4 02/12/2020   Lab Results  Component Value Date   HGBA1C 7.9 (H) 06/02/2020   Lab Results  Component Value Date   VITAMINB12 461 06/06/2020   Lab Results  Component Value Date   TSH 1.712 06/02/2020    03/06/19 EEG  - This study is suggestive of moderate to severe diffuse encephalopathy, likely secondary to sedated state.  No seizures or epileptiform discharges were seen throughout the recording.  03/06/19 MRI brain [I reviewed images myself and agree with interpretation. Moderate-severe atrophy. Mild chronic small vessel ischemic disease. -VRP]  1. No acute finding. 2. Advanced cerebral atrophy.   ASSESSMENT AND PLAN  87 y.o. year old  female here with:  Dx:  1. Seizures ([place])   2. Memory loss   3. Cerebral thrombosis with cerebral infarction      PLAN:  STROKE  ASA 334m and and Plavix 75 mg daily for 3 months and then plavix alone (starting September 02, 2020) given severe intracranial stenosis  Atorvastatin 450mdaily  DM, BP control  SEIZURE (? due to mild dementia + stroke) - continue levetiracetam 75036mwice a day   MEMORY LOSS (mild dementia) - safety / supervision issues reviewed - caregiver resources provided - no driving; caution with finances, medications and living alone  Meds ordered this encounter  Medications  . clopidogrel (PLAVIX) 75 MG tablet    Sig: Take 1 tablet (75 mg total) by mouth daily.    Dispense:  90 tablet    Refill:  4  . levETIRAcetam (KEPPRA) 750 MG tablet    Sig: Take 1 tablet (750 mg total) by mouth 2 (two) times daily.    Dispense:  180 tablet    Refill:  4   Return in about 1 year (around 07/25/2021) for  with NP (Amy Lomax).    Penni Bombard, MD 0/99/8338, 2:50 PM Certified in Neurology, Neurophysiology and Neuroimaging  [facility] [address] [place], [place] [address] [phone]

## 2020-07-25 NOTE — Patient Instructions (Signed)
-   stop aspirin on 09/02/20  - continue plavix 75mg  daily long term  - continue levetiracetam 750mg  twice a day

## 2020-07-25 NOTE — Telephone Encounter (Signed)
Refilled. We can discuss protonix dosing at upcoming OV.  I did want them to schedule lab visit this week to recheck kidney function after recently starting losartan.  Also, how are BP readings?  I never got new wound care order request. What wound does she have? I don't remember seeing a wound at [facility].

## 2020-07-25 NOTE — Telephone Encounter (Signed)
Spoke with [name] who stated that the patients blood pressure seems to be doing better, but she will bring a copy of the readings in on Friday when she brings the patient for her lab appointment. Patients daughter stated that she has two wounds, one on her sacrum and one on her heel (daughter cannot remember which one). Daughter stated that the [facility] RN is caring for those and requesting dressings for them.

## 2020-07-27 NOTE — Telephone Encounter (Signed)
Lvm asking [name]  to call back.  Need to inform him we still have not received faxed wound care orders for pt.  Per [name], he can try emailing them to [email or web address] and she will print them for Dr. Darnell Level to sign.

## 2020-07-28 ENCOUNTER — Other Ambulatory Visit: Payer: Self-pay | Admitting: Family Medicine

## 2020-07-28 DIAGNOSIS — N1832 Chronic kidney disease, stage 3b: Secondary | ICD-10-CM

## 2020-07-28 NOTE — Telephone Encounter (Signed)
Lvm asking [name] James to call back.  Need to inform him we still have not received faxed wound care orders for pt.  Per [name], he can try emailing them to [email or web address] and she will print them for Dr. Darnell Level to sign.

## 2020-07-29 ENCOUNTER — Ambulatory Visit: Payer: Medicare Other | Admitting: Primary Care

## 2020-07-29 ENCOUNTER — Encounter: Payer: Self-pay | Admitting: Family Medicine

## 2020-07-29 ENCOUNTER — Other Ambulatory Visit: Payer: Medicare Other

## 2020-07-29 ENCOUNTER — Ambulatory Visit (INDEPENDENT_AMBULATORY_CARE_PROVIDER_SITE_OTHER): Payer: Medicare Other | Admitting: Family Medicine

## 2020-07-29 ENCOUNTER — Telehealth: Payer: Self-pay

## 2020-07-29 ENCOUNTER — Other Ambulatory Visit: Payer: Self-pay

## 2020-07-29 VITALS — BP 132/68 | HR 68 | Temp 97.6°F | Ht 60.0 in | Wt 105.1 lb

## 2020-07-29 DIAGNOSIS — I10 Essential (primary) hypertension: Secondary | ICD-10-CM

## 2020-07-29 DIAGNOSIS — L304 Erythema intertrigo: Secondary | ICD-10-CM

## 2020-07-29 DIAGNOSIS — L89620 Pressure ulcer of left heel, unstageable: Secondary | ICD-10-CM

## 2020-07-29 DIAGNOSIS — N1832 Chronic kidney disease, stage 3b: Secondary | ICD-10-CM | POA: Diagnosis not present

## 2020-07-29 LAB — CBC WITH DIFFERENTIAL/PLATELET
Basophils Absolute: 0.1 10*3/uL (ref 0.0–0.1)
Basophils Relative: 1.2 % (ref 0.0–3.0)
Eosinophils Absolute: 0.2 10*3/uL (ref 0.0–0.7)
Eosinophils Relative: 2.4 % (ref 0.0–5.0)
HCT: 34.6 % — ABNORMAL LOW (ref 36.0–46.0)
Hemoglobin: 11.3 g/dL — ABNORMAL LOW (ref 12.0–15.0)
Lymphocytes Relative: 20 % (ref 12.0–46.0)
Lymphs Abs: 1.6 10*3/uL (ref 0.7–4.0)
MCHC: 32.7 g/dL (ref 30.0–36.0)
MCV: 90.6 fl (ref 78.0–100.0)
Monocytes Absolute: 0.6 10*3/uL (ref 0.1–1.0)
Monocytes Relative: 8.1 % (ref 3.0–12.0)
Neutro Abs: 5.3 10*3/uL (ref 1.4–7.7)
Neutrophils Relative %: 68.3 % (ref 43.0–77.0)
Platelets: 190 10*3/uL (ref 150.0–400.0)
RBC: 3.82 Mil/uL — ABNORMAL LOW (ref 3.87–5.11)
RDW: 17.7 % — ABNORMAL HIGH (ref 11.5–15.5)
WBC: 7.8 10*3/uL (ref 4.0–10.5)

## 2020-07-29 LAB — RENAL FUNCTION PANEL
Albumin: 3.5 g/dL (ref 3.5–5.2)
BUN: 24 mg/dL — ABNORMAL HIGH (ref 6–23)
CO2: 27 mEq/L (ref 19–32)
Calcium: 9.7 mg/dL (ref 8.4–10.5)
Chloride: 103 mEq/L (ref 96–112)
Creatinine, Ser: 1.17 mg/dL (ref 0.40–1.20)
GFR: 42.38 mL/min — ABNORMAL LOW (ref >60.00)
Glucose, Bld: 152 mg/dL — ABNORMAL HIGH (ref 70–99)
Phosphorus: 3.7 mg/dL (ref 2.3–4.6)
Potassium: 5 mEq/L (ref 3.5–5.1)
Sodium: 139 mEq/L (ref 135–145)

## 2020-07-29 MED ORDER — NYSTATIN 100000 UNIT/GM EX CREA: 1.0000 "application " | TOPICAL_CREAM | Freq: Two times a day (BID) | CUTANEOUS | 0 refills | Status: AC

## 2020-07-29 NOTE — Patient Instructions (Addendum)
Labs today  For breast rash likely intertrigo - treat with nystatin cream sent to pharmacy. Let us know if not improving with this.  For heel wound - ensure heel stays elevated when legs are up. Keep legs up as able. Use foam gauze to dress wound daily. Let us know if streaking redness or draining pus.  I will also refer you to wound clinic. In the meantime I will also ask wound care home health nurse to do dressing changes at home.   Intertrigo Intertrigo is skin irritation or inflammation (dermatitis) that occurs when folds of skin rub together. The irritation can cause a rash and make skin raw and itchy. This condition most commonly occurs in the skin folds of these areas:  Toes.  Armpits.  Groin.  Under the belly.  Under the breasts.  Buttocks. Intertrigo is not passed from person to person (is not contagious). What are the causes? This condition is caused by heat, moisture, rubbing (friction), and not enough air circulation. The condition can be made worse by:  Sweat.  Bacteria.  A fungus, such as yeast. What increases the risk? This condition is more likely to occur if you have moisture in your skin folds. You are more likely to develop this condition if you:  Have diabetes.  Are overweight.  Are not able to move around or are not active.  Live in a warm and moist climate.  Wear splints, braces, or other medical devices.  Are not able to control your bowels or bladder (have incontinence). What are the signs or symptoms? Symptoms of this condition include:  A pink or red skin rash in the skin fold or near the skin fold.  Raw or scaly skin.  Itchiness.  A burning feeling.  Bleeding.  Leaking fluid.  A bad smell. How is this diagnosed? This condition is diagnosed with a medical history and physical exam. You may also have a skin swab to test for bacteria or a fungus. How is this treated? This condition may be treated by:  Cleaning and drying your  skin.  Taking an antibiotic medicine or using an antibiotic skin cream for a bacterial infection.  Using an antifungal cream on your skin or taking pills for an infection that was caused by a fungus, such as yeast.  Using a steroid ointment to relieve itchiness and irritation.  Separating the skin fold with a clean cotton cloth to absorb moisture and allow air to flow into the area. Follow these instructions at home:  Keep the affected area clean and dry.  Do not scratch your skin.  Stay in a cool environment as much as possible. Use an air conditioner or fan, if available.  Apply over-the-counter and prescription medicines only as told by your health care provider.  If you were prescribed an antibiotic medicine, use it as told by your health care provider. Do not stop using the antibiotic even if your condition improves.  Keep all follow-up visits as told by your health care provider. This is important. How is this prevented?  Maintain a healthy weight.  Take care of your feet, especially if you have diabetes. Foot care includes: ? Wearing shoes that fit well. ? Keeping your feet dry. ? Wearing clean, breathable socks.  Protect the skin around your groin and buttocks, especially if you have incontinence. Skin protection includes: ? Following a regular cleaning routine. ? Using skin protectant creams, powders, or ointments. ? Changing protection pads frequently.  Do not wear tight clothes.  Wear clothes that are loose, absorbent, and made of cotton.  Wear a bra that gives good support, if needed.  Shower and dry yourself well after activity or exercise. Use a hair dryer on a cool setting to dry between skin folds, especially after you bathe.  If you have diabetes, keep your blood sugar under control.   Contact a health care provider if:  Your symptoms do not improve with treatment.  Your symptoms get worse or they spread.  You notice increased redness and  warmth.  You have a fever. Summary  Intertrigo is skin irritation or inflammation (dermatitis) that occurs when folds of skin rub together.  This condition is caused by heat, moisture, rubbing (friction), and not enough air circulation.  This condition may be treated by cleaning and drying your skin and with medicines.  Apply over-the-counter and prescription medicines only as told by your health care provider.  Keep all follow-up visits as told by your health care provider. This is important. This information is not intended to replace advice given to you by your health care provider. Make sure you discuss any questions you have with your health care provider. Document Revised: 10/21/2017 Document Reviewed: 10/21/2017 Elsevier Patient Education  2021 [employer].

## 2020-07-29 NOTE — Telephone Encounter (Signed)
[name] - Client Nonclinical Telephone Record AccessNurse Client [facility] Night - Client Client Site [place] - Night Contact Type Call Who Is Calling Patient / Member / Family / Caregiver Caller Name Walden Phone Number [phone] Patient Name Taylor Castaneda Patient DOB February 08, 1933 Call Type Message Only Information Provided Reason for Call Request to Reschedule Office Appointment Initial Comment Caller states that she wants to see her doctor at 10am instead of 11am. Declined triage. Disp. Time Disposition Final User 07/29/2020 7:50:49 AM General Information Provided Yes Windy Canny Call Closed By: Windy Canny Transaction Date/Time: 07/29/2020 7:48:38 AM (ET)

## 2020-07-29 NOTE — Telephone Encounter (Signed)
Patient has appointment at 10:00.

## 2020-07-29 NOTE — Progress Notes (Signed)
Patient ID: Taylor Castaneda, female    DOB: 11-Dec-1932, 87 y.o.   MRN: [id number]  This visit was conducted in person.  BP 132/68   Pulse 68   Temp 97.6 F (36.4 C) (Temporal)   Ht 5' (1.524 m)   Wt 105 lb 1 oz (47.7 kg)   SpO2 100%   BMI 20.52 kg/m    CC: wound check, breast rash  Subjective:   HPI: Taylor Castaneda is a 87 y.o. female presenting on 07/29/2020 for Wound Check (C/o sore on right heel.  Noticed 3-4 wks ago.  Treated by [facility] nurse.  Pt accompanied by daughter, [name]- temp 97.8.) and Rash (C/o rash under left breast, worsening.  Noticed about 1 wk ago. )   See prior note for details.  BP better controlled since starting losartan - check Cr today. Brings BP log showing BP 108-160s/70-80s, HR 60-70s. Tolerating medicine.   Pressure sores - on sacrum and L heel. Sacral wound largely improved. [facility] requesting orders for heel wound.   Over the past week she has developed red rash under left breast - enlarging      Relevant past medical, surgical, family and social history reviewed and updated as indicated. Interim medical history since our last visit reviewed. Allergies and medications reviewed and updated. Outpatient Medications Prior to Visit  Medication Sig Dispense Refill  . acetaminophen (TYLENOL) 325 MG tablet Take 325-650 mg by mouth every 6 (six) hours as needed (for pain).    Marland Kitchen aspirin EC 325 MG EC tablet Take 1 tablet (325 mg total) by mouth daily. 30 tablet 0  . atorvastatin (LIPITOR) 40 MG tablet Take 1 tablet (40 mg total) by mouth daily at 6 PM. 90 tablet 3  . Carboxymethylcellul-Glycerin 1-0.25 % SOLN Place 1-2 drops into both eyes 3 (three) times daily as needed (for dry/irritated eyes.).    Marland Kitchen carvedilol (COREG) 12.5 MG tablet Take 1 tablet (12.5 mg total) by mouth 2 (two) times daily. 180 tablet 3  . Cholecalciferol (VITAMIN D3) 25 MCG (1000 UT) CAPS Take 1 capsule (1,000 Units total) by mouth daily. 30 capsule 0  . Cinnamon 500 MG capsule Take  1,000 mg by mouth daily.    . clopidogrel (PLAVIX) 75 MG tablet Take 1 tablet (75 mg total) by mouth daily. 90 tablet 4  . colchicine 0.6 MG tablet Take 1 tablet (0.6 mg total) by mouth daily as needed (gout flare). First day of gout flare, may take 1 tablet twice daily. 30 tablet 3  . CONTOUR NEXT TEST test strip 1 each 4 (four) times daily.    . diclofenac Sodium (VOLTAREN) 1 % GEL Apply 2 g topically 4 (four) times daily. 2 g 0  . Ferrous Sulfate (IRON) 325 (65 Fe) MG TABS Take 1 tablet (325 mg total) by mouth See admin instructions. Take 325 mg by mouth in the evening on Mondays and Thursdays only 30 tablet 0  . insulin glargine (LANTUS) 100 UNIT/ML Solostar Pen Inject 8 Units into the skin daily. 15 mL 11  . levETIRAcetam (KEPPRA) 750 MG tablet Take 1 tablet (750 mg total) by mouth 2 (two) times daily. 180 tablet 4  . losartan (COZAAR) 25 MG tablet Take 1 tablet (25 mg total) by mouth daily. 30 tablet 6  . Multiple Vitamins-Minerals (PRESERVISION AREDS 2 PO) Take 1 capsule by mouth 2 (two) times daily.    . pantoprazole (PROTONIX) 40 MG tablet Take 1 tablet (40 mg total) by mouth  2 (two) times daily. 60 tablet 1  . Blood Glucose Monitoring Suppl (FREESTYLE INSULINX SYSTEM) w/Device KIT Glucose testing     No facility-administered medications prior to visit.     Per HPI unless specifically indicated in ROS section below Review of Systems Objective:  BP 132/68   Pulse 68   Temp 97.6 F (36.4 C) (Temporal)   Ht 5' (1.524 m)   Wt 105 lb 1 oz (47.7 kg)   SpO2 100%   BMI 20.52 kg/m   Wt Readings from Last 3 Encounters:  07/29/20 105 lb 1 oz (47.7 kg)  07/25/20 108 lb (49 kg)  07/12/20 108 lb 6 oz (49.2 kg)      Physical Exam Vitals and nursing note reviewed.  Constitutional:      Appearance: Normal appearance. She is not ill-appearing.  Musculoskeletal:     Right lower leg: No edema.     Left lower leg: No edema.  Skin:    General: Skin is warm and dry.     Findings: Lesion,  rash and wound present.          Comments:  Erythematous maculopapular rash below left breast with central clearing  Erythema without skin opening to L sacral region L heel with dry pressure sore 3x2cm in size with eschar formation with blistering of skin lateral to wound, scaly skin bilateral heels   Neurological:     Mental Status: She is alert.  Psychiatric:        Mood and Affect: Mood normal.        Behavior: Behavior normal.       Results for orders placed or performed in visit on 07/12/20  Sedimentation rate  Result Value Ref Range   Sed Rate 80 (H) 0 - 30 mm/hr  CBC with Differential/Platelet  Result Value Ref Range   WBC 11.3 (H) 4.0 - 10.5 K/uL   RBC 3.47 (L) 3.87 - 5.11 Mil/uL   Hemoglobin 10.2 (L) 12.0 - 15.0 g/dL   HCT 31.9 (L) 36.0 - 46.0 %   MCV 92.0 78.0 - 100.0 fl   MCHC 32.1 30.0 - 36.0 g/dL   RDW 16.4 (H) 11.5 - 15.5 %   Platelets 203.0 150.0 - 400.0 K/uL   Neutrophils Relative % 67.9 43.0 - 77.0 %   Lymphocytes Relative 18.2 12.0 - 46.0 %   Monocytes Relative 12.0 3.0 - 12.0 %   Eosinophils Relative 1.0 0.0 - 5.0 %   Basophils Relative 0.9 0.0 - 3.0 %   Neutro Abs 7.7 1.4 - 7.7 K/uL   Lymphs Abs 2.1 0.7 - 4.0 K/uL   Monocytes Absolute 1.4 (H) 0.1 - 1.0 K/uL   Eosinophils Absolute 0.1 0.0 - 0.7 K/uL   Basophils Absolute 0.1 0.0 - 0.1 K/uL  Basic metabolic panel  Result Value Ref Range   Sodium 137 135 - 145 mEq/L   Potassium 4.5 3.5 - 5.1 mEq/L   Chloride 104 96 - 112 mEq/L   CO2 23 19 - 32 mEq/L   Glucose, Bld 226 (H) 70 - 99 mg/dL   BUN 30 (H) 6 - 23 mg/dL   Creatinine, Ser 1.28 (H) 0.40 - 1.20 mg/dL   GFR 38.06 (L) >60.00 mL/min   Calcium 8.5 8.4 - 10.5 mg/dL  Uric acid  Result Value Ref Range   Uric Acid, Serum 6.9 2.4 - 7.0 mg/dL  C-reactive protein  Result Value Ref Range   CRP 9.3 0.5 - 20.0 mg/dL   Assessment & Plan:  This visit occurred during the SARS-CoV-2 public health emergency.  Safety protocols were in place, including  screening questions prior to the visit, additional usage of staff PPE, and extensive cleaning of exam room while observing appropriate contact time as indicated for disinfecting solutions.   Problem List Items Addressed This Visit    Pressure injury of left heel, unstageable ([place]) - Primary    Refer to wound care as likely will need debridement.  In interim I have asked home health wound care out for possible foam dressing change and ensure pressure is staying off heel.  No signs of infection at this time.       Relevant Orders   Ambulatory referral to [facility]   Ambulatory referral to Home Health   Intertrigo    Anticipate candidal intertrigo below L breast  Discussed home care Rx nystatin cream BID x 2-3 wks, update if not improved with this.       Essential hypertension    Improved BP control with addition of losartan 15m. Will continue to monitor on low dose ARB with option to increase dose if needed.       CKD (chronic kidney disease), stage III ([place]       Meds ordered this encounter  Medications  . nystatin cream (MYCOSTATIN)    Sig: Apply 1 application topically 2 (two) times daily.    Dispense:  80 g    Refill:  0   Orders Placed This Encounter  Procedures  . Ambulatory referral to Wound Clinic    Referral Priority:   Routine    Referral Type:   Consultation    Referral Reason:   Specialty Services Required    Requested Specialty:   Wound Care    Number of Visits Requested:   1  . Ambulatory referral to Home Health    Referral Priority:   Routine    Referral Type:   Home Health Care    Referral Reason:   Specialty Services Required    Requested Specialty:   [facility]   Number of Visits Requested:   1    Patient instructions: Labs today  For breast rash likely intertrigo - treat with nystatin cream sent to pharmacy. Let [place] if not improving with this.  For heel wound - ensure heel stays elevated when legs are up. Keep legs up as able.  Use foam gauze to dress wound daily. Let [place] if streaking redness or draining pus.  I will also refer you to wound clinic. In the meantime I will also ask wound care home health nurse to do dressing changes at home.   Follow up plan: Return if symptoms worsen or fail to improve.  JRia Bush MD

## 2020-07-29 NOTE — Assessment & Plan Note (Signed)
Refer to wound care as likely will need debridement.  In interim I have asked home health wound care out for possible foam dressing change and ensure pressure is staying off heel.  No signs of infection at this time.

## 2020-07-29 NOTE — Assessment & Plan Note (Signed)
Improved BP control with addition of losartan 25mg . Will continue to monitor on low dose ARB with option to increase dose if needed.

## 2020-07-29 NOTE — Assessment & Plan Note (Signed)
Anticipate candidal intertrigo below L breast  Discussed home care Rx nystatin cream BID x 2-3 wks, update if not improved with this.

## 2020-08-01 DIAGNOSIS — Z85038 Personal history of other malignant neoplasm of large intestine: Secondary | ICD-10-CM | POA: Diagnosis not present

## 2020-08-01 DIAGNOSIS — I10 Essential (primary) hypertension: Secondary | ICD-10-CM | POA: Diagnosis not present

## 2020-08-01 DIAGNOSIS — E785 Hyperlipidemia, unspecified: Secondary | ICD-10-CM | POA: Diagnosis not present

## 2020-08-01 DIAGNOSIS — M47812 Spondylosis without myelopathy or radiculopathy, cervical region: Secondary | ICD-10-CM | POA: Diagnosis not present

## 2020-08-01 DIAGNOSIS — I69320 Aphasia following cerebral infarction: Secondary | ICD-10-CM | POA: Diagnosis not present

## 2020-08-01 DIAGNOSIS — H409 Unspecified glaucoma: Secondary | ICD-10-CM | POA: Diagnosis not present

## 2020-08-01 DIAGNOSIS — I700 Atherosclerosis of aorta: Secondary | ICD-10-CM | POA: Diagnosis not present

## 2020-08-01 DIAGNOSIS — Z7902 Long term (current) use of antithrombotics/antiplatelets: Secondary | ICD-10-CM | POA: Diagnosis not present

## 2020-08-01 DIAGNOSIS — Z7982 Long term (current) use of aspirin: Secondary | ICD-10-CM | POA: Diagnosis not present

## 2020-08-01 DIAGNOSIS — G40909 Epilepsy, unspecified, not intractable, without status epilepticus: Secondary | ICD-10-CM | POA: Diagnosis not present

## 2020-08-01 DIAGNOSIS — D649 Anemia, unspecified: Secondary | ICD-10-CM | POA: Diagnosis not present

## 2020-08-01 DIAGNOSIS — M8580 Other specified disorders of bone density and structure, unspecified site: Secondary | ICD-10-CM | POA: Diagnosis not present

## 2020-08-01 DIAGNOSIS — Z9181 History of falling: Secondary | ICD-10-CM | POA: Diagnosis not present

## 2020-08-01 DIAGNOSIS — E1139 Type 2 diabetes mellitus with other diabetic ophthalmic complication: Secondary | ICD-10-CM | POA: Diagnosis not present

## 2020-08-01 NOTE — Telephone Encounter (Signed)
Pt seen for OV on 07/29/20.

## 2020-08-03 ENCOUNTER — Encounter: Payer: Medicare Other | Attending: Internal Medicine | Admitting: Internal Medicine

## 2020-08-03 ENCOUNTER — Other Ambulatory Visit: Payer: Self-pay

## 2020-08-03 DIAGNOSIS — G40909 Epilepsy, unspecified, not intractable, without status epilepticus: Secondary | ICD-10-CM | POA: Diagnosis not present

## 2020-08-03 DIAGNOSIS — Z8673 Personal history of transient ischemic attack (TIA), and cerebral infarction without residual deficits: Secondary | ICD-10-CM | POA: Diagnosis not present

## 2020-08-03 DIAGNOSIS — N1832 Chronic kidney disease, stage 3b: Secondary | ICD-10-CM | POA: Diagnosis not present

## 2020-08-03 DIAGNOSIS — E1122 Type 2 diabetes mellitus with diabetic chronic kidney disease: Secondary | ICD-10-CM | POA: Diagnosis not present

## 2020-08-03 DIAGNOSIS — I129 Hypertensive chronic kidney disease with stage 1 through stage 4 chronic kidney disease, or unspecified chronic kidney disease: Secondary | ICD-10-CM | POA: Insufficient documentation

## 2020-08-03 DIAGNOSIS — E11621 Type 2 diabetes mellitus with foot ulcer: Secondary | ICD-10-CM | POA: Insufficient documentation

## 2020-08-03 DIAGNOSIS — L89620 Pressure ulcer of left heel, unstageable: Secondary | ICD-10-CM | POA: Diagnosis not present

## 2020-08-03 DIAGNOSIS — E1151 Type 2 diabetes mellitus with diabetic peripheral angiopathy without gangrene: Secondary | ICD-10-CM | POA: Insufficient documentation

## 2020-08-03 DIAGNOSIS — L89150 Pressure ulcer of sacral region, unstageable: Secondary | ICD-10-CM | POA: Diagnosis not present

## 2020-08-04 NOTE — Progress Notes (Signed)
RENAI, LOPATA ([id number]) Visit Report for 08/03/2020 Allergy List Details Patient Name: Taylor Castaneda, Taylor Castaneda. Date of Service: 08/03/2020 1:00 PM Medical Record Number: [id number] Patient Account Number: [id number] Date of Birth/Sex: 05/17/1933 (87 y.o. F) Treating RN: Cornell Barman Primary Care Ciena Sampley: [name] Other Clinician: Jeanine Luz Referring Donley Harland: [name] Treating Tiona Ruane/Extender: Ricard Dillon Weeks in Treatment: 0 Allergies Active Allergies No Known Allergies Allergy Notes Electronic Signature(s) Signed: 08/04/2020 1:24:27 PM By: Jeanine Luz Entered By: Jeanine Luz on 08/03/2020 13:18:04 Taylor Castaneda ([id number]) -------------------------------------------------------------------------------- Arrival Information Details Patient Name: Taylor Castaneda Date of Service: 08/03/2020 1:00 PM Medical Record Number: [id number] Patient Account Number: [id number] Date of Birth/Sex: Oct 04, 1932 (87 y.o. F) Treating RN: Cornell Barman Primary Care Welborn Keena: [name] Other Clinician: Jeanine Luz Referring Lessa Huge: [name] Treating Noely Kuhnle/Extender: Tito Dine in Treatment: 0 Visit Information Patient Arrived: Wheel Chair Arrival Time: 13:13 Accompanied By: son Transfer Assistance: EasyPivot Patient Lift Patient Identification Verified: Yes Secondary Verification Process Completed: Yes Patient Has Alerts: Yes Patient Alerts: Patient on Blood Thinner Type I Diabetic Electronic Signature(s) Signed: 08/04/2020 1:24:27 PM By: Jeanine Luz Entered By: Jeanine Luz on 08/03/2020 13:14:23 Taylor Castaneda ([id number]) -------------------------------------------------------------------------------- Clinic Level of Care Assessment Details Patient Name: Taylor Castaneda Date of Service: 08/03/2020 1:00 PM Medical Record Number: [id number] Patient Account Number: [id number] Date of Birth/Sex: [birth year] (87 y.o.  F) Treating RN: Cornell Barman Primary Care England Greb: [name] Other Clinician: Jeanine Luz Referring Trevionne Advani: [name] Treating Saif Peter/Extender: Tito Dine in Treatment: 0 Clinic Level of Care Assessment Items TOOL 1 Quantity Score []  - Use when EandM and Procedure is performed on INITIAL visit 0 ASSESSMENTS - Nursing Assessment / Reassessment X - General Physical Exam (combine w/ comprehensive assessment (listed just below) when performed on new 1 20 pt. evals) X- 1 25 Comprehensive Assessment (HX, ROS, Risk Assessments, Wounds Hx, etc.) ASSESSMENTS - Wound and Skin Assessment / Reassessment []  - Dermatologic / Skin Assessment (not related to wound area) 0 ASSESSMENTS - Ostomy and/or Continence Assessment and Care []  - Incontinence Assessment and Management 0 []  - 0 Ostomy Care Assessment and Management (repouching, etc.) PROCESS - Coordination of Care X - Simple Patient / Family Education for ongoing care 1 15 []  - 0 Complex (extensive) Patient / Family Education for ongoing care X- 1 10 Staff obtains Consents, Records, Test Results / Process Orders []  - 0 Staff telephones HHA, Nursing Homes / Clarify orders / etc []  - 0 Routine Transfer to another Facility (non-emergent condition) []  - 0 Routine Hospital Admission (non-emergent condition) X- 1 15 New Admissions / Biomedical engineer / Ordering NPWT, Apligraf, etc. []  - 0 Emergency Hospital Admission (emergent condition) PROCESS - Special Needs []  - Pediatric / Minor Patient Management 0 []  - 0 Isolation Patient Management []  - 0 Hearing / Language / Visual special needs []  - 0 Assessment of Community assistance (transportation, D/C planning, etc.) []  - 0 Additional assistance / Altered mentation []  - 0 Support Surface(s) Assessment (bed, cushion, seat, etc.) INTERVENTIONS - Miscellaneous []  - External ear exam 0 []  - 0 Patient Transfer (multiple staff / Civil Service fast streamer /  Similar devices) []  - 0 Simple Staple / Suture removal (25 or less) []  - 0 Complex Staple / Suture removal (26 or more) []  - 0 Hypo/Hyperglycemic Management (do not check if billed separately) X- 1 15 Ankle / Brachial Index (ABI) - do not check if billed separately Has the patient been seen at the  hospital within the last three years: Yes Total Score: 100 Level Of Care: New/Established - Level 3 Taylor Castaneda, Taylor Castaneda ([id number]) Electronic Signature(s) Signed: 08/03/2020 5:56:34 PM By: Gretta Cool, BSN, RN, CWS, Kim RN, BSN Entered By: Gretta Cool, BSN, RN, CWS, Kim on 08/03/2020 14:18:48 Taylor Castaneda ([id number]) -------------------------------------------------------------------------------- Encounter Discharge Information Details Patient Name: Taylor Castaneda Date of Service: 08/03/2020 1:00 PM Medical Record Number: [id number] Patient Account Number: [id number] Date of Birth/Sex: 01-Feb-1933 (87 y.o. F) Treating RN: Cornell Barman Primary Care Daouda Lonzo: [name] Other Clinician: Jeanine Luz Referring Kaicee Scarpino: [name] Treating Yosef Krogh/Extender: Tito Dine in Treatment: 0 Encounter Discharge Information Items Post Procedure Vitals Discharge Condition: Stable Temperature (F): 97.8 Ambulatory Status: Wheelchair Pulse (bpm): 71 Discharge Destination: Home Respiratory Rate (breaths/min): 18 Transportation: Private Auto Blood Pressure (mmHg): 168/80 Accompanied By: son Schedule Follow-up Appointment: Yes Clinical Summary of Care: Electronic Signature(s) Signed: 08/03/2020 5:56:34 PM By: Gretta Cool, BSN, RN, CWS, Kim RN, BSN Entered By: Gretta Cool, BSN, RN, CWS, Kim on 08/03/2020 14:22:09 Taylor Castaneda ([id number]) -------------------------------------------------------------------------------- Lower Extremity Assessment Details Patient Name: Taylor Castaneda Date of Service: 08/03/2020 1:00 PM Medical Record Number: [id number] Patient Account Number: [id number] Date  of Birth/Sex: Aug 20, 1932 (87 y.o. F) Treating RN: Cornell Barman Primary Care Geriann Lafont: [name] Other Clinician: Jeanine Luz Referring Ireoluwa Gorsline: [name] Treating Ayyan Sites/Extender: Tito Dine in Treatment: 0 Edema Assessment Assessed: [Left: No] [Right: No] [Left: Edema] [Right: :] Calf Left: Right: Point of Measurement: 34 cm From Medial Instep 25 cm Ankle Left: Right: Point of Measurement: 8 cm From Medial Instep 19.6 cm Knee To Floor Left: Right: From Medial Instep 40 cm Vascular Assessment Pulses: Dorsalis Pedis Palpable: [Left:Yes] Doppler Audible: [Left:Yes] Blood Pressure: Brachial: [Left:170] Ankle: [Left:Dorsalis Pedis: 122 0.72] Electronic Signature(s) Signed: 08/03/2020 5:56:34 PM By: Gretta Cool, BSN, RN, CWS, Kim RN, BSN Signed: 08/04/2020 1:24:27 PM By: Jeanine Luz Entered By: Jeanine Luz on 08/03/2020 13:42:13 Taylor Castaneda ([id number]) -------------------------------------------------------------------------------- Multi Wound Chart Details Patient Name: Taylor Castaneda Date of Service: 08/03/2020 1:00 PM Medical Record Number: [id number] Patient Account Number: [id number] Date of Birth/Sex: March 24, 1933 (87 y.o. F) Treating RN: Cornell Barman Primary Care Glorian Mcdonell: [name] Other Clinician: Jeanine Luz Referring Markeria Goetsch: [name] Treating Teneshia Hedeen/Extender: Tito Dine in Treatment: 0 Vital Signs Height(in): 60 Pulse(bpm): 37 Weight(lbs): 105 Blood Pressure(mmHg): 168/80 Body Mass Index(BMI): 21 Temperature(F): 97.8 Respiratory Rate(breaths/min): 18 Photos: [N/A:N/A] Wound Location: Left Calcaneus N/A N/A Wounding Event: Gradually Appeared N/A N/A Primary Etiology: Pressure Ulcer N/A N/A Comorbid History: Cataracts, Hypertension, Myocardial N/A N/A Infarction, Peripheral Arterial Disease, Type I Diabetes, End Stage Renal Disease, Gout, Seizure Disorder Date Acquired: 06/22/2020  N/A N/A Weeks of Treatment: 0 N/A N/A Wound Status: Open N/A N/A Measurements L x W x D (cm) 2.5x2.7x0.1 N/A N/A Area (cm) : 5.301 N/A N/A Volume (cm) : 0.53 N/A N/A % Reduction in Area: 0.00% N/A N/A % Reduction in Volume: 0.00% N/A N/A Classification: Unstageable/Unclassified N/A N/A Exudate Amount: None Present N/A N/A Granulation Amount: None Present (0%) N/A N/A Necrotic Amount: Large (67-100%) N/A N/A Necrotic Tissue: Eschar N/A N/A Exposed Structures: Fascia: No N/A N/A Fat Layer (Subcutaneous Tissue): No Tendon: No Muscle: No Joint: No Bone: No Debridement: Debridement - Selective/Open N/A N/A Wound Pre-procedure Verification/Time 14:09 N/A N/A Out Taken: Tissue Debrided: Necrotic/Eschar N/A N/A Level: Non-Viable Tissue N/A N/A Debridement Area (sq cm): 6.75 N/A N/A Instrument: Curette N/A N/A Bleeding: Minimum N/A N/A Hemostasis Achieved: Pressure N/A N/A Debridement Treatment Procedure was  tolerated well N/A N/A Response: Post Debridement 2.5x2.7x0.1 N/A N/A Measurements L x W x D (cm) 0.53 N/A N/A ELNITA, SURPRENANT ([id number]) Post Debridement Volume: (cm) Post Debridement Stage: Unstageable/Unclassified N/A N/A Procedures Performed: Debridement N/A N/A Treatment Notes Wound #1 (Calcaneus) Wound Laterality: Left Cleanser Peri-Wound Care Topical Santyl Collagenase Ointment, 30 (gm), tube Discharge Instruction: apply nickel thick to wound bed only Primary Dressing Gauze Discharge Instruction: As directed: dry, moistened with saline or moistened with Dakins Solution Secondary Dressing Kerlix 4.5 x 4.1 (in/yd) Discharge Instruction: Wrapped lightly due to blood flow. Secured With 63M Medipore H Soft Cloth Surgical Tape, 2x2 (in/yd) Compression Wrap Compression Stockings Add-Ons Notes Manuka Honey if Santyl is too expensive. Electronic Signature(s) Signed: 08/03/2020 5:07:57 PM By: Linton Ham MD Entered By: Linton Ham on 08/03/2020  14:22:55 Taylor Castaneda ([id number]) -------------------------------------------------------------------------------- [place] Details Patient Name: Taylor Castaneda, Taylor Castaneda Date of Service: 08/03/2020 1:00 PM Medical Record Number: [id number] Patient Account Number: [id number] Date of Birth/Sex: [birth year] (87 y.o. F) Treating RN: Cornell Barman Primary Care Candia Kingsbury: [name] Other Clinician: Jeanine Luz Referring Ellery Meroney: [name] Treating Arlie Riker/Extender: Tito Dine in Treatment: 0 Active Inactive Medication Nursing Diagnoses: Knowledge deficit related to medication safety: actual or potential Goals: Patient/caregiver will demonstrate understanding of all current medications Date Initiated: 08/03/2020 Target Resolution Date: 08/17/2020 Goal Status: Active Patient/caregiver will demonstrate understanding of new oral/IV medications prescribed at the [facility] (topical prescriptions are covered under the skin breakdown problem) Date Initiated: 08/03/2020 Target Resolution Date: 08/17/2020 Goal Status: Active Interventions: Assess for medication contraindications each visit where new medications are prescribed Assess patient/caregiver ability to manage medication regimen upon admission and as needed Patient/Caregiver given reconciled medication list upon admission, changes in medications and discharge from the [facility] education on medication safety Treatment Activities: New medication prescribed at [place] : 08/03/2020 Notes: Necrotic Tissue Nursing Diagnoses: Impaired tissue integrity related to necrotic/devitalized tissue Knowledge deficit related to management of necrotic/devitalized tissue Goals: Necrotic/devitalized tissue will be minimized in the wound bed Date Initiated: 08/03/2020 Target Resolution Date: 08/17/2020 Goal Status: Active Patient/caregiver will verbalize understanding of reason and process for debridement of  necrotic tissue Date Initiated: 08/03/2020 Target Resolution Date: 08/17/2020 Goal Status: Active Interventions: Assess patient pain level pre-, during and post procedure and prior to discharge Provide education on necrotic tissue and debridement process Treatment Activities: Apply topical anesthetic as ordered : 08/03/2020 Excisional debridement : 08/03/2020 Notes: Orientation to the Wound Care Program Nursing Diagnoses: Knowledge deficit related to the wound healing center program Taylor Castaneda, Taylor Castaneda ([id number]) Goals: Patient/caregiver will verbalize understanding of the [facility] Date Initiated: 08/03/2020 Target Resolution Date: 08/17/2020 Goal Status: Active Interventions: Provide education on orientation to the wound center Notes: Wound/Skin Impairment Nursing Diagnoses: Impaired tissue integrity Knowledge deficit related to smoking impact on wound healing Knowledge deficit related to ulceration/compromised skin integrity Goals: Patient/caregiver will verbalize understanding of skin care regimen Date Initiated: 08/03/2020 Target Resolution Date: 08/17/2020 Goal Status: Active Ulcer/skin breakdown will have a volume reduction of 30% by week 4 Date Initiated: 08/03/2020 Target Resolution Date: 09/03/2020 Goal Status: Active Interventions: Assess patient/caregiver ability to obtain necessary supplies Assess patient/caregiver ability to perform ulcer/skin care regimen upon admission and as needed Assess ulceration(s) every visit Treatment Activities: Patient referred to home care : 08/03/2020 Skin care regimen initiated : 08/03/2020 Topical wound management initiated : 08/03/2020 Notes: Electronic Signature(s) Signed: 08/03/2020 5:56:34 PM By: Gretta Cool, BSN, RN, CWS, Kim RN, BSN Entered By: Gretta Cool, BSN,  RN, CWS, Kim on 08/03/2020 14:09:21 Taylor Castaneda, Taylor Castaneda ([id number]) -------------------------------------------------------------------------------- Pain Assessment  Details Patient Name: Taylor Castaneda, Taylor Castaneda Date of Service: 08/03/2020 1:00 PM Medical Record Number: [id number] Patient Account Number: [id number] Date of Birth/Sex: 1933-05-19 (87 y.o. F) Treating RN: Cornell Barman Primary Care Adamari Frede: [name] Other Clinician: Jeanine Luz Referring Senica Crall: [name] Treating Penelope Fittro/Extender: Tito Dine in Treatment: 0 Active Problems Location of Pain Severity and Description of Pain Patient Has Paino No Site Locations Pain Management and Medication Current Pain Management: Electronic Signature(s) Signed: 08/03/2020 5:56:34 PM By: Gretta Cool, BSN, RN, CWS, Kim RN, BSN Signed: 08/04/2020 1:24:27 PM By: Jeanine Luz Entered By: Jeanine Luz on 08/03/2020 13:16:31 Taylor Castaneda ([id number]) -------------------------------------------------------------------------------- Patient/Caregiver Education Details Patient Name: Taylor Castaneda Date of Service: 08/03/2020 1:00 PM Medical Record Number: [id number] Patient Account Number: [id number] Date of Birth/Gender: 18-May-1933 (87 y.o. F) Treating RN: Cornell Barman Primary Care Physician: [name] Other Clinician: Jeanine Luz Referring Physician: [name] Treating Physician/Extender: Tito Dine in Treatment: 0 Education Assessment Education Provided To: Patient Education Topics Provided Welcome To [facility]: Handouts: [facility] Methods: Demonstration, Explain/Verbal Responses: State content correctly Wound Debridement: Handouts: Wound Debridement Methods: Demonstration, Explain/Verbal Responses: State content correctly Wound/Skin Impairment: Handouts: Caring for Your Ulcer Methods: Demonstration, Explain/Verbal Responses: State content correctly Electronic Signature(s) Signed: 08/03/2020 5:56:34 PM By: Gretta Cool, BSN, RN, CWS, Kim RN, BSN Entered By: Gretta Cool, BSN, RN, CWS, Kim on 08/03/2020  14:19:20 Taylor Castaneda ([id number]) -------------------------------------------------------------------------------- Wound Assessment Details Patient Name: Taylor Castaneda Date of Service: 08/03/2020 1:00 PM Medical Record Number: [id number] Patient Account Number: [id number] Date of Birth/Sex: 04-04-1933 (87 y.o. F) Treating RN: Cornell Barman Primary Care Oluwanifemi Susman: [name] Other Clinician: Jeanine Luz Referring Meighan Treto: [name] Treating Delfino Friesen/Extender: Tito Dine in Treatment: 0 Wound Status Wound Number: 1 Primary Pressure Ulcer Etiology: Wound Location: Left Calcaneus Wound Open Wounding Event: Gradually Appeared Status: Date Acquired: 06/22/2020 Comorbid Cataracts, Hypertension, Myocardial Infarction, Peripheral Weeks Of Treatment: 0 History: Arterial Disease, Type I Diabetes, End Stage Renal Clustered Wound: No Disease, Gout, Seizure Disorder Photos Wound Measurements Length: (cm) 2.5 Width: (cm) 2.7 Depth: (cm) 0.1 Area: (cm) 5.301 Volume: (cm) 0.53 % Reduction in Area: 0% % Reduction in Volume: 0% Tunneling: No Undermining: No Wound Description Classification: Unstageable/Unclassified Exudate Amount: None Present Foul Odor After Cleansing: No Slough/Fibrino No Wound Bed Granulation Amount: None Present (0%) Exposed Structure Necrotic Amount: Large (67-100%) Fascia Exposed: No Necrotic Quality: Eschar Fat Layer (Subcutaneous Tissue) Exposed: No Tendon Exposed: No Muscle Exposed: No Joint Exposed: No Bone Exposed: No Treatment Notes Wound #1 (Calcaneus) Wound Laterality: Left Cleanser Peri-Wound Care Topical Santyl Collagenase Ointment, 30 (gm), tube Discharge Instruction: apply nickel thick to wound bed only Taylor Castaneda, Taylor Castaneda ([id number]) Primary Dressing Gauze Discharge Instruction: As directed: dry, moistened with saline or moistened with Dakins Solution Secondary Dressing Kerlix 4.5 x 4.1 (in/yd) Discharge  Instruction: Wrapped lightly due to blood flow. Secured With 11M Medipore H Soft Cloth Surgical Tape, 2x2 (in/yd) Compression Wrap Compression Stockings Add-Ons Notes Manuka Honey if Santyl is too expensive. Electronic Signature(s) Signed: 08/03/2020 5:56:34 PM By: Gretta Cool, BSN, RN, CWS, Kim RN, BSN Signed: 08/04/2020 1:24:27 PM By: Jeanine Luz Entered By: Jeanine Luz on 08/03/2020 13:34:51 Taylor Castaneda ([id number]) -------------------------------------------------------------------------------- Vitals Details Patient Name: Taylor Castaneda, Taylor Castaneda Date of Service: 08/03/2020 1:00 PM Medical Record Number: [id number] Patient Account Number: [id number] Date of Birth/Sex: Oct 20, 1932 (87 y.o. F) Treating  RN: Cornell Barman Primary Care Ila Landowski: [name] Other Clinician: Jeanine Luz Referring Oluwadara Gorman: [name] Treating Norma Ignasiak/Extender: Tito Dine in Treatment: 0 Vital Signs Time Taken: 13:15 Temperature (F): 97.8 Height (in): 60 Pulse (bpm): 71 Weight (lbs): 105 Respiratory Rate (breaths/min): 18 Source: Stated Blood Pressure (mmHg): 168/80 Body Mass Index (BMI): 20.5 Reference Range: 80 - 120 mg / dl Electronic Signature(s) Signed: 08/04/2020 1:24:27 PM By: Jeanine Luz Entered By: Jeanine Luz on 08/03/2020 13:17:28

## 2020-08-04 NOTE — Progress Notes (Signed)
BRUCHA, AHLQUIST ([id number]) Visit Report for 08/03/2020 Chief Complaint Document Details Patient Name: Taylor Castaneda, Taylor Castaneda. Date of Service: 08/03/2020 1:00 PM Medical Record Number: [id number] Patient Account Number: [id number] Date of Birth/Sex: November 08, 1932 (88 y.o. F) Treating RN: Cornell Barman Primary Care Provider: [name] Other Clinician: Jeanine Luz Referring Provider: [name] Treating Provider/Extender: Tito Dine in Treatment: 0 Information Obtained from: Patient Chief Complaint 08/03/2020; patient is here for review of a wound on her left Achilles heel Electronic Signature(s) Signed: 08/03/2020 5:07:57 PM By: Linton Ham MD Entered By: Linton Ham on 08/03/2020 14:23:20 Taylor Castaneda ([id number]) -------------------------------------------------------------------------------- Debridement Details Patient Name: Taylor Castaneda Date of Service: 08/03/2020 1:00 PM Medical Record Number: [id number] Patient Account Number: [id number] Date of Birth/Sex: 1933-03-12 (88 y.o. F) Treating RN: Cornell Barman Primary Care Provider: [name] Other Clinician: Jeanine Luz Referring Provider: [name] Treating Provider/Extender: Tito Dine in Treatment: 0 Debridement Performed for Wound #1 Left Calcaneus Assessment: Performed By: Physician Ricard Dillon, MD Debridement Type: Debridement Level of Consciousness (Pre- Awake and Alert procedure): Pre-procedure Verification/Time Out Yes - 14:09 Taken: Total Area Debrided (L x W): 2.5 (cm) x 2.7 (cm) = 6.75 (cm) Tissue and other material Non-Viable, Eschar debrided: Level: Non-Viable Tissue Debridement Description: Selective/Open Wound Instrument: Curette Bleeding: Minimum Hemostasis Achieved: Pressure Response to Treatment: Procedure was tolerated well Level of Consciousness (Post- Awake and Alert procedure): Post Debridement Measurements of Total  Wound Length: (cm) 2.5 Stage: Unstageable/Unclassified Width: (cm) 2.7 Depth: (cm) 0.1 Volume: (cm) 0.53 Character of Wound/Ulcer Post Debridement: Stable Post Procedure Diagnosis Same as Pre-procedure Electronic Signature(s) Signed: 08/03/2020 5:07:57 PM By: Linton Ham MD Signed: 08/03/2020 5:56:34 PM By: Gretta Cool, BSN, RN, CWS, Kim RN, BSN Entered By: Gretta Cool, BSN, RN, CWS, Kim on 08/03/2020 14:10:47 Taylor Castaneda ([id number]) -------------------------------------------------------------------------------- HPI Details Patient Name: Taylor Castaneda Date of Service: 08/03/2020 1:00 PM Medical Record Number: [id number] Patient Account Number: [id number] Date of Birth/Sex: 01/25/1933 (88 y.o. F) Treating RN: Cornell Barman Primary Care Provider: [name] Other Clinician: Jeanine Luz Referring Provider: [name] Treating Provider/Extender: Tito Dine in Treatment: 0 History of Present Illness HPI Description: ADMISSION 08/03/2020 This is a 87 year old woman who was in the hospital last month suffering a right CVA with mild residual left-sided symptoms. Sometime during this hospitalization or shortly after she got home they noted that she had an ulcer on her left heel. Saw her primary doctor on 2/25 who correctly pointed out this was unstageable and referred her here. Patient does not complain of any pain. She has home health [[facility]] but we are not exactly sure what they are currently using for wound dressing. Past medical history includes hypertension, type 2 diabetes, stage IIIb chronic renal failure, recent right CVA is noted, laparoscopic cholecystectomy also last month, gout, seizures ABI in our clinic was 0.71 on the left Electronic Signature(s) Signed: 08/03/2020 5:07:57 PM By: Linton Ham MD Entered By: Linton Ham on 08/03/2020 14:25:55 Taylor Castaneda  ([id number]) -------------------------------------------------------------------------------- Physical Exam Details Patient Name: Taylor Castaneda Date of Service: 08/03/2020 1:00 PM Medical Record Number: [id number] Patient Account Number: [id number] Date of Birth/Sex: Feb 13, 1933 (88 y.o. F) Treating RN: Cornell Barman Primary Care Provider: [name] Other Clinician: Jeanine Luz Referring Provider: [name] Treating Provider/Extender: Tito Dine in Treatment: 0 Constitutional Patient is hypertensive.. Pulse regular and within target range for patient.Marland Kitchen Respirations regular, non-labored and within target range.. Temperature is normal and within the target range for  the patient.Marland Kitchen appears in no distress. Cardiovascular Very faint posterior tibial and dorsalis pedis pulses. Notes Wound exam; the area in question is on the Achilles insertion site of the left heel. Completely nonviable surface. No evidence of surrounding infection no erythema. I used a #5 curette to remove lightly remove as much and coadherent black eschar as I could do easily. There is no evidence of infection in the soft tissue surrounding the wound. Her foot is generally warm Electronic Signature(s) Signed: 08/03/2020 5:07:57 PM By: Linton Ham MD Entered By: Linton Ham on 08/03/2020 14:28:48 Taylor Castaneda ([id number]) -------------------------------------------------------------------------------- Physician Orders Details Patient Name: Taylor Castaneda Date of Service: 08/03/2020 1:00 PM Medical Record Number: [id number] Patient Account Number: [id number] Date of Birth/Sex: 02/18/1933 (88 y.o. F) Treating RN: Cornell Barman Primary Care Provider: [name] Other Clinician: Jeanine Luz Referring Provider: [name] Treating Provider/Extender: Tito Dine in Treatment: 0 Verbal / Phone Orders: No Diagnosis Coding Follow-up Appointments Wound #1 Left  Calcaneus o Return Appointment in 1 week. Home Health Wound #1 Left [place]: - [facility]-established patient o [place] for wound care. May utilize formulary equivalent dressing for wound treatment orders unless otherwise specified. Home Health Nurse may visit PRN to address patientos wound care needs. o Scheduled days for dressing changes to be completed; exception, patient has scheduled wound care visit that day. o **Please direct any NON-WOUND related issues/requests for orders to patient's Primary Care Physician. **If current dressing causes regression in wound condition, may D/C ordered dressing product/s and apply Normal Saline Moist Dressing daily until next [place] or Other MD appointment. **Notify [facility] of regression in wound condition at [phone]. Bathing/ Shower/ Hygiene o May shower; gently cleanse wound with antibacterial soap, rinse and pat dry prior to dressing wounds Off-Loading o Other: - Float heels to keep pressure off of them. Additional Orders / Instructions o Follow Nutritious Diet and Increase Protein Intake Medications-Please add to medication list. o Santyl Enzymatic Ointment - If Santyl is not affordable, use Medihoney or Manuka Honey over the counter. Wound Treatment Wound #1 - Calcaneus Wound Laterality: Left Topical: Santyl Collagenase Ointment, 30 (gm), tube (Home Health) 1 x Per Day/30 Days Discharge Instructions: apply nickel thick to wound bed only Primary Dressing: Gauze ([place]) 1 x Per Day/30 Days Discharge Instructions: As directed: dry, moistened with saline or moistened with Dakins Solution Secondary Dressing: Kerlix 4.5 x 4.1 (in/yd) (Home Health) 1 x Per Day/30 Days Discharge Instructions: Wrapped lightly due to blood flow. Secured With: 61M Medipore H Soft Cloth Surgical Tape, 2x2 (in/yd) (Home Health) 1 x Per Day/30 Days Services and Therapies o Ankle  Brachial Index (ABI) - Left foot pressure wound o Arterial Studies- Unilateral - Left foot pressure wound Patient Medications Allergies: No Known Allergies Notifications Medication Indication Start End Santyl 08/04/2020 DOSE topical 250 unit/gram ointment - ointment topical to wound daily TRYPHENA, PERKOVICH ([id number]) Electronic Signature(s) Signed: 08/03/2020 2:31:30 PM By: Linton Ham MD Entered By: Linton Ham on 08/03/2020 14:31:30 Taylor Castaneda ([id number]) -------------------------------------------------------------------------------- Problem List Details Patient Name: Taylor Castaneda Date of Service: 08/03/2020 1:00 PM Medical Record Number: [id number] Patient Account Number: [id number] Date of Birth/Sex: 09-Aug-1932 (88 y.o. F) Treating RN: Cornell Barman Primary Care Provider: [name] Other Clinician: Jeanine Luz Referring Provider: [name] Treating Provider/Extender: Tito Dine in Treatment: 0 Active Problems ICD-10 Encounter Code Description Active Date MDM Diagnosis E11.621 Type 2 diabetes mellitus with foot  ulcer 08/03/2020 No Yes L89.620 Pressure ulcer of left heel, unstageable 08/03/2020 No Yes E11.51 Type 2 diabetes mellitus with diabetic peripheral angiopathy without 08/03/2020 No Yes gangrene Inactive Problems Resolved Problems Electronic Signature(s) Signed: 08/03/2020 5:07:57 PM By: Linton Ham MD Entered By: Linton Ham on 08/03/2020 14:17:39 Taylor Castaneda, Taylor Castaneda ([id number]) -------------------------------------------------------------------------------- Progress Note Details Patient Name: Taylor Castaneda Date of Service: 08/03/2020 1:00 PM Medical Record Number: [id number] Patient Account Number: [id number] Date of Birth/Sex: [birth year] (88 y.o. F) Treating RN: Cornell Barman Primary Care Provider: [name] Other Clinician: Jeanine Luz Referring Provider: [name] Treating Provider/Extender: Tito Dine in Treatment: 0 Subjective Chief Complaint Information obtained from Patient 08/03/2020; patient is here for review of a wound on her left Achilles heel History of Present Illness (HPI) ADMISSION 08/03/2020 This is a 87 year old woman who was in the hospital last month suffering a right CVA with mild residual left-sided symptoms. Sometime during this hospitalization or shortly after she got home they noted that she had an ulcer on her left heel. Saw her primary doctor on 2/25 who correctly pointed out this was unstageable and referred her here. Patient does not complain of any pain. She has home health [[facility]] but we are not exactly sure what they are currently using for wound dressing. Past medical history includes hypertension, type 2 diabetes, stage IIIb chronic renal failure, recent right CVA is noted, laparoscopic cholecystectomy also last month, gout, seizures ABI in our clinic was 0.71 on the left Patient History Information obtained from Patient. Allergies No Known Allergies Social History Never smoker, Alcohol Use - Never, Drug Use - No History, Caffeine Use - Rarely. Medical History Eyes Patient has history of Cataracts Denies history of Glaucoma, Optic Neuritis Ear/Nose/Mouth/Throat Denies history of Chronic sinus problems/congestion, Middle ear problems Hematologic/Lymphatic Denies history of Anemia, Hemophilia, Human Immunodeficiency Virus, Lymphedema, Sickle Cell Disease Respiratory Denies history of Aspiration, Asthma, Chronic Obstructive Pulmonary Disease (COPD), Pneumothorax, Sleep Apnea, Tuberculosis Cardiovascular Patient has history of Hypertension, Myocardial Infarction, Peripheral Arterial Disease Denies history of Angina, Arrhythmia, Congestive Heart Failure, Coronary Artery Disease, Deep Vein Thrombosis, Hypotension, Peripheral Venous Disease, Phlebitis, Vasculitis Gastrointestinal Denies history of Cirrhosis , Colitis, Crohn s, Hepatitis A,  Hepatitis B, Hepatitis C Endocrine Patient has history of Type I Diabetes Genitourinary Patient has history of End Stage Renal Disease - 10 years Immunological Denies history of Lupus Erythematosus, Raynaud s, Scleroderma Integumentary (Skin) Denies history of History of Burn, History of pressure wounds Musculoskeletal Patient has history of Gout Denies history of Rheumatoid Arthritis, Osteoarthritis, Osteomyelitis Neurologic Patient has history of Seizure Disorder - 1 year Denies history of Dementia, Neuropathy, Quadriplegia, Paraplegia Oncologic Denies history of Received Chemotherapy, Received Radiation Psychiatric Denies history of Anorexia/bulimia, Confinement Anxiety Patient is treated with Insulin. Blood sugar is tested. Blood sugar results noted at the following times: Breakfast - 117. Taylor Castaneda, Taylor Castaneda ([id number]) Review of Systems (ROS) Constitutional Symptoms (General Health) Denies complaints or symptoms of Fatigue, Fever, Chills, Marked Weight Change. Eyes Denies complaints or symptoms of Dry Eyes, Vision Changes, Glasses / Contacts. Ear/Nose/Mouth/Throat Denies complaints or symptoms of Difficult clearing ears, Sinusitis. Hematologic/Lymphatic Denies complaints or symptoms of Bleeding / Clotting Disorders, Human Immunodeficiency Virus. Respiratory Denies complaints or symptoms of Chronic or frequent coughs, Shortness of Breath. Cardiovascular Denies complaints or symptoms of Chest pain, LE edema. Gastrointestinal Denies complaints or symptoms of Frequent diarrhea, Nausea, Vomiting. Endocrine Denies complaints or symptoms of Hepatitis, Thyroid disease, Polydypsia (Excessive Thirst). Genitourinary Complains or has symptoms of Kidney  failure/ Dialysis. Denies complaints or symptoms of Incontinence/dribbling. Immunological Denies complaints or symptoms of Hives, Itching. Integumentary (Skin) Complains or has symptoms of Wounds. Denies complaints or symptoms of  Bleeding or bruising tendency, Breakdown, Swelling. Musculoskeletal Denies complaints or symptoms of Muscle Pain, Muscle Weakness. Neurologic Denies complaints or symptoms of Numbness/parasthesias, Focal/Weakness. Oncologic colon cancer Psychiatric Denies complaints or symptoms of Anxiety, Claustrophobia. Objective Constitutional Patient is hypertensive.. Pulse regular and within target range for patient.Marland Kitchen Respirations regular, non-labored and within target range.. Temperature is normal and within the target range for the patient.Marland Kitchen appears in no distress. Vitals Time Taken: 1:15 PM, Height: 60 in, Weight: 105 lbs, Source: Stated, BMI: 20.5, Temperature: 97.8 F, Pulse: 71 bpm, Respiratory Rate: 18 breaths/min, Blood Pressure: 168/80 mmHg. Cardiovascular Very faint posterior tibial and dorsalis pedis pulses. General Notes: Wound exam; the area in question is on the Achilles insertion site of the left heel. Completely nonviable surface. No evidence of surrounding infection no erythema. I used a #5 curette to remove lightly remove as much and coadherent black eschar as I could do easily. There is no evidence of infection in the soft tissue surrounding the wound. Her foot is generally warm Integumentary (Hair, Skin) Wound #1 status is Open. Original cause of wound was Gradually Appeared. The date acquired was: 06/22/2020. The wound is located on the Left Calcaneus. The wound measures 2.5cm length x 2.7cm width x 0.1cm depth; 5.301cm^2 area and 0.53cm^3 volume. There is no tunneling or undermining noted. There is a none present amount of drainage noted. There is no granulation within the wound bed. There is a large (67-100%) amount of necrotic tissue within the wound bed including Eschar. Assessment Active Problems ICD-10 Type 2 diabetes mellitus with foot ulcer Pressure ulcer of left heel, unstageable Taylor Castaneda, Taylor B. ([id number]) Type 2 diabetes mellitus with diabetic peripheral angiopathy  without gangrene Procedures Wound #1 Pre-procedure diagnosis of Wound #1 is a Pressure Ulcer located on the Left Calcaneus . There was a Selective/Open Wound Non-Viable Tissue Debridement with a total area of 6.75 sq cm performed by Ricard Dillon, MD. With the following instrument(s): Curette to remove Non- Viable tissue/material. Material removed includes Eschar. No specimens were taken. A time out was conducted at 14:09, prior to the start of the procedure. A Minimum amount of bleeding was controlled with Pressure. The procedure was tolerated well. Post Debridement Measurements: 2.5cm length x 2.7cm width x 0.1cm depth; 0.53cm^3 volume. Post debridement Stage noted as Unstageable/Unclassified. Character of Wound/Ulcer Post Debridement is stable. Post procedure Diagnosis Wound #1: Same as Pre-Procedure Plan Follow-up Appointments: Wound #1 Left Calcaneus: Return Appointment in 1 week. Home Health: Wound #1 Left Calcaneus: [place]: - [facility]-established patient [place] for wound care. May utilize formulary equivalent dressing for wound treatment orders unless otherwise specified. Home Health Nurse may visit PRN to address patient s wound care needs. Scheduled days for dressing changes to be completed; exception, patient has scheduled wound care visit that day. **Please direct any NON-WOUND related issues/requests for orders to patient's Primary Care Physician. **If current dressing causes regression in wound condition, may D/C ordered dressing product/s and apply Normal Saline Moist Dressing daily until next [place] or Other MD appointment. **Notify [facility] of regression in wound condition at [phone]. Bathing/ Shower/ Hygiene: May shower; gently cleanse wound with antibacterial soap, rinse and pat dry prior to dressing wounds Off-Loading: Other: - Float heels to keep pressure off of them. Additional Orders / Instructions: Follow  Nutritious Diet and Increase Protein Intake Medications-Please add to medication list.: Santyl Enzymatic Ointment - If Santyl is not affordable, use Medihoney or Manuka Honey over the counter. Services and Therapies ordered were: Ankle Brachial Index (ABI) - Left foot pressure wound, Arterial Studies- Unilateral - Left foot pressure wound WOUND #1: - Calcaneus Wound Laterality: Left Topical: Santyl Collagenase Ointment, 30 (gm), tube (Home Health) 1 x Per Day/30 Days Discharge Instructions: apply nickel thick to wound bed only Primary Dressing: Gauze ([place]) 1 x Per Day/30 Days Discharge Instructions: As directed: dry, moistened with saline or moistened with Dakins Solution Secondary Dressing: Kerlix 4.5 x 4.1 (in/yd) (Home Health) 1 x Per Day/30 Days Discharge Instructions: Wrapped lightly due to blood flow. Secured With: 57M Medipore H Soft Cloth Surgical Tape, 2x2 (in/yd) (Home Health) 1 x Per Day/30 Days 1. Diabetic foot ulcer likely a significant pressure component given the history 2. We will use Santyl as the primary dressing, Medihoney if this is unaffordable 3. The patient is cautioned to keep pressure off this area I discussed this with the patient and her son 4. She is wearing an open heeled sandal which is satisfactory 5. I am going to order arterial studies on her. I suspect she has PAD but I am not convinced that this will be a clue wound completely.treatable 6. I did not order an x-ray here but that may change if the wound deteriorates Electronic Signature(s) Signed: 08/03/2020 5:07:57 PM By: Linton Ham MD Entered By: Linton Ham on 08/03/2020 14:29:55 Taylor Castaneda ([id number]) -------------------------------------------------------------------------------- ROS/PFSH Details Patient Name: Taylor Castaneda Date of Service: 08/03/2020 1:00 PM Medical Record Number: [id number] Patient Account Number: [id number] Date of Birth/Sex: 1932/09/06 (88 y.o. F) Treating RN:  Cornell Barman Primary Care Provider: [name] Other Clinician: Jeanine Luz Referring Provider: [name] Treating Provider/Extender: Tito Dine in Treatment: 0 Information Obtained From Patient Constitutional Symptoms (General Health) Complaints and Symptoms: Negative for: Fatigue; Fever; Chills; Marked Weight Change Eyes Complaints and Symptoms: Negative for: Dry Eyes; Vision Changes; Glasses / Contacts Medical History: Positive for: Cataracts Negative for: Glaucoma; Optic Neuritis Ear/Nose/Mouth/Throat Complaints and Symptoms: Negative for: Difficult clearing ears; Sinusitis Medical History: Negative for: Chronic sinus problems/congestion; Middle ear problems Hematologic/Lymphatic Complaints and Symptoms: Negative for: Bleeding / Clotting Disorders; Human Immunodeficiency Virus Medical History: Negative for: Anemia; Hemophilia; Human Immunodeficiency Virus; Lymphedema; Sickle Cell Disease Respiratory Complaints and Symptoms: Negative for: Chronic or frequent coughs; Shortness of Breath Medical History: Negative for: Aspiration; Asthma; Chronic Obstructive Pulmonary Disease (COPD); Pneumothorax; Sleep Apnea; Tuberculosis Cardiovascular Complaints and Symptoms: Negative for: Chest pain; LE edema Medical History: Positive for: Hypertension; Myocardial Infarction; Peripheral Arterial Disease Negative for: Angina; Arrhythmia; Congestive Heart Failure; Coronary Artery Disease; Deep Vein Thrombosis; Hypotension; Peripheral Venous Disease; Phlebitis; Vasculitis Gastrointestinal Complaints and Symptoms: Negative for: Frequent diarrhea; Nausea; Vomiting Medical History: Negative for: Cirrhosis ; Colitis; Crohnos; Hepatitis A; Hepatitis B; Hepatitis C Endocrine Taylor Castaneda, SHIBUYA. ([id number]) Complaints and Symptoms: Negative for: Hepatitis; Thyroid disease; Polydypsia (Excessive Thirst) Medical History: Positive for: Type I Diabetes Time with  diabetes: 10 years Treated with: Insulin Blood sugar tested every day: Yes Tested : 4 times a day Blood sugar testing results: Breakfast: 117 Genitourinary Complaints and Symptoms: Positive for: Kidney failure/ Dialysis Negative for: Incontinence/dribbling Medical History: Positive for: End Stage Renal Disease - 10 years Immunological Complaints and Symptoms: Negative for: Hives; Itching Medical History: Negative for: Lupus Erythematosus; Raynaudos; Scleroderma Integumentary (Skin) Complaints and Symptoms: Positive for: Wounds Negative for: Bleeding  or bruising tendency; Breakdown; Swelling Medical History: Negative for: History of Burn; History of pressure wounds Musculoskeletal Complaints and Symptoms: Negative for: Muscle Pain; Muscle Weakness Medical History: Positive for: Gout Negative for: Rheumatoid Arthritis; Osteoarthritis; Osteomyelitis Neurologic Complaints and Symptoms: Negative for: Numbness/parasthesias; Focal/Weakness Medical History: Positive for: Seizure Disorder - 1 year Negative for: Dementia; Neuropathy; Quadriplegia; Paraplegia Psychiatric Complaints and Symptoms: Negative for: Anxiety; Claustrophobia Medical History: Negative for: Anorexia/bulimia; Confinement Anxiety Oncologic Complaints and Symptoms: Review of System Notes: colon cancer Medical History: Negative for: Received Chemotherapy; Received Radiation Taylor Castaneda, Taylor Castaneda ([id number]) HBO Extended History Items Eyes: Cataracts Immunizations Pneumococcal Vaccine: Received Pneumococcal Vaccination: Yes Implantable Devices None Family and Social History Never smoker; Alcohol Use: Never; Drug Use: No History; Caffeine Use: Rarely Electronic Signature(s) Signed: 08/03/2020 5:07:57 PM By: Linton Ham MD Signed: 08/03/2020 5:56:34 PM By: Gretta Cool BSN, RN, CWS, Kim RN, BSN Signed: 08/04/2020 1:24:27 PM By: Jeanine Luz Entered By: Jeanine Luz on 08/03/2020 13:27:09 Taylor Castaneda  ([id number]) -------------------------------------------------------------------------------- SuperBill Details Patient Name: Taylor Castaneda, Taylor Castaneda. Date of Service: 08/03/2020 Medical Record Number: [id number] Patient Account Number: [id number] Date of Birth/Sex: Oct 27, 1932 (88 y.o. F) Treating RN: Cornell Barman Primary Care Provider: [name] Other Clinician: Jeanine Luz Referring Provider: [name] Treating Provider/Extender: Tito Dine in Treatment: 0 Diagnosis Coding ICD-10 Codes Code Description E11.621 Type 2 diabetes mellitus with foot ulcer L89.620 Pressure ulcer of left heel, unstageable E11.51 Type 2 diabetes mellitus with diabetic peripheral angiopathy without gangrene Facility Procedures CPT4 Code: 11021117 Description: 35670 - WOUND CARE VISIT-LEV 3 EST PT Modifier: Quantity: 1 CPT4 Code: 14103013 Description: 14388 - DEBRIDE WOUND 1ST 20 SQ CM OR < Modifier: Quantity: 1 CPT4 Code: Description: ICD-10 Diagnosis Description L89.620 Pressure ulcer of left heel, unstageable Modifier: Quantity: Physician Procedures CPT4 Code: [id number] Description: WC PHYS LEVEL 3 o NEW PT Modifier: 25 Quantity: 1 CPT4 Code: Description: ICD-10 Diagnosis Description E11.621 Type 2 diabetes mellitus with foot ulcer L89.620 Pressure ulcer of left heel, unstageable E11.51 Type 2 diabetes mellitus with diabetic peripheral angiopathy without ga Modifier: ngrene Quantity: CPT4 Code: 8206015 Description: 61537 - WC PHYS DEBR WO ANESTH 20 SQ CM Modifier: Quantity: 1 CPT4 Code: Description: ICD-10 Diagnosis Description L89.620 Pressure ulcer of left heel, unstageable Modifier: Quantity: Electronic Signature(s) Signed: 08/03/2020 5:07:57 PM By: Linton Ham MD Entered By: Linton Ham on 08/03/2020 14:32:05

## 2020-08-04 NOTE — Progress Notes (Signed)
FANY, CAVANAUGH ([id number]) Visit Report for 08/03/2020 Abuse/Suicide Risk Screen Details Patient Name: Taylor Castaneda, Taylor Castaneda. Date of Service: 08/03/2020 1:00 PM Medical Record Number: [id number] Patient Account Number: [id number] Date of Birth/Sex: July 27, 1932 (87 y.o. F) Treating RN: Cornell Barman Primary Care Dmitri Pettigrew: [name] Other Clinician: Jeanine Luz Referring Mikayela Deats: [name] Treating Elijha Dedman/Extender: Tito Dine in Treatment: 0 Abuse/Suicide Risk Screen Items Answer ABUSE RISK SCREEN: Has anyone close to you tried to hurt or harm you recentlyo No Do you feel uncomfortable with anyone in your familyo No Has anyone forced you do things that you didnot want to doo No Electronic Signature(s) Signed: 08/03/2020 5:56:34 PM By: Gretta Cool, BSN, RN, CWS, Kim RN, BSN Signed: 08/04/2020 1:24:27 PM By: Jeanine Luz Entered By: Jeanine Luz on 08/03/2020 13:27:18 Taylor Castaneda ([id number]) -------------------------------------------------------------------------------- Activities of Daily Living Details Patient Name: Taylor Castaneda, Taylor Castaneda. Date of Service: 08/03/2020 1:00 PM Medical Record Number: [id number] Patient Account Number: [id number] Date of Birth/Sex: Oct 24, 1932 (87 y.o. F) Treating RN: Cornell Barman Primary Care Leanne Sisler: [name] Other Clinician: Jeanine Luz Referring Kendre Sires: [name] Treating Pualani Borah/Extender: Tito Dine in Treatment: 0 Activities of Daily Living Items Answer Activities of Daily Living (Please select one for each item) Drive Automobile Not Able Take Medications Need Assistance Use Telephone Completely Able Care for Appearance Completely Able Use Toilet Completely Able Bath / Shower Completely Able Dress Self Completely Able Feed Self Completely Able Walk Need Assistance Get In / Out Bed Completely [place] Need Assistance Shop for  Self Need Assistance Electronic Signature(s) Signed: 08/03/2020 5:56:34 PM By: Gretta Cool, BSN, RN, CWS, Kim RN, BSN Signed: 08/04/2020 1:24:27 PM By: Jeanine Luz Entered By: Jeanine Luz on 08/03/2020 13:28:34 Taylor Castaneda ([id number]) -------------------------------------------------------------------------------- Education Screening Details Patient Name: Taylor Castaneda Date of Service: 08/03/2020 1:00 PM Medical Record Number: [id number] Patient Account Number: [id number] Date of Birth/Sex: 1932-08-24 (87 y.o. F) Treating RN: Cornell Barman Primary Care Shamon Cothran: [name] Other Clinician: Jeanine Luz Referring Laryah Neuser: [name] Treating Enrico Eaddy/Extender: Tito Dine in Treatment: 0 Primary Learner Assessed: Patient Learning Preferences/Education Level/Primary Language Learning Preference: Explanation, Demonstration Highest Education Level: High School Preferred Language: English Cognitive Barrier Language Barrier: No Translator Needed: No Memory Deficit: No Emotional Barrier: No Cultural/Religious Beliefs Affecting Medical Care: No Physical Barrier Impaired Vision: No Impaired Hearing: No Decreased Hand dexterity: No Knowledge/Comprehension Knowledge Level: Medium Comprehension Level: Medium Ability to understand written instructions: Medium Ability to understand verbal instructions: Medium Motivation Anxiety Level: Calm Cooperation: Cooperative Education Importance: Acknowledges Need Perception: Coherent Willingness to Engage in Self-Management Medium Activities: Readiness to Engage in Self-Management Medium Activities: Electronic Signature(s) Signed: 08/03/2020 5:56:34 PM By: Gretta Cool, BSN, RN, CWS, Kim RN, BSN Signed: 08/04/2020 1:24:27 PM By: Jeanine Luz Entered By: Jeanine Luz on 08/03/2020 13:31:22 Taylor Castaneda ([id number]) -------------------------------------------------------------------------------- Fall Risk  Assessment Details Patient Name: Taylor Castaneda Date of Service: 08/03/2020 1:00 PM Medical Record Number: [id number] Patient Account Number: [id number] Date of Birth/Sex: 03/19/1933 (87 y.o. F) Treating RN: Cornell Barman Primary Care Chanceler Pullin: [name] Other Clinician: Jeanine Luz Referring Lewis Keats: [name] Treating Jeneen Doutt/Extender: Tito Dine in Treatment: 0 Fall Risk Assessment Items Have you had 2 or more falls in the last 12 monthso 0 No Have you had any fall that resulted in injury in the last 12 monthso 0 No FALLS RISK SCREEN History of falling - immediate or within 3 months 25 Yes Secondary diagnosis (  Do you have 2 or more medical diagnoseso) 0 No Ambulatory aid None/bed rest/wheelchair/nurse 0 No Crutches/cane/walker 0 No Furniture 0 No Intravenous therapy Access/Saline/Heparin Lock 0 No Gait/Transferring Normal/ bed rest/ wheelchair 0 No Weak (short steps with or without shuffle, stooped but able to lift head while walking, may 0 No seek support from furniture) Impaired (short steps with shuffle, may have difficulty arising from chair, head down, impaired 0 No balance) Mental Status Oriented to own ability 0 No Electronic Signature(s) Signed: 08/03/2020 5:56:34 PM By: Gretta Cool, BSN, RN, CWS, Kim RN, BSN Signed: 08/04/2020 1:24:27 PM By: Jeanine Luz Entered By: Jeanine Luz on 08/03/2020 13:31:36 Taylor Castaneda ([id number]) -------------------------------------------------------------------------------- Foot Assessment Details Patient Name: Taylor Castaneda Date of Service: 08/03/2020 1:00 PM Medical Record Number: [id number] Patient Account Number: [id number] Date of Birth/Sex: [birth year] (87 y.o. F) Treating RN: Cornell Barman Primary Care Clarisse Rodriges: [name] Other Clinician: Jeanine Luz Referring Linsie Lupo: [name] Treating Loda Bialas/Extender: Tito Dine in Treatment: 0 Foot Assessment Items [x]   Unable to perform due to altered mental status Site Locations + = Sensation present, - = Sensation absent, C = Callus, U = Ulcer R = Redness, W = Warmth, M = Maceration, PU = Pre-ulcerative lesion F = Fissure, S = Swelling, D = Dryness Assessment Right: Left: Other Deformity: No No Prior Foot Ulcer: No No Prior Amputation: No No Charcot Joint: No No Ambulatory Status: Ambulatory With Help Assistance Device: Wheelchair Gait: Administrator, arts) Signed: 08/03/2020 5:56:34 PM By: Gretta Cool, BSN, RN, CWS, Kim RN, BSN Signed: 08/04/2020 1:24:27 PM By: Jeanine Luz Entered By: Jeanine Luz on 08/03/2020 13:36:26 Taylor Castaneda ([id number]) -------------------------------------------------------------------------------- Nutrition Risk Screening Details Patient Name: Taylor Castaneda Date of Service: 08/03/2020 1:00 PM Medical Record Number: [id number] Patient Account Number: [id number] Date of Birth/Sex: May 29, 1933 (87 y.o. F) Treating RN: Cornell Barman Primary Care Magdalynn Davilla: [name] Other Clinician: Jeanine Luz Referring Anyae Griffith: [name] Treating Zeva Leber/Extender: Tito Dine in Treatment: 0 Height (in): 60 Weight (lbs): 105 Body Mass Index (BMI): 20.5 Nutrition Risk Screening Items Score Screening NUTRITION RISK SCREEN: I have an illness or condition that made me change the kind and/or amount of food I eat 0 No I eat fewer than two meals per day 0 No I eat few fruits and vegetables, or milk products 0 No I have three or more drinks of beer, liquor or wine almost every day 0 No I have tooth or mouth problems that make it hard for me to eat 0 No I don't always have enough money to buy the food I need 0 No I eat alone most of the time 0 No I take three or more different prescribed or over-the-counter drugs a day 1 Yes Without wanting to, I have lost or gained 10 pounds in the last six months 2 Yes I am not always physically able to  shop, cook and/or feed myself 0 No Nutrition Protocols Good Risk Protocol Moderate Risk Protocol High Risk Proctocol Risk Level: Moderate Risk Score: 3 Electronic Signature(s) Signed: 08/03/2020 5:56:34 PM By: Gretta Cool, BSN, RN, CWS, Kim RN, BSN Signed: 08/04/2020 1:24:27 PM By: Jeanine Luz Entered By: Jeanine Luz on 08/03/2020 13:31:46

## 2020-08-08 ENCOUNTER — Other Ambulatory Visit: Payer: Self-pay

## 2020-08-08 ENCOUNTER — Ambulatory Visit (INDEPENDENT_AMBULATORY_CARE_PROVIDER_SITE_OTHER): Payer: Medicare Other

## 2020-08-08 DIAGNOSIS — E785 Hyperlipidemia, unspecified: Secondary | ICD-10-CM | POA: Diagnosis not present

## 2020-08-08 DIAGNOSIS — E1169 Type 2 diabetes mellitus with other specified complication: Secondary | ICD-10-CM | POA: Diagnosis not present

## 2020-08-08 DIAGNOSIS — I10 Essential (primary) hypertension: Secondary | ICD-10-CM

## 2020-08-08 NOTE — Progress Notes (Signed)
Chronic Care Management Pharmacy Note  08/16/2020 Name:  Taylor Castaneda MRN:  [id number] DOB:  05/18/1933  Subjective: Taylor Castaneda is an 87 y.o. year old female who is a primary patient of [name], MD.  The CCM team was consulted for assistance with disease management and care coordination needs.  Spoke with [name], patient's daughter.   Engaged with patient by telephone for follow up visit in response to provider referral for pharmacy case management and/or care coordination services. Spoke with [name], patient's daughter.  Consent to Services:  The patient was given information about Chronic Care Management services, agreed to services, and gave verbal consent prior to initiation of services.  Please see initial visit note for detailed documentation.   Patient Care Team: [name], MD as PCP - General (Family Medicine) Jacelyn Pi, MD as Consulting Physician (Endocrinology) [name], [facility] as Pharmacist (Pharmacist)  Recent office visits:  07/29/20 - PCP - See prior note for details. BP better controlled since starting losartan - check Cr today. Brings BP log showing BP 108-160s/70-80s, HR 60-70s. Tolerating medicine. Pressure sores - on sacrum and L heel. Sacral wound largely improved. [facility] requesting orders for heel wound. For breast rash likely intertrigo - treat with nystatin cream sent to pharmacy  07/15/20 - Pt call - D/c potassium  07/12/20 - PCP - Update gout levels. H/o infrequent flares, but possibly becoming more frequent recently. possible gout flare - check labs today and I did recommend they try colchicine they have at home (holding statin for 1 day when taken). BP Elevated today in setting of recent stroke. Update labs, consider starting losartan for uricosuric effect. DM controlled. Follow with endo.  Recent consult visits:  07/25/20 - Neurology - UPDATE (07/25/20, VRP): Since last visit, admitted to hospital on 05/30/20 for possible seizure;  also treated for gallstones and then found to have small stroke on MRI. Then transferred to inpatient rehab on 06/10/20. Since then has been stable. STROKE ASA 35m and and Plavix 75 mg daily for3 months and then plavix alone (starting September 02, 2020) given severe intracranial stenosis, Atorvastatin 473mdaily, DM, BP control. SEIZURE (? due to mild dementia + stroke) continue levetiracetam 75052mwice a day. MEMORY LOSS (mild dementia) safety / supervision issues reviewed, caregiver resources provided, no driving; caution with finances, medications and living alone.  07/17/20 - Endocrinology - note unavailable  Hospital visits:   05/30/20- 06/10/20 - Stroke  Objective:  Lab Results  Component Value Date   CREATININE 1.17 07/29/2020   BUN 24 (H) 07/29/2020   GFR 42.38 (L) 07/29/2020   GFRNONAA 31 (L) 06/24/2020   GFRAA 46 (L) 03/06/2019   NA 139 07/29/2020   K 5.0 07/29/2020   CALCIUM 9.7 07/29/2020   CO2 27 07/29/2020    Lab Results  Component Value Date/Time   HGBA1C 7.9 (H) 06/02/2020 09:16 PM   HGBA1C 8.7 (H) 02/12/2020 07:59 AM   HGBA1C 7 04/06/2019 12:00 AM   GFR 42.38 (L) 07/29/2020 10:39 AM   GFR 38.06 (L) 07/12/2020 05:11 PM   MICROALBUR 2.2 (H) 08/20/2017 10:22 AM   MICROALBUR 7.3 (H) 03/10/2014 09:12 AM    Last diabetic Eye exam:  Lab Results  Component Value Date/Time   HMDIABEYEEXA No Retinopathy 05/05/2020 12:00 AM    Last diabetic Foot exam: Followed by endocrinology  Lab Results  Component Value Date   CHOL 118 02/12/2020   HDL 32.30 (L) 02/12/2020   LDLCALC 60 02/12/2020   LDLDIRECT 103.0  02/18/2018   TRIG 127.0 02/12/2020   CHOLHDL 4 02/12/2020    Hepatic Function Latest Ref Rng & Units 07/29/2020 06/13/2020 06/07/2020  Total Protein 6.5 - 8.1 g/dL - 5.3(L) 5.0(L)  Albumin 3.5 - 5.2 g/dL 3.5 2.3(L) 2.3(L)  AST 15 - 41 U/L - 21 29  ALT 0 - 44 U/L - 22 36  Alk Phosphatase 38 - 126 U/L - 156(H) 171(H)  Total Bilirubin 0.3 - 1.2 mg/dL - 0.7 0.5   Bilirubin, Direct 0.0 - 0.2 mg/dL - - -    Lab Results  Component Value Date/Time   TSH 1.712 06/02/2020 02:30 PM   TSH 2.12 01/28/2019 11:35 AM   TSH 1.20 10/08/2012 08:16 AM    CBC Latest Ref Rng & Units 07/29/2020 07/12/2020 06/19/2020  WBC 4.0 - 10.5 K/uL 7.8 11.3(H) 8.8  Hemoglobin 12.0 - 15.0 g/dL 11.3(L) 10.2(L) 8.4(L)  Hematocrit 36.0 - 46.0 % 34.6(L) 31.9(L) 25.3(L)  Platelets 150.0 - 400.0 K/uL 190.0 203.0 213    Lab Results  Component Value Date/Time   VD25OH 41.03 02/12/2020 07:59 AM   VD25OH 45.81 02/26/2019 08:55 AM    Clinical ASCVD: Yes  The ASCVD Risk score Mikey Bussing DC Jr., et al., 2013) failed to calculate for the following reasons:   The 2013 ASCVD risk score is only valid for ages 97 to 32   The patient has a prior MI or stroke diagnosis    Depression screen [facility] 2/9 07/05/2020 02/19/2020 02/19/2020  Decreased Interest _0 Down, Depressed, Hopeless 0 0 0  PHQ - 2 Score _1 Altered sleeping 2 2 -  Tired, decreased energy 2 2 -  Change in appetite 2 2 -  Feeling bad or failure about yourself  0 0 -  Trouble concentrating 1 0 -  Moving slowly or fidgety/restless 0 0 -  Suicidal thoughts 0 0 -  PHQ-9 Score 8 8 -  Difficult doing work/chores - - -  Some recent data might be hidden     Social History   Tobacco Use  Smoking Status Former Smoker  . Years: 2.00  . Types: Cigarettes  . Quit date: 06/04/1973  . Years since quitting: 47.2  Smokeless Tobacco Never Used  Tobacco Comment   Smoked for 5 years 1965-1970 up to 1/2 pp week   BP Readings from Last 3 Encounters:  08/15/20 (!) 177/58  08/12/20 124/68  07/29/20 132/68   Pulse Readings from Last 3 Encounters:  08/15/20 76  08/12/20 71  07/29/20 68   Wt Readings from Last 3 Encounters:  08/15/20 106 lb 12.8 oz (48.4 kg)  08/12/20 106 lb (48.1 kg)  07/29/20 105 lb 1 oz (47.7 kg)    Assessment/Interventions: Review of patient past medical history, allergies, medications, health status,  including review of consultants reports, laboratory and other test data, was performed as part of comprehensive evaluation and provision of chronic care management services.   SDOH:  (Social Determinants of Health) assessments and interventions performed: Yes SDOH Interventions   Flowsheet Row Most Recent Value  SDOH Interventions   Financial Strain Interventions Intervention Not Indicated  [Medications Affordable]      CCM Care Plan  Allergies  Allergen Reactions  . Gabapentin Other (See Comments)    Dizziness  . Pravastatin Sodium Other (See Comments)    "Anemia," per Dr Gilford Rile, [facility]    Medications Reviewed Today    Reviewed by Sandy Salaam, CMA (Certified Medical Assistant) on 08/15/20 at 1600  Med  List Status: <None>  Medication Order Taking? Sig Documenting Provider Last Dose Status Informant  acetaminophen (TYLENOL) 325 MG tablet [id number] Yes Take 325-650 mg by mouth every 6 (six) hours as needed (for pain). [provider] Taking Active   aspirin EC 325 MG EC tablet [id number] Yes Take 1 tablet (325 mg total) by mouth daily. Dessa Phi, DO Taking Active   atorvastatin (LIPITOR) 40 MG tablet [id number] Yes Take 1 tablet (40 mg total) by mouth daily at 6 PM. Angiulli, Lavon Paganini, PA-C Taking Active   Carboxymethylcellul-Glycerin 1-0.25 % SOLN [id number] Yes Place 1-2 drops into both eyes 3 (three) times daily as needed (for dry/irritated eyes.). [provider] Taking Active Family Member  carvedilol (COREG) 12.5 MG tablet [id number] Yes Take 1 tablet (12.5 mg total) by mouth 2 (two) times daily. Cathlyn Parsons, PA-C Taking Active   Cholecalciferol (VITAMIN D3) 25 MCG (1000 UT) CAPS [id number] Yes Take 1 capsule (1,000 Units total) by mouth daily. Cathlyn Parsons, PA-C Taking Active   Cinnamon 500 MG capsule [id number] Yes Take 1,000 mg by mouth daily. [provider] Taking Active Family Member  clopidogrel (PLAVIX) 75 MG tablet [id number] Yes  Take 1 tablet (75 mg total) by mouth daily. Penumalli, Earlean Polka, MD Taking Active   colchicine 0.6 MG tablet [id number] Yes Take 1 tablet (0.6 mg total) by mouth daily as needed (gout flare). First day of gout flare, may take 1 tablet twice daily. [name], MD Taking Active   collagenase Annitta Needs) ointment [id number] Yes Apply 1 application topically daily. [provider] Taking Active   CONTOUR NEXT TEST test strip [id number] Yes 1 each 4 (four) times daily. [provider] Taking Active   diclofenac Sodium (VOLTAREN) 1 % GEL [id number] Yes Apply 2 g topically 4 (four) times daily. Cathlyn Parsons, PA-C Taking Active   Ferrous Sulfate (IRON) 325 (65 Fe) MG TABS [id number] Yes Take 1 tablet (325 mg total) by mouth See admin instructions. Take 325 mg by mouth in the evening on Mondays and Thursdays only Angiulli, Lavon Paganini, PA-C Taking Active   insulin glargine (LANTUS) 100 UNIT/ML Solostar Pen [id number] Yes Inject 8 Units into the skin daily. Cathlyn Parsons, PA-C Taking Active   levETIRAcetam (KEPPRA) 750 MG tablet [id number] Yes Take 1 tablet (750 mg total) by mouth 2 (two) times daily. Penumalli, Earlean Polka, MD Taking Active   losartan (COZAAR) 25 MG tablet [id number] Yes Take 1 tablet (25 mg total) by mouth daily. [name], MD Taking Active   Multiple Vitamins-Minerals (PRESERVISION AREDS 2 PO) [id number] Yes Take 1 capsule by mouth 2 (two) times daily. [provider] Taking Active Family Member  nystatin cream (MYCOSTATIN) [id number] Yes Apply 1 application topically 2 (two) times daily. [name], MD Taking Active   pantoprazole (PROTONIX) 40 MG tablet [id number] Yes Take 1 tablet (40 mg total) by mouth 2 (two) times daily. [name], MD Taking Active           Patient Active Problem List   Diagnosis Date Noted  . Hereditary and idiopathic neuropathy, unspecified 08/15/2020  . Hypercalcemia 08/15/2020  . Hypercholesterolemia 08/15/2020   . Long term (current) use of insulin ([place]) 08/15/2020  . Proteinuria 08/15/2020  . Intertrigo 07/29/2020  . Acute pain of right knee 07/13/2020  . Melena   . Pressure injury of left heel, unstageable ([place]) 06/12/2020  . Cerebral thrombosis with cerebral infarction 06/03/2020  . Common bile duct (CBD) obstruction   . Choledocholithiasis 05/30/2020  .  Cholelithiasis 05/30/2020  . Hyperbilirubinemia 05/30/2020  . Normocytic anemia 05/30/2020  . Transaminitis 05/30/2020  . Pancreatic duct dilated 05/30/2020  . Dry age-related macular degeneration 05/06/2020  . Secondary hyperparathyroidism of renal origin ([place]) 02/20/2020  . Anhedonia 02/20/2020  . Seizure ([place]) 03/18/2019  . Transient atrial fibrillation ([place]) 03/18/2019  . MCI (mild cognitive impairment) 03/18/2019  . History of gout 04/29/2017  . OA (osteoarthritis) of knee 12/17/2016  . Subcortical infarction ([place]) 06/13/2016  . Health maintenance examination 01/30/2016  . Medicare annual wellness visit, subsequent 03/10/2014  . Advanced care planning/counseling discussion 03/10/2014  . PAD (peripheral artery disease) ([place]) 11/11/2013  . Dyspnea 09/18/2011  . PMR (polymyalgia rheumatica) ([place]) 08/27/2011  . Type 2 diabetes mellitus ([place]) 08/04/2009  . Essential hypertension 06/13/2009  . Chronic kidney disease 01/24/2009  . Primary angle-closure glaucoma 11/13/2007  . Osteopenia 11/13/2007  . History of colon cancer 11/13/2007  . Hyperglycemia due to type 2 diabetes mellitus ([place]) 10/23/2006    Immunization History  Administered Date(s) Administered  . Fluad Quad(high Dose 65+) 03/03/2019, 02/19/2020  . Influenza Split 03/16/2011, 03/12/2012  . Influenza Whole 06/04/2002, 04/24/2007, 03/10/2008, 03/31/2009, 04/18/2010  . Influenza, High Dose Seasonal PF 03/18/2013, 03/02/2015  . Influenza,inj,Quad PF,6+ Mos 03/10/2014, 01/30/2016, 02/07/2017, 02/18/2018  . PFIZER(Purple Top)SARS-COV-2 Vaccination 08/31/2019, 09/23/2019   . Pneumococcal Conjugate-13 03/10/2014  . Pneumococcal Polysaccharide-23 03/23/2011  . Td 06/08/1996  . Zoster 06/05/2007    Conditions to be addressed/monitored:  Hypertension, Hyperlipidemia and Diabetes  Care Plan : [place]  Updates made by [name], [facility] since 08/16/2020 12:00 AM    Problem: CHL AMB "PATIENT-SPECIFIC PROBLEM"     Long-Range Goal: Patient Stated   Start Date: 08/08/2020  Priority: High  Note:    Current Barriers:   Recent stroke, medication changes reviewed  Pharmacist Clinical Goal(s):  Marland Kitchen Over the next 90 days, patient will contact provider office for questions/concerns as evidenced notation of same in electronic health record through collaboration with PharmD and provider.   Interventions: . 1:1 collaboration with [name], MD regarding development and update of comprehensive plan of care as evidenced by provider attestation and co-signature . Inter-disciplinary care team collaboration (see longitudinal plan of care) . Comprehensive medication review performed; medication list updated in electronic medical record  Hypertension (BP goal <140/90) -Controlled -Current treatment: . Carvedilol 12.5 mg - 1 tablet BID . Losartan 25 mg - 1 tablet daily -Medications previously tried: Ramipril  -Current home readings: per report to PCP on 2/25 --> 108-160s/70-80s, HR 60-70s. Denies concerns/changes today. -Denies hypotensive/hypertensive symptoms -Recommended to continue current medication; Monitor BP at home with symptoms, document, and provide log at future appointments  Hyperlipidemia: (LDL goal < 70) -Controlled -Current treatment: . Atorvastatin 40 mg - 1 tablet daily . Aspirin 325 mg - 1 tablet daily until September 02, 2020 then stop . Plavix 75 mg - 1 tablet daily -Educated on: per neuro, patient to switch to Plavix alone on September 02, 2020 -Recommended to continue current medication  Diabetes (A1c goal <8%) -Controlled -  A1c 7.9%, followed by endocrinology -Current medications: . Lantus - Inject 8 units daily  -Medications previously tried: Humalog d/c in hospital 05/30/20 -Current home glucose readings - did not have log today -Log last reviewed 06/24/20 and fasting BG within goal, no hypoglycemia  -Denies hypoglycemic/hyperglycemic symptoms -Recommended to continue current medication; Check feet daily and complete yearly eye exams  Caregiver concerns - discussed hair loss. Caregiver asked if okay to start Biotin. Agree  okay to start. Counseled that onset of improvement may take several months. Added to med list. No drug interactions. No history of thyroid disease. Hold 2 days prior to lab if evaluating thyroid function.  Patient Goals/Self-Care Activities . Over the next 90 days, patient will:  - take medications as prescribed check glucose daily, document, and provide at future appointments check blood pressure weekly, document, and provide at future appointments  Follow Up Plan: The care management team will reach out to the patient again over the next 90 days for BG and BP review     Medication Assistance: None required.  Patient affirms current coverage meets needs.  Patient's preferred pharmacy is:  [facility] [address] [place] [address] [place] [address] Phone: [phone] Fax: [phone]  Uses pill box? Yes  Care Plan and Follow Up Patient Decision:  Patient agrees to Care Plan and Follow-up.  [name], PharmD Clinical Pharmacist [facility] at [facility] [phone]

## 2020-08-10 ENCOUNTER — Encounter: Payer: Medicare Other | Admitting: Physician Assistant

## 2020-08-10 ENCOUNTER — Other Ambulatory Visit: Payer: Self-pay

## 2020-08-10 DIAGNOSIS — E11621 Type 2 diabetes mellitus with foot ulcer: Secondary | ICD-10-CM | POA: Diagnosis not present

## 2020-08-10 DIAGNOSIS — L89620 Pressure ulcer of left heel, unstageable: Secondary | ICD-10-CM | POA: Diagnosis not present

## 2020-08-10 NOTE — Progress Notes (Addendum)
EVEA, SHEEK ([id number]) Visit Report for 08/10/2020 Chief Complaint Document Details Patient Name: Taylor Castaneda, Taylor Castaneda. Date of Service: 08/10/2020 9:00 AM Medical Record Number: [id number] Patient Account Number: [id number] Date of Birth/Sex: July 13, 1932 (87 y.o. F) Treating RN: Cornell Barman Primary Care Provider: [name] Other Clinician: Jeanine Luz Referring Provider: [name] Treating Provider/Extender: Skipper Cliche in Treatment: 1 Information Obtained from: Patient Chief Complaint 08/03/2020; patient is here for review of a wound on her left Achilles heel Electronic Signature(s) Signed: 08/10/2020 9:28:03 AM By: Worthy Keeler PA-C Entered By: Worthy Keeler on 08/10/2020 09:28:03 Taylor Castaneda ([id number]) -------------------------------------------------------------------------------- Debridement Details Patient Name: Taylor Castaneda Date of Service: 08/10/2020 9:00 AM Medical Record Number: [id number] Patient Account Number: [id number] Date of Birth/Sex: 05-10-1933 (87 y.o. F) Treating RN: Cornell Barman Primary Care Provider: [name] Other Clinician: Jeanine Luz Referring Provider: [name] Treating Provider/Extender: Skipper Cliche in Treatment: 1 Debridement Performed for Wound #1 Left Calcaneus Assessment: Performed By: Physician Tommie Sams., PA-C Debridement Type: Chemical/Enzymatic/Mechanical Agent Used: Santyl Level of Consciousness (Pre- Awake and Alert procedure): Pre-procedure Verification/Time Out Yes - 09:15 Taken: Instrument: Other : tongue blade Bleeding: None Response to Treatment: Procedure was tolerated well Level of Consciousness (Post- Awake and Alert procedure): Post Debridement Measurements of Total Wound Length: (cm) 2.5 Stage: Unstageable/Unclassified Width: (cm) 2.3 Depth: (cm) 0.1 Volume: (cm) 0.452 Character of Wound/Ulcer Post Debridement: Stable Post Procedure Diagnosis Same as  Pre-procedure Electronic Signature(s) Signed: 08/10/2020 5:32:24 PM By: Gretta Cool, BSN, RN, CWS, Kim RN, BSN Signed: 08/10/2020 6:23:15 PM By: Worthy Keeler PA-C Entered By: Gretta Cool, BSN, RN, CWS, Kim on 08/10/2020 09:45:45 Taylor Castaneda ([id number]) -------------------------------------------------------------------------------- HPI Details Patient Name: Taylor Castaneda Date of Service: 08/10/2020 9:00 AM Medical Record Number: [id number] Patient Account Number: [id number] Date of Birth/Sex: Aug 05, 1932 (87 y.o. F) Treating RN: Cornell Barman Primary Care Provider: [name] Other Clinician: Jeanine Luz Referring Provider: [name] Treating Provider/Extender: Skipper Cliche in Treatment: 1 History of Present Illness HPI Description: ADMISSION 08/03/2020 This is a 87 year old woman who was in the hospital last month suffering a right CVA with mild residual left-sided symptoms. Sometime during this hospitalization or shortly after she got home they noted that she had an ulcer on her left heel. Saw her primary doctor on 2/25 who correctly pointed out this was unstageable and referred her here. Patient does not complain of any pain. She has home health [[facility]] but we are not exactly sure what they are currently using for wound dressing. Past medical history includes hypertension, type 2 diabetes, stage IIIb chronic renal failure, recent right CVA is noted, laparoscopic cholecystectomy also last month, gout, seizures ABI in our clinic was 0.71 on the left 08/10/2020 upon evaluation today patient appears to be doing well with regard to her heel ulcer in my opinion. Fortunately there does not appear to be any signs of active infection at this time. No fever chills noted She does have an appointment or rather referral to get an appointment with vascular hopefully we can get that scheduled soon they called and left a message that has not been set up yet though were given the  information for them to call the care of that today. Electronic Signature(s) Signed: 08/10/2020 9:45:03 AM By: Worthy Keeler PA-C Entered By: Worthy Keeler on 08/10/2020 09:45:02 Taylor Castaneda ([id number]) -------------------------------------------------------------------------------- Physical Exam Details Patient Name: Taylor Castaneda Date of Service: 08/10/2020 9:00 AM Medical Record Number: [id number] Patient Account  Number: [id number] Date of Birth/Sex: 02-Feb-1933 (87 y.o. F) Treating RN: Cornell Barman Primary Care Provider: [name] Other Clinician: Jeanine Luz Referring Provider: [name] Treating Provider/Extender: Jeri Cos Weeks in Treatment: 1 Constitutional Well-nourished and well-hydrated in no acute distress. Respiratory normal breathing without difficulty. Psychiatric this patient is able to make decisions and demonstrates good insight into disease process. Alert and Oriented x 3. pleasant and cooperative. Notes Upon inspection patient's wound bed actually showed signs of good improvement as far as the heel is concerned. There does not appear to be any evidence of active infection which is great news and I do feel like that the eschar is softening up which is also great news. No sharp debridement was performed due to the arterial status not being fully known at this point. We will see how things move forward. Electronic Signature(s) Signed: 08/10/2020 9:45:29 AM By: Worthy Keeler PA-C Entered By: Worthy Keeler on 08/10/2020 09:45:29 Taylor Castaneda ([id number]) -------------------------------------------------------------------------------- Physician Orders Details Patient Name: Taylor Castaneda Date of Service: 08/10/2020 9:00 AM Medical Record Number: [id number] Patient Account Number: [id number] Date of Birth/Sex: 05-May-1933 (87 y.o. F) Treating RN: Cornell Barman Primary Care Provider: [name] Other Clinician: Jeanine Luz Referring Provider: [name] Treating Provider/Extender: Skipper Cliche in Treatment: 1 Verbal / Phone Orders: No Diagnosis Coding ICD-10 Coding Code Description E11.621 Type 2 diabetes mellitus with foot ulcer L89.620 Pressure ulcer of left heel, unstageable E11.51 Type 2 diabetes mellitus with diabetic peripheral angiopathy without gangrene Follow-up Appointments Wound #1 Left Calcaneus o Return Appointment in 1 week. Home Health Wound #1 Left [place]: - [facility]-established patient o [place] for wound care. May utilize formulary equivalent dressing for wound treatment orders unless otherwise specified. Home Health Nurse may visit PRN to address patientos wound care needs. o Scheduled days for dressing changes to be completed; exception, patient has scheduled wound care visit that day. o **Please direct any NON-WOUND related issues/requests for orders to patient's Primary Care Physician. **If current dressing causes regression in wound condition, may D/C ordered dressing product/s and apply Normal Saline Moist Dressing daily until next [place] or Other MD appointment. **Notify [facility] of regression in wound condition at [phone]. Bathing/ Shower/ Hygiene o May shower; gently cleanse wound with antibacterial soap, rinse and pat dry prior to dressing wounds Off-Loading o Other: - Float heels to keep pressure off of them. Additional Orders / Instructions o Follow Nutritious Diet and Increase Protein Intake Medications-Please add to medication list. o Santyl Enzymatic Ointment - If Santyl is not affordable, use Medihoney or Manuka Honey over the counter. Wound Treatment Wound #1 - Calcaneus Wound Laterality: Left Topical: Santyl Collagenase Ointment, 30 (gm), tube (Home Health) 1 x Per Day/30 Days Discharge Instructions: apply nickel thick to wound bed only Primary Dressing: Gauze  ([place]) 1 x Per Day/30 Days Discharge Instructions: As directed: dry, moistened with saline or moistened with Dakins Solution Secondary Dressing: Kerlix 4.5 x 4.1 (in/yd) (Home Health) 1 x Per Day/30 Days Discharge Instructions: Wrapped lightly due to blood flow. Secured With: 74M Medipore H Soft Cloth Surgical Tape, 2x2 (in/yd) (Home Health) 1 x Per Day/30 Days Electronic Signature(s) Signed: 08/10/2020 5:32:24 PM By: Gretta Cool, BSN, RN, CWS, Kim RN, BSN Signed: 08/10/2020 6:23:15 PM By: Worthy Keeler PA-C Entered By: Gretta Cool, BSN, RN, CWS, Kim on 08/10/2020 09:46:41 Taylor Castaneda ([id number], OLLIFF ([id number]) -------------------------------------------------------------------------------- Problem List Details Patient Name: ADALAYA, IRION  B. Date of Service: 08/10/2020 9:00 AM Medical Record Number: [id number] Patient Account Number: [id number] Date of Birth/Sex: June 28, 1932 (88 y.o. F) Treating RN: Cornell Barman Primary Care Provider: [name] Other Clinician: Jeanine Luz Referring Provider: [name] Treating Provider/Extender: Jeri Cos Weeks in Treatment: 1 Active Problems ICD-10 Encounter Code Description Active Date MDM Diagnosis E11.621 Type 2 diabetes mellitus with foot ulcer 08/03/2020 No Yes L89.620 Pressure ulcer of left heel, unstageable 08/03/2020 No Yes E11.51 Type 2 diabetes mellitus with diabetic peripheral angiopathy without 08/03/2020 No Yes gangrene Inactive Problems Resolved Problems Electronic Signature(s) Signed: 08/10/2020 9:27:41 AM By: Worthy Keeler PA-C Entered By: Worthy Keeler on 08/10/2020 09:27:41 Taylor Castaneda ([id number]) -------------------------------------------------------------------------------- Progress Note Details Patient Name: Taylor Castaneda Date of Service: 08/10/2020 9:00 AM Medical Record Number: [id number] Patient Account Number: [id number] Date of Birth/Sex: April 15, 1933 (88 y.o. F) Treating RN: Cornell Barman Primary Care Provider: [name] Other Clinician: Jeanine Luz Referring Provider: [name] Treating Provider/Extender: Skipper Cliche in Treatment: 1 Subjective Chief Complaint Information obtained from Patient 08/03/2020; patient is here for review of a wound on her left Achilles heel History of Present Illness (HPI) ADMISSION 08/03/2020 This is a 87 year old woman who was in the hospital last month suffering a right CVA with mild residual left-sided symptoms. Sometime during this hospitalization or shortly after she got home they noted that she had an ulcer on her left heel. Saw her primary doctor on 2/25 who correctly pointed out this was unstageable and referred her here. Patient does not complain of any pain. She has home health [[facility]] but we are not exactly sure what they are currently using for wound dressing. Past medical history includes hypertension, type 2 diabetes, stage IIIb chronic renal failure, recent right CVA is noted, laparoscopic cholecystectomy also last month, gout, seizures ABI in our clinic was 0.71 on the left 08/10/2020 upon evaluation today patient appears to be doing well with regard to her heel ulcer in my opinion. Fortunately there does not appear to be any signs of active infection at this time. No fever chills noted She does have an appointment or rather referral to get an appointment with vascular hopefully we can get that scheduled soon they called and left a message that has not been set up yet though were given the information for them to call the care of that today. Objective Constitutional Well-nourished and well-hydrated in no acute distress. Vitals Time Taken: 9:10 AM, Height: 60 in, Weight: 105 lbs, BMI: 20.5, Temperature: 97.6 F, Pulse: 66 bpm, Respiratory Rate: 18 breaths/min, Blood Pressure: 179/77 mmHg. Respiratory normal breathing without difficulty. Psychiatric this patient is able to make decisions and  demonstrates good insight into disease process. Alert and Oriented x 3. pleasant and cooperative. General Notes: Upon inspection patient's wound bed actually showed signs of good improvement as far as the heel is concerned. There does not appear to be any evidence of active infection which is great news and I do feel like that the eschar is softening up which is also great news. No sharp debridement was performed due to the arterial status not being fully known at this point. We will see how things move forward. Integumentary (Hair, Skin) Wound #1 status is Open. Original cause of wound was Gradually Appeared. The date acquired was: 06/22/2020. The wound has been in treatment 1 weeks. The wound is located on the Left Calcaneus. The wound measures 2.5cm length x 2.3cm width x 0.1cm depth; 4.516cm^2 area and 0.452cm^3 volume.  There is Fat Layer (Subcutaneous Tissue) exposed. There is no tunneling or undermining noted. There is a medium amount of serous drainage noted. There is small (1-33%) red, pink granulation within the wound bed. There is a large (67-100%) amount of necrotic tissue within the wound bed including Adherent Slough. LATRISH, MOGEL ([id number]) Assessment Active Problems ICD-10 Type 2 diabetes mellitus with foot ulcer Pressure ulcer of left heel, unstageable Type 2 diabetes mellitus with diabetic peripheral angiopathy without gangrene Plan 1. I would recommend currently that we go ahead and continue with the Santyl I think this the best option for the heel. 2. I am also can recommend that we have the patient go ahead and continue with the appropriate offloading and we did recommend a Prevalon offloading boot which I think does a good job. 3. I am also going to recommend that the patient should get scheduled with the vascular office as soon as possible. They are again to give them a call as we gave the number to the patient's son today. 4. I would also recommend that in regard to  the sacral region she should not sit for long periods of time I would recommend getting up and walking around at least once an hour to prevent any breakdown here although it does not appear to be broken down at this point. Otherwise I suggested a and D ointment over this area. We will see patient back for reevaluation in 1 week here in the clinic. If anything worsens or changes patient will contact our office for additional recommendations. Electronic Signature(s) Signed: 08/10/2020 9:46:29 AM By: Worthy Keeler PA-C Entered By: Worthy Keeler on 08/10/2020 09:46:29 Taylor Castaneda ([id number]) -------------------------------------------------------------------------------- SuperBill Details Patient Name: Taylor Castaneda Date of Service: 08/10/2020 Medical Record Number: [id number] Patient Account Number: [id number] Date of Birth/Sex: [birth year] (88 y.o. F) Treating RN: Cornell Barman Primary Care Provider: [name] Other Clinician: Jeanine Luz Referring Provider: [name] Treating Provider/Extender: Jeri Cos Weeks in Treatment: 1 Diagnosis Coding ICD-10 Codes Code Description [phone] Type 2 diabetes mellitus with foot ulcer L89.620 Pressure ulcer of left heel, unstageable E11.51 Type 2 diabetes mellitus with diabetic peripheral angiopathy without gangrene Facility Procedures CPT4 Code: [id number] Description: [phone] - [facility] W/O ANES NON SELECT Modifier: Quantity: 1 Physician Procedures CPT4 Code: [id number] Description: [id number] - WC PHYS LEVEL 3 - EST PT Modifier: Quantity: 1 CPT4 Code: Description: ICD-10 Diagnosis Description E11.621 Type 2 diabetes mellitus with foot ulcer L89.620 Pressure ulcer of left heel, unstageable E11.51 Type 2 diabetes mellitus with diabetic peripheral angiopathy without Modifier: gangrene Quantity: Electronic Signature(s) Signed: 08/10/2020 9:46:41 AM By: Worthy Keeler PA-C Entered By: Worthy Keeler on 08/10/2020 09:46:41

## 2020-08-10 NOTE — Progress Notes (Signed)
[name], [name] ([id number]) Visit Report for 08/10/2020 Arrival Information Details Patient Name: Taylor Castaneda, Taylor Castaneda. Date of Service: 08/10/2020 9:00 AM Medical Record Number: [id number] Patient Account Number: [id number] Date of Birth/Sex: [birth year] (88 y.o. F) Treating RN: Cornell Barman Primary Care [name]: [name] Other Clinician: Jeanine Luz Referring Hallis Meditz: [name] Treating Margurite Duffy/Extender: Skipper Cliche in Treatment: 1 Visit Information History Since Last Visit Added or deleted any medications: No Patient Arrived: Wheel Chair Had a fall or experienced change in No Arrival Time: 09:13 activities of daily living that may affect Accompanied By: son risk of falls: Transfer Assistance: None Hospitalized since last visit: No Patient Identification Verified: Yes Pain Present Now: No Secondary Verification Process Completed: Yes Patient Has Alerts: Yes Patient Alerts: Patient on Blood Thinner Type I Diabetic Electronic Signature(s) Signed: 08/10/2020 3:31:48 PM By: Jeanine Luz Entered By: Jeanine Luz on 08/10/2020 09:13:42 Taylor Castaneda ([id number]) -------------------------------------------------------------------------------- Encounter Discharge Information Details Patient Name: Taylor Castaneda Date of Service: 08/10/2020 9:00 AM Medical Record Number: [id number] Patient Account Number: [id number] Date of Birth/Sex: Aug 29, 1932 (88 y.o. F) Treating RN: Cornell Barman Primary Care Damel Querry: [name] Other Clinician: Jeanine Luz Referring Aaniya Sterba: [name] Treating Jennah Satchell/Extender: Skipper Cliche in Treatment: 1 Encounter Discharge Information Items Post Procedure Vitals Discharge Condition: Stable Temperature (F): 97.6 Ambulatory Status: Wheelchair Pulse (bpm): 66 Discharge Destination: Home Respiratory Rate (breaths/min): 18 Transportation: Private Auto Blood Pressure (mmHg): 179/77 Accompanied By:  self Schedule Follow-up Appointment: Yes Clinical Summary of Care: Notes Patient to follow-up with PCP regarding BP. Electronic Signature(s) Signed: 08/10/2020 5:32:24 PM By: Gretta Cool, BSN, RN, CWS, Kim RN, BSN Entered By: Gretta Cool, BSN, RN, CWS, Kim on 08/10/2020 09:50:07 Taylor Castaneda ([id number]) -------------------------------------------------------------------------------- Lower Extremity Assessment Details Patient Name: Taylor Castaneda, Taylor Castaneda Date of Service: 08/10/2020 9:00 AM Medical Record Number: [id number] Patient Account Number: [id number] Date of Birth/Sex: 10/15/1932 (88 y.o. F) Treating RN: Cornell Barman Primary Care Tayler Heiden: [name] Other Clinician: Jeanine Luz Referring Anja Neuzil: [name] Treating Jackalynn Art/Extender: Jeri Cos Weeks in Treatment: 1 Edema Assessment Assessed: [Left: Yes] [Right: No] Edema: [Left: Ye] [Right: s] Calf Left: Right: Point of Measurement: 34 cm From Medial Instep 25 cm Ankle Left: Right: Point of Measurement: 8 cm From Medial Instep 21 cm Vascular Assessment Pulses: Dorsalis Pedis Palpable: [Left:Yes] Electronic Signature(s) Signed: 08/10/2020 3:31:48 PM By: Jeanine Luz Signed: 08/10/2020 5:32:24 PM By: Gretta Cool, BSN, RN, CWS, Kim RN, BSN Entered By: Jeanine Luz on 08/10/2020 09:21:52 Taylor Castaneda ([id number]) -------------------------------------------------------------------------------- Multi Wound Chart Details Patient Name: Taylor Castaneda Date of Service: 08/10/2020 9:00 AM Medical Record Number: [id number] Patient Account Number: [id number] Date of Birth/Sex: 31-Mar-1933 (88 y.o. F) Treating RN: Cornell Barman Primary Care Joeseph Verville: [name] Other Clinician: Jeanine Luz Referring Biannca Scantlin: [name] Treating Otis Burress/Extender: Jeri Cos Weeks in Treatment: 1 Vital Signs Height(in): 60 Pulse(bpm): 56 Weight(lbs): 105 Blood Pressure(mmHg): 179/77 Body Mass Index(BMI):  21 Temperature(F): 97.6 Respiratory Rate(breaths/min): 18 Photos: [1:No Photos] [N/A:N/A] Wound Location: [1:Left Calcaneus] [N/A:N/A] Wounding Event: [1:Gradually Appeared] [N/A:N/A] Primary Etiology: [1:Pressure Ulcer] [N/A:N/A] Comorbid History: [1:Cataracts, Hypertension, Myocardial N/A Infarction, Peripheral Arterial Disease, Type I Diabetes, End Stage Renal Disease, Gout, Seizure Disorder] Date Acquired: [1:06/22/2020] [N/A:N/A] Weeks of Treatment: [1:1] [N/A:N/A] Wound Status: [1:Open] [N/A:N/A] Measurements L x W x D (cm) [1:2.5x2.3x0.1] [N/A:N/A] Area (cm) : [1:4.516] [N/A:N/A] Volume (cm) : [1:0.452] [N/A:N/A] % Reduction in Area: [1:14.80%] [N/A:N/A] % Reduction in Volume: [1:14.70%] [N/A:N/A] Classification: [1:Unstageable/Unclassified] [N/A:N/A] Exudate Amount: [1:Medium] [N/A:N/A] Exudate Type: [1:Serous] [N/A:N/A] Exudate Color: [1:amber] [  N/A:N/A] Granulation Amount: [1:Small (1-33%)] [N/A:N/A] Granulation Quality: [1:Red, Pink] [N/A:N/A] Necrotic Amount: [1:Large (67-100%)] [N/A:N/A] Exposed Structures: [1:Fat Layer (Subcutaneous Tissue): N/A Yes Fascia: No Tendon: No Muscle: No Joint: No Bone: No] Treatment Notes Electronic Signature(s) Signed: 08/10/2020 5:32:24 PM By: Gretta Cool, BSN, RN, CWS, Kim RN, BSN Entered By: Gretta Cool, BSN, RN, CWS, Kim on 08/10/2020 09:34:43 Taylor Castaneda ([id number]) -------------------------------------------------------------------------------- [place] Plan Details Patient Name: Taylor Castaneda Date of Service: 08/10/2020 9:00 AM Medical Record Number: [id number] Patient Account Number: [id number] Date of Birth/Sex: 24-Feb-1933 (88 y.o. F) Treating RN: Cornell Barman Primary Care Suella Cogar: [name] Other Clinician: Jeanine Luz Referring Ramie Erman: [name] Treating Akeen Ledyard/Extender: Skipper Cliche in Treatment: 1 Active Inactive Medication Nursing Diagnoses: Knowledge deficit related to  medication safety: actual or potential Goals: Patient/caregiver will demonstrate understanding of all current medications Date Initiated: 08/03/2020 Target Resolution Date: 08/17/2020 Goal Status: Active Patient/caregiver will demonstrate understanding of new oral/IV medications prescribed at the [facility] (topical prescriptions are covered under the skin breakdown problem) Date Initiated: 08/03/2020 Target Resolution Date: 08/17/2020 Goal Status: Active Interventions: Assess for medication contraindications each visit where new medications are prescribed Assess patient/caregiver ability to manage medication regimen upon admission and as needed Patient/Caregiver given reconciled medication list upon admission, changes in medications and discharge from the [place] education on medication safety Treatment Activities: New medication prescribed at [place] : 08/03/2020 Notes: Necrotic Tissue Nursing Diagnoses: Impaired tissue integrity related to necrotic/devitalized tissue Knowledge deficit related to management of necrotic/devitalized tissue Goals: Necrotic/devitalized tissue will be minimized in the wound bed Date Initiated: 08/03/2020 Target Resolution Date: 08/17/2020 Goal Status: Active Patient/caregiver will verbalize understanding of reason and process for debridement of necrotic tissue Date Initiated: 08/03/2020 Target Resolution Date: 08/17/2020 Goal Status: Active Interventions: Assess patient pain level pre-, during and post procedure and prior to discharge Provide education on necrotic tissue and debridement process Treatment Activities: Apply topical anesthetic as ordered : 08/03/2020 Excisional debridement : 08/03/2020 Notes: Orientation to the Wound Care Program Nursing Diagnoses: Knowledge deficit related to the wound healing center program Taylor Castaneda, Taylor Castaneda ([id number]) Goals: Patient/caregiver will verbalize understanding of the [place] Program Date  Initiated: 08/03/2020 Target Resolution Date: 08/17/2020 Goal Status: Active Interventions: Provide education on orientation to the wound center Notes: Wound/Skin Impairment Nursing Diagnoses: Impaired tissue integrity Knowledge deficit related to smoking impact on wound healing Knowledge deficit related to ulceration/compromised skin integrity Goals: Patient/caregiver will verbalize understanding of skin care regimen Date Initiated: 08/03/2020 Target Resolution Date: 08/17/2020 Goal Status: Active Ulcer/skin breakdown will have a volume reduction of 30% by week 4 Date Initiated: 08/03/2020 Target Resolution Date: 09/03/2020 Goal Status: Active Interventions: Assess patient/caregiver ability to obtain necessary supplies Assess patient/caregiver ability to perform ulcer/skin care regimen upon admission and as needed Assess ulceration(s) every visit Treatment Activities: Patient referred to home care : 08/03/2020 Skin care regimen initiated : 08/03/2020 Topical wound management initiated : 08/03/2020 Notes: Electronic Signature(s) Signed: 08/10/2020 5:32:24 PM By: Gretta Cool, BSN, RN, CWS, Kim RN, BSN Entered By: Gretta Cool, BSN, RN, CWS, Kim on 08/10/2020 09:34:34 Taylor Castaneda ([id number]) -------------------------------------------------------------------------------- Pain Assessment Details Patient Name: Taylor Castaneda Date of Service: 08/10/2020 9:00 AM Medical Record Number: [id number] Patient Account Number: [id number] Date of Birth/Sex: 22-Mar-1933 (88 y.o. F) Treating RN: Cornell Barman Primary Care Hasnain Manheim: [name] Other Clinician: Jeanine Luz Referring Donie Moulton: [name] Treating Emma Schupp/Extender: Skipper Cliche in Treatment: 1 Active Problems Location of Pain Severity and Description of Pain Patient Has Paino No  Site Locations Pain Management and Medication Current Pain Management: Electronic Signature(s) Signed: 08/10/2020 3:31:48 PM By: Jeanine Luz Signed: 08/10/2020 5:32:24 PM By: Gretta Cool, BSN, RN, CWS, Kim RN, BSN Entered By: Jeanine Luz on 08/10/2020 09:14:08 Taylor Castaneda ([id number]) -------------------------------------------------------------------------------- Patient/Caregiver Education Details Patient Name: Taylor Castaneda Date of Service: 08/10/2020 9:00 AM Medical Record Number: [id number] Patient Account Number: [id number] Date of Birth/Gender: June 26, 1932 (88 y.o. F) Treating RN: Cornell Barman Primary Care Physician: [name] Other Clinician: Jeanine Luz Referring Physician: [name] Treating Physician/Extender: Skipper Cliche in Treatment: 1 Education Assessment Education Provided To: Patient Education Topics Provided Pressure: Handouts: Pressure Ulcers: Care and Offloading, Preventing Pressure Ulcers Methods: Demonstration, Explain/Verbal Responses: State content correctly Safety: Electronic Signature(s) Signed: 08/10/2020 5:32:24 PM By: Gretta Cool, BSN, RN, CWS, Kim RN, BSN Entered By: Gretta Cool, BSN, RN, CWS, Kim on 08/10/2020 09:47:19 Taylor Castaneda ([id number]) -------------------------------------------------------------------------------- Wound Assessment Details Patient Name: Taylor Castaneda Date of Service: 08/10/2020 9:00 AM Medical Record Number: [id number] Patient Account Number: [id number] Date of Birth/Sex: Jan 16, 1933 (88 y.o. F) Treating RN: Cornell Barman Primary Care Britnay Magnussen: [name] Other Clinician: Jeanine Luz Referring Mariachristina Holle: [name] Treating Valla Pacey/Extender: Jeri Cos Weeks in Treatment: 1 Wound Status Wound Number: 1 Primary Pressure Ulcer Etiology: Wound Location: Left Calcaneus Wound Open Wounding Event: Gradually Appeared Status: Date Acquired: 06/22/2020 Comorbid Cataracts, Hypertension, Myocardial Infarction, Peripheral Weeks Of Treatment: 1 History: Arterial Disease, Type I Diabetes, End Stage Renal Clustered Wound:  No Disease, Gout, Seizure Disorder Photos Wound Measurements Length: (cm) 2.5 % Reduc Width: (cm) 2.3 % Reduc Depth: (cm) 0.1 [place] Area: (cm) 4.516 Underm Volume: (cm) 0.452 tion in Area: 14.8% tion in Volume: 14.7% ng: No ining: No Wound Description Classification: Unstageable/Unclassified Foul O Exudate Amount: Medium Slough Exudate Type: Serous Exudate Color: amber dor After Cleansing: No /Fibrino Yes Wound Bed Granulation Amount: Small (1-33%) Exposed Structure Granulation Quality: Red, Pink Fascia Exposed: No Necrotic Amount: Large (67-100%) Fat Layer (Subcutaneous Tissue) Exposed: Yes Necrotic Quality: Adherent Slough Tendon Exposed: No Muscle Exposed: No Joint Exposed: No Bone Exposed: No Treatment Notes Wound #1 (Calcaneus) Wound Laterality: Left Cleanser Peri-Wound Care Topical Santyl Collagenase Ointment, 30 (gm), tube Taylor Castaneda, Taylor Castaneda ([id number]) Discharge Instruction: apply nickel thick to wound bed only Primary Dressing Gauze Discharge Instruction: As directed: dry, moistened with saline or moistened with Dakins Solution Secondary Dressing Kerlix 4.5 x 4.1 (in/yd) Discharge Instruction: Wrapped lightly due to blood flow. Secured With 74M Medipore H Soft Cloth Surgical Tape, 2x2 (in/yd) Compression Wrap Compression Stockings Add-Ons Electronic Signature(s) Signed: 08/10/2020 12:09:52 PM By: Carlene Coria RN Signed: 08/10/2020 5:32:24 PM By: Gretta Cool, BSN, RN, CWS, Kim RN, BSN Entered By: Carlene Coria on 08/10/2020 10:29:19 Taylor Castaneda ([id number]) -------------------------------------------------------------------------------- Vitals Details Patient Name: Taylor Castaneda Date of Service: 08/10/2020 9:00 AM Medical Record Number: [id number] Patient Account Number: [id number] Date of Birth/Sex: Apr 13, 1933 (88 y.o. F) Treating RN: Cornell Barman Primary Care Matalyn Nawaz: [name] Other Clinician: Jeanine Luz Referring Weronika Birch: [name] Treating Anysha Frappier/Extender: Skipper Cliche in Treatment: 1 Vital Signs Time Taken: 09:10 Temperature (F): 97.6 Height (in): 60 Pulse (bpm): 66 Weight (lbs): 105 Respiratory Rate (breaths/min): 18 Body Mass Index (BMI): 20.5 Blood Pressure (mmHg): 179/77 Reference Range: 80 - 120 mg / dl Electronic Signature(s) Signed: 08/10/2020 3:31:48 PM By: Jeanine Luz Entered By: Jeanine Luz on 08/10/2020 09:14:02

## 2020-08-12 ENCOUNTER — Encounter: Payer: Medicare Other | Attending: Physical Medicine & Rehabilitation | Admitting: Physical Medicine & Rehabilitation

## 2020-08-12 ENCOUNTER — Other Ambulatory Visit (INDEPENDENT_AMBULATORY_CARE_PROVIDER_SITE_OTHER): Payer: Self-pay | Admitting: Vascular Surgery

## 2020-08-12 ENCOUNTER — Encounter: Payer: Self-pay | Admitting: Physical Medicine & Rehabilitation

## 2020-08-12 ENCOUNTER — Other Ambulatory Visit: Payer: Self-pay

## 2020-08-12 VITALS — BP 124/68 | HR 71 | Temp 98.2°F | Ht 60.0 in | Wt 106.0 lb

## 2020-08-12 DIAGNOSIS — S81809A Unspecified open wound, unspecified lower leg, initial encounter: Secondary | ICD-10-CM

## 2020-08-12 DIAGNOSIS — I639 Cerebral infarction, unspecified: Secondary | ICD-10-CM | POA: Insufficient documentation

## 2020-08-12 DIAGNOSIS — L89623 Pressure ulcer of left heel, stage 3: Secondary | ICD-10-CM | POA: Diagnosis not present

## 2020-08-12 NOTE — Progress Notes (Signed)
Subjective:    Patient ID: Taylor Castaneda, female    DOB: 06-06-1932, 87 y.o.   MRN: [id number] 87 y.o. right-handed female with history of diabetes mellitus hyperlipidemia seizure disorder maintained on Keppra history of colon cancer status post resection 1998 by Dr. Rosana Hoes, [place] followed by Dr. Herbert Deaner and chronic anemia..  Per chart review lives alone independent with assistive device.  1 level home 2 steps to entry.  She does have good family support.  Presented to [facility] 05/30/2020 after being found down with complaints of abdominal pain right upper quadrant, epigastrium that radiated around to the ipsilateral scapula and shoulder.  There was no nausea or vomiting.  No obvious trauma.  There was some question of possible seizure.  Cranial CT scan and CT cervical spine no evidence of acute abnormality.  CT thoracic lumbar cervical spine again showed no acute osseous abnormality identified.  There was severe stenosis at L4-5.  Ultrasound the abdomen cholelithiasis without significant sonographic evidence of acute cholecystitis.  MRI of the abdomen cholelithiasis but again no acute cholecystitis small common bile duct stones distally no common bile duct dilation.  Significant pancreatic atrophy.  Admission chemistries WBC 17,800 hemoglobin 11.4 glucose 205 BUN 37 creatinine 1.40 lactic acid 4.0 troponin 18 lipase 25 blood cultures no growth to date.  Patient underwent ERCP 05/31/2020 per Dr. Lyndel Safe showing choledocholithiasis found status post biliary sphincterotomy, sphincteroplasty and balloon extraction, cholelithiasis without cholecystitis no pus was found pancreatogram was not obtained.  General surgery consulted after ERCP completed underwent laparoscopic cholecystectomy 06/01/2020 per Dr. Windle Guard.  Neurology consulted EEG negative for seizure.  Echocardiogram with ejection fraction of 60 to 65% no wall motion abnormalities grade 2 diastolic dysfunction.  Patient with acute onset of aphasia again  neurology follow-up 06/02/2020 MRI showing multiple punctate foci of acute ischemia within the right centrum semiovale.  No hemorrhage or mass-effect.  MRA no large vessel occlusion identified.  Patient was cleared to begin aspirin 325 mg and Plavix for CVA prophylaxis x3 months then Plavix alone.  Subcutaneous heparin for DVT prophylaxis.  Tolerating a regular diet.  Due to patient decreased functional ability and aphasia she was admitted for a comprehensive rehab program.  Pertaining to patient's aphasia secondary to several punctate right centrum semiovale infarct likely due to severe intracranial stenosis remained followed closely by neurology services currently maintained on aspirin 325 mg daily and Plavix 75 mg daily x3 months then Plavix alone.  Follow-up neurology services 06/22/2019 due to increase aphasia as well as mild right arm and facial weakness that resolved quickly.  Follow-up EEG 06/21/2020 showed no seizure however there was suggestion of mild diffuse encephalopathy.  Follow-up MRI showed several foci of restricted diffusion within the deep white matter of the right frontal parietal region suggestive of watershed infarct.  Moderate parenchymal volume loss and probable chronic small vessel ischemia.  She remained on Keppra for seizure prophylaxis.  In regards to patient's cholelithiasis status post ERCP followed by laparoscopic cholecystectomy 06/01/2020 denied any pain or discomfort would follow-up with general surgery.  Blood pressure monitored closely avoiding any hypoperfusion currently maintained on  Coreg .  Low-dose Lasix discontinued due to mild elevation in BUN. Lipitor for hyperlipidemia.  Blood sugars controlled hemoglobin A1c 8.7 insulin therapy as directed.  Patient with a history of acute on chronic anemia GI follow-up as recommended currently hemoglobin stable 8.4.     Admit date: 06/10/2020 Discharge date: 06/24/2020  HPI   Chief complaint is constipation  87 year old female  with  history of cholecystitis, severe illness who developed perioperative right frontal watershed infarct.  The patient went through inpatient rehabilitation for approximately 2 weeks followed by home health therapy.  She is now independent with all her basic self-care such as dressing and toileting however needs some assistance with bathing meal preparation household duties and of course shopping.  She does not drive.  She does not climb steps.  She is now using a walker.  She used to use a cane outside the home but no assistive device in the home.  She has had a left heel ulcer and has seen primary care who referred her to wound care. Going to wound care at [facility] last visit 3/9  In terms of the constipation she has approximately 1 bowel movement per week.  She uses stool softeners but not on a regular basis.  Daughter is with her today and has questions about what would be the best way to help with her problems. Social the patient is widow she has 4 children ages 63 through 60.  one of her son stays with her.  Saw PCP Dr Danise Mina 2/25 BP control good, noted to have the heel ulcer and was referred to wound care  Has been seen by Dr Leta Baptist, neurology, recommendations are to continue on aspirin and Plavix until April 1 and then stop aspirin Pain Inventory Average Pain 0 Pain Right Now 0 My pain is no paon  In the last 24 hours, has pain interfered with the following? General activity 0 Relation with others 0 Enjoyment of life 0 What TIME of day is your pain at its worst? No pain Sleep (in general) Good  Pain is worse with: no pain Pain improves with:  no pain Relief from Meds: no pain meds  Family History  Problem Relation Age of Onset   Heart attack Father 94   Stroke Father 35   Diabetes Father    Hypertension Father    Heart disease Father        before age 92   Breast cancer Mother    Cancer Mother    Diabetes Sister    Cancer Sister    Peripheral vascular disease  Sister    Breast cancer Sister    Hypertension Son    COPD Neg Hx    Asthma Neg Hx    Colon cancer Neg Hx    Esophageal cancer Neg Hx    Rectal cancer Neg Hx    Stomach cancer Neg Hx    Social History   Socioeconomic History   Marital status: Widowed    Spouse name: Not on file   Number of children: 4   Years of education: Not on file   Highest education level: High school graduate  Occupational History   Occupation: retired    Fish farm manager: Retired    Comment: Regulatory affairs officer  Tobacco Use   Smoking status: Former Smoker    Years: 2.00    Types: Cigarettes    Quit date: 06/04/1973    Years since quitting: 47.2   Smokeless tobacco: Never Used   Tobacco comment: Smoked for 5 years 1965-1970 up to 1/2 pp week  Vaping Use   Vaping Use: Never used  Substance and Sexual Activity   Alcohol use: Not Currently   Drug use: No   Sexual activity: Never  Other Topics Concern   Not on file  Social History Narrative   07/25/20  Lives alone, no pets, has family with her 24/7   Neighbors keep  an eye on each other - 12 unit condo.    Family nearby.   Occ: retired Regulatory affairs officer   Activity: walking   Diet: good water, fruits/vegetables daily   Caffeine- some tea, occas soda   Social Determinants of Health   Financial Resource Strain: Not on file  Food Insecurity: Not on file  Transportation Needs: Not on file  Physical Activity: Not on file  Stress: Not on file  Social Connections: Not on file   Past Surgical History:  Procedure Laterality Date   ABI  11/2013   WNL   APPENDECTOMY     BILIARY DILATION  05/31/2020   Procedure: BILIARY DILATION;  Surgeon: Jackquline Denmark, MD;  Location: [facility] ENDOSCOPY;  Service: Endoscopy;;   BIOPSY  05/31/2020   Procedure: BIOPSY;  Surgeon: Jackquline Denmark, MD;  Location: [place];  Service: Endoscopy;;   CARPAL TUNNEL RELEASE     Bilateral    CATARACT EXTRACTION Bilateral 2006   Implants   CHOLECYSTECTOMY N/A 06/01/2020    Procedure: LAPAROSCOPIC CHOLECYSTECTOMY;  Surgeon: Clovis Riley, MD;  Location: [place] OR;  Service: General;  Laterality: N/A;   [place]   For cancer   COLONOSCOPY  2008   Dr.Stark, Due 2011   COLONOSCOPY  06/2013   1 hyperplastic polyp, ileocecal anastomosis Fuller Plan)   dexa  02/2012, 03/2014   T score -2.2 at hip overall stable   ENDOSCOPIC RETROGRADE CHOLANGIOPANCREATOGRAPHY (ERCP) WITH PROPOFOL N/A 05/31/2020   Procedure: ENDOSCOPIC RETROGRADE CHOLANGIOPANCREATOGRAPHY (ERCP) WITH PROPOFOL;  Surgeon: Jackquline Denmark, MD;  Location: [place];  Service: Endoscopy;  Laterality: N/A;   REFRACTIVE SURGERY     REMOVAL OF STONES  05/31/2020   Procedure: REMOVAL OF STONES;  Surgeon: Jackquline Denmark, MD;  Location[facility] ENDOSCOPY;  Service: Endoscopy;;   SPHINCTEROTOMY  05/31/2020   Procedure: [name];  Surgeon: Jackquline Denmark, MD;  Location: [place];  Service: Endoscopy;;   TONSILLECTOMY AND ADENOIDECTOMY     TOTAL KNEE ARTHROPLASTY Left 12/17/2016   Procedure: LEFT TOTAL KNEE ARTHROPLASTY;  Surgeon: Gaynelle Arabian, MD;  Location: [facility];  Service: Orthopedics;  Laterality: Left;   Past Surgical History:  Procedure Laterality Date   ABI  11/2013   WNL   APPENDECTOMY     BILIARY DILATION  05/31/2020   Procedure: BILIARY DILATION;  Surgeon: Jackquline Denmark, MD;  Location: [facility] ENDOSCOPY;  Service: Endoscopy;;   BIOPSY  05/31/2020   Procedure: BIOPSY;  Surgeon: Jackquline Denmark, MD;  Location[facility] ENDOSCOPY;  Service: Endoscopy;;   CARPAL TUNNEL RELEASE     Bilateral    CATARACT EXTRACTION Bilateral 2006   Implants   CHOLECYSTECTOMY N/A 06/01/2020   Procedure: LAPAROSCOPIC CHOLECYSTECTOMY;  Surgeon: Clovis Riley, MD;  Location: [place] OR;  Service: General;  Laterality: N/A;   [place]   For cancer   COLONOSCOPY  2008   Dr.Stark, Due 2011   COLONOSCOPY  06/2013   1 hyperplastic polyp, ileocecal anastomosis Fuller Plan)   dexa  02/2012, 03/2014   T score  -2.2 at hip overall stable   ENDOSCOPIC RETROGRADE CHOLANGIOPANCREATOGRAPHY (ERCP) WITH PROPOFOL N/A 05/31/2020   Procedure: ENDOSCOPIC RETROGRADE CHOLANGIOPANCREATOGRAPHY (ERCP) WITH PROPOFOL;  Surgeon: Jackquline Denmark, MD;  Location[facility] ENDOSCOPY;  Service: Endoscopy;  Laterality: N/A;   REFRACTIVE SURGERY     REMOVAL OF STONES  05/31/2020   Procedure: REMOVAL OF STONES;  Surgeon: Jackquline Denmark, MD;  Location: [facility] ENDOSCOPY;  Service: Endoscopy;;   SPHINCTEROTOMY  05/31/2020   Procedure: [name];  Surgeon: Jackquline Denmark,  MD;  Location: [place];  Service: Endoscopy;;   TONSILLECTOMY AND ADENOIDECTOMY     TOTAL KNEE ARTHROPLASTY Left 12/17/2016   Procedure: LEFT TOTAL KNEE ARTHROPLASTY;  Surgeon: Gaynelle Arabian, MD;  Location: [facility];  Service: Orthopedics;  Laterality: Left;   Past Medical History:  Diagnosis Date   Anemia    Cerebral infarction ([place]) 05/2020   Diabetes mellitus, type II ([place])    Glaucoma    Dr.Hecker   History of colon cancer 1998   Dr. Lennie Hummer   Hyperlipemia    Osteopenia 02/2012, 03/2014   DEXA hip -2.2   Renal insufficiency    Seizures ([facility])    Stenosing tenosynovitis of finger of left hand 2016   index - s/p steroid injection x2   Stroke ([place])    BP 124/68    Pulse 71    Temp 98.2 F (36.8 C)    Ht 5' (1.524 m)    Wt 106 lb (48.1 kg)    SpO2 98%    BMI 20.70 kg/m   Opioid Risk Score:   Fall Risk Score:  `1  Depression screen PHQ 2/9  Depression screen [facility] 2/9 07/05/2020 02/19/2020 02/19/2020 02/26/2019 02/18/2018 02/07/2017 01/30/2016  Decreased Interest 1 2 1  0 0 0 0  Down, Depressed, Hopeless 0 0 0 0 0 0 0  PHQ - 2 Score 1 2 1  0 0 0 0  Altered sleeping 2 2 - 0 0 0 -  Tired, decreased energy 2 2 - 0 0 0 -  Change in appetite 2 2 - 0 0 1 -  Feeling bad or failure about yourself  0 0 - 0 0 0 -  Trouble concentrating 1 0 - 0 0 0 -  Moving slowly or fidgety/restless 0 0 - 0 0 0 -  Suicidal thoughts 0 0 - 0 0 0 -  PHQ-9 Score 8 8 -  0 0 1 -  Difficult doing work/chores - - - Not difficult at all Not difficult at all Somewhat difficult -  Some recent data might be hidden    Review of Systems  Constitutional: Negative.   HENT: Negative.   Eyes: Negative.   Respiratory: Negative.   Gastrointestinal: Positive for constipation.  Endocrine: Negative.   Genitourinary: Negative.   Musculoskeletal: Positive for gait problem.  Skin: Positive for rash.  Allergic/Immunologic: Negative.   Psychiatric/Behavioral: Positive for confusion.  All other systems reviewed and are negative.      Objective:   Physical Exam Vitals reviewed.  Constitutional:      Appearance: She is normal weight.  HENT:     Head: Normocephalic and atraumatic.  Eyes:     Extraocular Movements: Extraocular movements intact.     Conjunctiva/sclera: Conjunctivae normal.     Pupils: Pupils are equal, round, and reactive to light.  Cardiovascular:     Rate and Rhythm: Normal rate and regular rhythm.     Heart sounds: Normal heart sounds.  Pulmonary:     Effort: Pulmonary effort is normal. No respiratory distress.     Breath sounds: Normal breath sounds.  Abdominal:     General: Abdomen is flat. Bowel sounds are normal.     Palpations: Abdomen is soft.  Skin:    General: Skin is warm and dry.  Neurological:     Mental Status: She is alert and oriented to person, place, and time.     Cranial Nerves: No dysarthria or facial asymmetry.     Motor: Motor function is  intact.     Coordination: Coordination is intact.     Gait: Gait abnormal.     Comments: Motor strength is 4+ bilateral deltoid, bicep, tricep, grip, hip flexor, knee extensor, ankle dorsiflexor and plantar flexor Sensation equal to light touch bilateral upper limbs and lower limbs No evidence of facial droop No evidence of dysarthria or aphasia  Ambulates with rolling walker she has normal step length wide-based support.  No evidence of toe drag or knee instability.  Psychiatric:         Mood and Affect: Mood normal.           Assessment & Plan:  #1.  Right MCA ACA watershed infarct perioperative currently on dual antiplatelet therapy and with plans to stop aspirin in approximately 3 weeks. The patient is doing well functionally, main difference compared to baseline is needing to use the walker for ambulation.  Home health is finishing up.  We discussed outpatient therapy which can be performed at [facility] which is closer to home for the patient and family.  The patient does not drive so family will need to discuss whether not they want to pursue this option.  Patient is doing well enough I do not think she needs physical medicine rehabilitation follow-up.  She will see primary care next week.  Certainly PCP can order outpatient physical therapy to work on gait training if needed and if the family wishes to pursue this.  Otherwise the family can call me and we can call in the orders.

## 2020-08-12 NOTE — Patient Instructions (Addendum)
Follow up with Dr Romana Juniper General Surgery   Stop Aspirin on September 02, 2020

## 2020-08-15 ENCOUNTER — Ambulatory Visit (INDEPENDENT_AMBULATORY_CARE_PROVIDER_SITE_OTHER): Payer: Medicare Other

## 2020-08-15 ENCOUNTER — Other Ambulatory Visit: Payer: Self-pay

## 2020-08-15 ENCOUNTER — Ambulatory Visit (INDEPENDENT_AMBULATORY_CARE_PROVIDER_SITE_OTHER): Payer: Medicare Other | Admitting: Nurse Practitioner

## 2020-08-15 ENCOUNTER — Encounter (INDEPENDENT_AMBULATORY_CARE_PROVIDER_SITE_OTHER): Payer: Self-pay | Admitting: Nurse Practitioner

## 2020-08-15 VITALS — BP 177/58 | HR 76 | Resp 16 | Wt 106.8 lb

## 2020-08-15 DIAGNOSIS — S81809A Unspecified open wound, unspecified lower leg, initial encounter: Secondary | ICD-10-CM

## 2020-08-15 DIAGNOSIS — L89620 Pressure ulcer of left heel, unstageable: Secondary | ICD-10-CM

## 2020-08-15 DIAGNOSIS — E7800 Pure hypercholesterolemia, unspecified: Secondary | ICD-10-CM | POA: Insufficient documentation

## 2020-08-15 DIAGNOSIS — I10 Essential (primary) hypertension: Secondary | ICD-10-CM

## 2020-08-15 DIAGNOSIS — I739 Peripheral vascular disease, unspecified: Secondary | ICD-10-CM

## 2020-08-15 DIAGNOSIS — G609 Hereditary and idiopathic neuropathy, unspecified: Secondary | ICD-10-CM | POA: Insufficient documentation

## 2020-08-15 DIAGNOSIS — R809 Proteinuria, unspecified: Secondary | ICD-10-CM | POA: Insufficient documentation

## 2020-08-15 DIAGNOSIS — Z794 Long term (current) use of insulin: Secondary | ICD-10-CM | POA: Insufficient documentation

## 2020-08-16 NOTE — Progress Notes (Signed)
Encounter details: CCM Time Spent       Value Time User   Time spent with patient (minutes)  60 08/16/2020 10:37 AM [name], RPH   Time spent performing Chart review  30 08/16/2020 10:37 AM [name], [facility]   Total time (minutes)  90 08/16/2020 10:37 AM [name], RPH      Moderate to High Complex Decision Making       Value Time User   Moderate to High complex decision making  Yes 08/16/2020 10:36 AM [name], [facility]      CCM Services: This encounter meets complex CCM services and moderate to high decision making.  Prior to outreach and patient consent for Chronic Care Management, I referred this patient for services after reviewing the nominated patient list or from a personal encounter with the patient.  I have personally reviewed this encounter including the documentation in this note and have collaborated with the care management provider regarding care management and care coordination activities to include development and update of the comprehensive care plan. I am certifying that I agree with the content of this note and encounter as supervising physician.

## 2020-08-16 NOTE — Patient Instructions (Signed)
Dear Taylor Castaneda,  Below is a summary of the goals we discussed during our follow up appointment on August 08, 2020. Please contact me anytime with questions or concerns.   Visit Information  Patient Care Plan: CCM Pharmacy Care Plan    Problem Identified: CHL AMB "PATIENT-SPECIFIC PROBLEM"     Long-Range Goal: Patient Stated   Start Date: 08/08/2020  Priority: High  Note:    Current Barriers:   Recent stroke, medication changes reviewed  Pharmacist Clinical Goal(s):  Marland Kitchen Over the next 90 days, patient will contact provider office for questions/concerns as evidenced notation of same in electronic health record through collaboration with PharmD and provider.   Interventions: . 1:1 collaboration with [name], MD regarding development and update of comprehensive plan of care as evidenced by provider attestation and co-signature . Inter-disciplinary care team collaboration (see longitudinal plan of care) . Comprehensive medication review performed; medication list updated in electronic medical record  Hypertension (BP goal <140/90) -Controlled -Current treatment: . Carvedilol 12.5 mg - 1 tablet BID . Losartan 25 mg - 1 tablet daily -Medications previously tried: Ramipril  -Current home readings: per report to PCP on 2/25 --> 108-160s/70-80s, HR 60-70s. Denies concerns/changes today. -Denies hypotensive/hypertensive symptoms -Recommended to continue current medication; Monitor BP at home with symptoms, document, and provide log at future appointments  Hyperlipidemia: (LDL goal < 70) -Controlled -Current treatment: . Atorvastatin 40 mg - 1 tablet daily . Aspirin 325 mg - 1 tablet daily until September 02, 2020 then stop . Plavix 75 mg - 1 tablet daily -Educated on: per neuro, patient to switch to Plavix alone on September 02, 2020 -Recommended to continue current medication  Diabetes (A1c goal <8%) -Controlled - A1c 7.9%, followed by endocrinology -Current  medications: . Lantus - Inject 8 units daily  -Medications previously tried: Humalog d/c in hospital 05/30/20 -Current home glucose readings - did not have log today -Log last reviewed 06/24/20 and fasting BG within goal, no hypoglycemia  -Denies hypoglycemic/hyperglycemic symptoms -Recommended to continue current medication; Check feet daily and complete yearly eye exams  Caregiver concerns - discussed hair loss. Caregiver asked if okay to start Biotin. Agree okay to start. Counseled that onset of improvement may take several months. Added to med list. No drug interactions. No history of thyroid disease. Hold 2 days prior to lab if evaluating thyroid function.  Patient Goals/Self-Care Activities . Over the next 90 days, patient will:  - take medications as prescribed check glucose daily, document, and provide at future appointments check blood pressure weekly, document, and provide at future appointments  Follow Up Plan: The care management team will reach out to the patient again over the next 90 days for BG and BP review      The patient verbalized understanding of instructions, educational materials, and care plan provided today and declined offer to receive copy of patient instructions, educational materials, and care plan.  The pharmacy team will reach out to the patient again over the next 90 days.   [name], PharmD Clinical Pharmacist [facility] at [facility] [phone]

## 2020-08-19 ENCOUNTER — Ambulatory Visit (INDEPENDENT_AMBULATORY_CARE_PROVIDER_SITE_OTHER): Payer: Medicare Other | Admitting: Family Medicine

## 2020-08-19 ENCOUNTER — Encounter: Payer: Self-pay | Admitting: Family Medicine

## 2020-08-19 ENCOUNTER — Other Ambulatory Visit: Payer: Self-pay

## 2020-08-19 VITALS — BP 164/64 | HR 62 | Temp 98.2°F | Ht 60.0 in | Wt 106.2 lb

## 2020-08-19 DIAGNOSIS — N1831 Chronic kidney disease, stage 3a: Secondary | ICD-10-CM | POA: Diagnosis not present

## 2020-08-19 DIAGNOSIS — I739 Peripheral vascular disease, unspecified: Secondary | ICD-10-CM | POA: Diagnosis not present

## 2020-08-19 DIAGNOSIS — I639 Cerebral infarction, unspecified: Secondary | ICD-10-CM | POA: Diagnosis not present

## 2020-08-19 DIAGNOSIS — I10 Essential (primary) hypertension: Secondary | ICD-10-CM

## 2020-08-19 DIAGNOSIS — L89620 Pressure ulcer of left heel, unstageable: Secondary | ICD-10-CM

## 2020-08-19 DIAGNOSIS — K922 Gastrointestinal hemorrhage, unspecified: Secondary | ICD-10-CM

## 2020-08-19 MED ORDER — LOSARTAN POTASSIUM 50 MG PO TABS: 50.0000 mg | ORAL_TABLET | Freq: Every day | ORAL | 6 refills | Status: DC

## 2020-08-19 MED ORDER — PANTOPRAZOLE SODIUM 40 MG PO TBEC: 40.0000 mg | DELAYED_RELEASE_TABLET | Freq: Every day | ORAL | 3 refills | Status: DC

## 2020-08-19 NOTE — Assessment & Plan Note (Signed)
Pending final read on ABIs

## 2020-08-19 NOTE — Assessment & Plan Note (Signed)
BP staying too high - increase losartan to 50mg  daily. Continue carvedilol 12.5mg  bid. RTC 1 wk Cr check. Update with effect.

## 2020-08-19 NOTE — Assessment & Plan Note (Signed)
Appreciate wound clinic care.

## 2020-08-19 NOTE — Patient Instructions (Addendum)
Increase losartan to 50mg  daily - may double up on current dose at home til you run out.  You are doing well today. Continue other medicines.  Return in 2-3 months for follow up visit.  Schedule lab visit 7-10 days after starting higher dose.

## 2020-08-19 NOTE — Progress Notes (Signed)
Patient ID: Taylor Castaneda, female    DOB: 1933-01-27, 87 y.o.   MRN: [id number]  This visit was conducted in person.  BP (!) 164/64   Pulse 62   Temp 98.2 F (36.8 C) (Temporal)   Ht 5' (1.524 m)   Wt 106 lb 3 oz (48.2 kg)   SpO2 98%   BMI 20.74 kg/m   BP Readings from Last 3 Encounters:  08/19/20 (!) 164/64  08/15/20 (!) 177/58  08/12/20 124/68    CC: HTN f/u visit  Subjective:   HPI: Taylor Castaneda is a 87 y.o. female presenting on 08/19/2020 for Hypertension (Here for 6 mo f/u.  Pt accompanied by daughter, [name]- temp 98.4.)   Recent R MCM ACA watershed infarct continues DAPT with plans to stop aspirin on 09/02/2020. Plan was to consider outpatient PT at [facility] and let PCP or PM&R know to order - they have decided to defer for now. HH has signed off.   HTN - Compliant with current antihypertensive regimen of carvedilol 12.5mg  bid, losartan 25mg  daily. Does check blood pressures at home and brings log: 130-170s/70-80s, HR 60s. No low blood pressure readings or symptoms of dizziness/syncope. Denies HA, vision changes, CP/tightness, SOB, leg swelling.    Established with wound clinic, appreciate their care. Plan Santyl as primary dressing, referred to [facility] for arterial circulation evaluation. Seen earlier in the week, ABIs pending.     Relevant past medical, surgical, family and social history reviewed and updated as indicated. Interim medical history since our last visit reviewed. Allergies and medications reviewed and updated. Outpatient Medications Prior to Visit  Medication Sig Dispense Refill  . acetaminophen (TYLENOL) 325 MG tablet Take 325-650 mg by mouth every 6 (six) hours as needed (for pain).    Marland Kitchen aspirin EC 325 MG EC tablet Take 1 tablet (325 mg total) by mouth daily. 30 tablet 0  . atorvastatin (LIPITOR) 40 MG tablet Take 1 tablet (40 mg total) by mouth daily at 6 PM. 90 tablet 3  . Biotin 5000 MCG TABS Take by mouth.    . Carboxymethylcellul-Glycerin 1-0.25 % SOLN  Place 1-2 drops into both eyes 3 (three) times daily as needed (for dry/irritated eyes.).    Marland Kitchen carvedilol (COREG) 12.5 MG tablet Take 1 tablet (12.5 mg total) by mouth 2 (two) times daily. 180 tablet 3  . Cholecalciferol (VITAMIN D3) 25 MCG (1000 UT) CAPS Take 1 capsule (1,000 Units total) by mouth daily. 30 capsule 0  . Cinnamon 500 MG capsule Take 1,000 mg by mouth daily.    . clopidogrel (PLAVIX) 75 MG tablet Take 1 tablet (75 mg total) by mouth daily. 90 tablet 4  . colchicine 0.6 MG tablet Take 1 tablet (0.6 mg total) by mouth daily as needed (gout flare). First day of gout flare, may take 1 tablet twice daily. 30 tablet 3  . collagenase (SANTYL) ointment Apply 1 application topically daily.    . CONTOUR NEXT TEST test strip 1 each 4 (four) times daily.    . diclofenac Sodium (VOLTAREN) 1 % GEL Apply 2 g topically 4 (four) times daily. 2 g 0  . Ferrous Sulfate (IRON) 325 (65 Fe) MG TABS Take 1 tablet (325 mg total) by mouth See admin instructions. Take 325 mg by mouth in the evening on Mondays and Thursdays only 30 tablet 0  . insulin glargine (LANTUS) 100 UNIT/ML Solostar Pen Inject 8 Units into the skin daily. 15 mL 11  . levETIRAcetam (KEPPRA) 750 MG  tablet Take 1 tablet (750 mg total) by mouth 2 (two) times daily. 180 tablet 4  . Multiple Vitamins-Minerals (PRESERVISION AREDS 2 PO) Take 1 capsule by mouth 2 (two) times daily.    Marland Kitchen nystatin cream (MYCOSTATIN) Apply 1 application topically 2 (two) times daily. 80 g 0  . losartan (COZAAR) 25 MG tablet Take 1 tablet (25 mg total) by mouth daily. 30 tablet 6  . pantoprazole (PROTONIX) 40 MG tablet Take 1 tablet (40 mg total) by mouth 2 (two) times daily. 60 tablet 1   No facility-administered medications prior to visit.     Per HPI unless specifically indicated in ROS section below Review of Systems Objective:  BP (!) 164/64   Pulse 62   Temp 98.2 F (36.8 C) (Temporal)   Ht 5' (1.524 m)   Wt 106 lb 3 oz (48.2 kg)   SpO2 98%   BMI  20.74 kg/m   Wt Readings from Last 3 Encounters:  08/19/20 106 lb 3 oz (48.2 kg)  08/15/20 106 lb 12.8 oz (48.4 kg)  08/12/20 106 lb (48.1 kg)      Physical Exam Vitals and nursing note reviewed.  Constitutional:      Appearance: Normal appearance. She is not ill-appearing.     Comments: Ambulates with walker  Cardiovascular:     Rate and Rhythm: Normal rate and regular rhythm.     Pulses: Normal pulses.     Heart sounds: Normal heart sounds. No murmur heard.   Pulmonary:     Effort: Pulmonary effort is normal. No respiratory distress.     Breath sounds: Normal breath sounds. No wheezing, rhonchi or rales.  Musculoskeletal:     Right lower leg: Edema (tr) present.     Left lower leg: Edema (tr) present.  Skin:    General: Skin is warm and dry.     Findings: No rash.  Neurological:     Mental Status: She is alert.     Comments: Limited responses to questioning  Psychiatric:        Mood and Affect: Mood normal.        Behavior: Behavior normal.       Results for orders placed or performed in visit on 07/29/20  CBC with Differential/Platelet  Result Value Ref Range   WBC 7.8 4.0 - 10.5 K/uL   RBC 3.82 (L) 3.87 - 5.11 Mil/uL   Hemoglobin 11.3 (L) 12.0 - 15.0 g/dL   HCT 34.6 (L) 36.0 - 46.0 %   MCV 90.6 78.0 - 100.0 fl   MCHC 32.7 30.0 - 36.0 g/dL   RDW 17.7 (H) 11.5 - 15.5 %   Platelets 190.0 150.0 - 400.0 K/uL   Neutrophils Relative % 68.3 43.0 - 77.0 %   Lymphocytes Relative 20.0 12.0 - 46.0 %   Monocytes Relative 8.1 3.0 - 12.0 %   Eosinophils Relative 2.4 0.0 - 5.0 %   Basophils Relative 1.2 0.0 - 3.0 %   Neutro Abs 5.3 1.4 - 7.7 K/uL   Lymphs Abs 1.6 0.7 - 4.0 K/uL   Monocytes Absolute 0.6 0.1 - 1.0 K/uL   Eosinophils Absolute 0.2 0.0 - 0.7 K/uL   Basophils Absolute 0.1 0.0 - 0.1 K/uL  Renal function panel  Result Value Ref Range   Sodium 139 135 - 145 mEq/L   Potassium 5.0 3.5 - 5.1 mEq/L   Chloride 103 96 - 112 mEq/L   CO2 27 19 - 32 mEq/L   Albumin  3.5 3.5 -  5.2 g/dL   BUN 24 (H) 6 - 23 mg/dL   Creatinine, Ser 1.17 0.40 - 1.20 mg/dL   Glucose, Bld 152 (H) 70 - 99 mg/dL   Phosphorus 3.7 2.3 - 4.6 mg/dL   GFR 42.38 (L) >60.00 mL/min   Calcium 9.7 8.4 - 10.5 mg/dL   Assessment & Plan:  This visit occurred during the SARS-CoV-2 public health emergency.  Safety protocols were in place, including screening questions prior to the visit, additional usage of staff PPE, and extensive cleaning of exam room while observing appropriate contact time as indicated for disinfecting solutions.   Problem List Items Addressed This Visit    Essential hypertension - Primary    BP staying too high - increase losartan to 50mg  daily. Continue carvedilol 12.5mg  bid. RTC 1 wk Cr check. Update with effect.      Relevant Medications   losartan (COZAAR) 50 MG tablet   Other Relevant Orders   Basic metabolic panel   CKD (chronic kidney disease) stage 3, GFR 30-59 ml/min (HCC)    Update Cr 1 wk after increasing losartan to 50mg  daily.       PAD (peripheral artery disease) ([place])    Pending final read on ABIs       Relevant Medications   losartan (COZAAR) 50 MG tablet   Subcortical infarction ([place])    Recent, continues DAPT for another 2 wks then transition to only plavix. Continue statin as well.       Relevant Medications   losartan (COZAAR) 50 MG tablet   Pressure injury of left heel, unstageable ([place])    Appreciate wound clinic care.       GI bleed    During recent hospitalization, was unable to complete EGD for evaluation given other acute issues at the time (including stroke). PPI was started preventatively during hospital.  Discussed PPI use - has been taking BID. rec drop dose to once daily, monitor while on plavix, consider full taper in the future.           Meds ordered this encounter  Medications  . losartan (COZAAR) 50 MG tablet    Sig: Take 1 tablet (50 mg total) by mouth daily.    Dispense:  30 tablet    Refill:  6    Note  new dose  . pantoprazole (PROTONIX) 40 MG tablet    Sig: Take 1 tablet (40 mg total) by mouth daily.    Dispense:  30 tablet    Refill:  3    Note sig change   Orders Placed This Encounter  Procedures  . Basic metabolic panel    Standing Status:   Future    Standing Expiration Date:   08/19/2021    Patient Instructions  Increase losartan to 50mg  daily - may double up on current dose at home til you run out.  You are doing well today. Continue other medicines.  Return in 2-3 months for follow up visit.  Schedule lab visit 7-10 days after starting higher dose.    Follow up plan: Return for follow up visit.  [name], MD

## 2020-08-19 NOTE — Assessment & Plan Note (Signed)
During recent hospitalization, was unable to complete EGD for evaluation given other acute issues at the time (including stroke). PPI was started preventatively during hospital.  Discussed PPI use - has been taking BID. rec drop dose to once daily, monitor while on plavix, consider full taper in the future.

## 2020-08-19 NOTE — Assessment & Plan Note (Signed)
Recent, continues DAPT for another 2 wks then transition to only plavix. Continue statin as well.

## 2020-08-19 NOTE — Assessment & Plan Note (Signed)
Update Cr 1 wk after increasing losartan to 50mg  daily.

## 2020-08-21 ENCOUNTER — Encounter (INDEPENDENT_AMBULATORY_CARE_PROVIDER_SITE_OTHER): Payer: Self-pay | Admitting: Nurse Practitioner

## 2020-08-21 NOTE — Progress Notes (Signed)
Subjective:    Patient ID: Taylor Castaneda, female    DOB: 1933-04-21, 87 y.o.   MRN: [id number] Chief Complaint  Patient presents with  . New Patient (Initial Visit)    Ref Dellia Nims non-healing pressure wound left heel    Taylor Castaneda is an 87 year old female that presents today for wound evaluation by Dr. Dellia Nims.  The patient has a nonhealing left heel pressure ulcer.  The patient recently had an extended hospitalization and developed a pressure ulcer on her left heel.  The patient was prescribed Santyl but per the son, there was delay in receiving the wound care supplies and they just started this week.  The patient notes that the area is somewhat tender.  She denies any fever or chills.  Today the bilateral ABIs are noncompressible.  Previous studies in 2018 showed a right ABI of 1.18 with a left of 1.13.  The patient has a TBI 0.38 on the right and 0.36 on the left.  Previous studies in 2018 show a TBI 0.93 on the right and 0.61 on the left.  The patient has monophasic tibial artery waveforms with strong clinical waveforms bilaterally.   Review of Systems  Skin: Positive for wound.  All other systems reviewed and are negative.      Objective:   Physical Exam Vitals reviewed.  HENT:     Head: Normocephalic.  Cardiovascular:     Rate and Rhythm: Normal rate.     Pulses:          Dorsalis pedis pulses are detected w/ Doppler on the right side and detected w/ Doppler on the left side.       Posterior tibial pulses are detected w/ Doppler on the right side and detected w/ Doppler on the left side.  Pulmonary:     Effort: Pulmonary effort is normal.  Skin:    General: Skin is warm and dry.     Capillary Refill: Capillary refill takes 2 to 3 seconds.  Neurological:     Mental Status: She is alert and oriented to person, place, and time. Mental status is at baseline.  Psychiatric:        Mood and Affect: Mood normal.        Behavior: Behavior normal.        Thought Content:  Thought content normal.        Judgment: Judgment normal.     BP (!) 177/58 (BP Location: Right Arm)   Pulse 76   Resp 16   Wt 106 lb 12.8 oz (48.4 kg)   BMI 20.86 kg/m   Past Medical History:  Diagnosis Date  . Anemia   . Cerebral infarction ([place]) 05/2020  . Diabetes mellitus, type II ([place])   . Glaucoma    Dr.Hecker  . History of colon cancer 1998   Dr. Lennie Hummer  . Hyperlipemia   . Osteopenia 02/2012, 03/2014   DEXA hip -2.2  . Renal insufficiency   . Seizures ([place])   . Stenosing tenosynovitis of finger of left hand 2016   index - s/p steroid injection x2  . Stroke [facility])     Social History   Socioeconomic History  . Marital status: Widowed    Spouse name: Not on file  . Number of children: 4  . Years of education: Not on file  . Highest education level: High school graduate  Occupational History  . Occupation: retired    Fish farm manager: Retired    Comment: Regulatory affairs officer  Tobacco Use  .  Smoking status: Former Smoker    Years: 2.00    Types: Cigarettes    Quit date: 06/04/1973    Years since quitting: 47.2  . Smokeless tobacco: Never Used  . Tobacco comment: Smoked for 5 years 1965-1970 up to 1/2 pp week  Vaping Use  . Vaping Use: Never used  Substance and Sexual Activity  . Alcohol use: Not Currently  . Drug use: No  . Sexual activity: Never  Other Topics Concern  . Not on file  Social History Narrative   07/25/20  Lives alone, no pets, has family with her 24/7   Neighbors keep an eye on each other - 12 unit condo.    Family nearby.   Occ: retired Regulatory affairs officer   Activity: walking   Diet: good water, fruits/vegetables daily   Caffeine- some tea, occas soda   Social Determinants of Health   Financial Resource Strain: Low Risk   . Difficulty of Paying Living Expenses: Not very hard  Food Insecurity: Not on file  Transportation Needs: Not on file  Physical Activity: Not on file  Stress: Not on file  Social Connections: Not on file  Intimate Partner Violence:  Not on file    Past Surgical History:  Procedure Laterality Date  . ABI  11/2013   WNL  . APPENDECTOMY    . BILIARY DILATION  05/31/2020   Procedure: BILIARY DILATION;  Surgeon: Jackquline Denmark, MD;  Location: [facility] ENDOSCOPY;  Service: Endoscopy;;  . BIOPSY  05/31/2020   Procedure: BIOPSY;  Surgeon: Jackquline Denmark, MD;  Location: [place];  Service: Endoscopy;;  . CARPAL TUNNEL RELEASE     Bilateral   . CATARACT EXTRACTION Bilateral 2006   Implants  . CHOLECYSTECTOMY N/A 06/01/2020   Procedure: LAPAROSCOPIC CHOLECYSTECTOMY;  Surgeon: Clovis Riley, MD;  Location: [place];  Service: General;  Laterality: N/A;  . COLECTOMY  1998   For cancer  . COLONOSCOPY  2008   Dr.Stark, [place] 2011  . COLONOSCOPY  06/2013   1 hyperplastic polyp, ileocecal anastomosis [facility])  . dexa  02/2012, 03/2014   T score -2.2 at hip overall stable  . ENDOSCOPIC RETROGRADE CHOLANGIOPANCREATOGRAPHY (ERCP) WITH PROPOFOL N/A 05/31/2020   Procedure: ENDOSCOPIC RETROGRADE CHOLANGIOPANCREATOGRAPHY (ERCP) WITH PROPOFOL;  Surgeon: Jackquline Denmark, MD;  Location[facility] ENDOSCOPY;  Service: Endoscopy;  Laterality: N/A;  . REFRACTIVE SURGERY    . REMOVAL OF STONES  05/31/2020   Procedure: REMOVAL OF STONES;  Surgeon: Jackquline Denmark, MD;  Location: [facility] ENDOSCOPY;  Service: Endoscopy;;  . [name]  05/31/2020   Procedure: [name];  Surgeon: Jackquline Denmark, MD;  Location: [facility] ENDOSCOPY;  Service: Endoscopy;;  . TONSILLECTOMY AND ADENOIDECTOMY    . TOTAL KNEE ARTHROPLASTY Left 12/17/2016   Procedure: LEFT TOTAL KNEE ARTHROPLASTY;  Surgeon: Gaynelle Arabian, MD;  Location: [facility] ORS;  Service: Orthopedics;  Laterality: Left;    Family History  Problem Relation Age of Onset  . Heart attack Father 11  . Stroke Father 54  . Diabetes Father   . Hypertension Father   . Heart disease Father        before age 57  . Breast cancer Mother   . Cancer Mother   . Diabetes Sister   . Cancer Sister   . Peripheral vascular disease  Sister   . Breast cancer Sister   . Hypertension Son   . COPD Neg Hx   . Asthma Neg Hx   . Colon cancer Neg Hx   . Esophageal cancer Neg Hx   .  Rectal cancer Neg Hx   . Stomach cancer Neg Hx     Allergies  Allergen Reactions  . Gabapentin Other (See Comments)    Dizziness  . Pravastatin Sodium Other (See Comments)    "Anemia," per Dr Gilford Rile, [place]    CBC Latest Ref Rng & Units 07/29/2020 07/12/2020 06/19/2020  WBC 4.0 - 10.5 K/uL 7.8 11.3(H) 8.8  Hemoglobin 12.0 - 15.0 g/dL 11.3(L) 10.2(L) 8.4(L)  Hematocrit 36.0 - 46.0 % 34.6(L) 31.9(L) 25.3(L)  Platelets 150.0 - 400.0 K/uL 190.0 203.0 213      CMP     Component Value Date/Time   NA 139 07/29/2020 1039   K 5.0 07/29/2020 1039   CL 103 07/29/2020 1039   CO2 27 07/29/2020 1039   GLUCOSE 152 (H) 07/29/2020 1039   BUN 24 (H) 07/29/2020 1039   CREATININE 1.17 07/29/2020 1039   CALCIUM 9.7 07/29/2020 1039   PROT 5.3 (L) 06/13/2020 0537   ALBUMIN 3.5 07/29/2020 1039   AST 21 06/13/2020 0537   ALT 22 06/13/2020 0537   ALKPHOS 156 (H) 06/13/2020 0537   BILITOT 0.7 06/13/2020 0537   GFRNONAA 31 (L) 06/24/2020 0534   GFRAA 46 (L) 03/06/2019 0312     No results found.     Assessment & Plan:   1. Pressure injury of left heel, unstageable ([place]) Per the family they have recently started with wound care and began to use Santyl on the wound.  They have not been doing wound care for an extensive amount of time to determine if the wound will heal without intervention.  2. Essential hypertension Continue antihypertensive medications as already ordered, these medications have been reviewed and there are no changes at this time.   3. PAD (peripheral artery disease) ([place]) I had a long discussion with the patient and her son in regards to the patient's wound.  The patient's left lower extremity has diminished waveforms in the posterior tibial artery which typically provides perfusion to the heel area.  I discussed 2  options.  The first will use a watchful waiting approach, to see if the wound heals without any vascular intervention.  The risk associated with this is if the wound begins to deteriorate, vascular intervention may be too late and the patient may ultimately wind up with limb amputation.  If the patient chooses this option we will have him back in 1 month to evaluate the size of the wound.  The second option is a more aggressive approach which includes left lower extremity angiogram.  This will allow intervention to the posterior tibial as well as any other areas found with high-grade stenosis.  This would allow greater perfusion to the area which would increase the likelihood of wound healing.  We discussed the procedure itself including the risk, benefits and alternatives.  At this time the patient wishes to discuss with family and they will contact our office once they have made a decision.   Current Outpatient Medications on File Prior to Visit  Medication Sig Dispense Refill  . acetaminophen (TYLENOL) 325 MG tablet Take 325-650 mg by mouth every 6 (six) hours as needed (for pain).    Marland Kitchen aspirin EC 325 MG EC tablet Take 1 tablet (325 mg total) by mouth daily. 30 tablet 0  . atorvastatin (LIPITOR) 40 MG tablet Take 1 tablet (40 mg total) by mouth daily at 6 PM. 90 tablet 3  . Carboxymethylcellul-Glycerin 1-0.25 % SOLN Place 1-2 drops into both eyes 3 (three) times daily as  needed (for dry/irritated eyes.).    Marland Kitchen carvedilol (COREG) 12.5 MG tablet Take 1 tablet (12.5 mg total) by mouth 2 (two) times daily. 180 tablet 3  . Cholecalciferol (VITAMIN D3) 25 MCG (1000 UT) CAPS Take 1 capsule (1,000 Units total) by mouth daily. 30 capsule 0  . Cinnamon 500 MG capsule Take 1,000 mg by mouth daily.    . clopidogrel (PLAVIX) 75 MG tablet Take 1 tablet (75 mg total) by mouth daily. 90 tablet 4  . colchicine 0.6 MG tablet Take 1 tablet (0.6 mg total) by mouth daily as needed (gout flare). First day of gout  flare, may take 1 tablet twice daily. 30 tablet 3  . collagenase (SANTYL) ointment Apply 1 application topically daily.    . CONTOUR NEXT TEST test strip 1 each 4 (four) times daily.    . diclofenac Sodium (VOLTAREN) 1 % GEL Apply 2 g topically 4 (four) times daily. 2 g 0  . Ferrous Sulfate (IRON) 325 (65 Fe) MG TABS Take 1 tablet (325 mg total) by mouth See admin instructions. Take 325 mg by mouth in the evening on Mondays and Thursdays only 30 tablet 0  . insulin glargine (LANTUS) 100 UNIT/ML Solostar Pen Inject 8 Units into the skin daily. 15 mL 11  . levETIRAcetam (KEPPRA) 750 MG tablet Take 1 tablet (750 mg total) by mouth 2 (two) times daily. 180 tablet 4  . Multiple Vitamins-Minerals (PRESERVISION AREDS 2 PO) Take 1 capsule by mouth 2 (two) times daily.    Marland Kitchen nystatin cream (MYCOSTATIN) Apply 1 application topically 2 (two) times daily. 80 g 0  . Biotin 5000 MCG TABS Take by mouth.     No current facility-administered medications on file prior to visit.    There are no Patient Instructions on file for this visit. No follow-ups on file.   Kris Hartmann, NP

## 2020-08-24 ENCOUNTER — Other Ambulatory Visit: Payer: Self-pay

## 2020-08-24 ENCOUNTER — Encounter: Payer: Medicare Other | Admitting: Internal Medicine

## 2020-08-24 DIAGNOSIS — I129 Hypertensive chronic kidney disease with stage 1 through stage 4 chronic kidney disease, or unspecified chronic kidney disease: Secondary | ICD-10-CM | POA: Diagnosis not present

## 2020-08-24 DIAGNOSIS — L89620 Pressure ulcer of left heel, unstageable: Secondary | ICD-10-CM | POA: Diagnosis not present

## 2020-08-24 DIAGNOSIS — E1122 Type 2 diabetes mellitus with diabetic chronic kidney disease: Secondary | ICD-10-CM | POA: Diagnosis not present

## 2020-08-24 DIAGNOSIS — E11621 Type 2 diabetes mellitus with foot ulcer: Secondary | ICD-10-CM | POA: Diagnosis not present

## 2020-08-24 DIAGNOSIS — E1151 Type 2 diabetes mellitus with diabetic peripheral angiopathy without gangrene: Secondary | ICD-10-CM | POA: Diagnosis not present

## 2020-08-24 DIAGNOSIS — N1832 Chronic kidney disease, stage 3b: Secondary | ICD-10-CM | POA: Diagnosis not present

## 2020-08-24 DIAGNOSIS — G40909 Epilepsy, unspecified, not intractable, without status epilepticus: Secondary | ICD-10-CM | POA: Diagnosis not present

## 2020-08-24 DIAGNOSIS — Z8673 Personal history of transient ischemic attack (TIA), and cerebral infarction without residual deficits: Secondary | ICD-10-CM | POA: Diagnosis not present

## 2020-08-25 NOTE — Progress Notes (Signed)
CHANNING, SAVICH ([id number]) Visit Report for 08/24/2020 Debridement Details Patient Name: Taylor Castaneda, Taylor Castaneda. Date of Service: 08/24/2020 10:45 AM Medical Record Number: [id number] Patient Account Number: [id number] Date of Birth/Sex: 1932/11/15 (88 y.o. F) Treating RN: Cornell Barman Primary Care Provider: [name] Other Clinician: Jeanine Luz Referring Provider: [name] Treating Provider/Extender: Tito Dine in Treatment: 3 Debridement Performed for Wound #1 Left Calcaneus Assessment: Performed By: Physician Ricard Dillon, MD Debridement Type: Debridement Level of Consciousness (Pre- Awake and Alert procedure): Pre-procedure Verification/Time Out Yes - 11:19 Taken: Total Area Debrided (L x W): 2.4 (cm) x 1.8 (cm) = 4.32 (cm) Tissue and other material Viable, Non-Viable, Slough, Subcutaneous, Slough debrided: Level: Skin/Subcutaneous Tissue Debridement Description: Excisional Instrument: Curette Bleeding: Moderate Hemostasis Achieved: Pressure Response to Treatment: Procedure was tolerated well Level of Consciousness (Post- Awake and Alert procedure): Post Debridement Measurements of Total Wound Length: (cm) 2.4 Stage: Unstageable/Unclassified Width: (cm) 1.8 Depth: (cm) 0.3 Volume: (cm) 1.018 Character of Wound/Ulcer Post Debridement: Stable Post Procedure Diagnosis Same as Pre-procedure Electronic Signature(s) Signed: 08/24/2020 5:15:48 PM By: Linton Ham MD Signed: 08/24/2020 6:23:16 PM By: Gretta Cool, BSN, RN, CWS, Kim RN, BSN Entered By: Linton Ham on 08/24/2020 12:44:28 Taylor Castaneda ([id number]) -------------------------------------------------------------------------------- HPI Details Patient Name: Taylor Castaneda Date of Service: 08/24/2020 10:45 AM Medical Record Number: [id number] Patient Account Number: [id number] Date of Birth/Sex: 07/05/1932 (88 y.o. F) Treating RN: Cornell Barman Primary Care Provider: [name] Other Clinician: Jeanine Luz Referring Provider: [name] Treating Provider/Extender: Tito Dine in Treatment: 3 History of Present Illness HPI Description: ADMISSION 08/03/2020 This is a 87 year old woman who was in the hospital last month suffering a right CVA with mild residual left-sided symptoms. Sometime during this hospitalization or shortly after she got home they noted that she had an ulcer on her left heel. Saw her primary doctor on 2/25 who correctly pointed out this was unstageable and referred her here. Patient does not complain of any pain. She has home health [[facility]] but we are not exactly sure what they are currently using for wound dressing. Past medical history includes hypertension, type 2 diabetes, stage IIIb chronic renal failure, recent right CVA is noted, laparoscopic cholecystectomy also last month, gout, seizures ABI in our clinic was 0.71 on the left 08/10/2020 upon evaluation today patient appears to be doing well with regard to her heel ulcer in my opinion. Fortunately there does not appear to be any signs of active infection at this time. No fever chills noted She does have an appointment or rather referral to get an appointment with vascular hopefully we can get that scheduled soon they called and left a message that has not been set up yet though were given the information for them to call the care of that today. 3/23; using Santyl on the pressure ulcer on her left heel. She has had a thorough vascular evaluation. Noncompressible bilaterally however also with monophasic waveforms and low TBI's with a TBI in the right of 0.38 and on the left 0.36. She was seen by vascular. They felt this was a pressure ulcer I think most everybody agrees with this. They also had a long discussion with the patient's son with regards to her PAD. She has diminished waveforms in the posterior tibial artery which is the vessel that perfuses her heel.  Nevertheless I think the agreement was a period of watchful waiting to see if Santyl and aggressive wound care can heal this area. The more aggressive  approach of course is an angiogram. I talked to the patient about this today with her son they are indeed in favor of a period of watchful waiting to see if wound care can get this area to close. She does not describe claudication with her minimal activity or at rest Electronic Signature(s) Signed: 08/24/2020 5:15:48 PM By: Linton Ham MD Entered By: Linton Ham on 08/24/2020 12:47:44 Taylor Castaneda ([id number]) -------------------------------------------------------------------------------- Physical Exam Details Patient Name: Taylor Castaneda Date of Service: 08/24/2020 10:45 AM Medical Record Number: [id number] Patient Account Number: [id number] Date of Birth/Sex: 1932-07-30 (88 y.o. F) Treating RN: Cornell Barman Primary Care Provider: [name] Other Clinician: Jeanine Luz Referring Provider: [name] Treating Provider/Extender: Tito Dine in Treatment: 3 Constitutional Patient is hypertensive.. Cardiovascular Pedal pulses are not palpable. Notes Wound examination I used a #3 curette to aggressively debride this today. The necrotic surface came off much easier now that she has had a period of time with Santyl. Fortunately there was reasonably brisk bleeding from this area. Electronic Signature(s) Signed: 08/24/2020 5:15:48 PM By: Linton Ham MD Entered By: Linton Ham on 08/24/2020 12:48:42 Taylor Castaneda ([id number]) -------------------------------------------------------------------------------- Physician Orders Details Patient Name: Taylor Castaneda, Taylor Castaneda Date of Service: 08/24/2020 10:45 AM Medical Record Number: [id number] Patient Account Number: [id number] Date of Birth/Sex: 08-25-1932 (88 y.o. F) Treating RN: Cornell Barman Primary Care Provider: [name] Other Clinician:  Jeanine Luz Referring Provider: [name] Treating Provider/Extender: Tito Dine in Treatment: 3 Verbal / Phone Orders: No Diagnosis Coding Follow-up Appointments Wound #1 Left Calcaneus o Return Appointment in 1 week. Home Health Wound #1 Left [place]: - [facility]-established patient o [place] for wound care. May utilize formulary equivalent dressing for wound treatment orders unless otherwise specified. Home Health Nurse may visit PRN to address patientos wound care needs. o Scheduled days for dressing changes to be completed; exception, patient has scheduled wound care visit that day. o **Please direct any NON-WOUND related issues/requests for orders to patient's Primary Care Physician. **If current dressing causes regression in wound condition, may D/C ordered dressing product/s and apply Normal Saline Moist Dressing daily until next [place] or Other MD appointment. **Notify W[facility] Healing Center of regression in wound condition at [phone]. Bathing/ Shower/ Hygiene o May shower; gently cleanse wound with antibacterial soap, rinse and pat dry prior to dressing wounds Off-Loading o Other: - Float heels to keep pressure off of them. Additional Orders / Instructions o Follow Nutritious Diet and Increase Protein Intake Medications-Please add to medication list. o Santyl Enzymatic Ointment - If Santyl is not affordable, use Medihoney or Manuka Honey over the counter. Wound Treatment Wound #1 - Calcaneus Wound Laterality: Left Topical: Santyl Collagenase Ointment, 30 (gm), tube (Home Health) 1 x Per Day/30 Days Discharge Instructions: apply nickel thick to wound bed only Primary Dressing: Gauze ([place]) 1 x Per Day/30 Days Discharge Instructions: As directed: dry, moistened with saline or moistened with Dakins Solution Secondary Dressing: Kerlix 4.5 x 4.1 (in/yd) (Home Health) 1 x Per  Day/30 Days Discharge Instructions: Wrapped lightly due to blood flow. Secured With: 81M Medipore H Soft Cloth Surgical Tape, 2x2 (in/yd) (Home Health) 1 x Per Day/30 Days Electronic Signature(s) Signed: 08/24/2020 5:15:48 PM By: Linton Ham MD Signed: 08/24/2020 6:23:16 PM By: Gretta Cool, BSN, RN, CWS, Kim RN, BSN Entered By: Gretta Cool, BSN, RN, CWS, Kim on 08/24/2020 11:21:33 Taylor Castaneda ([id number]) -------------------------------------------------------------------------------- Problem List Details Patient Name: Taylor Castaneda, Taylor Castaneda Date  of Service: 08/24/2020 10:45 AM Medical Record Number: [id number] Patient Account Number: [id number] Date of Birth/Sex: 1933-03-02 (88 y.o. F) Treating RN: Cornell Barman Primary Care Provider: [name] Other Clinician: Jeanine Luz Referring Provider: [name] Treating Provider/Extender: Tito Dine in Treatment: 3 Active Problems ICD-10 Encounter Code Description Active Date MDM Diagnosis E11.621 Type 2 diabetes mellitus with foot ulcer 08/03/2020 No Yes L89.620 Pressure ulcer of left heel, unstageable 08/03/2020 No Yes E11.51 Type 2 diabetes mellitus with diabetic peripheral angiopathy without 08/03/2020 No Yes gangrene Inactive Problems Resolved Problems Electronic Signature(s) Signed: 08/24/2020 5:15:48 PM By: Linton Ham MD Entered By: Linton Ham on 08/24/2020 12:44:09 Taylor Castaneda ([id number]) -------------------------------------------------------------------------------- Progress Note Details Patient Name: Taylor Castaneda Date of Service: 08/24/2020 10:45 AM Medical Record Number: [id number] Patient Account Number: [id number] Date of Birth/Sex: 07/30/1932 (88 y.o. F) Treating RN: Cornell Barman Primary Care Provider: [name] Other Clinician: Jeanine Luz Referring Provider: [name] Treating Provider/Extender: Tito Dine in Treatment: 3 Subjective History of Present  Illness (HPI) ADMISSION 08/03/2020 This is a 87 year old woman who was in the hospital last month suffering a right CVA with mild residual left-sided symptoms. Sometime during this hospitalization or shortly after she got home they noted that she had an ulcer on her left heel. Saw her primary doctor on 2/25 who correctly pointed out this was unstageable and referred her here. Patient does not complain of any pain. She has home health [[facility]] but we are not exactly sure what they are currently using for wound dressing. Past medical history includes hypertension, type 2 diabetes, stage IIIb chronic renal failure, recent right CVA is noted, laparoscopic cholecystectomy also last month, gout, seizures ABI in our clinic was 0.71 on the left 08/10/2020 upon evaluation today patient appears to be doing well with regard to her heel ulcer in my opinion. Fortunately there does not appear to be any signs of active infection at this time. No fever chills noted She does have an appointment or rather referral to get an appointment with vascular hopefully we can get that scheduled soon they called and left a message that has not been set up yet though were given the information for them to call the care of that today. 3/23; using Santyl on the pressure ulcer on her left heel. She has had a thorough vascular evaluation. Noncompressible bilaterally however also with monophasic waveforms and low TBI's with a TBI in the right of 0.38 and on the left 0.36. She was seen by vascular. They felt this was a pressure ulcer I think most everybody agrees with this. They also had a long discussion with the patient's son with regards to her PAD. She has diminished waveforms in the posterior tibial artery which is the vessel that perfuses her heel. Nevertheless I think the agreement was a period of watchful waiting to see if Santyl and aggressive wound care can heal this area. The more aggressive approach of course is an  angiogram. I talked to the patient about this today with her son they are indeed in favor of a period of watchful waiting to see if wound care can get this area to close. She does not describe claudication with her minimal activity or at rest Objective Constitutional Patient is hypertensive.. Vitals Time Taken: 11:05 AM, Temperature: 97.8 F, Pulse: 69 bpm, Respiratory Rate: 18 breaths/min, Blood Pressure: 179/81 mmHg. Cardiovascular Pedal pulses are not palpable. General Notes: Wound examination I used a #3 curette to aggressively debride this today.  The necrotic surface came off much easier now that she has had a period of time with Santyl. Fortunately there was reasonably brisk bleeding from this area. Integumentary (Hair, Skin) Wound #1 status is Open. Original cause of wound was Gradually Appeared. The date acquired was: 06/22/2020. The wound has been in treatment 3 weeks. The wound is located on the Left Calcaneus. The wound measures 2.4cm length x 1.8cm width x 0.1cm depth; 3.393cm^2 area and 0.339cm^3 volume. There is Fat Layer (Subcutaneous Tissue) exposed. There is no tunneling or undermining noted. There is a medium amount of serosanguineous drainage noted. There is small (1-33%) red, pink granulation within the wound bed. There is a large (67-100%) amount of necrotic tissue within the wound bed including Adherent Slough. Assessment Active Problems Taylor Castaneda, Taylor Castaneda ([id number]) ICD-10 Type 2 diabetes mellitus with foot ulcer Pressure ulcer of left heel, unstageable Type 2 diabetes mellitus with diabetic peripheral angiopathy without gangrene Procedures Wound #1 Pre-procedure diagnosis of Wound #1 is a Pressure Ulcer located on the Left Calcaneus . There was a Excisional Skin/Subcutaneous Tissue Debridement with a total area of 4.32 sq cm performed by Ricard Dillon, MD. With the following instrument(s): Curette to remove Viable and Non-Viable tissue/material. Material removed  includes Subcutaneous Tissue and Slough and. No specimens were taken. A time out was conducted at 11:19, prior to the start of the procedure. A Moderate amount of bleeding was controlled with Pressure. The procedure was tolerated well. Post Debridement Measurements: 2.4cm length x 1.8cm width x 0.3cm depth; 1.018cm^3 volume. Post debridement Stage noted as Unstageable/Unclassified. Character of Wound/Ulcer Post Debridement is stable. Post procedure Diagnosis Wound #1: Same as Pre-Procedure Plan Follow-up Appointments: Wound #1 Left Calcaneus: Return Appointment in 1 week. Home Health: Wound #1 Left Calcaneus: [place]: - [facility]-established patient [place] for wound care. May utilize formulary equivalent dressing for wound treatment orders unless otherwise specified. Home Health Nurse may visit PRN to address patient s wound care needs. Scheduled days for dressing changes to be completed; exception, patient has scheduled wound care visit that day. **Please direct any NON-WOUND related issues/requests for orders to patient's Primary Care Physician. **If current dressing causes regression in wound condition, may D/C ordered dressing product/s and apply Normal Saline Moist Dressing daily until next [place] or Other MD appointment. **Notify Wound Healing Center of regression in wound condition at [phone]. Bathing/ Shower/ Hygiene: May shower; gently cleanse wound with antibacterial soap, rinse and pat dry prior to dressing wounds Off-Loading: Other: - Float heels to keep pressure off of them. Additional Orders / Instructions: Follow Nutritious Diet and Increase Protein Intake Medications-Please add to medication list.: Santyl Enzymatic Ointment - If Santyl is not affordable, use Medihoney or Manuka Honey over the counter. WOUND #1: - Calcaneus Wound Laterality: Left Topical: Santyl Collagenase Ointment, 30 (gm), tube (Home Health) 1 x Per Day/30  Days Discharge Instructions: apply nickel thick to wound bed only Primary Dressing: Gauze ([place]) 1 x Per Day/30 Days Discharge Instructions: As directed: dry, moistened with saline or moistened with Dakins Solution Secondary Dressing: Kerlix 4.5 x 4.1 (in/yd) (Home Health) 1 x Per Day/30 Days Discharge Instructions: Wrapped lightly due to blood flow. Secured With: 56M Medipore H Soft Cloth Surgical Tape, 2x2 (in/yd) (Home Health) 1 x Per Day/30 Days 1. I am continuing with the Santyl to we get to a healthy wound bed. 2. First aggressive debridement here. We are not going ahead with an angiogram at least not for now  and I thought it was important to give this wound a chance to heal with aggressive wound care. I will be interested in seeing what the surface of this looks like next week if it is covered again with necrotic debris it might suggest active ischemia that I probably will not debride again. On the other hand if this looks better we will clean up the surface and look towards changing the dressing to see if we can get something to stimulate further granulation Electronic Signature(s) Signed: 08/24/2020 5:15:48 PM By: Linton Ham MD Entered By: Linton Ham on 08/24/2020 12:50:08 Taylor Castaneda ([id number]) -------------------------------------------------------------------------------- SuperBill Details Patient Name: Taylor Castaneda Date of Service: 08/24/2020 Medical Record Number: [id number] Patient Account Number: [id number] Date of Birth/Sex: 1933/05/15 (88 y.o. F) Treating RN: Cornell Barman Primary Care Provider: [name] Other Clinician: Jeanine Luz Referring Provider: [name] Treating Provider/Extender: Tito Dine in Treatment: 3 Diagnosis Coding ICD-10 Codes Code Description E11.621 Type 2 diabetes mellitus with foot ulcer L89.620 Pressure ulcer of left heel, unstageable E11.51 Type 2 diabetes mellitus with diabetic peripheral  angiopathy without gangrene Facility Procedures CPT4 Code: [id number] Description: 17616 - DEB SUBQ TISSUE 20 SQ CM/< Modifier: Quantity: 1 CPT4 Code: Description: ICD-10 Diagnosis Description L89.620 Pressure ulcer of left heel, unstageable E11.621 Type 2 diabetes mellitus with foot ulcer E11.51 Type 2 diabetes mellitus with diabetic peripheral angiopathy without g Modifier: angrene Quantity: Physician Procedures CPT4 Code: 0737106 Description: 26948 - WC PHYS SUBQ TISS 20 SQ CM Modifier: Quantity: 1 CPT4 Code: Description: ICD-10 Diagnosis Description L89.620 Pressure ulcer of left heel, unstageable E11.621 Type 2 diabetes mellitus with foot ulcer E11.51 Type 2 diabetes mellitus with diabetic peripheral angiopathy without g Modifier: angrene Quantity: Electronic Signature(s) Signed: 08/24/2020 5:15:48 PM By: Linton Ham MD Entered By: Linton Ham on 08/24/2020 12:50:30

## 2020-08-26 ENCOUNTER — Other Ambulatory Visit: Payer: Self-pay

## 2020-08-26 ENCOUNTER — Telehealth: Payer: Self-pay | Admitting: *Deleted

## 2020-08-26 ENCOUNTER — Other Ambulatory Visit (INDEPENDENT_AMBULATORY_CARE_PROVIDER_SITE_OTHER): Payer: Medicare Other

## 2020-08-26 DIAGNOSIS — I10 Essential (primary) hypertension: Secondary | ICD-10-CM | POA: Diagnosis not present

## 2020-08-26 NOTE — Telephone Encounter (Signed)
Signed and in my outbox.

## 2020-08-26 NOTE — Addendum Note (Signed)
Addended by: Cloyd Stagers on: 08/26/2020 02:55 PM   Modules accepted: Orders

## 2020-08-26 NOTE — Telephone Encounter (Signed)
Received a fax from [place] for orders to be signed and faxed back to them.The order was for wound care. Patient has seen Vascular Surgery and the [place]. Form in your in box.

## 2020-08-27 LAB — BASIC METABOLIC PANEL
BUN/Creatinine Ratio: 41 (calc) — ABNORMAL HIGH (ref 6–22)
BUN: 41 mg/dL — ABNORMAL HIGH (ref 7–25)
CO2: 18 mmol/L — ABNORMAL LOW (ref 20–32)
Calcium: 8.7 mg/dL (ref 8.6–10.4)
Chloride: 109 mmol/L (ref 98–110)
Creat: 1.01 mg/dL — ABNORMAL HIGH (ref 0.60–0.88)
Glucose, Bld: 157 mg/dL — ABNORMAL HIGH (ref 65–99)
Potassium: 4.1 mmol/L (ref 3.5–5.3)
Sodium: 140 mmol/L (ref 135–146)

## 2020-08-29 NOTE — Progress Notes (Signed)
Taylor, Castaneda ([id number]) Visit Report for 08/24/2020 Arrival Information Details Patient Name: Taylor Castaneda, Taylor Castaneda. Date of Service: 08/24/2020 10:45 AM Medical Record Number: [id number] Patient Account Number: [id number] Date of Birth/Sex: [birth year] (88 y.o. F) Treating RN: Carlene Coria Primary Care Chris Narasimhan: [name] Other Clinician: Jeanine Luz Referring Andersen Iorio: [name] Treating Shaheen Mende/Extender: Tito Dine in Treatment: 3 Visit Information History Since Last Visit All ordered tests and consults were completed: No Patient Arrived: Wheel Chair Added or deleted any medications: No Arrival Time: 10:59 Any new allergies or adverse reactions: No Accompanied By: son Had a fall or experienced change in No Transfer Assistance: None activities of daily living that may affect Patient Identification Verified: Yes risk of falls: Secondary Verification Process Completed: Yes Signs or symptoms of abuse/neglect since last visito No Patient Requires Transmission-Based No Hospitalized since last visit: No Precautions: Implantable device outside of the clinic excluding No Patient Has Alerts: Yes cellular tissue based products placed in the center Patient Alerts: Patient on Blood since last visit: Thinner Has Dressing in Place as Prescribed: Yes Type I Diabetic Pain Present Now: No Electronic Signature(s) Signed: 08/29/2020 5:08:01 PM By: Carlene Coria RN Entered By: Carlene Coria on 08/24/2020 11:04:25 Taylor Castaneda ([id number]) -------------------------------------------------------------------------------- Encounter Discharge Information Details Patient Name: Taylor Castaneda Date of Service: 08/24/2020 10:45 AM Medical Record Number: [id number] Patient Account Number: [id number] Date of Birth/Sex: May 03, 1933 (88 y.o. F) Treating RN: Dolan Amen Primary Care Leina Babe: [name] Other Clinician: Jeanine Luz Referring Braidan Ricciardi:  [name] Treating Bastien Strawser/Extender: Tito Dine in Treatment: 3 Encounter Discharge Information Items Post Procedure Vitals Discharge Condition: Stable Temperature (F): 97.8 Ambulatory Status: Wheelchair Pulse (bpm): 69 Discharge Destination: Home Respiratory Rate (breaths/min): 18 Transportation: Private Auto Blood Pressure (mmHg): 179/81 Accompanied By: son Schedule Follow-up Appointment: Yes Clinical Summary of Care: Electronic Signature(s) Signed: 08/24/2020 4:40:52 PM By: Georges Mouse, Minus Breeding RN Entered By: Georges Mouse, Minus Breeding on 08/24/2020 11:38:27 Taylor Castaneda ([id number]) -------------------------------------------------------------------------------- Lower Extremity Assessment Details Patient Name: Taylor Castaneda Date of Service: 08/24/2020 10:45 AM Medical Record Number: [id number] Patient Account Number: [id number] Date of Birth/Sex: 05/01/1933 (88 y.o. F) Treating RN: Carlene Coria Primary Care Veto Macqueen: [name] Other Clinician: Jeanine Luz Referring Attikus Bartoszek: [name] Treating Sirinity Outland/Extender: Tito Dine in Treatment: 3 Electronic Signature(s) Signed: 08/29/2020 5:08:01 PM By: Carlene Coria RN Entered By: Carlene Coria on 08/24/2020 11:11:10 Taylor Castaneda ([id number]) -------------------------------------------------------------------------------- Multi Wound Chart Details Patient Name: Taylor Castaneda Date of Service: 08/24/2020 10:45 AM Medical Record Number: [id number] Patient Account Number: [id number] Date of Birth/Sex: 01/14/1933 (88 y.o. F) Treating RN: Cornell Barman Primary Care Galan Ghee: [name] Other Clinician: Jeanine Luz Referring Georgean Spainhower: [name] Treating Marajade Lei/Extender: Tito Dine in Treatment: 3 Vital Signs Height(in): Pulse(bpm): 31 Weight(lbs): Blood Pressure(mmHg): 179/81 Body Mass Index(BMI): Temperature(F): 97.8 Respiratory  Rate(breaths/min): 18 Photos: [N/A:N/A] Wound Location: Left Calcaneus N/A N/A Wounding Event: Gradually Appeared N/A N/A Primary Etiology: Pressure Ulcer N/A N/A Comorbid History: Cataracts, Hypertension, Myocardial N/A N/A Infarction, Peripheral Arterial Disease, Type I Diabetes, End Stage Renal Disease, Gout, Seizure Disorder Date Acquired: 06/22/2020 N/A N/A Weeks of Treatment: 3 N/A N/A Wound Status: Open N/A N/A Measurements L x W x D (cm) 2.4x1.8x0.1 N/A N/A Area (cm) : 3.393 N/A N/A Volume (cm) : 0.339 N/A N/A % Reduction in Area: 36.00% N/A N/A % Reduction in Volume: 36.00% N/A N/A Classification: Unstageable/Unclassified N/A N/A Exudate Amount: Medium N/A N/A Exudate Type: Serosanguineous N/A N/A Exudate Color:  red, brown N/A N/A Granulation Amount: Small (1-33%) N/A N/A Granulation Quality: Red, Pink N/A N/A Necrotic Amount: Large (67-100%) N/A N/A Exposed Structures: Fat Layer (Subcutaneous Tissue): N/A N/A Yes Fascia: No Tendon: No Muscle: No Joint: No Bone: No Debridement: Debridement - Excisional N/A N/A Pre-procedure Verification/Time 11:19 N/A N/A Out Taken: Tissue Debrided: Subcutaneous, Slough N/A N/A Level: Skin/Subcutaneous Tissue N/A N/A Debridement Area (sq cm): 4.32 N/A N/A Instrument: Curette N/A N/A Bleeding: Moderate N/A N/A Hemostasis Achieved: Pressure N/A N/A Debridement Treatment Procedure was tolerated well N/A N/A Response: Post Debridement 2.4x1.8x0.3 N/A N/A Measurements L x W x D (cm) LUCCIA, REINHEIMER ([id number]) Post Debridement Volume: 1.018 N/A N/A (cm) Post Debridement Stage: Unstageable/Unclassified N/A N/A Procedures Performed: Debridement N/A N/A Treatment Notes Wound #1 (Calcaneus) Wound Laterality: Left Cleanser Peri-Wound Care Topical Santyl Collagenase Ointment, 30 (gm), tube Discharge Instruction: apply nickel thick to wound bed only Primary Dressing Gauze Discharge Instruction: As directed: dry, moistened  with saline or moistened with Dakins Solution Secondary Dressing Kerlix 4.5 x 4.1 (in/yd) Discharge Instruction: Wrapped lightly due to blood flow. Secured With 31M Medipore H Soft Cloth Surgical Tape, 2x2 (in/yd) Compression Wrap Compression Stockings Add-Ons Electronic Signature(s) Signed: 08/24/2020 5:15:48 PM By: Linton Ham MD Entered By: Linton Ham on 08/24/2020 12:44:19 Taylor Castaneda ([id number]) -------------------------------------------------------------------------------- [place] Plan Details Patient Name: KADYNCE, BONDS. Date of Service: 08/24/2020 10:45 AM Medical Record Number: [id number] Patient Account Number: [id number] Date of Birth/Sex: 1932-09-12 (88 y.o. F) Treating RN: Cornell Barman Primary Care Anahla Bevis: [name] Other Clinician: Jeanine Luz Referring Iriana Artley: [name] Treating Dorwin Fitzhenry/Extender: Tito Dine in Treatment: 3 Active Inactive Medication Nursing Diagnoses: Knowledge deficit related to medication safety: actual or potential Goals: Patient/caregiver will demonstrate understanding of all current medications Date Initiated: 08/03/2020 Target Resolution Date: 08/17/2020 Goal Status: Active Patient/caregiver will demonstrate understanding of new oral/IV medications prescribed at the [facility] (topical prescriptions are covered under the skin breakdown problem) Date Initiated: 08/03/2020 Target Resolution Date: 08/17/2020 Goal Status: Active Interventions: Assess for medication contraindications each visit where new medications are prescribed Assess patient/caregiver ability to manage medication regimen upon admission and as needed Patient/Caregiver given reconciled medication list upon admission, changes in medications and discharge from the [place] education on medication safety Treatment Activities: New medication prescribed at [place] : 08/03/2020 Notes: Necrotic Tissue Nursing  Diagnoses: Impaired tissue integrity related to necrotic/devitalized tissue Knowledge deficit related to management of necrotic/devitalized tissue Goals: Necrotic/devitalized tissue will be minimized in the wound bed Date Initiated: 08/03/2020 Target Resolution Date: 08/17/2020 Goal Status: Active Patient/caregiver will verbalize understanding of reason and process for debridement of necrotic tissue Date Initiated: 08/03/2020 Target Resolution Date: 08/17/2020 Goal Status: Active Interventions: Assess patient pain level pre-, during and post procedure and prior to discharge Provide education on necrotic tissue and debridement process Treatment Activities: Apply topical anesthetic as ordered : 08/03/2020 Excisional debridement : 08/03/2020 Notes: Orientation to the Wound Care Program Nursing Diagnoses: Knowledge deficit related to the wound healing center program LAMISHA, ROUSSELL ([id number]) Goals: Patient/caregiver will verbalize understanding of the [facility] [place] Program Date Initiated: 08/03/2020 Target Resolution Date: 08/17/2020 Goal Status: Active Interventions: Provide education on orientation to the wound center Notes: Wound/Skin Impairment Nursing Diagnoses: Impaired tissue integrity Knowledge deficit related to smoking impact on wound healing Knowledge deficit related to ulceration/compromised skin integrity Goals: Patient/caregiver will verbalize understanding of skin care regimen Date Initiated: 08/03/2020 Target Resolution Date: 08/17/2020 Goal Status: Active Ulcer/skin breakdown will have a volume  reduction of 30% by week 4 Date Initiated: 08/03/2020 Target Resolution Date: 09/03/2020 Goal Status: Active Interventions: Assess patient/caregiver ability to obtain necessary supplies Assess patient/caregiver ability to perform ulcer/skin care regimen upon admission and as needed Assess ulceration(s) every visit Treatment Activities: Patient referred to home care :  08/03/2020 Skin care regimen initiated : 08/03/2020 Topical wound management initiated : 08/03/2020 Notes: Electronic Signature(s) Signed: 08/24/2020 6:23:16 PM By: Gretta Cool, BSN, RN, CWS, Kim RN, BSN Entered By: Gretta Cool, BSN, RN, CWS, Kim on 08/24/2020 11:19:25 Taylor Castaneda ([id number]) -------------------------------------------------------------------------------- Pain Assessment Details Patient Name: Taylor Castaneda Date of Service: 08/24/2020 10:45 AM Medical Record Number: [id number] Patient Account Number: [id number] Date of Birth/Sex: [birth year] (88 y.o. F) Treating RN: Carlene Coria Primary Care Tighe Gitto: [name] Other Clinician: Jeanine Luz Referring Doxie Augenstein: [name] Treating Briscoe Daniello/Extender: Tito Dine in Treatment: 3 Active Problems Location of Pain Severity and Description of Pain Patient Has Paino No Site Locations Pain Management and Medication Current Pain Management: Electronic Signature(s) Signed: 08/29/2020 5:08:01 PM By: Carlene Coria RN Entered By: Carlene Coria on 08/24/2020 11:06:12 Taylor Castaneda ([id number]) -------------------------------------------------------------------------------- Patient/Caregiver Education Details Patient Name: Taylor Castaneda Date of Service: 08/24/2020 10:45 AM Medical Record Number: [id number] Patient Account Number: [id number] Date of Birth/Gender: [birth year] (88 y.o. F) Treating RN: Cornell Barman Primary Care Physician: [name] Other Clinician: Jeanine Luz Referring Physician: [name] Treating Physician/Extender: Tito Dine in Treatment: 3 Education Assessment Education Provided To: Patient Education Topics Provided Wound Debridement: Handouts: Wound Debridement Methods: Demonstration, Explain/Verbal Responses: State content correctly Wound/Skin Impairment: Handouts: Caring for Your Ulcer Methods: Demonstration, Explain/Verbal Responses: State content  correctly Electronic Signature(s) Signed: 08/24/2020 6:23:16 PM By: Gretta Cool, BSN, RN, CWS, Kim RN, BSN Entered By: Gretta Cool, BSN, RN, CWS, Kim on 08/24/2020 11:22:09 Taylor Castaneda ([id number]) -------------------------------------------------------------------------------- Wound Assessment Details Patient Name: Taylor Castaneda Date of Service: 08/24/2020 10:45 AM Medical Record Number: [id number] Patient Account Number: [id number] Date of Birth/Sex: 02-21-1933 (88 y.o. F) Treating RN: Carlene Coria Primary Care Niylah Hassan: [name] Other Clinician: Jeanine Luz Referring Sapir Lavey: [name] Treating Monicia Tse/Extender: Tito Dine in Treatment: 3 Wound Status Wound Number: 1 Primary Pressure Ulcer Etiology: Wound Location: Left Calcaneus Wound Open Wounding Event: Gradually Appeared Status: Date Acquired: 06/22/2020 Comorbid Cataracts, Hypertension, Myocardial Infarction, Peripheral Weeks Of Treatment: 3 History: Arterial Disease, Type I Diabetes, End Stage Renal Clustered Wound: No Disease, Gout, Seizure Disorder Photos Wound Measurements Length: (cm) 2.4 % Red Width: (cm) 1.8 % Red Depth: (cm) 0.1 [place] Area: (cm) 3.393 Unde Volume: (cm) 0.339 uction in Area: 36% uction in Volume: 36% ling: No rmining: No Wound Description Classification: Unstageable/Unclassified Foul Exudate Amount: Medium Slou Exudate Type: Serosanguineous Exudate Color: red, brown Odor After Cleansing: No gh/Fibrino Yes Wound Bed Granulation Amount: Small (1-33%) Exposed Structure Granulation Quality: Red, Pink Fascia Exposed: No Necrotic Amount: Large (67-100%) Fat Layer (Subcutaneous Tissue) Exposed: Yes Necrotic Quality: Adherent Slough Tendon Exposed: No Muscle Exposed: No Joint Exposed: No Bone Exposed: No Treatment Notes Wound #1 (Calcaneus) Wound Laterality: Left Cleanser Peri-Wound Care Topical Santyl Collagenase Ointment, 30 (gm), tube GENEA, RHEAUME ([id number]) Discharge Instruction: apply nickel thick to wound bed only Primary Dressing Gauze Discharge Instruction: As directed: dry, moistened with saline or moistened with Dakins Solution Secondary Dressing Kerlix 4.5 x 4.1 (in/yd) Discharge Instruction: Wrapped lightly due to blood flow. Secured With 54M Medipore H Soft Cloth Surgical Tape, 2x2 (in/yd) Compression Wrap Compression Stockings Add-Ons Electronic Signature(s) Signed: 08/29/2020 5:08:01  PM By: Carlene Coria RN Entered By: Carlene Coria on 08/24/2020 11:10:38 Taylor Castaneda ([id number]) -------------------------------------------------------------------------------- [place] Details Patient Name: Taylor Castaneda Date of Service: 08/24/2020 10:45 AM Medical Record Number: [id number] Patient Account Number: [id number] Date of Birth/Sex: [birth year] (88 y.o. F) Treating RN: Carlene Coria Primary Care Kendal Ghazarian: [name] Other Clinician: Jeanine Luz Referring Rock Sobol: [name] Treating Ortha Metts/Extender: Tito Dine in Treatment: 3 Vital Signs Time Taken: 11:05 Temperature (F): 97.8 Pulse (bpm): 69 Respiratory Rate (breaths/min): 18 Blood Pressure (mmHg): 179/81 Reference Range: 80 - 120 mg / dl Electronic Signature(s) Signed: 08/29/2020 5:08:01 PM By: Carlene Coria RN Entered By: Carlene Coria on 08/24/2020 11:06:01

## 2020-08-31 ENCOUNTER — Other Ambulatory Visit: Payer: Self-pay

## 2020-08-31 ENCOUNTER — Encounter: Payer: Medicare Other | Admitting: Internal Medicine

## 2020-08-31 DIAGNOSIS — G40909 Epilepsy, unspecified, not intractable, without status epilepticus: Secondary | ICD-10-CM | POA: Diagnosis not present

## 2020-08-31 DIAGNOSIS — I129 Hypertensive chronic kidney disease with stage 1 through stage 4 chronic kidney disease, or unspecified chronic kidney disease: Secondary | ICD-10-CM | POA: Diagnosis not present

## 2020-08-31 DIAGNOSIS — E11621 Type 2 diabetes mellitus with foot ulcer: Secondary | ICD-10-CM | POA: Diagnosis not present

## 2020-08-31 DIAGNOSIS — Z8673 Personal history of transient ischemic attack (TIA), and cerebral infarction without residual deficits: Secondary | ICD-10-CM | POA: Diagnosis not present

## 2020-08-31 DIAGNOSIS — N1832 Chronic kidney disease, stage 3b: Secondary | ICD-10-CM | POA: Diagnosis not present

## 2020-08-31 DIAGNOSIS — E1151 Type 2 diabetes mellitus with diabetic peripheral angiopathy without gangrene: Secondary | ICD-10-CM | POA: Diagnosis not present

## 2020-08-31 DIAGNOSIS — E1122 Type 2 diabetes mellitus with diabetic chronic kidney disease: Secondary | ICD-10-CM | POA: Diagnosis not present

## 2020-08-31 DIAGNOSIS — L89620 Pressure ulcer of left heel, unstageable: Secondary | ICD-10-CM | POA: Diagnosis not present

## 2020-09-01 NOTE — Progress Notes (Signed)
Taylor Castaneda, Taylor Castaneda ([id number]) Visit Report for 08/31/2020 Arrival Information Details Patient Name: Taylor Castaneda, Taylor Castaneda. Date of Service: 08/31/2020 3:00 PM Medical Record Number: [id number] Patient Account Number: [id number] Date of Birth/Sex: 11/22/1932 (88 y.o. F) Treating RN: Cornell Barman Primary Care Izayiah Tibbitts: [name] Other Clinician: Jeanine Luz Referring Bethanee Redondo: [name] Treating Bleu Minerd/Extender: Tito Dine in Treatment: 4 Visit Information History Since Last Visit All ordered tests and consults were completed: No Patient Arrived: Wheel Chair Added or deleted any medications: No Arrival Time: 14:51 Any new allergies or adverse reactions: No Accompanied By: self Had a fall or experienced change in No Transfer Assistance: None activities of daily living that may affect Patient Identification Verified: Yes risk of falls: Secondary Verification Process Completed: Yes Signs or symptoms of abuse/neglect since last visito No Patient Requires Transmission-Based No Hospitalized since last visit: No Precautions: Implantable device outside of the clinic excluding No Patient Has Alerts: Yes cellular tissue based products placed in the center Patient Alerts: Patient on Blood since last visit: Thinner Has Dressing in Place as Prescribed: Yes Type I Diabetic Pain Present Now: No Electronic Signature(s) Signed: 08/31/2020 4:51:14 PM By: Jeanine Luz Entered By: Jeanine Luz on 08/31/2020 14:56:26 Taylor Castaneda ([id number]) -------------------------------------------------------------------------------- Encounter Discharge Information Details Patient Name: Taylor Castaneda Date of Service: 08/31/2020 3:00 PM Medical Record Number: [id number] Patient Account Number: [id number] Date of Birth/Sex: February 11, 1933 (88 y.o. F) Treating RN: Dolan Amen Primary Care Leita Lindbloom: [name] Other Clinician: Jeanine Luz Referring Eyan Hagood:  [name] Treating Orena Cavazos/Extender: Tito Dine in Treatment: 4 Encounter Discharge Information Items Post Procedure Vitals Discharge Condition: Stable Temperature (F): 97.7 Ambulatory Status: Wheelchair Pulse (bpm): 65 Discharge Destination: Home Respiratory Rate (breaths/min): 18 Transportation: Private Auto Blood Pressure (mmHg): 180/84 Accompanied By: son Schedule Follow-up Appointment: Yes Clinical Summary of Care: Electronic Signature(s) Signed: 08/31/2020 5:04:41 PM By: Georges Mouse, Minus Breeding RN Entered By: Georges Mouse, Minus Breeding on 08/31/2020 15:19:44 Taylor Castaneda ([id number]) -------------------------------------------------------------------------------- Lower Extremity Assessment Details Patient Name: Taylor Castaneda Date of Service: 08/31/2020 3:00 PM Medical Record Number: [id number] Patient Account Number: [id number] Date of Birth/Sex: 09-11-1932 (88 y.o. F) Treating RN: Cornell Barman Primary Care Tajuana Kniskern: [name] Other Clinician: Jeanine Luz Referring Elza Varricchio: [name] Treating Jeanpierre Thebeau/Extender: Ricard Dillon Weeks in Treatment: 4 Edema Assessment Assessed: [Left: No] [Right: No] Edema: [Left: Ye] [Right: s] Vascular Assessment Pulses: Dorsalis Pedis Palpable: [Left:Yes] Electronic Signature(s) Signed: 08/31/2020 4:51:14 PM By: Jeanine Luz Signed: 09/01/2020 9:44:32 AM By: Gretta Cool, BSN, RN, CWS, Kim RN, BSN Entered By: Jeanine Luz on 08/31/2020 15:02:20 Taylor Castaneda ([id number]) -------------------------------------------------------------------------------- Multi Wound Chart Details Patient Name: Taylor Castaneda, Taylor Castaneda Date of Service: 08/31/2020 3:00 PM Medical Record Number: [id number] Patient Account Number: [id number] Date of Birth/Sex: [birth year] (88 y.o. F) Treating RN: Cornell Barman Primary Care Shaketta Rill: [name] Other Clinician: Jeanine Luz Referring Kathi Dohn: [name] Treating Jamarii Banks/Extender: Tito Dine in Treatment: 4 Vital Signs Height(in): Pulse(bpm): 65 Weight(lbs): Blood Pressure(mmHg): 180/84 Body Mass Index(BMI): Temperature(F): 97.7 Respiratory Rate(breaths/min): 18 Photos: [N/A:N/A] Wound Location: Left Calcaneus N/A N/A Wounding Event: Gradually Appeared N/A N/A Primary Etiology: Pressure Ulcer N/A N/A Comorbid History: Cataracts, Hypertension, Myocardial N/A N/A Infarction, Peripheral Arterial Disease, Type I Diabetes, End Stage Renal Disease, Gout, Seizure Disorder Date Acquired: 06/22/2020 N/A N/A Weeks of Treatment: 4 N/A N/A Wound Status: Open N/A N/A Measurements L x W x D (cm) 2.5x2x0.2 N/A N/A Area (cm) : 3.927 N/A N/A Volume (cm) : 0.785 N/A N/A %  Reduction in Area: 25.90% N/A N/A % Reduction in Volume: -48.10% N/A N/A Classification: Unstageable/Unclassified N/A N/A Exudate Amount: Medium N/A N/A Exudate Type: Serosanguineous N/A N/A Exudate Color: red, brown N/A N/A Granulation Amount: Small (1-33%) N/A N/A Granulation Quality: Red, Pink N/A N/A Necrotic Amount: Large (67-100%) N/A N/A Exposed Structures: Fat Layer (Subcutaneous Tissue): N/A N/A Yes Fascia: No Tendon: No Muscle: No Joint: No Bone: No Epithelialization: None N/A N/A Debridement: Debridement - Excisional N/A N/A Pre-procedure Verification/Time 14:55 N/A N/A Out Taken: Tissue Debrided: Subcutaneous, Slough N/A N/A Level: Skin/Subcutaneous Tissue N/A N/A Debridement Area (sq cm): 5 N/A N/A Instrument: Curette N/A N/A Bleeding: Moderate N/A N/A Hemostasis Achieved: Pressure N/A N/A Debridement Treatment Procedure was tolerated well N/A N/A Response: 2.5x2x0.4 N/A N/A Taylor Castaneda, Taylor Castaneda ([id number]) Post Debridement Measurements L x W x D (cm) Post Debridement Volume: 1.571 N/A N/A (cm) Post Debridement Stage: Unstageable/Unclassified N/A N/A Procedures Performed: Debridement N/A N/A Treatment Notes Wound #1  (Calcaneus) Wound Laterality: Left Cleanser Peri-Wound Care Topical Santyl Collagenase Ointment, 30 (gm), tube Discharge Instruction: apply nickel thick to wound bed only Primary Dressing Gauze Discharge Instruction: As directed: dry, moistened with saline or moistened with Dakins Solution Secondary Dressing Kerlix 4.5 x 4.1 (in/yd) Discharge Instruction: Wrapped lightly due to blood flow. Secured With 89M Medipore H Soft Cloth Surgical Tape, 2x2 (in/yd) Compression Wrap Compression Stockings Add-Ons Electronic Signature(s) Signed: 09/01/2020 8:16:57 AM By: Linton Ham MD Entered By: Linton Ham on 08/31/2020 15:21:14 Taylor Castaneda ([id number]) -------------------------------------------------------------------------------- [facility] Plan Details Patient Name: Taylor Castaneda, Taylor Castaneda. Date of Service: 08/31/2020 3:00 PM Medical Record Number: [id number] Patient Account Number: [id number] Date of Birth/Sex: 07-04-1932 (88 y.o. F) Treating RN: Cornell Barman Primary Care Evelene Roussin: [name] Other Clinician: Jeanine Luz Referring Florentino Laabs: [name] Treating Sophi Calligan/Extender: Tito Dine in Treatment: 4 Active Inactive Medication Nursing Diagnoses: Knowledge deficit related to medication safety: actual or potential Goals: Patient/caregiver will demonstrate understanding of all current medications Date Initiated: 08/03/2020 Target Resolution Date: 08/17/2020 Goal Status: Active Patient/caregiver will demonstrate understanding of new oral/IV medications prescribed at the [facility] (topical prescriptions are covered under the skin breakdown problem) Date Initiated: 08/03/2020 Target Resolution Date: 08/17/2020 Goal Status: Active Interventions: Assess for medication contraindications each visit where new medications are prescribed Assess patient/caregiver ability to manage medication regimen upon admission and as needed Patient/Caregiver given  reconciled medication list upon admission, changes in medications and discharge from the [facility] education on medication safety Treatment Activities: New medication prescribed at [facility] : 08/03/2020 Notes: Necrotic Tissue Nursing Diagnoses: Impaired tissue integrity related to necrotic/devitalized tissue Knowledge deficit related to management of necrotic/devitalized tissue Goals: Necrotic/devitalized tissue will be minimized in the wound bed Date Initiated: 08/03/2020 Target Resolution Date: 08/17/2020 Goal Status: Active Patient/caregiver will verbalize understanding of reason and process for debridement of necrotic tissue Date Initiated: 08/03/2020 Target Resolution Date: 08/17/2020 Goal Status: Active Interventions: Assess patient pain level pre-, during and post procedure and prior to discharge Provide education on necrotic tissue and debridement process Treatment Activities: Apply topical anesthetic as ordered : 08/03/2020 Excisional debridement : 08/03/2020 Notes: Orientation to the Wound Care Program Nursing Diagnoses: Knowledge deficit related to the wound healing center program Taylor Castaneda, Taylor Castaneda ([id number]) Goals: Patient/caregiver will verbalize understanding of the [place] Program Date Initiated: 08/03/2020 Target Resolution Date: 08/17/2020 Goal Status: Active Interventions: Provide education on orientation to the wound center Notes: Wound/Skin Impairment Nursing Diagnoses: Impaired tissue integrity Knowledge deficit related to smoking impact on wound  healing Knowledge deficit related to ulceration/compromised skin integrity Goals: Patient/caregiver will verbalize understanding of skin care regimen Date Initiated: 08/03/2020 Target Resolution Date: 08/17/2020 Goal Status: Active Ulcer/skin breakdown will have a volume reduction of 30% by week 4 Date Initiated: 08/03/2020 Target Resolution Date: 09/03/2020 Goal Status:  Active Interventions: Assess patient/caregiver ability to obtain necessary supplies Assess patient/caregiver ability to perform ulcer/skin care regimen upon admission and as needed Assess ulceration(s) every visit Treatment Activities: Patient referred to home care : 08/03/2020 Skin care regimen initiated : 08/03/2020 Topical wound management initiated : 08/03/2020 Notes: Electronic Signature(s) Signed: 09/01/2020 9:44:32 AM By: Gretta Cool, BSN, RN, CWS, Kim RN, BSN Entered By: Gretta Cool, BSN, RN, CWS, Kim on 08/31/2020 15:07:54 Taylor Castaneda ([id number]) -------------------------------------------------------------------------------- Pain Assessment Details Patient Name: Taylor Castaneda Date of Service: 08/31/2020 3:00 PM Medical Record Number: [id number] Patient Account Number: [id number] Date of Birth/Sex: 26-Apr-1933 (88 y.o. F) Treating RN: Cornell Barman Primary Care Juanetta Negash: [name] Other Clinician: Jeanine Luz Referring Shamicka Inga: [name] Treating Lynett Brasil/Extender: Tito Dine in Treatment: 4 Active Problems Location of Pain Severity and Description of Pain Patient Has Paino No Site Locations Pain Management and Medication Current Pain Management: Electronic Signature(s) Signed: 08/31/2020 4:51:14 PM By: Jeanine Luz Signed: 09/01/2020 9:44:32 AM By: Gretta Cool, BSN, RN, CWS, Kim RN, BSN Entered By: Jeanine Luz on 08/31/2020 14:57:02 Taylor Castaneda ([id number]) -------------------------------------------------------------------------------- Patient/Caregiver Education Details Patient Name: Taylor Castaneda Date of Service: 08/31/2020 3:00 PM Medical Record Number: [id number] Patient Account Number: [id number] Date of Birth/Gender: April 30, 1933 (88 y.o. F) Treating RN: Cornell Barman Primary Care Physician: [name] Other Clinician: Jeanine Luz Referring Physician: [name] Treating Physician/Extender: Tito Dine in  Treatment: 4 Education Assessment Education Provided To: Patient Education Topics Provided Engineer, maintenance) Signed: 09/01/2020 9:44:32 AM By: Gretta Cool, BSN, RN, CWS, Kim RN, BSN Entered By: Gretta Cool, BSN, RN, CWS, Kim on 08/31/2020 15:11:35 Taylor Castaneda ([id number]) -------------------------------------------------------------------------------- Wound Assessment Details Patient Name: Taylor Castaneda Date of Service: 08/31/2020 3:00 PM Medical Record Number: [id number] Patient Account Number: [id number] Date of Birth/Sex: Sep 02, 1932 (88 y.o. F) Treating RN: Cornell Barman Primary Care Dusan Lipford: [name] Other Clinician: Jeanine Luz Referring Jaylanie Boschee: [name] Treating Jeris Roser/Extender: Tito Dine in Treatment: 4 Wound Status Wound Number: 1 Primary Pressure Ulcer Etiology: Wound Location: Left Calcaneus Wound Open Wounding Event: Gradually Appeared Status: Date Acquired: 06/22/2020 Comorbid Cataracts, Hypertension, Myocardial Infarction, Peripheral Weeks Of Treatment: 4 History: Arterial Disease, Type I Diabetes, End Stage Renal Clustered Wound: No Disease, Gout, Seizure Disorder Photos Wound Measurements Length: (cm) 2.5 % Red Width: (cm) 2 % Red Depth: (cm) 0.2 Epith Area: (cm) 3.927 Tunn Volume: (cm) 0.785 Unde uction in Area: 25.9% uction in Volume: -48.1% elialization: None eling: No rmining: No Wound Description Classification: Unstageable/Unclassified Foul Exudate Amount: Medium Slou Exudate Type: Serosanguineous Exudate Color: red, brown Odor After Cleansing: No gh/Fibrino Yes Wound Bed Granulation Amount: Small (1-33%) Exposed Structure Granulation Quality: Red, Pink Fascia Exposed: No Necrotic Amount: Large (67-100%) Fat Layer (Subcutaneous Tissue) Exposed: Yes Necrotic Quality: Adherent Slough Tendon Exposed: No Muscle Exposed: No Joint Exposed: No Bone Exposed: No Treatment Notes Wound #1 (Calcaneus) Wound  Laterality: Left Cleanser Peri-Wound Care Topical Santyl Collagenase Ointment, 30 (gm), tube Taylor Castaneda, Taylor Castaneda ([id number]) Discharge Instruction: apply nickel thick to wound bed only Primary Dressing Gauze Discharge Instruction: As directed: dry, moistened with saline or moistened with Dakins Solution Secondary Dressing Kerlix 4.5 x 4.1 (in/yd) Discharge Instruction: Wrapped lightly due to blood flow.  Secured With 6M Medipore H Soft Cloth Surgical Tape, 2x2 (in/yd) Compression Wrap Compression Stockings Add-Ons Electronic Signature(s) Signed: 08/31/2020 4:51:14 PM By: Jeanine Luz Signed: 09/01/2020 9:44:32 AM By: Gretta Cool, BSN, RN, CWS, Kim RN, BSN Entered By: Jeanine Luz on 08/31/2020 15:01:42 Taylor Castaneda ([id number]) -------------------------------------------------------------------------------- Vitals Details Patient Name: Taylor Castaneda Date of Service: 08/31/2020 3:00 PM Medical Record Number: [id number] Patient Account Number: [id number] Date of Birth/Sex: Dec 27, 1932 (88 y.o. F) Treating RN: Cornell Barman Primary Care Kaniyah Lisby: [name] Other Clinician: Jeanine Luz Referring Leonie Amacher: [name] Treating Tsion Inghram/Extender: Tito Dine in Treatment: 4 Vital Signs Time Taken: 14:56 Temperature (F): 97.7 Pulse (bpm): 65 Respiratory Rate (breaths/min): 18 Blood Pressure (mmHg): 180/84 Reference Range: 80 - 120 mg / dl Electronic Signature(s) Signed: 08/31/2020 4:51:14 PM By: Jeanine Luz Entered By: Jeanine Luz on 08/31/2020 14:56:47

## 2020-09-01 NOTE — Progress Notes (Signed)
ZYLIE, MUMAW ([id number]) Visit Report for 08/31/2020 Debridement Details Patient Name: Taylor Castaneda, Taylor Castaneda. Date of Service: 08/31/2020 3:00 PM Medical Record Number: [id number] Patient Account Number: [id number] Date of Birth/Sex: August 02, 1932 (88 y.o. F) Treating RN: Cornell Barman Primary Care Provider: [name] Other Clinician: Jeanine Luz Referring Provider: [name] Treating Provider/Extender: Tito Dine in Treatment: 4 Debridement Performed for Wound #1 Left Calcaneus Assessment: Performed By: Physician Ricard Dillon, MD Debridement Type: Debridement Level of Consciousness (Pre- Awake and Alert procedure): Pre-procedure Verification/Time Out Yes - 14:55 Taken: Total Area Debrided (L x W): 2.5 (cm) x 2 (cm) = 5 (cm) Tissue and other material Non-Viable, Slough, Subcutaneous, Slough debrided: Level: Skin/Subcutaneous Tissue Debridement Description: Excisional Instrument: Curette Bleeding: Moderate Hemostasis Achieved: Pressure Response to Treatment: Procedure was tolerated well Level of Consciousness (Post- Awake and Alert procedure): Post Debridement Measurements of Total Wound Length: (cm) 2.5 Stage: Unstageable/Unclassified Width: (cm) 2 Depth: (cm) 0.4 Volume: (cm) 1.571 Character of Wound/Ulcer Post Debridement: Stable Post Procedure Diagnosis Same as Pre-procedure Electronic Signature(s) Signed: 09/01/2020 8:16:57 AM By: Linton Ham MD Signed: 09/01/2020 9:44:32 AM By: Gretta Cool, BSN, RN, CWS, Kim RN, BSN Entered By: Linton Ham on 08/31/2020 15:21:28 Taylor Castaneda ([id number]) -------------------------------------------------------------------------------- HPI Details Patient Name: Taylor Castaneda Date of Service: 08/31/2020 3:00 PM Medical Record Number: [id number] Patient Account Number: [id number] Date of Birth/Sex: 03/07/1933 (88 y.o. F) Treating RN: Cornell Barman Primary Care Provider: [name] Other  Clinician: Jeanine Luz Referring Provider: [name] Treating Provider/Extender: Tito Dine in Treatment: 4 History of Present Illness HPI Description: ADMISSION 08/03/2020 This is a 87 year old woman who was in the hospital last month suffering a right CVA with mild residual left-sided symptoms. Sometime during this hospitalization or shortly after she got home they noted that she had an ulcer on her left heel. Saw her primary doctor on 2/25 who correctly pointed out this was unstageable and referred her here. Patient does not complain of any pain. She has home health [[facility]] but we are not exactly sure what they are currently using for wound dressing. Past medical history includes hypertension, type 2 diabetes, stage IIIb chronic renal failure, recent right CVA is noted, laparoscopic cholecystectomy also last month, gout, seizures ABI in our clinic was 0.71 on the left 08/10/2020 upon evaluation today patient appears to be doing well with regard to her heel ulcer in my opinion. Fortunately there does not appear to be any signs of active infection at this time. No fever chills noted She does have an appointment or rather referral to get an appointment with vascular hopefully we can get that scheduled soon they called and left a message that has not been set up yet though were given the information for them to call the care of that today. 3/23; using Santyl on the pressure ulcer on her left heel. She has had a thorough vascular evaluation. Noncompressible bilaterally however also with monophasic waveforms and low TBI's with a TBI in the right of 0.38 and on the left 0.36. She was seen by vascular. They felt this was a pressure ulcer I think most everybody agrees with this. They also had a long discussion with the patient's son with regards to her PAD. She has diminished waveforms in the posterior tibial artery which is the vessel that perfuses her heel. Nevertheless I  think the agreement was a period of watchful waiting to see if Santyl and aggressive wound care can heal this area. The more aggressive approach  of course is an angiogram. I talked to the patient about this today with her son they are indeed in favor of a period of watchful waiting to see if wound care can get this area to close. She does not describe claudication with her minimal activity or at rest 3/30; using Santyl on her pressure ulcer on the tip of her heel. She also has PAD but we have been managing this conservatively [see my note above]. Electronic Signature(s) Signed: 09/01/2020 8:16:57 AM By: Linton Ham MD Entered By: Linton Ham on 08/31/2020 15:21:59 Taylor Castaneda ([id number]) -------------------------------------------------------------------------------- Physical Exam Details Patient Name: Taylor Castaneda Date of Service: 08/31/2020 3:00 PM Medical Record Number: [id number] Patient Account Number: [id number] Date of Birth/Sex: [birth year] (88 y.o. F) Treating RN: Cornell Barman Primary Care Provider: [name] Other Clinician: Jeanine Luz Referring Provider: [name] Treating Provider/Extender: Tito Dine in Treatment: 4 Constitutional Patient is hypertensive.. Pulse regular and within target range for patient.Marland Kitchen Respirations regular, non-labored and within target range.. Temperature is normal and within the target range for the patient.Marland Kitchen appears in no distress. Notes Wound exam; the patient still requires debridement but this looks better every week with the Santyl I am going to continue this until we have healthy granulated surface on presentation. Then will probably move to endoform Electronic Signature(s) Signed: 09/01/2020 8:16:57 AM By: Linton Ham MD Entered By: Linton Ham on 08/31/2020 15:22:44 Taylor Castaneda ([id number]) -------------------------------------------------------------------------------- Physician  Orders Details Patient Name: Taylor Castaneda Date of Service: 08/31/2020 3:00 PM Medical Record Number: [id number] Patient Account Number: [id number] Date of Birth/Sex: Mar 15, 1933 (88 y.o. F) Treating RN: Cornell Barman Primary Care Provider: [name] Other Clinician: Jeanine Luz Referring Provider: [name] Treating Provider/Extender: Tito Dine in Treatment: 4 Verbal / Phone Orders: No Diagnosis Coding Follow-up Appointments Wound #1 Left Calcaneus o Return Appointment in 1 week. Bathing/ Shower/ Hygiene o May shower; gently cleanse wound with antibacterial soap, rinse and pat dry prior to dressing wounds Off-Loading o Other: - Float heels to keep pressure off of them. Additional Orders / Instructions o Follow Nutritious Diet and Increase Protein Intake Medications-Please add to medication list. o Santyl Enzymatic Ointment - If Santyl is not affordable, use Medihoney or Manuka Honey over the counter. Wound Treatment Wound #1 - Calcaneus Wound Laterality: Left Topical: Santyl Collagenase Ointment, 30 (gm), tube (Home Health) 1 x Per Day/30 Days Discharge Instructions: apply nickel thick to wound bed only Primary Dressing: Gauze ([place]) 1 x Per Day/30 Days Discharge Instructions: As directed: dry, moistened with saline or moistened with Dakins Solution Secondary Dressing: Kerlix 4.5 x 4.1 (in/yd) (Home Health) 1 x Per Day/30 Days Discharge Instructions: Wrapped lightly due to blood flow. Secured With: 70M Medipore H Soft Cloth Surgical Tape, 2x2 (in/yd) (Home Health) 1 x Per Day/30 Days Electronic Signature(s) Signed: 09/01/2020 8:16:57 AM By: Linton Ham MD Signed: 09/01/2020 9:44:32 AM By: Gretta Cool, BSN, RN, CWS, Kim RN, BSN Entered By: Gretta Cool, BSN, RN, CWS, Kim on 08/31/2020 15:11:18 Taylor Castaneda ([id number]) -------------------------------------------------------------------------------- Problem List Details Patient Name:  JASMA, SEEVERS Date of Service: 08/31/2020 3:00 PM Medical Record Number: [id number] Patient Account Number: [id number] Date of Birth/Sex: [birth year] (88 y.o. F) Treating RN: Cornell Barman Primary Care Provider: [name] Other Clinician: Jeanine Luz Referring Provider: [name] Treating Provider/Extender: Tito Dine in Treatment: 4 Active Problems ICD-10 Encounter Code Description Active Date MDM Diagnosis E11.621 Type 2 diabetes mellitus with foot ulcer 08/03/2020 No Yes L89.620  Pressure ulcer of left heel, unstageable 08/03/2020 No Yes E11.51 Type 2 diabetes mellitus with diabetic peripheral angiopathy without 08/03/2020 No Yes gangrene Inactive Problems Resolved Problems Electronic Signature(s) Signed: 09/01/2020 8:16:57 AM By: Linton Ham MD Entered By: Linton Ham on 08/31/2020 15:21:01 Taylor Castaneda ([id number]) -------------------------------------------------------------------------------- Progress Note Details Patient Name: Taylor Castaneda Date of Service: 08/31/2020 3:00 PM Medical Record Number: [id number] Patient Account Number: [id number] Date of Birth/Sex: 1932/11/20 (88 y.o. F) Treating RN: Cornell Barman Primary Care Provider: [name] Other Clinician: Jeanine Luz Referring Provider: [name] Treating Provider/Extender: Tito Dine in Treatment: 4 Subjective History of Present Illness (HPI) ADMISSION 08/03/2020 This is a 87 year old woman who was in the hospital last month suffering a right CVA with mild residual left-sided symptoms. Sometime during this hospitalization or shortly after she got home they noted that she had an ulcer on her left heel. Saw her primary doctor on 2/25 who correctly pointed out this was unstageable and referred her here. Patient does not complain of any pain. She has home health [[facility]] but we are not exactly sure what they are currently using for wound  dressing. Past medical history includes hypertension, type 2 diabetes, stage IIIb chronic renal failure, recent right CVA is noted, laparoscopic cholecystectomy also last month, gout, seizures ABI in our clinic was 0.71 on the left 08/10/2020 upon evaluation today patient appears to be doing well with regard to her heel ulcer in my opinion. Fortunately there does not appear to be any signs of active infection at this time. No fever chills noted She does have an appointment or rather referral to get an appointment with vascular hopefully we can get that scheduled soon they called and left a message that has not been set up yet though were given the information for them to call the care of that today. 3/23; using Santyl on the pressure ulcer on her left heel. She has had a thorough vascular evaluation. Noncompressible bilaterally however also with monophasic waveforms and low TBI's with a TBI in the right of 0.38 and on the left 0.36. She was seen by vascular. They felt this was a pressure ulcer I think most everybody agrees with this. They also had a long discussion with the patient's son with regards to her PAD. She has diminished waveforms in the posterior tibial artery which is the vessel that perfuses her heel. Nevertheless I think the agreement was a period of watchful waiting to see if Santyl and aggressive wound care can heal this area. The more aggressive approach of course is an angiogram. I talked to the patient about this today with her son they are indeed in favor of a period of watchful waiting to see if wound care can get this area to close. She does not describe claudication with her minimal activity or at rest 3/30; using Santyl on her pressure ulcer on the tip of her heel. She also has PAD but we have been managing this conservatively [see my note above]. Objective Constitutional Patient is hypertensive.. Pulse regular and within target range for patient.Marland Kitchen Respirations regular,  non-labored and within target range.. Temperature is normal and within the target range for the patient.Marland Kitchen appears in no distress. Vitals Time Taken: 2:56 PM, Temperature: 97.7 F, Pulse: 65 bpm, Respiratory Rate: 18 breaths/min, Blood Pressure: 180/84 mmHg. General Notes: Wound exam; the patient still requires debridement but this looks better every week with the Santyl I am going to continue this until we have healthy granulated surface  on presentation. Then will probably move to endoform Integumentary (Hair, Skin) Wound #1 status is Open. Original cause of wound was Gradually Appeared. The date acquired was: 06/22/2020. The wound has been in treatment 4 weeks. The wound is located on the Left Calcaneus. The wound measures 2.5cm length x 2cm width x 0.2cm depth; 3.927cm^2 area and 0.785cm^3 volume. There is Fat Layer (Subcutaneous Tissue) exposed. There is no tunneling or undermining noted. There is a medium amount of serosanguineous drainage noted. There is small (1-33%) red, pink granulation within the wound bed. There is a large (67-100%) amount of necrotic tissue within the wound bed including Adherent Slough. Assessment Active Problems ANUSHA, CLAUS ([id number]) ICD-10 Type 2 diabetes mellitus with foot ulcer Pressure ulcer of left heel, unstageable Type 2 diabetes mellitus with diabetic peripheral angiopathy without gangrene Procedures Wound #1 Pre-procedure diagnosis of Wound #1 is a Pressure Ulcer located on the Left Calcaneus . There was a Excisional Skin/Subcutaneous Tissue Debridement with a total area of 5 sq cm performed by Ricard Dillon, MD. With the following instrument(s): Curette to remove Non- Viable tissue/material. Material removed includes Subcutaneous Tissue and Slough and. No specimens were taken. A time out was conducted at 14:55, prior to the start of the procedure. A Moderate amount of bleeding was controlled with Pressure. The procedure was tolerated well.  Post Debridement Measurements: 2.5cm length x 2cm width x 0.4cm depth; 1.571cm^3 volume. Post debridement Stage noted as Unstageable/Unclassified. Character of Wound/Ulcer Post Debridement is stable. Post procedure Diagnosis Wound #1: Same as Pre-Procedure Plan Follow-up Appointments: Wound #1 Left Calcaneus: Return Appointment in 1 week. Bathing/ Shower/ Hygiene: May shower; gently cleanse wound with antibacterial soap, rinse and pat dry prior to dressing wounds Off-Loading: Other: - Float heels to keep pressure off of them. Additional Orders / Instructions: Follow Nutritious Diet and Increase Protein Intake Medications-Please add to medication list.: Santyl Enzymatic Ointment - If Santyl is not affordable, use Medihoney or Manuka Honey over the counter. WOUND #1: - Calcaneus Wound Laterality: Left Topical: Santyl Collagenase Ointment, 30 (gm), tube (Home Health) 1 x Per Day/30 Days Discharge Instructions: apply nickel thick to wound bed only Primary Dressing: Gauze ([place]) 1 x Per Day/30 Days Discharge Instructions: As directed: dry, moistened with saline or moistened with Dakins Solution Secondary Dressing: Kerlix 4.5 x 4.1 (in/yd) (Home Health) 1 x Per Day/30 Days Discharge Instructions: Wrapped lightly due to blood flow. Secured With: 64M Medipore H Soft Cloth Surgical Tape, 2x2 (in/yd) (Home Health) 1 x Per Day/30 Days 1. Continue Santyl as outlined. Careful follow-up next week Electronic Signature(s) Signed: 09/01/2020 8:16:57 AM By: Linton Ham MD Entered By: Linton Ham on 08/31/2020 15:23:00 Taylor Castaneda ([id number]) -------------------------------------------------------------------------------- SuperBill Details Patient Name: Taylor Castaneda Date of Service: 08/31/2020 Medical Record Number: [id number] Patient Account Number: [id number] Date of Birth/Sex: 12/24/1932 (88 y.o. F) Treating RN: Cornell Barman Primary Care Provider: [name] Other  Clinician: Jeanine Luz Referring Provider: [name] Treating Provider/Extender: Tito Dine in Treatment: 4 Diagnosis Coding ICD-10 Codes Code Description E11.621 Type 2 diabetes mellitus with foot ulcer L89.620 Pressure ulcer of left heel, unstageable E11.51 Type 2 diabetes mellitus with diabetic peripheral angiopathy without gangrene Facility Procedures CPT4 Code: [id number] Description: 26378 - DEB SUBQ TISSUE 20 SQ CM/< Modifier: Quantity: 1 CPT4 Code: Description: ICD-10 Diagnosis Description L89.620 Pressure ulcer of left heel, unstageable E11.621 Type 2 diabetes mellitus with foot ulcer Modifier: Quantity: Physician Procedures CPT4 Code: [id number] Description: 74128 - WC PHYS SUBQ  TISS 20 SQ CM Modifier: Quantity: 1 CPT4 Code: Description: ICD-10 Diagnosis Description L89.620 Pressure ulcer of left heel, unstageable E11.621 Type 2 diabetes mellitus with foot ulcer Modifier: Quantity: Electronic Signature(s) Signed: 09/01/2020 8:16:57 AM By: Linton Ham MD Entered By: Linton Ham on 08/31/2020 15:23:22

## 2020-09-07 ENCOUNTER — Other Ambulatory Visit: Payer: Self-pay

## 2020-09-07 ENCOUNTER — Encounter: Payer: Medicare Other | Attending: Internal Medicine | Admitting: Internal Medicine

## 2020-09-07 DIAGNOSIS — G40909 Epilepsy, unspecified, not intractable, without status epilepticus: Secondary | ICD-10-CM | POA: Insufficient documentation

## 2020-09-07 DIAGNOSIS — E1051 Type 1 diabetes mellitus with diabetic peripheral angiopathy without gangrene: Secondary | ICD-10-CM | POA: Insufficient documentation

## 2020-09-07 DIAGNOSIS — L89620 Pressure ulcer of left heel, unstageable: Secondary | ICD-10-CM | POA: Diagnosis not present

## 2020-09-07 DIAGNOSIS — E1022 Type 1 diabetes mellitus with diabetic chronic kidney disease: Secondary | ICD-10-CM | POA: Diagnosis not present

## 2020-09-07 DIAGNOSIS — N1832 Chronic kidney disease, stage 3b: Secondary | ICD-10-CM | POA: Diagnosis not present

## 2020-09-07 DIAGNOSIS — Z8673 Personal history of transient ischemic attack (TIA), and cerebral infarction without residual deficits: Secondary | ICD-10-CM | POA: Insufficient documentation

## 2020-09-07 DIAGNOSIS — E10621 Type 1 diabetes mellitus with foot ulcer: Secondary | ICD-10-CM | POA: Insufficient documentation

## 2020-09-07 DIAGNOSIS — I129 Hypertensive chronic kidney disease with stage 1 through stage 4 chronic kidney disease, or unspecified chronic kidney disease: Secondary | ICD-10-CM | POA: Diagnosis not present

## 2020-09-08 NOTE — Progress Notes (Signed)
Taylor Castaneda, Taylor Castaneda ([id number]) Visit Report for 09/07/2020 Arrival Information Details Patient Name: Taylor Castaneda, Taylor Castaneda. Date of Service: 09/07/2020 2:30 PM Medical Record Number: [id number] Patient Account Number: [id number] Date of Birth/Sex: 1932-06-15 (88 y.o. F) Treating RN: Donnamarie Poag Primary Care Cornelious Bartolucci: [name] Other Clinician: Jeanine Luz Referring Sinda Leedom: [name] Treating Monterrio Gerst/Extender: Tito Dine in Treatment: 5 Visit Information History Since Last Visit Added or deleted any medications: No Patient Arrived: Wheel Chair Had a fall or experienced change in No Arrival Time: 14:50 activities of daily living that may affect Accompanied By: son risk of falls: Transfer Assistance: EasyP[employer] Patient Lift Hospitalized since last visit: No Patient Identification Verified: Yes Has Dressing in Place as Prescribed: Yes Secondary Verification Process Completed: Yes Pain Present Now: No Patient Requires Transmission-Based No Precautions: Patient Has Alerts: Yes Patient Alerts: Patient on Blood Thinner Type I Diabetic Electronic Signature(s) Signed: 09/07/2020 4:51:02 PM By: Donnamarie Poag Entered By: Donnamarie Poag on 09/07/2020 14:52:27 Taylor Castaneda ([id number]) -------------------------------------------------------------------------------- Encounter Discharge Information Details Patient Name: Taylor Castaneda Date of Service: 09/07/2020 2:30 PM Medical Record Number: [id number] Patient Account Number: [id number] Date of Birth/Sex: [birth year] (88 y.o. F) Treating RN: Cornell Barman Primary Care Francella Barnett: [name] Other Clinician: Jeanine Luz Referring Gildo Crisco: [name] Treating Keirsten Matuska/Extender: Tito Dine in Treatment: 5 Encounter Discharge Information Items Post Procedure Vitals Discharge Condition: Stable Temperature (F): 98.1 Ambulatory Status: Wheelchair Pulse (bpm): 71 Discharge Destination:  Home Respiratory Rate (breaths/min): 18 Transportation: Private Auto Blood Pressure (mmHg): 187/76 Accompanied By: son Schedule Follow-up Appointment: Yes Clinical Summary of Care: Electronic Signature(s) Signed: 09/07/2020 5:25:07 PM By: Gretta Cool, BSN, RN, CWS, Kim RN, BSN Entered By: Gretta Cool, BSN, RN, CWS, Kim on 09/07/2020 15:49:54 Taylor Castaneda ([id number]) -------------------------------------------------------------------------------- Lower Extremity Assessment Details Patient Name: Taylor Castaneda Date of Service: 09/07/2020 2:30 PM Medical Record Number: [id number] Patient Account Number: [id number] Date of Birth/Sex: May 10, 1933 (88 y.o. F) Treating RN: Donnamarie Poag Primary Care Leelynd Maldonado: [name] Other Clinician: Jeanine Luz Referring Corwin Kuiken: [name] Treating Hobson Lax/Extender: Ricard Dillon Weeks in Treatment: 5 Edema Assessment Assessed: [Left: Yes] [Right: No] Edema: [Left: N] [Right: o] Vascular Assessment Pulses: Dorsalis Pedis Palpable: [Left:Yes] Electronic Signature(s) Signed: 09/07/2020 4:51:02 PM By: Donnamarie Poag Entered By: Donnamarie Poag on 09/07/2020 14:57:31 Castaneda, Taylor Friar ([id number]) -------------------------------------------------------------------------------- Multi Wound Chart Details Patient Name: Taylor Castaneda Date of Service: 09/07/2020 2:30 PM Medical Record Number: [id number] Patient Account Number: [id number] Date of Birth/Sex: 11-05-1932 (88 y.o. F) Treating RN: Cornell Barman Primary Care Latia Mataya: [name] Other Clinician: Jeanine Luz Referring Ladonte Verstraete: [name] Treating Cesilia Shinn/Extender: Tito Dine in Treatment: 5 Vital Signs Height(in): Pulse(bpm): 43 Weight(lbs): Blood Pressure(mmHg): 187/76 Body Mass Index(BMI): Temperature(F): 98.1 Respiratory Rate(breaths/min): 18 Photos: [N/A:N/A] Wound Location: Left Calcaneus N/A N/A Wounding Event: Gradually Appeared N/A  N/A Primary Etiology: Pressure Ulcer N/A N/A Comorbid History: Cataracts, Hypertension, Myocardial N/A N/A Infarction, Peripheral Arterial Disease, Type I Diabetes, End Stage Renal Disease, Gout, Seizure Disorder Date Acquired: 06/22/2020 N/A N/A Weeks of Treatment: 5 N/A N/A Wound Status: Open N/A N/A Measurements L x W x D (cm) 1.9x1.9x0.2 N/A N/A Area (cm) : 2.835 N/A N/A Volume (cm) : 0.567 N/A N/A % Reduction in Area: 46.50% N/A N/A % Reduction in Volume: -7.00% N/A N/A Classification: Unstageable/Unclassified N/A N/A Exudate Amount: Large N/A N/A Exudate Type: Serous N/A N/A Exudate Color: amber N/A N/A Granulation Amount: Small (1-33%) N/A N/A Granulation Quality: Red, Pink N/A N/A Necrotic Amount: Large (67-100%)  N/A N/A Exposed Structures: Fat Layer (Subcutaneous Tissue): N/A N/A Yes Fascia: No Tendon: No Muscle: No Joint: No Bone: No Epithelialization: None N/A N/A Debridement: Debridement - Excisional N/A N/A Pre-procedure Verification/Time 15:39 N/A N/A Out Taken: Tissue Debrided: Subcutaneous, Slough N/A N/A Level: Skin/Subcutaneous Tissue N/A N/A Debridement Area (sq cm): 3.61 N/A N/A Instrument: Curette N/A N/A Bleeding: Moderate N/A N/A Hemostasis Achieved: Pressure N/A N/A Debridement Treatment Procedure was tolerated well N/A N/A Response: 1.9x1.9x0.2 N/A N/A Taylor Castaneda, Taylor Castaneda ([id number]) Post Debridement Measurements L x W x D (cm) Post Debridement Volume: 0.567 N/A N/A (cm) Post Debridement Stage: Unstageable/Unclassified N/A N/A Procedures Performed: Debridement N/A N/A Treatment Notes Electronic Signature(s) Signed: 09/08/2020 7:59:07 AM By: Linton Ham MD Entered By: Linton Ham on 09/07/2020 15:42:04 Taylor Castaneda ([id number]) -------------------------------------------------------------------------------- Multi-Disciplinary Care Plan Details Patient Name: Taylor Castaneda Date of Service: 09/07/2020 2:30 PM Medical Record  Number: [id number] Patient Account Number: [id number] Date of Birth/Sex: 10/03/1932 (88 y.o. F) Treating RN: Cornell Barman Primary Care Matayah Reyburn: [name] Other Clinician: Jeanine Luz Referring Kimmy Totten: [name] Treating Merilyn Pagan/Extender: Tito Dine in Treatment: 5 Active Inactive Medication Nursing Diagnoses: Knowledge deficit related to medication safety: actual or potential Goals: Patient/caregiver will demonstrate understanding of all current medications Date Initiated: 08/03/2020 Target Resolution Date: 08/17/2020 Goal Status: Active Patient/caregiver will demonstrate understanding of new oral/IV medications prescribed at the [facility] (topical prescriptions are covered under the skin breakdown problem) Date Initiated: 08/03/2020 Target Resolution Date: 08/17/2020 Goal Status: Active Interventions: Assess for medication contraindications each visit where new medications are prescribed Assess patient/caregiver ability to manage medication regimen upon admission and as needed Patient/Caregiver given reconciled medication list upon admission, changes in medications and discharge from the [place] education on medication safety Treatment Activities: New medication prescribed at [place] : 08/03/2020 Notes: Necrotic Tissue Nursing Diagnoses: Impaired tissue integrity related to necrotic/devitalized tissue Knowledge deficit related to management of necrotic/devitalized tissue Goals: Necrotic/devitalized tissue will be minimized in the wound bed Date Initiated: 08/03/2020 Target Resolution Date: 08/17/2020 Goal Status: Active Patient/caregiver will verbalize understanding of reason and process for debridement of necrotic tissue Date Initiated: 08/03/2020 Target Resolution Date: 08/17/2020 Goal Status: Active Interventions: Assess patient pain level pre-, during and post procedure and prior to discharge Provide education on necrotic tissue and  debridement process Treatment Activities: Apply topical anesthetic as ordered : 08/03/2020 Excisional debridement : 08/03/2020 Notes: Orientation to the Wound Care Program Nursing Diagnoses: Knowledge deficit related to the wound healing center program Taylor Castaneda, Taylor Castaneda ([id number]) Goals: Patient/caregiver will verbalize understanding of the [place] Program Date Initiated: 08/03/2020 Target Resolution Date: 08/17/2020 Goal Status: Active Interventions: Provide education on orientation to the wound center Notes: Wound/Skin Impairment Nursing Diagnoses: Impaired tissue integrity Knowledge deficit related to smoking impact on wound healing Knowledge deficit related to ulceration/compromised skin integrity Goals: Patient/caregiver will verbalize understanding of skin care regimen Date Initiated: 08/03/2020 Target Resolution Date: 08/17/2020 Goal Status: Active Ulcer/skin breakdown will have a volume reduction of 30% by week 4 Date Initiated: 08/03/2020 Target Resolution Date: 09/03/2020 Goal Status: Active Interventions: Assess patient/caregiver ability to obtain necessary supplies Assess patient/caregiver ability to perform ulcer/skin care regimen upon admission and as needed Assess ulceration(s) every visit Treatment Activities: Patient referred to home care : 08/03/2020 Skin care regimen initiated : 08/03/2020 Topical wound management initiated : 08/03/2020 Notes: Electronic Signature(s) Signed: 09/07/2020 5:25:07 PM By: Gretta Cool, BSN, RN, CWS, Kim RN, BSN Entered By: Gretta Cool, BSN, RN, CWS, Kim on 09/07/2020 15:37:57 Castaneda,  Taylor Friar ([id number]) -------------------------------------------------------------------------------- Pain Assessment Details Patient Name: Taylor Castaneda, Taylor Castaneda. Date of Service: 09/07/2020 2:30 PM Medical Record Number: [id number] Patient Account Number: [id number] Date of Birth/Sex: Oct 18, 1932 (88 y.o. F) Treating RN: Donnamarie Poag Primary Care Thomasene Dubow: [name] Other Clinician: Jeanine Luz Referring Azlyn Wingler: [name] Treating Taijuan Serviss/Extender: Tito Dine in Treatment: 5 Active Problems Location of Pain Severity and Description of Pain Patient Has Paino No Site Locations Rate the pain. Current Pain Level: 0 Pain Management and Medication Current Pain Management: Electronic Signature(s) Signed: 09/07/2020 4:51:02 PM By: Donnamarie Poag Entered By: Donnamarie Poag on 09/07/2020 14:53:25 Taylor Castaneda ([id number]) -------------------------------------------------------------------------------- Wound Assessment Details Patient Name: Taylor Castaneda Date of Service: 09/07/2020 2:30 PM Medical Record Number: [id number] Patient Account Number: [id number] Date of Birth/Sex: Jul 27, 1932 (88 y.o. F) Treating RN: Donnamarie Poag Primary Care Hira Trent: [name] Other Clinician: Jeanine Luz Referring Ranferi Clingan: [name] Treating Jourdyn Ferrin/Extender: Tito Dine in Treatment: 5 Wound Status Wound Number: 1 Primary Pressure Ulcer Etiology: Wound Location: Left Calcaneus Wound Open Wounding Event: Gradually Appeared Status: Date Acquired: 06/22/2020 Comorbid Cataracts, Hypertension, Myocardial Infarction, Peripheral Weeks Of Treatment: 5 History: Arterial Disease, Type I Diabetes, End Stage Renal Clustered Wound: No Disease, Gout, Seizure Disorder Photos Wound Measurements Length: (cm) 1.9 % Red Width: (cm) 1.9 % Red Depth: (cm) 0.2 Epith Area: (cm) 2.835 Tunn Volume: (cm) 0.567 Unde uction in Area: 46.5% uction in Volume: -7% elialization: None eling: No rmining: No Wound Description Classification: Unstageable/Unclassified Foul Exudate Amount: Large Slou Exudate Type: Serous Exudate Color: amber Odor After Cleansing: No gh/Fibrino Yes Wound Bed Granulation Amount: Small (1-33%) Exposed Structure Granulation Quality: Red, Pink Fascia Exposed: No Necrotic Amount: Large  (67-100%) Fat Layer (Subcutaneous Tissue) Exposed: Yes Necrotic Quality: Adherent Slough Tendon Exposed: No Muscle Exposed: No Joint Exposed: No Bone Exposed: No Treatment Notes Wound #1 (Calcaneus) Wound Laterality: Left Cleanser Peri-Wound Care Topical Santyl Collagenase Ointment, 30 (gm), tube Taylor Castaneda, Taylor Castaneda ([id number]) Discharge Instruction: apply nickel thick to wound bed only Primary Dressing Gauze Discharge Instruction: As directed: dry, moistened with saline or moistened with Dakins Solution Secondary Dressing Kerlix 4.5 x 4.1 (in/yd) Discharge Instruction: Wrapped lightly due to blood flow. Secured With 21M Medipore H Soft Cloth Surgical Tape, 2x2 (in/yd) Compression Wrap Compression Stockings Add-Ons Electronic Signature(s) Signed: 09/07/2020 4:51:02 PM By: Donnamarie Poag Entered By: Donnamarie Poag on 09/07/2020 14:57:05 Taylor Castaneda ([id number]) -------------------------------------------------------------------------------- Vitals Details Patient Name: Taylor Castaneda Date of Service: 09/07/2020 2:30 PM Medical Record Number: [id number] Patient Account Number: [id number] Date of Birth/Sex: [birth year] (88 y.o. F) Treating RN: Donnamarie Poag Primary Care Katricia Prehn: [name] Other Clinician: Jeanine Luz Referring Sadae Arrazola: [name] Treating Jedediah Noda/Extender: Tito Dine in Treatment: 5 Vital Signs Time Taken: 14:52 Temperature (F): 98.1 Pulse (bpm): 71 Respiratory Rate (breaths/min): 18 Blood Pressure (mmHg): 187/76 Reference Range: 80 - 120 mg / dl Electronic Signature(s) Signed: 09/07/2020 4:51:02 PM By: Donnamarie Poag Entered ByDonnamarie Poag on 09/07/2020 14:53:18

## 2020-09-08 NOTE — Progress Notes (Signed)
BRANDAN, GLAUBER ([id number]) Visit Report for 09/07/2020 Debridement Details Patient Name: Taylor Castaneda, Taylor Castaneda. Date of Service: 09/07/2020 2:30 PM Medical Record Number: [id number] Patient Account Number: [id number] Date of Birth/Sex: 1932/09/17 (88 y.o. F) Treating RN: Cornell Barman Primary Care Provider: [name] Other Clinician: Jeanine Luz Referring Provider: [name] Treating Provider/Extender: Tito Dine in Treatment: 5 Debridement Performed for Wound #1 Left Calcaneus Assessment: Performed By: Physician Ricard Dillon, MD Debridement Type: Debridement Level of Consciousness (Pre- Awake and Alert procedure): Pre-procedure Verification/Time Out Yes - 15:39 Taken: Total Area Debrided (L x W): 1.9 (cm) x 1.9 (cm) = 3.61 (cm) Tissue and other material Viable, Non-Viable, Slough, Subcutaneous, Slough debrided: Level: Skin/Subcutaneous Tissue Debridement Description: Excisional Instrument: Curette Bleeding: Moderate Hemostasis Achieved: Pressure Response to Treatment: Procedure was tolerated well Level of Consciousness (Post- Awake and Alert procedure): Post Debridement Measurements of Total Wound Length: (cm) 1.9 Stage: Unstageable/Unclassified Width: (cm) 1.9 Depth: (cm) 0.2 Volume: (cm) 0.567 Character of Wound/Ulcer Post Debridement: Stable Post Procedure Diagnosis Same as Pre-procedure Electronic Signature(s) Signed: 09/07/2020 5:25:07 PM By: Gretta Cool, BSN, RN, CWS, Kim RN, BSN Signed: 09/08/2020 7:59:07 AM By: Linton Ham MD Entered By: Linton Ham on 09/07/2020 15:42:14 Taylor Castaneda ([id number]) -------------------------------------------------------------------------------- HPI Details Patient Name: Taylor Castaneda Date of Service: 09/07/2020 2:30 PM Medical Record Number: [id number] Patient Account Number: [id number] Date of Birth/Sex: 06-17-1932 (88 y.o. F) Treating RN: Cornell Barman Primary Care Provider: [name] Other Clinician: Jeanine Luz Referring Provider: [name] Treating Provider/Extender: Tito Dine in Treatment: 5 History of Present Illness HPI Description: ADMISSION 08/03/2020 This is a 87 year old woman who was in the hospital last month suffering a right CVA with mild residual left-sided symptoms. Sometime during this hospitalization or shortly after she got home they noted that she had an ulcer on her left heel. Saw her primary doctor on 2/25 who correctly pointed out this was unstageable and referred her here. Patient does not complain of any pain. She has home health [[facility]] but we are not exactly sure what they are currently using for wound dressing. Past medical history includes hypertension, type 2 diabetes, stage IIIb chronic renal failure, recent right CVA is noted, laparoscopic cholecystectomy also last month, gout, seizures ABI in our clinic was 0.71 on the left 08/10/2020 upon evaluation today patient appears to be doing well with regard to her heel ulcer in my opinion. Fortunately there does not appear to be any signs of active infection at this time. No fever chills noted She does have an appointment or rather referral to get an appointment with vascular hopefully we can get that scheduled soon they called and left a message that has not been set up yet though were given the information for them to call the care of that today. 3/23; using Santyl on the pressure ulcer on her left heel. She has had a thorough vascular evaluation. Noncompressible bilaterally however also with monophasic waveforms and low TBI's with a TBI in the right of 0.38 and on the left 0.36. She was seen by vascular. They felt this was a pressure ulcer I think most everybody agrees with this. They also had a long discussion with the patient's son with regards to her PAD. She has diminished waveforms in the posterior tibial artery which is the vessel that perfuses her heel.  Nevertheless I think the agreement was a period of watchful waiting to see if Santyl and aggressive wound care can heal this area. The more aggressive  approach of course is an angiogram. I talked to the patient about this today with her son they are indeed in favor of a period of watchful waiting to see if wound care can get this area to close. She does not describe claudication with her minimal activity or at rest 3/30; using Santyl on her pressure ulcer on the tip of her heel. She also has PAD but we have been managing this conservatively [see my note above]. 4/6; still using Santyl. We are making improvements in surface area. This was initially unstageable but is down to the muscle layer. Electronic Signature(s) Signed: 09/08/2020 7:59:07 AM By: Linton Ham MD Entered By: Linton Ham on 09/07/2020 15:42:50 Taylor Castaneda ([id number]) -------------------------------------------------------------------------------- Physical Exam Details Patient Name: Taylor Castaneda, Taylor Castaneda Date of Service: 09/07/2020 2:30 PM Medical Record Number: [id number] Patient Account Number: [id number] Date of Birth/Sex: 1932-09-09 (88 y.o. F) Treating RN: Cornell Barman Primary Care Provider: [name] Other Clinician: Jeanine Luz Referring Provider: [name] Treating Provider/Extender: Tito Dine in Treatment: 5 Cardiovascular Pulses are not palpable. Notes Wound exam; debridement with a #3 curette of adherent fibrinous debris. Each time this gets easier and the surface looks better. There is no evidence of surrounding infection Electronic Signature(s) Signed: 09/08/2020 7:59:07 AM By: Linton Ham MD Entered By: Linton Ham on 09/07/2020 15:43:31 Taylor Castaneda, Taylor Castaneda ([id number]) -------------------------------------------------------------------------------- Physician Orders Details Patient Name: Taylor Castaneda Date of Service: 09/07/2020 2:30 PM Medical Record Number:  [id number] Patient Account Number: [id number] Date of Birth/Sex: 09-Apr-1933 (88 y.o. F) Treating RN: Cornell Barman Primary Care Provider: [name] Other Clinician: Jeanine Luz Referring Provider: [name] Treating Provider/Extender: Tito Dine in Treatment: 5 Verbal / Phone Orders: No Diagnosis Coding Follow-up Appointments Wound #1 Left Calcaneus o Return Appointment in 1 week. Bathing/ Shower/ Hygiene o May shower; gently cleanse wound with antibacterial soap, rinse and pat dry prior to dressing wounds Off-Loading o Other: - Float heels to keep pressure off of them. Additional Orders / Instructions o Follow Nutritious Diet and Increase Protein Intake Medications-Please add to medication list. o Santyl Enzymatic Ointment - If Santyl is not affordable, use Medihoney or Manuka Honey over the counter. Wound Treatment Wound #1 - Calcaneus Wound Laterality: Left Topical: Santyl Collagenase Ointment, 30 (gm), tube (Home Health) 1 x Per Day/30 Days Discharge Instructions: apply nickel thick to wound bed only Primary Dressing: Gauze ([place]) 1 x Per Day/30 Days Discharge Instructions: As directed: dry, moistened with saline or moistened with Dakins Solution Secondary Dressing: Kerlix 4.5 x 4.1 (in/yd) (Home Health) 1 x Per Day/30 Days Discharge Instructions: Wrapped lightly due to blood flow. Secured With: 83M Medipore H Soft Cloth Surgical Tape, 2x2 (in/yd) (Home Health) 1 x Per Day/30 Days Electronic Signature(s) Signed: 09/07/2020 5:25:07 PM By: Gretta Cool, BSN, RN, CWS, Kim RN, BSN Signed: 09/08/2020 7:59:07 AM By: Linton Ham MD Entered By: Gretta Cool, BSN, RN, CWS, Kim on 09/07/2020 15:41:32 Taylor Castaneda ([id number]) -------------------------------------------------------------------------------- Problem List Details Patient Name: Taylor Castaneda, Taylor Castaneda Date of Service: 09/07/2020 2:30 PM Medical Record Number: [id number] Patient Account Number:  [id number] Date of Birth/Sex: 11/09/1932 (88 y.o. F) Treating RN: Cornell Barman Primary Care Provider: [name] Other Clinician: Jeanine Luz Referring Provider: [name] Treating Provider/Extender: Tito Dine in Treatment: 5 Active Problems ICD-10 Encounter Code Description Active Date MDM Diagnosis E11.621 Type 2 diabetes mellitus with foot ulcer 08/03/2020 No Yes L89.620 Pressure ulcer of left heel, unstageable 08/03/2020 No Yes E11.51 Type 2 diabetes mellitus  with diabetic peripheral angiopathy without 08/03/2020 No Yes gangrene Inactive Problems Resolved Problems Electronic Signature(s) Signed: 09/08/2020 7:59:07 AM By: Linton Ham MD Entered By: Linton Ham on 09/07/2020 15:41:56 Taylor Castaneda ([id number]) -------------------------------------------------------------------------------- Progress Note Details Patient Name: Taylor Castaneda Date of Service: 09/07/2020 2:30 PM Medical Record Number: [id number] Patient Account Number: [id number] Date of Birth/Sex: Jul 30, 1932 (88 y.o. F) Treating RN: Cornell Barman Primary Care Provider: [name] Other Clinician: Jeanine Luz Referring Provider: [name] Treating Provider/Extender: Tito Dine in Treatment: 5 Subjective History of Present Illness (HPI) ADMISSION 08/03/2020 This is a 87 year old woman who was in the hospital last month suffering a right CVA with mild residual left-sided symptoms. Sometime during this hospitalization or shortly after she got home they noted that she had an ulcer on her left heel. Saw her primary doctor on 2/25 who correctly pointed out this was unstageable and referred her here. Patient does not complain of any pain. She has home health [[facility]] but we are not exactly sure what they are currently using for wound dressing. Past medical history includes hypertension, type 2 diabetes, stage IIIb chronic renal failure, recent right CVA is  noted, laparoscopic cholecystectomy also last month, gout, seizures ABI in our clinic was 0.71 on the left 08/10/2020 upon evaluation today patient appears to be doing well with regard to her heel ulcer in my opinion. Fortunately there does not appear to be any signs of active infection at this time. No fever chills noted She does have an appointment or rather referral to get an appointment with vascular hopefully we can get that scheduled soon they called and left a message that has not been set up yet though were given the information for them to call the care of that today. 3/23; using Santyl on the pressure ulcer on her left heel. She has had a thorough vascular evaluation. Noncompressible bilaterally however also with monophasic waveforms and low TBI's with a TBI in the right of 0.38 and on the left 0.36. She was seen by vascular. They felt this was a pressure ulcer I think most everybody agrees with this. They also had a long discussion with the patient's son with regards to her PAD. She has diminished waveforms in the posterior tibial artery which is the vessel that perfuses her heel. Nevertheless I think the agreement was a period of watchful waiting to see if Santyl and aggressive wound care can heal this area. The more aggressive approach of course is an angiogram. I talked to the patient about this today with her son they are indeed in favor of a period of watchful waiting to see if wound care can get this area to close. She does not describe claudication with her minimal activity or at rest 3/30; using Santyl on her pressure ulcer on the tip of her heel. She also has PAD but we have been managing this conservatively [see my note above]. 4/6; still using Santyl. We are making improvements in surface area. This was initially unstageable but is down to the muscle layer. Objective Constitutional Vitals Time Taken: 2:52 PM, Temperature: 98.1 F, Pulse: 71 bpm, Respiratory Rate: 18  breaths/min, Blood Pressure: 187/76 mmHg. Cardiovascular Pulses are not palpable. General Notes: Wound exam; debridement with a #3 curette of adherent fibrinous debris. Each time this gets easier and the surface looks better. There is no evidence of surrounding infection Integumentary (Hair, Skin) Wound #1 status is Open. Original cause of wound was Gradually Appeared. The date acquired was: 06/22/2020.  The wound has been in treatment 5 weeks. The wound is located on the Left Calcaneus. The wound measures 1.9cm length x 1.9cm width x 0.2cm depth; 2.835cm^2 area and 0.567cm^3 volume. There is Fat Layer (Subcutaneous Tissue) exposed. There is no tunneling or undermining noted. There is a large amount of serous drainage noted. There is small (1-33%) red, pink granulation within the wound bed. There is a large (67-100%) amount of necrotic tissue within the wound bed including Adherent Slough. Assessment Taylor Castaneda, Taylor Castaneda ([id number]) Active Problems ICD-10 Type 2 diabetes mellitus with foot ulcer Pressure ulcer of left heel, unstageable Type 2 diabetes mellitus with diabetic peripheral angiopathy without gangrene Procedures Wound #1 Pre-procedure diagnosis of Wound #1 is a Pressure Ulcer located on the Left Calcaneus . There was a Excisional Skin/Subcutaneous Tissue Debridement with a total area of 3.61 sq cm performed by Ricard Dillon, MD. With the following instrument(s): Curette to remove Viable and Non-Viable tissue/material. Material removed includes Subcutaneous Tissue and Slough and. No specimens were taken. A time out was conducted at 15:39, prior to the start of the procedure. A Moderate amount of bleeding was controlled with Pressure. The procedure was tolerated well. Post Debridement Measurements: 1.9cm length x 1.9cm width x 0.2cm depth; 0.567cm^3 volume. Post debridement Stage noted as Unstageable/Unclassified. Character of Wound/Ulcer Post Debridement is stable. Post procedure  Diagnosis Wound #1: Same as Pre-Procedure Plan Follow-up Appointments: Wound #1 Left Calcaneus: Return Appointment in 1 week. Bathing/ Shower/ Hygiene: May shower; gently cleanse wound with antibacterial soap, rinse and pat dry prior to dressing wounds Off-Loading: Other: - Float heels to keep pressure off of them. Additional Orders / Instructions: Follow Nutritious Diet and Increase Protein Intake Medications-Please add to medication list.: Santyl Enzymatic Ointment - If Santyl is not affordable, use Medihoney or Manuka Honey over the counter. WOUND #1: - Calcaneus Wound Laterality: Left Topical: Santyl Collagenase Ointment, 30 (gm), tube (Home Health) 1 x Per Day/30 Days Discharge Instructions: apply nickel thick to wound bed only Primary Dressing: Gauze ([place]) 1 x Per Day/30 Days Discharge Instructions: As directed: dry, moistened with saline or moistened with Dakins Solution Secondary Dressing: Kerlix 4.5 x 4.1 (in/yd) (Home Health) 1 x Per Day/30 Days Discharge Instructions: Wrapped lightly due to blood flow. Secured With: 19M Medipore H Soft Cloth Surgical Tape, 2x2 (in/yd) (Home Health) 1 x Per Day/30 Days 1. I am going to continue the [facility] for another week. 2. Debridement to continue until I get down to healthy red tissue. Then will change to endoform 3. The patient has known PAD but because of her age and frailty her family is elected to manage this conservatively unless intervention is absolutely necessary. For now we seem to be making improvements Electronic Signature(s) Signed: 09/08/2020 7:59:07 AM By: Linton Ham MD Entered By: Linton Ham on 09/07/2020 15:44:22 Taylor Castaneda ([id number]) -------------------------------------------------------------------------------- SuperBill Details Patient Name: Taylor Castaneda Date of Service: 09/07/2020 Medical Record Number: [id number] Patient Account Number: [id number] Date of Birth/Sex: January 25, 1933 (88 y.o.  F) Treating RN: Cornell Barman Primary Care Provider: [name] Other Clinician: Jeanine Luz Referring Provider: [name] Treating Provider/Extender: Tito Dine in Treatment: 5 Diagnosis Coding ICD-10 Codes Code Description E11.621 Type 2 diabetes mellitus with foot ulcer L89.620 Pressure ulcer of left heel, unstageable E11.51 Type 2 diabetes mellitus with diabetic peripheral angiopathy without gangrene Facility Procedures CPT4 Code: [id number] Description: 09628 - DEB SUBQ TISSUE 20 SQ CM/< Modifier: Quantity: 1 CPT4 Code: Description: ICD-10 Diagnosis Description L89.620 Pressure  ulcer of left heel, unstageable Modifier: Quantity: Physician Procedures CPT4 Code: [id number] Description: 47185 - WC PHYS SUBQ TISS 20 SQ CM Modifier: Quantity: 1 CPT4 Code: Description: ICD-10 Diagnosis Description L89.620 Pressure ulcer of left heel, unstageable Modifier: Quantity: Electronic Signature(s) Signed: 09/08/2020 7:59:07 AM By: Linton Ham MD Entered By: Linton Ham on 09/07/2020 15:44:36

## 2020-09-09 ENCOUNTER — Telehealth: Payer: Self-pay

## 2020-09-09 DIAGNOSIS — E1165 Type 2 diabetes mellitus with hyperglycemia: Secondary | ICD-10-CM | POA: Diagnosis not present

## 2020-09-09 NOTE — Chronic Care Management (AMB) (Addendum)
Chronic Care Management Pharmacy Assistant   Name: Taylor Castaneda  MRN: [id number] DOB: 10-27-1932  Reason for Encounter: Disease State  HTN   Conditions to be addressed/monitored: HTN  Recent office visits:  08/19/2020 Dr.Gutierrez PCP - Increased losartan from 25 mg to 50 mg daily, decreased pantoprazole from 40 mg twice daily to 40 mg once daily   Recent consult visits:  09/07/2020 Dr.Michael Dellia Nims Int.Medicine/ Wound Care 08/31/2020 Dr.Michael Dellia Nims 08/24/2020 Dr.Michael Dellia Nims 08/15/2020 Eulogio Ditch NP  [facility] 08/12/2020 Dr.Andrew Kirsteins Physical Medicine and Rehab 08/10/2020 Jeri Cos PA-C Wound Care  [facility] visits:  05/30/20 - 06/10/20 - Discharged from rehab post infarct, see med rec in chart   Medications: Outpatient Encounter Medications as of 09/09/2020  Medication Sig   acetaminophen (TYLENOL) 325 MG tablet Take 325-650 mg by mouth every 6 (six) hours as needed (for pain).   aspirin EC 325 MG EC tablet Take 1 tablet (325 mg total) by mouth daily.   atorvastatin (LIPITOR) 40 MG tablet Take 1 tablet (40 mg total) by mouth daily at 6 PM.   Biotin 5000 MCG TABS Take by mouth.   Carboxymethylcellul-Glycerin 1-0.25 % SOLN Place 1-2 drops into both eyes 3 (three) times daily as needed (for dry/irritated eyes.).   carvedilol (COREG) 12.5 MG tablet Take 1 tablet (12.5 mg total) by mouth 2 (two) times daily.   Cholecalciferol (VITAMIN D3) 25 MCG (1000 UT) CAPS Take 1 capsule (1,000 Units total) by mouth daily.   Cinnamon 500 MG capsule Take 1,000 mg by mouth daily.   clopidogrel (PLAVIX) 75 MG tablet Take 1 tablet (75 mg total) by mouth daily.   colchicine 0.6 MG tablet Take 1 tablet (0.6 mg total) by mouth daily as needed (gout flare). First day of gout flare, may take 1 tablet twice daily.   collagenase (SANTYL) ointment Apply 1 application topically daily.   CONTOUR NEXT TEST test strip 1 each 4 (four) times daily.   diclofenac Sodium (VOLTAREN)  1 % GEL Apply 2 g topically 4 (four) times daily.   Ferrous Sulfate (IRON) 325 (65 Fe) MG TABS Take 1 tablet (325 mg total) by mouth See admin instructions. Take 325 mg by mouth in the evening on Mondays and Thursdays only   insulin glargine (LANTUS) 100 UNIT/ML Solostar Pen Inject 8 Units into the skin daily.   levETIRAcetam (KEPPRA) 750 MG tablet Take 1 tablet (750 mg total) by mouth 2 (two) times daily.   losartan (COZAAR) 50 MG tablet Take 1 tablet (50 mg total) by mouth daily.   Multiple Vitamins-Minerals (PRESERVISION AREDS 2 PO) Take 1 capsule by mouth 2 (two) times daily.   nystatin cream (MYCOSTATIN) Apply 1 application topically 2 (two) times daily.   pantoprazole (PROTONIX) 40 MG tablet Take 1 tablet (40 mg total) by mouth daily.   No facility-administered encounter medications on file as of 09/09/2020.   Recent Office Vitals: BP Readings from Last 3 Encounters:  08/19/20 (!) 164/64  08/15/20 (!) 177/58  08/12/20 124/68   Pulse Readings from Last 3 Encounters:  08/19/20 62  08/15/20 76  08/12/20 71    Wt Readings from Last 3 Encounters:  08/19/20 106 lb 3 oz (48.2 kg)  08/15/20 106 lb 12.8 oz (48.4 kg)  08/12/20 106 lb (48.1 kg)     Kidney Function Lab Results  Component Value Date/Time   CREATININE 1.01 (H) 08/26/2020 02:55 PM   CREATININE 1.17 07/29/2020 10:39 AM   CREATININE  1.28 (H) 07/12/2020 05:11 PM   GFR 42.38 (L) 07/29/2020 10:39 AM   GFRNONAA 31 (L) 06/24/2020 05:34 AM   GFRAA 46 (L) 03/06/2019 03:12 AM    BMP Latest Ref Rng & Units 08/26/2020 07/29/2020 07/12/2020  Glucose 65 - 99 mg/dL 157(H) 152(H) 226(H)  BUN 7 - 25 mg/dL 41(H) 24(H) 30(H)  Creatinine 0.60 - 0.88 mg/dL 1.01(H) 1.17 1.28(H)  BUN/Creat Ratio 6 - 22 (calc) 41(H) - -  Sodium 135 - 146 mmol/L 140 139 137  Potassium 3.5 - 5.3 mmol/L 4.1 5.0 4.5  Chloride 98 - 110 mmol/L 109 103 104  CO2 20 - 32 mmol/L 18(L) 27 23  Calcium 8.6 - 10.4 mg/dL 8.7 9.7 8.5    Current antihypertensive regimen:    Carvedilol 12.5 mg - 1 tablet BID  Losartan  50 mg - 1 tablet daily   Patient verbally confirms she is taking the above medications as directed. Yes  How often are you checking your Blood Pressure? twice daily  she checks her blood pressure in the morning before taking her medication.   Current home BP readings:  DATE:             BP               PULSE  09/11/2020 B 137/73  69   EM 144/66  67 09/10/2020 B 138/71  66   EM 188/75  72 09/09/2020 B 152/67  59   EM 162/69  65 09/08/2020 B 134/71  66   EM 159/77  62 09/07/2020 B 164/64  57   EM 173/80  66 09/06/2020 B 144/72  61   EM 182/75  64 09/05/2020 B 159/69  65   EM 154/77  73  Wrist or arm cuff: arm Caffeine intake:none Salt intake:  Uses salt lighty on food. OTC medications including pseudoephedrine or NSAIDs? None   Any readings above 180/120? No If yes any symptoms of hypertensive emergency? patient denies any symptoms of high blood pressure   What recent interventions/DTPs have been made by any provider to improve Blood Pressure control since last CPP Visit:  08/19/2020  Dr.Gutierrez increased Losartan to 50 mg   Any recent hospitalizations or ED visits since last visit with CPP? No  What diet changes have been made to improve Blood Pressure Control?   Per the patients son, she limits her salt intake.  What exercise is being done to improve your Blood Pressure Control?  Per the patients son,  She gets outside now with the warm weather and walks around, and also she has a bench that she sits on and chat with her friends.   Adherence Review: Is the patient currently on ACE/ARB medication? Yes Does the patient have >5 day gap between last estimated fill dates? No   Star Rating Drugs:  Medication:  Last Fill: Day Supply Atorvastatin 40mg . 08/06/2020 90ds Losartan 50mg . 08/19/2020 30ds  Follow-Up:  Pharmacist Review  [name], CPP notified  Avel Sensor, [place] Assistant [phone]  I  have reviewed the care management and care coordination activities outlined in this encounter and I am certifying that I agree with the content of this note.   [name], PharmD Clinical Pharmacist [facility] [phone]

## 2020-09-12 ENCOUNTER — Ambulatory Visit: Payer: Medicare Other | Admitting: Family Medicine

## 2020-09-14 ENCOUNTER — Other Ambulatory Visit: Payer: Self-pay

## 2020-09-14 ENCOUNTER — Encounter: Payer: Medicare Other | Admitting: Internal Medicine

## 2020-09-14 DIAGNOSIS — G40909 Epilepsy, unspecified, not intractable, without status epilepticus: Secondary | ICD-10-CM | POA: Diagnosis not present

## 2020-09-14 DIAGNOSIS — E1051 Type 1 diabetes mellitus with diabetic peripheral angiopathy without gangrene: Secondary | ICD-10-CM | POA: Diagnosis not present

## 2020-09-14 DIAGNOSIS — N1832 Chronic kidney disease, stage 3b: Secondary | ICD-10-CM | POA: Diagnosis not present

## 2020-09-14 DIAGNOSIS — L89620 Pressure ulcer of left heel, unstageable: Secondary | ICD-10-CM | POA: Diagnosis not present

## 2020-09-14 DIAGNOSIS — E10621 Type 1 diabetes mellitus with foot ulcer: Secondary | ICD-10-CM | POA: Diagnosis not present

## 2020-09-14 DIAGNOSIS — E1022 Type 1 diabetes mellitus with diabetic chronic kidney disease: Secondary | ICD-10-CM | POA: Diagnosis not present

## 2020-09-14 DIAGNOSIS — I129 Hypertensive chronic kidney disease with stage 1 through stage 4 chronic kidney disease, or unspecified chronic kidney disease: Secondary | ICD-10-CM | POA: Diagnosis not present

## 2020-09-14 DIAGNOSIS — Z8673 Personal history of transient ischemic attack (TIA), and cerebral infarction without residual deficits: Secondary | ICD-10-CM | POA: Diagnosis not present

## 2020-09-14 NOTE — Progress Notes (Signed)
AZYAH, FLETT ([id number]) Visit Report for 09/14/2020 Debridement Details Patient Name: Taylor Castaneda, Taylor Castaneda. Date of Service: 09/14/2020 10:45 AM Medical Record Number: [id number] Patient Account Number: [id number] Date of Birth/Sex: [birth year] (88 y.o. F) Treating RN: Dolan Amen Primary Care Provider: [name] Other Clinician: Jeanine Luz Referring Provider: [name] Treating Provider/Extender: Tito Dine in Treatment: 6 Debridement Performed for Wound #1 Left Calcaneus Assessment: Performed By: Physician Ricard Dillon, MD Debridement Type: Debridement Level of Consciousness (Pre- Awake and Alert procedure): Pre-procedure Verification/Time Out Yes - 11:27 Taken: Start Time: 11:27 Total Area Debrided (L x W): 1.5 (cm) x 1.3 (cm) = 1.95 (cm) Tissue and other material Viable, Non-Viable, Slough, Subcutaneous, Slough debrided: Level: Skin/Subcutaneous Tissue Debridement Description: Excisional Instrument: Curette Bleeding: Minimum Hemostasis Achieved: Pressure Response to Treatment: Procedure was tolerated well Level of Consciousness (Post- Awake and Alert procedure): Post Debridement Measurements of Total Wound Length: (cm) 1.5 Stage: Unstageable/Unclassified Width: (cm) 1.3 Depth: (cm) 0.3 Volume: (cm) 0.459 Character of Wound/Ulcer Post Debridement: Stable Post Procedure Diagnosis Same as Pre-procedure Electronic Signature(s) Signed: 09/14/2020 4:47:42 PM By: Charlett Nose RN Signed: 09/14/2020 5:03:12 PM By: Linton Ham MD Entered By: Linton Ham on 09/14/2020 11:37:19 Taylor Castaneda ([id number]) -------------------------------------------------------------------------------- HPI Details Patient Name: Taylor Castaneda Date of Service: 09/14/2020 10:45 AM Medical Record Number: [id number] Patient Account Number: [id number] Date of Birth/Sex: 1933/04/05 (88 y.o. F) Treating RN: Dolan Amen Primary Care  Provider: [name] Other Clinician: Jeanine Luz Referring Provider: [name] Treating Provider/Extender: Tito Dine in Treatment: 6 History of Present Illness HPI Description: ADMISSION 08/03/2020 This is a 87 year old woman who was in the hospital last month suffering a right CVA with mild residual left-sided symptoms. Sometime during this hospitalization or shortly after she got home they noted that she had an ulcer on her left heel. Saw her primary doctor on 2/25 who correctly pointed out this was unstageable and referred her here. Patient does not complain of any pain. She has home health [[facility]] but we are not exactly sure what they are currently using for wound dressing. Past medical history includes hypertension, type 2 diabetes, stage IIIb chronic renal failure, recent right CVA is noted, laparoscopic cholecystectomy also last month, gout, seizures ABI in our clinic was 0.71 on the left 08/10/2020 upon evaluation today patient appears to be doing well with regard to her heel ulcer in my opinion. Fortunately there does not appear to be any signs of active infection at this time. No fever chills noted She does have an appointment or rather referral to get an appointment with vascular hopefully we can get that scheduled soon they called and left a message that has not been set up yet though were given the information for them to call the care of that today. 3/23; using Santyl on the pressure ulcer on her left heel. She has had a thorough vascular evaluation. Noncompressible bilaterally however also with monophasic waveforms and low TBI's with a TBI in the right of 0.38 and on the left 0.36. She was seen by vascular. They felt this was a pressure ulcer I think most everybody agrees with this. They also had a long discussion with the patient's son with regards to her PAD. She has diminished waveforms in the posterior tibial artery which is the vessel that  perfuses her heel. Nevertheless I think the agreement was a period of watchful waiting to see if Santyl and aggressive wound care can heal this area. The more aggressive  approach of course is an angiogram. I talked to the patient about this today with her son they are indeed in favor of a period of watchful waiting to see if wound care can get this area to close. She does not describe claudication with her minimal activity or at rest 3/30; using Santyl on her pressure ulcer on the tip of her heel. She also has PAD but we have been managing this conservatively [see my note above]. 4/6; still using Santyl. We are making improvements in surface area. This was initially unstageable but is down to the muscle layer. 4/13; still using Santyl. Continued nice improvements in surface area. Also the depth of the wound is improved Electronic Signature(s) Signed: 09/14/2020 5:03:12 PM By: Linton Ham MD Entered By: Linton Ham on 09/14/2020 11:41:46 Taylor Castaneda ([id number]) -------------------------------------------------------------------------------- Physical Exam Details Patient Name: Taylor Castaneda Date of Service: 09/14/2020 10:45 AM Medical Record Number: [id number] Patient Account Number: [id number] Date of Birth/Sex: 14-Sep-1932 (88 y.o. F) Treating RN: Dolan Amen Primary Care Provider: [name] Other Clinician: Jeanine Luz Referring Provider: [name] Treating Provider/Extender: Tito Dine in Treatment: 6 Cardiovascular Pedal pulses are not palpable. Integumentary (Hair, Skin) No erythema around the wound. Notes Wound exam; down nicely in terms of surface area. Still removing very fibrinous surface slough although this comes off much easier and the surface after that looks better. No evidence of surrounding infection Electronic Signature(s) Signed: 09/14/2020 5:03:12 PM By: Linton Ham MD Entered By: Linton Ham on 09/14/2020  11:43:12 Taylor Castaneda ([id number]) -------------------------------------------------------------------------------- Physician Orders Details Patient Name: Taylor Castaneda Date of Service: 09/14/2020 10:45 AM Medical Record Number: [id number] Patient Account Number: [id number] Date of Birth/Sex: 10-Dec-1932 (88 y.o. F) Treating RN: Dolan Amen Primary Care Provider: [name] Other Clinician: Jeanine Luz Referring Provider: [name] Treating Provider/Extender: Tito Dine in Treatment: 6 Verbal / Phone Orders: No Diagnosis Coding Follow-up Appointments o Return Appointment in 2 weeks. Bathing/ Shower/ Hygiene o May shower; gently cleanse wound with antibacterial soap, rinse and pat dry prior to dressing wounds Off-Loading o Other: - Float heels to keep pressure off of them. Additional Orders / Instructions o Follow Nutritious Diet and Increase Protein Intake Medications-Please add to medication list. o Santyl Enzymatic Ointment - If Santyl is not affordable, use Medihoney or Manuka Honey over the counter. Wound Treatment Wound #1 - Calcaneus Wound Laterality: Left Topical: Santyl Collagenase Ointment, 30 (gm), tube Discharge Instructions: apply nickel thick to wound bed only Secondary Dressing: Gauze Discharge Instructions: As directed: dry, moistened with saline or moistened with Dakins Solution Secured With: 14M Medipore H Soft Cloth Surgical Tape, 2x2 (in/yd) Secured With: Kerlix Roll Sterile or Non-Sterile 6-ply 4.5x4 (yd/yd) Discharge Instructions: Apply Kerlix as directed Electronic Signature(s) Signed: 09/14/2020 4:47:42 PM By: Charlett Nose RN Signed: 09/14/2020 5:03:12 PM By: Linton Ham MD Entered By: Georges Mouse, Minus Breeding on 09/14/2020 11:29:11 Taylor Castaneda ([id number]) -------------------------------------------------------------------------------- Problem List Details Patient Name: Taylor Castaneda Date  of Service: 09/14/2020 10:45 AM Medical Record Number: [id number] Patient Account Number: [id number] Date of Birth/Sex: 1932/10/21 (88 y.o. F) Treating RN: Dolan Amen Primary Care Provider: [name] Other Clinician: Jeanine Luz Referring Provider: [name] Treating Provider/Extender: Tito Dine in Treatment: 6 Active Problems ICD-10 Encounter Code Description Active Date MDM Diagnosis E11.621 Type 2 diabetes mellitus with foot ulcer 08/03/2020 No Yes L89.620 Pressure ulcer of left heel, unstageable 08/03/2020 No Yes E11.51 Type 2 diabetes mellitus with diabetic peripheral angiopathy  without 08/03/2020 No Yes gangrene Inactive Problems Resolved Problems Electronic Signature(s) Signed: 09/14/2020 5:03:12 PM By: Linton Ham MD Entered By: Linton Ham on 09/14/2020 11:33:51 Taylor Castaneda, Taylor Castaneda ([id number]) -------------------------------------------------------------------------------- Progress Note Details Patient Name: Taylor Castaneda Date of Service: 09/14/2020 10:45 AM Medical Record Number: [id number] Patient Account Number: [id number] Date of Birth/Sex: 01-14-1933 (88 y.o. F) Treating RN: Dolan Amen Primary Care Provider: [name] Other Clinician: Jeanine Luz Referring Provider: [name] Treating Provider/Extender: Tito Dine in Treatment: 6 Subjective History of Present Illness (HPI) ADMISSION 08/03/2020 This is a 87 year old woman who was in the hospital last month suffering a right CVA with mild residual left-sided symptoms. Sometime during this hospitalization or shortly after she got home they noted that she had an ulcer on her left heel. Saw her primary doctor on 2/25 who correctly pointed out this was unstageable and referred her here. Patient does not complain of any pain. She has home health [[facility]] but we are not exactly sure what they are currently using for wound dressing. Past medical  history includes hypertension, type 2 diabetes, stage IIIb chronic renal failure, recent right CVA is noted, laparoscopic cholecystectomy also last month, gout, seizures ABI in our clinic was 0.71 on the left 08/10/2020 upon evaluation today patient appears to be doing well with regard to her heel ulcer in my opinion. Fortunately there does not appear to be any signs of active infection at this time. No fever chills noted She does have an appointment or rather referral to get an appointment with vascular hopefully we can get that scheduled soon they called and left a message that has not been set up yet though were given the information for them to call the care of that today. 3/23; using Santyl on the pressure ulcer on her left heel. She has had a thorough vascular evaluation. Noncompressible bilaterally however also with monophasic waveforms and low TBI's with a TBI in the right of 0.38 and on the left 0.36. She was seen by vascular. They felt this was a pressure ulcer I think most everybody agrees with this. They also had a long discussion with the patient's son with regards to her PAD. She has diminished waveforms in the posterior tibial artery which is the vessel that perfuses her heel. Nevertheless I think the agreement was a period of watchful waiting to see if Santyl and aggressive wound care can heal this area. The more aggressive approach of course is an angiogram. I talked to the patient about this today with her son they are indeed in favor of a period of watchful waiting to see if wound care can get this area to close. She does not describe claudication with her minimal activity or at rest 3/30; using Santyl on her pressure ulcer on the tip of her heel. She also has PAD but we have been managing this conservatively [see my note above]. 4/6; still using Santyl. We are making improvements in surface area. This was initially unstageable but is down to the muscle layer. 4/13; still using  Santyl. Continued nice improvements in surface area. Also the depth of the wound is improved Objective Constitutional Vitals Time Taken: 10:45 AM, Temperature: 97.9 F, Pulse: 80 bpm, Respiratory Rate: 18 breaths/min, Blood Pressure: 180/80 mmHg. Cardiovascular Pedal pulses are not palpable. General Notes: Wound exam; down nicely in terms of surface area. Still removing very fibrinous surface slough although this comes off much easier and the surface after that looks better. No evidence of surrounding infection  Integumentary (Hair, Skin) No erythema around the wound. Wound #1 status is Open. Original cause of wound was Gradually Appeared. The date acquired was: 06/22/2020. The wound has been in treatment 6 weeks. The wound is located on the Left Calcaneus. The wound measures 1.5cm length x 1.3cm width x 0.2cm depth; 1.532cm^2 area and 0.306cm^3 volume. There is Fat Layer (Subcutaneous Tissue) exposed. There is no tunneling or undermining noted. There is a medium amount of serous drainage noted. There is small (1-33%) red, pink granulation within the wound bed. There is a large (67-100%) amount of necrotic tissue within the wound bed. Taylor Castaneda, Taylor Castaneda ([id number]) Assessment Active Problems ICD-10 Type 2 diabetes mellitus with foot ulcer Pressure ulcer of left heel, unstageable Type 2 diabetes mellitus with diabetic peripheral angiopathy without gangrene Procedures Wound #1 Pre-procedure diagnosis of Wound #1 is a Pressure Ulcer located on the Left Calcaneus . There was a Excisional Skin/Subcutaneous Tissue Debridement with a total area of 1.95 sq cm performed by Ricard Dillon, MD. With the following instrument(s): Curette to remove Viable and Non-Viable tissue/material. Material removed includes Subcutaneous Tissue and Slough and. A time out was conducted at 11:27, prior to the start of the procedure. A Minimum amount of bleeding was controlled with Pressure. The procedure was tolerated  well. Post Debridement Measurements: 1.5cm length x 1.3cm width x 0.3cm depth; 0.459cm^3 volume. Post debridement Stage noted as Unstageable/Unclassified. Character of Wound/Ulcer Post Debridement is stable. Post procedure Diagnosis Wound #1: Same as Pre-Procedure Plan Follow-up Appointments: Return Appointment in 2 weeks. Bathing/ Shower/ Hygiene: May shower; gently cleanse wound with antibacterial soap, rinse and pat dry prior to dressing wounds Off-Loading: Other: - Float heels to keep pressure off of them. Additional Orders / Instructions: Follow Nutritious Diet and Increase Protein Intake Medications-Please add to medication list.: Santyl Enzymatic Ointment - If Santyl is not affordable, use Medihoney or Manuka Honey over the counter. WOUND #1: - Calcaneus Wound Laterality: Left Topical: Santyl Collagenase Ointment, 30 (gm), tube Discharge Instructions: apply nickel thick to wound bed only Secondary Dressing: Gauze Discharge Instructions: As directed: dry, moistened with saline or moistened with Dakins Solution Secured With: 1M Medipore H Soft Cloth Surgical Tape, 2x2 (in/yd) Secured With: Kerlix Roll Sterile or Non-Sterile 6-ply 4.5x4 (yd/yd) Discharge Instructions: Apply Kerlix as directed 1. We continue to make nice improvements each week with the Santyl and I am going to continue this. 2. She has PAD. She did not want and the family did not want aggressive management of this therefore we have not referred her back to vein and vascular. In spite of this the wound is contracting nicely. 3. No evidence of underlying infection Electronic Signature(s) Signed: 09/14/2020 5:03:12 PM By: Linton Ham MD Entered By: Linton Ham on 09/14/2020 11:43:50 Taylor Castaneda, Taylor Castaneda ([id number]) -------------------------------------------------------------------------------- SuperBill Details Patient Name: Taylor Castaneda Date of Service: 09/14/2020 Medical Record Number: [id number] Patient  Account Number: [id number] Date of Birth/Sex: 1932-07-16 (88 y.o. F) Treating RN: Dolan Amen Primary Care Provider: [name] Other Clinician: Jeanine Luz Referring Provider: [name] Treating Provider/Extender: Tito Dine in Treatment: 6 Diagnosis Coding ICD-10 Codes Code Description E11.621 Type 2 diabetes mellitus with foot ulcer L89.620 Pressure ulcer of left heel, unstageable E11.51 Type 2 diabetes mellitus with diabetic peripheral angiopathy without gangrene Facility Procedures CPT4 Code: [id number] Description: 54627 - DEB SUBQ TISSUE 20 SQ CM/< Modifier: Quantity: 1 CPT4 Code: Description: ICD-10 Diagnosis Description L89.620 Pressure ulcer of left heel, unstageable Modifier: Quantity: Physician Procedures CPT4 Code: [id number]  Description: 11042 - WC PHYS SUBQ TISS 20 SQ CM Modifier: Quantity: 1 CPT4 Code: Description: ICD-10 Diagnosis Description L89.620 Pressure ulcer of left heel, unstageable Modifier: Quantity: Electronic Signature(s) Signed: 09/14/2020 5:03:12 PM By: Linton Ham MD Entered By: Linton Ham on 09/14/2020 11:44:02

## 2020-09-15 NOTE — Progress Notes (Signed)
Taylor Castaneda ([id number]) Visit Report for 09/14/2020 Arrival Information Details Patient Name: Taylor Castaneda, Taylor Castaneda. Date of Service: 09/14/2020 10:45 AM Medical Record Number: [id number] Patient Account Number: [id number] Date of Birth/Sex: Dec 23, 1932 (88 y.o. F) Treating Castaneda: Taylor Castaneda Primary Care Taylor Castaneda: Taylor Castaneda Other Clinician: Jeanine Castaneda Referring Taylor Castaneda: Taylor Castaneda Treating Taylor Castaneda/Extender: Taylor Castaneda in Treatment: 6 Visit Information History Since Last Visit Added or deleted any medications: No Patient Arrived: Wheel Chair Had a fall or experienced change in No Arrival Time: 10:47 activities of daily living that may affect Accompanied By: son risk of falls: Transfer Assistance: EasyPivot Patient Lift Hospitalized since last visit: No Patient Identification Verified: Yes Pain Present Now: No Secondary Verification Process Completed: Yes Patient Requires Transmission-Based No Precautions: Patient Has Alerts: Yes Patient Alerts: Patient on Blood Thinner Type I Diabetic Electronic Signature(s) Signed: 09/14/2020 4:32:52 PM By: Taylor Castaneda Entered By: Taylor Castaneda on 09/14/2020 10:48:08 Taylor Castaneda ([id number]) -------------------------------------------------------------------------------- Clinic Level of Care Assessment Details Patient Name: Taylor Castaneda Date of Service: 09/14/2020 10:45 AM Medical Record Number: [id number] Patient Account Number: [id number] Date of Birth/Sex: February 28, 1933 (88 y.o. F) Treating Castaneda: Taylor Castaneda Primary Care Taylor Castaneda: Taylor Castaneda Other Clinician: Jeanine Castaneda Referring Taylor Castaneda: Taylor Castaneda Treating Kedar Sedano/Extender: Taylor Castaneda in Treatment: 6 Clinic Level of Care Assessment Items TOOL 1 Quantity Score []  - Use when EandM and Procedure is performed on INITIAL visit 0 ASSESSMENTS - Nursing Assessment / Reassessment []  - General Physical Exam (combine  w/ comprehensive assessment (listed just below) when performed on new 0 pt. evals) []  - 0 Comprehensive Assessment (HX, ROS, Risk Assessments, Wounds Hx, etc.) ASSESSMENTS - Wound and Skin Assessment / Reassessment []  - Dermatologic / Skin Assessment (not related to wound area) 0 ASSESSMENTS - Ostomy and/or Continence Assessment and Care []  - Incontinence Assessment and Management 0 []  - 0 Ostomy Care Assessment and Management (repouching, etc.) PROCESS - Coordination of Care []  - Simple Patient / Family Education for ongoing care 0 []  - 0 Complex (extensive) Patient / Family Education for ongoing care []  - 0 Staff obtains Programmer, systems, Records, Test Results / Process Orders []  - 0 Staff telephones HHA, Nursing Homes / Clarify orders / etc []  - 0 Routine Transfer to another Facility (non-emergent condition) []  - 0 Routine Hospital Admission (non-emergent condition) []  - 0 New Admissions / Biomedical engineer / Ordering NPWT, Apligraf, etc. []  - 0 Emergency Hospital Admission (emergent condition) PROCESS - Special Needs []  - Pediatric / Minor Patient Management 0 []  - 0 Isolation Patient Management []  - 0 Hearing / Language / Visual special needs []  - 0 Assessment of Community assistance (transportation, D/C planning, etc.) []  - 0 Additional assistance / Altered mentation []  - 0 Support Surface(s) Assessment (bed, cushion, seat, etc.) INTERVENTIONS - Miscellaneous []  - External ear exam 0 []  - 0 Patient Transfer (multiple staff / Civil Service fast streamer / Similar devices) []  - 0 Simple Staple / Suture removal (25 or less) []  - 0 Complex Staple / Suture removal (26 or more) []  - 0 Hypo/Hyperglycemic Management (do not check if billed separately) []  - 0 Ankle / Brachial Index (ABI) - do not check if billed separately Has the patient been seen at the hospital within the last three years: Yes Total Score: 0 Level Of Care: ____ Taylor Castaneda ([id number]) Electronic  Signature(s) Signed: 09/14/2020 4:47:42 PM By: Taylor Castaneda Entered By: Taylor Mouse, Minus Breeding on 09/14/2020 11:29:16 Taylor Castaneda ([id number]) -------------------------------------------------------------------------------- Encounter Discharge  Information Details Patient Name: [name], [name] Date of Service: 09/14/2020 10:45 AM Medical Record Number: [id number] Patient Account Number: [id number] Date of Birth/Sex: 10/29/1932 (88 y.o. F) Treating Castaneda: Donnamarie Poag Primary Care Taylor Castaneda: Taylor Castaneda Other Clinician: Jeanine Castaneda Referring Taylor Castaneda: Taylor Castaneda Treating Taylor Castaneda/Extender: Taylor Castaneda in Treatment: 6 Encounter Discharge Information Items Post Procedure Vitals Discharge Condition: Stable Temperature (F): 97.9 Ambulatory Status: Wheelchair Pulse (bpm): 79 Discharge Destination: Home Respiratory Rate (breaths/min): 18 Transportation: Private Auto Blood Pressure (mmHg): 180/80 Accompanied By: son Schedule Follow-up Appointment: Yes Clinical Summary of Care: Electronic Signature(s) Signed: 09/15/2020 7:52:35 AM By: Donnamarie Poag Entered By: Donnamarie Poag on 09/14/2020 11:37:17 Taylor Castaneda ([id number]) -------------------------------------------------------------------------------- Lower Extremity Assessment Details Patient Name: Taylor Castaneda Date of Service: 09/14/2020 10:45 AM Medical Record Number: [id number] Patient Account Number: [id number] Date of Birth/Sex: 1932-08-02 (88 y.o. F) Treating Castaneda: Taylor Castaneda Primary Care Cassiopeia Florentino: Taylor Castaneda Other Clinician: Jeanine Castaneda Referring Lorris Carducci: Taylor Castaneda Treating Zonie Crutcher/Extender: Taylor Castaneda in Treatment: 6 Electronic Signature(s) Signed: 09/14/2020 4:32:52 PM By: Taylor Castaneda Signed: 09/14/2020 4:47:42 PM By: Taylor Castaneda Entered By: Taylor Castaneda on 09/14/2020 10:59:45 Taylor Castaneda  ([id number]) -------------------------------------------------------------------------------- Multi Wound Chart Details Patient Name: Taylor Castaneda Date of Service: 09/14/2020 10:45 AM Medical Record Number: [id number] Patient Account Number: [id number] Date of Birth/Sex: 04/16/1933 (88 y.o. F) Treating Castaneda: Taylor Castaneda Primary Care Jabrea Kallstrom: Taylor Castaneda Other Clinician: Jeanine Castaneda Referring Earsie Humm: Taylor Castaneda Treating Gennie Eisinger/Extender: Taylor Castaneda in Treatment: 6 Vital Signs Height(in): Pulse(bpm): 60 Weight(lbs): Blood Pressure(mmHg): 180/80 Body Mass Index(BMI): Temperature(F): 97.9 Respiratory Rate(breaths/min): 18 Photos: [N/A:N/A] Wound Location: Left Calcaneus N/A N/A Wounding Event: Gradually Appeared N/A N/A Primary Etiology: Pressure Ulcer N/A N/A Comorbid History: Cataracts, Hypertension, Myocardial N/A N/A Infarction, Peripheral Arterial Disease, Type I Diabetes, End Stage Renal Disease, Gout, Seizure Disorder Date Acquired: 06/22/2020 N/A N/A Weeks of Treatment: 6 N/A N/A Wound Status: Open N/A N/A Measurements L x W x D (cm) 1.5x1.3x0.2 N/A N/A Area (cm) : 1.532 N/A N/A Volume (cm) : 0.306 N/A N/A % Reduction in Area: 71.10% N/A N/A % Reduction in Volume: 42.30% N/A N/A Classification: Unstageable/Unclassified N/A N/A Exudate Amount: Medium N/A N/A Exudate Type: Serous N/A N/A Exudate Color: amber N/A N/A Granulation Amount: Small (1-33%) N/A N/A Granulation Quality: Red, Pink N/A N/A Necrotic Amount: Large (67-100%) N/A N/A Exposed Structures: Fat Layer (Subcutaneous Tissue): N/A N/A Yes Fascia: No Tendon: No Muscle: No Joint: No Bone: No Epithelialization: None N/A N/A Debridement: Debridement - Excisional N/A N/A Pre-procedure Verification/Time 11:27 N/A N/A Out Taken: Tissue Debrided: Subcutaneous, Slough N/A N/A Level: Skin/Subcutaneous Tissue N/A N/A Debridement Area (sq cm): 1.95 N/A N/A Instrument:  Curette N/A N/A Bleeding: Minimum N/A N/A Hemostasis Achieved: Pressure N/A N/A Debridement Treatment Procedure was tolerated well N/A N/A Response: 1.5x1.3x0.3 N/A N/A Taylor Castaneda, Taylor Castaneda ([id number]) Post Debridement Measurements L x W x D (cm) Post Debridement Volume: 0.459 N/A N/A (cm) Post Debridement Stage: Unstageable/Unclassified N/A N/A Procedures Performed: Debridement N/A N/A Treatment Notes Wound #1 (Calcaneus) Wound Laterality: Left Cleanser Peri-Wound Care Topical Santyl Collagenase Ointment, 30 (gm), tube Discharge Instruction: apply nickel thick to wound bed only Primary Dressing Secondary Dressing Gauze Discharge Instruction: As directed: dry, moistened with saline or moistened with Dakins Solution Secured With 42M Medipore H Soft Cloth Surgical Tape, 2x2 (in/yd) Kerlix Roll Sterile or Non-Sterile 6-ply 4.5x4 (yd/yd) Discharge Instruction: Apply Kerlix as directed Compression Wrap Compression Stockings Add-Ons Electronic Signature(s) Signed: 09/14/2020 5:03:12  PM By: Linton Ham MD Entered By: Linton Ham on 09/14/2020 11:37:01 Taylor Castaneda ([id number]) -------------------------------------------------------------------------------- [place] Details Patient Name: Taylor Castaneda, Taylor Castaneda Date of Service: 09/14/2020 10:45 AM Medical Record Number: [id number] Patient Account Number: [id number] Date of Birth/Sex: 04-28-1933 (88 y.o. F) Treating Castaneda: Taylor Castaneda Primary Care Sondos Wolfman: Taylor Castaneda Other Clinician: Jeanine Castaneda Referring Floraine Buechler: Taylor Castaneda Treating Braxtyn Dorff/Extender: Taylor Castaneda in Treatment: 6 Active Inactive Medication Nursing Diagnoses: Knowledge deficit related to medication safety: actual or potential Goals: Patient/caregiver will demonstrate understanding of all current medications Date Initiated: 08/03/2020 Target Resolution Date: 08/17/2020 Goal Status: Active Patient/caregiver will  demonstrate understanding of new oral/IV medications prescribed at the [facility] (topical prescriptions are covered under the skin breakdown problem) Date Initiated: 08/03/2020 Target Resolution Date: 08/17/2020 Goal Status: Active Interventions: Assess for medication contraindications each visit where new medications are prescribed Assess patient/caregiver ability to manage medication regimen upon admission and as needed Patient/Caregiver given reconciled medication list upon admission, changes in medications and discharge from the [facility] education on medication safety Treatment Activities: New medication prescribed at [facility] : 08/03/2020 Notes: Necrotic Tissue Nursing Diagnoses: Impaired tissue integrity related to necrotic/devitalized tissue Knowledge deficit related to management of necrotic/devitalized tissue Goals: Necrotic/devitalized tissue will be minimized in the wound bed Date Initiated: 08/03/2020 Target Resolution Date: 08/17/2020 Goal Status: Active Patient/caregiver will verbalize understanding of reason and process for debridement of necrotic tissue Date Initiated: 08/03/2020 Target Resolution Date: 08/17/2020 Goal Status: Active Interventions: Assess patient pain level pre-, during and post procedure and prior to discharge Provide education on necrotic tissue and debridement process Treatment Activities: Apply topical anesthetic as ordered : 08/03/2020 Excisional debridement : 08/03/2020 Notes: Wound/Skin Impairment Nursing Diagnoses: Impaired tissue integrity Knowledge deficit related to smoking impact on wound healing Taylor Castaneda, Taylor Castaneda ([id number]) Knowledge deficit related to ulceration/compromised skin integrity Goals: Patient/caregiver will verbalize understanding of skin care regimen Date Initiated: 08/03/2020 Target Resolution Date: 08/17/2020 Goal Status: Active Ulcer/skin breakdown will have a volume reduction of 30% by week 4 Date Initiated:  08/03/2020 Target Resolution Date: 09/03/2020 Goal Status: Active Interventions: Assess patient/caregiver ability to obtain necessary supplies Assess patient/caregiver ability to perform ulcer/skin care regimen upon admission and as needed Assess ulceration(s) every visit Treatment Activities: Patient referred to home care : 08/03/2020 Skin care regimen initiated : 08/03/2020 Topical wound management initiated : 08/03/2020 Notes: Electronic Signature(s) Signed: 09/14/2020 4:47:42 PM By: Taylor Castaneda Entered By: Taylor Mouse, Minus Breeding on 09/14/2020 11:27:00 Taylor Castaneda ([id number]) -------------------------------------------------------------------------------- Pain Assessment Details Patient Name: Taylor Castaneda Date of Service: 09/14/2020 10:45 AM Medical Record Number: [id number] Patient Account Number: [id number] Date of Birth/Sex: 1932-06-08 (88 y.o. F) Treating Castaneda: Taylor Castaneda Primary Care Juron Vorhees: Taylor Castaneda Other Clinician: Jeanine Castaneda Referring Pedram Goodchild: Taylor Castaneda Treating Khia Dieterich/Extender: Taylor Castaneda in Treatment: 6 Active Problems Location of Pain Severity and Description of Pain Patient Has Paino No Site Locations Rate the pain. Current Pain Level: 0 Pain Management and Medication Current Pain Management: Electronic Signature(s) Signed: 09/14/2020 4:32:52 PM By: Taylor Castaneda Signed: 09/14/2020 4:47:42 PM By: Taylor Castaneda Entered By: Taylor Castaneda on 09/14/2020 10:50:33 Taylor Castaneda ([id number]) -------------------------------------------------------------------------------- Patient/Caregiver Education Details Patient Name: Taylor Castaneda Date of Service: 09/14/2020 10:45 AM Medical Record Number: [id number] Patient Account Number: [id number] Date of Birth/Gender: September 30, 1932 (88 y.o. F) Treating Castaneda: Taylor Castaneda Primary Care Physician: Taylor Castaneda Other Clinician: Jeanine Castaneda Referring  Physician: Ria Castaneda Treating Physician/Extender: Taylor Castaneda  in Treatment: 6 Education Assessment Education Provided To: Patient Education Topics Provided Wound/Skin Impairment: Methods: Explain/Verbal Responses: State content correctly Electronic Signature(s) Signed: 09/14/2020 4:47:42 PM By: Taylor Castaneda Entered By: Taylor Mouse, Minus Breeding on 09/14/2020 11:29:31 Taylor Castaneda ([id number]) -------------------------------------------------------------------------------- Wound Assessment Details Patient Name: Taylor Castaneda Date of Service: 09/14/2020 10:45 AM Medical Record Number: [id number] Patient Account Number: [id number] Date of Birth/Sex: 12/11/1932 (88 y.o. F) Treating Castaneda: Taylor Castaneda Primary Care Chavonne Sforza: Taylor Castaneda Other Clinician: Jeanine Castaneda Referring Etheleen Valtierra: Taylor Castaneda Treating Kalenna Millett/Extender: Taylor Castaneda in Treatment: 6 Wound Status Wound Number: 1 Primary Pressure Ulcer Etiology: Wound Location: Left Calcaneus Wound Open Wounding Event: Gradually Appeared Status: Date Acquired: 06/22/2020 Comorbid Cataracts, Hypertension, Myocardial Infarction, Peripheral Weeks Of Treatment: 6 History: Arterial Disease, Type I Diabetes, End Stage Renal Clustered Wound: No Disease, Gout, Seizure Disorder Photos Wound Measurements Length: (cm) 1.5 % Red Width: (cm) 1.3 % Red Depth: (cm) 0.2 Epith Area: (cm) 1.532 Tunn Volume: (cm) 0.306 Unde uction in Area: 71.1% uction in Volume: 42.3% elialization: None eling: No rmining: No Wound Description Classification: Unstageable/Unclassified Foul Exudate Amount: Medium Slou Exudate Type: Serous Exudate Color: amber Odor After Cleansing: No gh/Fibrino Yes Wound Bed Granulation Amount: Small (1-33%) Exposed Structure Granulation Quality: Red, Pink Fascia Exposed: No Necrotic Amount: Large (67-100%) Fat Layer (Subcutaneous Tissue) Exposed:  Yes Tendon Exposed: No Muscle Exposed: No Joint Exposed: No Bone Exposed: No Treatment Notes Wound #1 (Calcaneus) Wound Laterality: Left Cleanser Peri-Wound Care Topical Santyl Collagenase Ointment, 30 (gm), tube Taylor Castaneda, Taylor Castaneda ([id number]) Discharge Instruction: apply nickel thick to wound bed only Primary Dressing Secondary Dressing Gauze Discharge Instruction: As directed: dry, moistened with saline or moistened with Dakins Solution Secured With 18M Medipore H Soft Cloth Surgical Tape, 2x2 (in/yd) Kerlix Roll Sterile or Non-Sterile 6-ply 4.5x4 (yd/yd) Discharge Instruction: Apply Kerlix as directed Compression Wrap Compression Stockings Add-Ons Electronic Signature(s) Signed: 09/14/2020 4:32:52 PM By: Taylor Castaneda Signed: 09/14/2020 4:47:42 PM By: Taylor Castaneda Entered By: Taylor Castaneda on 09/14/2020 10:59:23 Taylor Castaneda ([id number]) -------------------------------------------------------------------------------- Vitals Details Patient Name: Taylor Castaneda Date of Service: 09/14/2020 10:45 AM Medical Record Number: [id number] Patient Account Number: [id number] Date of Birth/Sex: [birth year] (88 y.o. F) Treating Castaneda: Taylor Castaneda Primary Care Halaina Vanduzer: Taylor Castaneda Other Clinician: Jeanine Castaneda Referring Aziel Morgan: Taylor Castaneda Treating Thersia Petraglia/Extender: Taylor Castaneda in Treatment: 6 Vital Signs Time Taken: 10:45 Temperature (F): 97.9 Pulse (bpm): 80 Respiratory Rate (breaths/min): 18 Blood Pressure (mmHg): 180/80 Reference Range: 80 - 120 mg / dl Electronic Signature(s) Signed: 09/14/2020 4:32:52 PM By: Taylor Castaneda Entered By: Taylor Castaneda on 09/14/2020 10:50:15

## 2020-09-21 ENCOUNTER — Ambulatory Visit: Payer: Medicare Other | Admitting: Family Medicine

## 2020-09-28 ENCOUNTER — Other Ambulatory Visit: Payer: Self-pay

## 2020-09-28 ENCOUNTER — Encounter: Payer: Medicare Other | Admitting: Internal Medicine

## 2020-09-28 DIAGNOSIS — L89620 Pressure ulcer of left heel, unstageable: Secondary | ICD-10-CM | POA: Diagnosis not present

## 2020-09-28 DIAGNOSIS — E10621 Type 1 diabetes mellitus with foot ulcer: Secondary | ICD-10-CM | POA: Diagnosis not present

## 2020-09-28 NOTE — Progress Notes (Signed)
Taylor, Castaneda ([id number]) Visit Report for 09/28/2020 Debridement Details Patient Name: Taylor Castaneda, Taylor Castaneda. Date of Service: 09/28/2020 2:30 PM Medical Record Number: [id number] Patient Account Number: [id number] Date of Birth/Sex: 10-19-1932 (88 y.o. F) Treating RN: Cornell Barman Primary Care Provider: [name] Other Clinician: Jeanine Luz Referring Provider: [name] Treating Provider/Extender: Tito Dine in Treatment: 8 Debridement Performed for Wound #1 Left Calcaneus Assessment: Performed By: Physician Ricard Dillon, MD Debridement Type: Chemical/Enzymatic/Mechanical Agent Used: Santyl Level of Consciousness (Pre- Awake and Alert procedure): Pre-procedure Verification/Time Out Yes - 14:54 Taken: Instrument: Other : tongue blade Bleeding: None Response to Treatment: Procedure was tolerated well Level of Consciousness (Post- Awake and Alert procedure): Post Debridement Measurements of Total Wound Length: (cm) 0.5 Stage: Unstageable/Unclassified Width: (cm) 1 Depth: (cm) 0.2 Volume: (cm) 0.079 Character of Wound/Ulcer Post Debridement: Stable Post Procedure Diagnosis Same as Pre-procedure Electronic Signature(s) Signed: 09/28/2020 4:08:37 PM By: Gretta Cool, BSN, RN, CWS, Kim RN, BSN Signed: 09/28/2020 5:04:28 PM By: Linton Ham MD Entered By: Gretta Cool, BSN, RN, CWS, Kim on 09/28/2020 14:54:52 Taylor Castaneda ([id number]) -------------------------------------------------------------------------------- HPI Details Patient Name: Taylor Castaneda Date of Service: 09/28/2020 2:30 PM Medical Record Number: [id number] Patient Account Number: [id number] Date of Birth/Sex: 1932/07/03 (88 y.o. F) Treating RN: Cornell Barman Primary Care Provider: [name] Other Clinician: Jeanine Luz Referring Provider: [name] Treating Provider/Extender: Tito Dine in Treatment: 8 History of Present Illness HPI Description:  ADMISSION 08/03/2020 This is a 87 year old woman who was in the hospital last month suffering a right CVA with mild residual left-sided symptoms. Sometime during this hospitalization or shortly after she got home they noted that she had an ulcer on her left heel. Saw her primary doctor on 2/25 who correctly pointed out this was unstageable and referred her here. Patient does not complain of any pain. She has home health [[facility]] but we are not exactly sure what they are currently using for wound dressing. Past medical history includes hypertension, type 2 diabetes, stage IIIb chronic renal failure, recent right CVA is noted, laparoscopic cholecystectomy also last month, gout, seizures ABI in our clinic was 0.71 on the left 08/10/2020 upon evaluation today patient appears to be doing well with regard to her heel ulcer in my opinion. Fortunately there does not appear to be any signs of active infection at this time. No fever chills noted She does have an appointment or rather referral to get an appointment with vascular hopefully we can get that scheduled soon they called and left a message that has not been set up yet though were given the information for them to call the care of that today. 3/23; using Santyl on the pressure ulcer on her left heel. She has had a thorough vascular evaluation. Noncompressible bilaterally however also with monophasic waveforms and low TBI's with a TBI in the right of 0.38 and on the left 0.36. She was seen by vascular. They felt this was a pressure ulcer I think most everybody agrees with this. They also had a long discussion with the patient's son with regards to her PAD. She has diminished waveforms in the posterior tibial artery which is the vessel that perfuses her heel. Nevertheless I think the agreement was a period of watchful waiting to see if Santyl and aggressive wound care can heal this area. The more aggressive approach of course is an angiogram. I talked to  the patient about this today with her son they are indeed in favor of a  period of watchful waiting to see if wound care can get this area to close. She does not describe claudication with her minimal activity or at rest 3/30; using Santyl on her pressure ulcer on the tip of her heel. She also has PAD but we have been managing this conservatively [see my note above]. 4/6; still using Santyl. We are making improvements in surface area. This was initially unstageable but is down to the muscle layer. 4/13; still using Santyl. Continued nice improvements in surface area. Also the depth of the wound is improved 4/27; still using Santyl. Continues with dramatic improvement in surface area the depth of the wound also improved. No evidence of surrounding infection Electronic Signature(s) Signed: 09/28/2020 5:04:28 PM By: Linton Ham MD Entered By: Linton Ham on 09/28/2020 15:00:31 Taylor Castaneda ([id number]) -------------------------------------------------------------------------------- Physical Exam Details Patient Name: Taylor Castaneda Date of Service: 09/28/2020 2:30 PM Medical Record Number: [id number] Patient Account Number: [id number] Date of Birth/Sex: 17-Aug-1932 (88 y.o. F) Treating RN: Cornell Barman Primary Care Provider: [name] Other Clinician: Jeanine Luz Referring Provider: [name] Treating Provider/Extender: Tito Dine in Treatment: 8 Constitutional Sitting or standing Blood Pressure is within target range for patient.. Pulse regular and within target range for patient.Marland Kitchen Respirations regular, non- labored and within target range.. Temperature is normal and within the target range for the patient.Marland Kitchen appears in no distress. Cardiovascular Dorsalis pedis pulses palpable posterior tibial was not.. Notes Wound exam; continued improvement in the surface area. Under illumination healthy looking small wound with minimal depth. There is no evidence  of surrounding infection Electronic Signature(s) Signed: 09/28/2020 5:04:28 PM By: Linton Ham MD Entered By: Linton Ham on 09/28/2020 15:53:16 Taylor Castaneda, Taylor Castaneda ([id number]) -------------------------------------------------------------------------------- Physician Orders Details Patient Name: Taylor Castaneda Date of Service: 09/28/2020 2:30 PM Medical Record Number: [id number] Patient Account Number: [id number] Date of Birth/Sex: Jun 07, 1932 (88 y.o. F) Treating RN: Cornell Barman Primary Care Provider: [name] Other Clinician: Jeanine Luz Referring Provider: [name] Treating Provider/Extender: Tito Dine in Treatment: 8 Verbal / Phone Orders: No Diagnosis Coding Follow-up Appointments o Return Appointment in 2 weeks. Bathing/ Shower/ Hygiene o May shower; gently cleanse wound with antibacterial soap, rinse and pat dry prior to dressing wounds Off-Loading o Other: - Float heels to keep pressure off of them. Additional Orders / Instructions o Follow Nutritious Diet and Increase Protein Intake Medications-Please add to medication list. o Santyl Enzymatic Ointment - If Santyl is not affordable, use Medihoney or Manuka Honey over the counter. Wound Treatment Wound #1 - Calcaneus Wound Laterality: Left Topical: Santyl Collagenase Ointment, 30 (gm), tube Discharge Instructions: apply nickel thick to wound bed only Secondary Dressing: Gauze Discharge Instructions: As directed: dry, moistened with saline or moistened with Dakins Solution Secured With: 49M Medipore H Soft Cloth Surgical Tape, 2x2 (in/yd) Secured With: Kerlix Roll Sterile or Non-Sterile 6-ply 4.5x4 (yd/yd) Discharge Instructions: Apply Kerlix as directed Engineer, maintenance) Signed: 09/28/2020 4:08:37 PM By: Gretta Cool, BSN, RN, CWS, Kim RN, BSN Signed: 09/28/2020 5:04:28 PM By: Linton Ham MD Entered By: Gretta Cool, BSN, RN, CWS, Kim on 09/28/2020 14:55:22 Taylor Castaneda  ([id number]) -------------------------------------------------------------------------------- Problem List Details Patient Name: Taylor Castaneda, Taylor Castaneda. Date of Service: 09/28/2020 2:30 PM Medical Record Number: [id number] Patient Account Number: [id number] Date of Birth/Sex: [birth year] (88 y.o. F) Treating RN: Cornell Barman Primary Care Provider: [name] Other Clinician: Jeanine Luz Referring Provider: [name] Treating Provider/Extender: Tito Dine in Treatment: 8 Active Problems ICD-10 Encounter Code Description Active  Date MDM Diagnosis E11.621 Type 2 diabetes mellitus with foot ulcer 08/03/2020 No Yes L89.620 Pressure ulcer of left heel, unstageable 08/03/2020 No Yes E11.51 Type 2 diabetes mellitus with diabetic peripheral angiopathy without 08/03/2020 No Yes gangrene Inactive Problems Resolved Problems Electronic Signature(s) Signed: 09/28/2020 5:04:28 PM By: Linton Ham MD Entered By: Linton Ham on 09/28/2020 14:59:43 Taylor Castaneda, Taylor Castaneda ([id number]) -------------------------------------------------------------------------------- Progress Note Details Patient Name: Taylor Castaneda Date of Service: 09/28/2020 2:30 PM Medical Record Number: [id number] Patient Account Number: [id number] Date of Birth/Sex: 1933-06-01 (88 y.o. F) Treating RN: Cornell Barman Primary Care Provider: [name] Other Clinician: Jeanine Luz Referring Provider: [name] Treating Provider/Extender: Tito Dine in Treatment: 8 Subjective History of Present Illness (HPI) ADMISSION 08/03/2020 This is a 87 year old woman who was in the hospital last month suffering a right CVA with mild residual left-sided symptoms. Sometime during this hospitalization or shortly after she got home they noted that she had an ulcer on her left heel. Saw her primary doctor on 2/25 who correctly pointed out this was unstageable and referred her here. Patient does not  complain of any pain. She has home health [[facility]] but we are not exactly sure what they are currently using for wound dressing. Past medical history includes hypertension, type 2 diabetes, stage IIIb chronic renal failure, recent right CVA is noted, laparoscopic cholecystectomy also last month, gout, seizures ABI in our clinic was 0.71 on the left 08/10/2020 upon evaluation today patient appears to be doing well with regard to her heel ulcer in my opinion. Fortunately there does not appear to be any signs of active infection at this time. No fever chills noted She does have an appointment or rather referral to get an appointment with vascular hopefully we can get that scheduled soon they called and left a message that has not been set up yet though were given the information for them to call the care of that today. 3/23; using Santyl on the pressure ulcer on her left heel. She has had a thorough vascular evaluation. Noncompressible bilaterally however also with monophasic waveforms and low TBI's with a TBI in the right of 0.38 and on the left 0.36. She was seen by vascular. They felt this was a pressure ulcer I think most everybody agrees with this. They also had a long discussion with the patient's son with regards to her PAD. She has diminished waveforms in the posterior tibial artery which is the vessel that perfuses her heel. Nevertheless I think the agreement was a period of watchful waiting to see if Santyl and aggressive wound care can heal this area. The more aggressive approach of course is an angiogram. I talked to the patient about this today with her son they are indeed in favor of a period of watchful waiting to see if wound care can get this area to close. She does not describe claudication with her minimal activity or at rest 3/30; using Santyl on her pressure ulcer on the tip of her heel. She also has PAD but we have been managing this conservatively [see my note above]. 4/6; still  using Santyl. We are making improvements in surface area. This was initially unstageable but is down to the muscle layer. 4/13; still using Santyl. Continued nice improvements in surface area. Also the depth of the wound is improved 4/27; still using Santyl. Continues with dramatic improvement in surface area the depth of the wound also improved. No evidence of surrounding infection Objective Constitutional Sitting or standing Blood  Pressure is within target range for patient.. Pulse regular and within target range for patient.Marland Kitchen Respirations regular, non- labored and within target range.. Temperature is normal and within the target range for the patient.Marland Kitchen appears in no distress. Vitals Time Taken: 2:33 PM, Temperature: 97.7 F, Pulse: 85 bpm, Respiratory Rate: 18 breaths/min, Blood Pressure: 188/78 mmHg. Cardiovascular Dorsalis pedis pulses palpable posterior tibial was not.. General Notes: Wound exam; continued improvement in the surface area. Under illumination healthy looking small wound with minimal depth. There is no evidence of surrounding infection Integumentary (Hair, Skin) Wound #1 status is Open. Original cause of wound was Gradually Appeared. The date acquired was: 06/22/2020. The wound has been in treatment 8 weeks. The wound is located on the Left Calcaneus. The wound measures 0.5cm length x 1cm width x 0.2cm depth; 0.393cm^2 area and 0.079cm^3 volume. There is Fat Layer (Subcutaneous Tissue) exposed. There is no tunneling or undermining noted. There is a medium amount of serous drainage noted. There is small (1-33%) red, pink granulation within the wound bed. There is a large (67-100%) amount of necrotic tissue within the wound bed including Adherent Slough. Taylor Castaneda, Taylor Castaneda ([id number]) Assessment Active Problems ICD-10 Type 2 diabetes mellitus with foot ulcer Pressure ulcer of left heel, unstageable Type 2 diabetes mellitus with diabetic peripheral angiopathy without  gangrene Procedures Wound #1 Pre-procedure diagnosis of Wound #1 is a Pressure Ulcer located on the Left Calcaneus . There was a Chemical/Enzymatic/Mechanical debridement performed by Ricard Dillon, MD. With the following instrument(s): tongue blade. Agent used was [employer]. A time out was conducted at 14:54, prior to the start of the procedure. There was no bleeding. The procedure was tolerated well. Post Debridement Measurements: 0.5cm length x 1cm width x 0.2cm depth; 0.079cm^3 volume. Post debridement Stage noted as Unstageable/Unclassified. Character of Wound/Ulcer Post Debridement is stable. Post procedure Diagnosis Wound #1: Same as Pre-Procedure Plan Follow-up Appointments: Return Appointment in 2 weeks. Bathing/ Shower/ Hygiene: May shower; gently cleanse wound with antibacterial soap, rinse and pat dry prior to dressing wounds Off-Loading: Other: - Float heels to keep pressure off of them. Additional Orders / Instructions: Follow Nutritious Diet and Increase Protein Intake Medications-Please add to medication list.: Santyl Enzymatic Ointment - If Santyl is not affordable, use Medihoney or Manuka Honey over the counter. WOUND #1: - Calcaneus Wound Laterality: Left Topical: Santyl Collagenase Ointment, 30 (gm), tube Discharge Instructions: apply nickel thick to wound bed only Secondary Dressing: Gauze Discharge Instructions: As directed: dry, moistened with saline or moistened with Dakins Solution Secured With: 44M Medipore H Soft Cloth Surgical Tape, 2x2 (in/yd) Secured With: Kerlix Roll Sterile or Non-Sterile 6-ply 4.5x4 (yd/yd) Discharge Instructions: Apply Kerlix as directed #1 at this point I have continued with Santyl. She appears to be doing so well and dimensions are so much better that it did not make sense to change the actual dressing 2. No evidence of surrounding infection Electronic Signature(s) Signed: 09/28/2020 5:04:28 PM By: Linton Ham MD Entered By:  Linton Ham on 09/28/2020 15:53:50 Taylor Castaneda, Taylor Castaneda ([id number]) -------------------------------------------------------------------------------- SuperBill Details Patient Name: Taylor Castaneda Date of Service: 09/28/2020 Medical Record Number: [id number] Patient Account Number: [id number] Date of Birth/Sex: 1932-08-08 (88 y.o. F) Treating RN: Cornell Barman Primary Care Provider: [name] Other Clinician: Jeanine Luz Referring Provider: [name] Treating Provider/Extender: Tito Dine in Treatment: 8 Diagnosis Coding ICD-10 Codes Code Description E11.621 Type 2 diabetes mellitus with foot ulcer L89.620 Pressure ulcer of left heel, unstageable E11.51 Type 2 diabetes mellitus with  diabetic peripheral angiopathy without gangrene Facility Procedures CPT4 Code: [id number] Description: [phone] - DEBRIDE W/O ANES NON SELECT Modifier: Quantity: 1 Physician Procedures CPT4 Code: [id number] Description: [id number] - WC PHYS LEVEL 3 - EST PT Modifier: Quantity: 1 CPT4 Code: Description: ICD-10 Diagnosis Description E11.621 Type 2 diabetes mellitus with foot ulcer L89.620 Pressure ulcer of left heel, unstageable E11.51 Type 2 diabetes mellitus with diabetic peripheral angiopathy without Modifier: gangrene Quantity: Electronic Signature(s) Signed: 09/28/2020 5:04:28 PM By: Linton Ham MD Entered By: Linton Ham on 09/28/2020 15:54:12

## 2020-09-28 NOTE — Progress Notes (Signed)
Taylor Castaneda, Taylor Castaneda ([id number]) Visit Report for 09/28/2020 Arrival Information Details Patient Name: Taylor Castaneda, Taylor Castaneda. Date of Service: 09/28/2020 2:30 PM Medical Record Number: [id number] Patient Account Number: [id number] Date of Birth/Sex: 1933-01-18 (88 y.o. F) Treating RN: Taylor Castaneda Primary Care Taylor Castaneda: Taylor Castaneda Other Clinician: Jeanine Castaneda Referring Taylor Castaneda: Taylor Castaneda Treating Taylor Castaneda/Extender: Taylor Castaneda in Treatment: 8 Visit Information History Since Last Visit Added or deleted any medications: No Patient Arrived: Wheel Chair Had a fall or experienced change in No Arrival Time: 14:29 activities of daily living that may affect Accompanied By: son risk of falls: Transfer Assistance: EasyPivot Patient Lift Hospitalized since last visit: No Patient Identification Verified: Yes Has Dressing in Place as Prescribed: Yes Patient Requires Transmission-Based No Pain Present Now: No Precautions: Patient Has Alerts: Yes Patient Alerts: Patient on Blood Thinner Type I Diabetic Electronic Signature(s) Signed: 09/28/2020 4:46:55 PM By: Taylor Castaneda Entered By: Taylor Castaneda on 09/28/2020 14:31:08 Taylor Castaneda ([id number]) -------------------------------------------------------------------------------- Encounter Discharge Information Details Patient Name: Taylor Castaneda Date of Service: 09/28/2020 2:30 PM Medical Record Number: [id number] Patient Account Number: [id number] Date of Birth/Sex: 1932/07/26 (88 y.o. F) Treating RN: Taylor Castaneda Primary Care Taylor Castaneda: Taylor Castaneda Other Clinician: Jeanine Castaneda Referring Calistro Rauf: Taylor Castaneda Treating Taylor Castaneda/Extender: Taylor Castaneda in Treatment: 8 Encounter Discharge Information Items Post Procedure Vitals Discharge Condition: Stable Temperature (F): 97.7 Ambulatory Status: Ambulatory Pulse (bpm): 85 Discharge Destination: Home Respiratory Rate (breaths/min):  18 Transportation: Other Blood Pressure (mmHg): 188/78 Accompanied By: son Schedule Follow-up Appointment: Yes Clinical Summary of Care: Electronic Signature(s) Signed: 09/28/2020 4:53:53 PM By: Taylor Castaneda Entered By: Taylor Castaneda on 09/28/2020 15:06:00 Taylor Castaneda ([id number]) -------------------------------------------------------------------------------- Lower Extremity Assessment Details Patient Name: Taylor Castaneda Date of Service: 09/28/2020 2:30 PM Medical Record Number: [id number] Patient Account Number: [id number] Date of Birth/Sex: 01-27-1933 (88 y.o. F) Treating RN: Taylor Castaneda Primary Care Taylor Castaneda: Taylor Castaneda Other Clinician: Jeanine Castaneda Referring Taylor Castaneda: Taylor Castaneda Treating Leighanna Kirn/Extender: Taylor Castaneda Weeks in Treatment: 8 Edema Assessment Assessed: [Left: Yes] [Right: No] Edema: [Left: N] [Right: o] Vascular Assessment Pulses: Dorsalis Pedis Palpable: [Left:Yes] Electronic Signature(s) Signed: 09/28/2020 4:46:55 PM By: Taylor Castaneda Entered By: Taylor Castaneda on 09/28/2020 14:37:49 Castaneda, Taylor Friar ([id number]) -------------------------------------------------------------------------------- Multi Wound Chart Details Patient Name: Taylor Castaneda Date of Service: 09/28/2020 2:30 PM Medical Record Number: [id number] Patient Account Number: [id number] Date of Birth/Sex: [birth year] (88 y.o. F) Treating RN: Taylor Castaneda Primary Care Jaslen Adcox: Taylor Castaneda Other Clinician: Jeanine Castaneda Referring Taylor Castaneda: Taylor Castaneda Treating Damyen Knoll/Extender: Taylor Castaneda in Treatment: 8 Vital Signs Height(in): Pulse(bpm): 81 Weight(lbs): Blood Pressure(mmHg): 188/78 Body Mass Index(BMI): Temperature(F): 97.7 Respiratory Rate(breaths/min): 18 Photos: [N/A:N/A] Wound Location: Left Calcaneus N/A N/A Wounding Event: Gradually Appeared N/A N/A Primary Etiology: Pressure Ulcer N/A N/A Comorbid History: Cataracts,  Hypertension, Myocardial N/A N/A Infarction, Peripheral Arterial Disease, Type I Diabetes, End Stage Renal Disease, Gout, Seizure Disorder Date Acquired: 06/22/2020 N/A N/A Weeks of Treatment: 8 N/A N/A Wound Status: Open N/A N/A Measurements L x W x D (cm) 0.5x1x0.2 N/A N/A Area (cm) : 0.393 N/A N/A Volume (cm) : 0.079 N/A N/A % Reduction in Area: 92.60% N/A N/A % Reduction in Volume: 85.10% N/A N/A Classification: Unstageable/Unclassified N/A N/A Exudate Amount: Medium N/A N/A Exudate Type: Serous N/A N/A Exudate Color: amber N/A N/A Granulation Amount: Small (1-33%) N/A N/A Granulation Quality: Red, Pink N/A N/A Necrotic Amount: Large (67-100%) N/A N/A Exposed Structures: Fat Layer (Subcutaneous Tissue): N/A N/A Yes Fascia: No Tendon:  No Muscle: No Joint: No Bone: No Epithelialization: Small (1-33%) N/A N/A Debridement: Chemical/Enzymatic/Mechanical N/A N/A Pre-procedure Verification/Time 14:54 N/A N/A Out Taken: Instrument: Other(tongue blade) N/A N/A Bleeding: None N/A N/A Debridement Treatment Procedure was tolerated well N/A N/A Response: Post Debridement 0.5x1x0.2 N/A N/A Measurements L x W x D (cm) Post Debridement Volume: 0.079 N/A N/A (cm) Post Debridement Stage: Unstageable/Unclassified N/A N/A Taylor Castaneda ([id number]) Procedures Performed: Debridement N/A N/A Treatment Notes Wound #1 (Calcaneus) Wound Laterality: Left Cleanser Peri-Wound Care Topical Santyl Collagenase Ointment, 30 (gm), tube Discharge Instruction: apply nickel thick to wound bed only Primary Dressing Secondary Dressing Gauze Discharge Instruction: As directed: dry, moistened with saline or moistened with Dakins Solution Secured With 32M Medipore H Soft Cloth Surgical Tape, 2x2 (in/yd) Kerlix Roll Sterile or Non-Sterile 6-ply 4.5x4 (yd/yd) Discharge Instruction: Apply Kerlix as directed Compression Wrap Compression Stockings Add-Ons Electronic Signature(s) Signed:  09/28/2020 5:04:28 PM By: Taylor Ham MD Entered By: Taylor Castaneda on 09/28/2020 14:59:52 Taylor Castaneda ([id number]) -------------------------------------------------------------------------------- Multi-Disciplinary Care Plan Details Patient Name: Taylor Castaneda Date of Service: 09/28/2020 2:30 PM Medical Record Number: [id number] Patient Account Number: [id number] Date of Birth/Sex: 1932-12-18 (88 y.o. F) Treating RN: Taylor Castaneda Primary Care Gannon Heinzman: Taylor Castaneda Other Clinician: Jeanine Castaneda Referring Brylinn Teaney: Taylor Castaneda Treating Sharine Cadle/Extender: Taylor Castaneda in Treatment: 8 Active Inactive Medication Nursing Diagnoses: Knowledge deficit related to medication safety: actual or potential Goals: Patient/caregiver Castaneda demonstrate understanding of all current medications Date Initiated: 08/03/2020 Target Resolution Date: 08/17/2020 Goal Status: Active Patient/caregiver Castaneda demonstrate understanding of new oral/IV medications prescribed at the [facility] (topical prescriptions are covered under the skin breakdown problem) Date Initiated: 08/03/2020 Target Resolution Date: 08/17/2020 Goal Status: Active Interventions: Assess for medication contraindications each visit where new medications are prescribed Assess patient/caregiver ability to manage medication regimen upon admission and as needed Patient/Caregiver given reconciled medication list upon admission, changes in medications and discharge from the [place] education on medication safety Treatment Activities: New medication prescribed at [facility] : 08/03/2020 Notes: Necrotic Tissue Nursing Diagnoses: Impaired tissue integrity related to necrotic/devitalized tissue Knowledge deficit related to management of necrotic/devitalized tissue Goals: Necrotic/devitalized tissue Castaneda be minimized in the wound bed Date Initiated: 08/03/2020 Target Resolution Date: 08/17/2020 Goal Status:  Active Patient/caregiver Castaneda verbalize understanding of reason and process for debridement of necrotic tissue Date Initiated: 08/03/2020 Target Resolution Date: 08/17/2020 Goal Status: Active Interventions: Assess patient pain level pre-, during and post procedure and prior to discharge Provide education on necrotic tissue and debridement process Treatment Activities: Apply topical anesthetic as ordered : 08/03/2020 Excisional debridement : 08/03/2020 Notes: Wound/Skin Impairment Nursing Diagnoses: Impaired tissue integrity Knowledge deficit related to smoking impact on wound healing TANEAL, SONNTAG ([id number]) Knowledge deficit related to ulceration/compromised skin integrity Goals: Patient/caregiver Castaneda verbalize understanding of skin care regimen Date Initiated: 08/03/2020 Target Resolution Date: 08/17/2020 Goal Status: Active Ulcer/skin breakdown Castaneda have a volume reduction of 30% by week 4 Date Initiated: 08/03/2020 Target Resolution Date: 09/03/2020 Goal Status: Active Interventions: Assess patient/caregiver ability to obtain necessary supplies Assess patient/caregiver ability to perform ulcer/skin care regimen upon admission and as needed Assess ulceration(s) every visit Treatment Activities: Patient referred to home care : 08/03/2020 Skin care regimen initiated : 08/03/2020 Topical wound management initiated : 08/03/2020 Notes: Electronic Signature(s) Signed: 09/28/2020 4:08:37 PM By: Gretta Cool, BSN, RN, CWS, Kim RN, BSN Entered By: Gretta Cool, BSN, RN, CWS, Kim on 09/28/2020 14:53:23 Taylor Castaneda ([id number]) -------------------------------------------------------------------------------- Pain Assessment Details Patient Name: Taylor Castaneda  Date of Service: 09/28/2020 2:30 PM Medical Record Number: [id number] Patient Account Number: [id number] Date of Birth/Sex: May 11, 1933 (88 y.o. F) Treating RN: Taylor Castaneda Primary Care Schylar Wuebker: Taylor Castaneda Other Clinician: Jeanine Castaneda Referring Jackqueline Aquilar: Taylor Castaneda Treating Cortnie Ringel/Extender: Taylor Castaneda in Treatment: 8 Active Problems Location of Pain Severity and Description of Pain Patient Has Paino No Site Locations Rate the pain. Current Pain Level: 0 Pain Management and Medication Current Pain Management: Electronic Signature(s) Signed: 09/28/2020 4:46:55 PM By: Taylor Castaneda Entered By: Taylor Castaneda on 09/28/2020 14:34:02 Taylor Castaneda ([id number]) -------------------------------------------------------------------------------- Patient/Caregiver Education Details Patient Name: Taylor Castaneda Date of Service: 09/28/2020 2:30 PM Medical Record Number: [id number] Patient Account Number: [id number] Date of Birth/Gender: April 04, 1933 (88 y.o. F) Treating RN: Taylor Castaneda Primary Care Physician: Taylor Castaneda Other Clinician: Jeanine Castaneda Referring Physician: Ria Castaneda Treating Physician/Extender: Taylor Castaneda in Treatment: 8 Education Assessment Education Provided To: Patient Education Topics Provided Medication Safety: Handouts: Medication Safety Methods: Demonstration, Explain/Verbal Responses: State content correctly Wound Debridement: Handouts: Wound Debridement Methods: Demonstration, Explain/Verbal Responses: State content correctly Electronic Signature(s) Signed: 09/28/2020 4:08:37 PM By: Gretta Cool, BSN, RN, CWS, Kim RN, BSN Entered By: Gretta Cool, BSN, RN, CWS, Kim on 09/28/2020 14:55:55 Taylor Castaneda ([id number]) -------------------------------------------------------------------------------- Wound Assessment Details Patient Name: Taylor Castaneda Date of Service: 09/28/2020 2:30 PM Medical Record Number: [id number] Patient Account Number: [id number] Date of Birth/Sex: [birth year] (88 y.o. F) Treating RN: Taylor Castaneda Primary Care Anouk Critzer: Taylor Castaneda Other Clinician: Jeanine Castaneda Referring Phuc Kluttz: Taylor Castaneda Treating  Jonhatan Hearty/Extender: Taylor Castaneda in Treatment: 8 Wound Status Wound Number: 1 Primary Pressure Ulcer Etiology: Wound Location: Left Calcaneus Wound Open Wounding Event: Gradually Appeared Status: Date Acquired: 06/22/2020 Comorbid Cataracts, Hypertension, Myocardial Infarction, Peripheral Weeks Of Treatment: 8 History: Arterial Disease, Type I Diabetes, End Stage Renal Clustered Wound: No Disease, Gout, Seizure Disorder Photos Wound Measurements Length: (cm) 0.5 Width: (cm) 1 Depth: (cm) 0.2 Area: (cm) 0.393 Volume: (cm) 0.079 % Reduction in Area: 92.6% % Reduction in Volume: 85.1% Epithelialization: Small (1-33%) Tunneling: No Undermining: No Wound Description Classification: Unstageable/Unclassified Foul Exudate Amount: Medium Slou Exudate Type: Serous Exudate Color: amber Odor After Cleansing: No gh/Fibrino Yes Wound Bed Granulation Amount: Small (1-33%) Exposed Structure Granulation Quality: Red, Pink Fascia Exposed: No Necrotic Amount: Large (67-100%) Fat Layer (Subcutaneous Tissue) Exposed: Yes Necrotic Quality: Adherent Slough Tendon Exposed: No Muscle Exposed: No Joint Exposed: No Bone Exposed: No Treatment Notes Wound #1 (Calcaneus) Wound Laterality: Left Cleanser Peri-Wound Care Topical Santyl Collagenase Ointment, 30 (gm), tube HOLLAND, KOTTER ([id number]) Discharge Instruction: apply nickel thick to wound bed only Primary Dressing Secondary Dressing Gauze Discharge Instruction: As directed: dry, moistened with saline or moistened with Dakins Solution Secured With 28M Medipore H Soft Cloth Surgical Tape, 2x2 (in/yd) Kerlix Roll Sterile or Non-Sterile 6-ply 4.5x4 (yd/yd) Discharge Instruction: Apply Kerlix as directed Compression Wrap Compression Stockings Add-Ons Electronic Signature(s) Signed: 09/28/2020 4:46:55 PM By: Taylor Castaneda Entered By: Taylor Castaneda on 09/28/2020 14:36:48 Taylor Castaneda  ([id number]) -------------------------------------------------------------------------------- Vitals Details Patient Name: Taylor Castaneda Date of Service: 09/28/2020 2:30 PM Medical Record Number: [id number] Patient Account Number: [id number] Date of Birth/Sex: 1932-10-12 (88 y.o. F) Treating RN: Taylor Castaneda Primary Care Kha Hari: Taylor Castaneda Other Clinician: Jeanine Castaneda Referring Alanya Vukelich: Taylor Castaneda Treating Seretha Estabrooks/Extender: Taylor Castaneda in Treatment: 8 Vital Signs Time Taken: 14:33 Temperature (F): 97.7 Pulse (bpm): 85 Respiratory Rate (breaths/min): 18 Blood Pressure (mmHg): 188/78 Reference Range: 80 - 120 mg / dl  Electronic Signature(s) Signed: 09/28/2020 4:46:55 PM By: Taylor Castaneda Entered ByDonnamarie Castaneda on 09/28/2020 14:33:53

## 2020-09-29 NOTE — Progress Notes (Signed)
Called and spoke with the caregiver for [place]. Her current BP's  09/28/20   125/63   59P   Breakfast      164/75   63P   Dinner   09/29/20   173/74   61P   Breakfast                 165/76   61     Lunch   They were reminded of the F/U appointment with the PCP 10/21/2020 @ 3:30pm and to take the BP log with them.  [name], CPP notified  Avel Sensor, [place] Assistant [phone]

## 2020-09-30 ENCOUNTER — Telehealth: Payer: Self-pay

## 2020-09-30 MED ORDER — AMLODIPINE BESYLATE 5 MG PO TABS: 5.0000 mg | ORAL_TABLET | Freq: Every day | ORAL | 6 refills | Status: DC

## 2020-09-30 NOTE — Telephone Encounter (Signed)
Let's add on amlodipine 5mg  to her current regimen. New med sent to pharmacy (1 mo supply with refills). Watch for ankle swelling on this regimen.

## 2020-09-30 NOTE — Telephone Encounter (Signed)
Patient's home BP still elevated. Losartan increased to 50 mg on 08/19/20. Next PCP visit 10/21/20.   Prior treatments: amlodipine, ramipril  Current antihypertensive regimen:            Carvedilol 12.5 mg - 1 tablet BID            Losartan  50 mg - 1 tablet daily  Home BP log: 09/29/20        173/74             61   Breakfast                     165/76            61     Lunch 09/28/20        125/63            59   Breakfast                     164/75            63    Dinner 09/11/20        137/73             69      Breakfast                     144/66             67      Dinner 09/10/20          138/71             66 Breakfast                      188/75             72 Dinner 09/09/20          152/67             59 Breakfast                      162/69             65 Dinner  Wrist or arm cuff: arm Caffeine intake:none Salt intake:  Uses salt lighty on food. OTC medications including pseudoephedrine or NSAIDs? None   BMP Latest Ref Rng & Units 08/26/2020 07/29/2020 07/12/2020  Glucose 65 - 99 mg/dL 157(H) 152(H) 226(H)  BUN 7 - 25 mg/dL 41(H) 24(H) 30(H)  Creatinine 0.60 - 0.88 mg/dL 1.01(H) 1.17 1.28(H)  BUN/Creat Ratio 6 - 22 (calc) 41(H) - -  Sodium 135 - 146 mmol/L 140 139 137  Potassium 3.5 - 5.3 mmol/L 4.1 5.0 4.5  Chloride 98 - 110 mmol/L 109 103 104  CO2 20 - 32 mmol/L 18(L) 27 23  Calcium 8.6 - 10.4 mg/dL 8.7 9.7 8.5    Chart reviewed. Consult PCP - consider further increase losartan to 100 mg daily  [name], PharmD Clinical Pharmacist [facility] [phone]

## 2020-10-01 NOTE — Telephone Encounter (Signed)
Please let patient know Dr. Darnell Level added new BP medication due to high readings - amlodipine 5 mg daily. Call us is she has ankle swelling.  [name], PharmD Clinical Pharmacist [facility] [phone]

## 2020-10-05 NOTE — Progress Notes (Addendum)
Called Taylor Castaneda and her husband was informed of the new medication from Dr.G. Amlodipine 5 mg - 1 tablet daily, the patient is advised to watch out for ankle swelling. BP reported on 10/05/2020 is 150/69 59P at breakfast.  [name], CPP notified  Avel Sensor, [place] Assistant [phone]

## 2020-10-12 ENCOUNTER — Other Ambulatory Visit: Payer: Self-pay

## 2020-10-12 ENCOUNTER — Encounter: Payer: Medicare Other | Attending: Internal Medicine | Admitting: Internal Medicine

## 2020-10-12 DIAGNOSIS — I129 Hypertensive chronic kidney disease with stage 1 through stage 4 chronic kidney disease, or unspecified chronic kidney disease: Secondary | ICD-10-CM | POA: Diagnosis not present

## 2020-10-12 DIAGNOSIS — L89620 Pressure ulcer of left heel, unstageable: Secondary | ICD-10-CM | POA: Diagnosis not present

## 2020-10-12 DIAGNOSIS — E11621 Type 2 diabetes mellitus with foot ulcer: Secondary | ICD-10-CM | POA: Insufficient documentation

## 2020-10-12 DIAGNOSIS — L97811 Non-pressure chronic ulcer of other part of right lower leg limited to breakdown of skin: Secondary | ICD-10-CM | POA: Diagnosis not present

## 2020-10-12 DIAGNOSIS — E1122 Type 2 diabetes mellitus with diabetic chronic kidney disease: Secondary | ICD-10-CM | POA: Diagnosis not present

## 2020-10-12 DIAGNOSIS — E1151 Type 2 diabetes mellitus with diabetic peripheral angiopathy without gangrene: Secondary | ICD-10-CM | POA: Diagnosis not present

## 2020-10-12 DIAGNOSIS — N1832 Chronic kidney disease, stage 3b: Secondary | ICD-10-CM | POA: Diagnosis not present

## 2020-10-12 DIAGNOSIS — I6930 Unspecified sequelae of cerebral infarction: Secondary | ICD-10-CM | POA: Diagnosis not present

## 2020-10-13 NOTE — Progress Notes (Signed)
AKIRA, PERUSSE ([id number]) Visit Report for 10/12/2020 Arrival Information Details Patient Name: Taylor Castaneda, Taylor Castaneda. Date of Service: 10/12/2020 2:30 PM Medical Record Number: [id number] Patient Account Number: [id number] Date of Birth/Sex: [birth year] (88 y.o. F) Treating RN: Donnamarie Poag Primary Care Luan Urbani: [name] Other Clinician: Referring Roselynne Lortz: [name] Treating Mikaylah Libbey/Extender: Tito Dine in Treatment: 10 Visit Information History Since Last Visit Added or deleted any medications: No Patient Arrived: Wheel Chair Had a fall or experienced change in No Arrival Time: 14:52 activities of daily living that may affect Accompanied By: son risk of falls: Transfer Assistance: EasyPivot Patient Lift Hospitalized since last visit: No Patient Identification Verified: Yes Has Dressing in Place as Prescribed: Yes Secondary Verification Process Completed: Yes Pain Present Now: No Patient Requires Transmission-Based No Precautions: Patient Has Alerts: Yes Patient Alerts: Patient on Blood Thinner Type I Diabetic Electronic Signature(s) Signed: 10/12/2020 4:50:40 PM By: Donnamarie Poag Entered By: Donnamarie Poag on 10/12/2020 14:54:35 Taylor Castaneda ([id number]) -------------------------------------------------------------------------------- Clinic Level of Care Assessment Details Patient Name: Taylor Castaneda Date of Service: 10/12/2020 2:30 PM Medical Record Number: [id number] Patient Account Number: [id number] Date of Birth/Sex: [birth year] (88 y.o. F) Treating RN: Cornell Barman Primary Care Briannia Laba: [name] Other Clinician: Referring Shere Eisenhart: [name] Treating Raffaela Ladley/Extender: Tito Dine in Treatment: 10 Clinic Level of Care Assessment Items TOOL 3 Quantity Score []  - Use when EandM and Procedure is performed on FOLLOW-UP visit 0 ASSESSMENTS - Nursing Assessment / Reassessment X - Reassessment of Co-morbidities  (includes updates in patient status) 1 10 X- 1 5 Reassessment of Adherence to Treatment Plan ASSESSMENTS - Wound and Skin Assessment / Reassessment []  - Points for Wound Assessment can only be taken for a new wound of unknown or different etiology and a 0 procedure is NOT performed to that wound X- 1 5 Simple Wound Assessment / Reassessment - one wound []  - 0 Complex Wound Assessment / Reassessment - multiple wounds []  - 0 Dermatologic / Skin Assessment (not related to wound area) ASSESSMENTS - Focused Assessment []  - Circumferential Edema Measurements - multi extremities 0 []  - 0 Nutritional Assessment / Counseling / Intervention []  - 0 Lower Extremity Assessment (monofilament, tuning fork, pulses) []  - 0 Peripheral Arterial Disease Assessment (using hand held doppler) ASSESSMENTS - Ostomy and/or Continence Assessment and Care []  - Incontinence Assessment and Management 0 []  - 0 Ostomy Care Assessment and Management (repouching, etc.) PROCESS - Coordination of Care []  - Points for Discharge Coordination can only be taken for a new wound of unknown or different etiology and a 0 procedure is NOT performed to that wound X- 1 15 Simple Patient / Family Education for ongoing care []  - 0 Complex (extensive) Patient / Family Education for ongoing care []  - 0 Staff obtains Programmer, systems, Records, Test Results / Process Orders []  - 0 Staff telephones HHA, Nursing Homes / Clarify orders / etc []  - 0 Routine Transfer to another Facility (non-emergent condition) []  - 0 Routine Hospital Admission (non-emergent condition) []  - 0 New Admissions / Biomedical engineer / Ordering NPWT, Apligraf, etc. []  - 0 Emergency Hospital Admission (emergent condition) X- 1 10 Simple Discharge Coordination []  - 0 Complex (extensive) Discharge Coordination PROCESS - Special Needs []  - Pediatric / Minor Patient Management 0 []  - 0 Isolation Patient Management []  - 0 Hearing / Language / Visual  special needs []  - 0 Assessment of Community assistance (transportation, D/C planning, etc.) Taylor Castaneda, Taylor B. ([id number]) []  - 0 Additional assistance /  Altered mentation []  - 0 Support Surface(s) Assessment (bed, cushion, seat, etc.) INTERVENTIONS - Wound Cleansing / Measurement []  - Points for Wound Cleaning / Measurement, Wound Dressing, Specimen Collection and Specimen taken to lab 0 can only be taken for a new wound of unknown or different etiology and a procedure is NOT performed to that wound X- 1 5 Simple Wound Cleansing - one wound []  - 0 Complex Wound Cleansing - multiple wounds X- 1 5 Wound Imaging (photographs - any number of wounds) []  - 0 Wound Tracing (instead of photographs) X- 1 5 Simple Wound Measurement - one wound []  - 0 Complex Wound Measurement - multiple wounds INTERVENTIONS - Wound Dressings []  - Small Wound Dressing one or multiple wounds 0 X- 1 15 Medium Wound Dressing one or multiple wounds []  - 0 Large Wound Dressing one or multiple wounds INTERVENTIONS - Miscellaneous []  - External ear exam 0 []  - 0 Specimen Collection (cultures, biopsies, blood, body fluids, etc.) []  - 0 Specimen(s) / Culture(s) sent or taken to Lab for analysis []  - 0 Patient Transfer (multiple staff / Civil Service fast streamer / Similar devices) []  - 0 Simple Staple / Suture removal (25 or less) []  - 0 Complex Staple / Suture removal (26 or more) []  - 0 Hypo / Hyperglycemic Management (close monitor of Blood Glucose) []  - 0 Ankle / Brachial Index (ABI) - do not check if billed separately []  - 0 Vital Signs Has the patient been seen at the hospital within the last three years: Yes Total Score: 75 Level Of Care: New/Established - Level 2 Electronic Signature(s) Signed: 10/12/2020 6:06:01 PM By: Gretta Cool, BSN, RN, CWS, Kim RN, BSN Entered By: Gretta Cool, BSN, RN, CWS, Kim on 10/12/2020 15:18:30 Taylor Castaneda  ([id number]) -------------------------------------------------------------------------------- Encounter Discharge Information Details Patient Name: Taylor Castaneda Date of Service: 10/12/2020 2:30 PM Medical Record Number: [id number] Patient Account Number: [id number] Date of Birth/Sex: [birth year] (88 y.o. F) Treating RN: Cornell Barman Primary Care Tyaira Heward: [name] Other Clinician: Referring Kyung Muto: [name] Treating Jaxxson Cavanah/Extender: Tito Dine in Treatment: 10 Encounter Discharge Information Items Post Procedure Vitals Discharge Condition: Stable Temperature (F): 98 Ambulatory Status: Ambulatory Pulse (bpm): 60 Discharge Destination: Home Respiratory Rate (breaths/min): 18 Transportation: Private Auto Blood Pressure (mmHg): 165/83 Accompanied By: son Schedule Follow-up Appointment: Yes Clinical Summary of Care: Electronic Signature(s) Signed: 10/12/2020 6:06:01 PM By: Gretta Cool, BSN, RN, CWS, Kim RN, BSN Entered By: Gretta Cool, BSN, RN, CWS, Kim on 10/12/2020 15:20:14 Taylor Castaneda ([id number]) -------------------------------------------------------------------------------- Lower Extremity Assessment Details Patient Name: Taylor Castaneda Date of Service: 10/12/2020 2:30 PM Medical Record Number: [id number] Patient Account Number: [id number] Date of Birth/Sex: Oct 08, 1932 (88 y.o. F) Treating RN: Donnamarie Poag Primary Care Avalin Briley: [name] Other Clinician: Referring Ouita Nish: [name] Treating Oralia Criger/Extender: Ricard Dillon Weeks in Treatment: 10 Edema Assessment Assessed: [Left: Yes] [Right: No] Edema: [Left: N] [Right: o] Vascular Assessment Pulses: Dorsalis Pedis Palpable: [Left:Yes] Electronic Signature(s) Signed: 10/12/2020 4:50:40 PM By: Donnamarie Poag Entered By: Donnamarie Poag on 10/12/2020 14:58:21 Taylor Castaneda ([id number]) -------------------------------------------------------------------------------- Multi Wound  Chart Details Patient Name: Taylor Castaneda Date of Service: 10/12/2020 2:30 PM Medical Record Number: [id number] Patient Account Number: [id number] Date of Birth/Sex: [birth year] (88 y.o. F) Treating RN: Cornell Barman Primary Care Britt Petroni: [name] Other Clinician: Referring Berk Pilot: [name] Treating Jahkai Yandell/Extender: Tito Dine in Treatment: 10 Vital Signs Height(in): Pulse(bpm): 60 Weight(lbs): Blood Pressure(mmHg): 165/83 Body Mass Index(BMI): Temperature(F): 98 Respiratory Rate(breaths/min): 18 Photos: [2:No Photos] [N/A:N/A] Wound Location:  Left Calcaneus Right, Midline Lower Leg N/A Wounding Event: Gradually Appeared Gradually Appeared N/A Primary Etiology: Pressure Ulcer Atypical N/A Comorbid History: Cataracts, Hypertension, Myocardial N/A N/A Infarction, Peripheral Arterial Disease, Type I Diabetes, End Stage Renal Disease, Gout, Seizure Disorder Date Acquired: 06/22/2020 09/12/2020 N/A Weeks of Treatment: 10 0 N/A Wound Status: Open Open N/A Measurements L x W x D (cm) 0.5x0.4x0.1 0.5x0.3x0.1 N/A Area (cm) : 0.157 0.118 N/A Volume (cm) : 0.016 0.012 N/A % Reduction in Area: 97.00% N/A N/A % Reduction in Volume: 97.00% N/A N/A Classification: Unstageable/Unclassified N/A N/A Exudate Amount: None Present N/A N/A Granulation Amount: None Present (0%) N/A N/A Necrotic Amount: Large (67-100%) N/A N/A Necrotic Tissue: Eschar N/A N/A Exposed Structures: Fat Layer (Subcutaneous Tissue): N/A N/A Yes Fascia: No Tendon: No Muscle: No Joint: No Bone: No Epithelialization: Small (1-33%) N/A N/A Debridement: Debridement - Excisional N/A N/A Pre-procedure Verification/Time 15:14 N/A N/A Out Taken: Tissue Debrided: Subcutaneous, Slough N/A N/A Level: Skin/Subcutaneous Tissue N/A N/A Debridement Area (sq cm): 0.2 N/A N/A Instrument: Curette N/A N/A Bleeding: Minimum N/A N/A Hemostasis Achieved: Pressure N/A N/A Debridement Treatment  Procedure was tolerated well N/A N/A Response: Post Debridement 0.5x0.4x0.2 N/A N/A Measurements L x W x D (cm) 0.031 N/A N/A Taylor Castaneda, Taylor Castaneda ([id number]) Post Debridement Volume: (cm) Post Debridement Stage: Unstageable/Unclassified N/A N/A Procedures Performed: Debridement N/A N/A Treatment Notes Electronic Signature(s) Signed: 10/12/2020 4:52:48 PM By: Linton Ham MD Entered By: Linton Ham on 10/12/2020 15:16:06 Taylor Castaneda ([id number]) -------------------------------------------------------------------------------- Multi-Disciplinary Care Plan Details Patient Name: Taylor Castaneda Date of Service: 10/12/2020 2:30 PM Medical Record Number: [id number] Patient Account Number: [id number] Date of Birth/Sex: [birth year] (88 y.o. F) Treating RN: Cornell Barman Primary Care Keonta Alsip: [name] Other Clinician: Referring Shawneen Deetz: [name] Treating Shalisha Clausing/Extender: Tito Dine in Treatment: 10 Active Inactive Medication Nursing Diagnoses: Knowledge deficit related to medication safety: actual or potential Goals: Patient/caregiver will demonstrate understanding of all current medications Date Initiated: 08/03/2020 Target Resolution Date: 08/17/2020 Goal Status: Active Patient/caregiver will demonstrate understanding of new oral/IV medications prescribed at the [facility] (topical prescriptions are covered under the skin breakdown problem) Date Initiated: 08/03/2020 Target Resolution Date: 08/17/2020 Goal Status: Active Interventions: Assess for medication contraindications each visit where new medications are prescribed Assess patient/caregiver ability to manage medication regimen upon admission and as needed Patient/Caregiver given reconciled medication list upon admission, changes in medications and discharge from the [place] education on medication safety Treatment Activities: Education provided on Medication Safety : 09/28/2020 New  medication prescribed at [place] : 08/03/2020 Notes: Necrotic Tissue Nursing Diagnoses: Impaired tissue integrity related to necrotic/devitalized tissue Knowledge deficit related to management of necrotic/devitalized tissue Goals: Necrotic/devitalized tissue will be minimized in the wound bed Date Initiated: 08/03/2020 Target Resolution Date: 08/17/2020 Goal Status: Active Patient/caregiver will verbalize understanding of reason and process for debridement of necrotic tissue Date Initiated: 08/03/2020 Target Resolution Date: 08/17/2020 Goal Status: Active Interventions: Assess patient pain level pre-, during and post procedure and prior to discharge Provide education on necrotic tissue and debridement process Treatment Activities: Apply topical anesthetic as ordered : 08/03/2020 Excisional debridement : 08/03/2020 Notes: Wound/Skin Impairment Nursing Diagnoses: Impaired tissue integrity Taylor Castaneda, Taylor Castaneda ([id number]) Knowledge deficit related to smoking impact on wound healing Knowledge deficit related to ulceration/compromised skin integrity Goals: Patient/caregiver will verbalize understanding of skin care regimen Date Initiated: 08/03/2020 Target Resolution Date: 08/17/2020 Goal Status: Active Ulcer/skin breakdown will have a volume reduction of 30% by week 4 Date Initiated: 08/03/2020 Target Resolution  Date: 09/03/2020 Goal Status: Active Interventions: Assess patient/caregiver ability to obtain necessary supplies Assess patient/caregiver ability to perform ulcer/skin care regimen upon admission and as needed Assess ulceration(s) every visit Treatment Activities: Patient referred to home care : 08/03/2020 Skin care regimen initiated : 08/03/2020 Topical wound management initiated : 08/03/2020 Notes: Electronic Signature(s) Signed: 10/12/2020 6:06:01 PM By: Gretta Cool, BSN, RN, CWS, Kim RN, BSN Entered By: Gretta Cool, BSN, RN, CWS, Kim on 10/12/2020 15:13:24 Taylor Castaneda  ([id number]) -------------------------------------------------------------------------------- Pain Assessment Details Patient Name: Taylor Castaneda Date of Service: 10/12/2020 2:30 PM Medical Record Number: [id number] Patient Account Number: [id number] Date of Birth/Sex: [birth year] (88 y.o. F) Treating RN: Donnamarie Poag Primary Care Lindie Roberson: [name] Other Clinician: Referring Rolf Fells: [name] Treating Xenia Nile/Extender: Tito Dine in Treatment: 10 Active Problems Location of Pain Severity and Description of Pain Patient Has Paino No Site Locations Rate the pain. Current Pain Level: 0 Pain Management and Medication Current Pain Management: Electronic Signature(s) Signed: 10/12/2020 4:50:40 PM By: Donnamarie Poag Entered By: Donnamarie Poag on 10/12/2020 14:55:03 Taylor Castaneda ([id number]) -------------------------------------------------------------------------------- Wound Assessment Details Patient Name: Taylor Castaneda Date of Service: 10/12/2020 2:30 PM Medical Record Number: [id number] Patient Account Number: [id number] Date of Birth/Sex: January 18, 1933 (88 y.o. F) Treating RN: Donnamarie Poag Primary Care Zhion Pevehouse: [name] Other Clinician: Referring Etna Forquer: [name] Treating Vearl Aitken/Extender: Tito Dine in Treatment: 10 Wound Status Wound Number: 1 Primary Pressure Ulcer Etiology: Wound Location: Left Calcaneus Wound Open Wounding Event: Gradually Appeared Status: Date Acquired: 06/22/2020 Comorbid Cataracts, Hypertension, Myocardial Infarction, Peripheral Weeks Of Treatment: 10 History: Arterial Disease, Type I Diabetes, End Stage Renal Clustered Wound: No Disease, Gout, Seizure Disorder Photos Wound Measurements Length: (cm) 0.5 % Reduc Width: (cm) 0.4 % Reduc Depth: (cm) 0.1 Epithel Area: (cm) 0.157 Tunnel Volume: (cm) 0.016 Underm tion in Area: 97% tion in Volume: 97% ialization: Small (1-33%) ing:  No ining: No Wound Description Classification: Unstageable/Unclassified Foul O Exudate Amount: None Present Slough dor After Cleansing: No /Fibrino Yes Wound Bed Granulation Amount: None Present (0%) Exposed Structure Necrotic Amount: Large (67-100%) Fascia Exposed: No Necrotic Quality: Eschar Fat Layer (Subcutaneous Tissue) Exposed: Yes Tendon Exposed: No Muscle Exposed: No Joint Exposed: No Bone Exposed: No Treatment Notes Wound #1 (Calcaneus) Wound Laterality: Left Cleanser Peri-Wound Care Topical Primary Dressing Taylor Castaneda, Taylor Castaneda ([id number]) Secondary Dressing Gauze Discharge Instruction: As directed: dry, moistened with saline or moistened with Dakins Solution Secured With [employer] Sterile or Non-Sterile 6-ply 4.5x4 (yd/yd) Discharge Instruction: Apply Kerlix as directed 41M Medipore H Soft Cloth Surgical Tape, 2x2 (in/yd) Compression Wrap Compression Stockings Add-Ons Electronic Signature(s) Signed: 10/12/2020 4:50:40 PM By: Donnamarie Poag Entered By: Donnamarie Poag on 10/12/2020 14:58:03 Taylor Castaneda ([id number]) -------------------------------------------------------------------------------- Wound Assessment Details Patient Name: Taylor Castaneda Date of Service: 10/12/2020 2:30 PM Medical Record Number: [id number] Patient Account Number: [id number] Date of Birth/Sex: [birth year] (88 y.o. F) Treating RN: Cornell Barman Primary Care Lonnie Reth: [name] Other Clinician: Referring Giordan Fordham: [name] Treating Zillah Alexie/Extender: Tito Dine in Treatment: 10 Wound Status Wound Number: 2 Primary Etiology: Atypical Wound Location: Right, Midline Lower Leg Wound Status: Open Wounding Event: Gradually Appeared Date Acquired: 09/12/2020 Weeks Of Treatment: 0 Clustered Wound: No Photos Photo Uploaded By: Georges Mouse, Minus Breeding on 10/12/2020 16:32:37 Wound Measurements Length: (cm) Width: (cm) Depth: (cm) Area: (cm) Volume: (cm) 0.5 %  Reduction in Area: 0.3 % Reduction in Volume: 0.1 0.118 0.012 Treatment Notes Wound #2 (Lower Leg) Wound Laterality: Right, Midline Cleanser Peri-Wound  Care Topical Primary Dressing Silvercel Small 2x2 (in/in) Discharge Instruction: Apply Silvercel Small 2x2 (in/in) as instructed Secondary Dressing Gauze Discharge Instruction: As directed: dry, moistened with saline or moistened with Dakins Solution Secured With [employer] Sterile or Non-Sterile 6-ply 4.5x4 (yd/yd) Discharge Instruction: Apply Kerlix as directed 41M Medipore H Soft Cloth Surgical Tape, 2x2 (in/yd) Compression Wrap Compression Stockings Taylor Castaneda, Taylor Castaneda ([id number]) Add-Ons Electronic Signature(s) Signed: 10/12/2020 6:06:01 PM By: Gretta Cool, BSN, RN, CWS, Kim RN, BSN Entered By: Gretta Cool, BSN, RN, CWS, Kim on 10/12/2020 15:12:59 Taylor Castaneda ([id number]) -------------------------------------------------------------------------------- [place] Details Patient Name: Taylor Castaneda Date of Service: 10/12/2020 2:30 PM Medical Record Number: [id number] Patient Account Number: [id number] Date of Birth/Sex: May 27, 1933 (88 y.o. F) Treating RN: Donnamarie Poag Primary Care Willford Rabideau: [name] Other Clinician: Referring Reginia Battie: [name] Treating Laresha Bacorn/Extender: Tito Dine in Treatment: 10 Vital Signs Time Taken: 14:50 Temperature (F): 98 Pulse (bpm): 60 Respiratory Rate (breaths/min): 18 Blood Pressure (mmHg): 165/83 Reference Range: 80 - 120 mg / dl Electronic Signature(s) Signed: 10/12/2020 4:50:40 PM By: Donnamarie Poag Entered ByDonnamarie Poag on 10/12/2020 14:54:50

## 2020-10-13 NOTE — Progress Notes (Signed)
EAVAN, GONTERMAN ([id number]) Visit Report for 10/12/2020 Debridement Details Patient Name: Taylor Castaneda, Taylor Castaneda. Date of Service: 10/12/2020 2:30 PM Medical Record Number: [id number] Patient Account Number: [id number] Date of Birth/Sex: 26-Apr-1933 (88 y.o. F) Treating RN: Cornell Barman Primary Care Provider: [name] Other Clinician: Referring Provider: [name] Treating Provider/Extender: Tito Dine in Treatment: 10 Debridement Performed for Wound #1 Left Calcaneus Assessment: Performed By: Physician Ricard Dillon, MD Debridement Type: Debridement Level of Consciousness (Pre- Awake and Alert procedure): Pre-procedure Verification/Time Out Yes - 15:14 Taken: Total Area Debrided (L x W): 0.5 (cm) x 0.4 (cm) = 0.2 (cm) Tissue and other material Viable, Non-Viable, Slough, Subcutaneous, Slough debrided: Level: Skin/Subcutaneous Tissue Debridement Description: Excisional Instrument: Curette Bleeding: Minimum Hemostasis Achieved: Pressure Response to Treatment: Procedure was tolerated well Level of Consciousness (Post- Awake and Alert procedure): Post Debridement Measurements of Total Wound Length: (cm) 0.5 Stage: Unstageable/Unclassified Width: (cm) 0.4 Depth: (cm) 0.2 Volume: (cm) 0.031 Character of Wound/Ulcer Post Debridement: Stable Post Procedure Diagnosis Same as Pre-procedure Electronic Signature(s) Signed: 10/12/2020 4:52:48 PM By: Linton Ham MD Signed: 10/12/2020 6:06:01 PM By: Gretta Cool, BSN, RN, CWS, Kim RN, BSN Entered By: Linton Ham on 10/12/2020 15:17:03 Taylor Castaneda ([id number]) -------------------------------------------------------------------------------- HPI Details Patient Name: Taylor Castaneda Date of Service: 10/12/2020 2:30 PM Medical Record Number: [id number] Patient Account Number: [id number] Date of Birth/Sex: 07-31-1932 (88 y.o. F) Treating RN: Cornell Barman Primary Care Provider: [name] Other  Clinician: Referring Provider: [name] Treating Provider/Extender: Tito Dine in Treatment: 10 History of Present Illness HPI Description: ADMISSION 08/03/2020 This is a 87 year old woman who was in the hospital last month suffering a right CVA with mild residual left-sided symptoms. Sometime during this hospitalization or shortly after she got home they noted that she had an ulcer on her left heel. Saw her primary doctor on 2/25 who correctly pointed out this was unstageable and referred her here. Patient does not complain of any pain. She has home health [[facility]] but we are not exactly sure what they are currently using for wound dressing. Past medical history includes hypertension, type 2 diabetes, stage IIIb chronic renal failure, recent right CVA is noted, laparoscopic cholecystectomy also last month, gout, seizures ABI in our clinic was 0.71 on the left 08/10/2020 upon evaluation today patient appears to be doing well with regard to her heel ulcer in my opinion. Fortunately there does not appear to be any signs of active infection at this time. No fever chills noted She does have an appointment or rather referral to get an appointment with vascular hopefully we can get that scheduled soon they called and left a message that has not been set up yet though were given the information for them to call the care of that today. 3/23; using Santyl on the pressure ulcer on her left heel. She has had a thorough vascular evaluation. Noncompressible bilaterally however also with monophasic waveforms and low TBI's with a TBI in the right of 0.38 and on the left 0.36. She was seen by vascular. They felt this was a pressure ulcer I think most everybody agrees with this. They also had a long discussion with the patient's son with regards to her PAD. She has diminished waveforms in the posterior tibial artery which is the vessel that perfuses her heel. Nevertheless I think the  agreement was a period of watchful waiting to see if Santyl and aggressive wound care can heal this area. The more aggressive approach of course is  an angiogram. I talked to the patient about this today with her son they are indeed in favor of a period of watchful waiting to see if wound care can get this area to close. She does not describe claudication with her minimal activity or at rest 3/30; using Santyl on her pressure ulcer on the tip of her heel. She also has PAD but we have been managing this conservatively [see my note above]. 4/6; still using Santyl. We are making improvements in surface area. This was initially unstageable but is down to the muscle layer. 4/13; still using Santyl. Continued nice improvements in surface area. Also the depth of the wound is improved 4/27; still using Santyl. Continues with dramatic improvement in surface area the depth of the wound also improved. No evidence of surrounding infection 5/11; the patient's area on the left heel is just about closed. I removed some skin and subcutaneous debris only a tiny open area remains underneath this. In passing I noted a bandage on the right upper mid tibia. Underneath this is a pale somewhat lifeless looking wound which the patient's son's been says has been there for now at least 6 months. It will scab over and then reopened and then scab over then reopened. Have been recently been using the Santyl for the left heel on it. Electronic Signature(s) Signed: 10/12/2020 4:52:48 PM By: Linton Ham MD Entered By: Linton Ham on 10/12/2020 15:20:32 Taylor Castaneda ([id number]) -------------------------------------------------------------------------------- Physical Exam Details Patient Name: Taylor Castaneda, Taylor Castaneda Date of Service: 10/12/2020 2:30 PM Medical Record Number: [id number] Patient Account Number: [id number] Date of Birth/Sex: [birth year] (88 y.o. F) Treating RN: Cornell Barman Primary Care Provider: [name] Other Clinician: Referring Provider: [name] Treating Provider/Extender: Tito Dine in Treatment: 10 Constitutional Patient is hypertensive.. Pulse regular and within target range for patient.Marland Kitchen Respirations regular, non-labored and within target range.. Temperature is normal and within the target range for the patient.Marland Kitchen appears in no distress. Notes Wound exam; continued improvement in the surface area of this wound which is now small on the tip of the left heel. I think this should be healed by next week I think Santyl is of limited usefulness will change to silver alginate o On the upper right anterior tibia there is a somewhat featureless open area. Some surrounding skin which is hard and firm with a small oval- shaped wound in the middle. No evidence of infection around the wound. Electronic Signature(s) Signed: 10/12/2020 4:52:48 PM By: Linton Ham MD Entered By: Linton Ham on 10/12/2020 15:21:50 Taylor Castaneda ([id number]) -------------------------------------------------------------------------------- Physician Orders Details Patient Name: Taylor Castaneda Date of Service: 10/12/2020 2:30 PM Medical Record Number: [id number] Patient Account Number: [id number] Date of Birth/Sex: [birth year] (88 y.o. F) Treating RN: Cornell Barman Primary Care Provider: [name] Other Clinician: Referring Provider: [name] Treating Provider/Extender: Tito Dine in Treatment: 10 Verbal / Phone Orders: No Diagnosis Coding Follow-up Appointments o Return Appointment in 2 weeks. Bathing/ Shower/ Hygiene o May shower; gently cleanse wound with antibacterial soap, rinse and pat dry prior to dressing wounds Off-Loading o Other: - Float heels to keep pressure off of them. Additional Orders / Instructions o Follow Nutritious Diet and Increase Protein Intake Wound Treatment Wound #1 - Calcaneus Wound Laterality: Left Secondary  Dressing: Gauze 3 x Per Day/30 Days Discharge Instructions: As directed: dry, moistened with saline or moistened with Dakins Solution Secured With: 54M Medipore H Soft Cloth Surgical Tape, 2x2 (in/yd) 3 x Per Day/30 Days  Secured With: [employer] or Non-Sterile 6-ply 4.5x4 (yd/yd) 3 x Per Day/30 Days Discharge Instructions: Apply Kerlix as directed Wound #2 - Lower Leg Wound Laterality: Right, Midline Primary Dressing: Silvercel Small 2x2 (in/in) 3 x Per Day/30 Days Discharge Instructions: Apply Silvercel Small 2x2 (in/in) as instructed Secondary Dressing: Gauze 3 x Per Day/30 Days Discharge Instructions: As directed: dry, moistened with saline or moistened with Dakins Solution Secured With: 42M Medipore H Soft Cloth Surgical Tape, 2x2 (in/yd) 3 x Per Day/30 Days Secured With: Kerlix Roll Sterile or Non-Sterile 6-ply 4.5x4 (yd/yd) 3 x Per Day/30 Days Discharge Instructions: Apply Kerlix as directed Electronic Signature(s) Signed: 10/12/2020 4:52:48 PM By: Linton Ham MD Signed: 10/12/2020 6:06:01 PM By: Gretta Cool, BSN, RN, CWS, Kim RN, BSN Entered By: Gretta Cool, BSN, RN, CWS, Kim on 10/12/2020 15:17:41 Taylor Castaneda ([id number]) -------------------------------------------------------------------------------- Problem List Details Patient Name: Taylor Castaneda, PAYER. Date of Service: 10/12/2020 2:30 PM Medical Record Number: [id number] Patient Account Number: [id number] Date of Birth/Sex: 06/18/1932 (88 y.o. F) Treating RN: Cornell Barman Primary Care Provider: [name] Other Clinician: Referring Provider: [name] Treating Provider/Extender: Tito Dine in Treatment: 10 Active Problems ICD-10 Encounter Code Description Active Date MDM Diagnosis E11.621 Type 2 diabetes mellitus with foot ulcer 08/03/2020 No Yes L89.620 Pressure ulcer of left heel, unstageable 08/03/2020 No Yes E11.51 Type 2 diabetes mellitus with diabetic peripheral angiopathy without 08/03/2020 No  Yes gangrene L97.811 Non-pressure chronic ulcer of other part of right lower leg limited to 10/12/2020 No Yes breakdown of skin Inactive Problems Resolved Problems Electronic Signature(s) Signed: 10/12/2020 4:52:48 PM By: Linton Ham MD Entered By: Linton Ham on 10/12/2020 15:15:55 Taylor Castaneda ([id number]) -------------------------------------------------------------------------------- Progress Note Details Patient Name: Taylor Castaneda Date of Service: 10/12/2020 2:30 PM Medical Record Number: [id number] Patient Account Number: [id number] Date of Birth/Sex: 1933/05/17 (88 y.o. F) Treating RN: Cornell Barman Primary Care Provider: [name] Other Clinician: Referring Provider: [name] Treating Provider/Extender: Tito Dine in Treatment: 10 Subjective History of Present Illness (HPI) ADMISSION 08/03/2020 This is a 87 year old woman who was in the hospital last month suffering a right CVA with mild residual left-sided symptoms. Sometime during this hospitalization or shortly after she got home they noted that she had an ulcer on her left heel. Saw her primary doctor on 2/25 who correctly pointed out this was unstageable and referred her here. Patient does not complain of any pain. She has home health [[facility]] but we are not exactly sure what they are currently using for wound dressing. Past medical history includes hypertension, type 2 diabetes, stage IIIb chronic renal failure, recent right CVA is noted, laparoscopic cholecystectomy also last month, gout, seizures ABI in our clinic was 0.71 on the left 08/10/2020 upon evaluation today patient appears to be doing well with regard to her heel ulcer in my opinion. Fortunately there does not appear to be any signs of active infection at this time. No fever chills noted She does have an appointment or rather referral to get an appointment with vascular hopefully we can get that scheduled soon they called  and left a message that has not been set up yet though were given the information for them to call the care of that today. 3/23; using Santyl on the pressure ulcer on her left heel. She has had a thorough vascular evaluation. Noncompressible bilaterally however also with monophasic waveforms and low TBI's with a TBI in the right of 0.38 and on the left 0.36. She was seen  by vascular. They felt this was a pressure ulcer I think most everybody agrees with this. They also had a long discussion with the patient's son with regards to her PAD. She has diminished waveforms in the posterior tibial artery which is the vessel that perfuses her heel. Nevertheless I think the agreement was a period of watchful waiting to see if Santyl and aggressive wound care can heal this area. The more aggressive approach of course is an angiogram. I talked to the patient about this today with her son they are indeed in favor of a period of watchful waiting to see if wound care can get this area to close. She does not describe claudication with her minimal activity or at rest 3/30; using Santyl on her pressure ulcer on the tip of her heel. She also has PAD but we have been managing this conservatively [see my note above]. 4/6; still using Santyl. We are making improvements in surface area. This was initially unstageable but is down to the muscle layer. 4/13; still using Santyl. Continued nice improvements in surface area. Also the depth of the wound is improved 4/27; still using Santyl. Continues with dramatic improvement in surface area the depth of the wound also improved. No evidence of surrounding infection 5/11; the patient's area on the left heel is just about closed. I removed some skin and subcutaneous debris only a tiny open area remains underneath this. In passing I noted a bandage on the right upper mid tibia. Underneath this is a pale somewhat lifeless looking wound which the patient's son's been says has been  there for now at least 6 months. It will scab over and then reopened and then scab over then reopened. Have been recently been using the Santyl for the left heel on it. Objective Constitutional Patient is hypertensive.. Pulse regular and within target range for patient.Marland Kitchen Respirations regular, non-labored and within target range.. Temperature is normal and within the target range for the patient.Marland Kitchen appears in no distress. Vitals Time Taken: 2:50 PM, Temperature: 98 F, Pulse: 60 bpm, Respiratory Rate: 18 breaths/min, Blood Pressure: 165/83 mmHg. General Notes: Wound exam; continued improvement in the surface area of this wound which is now small on the tip of the left heel. I think this should be healed by next week I think Santyl is of limited usefulness will change to silver alginate On the upper right anterior tibia there is a somewhat featureless open area. Some surrounding skin which is hard and firm with a small oval-shaped wound in the middle. No evidence of infection around the wound. Integumentary (Hair, Skin) Wound #1 status is Open. Original cause of wound was Gradually Appeared. The date acquired was: 06/22/2020. The wound has been in treatment 10 weeks. The wound is located on the Left Calcaneus. The wound measures 0.5cm length x 0.4cm width x 0.1cm depth; 0.157cm^2 area and Taylor Castaneda, Taylor B. ([id number]) 0.016cm^3 volume. There is Fat Layer (Subcutaneous Tissue) exposed. There is no tunneling or undermining noted. There is a none present amount of drainage noted. There is no granulation within the wound bed. There is a large (67-100%) amount of necrotic tissue within the wound bed including Eschar. Wound #2 status is Open. Original cause of wound was Gradually Appeared. The date acquired was: 09/12/2020. The wound is located on the Right,Midline Lower Leg. The wound measures 0.5cm length x 0.3cm width x 0.1cm depth; 0.118cm^2 area and 0.012cm^3 volume. Assessment Active  Problems ICD-10 Type 2 diabetes mellitus with foot ulcer Pressure ulcer of  left heel, unstageable Type 2 diabetes mellitus with diabetic peripheral angiopathy without gangrene Non-pressure chronic ulcer of other part of right lower leg limited to breakdown of skin Procedures Wound #1 Pre-procedure diagnosis of Wound #1 is a Pressure Ulcer located on the Left Calcaneus . There was a Excisional Skin/Subcutaneous Tissue Debridement with a total area of 0.2 sq cm performed by Ricard Dillon, MD. With the following instrument(s): Curette to remove Viable and Non-Viable tissue/material. Material removed includes Subcutaneous Tissue and Slough and. No specimens were taken. A time out was conducted at 15:14, prior to the start of the procedure. A Minimum amount of bleeding was controlled with Pressure. The procedure was tolerated well. Post Debridement Measurements: 0.5cm length x 0.4cm width x 0.2cm depth; 0.031cm^3 volume. Post debridement Stage noted as Unstageable/Unclassified. Character of Wound/Ulcer Post Debridement is stable. Post procedure Diagnosis Wound #1: Same as Pre-Procedure Plan Follow-up Appointments: Return Appointment in 2 weeks. Bathing/ Shower/ Hygiene: May shower; gently cleanse wound with antibacterial soap, rinse and pat dry prior to dressing wounds Off-Loading: Other: - Float heels to keep pressure off of them. Additional Orders / Instructions: Follow Nutritious Diet and Increase Protein Intake WOUND #1: - Calcaneus Wound Laterality: Left Secondary Dressing: Gauze 3 x Per Day/30 Days Discharge Instructions: As directed: dry, moistened with saline or moistened with Dakins Solution Secured With: 108M Medipore H Soft Cloth Surgical Tape, 2x2 (in/yd) 3 x Per Day/30 Days Secured With: Kerlix Roll Sterile or Non-Sterile 6-ply 4.5x4 (yd/yd) 3 x Per Day/30 Days Discharge Instructions: Apply Kerlix as directed WOUND #2: - Lower Leg Wound Laterality: Right, Midline Primary  Dressing: Silvercel Small 2x2 (in/in) 3 x Per Day/30 Days Discharge Instructions: Apply Silvercel Small 2x2 (in/in) as instructed Secondary Dressing: Gauze 3 x Per Day/30 Days Discharge Instructions: As directed: dry, moistened with saline or moistened with Dakins Solution Secured With: 108M Medipore H Soft Cloth Surgical Tape, 2x2 (in/yd) 3 x Per Day/30 Days Secured With: Kerlix Roll Sterile or Non-Sterile 6-ply 4.5x4 (yd/yd) 3 x Per Day/30 Days Discharge Instructions: Apply Kerlix as directed 1. I am going to use silver alginate to the tip of the heel hopefully to close this over 2. We defined the area on the right anterior lower leg superiorly. This is been a chronic wound for many months. The reason this would not be healing is not intuitively obvious. I am wondering whether this will need a biopsy if this does not proceed to closure with adequate dressing. We will put silver alginate on this area as well Taylor Castaneda, Taylor Castaneda ([id number]) Electronic Signature(s) Signed: 10/12/2020 4:52:48 PM By: Linton Ham MD Entered By: Linton Ham on 10/12/2020 15:22:30 Taylor Castaneda ([id number]) -------------------------------------------------------------------------------- SuperBill Details Patient Name: Taylor Castaneda Date of Service: 10/12/2020 Medical Record Number: [id number] Patient Account Number: [id number] Date of Birth/Sex: [birth year] (88 y.o. F) Treating RN: Cornell Barman Primary Care Provider: [name] Other Clinician: Referring Provider: [name] Treating Provider/Extender: Tito Dine in Treatment: 10 Diagnosis Coding ICD-10 Codes Code Description E11.621 Type 2 diabetes mellitus with foot ulcer L89.620 Pressure ulcer of left heel, unstageable E11.51 Type 2 diabetes mellitus with diabetic peripheral angiopathy without gangrene L97.811 Non-pressure chronic ulcer of other part of right lower leg limited to breakdown of skin Facility Procedures CPT4  Code: [id number] Description: [phone] - WOUND CARE VISIT-LEV 2 EST PT Modifier: Quantity: 1 CPT4 Code: [id number] Description: 11042 - DEB SUBQ TISSUE 20 SQ CM/< Modifier: Quantity: 1 CPT4 Code: Description: ICD-10 Diagnosis Description E11.621 Type 2  diabetes mellitus with foot ulcer L89.620 Pressure ulcer of left heel, unstageable Modifier: Quantity: Physician Procedures CPT4 Code: [id number] Description: 59747 - WC PHYS SUBQ TISS 20 SQ CM Modifier: Quantity: 1 CPT4 Code: Description: ICD-10 Diagnosis Description E11.621 Type 2 diabetes mellitus with foot ulcer L89.620 Pressure ulcer of left heel, unstageable Modifier: Quantity: Notes New wound different etiology, different location. Electronic Signature(s) Signed: 10/12/2020 4:52:48 PM By: Linton Ham MD Entered By: Linton Ham on 10/12/2020 15:22:48

## 2020-10-21 ENCOUNTER — Other Ambulatory Visit: Payer: Self-pay

## 2020-10-21 ENCOUNTER — Ambulatory Visit (INDEPENDENT_AMBULATORY_CARE_PROVIDER_SITE_OTHER): Payer: Medicare Other | Admitting: Family Medicine

## 2020-10-21 ENCOUNTER — Encounter: Payer: Self-pay | Admitting: Family Medicine

## 2020-10-21 VITALS — BP 129/61 | HR 67 | Temp 97.8°F | Ht 60.0 in | Wt 103.0 lb

## 2020-10-21 DIAGNOSIS — I10 Essential (primary) hypertension: Secondary | ICD-10-CM | POA: Diagnosis not present

## 2020-10-21 DIAGNOSIS — E118 Type 2 diabetes mellitus with unspecified complications: Secondary | ICD-10-CM

## 2020-10-21 DIAGNOSIS — K922 Gastrointestinal hemorrhage, unspecified: Secondary | ICD-10-CM

## 2020-10-21 DIAGNOSIS — G3184 Mild cognitive impairment, so stated: Secondary | ICD-10-CM | POA: Diagnosis not present

## 2020-10-21 DIAGNOSIS — Z8673 Personal history of transient ischemic attack (TIA), and cerebral infarction without residual deficits: Secondary | ICD-10-CM

## 2020-10-21 DIAGNOSIS — L89620 Pressure ulcer of left heel, unstageable: Secondary | ICD-10-CM

## 2020-10-21 DIAGNOSIS — Z794 Long term (current) use of insulin: Secondary | ICD-10-CM

## 2020-10-21 NOTE — Progress Notes (Signed)
Patient ID: Taylor Castaneda, female    DOB: [birth year], 87 y.o.   MRN: [id number]  This visit was conducted in person.  BP 129/61 Comment: with home cuff this morning  Pulse 67   Temp 97.8 F (36.6 C) (Temporal)   Ht 5' (1.524 m)   Wt 103 lb (46.7 kg)   SpO2 98%   BMI 20.12 kg/m    158/60 on my recheck 129/61 this morning with home cuff  CC: HTN f/u visit  Subjective:   HPI: Taylor Castaneda is a 87 y.o. female presenting on 10/21/2020 for Hypertension (Here for 2 mo f/u.  Pt accompanied by daughter, [name]- temp 97.8.)   Recent R MCM ACA watershed infarct treated with DAPT through 09/02/2020, now only on plavix.   HTN - Compliant with current antihypertensive regimen of carvedilol 12.5mg  bid, losartan 50mg  daily, amlodipine 5mg . Does check blood pressures at home and brings log: this week 119-156/60-70 - overall well controlled based on home log. No low blood pressure readings or symptoms of dizziness/syncope.  Denies HA, vision changes, CP/tightness, SOB, leg swelling.    Recently saw endocrinology - continues 8u lantus at night time with latest a1c ~6.2%  Few episodes of increased agitation/anxiety over concern family was keeping secrets.   Ongoing low appetite. Drinks protein supplement (30g protein, 1 g sugar).      Relevant past medical, surgical, family and social history reviewed and updated as indicated. Interim medical history since our last visit reviewed. Allergies and medications reviewed and updated. Outpatient Medications Prior to Visit  Medication Sig Dispense Refill  . acetaminophen (TYLENOL) 325 MG tablet Take 325-650 mg by mouth every 6 (six) hours as needed (for pain).    Marland Kitchen amLODipine (NORVASC) 5 MG tablet Take 1 tablet (5 mg total) by mouth daily. 30 tablet 6  . atorvastatin (LIPITOR) 40 MG tablet Take 1 tablet (40 mg total) by mouth daily at 6 PM. 90 tablet 3  . Biotin 5000 MCG TABS Take by mouth.    . Carboxymethylcellul-Glycerin 1-0.25 % SOLN Place 1-2  drops into both eyes 3 (three) times daily as needed (for dry/irritated eyes.).    Marland Kitchen carvedilol (COREG) 12.5 MG tablet Take 1 tablet (12.5 mg total) by mouth 2 (two) times daily. 180 tablet 3  . Cholecalciferol (VITAMIN D3) 25 MCG (1000 UT) CAPS Take 1 capsule (1,000 Units total) by mouth daily. 30 capsule 0  . Cinnamon 500 MG capsule Take 1,000 mg by mouth daily.    . clopidogrel (PLAVIX) 75 MG tablet Take 1 tablet (75 mg total) by mouth daily. 90 tablet 4  . colchicine 0.6 MG tablet Take 1 tablet (0.6 mg total) by mouth daily as needed (gout flare). First day of gout flare, may take 1 tablet twice daily. 30 tablet 3  . CONTOUR NEXT TEST test strip 1 each 4 (four) times daily.    . diclofenac Sodium (VOLTAREN) 1 % GEL Apply 2 g topically 4 (four) times daily. (Patient taking differently: Apply 2 g topically 4 (four) times daily. As needed) 2 g 0  . Ferrous Sulfate (IRON) 325 (65 Fe) MG TABS Take 1 tablet (325 mg total) by mouth See admin instructions. Take 325 mg by mouth in the evening on Mondays and Thursdays only 30 tablet 0  . insulin glargine (LANTUS) 100 UNIT/ML Solostar Pen Inject 8 Units into the skin daily. 15 mL 11  . levETIRAcetam (KEPPRA) 750 MG tablet Take 1 tablet (750 mg total) by mouth  2 (two) times daily. 180 tablet 4  . losartan (COZAAR) 50 MG tablet Take 1 tablet (50 mg total) by mouth daily. 30 tablet 6  . Multiple Vitamins-Minerals (PRESERVISION AREDS 2 PO) Take 1 capsule by mouth 2 (two) times daily.    Marland Kitchen nystatin cream (MYCOSTATIN) Apply 1 application topically 2 (two) times daily. 80 g 0  . pantoprazole (PROTONIX) 40 MG tablet Take 1 tablet (40 mg total) by mouth daily. 30 tablet 3  . aspirin EC 325 MG EC tablet Take 1 tablet (325 mg total) by mouth daily. 30 tablet 0  . collagenase (SANTYL) ointment Apply 1 application topically daily.     No facility-administered medications prior to visit.     Per HPI unless specifically indicated in ROS section below Review of  Systems Objective:  BP 129/61 Comment: with home cuff this morning  Pulse 67   Temp 97.8 F (36.6 C) (Temporal)   Ht 5' (1.524 m)   Wt 103 lb (46.7 kg)   SpO2 98%   BMI 20.12 kg/m   Wt Readings from Last 3 Encounters:  10/21/20 103 lb (46.7 kg)  08/19/20 106 lb 3 oz (48.2 kg)  08/15/20 106 lb 12.8 oz (48.4 kg)      Physical Exam Vitals and nursing note reviewed.  Constitutional:      Appearance: Normal appearance. She is not ill-appearing.     Comments: Ambulates with walker  Cardiovascular:     Rate and Rhythm: Normal rate and regular rhythm.     Pulses: Normal pulses.     Heart sounds: Normal heart sounds. No murmur heard.   Pulmonary:     Effort: Pulmonary effort is normal. No respiratory distress.     Breath sounds: Normal breath sounds. No wheezing, rhonchi or rales.  Musculoskeletal:     Right lower leg: No edema.     Left lower leg: No edema.  Skin:    General: Skin is warm and dry.     Findings: No erythema or rash.  Neurological:     Mental Status: She is alert.  Psychiatric:        Mood and Affect: Mood normal.        Behavior: Behavior normal.       Results for orders placed or performed in visit on 86/57/84  Basic metabolic panel  Result Value Ref Range   Glucose, Bld 157 (H) 65 - 99 mg/dL   BUN 41 (H) 7 - 25 mg/dL   Creat 1.01 (H) 0.60 - 0.88 mg/dL   BUN/Creatinine Ratio 41 (H) 6 - 22 (calc)   Sodium 140 135 - 146 mmol/L   Potassium 4.1 3.5 - 5.3 mmol/L   Chloride 109 98 - 110 mmol/L   CO2 18 (L) 20 - 32 mmol/L   Calcium 8.7 8.6 - 10.4 mg/dL   Assessment & Plan:  This visit occurred during the SARS-CoV-2 public health emergency.  Safety protocols were in place, including screening questions prior to the visit, additional usage of staff PPE, and extensive cleaning of exam room while observing appropriate contact time as indicated for disinfecting solutions.   Problem List Items Addressed This Visit    Controlled diabetes mellitus type 2 with  complications ([place])    Continues seeing endo - great control to date based on latest A1c.       Essential hypertension - Primary    BP remaining elevated in office however improving readings based on home log they bring - checking BP several  times a day. No changes made today, continue current regimen, continue monitoring blood pressures at home. Reassess when she returns for physical.       History of subcortical infarction 07/2020    Has completed DAPT course, now only on plavix.       MCI (mild cognitive impairment)    Progressive since latest stroke 08/2020 - will need to further assess.  Noting some increased agitation/anxiety - discussed reorientation techniques, may consider hydroxyzine if not improving.       Pressure injury of left heel, unstageable ([place])    Followed by wound clinic - significant improvement noted.       GI bleed    H/o this - continue daily PPI.           No orders of the defined types were placed in this encounter.  No orders of the defined types were placed in this encounter.   Patient Instructions  You are doing well on current regimen - continue this.  Return in 4 months for medicare wellness visit/physical.  Good to see you today.   Follow up plan: Return in about 4 months (around 02/21/2021) for medicare wellness visit, annual exam, prior fasting for blood work.  [name], MD

## 2020-10-21 NOTE — Patient Instructions (Addendum)
You are doing well on current regimen - continue this.  Return in 4 months for medicare wellness visit/physical.  Good to see you today.

## 2020-10-22 NOTE — Assessment & Plan Note (Signed)
H/o this - continue daily PPI.

## 2020-10-22 NOTE — Assessment & Plan Note (Signed)
Has completed DAPT course, now only on plavix.

## 2020-10-22 NOTE — Assessment & Plan Note (Signed)
BP remaining elevated in office however improving readings based on home log they bring - checking BP several times a day. No changes made today, continue current regimen, continue monitoring blood pressures at home. Reassess when she returns for physical.

## 2020-10-22 NOTE — Assessment & Plan Note (Signed)
Continues seeing endo - great control to date based on latest A1c.

## 2020-10-22 NOTE — Assessment & Plan Note (Signed)
Progressive since latest stroke 08/2020 - will need to further assess.  Noting some increased agitation/anxiety - discussed reorientation techniques, may consider hydroxyzine if not improving.

## 2020-10-22 NOTE — Assessment & Plan Note (Addendum)
Followed by wound clinic - significant improvement noted.

## 2020-10-26 ENCOUNTER — Other Ambulatory Visit: Payer: Self-pay

## 2020-10-26 ENCOUNTER — Encounter: Payer: Medicare Other | Admitting: Internal Medicine

## 2020-10-26 DIAGNOSIS — E1122 Type 2 diabetes mellitus with diabetic chronic kidney disease: Secondary | ICD-10-CM | POA: Diagnosis not present

## 2020-10-26 DIAGNOSIS — L97811 Non-pressure chronic ulcer of other part of right lower leg limited to breakdown of skin: Secondary | ICD-10-CM | POA: Diagnosis not present

## 2020-10-26 DIAGNOSIS — L89620 Pressure ulcer of left heel, unstageable: Secondary | ICD-10-CM | POA: Diagnosis not present

## 2020-10-26 DIAGNOSIS — N1832 Chronic kidney disease, stage 3b: Secondary | ICD-10-CM | POA: Diagnosis not present

## 2020-10-26 DIAGNOSIS — I6930 Unspecified sequelae of cerebral infarction: Secondary | ICD-10-CM | POA: Diagnosis not present

## 2020-10-26 DIAGNOSIS — E11621 Type 2 diabetes mellitus with foot ulcer: Secondary | ICD-10-CM | POA: Diagnosis not present

## 2020-10-26 DIAGNOSIS — E1151 Type 2 diabetes mellitus with diabetic peripheral angiopathy without gangrene: Secondary | ICD-10-CM | POA: Diagnosis not present

## 2020-10-26 DIAGNOSIS — I129 Hypertensive chronic kidney disease with stage 1 through stage 4 chronic kidney disease, or unspecified chronic kidney disease: Secondary | ICD-10-CM | POA: Diagnosis not present

## 2020-10-27 NOTE — Progress Notes (Signed)
DALIA, JOLLIE ([id number]) Visit Report for 10/26/2020 HPI Details Patient Name: Taylor Castaneda, Taylor Castaneda. Date of Service: 10/26/2020 3:45 PM Medical Record Number: [id number] Patient Account Number: [id number] Date of Birth/Sex: [birth year] (88 y.o. F) Treating RN: Primary Care Provider: [name] Other Clinician: Referring Provider: [name] Treating Provider/Extender: Tito Dine in Treatment: 12 History of Present Illness HPI Description: ADMISSION 08/03/2020 This is a 86 year old woman who was in the hospital last month suffering a right CVA with mild residual left-sided symptoms. Sometime during this hospitalization or shortly after she got home they noted that she had an ulcer on her left heel. Saw her primary doctor on 2/25 who correctly pointed out this was unstageable and referred her here. Patient does not complain of any pain. She has home health [[facility]] but we are not exactly sure what they are currently using for wound dressing. Past medical history includes hypertension, type 2 diabetes, stage IIIb chronic renal failure, recent right CVA is noted, laparoscopic cholecystectomy also last month, gout, seizures ABI in our clinic was 0.71 on the left 08/10/2020 upon evaluation today patient appears to be doing well with regard to her heel ulcer in my opinion. Fortunately there does not appear to be any signs of active infection at this time. No fever chills noted She does have an appointment or rather referral to get an appointment with vascular hopefully we can get that scheduled soon they called and left a message that has not been set up yet though were given the information for them to call the care of that today. 3/23; using Santyl on the pressure ulcer on her left heel. She has had a thorough vascular evaluation. Noncompressible bilaterally however also with monophasic waveforms and low TBI's with a TBI in the right of 0.38 and on the left 0.36. She was seen  by vascular. They felt this was a pressure ulcer I think most everybody agrees with this. They also had a long discussion with the patient's son with regards to her PAD. She has diminished waveforms in the posterior tibial artery which is the vessel that perfuses her heel. Nevertheless I think the agreement was a period of watchful waiting to see if Santyl and aggressive wound care can heal this area. The more aggressive approach of course is an angiogram. I talked to the patient about this today with her son they are indeed in favor of a period of watchful waiting to see if wound care can get this area to close. She does not describe claudication with her minimal activity or at rest 3/30; using Santyl on her pressure ulcer on the tip of her heel. She also has PAD but we have been managing this conservatively [see my note above]. 4/6; still using Santyl. We are making improvements in surface area. This was initially unstageable but is down to the muscle layer. 4/13; still using Santyl. Continued nice improvements in surface area. Also the depth of the wound is improved 4/27; still using Santyl. Continues with dramatic improvement in surface area the depth of the wound also improved. No evidence of surrounding infection 5/11; the patient's area on the left heel is just about closed. I removed some skin and subcutaneous debris only a tiny open area remains underneath this. In passing I noted a bandage on the right upper mid tibia. Underneath this is a pale somewhat lifeless looking wound which the patient's son's been says has been there for now at least 6 months. It will scab over and  then reopened and then scab over then reopened. Have been recently been using the Santyl for the left heel on it. 5/25; the patient's area on the heel is completely epithelialized. Healthy looking. o The area on the right upper tibia is a pale somewhat lifeless looking area raised with some subcutaneous firmness  however it is closed over now. The patient tells me that its been there for 10 years Electronic Signature(s) Signed: 10/27/2020 8:39:06 AM By: Linton Ham MD Entered By: Linton Ham on 10/26/2020 16:48:05 Taylor Castaneda ([id number]) -------------------------------------------------------------------------------- Physical Exam Details Patient Name: Taylor Castaneda Date of Service: 10/26/2020 3:45 PM Medical Record Number: [id number] Patient Account Number: [id number] Date of Birth/Sex: 01-24-1933 (88 y.o. F) Treating RN: Primary Care Provider: [name] Other Clinician: Referring Provider: [name] Treating Provider/Extender: Tito Dine in Treatment: 12 Constitutional Patient is hypertensive.. Pulse regular and within target range for patient.Marland Kitchen Respirations regular, non-labored and within target range.. Temperature is normal and within the target range for the patient.Marland Kitchen appears in no distress. Notes Wound exam; the patient's area on the left heel tip is completely closed there is no open area here. The skin is right over bone however this is going to be an issue she is going to have to be careful about going forward o The area we identified on the right upper mid tibia last time is the raised soft pale wound there is some subcutaneous involvement here. I be somewhat concerned that this might be an underlying cancer especially if it expands or grows in any way. There is no color variation. Electronic Signature(s) Signed: 10/27/2020 8:39:06 AM By: Linton Ham MD Entered By: Linton Ham on 10/26/2020 16:49:35 Taylor Castaneda ([id number]) -------------------------------------------------------------------------------- Physician Orders Details Patient Name: Taylor Castaneda Date of Service: 10/26/2020 3:45 PM Medical Record Number: [id number] Patient Account Number: [id number] Date of Birth/Sex: [birth year] (88 y.o. F) Treating RN: Carlene Coria Primary Care Provider: [name] Other Clinician: Referring Provider: [name] Treating Provider/Extender: Tito Dine in Treatment: 12 Verbal / Phone Orders: No Diagnosis Coding Discharge From [facility] Services o Discharge from [place] Treatment Complete Wound Treatment Electronic Signature(s) Signed: 10/26/2020 4:53:29 PM By: Carlene Coria RN Signed: 10/27/2020 8:39:06 AM By: Linton Ham MD Entered By: Carlene Coria on 10/26/2020 16:45:44 Taylor Castaneda ([id number]) -------------------------------------------------------------------------------- Problem List Details Patient Name: Taylor Castaneda, Taylor Castaneda Date of Service: 10/26/2020 3:45 PM Medical Record Number: [id number] Patient Account Number: [id number] Date of Birth/Sex: 1932-12-28 (88 y.o. F) Treating RN: Primary Care Provider: [name] Other Clinician: Referring Provider: [name] Treating Provider/Extender: Tito Dine in Treatment: 12 Active Problems ICD-10 Encounter Code Description Active Date MDM Diagnosis E11.621 Type 2 diabetes mellitus with foot ulcer 08/03/2020 No Yes L89.620 Pressure ulcer of left heel, unstageable 08/03/2020 No Yes E11.51 Type 2 diabetes mellitus with diabetic peripheral angiopathy without 08/03/2020 No Yes gangrene L97.811 Non-pressure chronic ulcer of other part of right lower leg limited to 10/12/2020 No Yes breakdown of skin Inactive Problems Resolved Problems Electronic Signature(s) Signed: 10/27/2020 8:39:06 AM By: Linton Ham MD Entered By: Linton Ham on 10/26/2020 16:46:50 Taylor Castaneda ([id number]) -------------------------------------------------------------------------------- Progress Note Details Patient Name: Taylor Castaneda Date of Service: 10/26/2020 3:45 PM Medical Record Number: [id number] Patient Account Number: [id number] Date of Birth/Sex: 02-Mar-1933 (88 y.o. F) Treating RN: Primary Care Provider:  [name] Other Clinician: Referring Provider: [name] Treating Provider/Extender: Tito Dine in Treatment: 12 Subjective History of Present Illness (HPI) ADMISSION  08/03/2020 This is a 87 year old woman who was in the hospital last month suffering a right CVA with mild residual left-sided symptoms. Sometime during this hospitalization or shortly after she got home they noted that she had an ulcer on her left heel. Saw her primary doctor on 2/25 who correctly pointed out this was unstageable and referred her here. Patient does not complain of any pain. She has home health [[facility]] but we are not exactly sure what they are currently using for wound dressing. Past medical history includes hypertension, type 2 diabetes, stage IIIb chronic renal failure, recent right CVA is noted, laparoscopic cholecystectomy also last month, gout, seizures ABI in our clinic was 0.71 on the left 08/10/2020 upon evaluation today patient appears to be doing well with regard to her heel ulcer in my opinion. Fortunately there does not appear to be any signs of active infection at this time. No fever chills noted She does have an appointment or rather referral to get an appointment with vascular hopefully we can get that scheduled soon they called and left a message that has not been set up yet though were given the information for them to call the care of that today. 3/23; using Santyl on the pressure ulcer on her left heel. She has had a thorough vascular evaluation. Noncompressible bilaterally however also with monophasic waveforms and low TBI's with a TBI in the right of 0.38 and on the left 0.36. She was seen by vascular. They felt this was a pressure ulcer I think most everybody agrees with this. They also had a long discussion with the patient's son with regards to her PAD. She has diminished waveforms in the posterior tibial artery which is the vessel that perfuses her heel.  Nevertheless I think the agreement was a period of watchful waiting to see if Santyl and aggressive wound care can heal this area. The more aggressive approach of course is an angiogram. I talked to the patient about this today with her son they are indeed in favor of a period of watchful waiting to see if wound care can get this area to close. She does not describe claudication with her minimal activity or at rest 3/30; using Santyl on her pressure ulcer on the tip of her heel. She also has PAD but we have been managing this conservatively [see my note above]. 4/6; still using Santyl. We are making improvements in surface area. This was initially unstageable but is down to the muscle layer. 4/13; still using Santyl. Continued nice improvements in surface area. Also the depth of the wound is improved 4/27; still using Santyl. Continues with dramatic improvement in surface area the depth of the wound also improved. No evidence of surrounding infection 5/11; the patient's area on the left heel is just about closed. I removed some skin and subcutaneous debris only a tiny open area remains underneath this. In passing I noted a bandage on the right upper mid tibia. Underneath this is a pale somewhat lifeless looking wound which the patient's son's been says has been there for now at least 6 months. It will scab over and then reopened and then scab over then reopened. Have been recently been using the Santyl for the left heel on it. 5/25; the patient's area on the heel is completely epithelialized. Healthy looking. The area on the right upper tibia is a pale somewhat lifeless looking area raised with some subcutaneous firmness however it is closed over now. The patient tells me that its been  there for 10 years Objective Constitutional Patient is hypertensive.. Pulse regular and within target range for patient.Marland Kitchen Respirations regular, non-labored and within target range.. Temperature is normal and  within the target range for the patient.Marland Kitchen appears in no distress. Vitals Time Taken: 4:05 PM, Temperature: 98.1 F, Pulse: 66 bpm, Respiratory Rate: 16 breaths/min, Blood Pressure: 168/68 mmHg. General Notes: Wound exam; the patient's area on the left heel tip is completely closed there is no open area here. The skin is right over bone however this is going to be an issue she is going to have to be careful about going forward The area we identified on the right upper mid tibia last time is the raised soft pale wound there is some subcutaneous involvement here. I be somewhat concerned that this might be an underlying cancer especially if it expands or grows in any way. There is no color variation. Taylor Castaneda, Taylor Castaneda ([id number]) Integumentary (Hair, Skin) Wound #1 status is Open. Original cause of wound was Gradually Appeared. The date acquired was: 06/22/2020. The wound has been in treatment 12 weeks. The wound is located on the Left Calcaneus. The wound measures 0.1cm length x 0.1cm width x 0.1cm depth; 0.008cm^2 area and 0.001cm^3 volume. There is no tunneling or undermining noted. There is a none present amount of drainage noted. There is large (67-100%) pink granulation within the wound bed. There is no necrotic tissue within the wound bed. Wound #2 status is Open. Original cause of wound was Gradually Appeared. The date acquired was: 09/12/2020. The wound has been in treatment 2 weeks. The wound is located on the Right,Midline Lower Leg. The wound measures 0.1cm length x 0.1cm width x 0.1cm depth; 0.008cm^2 area and 0.001cm^3 volume. There is no tunneling or undermining noted. There is a none present amount of drainage noted. There is large (67- 100%) pink granulation within the wound bed. There is no necrotic tissue within the wound bed. Assessment Active Problems ICD-10 Type 2 diabetes mellitus with foot ulcer Pressure ulcer of left heel, unstageable Type 2 diabetes mellitus with diabetic  peripheral angiopathy without gangrene Non-pressure chronic ulcer of other part of right lower leg limited to breakdown of skin Plan Discharge From [facility] Services: Discharge from [place] Treatment Complete 1. Patient to be discharged from the clinic 2. Going to pad this area going forward and we went over this. Special care with the heel of her shoe, wear this rest in bed at night etc. 3. The area on her right anterior lower leg she says has been chronic and unchanged for years although I am not quite certain about that I told her that if this changes either in height or expands then she may need to see dermatology. Electronic Signature(s) Signed: 10/27/2020 8:39:06 AM By: Linton Ham MD Entered By: Linton Ham on 10/26/2020 16:50:36 Murdoch, [name] ([id number]) -------------------------------------------------------------------------------- SuperBill Details Patient Name: Taylor Castaneda Date of Service: 10/26/2020 Medical Record Number: [id number] Patient Account Number: [id number] Date of Birth/Sex: 04-Feb-1933 (88 y.o. F) Treating RN: Carlene Coria Primary Care Provider: [name] Other Clinician: Referring Provider: [name] Treating Provider/Extender: Tito Dine in Treatment: 12 Diagnosis Coding ICD-10 Codes Code Description E11.621 Type 2 diabetes mellitus with foot ulcer L89.620 Pressure ulcer of left heel, unstageable E11.51 Type 2 diabetes mellitus with diabetic peripheral angiopathy without gangrene L97.811 Non-pressure chronic ulcer of other part of right lower leg limited to breakdown of skin Facility Procedures CPT4 Code: [id number] Description: 02409 - WOUND CARE VISIT-LEV 2  EST PT Modifier: Quantity: 1 Physician Procedures CPT4 Code: 0814481 Description: 85631 - WC PHYS LEVEL 2 - EST PT Modifier: Quantity: 1 CPT4 Code: Description: ICD-10 Diagnosis Description L97.811 Non-pressure chronic ulcer of other part of right  lower leg limited to bre L89.620 Pressure ulcer of left heel, unstageable Modifier: akdown of skin Quantity: Electronic Signature(s) Signed: 10/27/2020 8:39:06 AM By: Linton Ham MD Entered By: Linton Ham on 10/26/2020 16:50:56

## 2020-10-27 NOTE — Progress Notes (Signed)
Taylor Castaneda, Taylor Castaneda ([id number]) Visit Report for 10/26/2020 Arrival Information Details Patient Name: Taylor Castaneda, Taylor Castaneda. Date of Service: 10/26/2020 3:45 PM Medical Record Number: [id number] Patient Account Number: [id number] Date of Birth/Sex: 09/27/1932 (87 y.o. F) Treating RN: Primary Care Adlai Sinning: [name] Other Clinician: Referring Jru Pense: [name] Treating Greenley Martone/Extender: Tito Dine in Treatment: 12 Visit Information History Since Last Visit Added or deleted any medications: No Patient Arrived: Wheel Chair Had a fall or experienced change in No Arrival Time: 16:05 activities of daily living that may affect Accompanied By: son risk of falls: Transfer Assistance: EasyPivot Patient Lift Hospitalized since last visit: No Patient Requires Transmission-Based No Pain Present Now: No Precautions: Patient Has Alerts: Yes Patient Alerts: Patient on Blood Thinner Type I Diabetic Electronic Signature(s) Signed: 10/27/2020 4:37:07 PM By: Jeanine Luz Entered By: Jeanine Luz on 10/26/2020 16:06:41 Taylor Castaneda ([id number]) -------------------------------------------------------------------------------- Clinic Level of Care Assessment Details Patient Name: Taylor Castaneda Date of Service: 10/26/2020 3:45 PM Medical Record Number: [id number] Patient Account Number: [id number] Date of Birth/Sex: 02-02-1933 (87 y.o. F) Treating RN: Carlene Coria Primary Care Barnett Elzey: [name] Other Clinician: Referring Depaul Arizpe: [name] Treating Arista Kettlewell/Extender: Tito Dine in Treatment: 12 Clinic Level of Care Assessment Items TOOL 4 Quantity Score X - Use when only an EandM is performed on FOLLOW-UP visit 1 0 ASSESSMENTS - Nursing Assessment / Reassessment X - Reassessment of Co-morbidities (includes updates in patient status) 1 10 X- 1 5 Reassessment of Adherence to Treatment Plan ASSESSMENTS - Wound and Skin Assessment /  Reassessment X - Simple Wound Assessment / Reassessment - one wound 1 5 []  - 0 Complex Wound Assessment / Reassessment - multiple wounds []  - 0 Dermatologic / Skin Assessment (not related to wound area) ASSESSMENTS - Focused Assessment []  - Circumferential Edema Measurements - multi extremities 0 []  - 0 Nutritional Assessment / Counseling / Intervention []  - 0 Lower Extremity Assessment (monofilament, tuning fork, pulses) []  - 0 Peripheral Arterial Disease Assessment (using hand held doppler) ASSESSMENTS - Ostomy and/or Continence Assessment and Care []  - Incontinence Assessment and Management 0 []  - 0 Ostomy Care Assessment and Management (repouching, etc.) PROCESS - Coordination of Care X - Simple Patient / Family Education for ongoing care 1 15 []  - 0 Complex (extensive) Patient / Family Education for ongoing care []  - 0 Staff obtains Programmer, systems, Records, Test Results / Process Orders []  - 0 Staff telephones HHA, Nursing Homes / Clarify orders / etc []  - 0 Routine Transfer to another Facility (non-emergent condition) []  - 0 Routine Hospital Admission (non-emergent condition) []  - 0 New Admissions / Biomedical engineer / Ordering NPWT, Apligraf, etc. []  - 0 Emergency Hospital Admission (emergent condition) X- 1 10 Simple Discharge Coordination []  - 0 Complex (extensive) Discharge Coordination PROCESS - Special Needs []  - Pediatric / Minor Patient Management 0 []  - 0 Isolation Patient Management []  - 0 Hearing / Language / Visual special needs []  - 0 Assessment of Community assistance (transportation, D/C planning, etc.) []  - 0 Additional assistance / Altered mentation []  - 0 Support Surface(s) Assessment (bed, cushion, seat, etc.) INTERVENTIONS - Wound Cleansing / Measurement Taylor Castaneda, Taylor B. ([id number]) X- 1 5 Simple Wound Cleansing - one wound []  - 0 Complex Wound Cleansing - multiple wounds X- 1 5 Wound Imaging (photographs - any number of wounds) []   - 0 Wound Tracing (instead of photographs) X- 1 5 Simple Wound Measurement - one wound []  - 0 Complex Wound Measurement - multiple wounds  INTERVENTIONS - Wound Dressings []  - Small Wound Dressing one or multiple wounds 0 []  - 0 Medium Wound Dressing one or multiple wounds []  - 0 Large Wound Dressing one or multiple wounds []  - 0 Application of Medications - topical []  - 0 Application of Medications - injection INTERVENTIONS - Miscellaneous []  - External ear exam 0 []  - 0 Specimen Collection (cultures, biopsies, blood, body fluids, etc.) []  - 0 Specimen(s) / Culture(s) sent or taken to Lab for analysis []  - 0 Patient Transfer (multiple staff / Harrel Lemon Lift / Similar devices) []  - 0 Simple Staple / Suture removal (25 or less) []  - 0 Complex Staple / Suture removal (26 or more) []  - 0 Hypo / Hyperglycemic Management (close monitor of Blood Glucose) []  - 0 Ankle / Brachial Index (ABI) - do not check if billed separately X- 1 5 Vital Signs Has the patient been seen at the hospital within the last three years: Yes Total Score: 65 Level Of Care: New/Established - Level 2 Electronic Signature(s) Signed: 10/26/2020 4:53:29 PM By: Carlene Coria RN Entered By: Carlene Coria on 10/26/2020 16:48:47 Taylor Castaneda ([id number]) -------------------------------------------------------------------------------- Encounter Discharge Information Details Patient Name: Taylor Castaneda Date of Service: 10/26/2020 3:45 PM Medical Record Number: [id number] Patient Account Number: [id number] Date of Birth/Sex: February 12, 1933 (87 y.o. F) Treating RN: Carlene Coria Primary Care Margarethe Virgen: [name] Other Clinician: Referring Aroura Vasudevan: [name] Treating Breonna Gafford/Extender: Tito Dine in Treatment: 12 Encounter Discharge Information Items Discharge Condition: Stable Ambulatory Status: Wheelchair Discharge Destination: Home Transportation: Private Auto Accompanied By:  self Schedule Follow-up Appointment: Yes Clinical Summary of Care: Patient Declined Electronic Signature(s) Signed: 10/26/2020 4:53:29 PM By: Carlene Coria RN Entered By: Carlene Coria on 10/26/2020 16:49:49 Taylor Castaneda ([id number]) -------------------------------------------------------------------------------- Lower Extremity Assessment Details Patient Name: Taylor Castaneda Date of Service: 10/26/2020 3:45 PM Medical Record Number: [id number] Patient Account Number: [id number] Date of Birth/Sex: 1932/11/03 (87 y.o. F) Treating RN: Primary Care Zackery Brine: [name] Other Clinician: Referring Melisssa Donner: [name] Treating Airen Stiehl/Extender: Tito Dine in Treatment: 12 Electronic Signature(s) Signed: 10/27/2020 4:37:07 PM By: Jeanine Luz Entered By: Jeanine Luz on 10/26/2020 16:19:23 Taylor Castaneda ([id number]) -------------------------------------------------------------------------------- Multi Wound Chart Details Patient Name: Taylor Castaneda Date of Service: 10/26/2020 3:45 PM Medical Record Number: [id number] Patient Account Number: [id number] Date of Birth/Sex: 02-Nov-1932 (87 y.o. F) Treating RN: Carlene Coria Primary Care Tailer Volkert: [name] Other Clinician: Referring Signora Zucco: [name] Treating Zabian Swayne/Extender: Tito Dine in Treatment: 12 Vital Signs Height(in): Pulse(bpm): 45 Weight(lbs): Blood Pressure(mmHg): 168/68 Body Mass Index(BMI): Temperature(F): 98.1 Respiratory Rate(breaths/min): 16 Photos: [N/A:N/A] Wound Location: Left Calcaneus Right, Midline Lower Leg N/A Wounding Event: Gradually Appeared Gradually Appeared N/A Primary Etiology: Pressure Ulcer Atypical N/A Comorbid History: Cataracts, Hypertension, Myocardial Cataracts, Hypertension, Myocardial N/A Infarction, Peripheral Arterial Infarction, Peripheral Arterial Disease, Type I Diabetes, End Disease, Type I Diabetes, End Stage Renal  Disease, Gout, Seizure Stage Renal Disease, Gout, Seizure Disorder Disorder Date Acquired: 06/22/2020 09/12/2020 N/A Weeks of Treatment: 12 2 N/A Wound Status: Open Open N/A Measurements L x W x D (cm) 0.1x0.1x0.1 0.1x0.1x0.1 N/A Area (cm) : 0.008 0.008 N/A Volume (cm) : 0.001 0.001 N/A % Reduction in Area: 99.80% 93.20% N/A % Reduction in Volume: 99.80% 91.70% N/A Classification: Unstageable/Unclassified Partial Thickness N/A Exudate Amount: None Present None Present N/A Granulation Amount: Large (67-100%) Large (67-100%) N/A Granulation Quality: Pink Pink N/A Necrotic Amount: None Present (0%) None Present (0%) N/A Exposed Structures: Fascia: No Fascia: No N/A  Fat Layer (Subcutaneous Tissue): Fat Layer (Subcutaneous Tissue): No No Tendon: No Tendon: No Muscle: No Muscle: No Joint: No Joint: No Bone: No Bone: No Epithelialization: Small (1-33%) N/A N/A Treatment Notes Electronic Signature(s) Signed: 10/27/2020 8:39:06 AM By: Linton Ham MD Entered By: Linton Ham on 10/26/2020 16:47:01 Taylor Castaneda, Taylor Castaneda ([id number]) -------------------------------------------------------------------------------- [place] Details Patient Name: Taylor Castaneda Date of Service: 10/26/2020 3:45 PM Medical Record Number: [id number] Patient Account Number: [id number] Date of Birth/Sex: 1932-10-17 (87 y.o. F) Treating RN: Carlene Coria Primary Care Cheryal Salas: [name] Other Clinician: Referring Muna Demers: [name] Treating Earland Reish/Extender: Tito Dine in Treatment: 12 Active Inactive Electronic Signature(s) Signed: 10/26/2020 4:53:29 PM By: Carlene Coria RN Entered By: Carlene Coria on 10/26/2020 16:43:10 Taylor Castaneda ([id number]) -------------------------------------------------------------------------------- Pain Assessment Details Patient Name: Taylor Castaneda Date of Service: 10/26/2020 3:45 PM Medical Record Number:  [id number] Patient Account Number: [id number] Date of Birth/Sex: Jan 03, 1933 (87 y.o. F) Treating RN: Primary Care Brilyn Tuller: [name] Other Clinician: Referring Victorya Hillman: [name] Treating Amir Fick/Extender: Tito Dine in Treatment: 12 Active Problems Location of Pain Severity and Description of Pain Patient Has Paino No Site Locations Rate the pain. Current Pain Level: 0 Pain Management and Medication Current Pain Management: Electronic Signature(s) Signed: 10/27/2020 4:37:07 PM By: Jeanine Luz Entered By: Jeanine Luz on 10/26/2020 16:10:06 Taylor Castaneda ([id number]) -------------------------------------------------------------------------------- Patient/Caregiver Education Details Patient Name: Taylor Castaneda Date of Service: 10/26/2020 3:45 PM Medical Record Number: [id number] Patient Account Number: [id number] Date of Birth/Gender: 1932/11/30 (87 y.o. F) Treating RN: Carlene Coria Primary Care Physician: [name] Other Clinician: Referring Physician: [name] Treating Physician/Extender: Tito Dine in Treatment: 12 Education Assessment Education Provided To: Patient Education Topics Provided Wound/Skin Impairment: Methods: Explain/Verbal Responses: State content correctly Electronic Signature(s) Signed: 10/26/2020 4:53:29 PM By: Carlene Coria RN Entered By: Carlene Coria on 10/26/2020 16:49:05 Taylor Castaneda ([id number]) -------------------------------------------------------------------------------- Wound Assessment Details Patient Name: Taylor Castaneda Date of Service: 10/26/2020 3:45 PM Medical Record Number: [id number] Patient Account Number: [id number] Date of Birth/Sex: 02-21-1933 (87 y.o. F) Treating RN: Carlene Coria Primary Care Uchenna Seufert: [name] Other Clinician: Referring Apolonio Cutting: [name] Treating Timika Muench/Extender: Tito Dine in Treatment: 12 Wound  Status Wound Number: 1 Primary Pressure Ulcer Etiology: Wound Location: Left Calcaneus Wound Open Wounding Event: Gradually Appeared Status: Date Acquired: 06/22/2020 Comorbid Cataracts, Hypertension, Myocardial Infarction, Peripheral Weeks Of Treatment: 12 History: Arterial Disease, Type I Diabetes, End Stage Renal Clustered Wound: No Disease, Gout, Seizure Disorder Photos Wound Measurements Length: (cm) 0 Width: (cm) 0 Depth: (cm) 0 Area: (cm) 0 Volume: (cm) 0 % Reduction in Area: 100% % Reduction in Volume: 100% Epithelialization: Large (67-100%) Tunneling: No Undermining: No Wound Description Classification: Unstageable/Unclassified Exudate Amount: None Present Foul Odor After Cleansing: No Slough/Fibrino No Wound Bed Granulation Amount: None Present (0%) Exposed Structure Necrotic Amount: None Present (0%) Fascia Exposed: No Fat Layer (Subcutaneous Tissue) Exposed: No Tendon Exposed: No Muscle Exposed: No Joint Exposed: No Bone Exposed: No Electronic Signature(s) Signed: 10/26/2020 4:53:29 PM By: Carlene Coria RN Entered By: Carlene Coria on 10/26/2020 16:50:26 Taylor Castaneda ([id number]) -------------------------------------------------------------------------------- Wound Assessment Details Patient Name: Taylor Castaneda Date of Service: 10/26/2020 3:45 PM Medical Record Number: [id number] Patient Account Number: [id number] Date of Birth/Sex: 20-Apr-1933 (87 y.o. F) Treating RN: Carlene Coria Primary Care Socorro Ebron: [name] Other Clinician: Referring Adrianna Dudas: [name] Treating Kipper Buch/Extender: Tito Dine in Treatment: 12 Wound Status Wound Number: 2 Primary Atypical Etiology: Wound  Location: Right, Midline Lower Leg Wound Open Wounding Event: Gradually Appeared Status: Date Acquired: 09/12/2020 Comorbid Cataracts, Hypertension, Myocardial Infarction, Peripheral Weeks Of Treatment: 2 History: Arterial Disease, Type I  Diabetes, End Stage Renal Clustered Wound: No Disease, Gout, Seizure Disorder Photos Wound Measurements Length: (cm) 0 Width: (cm) 0 Depth: (cm) 0 Area: (cm) 0 Volume: (cm) 0 % Reduction in Area: 100% % Reduction in Volume: 100% Epithelialization: Large (67-100%) Tunneling: No Undermining: No Wound Description Classification: Partial Thickness Exudate Amount: None Present Foul Odor After Cleansing: No Slough/Fibrino No Wound Bed Granulation Amount: None Present (0%) Exposed Structure Necrotic Amount: None Present (0%) Fascia Exposed: No Fat Layer (Subcutaneous Tissue) Exposed: No Tendon Exposed: No Muscle Exposed: No Joint Exposed: No Bone Exposed: No Electronic Signature(s) Signed: 10/26/2020 4:53:29 PM By: Carlene Coria RN Entered By: Carlene Coria on 10/26/2020 16:50:38 Taylor Castaneda ([id number]) -------------------------------------------------------------------------------- [place] Details Patient Name: Taylor Castaneda Date of Service: 10/26/2020 3:45 PM Medical Record Number: [id number] Patient Account Number: [id number] Date of Birth/Sex: [birth year] (87 y.o. F) Treating RN: Primary Care Nishi Neiswonger: [name] Other Clinician: Referring Dimples Probus: [name] Treating Micael Barb/Extender: Tito Dine in Treatment: 12 Vital Signs Time Taken: 16:05 Temperature (F): 98.1 Pulse (bpm): 66 Respiratory Rate (breaths/min): 16 Blood Pressure (mmHg): 168/68 Reference Range: 80 - 120 mg / dl Electronic Signature(s) Signed: 10/27/2020 4:37:07 PM By: Jeanine Luz Entered By: Jeanine Luz on 10/26/2020 16:09:59

## 2020-11-08 ENCOUNTER — Telehealth: Payer: Self-pay

## 2020-11-08 NOTE — Chronic Care Management (AMB) (Addendum)
Chronic Care Management Pharmacy Assistant   Name: Taylor Castaneda  MRN: [id number] DOB: May 19, 1933   Reason for Encounter: Disease State HTN    Recent office visits:  10/21/20-Dr Danise Mina, PCP -  No medication changes, Follow up 4 months 09/30/20-Dr Gutierrez,PCP phone call - Start Amlodipine 5 mg take 1 tablet daily   Recent consult visits:  10/26/20-Internal Medicine -wound care no medication changes 10/12/20-Internal Medicine- wound care no medication changes 09/28/20-Internal Medicine-wound care no medication changes  09/14/20-Internal Medicine-wound care no medication changes  Hospital visits:  None in previous 6 months  Medications: Outpatient Encounter Medications as of 11/08/2020  Medication Sig   acetaminophen (TYLENOL) 325 MG tablet Take 325-650 mg by mouth every 6 (six) hours as needed (for pain).   amLODipine (NORVASC) 5 MG tablet Take 1 tablet (5 mg total) by mouth daily.   atorvastatin (LIPITOR) 40 MG tablet Take 1 tablet (40 mg total) by mouth daily at 6 PM.   Biotin 5000 MCG TABS Take by mouth.   Carboxymethylcellul-Glycerin 1-0.25 % SOLN Place 1-2 drops into both eyes 3 (three) times daily as needed (for dry/irritated eyes.).   carvedilol (COREG) 12.5 MG tablet Take 1 tablet (12.5 mg total) by mouth 2 (two) times daily.   Cholecalciferol (VITAMIN D3) 25 MCG (1000 UT) CAPS Take 1 capsule (1,000 Units total) by mouth daily.   Cinnamon 500 MG capsule Take 1,000 mg by mouth daily.   clopidogrel (PLAVIX) 75 MG tablet Take 1 tablet (75 mg total) by mouth daily.   colchicine 0.6 MG tablet Take 1 tablet (0.6 mg total) by mouth daily as needed (gout flare). First day of gout flare, may take 1 tablet twice daily.   CONTOUR NEXT TEST test strip 1 each 4 (four) times daily.   diclofenac Sodium (VOLTAREN) 1 % GEL Apply 2 g topically 4 (four) times daily. (Patient taking differently: Apply 2 g topically 4 (four) times daily. As needed)   Ferrous Sulfate (IRON) 325 (65 Fe) MG TABS  Take 1 tablet (325 mg total) by mouth See admin instructions. Take 325 mg by mouth in the evening on Mondays and Thursdays only   insulin glargine (LANTUS) 100 UNIT/ML Solostar Pen Inject 8 Units into the skin daily.   levETIRAcetam (KEPPRA) 750 MG tablet Take 1 tablet (750 mg total) by mouth 2 (two) times daily.   losartan (COZAAR) 50 MG tablet Take 1 tablet (50 mg total) by mouth daily.   Multiple Vitamins-Minerals (PRESERVISION AREDS 2 PO) Take 1 capsule by mouth 2 (two) times daily.   nystatin cream (MYCOSTATIN) Apply 1 application topically 2 (two) times daily.   pantoprazole (PROTONIX) 40 MG tablet Take 1 tablet (40 mg total) by mouth daily.   No facility-administered encounter medications on file as of 11/08/2020.   Recent Office Vitals: BP Readings from Last 3 Encounters:  10/21/20 129/61  08/19/20 (!) 164/64  08/15/20 (!) 177/58   Pulse Readings from Last 3 Encounters:  10/21/20 67  08/19/20 62  08/15/20 76    Wt Readings from Last 3 Encounters:  10/21/20 103 lb (46.7 kg)  08/19/20 106 lb 3 oz (48.2 kg)  08/15/20 106 lb 12.8 oz (48.4 kg)     Kidney Function Lab Results  Component Value Date/Time   CREATININE 1.01 (H) 08/26/2020 02:55 PM   CREATININE 1.17 07/29/2020 10:39 AM   CREATININE 1.28 (H) 07/12/2020 05:11 PM   GFR 42.38 (L) 07/29/2020 10:39 AM   GFRNONAA 31 (L) 06/24/2020 05:34 AM  GFRAA 46 (L) 03/06/2019 03:12 AM    BMP Latest Ref Rng & Units 08/26/2020 07/29/2020 07/12/2020  Glucose 65 - 99 mg/dL 157(H) 152(H) 226(H)  BUN 7 - 25 mg/dL 41(H) 24(H) 30(H)  Creatinine 0.60 - 0.88 mg/dL 1.01(H) 1.17 1.28(H)  BUN/Creat Ratio 6 - 22 (calc) 41(H) - -  Sodium 135 - 146 mmol/L 140 139 137  Potassium 3.5 - 5.3 mmol/L 4.1 5.0 4.5  Chloride 98 - 110 mmol/L 109 103 104  CO2 20 - 32 mmol/L 18(L) 27 23  Calcium 8.6 - 10.4 mg/dL 8.7 9.7 8.5   Current antihypertensive regimen:  Carvedilol 12.5 mg - 1 tablet BID Losartan  50 mg - 1 tablet daily Amlodipine 5mg   1 tablet  daily   Patient verbally confirms she is taking the above medications as directed. Yes the caregiver confirms medications and dosing  How often are you checking your Blood Pressure? twice daily  she checks her blood pressure in the morning and in the evening before taking her medication  DATE:             BP               PULSE 11/02/20 Morning 108/65  59P Evening 148/69  64P 11/03/20 morning 145/70  67P Evening 134/63  66P 11/04/20 morning  155/70  59P Evening 140/70  64P 11/05/20 morning 122/66  62P   evening  159/84 69P 11/06/20 morning  154/72 63P Evening 125/60 65P 11/07/20 morning  125/64 57P Evening  163/77 59P 11/08/20 morning  137/65 61P   Evening  159/70 67P 11/09/20 morning  146/70 60P Evening 155/70  59P 11/10/20 morning  150/70  60P Evening 139/68  72P 11/11/20 morning 153/72 57P Evening  137/69  61P 11/12/20 morning 119/58 62P Evening 142/74  70P 11/13/20 morning 126/67 60P Evening 139/70 63P 11/14/20 morning  134/64 61P  Wrist or arm cuff:arm cuff Caffeine intake: none Salt intake: she does not add to meals  OTC medications including pseudoephedrine or NSAIDs? none  Any readings above 180/120? No  What recent interventions/DTPs have been made by any provider to improve Blood Pressure control since last CPP Visit: 10/22/20-BP remaining elevated in office however improving readings based on home log they bring - checking BP several times a day.  Any recent hospitalizations or ED visits since last visit with CPP? No  What diet changes have been made to improve Blood Pressure Control?   Cuts out salt   What exercise is being done to improve your Blood Pressure Control?  None identified  Adherence Review: Is the patient currently on ACE/ARB medication? Yes Does the patient have >5 day gap between last estimated fill dates? No   Star Rating Drugs:  Medication:  Last Fill: Day Supply Atorvastatin 40mg  08/06/20  90 Lantus 100 unit/ml 07/25/20 28 Losartan 50mg   10/22/20 90   Follow-Up:   Pharmacist Review  [name], CPP notified  Avel Sensor, [place] Assistant [phone]  I have reviewed the care management and care coordination activities outlined in this encounter and I am certifying that I agree with the content of this note. No further action required.  [name], PharmD Clinical Pharmacist [facility] [phone]

## 2020-11-24 ENCOUNTER — Other Ambulatory Visit: Payer: Self-pay | Admitting: Family Medicine

## 2020-11-25 DIAGNOSIS — H353132 Nonexudative age-related macular degeneration, bilateral, intermediate dry stage: Secondary | ICD-10-CM | POA: Diagnosis not present

## 2020-11-25 DIAGNOSIS — H35033 Hypertensive retinopathy, bilateral: Secondary | ICD-10-CM | POA: Diagnosis not present

## 2020-11-25 DIAGNOSIS — E119 Type 2 diabetes mellitus without complications: Secondary | ICD-10-CM | POA: Diagnosis not present

## 2020-11-25 DIAGNOSIS — H402232 Chronic angle-closure glaucoma, bilateral, moderate stage: Secondary | ICD-10-CM | POA: Diagnosis not present

## 2020-12-15 ENCOUNTER — Telehealth: Payer: Self-pay

## 2020-12-15 NOTE — Chronic Care Management (AMB) (Addendum)
    Chronic Care Management Pharmacy Assistant   Name: Taylor Castaneda  MRN: [id number] DOB: 07-Dec-1932  Reason for Encounter:  Schedule CCM Follow Up   Recent office visits:  None since last CCM outreach  Recent consult visits:  None since last [facility] visits:  None in previous 6 months  Medications: Outpatient Encounter Medications as of 12/15/2020  Medication Sig   acetaminophen (TYLENOL) 325 MG tablet Take 325-650 mg by mouth every 6 (six) hours as needed (for pain).   amLODipine (NORVASC) 5 MG tablet Take 1 tablet (5 mg total) by mouth daily.   atorvastatin (LIPITOR) 40 MG tablet Take 1 tablet (40 mg total) by mouth daily at 6 PM.   Biotin 5000 MCG TABS Take by mouth.   Carboxymethylcellul-Glycerin 1-0.25 % SOLN Place 1-2 drops into both eyes 3 (three) times daily as needed (for dry/irritated eyes.).   carvedilol (COREG) 12.5 MG tablet Take 1 tablet (12.5 mg total) by mouth 2 (two) times daily.   Cholecalciferol (VITAMIN D3) 25 MCG (1000 UT) CAPS Take 1 capsule (1,000 Units total) by mouth daily.   Cinnamon 500 MG capsule Take 1,000 mg by mouth daily.   clopidogrel (PLAVIX) 75 MG tablet Take 1 tablet (75 mg total) by mouth daily.   colchicine 0.6 MG tablet Take 1 tablet (0.6 mg total) by mouth daily as needed (gout flare). First day of gout flare, may take 1 tablet twice daily.   CONTOUR NEXT TEST test strip 1 each 4 (four) times daily.   diclofenac Sodium (VOLTAREN) 1 % GEL Apply 2 g topically 4 (four) times daily. (Patient taking differently: Apply 2 g topically 4 (four) times daily. As needed)   Ferrous Sulfate (IRON) 325 (65 Fe) MG TABS Take 1 tablet (325 mg total) by mouth See admin instructions. Take 325 mg by mouth in the evening on Mondays and Thursdays only   insulin glargine (LANTUS) 100 UNIT/ML Solostar Pen Inject 8 Units into the skin daily.   levETIRAcetam (KEPPRA) 750 MG tablet Take 1 tablet (750 mg total) by mouth 2 (two) times daily.   losartan  (COZAAR) 50 MG tablet Take 1 tablet (50 mg total) by mouth daily.   Multiple Vitamins-Minerals (PRESERVISION AREDS 2 PO) Take 1 capsule by mouth 2 (two) times daily.   nystatin cream (MYCOSTATIN) Apply 1 application topically 2 (two) times daily.   pantoprazole (PROTONIX) 40 MG tablet Take 1 tablet by mouth twice daily   No facility-administered encounter medications on file as of 12/15/2020.    Taylor Castaneda was contacted to schedule follow up  telephone visit with [name] to discuss blood pressure. Appointment was made for 01/02/21 at 10:00 AM. Patient was reminded to have all medications, supplements and any blood glucose and blood pressure readings available for review at appointment.  Are you having any problems with your medications? No  Do you have any concerns you like to discuss with the pharmacist? Yes- elevated BP   Star Rating Drugs: Medication:  Last Fill: Day Supply Losartan 50 mg 10/22/20 90 Atorvastatin 40 mg 11/12/20 [place], CPP notified  Margaretmary Dys, [place] Assistant [phone]  I have reviewed the care management and care coordination activities outlined in this encounter and I am certifying that I agree with the content of this note. No further action required.  [name], PharmD Clinical Pharmacist [facility] at [facility] [phone]

## 2021-01-02 ENCOUNTER — Other Ambulatory Visit: Payer: Self-pay

## 2021-01-02 ENCOUNTER — Ambulatory Visit (INDEPENDENT_AMBULATORY_CARE_PROVIDER_SITE_OTHER): Payer: Medicare Other

## 2021-01-02 DIAGNOSIS — Z794 Long term (current) use of insulin: Secondary | ICD-10-CM | POA: Diagnosis not present

## 2021-01-02 DIAGNOSIS — E118 Type 2 diabetes mellitus with unspecified complications: Secondary | ICD-10-CM

## 2021-01-02 DIAGNOSIS — I10 Essential (primary) hypertension: Secondary | ICD-10-CM

## 2021-01-02 NOTE — Progress Notes (Signed)
Chronic Care Management Pharmacy Note  01/02/2021 Name:  Taylor Castaneda MRN:  [id number] DOB:  11/21/1932  Subjective: Taylor Castaneda is an 87 y.o. year old female who is a primary patient of [name], MD.  The CCM team was consulted for assistance with disease management and care coordination needs.  Spoke with [name], who lives with [name].  Engaged with patient by telephone for follow up visit in response to provider referral for pharmacy case management and/or care coordination services.   Consent to Services:  The patient was given information about Chronic Care Management services, agreed to services, and gave verbal consent prior to initiation of services.  Please see initial visit note for detailed documentation.   Patient Care Team: [name], MD as PCP - General (Family Medicine) Jacelyn Pi, MD as Consulting Physician (Endocrinology) [name], [facility] as Pharmacist (Pharmacist)  Recent office visits: 10/21/20 - Dr Danise Mina, PCP -  No medication changes, Follow up 4 months 09/30/20 - Dr Danise Mina, PCP (phone call) - Start Amlodipine 5 mg take 1 tablet daily  Recent consult visits: None since last CCM contact  Hospital visits:  05/30/20- 06/10/20 - Stroke  Objective:  Lab Results  Component Value Date   CREATININE 1.01 (H) 08/26/2020   BUN 41 (H) 08/26/2020   GFR 42.38 (L) 07/29/2020   GFRNONAA 31 (L) 06/24/2020   GFRAA 46 (L) 03/06/2019   NA 140 08/26/2020   K 4.1 08/26/2020   CALCIUM 8.7 08/26/2020   CO2 18 (L) 08/26/2020    Lab Results  Component Value Date/Time   HGBA1C 7.9 (H) 06/02/2020 09:16 PM   HGBA1C 8.7 (H) 02/12/2020 07:59 AM   HGBA1C 7 04/06/2019 12:00 AM   GFR 42.38 (L) 07/29/2020 10:39 AM   GFR 38.06 (L) 07/12/2020 05:11 PM   MICROALBUR 2.2 (H) 08/20/2017 10:22 AM   MICROALBUR 7.3 (H) 03/10/2014 09:12 AM    Last diabetic Eye exam:  Lab Results  Component Value Date/Time   HMDIABEYEEXA No Retinopathy 05/05/2020 12:00 AM     Last diabetic Foot exam: Followed by endocrinology  Lab Results  Component Value Date   CHOL 118 02/12/2020   HDL 32.30 (L) 02/12/2020   LDLCALC 60 02/12/2020   LDLDIRECT 103.0 02/18/2018   TRIG 127.0 02/12/2020   CHOLHDL 4 02/12/2020    Hepatic Function Latest Ref Rng & Units 07/29/2020 06/13/2020 06/07/2020  Total Protein 6.5 - 8.1 g/dL - 5.3(L) 5.0(L)  Albumin 3.5 - 5.2 g/dL 3.5 2.3(L) 2.3(L)  AST 15 - 41 U/L - 21 29  ALT 0 - 44 U/L - 22 36  Alk Phosphatase 38 - 126 U/L - 156(H) 171(H)  Total Bilirubin 0.3 - 1.2 mg/dL - 0.7 0.5  Bilirubin, Direct 0.0 - 0.2 mg/dL - - -    Lab Results  Component Value Date/Time   TSH 1.712 06/02/2020 02:30 PM   TSH 2.12 01/28/2019 11:35 AM   TSH 1.20 10/08/2012 08:16 AM    CBC Latest Ref Rng & Units 07/29/2020 07/12/2020 06/19/2020  WBC 4.0 - 10.5 K/uL 7.8 11.3(H) 8.8  Hemoglobin 12.0 - 15.0 g/dL 11.3(L) 10.2(L) 8.4(L)  Hematocrit 36.0 - 46.0 % 34.6(L) 31.9(L) 25.3(L)  Platelets 150.0 - 400.0 K/uL 190.0 203.0 213    Lab Results  Component Value Date/Time   VD25OH 41.03 02/12/2020 07:59 AM   VD25OH 45.81 02/26/2019 08:55 AM    Clinical ASCVD: Yes  The ASCVD Risk score Mikey Bussing DC Jr., et al., 2013) failed to calculate for the following  reasons:   The 2013 ASCVD risk score is only valid for ages 3 to 72   The patient has a prior MI or stroke diagnosis    Depression screen [facility] 2/9 07/05/2020 02/19/2020 02/19/2020  Decreased Interest _0 Down, Depressed, Hopeless 0 0 0  PHQ - 2 Score _1 Altered sleeping 2 2 -  Tired, decreased energy 2 2 -  Change in appetite 2 2 -  Feeling bad or failure about yourself  0 0 -  Trouble concentrating 1 0 -  Moving slowly or fidgety/restless 0 0 -  Suicidal thoughts 0 0 -  PHQ-9 Score 8 8 -  Difficult doing work/chores - - -  Some recent data might be hidden     Social History   Tobacco Use  Smoking Status Former   Years: 2.00   Types: Cigarettes   Quit date: 06/04/1973   Years since  quitting: 47.6  Smokeless Tobacco Never  Tobacco Comments   Smoked for 5 years 1965-1970 up to 1/2 pp week   BP Readings from Last 3 Encounters:  10/21/20 129/61  08/19/20 (!) 164/64  08/15/20 (!) 177/58   Pulse Readings from Last 3 Encounters:  10/21/20 67  08/19/20 62  08/15/20 76   Wt Readings from Last 3 Encounters:  10/21/20 103 lb (46.7 kg)  08/19/20 106 lb 3 oz (48.2 kg)  08/15/20 106 lb 12.8 oz (48.4 kg)    Assessment/Interventions: Review of patient past medical history, allergies, medications, health status, including review of consultants reports, laboratory and other test data, was performed as part of comprehensive evaluation and provision of chronic care management services.   SDOH:  (Social Determinants of Health) assessments and interventions performed: Yes    CCM Care Plan  Allergies  Allergen Reactions   Gabapentin Other (See Comments)    Dizziness   Pravastatin Sodium Other (See Comments)    "Anemia," per Dr Gilford Rile, [facility]    Medications Reviewed Today     Reviewed by [name], MD (Physician) on 10/21/20 at 1541  Med List Status: <None>   Medication Order Taking? Sig Documenting Provider Last Dose Status Informant  acetaminophen (TYLENOL) 325 MG tablet [id number] Yes Take 325-650 mg by mouth every 6 (six) hours as needed (for pain). [provider] Taking Active   amLODipine (NORVASC) 5 MG tablet [id number] Yes Take 1 tablet (5 mg total) by mouth daily. [name], MD Taking Active   atorvastatin (LIPITOR) 40 MG tablet [id number] Yes Take 1 tablet (40 mg total) by mouth daily at 6 PM. Cathlyn Parsons, PA-C Taking Active   Biotin 5000 MCG TABS [id number] Yes Take by mouth. [provider] Taking Active Child  Carboxymethylcellul-Glycerin 1-0.25 % SOLN [id number] Yes Place 1-2 drops into both eyes 3 (three) times daily as needed (for dry/irritated eyes.). [provider] Taking Active Family Member   carvedilol (COREG) 12.5 MG tablet [id number] Yes Take 1 tablet (12.5 mg total) by mouth 2 (two) times daily. Cathlyn Parsons, PA-C Taking Active   Cholecalciferol (VITAMIN D3) 25 MCG (1000 UT) CAPS [id number] Yes Take 1 capsule (1,000 Units total) by mouth daily. Cathlyn Parsons, PA-C Taking Active   Cinnamon 500 MG capsule [id number] Yes Take 1,000 mg by mouth daily. [provider] Taking Active Family Member  clopidogrel (PLAVIX) 75 MG tablet [id number] Yes Take 1 tablet (75 mg total) by mouth daily. Penni Bombard, MD Taking Active   colchicine 0.6 MG tablet [id number] Yes  Take 1 tablet (0.6 mg total) by mouth daily as needed (gout flare). First day of gout flare, may take 1 tablet twice daily. [name], MD Taking Active   CONTOUR NEXT TEST test strip [id number] Yes 1 each 4 (four) times daily. [provider] Taking Active   diclofenac Sodium (VOLTAREN) 1 % GEL [id number] Yes Apply 2 g topically 4 (four) times daily.  Patient taking differently: Apply 2 g topically 4 (four) times daily. As needed   Angiulli, Lavon Paganini, PA-C Taking Active   Ferrous Sulfate (IRON) 325 (65 Fe) MG TABS [id number] Yes Take 1 tablet (325 mg total) by mouth See admin instructions. Take 325 mg by mouth in the evening on Mondays and Thursdays only Angiulli, Lavon Paganini, PA-C Taking Active   insulin glargine (LANTUS) 100 UNIT/ML Solostar Pen [id number] Yes Inject 8 Units into the skin daily. Cathlyn Parsons, PA-C Taking Active   levETIRAcetam (KEPPRA) 750 MG tablet [id number] Yes Take 1 tablet (750 mg total) by mouth 2 (two) times daily. Penumalli, Earlean Polka, MD Taking Active   losartan (COZAAR) 50 MG tablet [id number] Yes Take 1 tablet (50 mg total) by mouth daily. [name], MD Taking Active   Multiple Vitamins-Minerals (PRESERVISION AREDS 2 PO) [id number] Yes Take 1 capsule by mouth 2 (two) times daily. [provider] Taking Active Family Member  nystatin cream (MYCOSTATIN)  [id number] Yes Apply 1 application topically 2 (two) times daily. [name], MD Taking Active   pantoprazole (PROTONIX) 40 MG tablet [id number] Yes Take 1 tablet (40 mg total) by mouth daily. [name], MD Taking Active             Patient Active Problem List   Diagnosis Date Noted   Hereditary and idiopathic neuropathy, unspecified 08/15/2020   Long term (current) use of insulin ([place]) 08/15/2020   Proteinuria 08/15/2020   Intertrigo 07/29/2020   GI bleed    Pressure injury of left heel, unstageable ([place]) 06/12/2020   Cerebral thrombosis with cerebral infarction 06/03/2020   Common bile duct (CBD) obstruction    Choledocholithiasis 05/30/2020   Cholelithiasis 05/30/2020   Hyperbilirubinemia 05/30/2020   Normocytic anemia 05/30/2020   Transaminitis 05/30/2020   Pancreatic duct dilated 05/30/2020   Dry age-related macular degeneration 05/06/2020   Secondary hyperparathyroidism of renal origin ([place]) 02/20/2020   Anhedonia 02/20/2020   Seizure ([place]) 03/18/2019   Transient atrial fibrillation ([place]) 03/18/2019   MCI (mild cognitive impairment) 03/18/2019   History of gout 04/29/2017   OA (osteoarthritis) of knee 12/17/2016   History of subcortical infarction 07/2020 06/13/2016   Health maintenance examination 01/30/2016   Medicare annual wellness visit, subsequent 03/10/2014   Advanced care planning/counseling discussion 03/10/2014   PAD (peripheral artery disease) ([place]) 11/11/2013   Dyspnea 09/18/2011   PMR (polymyalgia rheumatica) ([place]) 08/27/2011   Controlled diabetes mellitus type 2 with complications ([place]) 57/06/7791   Essential hypertension 06/13/2009   CKD (chronic kidney disease) stage 3, GFR 30-59 ml/min (HCC) 01/24/2009   Primary angle-closure glaucoma 11/13/2007   Osteopenia 11/13/2007   History of colon cancer 11/13/2007   Hyperglycemia due to type 2 diabetes mellitus ([place]) 10/23/2006    Immunization History  Administered Date(s) Administered    Fluad Quad(high Dose 65+) 03/03/2019, 02/19/2020   Influenza Split 03/16/2011, 03/12/2012   Influenza Whole 06/04/2002, 04/24/2007, 03/10/2008, 03/31/2009, 04/18/2010   Influenza, High Dose Seasonal PF 03/18/2013, 03/02/2015   Influenza,inj,Quad PF,6+ Mos 03/10/2014, 01/30/2016, 02/07/2017, 02/18/2018   PFIZER(Purple Top)SARS-COV-2 Vaccination 08/31/2019, 09/23/2019   Pneumococcal Conjugate-13 03/10/2014  Pneumococcal Polysaccharide-23 03/23/2011   Td 06/08/1996   Zoster, Live 06/05/2007    Conditions to be addressed/monitored:  Hypertension, Hyperlipidemia and Diabetes  Patient Care Plan: CCM Pharmacy Care Plan     Problem Identified: CHL AMB "PATIENT-SPECIFIC PROBLEM"      Long-Range Goal: Disease Management   Start Date: 08/08/2020  Priority: High  Note:   Current Barriers:  None identified  Pharmacist Clinical Goal(s):  Over the next 6 months, patient will contact provider office for questions/concerns as evidenced notation of same in electronic health record through collaboration with PharmD and provider.   Interventions: 1:1 collaboration with [name], MD regarding development and update of comprehensive plan of care as evidenced by provider attestation and co-signature Inter-disciplinary care team collaboration (see longitudinal plan of care) Comprehensive medication review performed; medication list updated in electronic medical record  Hypertension (BP goal <140/90) -Controlled - per home BP readings today -Adherence - Sister fills the pillbox for the week and [name] makes sure she takes it morning and supper. -Current treatment: Carvedilol 12.5 - 1 BID Losartan 50 mg - 1 daily Amlodipine 5 mg - 1 daily -Medications previously tried: Ramipril  -Current home readings:  Spoke with [name] 7/23 -  134/65, 64 (Breakfast) 131/66, 64 (bedtime) 7/24 -  125/66, 64 135/66, 56 7/25 -  124/64, 58 112/68, 71 7/25 -  11961, 58  139/66, 70 7/27 -  142/83,  72  117/63, 62 7/28 -  118/63, 63  144/79, 60 7/29 -   144/72, 60   138/67, 61 7/30 -  146/74, 63  130/70, 58 -Denies hypotensive/hypertensive symptoms -Recommended to continue current medication; Monitor BP at home with symptoms, document, and provide log at future appointments  Hyperlipidemia: (LDL goal < 70) -Controlled - stable, condition not addressed 01/02/21 -Current treatment: Atorvastatin 40 mg - 1 tablet daily Plavix 75 mg - 1 tablet daily -Educated on: per neuro, patient to switch to Plavix alone on September 02, 2020 -Recommended to continue current medication  Diabetes (A1c goal <8%) -Controlled - A1c 7.9%, followed by endocrinology, Condition not addressed 01/02/21 -Current medications: Lantus - Inject 8 units daily  -Medications previously tried: Humalog d/c in hospital 05/30/20 -Current home glucose readings - did not have log today -Log last reviewed 06/24/20 and fasting BG within goal, no hypoglycemia  -Denies hypoglycemic/hyperglycemic symptoms -Recommended to continue current medication; Check feet daily and complete yearly eye exams  Patient Goals/Self-Care Activities Over the next 6 months, patient will:  - take medications as prescribed - check glucose daily, document, and provide at future appointments - check blood pressure weekly, document, and provide at future appointments  Follow Up Plan: Telephone follow up - 6 months      Medication Assistance: None required.  Patient affirms current coverage meets needs.  Patient's preferred pharmacy is:  [facility] [address] [place] - [address] [place] [address] Phone: [phone] Fax: [phone]  Uses pill box? Yes Sister fills pillbox, [name] reminds patient to take them BID  Care Plan and Follow Up Patient Decision:  Patient agrees to Care Plan and Follow-up.  [name], PharmD Clinical Pharmacist [facility] at [facility] [phone]

## 2021-01-02 NOTE — Patient Instructions (Signed)
Dear Taylor Castaneda,  Below is a summary of the goals we discussed during our follow up appointment on January 02, 2021. Please contact me anytime with questions or concerns.   Visit Information  Patient Care Plan: CCM Pharmacy Care Plan     Problem Identified: CHL AMB "PATIENT-SPECIFIC PROBLEM"      Long-Range Goal: Disease Management   Start Date: 08/08/2020  Priority: High  Note:   Current Barriers:  None identified  Pharmacist Clinical Goal(s):  Over the next 6 months, patient will contact provider office for questions/concerns as evidenced notation of same in electronic health record through collaboration with PharmD and provider.   Interventions: 1:1 collaboration with [name], MD regarding development and update of comprehensive plan of care as evidenced by provider attestation and co-signature Inter-disciplinary care team collaboration (see longitudinal plan of care) Comprehensive medication review performed; medication list updated in electronic medical record  Hypertension (BP goal <140/90) -Controlled - per home BP readings today -Adherence - Sister fills the pillbox for the week and Taylor Castaneda makes sure she takes it morning and supper. -Current treatment: Carvedilol 12.5 - 1 BID Losartan 50 mg - 1 daily Amlodipine 5 mg - 1 daily -Medications previously tried: Ramipril  -Current home readings:  Spoke with Taylor Castaneda 7/23 -  134/65, 64 (Breakfast) 131/66, 64 (bedtime) 7/24 -  125/66, 64 135/66, 56 7/25 -  124/64, 58 112/68, 71 7/25 -  11961, 58  139/66, 70 7/27 -  142/83, 72  117/63, 62 7/28 -  118/63, 63  144/79, 60 7/29 -   144/72, 60   138/67, 61 7/30 -  146/74, 63  130/70, 58 -Denies hypotensive/hypertensive symptoms -Recommended to continue current medication; Monitor BP at home with symptoms, document, and provide log at future appointments  Hyperlipidemia: (LDL goal < 70) -Controlled - stable, condition not addressed 01/02/21 -Current  treatment: Atorvastatin 40 mg - 1 tablet daily Plavix 75 mg - 1 tablet daily -Educated on: per neuro, patient to switch to Plavix alone on September 02, 2020 -Recommended to continue current medication  Diabetes (A1c goal <8%) -Controlled - A1c 7.9%, followed by endocrinology, Condition not addressed 01/02/21 -Current medications: Lantus - Inject 8 units daily  -Medications previously tried: Humalog d/c in hospital 05/30/20 -Current home glucose readings - did not have log today -Log last reviewed 06/24/20 and fasting BG within goal, no hypoglycemia  -Denies hypoglycemic/hyperglycemic symptoms -Recommended to continue current medication; Check feet daily and complete yearly eye exams  Patient Goals/Self-Care Activities Over the next 6 months, patient will:  - take medications as prescribed - check glucose daily, document, and provide at future appointments - check blood pressure weekly, document, and provide at future appointments  Follow Up Plan: Telephone follow up - 6 months      Patient verbalizes understanding of instructions provided today and agrees to view in [place].   [name], PharmD Clinical Pharmacist [facility] at [facility] [phone]

## 2021-02-07 ENCOUNTER — Telehealth: Payer: Self-pay | Admitting: *Deleted

## 2021-02-07 MED ORDER — LOSARTAN POTASSIUM 100 MG PO TABS: 100.0000 mg | ORAL_TABLET | Freq: Every day | ORAL | 3 refills | Status: DC

## 2021-02-07 NOTE — Telephone Encounter (Signed)
PLEASE NOTE: All timestamps contained within this report are represented as [place] Standard Time. CONFIDENTIALTY NOTICE: This fax transmission is intended only for the addressee. It contains information that is legally privileged, confidential or otherwise protected from use or disclosure. If you are not the intended recipient, you are strictly prohibited from reviewing, disclosing, copying using or disseminating any of this information or taking any action in reliance on or regarding this information. If you have received this fax in error, please notify us immediately by telephone so that we can arrange for its return to [place]. Phone: [phone], Toll-Free: [phone], Fax: [phone] Page: 1 of 2 Call Id: [id number] [place] [facility] - Client TELEPHONE ADVICE RECORD AccessNurse Patient Name: Taylor Castaneda [facility] Gender: Female DOB: [birth year] Age: 87 Y 36 M 9 D Return Phone Number: [phone] (Primary) Address: City/ State/ Zip: [place]  [address] Client [place] [facility] - Client Client Site [place] Physician [name] - MD Contact Type Call Who Is Calling Patient / Member / Family / Caregiver Call Type Triage / Clinical Caller Name Johnnye Lana Relationship To Patient Son Return Phone Number [phone] (Primary) Chief Complaint Blood Pressure High Reason for Call Symptomatic / Request for [place] states his mom has been high for a few days. Her most recent blood pressure is 178/90 and pulse of 52. Translation No Nurse Assessment Nurse: Martyn Ehrich, RN, [place] Date/Time (Eastern Time): 02/03/2021 5:03:58 PM Confirm and document reason for call. If symptomatic, describe symptoms. ---Pt has 178/90 BP at 4:25 pm. It has been running from 137 up to 170 and 180 at times. P-62 at 4:25 pm. Usually it is 69, 72, 61. No fever Does the patient have any new  or worsening symptoms? ---Yes Will a triage be completed? ---Yes Related visit to physician within the last 2 weeks? ---No Does the PT have any chronic conditions? (i.e. diabetes, asthma, this includes High risk factors for pregnancy, etc.) ---Yes List chronic conditions. ---DM htn Is this a behavioral health or substance abuse call? ---No Guidelines Guideline Title Affirmed Question Affirmed Notes Nurse Date/Time (Eastern Time) Blood Pressure - High Systolic BP >= 742 OR Diastolic >= 595 Gaddy, RN, Felicia 11/05/8754 4:33:29 PM Disp. Time Eilene Ghazi Time) Disposition Final User 02/03/2021 5:17:22 PM See PCP within 24 Hours Yes Gaddy, RN, [place] Ice PLEASE NOTE: All timestamps contained within this report are represented as [place] Standard Time. CONFIDENTIALTY NOTICE: This fax transmission is intended only for the addressee. It contains information that is legally privileged, confidential or otherwise protected from use or disclosure. If you are not the intended recipient, you are strictly prohibited from reviewing, disclosing, copying using or disseminating any of this information or taking any action in reliance on or regarding this information. If you have received this fax in error, please notify us immediately by telephone so that we can arrange for its return to [place]. Phone: [phone], Toll-Free: [phone], Fax: [phone] Page: 2 of 2 Call Id: [id number] [place] Disagree/Comply Comply Caller Understands Yes PreDisposition Did not know what to do Care Advice Given Per Guideline * Weakness or numbness of the face, arm or leg on one side of the body occurs * The goal of blood pressure treatment for most people with hypertension is to keep the blood pressure under 140/90. For people that are 60 years or older, your doctor may instead want to keep the blood pressure under 150/90. * Untreated high blood  pressure may cause damage to your heart, brain, kidneys, and eyes. * Difficulty  walking, difficulty talking, or severe headache occurs * You become worse * Chest pain or difficulty breathing occurs Comments User: Daphene Calamity, RN Date/Time Eilene Ghazi Time): 02/03/2021 5:15:40 PM 182/91 P-66 now Referrals GO TO FACILITY UNDECIDED

## 2021-02-07 NOTE — Telephone Encounter (Signed)
Spoke with pt's son, [name] (on dpr), relaying Dr. Synthia Innocent message.  Verbalizes understanding and expresses his thanks.

## 2021-02-07 NOTE — Telephone Encounter (Signed)
Would suggest we increase losartan to 100mg  daily - take 2 tablets until she runs out. New 100mg  tablets will be at pharmacy.  Let us know how BPs are running in another 1-2 wks.  Let us know sooner if any trouble with increased dose.

## 2021-02-07 NOTE — Telephone Encounter (Signed)
Spoke to patient's son and was advised that they decided not to take his mom anywhere to be seen.Taylor Castaneda stated that his mom is doing fine today and her blood pressure is 149/84 heart rate 65. Taylor Castaneda stated that his mom has not complained of chest pain, SOB or difficulty breathing. Taylor Castaneda stated other than his mom's blood pressure being up she seems to be fine. Taylor Castaneda stated that her blood pressure has been fluctuating a lot. Taylor Castaneda stated his mom's blood pressure 02/06/21 was 166/83 am heart rate 63 and 159/82 heart rate 63 in the pm. Taylor Castaneda was given ER precautions and he verbalized understanding.

## 2021-02-16 ENCOUNTER — Telehealth: Payer: Self-pay | Admitting: *Deleted

## 2021-02-16 NOTE — Telephone Encounter (Signed)
PLEASE NOTE: All timestamps contained within this report are represented as [place] Standard Time. CONFIDENTIALTY NOTICE: This fax transmission is intended only for the addressee. It contains information that is legally privileged, confidential or otherwise protected from use or disclosure. If you are not the intended recipient, you are strictly prohibited from reviewing, disclosing, copying using or disseminating any of this information or taking any action in reliance on or regarding this information. If you have received this fax in error, please notify us immediately by telephone so that we can arrange for its return to [place]. Phone: [phone], Toll-Free: [phone], Fax: [phone] Page: 1 of 2 Call Id: [id number] [facility] - Client TELEPHONE ADVICE RECORD AccessNurse Patient Name: Taylor Castaneda [facility] Gender: Female DOB: 11-Feb-1933 Age: 87 Y 89 M 22 D Return Phone Number: [phone] (Primary), [phone] (Secondary) Address: City/ State/ Zip: [place]  [address] Client [facility] - Client Client Site [facility] Physician [name] - MD Contact Type Call Who Is Calling Patient / Member / Family / Caregiver Call Type Triage / Clinical Caller Name Theadora Rama from [place] Relationship To Patient Other Return Phone Number [phone] (Primary) Chief Complaint Blood Pressure High Reason for Call Symptomatic / Request for Health Information Initial Comment Transferred from office. PT has hypertension. 186/80 and 192/84, no other symptoms. Translation No Nurse Assessment Nurse: Lucky Cowboy, RN, Levada Dy Date/Time (Eastern Time): 02/16/2021 12:35:08 PM Confirm and document reason for call. If symptomatic, describe symptoms. ---Caller stated that pt is having elevated BP. She has no s/s currently. Most recent BP is 186/80 and 192/84. Does the patient have any new or worsening symptoms?  ---Yes Will a triage be completed? ---Yes Related visit to physician within the last 2 weeks? ---Yes Does the PT have any chronic conditions? (i.e. diabetes, asthma, this includes High risk factors for pregnancy, etc.) ---Yes List chronic conditions. ---HTN, CKD, hx CVA, diabetes, A-fib Is this a behavioral health or substance abuse call? ---No Guidelines Guideline Title Affirmed Question Affirmed Notes Nurse Date/Time (Eastern Time) Blood Pressure - High Systolic BP >= 378 OR Diastolic >= 588 Dew, RN, Angela 02/16/2021 12:36:53 PM Disp. Time Eilene Ghazi Time) Disposition Final User 02/16/2021 1:10:12 PM Paged On Call back to [facility], [place], Levada Dy 02/16/2021 1:22:19 PM Called On-Call Provider [place], [place], Sherre Poot 02/16/2021 12:46:04 PM See PCP within 24 Hours Yes Dew, RN, Levada Dy PLEASE NOTE: All timestamps contained within this report are represented as [place] Standard Time. CONFIDENTIALTY NOTICE: This fax transmission is intended only for the addressee. It contains information that is legally privileged, confidential or otherwise protected from use or disclosure. If you are not the intended recipient, you are strictly prohibited from reviewing, disclosing, copying using or disseminating any of this information or taking any action in reliance on or regarding this information. If you have received this fax in error, please notify us immediately by telephone so that we can arrange for its return to [place]. Phone: [phone], Toll-Free: [phone], Fax: [phone] Page: 2 of 2 Call Id: [id number] Caller Disagree/Comply Comply Caller Understands Yes PreDisposition Call Doctor Care Advice Given Per Guideline SEE PCP WITHIN 24 HOURS: * IF OFFICE WILL BE OPEN: You need to be examined within the next 24 hours. Call your doctor (or NP/PA) when the office opens and make an appointment. CARE ADVICE given per High Blood Pressure (Adult) guideline. Referrals REFERRED TO PCP  OFFICE Paging DoctorName Phone DateTime Result/ Outcome Message Type Notes [email or web address],thomas  [phone] 02/16/2021 1:10:12 PM Paged On Call Back to Call Center Doctor Paged Pls call me back at [phone] regarding a patient. Another provider is requesting information on them. Levada Dy RN Access Nurse [name] - MD [phone] 02/16/2021 1:22:19 PM Called On Call Provider - Reached Doctor Paged [name] - MD 02/16/2021 1:23:44 PM Spoke with On Call - General Message Result Dr. Danise Mina notified, and warm conf. to [place].

## 2021-02-16 NOTE — Telephone Encounter (Signed)
Spoke to [place] at [facility] and was advised that she has already talked to Dr. Danise Mina regarding the patient.

## 2021-02-17 ENCOUNTER — Other Ambulatory Visit (HOSPITAL_COMMUNITY): Payer: Self-pay

## 2021-02-17 NOTE — Telephone Encounter (Signed)
Spoke with [place] regarding ongoing elevated blood pressures - I did ask pt to come in for sooner appt - plz call to schedule for appt next week if able to come to [place], may open 12:30pm slot. Can offer rescheduling CPE for next week.

## 2021-02-18 ENCOUNTER — Other Ambulatory Visit: Payer: Self-pay | Admitting: Family Medicine

## 2021-02-18 DIAGNOSIS — N2581 Secondary hyperparathyroidism of renal origin: Secondary | ICD-10-CM

## 2021-02-18 DIAGNOSIS — M85851 Other specified disorders of bone density and structure, right thigh: Secondary | ICD-10-CM

## 2021-02-18 DIAGNOSIS — Z794 Long term (current) use of insulin: Secondary | ICD-10-CM

## 2021-02-18 DIAGNOSIS — E118 Type 2 diabetes mellitus with unspecified complications: Secondary | ICD-10-CM

## 2021-02-18 DIAGNOSIS — Z8739 Personal history of other diseases of the musculoskeletal system and connective tissue: Secondary | ICD-10-CM

## 2021-02-18 DIAGNOSIS — N1831 Chronic kidney disease, stage 3a: Secondary | ICD-10-CM

## 2021-02-18 NOTE — Telephone Encounter (Signed)
Left message on VM asking that they call and set up an appt with Dr Darnell Level next week.

## 2021-02-20 ENCOUNTER — Encounter: Payer: Self-pay | Admitting: Family Medicine

## 2021-02-20 NOTE — Telephone Encounter (Signed)
Spoke to daughter.Cleared up her appts. She can come here to have her labs done on 02-24-21. I will do it if I have to.

## 2021-02-21 ENCOUNTER — Ambulatory Visit: Payer: Medicare Other

## 2021-02-21 NOTE — Telephone Encounter (Signed)
Patient's son [name] notified as instructed by telephone and verbalized understanding.  Patient's son stated that they are not able to bring his mom for an earlier appointment. [name] stated that they are taking his mom for blood work Friday so that Dr. Danise Mina will have it when she comes for her appointment. [name] stated that Theadora Rama only comes out every 6 months to check on his mom. [name] stated that they will continue to monitor his mom's blood pressure. [name] stated that they will call if her blood pressure is elevated before her appointment.

## 2021-02-24 ENCOUNTER — Other Ambulatory Visit: Payer: Self-pay

## 2021-02-24 ENCOUNTER — Other Ambulatory Visit: Payer: Medicare Other

## 2021-02-24 ENCOUNTER — Other Ambulatory Visit (INDEPENDENT_AMBULATORY_CARE_PROVIDER_SITE_OTHER): Payer: Medicare Other

## 2021-02-24 DIAGNOSIS — M85851 Other specified disorders of bone density and structure, right thigh: Secondary | ICD-10-CM

## 2021-02-24 DIAGNOSIS — N1831 Chronic kidney disease, stage 3a: Secondary | ICD-10-CM

## 2021-02-24 DIAGNOSIS — E118 Type 2 diabetes mellitus with unspecified complications: Secondary | ICD-10-CM

## 2021-02-24 DIAGNOSIS — Z794 Long term (current) use of insulin: Secondary | ICD-10-CM

## 2021-02-24 DIAGNOSIS — Z8739 Personal history of other diseases of the musculoskeletal system and connective tissue: Secondary | ICD-10-CM | POA: Diagnosis not present

## 2021-02-24 DIAGNOSIS — N2581 Secondary hyperparathyroidism of renal origin: Secondary | ICD-10-CM

## 2021-02-24 LAB — CBC WITH DIFFERENTIAL/PLATELET
Basophils Absolute: 0.1 10*3/uL (ref 0.0–0.1)
Basophils Relative: 0.7 % (ref 0.0–3.0)
Eosinophils Absolute: 0.2 10*3/uL (ref 0.0–0.7)
Eosinophils Relative: 2.8 % (ref 0.0–5.0)
HCT: 36.1 % (ref 36.0–46.0)
Hemoglobin: 12.2 g/dL (ref 12.0–15.0)
Lymphocytes Relative: 19.8 % (ref 12.0–46.0)
Lymphs Abs: 1.5 10*3/uL (ref 0.7–4.0)
MCHC: 33.7 g/dL (ref 30.0–36.0)
MCV: 94.8 fl (ref 78.0–100.0)
Monocytes Absolute: 0.6 10*3/uL (ref 0.1–1.0)
Monocytes Relative: 7.4 % (ref 3.0–12.0)
Neutro Abs: 5.4 10*3/uL (ref 1.4–7.7)
Neutrophils Relative %: 69.3 % (ref 43.0–77.0)
Platelets: 171 10*3/uL (ref 150.0–400.0)
RBC: 3.81 Mil/uL — ABNORMAL LOW (ref 3.87–5.11)
RDW: 13 % (ref 11.5–15.5)
WBC: 7.8 10*3/uL (ref 4.0–10.5)

## 2021-02-24 LAB — COMPREHENSIVE METABOLIC PANEL
ALT: 15 U/L (ref 0–35)
AST: 18 U/L (ref 0–37)
Albumin: 3.9 g/dL (ref 3.5–5.2)
Alkaline Phosphatase: 112 U/L (ref 39–117)
BUN: 37 mg/dL — ABNORMAL HIGH (ref 6–23)
CO2: 25 mEq/L (ref 19–32)
Calcium: 9.3 mg/dL (ref 8.4–10.5)
Chloride: 107 mEq/L (ref 96–112)
Creatinine, Ser: 1.03 mg/dL (ref 0.40–1.20)
GFR: 49.18 mL/min — ABNORMAL LOW (ref >60.00)
Glucose, Bld: 148 mg/dL — ABNORMAL HIGH (ref 70–99)
Potassium: 4.2 mEq/L (ref 3.5–5.1)
Sodium: 142 mEq/L (ref 135–145)
Total Bilirubin: 0.6 mg/dL (ref 0.2–1.2)
Total Protein: 6.3 g/dL (ref 6.0–8.3)

## 2021-02-24 LAB — LIPID PANEL
Cholesterol: 126 mg/dL (ref 0–200)
HDL: 38.7 mg/dL — ABNORMAL LOW (ref >39.00)
LDL Cholesterol: 60 mg/dL (ref 0–99)
NonHDL: 86.91
Total CHOL/HDL Ratio: 3
Triglycerides: 134 mg/dL (ref 0.0–149.0)
VLDL: 26.8 mg/dL (ref 0.0–40.0)

## 2021-02-24 LAB — PHOSPHORUS: Phosphorus: 3.8 mg/dL (ref 2.3–4.6)

## 2021-02-24 LAB — URIC ACID: Uric Acid, Serum: 4.2 mg/dL (ref 2.4–7.0)

## 2021-02-24 LAB — HEMOGLOBIN A1C: Hgb A1c MFr Bld: 7.7 % — ABNORMAL HIGH (ref 4.6–6.5)

## 2021-02-24 LAB — VITAMIN D 25 HYDROXY (VIT D DEFICIENCY, FRACTURES): VITD: 43.25 ng/mL (ref 30.00–100.00)

## 2021-02-27 LAB — PARATHYROID HORMONE, INTACT (NO CA): PTH: 45 pg/mL (ref 16–77)

## 2021-02-27 LAB — EXTRA SPECIMEN

## 2021-03-03 ENCOUNTER — Encounter: Payer: Medicare Other | Admitting: Family Medicine

## 2021-03-10 ENCOUNTER — Ambulatory Visit (INDEPENDENT_AMBULATORY_CARE_PROVIDER_SITE_OTHER): Payer: Medicare Other | Admitting: Family Medicine

## 2021-03-10 ENCOUNTER — Encounter: Payer: Self-pay | Admitting: Family Medicine

## 2021-03-10 ENCOUNTER — Telehealth: Payer: Self-pay | Admitting: Family Medicine

## 2021-03-10 ENCOUNTER — Other Ambulatory Visit: Payer: Self-pay

## 2021-03-10 VITALS — BP 132/60 | HR 70 | Temp 97.6°F | Ht 60.5 in | Wt 104.2 lb

## 2021-03-10 DIAGNOSIS — Z7189 Other specified counseling: Secondary | ICD-10-CM

## 2021-03-10 DIAGNOSIS — L989 Disorder of the skin and subcutaneous tissue, unspecified: Secondary | ICD-10-CM

## 2021-03-10 DIAGNOSIS — R011 Cardiac murmur, unspecified: Secondary | ICD-10-CM

## 2021-03-10 DIAGNOSIS — N1831 Chronic kidney disease, stage 3a: Secondary | ICD-10-CM

## 2021-03-10 DIAGNOSIS — D649 Anemia, unspecified: Secondary | ICD-10-CM

## 2021-03-10 DIAGNOSIS — Z85038 Personal history of other malignant neoplasm of large intestine: Secondary | ICD-10-CM

## 2021-03-10 DIAGNOSIS — Z23 Encounter for immunization: Secondary | ICD-10-CM

## 2021-03-10 DIAGNOSIS — Z0000 Encounter for general adult medical examination without abnormal findings: Secondary | ICD-10-CM | POA: Diagnosis not present

## 2021-03-10 DIAGNOSIS — K922 Gastrointestinal hemorrhage, unspecified: Secondary | ICD-10-CM

## 2021-03-10 DIAGNOSIS — H4020X0 Unspecified primary angle-closure glaucoma, stage unspecified: Secondary | ICD-10-CM

## 2021-03-10 DIAGNOSIS — E118 Type 2 diabetes mellitus with unspecified complications: Secondary | ICD-10-CM

## 2021-03-10 DIAGNOSIS — M85851 Other specified disorders of bone density and structure, right thigh: Secondary | ICD-10-CM

## 2021-03-10 DIAGNOSIS — Z8739 Personal history of other diseases of the musculoskeletal system and connective tissue: Secondary | ICD-10-CM

## 2021-03-10 DIAGNOSIS — Z66 Do not resuscitate: Secondary | ICD-10-CM

## 2021-03-10 DIAGNOSIS — Z8673 Personal history of transient ischemic attack (TIA), and cerebral infarction without residual deficits: Secondary | ICD-10-CM

## 2021-03-10 DIAGNOSIS — I4891 Unspecified atrial fibrillation: Secondary | ICD-10-CM

## 2021-03-10 DIAGNOSIS — N2581 Secondary hyperparathyroidism of renal origin: Secondary | ICD-10-CM

## 2021-03-10 DIAGNOSIS — R4589 Other symptoms and signs involving emotional state: Secondary | ICD-10-CM

## 2021-03-10 DIAGNOSIS — I739 Peripheral vascular disease, unspecified: Secondary | ICD-10-CM

## 2021-03-10 DIAGNOSIS — I10 Essential (primary) hypertension: Secondary | ICD-10-CM

## 2021-03-10 DIAGNOSIS — G3184 Mild cognitive impairment, so stated: Secondary | ICD-10-CM

## 2021-03-10 DIAGNOSIS — Z794 Long term (current) use of insulin: Secondary | ICD-10-CM

## 2021-03-10 MED ORDER — SERTRALINE HCL 25 MG PO TABS: 25.0000 mg | ORAL_TABLET | Freq: Every day | ORAL | 6 refills | Status: DC

## 2021-03-10 MED ORDER — HYDRALAZINE HCL 25 MG PO TABS: 25.0000 mg | ORAL_TABLET | Freq: Two times a day (BID) | ORAL | 0 refills | Status: AC | PRN

## 2021-03-10 NOTE — Patient Instructions (Addendum)
Flu shot today  [place] job with keeping log! Blood pressures are overall doing better, continue current medicines. Let's add hydralazine as needed to take when blood pressure >160/100. May take up to twice a day.  If interested, check with pharmacy about new 2 shot shingles series (shingrix).  You are doing well today Return as needed or in 6 months for follow up visit.  We will refer you to [facility] for evaluation.  DNR form filled out to have at home.  Try sertraline 25mg  daily for mood. Let us know if any trouble with this.   Health Maintenance After Age 21 After age 29, you are at a higher risk for certain long-term diseases and infections as well as injuries from falls. Falls are a major cause of broken bones and head injuries in people who are older than age 40. Getting regular preventive care can help to keep you healthy and well. Preventive care includes getting regular testing and making lifestyle changes as recommended by your health care provider. Talk with your health care provider about: Which screenings and tests you should have. A screening is a test that checks for a disease when you have no symptoms. A diet and exercise plan that is right for you. What should I know about screenings and tests to prevent falls? Screening and testing are the best ways to find a health problem early. Early diagnosis and treatment give you the best chance of managing medical conditions that are common after age 8. Certain conditions and lifestyle choices may make you more likely to have a fall. Your health care provider may recommend: Regular vision checks. Poor vision and conditions such as cataracts can make you more likely to have a fall. If you wear glasses, make sure to get your prescription updated if your vision changes. Medicine review. Work with your health care provider to regularly review all of the medicines you are taking, including over-the-counter medicines. Ask your health care  provider about any side effects that may make you more likely to have a fall. Tell your health care provider if any medicines that you take make you feel dizzy or sleepy. Osteoporosis screening. Osteoporosis is a condition that causes the bones to get weaker. This can make the bones weak and cause them to break more easily. Blood pressure screening. Blood pressure changes and medicines to control blood pressure can make you feel dizzy. Strength and balance checks. Your health care provider may recommend certain tests to check your strength and balance while standing, walking, or changing positions. Foot health exam. Foot pain and numbness, as well as not wearing proper footwear, can make you more likely to have a fall. Depression screening. You may be more likely to have a fall if you have a fear of falling, feel emotionally low, or feel unable to do activities that you used to do. Alcohol use screening. Using too much alcohol can affect your balance and may make you more likely to have a fall. What actions can I take to lower my risk of falls? General instructions Talk with your health care provider about your risks for falling. Tell your health care provider if: You fall. Be sure to tell your health care provider about all falls, even ones that seem minor. You feel dizzy, sleepy, or off-balance. Take over-the-counter and prescription medicines only as told by your health care provider. These include any supplements. Eat a healthy diet and maintain a healthy weight. A healthy diet includes low-fat dairy products,  low-fat (lean) meats, and fiber from whole grains, beans, and lots of fruits and vegetables. Home safety Remove any tripping hazards, such as rugs, cords, and clutter. Install safety equipment such as grab bars in bathrooms and safety rails on stairs. Keep rooms and walkways well-lit. Activity  Follow a regular exercise program to stay fit. This will help you maintain your balance. Ask  your health care provider what types of exercise are appropriate for you. If you need a cane or walker, use it as recommended by your health care provider. Wear supportive shoes that have nonskid soles. Lifestyle Do not drink alcohol if your health care provider tells you not to drink. If you drink alcohol, limit how much you have: 0-1 drink a day for women. 0-2 drinks a day for men. Be aware of how much alcohol is in your drink. In the [place] one drink equals one typical bottle of beer (12 oz), one-half glass of wine (5 oz), or one shot of hard liquor (1 oz). Do not use any products that contain nicotine or tobacco, such as cigarettes and e-cigarettes. If you need help quitting, ask your health care provider. Summary Having a healthy lifestyle and getting preventive care can help to protect your health and wellness after age 76. Screening and testing are the best way to find a health problem early and help you avoid having a fall. Early diagnosis and treatment give you the best chance for managing medical conditions that are more common for people who are older than age 68. Falls are a major cause of broken bones and head injuries in people who are older than age 83. Take precautions to prevent a fall at home. Work with your health care provider to learn what changes you can make to improve your health and wellness and to prevent falls. This information is not intended to replace advice given to you by your health care provider. Make sure you discuss any questions you have with your health care provider. Document Revised: 07/29/2020 Document Reviewed: 05/06/2020 Elsevier Patient Education  2022 [employer].

## 2021-03-10 NOTE — Progress Notes (Signed)
Patient ID: Taylor Castaneda, female    DOB: [birth year], 87 y.o.   MRN: [id number]  This visit was conducted in person.  BP 132/60   Pulse 70   Temp 97.6 F (36.4 C) (Temporal)   Ht 5' 0.5" (1.537 m)   Wt 104 lb 4 oz (47.3 kg)   SpO2 98%   BMI 20.02 kg/m    CC: AMW/CPE Subjective:   HPI: Taylor Castaneda is a 87 y.o. female presenting on 03/10/2021 for Medicare Wellness (Pt accompanied by daughter, Taylor Castaneda- temp 98.0.)   Did not see health advisor.   Hearing Screening   500Hz  1000Hz  2000Hz  4000Hz   Right ear 40 40 20 40  Left ear 40 25 20 0  Vision Screening - Comments:: Last eye exam, 11/2020.  [place] Office Visit from 03/10/2021 in [place] [facility]  PHQ-2 Total Score 3       Fall Risk  03/10/2021 08/12/2020 07/25/2020 07/05/2020 02/19/2020  Falls in the past year? 1 0 0 0 1  Comment - - - - -  Number falls in past yr: 0 - - 0 0  Comment tripped - - - -  Injury with Fall? 0 - - 0 0  Risk for fall due to : - - - - -  Follow up - - - - -  Tripped on stool overnight while walking with walker. No injury.   Notes increased fatigue.   Recent R MCA ACA watershed infarct treated with DAPT through 09/02/2020, now only on plavix.   HTN - currently on carvedilol 12.5mg  bid, losartan 100mg  daily, amlodipine 5mg  daily. They bring home BP log - overall adequate readings 130-150s/60-70s, HR 60s, few isolated to 341-937 systolic.   DM followed by endo (Taylor Castaneda) on lantus.   Known mild MR and aortic sclerosis without stenosis on latest echocardiogram 05/2020. Known PAD  Preventative: COLONOSCOPY Date: 06/2013 1 hyperplastic polyp, ileocecal anastomosis [facility]). Aged out. H/o colon cancer.  Well woman with OBGYN Dr Taylor Castaneda @ [facility] - last mammogram 2015. Last seen 2015. Declines return. She does breast exams at home without concerns. Declines rpt mammogram.  DEXA - Date: 04/2014 T score stable at -2.2 at hip. Calcium stopped by endo. Good calcium in diet. Declines  repeat at this time Flu yearly [place] 08/2019, 09/2019, booster 05/2020  Pneumovax 2012, prevnar-13 2015 Td 1998.  zostavax 2009  shingrix - discussed - has not received yet to check at pharmacist  Advanced directive - scanned and in chart 02/2018. HCPOA are Taylor Castaneda (daughter) then Taylor Castaneda (son). Does not want prolonged life support if terminal condition. Doesn't think she'd want feeding tube. Wants to be DNR.  Seat belt use discussed  Sunscreen use discussed. No changing moles on skin.  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 Interim medical history since our last visit reviewed. Allergies and medications reviewed and updated. Outpatient Medications Prior to Visit  Medication Sig Dispense Refill   acetaminophen (TYLENOL) 325 MG tablet Take 325-650 mg by mouth every 6 (six) hours as needed (for pain).  amLODipine (NORVASC) 5 MG tablet Take 1 tablet (5 mg total) by mouth daily. 30 tablet 6   atorvastatin (LIPITOR) 40 MG tablet Take 1 tablet (40 mg total) by mouth daily at 6 PM. 90 tablet 3   Biotin 5000 MCG TABS Take by mouth.     Carboxymethylcellul-Glycerin 1-0.25 % SOLN Place 1-2 drops into both eyes 3 (three) times daily as needed (for dry/irritated eyes.).     carvedilol (COREG) 12.5 MG tablet Take 1 tablet (12.5 mg total) by mouth 2 (two) times daily. 180 tablet 3   Cholecalciferol (VITAMIN D3) 25 MCG (1000 UT) CAPS Take 1 capsule (1,000 Units total) by mouth daily. 30 capsule 0   Cinnamon 500 MG capsule Take 1,000 mg by mouth daily.     clopidogrel (PLAVIX) 75 MG tablet Take 1 tablet (75 mg  total) by mouth daily. 90 tablet 4   colchicine 0.6 MG tablet Take 1 tablet (0.6 mg total) by mouth daily as needed (gout flare). First day of gout flare, may take 1 tablet twice daily. 30 tablet 3   CONTOUR NEXT TEST test strip 1 each 4 (four) times daily.     diclofenac Sodium (VOLTAREN) 1 % GEL Apply 2 g topically 4 (four) times daily. (Patient taking differently: Apply 2 g topically 4 (four) times daily. As needed) 2 g 0   Ferrous Sulfate (IRON) 325 (65 Fe) MG TABS Take 1 tablet (325 mg total) by mouth See admin instructions. Take 325 mg by mouth in the evening on Mondays and Thursdays only 30 tablet 0   insulin glargine (LANTUS) 100 UNIT/ML Solostar Pen Inject 8 Units into the skin daily. 15 mL 11   levETIRAcetam (KEPPRA) 750 MG tablet Take 1 tablet (750 mg total) by mouth 2 (two) times daily. 180 tablet 4   losartan (COZAAR) 100 MG tablet Take 1 tablet (100 mg total) by mouth daily. 30 tablet 3   Multiple Vitamins-Minerals (PRESERVISION AREDS 2 PO) Take 1 capsule by mouth 2 (two) times daily.     nystatin cream (MYCOSTATIN) Apply 1 application topically 2 (two) times daily. 80 g 0   pantoprazole (PROTONIX) 40 MG tablet Take 1 tablet by mouth twice daily 180 tablet 0   No facility-administered medications prior to visit.     Per HPI unless specifically indicated in ROS section below Review of Systems  Objective:  BP 132/60   Pulse 70   Temp 97.6 F (36.4 C) (Temporal)   Ht 5' 0.5" (1.537 m)   Wt 104 lb 4 oz (47.3 kg)   SpO2 98%   BMI 20.02 kg/m   Wt Readings from Last 3 Encounters:  03/10/21 104 lb 4 oz (47.3 kg)  10/21/20 103 lb (46.7 kg)  08/19/20 106 lb 3 oz (48.2 kg)      Physical Exam Vitals and nursing note reviewed.  Constitutional:      Appearance: Normal appearance. She is not ill-appearing.  HENT:     Head: Normocephalic and atraumatic.     Right Ear: Tympanic membrane, ear canal and external ear normal. There is no impacted cerumen.     Left Ear: Tympanic  membrane, ear canal and external ear normal. There is no impacted cerumen.  Eyes:     General:        Right eye: No discharge.        Left eye: No discharge.     Extraocular Movements: Extraocular movements intact.     Conjunctiva/sclera: Conjunctivae normal.  Pupils: Pupils are equal, round, and reactive to light.  Neck:     Thyroid: No thyroid mass or thyromegaly.     Vascular: Carotid bruit (?L sided - referred from murmur) present.  Cardiovascular:     Rate and Rhythm: Normal rate and regular rhythm.     Pulses: Normal pulses.     Heart sounds: Murmur (3/6 systolic) heard.  Pulmonary:     Effort: Pulmonary effort is normal. No respiratory distress.     Breath sounds: Normal breath sounds. No wheezing, rhonchi or rales.  Abdominal:     General: Bowel sounds are normal. There is no distension.     Palpations: Abdomen is soft. There is no mass.     Tenderness: There is no abdominal tenderness. There is no guarding or rebound.     Hernia: No hernia is present.  Musculoskeletal:     Cervical back: Normal range of motion and neck supple. No rigidity.     Right lower leg: No edema.     Left lower leg: No edema.  Lymphadenopathy:     Cervical: No cervical adenopathy.  Skin:    General: Skin is warm and dry.     Findings: No rash.  Neurological:     General: No focal deficit present.     Mental Status: She is alert. Mental status is at baseline.     Comments:  Recall 2/3, 2/3 with cue Calculation 3/5 DRW  Psychiatric:        Mood and Affect: Mood normal.        Behavior: Behavior normal.      Results for orders placed or performed in visit on 02/24/21  Phosphorus  Result Value Ref Range   Phosphorus 3.8 2.3 - 4.6 mg/dL  Parathyroid hormone, intact (no Ca)  Result Value Ref Range   PTH 45 16 - 77 pg/mL  CBC with Differential/Platelet  Result Value Ref Range   WBC 7.8 4.0 - 10.5 K/uL   RBC 3.81 (L) 3.87 - 5.11 Mil/uL   Hemoglobin 12.2 12.0 - 15.0 g/dL   HCT 36.1  36.0 - 46.0 %   MCV 94.8 78.0 - 100.0 fl   MCHC 33.7 30.0 - 36.0 g/dL   RDW 13.0 11.5 - 15.5 %   Platelets 171.0 150.0 - 400.0 K/uL   Neutrophils Relative % 69.3 43.0 - 77.0 %   Lymphocytes Relative 19.8 12.0 - 46.0 %   Monocytes Relative 7.4 3.0 - 12.0 %   Eosinophils Relative 2.8 0.0 - 5.0 %   Basophils Relative 0.7 0.0 - 3.0 %   Neutro Abs 5.4 1.4 - 7.7 K/uL   Lymphs Abs 1.5 0.7 - 4.0 K/uL   Monocytes Absolute 0.6 0.1 - 1.0 K/uL   Eosinophils Absolute 0.2 0.0 - 0.7 K/uL   Basophils Absolute 0.1 0.0 - 0.1 K/uL  Lipid panel  Result Value Ref Range   Cholesterol 126 0 - 200 mg/dL   Triglycerides 134.0 0.0 - 149.0 mg/dL   HDL 38.70 (L) >39.00 mg/dL   VLDL 26.8 0.0 - 40.0 mg/dL   LDL Cholesterol 60 0 - 99 mg/dL   Total CHOL/HDL Ratio 3    NonHDL 86.91   VITAMIN D 25 Hydroxy (Vit-D Deficiency, Fractures)  Result Value Ref Range   VITD 43.25 30.00 - 100.00 ng/mL  Uric acid  Result Value Ref Range   Uric Acid, Serum 4.2 2.4 - 7.0 mg/dL  Hemoglobin A1c  Result Value Ref Range   Hgb A1c MFr Bld  7.7 (H) 4.6 - 6.5 %  Comprehensive metabolic panel  Result Value Ref Range   Sodium 142 135 - 145 mEq/L   Potassium 4.2 3.5 - 5.1 mEq/L   Chloride 107 96 - 112 mEq/L   CO2 25 19 - 32 mEq/L   Glucose, Bld 148 (H) 70 - 99 mg/dL   BUN 37 (H) 6 - 23 mg/dL   Creatinine, Ser 1.03 0.40 - 1.20 mg/dL   Total Bilirubin 0.6 0.2 - 1.2 mg/dL   Alkaline Phosphatase 112 39 - 117 U/L   AST 18 0 - 37 U/L   ALT 15 0 - 35 U/L   Total Protein 6.3 6.0 - 8.3 g/dL   Albumin 3.9 3.5 - 5.2 g/dL   GFR 49.18 (L) >60.00 mL/min   Calcium 9.3 8.4 - 10.5 mg/dL  Extra Specimen  Result Value Ref Range   Extra tube recieved     Specimen type recieved Frozen Serum    Depression screen [facility] 2/9 03/10/2021 07/05/2020 02/19/2020 02/19/2020 02/26/2019  Decreased Interest 3 1 2 1  0  Down, Depressed, Hopeless 0 0 0 0 0  PHQ - 2 Score 3 1 2 1  0  Altered sleeping 3 2 2  - 0  Tired, decreased energy 2 2 2  - 0  Change in  appetite 3 2 2  - 0  Feeling bad or failure about yourself  0 0 0 - 0  Trouble concentrating 3 1 0 - 0  Moving slowly or fidgety/restless 1 0 0 - 0  Suicidal thoughts 0 0 0 - 0  PHQ-9 Score 15 8 8  - 0  Difficult doing work/chores - - - - Not difficult at all  Some recent data might be hidden    GAD 7 : Generalized Anxiety Score 03/10/2021 02/19/2020  Nervous, Anxious, on Edge 0 0  Control/stop worrying 0 0  Worry too much - different things 0 0  Trouble relaxing 0 0  Restless 0 0  Easily annoyed or irritable 0 0  Afraid - awful might happen 0 0  Total GAD 7 Score 0 0   Assessment & Plan:  This visit occurred during the SARS-CoV-2 public health emergency.  Safety protocols were in place, including screening questions prior to the visit, additional usage of staff PPE, and extensive cleaning of exam room while observing appropriate contact time as indicated for disinfecting solutions.   Problem List Items Addressed This Visit     Medicare annual wellness visit, subsequent - Primary (Chronic)    I have personally reviewed the Medicare Annual Wellness questionnaire and have noted 1. The patient's medical and social history 2. Their use of alcohol, tobacco or illicit drugs 3. Their current medications and supplements 4. The patient's functional ability including ADL's, fall risks, home safety risks and hearing or visual impairment. Cognitive function has been assessed and addressed as indicated.  5. Diet and physical activity 6. Evidence for depression or mood disorders The patients weight, height, BMI have been recorded in the chart. I have made referrals, counseling and provided education to the patient based on review of the above and I have provided the pt with a written personalized care plan for preventive services. Provider list updated.. See scanned questionairre as needed for further documentation. Reviewed preventative protocols and updated unless pt declined.       Advanced  care planning/counseling discussion (Chronic)    Advanced directive - scanned and in chart 02/2018. HCPOA are Taylor Castaneda (daughter) then Taylor Castaneda (son). Does not want  prolonged life support if terminal condition. Doesn't think she'd want feeding tube. Wants to be DNR.       Health maintenance examination (Chronic)    Preventative protocols reviewed and updated unless pt declined. Discussed healthy diet and lifestyle.       DNR (do not resuscitate) (Chronic)    Discussed, confirmed with pt.       Controlled diabetes mellitus type 2 with complications ([place])    Regularly sees endo, stable period with latest A1c 7.7%      Primary angle-closure glaucoma    Regularly sees eye doctor.       Essential hypertension    BP well controlled in office today however home log showing fluctuating readings, some up to 789-381 systolic. Will add hydralazine 25mg  BID PRN BP >160/100. Continue current regimen of carvedilol, losartan, amlodipine.       CKD (chronic kidney disease) stage 3, GFR 30-59 ml/min (HCC)    Recent Cr stable.  CBC without anemia, vit D and PTH levels normal.       Osteopenia    Declines repeat dexa.  Continues good calcium in diet.  Ambulates with walker - limited due to fall risk.      History of colon cancer    Desires to stop screening despite h/o colon cancer.       PAD (peripheral artery disease) (HCC)    H/o poorly healing pressure sore to R heel, did resolve with wound clinic care.  Saw [facility] 08/2020 - declined intervention at that time.       History of subcortical infarction 07/2020    Continue plavix, statin.       History of gout    Recent urate normal off allopurinol.  Uses colchicine PRN.       Transient atrial fibrillation (HCC)    Sounds regular today Off AC due to fall/bleeding risk      MCI (mild cognitive impairment)    Worse after CVA 08/2020, lives with son.       Secondary hyperparathyroidism of renal origin ([place])    PTH,  phosphorus recently normal. Continue to monitor.       Depressed mood    Daughter notes rare depressed mood since stroke - discussed this. Reasonable to try low dose sertraline - sent in.      Normocytic anemia    Improved on latest CBC.       GI bleed    H/o this early 2022  - continues daily pantoprazole       Skin lesion of right leg    Poorly healing wound to R upper shin present for months - years - concern for skin cancer vs other (pyogenic granuolma?). Will refer to dermatology       Relevant Orders   Ambulatory referral to Dermatology   Systolic murmur    Latest echo reviewed - mild MR, aortic valve sclerosis without stenosis.       Other Visit Diagnoses     Need for influenza vaccination       Relevant Orders   Flu Vaccine QUAD High Dose(Fluad) (Completed)        Meds ordered this encounter  Medications   sertraline (ZOLOFT) 25 MG tablet    Sig: Take 1 tablet (25 mg total) by mouth daily.    Dispense:  30 tablet    Refill:  6   Orders Placed This Encounter  Procedures   Flu Vaccine QUAD High Dose(Fluad)   Ambulatory referral to Dermatology  Referral Priority:   Routine    Referral Type:   Consultation    Referral Reason:   Specialty Services Required    Requested Specialty:   Dermatology    Number of Visits Requested:   1    Patient instructions: Flu shot today  Great job with keeping log! Blood pressures are overall doing better, continue current medicines. Let's add hydralazine as needed to take when blood pressure >160/100. May take up to twice a day.  If interested, check with pharmacy about new 2 shot shingles series (shingrix).  You are doing well today Return as needed or in 6 months for follow up visit.  We will refer you to [facility] for evaluation.   Follow up plan: Return in about 6 months (around 09/08/2021) for follow up visit.  [name], MD

## 2021-03-10 NOTE — Telephone Encounter (Signed)
Pt daughter called stating that the pharmacy didn't get the prescription for hydralazine.

## 2021-03-10 NOTE — Telephone Encounter (Signed)
I've sent this in for her.

## 2021-03-11 DIAGNOSIS — Z66 Do not resuscitate: Secondary | ICD-10-CM | POA: Insufficient documentation

## 2021-03-11 DIAGNOSIS — L989 Disorder of the skin and subcutaneous tissue, unspecified: Secondary | ICD-10-CM | POA: Insufficient documentation

## 2021-03-11 DIAGNOSIS — R011 Cardiac murmur, unspecified: Secondary | ICD-10-CM | POA: Insufficient documentation

## 2021-03-11 DIAGNOSIS — C44702 Unspecified malignant neoplasm of skin of right lower limb, including hip: Secondary | ICD-10-CM | POA: Insufficient documentation

## 2021-03-11 NOTE — Assessment & Plan Note (Signed)
Worse after CVA 08/2020, lives with son.

## 2021-03-11 NOTE — Assessment & Plan Note (Signed)
BP well controlled in office today however home log showing fluctuating readings, some up to 048-889 systolic. Will add hydralazine 25mg  BID PRN BP >160/100. Continue current regimen of carvedilol, losartan, amlodipine.

## 2021-03-11 NOTE — Assessment & Plan Note (Signed)
Preventative protocols reviewed and updated unless pt declined. Discussed healthy diet and lifestyle.  

## 2021-03-11 NOTE — Assessment & Plan Note (Signed)
Discussed, confirmed with pt.

## 2021-03-11 NOTE — Assessment & Plan Note (Signed)
Continue plavix, statin.

## 2021-03-11 NOTE — Assessment & Plan Note (Signed)
PTH, phosphorus recently normal. Continue to monitor.

## 2021-03-11 NOTE — Assessment & Plan Note (Signed)
H/o poorly healing pressure sore to R heel, did resolve with wound clinic care.  Saw [facility] 08/2020 - declined intervention at that time.

## 2021-03-11 NOTE — Assessment & Plan Note (Signed)
Poorly healing wound to R upper shin present for months - years - concern for skin cancer vs other (pyogenic granuolma?). Will refer to dermatology

## 2021-03-11 NOTE — Assessment & Plan Note (Signed)
Recent urate normal off allopurinol.  Uses colchicine PRN.

## 2021-03-11 NOTE — Assessment & Plan Note (Signed)
Advanced directive - scanned and in chart 02/2018. HCPOA are [name] (daughter) then Herma Carson (son). Does not want prolonged life support if terminal condition. Doesn't think she'd want feeding tube. Wants to be DNR.

## 2021-03-11 NOTE — Assessment & Plan Note (Signed)

## 2021-03-11 NOTE — Assessment & Plan Note (Signed)
Declines repeat dexa.  Continues good calcium in diet.  Ambulates with walker - limited due to fall risk.

## 2021-03-11 NOTE — Assessment & Plan Note (Signed)
Improved on latest CBC.

## 2021-03-11 NOTE — Assessment & Plan Note (Signed)
Regularly sees endo, stable period with latest A1c 7.7%

## 2021-03-11 NOTE — Assessment & Plan Note (Signed)
Latest echo reviewed - mild MR, aortic valve sclerosis without stenosis.

## 2021-03-11 NOTE — Assessment & Plan Note (Signed)
Desires to stop screening despite h/o colon cancer.

## 2021-03-11 NOTE — Assessment & Plan Note (Signed)
Daughter notes rare depressed mood since stroke - discussed this. Reasonable to try low dose sertraline - sent in.

## 2021-03-11 NOTE — Assessment & Plan Note (Addendum)
Recent Cr stable.  CBC without anemia, vit D and PTH levels normal.

## 2021-03-11 NOTE — Assessment & Plan Note (Signed)
Sounds regular today Off AC due to fall/bleeding risk

## 2021-03-11 NOTE — Assessment & Plan Note (Signed)
H/o this early 2022  - continues daily pantoprazole

## 2021-03-11 NOTE — Assessment & Plan Note (Signed)
Regularly sees eye doctor.

## 2021-03-13 ENCOUNTER — Encounter: Payer: Self-pay | Admitting: Family Medicine

## 2021-03-13 NOTE — Telephone Encounter (Signed)
PLEASE NOTE: All timestamps contained within this report are represented as [place] Standard Time. CONFIDENTIALTY NOTICE: This fax transmission is intended only for the addressee. It contains information that is legally privileged, confidential or otherwise protected from use or disclosure. If you are not the intended recipient, you are strictly prohibited from reviewing, disclosing, copying using or disseminating any of this information or taking any action in reliance on or regarding this information. If you have received this fax in error, please notify us immediately by telephone so that we can arrange for its return to [place]. Phone: [phone], Toll-Free: [phone], Fax: [phone] Page: 1 of 2 Call Id: [id number] [facility] - Client TELEPHONE ADVICE RECORD [facility] Patient Name: Taylor Castaneda [facility] Gender: Female DOB: Jan 01, 1933 Age: 87 Y 67 M 16 D Return Phone Number: [phone] (Primary), [phone] (Secondary) Address: City/ State/ Zip: [place]  [address] Client [facility] - Client Client Site [place] - Day Physician [name] - MD Contact Type Call Who Is Calling Patient / Member / Family / Caregiver Call Type Triage / Clinical Caller Name [name] Relationship To Patient Daughter Return Phone Number [phone] (Primary) Chief Complaint Medication reaction Reason for Call Symptomatic / Request for [place] states her mother began taking Zoloft yesterday (03/12/21) and has been zoning out and sleeping a lot since. Caller was transferred by the office. Translation No Nurse Assessment Nurse: Ysidro Evert, RN, Levada Dy Date/Time (Eastern Time): 03/13/2021 2:06:19 PM Confirm and document reason for call. If symptomatic, describe symptoms. ---Caller states his Mother had a seizure earlier and she is sleeping now. Does the patient have any new  or worsening symptoms? ---Yes Will a triage be completed? ---Yes Related visit to physician within the last 2 weeks? ---No Does the PT have any chronic conditions? (i.e. diabetes, asthma, this includes High risk factors for pregnancy, etc.) ---Yes List chronic conditions. ---seizure disorder, diabetes Is this a behavioral health or substance abuse call? ---No Guidelines Guideline Title Affirmed Question Affirmed Notes Nurse Date/Time (Eastern Time) Seizure [1] Seizure lasting < 5 minutes AND [2] history of prior seizure(s) AND [3] taking anticonvulsants Ysidro Evert, RN, Levada Dy 03/13/2021 2:08:03 PM Disp. Time Eilene Ghazi Time) Disposition Final User PLEASE NOTE: All timestamps contained within this report are represented as [place] Standard Time. CONFIDENTIALTY NOTICE: This fax transmission is intended only for the addressee. It contains information that is legally privileged, confidential or otherwise protected from use or disclosure. If you are not the intended recipient, you are strictly prohibited from reviewing, disclosing, copying using or disseminating any of this information or taking any action in reliance on or regarding this information. If you have received this fax in error, please notify us immediately by telephone so that we can arrange for its return to [place]. Phone: [phone], Toll-Free: [phone], Fax: [phone] Page: 2 of 2 Call Id: [id number] 03/13/2021 2:15:02 [place], RN, Marin Shutter Disagree/Comply Comply Caller Understands Yes PreDisposition Did not know what to do Care Advice Given Per Guideline HOME CARE: * You should be able to treat this at home. REASSURANCE AND EDUCATION - BRIEF SEIZURE LASTING LESS THAN 5 MINUTES: * This seizure did not last very long and there is a history of prior seizures. POST-ICTAL CONFUSION: * Most people who have a seizure will be confused and groggy for a period of time after the seizure stops. SLEEP: * Let the  person who had a seizure sleep, if they  wish. Reason: The brain is temporarily exhausted, and sleep is restorative. CALL BACK IF: * Another seizure occurs * Fever or severe headache * Wants to sleep over 2 hours (or longer than usual) * Patient becomes worse or you have more questions CARE ADVICE given per Seizure (Adult) guideline.

## 2021-03-13 NOTE — Telephone Encounter (Signed)
Spoke to patient's son [name] and was advised that his mom started a new medication Zoloft yesterday. [name] stated that his brother told him that his mom slept more than usual yesterday.  [name] stated that she is sleeping a lot today, but has been sleeping a lot the past 3-4 weeks. [name] stated that his mom was getting ready for her appointment with her diabetic doctor today but he called and rescheduled it. [name] stated that his mom seemed to be a little dazed and then started shaking. [name] stated that his mom is stable now and he is just going to monitor her. [name] stated that he checked her blood pressure at the time of the spell and it was 114/75 pulse 63. [name] was given ER precautions and he verbalized understanding.

## 2021-03-13 NOTE — Telephone Encounter (Signed)
Spoke with [name]  Seizure is not common with SSRIs but still a possible side effect.  Will stop sertraline for now

## 2021-03-14 ENCOUNTER — Encounter: Payer: Self-pay | Admitting: Family Medicine

## 2021-03-14 ENCOUNTER — Ambulatory Visit: Payer: Medicare Other | Admitting: Podiatry

## 2021-03-14 ENCOUNTER — Encounter (HOSPITAL_COMMUNITY): Payer: Self-pay | Admitting: Emergency Medicine

## 2021-03-14 ENCOUNTER — Other Ambulatory Visit: Payer: Self-pay

## 2021-03-14 ENCOUNTER — Observation Stay (HOSPITAL_COMMUNITY)
Admission: EM | Admit: 2021-03-14 | Discharge: 2021-03-15 | Disposition: A | Discharge status: Home / Self Care | Payer: Medicare Other | Attending: Internal Medicine | Admitting: Internal Medicine

## 2021-03-14 ENCOUNTER — Emergency Department (HOSPITAL_COMMUNITY): Payer: Medicare Other

## 2021-03-14 DIAGNOSIS — Z85038 Personal history of other malignant neoplasm of large intestine: Secondary | ICD-10-CM | POA: Diagnosis not present

## 2021-03-14 DIAGNOSIS — I10 Essential (primary) hypertension: Secondary | ICD-10-CM | POA: Diagnosis present

## 2021-03-14 DIAGNOSIS — R000 Tachycardia, unspecified: Secondary | ICD-10-CM

## 2021-03-14 DIAGNOSIS — E1122 Type 2 diabetes mellitus with diabetic chronic kidney disease: Secondary | ICD-10-CM | POA: Diagnosis not present

## 2021-03-14 DIAGNOSIS — Z20822 Contact with and (suspected) exposure to covid-19: Secondary | ICD-10-CM | POA: Insufficient documentation

## 2021-03-14 DIAGNOSIS — Z79899 Other long term (current) drug therapy: Secondary | ICD-10-CM | POA: Diagnosis not present

## 2021-03-14 DIAGNOSIS — R569 Unspecified convulsions: Secondary | ICD-10-CM

## 2021-03-14 DIAGNOSIS — N1832 Chronic kidney disease, stage 3b: Secondary | ICD-10-CM | POA: Diagnosis not present

## 2021-03-14 DIAGNOSIS — J90 Pleural effusion, not elsewhere classified: Secondary | ICD-10-CM | POA: Diagnosis not present

## 2021-03-14 DIAGNOSIS — I129 Hypertensive chronic kidney disease with stage 1 through stage 4 chronic kidney disease, or unspecified chronic kidney disease: Secondary | ICD-10-CM | POA: Insufficient documentation

## 2021-03-14 DIAGNOSIS — N183 Chronic kidney disease, stage 3 unspecified: Secondary | ICD-10-CM | POA: Diagnosis present

## 2021-03-14 DIAGNOSIS — Z8673 Personal history of transient ischemic attack (TIA), and cerebral infarction without residual deficits: Secondary | ICD-10-CM

## 2021-03-14 DIAGNOSIS — Z794 Long term (current) use of insulin: Secondary | ICD-10-CM | POA: Diagnosis not present

## 2021-03-14 DIAGNOSIS — I4892 Unspecified atrial flutter: Principal | ICD-10-CM | POA: Diagnosis present

## 2021-03-14 DIAGNOSIS — I739 Peripheral vascular disease, unspecified: Secondary | ICD-10-CM | POA: Diagnosis present

## 2021-03-14 DIAGNOSIS — I6330 Cerebral infarction due to thrombosis of unspecified cerebral artery: Secondary | ICD-10-CM | POA: Diagnosis present

## 2021-03-14 DIAGNOSIS — Z87891 Personal history of nicotine dependence: Secondary | ICD-10-CM | POA: Diagnosis not present

## 2021-03-14 DIAGNOSIS — E118 Type 2 diabetes mellitus with unspecified complications: Secondary | ICD-10-CM | POA: Diagnosis present

## 2021-03-14 LAB — CBC WITH DIFFERENTIAL/PLATELET
Abs Immature Granulocytes: 0.03 10*3/uL (ref 0.00–0.07)
Basophils Absolute: 0.1 10*3/uL (ref 0.0–0.1)
Basophils Relative: 1 %
Eosinophils Absolute: 0.2 10*3/uL (ref 0.0–0.5)
Eosinophils Relative: 2 %
HCT: 36.9 % (ref 36.0–46.0)
Hemoglobin: 12.2 g/dL (ref 12.0–15.0)
Immature Granulocytes: 0 %
Lymphocytes Relative: 27 %
Lymphs Abs: 2.5 10*3/uL (ref 0.7–4.0)
MCH: 31.8 pg (ref 26.0–34.0)
MCHC: 33.1 g/dL (ref 30.0–36.0)
MCV: 96.1 fL (ref 80.0–100.0)
Monocytes Absolute: 0.8 10*3/uL (ref 0.1–1.0)
Monocytes Relative: 9 %
Neutro Abs: 5.9 10*3/uL (ref 1.7–7.7)
Neutrophils Relative %: 61 %
Platelets: 214 10*3/uL (ref 150–400)
RBC: 3.84 MIL/uL — ABNORMAL LOW (ref 3.87–5.11)
RDW: 11.9 % (ref 11.5–15.5)
WBC: 9.6 10*3/uL (ref 4.0–10.5)
nRBC: 0 % (ref 0.0–0.2)

## 2021-03-14 LAB — BASIC METABOLIC PANEL
Anion gap: 10 (ref 5–15)
BUN: 45 mg/dL — ABNORMAL HIGH (ref 8–23)
CO2: 21 mmol/L — ABNORMAL LOW (ref 22–32)
Calcium: 9.1 mg/dL (ref 8.9–10.3)
Chloride: 109 mmol/L (ref 98–111)
Creatinine, Ser: 1.23 mg/dL — ABNORMAL HIGH (ref 0.44–1.00)
GFR, Estimated: 43 mL/min — ABNORMAL LOW (ref >60)
Glucose, Bld: 197 mg/dL — ABNORMAL HIGH (ref 70–99)
Potassium: 4.4 mmol/L (ref 3.5–5.1)
Sodium: 140 mmol/L (ref 135–145)

## 2021-03-14 LAB — MAGNESIUM: Magnesium: 2.1 mg/dL (ref 1.7–2.4)

## 2021-03-14 LAB — I-STAT CHEM 8, ED
BUN: 43 mg/dL — ABNORMAL HIGH (ref 8–23)
Calcium, Ion: 1.1 mmol/L — ABNORMAL LOW (ref 1.15–1.40)
Chloride: 111 mmol/L (ref 98–111)
Creatinine, Ser: 1.2 mg/dL — ABNORMAL HIGH (ref 0.44–1.00)
Glucose, Bld: 185 mg/dL — ABNORMAL HIGH (ref 70–99)
HCT: 33 % — ABNORMAL LOW (ref 36.0–46.0)
Hemoglobin: 11.2 g/dL — ABNORMAL LOW (ref 12.0–15.0)
Potassium: 4.3 mmol/L (ref 3.5–5.1)
Sodium: 141 mmol/L (ref 135–145)
TCO2: 20 mmol/L — ABNORMAL LOW (ref 22–32)

## 2021-03-14 LAB — TSH: TSH: 4.261 u[IU]/mL (ref 0.350–4.500)

## 2021-03-14 LAB — TROPONIN I (HIGH SENSITIVITY): Troponin I (High Sensitivity): 14 ng/L (ref <18)

## 2021-03-14 MED ORDER — DILTIAZEM LOAD VIA INFUSION
10.0000 mg | Freq: Once | INTRAVENOUS | Status: AC
  Administered 2021-03-14: 10 mg via INTRAVENOUS
  Filled 2021-03-14: qty 10

## 2021-03-14 MED ORDER — DILTIAZEM HCL-DEXTROSE 125-5 MG/125ML-% IV SOLN (PREMIX)
5.0000 mg/h | INTRAVENOUS | Status: DC
  Administered 2021-03-14: 5 mg/h via INTRAVENOUS
  Filled 2021-03-14: qty 125

## 2021-03-14 MED ORDER — SODIUM CHLORIDE 0.9 % IV BOLUS
500.0000 mL | Freq: Once | INTRAVENOUS | Status: AC
  Administered 2021-03-14: 500 mL via INTRAVENOUS

## 2021-03-14 NOTE — ED Provider Notes (Signed)
Emergency Medicine Provider Triage Evaluation Note  Taylor Castaneda , a 87 y.o. female  was evaluated in triage.  Pt complains of an elevated HR. She is completely asymptomatic. Family keeps a log of her vitals at home which have been normal up until today  Review of Systems  Positive: tachycardia Negative: Palpitations, chest pain, sob  Physical Exam  BP 131/90 (BP Location: Left Arm)   Pulse (!) 138   SpO2 100%  Gen:   Awake, no distress   Resp:  Normal effort  MSK:   Moves extremities without difficulty Other:  tachycardic  Medical Decision Making  Medically screening exam initiated at 9:03 PM.  Appropriate orders placed.  Taylor Castaneda was informed that the remainder of the evaluation will be completed by another provider, this initial triage assessment does not replace that evaluation, and the importance of remaining in the ED until their evaluation is complete.     Bishop Dublin 03/14/21 2109    Margette Fast, MD 03/14/21 2216

## 2021-03-14 NOTE — ED Provider Notes (Signed)
[facility] EMERGENCY DEPARTMENT Provider Note   CSN: [id number] Arrival date & time: 03/14/21  1946     History Chief Complaint  Patient presents with   Tachycardia    Taylor Castaneda is a 87 y.o. female.  HPI Patient presents with tachycardia.  She has a remote history of atrial fibrillation.  She was seen by cardiology November 2020.  This episode followed a seizure.  She was not started on anticoagulation due to fall risk.  Her medications include 12.5 twice daily Coreg.  Patient tachycardia was found incidentally at home while checking her blood pressure this morning at 9 AM.  Patient reports no symptoms.  Prior to 9 AM, her blood pressure and heart rate were last checked at 9:30 PM last night.  At that time, heart rate was normal.  This morning, heart rate was found to be in the range of 135.  She was also hypotensive on home blood pressure measurements.  She contacted her primary care doctor who told her to come into the ED.  Patient has had a rapid heart rate throughout the day today.  She continues to deny any symptoms.  She had an episode yesterday that may have been a seizure.  She has otherwise been in her normal state of health.    Past Medical History:  Diagnosis Date   Anemia    Cerebral infarction ([place]) 05/2020   Diabetes mellitus, type II ([place])    Glaucoma    Dr.Hecker   History of colon cancer 1998   Dr. Lennie Hummer   Hyperlipemia    Osteopenia 02/2012, 03/2014   DEXA hip -2.2   Renal insufficiency    Seizures ([place])    Stenosing tenosynovitis of finger of left hand 2016   index - s/p steroid injection x2   Stroke [facility])     Patient Active Problem List   Diagnosis Date Noted   Atrial flutter with rapid ventricular response ([place]) 03/15/2021   DNR (do not resuscitate) 03/11/2021   Skin lesion of right leg 24/26/8341   Systolic murmur 96/22/2979   Hereditary and idiopathic neuropathy, unspecified 08/15/2020   Long term (current) use of insulin  ([place]) 08/15/2020   Proteinuria 08/15/2020   Intertrigo 07/29/2020   GI bleed    Pressure injury of left heel, unstageable ([place]) 06/12/2020   Cerebral thrombosis with cerebral infarction 06/03/2020   Common bile duct (CBD) obstruction    Choledocholithiasis 05/30/2020   Cholelithiasis 05/30/2020   Hyperbilirubinemia 05/30/2020   Normocytic anemia 05/30/2020   Pancreatic duct dilated 05/30/2020   Dry age-related macular degeneration 05/06/2020   Secondary hyperparathyroidism of renal origin ([place]) 02/20/2020   Depressed mood 02/20/2020   Seizure ([place]) 03/18/2019   Transient atrial fibrillation ([place]) 03/18/2019   MCI (mild cognitive impairment) 03/18/2019   History of gout 04/29/2017   OA (osteoarthritis) of knee 12/17/2016   History of subcortical infarction 07/2020 06/13/2016   Health maintenance examination 01/30/2016   Medicare annual wellness visit, subsequent 03/10/2014   Advanced care planning/counseling discussion 03/10/2014   PAD (peripheral artery disease) ([place]) 11/11/2013   Dyspnea 09/18/2011   PMR (polymyalgia rheumatica) ([place]) 08/27/2011   Controlled diabetes mellitus type 2 with complications ([place]) 89/21/1939   Essential hypertension 06/13/2009   CKD (chronic kidney disease) stage 3, GFR 30-59 ml/min (HCC) 01/24/2009   Primary angle-closure glaucoma 11/13/2007   Osteopenia 11/13/2007   History of colon cancer 11/13/2007   Hyperglycemia due to type 2 diabetes mellitus ([place]) 10/23/2006  Past Surgical History:  Procedure Laterality Date   ABI  11/2013   WNL   APPENDECTOMY     BILIARY DILATION  05/31/2020   Procedure: BILIARY DILATION;  Surgeon: Jackquline Denmark, MD;  Location: [facility] ENDOSCOPY;  Service: Endoscopy;;   BIOPSY  05/31/2020   Procedure: BIOPSY;  Surgeon: Jackquline Denmark, MD;  Location: [facility] ENDOSCOPY;  Service: Endoscopy;;   CARPAL TUNNEL RELEASE     Bilateral    CATARACT EXTRACTION Bilateral 2006   Implants   CHOLECYSTECTOMY N/A 06/01/2020   Procedure:  LAPAROSCOPIC CHOLECYSTECTOMY;  Surgeon: Clovis Riley, MD;  Location: [place] OR;  Service: General;  Laterality: N/A;   [place]   For cancer   COLONOSCOPY  2008   Dr.Stark, Due 2011   COLONOSCOPY  06/2013   1 hyperplastic polyp, ileocecal anastomosis [facility])   dexa  02/2012, 03/2014   T score -2.2 at hip overall stable   ENDOSCOPIC RETROGRADE CHOLANGIOPANCREATOGRAPHY (ERCP) WITH PROPOFOL N/A 05/31/2020   Procedure: ENDOSCOPIC RETROGRADE CHOLANGIOPANCREATOGRAPHY (ERCP) WITH PROPOFOL;  Surgeon: Jackquline Denmark, MD;  Location[facility] END[facility]COPY;  Service: Endoscopy;  Laterality: N/A;   REFRACTIVE SURGERY     REMOVAL OF STONES  05/31/2020   Procedure: REMOVAL OF STONES;  Surgeon: Jackquline Denmark, MD;  Location: [facility] ENDOSCOPY;  Service: Endoscopy;;   SPHINCTEROTOMY  05/31/2020   Procedure: [name];  Surgeon: Jackquline Denmark, MD;  Location: [facility];  Service: Endoscopy;;   TONSILLECTOMY AND ADENOIDECTOMY     TOTAL KNEE ARTHROPLASTY Left 12/17/2016   Procedure: LEFT TOTAL KNEE ARTHROPLASTY;  Surgeon: Gaynelle Arabian, MD;  Location: [facility] ORS;  Service: Orthopedics;  Laterality: Left;     OB History   No obstetric history on file.     Family History  Problem Relation Age of Onset   Heart attack Father 61   Stroke Father 66   Diabetes Father    Hypertension Father    Heart disease Father        before age 24   Breast cancer Mother    Cancer Mother    Diabetes Sister    Cancer Sister    Peripheral vascular disease Sister    Breast cancer Sister    Hypertension Son    COPD Neg Hx    Asthma Neg Hx    Colon cancer Neg Hx    Esophageal cancer Neg Hx    Rectal cancer Neg Hx    Stomach cancer Neg Hx     Social History   Tobacco Use   Smoking status: Former    Years: 2.00    Types: Cigarettes    Quit date: 06/04/1973    Years since quitting: 47.8   Smokeless tobacco: Never   Tobacco comments:    Smoked for 5 years 1965-1970 up to 1/2 pp week  Vaping Use   Vaping Use: Never  used  Substance Use Topics   Alcohol use: Not Currently   Drug use: No    Home Medications Prior to Admission medications   Medication Sig Start Date End Date Taking? Authorizing Provider  acetaminophen (TYLENOL) 325 MG tablet Take 325-650 mg by mouth every 6 (six) hours as needed (for pain).    [provider]  amLODipine (NORVASC) 5 MG tablet Take 1 tablet (5 mg total) by mouth daily. 09/30/20   [name], MD  atorvastatin (LIPITOR) 40 MG tablet Take 1 tablet (40 mg total) by mouth daily at 6 PM. 06/23/20   Angiulli, Lavon Paganini, PA-C  Biotin 5000 MCG TABS  Take by mouth.    [provider]  Carboxymethylcellul-Glycerin 1-0.25 % SOLN Place 1-2 drops into both eyes 3 (three) times daily as needed (for dry/irritated eyes.).    [provider]  carvedilol (COREG) 12.5 MG tablet Take 1 tablet (12.5 mg total) by mouth 2 (two) times daily. 06/23/20   Angiulli, Lavon Paganini, PA-C  Cholecalciferol (VITAMIN D3) 25 MCG (1000 UT) CAPS Take 1 capsule (1,000 Units total) by mouth daily. 06/23/20   Angiulli, Lavon Paganini, PA-C  Cinnamon 500 MG capsule Take 1,000 mg by mouth daily.    [provider]  clopidogrel (PLAVIX) 75 MG tablet Take 1 tablet (75 mg total) by mouth daily. 07/25/20   Penumalli, Earlean Polka, MD  colchicine 0.6 MG tablet Take 1 tablet (0.6 mg total) by mouth daily as needed (gout flare). First day of gout flare, may take 1 tablet twice daily. 07/13/20   [name], MD  CONTOUR NEXT TEST test strip 1 each 4 (four) times daily. 07/12/20   [provider]  diclofenac Sodium (VOLTAREN) 1 % GEL Apply 2 g topically 4 (four) times daily. Patient taking differently: Apply 2 g topically 4 (four) times daily. As needed 06/23/20   Angiulli, Lavon Paganini, PA-C  Ferrous Sulfate (IRON) 325 (65 Fe) MG TABS Take 1 tablet (325 mg total) by mouth See admin instructions. Take 325 mg by mouth in the evening on Mondays and Thursdays only 06/23/20   Angiulli, Lavon Paganini, PA-C   hydrALAZINE (APRESOLINE) 25 MG tablet Take 1 tablet (25 mg total) by mouth 2 (two) times daily as needed (BP > 160/100). 03/10/21   [name], MD  insulin glargine (LANTUS) 100 UNIT/ML Solostar Pen Inject 8 Units into the skin daily. 06/24/20   Angiulli, Lavon Paganini, PA-C  levETIRAcetam (KEPPRA) 750 MG tablet Take 1 tablet (750 mg total) by mouth 2 (two) times daily. 07/25/20   Penumalli, Earlean Polka, MD  losartan (COZAAR) 100 MG tablet Take 1 tablet (100 mg total) by mouth daily. 02/07/21   [name], MD  Multiple Vitamins-Minerals (PRESERVISION AREDS 2 PO) Take 1 capsule by mouth 2 (two) times daily.    [provider]  nystatin cream (MYCOSTATIN) Apply 1 application topically 2 (two) times daily. 07/29/20   [name], MD  pantoprazole (PROTONIX) 40 MG tablet Take 1 tablet by mouth twice daily 11/24/20   [name], MD    Allergies    Gabapentin, Sertraline, and Pravastatin sodium  Review of Systems   Review of Systems  Constitutional:  Negative for activity change, chills, diaphoresis, fatigue and fever.  HENT:  Negative for congestion, ear pain and sore throat.   Eyes:  Negative for pain and visual disturbance.  Respiratory:  Negative for cough, chest tightness, shortness of breath and wheezing.   Cardiovascular:  Negative for chest pain, palpitations and leg swelling.  Gastrointestinal:  Negative for abdominal pain, nausea and vomiting.  Genitourinary:  Negative for dysuria, flank pain, hematuria and pelvic pain.  Musculoskeletal:  Negative for arthralgias, back pain, myalgias and neck pain.  Skin:  Negative for color change and rash.  Neurological:  Negative for dizziness, seizures, syncope, weakness, light-headedness, numbness and headaches.  Hematological:  Does not bruise/bleed easily.  Psychiatric/Behavioral:  Negative for decreased concentration and dysphoric mood.   All other systems reviewed and are negative.  Physical Exam Updated Vital  Signs BP (!) 141/97   Pulse 89   Temp 98 F (36.7 C) (Oral)   Resp (!) 22   Ht  5' (1.524 m)   Wt 51 kg   SpO2 98%   BMI 21.96 kg/m   Physical Exam Vitals and nursing note reviewed.  Constitutional:      General: She is not in acute distress.    Appearance: Normal appearance. She is well-developed and normal weight. She is not ill-appearing, toxic-appearing or diaphoretic.  HENT:     Head: Normocephalic and atraumatic.     Right Ear: External ear normal.     Left Ear: External ear normal.     Nose: Nose normal.     Mouth/Throat:     Mouth: Mucous membranes are moist.     Pharynx: Oropharynx is clear.  Eyes:     Extraocular Movements: Extraocular movements intact.     Conjunctiva/sclera: Conjunctivae normal.  Cardiovascular:     Rate and Rhythm: Regular rhythm. Tachycardia present.     Heart sounds: No murmur heard. Pulmonary:     Effort: Pulmonary effort is normal. No respiratory distress.     Breath sounds: Normal breath sounds. No wheezing or rales.  Chest:     Chest wall: No tenderness.  Abdominal:     Palpations: Abdomen is soft.     Tenderness: There is no abdominal tenderness.  Musculoskeletal:        General: Normal range of motion.     Cervical back: Normal range of motion and neck supple. No rigidity or tenderness.     Right lower leg: No edema.     Left lower leg: No edema.  Skin:    General: Skin is warm and dry.     Coloration: Skin is not jaundiced or pale.  Neurological:     General: No focal deficit present.     Mental Status: She is alert. Mental status is at baseline.     Cranial Nerves: No cranial nerve deficit.     Sensory: No sensory deficit.     Motor: No weakness.     Coordination: Coordination normal.  Psychiatric:        Mood and Affect: Mood normal.        Behavior: Behavior normal.    ED Results / Procedures / Treatments   Labs (all labs ordered are listed, but only abnormal results are displayed) Labs Reviewed  CBC WITH  DIFFERENTIAL/PLATELET - Abnormal; Notable for the following components:      Result Value   RBC 3.84 (*)    All other components within normal limits  BASIC METABOLIC PANEL - Abnormal; Notable for the following components:   CO2 21 (*)    Glucose, Bld 197 (*)    BUN 45 (*)    Creatinine, Ser 1.23 (*)    GFR, Estimated 43 (*)    All other components within normal limits  I-STAT CHEM 8, ED - Abnormal; Notable for the following components:   BUN 43 (*)    Creatinine, Ser 1.20 (*)    Glucose, Bld 185 (*)    Calcium, Ion 1.10 (*)    TCO2 20 (*)    Hemoglobin 11.2 (*)    HCT 33.0 (*)    All other components within normal limits  TSH  MAGNESIUM  URINALYSIS, ROUTINE W REFLEX MICROSCOPIC  BASIC METABOLIC PANEL  CBC  TROPONIN I (HIGH SENSITIVITY)  TROPONIN I (HIGH SENSITIVITY)    EKG EKG Interpretation  Date/Time:  Tuesday March 14 2021 23:05:31 EDT Ventricular Rate:  109 PR Interval:    QRS Duration: 93 QT Interval:  363 QTC Calculation: 392 R  Axis:   75 Text Interpretation: Atrial flutter w/ variable conduction Low voltage, precordial leads Borderline repolarization abnormality Confirmed by Godfrey Pick [phone]) on 03/14/2021 11:15:29 PM  Radiology DG Chest 2 View  Result Date: 03/14/2021 CLINICAL DATA:  Tachycardia EXAM: CHEST - 2 VIEW COMPARISON:  06/13/2020 FINDINGS: The lungs are well expanded. Small right pleural effusion. The lungs are clear. No pneumothorax. No pleural effusion on the left. Cardiac size within normal limits. Pulmonary vascularity is normal. Osseous structures are age-appropriate. IMPRESSION: Small right pleural effusion. Electronically Signed   By: Fidela Salisbury M.D.   On: 03/14/2021 21:34    Procedures Procedures   Medications Ordered in ED Medications  diltiazem (CARDIZEM) 1 mg/mL load via infusion 10 mg (10 mg Intravenous Bolus from Bag 03/14/21 2250)    And  diltiazem (CARDIZEM) 125 mg in dextrose 5% 125 mL (1 mg/mL) infusion (5 mg/hr  Intravenous New Bag/Given 03/14/21 2249)  colchicine tablet 0.6 mg (has no administration in time range)  atorvastatin (LIPITOR) tablet 40 mg (has no administration in time range)  pantoprazole (PROTONIX) EC tablet 40 mg (has no administration in time range)  clopidogrel (PLAVIX) tablet 75 mg (has no administration in time range)  levETIRAcetam (KEPPRA) tablet 750 mg (has no administration in time range)  acetaminophen (TYLENOL) tablet 650 mg (has no administration in time range)  ondansetron (ZOFRAN) injection 4 mg (has no administration in time range)  sodium chloride 0.9 % bolus 500 mL (0 mLs Intravenous Stopped 03/14/21 2355)    ED Course  I have reviewed the triage vital signs and the nursing notes.  Pertinent labs & imaging results that were available during my care of the patient were reviewed by me and considered in my medical decision making (see chart for details).    MDM Rules/Calculators/A&P                         CRITICAL CARE Performed by: Godfrey Pick   Total critical care time: 35 minutes  Critical care time was exclusive of separately billable procedures and treating other patients.  Critical care was necessary to treat or prevent imminent or life-threatening deterioration.  Critical care was time spent personally by me on the following activities: development of treatment plan with patient and/or surrogate as well as nursing, discussions with consultants, evaluation of patient's response to treatment, examination of patient, obtaining history from patient or surrogate, ordering and performing treatments and interventions, ordering and review of laboratory studies, ordering and review of radiographic studies, pulse oximetry and re-evaluation of patient's condition.   Patient presents for tachycardia.  This was found incidentally at home during a blood pressure check.  She has had a heart rate of 135-140 throughout the day, since 9 AM.  She denies any symptoms.  Blood  pressure is normal upon arrival.  EKG was obtained which did show atrial flutter with 2-1 conduction.  Per chart review, she did have transient A. fib in November 2020.  She was not started on a blood thinner due to fall risk.  She does take Coreg, 12.5 mg twice daily.  Labs were obtained.  IV access was obtained under ultrasound guidance.  Patient was given 10 mg of IV diltiazem and started on a diltiazem gtt.  She subsequently had normalization of heart rate in the 90s.  Blood pressure remained normal.  Repeat EKG showed what appears to be atrial flutter with variable AV conduction.  Patient's electrolytes were found to be normal.  Initial troponin was 14.  I spoke with cardiology who recommends heparinization and admission.  They advised to keep her on the diltiazem gtt. for now.  Tomorrow morning, they will have discussions about initiation of other medications.  Patient was agreeable to this plan.  Heparin was ordered.  Patient was admitted to hospitalist for further management. Final Clinical Impression(s) / ED Diagnoses Final diagnoses:  Tachycardia    Rx / DC Orders ED Discharge Orders          Ordered    Amb referral to [facility]        03/14/21 2154             Godfrey Pick, MD 03/15/21 0020

## 2021-03-14 NOTE — Telephone Encounter (Signed)
Spoke with Taylor Castaneda - pt has been sleeping all day. Also spoke with other daughter [name].  They will ask RN friend to pass by the house to recheck BP. If staying low and HR high, advised they seek ER care today. Concern she's had cardiac or cerebrovascular event causing sudden drop in BP.  Please call tomorrow for update on status.

## 2021-03-14 NOTE — ED Triage Notes (Signed)
Patient's daughter reported that patient's HR=138 at home this evening , denies chest pain , no SOB , denies dizziness or lightheaded .

## 2021-03-15 ENCOUNTER — Observation Stay (HOSPITAL_BASED_OUTPATIENT_CLINIC_OR_DEPARTMENT_OTHER): Payer: Medicare Other

## 2021-03-15 ENCOUNTER — Telehealth: Payer: Self-pay

## 2021-03-15 DIAGNOSIS — N1832 Chronic kidney disease, stage 3b: Secondary | ICD-10-CM | POA: Diagnosis not present

## 2021-03-15 DIAGNOSIS — I4892 Unspecified atrial flutter: Secondary | ICD-10-CM | POA: Diagnosis present

## 2021-03-15 DIAGNOSIS — I480 Paroxysmal atrial fibrillation: Secondary | ICD-10-CM | POA: Diagnosis not present

## 2021-03-15 DIAGNOSIS — Z8673 Personal history of transient ischemic attack (TIA), and cerebral infarction without residual deficits: Secondary | ICD-10-CM

## 2021-03-15 DIAGNOSIS — Z794 Long term (current) use of insulin: Secondary | ICD-10-CM

## 2021-03-15 DIAGNOSIS — I6330 Cerebral infarction due to thrombosis of unspecified cerebral artery: Secondary | ICD-10-CM | POA: Diagnosis not present

## 2021-03-15 DIAGNOSIS — R569 Unspecified convulsions: Secondary | ICD-10-CM

## 2021-03-15 DIAGNOSIS — E118 Type 2 diabetes mellitus with unspecified complications: Secondary | ICD-10-CM

## 2021-03-15 DIAGNOSIS — I10 Essential (primary) hypertension: Secondary | ICD-10-CM

## 2021-03-15 DIAGNOSIS — I739 Peripheral vascular disease, unspecified: Secondary | ICD-10-CM

## 2021-03-15 LAB — CBG MONITORING, ED
Glucose-Capillary: 152 mg/dL — ABNORMAL HIGH (ref 70–99)
Glucose-Capillary: 157 mg/dL — ABNORMAL HIGH (ref 70–99)
Glucose-Capillary: 191 mg/dL — ABNORMAL HIGH (ref 70–99)

## 2021-03-15 LAB — URINALYSIS, ROUTINE W REFLEX MICROSCOPIC
Bacteria, UA: NONE SEEN
Bilirubin Urine: NEGATIVE
Glucose, UA: NEGATIVE mg/dL
Hgb urine dipstick: NEGATIVE
Ketones, ur: NEGATIVE mg/dL
Leukocytes,Ua: NEGATIVE
Nitrite: NEGATIVE
Protein, ur: 30 mg/dL — AB
Specific Gravity, Urine: 1.016 (ref 1.005–1.030)
pH: 5 (ref 5.0–8.0)

## 2021-03-15 LAB — ECHOCARDIOGRAM COMPLETE
Height: 60 in
P 1/2 time: 459 msec
S' Lateral: 1.7 cm
Weight: 1798.95 oz

## 2021-03-15 LAB — BASIC METABOLIC PANEL
Anion gap: 8 (ref 5–15)
BUN: 39 mg/dL — ABNORMAL HIGH (ref 8–23)
CO2: 20 mmol/L — ABNORMAL LOW (ref 22–32)
Calcium: 8.4 mg/dL — ABNORMAL LOW (ref 8.9–10.3)
Chloride: 109 mmol/L (ref 98–111)
Creatinine, Ser: 1.13 mg/dL — ABNORMAL HIGH (ref 0.44–1.00)
GFR, Estimated: 47 mL/min — ABNORMAL LOW (ref >60)
Glucose, Bld: 171 mg/dL — ABNORMAL HIGH (ref 70–99)
Potassium: 3.7 mmol/L (ref 3.5–5.1)
Sodium: 137 mmol/L (ref 135–145)

## 2021-03-15 LAB — RESP PANEL BY RT-PCR (FLU A&B, COVID) ARPGX2
Influenza A by PCR: NEGATIVE
Influenza B by PCR: NEGATIVE
SARS Coronavirus 2 by RT PCR: NEGATIVE

## 2021-03-15 LAB — HEPARIN LEVEL (UNFRACTIONATED): Heparin Unfractionated: 1.05 IU/mL — ABNORMAL HIGH (ref 0.30–0.70)

## 2021-03-15 LAB — CBC
HCT: 33.5 % — ABNORMAL LOW (ref 36.0–46.0)
Hemoglobin: 11 g/dL — ABNORMAL LOW (ref 12.0–15.0)
MCH: 31.9 pg (ref 26.0–34.0)
MCHC: 32.8 g/dL (ref 30.0–36.0)
MCV: 97.1 fL (ref 80.0–100.0)
Platelets: 177 10*3/uL (ref 150–400)
RBC: 3.45 MIL/uL — ABNORMAL LOW (ref 3.87–5.11)
RDW: 12 % (ref 11.5–15.5)
WBC: 10.2 10*3/uL (ref 4.0–10.5)
nRBC: 0 % (ref 0.0–0.2)

## 2021-03-15 LAB — TROPONIN I (HIGH SENSITIVITY): Troponin I (High Sensitivity): 13 ng/L (ref <18)

## 2021-03-15 MED ORDER — ONDANSETRON HCL 4 MG/2ML IJ SOLN: 4.0000 mg | Freq: Four times a day (QID) | INTRAMUSCULAR | Status: DC | PRN

## 2021-03-15 MED ORDER — INSULIN ASPART 100 UNIT/ML IJ SOLN
0.0000 [IU] | Freq: Three times a day (TID) | INTRAMUSCULAR | Status: DC
  Administered 2021-03-15 (×2): 2 [IU] via SUBCUTANEOUS

## 2021-03-15 MED ORDER — PANTOPRAZOLE SODIUM 40 MG PO TBEC: 40.0000 mg | DELAYED_RELEASE_TABLET | Freq: Two times a day (BID) | ORAL | Status: DC

## 2021-03-15 MED ORDER — HEPARIN (PORCINE) 25000 UT/250ML-% IV SOLN
700.0000 [IU]/h | INTRAVENOUS | Status: DC
  Administered 2021-03-15: 700 [IU]/h via INTRAVENOUS
  Filled 2021-03-15: qty 250

## 2021-03-15 MED ORDER — CARVEDILOL 25 MG PO TABS: 25.0000 mg | ORAL_TABLET | Freq: Two times a day (BID) | ORAL | 0 refills | Status: DC

## 2021-03-15 MED ORDER — APIXABAN 2.5 MG PO TABS
2.5000 mg | ORAL_TABLET | Freq: Two times a day (BID) | ORAL | Status: DC
  Administered 2021-03-15: 2.5 mg via ORAL
  Filled 2021-03-15 (×3): qty 1

## 2021-03-15 MED ORDER — ATORVASTATIN CALCIUM 40 MG PO TABS: 40.0000 mg | ORAL_TABLET | Freq: Every day | ORAL | Status: DC

## 2021-03-15 MED ORDER — LOSARTAN POTASSIUM 50 MG PO TABS
100.0000 mg | ORAL_TABLET | Freq: Every day | ORAL | Status: DC
  Administered 2021-03-15: 100 mg via ORAL
  Filled 2021-03-15: qty 2

## 2021-03-15 MED ORDER — APIXABAN 2.5 MG PO TABS: 2.5000 mg | ORAL_TABLET | Freq: Two times a day (BID) | ORAL | 0 refills | Status: DC

## 2021-03-15 MED ORDER — HEPARIN (PORCINE) 25000 UT/250ML-% IV SOLN
600.0000 [IU]/h | INTRAVENOUS | Status: DC
  Administered 2021-03-15: 600 [IU]/h via INTRAVENOUS

## 2021-03-15 MED ORDER — ACETAMINOPHEN 325 MG PO TABS: 650.0000 mg | ORAL_TABLET | ORAL | Status: DC | PRN

## 2021-03-15 MED ORDER — CARVEDILOL 12.5 MG PO TABS
25.0000 mg | ORAL_TABLET | Freq: Two times a day (BID) | ORAL | Status: DC
  Administered 2021-03-15: 25 mg via ORAL
  Filled 2021-03-15: qty 2

## 2021-03-15 MED ORDER — COLCHICINE 0.6 MG PO TABS: 0.6000 mg | ORAL_TABLET | Freq: Every day | ORAL | Status: DC | PRN

## 2021-03-15 MED ORDER — HEPARIN BOLUS VIA INFUSION
3000.0000 [IU] | Freq: Once | INTRAVENOUS | Status: AC
  Administered 2021-03-15: 3000 [IU] via INTRAVENOUS
  Filled 2021-03-15: qty 3000

## 2021-03-15 MED ORDER — LEVETIRACETAM 750 MG PO TABS
750.0000 mg | ORAL_TABLET | Freq: Two times a day (BID) | ORAL | Status: DC
  Administered 2021-03-15 (×2): 750 mg via ORAL
  Filled 2021-03-15 (×3): qty 1

## 2021-03-15 MED ORDER — CLOPIDOGREL BISULFATE 75 MG PO TABS
75.0000 mg | ORAL_TABLET | Freq: Every day | ORAL | Status: DC
  Administered 2021-03-15: 75 mg via ORAL
  Filled 2021-03-15: qty 1

## 2021-03-15 MED ORDER — INSULIN ASPART 100 UNIT/ML IJ SOLN: 0.0000 [IU] | Freq: Every day | INTRAMUSCULAR | Status: DC

## 2021-03-15 NOTE — Progress Notes (Signed)
  Echocardiogram 2D Echocardiogram has been performed.  Taylor Castaneda 03/15/2021, 9:25 AM

## 2021-03-15 NOTE — Telephone Encounter (Signed)
Spoke with pt's son, Taylor Castaneda asking for an update.  Says pt was admitted to [facility] yesterday.  They took her to ER due to elevated HR.

## 2021-03-15 NOTE — Telephone Encounter (Signed)
Per chart review tab pt was admitted to [facility]

## 2021-03-15 NOTE — Telephone Encounter (Signed)
[place] Night - Client TELEPHONE ADVICE RECORD [facility] Patient Name: PENNY ARRAMBIDE Gender: Female DOB: 1933-02-24 Age: 87 Y 55 M 17 D Return Phone Number: [phone] (Primary), [phone] (Secondary) Address: City/ State/ Zip: [place]  [address] Client [place] Night - Client Client Site [place] Physician [name] - MD Contact Type Call Who Is Calling Patient / Member / Family / Caregiver Call Type Triage / Clinical Caller Name [name] Relationship To Patient Daughter Return Phone Number [phone] (Primary) Chief Complaint Heart palpitations or irregular heartbeat Reason for Call Symptomatic / Request for [place] states her mothers heart rate has been elevated today. Caller was told to bring her to the ER if it was still elevated later in the day. Caller is looking to see if she needs to call 911 to bring her by ambulance and if the Dr can call ahead for her to the hospital. Translation No Nurse Assessment Nurse: Eugenio Hoes, RN, Jenny Reichmann Date/Time (Eastern Time): 03/14/2021 6:39:02 PM Confirm and document reason for call. If symptomatic, describe symptoms. ---Caller states that her mother's heart rate has been in the 130's all day. Caller states her mother's oxygen is 99%. Caller states that she is not acting abnormal. Denies chest pain or SOB Does the patient have any new or worsening symptoms? ---Yes Will a triage be completed? ---Yes Related visit to physician within the last 2 weeks? ---Yes Does the PT have any chronic conditions? (i.e. diabetes, asthma, this includes High risk factors for pregnancy, etc.) ---Yes List chronic conditions. ---DM, stroke last year, HTN Is this a behavioral health or substance abuse call? ---No Guidelines Guideline Title Affirmed Question Affirmed Notes Nurse Date/Time  (Eastern Time) Heart Rate and Heartbeat Questions Age > 60 years (Exception: brief heartbeat symptoms that went away and now feels well) Eugenio Hoes, RN, Jenny Reichmann 03/14/2021 6:42:41 PM PLEASE NOTE: All timestamps contained within this report are represented as [place] Standard Time. CONFIDENTIALTY NOTICE: This fax transmission is intended only for the addressee. It contains information that is legally privileged, confidential or otherwise protected from use or disclosure. If you are not the intended recipient, you are strictly prohibited from reviewing, disclosing, copying using or disseminating any of this information or taking any action in reliance on or regarding this information. If you have received this fax in error, please notify us immediately by telephone so that we can arrange for its return to [place]. Phone: [phone], Toll-Free: [phone], Fax: [phone] Page: 2 of 2 Call Id: [id number] [place]. Time Eilene Ghazi Time) Disposition Final User 03/14/2021 6:48:44 PM See HCP within 4 Hours (or PCP triage) Yes Eugenio Hoes, RN, [place] Disagree/Comply Comply Caller Understands Yes PreDisposition Did not know what to do Care Advice Given Per Guideline SEE HCP (OR PCP TRIAGE) WITHIN 4 HOURS: CALL BACK IF: * You become worse CARE ADVICE given per Heart Rate and Heartbeat Questions (Adult) guideline. BRING MEDICINES: * Please bring a list of your current medicines when you go to see the doctor. * It is also a good idea to bring the pill bottles too. This will help the doctor to make certain you are taking the right medicines and the right dose. * IF OFFICE WILL BE CLOSED AND NO PCP (PRIMARY CARE PROVIDER) SECOND-LEVEL TRIAGE: You need to be seen within the next 3 or 4 hours. A nearby Urgent Care Center [facility]) is often a good source of care. Another choice is to go  to the ED. Go sooner if you become worse. Comments User: Baird Cancer, RN Date/Time Eilene Ghazi Time): 03/14/2021 6:42:39 PM 118/60  manually Referrals [place]

## 2021-03-15 NOTE — H&P (Signed)
History and Physical   Taylor Castaneda:[id number] DOB: April 29, 1933 DOA: 03/14/2021  PCP: [name], MD   Patient coming from: Home  Chief Complaint: Elevated heart rate  HPI: Taylor Castaneda is a 87 y.o. female with medical history significant of CVA, CKD 3, diabetes, vascular degeneration, hypertension, history of colon cancer, gout, PAD, PMR, seizure, transient A. fib, anemia presenting with increased heart rate.  History obtained with assistance base and daughter.  Patient reports that checks her vitals at home along with him.  Her 9 AM heart rate was elevated into the 130s and also noted to have low blood pressures this morning.  She contacted her PCP who recommend that she be evaluated in the ED if vitals did not return to normal.  She does have a remote history of transient A. fib back in 2020 appears to be around the time she had a seizure.  At that time she was determined not to be placed on anticoagulation due to risk of falling.  She does report she had an episode yesterday that was witnessed by her son but consisted of her "zoning out "for around 5 minutes with maybe some mild shaking.  This is reportedly similar to her previous seizures though she has not had one in some time.  She denies fevers, chills, chest pain, shortness breath, abdominal pain constipation, diarrhea, nausea, vomiting   ED Course: Vital signs in the ED significant for initial heart rate in the 130s improved to the 80s now.  Blood pressure in the 704U to 889 systolic.  Lab work-up showed BMP with bicarb 29, BUN 45, creatinine stable 1.23 from baseline of around 1, glucose 197.  CBC within normal limits.  Troponin normal with repeat pending.  TSH normal.  Urinalysis pending.  Chest x-ray showed small right pleural effusion.  Magnesium level normal.  Patient was started on a dill drip in the ED and received a 500 cc bolus.  Cardiology is consulted and recommend continuing dill drip overnight starting a heparin  drip and they will evaluate in the morning.  Review of Systems: As per HPI otherwise all other systems reviewed and are negative.  Past Medical History:  Diagnosis Date   Anemia    Cerebral infarction ([place]) 05/2020   Diabetes mellitus, type II ([place])    Glaucoma    Dr.Hecker   History of colon cancer 1998   Dr. Lennie Hummer   Hyperlipemia    Osteopenia 02/2012, 03/2014   DEXA hip -2.2   Renal insufficiency    Seizures (HCC)    Stenosing tenosynovitis of finger of left hand 2016   index - s/p steroid injection x2   Stroke [facility])     Past Surgical History:  Procedure Laterality Date   ABI  11/2013   WNL   APPENDECTOMY     BILIARY DILATION  05/31/2020   Procedure: BILIARY DILATION;  Surgeon: Jackquline Denmark, MD;  Location[facility] END[facility]COPY;  Service: Endoscopy;;   BIOPSY  05/31/2020   Procedure: BIOPSY;  Surgeon: Jackquline Denmark, MD;  Location: [facility] ENDOSCOPY;  Service: Endoscopy;;   CARPAL TUNNEL RELEASE     Bilateral    CATARACT EXTRACTION Bilateral 2006   Implants   CHOLECYSTECTOMY N/A 06/01/2020   Procedure: LAPAROSCOPIC CHOLECYSTECTOMY;  Surgeon: Clovis Riley, MD;  Location: [facility] OR;  Service: General;  Laterality: N/A;   [place]   For cancer   COLONOSCOPY  2008   Dr.Stark, Due 2011   COLONOSCOPY  06/2013  1 hyperplastic polyp, ileocecal anastomosis [facility])   dexa  02/2012, 03/2014   T score -2.2 at hip overall stable   ENDOSCOPIC RETROGRADE CHOLANGIOPANCREATOGRAPHY (ERCP) WITH PROPOFOL N/A 05/31/2020   Procedure: ENDOSCOPIC RETROGRADE CHOLANGIOPANCREATOGRAPHY (ERCP) WITH PROPOFOL;  Surgeon: Jackquline Denmark, MD;  Location: [facility] ENDOSCOPY;  Service: Endoscopy;  Laterality: N/A;   REFRACTIVE SURGERY     REMOVAL OF STONES  05/31/2020   Procedure: REMOVAL OF STONES;  Surgeon: Jackquline Denmark, MD;  Location: [facility] ENDOSCOPY;  Service: Endoscopy;;   SPHINCTEROTOMY  05/31/2020   Procedure: [name];  Surgeon: Jackquline Denmark, MD;  Location: [place];  Service: Endoscopy;;    TONSILLECTOMY AND ADENOIDECTOMY     TOTAL KNEE ARTHROPLASTY Left 12/17/2016   Procedure: LEFT TOTAL KNEE ARTHROPLASTY;  Surgeon: Gaynelle Arabian, MD;  Location: [facility] ORS;  Service: Orthopedics;  Laterality: Left;    Social History  reports that she quit smoking about 47 years ago. Her smoking use included cigarettes. She has never used smokeless tobacco. She reports that she does not currently use alcohol. She reports that she does not use drugs.  Allergies  Allergen Reactions   Gabapentin Other (See Comments)    Dizziness   Sertraline Other (See Comments)    Increased sedation, ?seizure activity   Pravastatin Sodium Other (See Comments)    "Anemia," per Dr Gilford Rile, [place]    Family History  Problem Relation Age of Onset   Heart attack Father 63   Stroke Father 66   Diabetes Father    Hypertension Father    Heart disease Father        before age 48   Breast cancer Mother    Cancer Mother    Diabetes Sister    Cancer Sister    Peripheral vascular disease Sister    Breast cancer Sister    Hypertension Son    COPD Neg Hx    Asthma Neg Hx    Colon cancer Neg Hx    Esophageal cancer Neg Hx    Rectal cancer Neg Hx    Stomach cancer Neg Hx   Reviewed on admission  Prior to Admission medications   Medication Sig Start Date End Date Taking? Authorizing Provider  acetaminophen (TYLENOL) 325 MG tablet Take 325-650 mg by mouth every 6 (six) hours as needed (for pain).    [provider]  amLODipine (NORVASC) 5 MG tablet Take 1 tablet (5 mg total) by mouth daily. 09/30/20   [name], MD  atorvastatin (LIPITOR) 40 MG tablet Take 1 tablet (40 mg total) by mouth daily at 6 PM. 06/23/20   Angiulli, Lavon Paganini, PA-C  Biotin 5000 MCG TABS Take by mouth.    [provider]  Carboxymethylcellul-Glycerin 1-0.25 % SOLN Place 1-2 drops into both eyes 3 (three) times daily as needed (for dry/irritated eyes.).    [provider]  carvedilol (COREG) 12.5 MG tablet  Take 1 tablet (12.5 mg total) by mouth 2 (two) times daily. 06/23/20   Angiulli, Lavon Paganini, PA-C  Cholecalciferol (VITAMIN D3) 25 MCG (1000 UT) CAPS Take 1 capsule (1,000 Units total) by mouth daily. 06/23/20   Angiulli, Lavon Paganini, PA-C  Cinnamon 500 MG capsule Take 1,000 mg by mouth daily.    [provider]  clopidogrel (PLAVIX) 75 MG tablet Take 1 tablet (75 mg total) by mouth daily. 07/25/20   Penumalli, Earlean Polka, MD  colchicine 0.6 MG tablet Take 1 tablet (0.6 mg total) by mouth daily as needed (gout flare). First day of gout flare,  may take 1 tablet twice daily. 07/13/20   [name], MD  CONTOUR NEXT TEST test strip 1 each 4 (four) times daily. 07/12/20   [provider]  diclofenac Sodium (VOLTAREN) 1 % GEL Apply 2 g topically 4 (four) times daily. Patient taking differently: Apply 2 g topically 4 (four) times daily. As needed 06/23/20   Angiulli, Lavon Paganini, PA-C  Ferrous Sulfate (IRON) 325 (65 Fe) MG TABS Take 1 tablet (325 mg total) by mouth See admin instructions. Take 325 mg by mouth in the evening on Mondays and Thursdays only 06/23/20   Angiulli, Lavon Paganini, PA-C  hydrALAZINE (APRESOLINE) 25 MG tablet Take 1 tablet (25 mg total) by mouth 2 (two) times daily as needed (BP > 160/100). 03/10/21   [name], MD  insulin glargine (LANTUS) 100 UNIT/ML Solostar Pen Inject 8 Units into the skin daily. 06/24/20   Angiulli, Lavon Paganini, PA-C  levETIRAcetam (KEPPRA) 750 MG tablet Take 1 tablet (750 mg total) by mouth 2 (two) times daily. 07/25/20   Penumalli, Earlean Polka, MD  losartan (COZAAR) 100 MG tablet Take 1 tablet (100 mg total) by mouth daily. 02/07/21   [name], MD  Multiple Vitamins-Minerals (PRESERVISION AREDS 2 PO) Take 1 capsule by mouth 2 (two) times daily.    [provider]  nystatin cream (MYCOSTATIN) Apply 1 application topically 2 (two) times daily. 07/29/20   [name], MD  pantoprazole (PROTONIX) 40 MG tablet Take 1 tablet by mouth twice daily  11/24/20   [name], MD    Physical Exam: Vitals:   03/14/21 2249 03/14/21 2300 03/14/21 2345 03/14/21 2350  BP: (!) 163/100 (!) 145/60 (!) 141/97   Pulse: (!) 135 86 (!) 135 89  Resp: 17 19 17  (!) 22  Temp:      TempSrc:      SpO2: 100% 100% 100% 98%  Weight:      Height:       Physical Exam Constitutional:      General: She is not in acute distress.    Appearance: Normal appearance.  HENT:     Head: Normocephalic and atraumatic.     Mouth/Throat:     Mouth: Mucous membranes are moist.     Pharynx: Oropharynx is clear.  Eyes:     Extraocular Movements: Extraocular movements intact.     Pupils: Pupils are equal, round, and reactive to light.  Cardiovascular:     Rate and Rhythm: Rhythm irregular.     Pulses: Normal pulses.     Heart sounds: Normal heart sounds.  Pulmonary:     Effort: Pulmonary effort is normal. No respiratory distress.     Breath sounds: Normal breath sounds.  Abdominal:     General: Bowel sounds are normal. There is no distension.     Palpations: Abdomen is soft.     Tenderness: There is no abdominal tenderness.  Musculoskeletal:        General: No swelling or deformity.  Skin:    General: Skin is warm and dry.  Neurological:     General: No focal deficit present.     Mental Status: Mental status is at baseline.   Labs on Admission: I have personally reviewed following labs and imaging studies  CBC: Recent Labs  Lab 03/14/21 2133 03/14/21 2228  WBC 9.6  --   NEUTROABS 5.9  --   HGB 12.2 11.2*  HCT 36.9 33.0*  MCV 96.1  --   PLT 214  --  Basic Metabolic Panel: Recent Labs  Lab 03/14/21 2133 03/14/21 2228  NA 140 141  K 4.4 4.3  CL 109 111  CO2 21*  --   GLUCOSE 197* 185*  BUN 45* 43*  CREATININE 1.23* 1.20*  CALCIUM 9.1  --   MG 2.1  --     GFR: Estimated Creatinine Clearance: 24.2 mL/min (A) (by C-G formula based on SCr of 1.2 mg/dL (H)).  Liver Function Tests: No results for input(s): AST, ALT, ALKPHOS,  BILITOT, PROT, ALBUMIN in the last 168 hours.  Urine analysis:    Component Value Date/Time   COLORURINE YELLOW 03/06/2019 0050   APPEARANCEUR CLEAR 03/06/2019 0050   LABSPEC >1.046 (H) 03/06/2019 0050   PHURINE 5.0 03/06/2019 0050   GLUCOSEU 50 (A) 03/06/2019 0050   HGBUR SMALL (A) 03/06/2019 0050   HGBUR large 06/08/2009 1435   BILIRUBINUR NEGATIVE 03/06/2019 0050   KETONESUR 20 (A) 03/06/2019 0050   PROTEINUR 30 (A) 03/06/2019 0050   UROBILINOGEN 0.2 03/08/2011 2011   NITRITE NEGATIVE 03/06/2019 0050   LEUKOCYTESUR NEGATIVE 03/06/2019 0050    Radiological Exams on Admission: DG Chest 2 View  Result Date: 03/14/2021 CLINICAL DATA:  Tachycardia EXAM: CHEST - 2 VIEW COMPARISON:  06/13/2020 FINDINGS: The lungs are well expanded. Small right pleural effusion. The lungs are clear. No pneumothorax. No pleural effusion on the left. Cardiac size within normal limits. Pulmonary vascularity is normal. Osseous structures are age-appropriate. IMPRESSION: Small right pleural effusion. Electronically Signed   By: Fidela Salisbury M.D.   On: 03/14/2021 21:34    EKG: Independently reviewed.  Initial EKG showed a flutter at 140 bpm with computer estimated QTC of 555 and questionable T wave abnormalities.  Repeat EKG showed a flutter now at 109 bpm and QTC improved to 392.  Assessment/Plan Active Problems:   Controlled diabetes mellitus type 2 with complications (HCC)   Essential hypertension   CKD (chronic kidney disease) stage 3, GFR 30-59 ml/min (HCC)   PAD (peripheral artery disease) (HCC)   History of subcortical infarction 07/2020   Seizure ([place])   Cerebral thrombosis with cerebral infarction   Atrial flutter with rapid ventricular response (HCC)  Atrial flutter with RVR > Remote history of transient A. fib around the time of the seizure in 2020. > Noted to be in the 130s asymptomatic when checking vitals this morning.  Found to be in a flutter in the ED. > Started on delta in the ED,  cardiology consulted recommended adding heparin and they will evaluate the patient in the morning. > Electrolytes normal in ED.  TSH normal.  Does report seizure-like episode yesterday. - Monitor in progressive unit - Appreciate cardiology recommendations - Continue diltiazem drip and heparin drip  History of seizures > Reports seizure-like episode yesterday.  Completely resolved no episodes since. - Continue home Keppra  Hypertension > Blood pressure initially borderline low but has improved once placed on diltiazem. - Hold home carvedilol and amlodipine until long-term A. fib regimen is decided - Continue home losartan in the morning  Diabetes - SSI   CKD 3B > Creatinine somewhat elevated but no AKI in the ED with current creatinine 1.23 from baseline of around 1. - Avoid nephrotoxic agents - Trend renal function electrolytes  PAD Hyperlipidemia History of stroke - Continue Plavix, Statin  DVT prophylaxis: Heparin  Code Status:   DNR Family Communication:  Daughter updated at bedside Disposition Plan:   Patient is from:  Home  Anticipated DC to:  Home  Anticipated  DC date:  1 to 3 days  Anticipated DC barriers: None  Consults called:  Cardiology, consulted by EDP.  We will see the patient morning.   Admission status:  Observation, progressive   Severity of Illness: The appropriate patient status for this patient is OBSERVATION. Observation status is judged to be reasonable and necessary in order to provide the required intensity of service to ensure the patient's safety. The patient's presenting symptoms, physical exam findings, and initial radiographic and laboratory data in the context of their medical condition is felt to place them at decreased risk for further clinical deterioration. Furthermore, it is anticipated that the patient will be medically stable for discharge from the hospital within 2 midnights of admission. The following factors support the patient status of  observation.   " The patient's presenting symptoms include tachycardia. " The physical exam findings include irregular heart rhythm. " The initial radiographic and laboratory data are Lab work-up showed BMP with bicarb 29, BUN 45, creatinine stable 1.23 from baseline of around 1, glucose 197.  CBC within normal limits.  Troponin normal with repeat pending.  TSH normal.  Urinalysis pending.  Chest x-ray showed small right pleural effusion.  Magnesium level normal.   Marcelyn Bruins MD [facility]  How to contact the [facility] Attending or Consulting provider [place] or covering provider during after hours [place], for this patient?   Check the care team in [facility] and look for a) attending/consulting [facility] provider listed and b) the [facility] team listed Log into [email or web address] and use [place]'s universal password to access. If you do not have the password, please contact the hospital operator. Locate the [facility] provider you are looking for under [facility] and page to a number that you can be directly reached. If you still have difficulty reaching the provider, please page the [facility] (Director on Call) for the Hospitalists listed on amion for assistance.  03/15/2021, 12:28 AM

## 2021-03-15 NOTE — ED Notes (Signed)
Breakfast order placed ?

## 2021-03-15 NOTE — ED Notes (Signed)
Heparin level is elevated at this time. Per pharmacy will stop heparin drip for now. Will recollect later this evening.

## 2021-03-15 NOTE — Progress Notes (Signed)
ANTICOAGULATION CONSULT NOTE - Initial Consult  Pharmacy Consult for Heparin Indication: atrial fibrillation  Allergies  Allergen Reactions   Gabapentin Other (See Comments)    Dizziness   Sertraline Other (See Comments)    Increased sedation, ?seizure activity   Pravastatin Sodium Other (See Comments)    "Anemia," per Dr Gilford Rile, [facility]    Patient Measurements: Height: 5' (152.4 cm) Weight: 51 kg (112 lb 7 oz) IBW/kg (Calculated) : 45.5  Vital Signs: Temp: 98 F (36.7 C) (10/11 2107) Temp Source: Oral (10/11 2107) BP: 141/97 (10/11 2345) Pulse Rate: 89 (10/11 2350)  Labs: Recent Labs    03/14/21 2133 03/14/21 2228  HGB 12.2 11.2*  HCT 36.9 33.0*  PLT 214  --   CREATININE 1.23* 1.20*  TROPONINIHS 14  --     Estimated Creatinine Clearance: 24.2 mL/min (A) (by C-G formula based on SCr of 1.2 mg/dL (H)).   Medical History: Past Medical History:  Diagnosis Date   Anemia    Cerebral infarction ([place]) 05/2020   Diabetes mellitus, type II ([place])    Glaucoma    Dr.Hecker   History of colon cancer 1998   Dr. Lennie Hummer   Hyperlipemia    Osteopenia 02/2012, 03/2014   DEXA hip -2.2   Renal insufficiency    Seizures (HCC)    Stenosing tenosynovitis of finger of left hand 2016   index - s/p steroid injection x2   Stroke [facility])     Medications:  No current facility-administered medications on file prior to encounter.   Current Outpatient Medications on File Prior to Encounter  Medication Sig Dispense Refill   acetaminophen (TYLENOL) 325 MG tablet Take 325-650 mg by mouth every 6 (six) hours as needed (for pain).     amLODipine (NORVASC) 5 MG tablet Take 1 tablet (5 mg total) by mouth daily. 30 tablet 6   atorvastatin (LIPITOR) 40 MG tablet Take 1 tablet (40 mg total) by mouth daily at 6 PM. 90 tablet 3   Biotin 5000 MCG TABS Take by mouth.     Carboxymethylcellul-Glycerin 1-0.25 % SOLN Place 1-2 drops into both eyes 3 (three) times daily as needed (for  dry/irritated eyes.).     carvedilol (COREG) 12.5 MG tablet Take 1 tablet (12.5 mg total) by mouth 2 (two) times daily. 180 tablet 3   Cholecalciferol (VITAMIN D3) 25 MCG (1000 UT) CAPS Take 1 capsule (1,000 Units total) by mouth daily. 30 capsule 0   Cinnamon 500 MG capsule Take 1,000 mg by mouth daily.     clopidogrel (PLAVIX) 75 MG tablet Take 1 tablet (75 mg total) by mouth daily. 90 tablet 4   colchicine 0.6 MG tablet Take 1 tablet (0.6 mg total) by mouth daily as needed (gout flare). First day of gout flare, may take 1 tablet twice daily. 30 tablet 3   CONTOUR NEXT TEST test strip 1 each 4 (four) times daily.     diclofenac Sodium (VOLTAREN) 1 % GEL Apply 2 g topically 4 (four) times daily. (Patient taking differently: Apply 2 g topically 4 (four) times daily. As needed) 2 g 0   Ferrous Sulfate (IRON) 325 (65 Fe) MG TABS Take 1 tablet (325 mg total) by mouth See admin instructions. Take 325 mg by mouth in the evening on Mondays and Thursdays only 30 tablet 0   hydrALAZINE (APRESOLINE) 25 MG tablet Take 1 tablet (25 mg total) by mouth 2 (two) times daily as needed (BP > 160/100). 60 tablet 0   insulin  glargine (LANTUS) 100 UNIT/ML Solostar Pen Inject 8 Units into the skin daily. 15 mL 11   levETIRAcetam (KEPPRA) 750 MG tablet Take 1 tablet (750 mg total) by mouth 2 (two) times daily. 180 tablet 4   losartan (COZAAR) 100 MG tablet Take 1 tablet (100 mg total) by mouth daily. 30 tablet 3   Multiple Vitamins-Minerals (PRESERVISION AREDS 2 PO) Take 1 capsule by mouth 2 (two) times daily.     nystatin cream (MYCOSTATIN) Apply 1 application topically 2 (two) times daily. 80 g 0   pantoprazole (PROTONIX) 40 MG tablet Take 1 tablet by mouth twice daily 180 tablet 0     Assessment: 87 y.o. female with Afib for heparin Goal of Therapy:  Heparin level 0.3-0.7 units/ml Monitor platelets by anticoagulation protocol: Yes   Plan:  Heparin 3000 units IV bolus, then start heparin  700 units/hr Check  heparin level in 8 hours.   Fredericka Bottcher, [place] Curb 03/15/2021,12:29 AM

## 2021-03-15 NOTE — Discharge Summary (Addendum)
Discharge Summary  Taylor Castaneda NFA:[id number] DOB: 21-Feb-1933  PCP: [name], MD  Admit date: 03/14/2021 Discharge date: 03/15/2021  Time spent: 30 mins, .daughter updated at bedside   Recommendations for Outpatient Follow-up:  F/u with PCP within a week  for hospital discharge follow up, repeat cbc/bmp at follow up F/u with cardiology on 10/28 Patient is advised to check blood pressure at home , bring in record to hospital discharge follow up appointment  New meds: eliquis Changed meds: coreg dose increased   Discharge Diagnoses:  Active Hospital Problems   Diagnosis Date Noted   Atrial flutter with rapid ventricular response ([place]) 03/15/2021   Cerebral thrombosis with cerebral infarction 06/03/2020   Seizure ([place]) 03/18/2019   History of subcortical infarction 07/2020 06/13/2016   PAD (peripheral artery disease) ([place]) 11/11/2013   Controlled diabetes mellitus type 2 with complications ([place]) 46/96/2952   Essential hypertension 06/13/2009   CKD (chronic kidney disease) stage 3, GFR 30-59 ml/min ([place]) 01/24/2009    Resolved Hospital Problems  No resolved problems to display.    Discharge Condition: stable  Diet recommendation: heart healthy/carb modified  Filed Weights   03/14/21 2106  Weight: 51 kg    History of present illness: ( per admitting MD Dr Trilby Drummer) Patient coming from: Home   Chief Complaint: Elevated heart rate   HPI: Taylor Castaneda is a 87 y.o. female with medical history significant of CVA, CKD 3, diabetes, vascular degeneration, hypertension, history of colon cancer, gout, PAD, PMR, seizure, transient A. fib, anemia presenting with increased heart rate.  History obtained with assistance base and daughter.  Patient reports that checks her vitals at home along with him.  Her 9 AM heart rate was elevated into the 130s and also noted to have low blood pressures this morning.  She contacted her PCP who recommend that she be evaluated in the ED if  vitals did not return to normal.  She does have a remote history of transient A. fib back in 2020 appears to be around the time she had a seizure.  At that time she was determined not to be placed on anticoagulation due to risk of falling.  She does report she had an episode yesterday that was witnessed by her son but consisted of her "zoning out "for around 5 minutes with maybe some mild shaking.  This is reportedly similar to her previous seizures though she has not had one in some time.   She denies fevers, chills, chest pain, shortness breath, abdominal pain constipation, diarrhea, nausea, vomiting    ED Course: Vital signs in the ED significant for initial heart rate in the 130s improved to the 80s now.  Blood pressure in the 841L to 244 systolic.  Lab work-up showed BMP with bicarb 29, BUN 45, creatinine stable 1.23 from baseline of around 1, glucose 197.  CBC within normal limits.  Troponin normal with repeat pending.  TSH normal.  Urinalysis pending.  Chest x-ray showed small right pleural effusion.  Magnesium level normal.  Patient was started on a dill drip in the ED and received a 500 cc bolus.  Cardiology is consulted and recommend continuing dill drip overnight starting a heparin drip and they will evaluate in the morning.    Hospital Course:  Active Problems:   Controlled diabetes mellitus type 2 with complications (HCC)   Essential hypertension   CKD (chronic kidney disease) stage 3, GFR 30-59 ml/min (HCC)   PAD (peripheral artery disease) (H[facility])   History of  subcortical infarction 07/2020   Seizure ([place])   Cerebral thrombosis with cerebral infarction   Atrial flutter with rapid ventricular response ([place])  PAF with aflutter/RVR She denies symptom Electrolytes normal in ED.  TSH normal Initially required cardizem drip and heparin drip Seen by cardiology recommended increase coreg and start eliquis She is cleared by cardiology to discharge home with close outpatient follow  up  HTN Blood pressure slightly elevated this morning, Coreg dose increased Continue the rest of home BP medication including Norvasc, losartan, as needed hydralazine  Insulin-dependent type 2 diabetes Last A1c 7.7, a.m. blood glucose 171 continue home insulin regimen Follow-up with endocrinology Dr. Chalmers Cater  H/o ischemic CVA Continue home meds , f/u with neurology  Seizures Reports seizure-like episode yesterday.  Completely resolved no episodes since.  Continue home Keppra F/u with neurology  CKD 3B Renal function at baseline Renal dosing medication  Code status: DNR  Procedures: None  Consultations: Cardiology  Discharge Exam: BP (!) 163/82   Pulse 96   Temp 98.4 F (36.9 C) (Oral)   Resp 18   Ht 5' (1.524 m)   Wt 51 kg   SpO2 100%   BMI 21.96 kg/m   General: NAD, pleasant Cardiovascular: IRRR Respiratory: Clear to auscultation bilaterally  Discharge Instructions You were cared for by a hospitalist during your hospital stay. If you have any questions about your discharge medications or the care you received while you were in the hospital after you are discharged, you can call the unit and asked to speak with the hospitalist on call if the hospitalist that took care of you is not available. Once you are discharged, your primary care physician will handle any further medical issues. Please note that NO REFILLS for any discharge medications will be authorized once you are discharged, as it is imperative that you return to your primary care physician (or establish a relationship with a primary care physician if you do not have one) for your aftercare needs so that they can reassess your need for medications and monitor your lab values.  Discharge Instructions     Amb referral to AFIB Clinic   Complete by: As directed    Diet - low sodium heart healthy   Complete by: As directed    Carb modified   Increase activity slowly   Complete by: As directed        Allergies as of 03/15/2021       Reactions   Gabapentin Other (See Comments)   Dizziness   Sertraline Other (See Comments)   Increased sedation, ?seizure activity   Pravastatin Sodium Other (See Comments)   "Anemia," per Dr Gilford Rile, [facility]        Medication List     TAKE these medications    acetaminophen 325 MG tablet Commonly known as: TYLENOL Take 325-650 mg by mouth every 6 (six) hours as needed (for pain).   amLODipine 5 MG tablet Commonly known as: NORVASC Take 1 tablet (5 mg total) by mouth daily.   apixaban 2.5 MG Tabs tablet Commonly known as: ELIQUIS Take 1 tablet (2.5 mg total) by mouth 2 (two) times daily.   atorvastatin 40 MG tablet Commonly known as: LIPITOR Take 1 tablet (40 mg total) by mouth daily at 6 PM.   Biotin 5000 MCG Tabs Take by mouth.   Carboxymethylcellul-Glycerin 1-0.25 % Soln Place 1-2 drops into both eyes 3 (three) times daily as needed (for dry/irritated eyes.).   carvedilol 25 MG tablet Commonly known as: COREG  Take 1 tablet (25 mg total) by mouth 2 (two) times daily with a meal. Start taking on: March 16, 2021 What changed:  medication strength how much to take when to take this   Cinnamon 500 MG capsule Take 1,000 mg by mouth daily.   clopidogrel 75 MG tablet Commonly known as: PLAVIX Take 1 tablet (75 mg total) by mouth daily.   colchicine 0.6 MG tablet Take 1 tablet (0.6 mg total) by mouth daily as needed (gout flare). First day of gout flare, may take 1 tablet twice daily.   Contour Next Test test strip Generic drug: glucose blood 1 each 4 (four) times daily.   diclofenac Sodium 1 % Gel Commonly known as: VOLTAREN Apply 2 g topically 4 (four) times daily. What changed: additional instructions   hydrALAZINE 25 MG tablet Commonly known as: APRESOLINE Take 1 tablet (25 mg total) by mouth 2 (two) times daily as needed (BP > 160/100).   insulin glargine 100 UNIT/ML Solostar Pen Commonly known as:  LANTUS Inject 8 Units into the skin daily.   Iron 325 (65 Fe) MG Tabs Take 1 tablet (325 mg total) by mouth See admin instructions. Take 325 mg by mouth in the evening on Mondays and Thursdays only   levETIRAcetam 750 MG tablet Commonly known as: KEPPRA Take 1 tablet (750 mg total) by mouth 2 (two) times daily.   losartan 100 MG tablet Commonly known as: Cozaar Take 1 tablet (100 mg total) by mouth daily.   nystatin cream Commonly known as: MYCOSTATIN Apply 1 application topically 2 (two) times daily.   pantoprazole 40 MG tablet Commonly known as: PROTONIX Take 1 tablet by mouth twice daily   PRESERVISION AREDS 2 PO Take 1 capsule by mouth 2 (two) times daily.   Vitamin D3 25 MCG (1000 UT) Caps Take 1 capsule (1,000 Units total) by mouth daily.       Allergies  Allergen Reactions   Gabapentin Other (See Comments)    Dizziness   Sertraline Other (See Comments)    Increased sedation, ?seizure activity   Pravastatin Sodium Other (See Comments)    "Anemia," per Dr Gilford Rile, [facility]    Follow-up Information     [place], [place], [place] Follow up on 03/31/2021.   Specialties: Cardiology, Radiology Why: 8:45 am for cardiology follow up appointment Contact information: [place] [address] [phone]         [name], MD Follow up in 1 week(s).   Specialty: Family Medicine Why: hospital discharge follow up, please check your blood pressure and heart rate at home, bring in record for your pcp and heart doctor to review Contact information: [place] [address] [phone]         Jacelyn Pi, MD Follow up.   Specialty: Endocrinology Contact information: [address] [place] [address] [phone]                  The results of significant diagnostics from this hospitalization (including imaging, microbiology, ancillary and laboratory) are listed below for  reference.    Significant Diagnostic Studies: DG Chest 2 View  Result Date: 03/14/2021 CLINICAL DATA:  Tachycardia EXAM: CHEST - 2 VIEW COMPARISON:  06/13/2020 FINDINGS: The lungs are well expanded. Small right pleural effusion. The lungs are clear. No pneumothorax. No pleural effusion on the left. Cardiac size within normal limits. Pulmonary vascularity is normal. Osseous structures are age-appropriate. IMPRESSION: Small right pleural effusion. Electronically Signed  By: Fidela Salisbury M.D.   On: 03/14/2021 21:34   ECHOCARDIOGRAM COMPLETE  Result Date: 03/15/2021    ECHOCARDIOGRAM REPORT   Patient Name:   Taylor Castaneda Date of Exam: 03/15/2021 Medical Rec #:  [id number]      Height:       60.0 in Accession #:    [id number]     Weight:       112.4 lb Date of Birth:  04/20/1933      BSA:          1.461 m Patient Age:    52 years       BP:           159/66 mmHg Patient Gender: F              HR:           87 bpm. Exam Location:  Inpatient Procedure: 2D Echo, Cardiac Doppler and Color Doppler Indications:    I48.92* Unspecified atrial flutter  History:        Patient has prior history of Echocardiogram examinations, most                 recent 06/03/2020. Stroke; Risk Factors:Diabetes and                 Dyslipidemia.  Sonographer:    Bernadene Person RDCS Referring Phys: [id number] [place]  1. Patient in afib during study.  2. Left ventricular ejection fraction, by estimation, is 60 to 65%. The left ventricle has normal function. The left ventricle has no regional wall motion abnormalities. Left ventricular diastolic parameters are indeterminate.  3. Right ventricular systolic function is normal. The right ventricular size is normal.  4. Left atrial size was mildly dilated.  5. The mitral valve is normal in structure. Trivial mitral valve regurgitation. No evidence of mitral stenosis.  6. The aortic valve is tricuspid. Aortic valve regurgitation is mild. Mild to moderate aortic valve  sclerosis/calcification is present, without any evidence of aortic stenosis.  7. The inferior vena cava is normal in size with greater than 50% respiratory variability, suggesting right atrial pressure of 3 mmHg. FINDINGS  Left Ventricle: Left ventricular ejection fraction, by estimation, is 60 to 65%. The left ventricle has normal function. The left ventricle has no regional wall motion abnormalities. The left ventricular internal cavity size was normal in size. There is  no left ventricular hypertrophy. Left ventricular diastolic parameters are indeterminate. Right Ventricle: The right ventricular size is normal. No increase in right ventricular wall thickness. Right ventricular systolic function is normal. Left Atrium: Left atrial size was mildly dilated. Right Atrium: Right atrial size was normal in size. Pericardium: There is no evidence of pericardial effusion. Mitral Valve: The mitral valve is normal in structure. There is mild thickening of the mitral valve leaflet(s). There is mild calcification of the mitral valve leaflet(s). Mild mitral annular calcification. Trivial mitral valve regurgitation. No evidence  of mitral valve stenosis. Tricuspid Valve: The tricuspid valve is normal in structure. Tricuspid valve regurgitation is not demonstrated. No evidence of tricuspid stenosis. Aortic Valve: The aortic valve is tricuspid. Aortic valve regurgitation is mild. Aortic regurgitation PHT measures 459 msec. Mild to moderate aortic valve sclerosis/calcification is present, without any evidence of aortic stenosis. Pulmonic Valve: The pulmonic valve was normal in structure. Pulmonic valve regurgitation is not visualized. No evidence of pulmonic stenosis. Aorta: The aortic root is normal in size and structure. Venous: The inferior vena cava is  normal in size with greater than 50% respiratory variability, suggesting right atrial pressure of 3 mmHg. IAS/Shunts: No atrial level shunt detected by color flow Doppler.  Additional Comments: Patient in afib during study.  LEFT VENTRICLE PLAX 2D LVIDd:         2.30 cm LVIDs:         1.70 cm LV PW:         1.10 cm LV IVS:        1.10 cm LVOT diam:     1.90 cm LV SV:         51 LV SV Index:   35 LVOT Area:     2.84 cm  RIGHT VENTRICLE TAPSE (M-mode): 1.0 cm LEFT ATRIUM             Index        RIGHT ATRIUM          Index LA diam:        3.10 cm 2.12 cm/m   RA Area:     9.07 cm LA Vol (A2C):   29.9 ml 20.46 ml/m  RA Volume:   15.60 ml 10.68 ml/m LA Vol (A4C):   38.4 ml 26.28 ml/m LA Biplane Vol: 33.9 ml 23.20 ml/m  AORTIC VALVE LVOT Vmax:   87.60 cm/s LVOT Vmean:  59.250 cm/s LVOT VTI:    0.181 m AI PHT:      459 msec  AORTA Ao Root diam: 2.50 cm Ao Asc diam:  2.50 cm  SHUNTS Systemic VTI:  0.18 m Systemic Diam: 1.90 cm Jenkins Rouge MD Electronically signed by Jenkins Rouge MD Signature Date/Time: 03/15/2021/10:19:26 AM    Final     Microbiology: Recent Results (from the past 240 hour(s))  Resp Panel by RT-PCR (Flu A&B, Covid) Nasopharyngeal Swab     Status: None   Collection Time: 03/15/21 12:21 AM   Specimen: Nasopharyngeal Swab; Nasopharyngeal(NP) swabs in vial transport medium  Result Value Ref Range Status   SARS Coronavirus 2 by RT PCR NEGATIVE NEGATIVE Final    Comment: (NOTE) SARS-CoV-2 target nucleic acids are NOT DETECTED.  The SARS-CoV-2 RNA is generally detectable in upper respiratory specimens during the acute phase of infection. The lowest concentration of SARS-CoV-2 viral copies this assay can detect is 138 copies/mL. A negative result does not preclude SARS-Cov-2 infection and should not be used as the sole basis for treatment or other patient management decisions. A negative result may occur with  improper specimen collection/handling, submission of specimen other than nasopharyngeal swab, presence of viral mutation(s) within the areas targeted by this assay, and inadequate number of viral copies(<138 copies/mL). A negative result must be  combined with clinical observations, patient history, and epidemiological information. The expected result is Negative.  Fact Sheet for Patients:  EntrepreneurPulse.com.au  Fact Sheet for Healthcare Providers:  IncredibleEmployment.be  This test is no t yet approved or cleared by the [place] FDA and  has been authorized for detection and/or diagnosis of SARS-CoV-2 by FDA under an Emergency Use Authorization (EUA). This EUA will remain  in effect (meaning this test can be used) for the duration of the COVID-19 declaration under Section 564(b)(1) of the Act, 21 U.S.C.section 360bbb-3(b)(1), unless the authorization is terminated  or revoked sooner.       Influenza A by PCR NEGATIVE NEGATIVE Final   Influenza B by PCR NEGATIVE NEGATIVE Final    Comment: (NOTE) The Xpert Xpress SARS-CoV-2/FLU/RSV plus assay is intended as an aid in the diagnosis of  influenza from Nasopharyngeal swab specimens and should not be used as a sole basis for treatment. Nasal washings and aspirates are unacceptable for Xpert Xpress SARS-CoV-2/FLU/RSV testing.  Fact Sheet for Patients: EntrepreneurPulse.com.au  Fact Sheet for Healthcare Providers: IncredibleEmployment.be  This test is not yet approved or cleared by the [place] FDA and has been authorized for detection and/or diagnosis of SARS-CoV-2 by FDA under an Emergency Use Authorization (EUA). This EUA will remain in effect (meaning this test can be used) for the duration of the COVID-19 declaration under Section 564(b)(1) of the Act, 21 U.S.C. section 360bbb-3(b)(1), unless the authorization is terminated or revoked.  Performed at [facility], [place] [address], [place], [place] [address]      Labs: Basic Metabolic Panel: Recent Labs  Lab 03/14/21 2133 03/14/21 2228 03/15/21 0338  NA 140 141 137  K 4.4 4.3 3.7  CL 109 111 109  CO2 21*  --  20*   GLUCOSE 197* 185* 171*  BUN 45* 43* 39*  CREATININE 1.23* 1.20* 1.13*  CALCIUM 9.1  --  8.4*  MG 2.1  --   --    Liver Function Tests: No results for input(s): AST, ALT, ALKPHOS, BILITOT, PROT, ALBUMIN in the last 168 hours. No results for input(s): LIPASE, AMYLASE in the last 168 hours. No results for input(s): AMMONIA in the last 168 hours. CBC: Recent Labs  Lab 03/14/21 2133 03/14/21 2228 03/15/21 0338  WBC 9.6  --  10.2  NEUTROABS 5.9  --   --   HGB 12.2 11.2* 11.0*  HCT 36.9 33.0* 33.5*  MCV 96.1  --  97.1  PLT 214  --  177   Cardiac Enzymes: No results for input(s): CKTOTAL, CKMB, CKMBINDEX, TROPONINI in the last 168 hours. BNP: BNP (last 3 results) No results for input(s): BNP in the last 8760 hours.  ProBNP (last 3 results) No results for input(s): PROBNP in the last 8760 hours.  CBG: Recent Labs  Lab 03/15/21 0132 03/15/21 0754 03/15/21 1142  GLUCAP 152* 157* 191*       Signed:  Florencia Reasons MD, PhD, FACP  [facility] 03/15/2021, 4:51 PM

## 2021-03-15 NOTE — Progress Notes (Signed)
ANTICOAGULATION CONSULT NOTE - Initial Consult  Pharmacy Consult for Heparin Indication: atrial fibrillation  Allergies  Allergen Reactions   Gabapentin Other (See Comments)    Dizziness   Sertraline Other (See Comments)    Increased sedation, ?seizure activity   Pravastatin Sodium Other (See Comments)    "Anemia," per Dr Gilford Rile, [facility]    Patient Measurements: Height: 5' (152.4 cm) Weight: 51 kg (112 lb 7 oz) IBW/kg (Calculated) : 45.5 Heparin dosing wt: 51 kg  Vital Signs: Temp: 97.6 F (36.4 C) (10/12 0756) Temp Source: Oral (10/12 0756) BP: 170/115 (10/12 0756) Pulse Rate: 108 (10/12 0756)  Labs: Recent Labs    03/14/21 2133 03/14/21 2228 03/14/21 2353 03/15/21 0338 03/15/21 0800  HGB 12.2 11.2*  --  11.0*  --   HCT 36.9 33.0*  --  33.5*  --   PLT 214  --   --  177  --   HEPARINUNFRC  --   --   --   --  1.05*  CREATININE 1.23* 1.20*  --  1.13*  --   TROPONINIHS 14  --  13  --   --      Estimated Creatinine Clearance: 25.7 mL/min (A) (by C-G formula based on SCr of 1.13 mg/dL (H)).   Medical History: Past Medical History:  Diagnosis Date   Anemia    Cerebral infarction ([place]) 05/2020   Diabetes mellitus, type II ([place])    Glaucoma    Dr.Hecker   History of colon cancer 1998   Dr. Lennie Hummer   Hyperlipemia    Osteopenia 02/2012, 03/2014   DEXA hip -2.2   Renal insufficiency    Seizures ([facility])    Stenosing tenosynovitis of finger of left hand 2016   index - s/p steroid injection x2   Stroke [facility])     Medications:  No current facility-administered medications on file prior to encounter.   Current Outpatient Medications on File Prior to Encounter  Medication Sig Dispense Refill   acetaminophen (TYLENOL) 325 MG tablet Take 325-650 mg by mouth every 6 (six) hours as needed (for pain).     amLODipine (NORVASC) 5 MG tablet Take 1 tablet (5 mg total) by mouth daily. 30 tablet 6   atorvastatin (LIPITOR) 40 MG tablet Take 1 tablet (40 mg total) by  mouth daily at 6 PM. 90 tablet 3   Biotin 5000 MCG TABS Take by mouth.     Carboxymethylcellul-Glycerin 1-0.25 % SOLN Place 1-2 drops into both eyes 3 (three) times daily as needed (for dry/irritated eyes.).     carvedilol (COREG) 12.5 MG tablet Take 1 tablet (12.5 mg total) by mouth 2 (two) times daily. 180 tablet 3   Cholecalciferol (VITAMIN D3) 25 MCG (1000 UT) CAPS Take 1 capsule (1,000 Units total) by mouth daily. 30 capsule 0   Cinnamon 500 MG capsule Take 1,000 mg by mouth daily.     clopidogrel (PLAVIX) 75 MG tablet Take 1 tablet (75 mg total) by mouth daily. 90 tablet 4   colchicine 0.6 MG tablet Take 1 tablet (0.6 mg total) by mouth daily as needed (gout flare). First day of gout flare, may take 1 tablet twice daily. 30 tablet 3   CONTOUR NEXT TEST test strip 1 each 4 (four) times daily.     diclofenac Sodium (VOLTAREN) 1 % GEL Apply 2 g topically 4 (four) times daily. (Patient taking differently: Apply 2 g topically 4 (four) times daily. As needed) 2 g 0   Ferrous Sulfate (IRON)  325 (65 Fe) MG TABS Take 1 tablet (325 mg total) by mouth See admin instructions. Take 325 mg by mouth in the evening on Mondays and Thursdays only 30 tablet 0   hydrALAZINE (APRESOLINE) 25 MG tablet Take 1 tablet (25 mg total) by mouth 2 (two) times daily as needed (BP > 160/100). 60 tablet 0   insulin glargine (LANTUS) 100 UNIT/ML Solostar Pen Inject 8 Units into the skin daily. 15 mL 11   levETIRAcetam (KEPPRA) 750 MG tablet Take 1 tablet (750 mg total) by mouth 2 (two) times daily. 180 tablet 4   losartan (COZAAR) 100 MG tablet Take 1 tablet (100 mg total) by mouth daily. 30 tablet 3   Multiple Vitamins-Minerals (PRESERVISION AREDS 2 PO) Take 1 capsule by mouth 2 (two) times daily.     nystatin cream (MYCOSTATIN) Apply 1 application topically 2 (two) times daily. 80 g 0   pantoprazole (PROTONIX) 40 MG tablet Take 1 tablet by mouth twice daily 180 tablet 0    Assessment: 87 y.o. female with Afib for heparin.  Cardiology consulted. Heparin level resulted at 1.05, higher than goal. CBC stable, no issues with IV site or oozing per RN.   Goal of Therapy:  Heparin level 0.3-0.7 units/ml Monitor platelets by anticoagulation protocol: Yes   Plan: Hold heparin infusion for 1 hour Decrease dose to 600 units/hr (~2u/kg/hr decrease) F/u 8h HL Monitor daily CBC and HL  Joetta Manners, PharmD, [facility] Emergency Medicine Clinical Pharmacist ED RPh Phone: [place]: [phone]

## 2021-03-15 NOTE — Consult Note (Addendum)
Cardiology Consultation:   Patient ID: Taylor Castaneda MRN: [id number]; DOB: 11-21-1932  Admit date: 03/14/2021 Date of Consult: 03/15/2021  PCP:  [name], MD   [facility] Providers Cardiologist:  Quay Burow, MD      Patient Profile:   Taylor Castaneda is a 87 y.o. female with a hx of hypertension, hyperlipidemia, prior tobacco use, type 2 diabetes, CKD stage III, seizure disorder, memory loss, anemia, CVA, paroxysmal atrial fibrillation not on anticoagulation due to fall risk, colon cancer s/p resection, who is being seen 03/15/2021 for the evaluation of atrial flutter with RVR at the request of Dr. Trilby Drummer.  History of Present Illness:   Ms. Sorn remotely saw Dr. Johnsie Cancel in 2013 for dyspnea on exertion, had nuclear stress test completed 09/26/2011 which was normal with EF 78%.  She was lost to follow-up since.  She was last seen by Dr. Gwenlyn Found 03/25/2019 via telemedicine visit as a new patient evaluation for atrial fibrillation.  She had no cardiovascular symptoms at the time.  Reportedly had a hospitalization for 3 days that time for grand mal seizure, went into atrial fibrillation transiently and spontaneously converted to NSR, she was not placed on oral anticoagulation due to fall risk.  She was maintained on carvedilol and Lotensin for blood pressure control, no adjustment of medication was made during above visit.   She was hospitalized on 05/2020 for seizure, found to have watershed CVA on MRI.  She last followed up with neurology in 07/25/2020, transitioning to Plavix 75 mg daily starting 09/02/2020 due to severe intracranial stenosis and Lipitor 40 mg daily.   She presented to the ER 03/14/2021 evening for tachycardia during blood pressure check.  She denied ever having any chest pain, SOB, dizziness, syncope, heart palpitation. She states she feels well and wants to go home. Family noted her heart rate was up to 130s yesterday since AM. Daughter states her BP has been high  lately and losartan was up-titrated by PCP.   She contacted PCP office that she was instructed come into the ER. She has family care 24/7 since her stroke, speech is difficult at times but recovering, able to ambulate with walker at baseline, not much falls since family is able to provide assistance. Daughter states she was concerned for anemia when she had cholecystomy, but never had any hematemesis/melena/BRBPR.   Admission diagnostic showed CKD stage III with creatinine 1.23 and GFR 43, bicarb 21 and BUN 45.  High sensitive troponin 14 >13.  CBC differential grossly unremarkable.  TSH WNL.  Flu and COVID-negative.  Chest x-ray with small right pleural effusion. Initial EKG showed a fib with RVR 109-140s. She was started on diltiazem gtt and heparin gtt, cardiology consult is requested today for further evaluation.       Past Medical History:  Diagnosis Date   Anemia    Cerebral infarction ([place]) 05/2020   Diabetes mellitus, type II ([place])    Glaucoma    Dr.Hecker   History of colon cancer 1998   Dr. Lennie Hummer   Hyperlipemia    Osteopenia 02/2012, 03/2014   DEXA hip -2.2   Renal insufficiency    Seizures (HCC)    Stenosing tenosynovitis of finger of left hand 2016   index - s/p steroid injection x2   Stroke [facility])     Past Surgical History:  Procedure Laterality Date   ABI  11/2013   WNL   APPENDECTOMY     BILIARY DILATION  05/31/2020   Procedure: BILIARY  DILATION;  Surgeon: Jackquline Denmark, MD;  Location: [facility] ENDOSCOPY;  Service: Endoscopy;;   BIOPSY  05/31/2020   Procedure: BIOPSY;  Surgeon: Jackquline Denmark, MD;  Location: [facility] ENDOSCOPY;  Service: Endoscopy;;   CARPAL TUNNEL RELEASE     Bilateral    CATARACT EXTRACTION Bilateral 2006   Implants   CHOLECYSTECTOMY N/A 06/01/2020   Procedure: LAPAROSCOPIC CHOLECYSTECTOMY;  Surgeon: Clovis Riley, MD;  Location: [place] OR;  Service: General;  Laterality: N/A;   [place]   For cancer   COLONOSCOPY  2008   Dr.Stark, Due 2011    COLONOSCOPY  06/2013   1 hyperplastic polyp, ileocecal anastomosis [facility])   dexa  02/2012, 03/2014   T score -2.2 at hip overall stable   ENDOSCOPIC RETROGRADE CHOLANGIOPANCREATOGRAPHY (ERCP) WITH PROPOFOL N/A 05/31/2020   Procedure: ENDOSCOPIC RETROGRADE CHOLANGIOPANCREATOGRAPHY (ERCP) WITH PROPOFOL;  Surgeon: Jackquline Denmark, MD;  Location: [place];  Service: Endoscopy;  Laterality: N/A;   REFRACTIVE SURGERY     REMOVAL OF STONES  05/31/2020   Procedure: REMOVAL OF STONES;  Surgeon: Jackquline Denmark, MD;  Location: [facility] ENDOSCOPY;  Service: Endoscopy;;   SPHINCTEROTOMY  05/31/2020   Procedure: [name];  Surgeon: Jackquline Denmark, MD;  Location: [place];  Service: Endoscopy;;   TONSILLECTOMY AND ADENOIDECTOMY     TOTAL KNEE ARTHROPLASTY Left 12/17/2016   Procedure: LEFT TOTAL KNEE ARTHROPLASTY;  Surgeon: Gaynelle Arabian, MD;  Location: [facility] ORS;  Service: Orthopedics;  Laterality: Left;     Home Medications:  Prior to Admission medications   Medication Sig Start Date End Date Taking? Authorizing Provider  acetaminophen (TYLENOL) 325 MG tablet Take 325-650 mg by mouth every 6 (six) hours as needed (for pain).    [provider]  amLODipine (NORVASC) 5 MG tablet Take 1 tablet (5 mg total) by mouth daily. 09/30/20   [name], MD  atorvastatin (LIPITOR) 40 MG tablet Take 1 tablet (40 mg total) by mouth daily at 6 PM. 06/23/20   Angiulli, Lavon Paganini, PA-C  Biotin 5000 MCG TABS Take by mouth.    [provider]  Carboxymethylcellul-Glycerin 1-0.25 % SOLN Place 1-2 drops into both eyes 3 (three) times daily as needed (for dry/irritated eyes.).    [provider]  carvedilol (COREG) 12.5 MG tablet Take 1 tablet (12.5 mg total) by mouth 2 (two) times daily. 06/23/20   Angiulli, Lavon Paganini, PA-C  Cholecalciferol (VITAMIN D3) 25 MCG (1000 UT) CAPS Take 1 capsule (1,000 Units total) by mouth daily. 06/23/20   Angiulli, Lavon Paganini, PA-C  Cinnamon 500 MG capsule Take 1,000 mg  by mouth daily.    [provider]  clopidogrel (PLAVIX) 75 MG tablet Take 1 tablet (75 mg total) by mouth daily. 07/25/20   Penumalli, Earlean Polka, MD  colchicine 0.6 MG tablet Take 1 tablet (0.6 mg total) by mouth daily as needed (gout flare). First day of gout flare, may take 1 tablet twice daily. 07/13/20   [name], MD  CONTOUR NEXT TEST test strip 1 each 4 (four) times daily. 07/12/20   [provider]  diclofenac Sodium (VOLTAREN) 1 % GEL Apply 2 g topically 4 (four) times daily. Patient taking differently: Apply 2 g topically 4 (four) times daily. As needed 06/23/20   Angiulli, Lavon Paganini, PA-C  Ferrous Sulfate (IRON) 325 (65 Fe) MG TABS Take 1 tablet (325 mg total) by mouth See admin instructions. Take 325 mg by mouth in the evening on Mondays and Thursdays only 06/23/20   Angiulli, Lavon Paganini,  PA-C  hydrALAZINE (APRESOLINE) 25 MG tablet Take 1 tablet (25 mg total) by mouth 2 (two) times daily as needed (BP > 160/100). 03/10/21   [name], MD  insulin glargine (LANTUS) 100 UNIT/ML Solostar Pen Inject 8 Units into the skin daily. 06/24/20   Angiulli, Lavon Paganini, PA-C  levETIRAcetam (KEPPRA) 750 MG tablet Take 1 tablet (750 mg total) by mouth 2 (two) times daily. 07/25/20   Penumalli, Earlean Polka, MD  losartan (COZAAR) 100 MG tablet Take 1 tablet (100 mg total) by mouth daily. 02/07/21   [name], MD  Multiple Vitamins-Minerals (PRESERVISION AREDS 2 PO) Take 1 capsule by mouth 2 (two) times daily.    [provider]  nystatin cream (MYCOSTATIN) Apply 1 application topically 2 (two) times daily. 07/29/20   [name], MD  pantoprazole (PROTONIX) 40 MG tablet Take 1 tablet by mouth twice daily 11/24/20   [name], MD    Inpatient Medications: Scheduled Meds:  apixaban  2.5 mg Oral BID   atorvastatin  40 mg Oral q1800   carvedilol  25 mg Oral BID WC   clopidogrel  75 mg Oral Daily   insulin aspart  0-5 Units Subcutaneous QHS   insulin aspart  0-9  Units Subcutaneous TID WC   levETIRAcetam  750 mg Oral BID   losartan  100 mg Oral Daily   [START ON 03/16/2021] pantoprazole  40 mg Oral BID   Continuous Infusions:   PRN Meds: acetaminophen, colchicine, ondansetron (ZOFRAN) IV  Allergies:    Allergies  Allergen Reactions   Gabapentin Other (See Comments)    Dizziness   Sertraline Other (See Comments)    Increased sedation, ?seizure activity   Pravastatin Sodium Other (See Comments)    "Anemia," per Dr Gilford Rile, [facility]    Social History:   Social History   Socioeconomic History   Marital status: Widowed    Spouse name: Not on file   Number of children: 4   Years of education: Not on file   Highest education level: High school graduate  Occupational History   Occupation: retired    Fish farm manager: Retired    Comment: Regulatory affairs officer  Tobacco Use   Smoking status: Former    Years: 2.00    Types: Cigarettes    Quit date: 06/04/1973    Years since quitting: 47.8   Smokeless tobacco: Never   Tobacco comments:    Smoked for 5 years 1965-1970 up to 1/2 pp week  Vaping Use   Vaping Use: Never used  Substance and Sexual Activity   Alcohol use: Not Currently   Drug use: No   Sexual activity: Never  Other Topics Concern   Not on file  Social History Narrative   Lives with son Ulysees Barns: retired Regulatory affairs officer   Activity: walking   Diet: good water, fruits/vegetables daily   Caffeine- some tea, occas soda   Social Determinants of Radio broadcast assistant Strain: Low Risk    Difficulty of Paying Living Expenses: Not very hard  Food Insecurity: Not on file  Transportation Needs: Not on file  Physical Activity: Not on file  Stress: Not on file  Social Connections: Not on file  Intimate Partner Violence: Not on file    Family History:    Family History  Problem Relation Age of Onset   Heart attack Father 62   Stroke Father 45   Diabetes Father    Hypertension Father    Heart disease Father  before age 31    Breast cancer Mother    Cancer Mother    Diabetes Sister    Cancer Sister    Peripheral vascular disease Sister    Breast cancer Sister    Hypertension Son    COPD Neg Hx    Asthma Neg Hx    Colon cancer Neg Hx    Esophageal cancer Neg Hx    Rectal cancer Neg Hx    Stomach cancer Neg Hx      ROS:  Constitutional: Denied fever, chills, malaise, night sweats Eyes: Denied vision change or loss Ears/Nose/Mouth/Throat: Denied ear ache, sore throat, coughing, sinus pain Cardiovascular: see HPI  Respiratory: denied shortness of breath Gastrointestinal: Denied nausea, vomiting, abdominal pain, diarrhea Genital/Urinary: Denied dysuria, hematuria, urinary frequency/urgency Musculoskeletal: mild global weakness  Skin: Denied rash, wound Neuro: hx of seizure and CVA and memory loss Psych: hx of depression Endocrine: history of diabetes     Physical Exam/Data:   Vitals:   03/15/21 1100 03/15/21 1145 03/15/21 1200 03/15/21 1300  BP: 128/67 137/68 133/66 (!) 149/61  Pulse: 65 69 67 68  Resp: 20 (!) 21 (!) 21 (!) 23  Temp:      TempSrc:      SpO2: 100% 99% 98% 100%  Weight:      Height:        Intake/Output Summary (Last 24 hours) at 03/15/2021 1315 Last data filed at 03/15/2021 1307 Gross per 24 hour  Intake 192.81 ml  Output --  Net 192.81 ml   Last 3 Weights 03/14/2021 03/10/2021 10/21/2020  Weight (lbs) 112 lb 7 oz 104 lb 4 oz 103 lb  Weight (kg) 51 kg 47.287 kg 46.72 kg     Body mass index is 21.96 kg/m.   Vitals:  Vitals:   03/15/21 1200 03/15/21 1300  BP: 133/66 (!) 149/61  Pulse: 67 68  Resp: (!) 21 (!) 23  Temp:    SpO2: 98% 100%   General Appearance: In no apparent distress, laying in bed, well nourished  HEENT: Normocephalic, atraumatic.  Neck: Supple, trachea midline, no JVDs Cardiovascular: Irregularly irregular, S1S2,   no murmur/rub/gallop Respiratory: Resting breathing unlabored, lungs sounds clear to auscultation bilaterally, no use of accessory  muscles. On room air.  No wheezes, rales or rhonchi.   Gastrointestinal: Bowel sounds positive, abdomen soft, non-tender, non-distended.  Extremities: Able to move all extremities in bed without difficulty, no edema/cyanosis/clubbing Genitourinary: genital exam not performed Musculoskeletal: Normal muscle bulk and tone Skin: Intact, warm, dry. No rashes or petechiae noted in exposed areas.  Neurologic: Alert, oriented to person, place and time. Fluent speech,  mild memory deficit, no gross focal neuro deficit Psychiatric: Normal affect. Mood is appropriate.    EKG:  The EKG was personally reviewed and demonstrates:  EKG from 03/14/21  showed A fib with RVR with ventricular rate of 109 bpm-140 bpm   Telemetry:  Telemetry was personally reviewed and demonstrates:  A fib with ventricular rate of 60-120s, occasional PVCs   Relevant CV Studies:  Echo from 03/15/21:   1. Patient in afib during study.   2. Left ventricular ejection fraction, by estimation, is 60 to 65%. The  left ventricle has normal function. The left ventricle has no regional  wall motion abnormalities. Left ventricular diastolic parameters are  indeterminate.   3. Right ventricular systolic function is normal. The right ventricular  size is normal.   4. Left atrial size was mildly dilated.   5. The mitral valve is  normal in structure. Trivial mitral valve  regurgitation. No evidence of mitral stenosis.   6. The aortic valve is tricuspid. Aortic valve regurgitation is mild.  Mild to moderate aortic valve sclerosis/calcification is present, without  any evidence of aortic stenosis.   7. The inferior vena cava is normal in size with greater than 50%  respiratory variability, suggesting right atrial pressure of 3 mmHg.    Laboratory Data:  High Sensitivity Troponin:   Recent Labs  Lab 03/14/21 2133 03/14/21 2353  TROPONINIHS 14 13     Chemistry Recent Labs  Lab 03/14/21 2133 03/14/21 2228 03/15/21 0338  NA  140 141 137  K 4.4 4.3 3.7  CL 109 111 109  CO2 21*  --  20*  GLUCOSE 197* 185* 171*  BUN 45* 43* 39*  CREATININE 1.23* 1.20* 1.13*  CALCIUM 9.1  --  8.4*  MG 2.1  --   --   GFRNONAA 43*  --  47*  ANIONGAP 10  --  8    No results for input(s): PROT, ALBUMIN, AST, ALT, ALKPHOS, BILITOT in the last 168 hours. Lipids No results for input(s): CHOL, TRIG, HDL, LABVLDL, LDLCALC, CHOLHDL in the last 168 hours.  Hematology Recent Labs  Lab 03/14/21 2133 03/14/21 2228 03/15/21 0338  WBC 9.6  --  10.2  RBC 3.84*  --  3.45*  HGB 12.2 11.2* 11.0*  HCT 36.9 33.0* 33.5*  MCV 96.1  --  97.1  MCH 31.8  --  31.9  MCHC 33.1  --  32.8  RDW 11.9  --  12.0  PLT 214  --  177   Thyroid  Recent Labs  Lab 03/14/21 2133  TSH 4.261    BNPNo results for input(s): BNP, PROBNP in the last 168 hours.  DDimer No results for input(s): DDIMER in the last 168 hours.   Radiology/Studies:  DG Chest 2 View  Result Date: 03/14/2021 CLINICAL DATA:  Tachycardia EXAM: CHEST - 2 VIEW COMPARISON:  06/13/2020 FINDINGS: The lungs are well expanded. Small right pleural effusion. The lungs are clear. No pneumothorax. No pleural effusion on the left. Cardiac size within normal limits. Pulmonary vascularity is normal. Osseous structures are age-appropriate. IMPRESSION: Small right pleural effusion. Electronically Signed   By: Fidela Salisbury M.D.   On: 03/14/2021 21:34   ECHOCARDIOGRAM COMPLETE  Result Date: 03/15/2021    ECHOCARDIOGRAM REPORT   Patient Name:   BERLINE SEMRAD Date of Exam: 03/15/2021 Medical Rec #:  [id number]      Height:       60.0 in Accession #:    [id number]     Weight:       112.4 lb Date of Birth:  1932-09-10      BSA:          1.461 m Patient Age:    81 years       BP:           159/66 mmHg Patient Gender: F              HR:           87 bpm. Exam Location:  Inpatient Procedure: 2D Echo, Cardiac Doppler and Color Doppler Indications:    I48.92* Unspecified atrial flutter  History:         Patient has prior history of Echocardiogram examinations, most                 recent 06/03/2020. Stroke; Risk Factors:Diabetes and  Dyslipidemia.  Sonographer:    Bernadene Person RDCS Referring Phys: [id number] [place]  1. Patient in afib during study.  2. Left ventricular ejection fraction, by estimation, is 60 to 65%. The left ventricle has normal function. The left ventricle has no regional wall motion abnormalities. Left ventricular diastolic parameters are indeterminate.  3. Right ventricular systolic function is normal. The right ventricular size is normal.  4. Left atrial size was mildly dilated.  5. The mitral valve is normal in structure. Trivial mitral valve regurgitation. No evidence of mitral stenosis.  6. The aortic valve is tricuspid. Aortic valve regurgitation is mild. Mild to moderate aortic valve sclerosis/calcification is present, without any evidence of aortic stenosis.  7. The inferior vena cava is normal in size with greater than 50% respiratory variability, suggesting right atrial pressure of 3 mmHg. FINDINGS  Left Ventricle: Left ventricular ejection fraction, by estimation, is 60 to 65%. The left ventricle has normal function. The left ventricle has no regional wall motion abnormalities. The left ventricular internal cavity size was normal in size. There is  no left ventricular hypertrophy. Left ventricular diastolic parameters are indeterminate. Right Ventricle: The right ventricular size is normal. No increase in right ventricular wall thickness. Right ventricular systolic function is normal. Left Atrium: Left atrial size was mildly dilated. Right Atrium: Right atrial size was normal in size. Pericardium: There is no evidence of pericardial effusion. Mitral Valve: The mitral valve is normal in structure. There is mild thickening of the mitral valve leaflet(s). There is mild calcification of the mitral valve leaflet(s). Mild mitral annular calcification.  Trivial mitral valve regurgitation. No evidence  of mitral valve stenosis. Tricuspid Valve: The tricuspid valve is normal in structure. Tricuspid valve regurgitation is not demonstrated. No evidence of tricuspid stenosis. Aortic Valve: The aortic valve is tricuspid. Aortic valve regurgitation is mild. Aortic regurgitation PHT measures 459 msec. Mild to moderate aortic valve sclerosis/calcification is present, without any evidence of aortic stenosis. Pulmonic Valve: The pulmonic valve was normal in structure. Pulmonic valve regurgitation is not visualized. No evidence of pulmonic stenosis. Aorta: The aortic root is normal in size and structure. Venous: The inferior vena cava is normal in size with greater than 50% respiratory variability, suggesting right atrial pressure of 3 mmHg. IAS/Shunts: No atrial level shunt detected by color flow Doppler. Additional Comments: Patient in afib during study.  LEFT VENTRICLE PLAX 2D LVIDd:         2.30 cm LVIDs:         1.70 cm LV PW:         1.10 cm LV IVS:        1.10 cm LVOT diam:     1.90 cm LV SV:         51 LV SV Index:   35 LVOT Area:     2.84 cm  RIGHT VENTRICLE TAPSE (M-mode): 1.0 cm LEFT ATRIUM             Index        RIGHT ATRIUM          Index LA diam:        3.10 cm 2.12 cm/m   RA Area:     9.07 cm LA Vol (A2C):   29.9 ml 20.46 ml/m  RA Volume:   15.60 ml 10.68 ml/m LA Vol (A4C):   38.4 ml 26.28 ml/m LA Biplane Vol: 33.9 ml 23.20 ml/m  AORTIC VALVE LVOT Vmax:   87.60 cm/s LVOT Vmean:  59.250 cm/s LVOT VTI:    0.181 m AI PHT:      459 msec  AORTA Ao Root diam: 2.50 cm Ao Asc diam:  2.50 cm  SHUNTS Systemic VTI:  0.18 m Systemic Diam: 1.90 cm Jenkins Rouge MD Electronically signed by Jenkins Rouge MD Signature Date/Time: 03/15/2021/10:19:26 AM    Final      Assessment and Plan:   Paroxysmal A fib with RVR, asymptomatic  - hx of remote transient A fib in 2020 hospitalization for seizure, resolved spontaneously, not on anticoagulation due to fall risk  -  presented with HR 130s since 03/14/21 AM, no symptoms  - HR control is achieved from cardizem gtt at this time  - K 3.7, add Mag, TSH WNL - Echo with EF preserved, no significant valvular disease  - would aim rate control, increase home Coreg to 25mg  BID, if rate inadequate controlled may add cardizem CD 120mg  daily  - GI workup: 2021 ERCP showed mild gastritis with no varices, portal gastropathy, ulcers Colonoscopy 2015 with HP polyp removed and healthy-appearing ileocolonic anastomosis, nonbleeding internal hemorrhoids - CHA2DS2VASc score is 7, family able provide 24/7 care no longer falls frequently, no hx of major bleeding, favor anticoagulation with PO Eliquis 2.5 mg BID (age and weight), daughter is agreeable ; would monitor for bleeding/worsening anemia closely, discussed bleeding precaution   Hx of ischemic CVA  - on plavix 75mg  daily per neuro due to significant intracranial stenosis and hx of CVA, would have elevated risk of bleeding when adding Elqiuis, may return to neurology for further recommendation on whether continuing Plavix long term   HTN - BP is elevated, increased Coreg as above, continue home med losartan, amlodipine  HLD Type 2 DM Gastritis  Gout  Anemia  Seizure CKD III - managed per IM     Risk Assessment/Risk Scores:      CHA2DS2-VASc Score = 7  This indicates a 11.2% annual risk of stroke. The patient's score is based upon: CHF History: 0 HTN History: 1 Diabetes History: 1 Stroke History: 2 Vascular Disease History: 0 Age Score: 2 Gender Score: 1    For questions or updates, please contact [place] Please consult [email or web address] for contact info under    Signed, Margie Billet, NP  03/15/2021 1:15 PM   I have seen and examined the patient along with Margie Billet, NP .  I have reviewed the chart, notes and new data.  I agree with PA/NP's note.  Key new complaints: asymptomatic from CV standpoint Key examination changes: irregular rhythm, now  rate controlled 80s; otw normal CV exam Key new findings / data: all ECG/telemetry tracings c/w AFib, not flutter. Echo essentially normal except mildly dilated left atrium  PLAN:  Asymptomatic paroxysmal atrial fibrillation, at least the 3rd documented episode. High embolic stroke risk (CHADSVasc 7). Also at risk for atheroma-related stroke w severe intracranial arterial disease per MRA. She has not had falls in a very long time and is carefully monitored by family 24/7. Anemia resolved. Increase carvedilol and add Eliquis (dose adjusted for small body size and age). Periodically re-assess the risk/benefit balance for anticoagulation.   [facility] will sign off.   Medication Recommendations:  Add Eliquis 2.5 mg BID, increase carvedilol to 25 mg BID. Continue  clopidogrel and her other usual antihypertensives Other recommendations (labs, testing, etc):  suggested purchasing an electrical rhythm monitor (e.g. Kardia mobile) Follow up as an outpatient:  03/31/2021 scheduled   Sanda Klein, MD, [place] [phone] 03/15/2021,  1:25 PM

## 2021-03-15 NOTE — Discharge Instructions (Signed)

## 2021-03-15 NOTE — Discharge Planning (Signed)
RNCM consulted regarding pt needing Eliquis education and discount card.  RNCM confirmed with ED Pharmacist that they have discount cards available.

## 2021-03-16 ENCOUNTER — Encounter: Payer: Self-pay | Admitting: Family Medicine

## 2021-03-16 NOTE — Telephone Encounter (Signed)
Spoke with pt's son, [name] (on dpr), scheduling pt's hosp f/u on 03/11/21 at 4:00.

## 2021-03-16 NOTE — Telephone Encounter (Signed)
Please call for hospital follow up phone call and schedule hosp fu visit within 2 weeks.

## 2021-03-17 ENCOUNTER — Encounter: Payer: Self-pay | Admitting: Internal Medicine

## 2021-03-17 ENCOUNTER — Observation Stay
Admission: EM | Admit: 2021-03-17 | Discharge: 2021-03-18 | Disposition: A | Discharge status: Home / Self Care | Payer: Medicare Other | Attending: Internal Medicine | Admitting: Internal Medicine

## 2021-03-17 ENCOUNTER — Emergency Department: Payer: Medicare Other

## 2021-03-17 DIAGNOSIS — Z79899 Other long term (current) drug therapy: Secondary | ICD-10-CM | POA: Insufficient documentation

## 2021-03-17 DIAGNOSIS — N183 Chronic kidney disease, stage 3 unspecified: Secondary | ICD-10-CM | POA: Insufficient documentation

## 2021-03-17 DIAGNOSIS — Z794 Long term (current) use of insulin: Secondary | ICD-10-CM | POA: Diagnosis not present

## 2021-03-17 DIAGNOSIS — E1122 Type 2 diabetes mellitus with diabetic chronic kidney disease: Secondary | ICD-10-CM | POA: Diagnosis not present

## 2021-03-17 DIAGNOSIS — R569 Unspecified convulsions: Secondary | ICD-10-CM

## 2021-03-17 DIAGNOSIS — Z20822 Contact with and (suspected) exposure to covid-19: Secondary | ICD-10-CM | POA: Insufficient documentation

## 2021-03-17 DIAGNOSIS — I10 Essential (primary) hypertension: Secondary | ICD-10-CM | POA: Insufficient documentation

## 2021-03-17 DIAGNOSIS — E118 Type 2 diabetes mellitus with unspecified complications: Secondary | ICD-10-CM | POA: Diagnosis present

## 2021-03-17 DIAGNOSIS — J90 Pleural effusion, not elsewhere classified: Secondary | ICD-10-CM | POA: Diagnosis not present

## 2021-03-17 DIAGNOSIS — I4891 Unspecified atrial fibrillation: Secondary | ICD-10-CM | POA: Diagnosis not present

## 2021-03-17 DIAGNOSIS — J9811 Atelectasis: Secondary | ICD-10-CM | POA: Diagnosis not present

## 2021-03-17 DIAGNOSIS — F03B0 Unspecified dementia, moderate, without behavioral disturbance, psychotic disturbance, mood disturbance, and anxiety: Secondary | ICD-10-CM | POA: Diagnosis not present

## 2021-03-17 DIAGNOSIS — R0602 Shortness of breath: Secondary | ICD-10-CM | POA: Diagnosis not present

## 2021-03-17 DIAGNOSIS — I499 Cardiac arrhythmia, unspecified: Secondary | ICD-10-CM | POA: Diagnosis not present

## 2021-03-17 DIAGNOSIS — R404 Transient alteration of awareness: Secondary | ICD-10-CM | POA: Diagnosis not present

## 2021-03-17 DIAGNOSIS — Z66 Do not resuscitate: Secondary | ICD-10-CM | POA: Diagnosis present

## 2021-03-17 DIAGNOSIS — Z8673 Personal history of transient ischemic attack (TIA), and cerebral infarction without residual deficits: Secondary | ICD-10-CM

## 2021-03-17 DIAGNOSIS — R000 Tachycardia, unspecified: Secondary | ICD-10-CM | POA: Diagnosis not present

## 2021-03-17 DIAGNOSIS — R531 Weakness: Secondary | ICD-10-CM | POA: Diagnosis not present

## 2021-03-17 DIAGNOSIS — Z87891 Personal history of nicotine dependence: Secondary | ICD-10-CM | POA: Insufficient documentation

## 2021-03-17 DIAGNOSIS — I679 Cerebrovascular disease, unspecified: Secondary | ICD-10-CM

## 2021-03-17 LAB — BASIC METABOLIC PANEL
Anion gap: 9 (ref 5–15)
BUN: 39 mg/dL — ABNORMAL HIGH (ref 8–23)
CO2: 22 mmol/L (ref 22–32)
Calcium: 9 mg/dL (ref 8.9–10.3)
Chloride: 109 mmol/L (ref 98–111)
Creatinine, Ser: 1.31 mg/dL — ABNORMAL HIGH (ref 0.44–1.00)
GFR, Estimated: 40 mL/min — ABNORMAL LOW (ref >60)
Glucose, Bld: 147 mg/dL — ABNORMAL HIGH (ref 70–99)
Potassium: 4.4 mmol/L (ref 3.5–5.1)
Sodium: 140 mmol/L (ref 135–145)

## 2021-03-17 LAB — URINALYSIS, COMPLETE (UACMP) WITH MICROSCOPIC
Bilirubin Urine: NEGATIVE
Glucose, UA: NEGATIVE mg/dL
Ketones, ur: NEGATIVE mg/dL
Nitrite: NEGATIVE
Protein, ur: 30 mg/dL — AB
RBC / HPF: >50 RBC/hpf — ABNORMAL HIGH (ref 0–5)
Specific Gravity, Urine: 1.016 (ref 1.005–1.030)
Squamous Epithelial / HPF: NONE SEEN (ref 0–5)
pH: 5 (ref 5.0–8.0)

## 2021-03-17 LAB — CBG MONITORING, ED
Glucose-Capillary: 132 mg/dL — ABNORMAL HIGH (ref 70–99)
Glucose-Capillary: 120 mg/dL — ABNORMAL HIGH (ref 70–99)
Glucose-Capillary: 135 mg/dL — ABNORMAL HIGH (ref 70–99)

## 2021-03-17 LAB — DIGOXIN LEVEL: Digoxin Level: <0.2 ng/mL — ABNORMAL LOW (ref 0.8–2.0)

## 2021-03-17 LAB — CBC
HCT: 33.1 % — ABNORMAL LOW (ref 36.0–46.0)
Hemoglobin: 11.4 g/dL — ABNORMAL LOW (ref 12.0–15.0)
MCH: 32.9 pg (ref 26.0–34.0)
MCHC: 34.4 g/dL (ref 30.0–36.0)
MCV: 95.7 fL (ref 80.0–100.0)
Platelets: 168 10*3/uL (ref 150–400)
RBC: 3.46 MIL/uL — ABNORMAL LOW (ref 3.87–5.11)
RDW: 12.2 % (ref 11.5–15.5)
WBC: 9.8 10*3/uL (ref 4.0–10.5)
nRBC: 0 % (ref 0.0–0.2)

## 2021-03-17 LAB — MAGNESIUM: Magnesium: 2.1 mg/dL (ref 1.7–2.4)

## 2021-03-17 LAB — RESP PANEL BY RT-PCR (FLU A&B, COVID) ARPGX2
Influenza A by PCR: NEGATIVE
Influenza B by PCR: NEGATIVE
SARS Coronavirus 2 by RT PCR: NEGATIVE

## 2021-03-17 LAB — LACTIC ACID, PLASMA: Lactic Acid, Venous: 1.5 mmol/L (ref 0.5–1.9)

## 2021-03-17 LAB — TROPONIN I (HIGH SENSITIVITY)
Troponin I (High Sensitivity): 16 ng/L (ref <18)
Troponin I (High Sensitivity): 10 ng/L (ref <18)

## 2021-03-17 MED ORDER — ACETAMINOPHEN 325 MG RE SUPP: 650.0000 mg | Freq: Four times a day (QID) | RECTAL | Status: DC | PRN

## 2021-03-17 MED ORDER — PANTOPRAZOLE SODIUM 40 MG PO TBEC
40.0000 mg | DELAYED_RELEASE_TABLET | Freq: Every day | ORAL | Status: DC
  Administered 2021-03-17 – 2021-03-18 (×2): 40 mg via ORAL
  Filled 2021-03-17: qty 1

## 2021-03-17 MED ORDER — LEVETIRACETAM 750 MG PO TABS
750.0000 mg | ORAL_TABLET | Freq: Two times a day (BID) | ORAL | Status: DC
  Administered 2021-03-17 – 2021-03-18 (×2): 750 mg via ORAL
  Filled 2021-03-17 (×3): qty 1

## 2021-03-17 MED ORDER — ONDANSETRON HCL 4 MG PO TABS: 4.0000 mg | ORAL_TABLET | Freq: Four times a day (QID) | ORAL | Status: DC | PRN

## 2021-03-17 MED ORDER — DILTIAZEM HCL ER COATED BEADS 120 MG PO CP24
120.0000 mg | ORAL_CAPSULE | Freq: Every day | ORAL | Status: DC
  Administered 2021-03-17 – 2021-03-18 (×2): 120 mg via ORAL
  Filled 2021-03-17 (×3): qty 1

## 2021-03-17 MED ORDER — INSULIN GLARGINE-YFGN 100 UNIT/ML ~~LOC~~ SOLN
8.0000 [IU] | Freq: Every day | SUBCUTANEOUS | Status: DC
  Filled 2021-03-17 (×2): qty 0.08

## 2021-03-17 MED ORDER — HYDRALAZINE HCL 20 MG/ML IJ SOLN: 5.0000 mg | INTRAMUSCULAR | Status: DC | PRN

## 2021-03-17 MED ORDER — DILTIAZEM HCL ER COATED BEADS 120 MG PO CP24: 120.0000 mg | ORAL_CAPSULE | Freq: Every day | ORAL | Status: DC

## 2021-03-17 MED ORDER — DIGOXIN 125 MCG PO TABS
0.1250 mg | ORAL_TABLET | Freq: Every day | ORAL | Status: DC
  Administered 2021-03-18: 0.125 mg via ORAL
  Filled 2021-03-17: qty 1

## 2021-03-17 MED ORDER — CARVEDILOL 25 MG PO TABS
25.0000 mg | ORAL_TABLET | Freq: Two times a day (BID) | ORAL | Status: DC
  Administered 2021-03-17 – 2021-03-18 (×2): 25 mg via ORAL
  Filled 2021-03-17: qty 1

## 2021-03-17 MED ORDER — ONDANSETRON HCL 4 MG/2ML IJ SOLN: 4.0000 mg | Freq: Four times a day (QID) | INTRAMUSCULAR | Status: DC | PRN

## 2021-03-17 MED ORDER — DIGOXIN 0.25 MG/ML IJ SOLN
0.2500 mg | Freq: Once | INTRAMUSCULAR | Status: AC
  Administered 2021-03-17: 0.25 mg via INTRAVENOUS
  Filled 2021-03-17: qty 2

## 2021-03-17 MED ORDER — ATORVASTATIN CALCIUM 20 MG PO TABS
40.0000 mg | ORAL_TABLET | Freq: Every evening | ORAL | Status: DC
  Administered 2021-03-17: 40 mg via ORAL

## 2021-03-17 MED ORDER — INSULIN ASPART 100 UNIT/ML IJ SOLN
0.0000 [IU] | Freq: Three times a day (TID) | INTRAMUSCULAR | Status: DC
  Administered 2021-03-18: 2 [IU] via SUBCUTANEOUS
  Administered 2021-03-18: 3 [IU] via SUBCUTANEOUS
  Filled 2021-03-17 (×2): qty 1

## 2021-03-17 MED ORDER — CLOPIDOGREL BISULFATE 75 MG PO TABS
75.0000 mg | ORAL_TABLET | Freq: Every evening | ORAL | Status: DC
  Administered 2021-03-17: 75 mg via ORAL
  Filled 2021-03-17: qty 1

## 2021-03-17 MED ORDER — ACETAMINOPHEN 325 MG PO TABS: 650.0000 mg | ORAL_TABLET | Freq: Four times a day (QID) | ORAL | Status: DC | PRN

## 2021-03-17 MED ORDER — APIXABAN 2.5 MG PO TABS
2.5000 mg | ORAL_TABLET | Freq: Two times a day (BID) | ORAL | Status: DC
  Administered 2021-03-17 – 2021-03-18 (×2): 2.5 mg via ORAL
  Filled 2021-03-17 (×3): qty 1

## 2021-03-17 MED ORDER — INSULIN GLARGINE 100 UNIT/ML SOLOSTAR PEN: 8.0000 [IU] | PEN_INJECTOR | Freq: Every day | SUBCUTANEOUS | Status: DC

## 2021-03-17 NOTE — Assessment & Plan Note (Signed)
-  Stage 3b CKD -Appears to be at/near baseline -Will follow

## 2021-03-17 NOTE — Assessment & Plan Note (Signed)
-  Stop Norvasc and start Cardizem -Stop Losartan -Continue increased dose of Coreg -Will add prn IV hydralazine

## 2021-03-17 NOTE — ED Triage Notes (Signed)
Altered, afib, slow to respond, hypotensive

## 2021-03-17 NOTE — Telephone Encounter (Signed)
Patient's hospital follow-up is 03/21/21 at 4:00 pm.

## 2021-03-17 NOTE — Discharge Instructions (Addendum)
STOP use of amlodipine and losartan.

## 2021-03-17 NOTE — Assessment & Plan Note (Signed)
-  Prior A1c 7.7, which is reasonable at her age -Continue glargine -Cover with moderate-scale SSI

## 2021-03-17 NOTE — H&P (Signed)
History and Physical    Taylor Castaneda:[id number] DOB: 20-Jun-1932 DOA: 03/17/2021  PCP: [name], MD Consultants:  Chalmers Cater - endocrinology; Robson - wound; Gwenlyn Found - cardiology Patient coming from:  Home - lives with children, on a rotating basis; NOK: Ericka Pontiff, [phone]  Chief Complaint: Fatigue  HPI: Taylor Castaneda is a 87 y.o. female with medical history significant of CVA; DM; h/o colon CA; seizure d/o; and HLD presenting with fatigue.  She was better but still sleeping a lot.  Today, she was too sleepy to even take her meds.  Her BP was low and HR was high and so they came back.  She feels fine now.  She took all of her medications, including this AM.  She was previously admitted from 10/11-12 with new onset afib (had transient afib in 2020 around the time of a seizure and so was not started on [facility]).  She was placed on Cardizem drip and this was transitioned to increased dose of Coreg at the time of dc.  She was also started on Eliquis.      ED Course: Afib with RVR.  Just discharged on 10/12 for new-onset afib with RVR, started on Coreg and Eliquis.  Felt very weak, tired, BP in 70s, HR in 150-160 range.  Given 500 cc IVF, BP improved to 100s, HR 120.  Dr. Rockey Situ saw in ER, given Dilt with ongoing rate to 120-130s.  He recommends stopping amlodipine and Losartan and he wants to start Digoxin.  Review of Systems: As per HPI; otherwise review of systems reviewed and negative.   Ambulatory Status:  Ambulates with a walker  COVID Vaccine Status:  Complete plus booster  Past Medical History:  Diagnosis Date   Anemia    Cerebral infarction ([place]) 05/2020   Diabetes mellitus, type II ([place])    Glaucoma    Dr.Hecker   History of colon cancer 1998   Dr. Lennie Hummer   Hyperlipemia    Osteopenia 02/2012, 03/2014   DEXA hip -2.2   Renal insufficiency    Seizures (HCC)    Stenosing tenosynovitis of finger of left hand 2016   index - s/p steroid injection x2    Past  Surgical History:  Procedure Laterality Date   ABI  11/2013   WNL   APPENDECTOMY     BILIARY DILATION  05/31/2020   Procedure: BILIARY DILATION;  Surgeon: Jackquline Denmark, MD;  Location: [facility] ENDOSCOPY;  Service: Endoscopy;;   BIOPSY  05/31/2020   Procedure: BIOPSY;  Surgeon: Jackquline Denmark, MD;  Location[facility] ENDOSCOPY;  Service: Endoscopy;;   CARPAL TUNNEL RELEASE     Bilateral    CATARACT EXTRACTION Bilateral 2006   Implants   CHOLECYSTECTOMY N/A 06/01/2020   Procedure: LAPAROSCOPIC CHOLECYSTECTOMY;  Surgeon: Clovis Riley, MD;  Location: [place] OR;  Service: General;  Laterality: N/A;   [place]   For cancer   COLONOSCOPY  2008   Dr.Stark, Due 2011   COLONOSCOPY  06/2013   1 hyperplastic polyp, ileocecal anastomosis [facility])   dexa  02/2012, 03/2014   T score -2.2 at hip overall stable   ENDOSCOPIC RETROGRADE CHOLANGIOPANCREATOGRAPHY (ERCP) WITH PROPOFOL N/A 05/31/2020   Procedure: ENDOSCOPIC RETROGRADE CHOLANGIOPANCREATOGRAPHY (ERCP) WITH PROPOFOL;  Surgeon: Jackquline Denmark, MD;  Location: [place];  Service: Endoscopy;  Laterality: N/A;   REFRACTIVE SURGERY     REMOVAL OF STONES  05/31/2020   Procedure: REMOVAL OF STONES;  Surgeon: Jackquline Denmark, MD;  Location: [place];  Service:  Endoscopy;;   SPHINCTEROTOMY  05/31/2020   Procedure: SPHINCTEROTOMY;  Surgeon: Jackquline Denmark, MD;  Location: [facility] ENDOSCOPY;  Service: Endoscopy;;   TONSILLECTOMY AND ADENOIDECTOMY     TOTAL KNEE ARTHROPLASTY Left 12/17/2016   Procedure: LEFT TOTAL KNEE ARTHROPLASTY;  Surgeon: Gaynelle Arabian, MD;  Location: [facility] ORS;  Service: Orthopedics;  Laterality: Left;    Social History   Socioeconomic History   Marital status: Widowed    Spouse name: Not on file   Number of children: 4   Years of education: Not on file   Highest education level: High school graduate  Occupational History   Occupation: retired    Fish farm manager: Retired    Comment: Regulatory affairs officer  Tobacco Use   Smoking status: Former     Years: 2.00    Types: Cigarettes    Quit date: 06/04/1973    Years since quitting: 47.8   Smokeless tobacco: Never   Tobacco comments:    Smoked for 5 years 1965-1970 up to 1/2 pp week  Vaping Use   Vaping Use: Never used  Substance and Sexual Activity   Alcohol use: Not Currently   Drug use: Never   Sexual activity: Never  Other Topics Concern   Not on file  Social History Narrative   Lives with son Ulysees Barns: retired Regulatory affairs officer   Activity: walking   Diet: good water, fruits/vegetables daily   Caffeine- some tea, occas soda   Social Determinants of Radio broadcast assistant Strain: Low Risk    Difficulty of Paying Living Expenses: Not very hard  Food Insecurity: Not on file  Transportation Needs: Not on file  Physical Activity: Not on file  Stress: Not on file  Social Connections: Not on file  Intimate Partner Violence: Not on file    Allergies  Allergen Reactions   Gabapentin Other (See Comments)    Dizziness   Sertraline Other (See Comments)    Increased sedation, ?seizure activity   Pravastatin Sodium Other (See Comments)    "Anemia," per Dr Gilford Rile, [place]    Family History  Problem Relation Age of Onset   Heart attack Father 34   Stroke Father 63   Diabetes Father    Hypertension Father    Heart disease Father        before age 61   Breast cancer Mother    Cancer Mother    Diabetes Sister    Cancer Sister    Peripheral vascular disease Sister    Breast cancer Sister    Hypertension Son    COPD Neg Hx    Asthma Neg Hx    Colon cancer Neg Hx    Esophageal cancer Neg Hx    Rectal cancer Neg Hx    Stomach cancer Neg Hx     Prior to Admission medications   Medication Sig Start Date End Date Taking? Authorizing Provider  acetaminophen (TYLENOL) 325 MG tablet Take 325-650 mg by mouth every 6 (six) hours as needed (for pain).   Yes [provider]  apixaban (ELIQUIS) 2.5 MG TABS tablet Take 1 tablet (2.5 mg total) by mouth 2 (two) times  daily. 03/15/21  Yes Florencia Reasons, MD  atorvastatin (LIPITOR) 40 MG tablet Take 1 tablet (40 mg total) by mouth daily at 6 PM. 06/23/20  Yes Angiulli, Lavon Paganini, PA-C  Biotin 5000 MCG TABS Take by mouth.   Yes [provider]  Carboxymethylcellul-Glycerin 1-0.25 % SOLN Place 1-2 drops into both eyes 3 (three) times daily as  needed (for dry/irritated eyes.).   Yes [provider]  carvedilol (COREG) 25 MG tablet Take 1 tablet (25 mg total) by mouth 2 (two) times daily with a meal. 03/16/21  Yes Florencia Reasons, MD  Cholecalciferol (VITAMIN D3) 25 MCG (1000 UT) CAPS Take 1 capsule (1,000 Units total) by mouth daily. 06/23/20  Yes Angiulli, Lavon Paganini, PA-C  Cinnamon 500 MG capsule Take 1,000 mg by mouth daily.   Yes [provider]  clopidogrel (PLAVIX) 75 MG tablet Take 1 tablet (75 mg total) by mouth daily. 07/25/20  Yes Penumalli, Earlean Polka, MD  colchicine 0.6 MG tablet Take 1 tablet (0.6 mg total) by mouth daily as needed (gout flare). First day of gout flare, may take 1 tablet twice daily. 07/13/20  Yes [name], MD  diclofenac Sodium (VOLTAREN) 1 % GEL Apply 2 g topically 4 (four) times daily. Patient taking differently: Apply 2 g topically 4 (four) times daily. As needed 06/23/20  Yes Angiulli, Lavon Paganini, PA-C  Ferrous Sulfate (IRON) 325 (65 Fe) MG TABS Take 1 tablet (325 mg total) by mouth See admin instructions. Take 325 mg by mouth in the evening on Mondays and Thursdays only 06/23/20  Yes Angiulli, Lavon Paganini, PA-C  hydrALAZINE (APRESOLINE) 25 MG tablet Take 1 tablet (25 mg total) by mouth 2 (two) times daily as needed (BP > 160/100). 03/10/21  Yes [name], MD  insulin glargine (LANTUS) 100 UNIT/ML Solostar Pen Inject 8 Units into the skin daily. 06/24/20  Yes Angiulli, Lavon Paganini, PA-C  levETIRAcetam (KEPPRA) 750 MG tablet Take 1 tablet (750 mg total) by mouth 2 (two) times daily. 07/25/20  Yes Penumalli, Earlean Polka, MD  Multiple Vitamins-Minerals (PRESERVISION AREDS 2 PO) Take  1 capsule by mouth 2 (two) times daily.   Yes [provider]  pantoprazole (PROTONIX) 40 MG tablet Take 1 tablet by mouth twice daily Patient taking differently: Take 40 mg by mouth daily. 11/24/20  Yes [name], MD  CONTOUR NEXT TEST test strip 1 each 4 (four) times daily. 07/12/20   [provider]  nystatin cream (MYCOSTATIN) Apply 1 application topically 2 (two) times daily. 07/29/20   [name], MD    Physical Exam: Vitals:   03/17/21 1130 03/17/21 1255 03/17/21 1432 03/17/21 1600  BP: 129/73 135/86 140/77   Pulse:  60 65 99  Resp:  (!) 21 17   Temp:  98.1 F (36.7 C) 98.8 F (37.1 C)   TempSrc:  Oral Oral   SpO2: 99% 100% 98%      General:  Appears calm and comfortable and is in NAD Eyes:  PERRL, EOMI, normal lids, iris ENT:  grossly normal hearing, lips & tongue, mmm; artificial dentition Neck:  no LAD, masses or thyromegaly Cardiovascular:  Irregularly irregular with tachycardia, no m/r/g. No LE edema.  Respiratory:   CTA bilaterally with no wheezes/rales/rhonchi.  Normal respiratory effort. Abdomen:  soft, NT, ND Skin:  no rash or induration seen on limited exam Musculoskeletal:  grossly normal tone BUE/BLE, good ROM, no bony abnormality Psychiatric:  grossly normal mood and affect, speech fluent and appropriate, AOx3 Neurologic:  CN 2-12 grossly intact, moves all extremities in coordinated fashion    Radiological Exams on Admission: Independently reviewed - see discussion in A/P where applicable  DG Chest Portable 1 View  Result Date: 03/17/2021 CLINICAL DATA:  Weakness.  Atrial fibrillation. EXAM: PORTABLE CHEST 1 VIEW COMPARISON:  03/14/2021 FINDINGS: 11 46 hours. Low volume film. The cardio pericardial silhouette is enlarged.  Interstitial markings are diffusely coarsened with chronic features. Mild vascular congestion with basilar atelectasis and small right pleural effusion. Telemetry leads overlie the chest. IMPRESSION: Low  volume film with vascular congestion and small right pleural effusion. Electronically Signed   By: Misty Stanley M.D.   On: 03/17/2021 12:14    EKG: Independently reviewed.  Afib with rate 119; nonspecific ST changes with no evidence of acute ischemia   Labs on Admission: I have personally reviewed the available labs and imaging studies at the time of the admission.  Pertinent labs:   Glucose 147 BUN 39/Creatinine 1.31/GFR 40 HS troponin 16, 10 Lactate 1.5 WBC 9.8 Hgb 11.4 Digoxin 0.2 UA: large Hgb, trace LE, 30 protein, rare bacteria, >50 RBC   Assessment/Plan Principal Problem:   Atrial fibrillation with RVR (HCC) Active Problems:   Controlled diabetes mellitus type 2 with complications (HCC)   Essential hypertension   CKD (chronic kidney disease) stage 3, GFR 30-59 ml/min (HCC)   Seizure (HCC)   DNR (do not resuscitate)   * Atrial fibrillation with RVR ([place]) -Patient presenting with recurrent afib with RVR. -She was hospitalized for this from 10/11-12 and discharged 2 days ago. -Since then, she has been increasingly somnolent and with EMS was found to be in RVR again with hypotension (resolved).  -Will admit to progressive care unit; patient needs admission based on failed outpatient therapy. -HS troponin negative x 2. -Will consult cardiology; they have seen the patient and recommend initiation of Digoxin and Cardizem -Continue Coreg -Continue home Eliquis (started during last hospitalization).  DNR (do not resuscitate) -I have discussed code status with the patient and her son and  they are in agreement that the patient would not desire resuscitation and would prefer to die a natural death should that situation arise. -She has a gold out of facility DNR form at the bedside  Seizure ([place]) -Continue Keppra -?seizure prior to last admission, otherwise none recently  CKD (chronic kidney disease) stage 3, GFR 30-59 ml/min (HCC) -Stage 3b CKD -Appears to be at/near  baseline -Will follow  Essential hypertension -Stop Norvasc and start Cardizem -Stop Losartan -Continue increased dose of Coreg -Will add prn IV hydralazine  Controlled diabetes mellitus type 2 with complications (HCC) -Prior A1c 7.7, which is reasonable at her age -Continue glargine -Cover with moderate-scale SSI     Note: This patient has been tested and is pending for the novel coronavirus COVID-19. The patient has been fully vaccinated against COVID-19.   Level of care: Progressive Cardiac DVT prophylaxis: Eliquis Code Status:  DNR - confirmed with patient/family Family Communication: Son was present throughout evaluation Disposition Plan:  The patient is from: home  Anticipated d/c is to: home without [facility] services   Anticipated d/c date will depend on clinical response to treatment, likely 2-3 days  Patient is currently: acutely ill Consults called: Cardiology  Admission status:  Admit - It is my clinical opinion that admission to INPATIENT is reasonable and necessary because of the expectation that this patient will require hospital care that crosses at least 2 midnights to treat this condition based on the medical complexity of the problems presented.  Given the aforementioned information, the predictability of an adverse outcome is felt to be significant.    Karmen Bongo MD [facility]   How to contact the [facility] Attending or Consulting provider [place] or covering provider during after hours [place], for this patient?  Check the care team in [facility] and look for a)  attending/consulting [facility] provider listed and b) the [facility] team listed Log into [email or web address] and use [place]'s universal password to access. If you do not have the password, please contact the hospital operator. Locate the [facility] provider you are looking for under [facility] and page to a number that you can be directly reached. If you still have difficulty reaching the provider, please page the [facility]  (Director on Call) for the Hospitalists listed on amion for assistance.   03/17/2021, 4:54 PM

## 2021-03-17 NOTE — ED Provider Notes (Signed)
[facility] Emergency Department Provider Note   ____________________________________________   None    (approximate)  I have reviewed the triage vital signs and the nursing notes.   HISTORY  Chief Complaint Tachycardia (Afib, altered )  EM caveat: Somewhat poor historian  HPI Taylor Castaneda is a 87 y.o. female history of renal insufficiency previous stroke, new atrial fibrillation on Eliquis.  Patient presents for evaluation after being found fatigued last night but also severely fatigued this morning.  EMS arrived and found the patient was hypotensive with irregular tachycardia and A. fib with a heart rate variable from approximately 1 60-1 10 range.  Lowest blood pressure in the 92J systolic region.  Patient's blood pressure did respond well to 2 250 mL normal saline boluses with EMS.  Blood pressure slightly over 194 systolic at time of ED arrival.  Patient has been somnolent but does alert to voice.  Patient herself reports that she feels tired.  Reports has been feeling tired today and yesterday.  Denies any chest pain no trouble breathing.  Does not know if she took her medication yet today, but recalls being recently in the hospital.  Patient reports overall she just feels very weak, denies being weak in any 1 particular spot.  Past Medical History:  Diagnosis Date   Anemia    Cerebral infarction ([place]) 05/2020   Diabetes mellitus, type II ([place])    Glaucoma    Dr.Hecker   History of colon cancer 1998   Dr. Lennie Hummer   Hyperlipemia    Osteopenia 02/2012, 03/2014   DEXA hip -2.2   Renal insufficiency    Seizures ([place])    Stenosing tenosynovitis of finger of left hand 2016   index - s/p steroid injection x2    Patient Active Problem List   Diagnosis Date Noted   Atrial fibrillation with RVR ([place]) 03/17/2021   Atrial flutter with rapid ventricular response ([place]) 03/15/2021   DNR (do not resuscitate) 03/11/2021   Skin lesion of right leg  17/40/8144   Systolic murmur 81/85/6314   Hereditary and idiopathic neuropathy, unspecified 08/15/2020   Long term (current) use of insulin ([place]) 08/15/2020   Proteinuria 08/15/2020   Intertrigo 07/29/2020   GI bleed    Pressure injury of left heel, unstageable ([place]) 06/12/2020   Cerebral thrombosis with cerebral infarction 06/03/2020   Common bile duct (CBD) obstruction    Choledocholithiasis 05/30/2020   Cholelithiasis 05/30/2020   Hyperbilirubinemia 05/30/2020   Normocytic anemia 05/30/2020   Pancreatic duct dilated 05/30/2020   Dry age-related macular degeneration 05/06/2020   Secondary hyperparathyroidism of renal origin ([place]) 02/20/2020   Depressed mood 02/20/2020   Seizure ([place]) 03/18/2019   Transient atrial fibrillation ([place]) 03/18/2019   MCI (mild cognitive impairment) 03/18/2019   History of gout 04/29/2017   OA (osteoarthritis) of knee 12/17/2016   History of subcortical infarction 07/2020 06/13/2016   Health maintenance examination 01/30/2016   Medicare annual wellness visit, subsequent 03/10/2014   Advanced care planning/counseling discussion 03/10/2014   PAD (peripheral artery disease) ([place]) 11/11/2013   Dyspnea 09/18/2011   PMR (polymyalgia rheumatica) ([place]) 08/27/2011   Controlled diabetes mellitus type 2 with complications ([place]) 97/07/6376   Essential hypertension 06/13/2009   CKD (chronic kidney disease) stage 3, GFR 30-59 ml/min (HCC) 01/24/2009   Primary angle-closure glaucoma 11/13/2007   Osteopenia 11/13/2007   History of colon cancer 11/13/2007   Hyperglycemia due to type 2 diabetes mellitus ([place]) 10/23/2006    Past Surgical History:  Procedure Laterality Date   ABI  11/2013   WNL   APPENDECTOMY     BILIARY DILATION  05/31/2020   Procedure: BILIARY DILATION;  Surgeon: Jackquline Denmark, MD;  Location[facility]COPY;  Service: Endoscopy;;   BIOPSY  05/31/2020   Procedure: BIOPSY;  Surgeon: Jackquline Denmark, MD;  Location[facility] ENDOSCOPY;  Service: Endoscopy;;    CARPAL TUNNEL RELEASE     Bilateral    CATARACT EXTRACTION Bilateral 2006   Implants   CHOLECYSTECTOMY N/A 06/01/2020   Procedure: LAPAROSCOPIC CHOLECYSTECTOMY;  Surgeon: Clovis Riley, MD;  Location: [place] OR;  Service: General;  Laterality: N/A;   [place]   For cancer   COLONOSCOPY  2008   Dr.Stark, Due 2011   COLONOSCOPY  06/2013   1 hyperplastic polyp, ileocecal anastomosis [place])   dexa  02/2012, 03/2014   T score -2.2 at hip overall stable   ENDOSCOPIC RETROGRADE CHOLANGIOPANCREATOGRAPHY (ERCP) WITH PROPOFOL N/A 05/31/2020   Procedure: ENDOSCOPIC RETROGRADE CHOLANGIOPANCREATOGRAPHY (ERCP) WITH PROPOFOL;  Surgeon: Jackquline Denmark, MD;  Location: [place];  Service: Endoscopy;  Laterality: N/A;   REFRACTIVE SURGERY     REMOVAL OF STONES  05/31/2020   Procedure: REMOVAL OF STONES;  Surgeon: Jackquline Denmark, MD;  Location: [facility] ENDOSCOPY;  Service: Endoscopy;;   SPHINCTEROTOMY  05/31/2020   Procedure: [name];  Surgeon: Jackquline Denmark, MD;  Location: [place];  Service: Endoscopy;;   TONSILLECTOMY AND ADENOIDECTOMY     TOTAL KNEE ARTHROPLASTY Left 12/17/2016   Procedure: LEFT TOTAL KNEE ARTHROPLASTY;  Surgeon: Gaynelle Arabian, MD;  Location: [facility] ORS;  Service: Orthopedics;  Laterality: Left;    Prior to Admission medications   Medication Sig Start Date End Date Taking? Authorizing Provider  acetaminophen (TYLENOL) 325 MG tablet Take 325-650 mg by mouth every 6 (six) hours as needed (for pain).   Yes [provider]  apixaban (ELIQUIS) 2.5 MG TABS tablet Take 1 tablet (2.5 mg total) by mouth 2 (two) times daily. 03/15/21  Yes Florencia Reasons, MD  atorvastatin (LIPITOR) 40 MG tablet Take 1 tablet (40 mg total) by mouth daily at 6 PM. 06/23/20  Yes Angiulli, Lavon Paganini, PA-C  Biotin 5000 MCG TABS Take by mouth.   Yes [provider]  Carboxymethylcellul-Glycerin 1-0.25 % SOLN Place 1-2 drops into both eyes 3 (three) times daily as needed (for dry/irritated  eyes.).   Yes [provider]  carvedilol (COREG) 25 MG tablet Take 1 tablet (25 mg total) by mouth 2 (two) times daily with a meal. 03/16/21  Yes Florencia Reasons, MD  Cholecalciferol (VITAMIN D3) 25 MCG (1000 UT) CAPS Take 1 capsule (1,000 Units total) by mouth daily. 06/23/20  Yes Angiulli, Lavon Paganini, PA-C  Cinnamon 500 MG capsule Take 1,000 mg by mouth daily.   Yes [provider]  clopidogrel (PLAVIX) 75 MG tablet Take 1 tablet (75 mg total) by mouth daily. 07/25/20  Yes Penumalli, Earlean Polka, MD  colchicine 0.6 MG tablet Take 1 tablet (0.6 mg total) by mouth daily as needed (gout flare). First day of gout flare, may take 1 tablet twice daily. 07/13/20  Yes [name], MD  diclofenac Sodium (VOLTAREN) 1 % GEL Apply 2 g topically 4 (four) times daily. Patient taking differently: Apply 2 g topically 4 (four) times daily. As needed 06/23/20  Yes Angiulli, Lavon Paganini, PA-C  Ferrous Sulfate (IRON) 325 (65 Fe) MG TABS Take 1 tablet (325 mg total) by mouth See admin instructions. Take 325 mg by mouth in the evening on  Mondays and Thursdays only 06/23/20  Yes Angiulli, Lavon Paganini, PA-C  hydrALAZINE (APRESOLINE) 25 MG tablet Take 1 tablet (25 mg total) by mouth 2 (two) times daily as needed (BP > 160/100). 03/10/21  Yes [name], MD  insulin glargine (LANTUS) 100 UNIT/ML Solostar Pen Inject 8 Units into the skin daily. 06/24/20  Yes Angiulli, Lavon Paganini, PA-C  levETIRAcetam (KEPPRA) 750 MG tablet Take 1 tablet (750 mg total) by mouth 2 (two) times daily. 07/25/20  Yes Penumalli, Earlean Polka, MD  Multiple Vitamins-Minerals (PRESERVISION AREDS 2 PO) Take 1 capsule by mouth 2 (two) times daily.   Yes [provider]  pantoprazole (PROTONIX) 40 MG tablet Take 1 tablet by mouth twice daily Patient taking differently: Take 40 mg by mouth daily. 11/24/20  Yes [name], MD  CONTOUR NEXT TEST test strip 1 each 4 (four) times daily. 07/12/20   [provider]  nystatin cream  (MYCOSTATIN) Apply 1 application topically 2 (two) times daily. 07/29/20   [name], MD    Allergies Gabapentin, Sertraline, and Pravastatin sodium  Family History  Problem Relation Age of Onset   Heart attack Father 93   Stroke Father 77   Diabetes Father    Hypertension Father    Heart disease Father        before age 42   Breast cancer Mother    Cancer Mother    Diabetes Sister    Cancer Sister    Peripheral vascular disease Sister    Breast cancer Sister    Hypertension Son    COPD Neg Hx    Asthma Neg Hx    Colon cancer Neg Hx    Esophageal cancer Neg Hx    Rectal cancer Neg Hx    Stomach cancer Neg Hx     Social History Social History   Tobacco Use   Smoking status: Former    Years: 2.00    Types: Cigarettes    Quit date: 06/04/1973    Years since quitting: 47.8   Smokeless tobacco: Never   Tobacco comments:    Smoked for 5 years 1965-1970 up to 1/2 pp week  Vaping Use   Vaping Use: Never used  Substance Use Topics   Alcohol use: Not Currently   Drug use: Never    Review of Systems Constitutional: No fever/chills Eyes: No visual changes. Cardiovascular: Denies chest pain. Respiratory: Denies shortness of breath. Gastrointestinal: No abdominal pain.   Genitourinary: Negative for dysuria. Musculoskeletal: Negative for back pain. Skin: Negative for rash. Neurological: Negative for headaches, areas of focal weakness or numbness.    ____________________________________________   PHYSICAL EXAM:  VITAL SIGNS: ED Triage Vitals  Enc Vitals Group     BP 03/17/21 1130 129/73     Pulse Rate 03/17/21 1129 90     Resp 03/17/21 1129 17     Temp 03/17/21 1129 98.8 F (37.1 C)     Temp Source 03/17/21 1129 Oral     SpO2 03/17/21 1129 100 %     Weight --      Height --      Head Circumference --      Peak Flow --      Pain Score --      Pain Loc --      Pain Edu? --      Excl. in [place]? --     Constitutional: Alert and oriented to self and  being in hospital but not to year.  Chronically ill-appearing slightly  somnolent but in no acute distress.  Able to alert and carry out a conversation. Eyes: Conjunctivae are normal. Head: Atraumatic. Nose: No congestion/rhinnorhea. Mouth/Throat: Mucous membranes are moist. Neck: No stridor.  Cardiovascular: Slightly tachycardic heart rate varying from 10 5-1 30s irregular.  Grossly normal heart sounds.  Good peripheral circulation. Respiratory: Normal respiratory effort.  No retractions. Lungs CTAB. Gastrointestinal: Soft and nontender. No distention. Musculoskeletal: No lower extremity tenderness nor edema. Neurologic:  Normal speech and language. No gross focal neurologic deficits are appreciated.  She moves all extremities commands.  No focal deficits.  Fatigued and arms and legs demonstrates about 4+ strength in all extremities. Skin:  Skin is warm, dry and intact. No rash noted. Psychiatric: Mood and affect are normal. Speech and behavior are normal.  ____________________________________________   LABS (all labs ordered are listed, but only abnormal results are displayed)  Labs Reviewed  BASIC METABOLIC PANEL - Abnormal; Notable for the following components:      Result Value   Glucose, Bld 147 (*)    BUN 39 (*)    Creatinine, Ser 1.31 (*)    GFR, Estimated 40 (*)    All other components within normal limits  CBC - Abnormal; Notable for the following components:   RBC 3.46 (*)    Hemoglobin 11.4 (*)    HCT 33.1 (*)    All other components within normal limits  URINALYSIS, COMPLETE (UACMP) WITH MICROSCOPIC - Abnormal; Notable for the following components:   Color, Urine YELLOW (*)    APPearance HAZY (*)    Hgb urine dipstick LARGE (*)    Protein, ur 30 (*)    Leukocytes,Ua TRACE (*)    RBC / HPF >50 (*)    Bacteria, UA RARE (*)    All other components within normal limits  DIGOXIN LEVEL - Abnormal; Notable for the following components:   Digoxin Level <0.2 (*)    All  other components within normal limits  CBG MONITORING, ED - Abnormal; Notable for the following components:   Glucose-Capillary 132 (*)    All other components within normal limits  MAGNESIUM  LACTIC ACID, PLASMA  CBG MONITORING, ED  TROPONIN I (HIGH SENSITIVITY)  TROPONIN I (HIGH SENSITIVITY)   ____________________________________________  EKG  Reviewed inter by me at 1135 Heart rate 120 QRS 100 QTc 500 Atrial fibrillation, rapid ventricular response.  Mild nonspecific T wave abnormality.  No clear evidence of acute ischemia noted ____________________________________________  RADIOLOGY  DG Chest Portable 1 View  Result Date: 03/17/2021 CLINICAL DATA:  Weakness.  Atrial fibrillation. EXAM: PORTABLE CHEST 1 VIEW COMPARISON:  03/14/2021 FINDINGS: 11 46 hours. Low volume film. The cardio pericardial silhouette is enlarged. Interstitial markings are diffusely coarsened with chronic features. Mild vascular congestion with basilar atelectasis and small right pleural effusion. Telemetry leads overlie the chest. IMPRESSION: Low volume film with vascular congestion and small right pleural effusion. Electronically Signed   By: Misty Stanley M.D.   On: 03/17/2021 12:14    Chest x-ray reviewed low volume vascular congestion small right pleural effusion ____________________________________________   PROCEDURES  Procedure(s) performed: None  Procedures  Critical Care performed: No  ____________________________________________   INITIAL IMPRESSION / ASSESSMENT AND PLAN / ED COURSE  Pertinent labs & imaging results that were available during my care of the patient were reviewed by me and considered in my medical decision making (see chart for details).   Patient presents with fatigue associated with atrial fibrillation rapid ventricular response and hypotension.  Hypotension resolved  after receiving IV fluids by EMS and patient alert well oriented feeling improved.  Continue to show  evidence of atrial fibrillation with mild but present rapid ventricular rate.  Discussed case and seen in the ER by Dr. Rockey Situ, new medications initiated including plan for digoxin and diltiazem to  Discussed with patient and also with cardiologist, it seems reasonable to admit her to the hospital for additional care treatment and observation regarding therapies provided.  Thankfully showing good effect with regard to improved blood pressure and improve heart rate.  Patient feeling improved      ____________________________________________   FINAL CLINICAL IMPRESSION(S) / ED DIAGNOSES  Final diagnoses:  Atrial fibrillation with RVR ([place])        Note:  This document was prepared using Dragon voice recognition software and may include unintentional dictation errors       Delman Kitten, MD 03/17/21 1639

## 2021-03-17 NOTE — Assessment & Plan Note (Signed)
-  I have discussed code status with the patient and her son and  they are in agreement that the patient would not desire resuscitation and would prefer to die a natural death should that situation arise. -She has a gold out of facility DNR form at the bedside

## 2021-03-17 NOTE — ED Notes (Signed)
Patient moved to hospital bed at this time for comfort.  New PureWick applied.  Warm blankets given and patient positioned for comfort.  Family remains at bedside

## 2021-03-17 NOTE — Assessment & Plan Note (Signed)
-  Continue Keppra -?seizure prior to last admission, otherwise none recently

## 2021-03-17 NOTE — Consult Note (Signed)
Cardiology Consultation:   Patient ID: Taylor Castaneda MRN: [id number]; DOB: 03-27-1933  Admit date: 03/17/2021 Date of Consult: 03/17/2021  PCP:  [name], MD   [facility] Providers Cardiologist:  Quay Burow, MD   {   Patient Profile:   Taylor Castaneda is a 87 y.o. female with a hx of HTN, HLD, prior toabacco use, DM2, CKD stage 3, seizure disorder, memory loss, anemia, CVA on Plavix, paroxysmal afib on low dose Eliquis, colon caner s/p resection who is being seen 03/17/2021 for the evaluation of Afib at the request of Dr. Jacqualine Code.  History of Present Illness:   Taylor Castaneda is a poor historian. History provided by daughter.   Followed by Dr. Gwenlyn Found. Saw Dr. Loralyn Freshwater for dyspnea on exertion. Stress test 2013 as normal with EF 78%. She was lost to follow-up. Saw Dr. Gwenlyn Found 03/2019 via telemedicine visit as a new patient for afib. She had no symptoms at that time. Reportedly had a grand mal seizure and was hospitalized for 3 days. During hospitalization she went into afib and spontaneously converted to SR. She was not placed on oral a/c due to fall risk. She was maintained on coreg and lotensin for BP control.   She was hospitalized on 05/2020 for seizure, found to have watershed CVA on MRI.  She last followed up with neurology in 07/25/2020, transitioning to Plavix 75 mg daily starting 09/02/2020 due to severe intracranial stenosis and Lipitor 40 mg daily.  Patient recently seen in the hospital  03/15/21 by cardiology for Afib/flutter RVR, heart rates 109-140s. HR improved with cardizem drip,  She was started on Eliquis 2.5mg BID given elevated stroke risk. Coreg was increased. Plavix continued for h/o stroke.  The patient presents to [facility] ED for tachycardia/afib with hypotension. Patient remembers waking up, but is unsure how she ended up in the ER. Daughter reported this morning bps were on the low side. This am and her BP wouldn't give a reading, it was 80/50.  Pulse ox wouldn't  read. Patient was somnolent. EMS found the patient hypotensive in afib with rates up to 160s. BP did not improve with IVF.   In the ER EKG showed afib RVR, 119bpm. BP improved to 129/73. Labs showed sodium 140, potassium 4.4, Scr 1.31, BUN 39, Mag 2.1. WBC 9.8, Hgb 11.4. CXR showed low volume with vascular congestion and small right pleural effusion. LA 1.5. HS trop16.    Past Medical History:  Diagnosis Date   Anemia    Cerebral infarction ([place]) 05/2020   Diabetes mellitus, type II ([place])    Glaucoma    Dr.Hecker   History of colon cancer 1998   Dr. Lennie Hummer   Hyperlipemia    Osteopenia 02/2012, 03/2014   DEXA hip -2.2   Renal insufficiency    Seizures (HCC)    Stenosing tenosynovitis of finger of left hand 2016   index - s/p steroid injection x2   Stroke [facility])     Past Surgical History:  Procedure Laterality Date   ABI  11/2013   WNL   APPENDECTOMY     BILIARY DILATION  05/31/2020   Procedure: BILIARY DILATION;  Surgeon: Jackquline Denmark, MD;  Location: [facility] ENDOSCOPY;  Service: Endoscopy;;   BIOPSY  05/31/2020   Procedure: BIOPSY;  Surgeon: Jackquline Denmark, MD;  Location: [facility] ENDOSCOPY;  Service: Endoscopy;;   CARPAL TUNNEL RELEASE     Bilateral    CATARACT EXTRACTION Bilateral 2006   Implants   CHOLECYSTECTOMY N/A 06/01/2020   Procedure:  LAPAROSCOPIC CHOLECYSTECTOMY;  Surgeon: Clovis Riley, MD;  Location: [facility] OR;  Service: General;  Laterality: N/A;   [place]   For cancer   COLONOSCOPY  2008   Dr.Stark, Due 2011   COLONOSCOPY  06/2013   1 hyperplastic polyp, ileocecal anastomosis [facility])   dexa  02/2012, 03/2014   T score -2.2 at hip overall stable   ENDOSCOPIC RETROGRADE CHOLANGIOPANCREATOGRAPHY (ERCP) WITH PROPOFOL N/A 05/31/2020   Procedure: ENDOSCOPIC RETROGRADE CHOLANGIOPANCREATOGRAPHY (ERCP) WITH PROPOFOL;  Surgeon: Jackquline Denmark, MD;  Location[facility] ENDOSCOPY;  Service: Endoscopy;  Laterality: N/A;   REFRACTIVE SURGERY     REMOVAL OF STONES  05/31/2020    Procedure: REMOVAL OF STONES;  Surgeon: Jackquline Denmark, MD;  Location: [facility] ENDOSCOPY;  Service: Endoscopy;;   SPHINCTEROTOMY  05/31/2020   Procedure: [name];  Surgeon: Jackquline Denmark, MD;  Location: [place];  Service: Endoscopy;;   TONSILLECTOMY AND ADENOIDECTOMY     TOTAL KNEE ARTHROPLASTY Left 12/17/2016   Procedure: LEFT TOTAL KNEE ARTHROPLASTY;  Surgeon: Gaynelle Arabian, MD;  Location: [facility] ORS;  Service: Orthopedics;  Laterality: Left;     Home Medications:  Prior to Admission medications   Medication Sig Start Date End Date Taking? Authorizing Provider  acetaminophen (TYLENOL) 325 MG tablet Take 325-650 mg by mouth every 6 (six) hours as needed (for pain).   Yes [provider]  amLODipine (NORVASC) 5 MG tablet Take 1 tablet (5 mg total) by mouth daily. 09/30/20  Yes [name], MD  apixaban (ELIQUIS) 2.5 MG TABS tablet Take 1 tablet (2.5 mg total) by mouth 2 (two) times daily. 03/15/21  Yes Florencia Reasons, MD  atorvastatin (LIPITOR) 40 MG tablet Take 1 tablet (40 mg total) by mouth daily at 6 PM. 06/23/20  Yes Angiulli, Lavon Paganini, PA-C  Biotin 5000 MCG TABS Take by mouth.   Yes [provider]  Carboxymethylcellul-Glycerin 1-0.25 % SOLN Place 1-2 drops into both eyes 3 (three) times daily as needed (for dry/irritated eyes.).   Yes [provider]  carvedilol (COREG) 25 MG tablet Take 1 tablet (25 mg total) by mouth 2 (two) times daily with a meal. 03/16/21  Yes Florencia Reasons, MD  Cholecalciferol (VITAMIN D3) 25 MCG (1000 UT) CAPS Take 1 capsule (1,000 Units total) by mouth daily. 06/23/20  Yes Angiulli, Lavon Paganini, PA-C  Cinnamon 500 MG capsule Take 1,000 mg by mouth daily.   Yes [provider]  clopidogrel (PLAVIX) 75 MG tablet Take 1 tablet (75 mg total) by mouth daily. 07/25/20  Yes Penumalli, Earlean Polka, MD  colchicine 0.6 MG tablet Take 1 tablet (0.6 mg total) by mouth daily as needed (gout flare). First day of gout flare, may take 1 tablet twice daily.  07/13/20  Yes [name], MD  diclofenac Sodium (VOLTAREN) 1 % GEL Apply 2 g topically 4 (four) times daily. Patient taking differently: Apply 2 g topically 4 (four) times daily. As needed 06/23/20  Yes Angiulli, Lavon Paganini, PA-C  Ferrous Sulfate (IRON) 325 (65 Fe) MG TABS Take 1 tablet (325 mg total) by mouth See admin instructions. Take 325 mg by mouth in the evening on Mondays and Thursdays only 06/23/20  Yes Angiulli, Lavon Paganini, PA-C  hydrALAZINE (APRESOLINE) 25 MG tablet Take 1 tablet (25 mg total) by mouth 2 (two) times daily as needed (BP > 160/100). 03/10/21  Yes [name], MD  insulin glargine (LANTUS) 100 UNIT/ML Solostar Pen Inject 8 Units into the skin daily. 06/24/20  Yes Angiulli, Lavon Paganini, PA-C  levETIRAcetam (KEPPRA) 750 MG tablet Take 1 tablet (750 mg total) by mouth 2 (two) times daily. 07/25/20  Yes Penumalli, Earlean Polka, MD  losartan (COZAAR) 100 MG tablet Take 1 tablet (100 mg total) by mouth daily. 02/07/21  Yes [name], MD  Multiple Vitamins-Minerals (PRESERVISION AREDS 2 PO) Take 1 capsule by mouth 2 (two) times daily.   Yes [provider]  pantoprazole (PROTONIX) 40 MG tablet Take 1 tablet by mouth twice daily Patient taking differently: Take 40 mg by mouth daily. 11/24/20  Yes [name], MD  CONTOUR NEXT TEST test strip 1 each 4 (four) times daily. 07/12/20   [provider]  nystatin cream (MYCOSTATIN) Apply 1 application topically 2 (two) times daily. 07/29/20   [name], MD    Inpatient Medications: Scheduled Meds:  Continuous Infusions:  PRN Meds:   Allergies:    Allergies  Allergen Reactions   Gabapentin Other (See Comments)    Dizziness   Sertraline Other (See Comments)    Increased sedation, ?seizure activity   Pravastatin Sodium Other (See Comments)    "Anemia," per Dr Gilford Rile, [facility]    Social History:   Social History   Socioeconomic History   Marital status: Widowed    Spouse name: Not on file    Number of children: 4   Years of education: Not on file   Highest education level: High school graduate  Occupational History   Occupation: retired    Fish farm manager: Retired    Comment: Regulatory affairs officer  Tobacco Use   Smoking status: Former    Years: 2.00    Types: Cigarettes    Quit date: 06/04/1973    Years since quitting: 47.8   Smokeless tobacco: Never   Tobacco comments:    Smoked for 5 years 1965-1970 up to 1/2 pp week  Vaping Use   Vaping Use: Never used  Substance and Sexual Activity   Alcohol use: Not Currently   Drug use: No   Sexual activity: Never  Other Topics Concern   Not on file  Social History Narrative   Lives with son Ulysees Barns: retired Regulatory affairs officer   Activity: walking   Diet: good water, fruits/vegetables daily   Caffeine- some tea, occas soda   Social Determinants of Radio broadcast assistant Strain: Low Risk    Difficulty of Paying Living Expenses: Not very hard  Food Insecurity: Not on file  Transportation Needs: Not on file  Physical Activity: Not on file  Stress: Not on file  Social Connections: Not on file  Intimate Partner Violence: Not on file    Family History:    Family History  Problem Relation Age of Onset   Heart attack Father 102   Stroke Father 49   Diabetes Father    Hypertension Father    Heart disease Father        before age 32   Breast cancer Mother    Cancer Mother    Diabetes Sister    Cancer Sister    Peripheral vascular disease Sister    Breast cancer Sister    Hypertension Son    COPD Neg Hx    Asthma Neg Hx    Colon cancer Neg Hx    Esophageal cancer Neg Hx    Rectal cancer Neg Hx    Stomach cancer Neg Hx      ROS:  Please see the history of present illness.   All other ROS reviewed and negative.     Physical  Exam/Data:   Vitals:   03/17/21 1129 03/17/21 1130  BP:  129/73  Pulse: 90   Resp: 17   Temp: 98.8 F (37.1 C)   TempSrc: Oral   SpO2: 100% 99%   No intake or output data in the 24 hours ending  03/17/21 1236 Last 3 Weights 03/14/2021 03/10/2021 10/21/2020  Weight (lbs) 112 lb 7 oz 104 lb 4 oz 103 lb  Weight (kg) 51 kg 47.287 kg 46.72 kg     There is no height or weight on file to calculate BMI.  General:  Well nourished, well developed, in no acute distress HEENT: normal Neck: no JVD Vascular: No carotid bruits; Distal pulses 2+ bilaterally Cardiac:  normal S1, S2; Irreg IRreg; no murmur  Lungs:  clear to auscultation bilaterally, no wheezing, rhonchi or rales  Abd: soft, nontender, no hepatomegaly  Ext: no edema Musculoskeletal:  No deformities, BUE and BLE strength normal and equal Skin: warm and dry  Neuro:  CNs 2-12 intact, no focal abnormalities noted Psych:  Normal affect   EKG:  The EKG was personally reviewed and demonstrates:  Afib RVR, 119bpm, nonspecific t wave changes Telemetry:  Telemetry was personally reviewed and demonstrates:  Afib, HR 100-130s.   Relevant CV Studies:  Echo from 03/15/21:    1. Patient in afib during study.   2. Left ventricular ejection fraction, by estimation, is 60 to 65%. The  left ventricle has normal function. The left ventricle has no regional  wall motion abnormalities. Left ventricular diastolic parameters are  indeterminate.   3. Right ventricular systolic function is normal. The right ventricular  size is normal.   4. Left atrial size was mildly dilated.   5. The mitral valve is normal in structure. Trivial mitral valve  regurgitation. No evidence of mitral stenosis.   6. The aortic valve is tricuspid. Aortic valve regurgitation is mild.  Mild to moderate aortic valve sclerosis/calcification is present, without  any evidence of aortic stenosis.   7. The inferior vena cava is normal in size with greater than 50%  respiratory variability, suggesting right atrial pressure of 3 mmHg.   Laboratory Data:  High Sensitivity Troponin:   Recent Labs  Lab 03/14/21 2133 03/14/21 2353 03/17/21 1140  TROPONINIHS 14 13 16       Chemistry Recent Labs  Lab 03/14/21 2133 03/14/21 2228 03/15/21 0338 03/17/21 1140  NA 140 141 137 140  K 4.4 4.3 3.7 4.4  CL 109 111 109 109  CO2 21*  --  20* 22  GLUCOSE 197* 185* 171* 147*  BUN 45* 43* 39* 39*  CREATININE 1.23* 1.20* 1.13* 1.31*  CALCIUM 9.1  --  8.4* 9.0  MG 2.1  --   --  2.1  GFRNONAA 43*  --  47* 40*  ANIONGAP 10  --  8 9    No results for input(s): PROT, ALBUMIN, AST, ALT, ALKPHOS, BILITOT in the last 168 hours. Lipids No results for input(s): CHOL, TRIG, HDL, LABVLDL, LDLCALC, CHOLHDL in the last 168 hours.  Hematology Recent Labs  Lab 03/14/21 2133 03/14/21 2228 03/15/21 0338 03/17/21 1140  WBC 9.6  --  10.2 9.8  RBC 3.84*  --  3.45* 3.46*  HGB 12.2 11.2* 11.0* 11.4*  HCT 36.9 33.0* 33.5* 33.1*  MCV 96.1  --  97.1 95.7  MCH 31.8  --  31.9 32.9  MCHC 33.1  --  32.8 34.4  RDW 11.9  --  12.0 12.2  PLT 214  --  177  168   Thyroid  Recent Labs  Lab 03/14/21 2133  TSH 4.261    BNPNo results for input(s): BNP, PROBNP in the last 168 hours.  DDimer No results for input(s): DDIMER in the last 168 hours.   Radiology/Studies:  DG Chest 2 View  Result Date: 03/14/2021 CLINICAL DATA:  Tachycardia EXAM: CHEST - 2 VIEW COMPARISON:  06/13/2020 FINDINGS: The lungs are well expanded. Small right pleural effusion. The lungs are clear. No pneumothorax. No pleural effusion on the left. Cardiac size within normal limits. Pulmonary vascularity is normal. Osseous structures are age-appropriate. IMPRESSION: Small right pleural effusion. Electronically Signed   By: Fidela Salisbury M.D.   On: 03/14/2021 21:34   DG Chest Portable 1 View  Result Date: 03/17/2021 CLINICAL DATA:  Weakness.  Atrial fibrillation. EXAM: PORTABLE CHEST 1 VIEW COMPARISON:  03/14/2021 FINDINGS: 11 46 hours. Low volume film. The cardio pericardial silhouette is enlarged. Interstitial markings are diffusely coarsened with chronic features. Mild vascular congestion with basilar atelectasis  and small right pleural effusion. Telemetry leads overlie the chest. IMPRESSION: Low volume film with vascular congestion and small right pleural effusion. Electronically Signed   By: Misty Stanley M.D.   On: 03/17/2021 12:14   ECHOCARDIOGRAM COMPLETE  Result Date: 03/15/2021    ECHOCARDIOGRAM REPORT   Patient Name:   BRENT NOTO Date of Exam: 03/15/2021 Medical Rec #:  [id number]      Height:       60.0 in Accession #:    [id number]     Weight:       112.4 lb Date of Birth:  [birth year]      BSA:          1.461 m Patient Age:    53 years       BP:           159/66 mmHg Patient Gender: F              HR:           87 bpm. Exam Location:  Inpatient Procedure: 2D Echo, Cardiac Doppler and Color Doppler Indications:    I48.92* Unspecified atrial flutter  History:        Patient has prior history of Echocardiogram examinations, most                 recent 06/03/2020. Stroke; Risk Factors:Diabetes and                 Dyslipidemia.  Sonographer:    Bernadene Person RDCS Referring Phys: [id number] [place]  1. Patient in afib during study.  2. Left ventricular ejection fraction, by estimation, is 60 to 65%. The left ventricle has normal function. The left ventricle has no regional wall motion abnormalities. Left ventricular diastolic parameters are indeterminate.  3. Right ventricular systolic function is normal. The right ventricular size is normal.  4. Left atrial size was mildly dilated.  5. The mitral valve is normal in structure. Trivial mitral valve regurgitation. No evidence of mitral stenosis.  6. The aortic valve is tricuspid. Aortic valve regurgitation is mild. Mild to moderate aortic valve sclerosis/calcification is present, without any evidence of aortic stenosis.  7. The inferior vena cava is normal in size with greater than 50% respiratory variability, suggesting right atrial pressure of 3 mmHg. FINDINGS  Left Ventricle: Left ventricular ejection fraction, by estimation, is 60 to 65%.  The left ventricle has normal function. The left ventricle has no regional wall motion abnormalities. The  left ventricular internal cavity size was normal in size. There is  no left ventricular hypertrophy. Left ventricular diastolic parameters are indeterminate. Right Ventricle: The right ventricular size is normal. No increase in right ventricular wall thickness. Right ventricular systolic function is normal. Left Atrium: Left atrial size was mildly dilated. Right Atrium: Right atrial size was normal in size. Pericardium: There is no evidence of pericardial effusion. Mitral Valve: The mitral valve is normal in structure. There is mild thickening of the mitral valve leaflet(s). There is mild calcification of the mitral valve leaflet(s). Mild mitral annular calcification. Trivial mitral valve regurgitation. No evidence  of mitral valve stenosis. Tricuspid Valve: The tricuspid valve is normal in structure. Tricuspid valve regurgitation is not demonstrated. No evidence of tricuspid stenosis. Aortic Valve: The aortic valve is tricuspid. Aortic valve regurgitation is mild. Aortic regurgitation PHT measures 459 msec. Mild to moderate aortic valve sclerosis/calcification is present, without any evidence of aortic stenosis. Pulmonic Valve: The pulmonic valve was normal in structure. Pulmonic valve regurgitation is not visualized. No evidence of pulmonic stenosis. Aorta: The aortic root is normal in size and structure. Venous: The inferior vena cava is normal in size with greater than 50% respiratory variability, suggesting right atrial pressure of 3 mmHg. IAS/Shunts: No atrial level shunt detected by color flow Doppler. Additional Comments: Patient in afib during study.  LEFT VENTRICLE PLAX 2D LVIDd:         2.30 cm LVIDs:         1.70 cm LV PW:         1.10 cm LV IVS:        1.10 cm LVOT diam:     1.90 cm LV SV:         51 LV SV Index:   35 LVOT Area:     2.84 cm  RIGHT VENTRICLE TAPSE (M-mode): 1.0 cm LEFT ATRIUM              Index        RIGHT ATRIUM          Index LA diam:        3.10 cm 2.12 cm/m   RA Area:     9.07 cm LA Vol (A2C):   29.9 ml 20.46 ml/m  RA Volume:   15.60 ml 10.68 ml/m LA Vol (A4C):   38.4 ml 26.28 ml/m LA Biplane Vol: 33.9 ml 23.20 ml/m  AORTIC VALVE LVOT Vmax:   87.60 cm/s LVOT Vmean:  59.250 cm/s LVOT VTI:    0.181 m AI PHT:      459 msec  AORTA Ao Root diam: 2.50 cm Ao Asc diam:  2.50 cm  SHUNTS Systemic VTI:  0.18 m Systemic Diam: 1.90 cm Jenkins Rouge MD Electronically signed by Jenkins Rouge MD Signature Date/Time: 03/15/2021/10:19:26 AM    Final      Assessment and Plan:   Paroxysmal Afib - recently seen in the hospital 10/12 for afib. Eliquis 2.5mg  BID started for CHADSVASC at least 7 (h/o stroke). Coreg was increased to 25mg BID for rate control - presents with hypotension and rapid afib. Heart rates up to 130s - BP has since improved, most recent 130/69 - stop amlodipine and losartan - Echo 03/2021 showed LVEF 60-65%, no WMA, mildly dilated LA, rivial MR, mild AI - continue rate control with coreg. Can add on dilt if rate is not controlled and BP can tolerate - may need to consider cardioversion to get patient back into SR. If stable can consider OP.  H/o Ischemic CVA - continue plavix, may consider stopping given ELiquis  HTN - PTA losartan, amlodipine, coreg - reported that PCP recently increased losartan and added amlodipine within the last 2 months - coreg was recently increase as above for better rate control of afib - given hypotension stop ARB and amlodipine - If bp can tolerate add low dose diltiazem  For questions or updates, please contact [facility] Please consult [email or web address] for contact info under    Signed, Tarnesha Ulloa Ninfa Meeker, PA-C  03/17/2021 12:36 PM

## 2021-03-17 NOTE — Assessment & Plan Note (Addendum)
-  Patient presenting with recurrent afib with RVR. -She was hospitalized for this from 10/11-12 and discharged 2 days ago. -Since then, she has been increasingly somnolent and with EMS was found to be in RVR again with hypotension (resolved).  -Will admit to progressive care unit; patient needs admission based on failed outpatient therapy. -HS troponin negative x 2. -Will consult cardiology; they have seen the patient and recommend initiation of Digoxin and Cardizem -Continue Coreg -Continue home Eliquis (started during last hospitalization).

## 2021-03-18 DIAGNOSIS — Z66 Do not resuscitate: Secondary | ICD-10-CM | POA: Diagnosis not present

## 2021-03-18 DIAGNOSIS — I10 Essential (primary) hypertension: Secondary | ICD-10-CM

## 2021-03-18 DIAGNOSIS — E118 Type 2 diabetes mellitus with unspecified complications: Secondary | ICD-10-CM | POA: Diagnosis not present

## 2021-03-18 DIAGNOSIS — I4891 Unspecified atrial fibrillation: Secondary | ICD-10-CM | POA: Diagnosis not present

## 2021-03-18 DIAGNOSIS — N1832 Chronic kidney disease, stage 3b: Secondary | ICD-10-CM | POA: Diagnosis not present

## 2021-03-18 DIAGNOSIS — R569 Unspecified convulsions: Secondary | ICD-10-CM

## 2021-03-18 DIAGNOSIS — Z794 Long term (current) use of insulin: Secondary | ICD-10-CM

## 2021-03-18 LAB — BASIC METABOLIC PANEL
Anion gap: 8 (ref 5–15)
BUN: 33 mg/dL — ABNORMAL HIGH (ref 8–23)
CO2: 21 mmol/L — ABNORMAL LOW (ref 22–32)
Calcium: 8.7 mg/dL — ABNORMAL LOW (ref 8.9–10.3)
Chloride: 109 mmol/L (ref 98–111)
Creatinine, Ser: 1.05 mg/dL — ABNORMAL HIGH (ref 0.44–1.00)
GFR, Estimated: 52 mL/min — ABNORMAL LOW (ref >60)
Glucose, Bld: 128 mg/dL — ABNORMAL HIGH (ref 70–99)
Potassium: 4.1 mmol/L (ref 3.5–5.1)
Sodium: 138 mmol/L (ref 135–145)

## 2021-03-18 LAB — CBC
HCT: 32.5 % — ABNORMAL LOW (ref 36.0–46.0)
Hemoglobin: 10.9 g/dL — ABNORMAL LOW (ref 12.0–15.0)
MCH: 31.6 pg (ref 26.0–34.0)
MCHC: 33.5 g/dL (ref 30.0–36.0)
MCV: 94.2 fL (ref 80.0–100.0)
Platelets: 168 10*3/uL (ref 150–400)
RBC: 3.45 MIL/uL — ABNORMAL LOW (ref 3.87–5.11)
RDW: 12 % (ref 11.5–15.5)
WBC: 10.4 10*3/uL (ref 4.0–10.5)
nRBC: 0 % (ref 0.0–0.2)

## 2021-03-18 LAB — CBG MONITORING, ED
Glucose-Capillary: 131 mg/dL — ABNORMAL HIGH (ref 70–99)
Glucose-Capillary: 177 mg/dL — ABNORMAL HIGH (ref 70–99)

## 2021-03-18 MED ORDER — DILTIAZEM HCL ER COATED BEADS 120 MG PO CP24: 120.0000 mg | ORAL_CAPSULE | Freq: Every day | ORAL | 11 refills | Status: AC

## 2021-03-18 NOTE — Progress Notes (Addendum)
Progress Note  Patient Name: Taylor SUNGA Date of Encounter: 03/18/2021  [facility] Cardiologist: Quay Burow, MD   Subjective   Bps better today to mildly elevated. Remains in afib with rates in 60-80s. She is asymptomatic.   Inpatient Medications    Scheduled Meds:  apixaban  2.5 mg Oral BID   atorvastatin  40 mg Oral QPM   carvedilol  25 mg Oral BID WC   clopidogrel  75 mg Oral QPM   digoxin  0.125 mg Oral Daily   diltiazem  120 mg Oral Daily   insulin aspart  0-15 Units Subcutaneous TID WC   insulin glargine-yfgn  8 Units Subcutaneous Daily   levETIRAcetam  750 mg Oral BID   pantoprazole  40 mg Oral Daily   Continuous Infusions:  PRN Meds: acetaminophen **OR** acetaminophen, hydrALAZINE, ondansetron **OR** ondansetron (ZOFRAN) IV   Vital Signs    Vitals:   03/18/21 0000 03/18/21 0200 03/18/21 0400 03/18/21 0600  BP: (!) 152/59 (!) 159/60 (!) 149/77 (!) 156/58  Pulse: 69 73 (!) 42 72  Resp: 12 (!) 24 19 (!) 21  Temp:      TempSrc:      SpO2: 100% 100% 100% 100%   No intake or output data in the 24 hours ending 03/18/21 0740 Last 3 Weights 03/14/2021 03/10/2021 10/21/2020  Weight (lbs) 112 lb 7 oz 104 lb 4 oz 103 lb  Weight (kg) 51 kg 47.287 kg 46.72 kg      Telemetry    Afib HR 60-80s, occasional elevated - Personally Reviewed  ECG    No new - Personally Reviewed  Physical Exam   GEN: No acute distress.   Neck: No JVD Cardiac: Irreg Irreg, no murmurs, rubs, or gallops.  Respiratory: Clear to auscultation bilaterally. GI: Soft, nontender, non-distended  MS: No edema; No deformity. Neuro:  Nonfocal  Psych: Normal affect   Labs    High Sensitivity Troponin:   Recent Labs  Lab 03/14/21 2133 03/14/21 2353 03/17/21 1140 03/17/21 1320  TROPONINIHS 14 13 16 10      Chemistry Recent Labs  Lab 03/14/21 2133 03/14/21 2228 03/15/21 0338 03/17/21 1140 03/18/21 0542  NA 140   < > 137 140 138  K 4.4   < > 3.7 4.4 4.1  CL 109   < >  109 109 109  CO2 21*  --  20* 22 21*  GLUCOSE 197*   < > 171* 147* 128*  BUN 45*   < > 39* 39* 33*  CREATININE 1.23*   < > 1.13* 1.31* 1.05*  CALCIUM 9.1  --  8.4* 9.0 8.7*  MG 2.1  --   --  2.1  --   GFRNONAA 43*  --  47* 40* 52*  ANIONGAP 10  --  8 9 8    < > = values in this interval not displayed.    Lipids No results for input(s): CHOL, TRIG, HDL, LABVLDL, LDLCALC, CHOLHDL in the last 168 hours.  Hematology Recent Labs  Lab 03/15/21 0338 03/17/21 1140 03/18/21 0542  WBC 10.2 9.8 10.4  RBC 3.45* 3.46* 3.45*  HGB 11.0* 11.4* 10.9*  HCT 33.5* 33.1* 32.5*  MCV 97.1 95.7 94.2  MCH 31.9 32.9 31.6  MCHC 32.8 34.4 33.5  RDW 12.0 12.2 12.0  PLT 177 168 168   Thyroid  Recent Labs  Lab 03/14/21 2133  TSH 4.261    BNPNo results for input(s): BNP, PROBNP in the last 168 hours.  DDimer No results  for input(s): DDIMER in the last 168 hours.   Radiology    DG Chest Portable 1 View  Result Date: 03/17/2021 CLINICAL DATA:  Weakness.  Atrial fibrillation. EXAM: PORTABLE CHEST 1 VIEW COMPARISON:  03/14/2021 FINDINGS: 11 46 hours. Low volume film. The cardio pericardial silhouette is enlarged. Interstitial markings are diffusely coarsened with chronic features. Mild vascular congestion with basilar atelectasis and small right pleural effusion. Telemetry leads overlie the chest. IMPRESSION: Low volume film with vascular congestion and small right pleural effusion. Electronically Signed   By: Misty Stanley M.D.   On: 03/17/2021 12:14    Cardiac Studies   Echo from 03/15/21:    1. Patient in afib during study.   2. Left ventricular ejection fraction, by estimation, is 60 to 65%. The  left ventricle has normal function. The left ventricle has no regional  wall motion abnormalities. Left ventricular diastolic parameters are  indeterminate.   3. Right ventricular systolic function is normal. The right ventricular  size is normal.   4. Left atrial size was mildly dilated.   5. The  mitral valve is normal in structure. Trivial mitral valve  regurgitation. No evidence of mitral stenosis.   6. The aortic valve is tricuspid. Aortic valve regurgitation is mild.  Mild to moderate aortic valve sclerosis/calcification is present, without  any evidence of aortic stenosis.   7. The inferior vena cava is normal in size with greater than 50%  respiratory variability, suggesting right atrial pressure of 3 mmHg.   Patient Profile     87 y.o. female  with a hx of HTN, HLD, prior toabacco use, DM2, CKD stage 3, seizure disorder, memory loss, anemia, CVA on Plavix, paroxysmal afib on low dose Eliquis, colon caner s/p resection who is being seen 03/17/2021 for the evaluation of Afib   Assessment & Plan    Paroxysmal Afib - recently seen in the hospital at [facility] 10/12 for afib. Eliquis 2.5mg  BID started for CHADSVASC at least 7 (h/o stroke). Coreg was increased to 25mg BID for rate control - presented to [facility] with hypotension and rapid afib. Home losartan and amlodipine held - BP has since improved and afib is well controlled - Echo 03/2021 showed LVEF 60-65%, no WMA, mildly dilated LA, rivial MR, mild AI - continue rate control with core 25mg  BID  - diltiazem 120mg  added - started on digoxin 0.25mg  daily - she is asymptomatic - may need to consider cardioversion to get patient back into SR, can possible be down as OP, however family would like to avoid given underlying dementia  H/o Ischemic CVA - continue plavix, may consider stopping given Eliquis   HTN - PTA losartan, amlodipine, coreg - reported that PCP recently increased losartan and added amlodipine within the last 2 months - coreg was recently increased as above for better rate control of afib - given hypotension stop ARB and amlodipine - diltiazem 120mg   - Bps mildly elevated     For questions or updates, please contact [facility] Please consult [email or web address] for contact info under        Signed, Cadence Ninfa Meeker, PA-C  03/18/2021, 7:40 AM    I have seen and examined this patient with Cadence Furth.  Agree with above, note added to reflect my findings.  On exam, irregular, tachycardic, lungs clear.  Patient currently feeling well.  She is unaware of her atrial fibrillation.  Throughout the day today and overnight, her heart rates have been well controlled.  They are mildly elevated  currently.  Though she is asymptomatic, would be okay for discharge with follow-up in cardiology clinic.  We Rayvon Brandvold discharge on her current medications of Coreg 25 mg twice daily, diltiazem 120 mg daily.  I have asked the nurse to ambulate her, and as long as her heart rate is not significantly elevated, would be okay for discharge.  If heart rate is elevated, would increase diltiazem to 180 mg.  No further cardiac work-up necessary.  Arnesha Schiraldi M. Kaysee Hergert MD 03/18/2021 1:30 PM

## 2021-03-18 NOTE — ED Notes (Signed)
Patient continues to rest quietly in bed. Respirations even and unlabored.  Family at bedside.

## 2021-03-18 NOTE — Discharge Summary (Signed)
Physician Discharge Summary  Taylor Castaneda:[id number] DOB: Aug 31, 1932 DOA: 03/17/2021  PCP: [name], MD  Admit date: 03/17/2021 Discharge date: 03/18/2021  Admitted From: Observation Disposition: home  Recommendations for Outpatient Follow-up:  Follow up with PCP in 1-2 weeks Please obtain BMP/CBC in one week Please follow up on the following pending results:  Home Health:No Equipment/Devices:no new equip  Discharge Condition:Stable CODE STATUS:DNR Diet recommendation: Diabetic diet  Brief/Interim Summary: 87 year old female with a history of hypertension, hyperlipidemia, prior tobacco use, diabetes, CKD stage III, seizure disorder, memory loss with Alzheimer's, anemia, CVA on Plavix, paroxysmal A. fib on low-dose Eliquis, status post colon cancer with resection who was seen for evaluation of atrial fibrillation.  Patient was seen by cardiology in consultation with adjustment of medications to diltiazem 120 mg daily and Coreg 25 mg daily. Patient has been started on diltiazem in the hospital but per electrophysiology evaluation on October 15 with addendum to note do not recommend continuing that.  Patient was ambulated in the ER and did well without complication maintain heart rate and stable for discharge home today with cardiology follow-up.  Patient continue home medications for medical problems as noted above without acute change.  Further monitoring to her PCP her losartan and amlodipine were discontinued  Discharge Diagnoses:  Principal Problem:   Atrial fibrillation with RVR (HCC) Active Problems:   Controlled diabetes mellitus type 2 with complications (HCC)   Essential hypertension   CKD (chronic kidney disease) stage 3, GFR 30-59 ml/min (HCC)   Seizure (False Pass)   DNR (do not resuscitate)    Discharge Instructions  Discharge Instructions     Call MD for:   Complete by: As directed    Sustained Elevated hr >100   Call MD for:  difficulty  breathing, headache or visual disturbances   Complete by: As directed    Call MD for:  temperature >100.4   Complete by: As directed    Diet - low sodium heart healthy   Complete by: As directed    Increase activity slowly   Complete by: As directed       Allergies as of 03/18/2021       Reactions   Gabapentin Other (See Comments)   Dizziness   Sertraline Other (See Comments)   Increased sedation, ?seizure activity   Pravastatin Sodium Other (See Comments)   "Anemia," per Dr Gilford Rile, [facility]        Medication List     STOP taking these medications    amLODipine 5 MG tablet Commonly known as: NORVASC   losartan 100 MG tablet Commonly known as: Cozaar       TAKE these medications    acetaminophen 325 MG tablet Commonly known as: TYLENOL Take 325-650 mg by mouth every 6 (six) hours as needed (for pain).   apixaban 2.5 MG Tabs tablet Commonly known as: ELIQUIS Take 1 tablet (2.5 mg total) by mouth 2 (two) times daily.   atorvastatin 40 MG tablet Commonly known as: LIPITOR Take 1 tablet (40 mg total) by mouth daily at 6 PM.   Biotin 5000 MCG Tabs Take by mouth.   Carboxymethylcellul-Glycerin 1-0.25 % Soln Place 1-2 drops into both eyes 3 (three) times daily as needed (for dry/irritated eyes.).   carvedilol 25 MG tablet Commonly known as: COREG Take 1 tablet (25 mg total) by mouth 2 (two) times daily with a meal.   Cinnamon 500 MG capsule Take 1,000 mg by mouth daily.   clopidogrel 75 MG tablet Commonly known  as: PLAVIX Take 1 tablet (75 mg total) by mouth daily.   colchicine 0.6 MG tablet Take 1 tablet (0.6 mg total) by mouth daily as needed (gout flare). First day of gout flare, may take 1 tablet twice daily.   Contour Next Test test strip Generic drug: glucose blood 1 each 4 (four) times daily.   diclofenac Sodium 1 % Gel Commonly known as: VOLTAREN Apply 2 g topically 4 (four) times daily. What changed: additional instructions    diltiazem 120 MG 24 hr capsule Commonly known as: Cardizem CD Take 1 capsule (120 mg total) by mouth daily.   hydrALAZINE 25 MG tablet Commonly known as: APRESOLINE Take 1 tablet (25 mg total) by mouth 2 (two) times daily as needed (BP > 160/100).   insulin glargine 100 UNIT/ML Solostar Pen Commonly known as: LANTUS Inject 8 Units into the skin daily.   Iron 325 (65 Fe) MG Tabs Take 1 tablet (325 mg total) by mouth See admin instructions. Take 325 mg by mouth in the evening on Mondays and Thursdays only   levETIRAcetam 750 MG tablet Commonly known as: KEPPRA Take 1 tablet (750 mg total) by mouth 2 (two) times daily.   nystatin cream Commonly known as: MYCOSTATIN Apply 1 application topically 2 (two) times daily.   pantoprazole 40 MG tablet Commonly known as: PROTONIX Take 1 tablet by mouth twice daily What changed: when to take this   PRESERVISION AREDS 2 PO Take 1 capsule by mouth 2 (two) times daily.   Vitamin D3 25 MCG (1000 UT) Caps Take 1 capsule (1,000 Units total) by mouth daily.        Allergies  Allergen Reactions   Gabapentin Other (See Comments)    Dizziness   Sertraline Other (See Comments)    Increased sedation, ?seizure activity   Pravastatin Sodium Other (See Comments)    "Anemia," per Dr Gilford Rile, [facility]    Consultations: cardiology   Procedures/Studies: DG Chest 2 View  Result Date: 03/14/2021 CLINICAL DATA:  Tachycardia EXAM: CHEST - 2 VIEW COMPARISON:  06/13/2020 FINDINGS: The lungs are well expanded. Small right pleural effusion. The lungs are clear. No pneumothorax. No pleural effusion on the left. Cardiac size within normal limits. Pulmonary vascularity is normal. Osseous structures are age-appropriate. IMPRESSION: Small right pleural effusion. Electronically Signed   By: Fidela Salisbury M.D.   On: 03/14/2021 21:34   DG Chest Portable 1 View  Result Date: 03/17/2021 CLINICAL DATA:  Weakness.  Atrial fibrillation. EXAM: PORTABLE  CHEST 1 VIEW COMPARISON:  03/14/2021 FINDINGS: 11 46 hours. Low volume film. The cardio pericardial silhouette is enlarged. Interstitial markings are diffusely coarsened with chronic features. Mild vascular congestion with basilar atelectasis and small right pleural effusion. Telemetry leads overlie the chest. IMPRESSION: Low volume film with vascular congestion and small right pleural effusion. Electronically Signed   By: Misty Stanley M.D.   On: 03/17/2021 12:14   ECHOCARDIOGRAM COMPLETE  Result Date: 03/15/2021    ECHOCARDIOGRAM REPORT   Patient Name:   BRESHA HOSACK Date of Exam: 03/15/2021 Medical Rec #:  [id number]      Height:       60.0 in Accession #:    [id number]     Weight:       112.4 lb Date of Birth:  Sep 04, 1932      BSA:          1.461 m Patient Age:    24 years       BP:  159/66 mmHg Patient Gender: F              HR:           87 bpm. Exam Location:  Inpatient Procedure: 2D Echo, Cardiac Doppler and Color Doppler Indications:    I48.92* Unspecified atrial flutter  History:        Patient has prior history of Echocardiogram examinations, most                 recent 06/03/2020. Stroke; Risk Factors:Diabetes and                 Dyslipidemia.  Sonographer:    Bernadene Person RDCS Referring Phys: [id number] [place]  1. Patient in afib during study.  2. Left ventricular ejection fraction, by estimation, is 60 to 65%. The left ventricle has normal function. The left ventricle has no regional wall motion abnormalities. Left ventricular diastolic parameters are indeterminate.  3. Right ventricular systolic function is normal. The right ventricular size is normal.  4. Left atrial size was mildly dilated.  5. The mitral valve is normal in structure. Trivial mitral valve regurgitation. No evidence of mitral stenosis.  6. The aortic valve is tricuspid. Aortic valve regurgitation is mild. Mild to moderate aortic valve sclerosis/calcification is present, without any evidence of  aortic stenosis.  7. The inferior vena cava is normal in size with greater than 50% respiratory variability, suggesting right atrial pressure of 3 mmHg. FINDINGS  Left Ventricle: Left ventricular ejection fraction, by estimation, is 60 to 65%. The left ventricle has normal function. The left ventricle has no regional wall motion abnormalities. The left ventricular internal cavity size was normal in size. There is  no left ventricular hypertrophy. Left ventricular diastolic parameters are indeterminate. Right Ventricle: The right ventricular size is normal. No increase in right ventricular wall thickness. Right ventricular systolic function is normal. Left Atrium: Left atrial size was mildly dilated. Right Atrium: Right atrial size was normal in size. Pericardium: There is no evidence of pericardial effusion. Mitral Valve: The mitral valve is normal in structure. There is mild thickening of the mitral valve leaflet(s). There is mild calcification of the mitral valve leaflet(s). Mild mitral annular calcification. Trivial mitral valve regurgitation. No evidence  of mitral valve stenosis. Tricuspid Valve: The tricuspid valve is normal in structure. Tricuspid valve regurgitation is not demonstrated. No evidence of tricuspid stenosis. Aortic Valve: The aortic valve is tricuspid. Aortic valve regurgitation is mild. Aortic regurgitation PHT measures 459 msec. Mild to moderate aortic valve sclerosis/calcification is present, without any evidence of aortic stenosis. Pulmonic Valve: The pulmonic valve was normal in structure. Pulmonic valve regurgitation is not visualized. No evidence of pulmonic stenosis. Aorta: The aortic root is normal in size and structure. Venous: The inferior vena cava is normal in size with greater than 50% respiratory variability, suggesting right atrial pressure of 3 mmHg. IAS/Shunts: No atrial level shunt detected by color flow Doppler. Additional Comments: Patient in afib during study.  LEFT  VENTRICLE PLAX 2D LVIDd:         2.30 cm LVIDs:         1.70 cm LV PW:         1.10 cm LV IVS:        1.10 cm LVOT diam:     1.90 cm LV SV:         51 LV SV Index:   35 LVOT Area:     2.84 cm  RIGHT VENTRICLE  TAPSE (M-mode): 1.0 cm LEFT ATRIUM             Index        RIGHT ATRIUM          Index LA diam:        3.10 cm 2.12 cm/m   RA Area:     9.07 cm LA Vol (A2C):   29.9 ml 20.46 ml/m  RA Volume:   15.60 ml 10.68 ml/m LA Vol (A4C):   38.4 ml 26.28 ml/m LA Biplane Vol: 33.9 ml 23.20 ml/m  AORTIC VALVE LVOT Vmax:   87.60 cm/s LVOT Vmean:  59.250 cm/s LVOT VTI:    0.181 m AI PHT:      459 msec  AORTA Ao Root diam: 2.50 cm Ao Asc diam:  2.50 cm  SHUNTS Systemic VTI:  0.18 m Systemic Diam: 1.90 cm Jenkins Rouge MD Electronically signed by Jenkins Rouge MD Signature Date/Time: 03/15/2021/10:19:26 AM    Final       Subjective: Patient doing well heart rate controlled requested to be discharged home, daughter at bedside in agreement with plan  Discharge Exam: Vitals:   03/18/21 1245 03/18/21 1300  BP:    Pulse: 91 65  Resp: 13 19  Temp:    SpO2: 100% 100%   Vitals:   03/18/21 1200 03/18/21 1230 03/18/21 1245 03/18/21 1300  BP: (!) 144/61     Pulse: (!) 56 63 91 65  Resp: 20 (!) 22 13 19   Temp:      TempSrc:      SpO2: 100% 100% 100% 100%    General: Pt is alert, awake, not in acute distress Cardiovascular: Irregular irregular rate controlled Respiratory: CTA bilaterally, no wheezing, no rhonchi Abdominal: Soft, NT, ND, bowel sounds + Extremities: no edema, no cyanosis    The results of significant diagnostics from this hospitalization (including imaging, microbiology, ancillary and laboratory) are listed below for reference.     Microbiology: Recent Results (from the past 240 hour(s))  Resp Panel by RT-PCR (Flu A&B, Covid) Nasopharyngeal Swab     Status: None   Collection Time: 03/15/21 12:21 AM   Specimen: Nasopharyngeal Swab; Nasopharyngeal(NP) swabs in vial transport medium   Result Value Ref Range Status   SARS Coronavirus 2 by RT PCR NEGATIVE NEGATIVE Final    Comment: (NOTE) SARS-CoV-2 target nucleic acids are NOT DETECTED.  The SARS-CoV-2 RNA is generally detectable in upper respiratory specimens during the acute phase of infection. The lowest concentration of SARS-CoV-2 viral copies this assay can detect is 138 copies/mL. A negative result does not preclude SARS-Cov-2 infection and should not be used as the sole basis for treatment or other patient management decisions. A negative result may occur with  improper specimen collection/handling, submission of specimen other than nasopharyngeal swab, presence of viral mutation(s) within the areas targeted by this assay, and inadequate number of viral copies(<138 copies/mL). A negative result must be combined with clinical observations, patient history, and epidemiological information. The expected result is Negative.  Fact Sheet for Patients:  EntrepreneurPulse.com.au  Fact Sheet for Healthcare Providers:  IncredibleEmployment.be  This test is no t yet approved or cleared by the [place] FDA and  has been authorized for detection and/or diagnosis of SARS-CoV-2 by FDA under an Emergency Use Authorization (EUA). This EUA will remain  in effect (meaning this test can be used) for the duration of the COVID-19 declaration under Section 564(b)(1) of the Act, 21 U.S.C.section 360bbb-3(b)(1), unless the authorization is terminated  or revoked sooner.       Influenza A by PCR NEGATIVE NEGATIVE Final   Influenza B by PCR NEGATIVE NEGATIVE Final    Comment: (NOTE) The Xpert Xpress SARS-CoV-2/FLU/RSV plus assay is intended as an aid in the diagnosis of influenza from Nasopharyngeal swab specimens and should not be used as a sole basis for treatment. Nasal washings and aspirates are unacceptable for Xpert Xpress SARS-CoV-2/FLU/RSV testing.  Fact Sheet for  Patients: EntrepreneurPulse.com.au  Fact Sheet for Healthcare Providers: IncredibleEmployment.be  This test is not yet approved or cleared by the [place] FDA and has been authorized for detection and/or diagnosis of SARS-CoV-2 by FDA under an Emergency Use Authorization (EUA). This EUA will remain in effect (meaning this test can be used) for the duration of the COVID-19 declaration under Section 564(b)(1) of the Act, 21 U.S.C. section 360bbb-3(b)(1), unless the authorization is terminated or revoked.  Performed at [facility], [place] [address], [place], [place] [address]   Resp Panel by RT-PCR (Flu A&B, Covid) Nasopharyngeal Swab     Status: None   Collection Time: 03/17/21  4:56 PM   Specimen: Nasopharyngeal Swab; Nasopharyngeal(NP) swabs in vial transport medium  Result Value Ref Range Status   SARS Coronavirus 2 by RT PCR NEGATIVE NEGATIVE Final    Comment: (NOTE) SARS-CoV-2 target nucleic acids are NOT DETECTED.  The SARS-CoV-2 RNA is generally detectable in upper respiratory specimens during the acute phase of infection. The lowest concentration of SARS-CoV-2 viral copies this assay can detect is 138 copies/mL. A negative result does not preclude SARS-Cov-2 infection and should not be used as the sole basis for treatment or other patient management decisions. A negative result may occur with  improper specimen collection/handling, submission of specimen other than nasopharyngeal swab, presence of viral mutation(s) within the areas targeted by this assay, and inadequate number of viral copies(<138 copies/mL). A negative result must be combined with clinical observations, patient history, and epidemiological information. The expected result is Negative.  Fact Sheet for Patients:  EntrepreneurPulse.com.au  Fact Sheet for Healthcare Providers:  IncredibleEmployment.be  This test is no t yet  approved or cleared by the [place] FDA and  has been authorized for detection and/or diagnosis of SARS-CoV-2 by FDA under an Emergency Use Authorization (EUA). This EUA will remain  in effect (meaning this test can be used) for the duration of the COVID-19 declaration under Section 564(b)(1) of the Act, 21 U.S.C.section 360bbb-3(b)(1), unless the authorization is terminated  or revoked sooner.       Influenza A by PCR NEGATIVE NEGATIVE Final   Influenza B by PCR NEGATIVE NEGATIVE Final    Comment: (NOTE) The Xpert Xpress SARS-CoV-2/FLU/RSV plus assay is intended as an aid in the diagnosis of influenza from Nasopharyngeal swab specimens and should not be used as a sole basis for treatment. Nasal washings and aspirates are unacceptable for Xpert Xpress SARS-CoV-2/FLU/RSV testing.  Fact Sheet for Patients: EntrepreneurPulse.com.au  Fact Sheet for Healthcare Providers: IncredibleEmployment.be  This test is not yet approved or cleared by the [place] FDA and has been authorized for detection and/or diagnosis of SARS-CoV-2 by FDA under an Emergency Use Authorization (EUA). This EUA will remain in effect (meaning this test can be used) for the duration of the COVID-19 declaration under Section 564(b)(1) of the Act, 21 U.S.C. section 360bbb-3(b)(1), unless the authorization is terminated or revoked.  Performed at [facility], [address] [place], [place] [address]      Labs: BNP (  last 3 results) No results for input(s): BNP in the last 8760 hours. Basic Metabolic Panel: Recent Labs  Lab 03/14/21 2133 03/14/21 2228 03/15/21 0338 03/17/21 1140 03/18/21 0542  NA 140 141 137 140 138  K 4.4 4.3 3.7 4.4 4.1  CL 109 111 109 109 109  CO2 21*  --  20* 22 21*  GLUCOSE 197* 185* 171* 147* 128*  BUN 45* 43* 39* 39* 33*  CREATININE 1.23* 1.20* 1.13* 1.31* 1.05*  CALCIUM 9.1  --  8.4* 9.0 8.7*  MG 2.1  --   --  2.1  --     Liver Function Tests: No results for input(s): AST, ALT, ALKPHOS, BILITOT, PROT, ALBUMIN in the last 168 hours. No results for input(s): LIPASE, AMYLASE in the last 168 hours. No results for input(s): AMMONIA in the last 168 hours. CBC: Recent Labs  Lab 03/14/21 2133 03/14/21 2228 03/15/21 0338 03/17/21 1140 03/18/21 0542  WBC 9.6  --  10.2 9.8 10.4  NEUTROABS 5.9  --   --   --   --   HGB 12.2 11.2* 11.0* 11.4* 10.9*  HCT 36.9 33.0* 33.5* 33.1* 32.5*  MCV 96.1  --  97.1 95.7 94.2  PLT 214  --  177 168 168   Cardiac Enzymes: No results for input(s): CKTOTAL, CKMB, CKMBINDEX, TROPONINI in the last 168 hours. BNP: Invalid input(s): POCBNP CBG: Recent Labs  Lab 03/17/21 1133 03/17/21 1722 03/17/21 2302 03/18/21 0727 03/18/21 1313  GLUCAP 132* 120* 135* 131* 177*   D-Dimer No results for input(s): DDIMER in the last 72 hours. Hgb A1c No results for input(s): HGBA1C in the last 72 hours. Lipid Profile No results for input(s): CHOL, HDL, LDLCALC, TRIG, CHOLHDL, LDLDIRECT in the last 72 hours. Thyroid function studies No results for input(s): TSH, T4TOTAL, T3FREE, THYROIDAB in the last 72 hours.  Invalid input(s): FREET3 Anemia work up No results for input(s): VITAMINB12, FOLATE, FERRITIN, TIBC, IRON, RETICCTPCT in the last 72 hours. Urinalysis    Component Value Date/Time   COLORURINE YELLOW (A) 03/17/2021 1320   APPEARANCEUR HAZY (A) 03/17/2021 1320   LABSPEC 1.016 03/17/2021 1320   PHURINE 5.0 03/17/2021 1320   GLUCOSEU NEGATIVE 03/17/2021 1320   HGBUR LARGE (A) 03/17/2021 1320   HGBUR large 06/08/2009 1435   BILIRUBINUR NEGATIVE 03/17/2021 1320   KETONESUR NEGATIVE 03/17/2021 1320   PROTEINUR 30 (A) 03/17/2021 1320   UROBILINOGEN 0.2 03/08/2011 2011   NITRITE NEGATIVE 03/17/2021 1320   LEUKOCYTESUR TRACE (A) 03/17/2021 1320   Sepsis Labs Invalid input(s): PROCALCITONIN,  WBC,  LACTICIDVEN Microbiology Recent Results (from the past 240 hour(s))  Resp  Panel by RT-PCR (Flu A&B, Covid) Nasopharyngeal Swab     Status: None   Collection Time: 03/15/21 12:21 AM   Specimen: Nasopharyngeal Swab; Nasopharyngeal(NP) swabs in vial transport medium  Result Value Ref Range Status   SARS Coronavirus 2 by RT PCR NEGATIVE NEGATIVE Final    Comment: (NOTE) SARS-CoV-2 target nucleic acids are NOT DETECTED.  The SARS-CoV-2 RNA is generally detectable in upper respiratory specimens during the acute phase of infection. The lowest concentration of SARS-CoV-2 viral copies this assay can detect is 138 copies/mL. A negative result does not preclude SARS-Cov-2 infection and should not be used as the sole basis for treatment or other patient management decisions. A negative result may occur with  improper specimen collection/handling, submission of specimen other than nasopharyngeal swab, presence of viral mutation(s) within the areas targeted by this assay, and inadequate number of viral  copies(<138 copies/mL). A negative result must be combined with clinical observations, patient history, and epidemiological information. The expected result is Negative.  Fact Sheet for Patients:  EntrepreneurPulse.com.au  Fact Sheet for Healthcare Providers:  IncredibleEmployment.be  This test is no t yet approved or cleared by the [place] FDA and  has been authorized for detection and/or diagnosis of SARS-CoV-2 by FDA under an Emergency Use Authorization (EUA). This EUA will remain  in effect (meaning this test can be used) for the duration of the COVID-19 declaration under Section 564(b)(1) of the Act, 21 U.S.C.section 360bbb-3(b)(1), unless the authorization is terminated  or revoked sooner.       Influenza A by PCR NEGATIVE NEGATIVE Final   Influenza B by PCR NEGATIVE NEGATIVE Final    Comment: (NOTE) The Xpert Xpress SARS-CoV-2/FLU/RSV plus assay is intended as an aid in the diagnosis of influenza from Nasopharyngeal  swab specimens and should not be used as a sole basis for treatment. Nasal washings and aspirates are unacceptable for Xpert Xpress SARS-CoV-2/FLU/RSV testing.  Fact Sheet for Patients: EntrepreneurPulse.com.au  Fact Sheet for Healthcare Providers: IncredibleEmployment.be  This test is not yet approved or cleared by the [place] FDA and has been authorized for detection and/or diagnosis of SARS-CoV-2 by FDA under an Emergency Use Authorization (EUA). This EUA will remain in effect (meaning this test can be used) for the duration of the COVID-19 declaration under Section 564(b)(1) of the Act, 21 U.S.C. section 360bbb-3(b)(1), unless the authorization is terminated or revoked.  Performed at [facility], [place] [address] [place], [place] [address]   Resp Panel by RT-PCR (Flu A&B, Covid) Nasopharyngeal Swab     Status: None   Collection Time: 03/17/21  4:56 PM   Specimen: Nasopharyngeal Swab; Nasopharyngeal(NP) swabs in vial transport medium  Result Value Ref Range Status   SARS Coronavirus 2 by RT PCR NEGATIVE NEGATIVE Final    Comment: (NOTE) SARS-CoV-2 target nucleic acids are NOT DETECTED.  The SARS-CoV-2 RNA is generally detectable in upper respiratory specimens during the acute phase of infection. The lowest concentration of SARS-CoV-2 viral copies this assay can detect is 138 copies/mL. A negative result does not preclude SARS-Cov-2 infection and should not be used as the sole basis for treatment or other patient management decisions. A negative result may occur with  improper specimen collection/handling, submission of specimen other than nasopharyngeal swab, presence of viral mutation(s) within the areas targeted by this assay, and inadequate number of viral copies(<138 copies/mL). A negative result must be combined with clinical observations, patient history, and epidemiological information. The expected result is  Negative.  Fact Sheet for Patients:  EntrepreneurPulse.com.au  Fact Sheet for Healthcare Providers:  IncredibleEmployment.be  This test is no t yet approved or cleared by the [place] FDA and  has been authorized for detection and/or diagnosis of SARS-CoV-2 by FDA under an Emergency Use Authorization (EUA). This EUA will remain  in effect (meaning this test can be used) for the duration of the COVID-19 declaration under Section 564(b)(1) of the Act, 21 U.S.C.section 360bbb-3(b)(1), unless the authorization is terminated  or revoked sooner.       Influenza A by PCR NEGATIVE NEGATIVE Final   Influenza B by PCR NEGATIVE NEGATIVE Final    Comment: (NOTE) The Xpert Xpress SARS-CoV-2/FLU/RSV plus assay is intended as an aid in the diagnosis of influenza from Nasopharyngeal swab specimens and should not be used as a sole basis for treatment. Nasal washings and aspirates are unacceptable for  Xpert Xpress SARS-CoV-2/FLU/RSV testing.  Fact Sheet for Patients: EntrepreneurPulse.com.au  Fact Sheet for Healthcare Providers: IncredibleEmployment.be  This test is not yet approved or cleared by the [place] FDA and has been authorized for detection and/or diagnosis of SARS-CoV-2 by FDA under an Emergency Use Authorization (EUA). This EUA will remain in effect (meaning this test can be used) for the duration of the COVID-19 declaration under Section 564(b)(1) of the Act, 21 U.S.C. section 360bbb-3(b)(1), unless the authorization is terminated or revoked.  Performed at [facility], [address] [place], [place] [address]      Time coordinating discharge: Over 30 minutes  SIGNED:   Nicolette Bang, MD  [facility] 03/18/2021, 3:53 PM Pager   If 7PM-7AM, please contact night-coverage [email or web address] Password [email or web address]

## 2021-03-18 NOTE — ED Notes (Signed)
Patient sleeping at this time.  No signs of distress noted.  Family at bedside states "she has gotten good rest."

## 2021-03-18 NOTE — Care Management CC44 (Addendum)
Condition Code 44 Documentation Completed  Patient Details  Name: SHOLANDA CROSON MRN: [id number] Date of Birth: 12-19-1932   Condition Code 44 given:  Yes, Verbal notice given to spouse via telephone. Patient signature on Condition Code 44 notice:  Yes Documentation of 2 MD's agreement:  Yes Code 44 added to claim:  Yes    Anselm Pancoast, RN 03/18/2021, 3:30 PM

## 2021-03-18 NOTE — Care Management Obs Status (Signed)
[place] NOTIFICATION   Patient Details  Name: HAIDE KLINKER MRN: [id number] Date of Birth: July 31, 1932   Medicare Observation Status Notification Given:  Yes    Anselm Pancoast, RN 03/18/2021, 3:30 PM

## 2021-03-21 ENCOUNTER — Telehealth: Payer: Self-pay | Admitting: Cardiovascular Disease

## 2021-03-21 ENCOUNTER — Inpatient Hospital Stay: Payer: Medicare Other | Admitting: Family Medicine

## 2021-03-21 ENCOUNTER — Telehealth: Payer: Self-pay | Admitting: Family Medicine

## 2021-03-21 NOTE — Telephone Encounter (Signed)
Tammy called in from [facility] stating that the family contacted them for hospice services and want to know if provider will be the attendant

## 2021-03-21 NOTE — Telephone Encounter (Signed)
Spoke with [facility] about request for pt.  Told they will have Tammy call back.  Need to relay Dr. Synthia Innocent message.

## 2021-03-21 NOTE — Telephone Encounter (Signed)
Please call daughter to discuss BP readings 10/16- 97/54 HR 82, 113/56 HR 81, 115/54 HR 59 10/17- 111/56 HR 60, 98/57 HR 80 Please call to discuss

## 2021-03-21 NOTE — Telephone Encounter (Signed)
Spoke with pt's daughter, Taylor Castaneda (DPR approved).  Pt was discharged from [facility] 10/15 cardiology saw for a fib w/ RVR and had some hypotension while in hospital. Pt was to STOP Amlodipine and Losartan and START Diltiazem 120 mg daily, Coreg was incr 25 mg BID.   Taylor Castaneda reports BP/HR's see below.  "Pt does sleep a lot any way, but seems to sleep more." Does have some dizziness upon getting out of bed.  No other s/s reported. Otherwise, Taylor Castaneda feels pt is stable.  Taylor Castaneda states she just wanted to run these readings by provider to make sure it is where they want BP/HR.   Confirmed pt is taking meds per med list and discharge.  Pt has stopped Amlodipine and Losartan.   [name] to try to make sure pt is staying hydrated as well as possible, Taylor Castaneda does report that is difficult.  And to have pt change positions slowly, sit on side of bed for a couple minutes before standing.  [name] to continue to monitor pt's BP/HR. Contact our office if her SBP is consistently <100 or if dizziness continues/worsens. Also if HR >100 d/t a fib.  Advised pt may also be sleeping more after 2 hospital visits in one week, however, if she is feeling worse to please let us know.  Notified Taylor Castaneda that I will send this to [place], [place] who saw pt in hospital to make sure no further recc.  Taylor Castaneda appreciative and has no further questions/concerns.

## 2021-03-21 NOTE — Telephone Encounter (Signed)
Yes I will be attending. Thank you.

## 2021-03-22 NOTE — Telephone Encounter (Signed)
Spoke with [place] notifying her Dr. Darnell Level agrees to be attending for pt.  She verbalizes understanding and states she had received his response.

## 2021-03-28 ENCOUNTER — Ambulatory Visit: Payer: Medicare Other | Admitting: Podiatry

## 2021-03-29 ENCOUNTER — Ambulatory Visit (INDEPENDENT_AMBULATORY_CARE_PROVIDER_SITE_OTHER): Admitting: Family Medicine

## 2021-03-29 ENCOUNTER — Encounter: Payer: Self-pay | Admitting: Family Medicine

## 2021-03-29 ENCOUNTER — Other Ambulatory Visit: Payer: Self-pay

## 2021-03-29 VITALS — BP 124/62 | HR 67 | Temp 97.3°F | Ht 61.0 in | Wt 108.0 lb

## 2021-03-29 DIAGNOSIS — N1832 Chronic kidney disease, stage 3b: Secondary | ICD-10-CM | POA: Diagnosis not present

## 2021-03-29 DIAGNOSIS — Z66 Do not resuscitate: Secondary | ICD-10-CM

## 2021-03-29 DIAGNOSIS — I4891 Unspecified atrial fibrillation: Secondary | ICD-10-CM | POA: Diagnosis not present

## 2021-03-29 DIAGNOSIS — I10 Essential (primary) hypertension: Secondary | ICD-10-CM

## 2021-03-29 DIAGNOSIS — E118 Type 2 diabetes mellitus with unspecified complications: Secondary | ICD-10-CM | POA: Diagnosis not present

## 2021-03-29 DIAGNOSIS — Z794 Long term (current) use of insulin: Secondary | ICD-10-CM

## 2021-03-29 DIAGNOSIS — Z515 Encounter for palliative care: Secondary | ICD-10-CM

## 2021-03-29 LAB — CBC WITH DIFFERENTIAL/PLATELET
Basophils Absolute: 0 10*3/uL (ref 0.0–0.1)
Basophils Relative: 0.5 % (ref 0.0–3.0)
Eosinophils Absolute: 0.2 10*3/uL (ref 0.0–0.7)
Eosinophils Relative: 1.8 % (ref 0.0–5.0)
HCT: 32.2 % — ABNORMAL LOW (ref 36.0–46.0)
Hemoglobin: 11 g/dL — ABNORMAL LOW (ref 12.0–15.0)
Lymphocytes Relative: 13.4 % (ref 12.0–46.0)
Lymphs Abs: 1.2 10*3/uL (ref 0.7–4.0)
MCHC: 34 g/dL (ref 30.0–36.0)
MCV: 94 fl (ref 78.0–100.0)
Monocytes Absolute: 0.7 10*3/uL (ref 0.1–1.0)
Monocytes Relative: 8 % (ref 3.0–12.0)
Neutro Abs: 6.9 10*3/uL (ref 1.4–7.7)
Neutrophils Relative %: 76.3 % (ref 43.0–77.0)
Platelets: 198 10*3/uL (ref 150.0–400.0)
RBC: 3.43 Mil/uL — ABNORMAL LOW (ref 3.87–5.11)
RDW: 12.9 % (ref 11.5–15.5)
WBC: 9.1 10*3/uL (ref 4.0–10.5)

## 2021-03-29 LAB — BASIC METABOLIC PANEL
BUN: 23 mg/dL (ref 6–23)
CO2: 23 mEq/L (ref 19–32)
Calcium: 9 mg/dL (ref 8.4–10.5)
Chloride: 107 mEq/L (ref 96–112)
Creatinine, Ser: 1.09 mg/dL (ref 0.40–1.20)
GFR: 45.92 mL/min — ABNORMAL LOW (ref >60.00)
Glucose, Bld: 191 mg/dL — ABNORMAL HIGH (ref 70–99)
Potassium: 3.9 mEq/L (ref 3.5–5.1)
Sodium: 138 mEq/L (ref 135–145)

## 2021-03-29 NOTE — Assessment & Plan Note (Deleted)
Appreciate cards care.  Now on low dose eliquis.

## 2021-03-29 NOTE — Assessment & Plan Note (Signed)
Recent issues with hypotension, now off amlodipine and losartan, only on cardizem and carvedilol - continue this.

## 2021-03-29 NOTE — Patient Instructions (Signed)
Good to see you today You are doing well Labs today  Continue current medicines.

## 2021-03-29 NOTE — Assessment & Plan Note (Signed)
Established with [facility].

## 2021-03-29 NOTE — Assessment & Plan Note (Signed)
Recent A1c and cbg log adequate for age, only on lantus 8u daily.

## 2021-03-29 NOTE — Progress Notes (Signed)
Patient ID: Taylor Castaneda, female    DOB: [birth year], 87 y.o.   MRN: [id number]  This visit was conducted in person.  BP 124/62 (BP Location: Left Arm, Patient Position: Sitting, Cuff Size: Normal)   Pulse 67   Temp (!) 97.3 F (36.3 C) (Temporal)   Ht 5\' 1"  (1.549 m)   Wt 108 lb (49 kg)   SpO2 94%   BMI 20.41 kg/m    CC: hosp f/u visit  Subjective:   HPI: Taylor Castaneda is a 87 y.o. female presenting on 03/29/2021 for Hospitalization Follow-up   Here with son Hoy Morn.   Recent hospitalization for afib with RVR associated with hypotension.  Hospital records reviewed. Med rec performed.  Amlodipine and losartan were discontinued, diltiazem was started 120mg  daily, coreg was increased to 25mg  bid. Temporarily placed on digoxin.   Since home, feeling well, not fatigued.  Set up with palliative care/hospice - it seems family doesn't want to discuss hospice care in front of patient.   They bring BP and sugar log which will be scanned - overall stable readings these past few weeks. BP 120-150/60s, cbg 120-230. She takes lantus 8u at bedtime.  Lab Results  Component Value Date   HGBA1C 7.7 (H) 02/24/2021  Continues seeing endo (Dr balan).   Recent R MCA ACA watershed infarct treated with DAPT through 09/02/2020, now only on plavix. Known mild MR and aortic sclerosis without stenosis on latest echocardiogram 05/2020. Known PAD  Home health not set up.  Other follow up appointments scheduled: cardiology Friday Isaac Laud [place] PA) ____________________________________________________________________ Hospital admission: 03/17/2021 Hospital discharge: 03/18/2021 TCM f/u phone call: not performed   Admitted From: Observation Disposition: home   Discharge Diagnoses:  Principal Problem:   Atrial fibrillation with RVR ([place]) Active Problems:   Controlled diabetes mellitus type 2 with complications ([place])   Essential hypertension   CKD (chronic kidney disease) stage 3, GFR 30-59 ml/min  (HCC)   Seizure ([place])   DNR (do not resuscitate)  Recommendations for Outpatient Follow-up:  Follow up with PCP in 1-2 weeks Please obtain BMP/CBC in one week Please follow up on the following pending results:   Home Health:No Equipment/Devices:no new equip  Discharge Condition:Stable CODE STATUS:DNR Diet recommendation: Diabetic diet     Relevant past medical, surgical, family and social history reviewed and updated as indicated. Interim medical history since our last visit reviewed. Allergies and medications reviewed and updated. Outpatient Medications Prior to Visit  Medication Sig Dispense Refill   apixaban (ELIQUIS) 2.5 MG TABS tablet Take 1 tablet (2.5 mg total) by mouth 2 (two) times daily. 60 tablet 0   atorvastatin (LIPITOR) 40 MG tablet Take 1 tablet (40 mg total) by mouth daily at 6 PM. 90 tablet 3   Biotin 5000 MCG TABS Take by mouth.     Carboxymethylcellul-Glycerin 1-0.25 % SOLN Place 1-2 drops into both eyes 3 (three) times daily as needed (for dry/irritated eyes.).     carvedilol (COREG) 25 MG tablet Take 1 tablet (25 mg total) by mouth 2 (two) times daily with a meal. 60 tablet 0   Cholecalciferol (VITAMIN D3) 25 MCG (1000 UT) CAPS Take 1 capsule (1,000 Units total) by mouth daily. 30 capsule 0   Cinnamon 500 MG capsule Take 1,000 mg by mouth daily.     clopidogrel (PLAVIX) 75 MG tablet Take 1 tablet (75 mg total) by mouth daily. 90 tablet 4   colchicine 0.6 MG tablet Take 1 tablet (0.6 mg total) by  mouth daily as needed (gout flare). First day of gout flare, may take 1 tablet twice daily. 30 tablet 3   CONTOUR NEXT TEST test strip 1 each 4 (four) times daily.     diclofenac Sodium (VOLTAREN) 1 % GEL Apply 2 g topically 4 (four) times daily. (Patient taking differently: Apply 2 g topically 4 (four) times daily. As needed) 2 g 0   diltiazem (CARDIZEM CD) 120 MG 24 hr capsule Take 1 capsule (120 mg total) by mouth daily. 30 capsule 11   Ferrous Sulfate (IRON) 325 (65  Fe) MG TABS Take 1 tablet (325 mg total) by mouth See admin instructions. Take 325 mg by mouth in the evening on Mondays and Thursdays only 30 tablet 0   hydrALAZINE (APRESOLINE) 25 MG tablet Take 1 tablet (25 mg total) by mouth 2 (two) times daily as needed (BP > 160/100). 60 tablet 0   insulin glargine (LANTUS) 100 UNIT/ML Solostar Pen Inject 8 Units into the skin daily. 15 mL 11   levETIRAcetam (KEPPRA) 750 MG tablet Take 1 tablet (750 mg total) by mouth 2 (two) times daily. 180 tablet 4   Multiple Vitamins-Minerals (PRESERVISION AREDS 2 PO) Take 1 capsule by mouth 2 (two) times daily.     nystatin cream (MYCOSTATIN) Apply 1 application topically 2 (two) times daily. 80 g 0   pantoprazole (PROTONIX) 40 MG tablet Take 1 tablet by mouth twice daily (Patient taking differently: Take 40 mg by mouth daily.) 180 tablet 0   acetaminophen (TYLENOL) 325 MG tablet Take 325-650 mg by mouth every 6 (six) hours as needed (for pain). (Patient not taking: Reported on 03/29/2021)     No facility-administered medications prior to visit.     Per HPI unless specifically indicated in ROS section below Review of Systems  Objective:  BP 124/62 (BP Location: Left Arm, Patient Position: Sitting, Cuff Size: Normal)   Pulse 67   Temp (!) 97.3 F (36.3 C) (Temporal)   Ht 5\' 1"  (1.549 m)   Wt 108 lb (49 kg)   SpO2 94%   BMI 20.41 kg/m   Wt Readings from Last 3 Encounters:  03/29/21 108 lb (49 kg)  03/14/21 112 lb 7 oz (51 kg)  03/10/21 104 lb 4 oz (47.3 kg)      Physical Exam Vitals and nursing note reviewed.  Constitutional:      Appearance: Normal appearance. She is not ill-appearing.     Comments: Sitting in wheelchair  HENT:     Head: Normocephalic and atraumatic.  Eyes:     Extraocular Movements: Extraocular movements intact.     Pupils: Pupils are equal, round, and reactive to light.  Cardiovascular:     Rate and Rhythm: Normal rate and regular rhythm.     Pulses: Normal pulses.     Heart  sounds: Normal heart sounds. No murmur heard. Pulmonary:     Effort: Pulmonary effort is normal. No respiratory distress.     Breath sounds: Normal breath sounds. No wheezing, rhonchi or rales.  Musculoskeletal:     Right lower leg: No edema.     Left lower leg: No edema.  Skin:    General: Skin is warm and dry.     Findings: No rash.  Neurological:     Mental Status: She is alert.  Psychiatric:        Mood and Affect: Mood normal.        Behavior: Behavior normal.       Echo 03/2021 -  LVEF 60-65%, no WMA, mildly dilated LA, rivial MR, mild AI  Assessment & Plan:  This visit occurred during the SARS-CoV-2 public health emergency.  Safety protocols were in place, including screening questions prior to the visit, additional usage of staff PPE, and extensive cleaning of exam room while observing appropriate contact time as indicated for disinfecting solutions.   Problem List Items Addressed This Visit     DNR (do not resuscitate) (Chronic)   Controlled diabetes mellitus type 2 with complications ([place])    Recent A1c and cbg log adequate for age, only on lantus 8u daily.       Essential hypertension    Recent issues with hypotension, now off amlodipine and losartan, only on cardizem and carvedilol - continue this.       CKD (chronic kidney disease) stage 3, GFR 30-59 ml/min (HCC)    Update Cr on current antihypertensive regimen.       Relevant Orders   CBC with Differential/Platelet   Basic metabolic panel   Atrial fibrillation with RVR ([place]) - Primary    Appreciate cards care.  Now on low dose eliquis.       Hospice care patient    Established with [facility].        No orders of the defined types were placed in this encounter.  Orders Placed This Encounter  Procedures   CBC with Differential/Platelet   Basic metabolic panel     Patient Instructions  Good to see you today You are doing well Labs today  Continue current medicines.   Follow up  plan: No follow-ups on file.  [name], MD

## 2021-03-29 NOTE — Assessment & Plan Note (Signed)
Appreciate cards care.  Now on low dose eliquis.

## 2021-03-29 NOTE — Assessment & Plan Note (Signed)
Update Cr on current antihypertensive regimen.

## 2021-03-31 ENCOUNTER — Other Ambulatory Visit: Payer: Self-pay

## 2021-03-31 ENCOUNTER — Ambulatory Visit: Payer: Medicare Other | Admitting: Physician Assistant

## 2021-03-31 ENCOUNTER — Encounter: Payer: Self-pay | Admitting: Medical

## 2021-03-31 ENCOUNTER — Ambulatory Visit: Admitting: Medical

## 2021-03-31 VITALS — BP 108/60 | HR 64 | Ht 60.0 in | Wt 109.1 lb

## 2021-03-31 DIAGNOSIS — Z8673 Personal history of transient ischemic attack (TIA), and cerebral infarction without residual deficits: Secondary | ICD-10-CM

## 2021-03-31 DIAGNOSIS — I10 Essential (primary) hypertension: Secondary | ICD-10-CM

## 2021-03-31 DIAGNOSIS — I480 Paroxysmal atrial fibrillation: Secondary | ICD-10-CM

## 2021-03-31 DIAGNOSIS — I4891 Unspecified atrial fibrillation: Secondary | ICD-10-CM

## 2021-03-31 NOTE — Progress Notes (Signed)
Cardiology Office Note:    Date:  03/31/2021   ID:  Taylor Castaneda, DOB 11/05/1932, MRN [id number]  PCP:  [name], MD  [facility] Cardiologist:  Quay Burow, MD  [place] Electrophysiologist:  None   Referring MD: [name], MD   Chief Complaint: Hospital follow-up  History of Present Illness:    Taylor Castaneda is a 87 y.o. female with a hx of with a hx of HTN, HLD, prior toabacco use, DM2, CKD stage 3, seizure disorder, memory loss, anemia, CVA on Plavix, paroxysmal afib on low dose Eliquis, colon caner s/p resection who is being seen 03/17/2021 for the evaluation of Afib.   Followed by Dr. Gwenlyn Found.  Stress test 2013 as normal with EF 78%. She was lost to follow-up. Saw Dr. Gwenlyn Found 03/2019 via telemedicine visit as a new patient for afib. She had no symptoms at that time. Reportedly had a grand mal seizure and was hospitalized for 3 days. During hospitalization she went into afib and spontaneously converted to SR. She was not placed on oral a/c due to fall risk. She was maintained on coreg and lotensin for BP control.    She was hospitalized on 05/2020 for seizure, found to have watershed CVA on MRI.  She last followed up with neurology in 07/25/2020, transitioning to Plavix 75 mg daily starting 09/02/2020 due to severe intracranial stenosis and Lipitor 40 mg daily.   Patient recently seen in the hospital  03/15/21 by cardiology for Afib/flutter RVR, heart rates 109-140s. HR improved with cardizem drip,  She was started on Eliquis 2.5mg BID given elevated stroke risk. Coreg was increased. Plavix continued for h/o stroke.  Admitted 10/14-10/15 for afib. She was hypotensive and losartan and amlodipine stopped. Heart rates elevated and she was started on diltazem 120 and coreg was continued.   Today, the patient reports she has been doing well since getting home. A litle dizzy the first few days. BP log shows BP 130-150s/60-70s.Some pressures are intermittently low. HR  in the 60s. No chest pain, sob, LLE, orthopnea, pnd, palpitations. No bleeding issues with Eliquis. Still on Plavix from neurology, would recommend seeing neurology. EKG shows NSR 64bpm. She has hydralazine to use as needed, but does not use it.   Past Medical History:  Diagnosis Date   Anemia    Cerebral infarction ([place]) 05/2020   Diabetes mellitus, type II ([place])    Glaucoma    Dr.Hecker   History of colon cancer 1998   Dr. Lennie Hummer   Hyperlipemia    Osteopenia 02/2012, 03/2014   DEXA hip -2.2   Renal insufficiency    Seizures (HCC)    Stenosing tenosynovitis of finger of left hand 2016   index - s/p steroid injection x2    Past Surgical History:  Procedure Laterality Date   ABI  11/2013   WNL   APPENDECTOMY     BILIARY DILATION  05/31/2020   Procedure: BILIARY DILATION;  Surgeon: Jackquline Denmark, MD;  Location: [facility] ENDOSCOPY;  Service: Endoscopy;;   BIOPSY  05/31/2020   Procedure: BIOPSY;  Surgeon: Jackquline Denmark, MD;  Location[facility] ENDOSCOPY;  Service: Endoscopy;;   CARPAL TUNNEL RELEASE     Bilateral    CATARACT EXTRACTION Bilateral 2006   Implants   CHOLECYSTECTOMY N/A 06/01/2020   Procedure: LAPAROSCOPIC CHOLECYSTECTOMY;  Surgeon: Clovis Riley, MD;  Location: [facility] OR;  Service: General;  Laterality: N/A;   [place]   For cancer   COLONOSCOPY  2008  Dr.Stark, Due 2011   COLONOSCOPY  06/2013   1 hyperplastic polyp, ileocecal anastomosis [facility])   dexa  02/2012, 03/2014   T score -2.2 at hip overall stable   ENDOSCOPIC RETROGRADE CHOLANGIOPANCREATOGRAPHY (ERCP) WITH PROPOFOL N/A 05/31/2020   Procedure: ENDOSCOPIC RETROGRADE CHOLANGIOPANCREATOGRAPHY (ERCP) WITH PROPOFOL;  Surgeon: Jackquline Denmark, MD;  Location: [facility] ENDOSCOPY;  Service: Endoscopy;  Laterality: N/A;   REFRACTIVE SURGERY     REMOVAL OF STONES  05/31/2020   Procedure: REMOVAL OF STONES;  Surgeon: Jackquline Denmark, MD;  Location[facility] ENDOSCOPY;  Service: Endoscopy;;   SPHINCTEROTOMY  05/31/2020   Procedure:  [name];  Surgeon: Jackquline Denmark, MD;  Location: [facility];  Service: Endoscopy;;   TONSILLECTOMY AND ADENOIDECTOMY     TOTAL KNEE ARTHROPLASTY Left 12/17/2016   Procedure: LEFT TOTAL KNEE ARTHROPLASTY;  Surgeon: Gaynelle Arabian, MD;  Location: [facility] ORS;  Service: Orthopedics;  Laterality: Left;    Current Medications: Current Meds  Medication Sig   apixaban (ELIQUIS) 2.5 MG TABS tablet Take 1 tablet (2.5 mg total) by mouth 2 (two) times daily.   atorvastatin (LIPITOR) 40 MG tablet Take 1 tablet (40 mg total) by mouth daily at 6 PM.   Biotin 5000 MCG TABS Take by mouth.   Carboxymethylcellul-Glycerin 1-0.25 % SOLN Place 1-2 drops into both eyes 3 (three) times daily as needed (for dry/irritated eyes.).   carvedilol (COREG) 25 MG tablet Take 1 tablet (25 mg total) by mouth 2 (two) times daily with a meal.   Cholecalciferol (VITAMIN D3) 25 MCG (1000 UT) CAPS Take 1 capsule (1,000 Units total) by mouth daily.   Cinnamon 500 MG capsule Take 1,000 mg by mouth daily.   clopidogrel (PLAVIX) 75 MG tablet Take 1 tablet (75 mg total) by mouth daily.   colchicine 0.6 MG tablet Take 1 tablet (0.6 mg total) by mouth daily as needed (gout flare). First day of gout flare, may take 1 tablet twice daily.   CONTOUR NEXT TEST test strip 1 each 4 (four) times daily.   diclofenac Sodium (VOLTAREN) 1 % GEL Apply 2 g topically 4 (four) times daily. (Patient taking differently: Apply 2 g topically 4 (four) times daily. As needed)   diltiazem (CARDIZEM CD) 120 MG 24 hr capsule Take 1 capsule (120 mg total) by mouth daily.   Ferrous Sulfate (IRON) 325 (65 Fe) MG TABS Take 1 tablet (325 mg total) by mouth See admin instructions. Take 325 mg by mouth in the evening on Mondays and Thursdays only   hydrALAZINE (APRESOLINE) 25 MG tablet Take 1 tablet (25 mg total) by mouth 2 (two) times daily as needed (BP > 160/100).   insulin glargine (LANTUS) 100 UNIT/ML Solostar Pen Inject 8 Units into the skin daily.    levETIRAcetam (KEPPRA) 750 MG tablet Take 1 tablet (750 mg total) by mouth 2 (two) times daily.   Multiple Vitamins-Minerals (PRESERVISION AREDS 2 PO) Take 1 capsule by mouth 2 (two) times daily.   nystatin cream (MYCOSTATIN) Apply 1 application topically 2 (two) times daily.   pantoprazole (PROTONIX) 40 MG tablet Take 1 tablet by mouth twice daily (Patient taking differently: Take 40 mg by mouth daily.)     Allergies:   Gabapentin, Sertraline, and Pravastatin sodium   Social History   Socioeconomic History   Marital status: Widowed    Spouse name: Not on file   Number of children: 4   Years of education: Not on file   Highest education level: High school graduate  Occupational History  Occupation: retired    Fish farm manager: Retired    Comment: Regulatory affairs officer  Tobacco Use   Smoking status: Former    Years: 2.00    Types: Cigarettes    Quit date: 06/04/1973    Years since quitting: 47.8   Smokeless tobacco: Never   Tobacco comments:    Smoked for 5 years 1965-1970 up to 1/2 pp week  Vaping Use   Vaping Use: Never used  Substance and Sexual Activity   Alcohol use: Not Currently   Drug use: Never   Sexual activity: Never  Other Topics Concern   Not on file  Social History Narrative   Lives with son Ulysees Barns: retired Regulatory affairs officer   Activity: walking   Diet: good water, fruits/vegetables daily   Caffeine- some tea, occas soda   Social Determinants of Radio broadcast assistant Strain: Low Risk    Difficulty of Paying Living Expenses: Not very hard  Food Insecurity: Not on file  Transportation Needs: Not on file  Physical Activity: Not on file  Stress: Not on file  Social Connections: Not on file     Family History: The patient's family history includes Breast cancer in her mother and sister; Cancer in her mother and sister; Diabetes in her father and sister; Heart attack (age of onset: 14) in her father; Heart disease in her father; Hypertension in her father and son;  Peripheral vascular disease in her sister; Stroke (age of onset: 3) in her father. There is no history of COPD, Asthma, Colon cancer, Esophageal cancer, Rectal cancer, or Stomach cancer.  ROS:   Please see the history of present illness.     All other systems reviewed and are negative.  EKGs/Labs/Other Studies Reviewed:    The following studies were reviewed today:  Echo 03/2021 1. Patient in afib during study.   2. Left ventricular ejection fraction, by estimation, is 60 to 65%. The  left ventricle has normal function. The left ventricle has no regional  wall motion abnormalities. Left ventricular diastolic parameters are  indeterminate.   3. Right ventricular systolic function is normal. The right ventricular  size is normal.   4. Left atrial size was mildly dilated.   5. The mitral valve is normal in structure. Trivial mitral valve  regurgitation. No evidence of mitral stenosis.   6. The aortic valve is tricuspid. Aortic valve regurgitation is mild.  Mild to moderate aortic valve sclerosis/calcification is present, without  any evidence of aortic stenosis.   7. The inferior vena cava is normal in size with greater than 50%  respiratory variability, suggesting right atrial pressure of 3 mmHg.   EKG:  EKG is  ordered today.  The ekg ordered today demonstrates NSR, 64bpm, TWI III  Recent Labs: 02/24/2021: ALT 15 03/14/2021: TSH 4.261 03/17/2021: Magnesium 2.1 03/29/2021: BUN 23; Creatinine, Ser 1.09; Hemoglobin 11.0; Platelets 198.0; Potassium 3.9; Sodium 138  Recent Lipid Panel    Component Value Date/Time   CHOL 126 02/24/2021 0955   TRIG 134.0 02/24/2021 0955   HDL 38.70 (L) 02/24/2021 0955   CHOLHDL 3 02/24/2021 0955   VLDL 26.8 02/24/2021 0955   LDLCALC 60 02/24/2021 0955   LDLDIRECT 103.0 02/18/2018 0936     Physical Exam:    VS:  BP 108/60 (BP Location: Left Arm, Patient Position: Sitting, Cuff Size: Normal)   Pulse 64   Ht 5' (1.524 m)   Wt 109 lb 2 oz  (49.5 kg)   SpO2 98%   BMI 21.31  kg/m     Wt Readings from Last 3 Encounters:  03/31/21 109 lb 2 oz (49.5 kg)  03/29/21 108 lb (49 kg)  03/14/21 112 lb 7 oz (51 kg)     GEN:  Well nourished, well developed in no acute distress HEENT: Normal NECK: No JVD; No carotid bruits LYMPHATICS: No lymphadenopathy CARDIAC: RRR, no murmurs, rubs, gallops RESPIRATORY:  Clear to auscultation without rales, wheezing or rhonchi  ABDOMEN: Soft, non-tender, non-distended MUSCULOSKELETAL:  No edema; No deformity  SKIN: Warm and dry NEUROLOGIC:  Alert and oriented x 3 PSYCHIATRIC:  Normal affect   ASSESSMENT:    1. Paroxysmal A-fib ([place])   2. Transient atrial fibrillation ([place])   3. Essential hypertension   4. History of stroke    PLAN:    In order of problems listed above:  PAF EKG today shows SR. She is on Eliquis 2.5mg  BID for stroke ppx. Denies bleeding issues. Continue diltiazem 120mg  and Coreg 25mg  BID.   H/o ischemic CVA Patient is on Plavix with Eliquis, recommended follow-up with neurology to discuss stopping Plavix.   HTN Pressures labile by home log. Today BP 108/60. No changes today. She was having symptomatic hypotension in the ER so caution with antihypertensives. Can reassess in 3 months.   Disposition: Follow up in 3 month(s) with MD/APP    Signed, Lasonya Hubner Ninfa Meeker, PA-C  03/31/2021 2:21 PM    [facility]

## 2021-03-31 NOTE — Patient Instructions (Signed)
Medication Instructions:  Your physician recommends that you continue on your current medications as directed. Please refer to the Current Medication list given to you today.  *If you need a refill on your cardiac medications before your next appointment, please call your pharmacy*  Lab Work: None If you have labs (blood work) drawn today and your tests are completely normal, you will receive your results only by: [place] (if you have MyChart) OR A paper copy in the mail If you have any lab test that is abnormal or we need to change your treatment, we will call you to review the results.   Follow-Up: At [facility], you and your health needs are our priority.  As part of our continuing mission to provide you with exceptional heart care, we have created designated Provider Care Teams.  These Care Teams include your primary Cardiologist (physician) and Advanced Practice Providers (APPs -  Physician Assistants and Nurse Practitioners) who all work together to provide you with the care you need, when you need it.   Your next appointment:   3 month(s)  The format for your next appointment:   In Person  Provider:   You may see Minna Merritts, MD or one of the following Advanced Practice Providers on your designated Care Team:   Murray Hodgkins, NP Christell Faith, PA-C Cadence Kathlen Mody, [place]

## 2021-04-03 ENCOUNTER — Ambulatory Visit: Payer: Medicare Other | Admitting: Cardiovascular Disease

## 2021-04-11 ENCOUNTER — Encounter: Payer: Self-pay | Admitting: Family Medicine

## 2021-04-11 DIAGNOSIS — Z8673 Personal history of transient ischemic attack (TIA), and cerebral infarction without residual deficits: Secondary | ICD-10-CM

## 2021-04-12 ENCOUNTER — Telehealth: Payer: Self-pay

## 2021-04-12 NOTE — Progress Notes (Addendum)
Chronic Care Management Pharmacy Assistant   Name: Taylor Castaneda  MRN: [id number] DOB: 1933/01/24  Reason for Encounter: CCM (General Adherence)   Recent office visits:  03/29/2021 - Taylor Bush, MD - Patient presented for hospital follow up for Atrial Fibrillation. Labs: BMP and CBC. Stop as patient is not taking: acetaminophen (TYLENOL) 325 MG tablet. 03/16/2021 - Taylor Bush, MD - Patient Message - Stop: Amlodipine as Carvedilol was increased. May restart if blood pressure starts running too high.  03/13/2021 - Taylor Bush, MD - Patient message - Stop: sertraline (ZOLOFT) 25 MG tablet due to patient sleeping a lot, being dazed and shaking.  03/10/2021 - Taylor Bush, MD - Patient presented for Annual Wellness Visit. Referral to Dermatology. Start: sertraline (ZOLOFT) 25 MG tablet. 02/07/2021 Taylor Bush, MD - Telephone - Increase Losartan to 100mg  daily due to high blood pressure at home.   Recent consult visits:  03/31/2021 - Cardiology - Patient presented for Paroxysmal A-Fib. Labs: EKG  Hospital visits:  Medication Reconciliation was completed by comparing discharge summary, patient's EMR and Pharmacy list, and upon discussion with patient.  Admitted to the hospital on 03/14/2021 due to Tachycardia. Discharge date was 03/20/2021. Discharged from [place]?Medications Started at [facility] Discharge:?? -started apixaban (ELIQUIS) 2.5 MG TABS  Medication Changes at Hospital Discharge: -Changed carvedilol (COREG) 25 MG tablet  Medications Discontinued at Hospital Discharge: -Stopped - None noted  Medications that remain the same after Hospital Discharge:??  -All other medications will remain the same.    Medication Reconciliation was completed by comparing discharge summary, patient's EMR and Pharmacy list, and upon discussion with patient.  Admitted to the hospital on 03/17/2021 due to Atrial Fibrillation with RVR.  Discharge date was 03/18/2021. Discharged from [place]?Medications Started at [facility] Discharge:?? -started diltiazem (CARDIZEM CD) 120 MG 24 hr capsule  Medication Changes at Hospital Discharge: -Changed: Coreg 25 mg BID  Medications Discontinued at Hospital Discharge: -Stopped amLODipine (NORVASC) 5 MG tablet -Stopped losartan (COZAAR) 100 MG tablet  Medications that remain the same after Hospital Discharge:??  -All other medications will remain the same.     Medications: Outpatient Encounter Medications as of 04/12/2021  Medication Sig Note   apixaban (ELIQUIS) 2.5 MG TABS tablet Take 1 tablet (2.5 mg total) by mouth 2 (two) times daily.    atorvastatin (LIPITOR) 40 MG tablet Take 1 tablet (40 mg total) by mouth daily at 6 PM.    Biotin 5000 MCG TABS Take by mouth.    Carboxymethylcellul-Glycerin 1-0.25 % SOLN Place 1-2 drops into both eyes 3 (three) times daily as needed (for dry/irritated eyes.).    carvedilol (COREG) 25 MG tablet Take 1 tablet (25 mg total) by mouth 2 (two) times daily with a meal.    Cholecalciferol (VITAMIN D3) 25 MCG (1000 UT) CAPS Take 1 capsule (1,000 Units total) by mouth daily.    Cinnamon 500 MG capsule Take 1,000 mg by mouth daily.    clopidogrel (PLAVIX) 75 MG tablet Take 1 tablet (75 mg total) by mouth daily.    colchicine 0.6 MG tablet Take 1 tablet (0.6 mg total) by mouth daily as needed (gout flare). First day of gout flare, may take 1 tablet twice daily.    CONTOUR NEXT TEST test strip 1 each 4 (four) times daily.    diclofenac Sodium (VOLTAREN) 1 % GEL Apply 2 g topically 4 (four) times daily. (Patient taking differently: Apply 2 g  topically 4 (four) times daily. As needed)    diltiazem (CARDIZEM CD) 120 MG 24 hr capsule Take 1 capsule (120 mg total) by mouth daily.    Ferrous Sulfate (IRON) 325 (65 Fe) MG TABS Take 1 tablet (325 mg total) by mouth See admin instructions. Take 325 mg by mouth in the evening on Mondays and  Thursdays only    hydrALAZINE (APRESOLINE) 25 MG tablet Take 1 tablet (25 mg total) by mouth 2 (two) times daily as needed (BP > 160/100). 03/17/2021: Pt has not started yet   insulin glargine (LANTUS) 100 UNIT/ML Solostar Pen Inject 8 Units into the skin daily.    levETIRAcetam (KEPPRA) 750 MG tablet Take 1 tablet (750 mg total) by mouth 2 (two) times daily.    Multiple Vitamins-Minerals (PRESERVISION AREDS 2 PO) Take 1 capsule by mouth 2 (two) times daily.    nystatin cream (MYCOSTATIN) Apply 1 application topically 2 (two) times daily.    pantoprazole (PROTONIX) 40 MG tablet Take 1 tablet by mouth twice daily (Patient taking differently: Take 40 mg by mouth daily.)    No facility-administered encounter medications on file as of 04/12/2021.    Contacted Taylor Castaneda on 04/12/2021 for general disease state and medication adherence call.   Patient is not > 5 days past due for refill on the following medications per chart history:  [facility] Medications: Medication Name/mg Last Fill Days Supply Atorvastatin 40 mg  02/07/2021 90 Losartan 50mg   02/07/2021      90  What concerns do you have about your medications? Spoke with patient's son; he doesn't have any concerns at the moment; his sister Taylor Castaneda) is in charge of the patient's medications Taylor Castaneda will fill the pillbox and Taylor Castaneda administers them).   The patient denies side effects with her medications.   How often do you forget or accidentally miss a dose? Never  Do you use a pillbox? Yes  Are you having any problems getting your medications from your pharmacy? No  Has the cost of your medications been a concern? No  Since last visit with CPP, no interventions have been made: keep taking current medication.   The patient has had an ED visit since last contact.   The patient reports the following problems with their health. Patient's son states patient is now on Hospice; they have a nurse that comes in to monitor patient's vitals, etc.    she denies  concerns or questions for Taylor Castaneda, Pharm. D at this time.   Care Gaps: Annual wellness visit in last year? Yes Most Recent BP reading: 108/60 on 03/31/2021  If Diabetic: Most recent A1C reading: 7.7 on 02/24/2021 Last eye exam / retinopathy screening: Up to date Last diabetic foot exam: 11/25/2018   No appointments scheduled within the next 30 days.  Taylor Castaneda, CPP notified  Marijean Niemann, [place] Clinical Pharmacy Assistant [phone]  I have reviewed the care management and care coordination activities outlined in this encounter and I am certifying that I agree with the content of this note. Patient un-enrolled from CCM due to hospice care. No further action required.  Taylor Castaneda, PharmD Clinical Pharmacist [facility] [phone]

## 2021-04-13 NOTE — Addendum Note (Signed)
Addended by: [name] on: 04/13/2021 11:37 AM   Modules accepted: Orders

## 2021-04-13 NOTE — Telephone Encounter (Signed)
Noted! Thank you

## 2021-05-02 ENCOUNTER — Encounter: Payer: Self-pay | Admitting: Family Medicine

## 2021-05-02 MED ORDER — CARVEDILOL 25 MG PO TABS: 25.0000 mg | ORAL_TABLET | Freq: Two times a day (BID) | ORAL | 6 refills | Status: DC

## 2021-05-02 NOTE — Addendum Note (Signed)
Addended by: [name] on: 05/02/2021 05:12 PM   Modules accepted: Orders

## 2021-05-02 NOTE — Telephone Encounter (Signed)
Carvedilol Last rx: 03/16/21, #60 Last OV:  03/29/21,  hosp f/u Next OV:  09/08/21, 6 mo f/u

## 2021-05-26 IMAGING — MR MR HEAD W/O CM
10 series · 48 of 48 positions shown · non-contrast
Comparison: CT and CT perfusion from yesterday

CLINICAL DATA: Altered mental status with unclear cause

EXAM:
MRI HEAD WITHOUT CONTRAST
TECHNIQUE: Multiplanar, multiecho pulse sequences of the brain and surrounding
structures were obtained without intravenous contrast.

[Series 5: DWI · axial · 3.0mm · 0.88mm/px · z∈[-92,+43]mm · 12 of 92 slices shown (1 of 4)]
[im 1/92]
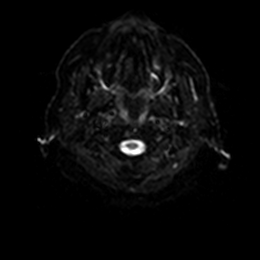
[im 9/92]
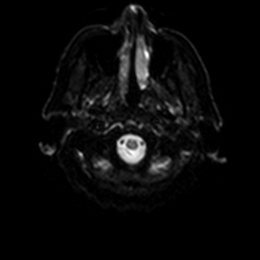
[im 17/92]
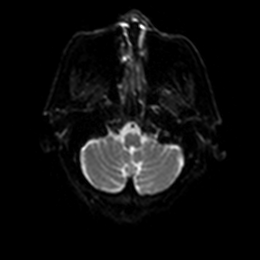
[im 25/92]
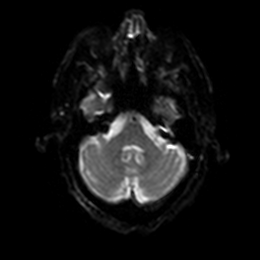
[im 34/92]
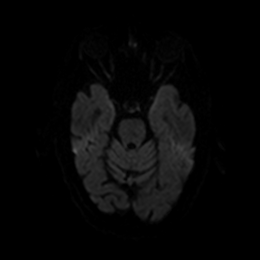
[im 42/92]
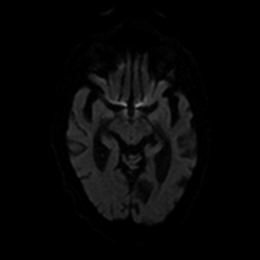
[im 50/92]
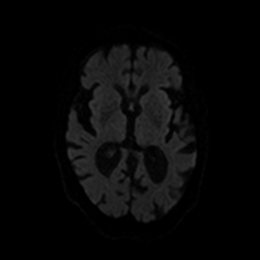
[im 58/92]
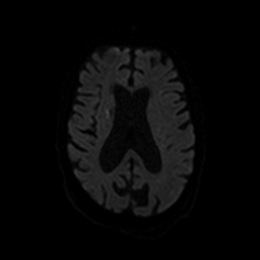
[im 67/92]
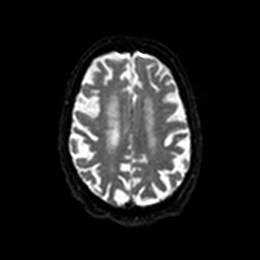
[im 75/92]
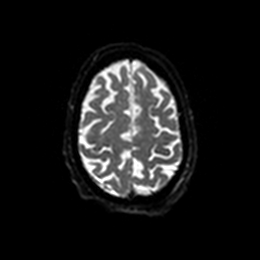
[im 83/92]
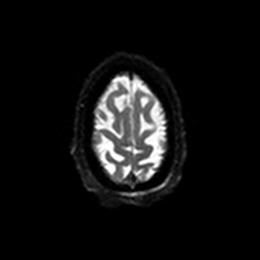
[im 92/92]
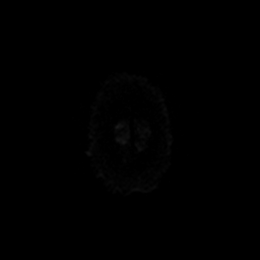

[Series 6: DWI · axial · 3.0mm · 0.88mm/px · z∈[-92,+43]mm · 6 of 46 slices shown (2 of 4)]
[im 1/46]
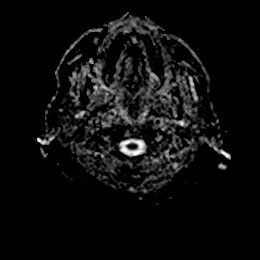
[im 10/46]
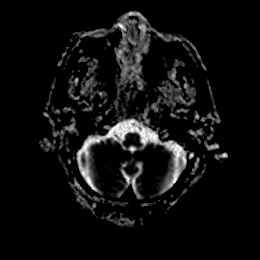
[im 19/46]
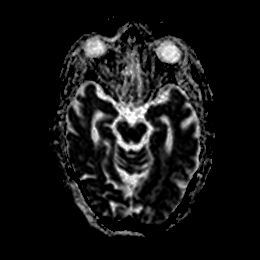
[im 28/46]
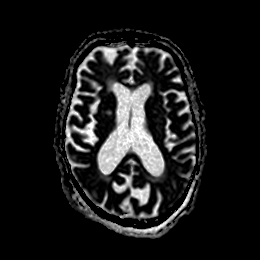
[im 37/46]
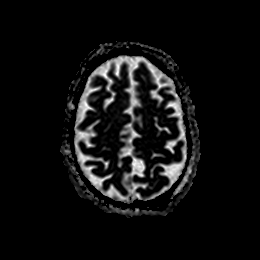
[im 46/46]
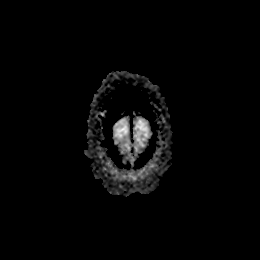

[Series 7: DWI · coronal · 4.0mm · 0.88mm/px · 8 of 64 slices shown (3 of 4)]
[im 1/64]
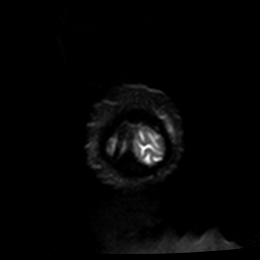
[im 10/64]
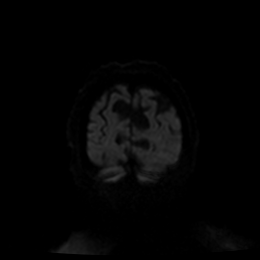
[im 19/64]
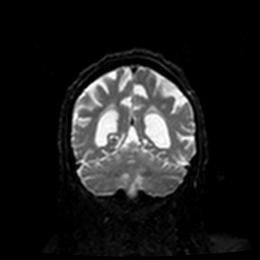
[im 28/64]
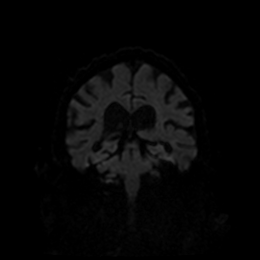
[im 37/64]
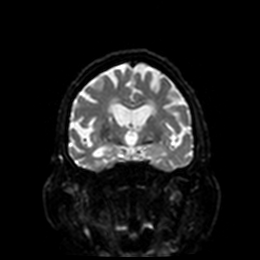
[im 46/64]
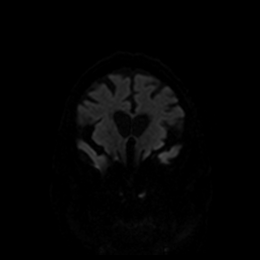
[im 55/64]
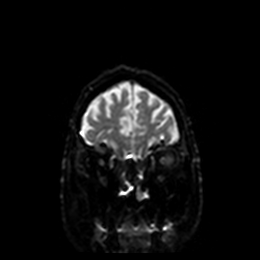
[im 64/64]
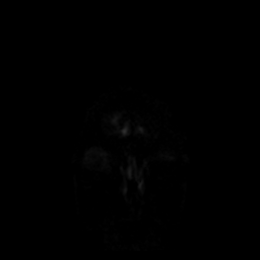

[Series 8: DWI · coronal · 4.0mm · 0.88mm/px · 4 of 32 slices shown (4 of 4)]
[im 1/32]
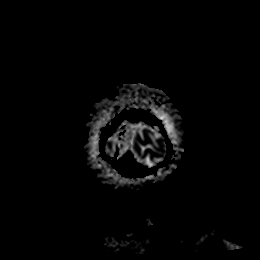
[im 11/32]
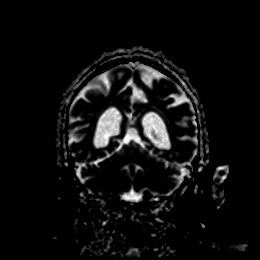
[im 21/32]
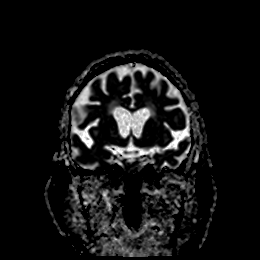
[im 32/32]
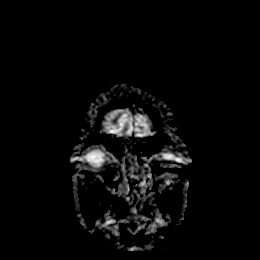

[Series 9: T1 · sagittal · 5.0mm · 0.75mm/px · 3 of 23 slices shown]
[im 1/23]
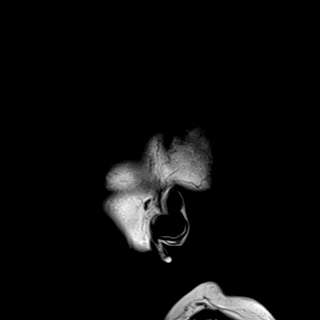
[im 12/23]
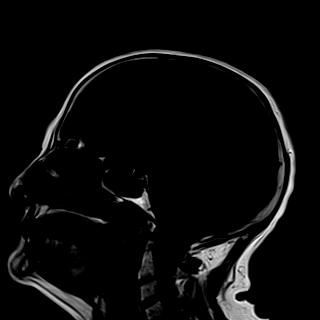
[im 23/23]
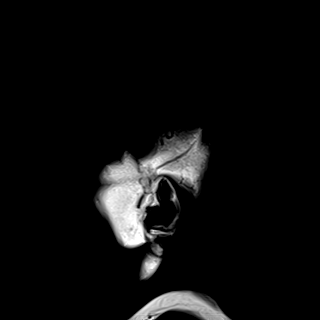

[Series 10: T2 · axial · 5.0mm · 0.72mm/px · z∈[-97,+47]mm · 3 of 25 slices shown (1 of 2)]
[im 1/25]
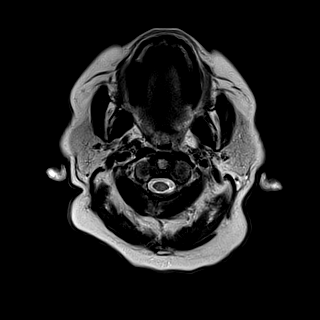
[im 13/25]
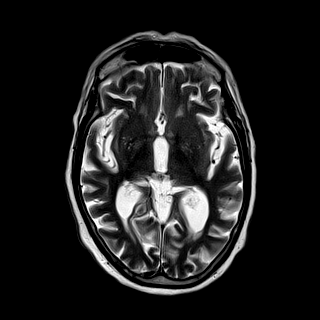
[im 25/25]
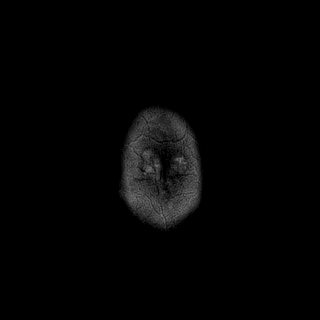

[Series 11: FLAIR · axial · 5.0mm · 0.90mm/px · z∈[-99,+45]mm · 3 of 25 slices shown (1 of 2)]
[im 1/25]
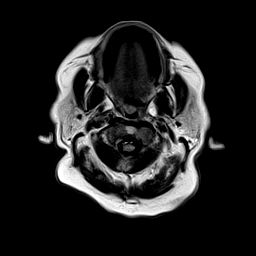
[im 13/25]
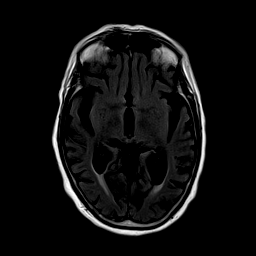
[im 25/25]
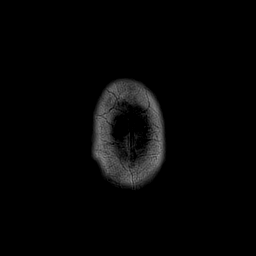

[Series 12: ax hemo · axial · 5.0mm · 0.86mm/px · z∈[-99,+44]mm · 3 of 25 slices shown]
[im 1/25]
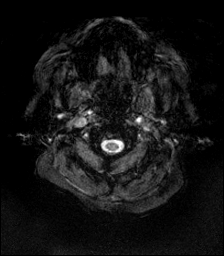
[im 13/25]
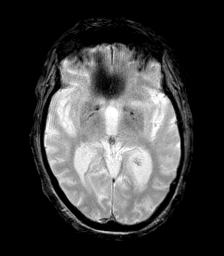
[im 25/25]
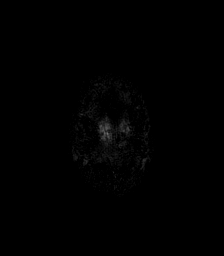

[Series 13: FLAIR · axial · 5.0mm · 0.90mm/px · z∈[-98,+46]mm · 3 of 25 slices shown (2 of 2)]
[im 1/25]
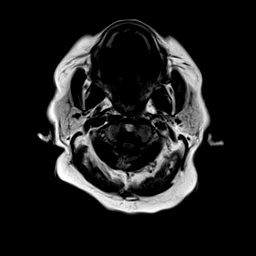
[im 13/25]
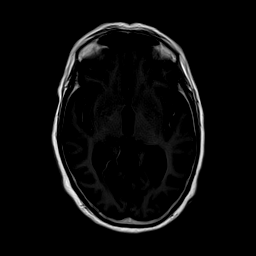
[im 25/25]
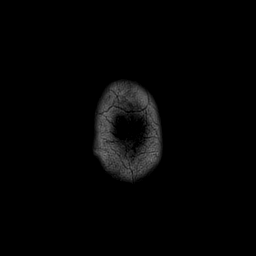

[Series 14: T2 · coronal · 5.0mm · 0.72mm/px · 3 of 28 slices shown (2 of 2)]
[im 1/28]
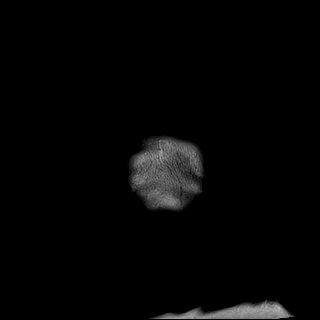
[im 14/28]
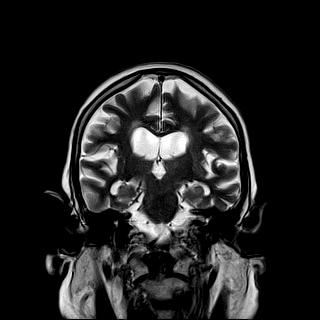
[im 28/28]
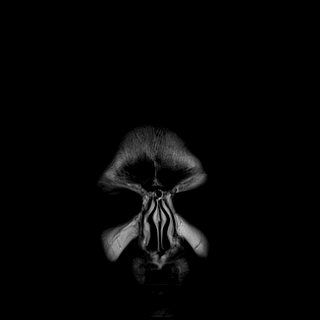

[48 of 48 positions shown; findings below may reference images not displayed]

FINDINGS: Brain: No acute infarction, hemorrhage, hydrocephalus, extra-axial
collection or mass lesion.

Advanced cerebral atrophy with generalized cortical thinning.

Mild small vessel ischemic gliosis in the cerebral white matter.
Remote perforator infarct in the right corona radiata.

Vascular: Normal flow voids

Skull and upper cervical spine: Normal marrow signal

Sinuses/Orbits: Negative
IMPRESSION: 1. No acute finding.
2. Advanced cerebral atrophy.

## 2021-06-30 ENCOUNTER — Other Ambulatory Visit: Payer: Self-pay

## 2021-06-30 ENCOUNTER — Emergency Department

## 2021-06-30 ENCOUNTER — Ambulatory Visit: Payer: Medicare Other | Admitting: Cardiovascular Disease

## 2021-06-30 ENCOUNTER — Encounter: Payer: Self-pay | Admitting: Emergency Medicine

## 2021-06-30 ENCOUNTER — Emergency Department
Admission: EM | Admit: 2021-06-30 | Discharge: 2021-06-30 | Disposition: A | Discharge status: Home / Self Care | Attending: Emergency Medicine | Admitting: Emergency Medicine

## 2021-06-30 DIAGNOSIS — W010XXA Fall on same level from slipping, tripping and stumbling without subsequent striking against object, initial encounter: Secondary | ICD-10-CM | POA: Insufficient documentation

## 2021-06-30 DIAGNOSIS — M25552 Pain in left hip: Secondary | ICD-10-CM | POA: Diagnosis not present

## 2021-06-30 DIAGNOSIS — N189 Chronic kidney disease, unspecified: Secondary | ICD-10-CM | POA: Insufficient documentation

## 2021-06-30 DIAGNOSIS — I4891 Unspecified atrial fibrillation: Secondary | ICD-10-CM | POA: Diagnosis not present

## 2021-06-30 DIAGNOSIS — Z794 Long term (current) use of insulin: Secondary | ICD-10-CM | POA: Diagnosis not present

## 2021-06-30 DIAGNOSIS — S32512A Fracture of superior rim of left pubis, initial encounter for closed fracture: Secondary | ICD-10-CM | POA: Diagnosis not present

## 2021-06-30 DIAGNOSIS — S32502A Unspecified fracture of left pubis, initial encounter for closed fracture: Secondary | ICD-10-CM | POA: Diagnosis not present

## 2021-06-30 DIAGNOSIS — Z96642 Presence of left artificial hip joint: Secondary | ICD-10-CM | POA: Diagnosis not present

## 2021-06-30 DIAGNOSIS — S32592A Other specified fracture of left pubis, initial encounter for closed fracture: Secondary | ICD-10-CM | POA: Diagnosis not present

## 2021-06-30 DIAGNOSIS — I129 Hypertensive chronic kidney disease with stage 1 through stage 4 chronic kidney disease, or unspecified chronic kidney disease: Secondary | ICD-10-CM | POA: Insufficient documentation

## 2021-06-30 DIAGNOSIS — S8992XA Unspecified injury of left lower leg, initial encounter: Secondary | ICD-10-CM | POA: Diagnosis not present

## 2021-06-30 DIAGNOSIS — S3993XA Unspecified injury of pelvis, initial encounter: Secondary | ICD-10-CM | POA: Diagnosis not present

## 2021-06-30 MED ORDER — HYDROCODONE-ACETAMINOPHEN 5-325 MG PO TABS
0.5000 | ORAL_TABLET | ORAL | Status: AC
  Administered 2021-06-30: 0.5 via ORAL
  Filled 2021-06-30: qty 1

## 2021-06-30 MED ORDER — HYDROCODONE-ACETAMINOPHEN 5-325 MG PO TABS: 0.5000 | ORAL_TABLET | Freq: Four times a day (QID) | ORAL | 0 refills | Status: AC | PRN

## 2021-06-30 NOTE — ED Provider Notes (Addendum)
[place] EMERGENCY DEPARTMENT Provider Note   CSN: [id number] Arrival date & time: 06/30/21  2952     History  Chief Complaint  Patient presents with   Taylor Castaneda    Taylor Castaneda is a 88 y.o. female with a history of atrial fibrillation, chronic kidney disease and hypertension presents to the ER for evaluation of a fall.  Patient lives with the daughter earlier today, she was getting out of the car and walking with her walker when she tripped and fell backwards landing on her buttocks.  Daughter states she witnessed the fall, there was no head injury, patient fell backwards onto her buttocks.  Patient denies any headache, neck pain, LOC, nausea or vomiting.  Patient complains of left leg pain along the left hip and thigh.  She also has some pain down in the left mid tib-fib region that is less severe.  Patient has had some Tylenol in the emergency department.  Her pain is currently controlled in a wheelchair.  She lives at home with 24/7 care from family.  Patient typically walks with a walker.  No chest pain, shortness of breath or abdominal pain.  HPI     Home Medications Prior to Admission medications   Medication Sig Start Date End Date Taking? Authorizing Provider  apixaban (ELIQUIS) 2.5 MG TABS tablet Take 1 tablet (2.5 mg total) by mouth 2 (two) times daily. 03/15/21   Florencia Reasons, MD  atorvastatin (LIPITOR) 40 MG tablet Take 1 tablet (40 mg total) by mouth daily at 6 PM. 06/23/20   Angiulli, Lavon Paganini, PA-C  Biotin 5000 MCG TABS Take by mouth.    [provider]  Carboxymethylcellul-Glycerin 1-0.25 % SOLN Place 1-2 drops into both eyes 3 (three) times daily as needed (for dry/irritated eyes.).    [provider]  carvedilol (COREG) 25 MG tablet Take 1 tablet (25 mg total) by mouth 2 (two) times daily with a meal. 05/02/21   [name], MD  Cholecalciferol (VITAMIN D3) 25 MCG (1000 UT) CAPS Take 1 capsule (1,000 Units total) by mouth daily.  06/23/20   Angiulli, Lavon Paganini, PA-C  Cinnamon 500 MG capsule Take 1,000 mg by mouth daily.    [provider]  colchicine 0.6 MG tablet Take 1 tablet (0.6 mg total) by mouth daily as needed (gout flare). First day of gout flare, may take 1 tablet twice daily. 07/13/20   [name], MD  CONTOUR NEXT TEST test strip 1 each 4 (four) times daily. 07/12/20   [provider]  diclofenac Sodium (VOLTAREN) 1 % GEL Apply 2 g topically 4 (four) times daily. Patient taking differently: Apply 2 g topically 4 (four) times daily. As needed 06/23/20   Angiulli, Lavon Paganini, PA-C  diltiazem (CARDIZEM CD) 120 MG 24 hr capsule Take 1 capsule (120 mg total) by mouth daily. 03/18/21 03/18/22  Marcell Anger, MD  Ferrous Sulfate (IRON) 325 (65 Fe) MG TABS Take 1 tablet (325 mg total) by mouth See admin instructions. Take 325 mg by mouth in the evening on Mondays and Thursdays only 06/23/20   Angiulli, Lavon Paganini, PA-C  hydrALAZINE (APRESOLINE) 25 MG tablet Take 1 tablet (25 mg total) by mouth 2 (two) times daily as needed (BP > 160/100). 03/10/21   [name], MD  insulin glargine (LANTUS) 100 UNIT/ML Solostar Pen Inject 8 Units into the skin daily. 06/24/20   Angiulli, Lavon Paganini, PA-C  levETIRAcetam (KEPPRA) 750 MG tablet Take 1 tablet (750 mg total) by  mouth 2 (two) times daily. 07/25/20   Penumalli, Earlean Polka, MD  Multiple Vitamins-Minerals (PRESERVISION AREDS 2 PO) Take 1 capsule by mouth 2 (two) times daily.    [provider]  nystatin cream (MYCOSTATIN) Apply 1 application topically 2 (two) times daily. 07/29/20   [name], MD  pantoprazole (PROTONIX) 40 MG tablet Take 1 tablet by mouth twice daily Patient taking differently: Take 40 mg by mouth daily. 11/24/20   [name], MD      Allergies    Gabapentin, Sertraline, and Pravastatin sodium    Review of Systems   Review of Systems  Physical Exam Updated Vital Signs BP (!) 188/95 (BP Location: Left Arm)     Pulse 76    Temp 97.8 F (36.6 C) (Oral)    Resp (!) 21    Ht 5' (1.524 m)    Wt 45.4 kg    SpO2 100%    BMI 19.53 kg/m  Physical Exam Constitutional:      Appearance: She is well-developed.  HENT:     Head: Normocephalic and atraumatic.     Right Ear: External ear normal.     Left Ear: External ear normal.     Nose: Nose normal.  Eyes:     Conjunctiva/sclera: Conjunctivae normal.     Pupils: Pupils are equal, round, and reactive to light.  Cardiovascular:     Rate and Rhythm: Normal rate.  Pulmonary:     Effort: Pulmonary effort is normal. No respiratory distress.     Breath sounds: Normal breath sounds.  Abdominal:     Palpations: Abdomen is soft.     Tenderness: There is no abdominal tenderness.  Musculoskeletal:        General: No deformity.     Cervical back: Normal range of motion.     Comments: Mild pain with left hip internal rotation.  No tenderness palpation throughout the left thigh, knee.  She is able to straight leg raise.  She has minimal pain to palpation of the left mid tib-fib region.  No pain with palpation of the ankle.  Ankle plantarflexion dorsiflexion is intact.  Leg lengths are equal with no externally rotation of the left lower extremity.  Nontender along the cervical thoracic and lumbar spinous process.  No tenderness to both shoulders or clavicles.  Skin:    General: Skin is warm and dry.     Findings: No rash.  Neurological:     Mental Status: She is alert and oriented to person, place, and time.     Cranial Nerves: No cranial nerve deficit.     Coordination: Coordination normal.  Psychiatric:        Behavior: Behavior normal.    ED Results / Procedures / Treatments   Labs (all labs ordered are listed, but only abnormal results are displayed) Labs Reviewed - No data to display  EKG None  Radiology DG Pelvis 1-2 Views  Result Date: 06/30/2021 CLINICAL DATA:  Trauma, fall EXAM: PELVIS - 1-2 VIEW COMPARISON:  None. FINDINGS: Comminuted fractures  are seen in the body superior ramus and inferior ramus of left pubic bone. There is slight overriding of fracture fragments. No other definite fractures are seen. Degenerative changes are noted in the lumbar spine. There is 2 cm calcific density in the left paraspinal region. Possibility of left renal calculus is not excluded. IMPRESSION: Recent comminuted fractures are seen in the body, superior ramus and inferior ramus of left pubic bone with some overriding of fracture fragments.  Lumbar spondylosis.  Possible left renal calculus. Electronically Signed   By: Elmer Picker M.D.   On: 06/30/2021 19:32   DG Tibia/Fibula Left  Result Date: 06/30/2021 CLINICAL DATA:  Trauma, fall EXAM: LEFT TIBIA AND FIBULA - 2 VIEW COMPARISON:  None. FINDINGS: No recent fracture or dislocation is seen in the left tibia and fibula. There is previous left knee arthroplasty. Degenerative changes are noted in the dorsal aspect of talonavicular joint. Coarse calcification at the attachment of Achilles tendon to the calcaneus suggests calcific tendinosis. IMPRESSION: No recent fracture or dislocation is seen in the left tibia and fibula. Previous left knee arthroplasty. Electronically Signed   By: Elmer Picker M.D.   On: 06/30/2021 19:29   DG Femur Min 2 Views Left  Result Date: 06/30/2021 CLINICAL DATA:  Left hip pain after fall. EXAM: LEFT FEMUR 2 VIEWS COMPARISON:  None. FINDINGS: Mildly displaced fractures are seen involving the left superior and inferior pubic rami. Status post left total knee arthroplasty. No fracture is seen involving the left femur. IMPRESSION: Mildly displaced left superior and inferior pubic rami fractures. No femoral fracture is noted. Electronically Signed   By: Marijo Conception M.D.   On: 06/30/2021 19:28    Procedures Procedures   Medications Ordered in ED Medications - No data to display  ED Course/ Medical Decision Making/ A&P                           Medical Decision  Making Amount and/or Complexity of Data Reviewed Radiology: ordered.  Risk Prescription drug management.   88 year old female with fall earlier today.  She suffered a mechanical fall, falling backwards landing on her buttocks.  Pain improved with Tylenol.  X-rays ordered and reviewed by me today show no femur or tib-fib fracture but she does have a left superior and inferior pubic rami fracture with minimal displacement.  Patient was able to stand and walk with assistance with a walker.  Family members feel as if they will be able to care for her mom at home.  We discussed possibility of admission to the hospital for pain control and physical therapy but family prefers patient to be at home and they do feel as if they are able to care for her at home given her current condition.  They understand signs and symptoms return to the ER for.  Patient lives at home with family members with 24/7 care. Final Clinical Impression(s) / ED Diagnoses Final diagnoses:  Closed fracture of multiple pubic rami, left, initial encounter [facility])    Rx / [facility] Orders ED Discharge Orders     None         Duanne Guess, PA-C 06/30/21 2144    Duanne Guess, PA-C 06/30/21 2145    Naaman Plummer, MD 06/30/21 2328

## 2021-06-30 NOTE — ED Triage Notes (Signed)
First RN note:  Pt is currently a hospice patient.  Pt had a fall in the parking lot today and is c/o leg pain over the knee.  Hospice facility coordinator explained they just want an x-ray to determine treatment.

## 2021-06-30 NOTE — ED Triage Notes (Signed)
Pt via POV from home. Pt was getting out of the car. Pt landed on L side. Denies head injury. Pt is on blood thinners. Pt is A&OX4 and NAD. Pt c/o L hip and L leg pain.  Family gave tylenol at 5:30pm.  Family member called Hospice at this time.

## 2021-06-30 NOTE — ED Provider Triage Note (Signed)
Emergency Medicine Provider Triage Evaluation Note  Taylor Castaneda , a 88 y.o. female  was evaluated in triage.  Pt complains of left leg pain after a fall.  Patient was with daughter, they were getting out of the car, patient was standing up to get inside of her walker which she walks with at baseline and patient fell backwards.  Daughter witnessed fall, no head injury.  Patient complaining of left leg pain and unable to bear weight.  History of left total knee..  Review of Systems  Positive: Left leg pain Negative: Headache, neck pain, back or upper extremity discomfort.  No right lower extremity pain  Physical Exam  BP (!) 188/95 (BP Location: Left Arm)    Pulse 76    Temp 97.8 F (36.6 C) (Oral)    Resp (!) 21    SpO2 100%  Gen:   Awake, no distress presents in a wheelchair Resp:  Normal effort  MSK:   Moves extremities without difficulty minimal palpation throughout the left lower leg from the knee down into the ankle but she does have moderate thigh and groin pain with left hip internal rotation.   Medical Decision Making  Medically screening exam initiated at 6:45 PM.  Appropriate orders placed.  JOURNII NIERMAN was informed that the remainder of the evaluation will be completed by another provider, this initial triage assessment does not replace that evaluation, and the importance of remaining in the ED until their evaluation is complete.     Duanne Guess, [place] 06/30/21 1846

## 2021-06-30 NOTE — ED Notes (Signed)
Pt able to stand in walker with assistance but was not able to ambulate forward due to favoring right side. Pt kept tipping to the left and is a fall risk. Gerald Stabs PA notified.

## 2021-06-30 NOTE — Discharge Instructions (Signed)
Please sit on a soft cushion.  Take half to 1 Norco tablet every 6 hours as needed for moderate to severe pain.  You may use Tylenol for mild to moderate pain.  Schedule follow-up appointment with orthopedics.  Return to the ER for any severe pain worsening symptoms or urgent changes in your health

## 2021-07-11 DIAGNOSIS — S32592A Other specified fracture of left pubis, initial encounter for closed fracture: Secondary | ICD-10-CM | POA: Diagnosis not present

## 2021-07-25 ENCOUNTER — Ambulatory Visit: Payer: Medicare Other | Admitting: Family Medicine

## 2021-08-01 ENCOUNTER — Encounter: Payer: Self-pay | Admitting: Family Medicine

## 2021-08-03 ENCOUNTER — Encounter: Payer: Self-pay | Admitting: Family Medicine

## 2021-08-03 MED ORDER — ATORVASTATIN CALCIUM 40 MG PO TABS: 40.0000 mg | ORAL_TABLET | Freq: Every day | ORAL | 2 refills | Status: AC

## 2021-08-03 NOTE — Telephone Encounter (Signed)
E-scribed refills.  

## 2021-08-30 ENCOUNTER — Ambulatory Visit: Admitting: Dermatology

## 2021-08-30 ENCOUNTER — Other Ambulatory Visit: Payer: Self-pay | Admitting: Family Medicine

## 2021-08-30 DIAGNOSIS — D492 Neoplasm of unspecified behavior of bone, soft tissue, and skin: Secondary | ICD-10-CM

## 2021-08-30 DIAGNOSIS — L578 Other skin changes due to chronic exposure to nonionizing radiation: Secondary | ICD-10-CM

## 2021-08-30 DIAGNOSIS — C44712 Basal cell carcinoma of skin of right lower limb, including hip: Secondary | ICD-10-CM

## 2021-08-30 DIAGNOSIS — C44722 Squamous cell carcinoma of skin of right lower limb, including hip: Secondary | ICD-10-CM

## 2021-08-30 MED ORDER — MUPIROCIN 2 % EX OINT
1.0000 | TOPICAL_OINTMENT | Freq: Every day | CUTANEOUS | 1 refills | Status: AC
Start: 2021-08-30 — End: ?

## 2021-08-30 NOTE — Patient Instructions (Addendum)

## 2021-08-30 NOTE — Telephone Encounter (Signed)
Refill request Eliquis ?Last refill 03/15/21 #60 ?Last office visit 03/29/21 ?Upcoming appointment 09/08/21 ? ?Last a message for patient's family to call the office back. Need to confirm that patient is currently taking the medication and who prescribed. ?

## 2021-08-30 NOTE — Progress Notes (Signed)
? ?New Patient Visit ? ?Subjective  ?Taylor Castaneda is a 88 y.o. female who presents for the following: check spot (R lower leg, 62yr, hx of bleeding, growing). ?The patient has spots, moles and lesions to be evaluated, some may be new or changing and the patient has concerns that these could be cancer. ? ?New patient referral from Dr. Shirlyn Goltz. ? ?Patient accompanied by son who contributes to history. ? ?The following portions of the chart were reviewed this encounter and updated as appropriate:  ? Tobacco  Allergies  Meds  Problems  Med Hx  Surg Hx  Fam Hx   ?  ?Review of Systems:  No other skin or systemic complaints except as noted in HPI or Assessment and Plan. ? ?Objective  ?Well appearing patient in no apparent distress; mood and affect are within normal limits. ? ?A focused examination was performed including R lower leg. Relevant physical exam findings are noted in the Assessment and Plan. ? ?Right Lower Leg - Anterior ?2.5cm beefy red papulonodule ? ? ? ? ? ?Assessment & Plan  ?Neoplasm of skin ?Right Lower Leg - Anterior ? ?Skin excision ? ?Lesion length (cm):  2.5 ?Lesion width (cm):  2.5 ?Margin per side (cm):  0.2 ?Total excision diameter (cm):  2.9 ?Informed consent: discussed and consent obtained   ?Timeout: patient name, date of birth, surgical site, and procedure verified   ?Procedure prep:  Patient was prepped and draped in usual sterile fashion ?Prep type:  Isopropyl alcohol and povidone-iodine ?Anesthesia: the lesion was anesthetized in a standard fashion   ?Anesthetic:  1% lidocaine w/ epinephrine 1-100,000 buffered w/ 8.4% NaHCO3 ?Instrument used: #15 blade   ?Hemostasis achieved with: pressure   ?Hemostasis achieved with comment:  Electrocautery ?Outcome: patient tolerated procedure well with no complications   ?Post-procedure details: sterile dressing applied and wound care instructions given   ?Dressing type: bandage, pressure dressing and bacitracin (Mupirocin)    ? ?Destruction of lesion ?Complexity: extensive   ?Destruction method: electrodesiccation and curettage   ?Informed consent: discussed and consent obtained   ?Timeout:  patient name, date of birth, surgical site, and procedure verified ?Procedure prep:  Patient was prepped and draped in usual sterile fashion ?Prep type:  Isopropyl alcohol ?Anesthesia: the lesion was anesthetized in a standard fashion   ?Anesthetic:  1% lidocaine w/ epinephrine 1-100,000 buffered w/ 8.4% NaHCO3 ?Curettage performed in three different directions: Yes   ?Electrodesiccation performed over the curetted area: Yes   ?Lesion length (cm):  2.5 ?Lesion width (cm):  2.5 ?Margin per side (cm):  0.2 ?Final wound size (cm):  2.9 ?Hemostasis achieved with:  pressure, aluminum chloride and electrodesiccation ?Outcome: patient tolerated procedure well with no complications   ?Post-procedure details: sterile dressing applied and wound care instructions given   ?Dressing type: bandage, bacitracin and pressure dressing   ? ?mupirocin ointment (BACTROBAN) 2 % ?Apply 1 application. topically daily. Qd to excision site ? ?Specimen 1 - Surgical pathology ?Differential Diagnosis: D48.5 BCC vs SCC vs other ? ?Check Margins: No ?2.5cm beefy red papulonodule ? ?BCC vs SCC vs other ? ?Start Mupirocin oint qd to wound until healed. ? ?SCC (squamous cell carcinoma), leg, right ? ?Actinic Damage ?- chronic, secondary to cumulative UV radiation exposure/sun exposure over time ?- diffuse scaly erythematous macules with underlying dyspigmentation ?- Recommend daily broad spectrum sunscreen SPF 30+ to sun-exposed areas, reapply every 2 hours as needed.  ?- Recommend staying in the shade or wearing long sleeves, sun glasses (UVA+UVB protection)  and wide brim hats (4-inch brim around the entire circumference of the hat). ?- Call for new or changing lesions. ? ?Return in about 2 months (around 10/30/2021) for recheck bx site. ? ?I, Othelia Pulling, RMA, am acting as scribe  for Sarina Ser, MD . ?Documentation: I have reviewed the above documentation for accuracy and completeness, and I agree with the above. ? ?Sarina Ser, MD ? ?

## 2021-08-31 ENCOUNTER — Encounter: Payer: Self-pay | Admitting: Dermatology

## 2021-08-31 NOTE — Telephone Encounter (Signed)
Pt's daughter, [name] (on dpr), rtn call.  I asked if pt is still taking Eliquis.  [name] confirms pt is and needs the refill.   ?

## 2021-09-04 ENCOUNTER — Telehealth: Payer: Self-pay

## 2021-09-04 NOTE — Telephone Encounter (Signed)
Discussed biopsy results with pt son  ?

## 2021-09-07 ENCOUNTER — Ambulatory Visit: Payer: Medicare Other | Admitting: Dermatology

## 2021-09-08 ENCOUNTER — Ambulatory Visit: Payer: Medicare Other | Admitting: Family Medicine

## 2021-09-15 ENCOUNTER — Ambulatory Visit (INDEPENDENT_AMBULATORY_CARE_PROVIDER_SITE_OTHER): Admitting: Family Medicine

## 2021-09-15 ENCOUNTER — Encounter: Payer: Self-pay | Admitting: Family Medicine

## 2021-09-15 VITALS — BP 156/74 | HR 69 | Temp 97.9°F | Ht 60.0 in | Wt 104.1 lb

## 2021-09-15 DIAGNOSIS — I10 Essential (primary) hypertension: Secondary | ICD-10-CM

## 2021-09-15 DIAGNOSIS — S32502A Unspecified fracture of left pubis, initial encounter for closed fracture: Secondary | ICD-10-CM | POA: Diagnosis not present

## 2021-09-15 DIAGNOSIS — Z515 Encounter for palliative care: Secondary | ICD-10-CM

## 2021-09-15 DIAGNOSIS — Z794 Long term (current) use of insulin: Secondary | ICD-10-CM | POA: Diagnosis not present

## 2021-09-15 DIAGNOSIS — R630 Anorexia: Secondary | ICD-10-CM

## 2021-09-15 DIAGNOSIS — M85851 Other specified disorders of bone density and structure, right thigh: Secondary | ICD-10-CM

## 2021-09-15 DIAGNOSIS — I4891 Unspecified atrial fibrillation: Secondary | ICD-10-CM

## 2021-09-15 DIAGNOSIS — E118 Type 2 diabetes mellitus with unspecified complications: Secondary | ICD-10-CM | POA: Diagnosis not present

## 2021-09-15 DIAGNOSIS — R569 Unspecified convulsions: Secondary | ICD-10-CM

## 2021-09-15 DIAGNOSIS — C44702 Unspecified malignant neoplasm of skin of right lower limb, including hip: Secondary | ICD-10-CM

## 2021-09-15 DIAGNOSIS — G3184 Mild cognitive impairment, so stated: Secondary | ICD-10-CM

## 2021-09-15 DIAGNOSIS — Z8673 Personal history of transient ischemic attack (TIA), and cerebral infarction without residual deficits: Secondary | ICD-10-CM

## 2021-09-15 LAB — POCT GLYCOSYLATED HEMOGLOBIN (HGB A1C): Hemoglobin A1C: 7.1 % — AB (ref 4.0–5.6)

## 2021-09-15 NOTE — Progress Notes (Signed)
? ? Patient ID: Taylor Castaneda, female    DOB: [birth year], 88 y.o.   MRN: [id number] ? ?This visit was conducted in person. ? ?BP (!) 156/74 (BP Location: Right Arm, Cuff Size: Normal)   Pulse 69   Temp 97.9 ?F (36.6 ?C) (Temporal)   Ht 5' (1.524 m)   Wt 104 lb 2 oz (47.2 kg)   SpO2 94%   BMI 20.34 kg/m?   ? ?CC: 6 mo f/u visit  ?Subjective:  ? ?HPI: ?Taylor Castaneda is a 88 y.o. female presenting on 09/15/2021 for Follow-up (Here for f/u.  Pt accompanied by daughter, Taylor Castaneda. ) ? ? ?Living with daughter Taylor Castaneda. Son Taylor Castaneda stays with her during the week.  ?Hospice /palliative care involved -coming out to the house once a week.  ? ?Fall 06/2021 seen at ER diagnosed with comminuted pelvic fractures of body, superior ramus and inferior ramus of L pubic bone without left tibial or fibular or femur or hip fractures on xray. Discharged with hydrocodone 5/325mg  #10. Saw [facility]) in follow up 2 wks ago - healing well. Continues ambulating with the walker.  ? ?Prior hospitalization 2022 for R MCA ACA watershed infarct due to afib - started on eliquis 2.5mg  bid, plavix since stopped. She continues diltiazem and carvedilol.  ? ?She is now off oral iron (constipation needing disimpaction) and off pantoprazole (without worsening reflux/heartburn).  ?Weight gain noted today. May be retaining fluid - she has some fluid pills to use PRN.  ? ?Poor appetite - drinking Premier protein shake at least 1 a day (30gm of protein).  ?   ? ?Relevant past medical, surgical, family and social history reviewed and updated as indicated. Interim medical history since our last visit reviewed. ?Allergies and medications reviewed and updated. ?Outpatient Medications Prior to Visit  ?Medication Sig Dispense Refill  ? atorvastatin (LIPITOR) 40 MG tablet Take 1 tablet (40 mg total) by mouth daily at 6 PM. 90 tablet 2  ? Biotin 5000 MCG TABS Take by mouth.    ? Carboxymethylcellul-Glycerin 1-0.25 % SOLN Place 1-2 drops into  both eyes 3 (three) times daily as needed (for dry/irritated eyes.).    ? carvedilol (COREG) 25 MG tablet Take 1 tablet (25 mg total) by mouth 2 (two) times daily with a meal. 60 tablet 6  ? Cholecalciferol (VITAMIN D3) 25 MCG (1000 UT) CAPS Take 1 capsule (1,000 Units total) by mouth daily. 30 capsule 0  ? Cinnamon 500 MG capsule Take 1,000 mg by mouth daily.    ? colchicine 0.6 MG tablet Take 1 tablet (0.6 mg total) by mouth daily as needed (gout flare). First day of gout flare, may take 1 tablet twice daily. 30 tablet 3  ? CONTOUR NEXT TEST test strip 1 each 4 (four) times daily.    ? diclofenac Sodium (VOLTAREN) 1 % GEL Apply 2 g topically 4 (four) times daily. (Patient taking differently: Apply 2 g topically 4 (four) times daily. As needed) 2 g 0  ? diltiazem (CARDIZEM CD) 120 MG 24 hr capsule Take 1 capsule (120 mg total) by mouth daily. 30 capsule 11  ? ELIQUIS 2.5 MG TABS tablet Take 1 tablet by mouth once daily 90 tablet 0  ? furosemide (LASIX) 20 MG tablet Take 1 tablet (20 mg total) by mouth daily as needed for fluid or edema (weight gain).    ? hydrALAZINE (APRESOLINE) 25 MG tablet Take 1 tablet (25 mg total) by mouth 2 (two) times daily  as needed (BP > 160/100). 60 tablet 0  ? HYDROcodone-acetaminophen (NORCO) 5-325 MG tablet Take 0.5-1 tablets by mouth every 6 (six) hours as needed for moderate pain. 10 tablet 0  ? levETIRAcetam (KEPPRA) 750 MG tablet Take 1 tablet (750 mg total) by mouth 2 (two) times daily. 180 tablet 4  ? Multiple Vitamins-Minerals (PRESERVISION AREDS 2 PO) Take 1 capsule by mouth 2 (two) times daily.    ? mupirocin ointment (BACTROBAN) 2 % Apply 1 application. topically daily. Qd to excision site 22 g 1  ? nystatin cream (MYCOSTATIN) Apply 1 application topically 2 (two) times daily. 80 g 0  ? insulin glargine (LANTUS) 100 UNIT/ML Solostar Pen Inject 8 Units into the skin daily. 15 mL 11  ? insulin glargine (LANTUS) 100 UNIT/ML Solostar Pen Inject 9 Units into the skin daily.    ?  Ferrous Sulfate (IRON) 325 (65 Fe) MG TABS Take 1 tablet (325 mg total) by mouth See admin instructions. Take 325 mg by mouth in the evening on Mondays and Thursdays only 30 tablet 0  ? pantoprazole (PROTONIX) 40 MG tablet Take 1 tablet by mouth twice daily (Patient taking differently: Take 40 mg by mouth daily.) 180 tablet 0  ? ?No facility-administered medications prior to visit.  ?  ? ?Per HPI unless specifically indicated in ROS section below ?Review of Systems ? ?Objective:  ?BP (!) 156/74 (BP Location: Right Arm, Cuff Size: Normal)   Pulse 69   Temp 97.9 ?F (36.6 ?C) (Temporal)   Ht 5' (1.524 m)   Wt 104 lb 2 oz (47.2 kg)   SpO2 94%   BMI 20.34 kg/m?   ?Wt Readings from Last 3 Encounters:  ?09/15/21 104 lb 2 oz (47.2 kg)  ?06/30/21 100 lb (45.4 kg)  ?03/31/21 109 lb 2 oz (49.5 kg)  ?  ?  ?Physical Exam ?Vitals and nursing note reviewed.  ?Constitutional:   ?   Appearance: Normal appearance. She is not ill-appearing.  ?Cardiovascular:  ?   Rate and Rhythm: Normal rate and regular rhythm.  ?   Pulses: Normal pulses.  ?   Heart sounds: Murmur (2/6 systolic USB) heard.  ?Pulmonary:  ?   Effort: Pulmonary effort is normal. No respiratory distress.  ?   Breath sounds: Normal breath sounds. No wheezing, rhonchi or rales.  ?   Comments: No crackles ?Musculoskeletal:  ?   Right lower leg: No edema.  ?   Left lower leg: No edema.  ?Neurological:  ?   Mental Status: She is alert.  ?Psychiatric:     ?   Mood and Affect: Mood normal.     ?   Behavior: Behavior normal.  ? ?   ?Results for orders placed or performed in visit on 09/15/21  ?POCT glycosylated hemoglobin (Hb A1C)  ?Result Value Ref Range  ? Hemoglobin A1C 7.1 (A) 4.0 - 5.6 %  ? HbA1c POC (<> result, manual entry)    ? HbA1c, POC (prediabetic range)    ? HbA1c, POC (controlled diabetic range)    ? ?Lab Results  ?Component Value Date  ? CREATININE 1.09 03/29/2021  ? BUN 23 03/29/2021  ? NA 138 03/29/2021  ? K 3.9 03/29/2021  ? CL 107 03/29/2021  ? CO2 23  03/29/2021  ?  ?Lab Results  ?Component Value Date  ? WBC 9.1 03/29/2021  ? HGB 11.0 (L) 03/29/2021  ? HCT 32.2 (L) 03/29/2021  ? MCV 94.0 03/29/2021  ? PLT 198.0 03/29/2021  ? ?Assessment &  Plan:  ? ?Problem List Items Addressed This Visit   ? ? Controlled diabetes mellitus type 2 with complications ([place])  ?  A1c showing good control in office today only on lantus insulin - we don't need to continue checking as this is followed by endo Dr Chalmers Cater.  ? ?  ?  ? Relevant Medications  ? insulin glargine (LANTUS) 100 UNIT/ML Solostar Pen  ? Other Relevant Orders  ? POCT glycosylated hemoglobin (Hb A1C) (Completed)  ? Essential hypertension  ?  Blood pressure is elevated today however overall adequate for age.  No changes made. ? ?  ?  ? Relevant Medications  ? furosemide (LASIX) 20 MG tablet  ? Osteopenia  ? History of subcortical infarction 07/2020  ?  Continues low-dose Eliquis. ?Plavix and aspirin have been stopped by neurology. ? ?  ?  ? Seizure ([place])  ?  In setting of stroke-continue Keppra. ? ?  ?  ? MCI (mild cognitive impairment)  ?  This worsened after stroke early 2022. ? ?  ?  ? Cancer of skin of right leg  ?  Saw dermatology status post excision with a large residual wound - continue home wound care per dermatology instructions. ? ?  ?  ? Atrial fibrillation with RVR ([place])  ?  Continue low-dose Eliquis given age and weight below 60 kg. ?Recent weight gain noted-continue Lasix. ? ?  ?  ? Relevant Medications  ? furosemide (LASIX) 20 MG tablet  ? Hospice care patient - [facility]  ?  Appreciate hospice care.  ?Lives with daughter, son watches her during the day.  ? ?  ?  ? Pelvic fracture (HCC)  ?  S/p fall 06/2021 with subsequent L pubic fracture, followed by [facility] and healing well. ?She has previously declined repeat bone density scan ? ?  ?  ? Anorexia  ?  Poor appetite persists-continue Premier protein shake. ? ?  ?  ?  ? ?No orders of the defined types were placed in this encounter. ? ?Orders Placed This  Encounter  ?Procedures  ? POCT glycosylated hemoglobin (Hb A1C)  ? ? ?Patient Instructions  ?Sugars are doing well! ?You are doing well today. ?Continue current medicines ?Return in 6 months for physical.  ? ?Follow up

## 2021-09-15 NOTE — Patient Instructions (Signed)
Sugars are doing well! ?You are doing well today. ?Continue current medicines ?Return in 6 months for physical.  ?

## 2021-09-16 DIAGNOSIS — R630 Anorexia: Secondary | ICD-10-CM | POA: Insufficient documentation

## 2021-09-16 DIAGNOSIS — S329XXA Fracture of unspecified parts of lumbosacral spine and pelvis, initial encounter for closed fracture: Secondary | ICD-10-CM | POA: Insufficient documentation

## 2021-09-16 NOTE — Assessment & Plan Note (Addendum)
Appreciate hospice care.  ?Lives with daughter, son watches her during the day.  ?

## 2021-09-16 NOTE — Assessment & Plan Note (Addendum)
S/p fall 06/2021 with subsequent L pubic fracture, followed by [facility] and healing well. ?She has previously declined repeat bone density scan ?

## 2021-09-16 NOTE — Assessment & Plan Note (Addendum)
Saw dermatology status post excision with a large residual wound - continue home wound care per dermatology instructions. ?

## 2021-09-16 NOTE — Assessment & Plan Note (Signed)
Poor appetite persists-continue Premier protein shake. ?

## 2021-09-16 NOTE — Assessment & Plan Note (Addendum)
A1c showing good control in office today only on lantus insulin - we don't need to continue checking as this is followed by endo Dr Chalmers Cater.  ?

## 2021-09-16 NOTE — Assessment & Plan Note (Signed)
Blood pressure is elevated today however overall adequate for age.  No changes made. ?

## 2021-09-16 NOTE — Assessment & Plan Note (Signed)
In setting of stroke-continue Keppra. ?

## 2021-09-16 NOTE — Assessment & Plan Note (Signed)
Continues low-dose Eliquis. ?Plavix and aspirin have been stopped by neurology. ?

## 2021-09-16 NOTE — Assessment & Plan Note (Signed)
This worsened after stroke early 2022. ?

## 2021-09-16 NOTE — Assessment & Plan Note (Addendum)
Continue low-dose Eliquis given age and weight below 60 kg. ?Recent weight gain noted-continue Lasix. ?

## 2021-10-10 ENCOUNTER — Telehealth: Payer: Self-pay | Admitting: Family Medicine

## 2021-10-10 MED ORDER — AMOXICILLIN-POT CLAVULANATE 875-125 MG PO TABS: 1.0000 | ORAL_TABLET | Freq: Two times a day (BID) | ORAL | 0 refills | Status: AC

## 2021-10-10 NOTE — Telephone Encounter (Signed)
Taylor Castaneda notified as instructed by telephone and verbalized understanding. Taylor Castaneda confirmed that patient does not have a fever. ?

## 2021-10-10 NOTE — Telephone Encounter (Signed)
Plz triage pt.  °

## 2021-10-10 NOTE — Telephone Encounter (Signed)
Left v/m for Anderson Malta to cb to [facility] to speak with triage. ?

## 2021-10-10 NOTE — Telephone Encounter (Signed)
I just went in to addend the note when I remembered I did not put no fever. Sorry and thank you. ?

## 2021-10-10 NOTE — Telephone Encounter (Signed)
I spoke with [facility] nurse and she saw pt this morning and there was a huge change in pt. BP 113/60 P 115-131 (usually pts BP is high) pts lungs sound wet but pt has a dry cough, o2 sat 89 % room air. Symptoms started on 10/07/21.pt was much stronger last wk than today. Anderson Malta not sure if transition to death or respiratory infection or pneumonia. Anderson Malta said she did not think pt was strong enough to come to office and wanted to ask if Dr G wants to start pt on abx. Anderson Malta request cb after reviewed by Dr Darnell Level. Sending note to Dr Darnell Level and [name] CMA.and will teams lisa.[place] ?

## 2021-10-10 NOTE — Addendum Note (Signed)
Addended by: [name] on: 10/10/2021 02:03 PM ? ? Modules accepted: Orders ? ?

## 2021-10-10 NOTE — Telephone Encounter (Signed)
Taylor Castaneda with Taylor Castaneda [phone] Believes pt has pnemonia or an upper respitory infection and for the nurse to call her back so she can give some details on pts position ?

## 2021-10-10 NOTE — Telephone Encounter (Addendum)
Any fever? ?Reasonable to try antibiotic augmentin which I have sent to the pharmacy ?Keep [place] updated with condition.  ?

## 2021-10-13 ENCOUNTER — Other Ambulatory Visit: Payer: Self-pay | Admitting: Family Medicine

## 2021-10-28 ENCOUNTER — Other Ambulatory Visit: Payer: Self-pay | Admitting: Diagnostic Neuroimaging

## 2021-11-01 ENCOUNTER — Other Ambulatory Visit: Payer: Self-pay | Admitting: Family Medicine

## 2021-11-01 MED ORDER — LEVETIRACETAM 750 MG PO TABS: 750.0000 mg | ORAL_TABLET | Freq: Two times a day (BID) | ORAL | 11 refills | Status: AC

## 2021-11-01 NOTE — Progress Notes (Signed)
Received note from [facility] for keppra refill. Pt no longer seeing neurology. Request 15d supply - refilled.

## 2021-11-02 ENCOUNTER — Ambulatory Visit: Admitting: Dermatology

## 2021-11-10 ENCOUNTER — Telehealth: Payer: Self-pay | Admitting: Family Medicine

## 2021-11-10 MED ORDER — LORAZEPAM 0.5 MG PO TABS: 0.5000 mg | ORAL_TABLET | Freq: Three times a day (TID) | ORAL | 1 refills | Status: DC | PRN

## 2021-11-10 NOTE — Addendum Note (Signed)
Addended by: [name] on: 11/10/2021 05:28 PM   Modules accepted: Orders

## 2021-11-10 NOTE — Telephone Encounter (Signed)
Nurse Hoyle Sauer from [place] called to get new script filled for patient, Lorazapam 0.5mg  every 4 hours PRN. [facility] on [address]. Call back [phone] Main line for [facility]. Please advise

## 2021-11-10 NOTE — Telephone Encounter (Addendum)
Hospice patient. Plz notify I've sent this in for patient.  This is a new Rx - how is she taking it?

## 2021-11-13 NOTE — Telephone Encounter (Signed)
Rtn call to [facility].  Lvm asking her to call back. Need to get requested info, per Dr. Synthia Innocent message.

## 2021-11-13 NOTE — Telephone Encounter (Signed)
I received a prior auth fax from [place] my meds for Lorazepam 0.5 MG tablets.  When I went on Cover my meds to start the PA, it says prescription reversal by [place].  Please let me know if I should start a new PA or disregard this PA altogether.

## 2021-11-14 ENCOUNTER — Telehealth: Payer: Self-pay | Admitting: Family Medicine

## 2021-11-14 NOTE — Telephone Encounter (Signed)
Returning call about patient. Please call back.

## 2021-11-14 NOTE — Telephone Encounter (Signed)
Did they have any trouble with getting med filled?  If not, I think likely ok to disregard PA. Thanks.

## 2021-11-14 NOTE — Telephone Encounter (Addendum)
No problems filling med. No PA needed. Fyi to [facility].

## 2021-11-14 NOTE — Telephone Encounter (Signed)
See 11/10/21 phn note.

## 2021-11-14 NOTE — Telephone Encounter (Signed)
Taylor Castaneda rtn call (see 11/14/21 phn note).    Spoke with Taylor Castaneda notifying her Dr. Darnell Level sent lorazepam to pharmacy.  Verbalizes understanding.  States med is new rx under [facility] orders.  They are crushing tabs and mixing with a little water to give pt q4h.

## 2021-11-14 NOTE — Telephone Encounter (Signed)
Rtn call to [facility].  Lvm asking her to call back. Need to get requested info, per Dr. Synthia Innocent message.

## 2021-11-26 ENCOUNTER — Other Ambulatory Visit: Payer: Self-pay | Admitting: Family Medicine

## 2022-02-09 ENCOUNTER — Other Ambulatory Visit: Payer: Self-pay | Admitting: Family Medicine

## 2022-02-09 NOTE — Telephone Encounter (Signed)
Refill request Lorazepam Last office visit 09/15/21 Last refill 11/10/21 #30/1

## 2022-02-09 NOTE — Telephone Encounter (Signed)
ERx 

## 2022-02-21 ENCOUNTER — Ambulatory Visit: Payer: Medicare Other

## 2022-02-21 ENCOUNTER — Telehealth: Payer: Self-pay | Admitting: Family Medicine

## 2022-02-21 NOTE — Telephone Encounter (Signed)
Dee from [facility] called in to ask when would Dr Darnell Level be able to sign and send back the death certificate that was sent in on behalf of the deceased patient?

## 2022-02-21 NOTE — Telephone Encounter (Signed)
Plz notify I filled out death certificate.   I also spoke with daughter to express my condolences.

## 2022-02-22 NOTE — Telephone Encounter (Signed)
Noted  

## 2022-03-04 DEATH — deceased

## 2022-04-02 ENCOUNTER — Encounter (INDEPENDENT_AMBULATORY_CARE_PROVIDER_SITE_OTHER): Payer: Self-pay
